# Patient Record
Sex: Female | Born: 1955 | Race: Black or African American | Hispanic: No | Marital: Single | Smoking: Never smoker
Health system: Southern US, Community
[De-identification: ages and names within clinical notes are randomized; demographics above are authoritative.]

## PROBLEM LIST (undated history)

## (undated) DIAGNOSIS — I499 Cardiac arrhythmia, unspecified: Secondary | ICD-10-CM

## (undated) DIAGNOSIS — J45909 Unspecified asthma, uncomplicated: Secondary | ICD-10-CM

## (undated) DIAGNOSIS — I509 Heart failure, unspecified: Secondary | ICD-10-CM

## (undated) DIAGNOSIS — F329 Major depressive disorder, single episode, unspecified: Secondary | ICD-10-CM

## (undated) DIAGNOSIS — M1990 Unspecified osteoarthritis, unspecified site: Secondary | ICD-10-CM

## (undated) DIAGNOSIS — J189 Pneumonia, unspecified organism: Secondary | ICD-10-CM

## (undated) DIAGNOSIS — R569 Unspecified convulsions: Secondary | ICD-10-CM

## (undated) DIAGNOSIS — Z9981 Dependence on supplemental oxygen: Secondary | ICD-10-CM

## (undated) DIAGNOSIS — F32A Depression, unspecified: Secondary | ICD-10-CM

## (undated) DIAGNOSIS — K219 Gastro-esophageal reflux disease without esophagitis: Secondary | ICD-10-CM

## (undated) DIAGNOSIS — R0600 Dyspnea, unspecified: Secondary | ICD-10-CM

## (undated) DIAGNOSIS — J449 Chronic obstructive pulmonary disease, unspecified: Secondary | ICD-10-CM

## (undated) HISTORY — PX: JOINT REPLACEMENT: SHX530

---

## 2001-01-04 ENCOUNTER — Emergency Department (HOSPITAL_COMMUNITY): Admission: EM | Admit: 2001-01-04 | Discharge: 2001-01-04 | Payer: Self-pay | Admitting: Internal Medicine

## 2001-02-04 ENCOUNTER — Emergency Department (HOSPITAL_COMMUNITY): Admission: EM | Admit: 2001-02-04 | Discharge: 2001-02-04 | Payer: Self-pay | Admitting: Emergency Medicine

## 2001-02-04 ENCOUNTER — Encounter: Payer: Self-pay | Admitting: Emergency Medicine

## 2001-04-22 DIAGNOSIS — R569 Unspecified convulsions: Secondary | ICD-10-CM

## 2001-04-22 HISTORY — DX: Unspecified convulsions: R56.9

## 2003-07-10 ENCOUNTER — Emergency Department (HOSPITAL_COMMUNITY): Admission: EM | Admit: 2003-07-10 | Discharge: 2003-07-11 | Payer: Self-pay | Admitting: Emergency Medicine

## 2003-07-27 ENCOUNTER — Inpatient Hospital Stay (HOSPITAL_COMMUNITY): Admission: EM | Admit: 2003-07-27 | Discharge: 2003-07-30 | Payer: Self-pay | Admitting: Emergency Medicine

## 2004-03-16 ENCOUNTER — Emergency Department (HOSPITAL_COMMUNITY): Admission: EM | Admit: 2004-03-16 | Discharge: 2004-03-16 | Payer: Self-pay | Admitting: Emergency Medicine

## 2004-05-19 ENCOUNTER — Emergency Department (HOSPITAL_COMMUNITY): Admission: EM | Admit: 2004-05-19 | Discharge: 2004-05-19 | Payer: Self-pay | Admitting: Emergency Medicine

## 2004-06-01 ENCOUNTER — Emergency Department (HOSPITAL_COMMUNITY): Admission: EM | Admit: 2004-06-01 | Discharge: 2004-06-01 | Payer: Self-pay | Admitting: *Deleted

## 2004-06-24 ENCOUNTER — Emergency Department (HOSPITAL_COMMUNITY): Admission: EM | Admit: 2004-06-24 | Discharge: 2004-06-24 | Payer: Self-pay | Admitting: Emergency Medicine

## 2004-07-12 ENCOUNTER — Emergency Department (HOSPITAL_COMMUNITY): Admission: EM | Admit: 2004-07-12 | Discharge: 2004-07-12 | Payer: Self-pay | Admitting: *Deleted

## 2004-07-13 ENCOUNTER — Emergency Department (HOSPITAL_COMMUNITY): Admission: EM | Admit: 2004-07-13 | Discharge: 2004-07-13 | Payer: Self-pay | Admitting: Emergency Medicine

## 2004-07-20 ENCOUNTER — Ambulatory Visit: Payer: Self-pay | Admitting: Pulmonary Disease

## 2004-07-24 ENCOUNTER — Ambulatory Visit: Payer: Self-pay | Admitting: Pulmonary Disease

## 2004-08-24 ENCOUNTER — Ambulatory Visit: Payer: Self-pay | Admitting: Pulmonary Disease

## 2004-09-14 ENCOUNTER — Ambulatory Visit: Payer: Self-pay | Admitting: Pulmonary Disease

## 2004-10-04 ENCOUNTER — Ambulatory Visit: Payer: Self-pay | Admitting: Pulmonary Disease

## 2004-10-27 ENCOUNTER — Emergency Department (HOSPITAL_COMMUNITY): Admission: EM | Admit: 2004-10-27 | Discharge: 2004-10-27 | Payer: Self-pay | Admitting: Emergency Medicine

## 2004-12-27 ENCOUNTER — Ambulatory Visit: Payer: Self-pay | Admitting: Critical Care Medicine

## 2005-01-05 ENCOUNTER — Inpatient Hospital Stay (HOSPITAL_COMMUNITY): Admission: EM | Admit: 2005-01-05 | Discharge: 2005-01-08 | Payer: Self-pay | Admitting: Emergency Medicine

## 2005-01-06 ENCOUNTER — Ambulatory Visit: Payer: Self-pay | Admitting: Pulmonary Disease

## 2005-01-22 ENCOUNTER — Ambulatory Visit: Payer: Self-pay | Admitting: Pulmonary Disease

## 2005-03-23 ENCOUNTER — Emergency Department (HOSPITAL_COMMUNITY): Admission: EM | Admit: 2005-03-23 | Discharge: 2005-03-24 | Payer: Self-pay | Admitting: Emergency Medicine

## 2005-03-27 ENCOUNTER — Ambulatory Visit: Payer: Self-pay | Admitting: Pulmonary Disease

## 2005-03-31 ENCOUNTER — Emergency Department (HOSPITAL_COMMUNITY): Admission: EM | Admit: 2005-03-31 | Discharge: 2005-04-01 | Payer: Self-pay | Admitting: Emergency Medicine

## 2005-05-14 ENCOUNTER — Ambulatory Visit: Payer: Self-pay | Admitting: Pulmonary Disease

## 2005-05-23 ENCOUNTER — Ambulatory Visit: Payer: Self-pay | Admitting: Pulmonary Disease

## 2005-06-15 ENCOUNTER — Emergency Department (HOSPITAL_COMMUNITY): Admission: EM | Admit: 2005-06-15 | Discharge: 2005-06-15 | Payer: Self-pay | Admitting: Emergency Medicine

## 2005-06-16 ENCOUNTER — Inpatient Hospital Stay (HOSPITAL_COMMUNITY): Admission: EM | Admit: 2005-06-16 | Discharge: 2005-06-22 | Payer: Self-pay | Admitting: Emergency Medicine

## 2005-06-16 ENCOUNTER — Ambulatory Visit: Payer: Self-pay | Admitting: Pulmonary Disease

## 2005-06-27 ENCOUNTER — Ambulatory Visit: Payer: Self-pay | Admitting: Internal Medicine

## 2005-07-15 ENCOUNTER — Ambulatory Visit: Payer: Self-pay | Admitting: Internal Medicine

## 2005-08-30 ENCOUNTER — Ambulatory Visit: Payer: Self-pay | Admitting: Pulmonary Disease

## 2005-09-11 ENCOUNTER — Emergency Department (HOSPITAL_COMMUNITY): Admission: EM | Admit: 2005-09-11 | Discharge: 2005-09-11 | Payer: Self-pay | Admitting: Emergency Medicine

## 2005-09-25 ENCOUNTER — Emergency Department (HOSPITAL_COMMUNITY): Admission: EM | Admit: 2005-09-25 | Discharge: 2005-09-25 | Payer: Self-pay | Admitting: Emergency Medicine

## 2005-10-10 ENCOUNTER — Ambulatory Visit: Payer: Self-pay | Admitting: Internal Medicine

## 2006-02-09 ENCOUNTER — Emergency Department (HOSPITAL_COMMUNITY): Admission: EM | Admit: 2006-02-09 | Discharge: 2006-02-10 | Payer: Self-pay | Admitting: Emergency Medicine

## 2008-02-02 ENCOUNTER — Emergency Department (HOSPITAL_BASED_OUTPATIENT_CLINIC_OR_DEPARTMENT_OTHER): Admission: EM | Admit: 2008-02-02 | Discharge: 2008-02-02 | Payer: Self-pay | Admitting: Emergency Medicine

## 2010-09-07 NOTE — Letter (Signed)
November 21, 2005     [name]  [address]  [place], [place] [address]   RE:  Sonia Drake, Sonia Drake  MRN:  [id number]  /  DOB:  [birth year]   Dear [name]:   My records indicate that you have failed to keep multiple appointments with  this office including on 11/20/2005.  We had reserved time to both see you  in the office and to perform lung function tests to sort out your  respiratory problems.   The error may be on our part, and if so, please forget this letter.   However, I need you to make another appointment to see me in the office  within the next 4 weeks with all of your medicines in your hands.  Bring in  all of your medicines in hand so I can review them with you and discuss your  treatment plan.  If you are unable to do this for any reason please contact  my office directly, otherwise at the end of 30 days I will be forced to  discharge you from my practice to release [name] from responsibility for your  care due a repeat pattern of making and missing appointments without  notifying us and concern that I have that you are not adhering to the  medical plan that we had outlined for you.   Sincerely,      Casimiro Needle B. Sherene Sires, MD, [facility]   MBW/MedQ  DD:  11/21/2005  DT:  11/21/2005  Job #:  [id number]

## 2010-09-07 NOTE — Letter (Signed)
January 09, 2006     Sonia Drake  [address]  [place], [place]  [address]   RE:  Sonia Drake, Sonia Drake  MRN:  [id number]  /  DOB:  1956-03-08   Dear [name]:   My records indicate that you failed again to keep the appointment that you  made to see me on January 08, 2006.  You had already received a letter  from me dated August 2 explaining how concerned I was about this issue and  therefore I have no choice but at this point to discharge you from the  practice and to release [name] from responsibility for your care.  We will be  available for you for the next 30 days for emergency purposes only.  I would  strongly recommend that you see one of the other asthma or allergy  specialists in this community, namely either Dr. Illene Bolus practice  (which is not part of the [facility]), Dr. Corine Shelter, or  Dr. Lenn Cal, who are all seeing asthma patients and would be happy to  help you.  However, I strongly encourage you to keep the appointments and to  take all of your medicines with you when you go to those appointments for  the doctor to review.  I am sorry we could not work closer together to solve  your problem.    Sincerely,      Casimiro Needle B. Sherene Sires, MD, [facility]   MBW/MedQ  DD:  01/09/2006  DT:  01/10/2006  Job #:  [id number]

## 2010-09-07 NOTE — Discharge Summary (Signed)
NAME:  Sonia Drake, Sonia Drake                          ACCOUNT NO.:  [id number]   MEDICAL RECORD NO.:  [id number]                   PATIENT TYPE:  INP   LOCATION:  0340                                 FACILITY:  [facility]   PHYSICIAN:  Hollice Espy, M.D.            DATE OF BIRTH:  28-May-1955   DATE OF ADMISSION:  07/27/2003  DATE OF DISCHARGE:  07/30/2003                                 DISCHARGE SUMMARY   CONSULTANTS ON THIS CASE:  Dr. Sung Amabile, pulmonary/critical care.   PRIMARY CARE PHYSICIAN:  Dr. Charlean Merl, [place], [place].   DISCHARGE DIAGNOSES:  1. Asthma exacerbation.  2. History of congestive heart failure which is stable during this     hospitalization.  3. Positive Trichomonas in her urine.  4. Steroid-induced hyperglycemia.   DISCHARGE MEDICATIONS:  1. Albuterol MDI 2 puffs q.i.d.  2. Advair 500/50, 2 puffs b.i.d.  3. Singulair 10 mg p.o. daily.  4. Advil p.r.n.  5. Allegra.  6. The patient is being advised to stop her Accolate.  7. She will also be on a prednisone taper 50 mg p.o. b.i.d. x 2 days, then     40 mg p.o. b.i.d. x 3 days, and 20 mg p.o. b.i.d. x 3 days, then 10 mg     p.o. daily x 3 days, then stop.   HISTORY OF PRESENT ILLNESS:  This is a 55 year old white female with history  of multiple asthma exacerbations, at times requiring intubation, who  presented on July 27, 2003, with increasing shortness of breath after  exposure to an oven cleaner with lots of dust.  She continues to have very  tight breathing despite taking double doses of her albuterol inhaler.  She  keeps track of her asthma symptoms and was noted to have a peak flow which  is usually around 350-400, is only 75.  She came into the emergency room.  There, she was found to have 02 saturations of 1% on 2 liters but a very  fatigued woman with a high respiratory rate.  Chest x-ray showed no acute  disease, but she was not moving much air.  She was started on IV steroids,  magnesium  sulfate, nebulizers, and O2 and started feeling a little bit  better.  BNP and chest x-ray as well as cardiac enzymes were all found to be  negative.  It was felt that this was not from congestive heart failure.  She  remained relatively stable with __________.  UA showed a small amount of  leukocyte esterase and positive for Trichomonas, although patient did state  that she was not sexually active.  The patient was started on __________ for  this.   HOSPITAL COURSE:  #1 - ASTHMA EXACERBATION:  Dr. Westley Hummer pulmonary consult  saw the patient and felt that her bronchospasm was improving.  He  recommended decreasing her steroids as well as frequency of bronchodilators  from q.2h. down to  q.6h.  He further recommended should be on Singular or  Accolate, not both, and recommended continuing prednisone taper as well as  Advair 500/50.  He said that he would be happy to follow up with the patient  as an outpatient and provided his office number.  The patient continues to  improve.  She was having some problems with audible wheezing and dyspnea on  exertion with removal of oxygen.  She was continued on steroids and by July 29, 2003, she was feeling much better with close to her baseline.  By July 30, 2003, she was able to do well with a 94% O2 saturation on room air when  ambulating and 99% when not.  She otherwise was doing well.  Plan for that  will be to continue her albuterol and Advair inhaler.  She will continue her  rapid prednisone taper, and she will follow up with her PCP as well as Dr.  Sung Amabile in the outpatient setting.  In regard to her Trichomonas, she was  just started on Flagyl of which during her hospital stay she received a  total of 3-1/2 doses.  The plan will be for her to continue to take 1 more  week's course.   #2 - CONGESTIVE HEART FAILURE:  This was a stable medical issue and required  no intervention.  The patient otherwise was felt to be medically stable for   discharge on July 30, 2003.  She will be discharged to home.  She will be  discharged on a cardiac rehab diet.  She will follow up with her PCP in 2-4  weeks as well as Dr. Sung Amabile in the next 2-3 weeks.  Numbers have been  provided for this patient.  Her activity will be no heavy exertional  activity until see by her doctors.                                               Hollice Espy, M.D.    SKK/MEDQ  D:  07/30/2003  T:  07/30/2003  Job:  [id number]   cc:   Oley Balm. Sung Amabile, M.D. [facility]

## 2010-09-07 NOTE — H&P (Signed)
Sonia Drake, Sonia Drake                ACCOUNT NO.:  [id number]   MEDICAL RECORD NO.:  [id number]          PATIENT TYPE:  INP   LOCATION:  1311                         FACILITY:  [facility]   PHYSICIAN:  Lonzo Cloud. Kriste Basque, M.D. [facility] OF BIRTH:  January 18, 1956   DATE OF ADMISSION:  06/16/2005  DATE OF DISCHARGE:                                HISTORY & PHYSICAL   HISTORY:  The patient is a 55 year old black female, patient of Dr. Sung Amabile,  with severe asthma who has been difficult to control. She states that she  had a GI virus about one week ago with nausea, vomiting and diarrhea. She  treated herself with fluids and over-the-counter medications and gradually  improved. She notes that right after that I caught a cold. She complained  of increasing congestion, cough with beige mucus and marked increase  wheezing and dyspnea. With these symptoms, she presented to the emergency  room on the 24th and improved after IV Solu-Medrol and several breathing  treatments. She was sent home with increased dose of prednisone but states  that she was worse last night and returned to the emergency room today for  admission. She notes possible low grade fever with some chills. Denies  sweats. She has had no hemoptysis. She denies chest pain, other than the  discomfort from her dyspnea. She has had some increased sinus symptoms  lately and some reflux symptoms as well.   PAST MEDICAL HISTORY:  1.  Asthma: She has a history of asthma with multiple triggers. She has been      intubated twice in the past. She is a nonsmoker.  2.  Vocal cord dysfunction.  3.  History of sinusitis with a CT scan September of 2006 showing acute and      chronic sinus disease with an air-fluid level on the left.  4.  Gastroesophageal reflux disease.  5.  History of allergies.   MEDICATIONS:  1.  Nebulizer treatments with DuoNeb q.i.d.  2.  Advair 500/50 1 puff b.i.d.  3.  Singulair 10 milligrams p.o. daily.  4.  Clarinex versus  Allegra 1 daily.  5.  Nasonex at bed time.  6.  She is not presently on a PPI.   ALLERGIES:  She states she is allergic to EGGS and SEAFOOD.   FAMILY HISTORY:  There is a strong family history of asthma. History is also  positive for diabetes and coronary artery disease.   SOCIAL HISTORY:  The patient does not smoke or drink. She has one daughter  with asthma and one son who is healthy. She is separated and formerly worked  as a substitute now on disability.   PHYSICAL EXAMINATION:  GENERAL: Physical exam revealed a 55 year old black  female in mild distress from dyspnea.  VITAL SIGNS: Blood pressure 130/72, pulse 110 and regular, respirations 24  per minute and shallow. O2 saturation 90% and improved on nasal O2.  Temperature 99 degrees.  HEENT: Reveals slight pharyngeal erythema but no exudate seen. There is  moderate nasal congestion and some discomfort.  NECK: No jugular venous distention, no carotid bruits, no  thyromegaly or  lymphadenopathy.  CHEST: Marked inspiratory and expiratory wheezes and rhonchi bilaterally. No  signs of consolidation.  CARDIAC: A regular rhythm, tachycardia, a grade 1/6 systolic ejection murmur  left sternal border without rubs or gallops heard.  ABDOMEN: Soft with minimal epigastric tenderness on palpation. No evidence  of organomegaly or masses. Bowel sounds intact.  EXTREMITIES: Showed no clubbing, cyanosis, or edema.  NEUROLOGICAL: Intact without focal abnormalities detected.   IMPRESSION:  Severe asthma with status asthmaticus, unresponsive to  outpatient management. We will admit her for inhaled and IV therapy. Please  see orders.      Lonzo Cloud. Kriste Basque, M.D. [facility]  Electronically Signed     SMN/MEDQ  D:  06/16/2005  T:  06/17/2005  Job:  [id number]   cc:   Oley Balm. Sung Amabile, M.D. [facility]  [address]  [place]  [place] [address]   Patient's chart

## 2010-09-07 NOTE — H&P (Signed)
NAME:  Sonia Drake, Sonia Drake                ACCOUNT NO.:  [id number]   MEDICAL RECORD NO.:  [id number]          PATIENT TYPE:  INP   LOCATION:  0103                         FACILITY:  [facility]   PHYSICIAN:  Leslye Peer, M.D.  DATE OF BIRTH:  04/20/1956   DATE OF ADMISSION:  01/05/2005  DATE OF DISCHARGE:                                HISTORY & PHYSICAL   CHIEF COMPLAINT:  Shortness of breath and wheezing.   HISTORY OF PRESENT ILLNESS:  Sonia Drake is a pleasant 55 year old woman  with a history of asthma, nasal allergies, and gastroesophageal reflux  disease who is followed at [facility] by Dr. Sung Amabile. She states that  she was well until approximately 3 days ago, when she developed upper  respiratory symptoms after she was caught out in the rain. These included  significant sore throat, post-nasal drip, body aches, and sweats. She did  not have any overt fevers. She has been having headache as well. She states  that she has been using her albuterol with increased frequency. Typically,  she uses it 2 to 3 times daily. For the last 2 to 3 days, she has been using  it 5 or 6 times daily. She has also needed to wake up at night to use her  bronchodilators. She presented to the emergency department at [facility] for evaluation today. Chest x-ray was performed, which  showed no evidence of infiltrate. She had diffuse wheezing and upper airway  noise on examination. She was treated with corticosteroids and  bronchodilators and her wheezing improved somewhat but her upper airway  noise persists. She is admitted now for exacerbation of her asthma.   PAST MEDICAL HISTORY:  1.  Asthma with endotracheal intubation x2.  2.  Gastroesophageal reflux disease.  3.  Nasal allergies.   ALLERGIES:  SEAFOOD, EGGS.   MEDICATIONS:  1.  Advair 500/50 1 inhalation b.i.d.  2.  Singulair 10 mg q.d.  3.  Clarinex 5 mg p.o. q.d.  4.  Albuterol meter dose inhaler 2 puffs q. 4 hours  p.r.n. for shortness of      breath.   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  She is a never smoker. She does not use alcohol or intravenous drugs.   FAMILY HISTORY:  Significant for many first degree family members with  asthma. Also significant for diabetes mellitus and coronary artery disease.   PHYSICAL EXAMINATION:  VITAL SIGNS:  Temperature 97.9, blood pressure  118/71, heart rate 82, respiratory rate 20. SPO2 98% on 2 liters per minute  by nasal cannula.  GENERAL:  This is a very pleasant woman who is comfortable lying in the  emergency department on 2 liters per minute by nasal cannula.  HEENT:  Oropharynx moist. She has some posterior pharyngeal erythema. There  is expiratory stridor on auscultation of the neck.  LUNGS:  Clear on inspiration. She has bilateral expiratory wheezes plus  possible referred upper airway noise.  HEART:  Regular with a normal S1 and S2. No murmur.  ABDOMEN:  Obese,  soft, non-tender with positive bowel sounds.  EXTREMITIES:  No clubbing, cyanosis, or edema.  NEUROLOGIC:  Alert and oriented times three. Has a non-focal examination.   LABORATORY DATA:  White blood cell count 6.2. Hematocrit 38. Platelets  271,000. Sodium 139, potassium 3.6, chloride 106, CO2 26, BUN 9, creatinine  0.8, glucose 110, calcium 9.6.   Chest x-ray in the emergency department showed normal heart shadow and clear  lungs with no infiltrates or effusions.   IMPRESSION:  1.  Asthma with an exacerbation secondary to a probable upper respiratory      infection.  2.  Superimposed vocal cord dysfunction.  3.  Allergic rhinitis.  4.  Gastroesophageal reflux disease.   PLAN:  Sonia Drake will be admitted to a regular medicine bed. We will  initiate intravenous steroids tonight and then convert her to prednisone in  the morning. Will continue scheduled albuterol nebulizer and therapy q. 4  hours. We will start her Advair at her normal dose  tomorrow. We will  continue her Claritin, Singulair as ordered and will consider adding a nasal  steroid if her post nasal drip persists. Will also start therapy for  gastroesophageal reflux disease with a proton pump inhibitor. Finally, we  will obtain a rapid strep test, given her significant pharyngitis and  posterior pharyngeal erythema.           ______________________________  Leslye Peer, M.D.     RSB/MEDQ  D:  01/05/2005  T:  01/05/2005  Job:  [id number]   cc:   Oley Balm. Sung Amabile, M.D. [facility]  [address]  [place]  [place] [address]

## 2010-09-07 NOTE — Discharge Summary (Signed)
Sonia Drake, Sonia Drake                ACCOUNT NO.:  [id number]   MEDICAL RECORD NO.:  [id number]          PATIENT TYPE:  INP   LOCATION:  1621                         FACILITY:  [facility]   PHYSICIAN:  Oley Balm. Sung Amabile, M.D. [facility] OF BIRTH:  June 18, 1955   DATE OF ADMISSION:  01/05/2005  DATE OF DISCHARGE:                                 DISCHARGE SUMMARY   DISCHARGE DIAGNOSES:  1.  Asthmatic exacerbation.  2.  Gastroesophageal reflux disease/vocal cord dysfunction.  3.  Acute-on-chronic sinusitis.   LABORATORY DATA:  Obtained on day of admission, January 05, 2005:  White  blood cells 6.2, hematocrit 38, platelets 271. Sodium 139, potassium 3.6,  chloride 106, CO2 26, BUN 9, creatinine 0.8, glucose 110. Chest x-ray  obtained on day of admission without infiltrate, effusions, or acute  disease. Rapid strep test negative.   DIAGNOSTICS:  CT of sinuses was obtained on January 07, 2005 demonstrating  both acute and chronic sinusitis.   BRIEF HISTORY:  Sonia Drake is a pleasant 55 year old woman with a history  of asthma, nasal allergies, and gastroesophageal reflux disease who is  followed by Dr. Sung Amabile in the outpatient setting. She said she was in her  usual state of health until approximately 3 days prior to admission at which  time she developed upper respiratory symptoms after being caught in the  rain. She includes significant sore throat, postnatal drip, body aches, and  sweats. She did not have any overt fever. She has been having headaches as  well. She states that she had been using her rescue albuterol with increased  frequency. She typically reports poor asthmatic control with up to two- to  three-times-daily requirements of rescue albuterol. For the days prior to  admission, she was using her rescue albuterol up to five to six times. She  was also waking up in the middle of the night needing her bronchodilators.  Of note, she typically reports nocturnal dyspnea during the  nighttime hours  which is certainly questionable for poorly controlled reflux disease. She  was admitted to the hospital under the attending physician of Dr. Delton Coombes.  Initial workup was evaluated.   HOSPITAL COURSE BY DISCHARGE DIAGNOSIS:  #1 - ASTHMATIC EXACERBATION. Ms.  Drake was admitted to the medical ward at [facility]. Diagnostic  tests including chest x-rays and rapid strep antigen were performed,  demonstrating no acute disease. She was treated initially with IV Solu-  Medrol and tapered down to p.o. prednisone. Additionally, she received  aggressive bronchodilators, supplemental oxygen, and step-up of her  gastroesophageal reflux disease management. She responded well to therapy.  She was weaned to oral prednisone on September 17, weaned off from oxygen by  September 18. Her peak flows continued to improve over the course of her  hospitalization, initially in the low 200s, up to mid 300s upon time of  discharge. She reports her personal best being around the 400s. Upon time of  discharge she has no wheezes upon auscultation and reports that she is  ambulating in the hall without dyspnea and is close to baseline.   #2 -  GASTROESOPHAGEAL REFLUX DISEASE, VOCAL CORD DYSFUNCTION. The patient  was initially on 40 mg of Protonix daily for maintenance of gastroesophageal  reflux disease. It should be noted that she does report baseline symptoms of  nocturnal dyspnea which may be questionable for still poorly controlled  reflux disease. She has currently been stepped up to 80 mg of Protonix for  the next 2 weeks, especially while she is on higher dose prednisone. This  will need to be reevaluated on an outpatient basis as to either change of  PPI regimen, or add Reglan for promotility agent. Again, appears well  controlled on this current regimen.   #3 - ACUTE-ON-CHRONIC SINUSITIS. Upon further evaluation during inpatient  stay, the patient does report significant postnasal  drip, her voice is  hoarse, and she still complained of occasional headache and sinus pressure.  Because of this, a CT of sinus was obtained on January 07, 2005 under the  direction of Dr. Danice Goltz. Findings were positive for chronic and  acute sinusitis. Based on these findings an aggressive nasal hygiene regimen  was ordered, as well as a 7-day course of aggressive antibiotic therapy.   DISCHARGE INSTRUCTIONS:  1.  Diet as tolerated.  2.  Activity as tolerated.  3.  Medications:      1.  Advair 500/50 mcg inhaled twice a day.      2.  Singulair 10 mg tablet daily.      3.  Protonix 40 mg (two tablets to total 80 mg) x2 weeks, then 40 mg          daily.      4.  Prednisone 50 mg x1 day, then 40 mg x3 days, then 20 mg x3 days,          then discontinue.      5.  Saline nasal spray two puffs each nostril four times a day, followed          by Nasonex two sprays each nostril in the morning daily. She was          instructed to continue the Nasonex daily and continue the saline          nasal spray regimen for 7 days.      6.  Levaquin 750 mg tablets one p.o. daily x7 days.      7.  Albuterol nebulizer/MDI as needed.   FOLLOW-UP:  Dr. Billy Fischer, January 22, 2005 at 9:45 a.m. No other  referrals were required.   PHYSICAL EXAMINATION UPON TIME OF DISCHARGE:  GENERAL:  Oxygen saturations  94-97% on room air. She is afebrile, her vital signs are stable. She does  complain of moderate PND. She has no adenopathy or JVD.  HEART TONES:  Regular and without murmur, rub, or gallop.  BREATH SOUNDS:  Clear upon auscultation.  ABDOMEN:  Soft, nontender.  EXTREMITIES:  Without edema. She does have 2+ pulses.   DISPOSITION:  The patient stable and ready for discharge. Follow-up has been  obtained. Discharge instructions were reviewed in detail by myself. The  patient is ready for discharge.      Anders Simmonds, N.P. [facility]    ______________________________  Oley Balm Sung Amabile, M.D.  [facility]    PB/MEDQ  D:  01/08/2005  T:  01/08/2005  Job:  [id number]

## 2010-09-07 NOTE — Discharge Summary (Signed)
NAME:  Sonia Drake, Sonia Drake                ACCOUNT NO.:  [id number]   MEDICAL RECORD NO.:  [id number]          PATIENT TYPE:  INP   LOCATION:  1311                         FACILITY:  [facility]   PHYSICIAN:  Casimiro Needle B. Sherene Sires, M.D. [facility] OF BIRTH:  [birth year]   DATE OF ADMISSION:  06/16/2005  DATE OF DISCHARGE:                                 DISCHARGE SUMMARY   DISCHARGE DIAGNOSES:  1.  Asthmatic bronchitis flare in the setting of poorly-controlled asthma.  2.  Acute sinusitis, most likely precipitating asthmatic bronchitis event.  3.  Gastroesophageal reflux disease and vocal cord dysfunction.   LABORATORY DATA:  June 21, 2005:  Occult blood, fecal, negative. June 16, 2005:  TSH 0.387. June 16, 2005:  Sodium 138, potassium 3.9,  chloride 106, CO2 25, glucose 175, BUN 5, creatinine 0.9. June 16, 2005:  White blood cells 7.4, hemoglobin 12.7, hematocrit 38, platelets 249.   RADIOLOGY:  June 17, 2005 - CT of sinus, impression:  Overall, no  significant improvement of acute-on-chronic sinus disease. June 16, 2005  - chest x-ray, impression:  No definite nodule. Small left pleural effusion.   BRIEF HISTORY:  This is a 55 year old African-American patient of Dr.  Sung Amabile with a history of severe asthma with difficulty controlling  symptoms. She states that she had recently had a GI virus about a week ago  prior to admission with nausea, vomiting, and diarrhea. She treated herself  with fluids and over-the-counter medications and gradually improved. Right  after this episode she noted that she caught a cold. She complained of  increasing cough, congestion, beige-colored mucus, and markedly-increased  wheezing. Additionally complained of severe nasal congestion and increased  nasal discharge as well as headache. She presented to the emergency room on  February 24 and initially improved after an IV Medrol and several breathing  treatments. She was sent home with increased dose of  prednisone; however,  the symptomatology got worse. She presented the following day on February 25  with possible low-grade fever and some chills. She had no hemoptysis, no  chest pain, and only complaint was dyspnea, sinus-like symptoms, as well as  some reflux symptoms as well.   PAST MEDICAL HISTORY:  1.  Asthma with multiple triggers. She has had two prior intubations in the      past. She is a nonsmoker.  2.  Vocal cord dysfunction.  3.  History of sinusitis.  4.  Gastroesophageal reflux disease.  5.  Several environmental allergies.   HOSPITAL COURSE BY DISCHARGE DIAGNOSIS:  #1 - ACUTE-ON-CHRONIC SINUSITIS.  Identified by clinical symptoms and verified by CT of sinuses obtained on  June 17, 2005. She was initiated on Avelox on June 16, 2005. This  was transitioned to Augmentin on June 18, 2005. She will be sent home to  complete 8 more days of antimicrobial therapy for acute sinusitis. This will  complete 14 days of therapy. Currently, her symptoms are markedly improved  and she has been afebrile.   #2 - ASTHMATIC BRONCHITIS FLARE IN SETTING OF DIFFICULT-TO-CONTROL ASTHMA.  Most likely exacerbated by acute-on-chronic sinusitis. She has  been treated  in the usual fashion with inhaled bronchodilators, IV systemic steroids, and  empiric antibiotics. There have been some changes in her bronchodilator  regimen by Dr. Sherene Sires. She will be changed from Advair and Singulair to Qvar,  Foradil, and Singulair for her maintenance regimen. She will continue to use  albuterol in the outpatient setting for rescue relief. Her symptomatology  has continued to improve. She has been weaned off supplemental oxygen. She  still has a diffuse expiratory wheeze upon physical exam at time of  discharge.   #3 - GASTROESOPHAGEAL REFLUX DISEASE. This has been difficult to control in  the outpatient setting. She will be sent home on b.i.d. Protonix, which she  already has several samples of at  home. When these samples run off she will  begin a Nexium prescription consisting of Nexium 40 mg daily. She states her  reflux symptoms are currently controlled.   #4 - COMPLICATED MEDICAL REGIMEN. Ms. Barbone has a very complicated  respiratory and medical regimen. There is a large discrepancy in her medical  records and the medication she states she takes at home. Given the number of  hospitalizations she has had over the last year, this presents again a high  risk for re-hospitalization. Therefore, she has been scheduled in the  outpatient setting with Tammy Parrett, [facility] nurse practitioner,  for extensive medication counseling and medication calendar. She was advised  that she should bring every one of her medications that she takes to this  follow-up appointment, at which time a medication list will be provided for  her.   DIET:  As tolerated.   ACTIVITY:  Increase as tolerated.   MEDICATIONS:  1.  Mucinex two tablets twice a day.  2.  Singulair 10 mg daily.  3.  Augmentin 875 mg one tablet twice a day for 8 more days.  4.  Prednisone six tablets x4 days, then four tablets x4 days, then two      tablets x4 days, then stop.  5.  Foradil 12 mcg capsule one inhaled twice a day.  6.  Nasonex two sprays via nostril twice a day.  7.  Saline nasal spray one spray each nostril four times a day.  8.  Qvar 80 mcg inhaled two puffs twice a day.  9.  Protonix one tablet twice a day until gone, then she will being Nexium      40 mg daily.  10. Albuterol MDI as needed for shortness of breath.   FOLLOW-UP:  Rubye Oaks, nurse practitioner at [facility], on June 26, 2005. Further follow-up with Dr. Sherene Sires on July 03, 2005.   PHYSICAL EXAMINATION UPON TIME OF DISCHARGE:  VITAL SIGNS:  Afebrile. Room  air saturations 94-96%. Heart rate 90s, blood pressure 109/67, respirations  16 to 24. EARS, NOSE, THROAT:  No JVD, no nasal discharge, mild upper airway wheeze.   PULMONARY:  Significant diffuse expiratory wheezes which worsen with any  sort of activity.  CARDIAC:  Regular rate and rhythm.  EXTREMITIES:  No edema, warm to palpation, 2+ pulses.  GENITOURINARY:  Voids spontaneously.  ABDOMEN:  Soft, nontender, positive bowel sounds. There was some question as  to whether or not she was having bloody stools. A fecal occult blood sample  was sent to the laboratory and was negative. She was counseled that should  these symptoms return she should mention them and call her primary care  physician, or at this time share them with Dr. Sherene Sires as  she does not have a  primary care physician. She will need routine health maintenance colonoscopy  in the outpatient setting in the near future.  NEUROLOGIC:  Grossly intact.   DISPOSITION:  The patient will be discharged to home on June 22, 2005, if  she tolerates decreased dose of steroids. She will then be sent home on a  steroid taper, 8 more days of antibiotics, aggressive nasal hygiene, and  very close follow-up in the outpatient setting.      Anders Simmonds, N.P. [facility]    ______________________________  Charlaine Dalton. Sherene Sires, M.D. [facility]    PB/MEDQ  D:  06/21/2005  T:  06/21/2005  Job:  [id number]   cc:   Rubye Oaks, NP [facility]  [address]  [place], [place] [address]

## 2010-09-07 NOTE — H&P (Signed)
NAME:  SHEREE, LALLA                          ACCOUNT NO.:  [id number]   MEDICAL RECORD NO.:  [id number]                   PATIENT TYPE:  INP   LOCATION:  0340                                 FACILITY:  [facility]   PHYSICIAN:  Melissa L. Ladona Ridgel, MD               DATE OF BIRTH:  Jan 14, 1956   DATE OF ADMISSION:  07/27/2003  DATE OF DISCHARGE:                                HISTORY & PHYSICAL   PRIMARY CARE PHYSICIAN:  Dr. Charlean Merl, [place], [place].   CHIEF COMPLAINT:  Shortness of breath.   The patient is a 55 year old white female who on Saturday was trying to  clean her home since her housekeeper was not available.  She was exposed to  oven cleaner and a lot of dust, and it is at that time that she developed  severe attacks of her asthma.  She became very short of breath and started  taking her nebulizers on a regular basis.  Over the course of the weekend  she continued to have very tight breathing and was actually taking double  doses of her Ventolin.  On Sunday she noticed that her ankles were swollen,  her eyes were swollen, she was weak, her shortness of breath was becoming  worse, as was her wheezing.  By Monday the symptoms worsened and the  frequency of the attacks became more protracted and increased in number, and  she just felt terribly devoid of strength.  Her peak flows at the time were  75.  Usually her peak flows are 350-400.  She denies, fever, chills, nausea  or vomiting to this, and she has no diarrhea.  So she came to the emergency  room when she just felt that this was out of control for her.  In the  emergency room she was treated with continuous nebulizers, magnesium  sulfate, with some resolution of her symptomatology.   REVIEW OF SYSTEMS:  As above.  All else are negative.   PAST MEDICAL HISTORY:  Significant for asthma with three intubations, last  being in 2000.  She does have a history of CHF although has never seen a  cardiologist.   PAST  SURGICAL HISTORY:  She has none.   SOCIAL HISTORY:  She does not smoke.  She does not drink.  She has one  daughter with asthma.  She has one son who is healthy, and she is separated.  She works as a Lawyer.   ALLERGIES:  No known drug allergies, although she cannot eat eggs, seafood,  or have the flu shot because of the egg content.   MEDICATIONS:  1. Singulair 10 mg daily.  2. Albuterol MDI and nebulizers.  3. Advair two puffs.  4. Accolate unknown dose.  5. Allegra.  6. Advil p.r.n.   PHYSICAL EXAMINATION:  VITAL SIGNS:  On admission, temperature was 97.7,  blood pressure of 106/49, pulse of 83, respiratory rate of  22, saturations  were 100% on 2 L.  GENERAL:  She is a very fatigued African-American female in mild distress  with regard to her speech secondary to dyspnea.  HEENT:  She is normocephalic, atraumatic.  Pupils equal, round, and reactive  to light, extraocular muscles are intact.  Mucous membranes are moist.  There is no posterior pharyngitis.  Her TMs are intact but dull with serous  bulla.  Her turbinates are not boggy.  NECK:  Supple.  There is no JVD, no lymph nodes, and no thyromegaly.  CHEST:  There are decreased breath sounds with positive end-expiratory  wheezing.  She is very tight and her chest is quiet.  CARDIOVASCULAR:  There is regular rate and rhythm with distant heart sounds.  She has positive S1, S2, and I cannot appreciate any murmurs, rubs, or  gallops.  ABDOMEN:  Soft, nontender, nondistended, with positive bowel sounds.  EXTREMITIES:  No edema, with 2+ pulses.  NEUROLOGIC:  She is awake, alert, oriented x3.  Cranial nerves II-XII are  intact.  Power is 5/5.   LABORATORY DATA:  White count of 7.4, hemoglobin of 13.3, hematocrit of  40.5, and platelets of 266.  Her BUN is 5, her creatinine is 0.7.  Chest x-  ray shows no acute disease.  Her EKG was normal sinus rhythm with no ST-T  wave changes.  LFTs are within normal limits.  The UA  shows small leukocyte  esterase with only 0-2 white blood cells, and she had positive Trichomonas.   ASSESSMENT AND PLAN:  1. This is a 55 year old African-American female with a history of asthma     that has been exacerbated x4-5 days with possible allergy-related     triggers.  Her peak flows at home had been 75.  She is now responding to     continuous nebulizers, magnesium sulfate, and IV steroids.  The plan is     to admit the patient to step-down with q.2h. nebulizers, continue her IV     steroids, her Allegra, order incentive spirometry, and  pulmonary consult     has been called.  The patient currently does not have a primary are     physician nor a pulmonologist that she is following with, and therefore     will assist with establishing care as well as assuring that she is     receiving maximum therapy here in the hospital.  2. Cardiovascular:  She has a history of congestive heart failure, and will     check a BNP and cardiac enzymes.  Will consider checking a 2 D echo     during hospitalization or defer to an outpatient follow-up.  3. Gastrointestinal:  Will start Protonix while on steroids.  4. Genitourinary:  The patient has positive Trichomonas in her urine.  This     has been discussed with the patient, who states that she has not been     sexually active x1 year.  We will ask the lab to recheck this value and     if she again is Trichomonas-positive, we will start her on metronidazole.  5. Endocrine:  There are no current issues, but we will follow her CBGs     while she is on steroids.  Melissa L. Ladona Ridgel, MD    MLT/MEDQ  D:  07/28/2003  T:  07/28/2003  Job:  [id number]   cc:   Oley Balm. Sung Amabile, M.D. [facility]

## 2014-05-25 ENCOUNTER — Other Ambulatory Visit: Payer: Self-pay | Admitting: Family Medicine

## 2014-05-26 ENCOUNTER — Other Ambulatory Visit: Payer: Self-pay | Admitting: Family Medicine

## 2014-06-28 ENCOUNTER — Emergency Department (HOSPITAL_COMMUNITY): Payer: Medicaid Other

## 2014-06-28 ENCOUNTER — Inpatient Hospital Stay (HOSPITAL_COMMUNITY)
Admission: EM | Admit: 2014-06-28 | Discharge: 2014-06-30 | DRG: 191 | Disposition: A | Discharge status: Home / Self Care | Payer: Medicaid Other | Attending: Internal Medicine | Admitting: Internal Medicine

## 2014-06-28 ENCOUNTER — Encounter (HOSPITAL_COMMUNITY): Payer: Self-pay | Admitting: Emergency Medicine

## 2014-06-28 DIAGNOSIS — Z8249 Family history of ischemic heart disease and other diseases of the circulatory system: Secondary | ICD-10-CM

## 2014-06-28 DIAGNOSIS — R9431 Abnormal electrocardiogram [ECG] [EKG]: Secondary | ICD-10-CM | POA: Diagnosis present

## 2014-06-28 DIAGNOSIS — Z825 Family history of asthma and other chronic lower respiratory diseases: Secondary | ICD-10-CM

## 2014-06-28 DIAGNOSIS — I4581 Long QT syndrome: Secondary | ICD-10-CM | POA: Diagnosis present

## 2014-06-28 DIAGNOSIS — J45901 Unspecified asthma with (acute) exacerbation: Secondary | ICD-10-CM | POA: Diagnosis present

## 2014-06-28 DIAGNOSIS — R000 Tachycardia, unspecified: Secondary | ICD-10-CM | POA: Diagnosis present

## 2014-06-28 DIAGNOSIS — Z79899 Other long term (current) drug therapy: Secondary | ICD-10-CM

## 2014-06-28 DIAGNOSIS — J441 Chronic obstructive pulmonary disease with (acute) exacerbation: Principal | ICD-10-CM

## 2014-06-28 DIAGNOSIS — Z7951 Long term (current) use of inhaled steroids: Secondary | ICD-10-CM

## 2014-06-28 HISTORY — DX: Chronic obstructive pulmonary disease, unspecified: J44.9

## 2014-06-28 LAB — BRAIN NATRIURETIC PEPTIDE: B Natriuretic Peptide: 28.7 pg/mL (ref 0.0–100.0)

## 2014-06-28 LAB — BASIC METABOLIC PANEL
Anion gap: 7 (ref 5–15)
BUN: 7 mg/dL (ref 6–23)
CO2: 30 mmol/L (ref 19–32)
Calcium: 10.1 mg/dL (ref 8.4–10.5)
Chloride: 103 mmol/L (ref 96–112)
Creatinine, Ser: 0.93 mg/dL (ref 0.50–1.10)
GFR calc Af Amer: 76 mL/min — ABNORMAL LOW (ref >90)
GFR calc non Af Amer: 65 mL/min — ABNORMAL LOW (ref >90)
Glucose, Bld: 95 mg/dL (ref 70–99)
Potassium: 4.1 mmol/L (ref 3.5–5.1)
Sodium: 140 mmol/L (ref 135–145)

## 2014-06-28 LAB — CBC
HCT: 42.8 % (ref 36.0–46.0)
Hemoglobin: 13.2 g/dL (ref 12.0–15.0)
MCH: 28.6 pg (ref 26.0–34.0)
MCHC: 30.8 g/dL (ref 30.0–36.0)
MCV: 92.6 fL (ref 78.0–100.0)
Platelets: 225 10*3/uL (ref 150–400)
RBC: 4.62 MIL/uL (ref 3.87–5.11)
RDW: 14.1 % (ref 11.5–15.5)
WBC: 8.1 10*3/uL (ref 4.0–10.5)

## 2014-06-28 LAB — I-STAT TROPONIN, ED: Troponin i, poc: 0 ng/mL (ref 0.00–0.08)

## 2014-06-28 MED ORDER — METHYLPREDNISOLONE SODIUM SUCC 125 MG IJ SOLR
125.0000 mg | Freq: Once | INTRAMUSCULAR | Status: DC
Start: 2014-06-28 — End: 2014-06-28

## 2014-06-28 MED ORDER — CEFTRIAXONE SODIUM 1 G IJ SOLR
1.0000 g | Freq: Once | INTRAMUSCULAR | Status: AC
Start: 2014-06-28 — End: 2014-06-29
  Administered 2014-06-28: 1 g via INTRAVENOUS
  Filled 2014-06-28: qty 10

## 2014-06-28 MED ORDER — DIPHENHYDRAMINE HCL 50 MG/ML IJ SOLN: 25.0000 mg | Freq: Once | INTRAMUSCULAR | Status: DC

## 2014-06-28 MED ORDER — ALBUTEROL SULFATE (2.5 MG/3ML) 0.083% IN NEBU
2.5000 mg | INHALATION_SOLUTION | Freq: Once | RESPIRATORY_TRACT | Status: AC
  Administered 2014-06-28: 2.5 mg via RESPIRATORY_TRACT
  Filled 2014-06-28: qty 3

## 2014-06-28 MED ORDER — ALBUTEROL (5 MG/ML) CONTINUOUS INHALATION SOLN
10.0000 mg/h | INHALATION_SOLUTION | Freq: Once | RESPIRATORY_TRACT | Status: AC
  Administered 2014-06-28: 10 mg/h via RESPIRATORY_TRACT
  Filled 2014-06-28: qty 20

## 2014-06-28 MED ORDER — METHYLPREDNISOLONE SODIUM SUCC 125 MG IJ SOLR
125.0000 mg | Freq: Once | INTRAMUSCULAR | Status: AC
  Administered 2014-06-28: 125 mg via INTRAVENOUS
  Filled 2014-06-28: qty 2

## 2014-06-28 MED ORDER — AZITHROMYCIN 250 MG PO TABS
500.0000 mg | ORAL_TABLET | Freq: Once | ORAL | Status: AC
  Administered 2014-06-28: 500 mg via ORAL
  Filled 2014-06-28: qty 2

## 2014-06-28 MED ORDER — DIPHENHYDRAMINE HCL 50 MG/ML IJ SOLN
INTRAMUSCULAR | Status: AC
  Administered 2014-06-28: 25 mg
  Filled 2014-06-28: qty 1

## 2014-06-28 NOTE — ED Notes (Addendum)
Pt presents with shortness of breath onset last night, audible wheezing, short sentences. Hx of COPD, pt ate meal last night that was prepared around lobster

## 2014-06-28 NOTE — ED Provider Notes (Signed)
CSN: [id number]     Arrival date & time 06/28/14  2042 History   First MD Initiated Contact with Patient 06/28/14 2044     Chief Complaint  Patient presents with  . Shortness of Breath   HPI Patient presents to emergency room with complaints of shortness of breath. The patient states her symptoms started last evening. She is not sure if it was related to possible shellfish exposure. She was out to dinner at a [employer] restaurant. One of the people that she was eating with had seafood. Patient states the meal she ate was cooked on the same hibachi table.  She started to feel short of breath last evening. She used her epinephrine pens. Symptoms persisted throughout the day. She's having difficulty speaking and breathing. She decided coming to the emergency room. She denies any trouble with chest pain or leg pain. No fevers or chills. No throat swelling. Past Medical History  Diagnosis Date  . COPD (chronic obstructive pulmonary disease)    History reviewed. No pertinent past surgical history. No family history on file. History  Substance Use Topics  . Smoking status: Not on file  . Smokeless tobacco: Not on file  . Alcohol Use: Not on file   OB History    No data available     Review of Systems  All other systems reviewed and are negative.     Allergies  Contrast media; Peanuts; Shellfish allergy; and Soap  Home Medications   Prior to Admission medications   Medication Sig Start Date End Date Taking? Authorizing Provider  albuterol (PROVENTIL HFA;VENTOLIN HFA) 108 (90 BASE) MCG/ACT inhaler Inhale into the lungs every 6 (six) hours as needed for wheezing or shortness of breath.   Yes Historical Provider, MD  albuterol (PROVENTIL) (2.5 MG/3ML) 0.083% nebulizer solution Take 2.5 mg by nebulization every 4 (four) hours as needed for wheezing or shortness of breath.   Yes Historical Provider, MD  AZELASTINE HCL NA Place 1 spray into the nose 2 (two) times daily.   Yes Historical Provider,  MD  budesonide-formoterol (SYMBICORT) 160-4.5 MCG/ACT inhaler Inhale 2 puffs into the lungs 2 (two) times daily.   Yes Historical Provider, MD  calcium-vitamin D (OSCAL WITH D) 500-200 MG-UNIT per tablet Take 1 tablet by mouth daily.   Yes Historical Provider, MD  cetirizine (ZYRTEC) 10 MG tablet Take 10 mg by mouth every evening.    Yes Historical Provider, MD  montelukast (SINGULAIR) 10 MG tablet Take 10 mg by mouth at bedtime.   Yes Historical Provider, MD  tiotropium (SPIRIVA) 18 MCG inhalation capsule Place 18 mcg into inhaler and inhale daily.   Yes Historical Provider, MD   BP 125/68 mmHg  Pulse 137  Temp(Src) 98.2 F (36.8 C) (Oral)  Resp 28  Ht 5\' 4"  (1.626 m)  Wt 187 lb (84.823 kg)  BMI 32.08 kg/m2  SpO2 94% Physical Exam  Constitutional: She appears well-developed and well-nourished.  HENT:  Head: Normocephalic and atraumatic.  Right Ear: External ear normal.  Left Ear: External ear normal.  Mouth/Throat: No trismus in the jaw. No uvula swelling. No oropharyngeal exudate.  Eyes: Conjunctivae are normal. Right eye exhibits no discharge. Left eye exhibits no discharge. No scleral icterus.  Neck: Neck supple. No tracheal deviation present.  Cardiovascular: Regular rhythm and intact distal pulses.  Tachycardia present.   Pulmonary/Chest: Accessory muscle usage present. No stridor. No respiratory distress. She has wheezes. She has no rales.  Difficulty speaking more than a few words  Abdominal:  Soft. Bowel sounds are normal. She exhibits no distension. There is no tenderness. There is no rebound and no guarding.  Musculoskeletal: She exhibits no edema or tenderness.  Neurological: She is alert. She has normal strength. No cranial nerve deficit (no facial droop, extraocular movements intact, no slurred speech) or sensory deficit. She exhibits normal muscle tone. She displays no seizure activity. Coordination normal.  Skin: Skin is warm and dry. No rash noted. Rash is not  urticarial. She is not diaphoretic.  Psychiatric: She has a normal mood and affect.  Nursing note and vitals reviewed.   ED Course  Procedures (including critical care time) Labs Review Labs Reviewed  BASIC METABOLIC PANEL - Abnormal; Notable for the following:    GFR calc non Af Amer 65 (*)    GFR calc Af Amer 76 (*)    All other components within normal limits  CBC  BRAIN NATRIURETIC PEPTIDE  INFLUENZA PANEL BY PCR (TYPE A & B, H1N1)  I-STAT TROPOININ, ED    Imaging Review Dg Chest Port 1 View  06/28/2014   CLINICAL DATA:  Cough, congestion, shortness of breath and chest pain for 2 days.  EXAM: PORTABLE CHEST - 1 VIEW  COMPARISON:  Frontal and lateral views earlier this day at 1930 hour at [facility].  FINDINGS: Mild elevation of left hemidiaphragm. The bibasilar densities on prior exam are best appreciated on the lateral view, not well seen currently. Minimal bibasilar atelectasis suspected. The heart size is normal. Pulmonary vasculature is normal. No pleural effusion or pneumothorax. No acute osseous abnormalities.  IMPRESSION: Bibasilar atelectasis. This was better appreciated on exam 2 hours prior on the lateral view.   Electronically Signed   By: Rubye Oaks M.D.   On: 06/28/2014 21:54     EKG Interpretation   Date/Time:  Tuesday June 28 2014 20:46:34 EST Ventricular Rate:  137 PR Interval:  91 QRS Duration: 98 QT Interval:  402 QTC Calculation: 607 R Axis:   85 Text Interpretation:  Sinus tachycardia , rate faster since last tracing  Consider right ventricular hypertrophy Repol abnrm suggests ischemia,  diffuse leads , new since last tracing Prolonged QT interval Confirmed by  Brittin Janik  MD-J, Clete Kuch ([id number]) on 06/28/2014 8:51:08 PM     Medications  albuterol (PROVENTIL) (2.5 MG/3ML) 0.083% nebulizer solution 2.5 mg (2.5 mg Nebulization Given 06/28/14 2051)  diphenhydrAMINE (BENADRYL) 50 MG/ML injection (25 mg  Given 06/28/14 2051)  methylPREDNISolone sodium  succinate (SOLU-MEDROL) 125 mg/2 mL injection 125 mg (125 mg Intravenous Given 06/28/14 2055)  albuterol (PROVENTIL,VENTOLIN) solution continuous neb (10 mg/hr Nebulization Given 06/28/14 2135)  azithromycin (ZITHROMAX) tablet 500 mg (500 mg Oral Given 06/28/14 2313)  cefTRIAXone (ROCEPHIN) 1 g in dextrose 5 % 50 mL IVPB (1 g Intravenous New Bag/Given 06/28/14 2314)    MDM   Final diagnoses:  Chronic obstructive pulmonary disease with acute exacerbation   Sx improved with treatment.  She is able to speak more easily however she is still wheezing significantly and is tachycardic.  It is possible her symptoms were triggered by an allergic reaction but this could be a copd exacerbation as well.  Will continue with breathing treatments and steroids.  Plan on admission for further treatment.    Linwood Dibbles, MD 06/29/14 0000

## 2014-06-29 ENCOUNTER — Encounter (HOSPITAL_COMMUNITY): Payer: Self-pay | Admitting: *Deleted

## 2014-06-29 DIAGNOSIS — J441 Chronic obstructive pulmonary disease with (acute) exacerbation: Secondary | ICD-10-CM | POA: Diagnosis present

## 2014-06-29 DIAGNOSIS — J45901 Unspecified asthma with (acute) exacerbation: Secondary | ICD-10-CM | POA: Diagnosis present

## 2014-06-29 DIAGNOSIS — R9431 Abnormal electrocardiogram [ECG] [EKG]: Secondary | ICD-10-CM | POA: Diagnosis present

## 2014-06-29 DIAGNOSIS — I4581 Long QT syndrome: Secondary | ICD-10-CM

## 2014-06-29 DIAGNOSIS — R000 Tachycardia, unspecified: Secondary | ICD-10-CM | POA: Diagnosis present

## 2014-06-29 DIAGNOSIS — Z79899 Other long term (current) drug therapy: Secondary | ICD-10-CM | POA: Diagnosis not present

## 2014-06-29 DIAGNOSIS — Z8249 Family history of ischemic heart disease and other diseases of the circulatory system: Secondary | ICD-10-CM | POA: Diagnosis not present

## 2014-06-29 DIAGNOSIS — Z7951 Long term (current) use of inhaled steroids: Secondary | ICD-10-CM | POA: Diagnosis not present

## 2014-06-29 DIAGNOSIS — Z825 Family history of asthma and other chronic lower respiratory diseases: Secondary | ICD-10-CM | POA: Diagnosis not present

## 2014-06-29 DIAGNOSIS — R0602 Shortness of breath: Secondary | ICD-10-CM | POA: Diagnosis present

## 2014-06-29 DIAGNOSIS — R0600 Dyspnea, unspecified: Secondary | ICD-10-CM

## 2014-06-29 LAB — CBC WITH DIFFERENTIAL/PLATELET
Basophils Absolute: 0 10*3/uL (ref 0.0–0.1)
Basophils Relative: 0 % (ref 0–1)
Eosinophils Absolute: 0 10*3/uL (ref 0.0–0.7)
Eosinophils Relative: 0 % (ref 0–5)
HCT: 38.1 % (ref 36.0–46.0)
Hemoglobin: 11.8 g/dL — ABNORMAL LOW (ref 12.0–15.0)
Lymphocytes Relative: 8 % — ABNORMAL LOW (ref 12–46)
Lymphs Abs: 0.5 10*3/uL — ABNORMAL LOW (ref 0.7–4.0)
MCH: 28.4 pg (ref 26.0–34.0)
MCHC: 31 g/dL (ref 30.0–36.0)
MCV: 91.6 fL (ref 78.0–100.0)
Monocytes Absolute: 0.1 10*3/uL (ref 0.1–1.0)
Monocytes Relative: 2 % — ABNORMAL LOW (ref 3–12)
Neutro Abs: 5.7 10*3/uL (ref 1.7–7.7)
Neutrophils Relative %: 90 % — ABNORMAL HIGH (ref 43–77)
Platelets: 225 10*3/uL (ref 150–400)
RBC: 4.16 MIL/uL (ref 3.87–5.11)
RDW: 14.4 % (ref 11.5–15.5)
WBC: 6.3 10*3/uL (ref 4.0–10.5)

## 2014-06-29 LAB — INFLUENZA PANEL BY PCR (TYPE A & B)
H1N1 flu by pcr: NOT DETECTED
Influenza A By PCR: NEGATIVE
Influenza B By PCR: NEGATIVE

## 2014-06-29 LAB — BASIC METABOLIC PANEL
Anion gap: 10 (ref 5–15)
BUN: 9 mg/dL (ref 6–23)
CO2: 25 mmol/L (ref 19–32)
Calcium: 9.4 mg/dL (ref 8.4–10.5)
Chloride: 100 mmol/L (ref 96–112)
Creatinine, Ser: 0.85 mg/dL (ref 0.50–1.10)
GFR calc Af Amer: 85 mL/min — ABNORMAL LOW (ref >90)
GFR calc non Af Amer: 73 mL/min — ABNORMAL LOW (ref >90)
Glucose, Bld: 218 mg/dL — ABNORMAL HIGH (ref 70–99)
Potassium: 3.8 mmol/L (ref 3.5–5.1)
Sodium: 135 mmol/L (ref 135–145)

## 2014-06-29 LAB — MAGNESIUM: Magnesium: 1.7 mg/dL (ref 1.5–2.5)

## 2014-06-29 LAB — TROPONIN I
Troponin I: <0.03 ng/mL (ref <0.031)
Troponin I: <0.03 ng/mL (ref <0.031)

## 2014-06-29 LAB — STREP PNEUMONIAE URINARY ANTIGEN: Strep Pneumo Urinary Antigen: NEGATIVE

## 2014-06-29 MED ORDER — ONDANSETRON HCL 4 MG/2ML IJ SOLN
4.0000 mg | Freq: Four times a day (QID) | INTRAMUSCULAR | Status: DC | PRN
  Administered 2014-06-29 (×3): 4 mg via INTRAVENOUS
  Filled 2014-06-29 (×3): qty 2

## 2014-06-29 MED ORDER — IPRATROPIUM-ALBUTEROL 0.5-2.5 (3) MG/3ML IN SOLN
3.0000 mL | RESPIRATORY_TRACT | Status: DC
  Administered 2014-06-29 (×4): 3 mL via RESPIRATORY_TRACT
  Filled 2014-06-29 (×4): qty 3

## 2014-06-29 MED ORDER — HEPARIN SODIUM (PORCINE) 5000 UNIT/ML IJ SOLN
5000.0000 [IU] | Freq: Three times a day (TID) | INTRAMUSCULAR | Status: DC
  Administered 2014-06-29 – 2014-06-30 (×3): 5000 [IU] via SUBCUTANEOUS
  Filled 2014-06-29 (×4): qty 1

## 2014-06-29 MED ORDER — CALCIUM CARBONATE-VITAMIN D 500-200 MG-UNIT PO TABS
1.0000 | ORAL_TABLET | Freq: Every day | ORAL | Status: DC
  Administered 2014-06-29 – 2014-06-30 (×2): 1 via ORAL
  Filled 2014-06-29 (×2): qty 1

## 2014-06-29 MED ORDER — LORATADINE 10 MG PO TABS
10.0000 mg | ORAL_TABLET | Freq: Every day | ORAL | Status: DC
  Administered 2014-06-29 – 2014-06-30 (×2): 10 mg via ORAL
  Filled 2014-06-29 (×2): qty 1

## 2014-06-29 MED ORDER — CETYLPYRIDINIUM CHLORIDE 0.05 % MT LIQD
7.0000 mL | Freq: Two times a day (BID) | OROMUCOSAL | Status: DC
  Administered 2014-06-29 – 2014-06-30 (×3): 7 mL via OROMUCOSAL

## 2014-06-29 MED ORDER — DM-GUAIFENESIN ER 30-600 MG PO TB12
1.0000 | ORAL_TABLET | Freq: Two times a day (BID) | ORAL | Status: DC
  Administered 2014-06-29 – 2014-06-30 (×4): 1 via ORAL
  Filled 2014-06-29 (×4): qty 1

## 2014-06-29 MED ORDER — IPRATROPIUM-ALBUTEROL 0.5-2.5 (3) MG/3ML IN SOLN
3.0000 mL | Freq: Four times a day (QID) | RESPIRATORY_TRACT | Status: DC
  Administered 2014-06-30 (×3): 3 mL via RESPIRATORY_TRACT
  Filled 2014-06-29 (×3): qty 3

## 2014-06-29 MED ORDER — ALBUTEROL SULFATE (2.5 MG/3ML) 0.083% IN NEBU: 2.5000 mg | INHALATION_SOLUTION | RESPIRATORY_TRACT | Status: DC | PRN

## 2014-06-29 MED ORDER — LEVOFLOXACIN 750 MG PO TABS
750.0000 mg | ORAL_TABLET | Freq: Every day | ORAL | Status: DC
  Administered 2014-06-29 (×2): 750 mg via ORAL
  Filled 2014-06-29 (×2): qty 1

## 2014-06-29 MED ORDER — METHYLPREDNISOLONE SODIUM SUCC 125 MG IJ SOLR
60.0000 mg | Freq: Three times a day (TID) | INTRAMUSCULAR | Status: DC
  Administered 2014-06-29 – 2014-06-30 (×5): 60 mg via INTRAVENOUS
  Filled 2014-06-29 (×5): qty 2

## 2014-06-29 MED ORDER — MAGNESIUM SULFATE 2 GM/50ML IV SOLN
2.0000 g | Freq: Once | INTRAVENOUS | Status: AC
  Administered 2014-06-29: 2 g via INTRAVENOUS
  Filled 2014-06-29: qty 50

## 2014-06-29 MED ORDER — AZELASTINE HCL 0.1 % NA SOLN
1.0000 | Freq: Two times a day (BID) | NASAL | Status: DC
  Administered 2014-06-29 – 2014-06-30 (×3): 1 via NASAL
  Filled 2014-06-29: qty 30

## 2014-06-29 MED ORDER — ALBUTEROL SULFATE (2.5 MG/3ML) 0.083% IN NEBU
2.5000 mg | INHALATION_SOLUTION | RESPIRATORY_TRACT | Status: DC | PRN
  Administered 2014-06-29 (×2): 2.5 mg via RESPIRATORY_TRACT
  Filled 2014-06-29 (×2): qty 3

## 2014-06-29 MED ORDER — MONTELUKAST SODIUM 10 MG PO TABS
10.0000 mg | ORAL_TABLET | Freq: Every day | ORAL | Status: DC
  Administered 2014-06-29 (×2): 10 mg via ORAL
  Filled 2014-06-29 (×2): qty 1

## 2014-06-29 NOTE — H&P (Addendum)
[facility] History and Physical  Sonia Drake ZOX:[id number] DOB: 1955/05/12 DOA: 06/28/2014  Referring physician: ED physician PCP: Verlon Au, MD  Specialists:   Chief Complaint: Productive cough and shortness of breath  HPI: Sonia Drake is a 59 y.o. female with past medical history of COPD, who presents with productive cough and shortness of breath.  Patient reports that she started having shortness of breath since yesterday. She is not sure if it was related to possible shellfish exposure. She was out to dinner at a [employer]. One of the people that she was eating with had seafood. Patient states the meal she ate was cooked on the same hibachi table. She started to feel short of breath last evening. She has a productive cough with greenish colored sputum production and mild chest discomfort. she has subjective fever and chills. She was evaluated in [facility] emergency room yesterday afternoon. She had negative chest x-ray there, and was discharged home without new treatment.   Patient denies runny nose or sore throat, abdominal pain, diarrhea, constipation, dysuria, urgency, frequency, hematuria, skin rashes, joint pain or leg swelling. No unilateral weakness, numbness or tingling sensations. No vision change or hearing loss.  In ED, patient was found to have negative chest x-ray for pneumonia. No leukocytosis. BNP 28.7. Troponin negative. Temperature 98.2. Tachycardia. EKG showed prolongation of QTc interval 607 with T-wave inversion in inferior lead III and aVF. Patient is admitted to inpatient for further evaluation treatment.  Review of Systems: As presented in the history of presenting illness, rest negative.  Where does patient live?  At home Can patient participate in ADLs? Yes  Allergy:  Allergies  Allergen Reactions  . Contrast Media [Iodinated Diagnostic Agents]     Cardiac arrest  . Peanuts [Peanut Oil] Shortness Of Breath and  Swelling  . Shellfish Allergy Shortness Of Breath and Swelling  . Soap     Laundry detergent-swelling/shortness of breath.    Past Medical History  Diagnosis Date  . COPD (chronic obstructive pulmonary disease)     History reviewed. No pertinent past surgical history.  Social History:  has no tobacco, alcohol, and drug history on file.  Family History:  Family History  Problem Relation Age of Onset  . Heart disease Mother   . Heart disease Father   . Diabetes Father   . Asthma Father   . Diabetes Brother   . Diabetes Sister   . Asthma Brother   . Asthma Sister   . Hypertension Sister      Prior to Admission medications   Medication Sig Start Date End Date Taking? Authorizing Provider  albuterol (PROVENTIL HFA;VENTOLIN HFA) 108 (90 BASE) MCG/ACT inhaler Inhale into the lungs every 6 (six) hours as needed for wheezing or shortness of breath.   Yes Historical Provider, MD  albuterol (PROVENTIL) (2.5 MG/3ML) 0.083% nebulizer solution Take 2.5 mg by nebulization every 4 (four) hours as needed for wheezing or shortness of breath.   Yes Historical Provider, MD  AZELASTINE HCL NA Place 1 spray into the nose 2 (two) times daily.   Yes Historical Provider, MD  budesonide-formoterol (SYMBICORT) 160-4.5 MCG/ACT inhaler Inhale 2 puffs into the lungs 2 (two) times daily.   Yes Historical Provider, MD  calcium-vitamin D (OSCAL WITH D) 500-200 MG-UNIT per tablet Take 1 tablet by mouth daily.   Yes Historical Provider, MD  cetirizine (ZYRTEC) 10 MG tablet Take 10 mg by mouth every evening.    Yes Historical Provider, MD  montelukast (SINGULAIR) 10 MG tablet Take 10 mg by mouth at bedtime.   Yes Historical Provider, MD  tiotropium (SPIRIVA) 18 MCG inhalation capsule Place 18 mcg into inhaler and inhale daily.   Yes Historical Provider, MD    Physical Exam: Filed Vitals:   06/28/14 2046 06/28/14 2135 06/29/14 0059 06/29/14 0100  BP: 125/68  123/58   Pulse: 137  114   Temp:   98.4 F (36.9  C)   TempSrc:   Oral   Resp: 28     Height:     (1.626 m)  Weight:    92.6 kg (204 lb 2.3 oz)  SpO2: 95% 94%  93%   General: Not in acute distress HEENT:       Eyes: PERRL, EOMI, no scleral icterus       ENT: No discharge from the ears and nose, no pharynx injection, no tonsillar enlargement.        Neck: No JVD, no bruit, no mass felt. Cardiac: S1/S2, RRR, No murmurs, No gallops or rubs Pulm: Diffuse wheezing bilaterally  Abd: Soft, nondistended, nontender, no rebound pain, no organomegaly, BS present Ext: No edema bilaterally. 2+DP/PT pulse bilaterally Musculoskeletal: No joint deformities, erythema, or stiffness, ROM full Skin: No rashes.  Neuro: Alert and oriented X3, cranial nerves II-XII grossly intact, muscle strength 5/5 in all extremeties, sensation to light touch intact.  Psych: Patient is not psychotic, no suicidal or hemocidal ideation.  Labs on Admission:  Basic Metabolic Panel:  Recent Labs Lab 06/28/14 2148  NA 140  K 4.1  CL 103  CO2 30  GLUCOSE 95  BUN 7  CREATININE 0.93  CALCIUM 10.1   Liver Function Tests: No results for input(s): AST, ALT, ALKPHOS, BILITOT, PROT, ALBUMIN in the last 168 hours. No results for input(s): LIPASE, AMYLASE in the last 168 hours. No results for input(s): AMMONIA in the last 168 hours. CBC:  Recent Labs Lab 06/28/14 2148  WBC 8.1  HGB 13.2  HCT 42.8  MCV 92.6  PLT 225   Cardiac Enzymes:  Recent Labs Lab 06/29/14 0210  TROPONINI <0.03    BNP (last 3 results)  Recent Labs  06/28/14 2149  BNP 28.7    ProBNP (last 3 results) No results for input(s): PROBNP in the last 8760 hours.  CBG: No results for input(s): GLUCAP in the last 168 hours.  Radiological Exams on Admission: Dg Chest Port 1 View  06/28/2014   CLINICAL DATA:  Cough, congestion, shortness of breath and chest pain for 2 days.  EXAM: PORTABLE CHEST - 1 VIEW  COMPARISON:  Frontal and lateral views earlier this day at 1930 hour at  [facility].  FINDINGS: Mild elevation of left hemidiaphragm. The bibasilar densities on prior exam are best appreciated on the lateral view, not well seen currently. Minimal bibasilar atelectasis suspected. The heart size is normal. Pulmonary vasculature is normal. No pleural effusion or pneumothorax. No acute osseous abnormalities.  IMPRESSION: Bibasilar atelectasis. This was better appreciated on exam 2 hours prior on the lateral view.   Electronically Signed   By: Rubye Oaks M.D.   On: 06/28/2014 21:54    EKG: Independently reviewed. prolongation of QTc interval 607 with T-wave inversion in inferior lead III and aVF, no old EKG to compared with  Assessment/Plan Principal Problem:   COPD exacerbation Active Problems:   Prolonged QT interval  COPD exacerbation: Patient's symptoms are most likely caused by COPD exacerbation given productive cough and shortness of breath, and diffused  wheezing. No infiltration on chest x-ray.  -will admit to tele bed -Nebulizers: scheduled Duoneb and prn albuterol -Solu-Medrol 60 mg IV q8h  -Oral Levaquin for 5 days.  -Mucinex for cough  -Urine legionella and S. pneumococcal antigen -Follow up blood culture x2, sputum culture, respiratory virus panel, Flu pcr -Continue home montelukast  QT prolongation: Unclear etiology. Patient also has T-wave inversion in inferior leads.  -Troponin 3 -Repeat EKG morning   DVT ppx: SQ Heparin    Code Status: Full code Family Communication: None at bed side.   Disposition Plan: Admit to inpatient   Date of Service 06/29/2014    Lorretta HarpIU, Verlene Glantz [facility] Pager [phone]  If 7PM-7AM, please contact night-coverage [email or web address] Password [email or web address] 06/29/2014, 4:38 AM

## 2014-06-29 NOTE — Progress Notes (Signed)
I have seen and assessed patient and agree with Dr Evorn GongNiu's assessment and plan. Will check a 2 d echo secondary to EKG changes. Repeat EKG in morning.

## 2014-06-29 NOTE — Progress Notes (Signed)
ANTIBIOTIC CONSULT NOTE - INITIAL  Pharmacy Consult for Antibiotic renal dose adjustment  Indication: COPD  Allergies  Allergen Reactions  . Contrast Media [Iodinated Diagnostic Agents]     Cardiac arrest  . Peanuts [Peanut Oil] Shortness Of Breath and Swelling  . Shellfish Allergy Shortness Of Breath and Swelling  . Soap     Laundry detergent-swelling/shortness of breath.    Patient Measurements: Height: 5\' 4"  (162.6 cm) Weight: 204 lb 2.3 oz (92.6 kg) IBW/kg (Calculated) : 54.7 Adjusted Body Weight:   Vital Signs: Temp: 98.4 F (36.9 C) (03/09 0500) Temp Source: Oral (03/09 0500) BP: 108/52 mmHg (03/09 0500) Pulse Rate: 99 (03/09 0500) Intake/Output from previous day: 03/08 0701 - 03/09 0700 In: -  Out: 550 [Urine:550] Intake/Output from this shift: Total I/O In: -  Out: 550 [Urine:550]  Labs:  Recent Labs  06/28/14 2148  WBC 8.1  HGB 13.2  PLT 225  CREATININE 0.93   Estimated Creatinine Clearance: 71 mL/min (by C-G formula based on Cr of 0.93). No results for input(s): VANCOTROUGH, VANCOPEAK, VANCORANDOM, GENTTROUGH, GENTPEAK, GENTRANDOM, TOBRATROUGH, TOBRAPEAK, TOBRARND, AMIKACINPEAK, AMIKACINTROU, AMIKACIN in the last 72 hours.   Microbiology: No results found for this or any previous visit (from the past 720 hour(s)).  Medical History: Past Medical History  Diagnosis Date  . COPD (chronic obstructive pulmonary disease)     Medications:  Anti-infectives    Start     Dose/Rate Route Frequency Ordered Stop   06/29/14 0115  levofloxacin (LEVAQUIN) tablet 750 mg     750 mg Oral Daily at bedtime 06/29/14 0055     06/28/14 2245  azithromycin (ZITHROMAX) tablet 500 mg     500 mg Oral  Once 06/28/14 2241 06/28/14 2313   06/28/14 2245  cefTRIAXone (ROCEPHIN) 1 g in dextrose 5 % 50 mL IVPB     1 g 100 mL/hr over 30 Minutes Intravenous  Once 06/28/14 2241 06/29/14 0003     Assessment: Patient with COPD exacerbation.  Patient's renal function is >50  mL/min.  Goal of Therapy:  Levofloxacin dosed based on patient weight and renal function   Plan:  Follow up culture results  Continue with levofloxacin 750mg  po q24hr  Darlina GuysGrimsley Jr, Mckenzye Cutright Crowford 06/29/2014,5:08 AM

## 2014-06-29 NOTE — Progress Notes (Signed)
  Echocardiogram 2D Echocardiogram has been performed.  Arvil ChacoFoster, Alivea Gladson 06/29/2014, 4:10 PM

## 2014-06-29 NOTE — Progress Notes (Signed)
CARE MANAGEMENT NOTE 06/29/2014  Patient:  Niel HummerSANDERS,Isidora J   Account Number:  [id number]  Date Initiated:  06/29/2014  Documentation initiated by:  Nimrod Wendt  Subjective/Objective Assessment:   copd exacerbation     Action/Plan:   home when stable   Anticipated DC Date:  07/02/2014   Anticipated DC Plan:  HOME/SELF CARE  In-house referral  NA      DC Planning Services  NA      [facility]   Choice offered to / List presented to:  NA   DME arranged  NA      DME agency  NA     HH arranged  NA      HH agency  NA   Status of service:  In process, will continue to follow Medicare Important Message given?   (If response is "NO", the following Medicare IM given date fields will be blank) Date Medicare IM given:   Medicare IM given by:   Date Additional Medicare IM given:   Additional Medicare IM given by:    Discharge Disposition:    Per UR Regulation:  Reviewed for med. necessity/level of care/duration of stay  If discussed at Long Length of Stay Meetings, dates discussed:    Comments:  June 29, 2014/Kierstyn Baranowski L. Earlene Plateravis, RN, BSN, CCM. Case Management [employer] [phone] No discharge needs present of time of review.

## 2014-06-30 DIAGNOSIS — J441 Chronic obstructive pulmonary disease with (acute) exacerbation: Secondary | ICD-10-CM | POA: Insufficient documentation

## 2014-06-30 LAB — BASIC METABOLIC PANEL
Anion gap: 7 (ref 5–15)
BUN: 10 mg/dL (ref 6–23)
CO2: 29 mmol/L (ref 19–32)
Calcium: 10.5 mg/dL (ref 8.4–10.5)
Chloride: 104 mmol/L (ref 96–112)
Creatinine, Ser: 0.74 mg/dL (ref 0.50–1.10)
GFR calc Af Amer: >90 mL/min (ref >90)
GFR calc non Af Amer: >90 mL/min (ref >90)
Glucose, Bld: 171 mg/dL — ABNORMAL HIGH (ref 70–99)
Potassium: 4.8 mmol/L (ref 3.5–5.1)
Sodium: 140 mmol/L (ref 135–145)

## 2014-06-30 LAB — CBC
HCT: 41 % (ref 36.0–46.0)
Hemoglobin: 13 g/dL (ref 12.0–15.0)
MCH: 28.9 pg (ref 26.0–34.0)
MCHC: 31.7 g/dL (ref 30.0–36.0)
MCV: 91.1 fL (ref 78.0–100.0)
Platelets: 240 10*3/uL (ref 150–400)
RBC: 4.5 MIL/uL (ref 3.87–5.11)
RDW: 14.7 % (ref 11.5–15.5)
WBC: 13.1 10*3/uL — ABNORMAL HIGH (ref 4.0–10.5)

## 2014-06-30 LAB — LEGIONELLA ANTIGEN, URINE

## 2014-06-30 LAB — HIV ANTIBODY (ROUTINE TESTING W REFLEX): HIV Screen 4th Generation wRfx: NONREACTIVE

## 2014-06-30 LAB — MAGNESIUM: Magnesium: 2.2 mg/dL (ref 1.5–2.5)

## 2014-06-30 MED ORDER — PREDNISONE 20 MG PO TABS: 20.0000 mg | ORAL_TABLET | Freq: Every day | ORAL | Status: DC

## 2014-06-30 MED ORDER — DM-GUAIFENESIN ER 30-600 MG PO TB12: 1.0000 | ORAL_TABLET | Freq: Two times a day (BID) | ORAL | Status: DC

## 2014-06-30 MED ORDER — LEVOFLOXACIN 500 MG PO TABS: 500.0000 mg | ORAL_TABLET | Freq: Every day | ORAL | Status: DC

## 2014-06-30 MED ORDER — IPRATROPIUM-ALBUTEROL 0.5-2.5 (3) MG/3ML IN SOLN: 3.0000 mL | Freq: Four times a day (QID) | RESPIRATORY_TRACT | Status: DC | PRN

## 2014-06-30 NOTE — Discharge Summary (Signed)
Physician Discharge Summary  [name] RUE:[id number] DOB: August 16, 1955 DOA: 06/28/2014  PCP: Verlon Au, MD  Admit date: 06/28/2014 Discharge date: 06/30/2014  Time spent: 65 minutes  Recommendations for Outpatient Follow-up:  1. Follow-up with Verlon Au, MD in 1 week. On follow-up patient COPD versus asthma will need to be reassessed at that time. Patient needs a basic metabolic profile done to follow-up on electrolytes and renal function.  Discharge Diagnoses:  Principal Problem:   COPD exacerbation Active Problems:   Prolonged QT interval   Chronic obstructive pulmonary disease with acute exacerbation   Discharge Condition: Stable and improved  Diet recommendation: Regular  Filed Weights   06/28/14 2045 06/29/14 0100  Weight: 84.823 kg (187 lb) 92.6 kg (204 lb 2.3 oz)    History of present illness:  Sonia Drake is a 59 y.o. female with past medical history of COPD, who presented with productive cough and shortness of breath.  Patient reported that she started having shortness of breath 1 day prior to admission. She was not sure if it was related to possible shellfish exposure. She was out to dinner at a [employer]. One of the people that she was eating with had seafood. Patient stated the meal she ate was cooked on the same hibachi table. She started to feel short of breath the evening prior to admission. She had a productive cough with greenish colored sputum production and mild chest discomfort. she had subjective fever and chills. She was evaluated in [facility] emergency room one day prior to admission. She had negative chest x-ray there, and was discharged home without new treatment. Patient denied runny nose or sore throat, abdominal pain, diarrhea, constipation, dysuria, urgency, frequency, hematuria, skin rashes, joint pain or leg swelling. No unilateral weakness, numbness or tingling sensations. No vision change or hearing  loss.  In ED, patient was found to have negative chest x-ray for pneumonia. No leukocytosis. BNP 28.7. Troponin negative. Temperature 98.2. Tachycardia. EKG showed prolongation of QTc interval 607 with T-wave inversion in inferior lead III and aVF. Patient was admitted to inpatient for further evaluation treatment.  Hospital Course:  #1 acute COPD exacerbation versus asthma exacerbation Patient was brought in with shortness of breath, wheezing, productive cough. Chest x-ray which was done on admission was negative for any acute infiltrate. Patient was admitted list on scheduled nebulizer treatments, IV steroids, IV Levaquin, Mucinex. Patient was pancultured with results pending. Flu PCR was also done which was negative. Patient improved clinically and be discharged home on a steroid taper, scheduled nebs, 4 days of oral antibiotics. Patient is to follow-up with PCP as outpatient.  #2 QTc prolongation/T-wave inversion on EKG Patient was noted to have QTc prolongation on admission as well as T-wave inversion on EKG. Cardiac enzymes were cycled which were negative 3. 2-D echo was obtained with the EF of 60-65% with no wall motion abnormalities. Patient remained chest pain-free throughout the hospitalization. Patient was discharged home in stable and improved condition and will follow-up with PCP as outpatient.  The rest of patient's chronic medical issues remained stable throughout the hospitalization and patient will be discharged in stable and improved condition.   Procedures:  2-D echo 06/29/2014  Chest x-ray 06/28/2014  Consultations:  None  Discharge Exam: Filed Vitals:   06/30/14 1318  BP: 120/50  Pulse: 101  Temp: 97.8 F (36.6 C)  Resp: 18    General: NAD Cardiovascular: RRR Respiratory: Minimal expiratory wheezing.  Discharge Instructions  Discharge Instructions    Diet general    Complete by:  As directed      Discharge instructions    Complete by:  As directed    Follow up with Verlon AuBoyd, Tammy Lamonica, MD in 1 week.     Increase activity slowly    Complete by:  As directed           Current Discharge Medication List    START taking these medications   Details  dextromethorphan-guaiFENesin (MUCINEX DM) 30-600 MG per 12 hr tablet Take 1 tablet by mouth 2 (two) times daily. Use for 4 days then as needed. Qty: 10 tablet, Refills: 0    ipratropium-albuterol (DUONEB) 0.5-2.5 (3) MG/3ML SOLN Take 3 mLs by nebulization every 6 (six) hours as needed. Use 3 times daily x 4 days then as needed. Qty: 360 mL, Refills: 0    levofloxacin (LEVAQUIN) 500 MG tablet Take 1 tablet (500 mg total) by mouth at bedtime. Take for 4 days. Qty: 4 tablet, Refills: 0    predniSONE (DELTASONE) 20 MG tablet Take 1-3 tablets (20-60 mg total) by mouth daily with breakfast. Take 3 tablets (60mg ) daily x 3 days, then 2 tablets (40mg ) daily x 3 days, then 1 tablet (20mg ) daily x 3 days then stop. Qty: 20 tablet, Refills: 0      CONTINUE these medications which have NOT CHANGED   Details  albuterol (PROVENTIL HFA;VENTOLIN HFA) 108 (90 BASE) MCG/ACT inhaler Inhale into the lungs every 6 (six) hours as needed for wheezing or shortness of breath.    albuterol (PROVENTIL) (2.5 MG/3ML) 0.083% nebulizer solution Take 2.5 mg by nebulization every 4 (four) hours as needed for wheezing or shortness of breath.    AZELASTINE HCL NA Place 1 spray into the nose 2 (two) times daily.    budesonide-formoterol (SYMBICORT) 160-4.5 MCG/ACT inhaler Inhale 2 puffs into the lungs 2 (two) times daily.    calcium-vitamin D (OSCAL WITH D) 500-200 MG-UNIT per tablet Take 1 tablet by mouth daily.    cetirizine (ZYRTEC) 10 MG tablet Take 10 mg by mouth every evening.     montelukast (SINGULAIR) 10 MG tablet Take 10 mg by mouth at bedtime.    tiotropium (SPIRIVA) 18 MCG inhalation capsule Place 18 mcg into inhaler and inhale daily.       Allergies  Allergen Reactions  . Contrast Media [Iodinated  Diagnostic Agents]     Cardiac arrest  . Peanuts [Peanut Oil] Shortness Of Breath and Swelling  . Shellfish Allergy Shortness Of Breath and Swelling  . Soap     Laundry detergent-swelling/shortness of breath.   Follow-up Information    Follow up with Verlon AuBoyd, Tammy Lamonica, MD. Schedule an appointment as soon as possible for a visit in 1 week.   Specialty:  Family Medicine   Contact information:   [address] [facility] [place] [id number] [phone]        The results of significant diagnostics from this hospitalization (including imaging, microbiology, ancillary and laboratory) are listed below for reference.    Significant Diagnostic Studies: Dg Chest Port 1 View  06/28/2014   CLINICAL DATA:  Cough, congestion, shortness of breath and chest pain for 2 days.  EXAM: PORTABLE CHEST - 1 VIEW  COMPARISON:  Frontal and lateral views earlier this day at 1930 hour [facility].  FINDINGS: Mild elevation of left hemidiaphragm. The bibasilar densities on prior exam are best appreciated on the lateral view, not well seen currently.  Minimal bibasilar atelectasis suspected. The heart size is normal. Pulmonary vasculature is normal. No pleural effusion or pneumothorax. No acute osseous abnormalities.  IMPRESSION: Bibasilar atelectasis. This was better appreciated on exam 2 hours prior on the lateral view.   Electronically Signed   By: Rubye Oaks M.D.   On: 06/28/2014 21:54    Microbiology: Recent Results (from the past 240 hour(s))  Culture, blood (routine x 2) Call MD if unable to obtain prior to antibiotics being given     Status: None (Preliminary result)   Collection Time: 06/29/14  2:10 AM  Result Value Ref Range Status   Specimen Description BLOOD LEFT ANTECUBITAL  Final   Special Requests BOTTLES DRAWN AEROBIC ONLY 6CC  Final   Culture   Final           BLOOD CULTURE RECEIVED NO GROWTH TO DATE CULTURE WILL BE HELD FOR 5 DAYS BEFORE ISSUING A  FINAL NEGATIVE REPORT Performed at [facility]    Report Status PENDING  Incomplete  Culture, blood (routine x 2) Call MD if unable to obtain prior to antibiotics being given     Status: None (Preliminary result)   Collection Time: 06/29/14  2:20 AM  Result Value Ref Range Status   Specimen Description BLOOD LEFT FOREARM  Final   Special Requests BOTTLES DRAWN AEROBIC ONLY 6CC  Final   Culture   Final           BLOOD CULTURE RECEIVED NO GROWTH TO DATE CULTURE WILL BE HELD FOR 5 DAYS BEFORE ISSUING A FINAL NEGATIVE REPORT Performed at [facility]    Report Status PENDING  Incomplete     Labs: Basic Metabolic Panel:  Recent Labs Lab 06/28/14 2148 06/29/14 0850 06/30/14 0445  NA 140 135 140  K 4.1 3.8 4.8  CL 103 100 104  CO2 GLUCOSE 95 218* 171*  BUN CREATININE 0.93 0.85 0.74  CALCIUM 10.1 9.4 10.5  MG  --  1.7 2.2   Liver Function Tests: No results for input(s): AST, ALT, ALKPHOS, BILITOT, PROT, ALBUMIN in the last 168 hours. No results for input(s): LIPASE, AMYLASE in the last 168 hours. No results for input(s): AMMONIA in the last 168 hours. CBC:  Recent Labs Lab 06/28/14 2148 06/29/14 0850 06/30/14 0445  WBC 8.1 6.3 13.1*  NEUTROABS  --  5.7  --   HGB 13.2 11.8* 13.0  HCT 42.8 38.1 41.0  MCV 92.6 91.6 91.1  PLT 225 225 240   Cardiac Enzymes:  Recent Labs Lab 06/29/14 0210 06/29/14 0705  TROPONINI <0.03 <0.03   BNP: BNP (last 3 results)  Recent Labs  06/28/14 2149  BNP 28.7    ProBNP (last 3 results) No results for input(s): PROBNP in the last 8760 hours.  CBG: No results for input(s): GLUCAP in the last 168 hours.     SignedRamiro Harvest MD [facility] 06/30/2014, 4:44 PM

## 2014-07-01 LAB — RESPIRATORY VIRUS PANEL
Adenovirus: NEGATIVE
Influenza A: POSITIVE — AB
Influenza B: POSITIVE — AB
Metapneumovirus: NEGATIVE
Parainfluenza 1: NEGATIVE
Parainfluenza 2: NEGATIVE
Parainfluenza 3: NEGATIVE
Respiratory Syncytial Virus A: NEGATIVE
Respiratory Syncytial Virus B: NEGATIVE
Rhinovirus: NEGATIVE

## 2014-07-02 ENCOUNTER — Inpatient Hospital Stay (HOSPITAL_COMMUNITY)
Admission: EM | Admit: 2014-07-02 | Discharge: 2014-07-07 | DRG: 190 | Disposition: A | Discharge status: Home Health | Payer: Medicaid Other | Attending: Internal Medicine | Admitting: Internal Medicine

## 2014-07-02 ENCOUNTER — Inpatient Hospital Stay (HOSPITAL_COMMUNITY): Payer: Medicaid Other

## 2014-07-02 ENCOUNTER — Emergency Department (HOSPITAL_COMMUNITY): Payer: Medicaid Other

## 2014-07-02 ENCOUNTER — Encounter (HOSPITAL_COMMUNITY): Payer: Self-pay | Admitting: Emergency Medicine

## 2014-07-02 DIAGNOSIS — Z79899 Other long term (current) drug therapy: Secondary | ICD-10-CM | POA: Diagnosis not present

## 2014-07-02 DIAGNOSIS — D72829 Elevated white blood cell count, unspecified: Secondary | ICD-10-CM | POA: Diagnosis not present

## 2014-07-02 DIAGNOSIS — E0781 Sick-euthyroid syndrome: Secondary | ICD-10-CM | POA: Diagnosis present

## 2014-07-02 DIAGNOSIS — Z825 Family history of asthma and other chronic lower respiratory diseases: Secondary | ICD-10-CM

## 2014-07-02 DIAGNOSIS — J45901 Unspecified asthma with (acute) exacerbation: Secondary | ICD-10-CM | POA: Diagnosis present

## 2014-07-02 DIAGNOSIS — Z8249 Family history of ischemic heart disease and other diseases of the circulatory system: Secondary | ICD-10-CM | POA: Diagnosis not present

## 2014-07-02 DIAGNOSIS — R0902 Hypoxemia: Secondary | ICD-10-CM | POA: Diagnosis present

## 2014-07-02 DIAGNOSIS — Z9109 Other allergy status, other than to drugs and biological substances: Secondary | ICD-10-CM

## 2014-07-02 DIAGNOSIS — J1000 Influenza due to other identified influenza virus with unspecified type of pneumonia: Secondary | ICD-10-CM | POA: Diagnosis present

## 2014-07-02 DIAGNOSIS — J1100 Influenza due to unidentified influenza virus with unspecified type of pneumonia: Secondary | ICD-10-CM | POA: Diagnosis present

## 2014-07-02 DIAGNOSIS — Z91041 Radiographic dye allergy status: Secondary | ICD-10-CM | POA: Diagnosis not present

## 2014-07-02 DIAGNOSIS — Z91010 Allergy to peanuts: Secondary | ICD-10-CM

## 2014-07-02 DIAGNOSIS — J440 Chronic obstructive pulmonary disease with acute lower respiratory infection: Secondary | ICD-10-CM | POA: Diagnosis present

## 2014-07-02 DIAGNOSIS — J45909 Unspecified asthma, uncomplicated: Secondary | ICD-10-CM | POA: Diagnosis present

## 2014-07-02 DIAGNOSIS — J441 Chronic obstructive pulmonary disease with (acute) exacerbation: Secondary | ICD-10-CM | POA: Diagnosis not present

## 2014-07-02 DIAGNOSIS — Z7952 Long term (current) use of systemic steroids: Secondary | ICD-10-CM | POA: Diagnosis not present

## 2014-07-02 DIAGNOSIS — R918 Other nonspecific abnormal finding of lung field: Secondary | ICD-10-CM | POA: Diagnosis present

## 2014-07-02 DIAGNOSIS — R739 Hyperglycemia, unspecified: Secondary | ICD-10-CM | POA: Diagnosis present

## 2014-07-02 DIAGNOSIS — Z833 Family history of diabetes mellitus: Secondary | ICD-10-CM

## 2014-07-02 DIAGNOSIS — Z91013 Allergy to seafood: Secondary | ICD-10-CM | POA: Diagnosis not present

## 2014-07-02 DIAGNOSIS — T380X5A Adverse effect of glucocorticoids and synthetic analogues, initial encounter: Secondary | ICD-10-CM | POA: Diagnosis not present

## 2014-07-02 DIAGNOSIS — R05 Cough: Secondary | ICD-10-CM | POA: Diagnosis present

## 2014-07-02 DIAGNOSIS — I5031 Acute diastolic (congestive) heart failure: Secondary | ICD-10-CM | POA: Diagnosis present

## 2014-07-02 HISTORY — DX: Unspecified asthma, uncomplicated: J45.909

## 2014-07-02 LAB — CBC
HCT: 44.6 % (ref 36.0–46.0)
HCT: 44.3 % (ref 36.0–46.0)
Hemoglobin: 13.6 g/dL (ref 12.0–15.0)
Hemoglobin: 13.6 g/dL (ref 12.0–15.0)
MCH: 28.6 pg (ref 26.0–34.0)
MCH: 28.8 pg (ref 26.0–34.0)
MCHC: 30.5 g/dL (ref 30.0–36.0)
MCHC: 30.7 g/dL (ref 30.0–36.0)
MCV: 93.7 fL (ref 78.0–100.0)
MCV: 93.9 fL (ref 78.0–100.0)
Platelets: 195 10*3/uL (ref 150–400)
Platelets: 248 10*3/uL (ref 150–400)
RBC: 4.76 MIL/uL (ref 3.87–5.11)
RBC: 4.72 MIL/uL (ref 3.87–5.11)
RDW: 14.9 % (ref 11.5–15.5)
RDW: 15.1 % (ref 11.5–15.5)
WBC: 10.3 10*3/uL (ref 4.0–10.5)
WBC: 8.8 10*3/uL (ref 4.0–10.5)

## 2014-07-02 LAB — BASIC METABOLIC PANEL
Anion gap: 10 (ref 5–15)
BUN: 11 mg/dL (ref 6–23)
CO2: 28 mmol/L (ref 19–32)
Calcium: 10 mg/dL (ref 8.4–10.5)
Chloride: 105 mmol/L (ref 96–112)
Creatinine, Ser: 0.78 mg/dL (ref 0.50–1.10)
GFR calc Af Amer: >90 mL/min (ref >90)
GFR calc non Af Amer: 89 mL/min — ABNORMAL LOW (ref >90)
Glucose, Bld: 199 mg/dL — ABNORMAL HIGH (ref 70–99)
Potassium: 5 mmol/L (ref 3.5–5.1)
Sodium: 143 mmol/L (ref 135–145)

## 2014-07-02 LAB — I-STAT TROPONIN, ED: Troponin i, poc: 0 ng/mL (ref 0.00–0.08)

## 2014-07-02 LAB — CREATININE, SERUM
Creatinine, Ser: 0.83 mg/dL (ref 0.50–1.10)
GFR calc Af Amer: 87 mL/min — ABNORMAL LOW (ref >90)
GFR calc non Af Amer: 75 mL/min — ABNORMAL LOW (ref >90)

## 2014-07-02 LAB — TSH: TSH: 0.282 u[IU]/mL — ABNORMAL LOW (ref 0.350–4.500)

## 2014-07-02 LAB — GLUCOSE, CAPILLARY
Glucose-Capillary: 196 mg/dL — ABNORMAL HIGH (ref 70–99)
Glucose-Capillary: 219 mg/dL — ABNORMAL HIGH (ref 70–99)

## 2014-07-02 LAB — BRAIN NATRIURETIC PEPTIDE: B Natriuretic Peptide: 34.3 pg/mL (ref 0.0–100.0)

## 2014-07-02 MED ORDER — INSULIN ASPART 100 UNIT/ML ~~LOC~~ SOLN
0.0000 [IU] | Freq: Three times a day (TID) | SUBCUTANEOUS | Status: DC
Start: 2014-07-02 — End: 2014-07-07
  Administered 2014-07-02: 2 [IU] via SUBCUTANEOUS
  Administered 2014-07-03: 3 [IU] via SUBCUTANEOUS
  Administered 2014-07-03 – 2014-07-04 (×2): 2 [IU] via SUBCUTANEOUS
  Administered 2014-07-04: 5 [IU] via SUBCUTANEOUS
  Administered 2014-07-04: 2 [IU] via SUBCUTANEOUS
  Administered 2014-07-05: 1 [IU] via SUBCUTANEOUS
  Administered 2014-07-06: 2 [IU] via SUBCUTANEOUS
  Administered 2014-07-06: 1 [IU] via SUBCUTANEOUS

## 2014-07-02 MED ORDER — MONTELUKAST SODIUM 10 MG PO TABS
10.0000 mg | ORAL_TABLET | Freq: Every day | ORAL | Status: DC
  Administered 2014-07-03 – 2014-07-06 (×4): 10 mg via ORAL
  Filled 2014-07-02 (×6): qty 1

## 2014-07-02 MED ORDER — SENNOSIDES-DOCUSATE SODIUM 8.6-50 MG PO TABS: 1.0000 | ORAL_TABLET | Freq: Every evening | ORAL | Status: DC | PRN

## 2014-07-02 MED ORDER — HYDROCODONE-ACETAMINOPHEN 5-325 MG PO TABS: 1.0000 | ORAL_TABLET | ORAL | Status: DC | PRN

## 2014-07-02 MED ORDER — INSULIN DETEMIR 100 UNIT/ML ~~LOC~~ SOLN
6.0000 [IU] | Freq: Every day | SUBCUTANEOUS | Status: DC
  Administered 2014-07-02 – 2014-07-06 (×5): 6 [IU] via SUBCUTANEOUS
  Filled 2014-07-02 (×5): qty 0.06

## 2014-07-02 MED ORDER — ALBUTEROL SULFATE (2.5 MG/3ML) 0.083% IN NEBU
5.0000 mg | INHALATION_SOLUTION | Freq: Once | RESPIRATORY_TRACT | Status: AC
  Administered 2014-07-02: 5 mg via RESPIRATORY_TRACT
  Filled 2014-07-02: qty 6

## 2014-07-02 MED ORDER — ACETAMINOPHEN 650 MG RE SUPP
650.0000 mg | Freq: Four times a day (QID) | RECTAL | Status: DC | PRN
Start: 2014-07-02 — End: 2014-07-07

## 2014-07-02 MED ORDER — VANCOMYCIN HCL IN DEXTROSE 1-5 GM/200ML-% IV SOLN
1000.0000 mg | Freq: Three times a day (TID) | INTRAVENOUS | Status: DC
  Administered 2014-07-02 – 2014-07-03 (×3): 1000 mg via INTRAVENOUS
  Filled 2014-07-02 (×4): qty 200

## 2014-07-02 MED ORDER — ONDANSETRON HCL 4 MG/2ML IJ SOLN: 4.0000 mg | Freq: Four times a day (QID) | INTRAMUSCULAR | Status: DC | PRN

## 2014-07-02 MED ORDER — DEXTROSE 5 % IV SOLN
1.0000 g | Freq: Three times a day (TID) | INTRAVENOUS | Status: DC
  Administered 2014-07-02 – 2014-07-03 (×3): 1 g via INTRAVENOUS
  Filled 2014-07-02 (×4): qty 1

## 2014-07-02 MED ORDER — MORPHINE SULFATE 2 MG/ML IJ SOLN: 1.0000 mg | INTRAMUSCULAR | Status: DC | PRN

## 2014-07-02 MED ORDER — ONDANSETRON HCL 4 MG PO TABS: 4.0000 mg | ORAL_TABLET | Freq: Four times a day (QID) | ORAL | Status: DC | PRN

## 2014-07-02 MED ORDER — PANTOPRAZOLE SODIUM 40 MG PO TBEC
40.0000 mg | DELAYED_RELEASE_TABLET | Freq: Every day | ORAL | Status: DC
  Administered 2014-07-02 – 2014-07-07 (×6): 40 mg via ORAL
  Filled 2014-07-02 (×6): qty 1

## 2014-07-02 MED ORDER — SODIUM CHLORIDE 0.9 % IV SOLN
INTRAVENOUS | Status: DC
  Administered 2014-07-03 – 2014-07-04 (×2): via INTRAVENOUS

## 2014-07-02 MED ORDER — IPRATROPIUM BROMIDE 0.02 % IN SOLN
0.5000 mg | Freq: Once | RESPIRATORY_TRACT | Status: AC
  Administered 2014-07-02: 0.5 mg via RESPIRATORY_TRACT
  Filled 2014-07-02: qty 2.5

## 2014-07-02 MED ORDER — ENOXAPARIN SODIUM 40 MG/0.4ML ~~LOC~~ SOLN
40.0000 mg | SUBCUTANEOUS | Status: DC
  Administered 2014-07-02 – 2014-07-06 (×5): 40 mg via SUBCUTANEOUS
  Filled 2014-07-02 (×6): qty 0.4

## 2014-07-02 MED ORDER — IPRATROPIUM-ALBUTEROL 0.5-2.5 (3) MG/3ML IN SOLN
3.0000 mL | Freq: Four times a day (QID) | RESPIRATORY_TRACT | Status: DC
  Administered 2014-07-02 – 2014-07-03 (×2): 3 mL via RESPIRATORY_TRACT
  Filled 2014-07-02 (×2): qty 3

## 2014-07-02 MED ORDER — MAGNESIUM SULFATE IN D5W 10-5 MG/ML-% IV SOLN
1.0000 g | Freq: Once | INTRAVENOUS | Status: AC
  Administered 2014-07-02: 1 g via INTRAVENOUS
  Filled 2014-07-02: qty 100

## 2014-07-02 MED ORDER — IPRATROPIUM-ALBUTEROL 0.5-2.5 (3) MG/3ML IN SOLN
3.0000 mL | Freq: Once | RESPIRATORY_TRACT | Status: AC
  Administered 2014-07-02: 3 mL via RESPIRATORY_TRACT
  Filled 2014-07-02: qty 3

## 2014-07-02 MED ORDER — CALCIUM CARBONATE-VITAMIN D 500-200 MG-UNIT PO TABS
1.0000 | ORAL_TABLET | Freq: Every day | ORAL | Status: DC
  Administered 2014-07-03 – 2014-07-07 (×5): 1 via ORAL
  Filled 2014-07-02 (×5): qty 1

## 2014-07-02 MED ORDER — ALUM & MAG HYDROXIDE-SIMETH 200-200-20 MG/5ML PO SUSP: 30.0000 mL | Freq: Four times a day (QID) | ORAL | Status: DC | PRN

## 2014-07-02 MED ORDER — TIOTROPIUM BROMIDE MONOHYDRATE 18 MCG IN CAPS: 18.0000 ug | ORAL_CAPSULE | Freq: Every day | RESPIRATORY_TRACT | Status: DC

## 2014-07-02 MED ORDER — ALBUTEROL (5 MG/ML) CONTINUOUS INHALATION SOLN
10.0000 mg/h | INHALATION_SOLUTION | Freq: Once | RESPIRATORY_TRACT | Status: AC
  Administered 2014-07-02: 10 mg/h via RESPIRATORY_TRACT

## 2014-07-02 MED ORDER — ALBUTEROL SULFATE (2.5 MG/3ML) 0.083% IN NEBU
2.5000 mg | INHALATION_SOLUTION | RESPIRATORY_TRACT | Status: DC | PRN
  Administered 2014-07-02: 2.5 mg via RESPIRATORY_TRACT
  Filled 2014-07-02: qty 3

## 2014-07-02 MED ORDER — ACETAMINOPHEN 325 MG PO TABS: 650.0000 mg | ORAL_TABLET | Freq: Four times a day (QID) | ORAL | Status: DC | PRN

## 2014-07-02 MED ORDER — ZOLPIDEM TARTRATE 5 MG PO TABS: 5.0000 mg | ORAL_TABLET | Freq: Every evening | ORAL | Status: DC | PRN

## 2014-07-02 MED ORDER — METHYLPREDNISOLONE SODIUM SUCC 125 MG IJ SOLR
125.0000 mg | Freq: Four times a day (QID) | INTRAMUSCULAR | Status: DC
  Administered 2014-07-02 – 2014-07-03 (×4): 125 mg via INTRAVENOUS
  Filled 2014-07-02 (×7): qty 2

## 2014-07-02 MED ORDER — FUROSEMIDE 10 MG/ML IJ SOLN
20.0000 mg | Freq: Once | INTRAMUSCULAR | Status: AC
  Administered 2014-07-02: 20 mg via INTRAVENOUS
  Filled 2014-07-02: qty 2

## 2014-07-02 MED ORDER — BUDESONIDE-FORMOTEROL FUMARATE 160-4.5 MCG/ACT IN AERO
2.0000 | INHALATION_SPRAY | Freq: Two times a day (BID) | RESPIRATORY_TRACT | Status: DC
  Administered 2014-07-02 – 2014-07-07 (×10): 2 via RESPIRATORY_TRACT
  Filled 2014-07-02: qty 6

## 2014-07-02 MED ORDER — ALBUTEROL (5 MG/ML) CONTINUOUS INHALATION SOLN: 10.0000 mg/h | INHALATION_SOLUTION | Freq: Once | RESPIRATORY_TRACT | Status: DC

## 2014-07-02 MED ORDER — METHYLPREDNISOLONE SODIUM SUCC 125 MG IJ SOLR
125.0000 mg | Freq: Once | INTRAMUSCULAR | Status: AC
  Administered 2014-07-02: 125 mg via INTRAVENOUS
  Filled 2014-07-02: qty 2

## 2014-07-02 MED ORDER — VITAMIN D3 25 MCG (1000 UNIT) PO TABS
1000.0000 [IU] | ORAL_TABLET | Freq: Every day | ORAL | Status: DC
  Administered 2014-07-03 – 2014-07-07 (×5): 1000 [IU] via ORAL
  Filled 2014-07-02 (×5): qty 1

## 2014-07-02 NOTE — ED Notes (Signed)
Pt c/o asthma exacerbation and SOB. Hx COPD. Recently hospitalized for asthma exacerbation. Says her house was "very hot" last night and thinks this triggered her asthma attack. Pt visibly SOB and dyspneic. Audibly wheezing. MD Gwendolyn GrantWalden at bedside. Breathing tx started immediately. Ambulatory with assistance. A&Ox4. Unable to speak full sentences without becoming increasingly SOB. Used Albuterol inhaler at 0600 and 0900. Took 60 mg Prednisone this morning.

## 2014-07-02 NOTE — Progress Notes (Signed)
ANTIBIOTIC CONSULT NOTE - INITIAL  Pharmacy Consult for Vancomycin/Cefepime Indication: pneumonia  Allergies  Allergen Reactions  . Contrast Media [Iodinated Diagnostic Agents]     Cardiac arrest  . Peanuts [Peanut Oil] Shortness Of Breath and Swelling  . Shellfish Allergy Shortness Of Breath and Swelling  . Soap     Laundry detergent-swelling/shortness of breath.    Patient Measurements: Height:  (162.6 cm) Weight: 206 lb 12.7 oz (93.8 kg) IBW/kg (Calculated) : 54.7 Adjusted Body Weight: n/a  Vital Signs: Temp: 98 F (36.7 C) (03/12 1625) Temp Source: Oral (03/12 1625) BP: 139/80 mmHg (03/12 1625) Pulse Rate: 107 (03/12 1625) Intake/Output from previous day:   Intake/Output from this shift:    Labs:  Recent Labs  06/30/14 0445 07/02/14 1212 07/02/14 1729  WBC 13.1* 10.3 8.8  HGB 13.0 13.6 13.6  PLT 240 195 248  CREATININE 0.74 0.78 0.83   Estimated Creatinine Clearance: 80 mL/min (by C-G formula based on Cr of 0.83). No results for input(s): VANCOTROUGH, VANCOPEAK, VANCORANDOM, GENTTROUGH, GENTPEAK, GENTRANDOM, TOBRATROUGH, TOBRAPEAK, TOBRARND, AMIKACINPEAK, AMIKACINTROU, AMIKACIN in the last 72 hours.   Microbiology: Recent Results (from the past 720 hour(s))  Respiratory virus panel     Status: Abnormal   Collection Time: 06/29/14  2:09 AM  Result Value Ref Range Status   Source - RVPAN NOSE  Corrected   Respiratory Syncytial Virus A Negative Negative Final   Respiratory Syncytial Virus B Negative Negative Final   Influenza A Positive (A) Negative Final    Comment: Positive for Influenza A 2009 H1N1   Influenza B Positive (A) Negative Final   Parainfluenza 1 Negative Negative Final   Parainfluenza 2 Negative Negative Final   Parainfluenza 3 Negative Negative Final   Metapneumovirus Negative Negative Final   Rhinovirus Negative Negative Final   Adenovirus Negative Negative Final    Comment: (NOTE) Performed At: [facility] [address] [place], [place] [id number] [name] MD WU:[id number]   Culture, blood (routine x 2) Call MD if unable to obtain prior to antibiotics being given     Status: None (Preliminary result)   Collection Time: 06/29/14  2:10 AM  Result Value Ref Range Status   Specimen Description BLOOD LEFT ANTECUBITAL  Final   Special Requests BOTTLES DRAWN AEROBIC ONLY 6CC  Final   Culture   Final           BLOOD CULTURE RECEIVED NO GROWTH TO DATE CULTURE WILL BE HELD FOR 5 DAYS BEFORE ISSUING A FINAL NEGATIVE REPORT Performed at [facility]    Report Status PENDING  Incomplete  Culture, blood (routine x 2) Call MD if unable to obtain prior to antibiotics being given     Status: None (Preliminary result)   Collection Time: 06/29/14  2:20 AM  Result Value Ref Range Status   Specimen Description BLOOD LEFT FOREARM  Final   Special Requests BOTTLES DRAWN AEROBIC ONLY 6CC  Final   Culture   Final           BLOOD CULTURE RECEIVED NO GROWTH TO DATE CULTURE WILL BE HELD FOR 5 DAYS BEFORE ISSUING A FINAL NEGATIVE REPORT Performed at [facility]    Report Status PENDING  Incomplete    Medical History: Past Medical History  Diagnosis Date  . COPD (chronic obstructive pulmonary disease)   . Asthma     Medications:  Scheduled:  . albuterol  10 mg/hr Nebulization Once  . budesonide-formoterol  2 puff Inhalation BID  . [  START ON 07/03/2014] calcium-vitamin D  1 tablet Oral Daily  . ceFEPime (MAXIPIME) IV  1 g Intravenous Q8H  . [START ON 07/03/2014] cholecalciferol  1,000 Units Oral Daily  . enoxaparin (LOVENOX) injection  40 mg Subcutaneous Q24H  . insulin aspart  0-9 Units Subcutaneous TID WC  . insulin detemir  6 Units Subcutaneous QHS  . ipratropium-albuterol  3 mL Nebulization Q6H  . methylPREDNISolone (SOLU-MEDROL) injection  125 mg Intravenous Q6H  . [START ON 07/03/2014] montelukast  10 mg Oral QHS  . pantoprazole  40 mg Oral Daily  . vancomycin  1,000 mg Intravenous Q8H    Infusions:  . sodium chloride     PRN: acetaminophen **OR** acetaminophen, albuterol, alum & mag hydroxide-simeth, HYDROcodone-acetaminophen, morphine injection, ondansetron **OR** ondansetron (ZOFRAN) IV, senna-docusate, zolpidem  Assessment 58yoF with COPD/BA, presenting with recurrent cough productive of brown colored sputum, wheezing and SOB suggesting recurrent AECOPD/asthma.  Treated and released 2 days ago for same; discharged on Levaquin.  Still audibly wheezing; CXR shows mild hyperexpansion w/o acute disease.    3/12 >> vancomycin >> 3/12 >> cefepime >>  Temp: Afebrile WBC: wnl; improved from last visit Renal: SCr 0.83; CrCl 80 CG SpO2: 2L [place]  3/12 sputum: IP 3/12 urine: needs collecting 3/12 legionella UAg:  (previous legionella/strep UAg negative from 3/9)   Goal of Therapy:   Vancomycin trough level 15-20 mcg/ml   Eradication of infection  Appropriate antibiotic dosing for indication and renal function  Plan:   Begin vancomycin 1g IV q8h  Begin cefepime 1g IV q8h  Would check vancomycin trough no later than before 6th (10am) dose on 3/14  Follow clinical course, culture results as available, renal function  Follow for de-escalation of antibiotics and LOT   Bernadene Personrew Grissel Tyrell, PharmD Pager: [phone] 07/02/2014, 8:58 PM

## 2014-07-02 NOTE — ED Notes (Signed)
Respiratory informed of pending continuous neb treatment.

## 2014-07-02 NOTE — ED Provider Notes (Signed)
CSN: [id number]     Arrival date & time 07/02/14  1148 History   First MD Initiated Contact with Patient 07/02/14 1155     Chief Complaint  Patient presents with  . Asthma  . Shortness of Breath     (Consider location/radiation/quality/duration/timing/severity/associated sxs/prior Treatment) Patient is a 59 y.o. female presenting with shortness of breath. The history is provided by the patient.  Shortness of Breath Severity:  Moderate Onset quality:  Gradual Timing:  Constant Progression:  Unchanged Chronicity:  Recurrent Context: weather changes   Relieved by:  Nothing Ineffective treatments:  Inhaler Associated symptoms: chest pain (with cough) and cough (with brown/yellow sputum)   Associated symptoms: no abdominal pain, no fever and no vomiting     Past Medical History  Diagnosis Date  . COPD (chronic obstructive pulmonary disease)   . Asthma    History reviewed. No pertinent past surgical history. Family History  Problem Relation Age of Onset  . Heart disease Mother   . Heart disease Father   . Diabetes Father   . Asthma Father   . Diabetes Brother   . Diabetes Sister   . Asthma Brother   . Asthma Sister   . Hypertension Sister    History  Substance Use Topics  . Smoking status: Never Smoker   . Smokeless tobacco: Not on file  . Alcohol Use: Not on file   OB History    No data available     Review of Systems  Constitutional: Negative for fever and chills.  Respiratory: Positive for cough (with brown/yellow sputum) and shortness of breath.   Cardiovascular: Positive for chest pain (with cough).  Gastrointestinal: Negative for vomiting and abdominal pain.  All other systems reviewed and are negative.     Allergies  Contrast media; Peanuts; Shellfish allergy; and Soap  Home Medications   Prior to Admission medications   Medication Sig Start Date End Date Taking? Authorizing Provider  albuterol (PROVENTIL HFA;VENTOLIN HFA) 108 (90 BASE) MCG/ACT  inhaler Inhale into the lungs every 6 (six) hours as needed for wheezing or shortness of breath.    Historical Provider, MD  albuterol (PROVENTIL) (2.5 MG/3ML) 0.083% nebulizer solution Take 2.5 mg by nebulization every 4 (four) hours as needed for wheezing or shortness of breath.    Historical Provider, MD  AZELASTINE HCL NA Place 1 spray into the nose 2 (two) times daily.    Historical Provider, MD  budesonide-formoterol (SYMBICORT) 160-4.5 MCG/ACT inhaler Inhale 2 puffs into the lungs 2 (two) times daily.    Historical Provider, MD  calcium-vitamin D (OSCAL WITH D) 500-200 MG-UNIT per tablet Take 1 tablet by mouth daily.    Historical Provider, MD  cetirizine (ZYRTEC) 10 MG tablet Take 10 mg by mouth every evening.     Historical Provider, MD  dextromethorphan-guaiFENesin (MUCINEX DM) 30-600 MG per 12 hr tablet Take 1 tablet by mouth 2 (two) times daily. Use for 4 days then as needed. 06/30/14   Rodolph Bong, MD  ipratropium-albuterol (DUONEB) 0.5-2.5 (3) MG/3ML SOLN Take 3 mLs by nebulization every 6 (six) hours as needed. Use 3 times daily x 4 days then as needed. 06/30/14   Rodolph Bong, MD  levofloxacin (LEVAQUIN) 500 MG tablet Take 1 tablet (500 mg total) by mouth at bedtime. Take for 4 days. 06/30/14   Rodolph Bong, MD  montelukast (SINGULAIR) 10 MG tablet Take 10 mg by mouth at bedtime.    Historical Provider, MD  predniSONE (DELTASONE) 20 MG tablet  Take 1-3 tablets (20-60 mg total) by mouth daily with breakfast. Take 3 tablets ( ) daily x 3 days, then 2 tablets ( ) daily x 3 days, then 1 tablet ( ) daily x 3 days then stop. 06/30/14   Rodolph Bong, MD  tiotropium (SPIRIVA) 18 MCG inhalation capsule Place 18 mcg into inhaler and inhale daily.    Historical Provider, MD   BP 143/72 mmHg  Pulse 122  Temp(Src) 98 F (36.7 C) (Oral)  Resp 17 Physical Exam  Constitutional: She is oriented to person, place, and time. She appears well-developed and well-nourished. No  distress.  HENT:  Head: Normocephalic and atraumatic.  Mouth/Throat: Oropharynx is clear and moist.  Eyes: EOM are normal. Pupils are equal, round, and reactive to light.  Neck: Normal range of motion. Neck supple.  Cardiovascular: Regular rhythm.  Tachycardia present.  Exam reveals no friction rub.   No murmur heard. Pulmonary/Chest: She is in respiratory distress. She has wheezes (diffusely). She has no rales.  Abdominal: Soft. She exhibits no distension. There is no tenderness. There is no rebound.  Musculoskeletal: Normal range of motion. She exhibits no edema.  Neurological: She is alert and oriented to person, place, and time.  Skin: She is not diaphoretic.  Nursing note and vitals reviewed.   ED Course  Procedures (including critical care time) Labs Review Labs Reviewed  CBC  BASIC METABOLIC PANEL  I-STAT TROPOININ, ED    Imaging Review Dg Chest 2 View (if Patient Has Fever And/or Copd)  07/02/2014   CLINICAL DATA:  Cough, congestion and shortness of breath. Intermittent wheeze for 2 weeks. History of COPD, asthma.  EXAM: CHEST  2 VIEW  COMPARISON:  06/28/2014 ; 08/30/2011  FINDINGS: Grossly unchanged cardiac silhouette and mediastinal contours. The lungs appear mildly hyperexpanded. No focal airspace opacities. No pleural effusion pneumothorax. No evidence of edema. No acute osseus abnormalities.  IMPRESSION: Mild lung hyperexpansion without acute cardiopulmonary disease.   Electronically Signed   By: Simonne Come M.D.   On: 07/02/2014 13:20   Ct Chest Wo Contrast  07/02/2014   CLINICAL DATA:  59 year old female with history of COPD with recurrent cough productive of brown colored sputum, wheezing and shortness of Breath.  EXAM: CT CHEST WITHOUT CONTRAST  TECHNIQUE: Multidetector CT imaging of the chest was performed following the standard protocol without IV contrast.  COMPARISON:  No priors.  FINDINGS: Mediastinum/Lymph Nodes: Heart size is normal. There is no significant  pericardial fluid, thickening or pericardial calcification. No pathologically enlarged mediastinal or hilar lymph nodes. Please note that accurate exclusion of hilar adenopathy is limited on noncontrast CT scans. Esophagus is unremarkable in appearance. No axillary lymphadenopathy.  Lungs/Pleura: Mild diffuse bronchial wall thickening. More severe focal bronchial wall thickening in the right lower lobe where there is some peribronchovascular ground-glass attenuation in the basal segments. No other consolidative airspace disease. Mild linear scarring in the inferior aspect of the left lower lobe. Two small 6 mm ground-glass attenuation nodules in the left lower lobe (image 29 of series 5), and in the periphery of the right lower lobe (image 36 of series 5). No other larger more suspicious appearing pulmonary nodules or masses are noted. No pleural effusions.  Upper Abdomen: Unremarkable.  Musculoskeletal/Soft Tissues: There are no aggressive appearing lytic or blastic lesions noted in the visualized portions of the skeleton.  IMPRESSION: 1. Mild diffuse bronchial wall thickening may be chronic, related to patient's underlying chronic bronchitis. However, there is a more severe focal bronchial wall thickening in  the right lower lobe where there is a small amount of peribronchovascular ground-glass attenuation, which could indicate developing bronchopneumonia. This is very mild. 2. Two small 6 mm ground-glass attenuation nodules in the left lower lobe and right lower lobe, as above. Initial follow-up by chest CT without contrast is recommended in 3 months to confirm persistence. This recommendation follows the consensus statement: Recommendations for the Management of Subsolid Pulmonary Nodules Detected at CT: A Statement from the Fleischner Society as published in Radiology 2013; 266:304-317.   Electronically Signed   By: Trudie Reedaniel  Entrikin M.D.   On: 07/02/2014 18:31     EKG Interpretation   Date/Time:  Saturday  July 02 2014 12:03:47 EST Ventricular Rate:  123 PR Interval:  110 QRS Duration: 100 QT Interval:  329 QTC Calculation: 471 R Axis:   84 Text Interpretation:  Sinus tachycardia Borderline right axis deviation  Repol abnrm suggests ischemia, inferior leads Baseline wander in lead(s)  III Similar to prior Confirmed by Gwendolyn GrantWALDEN  MD, Burlin Mcnair ([id number]) on 07/02/2014  12:34:16 PM      CRITICAL CARE Performed by: Dagmar HaitWALDEN, WILLIAM Marjie Chea   Total critical care time: 30 minutes  Critical care time was exclusive of separately billable procedures and treating other patients.  Critical care was necessary to treat or prevent imminent or life-threatening deterioration.  Critical care was time spent personally by me on the following activities: development of treatment plan with patient and/or surrogate as well as nursing, discussions with consultants, evaluation of patient's response to treatment, examination of patient, obtaining history from patient or surrogate, ordering and performing treatments and interventions, ordering and review of laboratory studies, ordering and review of radiographic studies, pulse oximetry and re-evaluation of patient's condition.   MDM   Final diagnoses:  COPD with acute exacerbation    67F here with SOB. Recently treated for asthma in the ED, was doing well when she left. Stated her house got hot and [facility] without any relief.  Afebrile, hypoxic at 87% on room air. Satting well on 2 L [place]. Diffuse wheezing in her lungs, will plan on IV steroids, continuous nebs and ipratropium. Associated cough with brown sputum, will get CXR. CXR negative.  After 1st neb, still having moderate wheezing. She states some mild improvement. Plan on magnesium, 2nd breathing treatment. Admitted.    Elwin MochaBlair Jamaris Theard, MD 07/03/14 [phone]

## 2014-07-02 NOTE — ED Notes (Signed)
Report given charge Rn 3W

## 2014-07-02 NOTE — H&P (Addendum)
Patient Demographics  Sonia Drake, is a 59 y.o. female  MRN: [id number]   DOB - 08/05/1955  Admit Date - 07/02/2014  Outpatient Primary MD for the patient is Verlon Au, MD   Assessment Sonia Drake is a pleasant 59 y.o. 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 She says she improved clinically prior to discharge but when she got home she believes that her house was too hot and this triggered another asthma attack. She was discharged on Levaquin/bronchodilators/systemic steroids and she states that she has been taking the discharge medications as prescribed. The concerning thing is that patient actually had fever during the first admission and there is a possibility that her infection is partially treated, therefore triggering another exacerbation. Her white count is normal and chest x-ray shows "Mild lung hyperexpansion without acute cardiopulmonary disease". She is still audibly wheezing. We'll therefore admit her for management including further workup with CT chest to assess lung parenchyma/sputum culture/UA/urine Legionella antigen. Will cover for possible HCAP with vancomycin/cefepime, and give bronchodilators/systemic steroids and oxygen supplementation as necessary. Patient is to follow with pulmonary for maintenance care upon discharge. Patient is hyperglycemic but she denies prior history of diabetes. The hyperglycemia may therefore be steroid-induced. Will check hemoglobin A1c and place on sliding scale insulin for now. Plan Asthma/COPD with acute exacerbation  Admit MedSurg  Septic workup  CT chest without contrast  Vancomycin/cefepime/Solu-Medrol/Duonebs/oxygen as needed Hyperglycemia  Hemoglobin A1c  SSI DVT/GI  Prophylaxis  Lovenox  PPI Chief Complaint Chief Complaint  Patient presents with  . Asthma  . Shortness of Breath     HPI Sonia Drake  is a 59 y.o. female  with COPD/BA since childhood, nonsmoker, who comes in with worsening shortness of breath and wheezing that started evening after discharge from the hospital. She says that she improved clinically in a house but as soon as she got to her house she experienced shortness of breath, cough productive of brownish colored sputum and wheezing. This did not improve with interventions at home, therefore coming to the ED. She blames her home environment for the recurrent exacerbations. She says she visited the emergency room more than 7 times in the past year. Her brother died at age 61 of bronchial asthma exacerbation. She denies any nausea, vomiting, diarrhea or dysuria.  Family Communication: Admission, patients condition and plan of care including tests being ordered have been discussed with the patient who indicates understanding and agree with the plan and Code Status.  Code Status   Full  Likely DC  Home  Condition GUARDED    Time spent in minutes : 45  Review of Systems   As in the HPI above,   Past Medical History  Diagnosis Date  . COPD (chronic obstructive pulmonary disease)   . Asthma       History reviewed. No pertinent past surgical history.  Social History History  Substance Use Topics  . Smoking status: Never Smoker   . Smokeless tobacco: Not on file  . Alcohol Use: Not on file    Family History Family History  Problem Relation Age of Onset  . Heart disease Mother   . Heart disease Father   . Diabetes Father   . Asthma Father   . Diabetes Brother   . Diabetes Sister   .  Asthma Brother   . Asthma Sister   . Hypertension Sister     Prior to Admission medications   Medication Sig Start Date End Date Taking? Authorizing Provider  albuterol (PROVENTIL HFA;VENTOLIN HFA) 108 (90 BASE) MCG/ACT inhaler  Inhale into the lungs every 6 (six) hours as needed for wheezing or shortness of breath.   Yes Historical Provider, MD  albuterol (PROVENTIL) (2.5 MG/3ML) 0.083% nebulizer solution Take 2.5 mg by nebulization every 4 (four) hours as needed for wheezing or shortness of breath.   Yes Historical Provider, MD  AZELASTINE HCL NA Place 1 spray into the nose 2 (two) times daily.   Yes Historical Provider, MD  budesonide-formoterol (SYMBICORT) 160-4.5 MCG/ACT inhaler Inhale 2 puffs into the lungs 2 (two) times daily.   Yes Historical Provider, MD  calcium-vitamin D (OSCAL WITH D) 500-200 MG-UNIT per tablet Take 1 tablet by mouth daily.   Yes Historical Provider, MD  cetirizine (ZYRTEC) 10 MG tablet Take 10 mg by mouth every evening.    Yes Historical Provider, MD  cholecalciferol (VITAMIN D) 1000 UNITS tablet Take 1,000 Units by mouth daily.   Yes Historical Provider, MD  ipratropium-albuterol (DUONEB) 0.5-2.5 (3) MG/3ML SOLN Take 3 mLs by nebulization every 6 (six) hours as needed. Use 3 times daily x 4 days then as needed. 06/30/14  Yes Rodolph Bong, MD  levofloxacin (LEVAQUIN) 500 MG tablet Take 1 tablet (500 mg total) by mouth at bedtime. Take for 4 days. 06/30/14  Yes Rodolph Bong, MD  montelukast (SINGULAIR) 10 MG tablet Take 10 mg by mouth at bedtime.   Yes Historical Provider, MD  pantoprazole (PROTONIX) 20 MG tablet Take 20 mg by mouth daily as needed for indigestion.   Yes Historical Provider, MD  predniSONE (DELTASONE) 20 MG tablet Take 1-3 tablets (20-60 mg total) by mouth daily with breakfast. Take 3 tablets ( ) daily x 3 days, then 2 tablets ( ) daily x 3 days, then 1 tablet ( ) daily x 3 days then stop. 06/30/14  Yes Rodolph Bong, MD  tiotropium (SPIRIVA) 18 MCG inhalation capsule Place 18 mcg into inhaler and inhale daily.   Yes Historical Provider, MD  dextromethorphan-guaiFENesin (MUCINEX DM) 30-600 MG per 12 hr tablet Take 1 tablet by mouth 2 (two) times daily. Use for 4  days then as needed. 06/30/14   Rodolph Bong, MD    Allergies  Allergen Reactions  . Contrast Media [Iodinated Diagnostic Agents]     Cardiac arrest  . Peanuts [Peanut Oil] Shortness Of Breath and Swelling  . Shellfish Allergy Shortness Of Breath and Swelling  . Soap     Laundry detergent-swelling/shortness of breath.    Physical Exam  Vitals  Blood pressure 129/59, pulse 113, temperature 98 F (36.7 C), temperature source Oral, resp. rate 21, SpO2 94 %.   1. General: lying in bed in NAD.  2. Normal affect and insight, Not Suicidal or Homicidal, Awake Alert, Oriented X 3.  3. No F.N deficits, ALL C.Nerves Intact, Strength 5/5 all 4 extremities, Sensation intact all 4 extremities, Plantars down going.  4. Ears and Eyes appear Normal, Conjunctivae clear, PERRLA. Moist Oral Mucosa.  5. Supple Neck, No JVD, No cervical lymphadenopathy appriciated, No Carotid Bruits.  6. Symmetrical Chest wall movement, Good air movement bilaterally, wheezing both lung fields. No rhonchi or rales.  7. RRR, No Gallops, Rubs or Murmurs, No Parasternal Heave.  8. Positive Bowel Sounds, Abdomen Soft, Non tender, No organomegaly appriciated,No rebound -guarding or rigidity.  9.  No Cyanosis, Normal Skin Turgor, No Skin Rash or Bruise.  10. Good muscle tone,  joints appear normal , no effusions, Normal ROM.  11. No Palpable Lymph Nodes in Neck or Axillae  Data Review  CBC  Recent Labs Lab 06/28/14 2148 06/29/14 0850 06/30/14 0445 07/02/14 1212  WBC 8.1 6.3 13.1* 10.3  HGB 13.2 11.8* 13.0 13.6  HCT 42.8 38.1 41.0 44.6  PLT 225 225 240 195  MCV 92.6 91.6 91.1 93.7  MCH 28.6 28.4 28.9 28.6  MCHC 30.8 31.0 31.7 30.5  RDW 14.1 14.4 14.7 14.9  LYMPHSABS  --  0.5*  --   --   MONOABS  --  0.1  --   --   EOSABS  --  0.0  --   --   BASOSABS  --  0.0  --   --     ------------------------------------------------------------------------------------------------------------------  Chemistries   Recent Labs Lab 06/28/14 2148 06/29/14 0850 06/30/14 0445 07/02/14 1212  NA 140 135 140 143  K 4.1 3.8 4.8 5.0  CL 103 100 104 105  CO2 30 25 29 28   GLUCOSE 95 218* 171* 199*  BUN 7 9 10 11   CREATININE 0.93 0.85 0.74 0.78  CALCIUM 10.1 9.4 10.5 10.0  MG  --  1.7 2.2  --    ------------------------------------------------------------------------------------------------------------------ estimated creatinine clearance is 82.5 mL/min (by C-G formula based on Cr of 0.78). ------------------------------------------------------------------------------------------------------------------ No results for input(s): TSH, T4TOTAL, T3FREE, THYROIDAB in the last 72 hours.  Invalid input(s): FREET3   Coagulation profile No results for input(s): INR, PROTIME in the last 168 hours. ------------------------------------------------------------------------------------------------------------------- No results for input(s): DDIMER in the last 72 hours. -------------------------------------------------------------------------------------------------------------------  Cardiac Enzymes  Recent Labs Lab 06/29/14 0210 06/29/14 0705  TROPONINI <0.03 <0.03   ------------------------------------------------------------------------------------------------------------------ Invalid input(s): POCBNP   ---------------------------------------------------------------------------------------------------------------  Urinalysis No results found for: COLORURINE, APPEARANCEUR, LABSPEC, PHURINE, GLUCOSEU, HGBUR, BILIRUBINUR, KETONESUR, PROTEINUR, UROBILINOGEN, NITRITE, LEUKOCYTESUR  ----------------------------------------------------------------------------------------------------------------  Imaging results:   Dg Chest 2 View (if Patient Has Fever And/or  Copd)  07/02/2014   CLINICAL DATA:  Cough, congestion and shortness of breath. Intermittent wheeze for 2 weeks. History of COPD, asthma.  EXAM: CHEST  2 VIEW  COMPARISON:  06/28/2014 ; 08/30/2011  FINDINGS: Grossly unchanged cardiac silhouette and mediastinal contours. The lungs appear mildly hyperexpanded. No focal airspace opacities. No pleural effusion pneumothorax. No evidence of edema. No acute osseus abnormalities.  IMPRESSION: Mild lung hyperexpansion without acute cardiopulmonary disease.   Electronically Signed   By: Simonne ComeJohn  Watts M.D.   On: 07/02/2014 13:20   Dg Chest Port 1 View  06/28/2014   CLINICAL DATA:  Cough, congestion, shortness of breath and chest pain for 2 days.  EXAM: PORTABLE CHEST - 1 VIEW  COMPARISON:  Frontal and lateral views earlier this day at 1930 hour at [facility].  FINDINGS: Mild elevation of left hemidiaphragm. The bibasilar densities on prior exam are best appreciated on the lateral view, not well seen currently. Minimal bibasilar atelectasis suspected. The heart size is normal. Pulmonary vasculature is normal. No pleural effusion or pneumothorax. No acute osseous abnormalities.  IMPRESSION: Bibasilar atelectasis. This was better appreciated on exam 2 hours prior on the lateral view.   Electronically Signed   By: Rubye OaksMelanie  Ehinger M.D.   On: 06/28/2014 21:54        Weslie Pretlow M.D on 07/02/2014 at 4:55 PM  Between 7am to 7pm - Pager - [phone]  After 7pm go to [email or web address] - password [email or web address]  And look for  the night coverage person covering me after hours  [facility] Office  [phone]

## 2014-07-03 DIAGNOSIS — J1100 Influenza due to unidentified influenza virus with unspecified type of pneumonia: Secondary | ICD-10-CM | POA: Diagnosis present

## 2014-07-03 DIAGNOSIS — R918 Other nonspecific abnormal finding of lung field: Secondary | ICD-10-CM | POA: Diagnosis present

## 2014-07-03 DIAGNOSIS — E0781 Sick-euthyroid syndrome: Secondary | ICD-10-CM | POA: Diagnosis present

## 2014-07-03 LAB — COMPREHENSIVE METABOLIC PANEL
ALT: 27 U/L (ref 0–35)
AST: 24 U/L (ref 0–37)
Albumin: 3.7 g/dL (ref 3.5–5.2)
Alkaline Phosphatase: 86 U/L (ref 39–117)
Anion gap: 8 (ref 5–15)
BUN: 10 mg/dL (ref 6–23)
CO2: 31 mmol/L (ref 19–32)
Calcium: 9.8 mg/dL (ref 8.4–10.5)
Chloride: 99 mmol/L (ref 96–112)
Creatinine, Ser: 0.77 mg/dL (ref 0.50–1.10)
GFR calc Af Amer: >90 mL/min (ref >90)
GFR calc non Af Amer: 89 mL/min — ABNORMAL LOW (ref >90)
Glucose, Bld: 262 mg/dL — ABNORMAL HIGH (ref 70–99)
Potassium: 4.1 mmol/L (ref 3.5–5.1)
Sodium: 138 mmol/L (ref 135–145)
Total Bilirubin: 0.7 mg/dL (ref 0.3–1.2)
Total Protein: 6.9 g/dL (ref 6.0–8.3)

## 2014-07-03 LAB — CBC WITH DIFFERENTIAL/PLATELET
Basophils Absolute: 0 10*3/uL (ref 0.0–0.1)
Basophils Relative: 0 % (ref 0–1)
Eosinophils Absolute: 0 10*3/uL (ref 0.0–0.7)
Eosinophils Relative: 0 % (ref 0–5)
HCT: 41.1 % (ref 36.0–46.0)
Hemoglobin: 12.6 g/dL (ref 12.0–15.0)
Lymphocytes Relative: 12 % (ref 12–46)
Lymphs Abs: 1 10*3/uL (ref 0.7–4.0)
MCH: 28.4 pg (ref 26.0–34.0)
MCHC: 30.7 g/dL (ref 30.0–36.0)
MCV: 92.8 fL (ref 78.0–100.0)
Monocytes Absolute: 0.3 10*3/uL (ref 0.1–1.0)
Monocytes Relative: 4 % (ref 3–12)
Neutro Abs: 7.1 10*3/uL (ref 1.7–7.7)
Neutrophils Relative %: 84 % — ABNORMAL HIGH (ref 43–77)
Platelets: 239 10*3/uL (ref 150–400)
RBC: 4.43 MIL/uL (ref 3.87–5.11)
RDW: 14.9 % (ref 11.5–15.5)
WBC: 8.4 10*3/uL (ref 4.0–10.5)

## 2014-07-03 LAB — URINALYSIS, ROUTINE W REFLEX MICROSCOPIC
Bilirubin Urine: NEGATIVE
Glucose, UA: NEGATIVE mg/dL
Hgb urine dipstick: NEGATIVE
Ketones, ur: NEGATIVE mg/dL
Leukocytes, UA: NEGATIVE
Nitrite: NEGATIVE
Protein, ur: NEGATIVE mg/dL
Specific Gravity, Urine: 1.012 (ref 1.005–1.030)
Urobilinogen, UA: 0.2 mg/dL (ref 0.0–1.0)
pH: 6.5 (ref 5.0–8.0)

## 2014-07-03 LAB — GLUCOSE, CAPILLARY
Glucose-Capillary: 190 mg/dL — ABNORMAL HIGH (ref 70–99)
Glucose-Capillary: 212 mg/dL — ABNORMAL HIGH (ref 70–99)
Glucose-Capillary: 117 mg/dL — ABNORMAL HIGH (ref 70–99)
Glucose-Capillary: 175 mg/dL — ABNORMAL HIGH (ref 70–99)

## 2014-07-03 LAB — PROTIME-INR
INR: 0.97 (ref 0.00–1.49)
Prothrombin Time: 13 seconds (ref 11.6–15.2)

## 2014-07-03 LAB — PHOSPHORUS: Phosphorus: 2.9 mg/dL (ref 2.3–4.6)

## 2014-07-03 LAB — MAGNESIUM: Magnesium: 2.2 mg/dL (ref 1.5–2.5)

## 2014-07-03 LAB — APTT: aPTT: 28 seconds (ref 24–37)

## 2014-07-03 MED ORDER — OSELTAMIVIR PHOSPHATE 75 MG PO CAPS
75.0000 mg | ORAL_CAPSULE | Freq: Two times a day (BID) | ORAL | Status: DC
  Administered 2014-07-03 – 2014-07-07 (×8): 75 mg via ORAL
  Filled 2014-07-03 (×9): qty 1

## 2014-07-03 MED ORDER — IPRATROPIUM-ALBUTEROL 0.5-2.5 (3) MG/3ML IN SOLN
3.0000 mL | RESPIRATORY_TRACT | Status: DC
  Administered 2014-07-03 – 2014-07-05 (×14): 3 mL via RESPIRATORY_TRACT
  Filled 2014-07-03 (×14): qty 3

## 2014-07-03 MED ORDER — METHYLPREDNISOLONE SODIUM SUCC 125 MG IJ SOLR
80.0000 mg | Freq: Three times a day (TID) | INTRAMUSCULAR | Status: DC
  Administered 2014-07-03 – 2014-07-04 (×2): 80 mg via INTRAVENOUS
  Filled 2014-07-03 (×5): qty 1.28

## 2014-07-03 NOTE — Progress Notes (Signed)
[facility] PROGRESS NOTE  Sonia Drake NWG:[id number] DOB: 1955-12-05 DOA: 07/02/2014 PCP: Verlon Au, MD  Assessment Sonia Drake is a pleasant 59 y.o. 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 In any case, patient has Influenza A/B based on studies done on 06/29/14, resulted on 07/01/14. Patient states that her grandson has been sick for the last 2 weeks with cold-like symptoms. Recurrent symptoms at admission this time around prompted CT chest which shows "1. Mild diffuse bronchial wall thickening may be chronic, related to patient's underlying chronic bronchitis. However, there is a more severe focal bronchial wall thickening in the right lower lobe where there is a small amount of peribronchovascular ground-glass attenuation, which could indicate developing bronchopneumonia. This is very mild. 2. Two small 6 mm ground-glass attenuation nodules in the left lower lobe and right lower lobe, as above. Initial follow-up by chest CT without contrast is recommended in 3 months to confirm persistence. This recommendation follows the consensus statement: Recommendations for the Management of Subsolid Pulmonary Nodules Detected at CT: A Statement from the Fleischner Society as published in Radiology 2013; 266:304-317". Patient therefore has influenza pneumonia. Will therefore discontinue Vancomycin/Cefepime and start Tamiflu. Will place patient on droplet precautions. I have encouraged her to inform her family so they can get evaluated. Will continue bronchodilators/systemic steroids and oxygen supplementation as necessary. Will start to taper steroids. Patient is to follow with pulmonary for maintenance care upon discharge, especially in view of pulmonary nodules.  Patient is hyperglycemic but she denies prior history of diabetes. The hyperglycemia may therefore be steroid-induced. Hemoglobin A1c is pending. She is on sliding scale insulin for now. Her TSH is also a low. Will check free T4/T3 and determine need for further thyroid workup depending on the results. Plan Asthma/COPD with acute exacerbation/Influenza with pneumonia/Multiple lung nodules on CT  Droplet precautions  Follow Septic workup  Discontinue Vancomycin/cefepime. Tamiflu to complete 5 days  Solu-Medrol(start taper)/Duonebs/oxygen as needed Hyperglycemia/low TSH  Hemoglobin A1c/free T4/T3  SSI DVT/GI Prophylaxis  Lovenox  PPI  Code Status: Full Family Communication: No family members at bedside. Disposition Plan: Eventually home   Consultants:  None  Procedures:  None  Antibiotics:  Vancomycin 07/02/2014  Cefepime 07/02/2014  HPI/Subjective: "I feel 100% better"  Objective: Filed Vitals:   07/03/14 1439  BP: 136/60  Pulse: 93  Temp: 97.7 F (36.5 C)  Resp: 18    Intake/Output Summary (Last 24 hours) at 07/03/14 1510 Last data filed at 07/03/14 1300  Gross per 24 hour  Intake 1658.33 ml  Output    200 ml  Net 1458.33 ml   Filed Weights   07/02/14 1625  Weight: 93.8 kg (206 lb 12.7 oz)    Exam:   General:  Comfortable at rest.  Cardiovascular: S1-S2 normal. No murmurs. Pulse regular.  Respiratory: Good air entry bilaterally. No rhonchi or rales but scant wheezing.  Abdomen: Soft and nontender. Normal bowel sounds. No organomegaly.  Musculoskeletal: No pedal edema   Neurological: Intact  Data Reviewed: Basic Metabolic Panel:  Recent Labs Lab 06/28/14 2148 06/29/14 0850 06/30/14 0445 07/02/14 1212 07/02/14 1729 07/03/14 0449  NA 140 135 140 143  --  138  K 4.1 3.8 4.8 5.0  --  4.1  CL 103 100 104 105  --  99  CO2  30 25 29 28   --  31  GLUCOSE 95 218* 171* 199*  --  262*  BUN 7 9 10 11   --  10  CREATININE 0.93 0.85 0.74  0.78 0.83 0.77  CALCIUM 10.1 9.4 10.5 10.0  --  9.8  MG  --  1.7 2.2  --   --  2.2  PHOS  --   --   --   --   --  2.9   Liver Function Tests:  Recent Labs Lab 07/03/14 0449  AST 24  ALT 27  ALKPHOS 86  BILITOT 0.7  PROT 6.9  ALBUMIN 3.7   No results for input(s): LIPASE, AMYLASE in the last 168 hours. No results for input(s): AMMONIA in the last 168 hours. CBC:  Recent Labs Lab 06/29/14 0850 06/30/14 0445 07/02/14 1212 07/02/14 1729 07/03/14 0449  WBC 6.3 13.1* 10.3 8.8 8.4  NEUTROABS 5.7  --   --   --  7.1  HGB 11.8* 13.0 13.6 13.6 12.6  HCT 38.1 41.0 44.6 44.3 41.1  MCV 91.6 91.1 93.7 93.9 92.8  PLT 225 240 195 248 239   Cardiac Enzymes:  Recent Labs Lab 06/29/14 0210 06/29/14 0705  TROPONINI <0.03 <0.03   BNP (last 3 results)  Recent Labs  06/28/14 2149 07/02/14 1729  BNP 28.7 34.3    ProBNP (last 3 results) No results for input(s): PROBNP in the last 8760 hours.  CBG:  Recent Labs Lab 07/02/14 1631 07/02/14 2149 07/03/14 0724 07/03/14 1141  GLUCAP 196* 219* 190* 212*    Recent Results (from the past 240 hour(s))  Respiratory virus panel     Status: Abnormal   Collection Time: 06/29/14  2:09 AM  Result Value Ref Range Status   Source - RVPAN NOSE  Corrected   Respiratory Syncytial Virus A Negative Negative Final   Respiratory Syncytial Virus B Negative Negative Final   Influenza A Positive (A) Negative Final    Comment: Positive for Influenza A 2009 H1N1   Influenza B Positive (A) Negative Final   Parainfluenza 1 Negative Negative Final   Parainfluenza 2 Negative Negative Final   Parainfluenza 3 Negative Negative Final   Metapneumovirus Negative Negative Final   Rhinovirus Negative Negative Final   Adenovirus Negative Negative Final    Comment: (NOTE) Performed At: [facility] [address] [place], [place] [id number] Mila HomerHancock William F MD WG:[id number]:[phone]   Culture, blood (routine x 2) Call MD if unable to obtain prior to  antibiotics being given     Status: None (Preliminary result)   Collection Time: 06/29/14  2:10 AM  Result Value Ref Range Status   Specimen Description BLOOD LEFT ANTECUBITAL  Final   Special Requests BOTTLES DRAWN AEROBIC ONLY 6CC  Final   Culture   Final           BLOOD CULTURE RECEIVED NO GROWTH TO DATE CULTURE WILL BE HELD FOR 5 DAYS BEFORE ISSUING A FINAL NEGATIVE REPORT Performed at [facility]    Report Status PENDING  Incomplete  Culture, blood (routine x 2) Call MD if unable to obtain prior to antibiotics being given     Status: None (Preliminary result)   Collection Time: 06/29/14  2:20 AM  Result Value Ref Range Status   Specimen Description BLOOD LEFT FOREARM  Final   Special Requests BOTTLES DRAWN AEROBIC ONLY 6CC  Final   Culture   Final           BLOOD CULTURE RECEIVED NO  GROWTH TO DATE CULTURE WILL BE HELD FOR 5 DAYS BEFORE ISSUING A FINAL NEGATIVE REPORT Performed at [facility]    Report Status PENDING  Incomplete     Studies: Dg Chest 2 View (if Patient Has Fever And/or Copd)  07/02/2014   CLINICAL DATA:  Cough, congestion and shortness of breath. Intermittent wheeze for 2 weeks. History of COPD, asthma.  EXAM: CHEST  2 VIEW  COMPARISON:  06/28/2014 ; 08/30/2011  FINDINGS: Grossly unchanged cardiac silhouette and mediastinal contours. The lungs appear mildly hyperexpanded. No focal airspace opacities. No pleural effusion pneumothorax. No evidence of edema. No acute osseus abnormalities.  IMPRESSION: Mild lung hyperexpansion without acute cardiopulmonary disease.   Electronically Signed   By: Simonne Come M.D.   On: 07/02/2014 13:20   Ct Chest Wo Contrast  07/02/2014   CLINICAL DATA:  59 year old female with history of COPD with recurrent cough productive of brown colored sputum, wheezing and shortness of Breath.  EXAM: CT CHEST WITHOUT CONTRAST  TECHNIQUE: Multidetector CT imaging of the chest was performed following the standard protocol without IV  contrast.  COMPARISON:  No priors.  FINDINGS: Mediastinum/Lymph Nodes: Heart size is normal. There is no significant pericardial fluid, thickening or pericardial calcification. No pathologically enlarged mediastinal or hilar lymph nodes. Please note that accurate exclusion of hilar adenopathy is limited on noncontrast CT scans. Esophagus is unremarkable in appearance. No axillary lymphadenopathy.  Lungs/Pleura: Mild diffuse bronchial wall thickening. More severe focal bronchial wall thickening in the right lower lobe where there is some peribronchovascular ground-glass attenuation in the basal segments. No other consolidative airspace disease. Mild linear scarring in the inferior aspect of the left lower lobe. Two small 6 mm ground-glass attenuation nodules in the left lower lobe (image 29 of series 5), and in the periphery of the right lower lobe (image 36 of series 5). No other larger more suspicious appearing pulmonary nodules or masses are noted. No pleural effusions.  Upper Abdomen: Unremarkable.  Musculoskeletal/Soft Tissues: There are no aggressive appearing lytic or blastic lesions noted in the visualized portions of the skeleton.  IMPRESSION: 1. Mild diffuse bronchial wall thickening may be chronic, related to patient's underlying chronic bronchitis. However, there is a more severe focal bronchial wall thickening in the right lower lobe where there is a small amount of peribronchovascular ground-glass attenuation, which could indicate developing bronchopneumonia. This is very mild. 2. Two small 6 mm ground-glass attenuation nodules in the left lower lobe and right lower lobe, as above. Initial follow-up by chest CT without contrast is recommended in 3 months to confirm persistence. This recommendation follows the consensus statement: Recommendations for the Management of Subsolid Pulmonary Nodules Detected at CT: A Statement from the Fleischner Society as published in Radiology 2013; 266:304-317.    Electronically Signed   By: Trudie Reed M.D.   On: 07/02/2014 18:31    Scheduled Meds: . budesonide-formoterol  2 puff Inhalation BID  . calcium-vitamin D  1 tablet Oral Daily  . ceFEPime (MAXIPIME) IV  1 g Intravenous Q8H  . cholecalciferol  1,000 Units Oral Daily  . enoxaparin (LOVENOX) injection  40 mg Subcutaneous Q24H  . insulin aspart  0-9 Units Subcutaneous TID WC  . insulin detemir  6 Units Subcutaneous QHS  . ipratropium-albuterol  3 mL Nebulization Q4H  . methylPREDNISolone (SOLU-MEDROL) injection  80 mg Intravenous 3 times per day  . montelukast  10 mg Oral QHS  . pantoprazole  40 mg Oral Daily  . vancomycin  1,000 mg  Intravenous Q8H   Continuous Infusions: . sodium chloride 50 mL/hr at 07/03/14 0650     Time spent: 15 minutes    Yashar Inclan  [facility] Pager [phone]. If 7PM-7AM, please contact night-coverage at [email or web address], password [facility] 07/03/2014, 3:10 PM  LOS: 1 day

## 2014-07-04 LAB — BASIC METABOLIC PANEL
Anion gap: 11 (ref 5–15)
BUN: 11 mg/dL (ref 6–23)
CO2: 31 mmol/L (ref 19–32)
Calcium: 10.5 mg/dL (ref 8.4–10.5)
Chloride: 100 mmol/L (ref 96–112)
Creatinine, Ser: 0.7 mg/dL (ref 0.50–1.10)
GFR calc Af Amer: >90 mL/min (ref >90)
GFR calc non Af Amer: >90 mL/min (ref >90)
Glucose, Bld: 192 mg/dL — ABNORMAL HIGH (ref 70–99)
Potassium: 4.3 mmol/L (ref 3.5–5.1)
Sodium: 142 mmol/L (ref 135–145)

## 2014-07-04 LAB — T4, FREE: Free T4: 1.02 ng/dL (ref 0.80–1.80)

## 2014-07-04 LAB — GLUCOSE, CAPILLARY
Glucose-Capillary: 156 mg/dL — ABNORMAL HIGH (ref 70–99)
Glucose-Capillary: 251 mg/dL — ABNORMAL HIGH (ref 70–99)
Glucose-Capillary: 173 mg/dL — ABNORMAL HIGH (ref 70–99)
Glucose-Capillary: 155 mg/dL — ABNORMAL HIGH (ref 70–99)

## 2014-07-04 LAB — CBC
HCT: 41.1 % (ref 36.0–46.0)
Hemoglobin: 12.8 g/dL (ref 12.0–15.0)
MCH: 28.9 pg (ref 26.0–34.0)
MCHC: 31.1 g/dL (ref 30.0–36.0)
MCV: 92.8 fL (ref 78.0–100.0)
Platelets: 226 10*3/uL (ref 150–400)
RBC: 4.43 MIL/uL (ref 3.87–5.11)
RDW: 14.8 % (ref 11.5–15.5)
WBC: 16 10*3/uL — ABNORMAL HIGH (ref 4.0–10.5)

## 2014-07-04 LAB — LEGIONELLA ANTIGEN, URINE

## 2014-07-04 LAB — HEMOGLOBIN A1C
Hgb A1c MFr Bld: 6.1 % — ABNORMAL HIGH (ref 4.8–5.6)
Mean Plasma Glucose: 128 mg/dL

## 2014-07-04 MED ORDER — AMOXICILLIN-POT CLAVULANATE 875-125 MG PO TABS
1.0000 | ORAL_TABLET | Freq: Two times a day (BID) | ORAL | Status: AC
  Administered 2014-07-04 – 2014-07-06 (×6): 1 via ORAL
  Filled 2014-07-04 (×6): qty 1

## 2014-07-04 MED ORDER — PREDNISONE 20 MG PO TABS
40.0000 mg | ORAL_TABLET | Freq: Every day | ORAL | Status: DC
  Administered 2014-07-05 – 2014-07-06 (×2): 40 mg via ORAL
  Filled 2014-07-04 (×3): qty 2

## 2014-07-04 NOTE — Care Management Note (Signed)
CARE MANAGEMENT NOTE 07/04/2014  Patient:  Niel HummerSANDERS,Awilda J   Account Number:  [id number]  Date Initiated:  07/04/2014  Documentation initiated by:  Sandford CrazeLEMENTS,Joleigh Mineau  Subjective/Objective Assessment:   59 yo admitted with ASTHMA and COPD     Action/Plan:   From home with children   Anticipated DC Date:  07/07/2014   Anticipated DC Plan:  HOME/SELF CARE      DC Planning Services  CM consult      Choice offered to / List presented to:             Status of service:  In process, will continue to follow Medicare Important Message given?   (If response is "NO", the following Medicare IM given date fields will be blank) Date Medicare IM given:   Medicare IM given by:   Date Additional Medicare IM given:   Additional Medicare IM given by:    Discharge Disposition:    Per UR Regulation:  Reviewed for med. necessity/level of care/duration of stay  If discussed at Long Length of Stay Meetings, dates discussed:    Comments:  07/04/14 Sandford Crazeora Aleece Loyd RN,BSN,NCM [phone] Chart reviewed and CM following for DC needs.

## 2014-07-04 NOTE — Progress Notes (Signed)
[facility] PROGRESS NOTE  Sonia Drake ZOX:[id number] DOB: August 07, 1955 DOA: 07/02/2014 PCP: Verlon Au, MD  Summary Sonia Drake is a pleasant 59 y.o. 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 In any case, patient has Influenza A/B based on studies done on 06/29/14, resulted on 07/01/14. Patient states that her grandson has been sick for the last 2 weeks with cold-like symptoms. Recurrent symptoms at admission this time around prompted CT chest which showed "1. Mild diffuse bronchial wall thickening may be chronic, related to patient's underlying chronic bronchitis. However, there is a more severe focal bronchial wall thickening in the right lower lobe where there is a small amount of peribronchovascular ground-glass attenuation, which could indicate developing bronchopneumonia. This is very mild. 2. Two small 6 mm ground-glass attenuation nodules in the left lower lobe and right lower lobe, as above. Initial follow-up by chest CT without contrast is recommended in 3 months to confirm persistence. This recommendation follows the consensus statement: Recommendations for the Management of Subsolid Pulmonary Nodules Detected at CT: A Statement from the Fleischner Society as published in Radiology 2013; 266:304-317". Patient therefore has influenza pneumonia/?superimposed bacterial pneumonia causing acute exacerbation of COPD. Will add Augmentin to Tamiflu in case a bacterial pneumonia as well. Her white count is 16,000 today but likely this is steroid-induced as she says she feels better. Will continue droplet precautions. I have encouraged her to inform her family so they can get evaluated. Will continue bronchodilators/systemic steroids(switch to prednisone) and  oxygen supplementation as necessary. Patient is to follow with pulmonary for maintenance care upon discharge, especially in view of pulmonary nodules. Patient is hyperglycemic but she denies prior history of diabetes. The hyperglycemia may therefore be steroid-induced. Hemoglobin A1c is pending. She is on sliding scale insulin for now. Her TSH is low but free T4 is normal therefore this is likely sick euthyroid syndrome. Patient will likely DC in the next 24-48 hours she continues to do well Plan Asthma/COPD with acute exacerbation/Influenza with pneumonia/Multiple lung nodules on CT  Droplet precautions  Add Augmentin  Tamiflu to complete 5 days  Prednisone/Duonebs/oxygen as needed Hyperglycemia/sick euthyroid syndrome  Follow Hemoglobin A1c  SSI DVT/GI Prophylaxis  Lovenox  PPI  Code Status: Full Family Communication: No family members at bedside. Disposition Plan: Eventually home   Consultants:  None  Procedures:  None  Antibiotics:  Vancomycin 07/02/2014>07/03/14  Cefepime 07/02/2014>07/03/14  Tamiflu 07/03/2014>  Augmentin 07/04/2014>  HPI/Subjective: Feels better.  Objective:  Objective: Filed Vitals:   07/04/14 0534  BP: 100/46  Pulse: 80  Temp: 97.6 F (36.4 C)  Resp: 20    Intake/Output Summary (Last 24 hours) at 07/04/14 1024 Last data filed at 07/04/14 0900  Gross per 24 hour  Intake 2249.16 ml  Output    700 ml  Net 1549.16 ml   Filed Weights   07/02/14 1625  Weight: 93.8 kg (206 lb 12.7 oz)    Exam:   General:  Comfortable at rest.  Cardiovascular: S1-S2 normal. No murmurs. Pulse regular.  Respiratory: Good air entry bilaterally. No rhonchi or rales.  Abdomen: Soft and nontender. Normal bowel sounds. No organomegaly.  Musculoskeletal: No pedal edema   Neurological: Intact  Data Reviewed: Basic Metabolic Panel:  Recent Labs Lab 06/29/14 0850 06/30/14 0445 07/02/14 1212 07/02/14 1729 07/03/14 0449 07/04/14  0415   NA 135 140 143  --  138 142  K 3.8 4.8 5.0  --  4.1 4.3  CL 100 104 105  --  99 100  CO2 25 29 28   --  31 31  GLUCOSE 218* 171* 199*  --  262* 192*  BUN 9 10 11   --  10 11  CREATININE 0.85 0.74 0.78 0.83 0.77 0.70  CALCIUM 9.4 10.5 10.0  --  9.8 10.5  MG 1.7 2.2  --   --  2.2  --   PHOS  --   --   --   --  2.9  --    Liver Function Tests:  Recent Labs Lab 07/03/14 0449  AST 24  ALT 27  ALKPHOS 86  BILITOT 0.7  PROT 6.9  ALBUMIN 3.7   No results for input(s): LIPASE, AMYLASE in the last 168 hours. No results for input(s): AMMONIA in the last 168 hours. CBC:  Recent Labs Lab 06/29/14 0850 06/30/14 0445 07/02/14 1212 07/02/14 1729 07/03/14 0449 07/04/14 0415  WBC 6.3 13.1* 10.3 8.8 8.4 16.0*  NEUTROABS 5.7  --   --   --  7.1  --   HGB 11.8* 13.0 13.6 13.6 12.6 12.8  HCT 38.1 41.0 44.6 44.3 41.1 41.1  MCV 91.6 91.1 93.7 93.9 92.8 92.8  PLT 225 240 195 248 239 226   Cardiac Enzymes:  Recent Labs Lab 06/29/14 0210 06/29/14 0705  TROPONINI <0.03 <0.03   BNP (last 3 results)  Recent Labs  06/28/14 2149 07/02/14 1729  BNP 28.7 34.3    ProBNP (last 3 results) No results for input(s): PROBNP in the last 8760 hours.  CBG:  Recent Labs Lab 07/03/14 0724 07/03/14 1141 07/03/14 1709 07/03/14 2135 07/04/14 0734  GLUCAP 190* 212* 117* 175* 156*    Recent Results (from the past 240 hour(s))  Respiratory virus panel     Status: Abnormal   Collection Time: 06/29/14  2:09 AM  Result Value Ref Range Status   Source - RVPAN NOSE  Corrected   Respiratory Syncytial Virus A Negative Negative Final   Respiratory Syncytial Virus B Negative Negative Final   Influenza A Positive (A) Negative Final    Comment: Positive for Influenza A 2009 H1N1   Influenza B Positive (A) Negative Final   Parainfluenza 1 Negative Negative Final   Parainfluenza 2 Negative Negative Final   Parainfluenza 3 Negative Negative Final   Metapneumovirus Negative Negative Final    Rhinovirus Negative Negative Final   Adenovirus Negative Negative Final    Comment: (NOTE) Performed At: [facility] [address] [place], [place] [id number] Mila HomerHancock William F MD IO:[phone]   Culture, blood (routine x 2) Call MD if unable to obtain prior to antibiotics being given     Status: None (Preliminary result)   Collection Time: 06/29/14  2:10 AM  Result Value Ref Range Status   Specimen Description BLOOD LEFT ANTECUBITAL  Final   Special Requests BOTTLES DRAWN AEROBIC ONLY 6CC  Final   Culture   Final           BLOOD CULTURE RECEIVED NO GROWTH TO DATE CULTURE WILL BE HELD FOR 5 DAYS BEFORE ISSUING A FINAL NEGATIVE REPORT [facility]formed at [facility]    Report Status PENDING  Incomplete  Culture, blood (routine x 2) Call MD if unable to obtain prior to antibiotics being given     Status: None (Preliminary result)   Collection Time: 06/29/14  2:20 AM  Result Value Ref Range Status   Specimen Description BLOOD LEFT FOREARM  Final   Special Requests BOTTLES DRAWN AEROBIC ONLY 6CC  Final   Culture   Final           BLOOD CULTURE RECEIVED NO GROWTH TO DATE CULTURE WILL BE HELD FOR 5 DAYS BEFORE ISSUING A FINAL NEGATIVE REPORT Performed at [facility]    Report Status PENDING  Incomplete     Studies: Dg Chest 2 View (if Patient Has Fever And/or Copd)  07/02/2014   CLINICAL DATA:  Cough, congestion and shortness of breath. Intermittent wheeze for 2 weeks. History of COPD, asthma.  EXAM: CHEST  2 VIEW  COMPARISON:  06/28/2014 ; 08/30/2011  FINDINGS: Grossly unchanged cardiac silhouette and mediastinal contours. The lungs appear mildly hyperexpanded. No focal airspace opacities. No pleural effusion pneumothorax. No evidence of edema. No acute osseus abnormalities.  IMPRESSION: Mild lung hyperexpansion without acute cardiopulmonary disease.   Electronically Signed   By: Simonne Come M.D.   On: 07/02/2014 13:20   Ct Chest Wo Contrast  07/02/2014   CLINICAL  DATA:  59 year old female with history of COPD with recurrent cough productive of brown colored sputum, wheezing and shortness of Breath.  EXAM: CT CHEST WITHOUT CONTRAST  TECHNIQUE: Multidetector CT imaging of the chest was performed following the standard protocol without IV contrast.  COMPARISON:  No priors.  FINDINGS: Mediastinum/Lymph Nodes: Heart size is normal. There is no significant pericardial fluid, thickening or pericardial calcification. No pathologically enlarged mediastinal or hilar lymph nodes. Please note that accurate exclusion of hilar adenopathy is limited on noncontrast CT scans. Esophagus is unremarkable in appearance. No axillary lymphadenopathy.  Lungs/Pleura: Mild diffuse bronchial wall thickening. More severe focal bronchial wall thickening in the right lower lobe where there is some peribronchovascular ground-glass attenuation in the basal segments. No other consolidative airspace disease. Mild linear scarring in the inferior aspect of the left lower lobe. Two small 6 mm ground-glass attenuation nodules in the left lower lobe (image 29 of series 5), and in the periphery of the right lower lobe (image 36 of series 5). No other larger more suspicious appearing pulmonary nodules or masses are noted. No pleural effusions.  Upper Abdomen: Unremarkable.  Musculoskeletal/Soft Tissues: There are no aggressive appearing lytic or blastic lesions noted in the visualized portions of the skeleton.  IMPRESSION: 1. Mild diffuse bronchial wall thickening may be chronic, related to patient's underlying chronic bronchitis. However, there is a more severe focal bronchial wall thickening in the right lower lobe where there is a small amount of peribronchovascular ground-glass attenuation, which could indicate developing bronchopneumonia. This is very mild. 2. Two small 6 mm ground-glass attenuation nodules in the left lower lobe and right lower lobe, as above. Initial follow-up by chest CT without contrast is  recommended in 3 months to confirm persistence. This recommendation follows the consensus statement: Recommendations for the Management of Subsolid Pulmonary Nodules Detected at CT: A Statement from the Fleischner Society as published in Radiology 2013; 266:304-317.   Electronically Signed   By: Trudie Reed M.D.   On: 07/02/2014 18:31    Scheduled Meds: . amoxicillin-clavulanate  1 tablet Oral Q12H  . budesonide-formoterol  2 puff Inhalation BID  . calcium-vitamin D  1 tablet Oral Daily  . cholecalciferol  1,000 Units Oral Daily  . enoxaparin (LOVENOX) injection  40 mg Subcutaneous Q24H  . insulin aspart  0-9 Units Subcutaneous TID WC  . insulin detemir  6 Units Subcutaneous QHS  .  ipratropium-albuterol  3 mL Nebulization Q4H  . montelukast  10 mg Oral QHS  . oseltamivir  75 mg Oral BID  . pantoprazole  40 mg Oral Daily  . predniSONE  40 mg Oral Q breakfast   Continuous Infusions:    Time spent: 15 minutes    Charda Janis  [facility] Pager [phone]. If 7PM-7AM, please contact night-coverage at [email or web address], password [facility] 07/04/2014, 10:24 AM  LOS: 2 days

## 2014-07-05 LAB — GLUCOSE, CAPILLARY
Glucose-Capillary: 106 mg/dL — ABNORMAL HIGH (ref 70–99)
Glucose-Capillary: 127 mg/dL — ABNORMAL HIGH (ref 70–99)
Glucose-Capillary: 221 mg/dL — ABNORMAL HIGH (ref 70–99)
Glucose-Capillary: 386 mg/dL — ABNORMAL HIGH (ref 70–99)

## 2014-07-05 LAB — CBC
HCT: 41.1 % (ref 36.0–46.0)
Hemoglobin: 12.3 g/dL (ref 12.0–15.0)
MCH: 27.9 pg (ref 26.0–34.0)
MCHC: 29.9 g/dL — ABNORMAL LOW (ref 30.0–36.0)
MCV: 93.2 fL (ref 78.0–100.0)
Platelets: 238 10*3/uL (ref 150–400)
RBC: 4.41 MIL/uL (ref 3.87–5.11)
RDW: 14.6 % (ref 11.5–15.5)
WBC: 16.8 10*3/uL — ABNORMAL HIGH (ref 4.0–10.5)

## 2014-07-05 LAB — CULTURE, BLOOD (ROUTINE X 2)
Culture: NO GROWTH
Culture: NO GROWTH

## 2014-07-05 LAB — BASIC METABOLIC PANEL
Anion gap: 5 (ref 5–15)
BUN: 14 mg/dL (ref 6–23)
CO2: 36 mmol/L — ABNORMAL HIGH (ref 19–32)
Calcium: 9.9 mg/dL (ref 8.4–10.5)
Chloride: 98 mmol/L (ref 96–112)
Creatinine, Ser: 0.74 mg/dL (ref 0.50–1.10)
GFR calc Af Amer: >90 mL/min (ref >90)
GFR calc non Af Amer: >90 mL/min (ref >90)
Glucose, Bld: 135 mg/dL — ABNORMAL HIGH (ref 70–99)
Potassium: 4.4 mmol/L (ref 3.5–5.1)
Sodium: 139 mmol/L (ref 135–145)

## 2014-07-05 LAB — T3, FREE: T3, Free: 1.6 pg/mL — ABNORMAL LOW (ref 2.0–4.4)

## 2014-07-05 LAB — HEMOGLOBIN A1C
Hgb A1c MFr Bld: 6.5 % — ABNORMAL HIGH (ref 4.8–5.6)
Mean Plasma Glucose: 140 mg/dL

## 2014-07-05 MED ORDER — IPRATROPIUM-ALBUTEROL 0.5-2.5 (3) MG/3ML IN SOLN
3.0000 mL | Freq: Four times a day (QID) | RESPIRATORY_TRACT | Status: DC
  Administered 2014-07-05 – 2014-07-06 (×3): 3 mL via RESPIRATORY_TRACT
  Filled 2014-07-05 (×3): qty 3

## 2014-07-05 MED ORDER — OSELTAMIVIR PHOSPHATE 75 MG PO CAPS: 75.0000 mg | ORAL_CAPSULE | Freq: Two times a day (BID) | ORAL | Status: DC

## 2014-07-05 NOTE — Discharge Summary (Signed)
Sonia Drake, is a 59 y.o. female  DOB [birth year]  MRN [id number].  Admission date:  07/02/2014  Admitting Physician  Conley Canal, MD  Discharge Date:  07/05/2014   Primary MD  Verlon Au, MD  Recommendations for primary care physician for things to follow:  Please follow lung nodules with CT chest in 6-9 months   Admission Diagnosis   COPD with acute exacerbation   Discharge Diagnosis  COPD with acute exacerbation   Active Problems:   Asthma   COPD with acute exacerbation   Hyperglycemia   Sick-euthyroid syndrome   Influenza with pneumonia   Multiple lung nodules on CT      Hospital Course  Sonia Drake is a very pleasant 59 y.o. 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 In any case, patient was found to have Influenza A/B based on studies done on 06/29/14, resulted on 07/01/14. Patient states that her grandson has been sick for the last 2 weeks with cold-like symptoms. Recurrent symptoms at admission this time around prompted CT chest which showed "1. Mild diffuse bronchial wall thickening may be chronic, related to patient's underlying chronic bronchitis. However, there is a more severe focal bronchial wall thickening in the right lower lobe where there is a small amount of peribronchovascular ground-glass attenuation, which could indicate developing bronchopneumonia. This is very mild. 2. Two small 6 mm ground-glass attenuation nodules in the left lower lobe and right lower lobe, as above. Initial follow-up by chest CT without contrast is recommended in 3 months to  confirm persistence. This recommendation follows the consensus statement: Recommendations for the Management of Subsolid Pulmonary Nodules Detected at CT: A Statement from the Fleischner Society as published in Radiology 2013; 266:304-317". Patient therefore has influenza pneumonia/?superimposed bacterial pneumonia causing acute exacerbation of COPD. She received Augmentin and Tamiflu inhouse and she will discharge on Tamiflu to complete the remaining dose of Levaquin and steroids in a.m. on 07/06/2014. Her white count is elevated at 16,000 but likely this is steroid-induced granulocytosis as she says she feels better. I have encouraged her to inform her family so they can get evaluated and possibly get secondary prophylaxis for influenza. Patient is to follow with pulmonary for maintenance care upon discharge, especially in view of pulmonary nodules. Patient is hyperglycemic but she denies prior history of diabetes, hemoglobin A1c 6.5 pointing towards steroid-induced hyperglycemia.  Her TSH is low but free T4 is normal therefore this is likely sick euthyroid syndrome. Patient will DC in am if her condition remains stable.  Discharge Condition Stable.  Consults obtained  None  Follow UP Pulmonary    Discharge Instructions  and  Discharge Medications  Discharge Instructions    Diet - low sodium heart healthy    Complete by:  As directed      Increase activity slowly  Complete by:  As directed             Medication List    TAKE these medications        albuterol 108 (90 BASE) MCG/ACT inhaler  Commonly known as:  PROVENTIL HFA;VENTOLIN HFA  Inhale into the lungs every 6 (six) hours as needed for wheezing or shortness of breath.     albuterol (2.5 MG/3ML) 0.083% nebulizer solution  Commonly known as:  PROVENTIL  Take 2.5 mg by nebulization every 4 (four) hours as needed for wheezing or shortness of breath.     AZELASTINE HCL NA  Place 1 spray into the nose 2 (two) times daily.      budesonide-formoterol 160-4.5 MCG/ACT inhaler  Commonly known as:  SYMBICORT  Inhale 2 puffs into the lungs 2 (two) times daily.     calcium-vitamin D 500-200 MG-UNIT per tablet  Commonly known as:  OSCAL WITH D  Take 1 tablet by mouth daily.     cetirizine 10 MG tablet  Commonly known as:  ZYRTEC  Take 10 mg by mouth every evening.     cholecalciferol 1000 UNITS tablet  Commonly known as:  VITAMIN D  Take 1,000 Units by mouth daily.     dextromethorphan-guaiFENesin 30-600 MG per 12 hr tablet  Commonly known as:  MUCINEX DM  Take 1 tablet by mouth 2 (two) times daily. Use for 4 days then as needed.     ipratropium-albuterol 0.5-2.5 (3) MG/3ML Soln  Commonly known as:  DUONEB  Take 3 mLs by nebulization every 6 (six) hours as needed. Use 3 times daily x 4 days then as needed.     levofloxacin 500 MG tablet  Commonly known as:  LEVAQUIN  Take 1 tablet (500 mg total) by mouth at bedtime. Take for 4 days.     montelukast 10 MG tablet  Commonly known as:  SINGULAIR  Take 10 mg by mouth at bedtime.     oseltamivir 75 MG capsule  Commonly known as:  TAMIFLU  Take 1 capsule (75 mg total) by mouth 2 (two) times daily.     pantoprazole 20 MG tablet  Commonly known as:  PROTONIX  Take 20 mg by mouth daily as needed for indigestion.     predniSONE 20 MG tablet  Commonly known as:  DELTASONE  Take 1-3 tablets (20-60 mg total) by mouth daily with breakfast. Take 3 tablets (60mg ) daily x 3 days, then 2 tablets (40mg ) daily x 3 days, then 1 tablet (20mg ) daily x 3 days then stop.     tiotropium 18 MCG inhalation capsule  Commonly known as:  SPIRIVA  Place 18 mcg into inhaler and inhale daily.        Diet and Activity recommendation: See Discharge Instructions above  Major procedures and Radiology Reports - PLEASE review detailed and final reports for all details, in brief -    Dg Chest 2 View (if Patient Has Fever And/or Copd)  07/02/2014   CLINICAL DATA:  Cough, congestion  and shortness of breath. Intermittent wheeze for 2 weeks. History of COPD, asthma.  EXAM: CHEST  2 VIEW  COMPARISON:  06/28/2014 ; 08/30/2011  FINDINGS: Grossly unchanged cardiac silhouette and mediastinal contours. The lungs appear mildly hyperexpanded. No focal airspace opacities. No pleural effusion pneumothorax. No evidence of edema. No acute osseus abnormalities.  IMPRESSION: Mild lung hyperexpansion without acute cardiopulmonary disease.   Electronically Signed   By: Simonne ComeJohn  Watts M.D.   On: 07/02/2014 13:20  Ct Chest Wo Contrast  07/02/2014   CLINICAL DATA:  59 year old female with history of COPD with recurrent cough productive of brown colored sputum, wheezing and shortness of Breath.  EXAM: CT CHEST WITHOUT CONTRAST  TECHNIQUE: Multidetector CT imaging of the chest was performed following the standard protocol without IV contrast.  COMPARISON:  No priors.  FINDINGS: Mediastinum/Lymph Nodes: Heart size is normal. There is no significant pericardial fluid, thickening or pericardial calcification. No pathologically enlarged mediastinal or hilar lymph nodes. Please note that accurate exclusion of hilar adenopathy is limited on noncontrast CT scans. Esophagus is unremarkable in appearance. No axillary lymphadenopathy.  Lungs/Pleura: Mild diffuse bronchial wall thickening. More severe focal bronchial wall thickening in the right lower lobe where there is some peribronchovascular ground-glass attenuation in the basal segments. No other consolidative airspace disease. Mild linear scarring in the inferior aspect of the left lower lobe. Two small 6 mm ground-glass attenuation nodules in the left lower lobe (image 29 of series 5), and in the periphery of the right lower lobe (image 36 of series 5). No other larger more suspicious appearing pulmonary nodules or masses are noted. No pleural effusions.  Upper Abdomen: Unremarkable.  Musculoskeletal/Soft Tissues: There are no aggressive appearing lytic or blastic  lesions noted in the visualized portions of the skeleton.  IMPRESSION: 1. Mild diffuse bronchial wall thickening may be chronic, related to patient's underlying chronic bronchitis. However, there is a more severe focal bronchial wall thickening in the right lower lobe where there is a small amount of peribronchovascular ground-glass attenuation, which could indicate developing bronchopneumonia. This is very mild. 2. Two small 6 mm ground-glass attenuation nodules in the left lower lobe and right lower lobe, as above. Initial follow-up by chest CT without contrast is recommended in 3 months to confirm persistence. This recommendation follows the consensus statement: Recommendations for the Management of Subsolid Pulmonary Nodules Detected at CT: A Statement from the Fleischner Society as published in Radiology 2013; 266:304-317.   Electronically Signed   By: Trudie Reed M.D.   On: 07/02/2014 18:31   Dg Chest Port 1 View  06/28/2014   CLINICAL DATA:  Cough, congestion, shortness of breath and chest pain for 2 days.  EXAM: PORTABLE CHEST - 1 VIEW  COMPARISON:  Frontal and lateral views earlier this day at 1930 hour at [facility].  FINDINGS: Mild elevation of left hemidiaphragm. The bibasilar densities on prior exam are best appreciated on the lateral view, not well seen currently. Minimal bibasilar atelectasis suspected. The heart size is normal. Pulmonary vasculature is normal. No pleural effusion or pneumothorax. No acute osseous abnormalities.  IMPRESSION: Bibasilar atelectasis. This was better appreciated on exam 2 hours prior on the lateral view.   Electronically Signed   By: Rubye Oaks M.D.   On: 06/28/2014 21:54    Micro Results   Recent Results (from the past 240 hour(s))  Respiratory virus panel     Status: Abnormal   Collection Time: 06/29/14  2:09 AM  Result Value Ref Range Status   Source - RVPAN NOSE  Corrected   Respiratory Syncytial Virus A Negative Negative Final    Respiratory Syncytial Virus B Negative Negative Final   Influenza A Positive (A) Negative Final    Comment: Positive for Influenza A 2009 H1N1   Influenza B Positive (A) Negative Final   Parainfluenza 1 Negative Negative Final   Parainfluenza 2 Negative Negative Final   Parainfluenza 3 Negative Negative Final   Metapneumovirus Negative Negative Final  Rhinovirus Negative Negative Final   Adenovirus Negative Negative Final    Comment: (NOTE) Performed At: [facility] [address] [place], [place] [id number] [name] MD WU:[id number]   Culture, blood (routine x 2) Call MD if unable to obtain prior to antibiotics being given     Status: None   Collection Time: 06/29/14  2:10 AM  Result Value Ref Range Status   Specimen Description BLOOD LEFT ANTECUBITAL  Final   Special Requests BOTTLES DRAWN AEROBIC ONLY 6CC  Final   Culture   Final    NO GROWTH 5 DAYS Performed at [facility]    Report Status 07/05/2014 FINAL  Final  Culture, blood (routine x 2) Call MD if unable to obtain prior to antibiotics being given     Status: None   Collection Time: 06/29/14  2:20 AM  Result Value Ref Range Status   Specimen Description BLOOD LEFT FOREARM  Final   Special Requests BOTTLES DRAWN AEROBIC ONLY 6CC  Final   Culture   Final    NO GROWTH 5 DAYS Performed at [facility]    Report Status 07/05/2014 FINAL  Final       Today   Subjective:   Sonia Drake today has no headache,no chest abdominal pain,no new weakness tingling or numbness, feels much better wants to go home today.   Objective:   Blood pressure 133/85, pulse 104, temperature 97.8 F (36.6 C), temperature source Oral, resp. rate 18, height  (1.626 m), weight 93.8 kg (206 lb 12.7 oz), SpO2 95 %.   Intake/Output Summary (Last 24 hours) at 07/05/14 2132 Last data filed at 07/05/14 2023  Gross per 24 hour  Intake    720 ml  Output   1650 ml  Net   -930 ml    Exam Awake Alert,  Oriented x 3, No new F.N deficits, Normal affect [place].AT,PERRAL Supple Neck,No JVD, No cervical lymphadenopathy appriciated.  Symmetrical Chest wall movement, Good air movement bilaterally, CTAB RRR,No Gallops,Rubs or new Murmurs, No Parasternal Heave +ve B.Sounds, Abd Soft, Non tender, No organomegaly appriciated, No rebound -guarding or rigidity. No Cyanosis, Clubbing or edema, No new Rash or bruise  Data Review   CBC w Diff: Lab Results  Component Value Date   WBC 16.8* 07/05/2014   HGB 12.3 07/05/2014   HCT 41.1 07/05/2014   PLT 238 07/05/2014   LYMPHOPCT 12 07/03/2014   MONOPCT 4 07/03/2014   EOSPCT 0 07/03/2014   BASOPCT 0 07/03/2014    CMP: Lab Results  Component Value Date   NA 139 07/05/2014   K 4.4 07/05/2014   CL 98 07/05/2014   CO2 36* 07/05/2014   BUN 14 07/05/2014   CREATININE 0.74 07/05/2014   PROT 6.9 07/03/2014   ALBUMIN 3.7 07/03/2014   BILITOT 0.7 07/03/2014   ALKPHOS 86 07/03/2014   AST 24 07/03/2014   ALT 27 07/03/2014  .   Total Time in preparing paper work, data evaluation and todays exam - 25 minutes  Sonia Drake M.D on 07/05/2014 at 9:32 PM  [facility] Office  [phone]

## 2014-07-05 NOTE — Plan of Care (Signed)
Problem: Phase I Progression Outcomes Goal: OOB as tolerated unless otherwise ordered Outcome: Progressing Patient ambulated in the hallway.     

## 2014-07-06 DIAGNOSIS — E0781 Sick-euthyroid syndrome: Secondary | ICD-10-CM

## 2014-07-06 LAB — GLUCOSE, CAPILLARY
Glucose-Capillary: 102 mg/dL — ABNORMAL HIGH (ref 70–99)
Glucose-Capillary: 129 mg/dL — ABNORMAL HIGH (ref 70–99)
Glucose-Capillary: 165 mg/dL — ABNORMAL HIGH (ref 70–99)
Glucose-Capillary: 292 mg/dL — ABNORMAL HIGH (ref 70–99)

## 2014-07-06 MED ORDER — IPRATROPIUM-ALBUTEROL 0.5-2.5 (3) MG/3ML IN SOLN
3.0000 mL | RESPIRATORY_TRACT | Status: DC
  Administered 2014-07-06 – 2014-07-07 (×7): 3 mL via RESPIRATORY_TRACT
  Filled 2014-07-06 (×6): qty 3

## 2014-07-06 MED ORDER — METHYLPREDNISOLONE SODIUM SUCC 125 MG IJ SOLR
60.0000 mg | Freq: Four times a day (QID) | INTRAMUSCULAR | Status: DC
  Administered 2014-07-06 – 2014-07-07 (×4): 60 mg via INTRAVENOUS
  Filled 2014-07-06 (×8): qty 0.96

## 2014-07-06 NOTE — Progress Notes (Signed)
PROGRESS NOTE  Sonia HummerMary J Drake XBJ:[id number] DOB: 08/29/1955 DOA: 07/02/2014 PCP: Verlon AuBoyd, Tammy Lamonica, MD  HPI: Sonia HaywardMarry Drake is a 59yo female with a history of COPD and asthma who presented to the ED on 3/12 with increased shortness of breath, wheezing, and a worsened productive cough with brown sputum due to an acute exacerbation of COPD/asthma. She was discharged from the hospital on 3/10 with similar symptoms and reported taking her Levaquin, bronchodilators, and systemic steroids as prescribed. Negative blood cultures, legionella. CXR showed mild lung hyperexpansion without acute cardiopulmonary disease. Denies history of smoking.  Subjective: Sonia Drake was supposed to be discharged today; however, wheezing and shortness of breath are still present. Patient experienced dyspnea at rest while talking and could maintain a 91% O2 sat at rest on room air. While walking to the nursing station with the patient, she had to stop multiple times to catch her breath and was satting between 91-92% on room air. She feels very weak. Daughter said that while she has DOE and dyspnea at rest at home, she has not returned to her baseline yet. Denies chest pain, palpitations, numbness, N/V, diarrhea, or constipation.  Assessment/Plan:  Acute COPD/Asthma exacerbation  - Increased wheezing and shortness of breath  - Increased Duoneb to Q4 hours instead of Q6 hours - Started Solumedrol 60mg  four times daily - Continue Symbicort & Singular dosing - Last dose of Augmentin tonight - O2 to keep sats between 88-92%  - Albuterol Q2 hours PRN for wheezing - Will reassess in the morning to see if improved after increased dosing of solumedrol and duoneb - PT eval for weakness   Influenza A & B  - Had one negative test on 3/8 and a + test on 3/9 for types A & B  - WBC 16.8 increased  - Continue planned 5 day course of tamiflu  Hyperglycemia  - CBG 102; A1C 6.5  - Most likely due to steroid use  - SSI  - Levemir  6 units QHS  Pulmonary nodules  - CT showed 2 6mm ground glass nodules in LLL and RLL - Repeat noncontrast CT in 3 mo  Acute Diastolic Heart Failure  - 2D ECHO on 3/9 showed 60-65% EF with grade 2 diastolic dysfunction    DVT Prophylaxis:  Lovenox 40mg   Code Status: Full  Family Communication: Discussed with patient and daughter Disposition Plan: Inpatient, likely home tomorrow with daughter and home health   Consultants:  None  Procedures:  None  Antibiotics:  Augmentin 3/14 - will stop today   Objective: Filed Vitals:   07/05/14 1933 07/05/14 2022 07/06/14 0231 07/06/14 0902  BP:  133/85    Pulse:  104    Temp:  97.8 F (36.6 C)    TempSrc:  Oral    Resp:  18    Height:      Weight:      SpO2: 99% 95% 94% 90%    Intake/Output Summary (Last 24 hours) at 07/06/14 1050 Last data filed at 07/06/14 0455  Gross per 24 hour  Intake    480 ml  Output   1900 ml  Net  -1420 ml   Filed Weights   07/02/14 1625  Weight: 93.8 kg (206 lb 12.7 oz)    Exam: General: Well developed, well nourished, appears stated age, appears to be in mild respiratory distress, daughter at bedside. Slightly tearful when talking about not being discharged today HEENT:  PER, Anicteic Sclera, MMM.  Neck: Supple, no JVD, no masses  Cardiovascular: RRR, S1 S2 auscultated, no rubs, murmurs or gallops.   Respiratory: Decreased breath sounds, long expiratory phase with diffuse wheezing Abdomen: Soft, nontender, nondistended, + bowel sounds  Extremities: warm dry without cyanosis clubbing or edema.  Neuro: AAOx3, cranial nerves grossly intact.  Skin: Without rashes exudates or nodules.   Psych: Normal affect and demeanor with intact judgement and insight.   Data Reviewed: Basic Metabolic Panel:  Recent Labs Lab 06/30/14 0445 07/02/14 1212 07/02/14 1729 07/03/14 0449 07/04/14 0415 07/05/14 0404  NA 140 143  --  138 142 139  K 4.8 5.0  --  4.1 4.3 4.4  CL 104 105  --  99 100 98    CO2 29 28  --  31 31 36*  GLUCOSE 171* 199*  --  262* 192* 135*  BUN 10 11  --  CREATININE 0.74 0.78 0.83 0.77 0.70 0.74  CALCIUM 10.5 10.0  --  9.8 10.5 9.9  MG 2.2  --   --  2.2  --   --   PHOS  --   --   --  2.9  --   --    Liver Function Tests:  Recent Labs Lab 07/03/14 0449  AST 24  ALT 27  ALKPHOS 86  BILITOT 0.7  PROT 6.9  ALBUMIN 3.7   CBC:  Recent Labs Lab 07/02/14 1212 07/02/14 1729 07/03/14 0449 07/04/14 0415 07/05/14 0404  WBC 10.3 8.8 8.4 16.0* 16.8*  NEUTROABS  --   --  7.1  --   --   HGB 13.6 13.6 12.6 12.8 12.3  HCT 44.6 44.3 41.1 41.1 41.1  MCV 93.7 93.9 92.8 92.8 93.2  PLT 195 248 239 226 238   BNP (last 3 results)  Recent Labs  06/28/14 2149 07/02/14 1729  BNP 28.7 34.3    CBG:  Recent Labs Lab 07/05/14 0756 07/05/14 1221 07/05/14 1724 07/05/14 2020 07/06/14 0742  GLUCAP 106* 127* 221* 386* 102*    Recent Results (from the past 240 hour(s))  Respiratory virus panel     Status: Abnormal   Collection Time: 06/29/14  2:09 AM  Result Value Ref Range Status   Source - RVPAN NOSE  Corrected   Respiratory Syncytial Virus A Negative Negative Final   Respiratory Syncytial Virus B Negative Negative Final   Influenza A Positive (A) Negative Final    Comment: Positive for Influenza A 2009 H1N1   Influenza B Positive (A) Negative Final   Parainfluenza 1 Negative Negative Final   Parainfluenza 2 Negative Negative Final   Parainfluenza 3 Negative Negative Final   Metapneumovirus Negative Negative Final   Rhinovirus Negative Negative Final   Adenovirus Negative Negative Final    Comment: (NOTE) Performed At: [facility] [address] [place], [place] [id number] [name] MD WU:[id number]   Culture, blood (routine x 2) Call MD if unable to obtain prior to antibiotics being given     Status: None   Collection Time: 06/29/14  2:10 AM  Result Value Ref Range Status   Specimen Description BLOOD LEFT  ANTECUBITAL  Final   Special Requests BOTTLES DRAWN AEROBIC ONLY 6CC  Final   Culture   Final    NO GROWTH 5 DAYS Performed at [facility]    Report Status 07/05/2014 FINAL  Final  Culture, blood (routine x 2) Call MD if unable to obtain prior to antibiotics being given     Status: None   Collection Time: 06/29/14  2:20 AM  Result Value Ref Range Status   Specimen Description BLOOD LEFT FOREARM  Final   Special Requests BOTTLES DRAWN AEROBIC ONLY 6CC  Final   Culture   Final    NO GROWTH 5 DAYS Performed at [facility]    Report Status 07/05/2014 FINAL  Final     Scheduled Meds: . amoxicillin-clavulanate  1 tablet Oral Q12H  . budesonide-formoterol  2 puff Inhalation BID  . calcium-vitamin D  1 tablet Oral Daily  . cholecalciferol  1,000 Units Oral Daily  . enoxaparin (LOVENOX) injection  40 mg Subcutaneous Q24H  . insulin aspart  0-9 Units Subcutaneous TID WC  . insulin detemir  6 Units Subcutaneous QHS  . ipratropium-albuterol  3 mL Nebulization Q4H  . methylPREDNISolone (SOLU-MEDROL) injection  60 mg Intravenous 4 times per day  . montelukast  10 mg Oral QHS  . oseltamivir  75 mg Oral BID  . pantoprazole  40 mg Oral Daily   Continuous Infusions:   Principal Problem:   COPD with acute exacerbation Active Problems:   Asthma   Hyperglycemia   Sick-euthyroid syndrome   Influenza with pneumonia   Multiple lung nodules on CT    Jeannetta Ellis, PA-S [facility] 07/06/2014, 10:50 AM   Addendum  I personally saw to evaluate a patient on 07/06/2014 and agree with the above findings. Patient's is a pleasant 59 year old female with a history of COPD, admitted to the medicine service on 07/02/2014 presenting with COPD exacerbation. During my evaluation today she had audible wheezes with prolonged expiratory phase on lung exam, appearing short of breath with talking. She was ambulated down the hallway and needed to stop multiple times to "catch my  breath." I am concerned about discharging her today and think we should keep her in the hospital to receive IV steroid therapy along with DuoNeb's. Will consult physical therapy, she would likely benefit from home PT as well. Reassess in a.m.

## 2014-07-07 LAB — GLUCOSE, CAPILLARY
Glucose-Capillary: 189 mg/dL — ABNORMAL HIGH (ref 70–99)
Glucose-Capillary: 210 mg/dL — ABNORMAL HIGH (ref 70–99)

## 2014-07-07 MED ORDER — IPRATROPIUM-ALBUTEROL 0.5-2.5 (3) MG/3ML IN SOLN: 3.0000 mL | RESPIRATORY_TRACT | Status: DC | PRN

## 2014-07-07 MED ORDER — OSELTAMIVIR PHOSPHATE 75 MG PO CAPS: 75.0000 mg | ORAL_CAPSULE | Freq: Two times a day (BID) | ORAL | Status: DC

## 2014-07-07 MED ORDER — PREDNISONE 20 MG PO TABS: 20.0000 mg | ORAL_TABLET | Freq: Every day | ORAL | Status: DC

## 2014-07-07 MED ORDER — OSELTAMIVIR PHOSPHATE 75 MG PO CAPS: 75.0000 mg | ORAL_CAPSULE | Freq: Two times a day (BID) | ORAL | Status: AC

## 2014-07-07 NOTE — Evaluation (Signed)
Physical Therapy One Time Evaluation Patient Details Name: Sonia Drake MRN: [id number] DOB: 1955-05-29 Today's Date: 07/07/2014   History of Present Illness  59 yo female with a history of COPD and asthma admitted on 3/12 for acute exacerbation of COPD/asthma  Clinical Impression  Patient evaluated by Physical Therapy with no further acute PT needs identified. All education has been completed and the patient has no further questions. Pt reports DOE is her baseline and states her daughter assists her with all activities.  Pt requires multiple rest breaks throughout tasks.  Pt very pleased with her ambulation distance and dyspnea today stating best it has been in a long time.  She is agreeable to HHPT and plans to d/c home today.  PT is signing off. Thank you for this referral.     Follow Up Recommendations Home health PT    Equipment Recommendations  None recommended by PT    Recommendations for Other Services       Precautions / Restrictions Precautions Precautions: Fall      Mobility  Bed Mobility               General bed mobility comments: pt sitting EOB on arrival  Transfers Overall transfer level: Needs assistance Equipment used: None Transfers: Sit to/from Stand Sit to Stand: Min guard            Ambulation/Gait Ambulation/Gait assistance: Min guard Ambulation Distance (Feet): 100 Feet Assistive device: 1 person hand held assist Gait Pattern/deviations: Step-through pattern Gait velocity: decr   General Gait Details: very slow pace, a few short standing rest breaks for breathing, 3/4 dyspnea however pt reports better today compared to yesterday, SpO2 92-95% room air during ambulation, HR up to 133  Stairs            Wheelchair Mobility    Modified Rankin (Stroke Patients Only)       Balance                                             Pertinent Vitals/Pain Pain Assessment: No/denies pain    Home Living  Family/patient expects to be discharged to:: Private residence Living Arrangements: Children Available Help at Discharge: Family         Home Layout: One level Home Equipment:  (rollator)      Prior Function Level of Independence: Needs assistance   Gait / Transfers Assistance Needed: short distance ambulation and many rest breaks due to DOE  ADL's / Homemaking Assistance Needed: daughter assists due to DOE        [employer]        Extremity/Trunk Assessment               Lower Extremity Assessment: Generalized weakness         Communication   Communication: No difficulties  Cognition Arousal/Alertness: Awake/alert Behavior During Therapy: WFL for tasks assessed/performed Overall Cognitive Status: Within Functional Limits for tasks assessed                      General Comments      Exercises        Assessment/Plan    PT Assessment All further PT needs can be met in the next venue of care  PT Diagnosis Difficulty walking   PT Problem List Cardiopulmonary status limiting activity;Decreased mobility;Decreased activity tolerance  PT  Treatment Interventions     PT Goals (Current goals can be found in the Care Plan section) Acute Rehab PT Goals PT Goal Formulation: All assessment and education complete, DC therapy    Frequency     Barriers to discharge        Co-evaluation               End of Session   Activity Tolerance: Other (comment) (limited by DOE) Patient left: in bed;with call bell/phone within reach           Time: 0902-0922 PT Time Calculation (min) (ACUTE ONLY): 20 min   Charges:   PT Evaluation $Initial PT Evaluation Tier I: 1 Procedure     PT G Codes:        Curtis Uriarte,KATHrine E 07/07/2014, 11:39 AM Carmelia Bake, PT, DPT 07/07/2014 Pager: [phone]

## 2014-07-07 NOTE — Care Management Note (Signed)
CARE MANAGEMENT NOTE 07/07/2014  Patient:  Sonia Drake,Sonia Drake   Account Number:  [id number]  Date Initiated:  07/04/2014  Documentation initiated by:  Sandford CrazeLEMENTS,Francesca Strome  Subjective/Objective Assessment:   59 yo admitted with ASTHMA and COPD     Action/Plan:   From home with children   Anticipated DC Date:  07/07/2014   Anticipated DC Plan:  HOME W HOME HEALTH SERVICES      DC Planning Services  CM consult      Choice offered to / List presented to:  C-1 Patient        HH arranged  HH[facility]1 RN      [facility]   Status of service:  In process, will continue to follow Medicare Important Message given?   (If response is "NO", the following Medicare IM given date fields will be blank) Date Medicare IM given:   Medicare IM given by:   Date Additional Medicare IM given:   Additional Medicare IM given by:    Discharge Disposition:    Per UR Regulation:  Reviewed for med. necessity/level of care/duration of stay  If discussed at Long Length of Stay Meetings, dates discussed:    Comments:  07/07/14 Sandford Crazeora Dorrene Bently RN,BSN,NCM [phone] Spoke with pt at bedside about DC plans.  [facility] and it is through her Medicaid. PT is recommending HHPT.  Because pt has medicaid she will only be able to receive a HHRN for safety eval. Pt offered choice and chose [facility].  [facility]  HH orders written by MD. No other DC needs noted.  07/04/14 Sandford Crazeora Namrata Dangler RN,BSN,NCM [phone] Chart reviewed and CM following for DC needs.

## 2014-07-07 NOTE — Discharge Instructions (Signed)

## 2014-07-27 ENCOUNTER — Telehealth: Payer: Self-pay | Admitting: Internal Medicine

## 2014-07-27 NOTE — Telephone Encounter (Signed)
Spoke with [employer] Drake's office (nurse Sonia Drake) - Sonia Reiningicole will speak with Sonia Drake and will cal Sonia Drake back with Sonia Drake's reponse.  Spoke with pt and patient is aware that we are working to get her set up with a physician that can offer long term f/u care.  Pt aware that we will call her back as soon as we hear from Dr Sharrell KuBoyd's office.

## 2014-07-27 NOTE — Telephone Encounter (Signed)
[facility] and you can let Virl Sonammy Boyd know I will make an exception and work her in at her request x one consultation type visit  but since she's been discharged once it is really better to ask [facility] to see if possibleand do longterm f/u

## 2014-07-27 NOTE — Telephone Encounter (Signed)
Pt calling again about appointment, please advise.Sonia GriffinsStanley A Drake

## 2014-07-27 NOTE — Telephone Encounter (Signed)
LMOM for Tammy Boyd's CMA, Joni Reiningicole  Number there is [phone]

## 2014-07-27 NOTE — Telephone Encounter (Signed)
Dr Sherene SiresWert, do you want want to see this pt again? She was d/c'ed in 2007 for not keeping her appts with you

## 2014-08-05 NOTE — Telephone Encounter (Signed)
Spoke with Sonia Drake at Dr. Sharrell KuBoyd's office, states that pt has been set up with another practice.  Nothing further needed.

## 2015-01-08 ENCOUNTER — Emergency Department (HOSPITAL_COMMUNITY)
Admission: EM | Admit: 2015-01-08 | Discharge: 2015-01-08 | Disposition: A | Discharge status: Home / Self Care | Payer: Medicaid Other | Attending: Emergency Medicine | Admitting: Emergency Medicine

## 2015-01-08 ENCOUNTER — Emergency Department (HOSPITAL_COMMUNITY): Payer: Medicaid Other

## 2015-01-08 ENCOUNTER — Encounter (HOSPITAL_COMMUNITY): Payer: Self-pay | Admitting: Emergency Medicine

## 2015-01-08 DIAGNOSIS — Z79899 Other long term (current) drug therapy: Secondary | ICD-10-CM | POA: Insufficient documentation

## 2015-01-08 DIAGNOSIS — Z7951 Long term (current) use of inhaled steroids: Secondary | ICD-10-CM | POA: Insufficient documentation

## 2015-01-08 DIAGNOSIS — J45909 Unspecified asthma, uncomplicated: Secondary | ICD-10-CM

## 2015-01-08 DIAGNOSIS — J441 Chronic obstructive pulmonary disease with (acute) exacerbation: Secondary | ICD-10-CM | POA: Diagnosis not present

## 2015-01-08 MED ORDER — PREDNISONE 20 MG PO TABS: 40.0000 mg | ORAL_TABLET | Freq: Every day | ORAL | Status: DC

## 2015-01-08 MED ORDER — PREDNISONE 20 MG PO TABS
40.0000 mg | ORAL_TABLET | Freq: Once | ORAL | Status: AC
  Administered 2015-01-08: 40 mg via ORAL
  Filled 2015-01-08: qty 2

## 2015-01-08 MED ORDER — ALBUTEROL SULFATE (2.5 MG/3ML) 0.083% IN NEBU
5.0000 mg | INHALATION_SOLUTION | Freq: Once | RESPIRATORY_TRACT | Status: AC
  Administered 2015-01-08: 5 mg via RESPIRATORY_TRACT
  Filled 2015-01-08 (×2): qty 6

## 2015-01-08 MED ORDER — IPRATROPIUM BROMIDE 0.02 % IN SOLN
0.5000 mg | Freq: Once | RESPIRATORY_TRACT | Status: AC
  Administered 2015-01-08: 0.5 mg via RESPIRATORY_TRACT
  Filled 2015-01-08: qty 2.5

## 2015-01-08 MED ORDER — ALBUTEROL SULFATE (2.5 MG/3ML) 0.083% IN NEBU
5.0000 mg | INHALATION_SOLUTION | Freq: Once | RESPIRATORY_TRACT | Status: AC
  Administered 2015-01-08: 5 mg via RESPIRATORY_TRACT
  Filled 2015-01-08: qty 6

## 2015-01-08 NOTE — ED Notes (Signed)
Pt from home c/o shortness of breath. Audible wheezing. She has used inhalers at home without relief.

## 2015-01-08 NOTE — Discharge Instructions (Signed)
Take prednisone as directed until gone. Refer to attached documents for more information. Return to the ED with worsening or concerning symptoms.  °

## 2015-01-08 NOTE — ED Provider Notes (Signed)
CSN: [id number]     Arrival date & time 01/08/15  0246 History   First MD Initiated Contact with Patient 01/08/15 0300     Chief Complaint  Patient presents with  . Asthma     (Consider location/radiation/quality/duration/timing/severity/associated sxs/prior Treatment) Patient is a 59 y.o. female presenting with wheezing. The history is provided by the patient. No language interpreter was used.  Wheezing Severity:  Severe Severity compared to prior episodes:  Similar Onset quality:  Sudden Duration:  2 hours Timing:  Constant Progression:  Worsening Chronicity:  Recurrent Context: not animal exposure, not emotional upset, not exercise, not exposure to allergen, not fumes, not pet dander, not pollens, not smoke exposure and not tartrazine   Relieved by:  Nothing Worsened by:  Nothing tried Ineffective treatments:  Nebulizer treatments and beta-agonist inhaler Associated symptoms: chest tightness   Associated symptoms: no chest pain, no cough, no ear pain, no fatigue, no foot swelling, no headaches, no orthopnea, no PND, no rhinorrhea, no shortness of breath and no stridor   Risk factors: not exposed to toxic fumes, no prior hospitalizations, no prior ICU admissions, no prior intubations, no smoke inhalation and no suspected foreign body     Past Medical History  Diagnosis Date  . COPD (chronic obstructive pulmonary disease)   . Asthma    History reviewed. No pertinent past surgical history. Family History  Problem Relation Age of Onset  . Heart disease Mother   . Heart disease Father   . Diabetes Father   . Asthma Father   . Diabetes Brother   . Diabetes Sister   . Asthma Brother   . Asthma Sister   . Hypertension Sister    Social History  Substance Use Topics  . Smoking status: Never Smoker   . Smokeless tobacco: None  . Alcohol Use: None   OB History    No data available     Review of Systems  Constitutional: Negative for fatigue.  HENT: Negative for ear  pain and rhinorrhea.   Respiratory: Positive for chest tightness and wheezing. Negative for cough, shortness of breath and stridor.   Cardiovascular: Negative for chest pain, orthopnea and PND.  Neurological: Negative for headaches.  All other systems reviewed and are negative.     Allergies  Contrast media; Peanuts; Shellfish allergy; Soap; and Eggs or egg-derived products  Home Medications   Prior to Admission medications   Medication Sig Start Date End Date Taking? Authorizing Provider  albuterol (PROVENTIL HFA;VENTOLIN HFA) 108 (90 BASE) MCG/ACT inhaler Inhale into the lungs every 6 (six) hours as needed for wheezing or shortness of breath.    Historical Provider, MD  AZELASTINE HCL NA Place 1 spray into the nose 2 (two) times daily.    Historical Provider, MD  budesonide-formoterol (SYMBICORT) 160-4.5 MCG/ACT inhaler Inhale 2 puffs into the lungs 2 (two) times daily.    Historical Provider, MD  calcium-vitamin D (OSCAL WITH D) 500-200 MG-UNIT per tablet Take 1 tablet by mouth daily.    Historical Provider, MD  cetirizine (ZYRTEC) 10 MG tablet Take 10 mg by mouth every evening.     Historical Provider, MD  cholecalciferol (VITAMIN D) 1000 UNITS tablet Take 1,000 Units by mouth daily.    Historical Provider, MD  dextromethorphan-guaiFENesin (MUCINEX DM) 30-600 MG per 12 hr tablet Take 1 tablet by mouth 2 (two) times daily. Use for 4 days then as needed. 06/30/14   Rodolph Bong, MD  ipratropium-albuterol (DUONEB) 0.5-2.5 (3) MG/3ML SOLN Take 3 mLs  by nebulization every 6 (six) hours as needed. Use 3 times daily x 4 days then as needed. 06/30/14   Rodolph Bong, MD  ipratropium-albuterol (DUONEB) 0.5-2.5 (3) MG/3ML SOLN Take 3 mLs by nebulization every 4 (four) hours as needed. 07/07/14   Jeralyn Bennett, MD  montelukast (SINGULAIR) 10 MG tablet Take 10 mg by mouth at bedtime.    Historical Provider, MD  pantoprazole (PROTONIX) 20 MG tablet Take 20 mg by mouth daily as needed for  indigestion.    Historical Provider, MD  predniSONE (DELTASONE) 20 MG tablet Take 1-3 tablets (20-60 mg total) by mouth daily with breakfast. Take 3 tablets ( ) daily x 3 days, then 2 tablets ( ) daily x 3 days, then 1 tablet ( ) daily x 3 days then stop. 07/07/14   Jeralyn Bennett, MD  tiotropium (SPIRIVA) 18 MCG inhalation capsule Place 18 mcg into inhaler and inhale daily.    Historical Provider, MD   BP 117/55 mmHg  Pulse 95  Resp 26  SpO2 94% Physical Exam  Constitutional: She is oriented to person, place, and time. She appears well-developed and well-nourished. No distress.  HENT:  Head: Normocephalic and atraumatic.  Eyes: Conjunctivae and EOM are normal.  Neck: Normal range of motion.  Cardiovascular: Normal rate and regular rhythm.  Exam reveals no gallop and no friction rub.   No murmur heard. Pulmonary/Chest: She has wheezes. She has no rales. She exhibits no tenderness.  Increased breathing effort. Expiratory wheezing in bilateral lungs.   Abdominal: Soft. She exhibits no distension. There is no tenderness. There is no rebound.  Musculoskeletal: Normal range of motion.  Neurological: She is alert and oriented to person, place, and time. Coordination normal.  Speech is goal-oriented. Moves limbs without ataxia.   Skin: Skin is warm and dry.  Psychiatric: She has a normal mood and affect. Her behavior is normal.  Nursing note and vitals reviewed.   ED Course  Procedures (including critical care time) Labs Review Labs Reviewed - No data to display  Imaging Review Dg Chest 2 View  01/08/2015   CLINICAL DATA:  Shortness of breath, wheezing. History of COPD and asthma.  EXAM: CHEST  2 VIEW  COMPARISON:  Chest radiograph October 08, 2014  FINDINGS: Cardiomediastinal silhouette is normal. Increased lung volumes. The lungs are clear without pleural effusions or focal consolidations. Trachea projects midline and there is no pneumothorax. Soft tissue planes and included  osseous structures are non-suspicious. Mild upper thoracic dextroscoliosis.  IMPRESSION: Hyperinflation without acute cardiopulmonary process.   Electronically Signed   By: Awilda Metro M.D.   On: 01/08/2015 04:14   I have personally reviewed and evaluated these images and lab results as part of my medical decision-making.   EKG Interpretation None      MDM   Final diagnoses:  Asthma, unspecified asthma severity, uncomplicated    3:56 AM Chest xray pending. Patient receiving albuterol nebulizer at this time. Vitals stable and patient afebrile.   Chest xray unremarkable for acute changes. Patient feeling better and will be discharged.    Emilia Beck, PA-C 01/09/15 1610  Geoffery Lyons, MD 01/10/15 2130

## 2015-02-13 ENCOUNTER — Other Ambulatory Visit: Payer: Self-pay | Admitting: Family Medicine

## 2015-02-13 DIAGNOSIS — Z1231 Encounter for screening mammogram for malignant neoplasm of breast: Secondary | ICD-10-CM

## 2015-02-15 ENCOUNTER — Other Ambulatory Visit: Payer: Self-pay

## 2015-02-15 DIAGNOSIS — Z1231 Encounter for screening mammogram for malignant neoplasm of breast: Secondary | ICD-10-CM

## 2015-03-01 ENCOUNTER — Ambulatory Visit: Payer: Medicaid Other

## 2015-04-13 ENCOUNTER — Encounter (HOSPITAL_COMMUNITY): Payer: Self-pay | Admitting: Emergency Medicine

## 2015-04-13 ENCOUNTER — Emergency Department (HOSPITAL_COMMUNITY): Payer: Medicaid Other

## 2015-04-13 ENCOUNTER — Emergency Department (HOSPITAL_COMMUNITY)
Admission: EM | Admit: 2015-04-13 | Discharge: 2015-04-14 | Disposition: A | Discharge status: Home / Self Care | Payer: Medicaid Other | Attending: Emergency Medicine | Admitting: Emergency Medicine

## 2015-04-13 DIAGNOSIS — Z79899 Other long term (current) drug therapy: Secondary | ICD-10-CM | POA: Insufficient documentation

## 2015-04-13 DIAGNOSIS — J449 Chronic obstructive pulmonary disease, unspecified: Secondary | ICD-10-CM | POA: Diagnosis not present

## 2015-04-13 DIAGNOSIS — R079 Chest pain, unspecified: Secondary | ICD-10-CM | POA: Diagnosis present

## 2015-04-13 DIAGNOSIS — R110 Nausea: Secondary | ICD-10-CM | POA: Insufficient documentation

## 2015-04-13 DIAGNOSIS — R61 Generalized hyperhidrosis: Secondary | ICD-10-CM | POA: Diagnosis not present

## 2015-04-13 DIAGNOSIS — R0789 Other chest pain: Secondary | ICD-10-CM | POA: Diagnosis not present

## 2015-04-13 LAB — CBC
HCT: 42 % (ref 36.0–46.0)
Hemoglobin: 13.2 g/dL (ref 12.0–15.0)
MCH: 29 pg (ref 26.0–34.0)
MCHC: 31.4 g/dL (ref 30.0–36.0)
MCV: 92.3 fL (ref 78.0–100.0)
Platelets: 248 10*3/uL (ref 150–400)
RBC: 4.55 MIL/uL (ref 3.87–5.11)
RDW: 14.5 % (ref 11.5–15.5)
WBC: 7 10*3/uL (ref 4.0–10.5)

## 2015-04-13 LAB — I-STAT TROPONIN, ED: Troponin i, poc: 0 ng/mL (ref 0.00–0.08)

## 2015-04-13 LAB — BASIC METABOLIC PANEL
Anion gap: 8 (ref 5–15)
BUN: 11 mg/dL (ref 6–20)
CO2: 27 mmol/L (ref 22–32)
Calcium: 10 mg/dL (ref 8.9–10.3)
Chloride: 108 mmol/L (ref 101–111)
Creatinine, Ser: 0.85 mg/dL (ref 0.44–1.00)
GFR calc Af Amer: >60 mL/min (ref >60)
GFR calc non Af Amer: >60 mL/min (ref >60)
Glucose, Bld: 95 mg/dL (ref 65–99)
Potassium: 4.2 mmol/L (ref 3.5–5.1)
Sodium: 143 mmol/L (ref 135–145)

## 2015-04-13 MED ORDER — IBUPROFEN 800 MG PO TABS
800.0000 mg | ORAL_TABLET | Freq: Once | ORAL | Status: AC
  Administered 2015-04-14: 800 mg via ORAL
  Filled 2015-04-13: qty 1

## 2015-04-13 MED ORDER — NAPROXEN 500 MG PO TABS: 500.0000 mg | ORAL_TABLET | Freq: Two times a day (BID) | ORAL | Status: DC

## 2015-04-13 MED ORDER — OXYCODONE-ACETAMINOPHEN 5-325 MG PO TABS
1.0000 | ORAL_TABLET | Freq: Once | ORAL | Status: AC
  Administered 2015-04-14: 1 via ORAL
  Filled 2015-04-13: qty 1

## 2015-04-13 MED ORDER — OXYCODONE-ACETAMINOPHEN 5-325 MG PO TABS: 1.0000 | ORAL_TABLET | ORAL | Status: DC | PRN

## 2015-04-13 NOTE — ED Provider Notes (Signed)
CSN: [id number]     Arrival date & time 04/13/15  2114 History  By signing my name below, I, Sonia Drake, attest that this documentation has been prepared under the direction and in the presence of [name], MD. Electronically Signed: Soijett Drake, ED Scribe. 04/13/2015. 11:53 PM.   Chief Complaint  Patient presents with  . Chest Pain      The history is provided by the patient. No language interpreter was used.    HPI Comments: Sonia Drake is a 59 y.o. female with a medical hx of COPD and asthma who presents to the Emergency Department complaining of 10/10, intermittent, left inframammary CP x 2 days ago worsening yesterday. She reports that the CP feels like a catch in her chest. She describes the CP as tightness and that breathing worsens her CP. She denies any activities that are out of the norm for her. She states that she is having associated symptoms of nausea and diaphoresis. She states that she has not tried any medications for the relief for her symptoms. She denies vomiting, cough, fever, and any other symptoms. Denies allergies to medications but the pt is allergic to contrast media.    PCP: Dr. Verlon Auammy Lamonica Boyd   Past Medical History  Diagnosis Date  . COPD (chronic obstructive pulmonary disease) (HCC)   . Asthma    History reviewed. No pertinent past surgical history. Family History  Problem Relation Age of Onset  . Heart disease Mother   . Heart disease Father   . Diabetes Father   . Asthma Father   . Diabetes Brother   . Diabetes Sister   . Asthma Brother   . Asthma Sister   . Hypertension Sister    Social History  Substance Use Topics  . Smoking status: Never Smoker   . Smokeless tobacco: None  . Alcohol Use: No   OB History    No data available     Review of Systems  Constitutional: Positive for diaphoresis. Negative for fever.  Respiratory: Negative for cough.   Cardiovascular: Positive for chest pain.  Gastrointestinal: Positive for  nausea. Negative for vomiting.  All other systems reviewed and are negative.     Allergies  Contrast media; Peanuts; Shellfish allergy; Soap; and Eggs or egg-derived products  Home Medications   Prior to Admission medications   Medication Sig Start Date End Date Taking? Authorizing Provider  albuterol (PROVENTIL HFA;VENTOLIN HFA) 108 (90 BASE) MCG/ACT inhaler Inhale into the lungs every 6 (six) hours as needed for wheezing or shortness of breath.   Yes Historical Provider, MD  AZELASTINE HCL NA Place 1 spray into the nose 2 (two) times daily.   Yes Historical Provider, MD  budesonide-formoterol (SYMBICORT) 160-4.5 MCG/ACT inhaler Inhale 2 puffs into the lungs 2 (two) times daily.   Yes Historical Provider, MD  calcium-vitamin D (OSCAL WITH D) 500-200 MG-UNIT per tablet Take 1 tablet by mouth daily.   Yes Historical Provider, MD  cetirizine (ZYRTEC) 10 MG tablet Take 10 mg by mouth every evening.    Yes Historical Provider, MD  cholecalciferol (VITAMIN D) 1000 UNITS tablet Take 1,000 Units by mouth daily.   Yes Historical Provider, MD  ipratropium-albuterol (DUONEB) 0.5-2.5 (3) MG/3ML SOLN Take 3 mLs by nebulization every 4 (four) hours as needed. 07/07/14  Yes Jeralyn BennettEzequiel Zamora, MD  montelukast (SINGULAIR) 10 MG tablet Take 10 mg by mouth at bedtime.   Yes Historical Provider, MD  pantoprazole (PROTONIX) 20 MG tablet Take 20 mg by  mouth daily as needed for indigestion.   Yes Historical Provider, MD  theophylline (THEODUR) 300 MG 12 hr tablet Take 300 mg by mouth 2 (two) times daily.   Yes Historical Provider, MD  tiotropium (SPIRIVA) 18 MCG inhalation capsule Place 18 mcg into inhaler and inhale daily.   Yes Historical Provider, MD  triamcinolone lotion (KENALOG) 0.1 % APPLY TOPICALLY ONCE DAILY AS NEEDED FOR DRY SKIN 02/27/15  Yes Historical Provider, MD  dextromethorphan-guaiFENesin (MUCINEX DM) 30-600 MG per 12 hr tablet Take 1 tablet by mouth 2 (two) times daily. Use for 4 days then as  needed. Patient not taking: Reported on 01/08/2015 06/30/14   Rodolph Bong, MD  ipratropium-albuterol (DUONEB) 0.5-2.5 (3) MG/3ML SOLN Take 3 mLs by nebulization every 6 (six) hours as needed. Use 3 times daily x 4 days then as needed. Patient not taking: Reported on 01/08/2015 06/30/14   Rodolph Bong, MD  predniSONE (DELTASONE) 20 MG tablet Take 2 tablets (40 mg total) by mouth daily. Take 40 mg by mouth daily for 3 days, then  by mouth daily for 3 days, then  daily for 3 days Patient not taking: Reported on 04/13/2015 01/08/15   Kaitlyn Szekalski, PA-C   BP 145/64 mmHg  Pulse 94  Temp(Src) 98.1 F (36.7 C) (Oral)  Resp 18  SpO2 98% Physical Exam  Constitutional: She is oriented to person, place, and time. She appears well-developed and well-nourished. No distress.  HENT:  Head: Normocephalic and atraumatic.  Eyes: EOM are normal. Pupils are equal, round, and reactive to light.  Neck: Normal range of motion. Neck supple. No JVD present.  Cardiovascular: Normal rate, regular rhythm and normal heart sounds.  Exam reveals no gallop and no friction rub.   No murmur heard. Pulmonary/Chest: Effort normal and breath sounds normal. She has no wheezes. She has no rales. She exhibits tenderness.  Tender over left anterior lower ribcage which reproduces her pain.   Abdominal: Soft. Bowel sounds are normal. She exhibits no distension and no mass. There is no tenderness.  Musculoskeletal: Normal range of motion. She exhibits no edema.  Lymphadenopathy:    She has no cervical adenopathy.  Neurological: She is alert and oriented to person, place, and time. No cranial nerve deficit. She exhibits normal muscle tone. Coordination normal.  Skin: Skin is warm and dry. No rash noted.  Psychiatric: She has a normal mood and affect. Her behavior is normal. Judgment and thought content normal.  Nursing note and vitals reviewed.   ED Course  Procedures (including critical care  time) DIAGNOSTIC STUDIES: Oxygen Saturation is 98% on RA, nl by my interpretation.    COORDINATION OF CARE: 11:50 PM Discussed treatment plan with pt at bedside which includes labs, CXR, EKG, ibuprofen in ED, naprosyn Rx, percocet Rx PRN, and f/u with PCP next week, and pt agreed to plan.    Labs Review Results for orders placed or performed during the hospital encounter of 04/13/15  Basic metabolic panel  Result Value Ref Range   Sodium 143 135 - 145 mmol/L   Potassium 4.2 3.5 - 5.1 mmol/L   Chloride 108 101 - 111 mmol/L   CO2 27 22 - 32 mmol/L   Glucose, Bld 95 65 - 99 mg/dL   BUN 11 6 - 20 mg/dL   Creatinine, Ser 1.19 0.44 - 1.00 mg/dL   Calcium 14.7 8.9 - 82.9 mg/dL   GFR calc non Af Amer >60 >60 mL/min   GFR calc Af Amer >60 >60  mL/min   Anion gap 8 5 - 15  CBC  Result Value Ref Range   WBC 7.0 4.0 - 10.5 K/uL   RBC 4.55 3.87 - 5.11 MIL/uL   Hemoglobin 13.2 12.0 - 15.0 g/dL   HCT 16.1 09.6 - 04.5 %   MCV 92.3 78.0 - 100.0 fL   MCH 29.0 26.0 - 34.0 pg   MCHC 31.4 30.0 - 36.0 g/dL   RDW 40.9 81.1 - 91.4 %   Platelets 248 150 - 400 K/uL  I-stat troponin, ED (not at [facility], [facility])  Result Value Ref Range   Troponin i, poc 0.00 0.00 - 0.08 ng/mL   Comment 3           Imaging Review Dg Chest 2 View  04/13/2015  CLINICAL DATA:  Left-sided chest pain EXAM: CHEST  2 VIEW COMPARISON:  02/02/2015 FINDINGS: Chronic hyperinflation. There is no edema, consolidation, effusion, or pneumothorax. Normal heart size and aortic contours. IMPRESSION: COPD without acute superimposed finding. Electronically Signed   By: Marnee Spring M.D.   On: 04/13/2015 21:48   I have personally reviewed and evaluated these images and lab results as part of my medical decision-making.   EKG Interpretation   Date/Time:  Thursday April 13 2015 21:20:26 EST Ventricular Rate:  96 PR Interval:  132 QRS Duration: 96 QT Interval:  320 QTC Calculation: 404 R Axis:   85 Text Interpretation:  Sinus  rhythm Ventricular premature complex Consider  right ventricular hypertrophy Nonspecific T abnormalities, inferior leads  since last tracing no significant change Confirmed by Effie Shy  MD, ELLIOTT  ([id number]) on 04/13/2015 9:54:03 PM      MDM   Final diagnoses:  Chest wall pain    Chest pain which is clearly musculoskeletal. Old records were reviewed,  And she has prior ED visits for COPD/asthma. She is discharged with prescriptions for naproxen and oxycodone-acetaminophen.  I personally performed the services described in this documentation, which was scribed in my presence. The recorded information has been reviewed and is accurate.      Dione Booze, MD 04/14/15 2258

## 2015-04-13 NOTE — Discharge Instructions (Signed)
Return if you start running a fever, having worsening difficulty breathing, or if pain is not being adequately controlled.  Chest Wall Pain Chest wall pain is pain in or around the bones and muscles of your chest. Sometimes, an injury causes this pain. Sometimes, the cause may not be known. This pain may take several weeks or longer to get better. HOME CARE INSTRUCTIONS  Pay attention to any changes in your symptoms. Take these actions to help with your pain:   Rest as told by your health care provider.   Avoid activities that cause pain. These include any activities that use your chest muscles or your abdominal and side muscles to lift heavy items.   If directed, apply ice to the painful area:  Put ice in a plastic bag.  Place a towel between your skin and the bag.  Leave the ice on for 20 minutes, 2-3 times per day.  Take over-the-counter and prescription medicines only as told by your health care provider.  Do not use tobacco products, including cigarettes, chewing tobacco, and e-cigarettes. If you need help quitting, ask your health care provider.  Keep all follow-up visits as told by your health care provider. This is important. SEEK MEDICAL CARE IF:  You have a fever.  Your chest pain becomes worse.  You have new symptoms. SEEK IMMEDIATE MEDICAL CARE IF:  You have nausea or vomiting.  You feel sweaty or light-headed.  You have a cough with phlegm (sputum) or you cough up blood.  You develop shortness of breath.   This information is not intended to replace advice given to you by your health care provider. Make sure you discuss any questions you have with your health care provider.   Document Released: 04/08/2005 Document Revised: 12/28/2014 Document Reviewed: 07/04/2014 Elsevier Interactive Patient Education 2016 Elsevier Inc.  Naproxen and naproxen sodium oral immediate-release tablets What is this medicine? NAPROXEN (na PROX en) is a non-steroidal  anti-inflammatory drug (NSAID). It is used to reduce swelling and to treat pain. This medicine may be used for dental pain, headache, or painful monthly periods. It is also used for painful joint and muscular problems such as arthritis, tendinitis, bursitis, and gout. This medicine may be used for other purposes; ask your health care provider or pharmacist if you have questions. What should I tell my health care provider before I take this medicine? They need to know if you have any of these conditions: -asthma -cigarette smoker -drink more than 3 alcohol containing drinks a day -heart disease or circulation problems such as heart failure or leg edema (fluid retention) -high blood pressure -kidney disease -liver disease -stomach bleeding or ulcers -an unusual or allergic reaction to naproxen, aspirin, other NSAIDs, other medicines, foods, dyes, or preservatives -pregnant or trying to get pregnant -breast-feeding How should I use this medicine? Take this medicine by mouth with a glass of water. Follow the directions on the prescription label. Take it with food if your stomach gets upset. Try to not lie down for at least 10 minutes after you take it. Take your medicine at regular intervals. Do not take your medicine more often than directed. Long-term, continuous use may increase the risk of heart attack or stroke. A special MedGuide will be given to you by the pharmacist with each prescription and refill. Be sure to read this information carefully each time. Talk to your pediatrician regarding the use of this medicine in children. Special care may be needed. Overdosage: If you think you have  taken too much of this medicine contact a poison control center or emergency room at once. NOTE: This medicine is only for you. Do not share this medicine with others. What if I miss a dose? If you miss a dose, take it as soon as you can. If it is almost time for your next dose, take only that dose. Do not  take double or extra doses. What may interact with this medicine? -alcohol -aspirin -cidofovir -diuretics -lithium -methotrexate -other drugs for inflammation like ketorolac or prednisone -pemetrexed -probenecid -warfarin This list may not describe all possible interactions. Give your health care provider a list of all the medicines, herbs, non-prescription drugs, or dietary supplements you use. Also tell them if you smoke, drink alcohol, or use illegal drugs. Some items may interact with your medicine. What should I watch for while using this medicine? Tell your doctor or health care professional if your pain does not get better. Talk to your doctor before taking another medicine for pain. Do not treat yourself. This medicine does not prevent heart attack or stroke. In fact, this medicine may increase the chance of a heart attack or stroke. The chance may increase with longer use of this medicine and in people who have heart disease. If you take aspirin to prevent heart attack or stroke, talk with your doctor or health care professional. Do not take other medicines that contain aspirin, ibuprofen, or naproxen with this medicine. Side effects such as stomach upset, nausea, or ulcers may be more likely to occur. Many medicines available without a prescription should not be taken with this medicine. This medicine can cause ulcers and bleeding in the stomach and intestines at any time during treatment. Do not smoke cigarettes or drink alcohol. These increase irritation to your stomach and can make it more susceptible to damage from this medicine. Ulcers and bleeding can happen without warning symptoms and can cause death. You may get drowsy or dizzy. Do not drive, use machinery, or do anything that needs mental alertness until you know how this medicine affects you. Do not stand or sit up quickly, especially if you are an older patient. This reduces the risk of dizzy or fainting spells. This medicine  can cause you to bleed more easily. Try to avoid damage to your teeth and gums when you brush or floss your teeth. What side effects may I notice from receiving this medicine? Side effects that you should report to your doctor or health care professional as soon as possible: -black or bloody stools, blood in the urine or vomit -blurred vision -chest pain -difficulty breathing or wheezing -nausea or vomiting -severe stomach pain -skin rash, skin redness, blistering or peeling skin, hives, or itching -slurred speech or weakness on one side of the body -swelling of eyelids, throat, lips -unexplained weight gain or swelling -unusually weak or tired -yellowing of eyes or skin Side effects that usually do not require medical attention (report to your doctor or health care professional if they continue or are bothersome): -constipation -headache -heartburn This list may not describe all possible side effects. Call your doctor for medical advice about side effects. You may report side effects to FDA at 1-800-FDA-1088. Where should I keep my medicine? Keep out of the reach of children. Store at room temperature between 15 and 30 degrees C (59 and 86 degrees F). Keep container tightly closed. Throw away any unused medicine after the expiration date. NOTE: This sheet is a summary. It may not cover all possible  information. If you have questions about this medicine, talk to your doctor, pharmacist, or health care provider.    2016, Elsevier/Gold Standard. (2009-04-10 20:10:16)  Acetaminophen; Oxycodone tablets What is this medicine? ACETAMINOPHEN; OXYCODONE (a set a MEE noe fen; ox i KOE done) is a pain reliever. It is used to treat moderate to severe pain. This medicine may be used for other purposes; ask your health care provider or pharmacist if you have questions. What should I tell my health care provider before I take this medicine? They need to know if you have any of these  conditions: -brain tumor -Crohn's disease, inflammatory bowel disease, or ulcerative colitis -drug abuse or addiction -head injury -heart or circulation problems -if you often drink alcohol -kidney disease or problems going to the bathroom -liver disease -lung disease, asthma, or breathing problems -an unusual or allergic reaction to acetaminophen, oxycodone, other opioid analgesics, other medicines, foods, dyes, or preservatives -pregnant or trying to get pregnant -breast-feeding How should I use this medicine? Take this medicine by mouth with a full glass of water. Follow the directions on the prescription label. You can take it with or without food. If it upsets your stomach, take it with food. Take your medicine at regular intervals. Do not take it more often than directed. Talk to your pediatrician regarding the use of this medicine in children. Special care may be needed. Patients over 924 years old may have a stronger reaction and need a smaller dose. Overdosage: If you think you have taken too much of this medicine contact a poison control center or emergency room at once. NOTE: This medicine is only for you. Do not share this medicine with others. What if I miss a dose? If you miss a dose, take it as soon as you can. If it is almost time for your next dose, take only that dose. Do not take double or extra doses. What may interact with this medicine? -alcohol -antihistamines -barbiturates like amobarbital, butalbital, butabarbital, methohexital, pentobarbital, phenobarbital, thiopental, and secobarbital -benztropine -drugs for bladder problems like solifenacin, trospium, oxybutynin, tolterodine, hyoscyamine, and methscopolamine -drugs for breathing problems like ipratropium and tiotropium -drugs for certain stomach or intestine problems like propantheline, homatropine methylbromide, glycopyrrolate, atropine, belladonna, and dicyclomine -general anesthetics like etomidate,  ketamine, nitrous oxide, propofol, desflurane, enflurane, halothane, isoflurane, and sevoflurane -medicines for depression, anxiety, or psychotic disturbances -medicines for sleep -muscle relaxants -naltrexone -narcotic medicines (opiates) for pain -phenothiazines like perphenazine, thioridazine, chlorpromazine, mesoridazine, fluphenazine, prochlorperazine, promazine, and trifluoperazine -scopolamine -tramadol -trihexyphenidyl This list may not describe all possible interactions. Give your health care provider a list of all the medicines, herbs, non-prescription drugs, or dietary supplements you use. Also tell them if you smoke, drink alcohol, or use illegal drugs. Some items may interact with your medicine. What should I watch for while using this medicine? Tell your doctor or health care professional if your pain does not go away, if it gets worse, or if you have new or a different type of pain. You may develop tolerance to the medicine. Tolerance means that you will need a higher dose of the medication for pain relief. Tolerance is normal and is expected if you take this medicine for a long time. Do not suddenly stop taking your medicine because you may develop a severe reaction. Your body becomes used to the medicine. This does NOT mean you are addicted. Addiction is a behavior related to getting and using a drug for a non-medical reason. If you have pain, you  have a medical reason to take pain medicine. Your doctor will tell you how much medicine to take. If your doctor wants you to stop the medicine, the dose will be slowly lowered over time to avoid any side effects. You may get drowsy or dizzy. Do not drive, use machinery, or do anything that needs mental alertness until you know how this medicine affects you. Do not stand or sit up quickly, especially if you are an older patient. This reduces the risk of dizzy or fainting spells. Alcohol may interfere with the effect of this medicine. Avoid  alcoholic drinks. There are different types of narcotic medicines (opiates) for pain. If you take more than one type at the same time, you may have more side effects. Give your health care provider a list of all medicines you use. Your doctor will tell you how much medicine to take. Do not take more medicine than directed. Call emergency for help if you have problems breathing. The medicine will cause constipation. Try to have a bowel movement at least every 2 to 3 days. If you do not have a bowel movement for 3 days, call your doctor or health care professional. Do not take Tylenol (acetaminophen) or medicines that have acetaminophen with this medicine. Too much acetaminophen can be very dangerous. Many nonprescription medicines contain acetaminophen. Always read the labels carefully to avoid taking more acetaminophen. What side effects may I notice from receiving this medicine? Side effects that you should report to your doctor or health care professional as soon as possible: -allergic reactions like skin rash, itching or hives, swelling of the face, lips, or tongue -breathing difficulties, wheezing -confusion -light headedness or fainting spells -severe stomach pain -unusually weak or tired -yellowing of the skin or the whites of the eyes Side effects that usually do not require medical attention (report to your doctor or health care professional if they continue or are bothersome): -dizziness -drowsiness -nausea -vomiting This list may not describe all possible side effects. Call your doctor for medical advice about side effects. You may report side effects to FDA at 1-800-FDA-1088. Where should I keep my medicine? Keep out of the reach of children. This medicine can be abused. Keep your medicine in a safe place to protect it from theft. Do not share this medicine with anyone. Selling or giving away this medicine is dangerous and against the law. This medicine may cause accidental overdose  and death if it taken by other adults, children, or pets. Mix any unused medicine with a substance like cat litter or coffee grounds. Then throw the medicine away in a sealed container like a sealed bag or a coffee can with a lid. Do not use the medicine after the expiration date. Store at room temperature between 20 and 25 degrees C (68 and 77 degrees F). NOTE: This sheet is a summary. It may not cover all possible information. If you have questions about this medicine, talk to your doctor, pharmacist, or health care provider.    2016, Elsevier/Gold Standard. (2014-03-09 15:18:46)

## 2015-04-13 NOTE — ED Notes (Signed)
Pt states she is having left chest pain that started yesterday   Pt states she felt like she had a catch in her chest and then felt a lot of pressure  Pt states the pain improved and then went away  Pt states it came back today  Pt states her body heated up really bad and then the pressure started back up  Pt states she felt very nervous and felt like she was having a hard time breathing  Nausea without vomiting

## 2015-04-22 ENCOUNTER — Emergency Department (HOSPITAL_COMMUNITY): Payer: Medicaid Other

## 2015-04-22 ENCOUNTER — Inpatient Hospital Stay (HOSPITAL_COMMUNITY)
Admission: EM | Admit: 2015-04-22 | Discharge: 2015-04-24 | DRG: 194 | Disposition: A | Discharge status: Home / Self Care | Payer: Medicaid Other | Attending: Internal Medicine | Admitting: Internal Medicine

## 2015-04-22 ENCOUNTER — Encounter (HOSPITAL_COMMUNITY): Payer: Self-pay | Admitting: Emergency Medicine

## 2015-04-22 DIAGNOSIS — Z791 Long term (current) use of non-steroidal anti-inflammatories (NSAID): Secondary | ICD-10-CM

## 2015-04-22 DIAGNOSIS — Z91013 Allergy to seafood: Secondary | ICD-10-CM | POA: Diagnosis not present

## 2015-04-22 DIAGNOSIS — J101 Influenza due to other identified influenza virus with other respiratory manifestations: Principal | ICD-10-CM | POA: Diagnosis present

## 2015-04-22 DIAGNOSIS — Z7952 Long term (current) use of systemic steroids: Secondary | ICD-10-CM

## 2015-04-22 DIAGNOSIS — E876 Hypokalemia: Secondary | ICD-10-CM | POA: Diagnosis present

## 2015-04-22 DIAGNOSIS — Z833 Family history of diabetes mellitus: Secondary | ICD-10-CM | POA: Diagnosis not present

## 2015-04-22 DIAGNOSIS — Z91010 Allergy to peanuts: Secondary | ICD-10-CM

## 2015-04-22 DIAGNOSIS — Z825 Family history of asthma and other chronic lower respiratory diseases: Secondary | ICD-10-CM | POA: Diagnosis not present

## 2015-04-22 DIAGNOSIS — Z8249 Family history of ischemic heart disease and other diseases of the circulatory system: Secondary | ICD-10-CM

## 2015-04-22 DIAGNOSIS — J45901 Unspecified asthma with (acute) exacerbation: Secondary | ICD-10-CM | POA: Diagnosis present

## 2015-04-22 DIAGNOSIS — R0600 Dyspnea, unspecified: Secondary | ICD-10-CM | POA: Diagnosis not present

## 2015-04-22 DIAGNOSIS — Z79899 Other long term (current) drug therapy: Secondary | ICD-10-CM

## 2015-04-22 DIAGNOSIS — R000 Tachycardia, unspecified: Secondary | ICD-10-CM | POA: Diagnosis present

## 2015-04-22 DIAGNOSIS — Z91012 Allergy to eggs: Secondary | ICD-10-CM | POA: Diagnosis not present

## 2015-04-22 DIAGNOSIS — J441 Chronic obstructive pulmonary disease with (acute) exacerbation: Secondary | ICD-10-CM | POA: Diagnosis present

## 2015-04-22 LAB — HEPATIC FUNCTION PANEL
ALT: 24 U/L (ref 14–54)
AST: 29 U/L (ref 15–41)
Albumin: 4.2 g/dL (ref 3.5–5.0)
Alkaline Phosphatase: 84 U/L (ref 38–126)
Bilirubin, Direct: <0.1 mg/dL — ABNORMAL LOW (ref 0.1–0.5)
Total Bilirubin: 0.4 mg/dL (ref 0.3–1.2)
Total Protein: 7.6 g/dL (ref 6.5–8.1)

## 2015-04-22 LAB — CBC
HCT: 42.1 % (ref 36.0–46.0)
HCT: 39.9 % (ref 36.0–46.0)
Hemoglobin: 13 g/dL (ref 12.0–15.0)
Hemoglobin: 12.9 g/dL (ref 12.0–15.0)
MCH: 28.1 pg (ref 26.0–34.0)
MCH: 29 pg (ref 26.0–34.0)
MCHC: 30.9 g/dL (ref 30.0–36.0)
MCHC: 32.3 g/dL (ref 30.0–36.0)
MCV: 90.9 fL (ref 78.0–100.0)
MCV: 89.7 fL (ref 78.0–100.0)
Platelets: 246 10*3/uL (ref 150–400)
Platelets: 230 10*3/uL (ref 150–400)
RBC: 4.63 MIL/uL (ref 3.87–5.11)
RBC: 4.45 MIL/uL (ref 3.87–5.11)
RDW: 14.9 % (ref 11.5–15.5)
RDW: 14.8 % (ref 11.5–15.5)
WBC: 6.3 10*3/uL (ref 4.0–10.5)
WBC: 6.8 10*3/uL (ref 4.0–10.5)

## 2015-04-22 LAB — INFLUENZA PANEL BY PCR (TYPE A & B)
H1N1 flu by pcr: NOT DETECTED
Influenza A By PCR: NEGATIVE
Influenza B By PCR: POSITIVE — AB

## 2015-04-22 LAB — BASIC METABOLIC PANEL
Anion gap: 9 (ref 5–15)
BUN: 8 mg/dL (ref 6–20)
CO2: 27 mmol/L (ref 22–32)
Calcium: 10.5 mg/dL — ABNORMAL HIGH (ref 8.9–10.3)
Chloride: 104 mmol/L (ref 101–111)
Creatinine, Ser: 0.79 mg/dL (ref 0.44–1.00)
GFR calc Af Amer: >60 mL/min (ref >60)
GFR calc non Af Amer: >60 mL/min (ref >60)
Glucose, Bld: 136 mg/dL — ABNORMAL HIGH (ref 65–99)
Potassium: 3.4 mmol/L — ABNORMAL LOW (ref 3.5–5.1)
Sodium: 140 mmol/L (ref 135–145)

## 2015-04-22 LAB — GLUCOSE, CAPILLARY
Glucose-Capillary: 341 mg/dL — ABNORMAL HIGH (ref 65–99)
Glucose-Capillary: 280 mg/dL — ABNORMAL HIGH (ref 65–99)

## 2015-04-22 LAB — CREATININE, SERUM
Creatinine, Ser: 0.78 mg/dL (ref 0.44–1.00)
GFR calc Af Amer: >60 mL/min (ref >60)
GFR calc non Af Amer: >60 mL/min (ref >60)

## 2015-04-22 MED ORDER — ACETAMINOPHEN 325 MG PO TABS: 650.0000 mg | ORAL_TABLET | Freq: Four times a day (QID) | ORAL | Status: DC | PRN

## 2015-04-22 MED ORDER — NAPROXEN 500 MG PO TABS
500.0000 mg | ORAL_TABLET | Freq: Two times a day (BID) | ORAL | Status: DC
  Administered 2015-04-22 – 2015-04-24 (×5): 500 mg via ORAL
  Filled 2015-04-22 (×5): qty 1

## 2015-04-22 MED ORDER — INSULIN ASPART 100 UNIT/ML ~~LOC~~ SOLN
0.0000 [IU] | Freq: Every day | SUBCUTANEOUS | Status: DC
  Administered 2015-04-22 – 2015-04-23 (×2): 3 [IU] via SUBCUTANEOUS

## 2015-04-22 MED ORDER — ENOXAPARIN SODIUM 40 MG/0.4ML ~~LOC~~ SOLN
40.0000 mg | SUBCUTANEOUS | Status: DC
  Administered 2015-04-22: 40 mg via SUBCUTANEOUS
  Filled 2015-04-22 (×3): qty 0.4

## 2015-04-22 MED ORDER — ONDANSETRON HCL 4 MG/2ML IJ SOLN
4.0000 mg | Freq: Once | INTRAMUSCULAR | Status: AC
  Administered 2015-04-22: 4 mg via INTRAVENOUS
  Filled 2015-04-22: qty 2

## 2015-04-22 MED ORDER — ALBUTEROL SULFATE (2.5 MG/3ML) 0.083% IN NEBU
5.0000 mg | INHALATION_SOLUTION | Freq: Once | RESPIRATORY_TRACT | Status: AC
  Administered 2015-04-22: 5 mg via RESPIRATORY_TRACT
  Filled 2015-04-22: qty 6

## 2015-04-22 MED ORDER — IPRATROPIUM BROMIDE 0.02 % IN SOLN: 0.5000 mg | Freq: Four times a day (QID) | RESPIRATORY_TRACT | Status: DC

## 2015-04-22 MED ORDER — ONDANSETRON HCL 4 MG/2ML IJ SOLN: 4.0000 mg | Freq: Four times a day (QID) | INTRAMUSCULAR | Status: DC | PRN

## 2015-04-22 MED ORDER — METHYLPREDNISOLONE SODIUM SUCC 125 MG IJ SOLR
60.0000 mg | Freq: Four times a day (QID) | INTRAMUSCULAR | Status: DC
  Administered 2015-04-22 – 2015-04-24 (×8): 60 mg via INTRAVENOUS
  Filled 2015-04-22 (×9): qty 0.96

## 2015-04-22 MED ORDER — LEVALBUTEROL HCL 0.63 MG/3ML IN NEBU: 0.6300 mg | INHALATION_SOLUTION | Freq: Four times a day (QID) | RESPIRATORY_TRACT | Status: DC | PRN

## 2015-04-22 MED ORDER — VITAMIN D3 25 MCG (1000 UNIT) PO TABS
1000.0000 [IU] | ORAL_TABLET | Freq: Every day | ORAL | Status: DC
  Administered 2015-04-22 – 2015-04-24 (×3): 1000 [IU] via ORAL
  Filled 2015-04-22 (×3): qty 1

## 2015-04-22 MED ORDER — MONTELUKAST SODIUM 10 MG PO TABS
10.0000 mg | ORAL_TABLET | Freq: Every day | ORAL | Status: DC
  Filled 2015-04-22 (×2): qty 1

## 2015-04-22 MED ORDER — ONDANSETRON HCL 4 MG PO TABS: 4.0000 mg | ORAL_TABLET | Freq: Four times a day (QID) | ORAL | Status: DC | PRN

## 2015-04-22 MED ORDER — ACETAMINOPHEN 650 MG RE SUPP: 650.0000 mg | Freq: Four times a day (QID) | RECTAL | Status: DC | PRN

## 2015-04-22 MED ORDER — LORATADINE 10 MG PO TABS
10.0000 mg | ORAL_TABLET | Freq: Every day | ORAL | Status: DC
  Administered 2015-04-22 – 2015-04-24 (×3): 10 mg via ORAL
  Filled 2015-04-22 (×3): qty 1

## 2015-04-22 MED ORDER — TRIAMCINOLONE ACETONIDE 0.1 % EX LOTN
TOPICAL_LOTION | Freq: Two times a day (BID) | CUTANEOUS | Status: DC
  Administered 2015-04-22 – 2015-04-24 (×4): via TOPICAL
  Filled 2015-04-22: qty 60

## 2015-04-22 MED ORDER — INSULIN ASPART 100 UNIT/ML ~~LOC~~ SOLN
0.0000 [IU] | Freq: Three times a day (TID) | SUBCUTANEOUS | Status: DC
  Administered 2015-04-22: 15 [IU] via SUBCUTANEOUS
  Administered 2015-04-23 (×3): 4 [IU] via SUBCUTANEOUS
  Administered 2015-04-24: 7 [IU] via SUBCUTANEOUS

## 2015-04-22 MED ORDER — AZITHROMYCIN 500 MG PO TABS
500.0000 mg | ORAL_TABLET | Freq: Every day | ORAL | Status: AC
  Administered 2015-04-22: 500 mg via ORAL
  Filled 2015-04-22: qty 1

## 2015-04-22 MED ORDER — POLYETHYLENE GLYCOL 3350 17 G PO PACK: 17.0000 g | PACK | Freq: Every day | ORAL | Status: DC | PRN

## 2015-04-22 MED ORDER — SODIUM CHLORIDE 0.9 % IJ SOLN: 3.0000 mL | INTRAMUSCULAR | Status: DC | PRN

## 2015-04-22 MED ORDER — OXYCODONE-ACETAMINOPHEN 5-325 MG PO TABS: 1.0000 | ORAL_TABLET | ORAL | Status: DC | PRN

## 2015-04-22 MED ORDER — LEVALBUTEROL HCL 0.63 MG/3ML IN NEBU
0.6300 mg | INHALATION_SOLUTION | Freq: Four times a day (QID) | RESPIRATORY_TRACT | Status: DC
  Administered 2015-04-22 – 2015-04-24 (×8): 0.63 mg via RESPIRATORY_TRACT
  Filled 2015-04-22 (×8): qty 3

## 2015-04-22 MED ORDER — SODIUM CHLORIDE 0.9 % IJ SOLN
3.0000 mL | Freq: Two times a day (BID) | INTRAMUSCULAR | Status: DC
  Administered 2015-04-22 – 2015-04-23 (×2): 3 mL via INTRAVENOUS

## 2015-04-22 MED ORDER — SODIUM CHLORIDE 0.9 % IJ SOLN
3.0000 mL | Freq: Two times a day (BID) | INTRAMUSCULAR | Status: DC
  Administered 2015-04-22 – 2015-04-24 (×5): 3 mL via INTRAVENOUS

## 2015-04-22 MED ORDER — POTASSIUM CHLORIDE CRYS ER 20 MEQ PO TBCR
40.0000 meq | EXTENDED_RELEASE_TABLET | Freq: Once | ORAL | Status: AC
  Administered 2015-04-22: 40 meq via ORAL
  Filled 2015-04-22: qty 2

## 2015-04-22 MED ORDER — AZITHROMYCIN 250 MG PO TABS
250.0000 mg | ORAL_TABLET | Freq: Every day | ORAL | Status: DC
  Administered 2015-04-23 – 2015-04-24 (×2): 250 mg via ORAL
  Filled 2015-04-22 (×2): qty 1

## 2015-04-22 MED ORDER — GUAIFENESIN ER 600 MG PO TB12
600.0000 mg | ORAL_TABLET | Freq: Two times a day (BID) | ORAL | Status: DC
  Administered 2015-04-22 – 2015-04-24 (×5): 600 mg via ORAL
  Filled 2015-04-22 (×5): qty 1

## 2015-04-22 MED ORDER — ALBUTEROL (5 MG/ML) CONTINUOUS INHALATION SOLN
10.0000 mg/h | INHALATION_SOLUTION | Freq: Once | RESPIRATORY_TRACT | Status: AC
  Administered 2015-04-22: 10 mg/h via RESPIRATORY_TRACT
  Filled 2015-04-22: qty 20

## 2015-04-22 MED ORDER — TIOTROPIUM BROMIDE MONOHYDRATE 18 MCG IN CAPS
18.0000 ug | ORAL_CAPSULE | Freq: Every day | RESPIRATORY_TRACT | Status: DC
  Administered 2015-04-22 – 2015-04-24 (×3): 18 ug via RESPIRATORY_TRACT
  Filled 2015-04-22: qty 5

## 2015-04-22 MED ORDER — PANTOPRAZOLE SODIUM 20 MG PO TBEC: 20.0000 mg | DELAYED_RELEASE_TABLET | Freq: Every day | ORAL | Status: DC | PRN

## 2015-04-22 MED ORDER — SODIUM CHLORIDE 0.9 % IV SOLN
250.0000 mL | INTRAVENOUS | Status: DC | PRN
Start: 2015-04-22 — End: 2015-04-24
  Administered 2015-04-22: 250 mL via INTRAVENOUS

## 2015-04-22 MED ORDER — AZELASTINE HCL 0.1 % NA SOLN
1.0000 | Freq: Two times a day (BID) | NASAL | Status: DC
  Administered 2015-04-22 – 2015-04-24 (×5): 1 via NASAL
  Filled 2015-04-22: qty 30

## 2015-04-22 MED ORDER — METHYLPREDNISOLONE SODIUM SUCC 125 MG IJ SOLR
125.0000 mg | Freq: Once | INTRAMUSCULAR | Status: AC
  Administered 2015-04-22: 125 mg via INTRAVENOUS
  Filled 2015-04-22: qty 2

## 2015-04-22 MED ORDER — BUDESONIDE-FORMOTEROL FUMARATE 160-4.5 MCG/ACT IN AERO
2.0000 | INHALATION_SPRAY | Freq: Two times a day (BID) | RESPIRATORY_TRACT | Status: DC
  Administered 2015-04-22 – 2015-04-24 (×4): 2 via RESPIRATORY_TRACT
  Filled 2015-04-22: qty 6

## 2015-04-22 MED ORDER — GUAIFENESIN-DM 100-10 MG/5ML PO SYRP: 5.0000 mL | ORAL_SOLUTION | ORAL | Status: DC | PRN

## 2015-04-22 MED ORDER — THEOPHYLLINE ER 300 MG PO TB12
300.0000 mg | ORAL_TABLET | Freq: Two times a day (BID) | ORAL | Status: DC
  Administered 2015-04-22 – 2015-04-24 (×5): 300 mg via ORAL
  Filled 2015-04-22 (×5): qty 1

## 2015-04-22 NOTE — Progress Notes (Signed)
RT provided pt with a sputum sample cup to use when she is able to provide a sample. Pt asked to call RN if she is able to provide a sample for testing. RN aware.

## 2015-04-22 NOTE — ED Notes (Signed)
Awake. Verbally responsive. A/O x4. Resp even and unlabored. Continues to have audible exp wheezing noted. ABC's intact. ST on monitor. IV saline lock patent and intact.

## 2015-04-22 NOTE — ED Notes (Signed)
Hospitalist at bedside 

## 2015-04-22 NOTE — ED Notes (Signed)
Awake. Verbally responsive. A/O x4. Resp even and unlabored. No audible adventitious breath sounds noted. ABC's intact. ST on monitor. 

## 2015-04-22 NOTE — ED Notes (Signed)
Awake. Verbally responsive. A/O x4. Resp even and unlabored. No audible adventitious breath sounds noted. ABC's intact. ST on monitor. IV saline lock patent and intact. Droplet Precautions in place. Pt reported that she wears O2 at home but wishes to not wear here unless she desat.

## 2015-04-22 NOTE — ED Notes (Signed)
Awake. Verbally responsive. A/O x4. Resp even and unlabored. Noted audible exp wheezing noted. ABC's intact. ST on monitor. Pt receiving long-acting neb tx.

## 2015-04-22 NOTE — ED Notes (Signed)
Pt complaint of asthma exacerbation onset Wednesday; unrelieved by home neb and inhaler.

## 2015-04-22 NOTE — H&P (Signed)
History and Physical:    Sonia Drake   RUE:[id number] DOB: 09-01-1955 DOA: 04/22/2015  Referring MD/provider: Dr. Linwood Dibbles PCP: Verlon Au, MD   Chief Complaint: Cough, dypnea  History of Present Illness:   Sonia Drake is an 59 y.o. female with a PMH of COPD managed with Symbicort, Spiriva, theophylline and bronchodilators as needed, recent evaluation on 04/13/15 in the ED for pleuritic chest pain, who presents with a three-day history of worsening dyspnea and wheezing unrelieved by her home nebulizer treatments and inhalers. Shortness of breath has been associated with cough and brown sputum. Felt feverish on 04/18/15 but not since. Reports a sick contact in that her grandson recently visited and he had cold symptoms. Patient has not had an influenza vaccine due to allergy to eggs. Activity makes her symptoms worse.  She is easily fatigued. Says she couldn't talk today, but currently feels better after having received treatment in the ED consisting of albuterol, Solu-Medrol, and oxygen.  ROS:   Review of Systems  Constitutional: Negative for fever, chills and malaise/fatigue.  HENT: Positive for congestion. Negative for ear pain and sore throat.   Eyes: Negative.   Respiratory: Positive for cough, sputum production, shortness of breath and wheezing.   Cardiovascular: Positive for chest pain and leg swelling. Negative for palpitations.       Pleuritic chest pain, hurts worse with deep breaths  Gastrointestinal: Negative for heartburn, nausea, vomiting, abdominal pain, diarrhea, constipation, blood in stool and melena.  Genitourinary: Negative for dysuria.  Musculoskeletal: Positive for joint pain.       Right knee and ankle  Skin: Positive for itching and rash.       Left arm, on triamcinolone   Neurological: Positive for headaches. Negative for dizziness and weakness.  Endo/Heme/Allergies: Bruises/bleeds easily.  Psychiatric/Behavioral: Negative.      Past  Medical History:   Past Medical History  Diagnosis Date  . COPD (chronic obstructive pulmonary disease) (HCC)   . Asthma     Past Surgical History:   History reviewed. No pertinent past surgical history.  Social History:   Social History   Social History  . Marital Status: Single    Spouse Name: N/A  . Number of Children: 2  . Years of Education: N/A   Occupational History  . Disabled.    Social History Main Topics  . Smoking status: Never Smoker   . Smokeless tobacco: Not on file  . Alcohol Use: No  . Drug Use: No  . Sexual Activity: Not on file   Other Topics Concern  . Not on file   Social History Narrative   Single.  Lives with daughter.  Independent of ADLs.    Family history:   Family History  Problem Relation Age of Onset  . Heart disease Mother   . Heart disease Father   . Diabetes Father   . Asthma Father   . Diabetes Brother   . Diabetes Sister   . Asthma Brother   . Asthma Sister   . Hypertension Sister     Allergies   Contrast media; Peanuts; Shellfish allergy; Soap; and Eggs or egg-derived products  Current Medications:   Prior to Admission medications   Medication Sig Start Date End Date Taking? Authorizing Provider  albuterol (PROVENTIL HFA;VENTOLIN HFA) 108 (90 BASE) MCG/ACT inhaler Inhale into the lungs every 6 (six) hours as needed for wheezing or shortness of breath.   Yes Historical Provider, MD  AZELASTINE HCL NA Place  1 spray into the nose 2 (two) times daily.   Yes Historical Provider, MD  budesonide-formoterol (SYMBICORT) 160-4.5 MCG/ACT inhaler Inhale 2 puffs into the lungs 2 (two) times daily.   Yes Historical Provider, MD  cetirizine (ZYRTEC) 10 MG tablet Take 10 mg by mouth daily as needed for allergies.    Yes Historical Provider, MD  cholecalciferol (VITAMIN D) 1000 UNITS tablet Take 1,000 Units by mouth daily.   Yes Historical Provider, MD  ipratropium-albuterol (DUONEB) 0.5-2.5 (3) MG/3ML SOLN Take 3 mLs by  nebulization every 4 (four) hours as needed. Patient taking differently: Take 3 mLs by nebulization every 4 (four) hours as needed (wheezing/shortness of breath).  07/07/14  Yes Jeralyn BennettEzequiel Zamora, MD  montelukast (SINGULAIR) 10 MG tablet Take 10 mg by mouth at bedtime.   Yes Historical Provider, MD  naproxen (NAPROSYN) 500 MG tablet Take 1 tablet (500 mg total) by mouth 2 (two) times daily. 04/13/15  Yes Dione Boozeavid Glick, MD  oxyCODONE-acetaminophen (PERCOCET) 5-325 MG tablet Take 1 tablet by mouth every 4 (four) hours as needed for moderate pain. 04/13/15  Yes Dione Boozeavid Glick, MD  pantoprazole (PROTONIX) 20 MG tablet Take 20 mg by mouth daily as needed for indigestion.   Yes Historical Provider, MD  theophylline (THEODUR) 300 MG 12 hr tablet Take 300 mg by mouth 2 (two) times daily.   Yes Historical Provider, MD  tiotropium (SPIRIVA) 18 MCG inhalation capsule Place 18 mcg into inhaler and inhale daily.   Yes Historical Provider, MD  triamcinolone lotion (KENALOG) 0.1 % APPLY TOPICALLY ONCE DAILY AS NEEDED FOR DRY SKIN 02/27/15  Yes Historical Provider, MD    Physical Exam:   Filed Vitals:   04/22/15 1200 04/22/15 1230 04/22/15 1300 04/22/15 1330  BP: 114/59 127/63 122/72 112/80  Pulse: 127 123 123 117  Temp:      TempSrc:      Resp: 24 20 19 23   SpO2: 93% 94% 94% 93%     Physical Exam: Blood pressure 112/80, pulse 117, temperature 98.4 F (36.9 C), temperature source Oral, resp. rate 23, SpO2 93 %. Gen: No acute distress. Head: Normocephalic, atraumatic. Eyes: PERRL, EOMI, sclerae nonicteric. Mouth: Oropharynx clear. Neck: Supple, no thyromegaly, no lymphadenopathy, no jugular venous distention. Chest: Lungs with both inspiratory and expiratory wheezes throughout. CV: Heart sounds are tachycardic, regular. Abdomen: Soft, nontender, nondistended with normal active bowel sounds. Extremities: Extremities are without edema. Skin: Warm and dry. Neuro: Alert and oriented times 3; cranial nerves II  through XII grossly intact. Psych: Mood and affect normal.   Data Review:    Labs: Basic Metabolic Panel:  Recent Labs Lab 04/22/15 0954  NA 140  K 3.4*  CL 104  CO2 27  GLUCOSE 136*  BUN 8  CREATININE 0.79  CALCIUM 10.5*   CBC:  Recent Labs Lab 04/22/15 0954  WBC 6.3  HGB 13.0  HCT 42.1  MCV 90.9  PLT 246    Radiographic Studies: Dg Chest 2 View  04/22/2015  CLINICAL DATA:  Shortness of breath for 4 days. EXAM: CHEST  2 VIEW COMPARISON:  April 13, 2015. FINDINGS: The heart size and mediastinal contours are within normal limits. Both lungs are clear. No pneumothorax or pleural effusion is noted. The visualized skeletal structures are unremarkable. IMPRESSION: No active cardiopulmonary disease. Electronically Signed   By: Lupita RaiderJames  Green Jr, M.D.   On: 04/22/2015 09:44   *I have personally reviewed the images above*  EKG: Independently reviewed. Sinus tachycardia at 113 bpm. No ischemic changes.  No significant change from prior.   Assessment/Plan:   Principal Problem:   Asthma exacerbation COPD Exacerbation - Reverse airflow limitation with inhaled short-acting bronchodilators and systemic glucocorticoids. - Provide inhaled anti-cholinergic with: Spiriva. - In moderate exacerbations of COPD requiring hospitalization, meta-analyses show improved clinical response with use of antibiotics. Long-course antibiotic therapy provides no additional benefit compared to short-course therapy and increases both expense and risk associated with antibiotic exposures, therefore we will treat with azithromycin for 5-7 days. - Add Tamiflu for any clinical suspicion of influenza. Influenza panel requested. - Provide supplemental oxygenation to maintain oxygen saturation 88-92%.  Active Problems:   Hypokalemia - Replete.   DVT prophylaxis - Lovenox ordered.  Code Status / Family Communication / Disposition Plan:   Code Status: Full.  Has POA [employer]) and a living will.     Family Communication: Sonia Drake (daughter) [phone] is emergency contact. Disposition Plan: Home when respiratory status stable.  Attestation regarding necessity of inpatient status:   The appropriate admission status for this patient is INPATIENT. Inpatient status is judged to be reasonable and necessary in order to provide the required intensity of service to ensure the patient's safety. The patient's presenting symptoms, physical exam findings, and initial radiographic and laboratory data in the context of their chronic comorbidities is felt to place them at high risk for further clinical deterioration. Furthermore, it is not anticipated that the patient will be medically stable for discharge from the hospital within 2 midnights of admission. The following factors support the admission status of inpatient.   -The patient's presenting symptoms include wheezing, increased WOB, dyspnea, cough. - The worrisome physical exam findings include Tachypnea and tachycardia, bronchospasm. - The initial radiographic and laboratory data are worrisome because of hypokalemia. - The chronic co-morbidities include asthma/COPD. - Patient requires inpatient status due to high intensity of service, high risk for further deterioration and high frequency of surveillance required. - I certify that at the point of admission it is my clinical judgment that the patient will require inpatient hospital care spanning beyond 2 midnights from the point of admission.   Time spent: 1 hour.  RAMA,CHRISTINA [facility] Pager [phone] Cell: [phone]   If 7PM-7AM, please contact night-coverage [email or web address] Password [email or web address] 04/22/2015, 1:52 PM

## 2015-04-22 NOTE — ED Provider Notes (Signed)
CSN: [id number]     Arrival date & time 04/22/15  4098 History   First MD Initiated Contact with Patient 04/22/15 0902     Chief Complaint  Patient presents with  . Asthma  . Shortness of Breath    Patient is a 59 y.o. female presenting with asthma and shortness of breath. The history is provided by the patient.  Asthma This is a recurrent problem. The current episode started more than 2 days ago. The problem occurs constantly. Progression since onset: it is actually getting better but not resolved. Associated symptoms include shortness of breath. Pertinent negatives include no chest pain.  Shortness of Breath Severity:  Moderate Onset quality:  Gradual Timing:  Constant Context: activity   Relieved by:  Inhaler Worsened by:  Activity Associated symptoms: cough, sputum production (brown) and wheezing   Associated symptoms: no chest pain  Fever: felt feverish on tuesday.   Pt's grandson visited recently.  He had a cold.  Sx started after that.  Pt did not get a flu shot.  She is allergic to eggs.  Past Medical History  Diagnosis Date  . COPD (chronic obstructive pulmonary disease) (HCC)   . Asthma    History reviewed. No pertinent past surgical history. Family History  Problem Relation Age of Onset  . Heart disease Mother   . Heart disease Father   . Diabetes Father   . Asthma Father   . Diabetes Brother   . Diabetes Sister   . Asthma Brother   . Asthma Sister   . Hypertension Sister    Social History  Substance Use Topics  . Smoking status: Never Smoker   . Smokeless tobacco: None  . Alcohol Use: No   OB History    No data available     Review of Systems  Constitutional: Fever: felt feverish on tuesday.  Respiratory: Positive for cough, sputum production (brown), shortness of breath and wheezing.   Cardiovascular: Negative for chest pain.  All other systems reviewed and are negative.     Allergies  Contrast media; Peanuts; Shellfish allergy; Soap; and Eggs  or egg-derived products  Home Medications   Prior to Admission medications   Medication Sig Start Date End Date Taking? Authorizing Provider  albuterol (PROVENTIL HFA;VENTOLIN HFA) 108 (90 BASE) MCG/ACT inhaler Inhale into the lungs every 6 (six) hours as needed for wheezing or shortness of breath.   Yes Historical Provider, MD  AZELASTINE HCL NA Place 1 spray into the nose 2 (two) times daily.   Yes Historical Provider, MD  budesonide-formoterol (SYMBICORT) 160-4.5 MCG/ACT inhaler Inhale 2 puffs into the lungs 2 (two) times daily.   Yes Historical Provider, MD  cetirizine (ZYRTEC) 10 MG tablet Take 10 mg by mouth daily as needed for allergies.    Yes Historical Provider, MD  cholecalciferol (VITAMIN D) 1000 UNITS tablet Take 1,000 Units by mouth daily.   Yes Historical Provider, MD  ipratropium-albuterol (DUONEB) 0.5-2.5 (3) MG/3ML SOLN Take 3 mLs by nebulization every 4 (four) hours as needed. Patient taking differently: Take 3 mLs by nebulization every 4 (four) hours as needed (wheezing/shortness of breath).  07/07/14  Yes Jeralyn Bennett, MD  montelukast (SINGULAIR) 10 MG tablet Take 10 mg by mouth at bedtime.   Yes Historical Provider, MD  naproxen (NAPROSYN) 500 MG tablet Take 1 tablet (500 mg total) by mouth 2 (two) times daily. 04/13/15  Yes Dione Booze, MD  oxyCODONE-acetaminophen (PERCOCET) 5-325 MG tablet Take 1 tablet by mouth every 4 (four) hours  as needed for moderate pain. 04/13/15  Yes Dione Boozeavid Glick, MD  pantoprazole (PROTONIX) 20 MG tablet Take 20 mg by mouth daily as needed for indigestion.   Yes Historical Provider, MD  theophylline (THEODUR) 300 MG 12 hr tablet Take 300 mg by mouth 2 (two) times daily.   Yes Historical Provider, MD  tiotropium (SPIRIVA) 18 MCG inhalation capsule Place 18 mcg into inhaler and inhale daily.   Yes Historical Provider, MD  triamcinolone lotion (KENALOG) 0.1 % APPLY TOPICALLY ONCE DAILY AS NEEDED FOR DRY SKIN 02/27/15  Yes Historical Provider, MD   BP  131/65 mmHg  Pulse 131  Temp(Src) 98.4 F (36.9 C) (Oral)  Resp 22  SpO2 95% Physical Exam  Constitutional: She appears well-developed and well-nourished. No distress.  HENT:  Head: Normocephalic and atraumatic.  Right Ear: External ear normal.  Left Ear: External ear normal.  Eyes: Conjunctivae are normal. Right eye exhibits no discharge. Left eye exhibits no discharge. No scleral icterus.  Neck: Neck supple. No tracheal deviation present.  Cardiovascular: Regular rhythm and intact distal pulses.  Tachycardia present.   Pulmonary/Chest: Accessory muscle usage present. No stridor. Tachypnea noted. No respiratory distress. She has wheezes. She has no rales.  Abdominal: Soft. Bowel sounds are normal. She exhibits no distension. There is no tenderness. There is no rebound and no guarding.  Musculoskeletal: She exhibits no edema or tenderness.  Neurological: She is alert. She has normal strength. No cranial nerve deficit (no facial droop, extraocular movements intact, no slurred speech) or sensory deficit. She exhibits normal muscle tone. She displays no seizure activity. Coordination normal.  Skin: Skin is warm and dry. No rash noted.  Psychiatric: She has a normal mood and affect.  Nursing note and vitals reviewed.   ED Course  Procedures (including critical care time) Labs Review Labs Reviewed  BASIC METABOLIC PANEL - Abnormal; Notable for the following:    Potassium 3.4 (*)    Glucose, Bld 136 (*)    Calcium 10.5 (*)    All other components within normal limits  CBC  INFLUENZA PANEL BY PCR (TYPE A & B, H1N1)    Imaging Review Dg Chest 2 View  04/22/2015  CLINICAL DATA:  Shortness of breath for 4 days. EXAM: CHEST  2 VIEW COMPARISON:  April 13, 2015. FINDINGS: The heart size and mediastinal contours are within normal limits. Both lungs are clear. No pneumothorax or pleural effusion is noted. The visualized skeletal structures are unremarkable. IMPRESSION: No active  cardiopulmonary disease. Electronically Signed   By: Lupita RaiderJames  Green Jr, M.D.   On: 04/22/2015 09:44   I have personally reviewed and evaluated these images and lab results as part of my medical decision-making.   EKG Interpretation   Date/Time:  Saturday April 22 2015 08:56:45 EST Ventricular Rate:  113 PR Interval:  121 QRS Duration: 81 QT Interval:  295 QTC Calculation: 404 R Axis:   84 Text Interpretation:  Sinus tachycardia Borderline right axis deviation  RSR' in V1 or V2, probably normal variant Borderline repolarization  abnormality No significant change since last tracing Confirmed by Diago Haik   MD-J, Alyssamae Klinck ([id number]) on 04/22/2015 9:03:04 AM      Medications  albuterol (PROVENTIL) (2.5 MG/3ML) 0.083% nebulizer solution 5 mg (5 mg Nebulization Given 04/22/15 0853)  albuterol (PROVENTIL,VENTOLIN) solution continuous neb (10 mg/hr Nebulization Given 04/22/15 0931)  methylPREDNISolone sodium succinate (SOLU-MEDROL) 125 mg/2 mL injection 125 mg (125 mg Intravenous Given 04/22/15 1010)  ondansetron (ZOFRAN) injection 4 mg (4 mg Intravenous  Given 04/22/15 1009)    MDM   Final diagnoses:  COPD exacerbation (HCC)    1158  Pt is feeling better but she continues to wheeze.  Heart rate is in the 130s and her breathing is labored.  With her persistent symptoms I will consult for admission.    Linwood Dibbles, MD 04/22/15 1200

## 2015-04-23 DIAGNOSIS — J101 Influenza due to other identified influenza virus with other respiratory manifestations: Principal | ICD-10-CM

## 2015-04-23 HISTORY — PX: KNEE ARTHROSCOPY: SHX127

## 2015-04-23 LAB — GLUCOSE, CAPILLARY
Glucose-Capillary: 181 mg/dL — ABNORMAL HIGH (ref 65–99)
Glucose-Capillary: 162 mg/dL — ABNORMAL HIGH (ref 65–99)
Glucose-Capillary: 158 mg/dL — ABNORMAL HIGH (ref 65–99)
Glucose-Capillary: 256 mg/dL — ABNORMAL HIGH (ref 65–99)

## 2015-04-23 MED ORDER — OSELTAMIVIR PHOSPHATE 75 MG PO CAPS
75.0000 mg | ORAL_CAPSULE | Freq: Two times a day (BID) | ORAL | Status: DC
  Administered 2015-04-23 – 2015-04-24 (×3): 75 mg via ORAL
  Filled 2015-04-23 (×3): qty 1

## 2015-04-23 NOTE — Progress Notes (Signed)
[facility] PROGRESS NOTE  DOMINIK LAURICELLA ZOX:[id number] DOB: 12/13/1955 DOA: 04/22/2015  PCP: Verlon Au, MD  Brief HPI: 60 year old female with a past medical history of COPD, presented with 3 day history of worsening dyspnea and wheezing. Hospitalized for further management of acute COPD exacerbation.  Past medical history:  Past Medical History  Diagnosis Date  . COPD (chronic obstructive pulmonary disease) (HCC)   . Asthma     Consultants: None  Procedures: None  Antibiotics: Azithromycin  Subjective: Patient feels slightly better this morning. Not wheezing as much. Denies any chest pain. No nausea, vomiting. Wants to ambulate.  Objective: Vital Signs  Filed Vitals:   04/22/15 1940 04/22/15 2038 04/23/15 0342 04/23/15 1427  BP:  135/73 150/68 111/59  Pulse:  104 104 104  Temp:  98.6 F (37 C) 97.7 F (36.5 C) 97.7 F (36.5 C)  TempSrc:  Oral Oral Oral  Resp:  20 18 24   Height:      Weight:      SpO2: 95% 96% 95% 96%    Intake/Output Summary (Last 24 hours) at 04/23/15 1429 Last data filed at 04/23/15 0700  Gross per 24 hour  Intake    870 ml  Output      0 ml  Net    870 ml   Filed Weights   04/22/15 1357  Weight: 94.031 kg (207 lb 4.8 oz)    General appearance: alert, cooperative, appears stated age and no distress Resp: Wheezing heard. Bilateral lung fields. No crackles. No rhonchi. Good air entry. Cardio: S1, S2 is tachycardic. Regular. No S3, S4. No rubs, murmurs, bruit. No pedal edema. GI: soft, non-tender; bowel sounds normal; no masses,  no organomegaly Extremities: extremities normal, atraumatic, no cyanosis or edema Neurologic: No focal deficits  Lab Results:  Basic Metabolic Panel:  Recent Labs Lab 04/22/15 0954 04/22/15 1430  NA 140  --   K 3.4*  --   CL 104  --   CO2 27  --   GLUCOSE 136*  --   BUN 8  --   CREATININE 0.79 0.78  CALCIUM 10.5*  --    Liver Function Tests:  Recent Labs Lab  04/22/15 1430  AST 29  ALT 24  ALKPHOS 84  BILITOT 0.4  PROT 7.6  ALBUMIN 4.2   CBC:  Recent Labs Lab 04/22/15 0954 04/22/15 1430  WBC 6.3 6.8  HGB 13.0 12.9  HCT 42.1 39.9  MCV 90.9 89.7  PLT 246 230   CBG:  Recent Labs Lab 04/22/15 1648 04/22/15 2131 04/23/15 0802 04/23/15 1212  GLUCAP 341* 280* 181* 162*    No results found for this or any previous visit (from the past 240 hour(s)).    Studies/Results: Dg Chest 2 View  04/22/2015  CLINICAL DATA:  Shortness of breath for 4 days. EXAM: CHEST  2 VIEW COMPARISON:  April 13, 2015. FINDINGS: The heart size and mediastinal contours are within normal limits. Both lungs are clear. No pneumothorax or pleural effusion is noted. The visualized skeletal structures are unremarkable. IMPRESSION: No active cardiopulmonary disease. Electronically Signed   By: Lupita Raider, M.D.   On: 04/22/2015 09:44    Medications:  Scheduled: . azelastine  1 spray Each Nare BID  . azithromycin  250 mg Oral Daily  . budesonide-formoterol  2 puff Inhalation BID  . cholecalciferol  1,000 Units Oral Daily  . enoxaparin (LOVENOX) injection  40 mg Subcutaneous Q24H  . guaiFENesin  600 mg Oral  BID  . insulin aspart  0-20 Units Subcutaneous TID WC  . insulin aspart  0-5 Units Subcutaneous QHS  . levalbuterol  0.63 mg Nebulization Q6H  . loratadine  10 mg Oral Daily  . methylPREDNISolone (SOLU-MEDROL) injection  60 mg Intravenous Q6H  . montelukast  10 mg Oral QHS  . naproxen  500 mg Oral BID  . oseltamivir  75 mg Oral BID  . sodium chloride  3 mL Intravenous Q12H  . sodium chloride  3 mL Intravenous Q12H  . theophylline  300 mg Oral BID  . tiotropium  18 mcg Inhalation Daily  . triamcinolone lotion   Topical BID   Continuous:  ZOX:WRUEAVPRN:sodium chloride, acetaminophen **OR** acetaminophen, guaiFENesin-dextromethorphan, levalbuterol, ondansetron **OR** ondansetron (ZOFRAN) IV, oxyCODONE-acetaminophen, pantoprazole, polyethylene glycol,  sodium chloride  Assessment/Plan:  Principal Problem:   Asthma exacerbation Active Problems:   Hypokalemia    Acute COPD exacerbation Likely triggered by influenza. Patient seems to be improving. Continue current management for now. This includes nebulizer treatments, steroids and antibiotics. Continue with inhaled steroids as well.  Influenza Initiate Tamiflu.  Hypokalemia This was repleted. Recheck labs tomorrow.  Sinus tachycardia Secondary to nebulizer treatments and COPD exacerbation. Continue to monitor.  DVT Prophylaxis: Lovenox    Code Status: Full code  Family Communication: Discussed with the patient  Disposition Plan: Mobilize as tolerated. Continue current management.    LOS: 1 day   [facility] Regional Medical CenterKRISHNAN,Charnita Trudel  [facility] Pager [phone] 04/23/2015, 2:29 PM  If 7PM-7AM, please contact night-coverage at [email or web address], password [email or web address]

## 2015-04-24 LAB — BASIC METABOLIC PANEL
Anion gap: 10 (ref 5–15)
BUN: 15 mg/dL (ref 6–20)
CO2: 27 mmol/L (ref 22–32)
Calcium: 10.3 mg/dL (ref 8.9–10.3)
Chloride: 102 mmol/L (ref 101–111)
Creatinine, Ser: 0.75 mg/dL (ref 0.44–1.00)
GFR calc Af Amer: >60 mL/min (ref >60)
GFR calc non Af Amer: >60 mL/min (ref >60)
Glucose, Bld: 188 mg/dL — ABNORMAL HIGH (ref 65–99)
Potassium: 4.8 mmol/L (ref 3.5–5.1)
Sodium: 139 mmol/L (ref 135–145)

## 2015-04-24 LAB — CBC
HCT: 39.9 % (ref 36.0–46.0)
Hemoglobin: 12.6 g/dL (ref 12.0–15.0)
MCH: 28.3 pg (ref 26.0–34.0)
MCHC: 31.6 g/dL (ref 30.0–36.0)
MCV: 89.5 fL (ref 78.0–100.0)
Platelets: 240 10*3/uL (ref 150–400)
RBC: 4.46 MIL/uL (ref 3.87–5.11)
RDW: 14.9 % (ref 11.5–15.5)
WBC: 14.4 10*3/uL — ABNORMAL HIGH (ref 4.0–10.5)

## 2015-04-24 LAB — GLUCOSE, CAPILLARY: Glucose-Capillary: 229 mg/dL — ABNORMAL HIGH (ref 65–99)

## 2015-04-24 MED ORDER — AZITHROMYCIN 250 MG PO TABS: 250.0000 mg | ORAL_TABLET | Freq: Every day | ORAL | Status: DC

## 2015-04-24 MED ORDER — PREDNISONE 20 MG PO TABS: ORAL_TABLET | ORAL | Status: DC

## 2015-04-24 MED ORDER — OSELTAMIVIR PHOSPHATE 75 MG PO CAPS: 75.0000 mg | ORAL_CAPSULE | Freq: Two times a day (BID) | ORAL | Status: DC

## 2015-04-24 NOTE — Discharge Summary (Signed)
[facility]  Physician Discharge Summary   Patient ID: Sonia HummerMary J Drake MRN: [id number] DOB/AGE: [birth year] 60 y.o.  Admit date: 04/22/2015 Discharge date: 04/24/2015  PCP: Verlon AuBoyd, Tammy Lamonica, MD  DISCHARGE DIAGNOSES:  Principal Problem:   Asthma exacerbation Active Problems:   Hypokalemia   Influenza B   RECOMMENDATIONS FOR OUTPATIENT FOLLOW UP: 1. Please check blood glucose level at follow-up  DISCHARGE CONDITION: fair  Diet recommendation: Low sodium  Filed Weights   04/22/15 1357  Weight: 94.031 kg (207 lb 4.8 oz)    INITIAL HISTORY: 60 year old female with a past medical history of COPD, presented with 3 day history of worsening dyspnea and wheezing. Hospitalized for further management of acute COPD exacerbation.  Consultations:  None  Procedures:  None  HOSPITAL COURSE:   Acute COPD exacerbation Likely triggered by influenza. Patient was started on nebulizer treatments and systemic steroids. She was continued on her home medications. She slowly started improving. She was also given antibiotics. Patient is back to baseline now. She has ambulated without any difficulties. She is okay for discharge home today.   Influenza Influenza status positive. She was started on Tamiflu. She is afebrile.  Hypokalemia This was repleted.   Sinus tachycardia Secondary to nebulizer treatments and COPD exacerbation. Improved this morning.  Overall, patient is better. Wants to go home. Okay for discharge.   PERTINENT LABS:  The results of significant diagnostics from this hospitalization (including imaging, microbiology, ancillary and laboratory) are listed below for reference.     Labs: Basic Metabolic Panel:  Recent Labs Lab 04/22/15 0954 04/22/15 1430 04/24/15 0447  NA 140  --  139  K 3.4*  --  4.8  CL 104  --  102  CO2 27  --  27  GLUCOSE 136*  --  188*  BUN 8  --  15  CREATININE 0.79 0.78 0.75  CALCIUM 10.5*  --  10.3   Liver Function  Tests:  Recent Labs Lab 04/22/15 1430  AST 29  ALT 24  ALKPHOS 84  BILITOT 0.4  PROT 7.6  ALBUMIN 4.2   CBC:  Recent Labs Lab 04/22/15 0954 04/22/15 1430 04/24/15 0447  WBC 6.3 6.8 14.4*  HGB 13.0 12.9 12.6  HCT 42.1 39.9 39.9  MCV 90.9 89.7 89.5  PLT 246 230 240     CBG:  Recent Labs Lab 04/23/15 0802 04/23/15 1212 04/23/15 1646 04/23/15 2206 04/24/15 0742  GLUCAP 181* 162* 158* 256* 229*     IMAGING STUDIES Dg Chest 2 View  04/22/2015  CLINICAL DATA:  Shortness of breath for 4 days. EXAM: CHEST  2 VIEW COMPARISON:  April 13, 2015. FINDINGS: The heart size and mediastinal contours are within normal limits. Both lungs are clear. No pneumothorax or pleural effusion is noted. The visualized skeletal structures are unremarkable. IMPRESSION: No active cardiopulmonary disease. Electronically Signed   By: Lupita RaiderJames  Green Jr, M.D.   On: 04/22/2015 09:44    DISCHARGE EXAMINATION: Filed Vitals:   04/23/15 2207 04/24/15 0118 04/24/15 0519 04/24/15 0730  BP: 136/66  108/56   Pulse: 103  87   Temp: 97.7 F (36.5 C)  97.4 F (36.3 C)   TempSrc: Oral  Oral   Resp: 26  24 22   Height:      Weight:      SpO2: 95% 98% 96% 96%   General appearance: alert, cooperative, appears stated age and no distress Resp: Improved air entry bilaterally. Only scattered wheezes were heard today. No crackles. No rhonchi. Cardio:  regular rate and rhythm, S1, S2 normal, no murmur, click, rub or gallop GI: soft, non-tender; bowel sounds normal; no masses,  no organomegaly Extremities: extremities normal, atraumatic, no cyanosis or edema  DISPOSITION: Home  Discharge Instructions    Call MD for:  difficulty breathing, headache or visual disturbances    Complete by:  As directed      Call MD for:  extreme fatigue    Complete by:  As directed      Call MD for:  persistant dizziness or light-headedness    Complete by:  As directed      Call MD for:  persistant nausea and vomiting     Complete by:  As directed      Call MD for:  severe uncontrolled pain    Complete by:  As directed      Call MD for:  temperature >100.4    Complete by:  As directed      Diet - low sodium heart healthy    Complete by:  As directed      Discharge instructions    Complete by:  As directed   Please see your PCP in 1 week. Please take medications as prescribed. Have your PCP check your blood sugar at follow up. Blood sugar will be high as you are taking the prednisone. It should gradually decrease to normal.  You were cared for by a hospitalist during your hospital stay. If you have any questions about your discharge medications or the care you received while you were in the hospital after you are discharged, you can call the unit and asked to speak with the hospitalist on call if the hospitalist that took care of you is not available. Once you are discharged, your primary care physician will handle any further medical issues. Please note that NO REFILLS for any discharge medications will be authorized once you are discharged, as it is imperative that you return to your primary care physician (or establish a relationship with a primary care physician if you do not have one) for your aftercare needs so that they can reassess your need for medications and monitor your lab values. If you do not have a primary care physician, you can call [phone] for a physician referral.     Increase activity slowly    Complete by:  As directed            ALLERGIES:  Allergies  Allergen Reactions  . Contrast Media [Iodinated Diagnostic Agents]     Cardiac arrest  . Peanuts [Peanut Oil] Shortness Of Breath and Swelling  . Shellfish Allergy Shortness Of Breath and Swelling  . Soap     Laundry detergent-swelling/shortness of breath.  . Eggs Or Egg-Derived Products Rash     Discharge Medication List as of 04/24/2015 10:30 AM    START taking these medications   Details  azithromycin (ZITHROMAX) 250 MG tablet  Take 1 tablet (250 mg total) by mouth daily. For 3 more days, Starting 04/24/2015, Until Discontinued, Print    oseltamivir (TAMIFLU) 75 MG capsule Take 1 capsule (75 mg total) by mouth 2 (two) times daily. For 4 more days, Starting 04/24/2015, Until Discontinued, Print    predniSONE (DELTASONE) 20 MG tablet Take 3 tablets once daily for 3 days, then take 2 tablets once daily for 3 days, then take 1 tablet once daily for 3 days, then STOP., Print      CONTINUE these medications which have NOT CHANGED   Details  albuterol (  PROVENTIL HFA;VENTOLIN HFA) 108 (90 BASE) MCG/ACT inhaler Inhale into the lungs every 6 (six) hours as needed for wheezing or shortness of breath., Until Discontinued, Historical Med    AZELASTINE HCL NA Place 1 spray into the nose 2 (two) times daily., Until Discontinued, Historical Med    budesonide-formoterol (SYMBICORT) 160-4.5 MCG/ACT inhaler Inhale 2 puffs into the lungs 2 (two) times daily., Until Discontinued, Historical Med    cetirizine (ZYRTEC) 10 MG tablet Take 10 mg by mouth daily as needed for allergies. , Until Discontinued, Historical Med    cholecalciferol (VITAMIN D) 1000 UNITS tablet Take 1,000 Units by mouth daily., Until Discontinued, Historical Med    ipratropium-albuterol (DUONEB) 0.5-2.5 (3) MG/3ML SOLN Take 3 mLs by nebulization every 4 (four) hours as needed., Starting 07/07/2014, Until Discontinued, Print    montelukast (SINGULAIR) 10 MG tablet Take 10 mg by mouth at bedtime., Until Discontinued, Historical Med    naproxen (NAPROSYN) 500 MG tablet Take 1 tablet (500 mg total) by mouth 2 (two) times daily., Starting 04/13/2015, Until Discontinued, Print    oxyCODONE-acetaminophen (PERCOCET) 5-325 MG tablet Take 1 tablet by mouth every 4 (four) hours as needed for moderate pain., Starting 04/13/2015, Until Discontinued, Print    pantoprazole (PROTONIX) 20 MG tablet Take 20 mg by mouth daily as needed for indigestion., Until Discontinued, Historical Med     theophylline (THEODUR) 300 MG 12 hr tablet Take 300 mg by mouth 2 (two) times daily., Until Discontinued, Historical Med    tiotropium (SPIRIVA) 18 MCG inhalation capsule Place 18 mcg into inhaler and inhale daily., Until Discontinued, Historical Med    triamcinolone lotion (KENALOG) 0.1 % APPLY TOPICALLY ONCE DAILY AS NEEDED FOR DRY SKIN, Historical Med       Follow-up Information    Follow up with Verlon Au, MD. Schedule an appointment as soon as possible for a visit in 1 week.   Specialty:  Family Medicine   Why:  post hospitalization follow up   Contact information:   [address] [facility] [place] [address] [phone]       TOTAL DISCHARGE TIME: 35 minutes  [facility]  [facility] Pager [phone]  04/24/2015, 1:54 PM

## 2015-04-24 NOTE — Progress Notes (Signed)
Completed D/C teaching with patient. Gave prescription. Answered questions. Pt will be D/C home with family.

## 2015-04-24 NOTE — Discharge Instructions (Signed)

## 2015-07-21 ENCOUNTER — Emergency Department (HOSPITAL_COMMUNITY): Payer: Medicaid Other

## 2015-07-21 ENCOUNTER — Encounter (HOSPITAL_COMMUNITY): Payer: Self-pay

## 2015-07-21 ENCOUNTER — Inpatient Hospital Stay (HOSPITAL_COMMUNITY)
Admission: EM | Admit: 2015-07-21 | Discharge: 2015-07-24 | DRG: 191 | Disposition: A | Discharge status: Home / Self Care | Payer: Medicaid Other | Attending: Internal Medicine | Admitting: Internal Medicine

## 2015-07-21 DIAGNOSIS — Z825 Family history of asthma and other chronic lower respiratory diseases: Secondary | ICD-10-CM

## 2015-07-21 DIAGNOSIS — Z7952 Long term (current) use of systemic steroids: Secondary | ICD-10-CM

## 2015-07-21 DIAGNOSIS — Z91041 Radiographic dye allergy status: Secondary | ICD-10-CM

## 2015-07-21 DIAGNOSIS — R0600 Dyspnea, unspecified: Secondary | ICD-10-CM | POA: Diagnosis present

## 2015-07-21 DIAGNOSIS — Z91012 Allergy to eggs: Secondary | ICD-10-CM

## 2015-07-21 DIAGNOSIS — J441 Chronic obstructive pulmonary disease with (acute) exacerbation: Principal | ICD-10-CM | POA: Diagnosis present

## 2015-07-21 DIAGNOSIS — Z9109 Other allergy status, other than to drugs and biological substances: Secondary | ICD-10-CM | POA: Diagnosis not present

## 2015-07-21 DIAGNOSIS — Z91013 Allergy to seafood: Secondary | ICD-10-CM

## 2015-07-21 DIAGNOSIS — Z79899 Other long term (current) drug therapy: Secondary | ICD-10-CM | POA: Diagnosis not present

## 2015-07-21 DIAGNOSIS — J45901 Unspecified asthma with (acute) exacerbation: Secondary | ICD-10-CM | POA: Diagnosis present

## 2015-07-21 DIAGNOSIS — Z833 Family history of diabetes mellitus: Secondary | ICD-10-CM

## 2015-07-21 DIAGNOSIS — E875 Hyperkalemia: Secondary | ICD-10-CM | POA: Diagnosis present

## 2015-07-21 DIAGNOSIS — Z8249 Family history of ischemic heart disease and other diseases of the circulatory system: Secondary | ICD-10-CM | POA: Diagnosis not present

## 2015-07-21 DIAGNOSIS — Z91010 Allergy to peanuts: Secondary | ICD-10-CM

## 2015-07-21 DIAGNOSIS — E876 Hypokalemia: Secondary | ICD-10-CM | POA: Diagnosis present

## 2015-07-21 DIAGNOSIS — J9601 Acute respiratory failure with hypoxia: Secondary | ICD-10-CM | POA: Diagnosis not present

## 2015-07-21 LAB — BASIC METABOLIC PANEL
Anion gap: 10 (ref 5–15)
BUN: 8 mg/dL (ref 6–20)
CO2: 27 mmol/L (ref 22–32)
Calcium: 10.3 mg/dL (ref 8.9–10.3)
Chloride: 104 mmol/L (ref 101–111)
Creatinine, Ser: 0.98 mg/dL (ref 0.44–1.00)
GFR calc Af Amer: >60 mL/min (ref >60)
GFR calc non Af Amer: >60 mL/min (ref >60)
Glucose, Bld: 174 mg/dL — ABNORMAL HIGH (ref 65–99)
Potassium: 3.4 mmol/L — ABNORMAL LOW (ref 3.5–5.1)
Sodium: 141 mmol/L (ref 135–145)

## 2015-07-21 LAB — CBC
HCT: 41.1 % (ref 36.0–46.0)
Hemoglobin: 13.2 g/dL (ref 12.0–15.0)
MCH: 28.3 pg (ref 26.0–34.0)
MCHC: 32.1 g/dL (ref 30.0–36.0)
MCV: 88 fL (ref 78.0–100.0)
Platelets: 264 10*3/uL (ref 150–400)
RBC: 4.67 MIL/uL (ref 3.87–5.11)
RDW: 15.1 % (ref 11.5–15.5)
WBC: 8.5 10*3/uL (ref 4.0–10.5)

## 2015-07-21 LAB — BLOOD GAS, VENOUS
Acid-Base Excess: 1.3 mmol/L (ref 0.0–2.0)
Bicarbonate: 26.4 mEq/L — ABNORMAL HIGH (ref 20.0–24.0)
Drawn by: 235321
FIO2: 0.21
O2 Saturation: 44.4 %
Patient temperature: 98.6
TCO2: 24.2 mmol/L (ref 0–100)
pCO2, Ven: 46.2 mmHg (ref 45.0–50.0)
pH, Ven: 7.376 — ABNORMAL HIGH (ref 7.250–7.300)

## 2015-07-21 MED ORDER — HYDROCODONE-ACETAMINOPHEN 5-325 MG PO TABS: 1.0000 | ORAL_TABLET | ORAL | Status: DC | PRN

## 2015-07-21 MED ORDER — LORATADINE 10 MG PO TABS
10.0000 mg | ORAL_TABLET | Freq: Every day | ORAL | Status: DC
  Administered 2015-07-22 – 2015-07-24 (×3): 10 mg via ORAL
  Filled 2015-07-21 (×4): qty 1

## 2015-07-21 MED ORDER — IPRATROPIUM BROMIDE 0.02 % IN SOLN: 1.0000 mg | Freq: Four times a day (QID) | RESPIRATORY_TRACT | Status: DC

## 2015-07-21 MED ORDER — IPRATROPIUM BROMIDE 0.02 % IN SOLN
1.0000 mg | Freq: Once | RESPIRATORY_TRACT | Status: AC
  Administered 2015-07-21: 1 mg via RESPIRATORY_TRACT

## 2015-07-21 MED ORDER — ACETAMINOPHEN 650 MG RE SUPP: 650.0000 mg | Freq: Four times a day (QID) | RECTAL | Status: DC | PRN

## 2015-07-21 MED ORDER — ALBUTEROL SULFATE (2.5 MG/3ML) 0.083% IN NEBU: 2.5000 mg | INHALATION_SOLUTION | RESPIRATORY_TRACT | Status: DC | PRN

## 2015-07-21 MED ORDER — ALBUTEROL SULFATE (2.5 MG/3ML) 0.083% IN NEBU
2.5000 mg | INHALATION_SOLUTION | Freq: Once | RESPIRATORY_TRACT | Status: DC
  Filled 2015-07-21: qty 3

## 2015-07-21 MED ORDER — ONDANSETRON HCL 4 MG PO TABS: 4.0000 mg | ORAL_TABLET | Freq: Four times a day (QID) | ORAL | Status: DC | PRN

## 2015-07-21 MED ORDER — LEVALBUTEROL HCL 0.63 MG/3ML IN NEBU: 0.6300 mg | INHALATION_SOLUTION | RESPIRATORY_TRACT | Status: DC | PRN

## 2015-07-21 MED ORDER — POTASSIUM CHLORIDE CRYS ER 20 MEQ PO TBCR
40.0000 meq | EXTENDED_RELEASE_TABLET | Freq: Once | ORAL | Status: AC
  Administered 2015-07-21: 40 meq via ORAL
  Filled 2015-07-21: qty 2

## 2015-07-21 MED ORDER — MONTELUKAST SODIUM 10 MG PO TABS
10.0000 mg | ORAL_TABLET | Freq: Every day | ORAL | Status: DC
  Administered 2015-07-21 – 2015-07-23 (×3): 10 mg via ORAL
  Filled 2015-07-21 (×3): qty 1

## 2015-07-21 MED ORDER — IPRATROPIUM-ALBUTEROL 0.5-2.5 (3) MG/3ML IN SOLN
3.0000 mL | RESPIRATORY_TRACT | Status: DC
  Filled 2015-07-21: qty 6

## 2015-07-21 MED ORDER — METHYLPREDNISOLONE SODIUM SUCC 125 MG IJ SOLR
60.0000 mg | Freq: Two times a day (BID) | INTRAMUSCULAR | Status: DC
  Administered 2015-07-22 – 2015-07-23 (×3): 60 mg via INTRAVENOUS
  Filled 2015-07-21 (×4): qty 2

## 2015-07-21 MED ORDER — ALBUTEROL SULFATE (2.5 MG/3ML) 0.083% IN NEBU
5.0000 mg | INHALATION_SOLUTION | Freq: Once | RESPIRATORY_TRACT | Status: AC
  Administered 2015-07-21: 5 mg via RESPIRATORY_TRACT
  Filled 2015-07-21: qty 6

## 2015-07-21 MED ORDER — IPRATROPIUM-ALBUTEROL 0.5-2.5 (3) MG/3ML IN SOLN
3.0000 mL | RESPIRATORY_TRACT | Status: DC
  Administered 2015-07-22 (×4): 3 mL via RESPIRATORY_TRACT
  Filled 2015-07-21 (×4): qty 3

## 2015-07-21 MED ORDER — ACETAMINOPHEN 325 MG PO TABS: 650.0000 mg | ORAL_TABLET | Freq: Four times a day (QID) | ORAL | Status: DC | PRN

## 2015-07-21 MED ORDER — IPRATROPIUM-ALBUTEROL 0.5-2.5 (3) MG/3ML IN SOLN
3.0000 mL | Freq: Once | RESPIRATORY_TRACT | Status: AC
  Administered 2015-07-21: 3 mL via RESPIRATORY_TRACT

## 2015-07-21 MED ORDER — SODIUM CHLORIDE 0.9 % IV SOLN
INTRAVENOUS | Status: DC
  Administered 2015-07-21: via INTRAVENOUS

## 2015-07-21 MED ORDER — ENOXAPARIN SODIUM 40 MG/0.4ML ~~LOC~~ SOLN: 40.0000 mg | Freq: Every day | SUBCUTANEOUS | Status: DC

## 2015-07-21 MED ORDER — IPRATROPIUM BROMIDE 0.02 % IN SOLN
1.5000 mg | Freq: Once | RESPIRATORY_TRACT | Status: DC
  Filled 2015-07-21: qty 7.5

## 2015-07-21 MED ORDER — DOXYCYCLINE HYCLATE 100 MG PO TABS
100.0000 mg | ORAL_TABLET | Freq: Two times a day (BID) | ORAL | Status: DC
  Administered 2015-07-21 – 2015-07-24 (×6): 100 mg via ORAL
  Filled 2015-07-21 (×6): qty 1

## 2015-07-21 MED ORDER — METHYLPREDNISOLONE SODIUM SUCC 125 MG IJ SOLR
125.0000 mg | Freq: Once | INTRAMUSCULAR | Status: AC
  Administered 2015-07-21: 125 mg via INTRAVENOUS
  Filled 2015-07-21: qty 2

## 2015-07-21 MED ORDER — ONDANSETRON HCL 4 MG/2ML IJ SOLN: 4.0000 mg | Freq: Four times a day (QID) | INTRAMUSCULAR | Status: DC | PRN

## 2015-07-21 MED ORDER — MAGNESIUM SULFATE 2 GM/50ML IV SOLN
2.0000 g | Freq: Once | INTRAVENOUS | Status: AC
  Administered 2015-07-21: 2 g via INTRAVENOUS
  Filled 2015-07-21: qty 50

## 2015-07-21 MED ORDER — GUAIFENESIN ER 600 MG PO TB12
600.0000 mg | ORAL_TABLET | Freq: Two times a day (BID) | ORAL | Status: DC
  Administered 2015-07-21 – 2015-07-24 (×6): 600 mg via ORAL
  Filled 2015-07-21 (×6): qty 1

## 2015-07-21 MED ORDER — MOMETASONE FURO-FORMOTEROL FUM 200-5 MCG/ACT IN AERO
2.0000 | INHALATION_SPRAY | Freq: Two times a day (BID) | RESPIRATORY_TRACT | Status: DC
  Administered 2015-07-22 – 2015-07-24 (×4): 2 via RESPIRATORY_TRACT
  Filled 2015-07-21: qty 8.8

## 2015-07-21 MED ORDER — PANTOPRAZOLE SODIUM 20 MG PO TBEC
20.0000 mg | DELAYED_RELEASE_TABLET | Freq: Every day | ORAL | Status: DC | PRN
  Filled 2015-07-21: qty 1

## 2015-07-21 MED ORDER — ALBUTEROL (5 MG/ML) CONTINUOUS INHALATION SOLN
10.0000 mg/h | INHALATION_SOLUTION | RESPIRATORY_TRACT | Status: AC
  Administered 2015-07-21: 10 mg/h via RESPIRATORY_TRACT
  Filled 2015-07-21: qty 20

## 2015-07-21 MED ORDER — SODIUM CHLORIDE 0.9% FLUSH
3.0000 mL | Freq: Two times a day (BID) | INTRAVENOUS | Status: DC
  Administered 2015-07-21 – 2015-07-24 (×6): 3 mL via INTRAVENOUS

## 2015-07-21 NOTE — ED Provider Notes (Signed)
CSN: [id number]     Arrival date & time 07/21/15  1952 History   First MD Initiated Contact with Patient 07/21/15 2015     Chief Complaint  Patient presents with  . Asthma     (Consider location/radiation/quality/duration/timing/severity/associated sxs/prior Treatment) Patient is a 60 y.o. female presenting with asthma. The history is provided by the patient.  Asthma This is a recurrent problem. The current episode started 12 to 24 hours ago. The problem occurs constantly. The problem has not changed since onset.Associated symptoms include shortness of breath. Pertinent negatives include no chest pain and no headaches. Nothing relieves the symptoms. She has tried nothing for the symptoms. The treatment provided no relief.   60 yo F With a chief complaint shortness of breath. This been going on for the past couple days. Started last night. Has a history of COPD has been trying her home breathing treatments without relief. Was seen yesterday in her doctor's office had a injection of both antibiotics and steroids. Patient is unsure which she got each. Had minimal relief and had worsening throughout the day today. Denies fevers or chills. Patient thinks that the seasonal changes are the issue.  Past Medical History  Diagnosis Date  . COPD (chronic obstructive pulmonary disease) (HCC)   . Asthma    History reviewed. No pertinent past surgical history. Family History  Problem Relation Age of Onset  . Heart disease Mother   . Heart disease Father   . Diabetes Father   . Asthma Father   . Diabetes Brother   . Diabetes Sister   . Asthma Brother   . Asthma Sister   . Hypertension Sister    Social History  Substance Use Topics  . Smoking status: Never Smoker   . Smokeless tobacco: None  . Alcohol Use: No   OB History    No data available     Review of Systems  Constitutional: Negative for fever and chills.  HENT: Negative for congestion and rhinorrhea.   Eyes: Negative for redness  and visual disturbance.  Respiratory: Positive for shortness of breath and wheezing.   Cardiovascular: Negative for chest pain and palpitations.  Gastrointestinal: Negative for nausea and vomiting.  Genitourinary: Negative for dysuria and urgency.  Musculoskeletal: Negative for myalgias and arthralgias.  Skin: Negative for pallor and wound.  Neurological: Negative for dizziness and headaches.      Allergies  Contrast media; Peanuts; Shellfish allergy; Soap; and Eggs or egg-derived products  Home Medications   Prior to Admission medications   Medication Sig Start Date End Date Taking? Authorizing Provider  albuterol (PROVENTIL HFA;VENTOLIN HFA) 108 (90 BASE) MCG/ACT inhaler Inhale into the lungs every 6 (six) hours as needed for wheezing or shortness of breath.   Yes Historical Provider, MD  AZELASTINE HCL NA Place 1 spray into the nose 2 (two) times daily.   Yes Historical Provider, MD  azithromycin (ZITHROMAX) 250 MG tablet Take 1 tablet (250 mg total) by mouth daily. For 3 more days 04/24/15  Yes Osvaldo Shipper, MD  budesonide-formoterol [facility]) 160-4.5 MCG/ACT inhaler Inhale 2 puffs into the lungs 2 (two) times daily.   Yes Historical Provider, MD  cetirizine (ZYRTEC) 10 MG tablet Take 10 mg by mouth daily as needed for allergies.    Yes Historical Provider, MD  cholecalciferol (VITAMIN D) 1000 UNITS tablet Take 1,000 Units by mouth daily.   Yes Historical Provider, MD  ipratropium-albuterol (DUONEB) 0.5-2.5 (3) MG/3ML SOLN Take 3 mLs by nebulization every 4 (four) hours as  needed. Patient taking differently: Take 3 mLs by nebulization every 4 (four) hours as needed (wheezing/shortness of breath).  07/07/14  Yes Jeralyn BennettEzequiel Zamora, MD  montelukast (SINGULAIR) 10 MG tablet Take 10 mg by mouth at bedtime.   Yes Historical Provider, MD  pantoprazole (PROTONIX) 20 MG tablet Take 20 mg by mouth daily as needed for indigestion.   Yes Historical Provider, MD  predniSONE (DELTASONE) 20 MG  tablet Take 3 tablets once daily for 3 days, then take 2 tablets once daily for 3 days, then take 1 tablet once daily for 3 days, then STOP. 04/24/15  Yes Osvaldo ShipperGokul Krishnan, MD  theophylline (THEODUR) 300 MG 12 hr tablet Take 300 mg by mouth 2 (two) times daily.   Yes Historical Provider, MD  tiotropium (SPIRIVA) 18 MCG inhalation capsule Place 18 mcg into inhaler and inhale daily.   Yes Historical Provider, MD  triamcinolone lotion (KENALOG) 0.1 % APPLY TOPICALLY ONCE DAILY AS NEEDED FOR DRY SKIN 02/27/15  Yes Historical Provider, MD  naproxen (NAPROSYN) 500 MG tablet Take 1 tablet (500 mg total) by mouth 2 (two) times daily. Patient not taking: Reported on 07/21/2015 04/13/15   Dione Boozeavid Glick, MD  oseltamivir (TAMIFLU) 75 MG capsule Take 1 capsule (75 mg total) by mouth 2 (two) times daily. For 4 more days Patient not taking: Reported on 07/21/2015 04/24/15   Osvaldo ShipperGokul Krishnan, MD  oxyCODONE-acetaminophen (PERCOCET) 5-325 MG tablet Take 1 tablet by mouth every 4 (four) hours as needed for moderate pain. Patient not taking: Reported on 07/21/2015 04/13/15   Dione Boozeavid Glick, MD   BP 113/65 mmHg  Pulse 120  Temp(Src) 98.4 F (36.9 C) (Oral)  Resp 22  Ht 5\' 3"  (1.6 m)  Wt 140 lb (63.504 kg)  BMI 24.81 kg/m2  SpO2 100% Physical Exam  Constitutional: She is oriented to person, place, and time. She appears well-developed and well-nourished. No distress.  HENT:  Head: Normocephalic and atraumatic.  Eyes: EOM are normal. Pupils are equal, round, and reactive to light.  Neck: Normal range of motion. Neck supple.  Cardiovascular: Normal rate and regular rhythm.  Exam reveals no gallop and no friction rub.   No murmur heard. Pulmonary/Chest: She is in respiratory distress. She has wheezes. She has no rales.  Diffuse wheezes, prolonged expiration diminished breath sounds  Abdominal: Soft. She exhibits no distension. There is no tenderness.  Musculoskeletal: She exhibits no edema or tenderness.  Neurological: She is  alert and oriented to person, place, and time.  Skin: Skin is warm and dry. She is not diaphoretic.  Psychiatric: She has a normal mood and affect. Her behavior is normal.  Nursing note and vitals reviewed.   ED Course  Procedures (including critical care time) Labs Review Labs Reviewed  BASIC METABOLIC PANEL - Abnormal; Notable for the following:    Potassium 3.4 (*)    Glucose, Bld 174 (*)    All other components within normal limits  BLOOD GAS, VENOUS - Abnormal; Notable for the following:    pH, Ven 7.376 (*)    Bicarbonate 26.4 (*)    All other components within normal limits  CBC    Imaging Review Dg Chest Port 1 View  07/21/2015  CLINICAL DATA:  Shortness of breath, productive cough, onset of symptoms last night, no relief with inhaler and breathing treatments, history asthma and COPD EXAM: PORTABLE CHEST 1 VIEW COMPARISON:  Portable exam 2029 hours compared to 04/22/2015 FINDINGS: Normal heart size, mediastinal contours, and pulmonary vascularity. Lungs hyperinflated but clear.  No pulmonary infiltrate, pleural effusion or pneumothorax. Bones unremarkable. IMPRESSION: Hyperinflation compatible with history of COPD/asthma. No acute infiltrate. Electronically Signed   By: Ulyses Southward M.D.   On: 07/21/2015 21:00   I have personally reviewed and evaluated these images and lab results as part of my medical decision-making.   EKG Interpretation   Date/Time:  Friday July 21 2015 20:54:22 EDT Ventricular Rate:  120 PR Interval:  138 QRS Duration: 92 QT Interval:  296 QTC Calculation: 418 R Axis:   81 Text Interpretation:  Sinus tachycardia Probable left atrial enlargement  Consider RVH w/ secondary repol abnormality Nonspecific T abnormalities,  lateral leads No significant change since last tracing Confirmed by Chasten Blaze  MD, Reuel Boom ([id number]) on 07/21/2015 9:04:48 PM      MDM   Final diagnoses:  Chronic obstructive pulmonary disease with acute exacerbation (HCC)    61 yo F  with a chief complaint shortness of breath. Patient in respiratory distress on arrival and tachypnic into the upper 20s. Has significant diminished breath sounds. Will give Solu-Medrol, magnesium, 3 DuoNeb back to back.  The patient continues to have significant work of breathing and wheezing. CXR negative for pna. Now is able to talk in complete sentences. Started on continuous nebulizer. Admit to stepdown.  CRITICAL CARE Performed by: Rae Roam   Total critical care time: 40 minutes  Critical care time was exclusive of separately billable procedures and treating other patients.  Critical care was necessary to treat or prevent imminent or life-threatening deterioration.  Critical care was time spent personally by me on the following activities: development of treatment plan with patient and/or surrogate as well as nursing, discussions with consultants, evaluation of patient's response to treatment, examination of patient, obtaining history from patient or surrogate, ordering and performing treatments and interventions, ordering and review of laboratory studies, ordering and review of radiographic studies, pulse oximetry and re-evaluation of patient's condition. The patients results and plan were reviewed and discussed.   Any x-rays performed were independently reviewed by myself.   Differential diagnosis were considered with the presenting HPI.  Medications  albuterol (PROVENTIL,VENTOLIN) solution continuous neb (0 mg/hr Nebulization Stopped 07/21/15 2258)  albuterol (PROVENTIL) (2.5 MG/3ML) 0.083% nebulizer solution 5 mg (5 mg Nebulization Given 07/21/15 2027)  magnesium sulfate IVPB 2 g 50 mL (0 g Intravenous Stopped 07/21/15 2106)  methylPREDNISolone sodium succinate (SOLU-MEDROL) 125 mg/2 mL injection 125 mg (125 mg Intravenous Given 07/21/15 2047)  ipratropium-albuterol (DUONEB) 0.5-2.5 (3) MG/3ML nebulizer solution 3 mL (3 mLs Nebulization Given 07/21/15 2037)  ipratropium  (ATROVENT) nebulizer solution 1 mg (1 mg Nebulization Given 07/21/15 2037)    Filed Vitals:   07/21/15 1958 07/21/15 2101  BP: 147/80 113/65  Pulse: 137 120  Temp: 98.4 F (36.9 C)   TempSrc: Oral   Resp: 28 22  Height:  (1.6 m)   Weight: 140 lb (63.504 kg)   SpO2: 99% 100%    Final diagnoses:  Chronic obstructive pulmonary disease with acute exacerbation (HCC)    Admission/ observation were discussed with the admitting physician, patient and/or family and they are comfortable with the plan.     Melene Plan, DO 07/21/15 2317

## 2015-07-21 NOTE — ED Notes (Signed)
Pt complains of being short of breath ans a cough that started last night, she states that she used her inhaler and did 6 breathing treatments with no relief

## 2015-07-21 NOTE — ED Notes (Signed)
Pt also has a productive cough

## 2015-07-21 NOTE — H&P (Signed)
PCP:  Verlon Au, MD  [facility]  Referring provider Adela Lank   Chief Complaint: Dyspnea and wheezing  HPI: Sonia Drake is a 60 y.o. female   has a past medical history of COPD (chronic obstructive pulmonary disease) (HCC) and Asthma.   Presented with 24 hours of increased work of breathing and cough coughing up thick green sputum she attempted to use her inhaler as well as 6 nebulizer treatments but without any relief cough is productive. She went to her primary care doctor yesterday and had a shot of antibiotics and steroids with only minimal relief with continued to do worse throughout the day. Not endorsing any chest pain no fever or chills  IN ER: Chest x-ray showing hyperinflation but no infiltrate, white blood cell count 8.5 hemoglobin 13.2 potassium 3.4  initially heart rate 137. Respirations up to 28 patient continued good oxygenation 99% on room air She was given albuterol nebulizer as well as continuous nebulizer and Atrovent as well as 2 doses of doing them magnesium sulfide 2 g and Solu-Medrol 125. States she has improved since arrival and now able to talk.  VBG 7.37/46  Regarding pertinent past history: Patient has significant history of COPD/asthma was up admitted in January for influenza B. She's been followed by pulmonology. Reports 2-3 hospitalizations a years had to be intubated in 2005  Hospitalist was called for admission for asthma/COPD exacerbation  Review of Systems:    Pertinent positives include: shortness of breath at rest, dyspnea on exertion, productive cough,  Constitutional:  No weight loss, night sweats, Fevers, chills, fatigue, weight loss  HEENT:  No headaches, Difficulty swallowing,Tooth/dental problems,Sore throat,  No sneezing, itching, ear ache, nasal congestion, post nasal drip,  Cardio-vascular:  No chest pain, Orthopnea, PND, anasarca, dizziness, palpitations.no Bilateral lower extremity swelling  GI:  No heartburn,  indigestion, abdominal pain, nausea, vomiting, diarrhea, change in bowel habits, loss of appetite, melena, blood in stool, hematemesis Resp:     No excess mucus, no  No non-productive cough, No coughing up of blood.No change in color of mucus.No wheezing. Skin:  no rash or lesions. No jaundice GU:  no dysuria, change in color of urine, no urgency or frequency. No straining to urinate.  No flank pain.  Musculoskeletal:  No joint pain or no joint swelling. No decreased range of motion. No back pain.  Psych:  No change in mood or affect. No depression or anxiety. No memory loss.  Neuro: no localizing neurological complaints, no tingling, no weakness, no double vision, no gait abnormality, no slurred speech, no confusion  Otherwise ROS are negative except for above, 10 systems were reviewed  Past Medical History: Past Medical History  Diagnosis Date  . COPD (chronic obstructive pulmonary disease) (HCC)   . Asthma    History reviewed. No pertinent past surgical history.   Medications: Prior to Admission medications   Medication Sig Start Date End Date Taking? Authorizing Provider  albuterol (PROVENTIL HFA;VENTOLIN HFA) 108 (90 BASE) MCG/ACT inhaler Inhale into the lungs every 6 (six) hours as needed for wheezing or shortness of breath.   Yes Historical Provider, MD  AZELASTINE HCL NA Place 1 spray into the nose 2 (two) times daily.   Yes Historical Provider, MD  azithromycin (ZITHROMAX) 250 MG tablet Take 1 tablet (250 mg total) by mouth daily. For 3 more days 04/24/15  Yes Osvaldo Shipper, MD  budesonide-formoterol [facility]) 160-4.5 MCG/ACT inhaler Inhale 2 puffs into the lungs 2 (two) times daily.   Yes  Historical Provider, MD  cetirizine (ZYRTEC) 10 MG tablet Take 10 mg by mouth daily as needed for allergies.    Yes Historical Provider, MD  cholecalciferol (VITAMIN D) 1000 UNITS tablet Take 1,000 Units by mouth daily.   Yes Historical Provider, MD  ipratropium-albuterol (DUONEB)  0.5-2.5 (3) MG/3ML SOLN Take 3 mLs by nebulization every 4 (four) hours as needed. Patient taking differently: Take 3 mLs by nebulization every 4 (four) hours as needed (wheezing/shortness of breath).  07/07/14  Yes Jeralyn BennettEzequiel Zamora, MD  montelukast (SINGULAIR) 10 MG tablet Take 10 mg by mouth at bedtime.   Yes Historical Provider, MD  pantoprazole (PROTONIX) 20 MG tablet Take 20 mg by mouth daily as needed for indigestion.   Yes Historical Provider, MD  predniSONE (DELTASONE) 20 MG tablet Take 3 tablets once daily for 3 days, then take 2 tablets once daily for 3 days, then take 1 tablet once daily for 3 days, then STOP. 04/24/15  Yes Osvaldo ShipperGokul Krishnan, MD  theophylline (THEODUR) 300 MG 12 hr tablet Take 300 mg by mouth 2 (two) times daily.   Yes Historical Provider, MD  tiotropium (SPIRIVA) 18 MCG inhalation capsule Place 18 mcg into inhaler and inhale daily.   Yes Historical Provider, MD  triamcinolone lotion (KENALOG) 0.1 % APPLY TOPICALLY ONCE DAILY AS NEEDED FOR DRY SKIN 02/27/15  Yes Historical Provider, MD  naproxen (NAPROSYN) 500 MG tablet Take 1 tablet (500 mg total) by mouth 2 (two) times daily. Patient not taking: Reported on 07/21/2015 04/13/15   Dione Boozeavid Glick, MD  oseltamivir (TAMIFLU) 75 MG capsule Take 1 capsule (75 mg total) by mouth 2 (two) times daily. For 4 more days Patient not taking: Reported on 07/21/2015 04/24/15   Osvaldo ShipperGokul Krishnan, MD  oxyCODONE-acetaminophen (PERCOCET) 5-325 MG tablet Take 1 tablet by mouth every 4 (four) hours as needed for moderate pain. Patient not taking: Reported on 07/21/2015 04/13/15   Dione Boozeavid Glick, MD    Allergies:   Allergies  Allergen Reactions  . Contrast Media [Iodinated Diagnostic Agents]     Cardiac arrest  . Peanuts [Peanut Oil] Shortness Of Breath and Swelling  . Shellfish Allergy Shortness Of Breath and Swelling  . Soap     Laundry detergent-swelling/shortness of breath.  . Eggs Or Egg-Derived Products Rash    Social History:  Ambulatory   independently   Lives at home  With family     reports that she has never smoked. She does not have any smokeless tobacco history on file. She reports that she does not drink alcohol or use illicit drugs.     Family History: family history includes Asthma in her brother, father, and sister; Diabetes in her brother, father, and sister; Heart disease in her father and mother; Hypertension in her sister.    Physical Exam: Patient Vitals for the past 24 hrs:  BP Temp Temp src Pulse Resp SpO2 Height Weight  07/21/15 2101 113/65 mmHg - - 120 22 100 % - -  07/21/15 1958 147/80 mmHg 98.4 F (36.9 C) Oral (!) 137 (!) 28 99 % 5\' 3"  (1.6 m) 63.504 kg (140 lb)    1. General:  Able to speak but in short phrases.  2. Psychological: Alert and   Oriented 3. Head/ENT:    Dry Mucous Membranes                          Head Non traumatic, neck supple  Normal  Dentition 4. SKIN:  decreased Skin turgor,  Skin clean Dry and intact no rash 5. Heart: Rapid Regular rate and rhythm no Murmur, Rub or gallop 6. Lungs: significant wheezes no crackles   7. Abdomen: Soft, non-tender, Non distended 8. Lower extremities: no clubbing, cyanosis, or edema 9. Neurologically Grossly intact, moving all 4 extremities equally 10. MSK: Normal range of motion  body mass index is 24.81 kg/(m^2).   Labs on Admission:   Results for orders placed or performed during the hospital encounter of 07/21/15 (from the past 24 hour(s))  Basic metabolic panel     Status: Abnormal   Collection Time: 07/21/15  8:23 PM  Result Value Ref Range   Sodium 141 135 - 145 mmol/L   Potassium 3.4 (L) 3.5 - 5.1 mmol/L   Chloride 104 101 - 111 mmol/L   CO2 27 22 - 32 mmol/L   Glucose, Bld 174 (H) 65 - 99 mg/dL   BUN 8 6 - 20 mg/dL   Creatinine, Ser 1.61 0.44 - 1.00 mg/dL   Calcium 09.6 8.9 - 04.5 mg/dL   GFR calc non Af Amer >60 >60 mL/min   GFR calc Af Amer >60 >60 mL/min   Anion gap 10 5 - 15  CBC     Status:  None   Collection Time: 07/21/15  8:23 PM  Result Value Ref Range   WBC 8.5 4.0 - 10.5 K/uL   RBC 4.67 3.87 - 5.11 MIL/uL   Hemoglobin 13.2 12.0 - 15.0 g/dL   HCT 40.9 81.1 - 91.4 %   MCV 88.0 78.0 - 100.0 fL   MCH 28.3 26.0 - 34.0 pg   MCHC 32.1 30.0 - 36.0 g/dL   RDW 78.2 95.6 - 21.3 %   Platelets 264 150 - 400 K/uL  Blood gas, venous     Status: Abnormal   Collection Time: 07/21/15 10:35 PM  Result Value Ref Range   FIO2 0.21    Delivery systems MEDICAL AIR CONTINUOUS NEBULIZER    pH, Ven 7.376 (H) 7.250 - 7.300   pCO2, Ven 46.2 45.0 - 50.0 mmHg   pO2, Ven BELOW REPORTABLERANGE 31.0 - 45.0 mmHg   Bicarbonate 26.4 (H) 20.0 - 24.0 mEq/L   TCO2 24.2 0 - 100 mmol/L   Acid-Base Excess 1.3 0.0 - 2.0 mmol/L   O2 Saturation 44.4 %   Patient temperature 98.6    Collection site VEIN    Drawn by [id number]    Sample type VEIN     UA Not obtained  Lab Results  Component Value Date   HGBA1C 6.5* 07/04/2014    Estimated Creatinine Clearance: 54.1 mL/min (by C-G formula based on Cr of 0.98).  BNP (last 3 results) No results for input(s): PROBNP in the last 8760 hours.  Other results:  I have pearsonaly reviewed this: ECG REPORT  Rate:120  Rhythm: Sinus tachycardia ST&T Change: None specific ST segment changes some T-wave inversions QTC 418  Filed Weights   07/21/15 1958  Weight: 63.504 kg (140 lb)     Cultures:    Component Value Date/Time   SDES URINE, RANDOM 07/03/2014 1457   SPECREQUEST NONE 07/03/2014 1457   CULT  06/29/2014 0220    NO GROWTH 5 DAYS Performed at [facility]    REPTSTATUS 07/04/2014 FINAL 07/03/2014 1457     Radiological Exams on Admission: Dg Chest Port 1 View  07/21/2015  CLINICAL DATA:  Shortness of breath, productive cough, onset of symptoms last night, no  relief with inhaler and breathing treatments, history asthma and COPD EXAM: PORTABLE CHEST 1 VIEW COMPARISON:  Portable exam 2029 hours compared to 04/22/2015 FINDINGS: Normal  heart size, mediastinal contours, and pulmonary vascularity. Lungs hyperinflated but clear. No pulmonary infiltrate, pleural effusion or pneumothorax. Bones unremarkable. IMPRESSION: Hyperinflation compatible with history of COPD/asthma. No acute infiltrate. Electronically Signed   By: Ulyses Southward M.D.   On: 07/21/2015 21:00    Chart has been reviewed  Family not at  Bedside    Assessment/Plan  60 year old female history of COPD and asthma presents with exacerbation and evidence of acute respiratory distress secondary to asthma admit to step down due to increased work of breathing  Present on Admission:  . Asthma exacerbation/ COPD with acute exacerbation (HCC) - - Will initiate Steroid taper, antibiotics doxycyclin, Xopenex PRN, scheduled Atrovent, Dulera and Mucinex. Titrate O2 to saturation >90%. Follow patients respiratory status. Discussed case with pulmonology will see patient in consult tomorrow and monitor through elink atnight   . Hypokalemia likely secondary to albuterol use will replace and monitor   tachycardia will rehydrate and switched to Xopenex   Prophylaxis:   Lovenox   CODE STATUS:  FULL CODE    as per patient    Disposition:    To home once workup is complete and patient is stable  Other plan as per orders.  I have spent a total of 64 min on this admission   extra time was spent to discuss case with PCCM  Katelin Kutsch 07/21/2015, 11:05 PM    [facility]  Pager [phone]   after 2 AM please page floor coverage PA If 7AM-7PM, please contact the day team taking care of the patient  [email or web address]  Password [email or web address]

## 2015-07-22 DIAGNOSIS — J441 Chronic obstructive pulmonary disease with (acute) exacerbation: Principal | ICD-10-CM

## 2015-07-22 DIAGNOSIS — J45901 Unspecified asthma with (acute) exacerbation: Secondary | ICD-10-CM

## 2015-07-22 DIAGNOSIS — J9601 Acute respiratory failure with hypoxia: Secondary | ICD-10-CM

## 2015-07-22 LAB — COMPREHENSIVE METABOLIC PANEL
ALT: 21 U/L (ref 14–54)
AST: 32 U/L (ref 15–41)
Albumin: 4 g/dL (ref 3.5–5.0)
Alkaline Phosphatase: 78 U/L (ref 38–126)
Anion gap: 13 (ref 5–15)
BUN: 7 mg/dL (ref 6–20)
CO2: 21 mmol/L — ABNORMAL LOW (ref 22–32)
Calcium: 9.8 mg/dL (ref 8.9–10.3)
Chloride: 105 mmol/L (ref 101–111)
Creatinine, Ser: 0.84 mg/dL (ref 0.44–1.00)
GFR calc Af Amer: >60 mL/min (ref >60)
GFR calc non Af Amer: >60 mL/min (ref >60)
Glucose, Bld: 253 mg/dL — ABNORMAL HIGH (ref 65–99)
Potassium: 3.5 mmol/L (ref 3.5–5.1)
Sodium: 139 mmol/L (ref 135–145)
Total Bilirubin: 0.2 mg/dL — ABNORMAL LOW (ref 0.3–1.2)
Total Protein: 7.4 g/dL (ref 6.5–8.1)

## 2015-07-22 LAB — MRSA PCR SCREENING: MRSA by PCR: NEGATIVE

## 2015-07-22 LAB — CBC
HCT: 39.2 % (ref 36.0–46.0)
Hemoglobin: 12.5 g/dL (ref 12.0–15.0)
MCH: 28.7 pg (ref 26.0–34.0)
MCHC: 31.9 g/dL (ref 30.0–36.0)
MCV: 89.9 fL (ref 78.0–100.0)
Platelets: 257 10*3/uL (ref 150–400)
RBC: 4.36 MIL/uL (ref 3.87–5.11)
RDW: 15.3 % (ref 11.5–15.5)
WBC: 7.4 10*3/uL (ref 4.0–10.5)

## 2015-07-22 LAB — PHOSPHORUS: Phosphorus: 2.1 mg/dL — ABNORMAL LOW (ref 2.5–4.6)

## 2015-07-22 LAB — MAGNESIUM: Magnesium: 2 mg/dL (ref 1.7–2.4)

## 2015-07-22 LAB — TSH: TSH: 0.634 u[IU]/mL (ref 0.350–4.500)

## 2015-07-22 MED ORDER — OXYCODONE-ACETAMINOPHEN 5-325 MG PO TABS: 1.0000 | ORAL_TABLET | ORAL | Status: DC | PRN

## 2015-07-22 MED ORDER — TIOTROPIUM BROMIDE MONOHYDRATE 18 MCG IN CAPS
18.0000 ug | ORAL_CAPSULE | Freq: Every day | RESPIRATORY_TRACT | Status: DC
  Administered 2015-07-22 – 2015-07-24 (×3): 18 ug via RESPIRATORY_TRACT
  Filled 2015-07-22: qty 5

## 2015-07-22 MED ORDER — IPRATROPIUM-ALBUTEROL 0.5-2.5 (3) MG/3ML IN SOLN: 3.0000 mL | Freq: Four times a day (QID) | RESPIRATORY_TRACT | Status: DC

## 2015-07-22 NOTE — Progress Notes (Signed)
[facility] PROGRESS NOTE    Progress Note   Sonia Drake ZOX:[id number] DOB: 1955-05-04 DOA: 2015/07/23 PCP: Verlon Au, MD   Brief Narrative:   Sonia Drake is an 60 y.o. female with past medical history of COPD/asthma presents with an acute exacerbation and respiratory distress.  Assessment/Plan:   COPD with acute exacerbation (HCC)/Asthma exacerbation: Saturations have improved she has remained greater than 90%, we'll try off oxygen. Continue IV steroids antibiotics and inhalers. I am unsure of COPD or asthma, she has had these episodes and she was a little girl and had never improved. She relates they get worse during spring. Transfer to regular floor.  Hypokalemia Replete K orally likely due to inhalers.    DVT Prophylaxis - Lovenox ordered.  Family Communication: none Disposition Plan: transfer to med-surg Code Status:     Code Status Orders        Start     Ordered   2015/07/23 2318  Full code   Continuous     07/23/15 2317    Code Status History    Date Active Date Inactive Code Status Order ID Comments User Context   04/22/2015  1:56 PM 04/24/2015  2:22 PM Full Code [id number]  Maryruth Bun Rama, MD Inpatient   07/02/2014  4:33 PM 07/07/2014  3:31 PM Full Code [id number]  Conley Canal, MD Inpatient   06/29/2014 12:55 AM 06/30/2014  8:46 PM Full Code [id number]  Lorretta Harp, MD Inpatient        IV Access:    Peripheral IV   Procedures and diagnostic studies:   Dg Chest Port 1 View  07-23-2015  CLINICAL DATA:  Shortness of breath, productive cough, onset of symptoms last night, no relief with inhaler and breathing treatments, history asthma and COPD EXAM: PORTABLE CHEST 1 VIEW COMPARISON:  Portable exam 2029 hours compared to 04/22/2015 FINDINGS: Normal heart size, mediastinal contours, and pulmonary vascularity. Lungs hyperinflated but clear. No pulmonary infiltrate, pleural effusion or pneumothorax. Bones unremarkable. IMPRESSION:  Hyperinflation compatible with history of COPD/asthma. No acute infiltrate. Electronically Signed   By: Ulyses Southward M.D.   On: Jul 23, 2015 21:00     Medical Consultants:    None.  Anti-Infectives:   Anti-infectives    Start     Dose/Rate Route Frequency Ordered Stop   07/23/2015 2330  doxycycline (VIBRA-TABS) tablet 100 mg     100 mg Oral Every 12 hours 07/23/15 2317        Subjective:    [name] she relates her breathing is much improved compared to yesterday. His today she can speak in full sentences as she was his such respiratory distress.  Objective:    Filed Vitals:   07/22/15 0400 07/22/15 0425 07/22/15 0500 07/22/15 0600  BP:  130/51    Pulse: 101 101 101 97  Temp:      TempSrc:      Resp: Height:      Weight:      SpO2: 100% 99% 100% 99%    Intake/Output Summary (Last 24 hours) at 07/22/15 0714 Last data filed at 07/22/15 0600  Gross per 24 hour  Intake 760.42 ml  Output      0 ml  Net 760.42 ml   Filed Weights   07/23/15 1958 2015/07/23 2320  Weight: 63.504 kg (140 lb) 96.8 kg (213 lb 6.5 oz)    Exam: Gen:  NAD Cardiovascular:  midly tachy Chest and lungs:   She has  good air movement was still wheezing bilaterally. Abdomen:  Abdomen soft, NT/ND, + BS Extremities:  No edema   Data Reviewed:    Labs: Basic Metabolic Panel:  Recent Labs Lab 07/21/15 2023 07/22/15 0325  NA 141 139  K 3.4* 3.5  CL 104 105  CO2 27 21*  GLUCOSE 174* 253*  BUN 8 7  CREATININE 0.98 0.84  CALCIUM 10.3 9.8  MG  --  2.0  PHOS  --  2.1*   GFR Estimated Creatinine Clearance: 77.9 mL/min (by C-G formula based on Cr of 0.84). Liver Function Tests:  Recent Labs Lab 07/22/15 0325  AST 32  ALT 21  ALKPHOS 78  BILITOT 0.2*  PROT 7.4  ALBUMIN 4.0   No results for input(s): LIPASE, AMYLASE in the last 168 hours. No results for input(s): AMMONIA in the last 168 hours. Coagulation profile No results for input(s): INR, PROTIME in the last  168 hours.  CBC:  Recent Labs Lab 07/21/15 2023 07/22/15 0325  WBC 8.5 7.4  HGB 13.2 12.5  HCT 41.1 39.2  MCV 88.0 89.9  PLT 264 257   Cardiac Enzymes: No results for input(s): CKTOTAL, CKMB, CKMBINDEX, TROPONINI in the last 168 hours. BNP (last 3 results) No results for input(s): PROBNP in the last 8760 hours. CBG: No results for input(s): GLUCAP in the last 168 hours. D-Dimer: No results for input(s): DDIMER in the last 72 hours. Hgb A1c: No results for input(s): HGBA1C in the last 72 hours. Lipid Profile: No results for input(s): CHOL, HDL, LDLCALC, TRIG, CHOLHDL, LDLDIRECT in the last 72 hours. Thyroid function studies:  Recent Labs  07/22/15 0325  TSH 0.634   Anemia work up: No results for input(s): VITAMINB12, FOLATE, FERRITIN, TIBC, IRON, RETICCTPCT in the last 72 hours. Sepsis Labs:  Recent Labs Lab 07/21/15 2023 07/22/15 0325  WBC 8.5 7.4   Microbiology Recent Results (from the past 240 hour(s))  MRSA PCR Screening     Status: None   Collection Time: 07/21/15 11:38 PM  Result Value Ref Range Status   MRSA by PCR NEGATIVE NEGATIVE Final    Comment:        The GeneXpert MRSA Assay (FDA approved for NASAL specimens only), is one component of a comprehensive MRSA colonization surveillance program. It is not intended to diagnose MRSA infection nor to guide or monitor treatment for MRSA infections.      Medications:   . doxycycline  100 mg Oral Q12H  . enoxaparin (LOVENOX) injection  40 mg Subcutaneous QHS  . guaiFENesin  600 mg Oral BID  . ipratropium-albuterol  3 mL Nebulization Q4H  . loratadine  10 mg Oral Daily  . methylPREDNISolone (SOLU-MEDROL) injection  60 mg Intravenous Q12H  . mometasone-formoterol  2 puff Inhalation BID  . montelukast  10 mg Oral QHS  . sodium chloride flush  3 mL Intravenous Q12H   Continuous Infusions: . sodium chloride 125 mL/hr at 07/21/15 2355    Time spent: 25 min   LOS: 1 day   [name],  Arrie Zuercher  [facility] Pager [phone]  *Please refer to [email or web address], password [email or web address] to get updated schedule on who will round on this patient, as hospitalists switch teams weekly. If 7PM-7AM, please contact night-coverage at [email or web address], password [email or web address] for any overnight needs.  07/22/2015, 7:14 AM

## 2015-07-22 NOTE — Progress Notes (Signed)
[facility]. Erick Blinksuchman, Mirenda Baltazar D, RN

## 2015-07-23 DIAGNOSIS — E876 Hypokalemia: Secondary | ICD-10-CM

## 2015-07-23 MED ORDER — PREDNISONE 20 MG PO TABS
60.0000 mg | ORAL_TABLET | Freq: Every day | ORAL | Status: DC
  Administered 2015-07-24: 60 mg via ORAL
  Filled 2015-07-23: qty 3

## 2015-07-23 MED ORDER — ALBUTEROL SULFATE (2.5 MG/3ML) 0.083% IN NEBU
2.5000 mg | INHALATION_SOLUTION | Freq: Four times a day (QID) | RESPIRATORY_TRACT | Status: DC
  Administered 2015-07-23 – 2015-07-24 (×4): 2.5 mg via RESPIRATORY_TRACT
  Filled 2015-07-23 (×5): qty 3

## 2015-07-23 NOTE — Progress Notes (Addendum)
[facility] PROGRESS NOTE    Progress Note   Sonia Drake ZOX:[id number] DOB: 1955-05-10 DOA: 07/31/15 PCP: Verlon Au, MD   Brief Narrative:   Sonia Drake is an 60 y.o. female with past medical history of COPD/asthma presents with an acute exacerbation and respiratory distress.  Assessment/Plan:   COPD with acute exacerbation (HCC)/Asthma exacerbation: Saturations has improved she has remained greater than 90%. Change steroids to orals. I am unsure of COPD or asthma, she has had these episodes and she was a little girl and had never improved. Will need PFTs as an outpatient.  Hypokalemia: Replete K orally likely due to inhalers.    DVT Prophylaxis - Lovenox ordered.  Family Communication: none Disposition Plan: home in am Code Status:     Code Status Orders        Start     Ordered   07/31/15 2318  Full code   Continuous     2015/07/31 2317    Code Status History    Date Active Date Inactive Code Status Order ID Comments User Context   04/22/2015  1:56 PM 04/24/2015  2:22 PM Full Code [id number]  Maryruth Bun Rama, MD Inpatient   07/02/2014  4:33 PM 07/07/2014  3:31 PM Full Code [id number]  Conley Canal, MD Inpatient   06/29/2014 12:55 AM 06/30/2014  8:46 PM Full Code [id number]  Lorretta Harp, MD Inpatient        IV Access:    Peripheral IV   Procedures and diagnostic studies:   Dg Chest Port 1 View  07/31/15  CLINICAL DATA:  Shortness of breath, productive cough, onset of symptoms last night, no relief with inhaler and breathing treatments, history asthma and COPD EXAM: PORTABLE CHEST 1 VIEW COMPARISON:  Portable exam 2029 hours compared to 04/22/2015 FINDINGS: Normal heart size, mediastinal contours, and pulmonary vascularity. Lungs hyperinflated but clear. No pulmonary infiltrate, pleural effusion or pneumothorax. Bones unremarkable. IMPRESSION: Hyperinflation compatible with history of COPD/asthma. No acute infiltrate. Electronically Signed    By: Ulyses Southward M.D.   On: 07/31/15 21:00     Medical Consultants:    None.  Anti-Infectives:   Anti-infectives    Start     Dose/Rate Route Frequency Ordered Stop   07-31-15 2330  doxycycline (VIBRA-TABS) tablet 100 mg     100 mg Oral Every 12 hours 07/31/2015 2317        Subjective:    Sonia Drake she relates her breathing is much improved compared to yesterday.  Objective:    Filed Vitals:   07/22/15 2006 07/22/15 2045 07/23/15 0552 07/23/15 0809  BP:  115/51 111/69   Pulse:  94 93 93  Temp:  97.5 F (36.4 C) 97.7 F (36.5 C)   TempSrc:  Oral Oral   Resp:  Height:      Weight:      SpO2: 95% 96% 97% 99%    Intake/Output Summary (Last 24 hours) at 07/23/15 1100 Last data filed at 07/22/15 1739  Gross per 24 hour  Intake    100 ml  Output      0 ml  Net    100 ml   Filed Weights   31-Jul-2015 1958 07/31/15 2320  Weight: 63.504 kg (140 lb) 96.8 kg (213 lb 6.5 oz)    Exam: Gen:  NAD Cardiovascular:  midly tachy Chest and lungs:   She has good air movement was still wheezing bilaterally. Abdomen:  Abdomen soft, NT/ND, + BS  Extremities:  No edema   Data Reviewed:    Labs: Basic Metabolic Panel:  Recent Labs Lab 07/21/15 2023 07/22/15 0325  NA 141 139  K 3.4* 3.5  CL 104 105  CO2 27 21*  GLUCOSE 174* 253*  BUN 8 7  CREATININE 0.98 0.84  CALCIUM 10.3 9.8  MG  --  2.0  PHOS  --  2.1*   GFR Estimated Creatinine Clearance: 77.9 mL/min (by C-G formula based on Cr of 0.84). Liver Function Tests:  Recent Labs Lab 07/22/15 0325  AST 32  ALT 21  ALKPHOS 78  BILITOT 0.2*  PROT 7.4  ALBUMIN 4.0   No results for input(s): LIPASE, AMYLASE in the last 168 hours. No results for input(s): AMMONIA in the last 168 hours. Coagulation profile No results for input(s): INR, PROTIME in the last 168 hours.  CBC:  Recent Labs Lab 07/21/15 2023 07/22/15 0325  WBC 8.5 7.4  HGB 13.2 12.5  HCT 41.1 39.2  MCV 88.0 89.9  PLT 264 257    Cardiac Enzymes: No results for input(s): CKTOTAL, CKMB, CKMBINDEX, TROPONINI in the last 168 hours. BNP (last 3 results) No results for input(s): PROBNP in the last 8760 hours. CBG: No results for input(s): GLUCAP in the last 168 hours. D-Dimer: No results for input(s): DDIMER in the last 72 hours. Hgb A1c: No results for input(s): HGBA1C in the last 72 hours. Lipid Profile: No results for input(s): CHOL, HDL, LDLCALC, TRIG, CHOLHDL, LDLDIRECT in the last 72 hours. Thyroid function studies:  Recent Labs  07/22/15 0325  TSH 0.634   Anemia work up: No results for input(s): VITAMINB12, FOLATE, FERRITIN, TIBC, IRON, RETICCTPCT in the last 72 hours. Sepsis Labs:  Recent Labs Lab 07/21/15 2023 07/22/15 0325  WBC 8.5 7.4   Microbiology Recent Results (from the past 240 hour(s))  MRSA PCR Screening     Status: None   Collection Time: 07/21/15 11:38 PM  Result Value Ref Range Status   MRSA by PCR NEGATIVE NEGATIVE Final    Comment:        The GeneXpert MRSA Assay (FDA approved for NASAL specimens only), is one component of a comprehensive MRSA colonization surveillance program. It is not intended to diagnose MRSA infection nor to guide or monitor treatment for MRSA infections.      Medications:   . albuterol  2.5 mg Nebulization QID  . doxycycline  100 mg Oral Q12H  . enoxaparin (LOVENOX) injection  40 mg Subcutaneous QHS  . guaiFENesin  600 mg Oral BID  . loratadine  10 mg Oral Daily  . methylPREDNISolone (SOLU-MEDROL) injection  60 mg Intravenous Q12H  . mometasone-formoterol  2 puff Inhalation BID  . montelukast  10 mg Oral QHS  . sodium chloride flush  3 mL Intravenous Q12H  . tiotropium  18 mcg Inhalation Daily   Continuous Infusions:    Time spent: 15 min   LOS: 2 days   Sonia Drake, Sonia Drake  [facility] Pager [phone]  *Please refer to [email or web address], password [email or web address] to get updated schedule on who will round on this patient, as hospitalists  switch teams weekly. If 7PM-7AM, please contact night-coverage at [email or web address], password [email or web address] for any overnight needs.  07/23/2015, 11:00 AM

## 2015-07-24 MED ORDER — PREDNISONE 10 MG PO TABS: ORAL_TABLET | ORAL | Status: DC

## 2015-07-24 MED ORDER — ALBUTEROL SULFATE (2.5 MG/3ML) 0.083% IN NEBU: 2.5000 mg | INHALATION_SOLUTION | Freq: Three times a day (TID) | RESPIRATORY_TRACT | Status: DC

## 2015-07-24 MED ORDER — DOXYCYCLINE HYCLATE 100 MG PO TABS: 100.0000 mg | ORAL_TABLET | Freq: Two times a day (BID) | ORAL | Status: DC

## 2015-07-24 NOTE — Progress Notes (Signed)
Patient discharged home, all discharge medications and instructions reviewed and questions answered.  Patient to be assisted to vehicle when ride arrives 

## 2015-07-24 NOTE — Discharge Summary (Signed)
Physician Discharge Summary  Sonia Drake:[id number] DOB: 06/28/1955 DOA: 07/21/2015  PCP: Verlon Au, MD  Admit date: 07/21/2015 Discharge date: 07/24/2015  Time spent:35 minutes  Recommendations for Outpatient Follow-up:  1. Follow-up care doctor in 2 weeks.   Discharge Diagnoses:  Active Problems:   COPD with acute exacerbation (HCC)   Asthma exacerbation   Hypokalemia   COPD exacerbation (HCC)   Discharge Condition: stable  Diet recommendation: regular  Filed Weights   07/21/15 1958 07/21/15 2320  Weight: 63.504 kg (140 lb) 96.8 kg (213 lb 6.5 oz)    History of present illness:  *History 60-year-old with past medical history of asthma that comes in for work of breathing and productive cough with greenish sputum last 24 hours prior to admission.  Hospital Course:  Acute asthma exacerbation/COPD exacerbation: She was admitted to regular floor start her on IV steroids and antibiotics after 2 days of IV empiric treatment respiration improve. She will go home on oral steroids and antibiotics. She'll follow up with his primary care doctor and she will need PFTs as an outpatient.  Hyperkalemia: Repleted orally due to inhalers resolved.   Procedures:  CXR  Consultations:  none  Discharge Exam: Filed Vitals:   07/23/15 2000 07/24/15 0426  BP: 130/70 126/57  Pulse: 109 87  Temp: 98.3 F (36.8 C) 97.7 F (36.5 C)  Resp: 16 16    General: A&O x3 Cardiovascular: RRR Respiratory: good air movement CTA B/L  Discharge Instructions   Discharge Instructions    Diet - low sodium heart healthy    Complete by:  As directed      Increase activity slowly    Complete by:  As directed           Current Discharge Medication List    START taking these medications   Details  doxycycline (VIBRA-TABS) 100 MG tablet Take 1 tablet (100 mg total) by mouth every 12 (twelve) hours. Qty: 10 tablet, Refills: 0    !! predniSONE (DELTASONE) 10 MG tablet  Takes 6 tablets for 1 days, then 5 tablets for 1 days, then 4 tablets for 1 days, then 3 tablets for 1 days, then 2 tabs for 1 days, then 1 tab for 1 days, and then stop. Qty: 21 tablet, Refills: 0     !! - Potential duplicate medications found. Please discuss with provider.    CONTINUE these medications which have NOT CHANGED   Details  albuterol (PROVENTIL HFA;VENTOLIN HFA) 108 (90 BASE) MCG/ACT inhaler Inhale into the lungs every 6 (six) hours as needed for wheezing or shortness of breath.    AZELASTINE HCL NA Place 1 spray into the nose 2 (two) times daily.    budesonide-formoterol (SYMBICORT) 160-4.5 MCG/ACT inhaler Inhale 2 puffs into the lungs 2 (two) times daily.    cetirizine (ZYRTEC) 10 MG tablet Take 10 mg by mouth daily as needed for allergies.     cholecalciferol (VITAMIN D) 1000 UNITS tablet Take 1,000 Units by mouth daily.    ipratropium-albuterol (DUONEB) 0.5-2.5 (3) MG/3ML SOLN Take 3 mLs by nebulization every 4 (four) hours as needed. Qty: 360 mL, Refills: 1    montelukast (SINGULAIR) 10 MG tablet Take 10 mg by mouth at bedtime.    pantoprazole (PROTONIX) 20 MG tablet Take 20 mg by mouth daily as needed for indigestion.    !! predniSONE (DELTASONE) 20 MG tablet Take 3 tablets once daily for 3 days, then take 2 tablets once daily for 3 days, then take  1 tablet once daily for 3 days, then STOP. Qty: 18 tablet, Refills: 0    theophylline (THEODUR) 300 MG 12 hr tablet Take 300 mg by mouth 2 (two) times daily.    tiotropium (SPIRIVA) 18 MCG inhalation capsule Place 18 mcg into inhaler and inhale daily.    naproxen (NAPROSYN) 500 MG tablet Take 1 tablet (500 mg total) by mouth 2 (two) times daily. Qty: 30 tablet, Refills: 0    oseltamivir (TAMIFLU) 75 MG capsule Take 1 capsule (75 mg total) by mouth 2 (two) times daily. For 4 more days Qty: 8 capsule, Refills: 0    oxyCODONE-acetaminophen (PERCOCET) 5-325 MG tablet Take 1 tablet by mouth every 4 (four) hours as needed  for moderate pain. Qty: 15 tablet, Refills: 0     !! - Potential duplicate medications found. Please discuss with provider.    STOP taking these medications     azithromycin (ZITHROMAX) 250 MG tablet        Allergies  Allergen Reactions  . Contrast Media [Iodinated Diagnostic Agents]     Cardiac arrest  . Peanuts [Peanut Oil] Shortness Of Breath and Swelling  . Shellfish Allergy Shortness Of Breath and Swelling  . Soap     Laundry detergent-swelling/shortness of breath.  . Eggs Or Egg-Derived Products Rash      The results of significant diagnostics from this hospitalization (including imaging, microbiology, ancillary and laboratory) are listed below for reference.    Significant Diagnostic Studies: Dg Chest Port 1 View  07/21/2015  CLINICAL DATA:  Shortness of breath, productive cough, onset of symptoms last night, no relief with inhaler and breathing treatments, history asthma and COPD EXAM: PORTABLE CHEST 1 VIEW COMPARISON:  Portable exam 2029 hours compared to 04/22/2015 FINDINGS: Normal heart size, mediastinal contours, and pulmonary vascularity. Lungs hyperinflated but clear. No pulmonary infiltrate, pleural effusion or pneumothorax. Bones unremarkable. IMPRESSION: Hyperinflation compatible with history of COPD/asthma. No acute infiltrate. Electronically Signed   By: Ulyses Southward M.D.   On: 07/21/2015 21:00    Microbiology: Recent Results (from the past 240 hour(s))  MRSA PCR Screening     Status: None   Collection Time: 07/21/15 11:38 PM  Result Value Ref Range Status   MRSA by PCR NEGATIVE NEGATIVE Final    Comment:        The GeneXpert MRSA Assay (FDA approved for NASAL specimens only), is one component of a comprehensive MRSA colonization surveillance program. It is not intended to diagnose MRSA infection nor to guide or monitor treatment for MRSA infections.      Labs: Basic Metabolic Panel:  Recent Labs Lab 07/21/15 2023 07/22/15 0325  NA 141 139   K 3.4* 3.5  CL 104 105  CO2 27 21*  GLUCOSE 174* 253*  BUN 8 7  CREATININE 0.98 0.84  CALCIUM 10.3 9.8  MG  --  2.0  PHOS  --  2.1*   Liver Function Tests:  Recent Labs Lab 07/22/15 0325  AST 32  ALT 21  ALKPHOS 78  BILITOT 0.2*  PROT 7.4  ALBUMIN 4.0   No results for input(s): LIPASE, AMYLASE in the last 168 hours. No results for input(s): AMMONIA in the last 168 hours. CBC:  Recent Labs Lab 07/21/15 2023 07/22/15 0325  WBC 8.5 7.4  HGB 13.2 12.5  HCT 41.1 39.2  MCV 88.0 89.9  PLT 264 257   Cardiac Enzymes: No results for input(s): CKTOTAL, CKMB, CKMBINDEX, TROPONINI in the last 168 hours. BNP: BNP (last 3 results)  No results for input(s): BNP in the last 8760 hours.  ProBNP (last 3 results) No results for input(s): PROBNP in the last 8760 hours.  CBG: No results for input(s): GLUCAP in the last 168 hours.   Signed:  [name], ABRAHAM MD.  [facility] 07/24/2015, 12:20 PM

## 2016-02-04 ENCOUNTER — Observation Stay (HOSPITAL_COMMUNITY)
Admission: EM | Admit: 2016-02-04 | Discharge: 2016-02-05 | Disposition: A | Discharge status: Home / Self Care | Payer: Medicaid Other | Attending: Nephrology | Admitting: Nephrology

## 2016-02-04 ENCOUNTER — Emergency Department (HOSPITAL_COMMUNITY): Payer: Medicaid Other

## 2016-02-04 ENCOUNTER — Encounter (HOSPITAL_COMMUNITY): Payer: Self-pay | Admitting: Nurse Practitioner

## 2016-02-04 DIAGNOSIS — R0603 Acute respiratory distress: Secondary | ICD-10-CM

## 2016-02-04 DIAGNOSIS — E876 Hypokalemia: Secondary | ICD-10-CM

## 2016-02-04 DIAGNOSIS — Z79899 Other long term (current) drug therapy: Secondary | ICD-10-CM | POA: Insufficient documentation

## 2016-02-04 DIAGNOSIS — J45901 Unspecified asthma with (acute) exacerbation: Secondary | ICD-10-CM | POA: Diagnosis not present

## 2016-02-04 DIAGNOSIS — R911 Solitary pulmonary nodule: Secondary | ICD-10-CM | POA: Insufficient documentation

## 2016-02-04 DIAGNOSIS — J449 Chronic obstructive pulmonary disease, unspecified: Secondary | ICD-10-CM | POA: Diagnosis present

## 2016-02-04 DIAGNOSIS — J441 Chronic obstructive pulmonary disease with (acute) exacerbation: Secondary | ICD-10-CM | POA: Diagnosis not present

## 2016-02-04 DIAGNOSIS — J45909 Unspecified asthma, uncomplicated: Secondary | ICD-10-CM | POA: Diagnosis present

## 2016-02-04 LAB — RESPIRATORY PANEL BY PCR

## 2016-02-04 LAB — CBC WITH DIFFERENTIAL/PLATELET
Basophils Absolute: 0 10*3/uL (ref 0.0–0.1)
Basophils Relative: 0 %
Eosinophils Absolute: 0.9 10*3/uL — ABNORMAL HIGH (ref 0.0–0.7)
Eosinophils Relative: 8 %
HCT: 37.7 % (ref 36.0–46.0)
Hemoglobin: 12.3 g/dL (ref 12.0–15.0)
Lymphocytes Relative: 40 %
Lymphs Abs: 4.4 10*3/uL — ABNORMAL HIGH (ref 0.7–4.0)
MCH: 28.9 pg (ref 26.0–34.0)
MCHC: 32.6 g/dL (ref 30.0–36.0)
MCV: 88.7 fL (ref 78.0–100.0)
Monocytes Absolute: 1.3 10*3/uL — ABNORMAL HIGH (ref 0.1–1.0)
Monocytes Relative: 12 %
Neutro Abs: 4.4 10*3/uL (ref 1.7–7.7)
Neutrophils Relative %: 40 %
Platelets: 261 10*3/uL (ref 150–400)
RBC: 4.25 MIL/uL (ref 3.87–5.11)
RDW: 14.2 % (ref 11.5–15.5)
WBC: 11 10*3/uL — ABNORMAL HIGH (ref 4.0–10.5)

## 2016-02-04 LAB — BASIC METABOLIC PANEL
Anion gap: 7 (ref 5–15)
BUN: 7 mg/dL (ref 6–20)
CO2: 31 mmol/L (ref 22–32)
Calcium: 10.3 mg/dL (ref 8.9–10.3)
Chloride: 101 mmol/L (ref 101–111)
Creatinine, Ser: 0.95 mg/dL (ref 0.44–1.00)
GFR calc Af Amer: >60 mL/min (ref >60)
GFR calc non Af Amer: >60 mL/min (ref >60)
Glucose, Bld: 132 mg/dL — ABNORMAL HIGH (ref 65–99)
Potassium: 3 mmol/L — ABNORMAL LOW (ref 3.5–5.1)
Sodium: 139 mmol/L (ref 135–145)

## 2016-02-04 MED ORDER — ALBUTEROL SULFATE (2.5 MG/3ML) 0.083% IN NEBU
5.0000 mg | INHALATION_SOLUTION | Freq: Once | RESPIRATORY_TRACT | Status: AC
  Administered 2016-02-04: 5 mg via RESPIRATORY_TRACT
  Filled 2016-02-04: qty 6

## 2016-02-04 MED ORDER — IPRATROPIUM BROMIDE 0.02 % IN SOLN
0.5000 mg | Freq: Once | RESPIRATORY_TRACT | Status: AC
  Administered 2016-02-04: 0.5 mg via RESPIRATORY_TRACT
  Filled 2016-02-04: qty 2.5

## 2016-02-04 MED ORDER — ALBUTEROL SULFATE (2.5 MG/3ML) 0.083% IN NEBU: 2.5000 mg | INHALATION_SOLUTION | RESPIRATORY_TRACT | Status: DC | PRN

## 2016-02-04 MED ORDER — MONTELUKAST SODIUM 10 MG PO TABS
10.0000 mg | ORAL_TABLET | Freq: Every day | ORAL | Status: DC
  Administered 2016-02-04: 10 mg via ORAL
  Filled 2016-02-04: qty 1

## 2016-02-04 MED ORDER — VITAMIN D3 25 MCG (1000 UNIT) PO TABS
1000.0000 [IU] | ORAL_TABLET | Freq: Every day | ORAL | Status: DC
  Administered 2016-02-04 – 2016-02-05 (×2): 1000 [IU] via ORAL
  Filled 2016-02-04 (×2): qty 1

## 2016-02-04 MED ORDER — DOXYCYCLINE HYCLATE 100 MG PO TABS
100.0000 mg | ORAL_TABLET | Freq: Two times a day (BID) | ORAL | Status: DC
  Administered 2016-02-04 – 2016-02-05 (×3): 100 mg via ORAL
  Filled 2016-02-04 (×3): qty 1

## 2016-02-04 MED ORDER — METHYLPREDNISOLONE SODIUM SUCC 125 MG IJ SOLR
80.0000 mg | Freq: Three times a day (TID) | INTRAMUSCULAR | Status: DC
  Administered 2016-02-04 – 2016-02-05 (×3): 80 mg via INTRAVENOUS
  Filled 2016-02-04 (×5): qty 2

## 2016-02-04 MED ORDER — ACETAMINOPHEN 650 MG RE SUPP: 650.0000 mg | Freq: Four times a day (QID) | RECTAL | Status: DC | PRN

## 2016-02-04 MED ORDER — PANTOPRAZOLE SODIUM 40 MG PO TBEC: 40.0000 mg | DELAYED_RELEASE_TABLET | Freq: Every day | ORAL | Status: DC | PRN

## 2016-02-04 MED ORDER — ALBUTEROL (5 MG/ML) CONTINUOUS INHALATION SOLN
10.0000 mg/h | INHALATION_SOLUTION | RESPIRATORY_TRACT | Status: AC
  Administered 2016-02-04: 10 mg/h via RESPIRATORY_TRACT
  Filled 2016-02-04: qty 20

## 2016-02-04 MED ORDER — MAGNESIUM SULFATE 2 GM/50ML IV SOLN
2.0000 g | Freq: Once | INTRAVENOUS | Status: AC
  Administered 2016-02-04: 2 g via INTRAVENOUS
  Filled 2016-02-04: qty 50

## 2016-02-04 MED ORDER — MOMETASONE FURO-FORMOTEROL FUM 200-5 MCG/ACT IN AERO
2.0000 | INHALATION_SPRAY | Freq: Two times a day (BID) | RESPIRATORY_TRACT | Status: DC
  Administered 2016-02-04 – 2016-02-05 (×3): 2 via RESPIRATORY_TRACT
  Filled 2016-02-04: qty 8.8

## 2016-02-04 MED ORDER — ALBUTEROL SULFATE (2.5 MG/3ML) 0.083% IN NEBU
2.5000 mg | INHALATION_SOLUTION | Freq: Four times a day (QID) | RESPIRATORY_TRACT | Status: DC
  Administered 2016-02-04 – 2016-02-05 (×5): 2.5 mg via RESPIRATORY_TRACT
  Filled 2016-02-04 (×5): qty 3

## 2016-02-04 MED ORDER — TIOTROPIUM BROMIDE MONOHYDRATE 18 MCG IN CAPS
18.0000 ug | ORAL_CAPSULE | Freq: Every day | RESPIRATORY_TRACT | Status: DC
  Administered 2016-02-04 – 2016-02-05 (×2): 18 ug via RESPIRATORY_TRACT
  Filled 2016-02-04: qty 5

## 2016-02-04 MED ORDER — IPRATROPIUM BROMIDE 0.02 % IN SOLN: 0.5000 mg | Freq: Four times a day (QID) | RESPIRATORY_TRACT | Status: DC

## 2016-02-04 MED ORDER — ACETAMINOPHEN 325 MG PO TABS: 650.0000 mg | ORAL_TABLET | Freq: Four times a day (QID) | ORAL | Status: DC | PRN

## 2016-02-04 MED ORDER — HEPARIN SODIUM (PORCINE) 5000 UNIT/ML IJ SOLN
5000.0000 [IU] | Freq: Three times a day (TID) | INTRAMUSCULAR | Status: DC
  Administered 2016-02-04: 5000 [IU] via SUBCUTANEOUS
  Filled 2016-02-04 (×4): qty 1

## 2016-02-04 MED ORDER — LORATADINE 10 MG PO TABS
10.0000 mg | ORAL_TABLET | Freq: Every day | ORAL | Status: DC
  Administered 2016-02-04 – 2016-02-05 (×2): 10 mg via ORAL
  Filled 2016-02-04 (×2): qty 1

## 2016-02-04 MED ORDER — THEOPHYLLINE ER 300 MG PO TB12
300.0000 mg | ORAL_TABLET | Freq: Two times a day (BID) | ORAL | Status: DC
Start: 2016-02-04 — End: 2016-02-04

## 2016-02-04 MED ORDER — METHYLPREDNISOLONE SODIUM SUCC 125 MG IJ SOLR
125.0000 mg | Freq: Once | INTRAMUSCULAR | Status: AC
  Administered 2016-02-04: 125 mg via INTRAVENOUS
  Filled 2016-02-04: qty 2

## 2016-02-04 NOTE — ED Notes (Signed)
No respiratory or acute distress noted wheezing noted call light in reach no reaction to medication noted resting in bed with eyes closed.

## 2016-02-04 NOTE — ED Notes (Signed)
No respiratory or acute distress noted alert and oriented x 3 audable wheezing noted no reaction to medication noted visitors at bedside call light in reach.

## 2016-02-04 NOTE — ED Triage Notes (Signed)
Pt states she is having severe difficulty breathing. Exhibiting mild work of breathing and orthopnea.States her rescue inhale has not helped.

## 2016-02-04 NOTE — ED Provider Notes (Signed)
[facility]-EMERGENCY DEPT Provider Note   CSN: [id number] Arrival date & time: 02/04/16  0020  By signing my name below, I, Nelwyn Salisbury, attest that this documentation has been prepared under the direction and in the presence of non-physician practitioner, Danelle Berry, PA-C. Electronically Signed: Nelwyn Salisbury, Scribe. 02/04/2016. 1:06 AM.  History   Chief Complaint Chief Complaint  Patient presents with  . Shortness of Breath    Asthma Excerberation   HPI  HPI Comments:  Sonia Drake is a 60 y.o. female with PMHx of COPD who presents to the Emergency Department complaining of sudden-onset constant Respiratory distress with wheeze for the past 5 hours about 5 hours ago. She has no improvement with 5 back-to-back nebulizer treatments and use of rescue inhaler. She has a worsening productive cough with yellow-green sputum, she has difficulty inability because of exertional dyspnea, she also has associated chest tightness.  Her shortness of breath is about that she can barely speak short sentences. She has a history of COPD and asthma and has had many admissions in the past for respiratory distress has been intubated in the past and has been on BiPAP.  She denies any sweats, chills, fever, nausea or vomiting however she has had some posttussive gagging without emesis. Patient denies URI symptoms but her grandson has been sick with cold-like symptoms.  No other acute or associated sx.  She denies chest pressure, LE edema, palpitations, near syncope.  Past Medical History:  Diagnosis Date  . Asthma   . COPD (chronic obstructive pulmonary disease) [facility])     Patient Active Problem List   Diagnosis Date Noted  . COPD exacerbation (HCC) 07/21/2015  . Influenza B 04/23/2015  . Asthma exacerbation 04/22/2015  . Hypokalemia 04/22/2015  . 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 Influenza with pneumonia 07/03/2014  . Multiple lung nodules on CT 07/03/2014  . Asthma 07/02/2014  . COPD with acute  exacerbation (HCC) 07/02/2014  . Hyperglycemia 07/02/2014  . Chronic obstructive pulmonary disease with acute exacerbation (HCC)   . Prolonged QT interval 06/29/2014    History reviewed. No pertinent surgical history.  OB History    No data available       Home Medications    Prior to Admission medications   Medication Sig Start Date End Date Taking? Authorizing Provider  albuterol (PROVENTIL HFA;VENTOLIN HFA) 108 (90 BASE) MCG/ACT inhaler Inhale into the lungs every 6 (six) hours as needed for wheezing or shortness of breath.   Yes Historical Provider, MD  AZELASTINE HCL NA Place 1 spray into the nose 2 (two) times daily.   Yes Historical Provider, MD  budesonide-formoterol (SYMBICORT) 160-4.5 MCG/ACT inhaler Inhale 2 puffs into the lungs 2 (two) times daily.   Yes Historical Provider, MD  cetirizine (ZYRTEC) 10 MG tablet Take 10 mg by mouth daily as needed for allergies.    Yes Historical Provider, MD  cholecalciferol (VITAMIN D) 1000 UNITS tablet Take 1,000 Units by mouth daily.   Yes Historical Provider, MD  ibuprofen (ADVIL,MOTRIN) 200 MG tablet Take 400 mg by mouth 2 (two) times daily as needed for headache.   Yes Historical Provider, MD  ipratropium-albuterol (DUONEB) 0.5-2.5 (3) MG/3ML SOLN Take 3 mLs by nebulization every 4 (four) hours as needed. Patient taking differently: Take 3 mLs by nebulization every 4 (four) hours as needed (wheezing/shortness of breath).  07/07/14  Yes Jeralyn Bennett, MD  montelukast (SINGULAIR) 10 MG tablet Take 10 mg by mouth at bedtime.   Yes Historical Provider, MD  pantoprazole (PROTONIX)  20 MG tablet Take 20 mg by mouth daily as needed for indigestion.   Yes Historical Provider, MD  theophylline (THEODUR) 300 MG 12 hr tablet Take 300 mg by mouth 2 (two) times daily.   Yes Historical Provider, MD  tiotropium (SPIRIVA) 18 MCG inhalation capsule Place 18 mcg into inhaler and inhale daily.   Yes Historical Provider, MD  triamcinolone lotion (KENALOG)  0.1 % Apply 1 application topically 2 (two) times daily. 08/29/15  Yes Historical Provider, MD  doxycycline (VIBRA-TABS) 100 MG tablet Take 1 tablet (100 mg total) by mouth every 12 (twelve) hours. Patient not taking: Reported on 02/04/2016 07/24/15   Marinda Elk, MD  naproxen (NAPROSYN) 500 MG tablet Take 1 tablet (500 mg total) by mouth 2 (two) times daily. Patient not taking: Reported on 07/21/2015 04/13/15   Dione Booze, MD  oseltamivir (TAMIFLU) 75 MG capsule Take 1 capsule (75 mg total) by mouth 2 (two) times daily. For 4 more days Patient not taking: Reported on 07/21/2015 04/24/15   Osvaldo Shipper, MD  oxyCODONE-acetaminophen (PERCOCET) 5-325 MG tablet Take 1 tablet by mouth every 4 (four) hours as needed for moderate pain. Patient not taking: Reported on 07/21/2015 04/13/15   Dione Booze, MD  predniSONE (DELTASONE) 10 MG tablet Takes 6 tablets for 1 days, then 5 tablets for 1 days, then 4 tablets for 1 days, then 3 tablets for 1 days, then 2 tabs for 1 days, then 1 tab for 1 days, and then stop. Patient not taking: Reported on 02/04/2016 07/24/15   Marinda Elk, MD  predniSONE (DELTASONE) 20 MG tablet Take 3 tablets once daily for 3 days, then take 2 tablets once daily for 3 days, then take 1 tablet once daily for 3 days, then STOP. 04/24/15   Osvaldo Shipper, MD    Family History Family History  Problem Relation Age of Onset  . Heart disease Mother   . Heart disease Father   . Diabetes Father   . Asthma Father   . Diabetes Brother   . Diabetes Sister   . Asthma Brother   . Asthma Sister   . Hypertension Sister     Social History Social History  Substance Use Topics  . Smoking status: Never Smoker  . Smokeless tobacco: Never Used  . Alcohol use No     Allergies   Contrast media [iodinated diagnostic agents]; Peanuts [peanut oil]; Shellfish allergy; Soap; and Eggs or egg-derived products   Review of Systems Review of Systems  All other systems reviewed and are  negative.    Physical Exam Updated Vital Signs BP 130/65 (BP Location: Left Arm)   Pulse 114   Temp 98 F (36.7 C)   Resp 22   Ht 5\' 3"  (1.6 m)   Wt 89.4 kg   SpO2 94%   BMI 34.90 kg/m   Physical Exam  Constitutional: She is oriented to person, place, and time. She appears well-developed and well-nourished. She appears distressed.  HENT:  Head: Normocephalic and atraumatic.  Right Ear: External ear normal.  Left Ear: External ear normal.  Nose: Nose normal.  Mildly dry oral mucosa  Eyes: Conjunctivae and EOM are normal. Pupils are equal, round, and reactive to light. Right eye exhibits no discharge. Left eye exhibits no discharge.  Neck: Normal range of motion. Neck supple. No JVD present.  Cardiovascular: Regular rhythm, normal heart sounds and intact distal pulses.  Tachycardia present.  Exam reveals no gallop and no friction rub.   No murmur  heard. Symmetrical radial pulses 2+, no lower extremity edema  Pulmonary/Chest: Accessory muscle usage present. No stridor. Tachypnea noted. She is in respiratory distress. She has decreased breath sounds in the right lower field and the left lower field. She has wheezes. She has rhonchi. She has no rales. She exhibits no tenderness.  Increased work of breathing with tachypnea. Scattered rhonchi, diminished breath sounds bilaterally the bases with diffuse prolonged expiratory wheeze in all lung fields. Patient able to speak one to 2 words at a time  Abdominal: Soft. Bowel sounds are normal. She exhibits no distension and no mass. There is no tenderness. There is no rebound and no guarding.  Musculoskeletal: Normal range of motion. She exhibits no deformity.  Neurological: She is alert and oriented to person, place, and time. She exhibits normal muscle tone. Coordination normal.  Skin: Skin is warm. Capillary refill takes less than 2 seconds. She is diaphoretic.  Psychiatric: She has a normal mood and affect. Her behavior is normal.  Judgment and thought content normal.  Nursing note and vitals reviewed.    ED Treatments / Results  DIAGNOSTIC STUDIES:  Oxygen Saturation is 93% on RA, low by my interpretation.    COORDINATION OF CARE:  1:12 AM Discussed treatment plan with pt at bedside which included breathing treatments and pt agreed to plan.  Labs (all labs ordered are listed, but only abnormal results are displayed) Labs Reviewed  CBC WITH DIFFERENTIAL/PLATELET - Abnormal; Notable for the following:       Result Value   WBC 11.0 (*)    Lymphs Abs 4.4 (*)    Monocytes Absolute 1.3 (*)    Eosinophils Absolute 0.9 (*)    All other components within normal limits  BASIC METABOLIC PANEL - Abnormal; Notable for the following:    Potassium 3.0 (*)    Glucose, Bld 132 (*)    All other components within normal limits  RESPIRATORY PANEL BY PCR    EKG  EKG Interpretation  Date/Time:  Sunday February 04 2016 00:33:40 EDT Ventricular Rate:  94 PR Interval:    QRS Duration: 98 QT Interval:  340 QTC Calculation: 426 R Axis:   72 Text Interpretation:  Sinus rhythm RSR' in V1 or V2, right VCD or RVH Nonspecific T abnormalities, inferior leads right sided strain - not new No significant change since last tracing Confirmed by Rhunette CroftNANAVATI, MD, Janey GentaANKIT [phone] on 02/04/2016 2:36:55 AM       Radiology Dg Chest 2 View  Result Date: 02/04/2016 CLINICAL DATA:  Shortness of breath for 1 day. EXAM: CHEST  2 VIEW COMPARISON:  07/21/2015 FINDINGS: Mild hyperinflation. Moderate central bronchial thickening, increased. Normal heart size and mediastinal contours. Mild left lung base atelectasis. No pleural fluid or pneumothorax. No pulmonary edema. IMPRESSION: Increased bronchial thickening, and may reflect acute on chronic bronchitis or asthma. Minimal left lung base atelectasis. Electronically Signed   By: Rubye OaksMelanie  Ehinger M.D.   On: 02/04/2016 02:27    Procedures Procedures (including critical care time)  Medications  Ordered in ED Medications  albuterol (PROVENTIL,VENTOLIN) solution continuous neb (0 mg/hr Nebulization Stopped 02/04/16 0548)  albuterol (PROVENTIL) (2.5 MG/3ML) 0.083% nebulizer solution 5 mg (5 mg Nebulization Given 02/04/16 0048)  methylPREDNISolone sodium succinate (SOLU-MEDROL) 125 mg/2 mL injection 125 mg (125 mg Intravenous Given 02/04/16 0129)  ipratropium (ATROVENT) nebulizer solution 0.5 mg (0.5 mg Nebulization Given 02/04/16 0122)  albuterol (PROVENTIL) (2.5 MG/3ML) 0.083% nebulizer solution 5 mg (5 mg Nebulization Given 02/04/16 0122)  albuterol (PROVENTIL) (2.5 MG/3ML)  0.083% nebulizer solution 5 mg (5 mg Nebulization Given 02/04/16 0254)  ipratropium (ATROVENT) nebulizer solution 0.5 mg (0.5 mg Nebulization Given 02/04/16 0254)  magnesium sulfate IVPB 2 g 50 mL (0 g Intravenous Stopped 02/04/16 0517)     Initial Impression / Assessment and Plan / ED Course  I have reviewed the triage vital signs and the nursing notes.  Pertinent labs & imaging results that were available during my care of the patient were reviewed by me and considered in my medical decision making (see chart for details).  Clinical Course  60 year old female presents with respiratory distress, history of COPD and asthma In the ER she was given Solu-Medrol, 3 duo nebs, 1 hour-long continuous nebulizer treatment.  Chest x-ray negative for pneumonia.  Patient continued to have audible wheeze and tachypnea despite interventions.  She has been admitted multiple times to stepdown unit for respiratory distress, previous admission with intubation.  Feel she needs admission for further treatment and monitoring.    Vitals:   02/04/16 0255 02/04/16 0351 02/04/16 0452 02/04/16 0546  BP:   133/63 130/65  Pulse:   101 114  Resp:   22 22  Temp:   98.4 F (36.9 C) 98 F (36.7 C)  TempSrc:   Oral   SpO2: 96% 96% 100% 94%  Weight:      Height:         Dr. Maryfrances Bunnell to admit for COPD exacerbation.  Final Clinical  Impressions(s) / ED Diagnoses   Final diagnoses:  COPD exacerbation (HCC)  Respiratory distress  Hypokalemia    New Prescriptions New Prescriptions   No medications on file  I personally performed the services described in this documentation, which was scribed in my presence. The recorded information has been reviewed and is accurate.       Danelle Berry, PA-C 02/04/16 4098    Derwood Kaplan, MD 02/04/16 1191

## 2016-02-04 NOTE — H&P (Signed)
TRH H&P   Patient Demographics:    Sonia Drake, is a 60 y.o. female  MRN: [id number]   DOB - 05-03-1955  Admit Date - 02/04/2016  Outpatient Primary MD for the patient is Verlon Au, MD  Referring MD/NP/PA: Steffanie Dunn  Outpatient Specialists: Pulm dr [place]ancy [place]  Patient coming from: Home  Chief Complaint  Patient presents with  . Shortness of Breath    Asthma Excerberation      HPI:    Sonia Drake  is a 60 y.o. female, With past medical history of asthma, (and per our record COPD which patient denies, reports she is seeing pulmonary mainly for asthma), following with Dr. Pearla Dubonnet pulmonary at [facility]. Patient presents with complaints of shortness of breath over last 3 days, initially exertional, but currently all times, as well cough with productive green sputum, as well reports significant wheezing where she had uses her inhaler 5 times yesterday , she see her pulmonary on Friday, hypothyroid activities as viral illness, she wasn't giving prednisone antibiotics , the symptoms has worsened since ,denies any fever or chills hemoptysis, chest pain, lower extremity edema, dysuria or polyuria. - Iin ED patient was with significant wheezing, received IV Solu-Medrol, magnesium sulfate and prolonged albuterol nebulizer treatment, his x-ray significant for bronchial thickening, and left lung base atelectasis.    Review of systems:    In addition to the HPI above,  No Fever-chills, No Headache, No changes with Vision or hearing, No problems swallowing food or Liquids, No Chest pain, Patient reports cough with productive green sputum, shortness of breath, No Abdominal pain, No Nausea or Vommitting, Bowel movements are regular, No Blood in stool or Urine, No dysuria, No new skin rashes or bruises, No new joints pains-aches,  No new weakness, tingling,  numbness in any extremity, No recent weight gain or loss, No polyuria, polydypsia or polyphagia, No significant Mental Stressors.  A full 10 point Review of Systems was done, except as stated above, all other Review of Systems were negative.   With Past History of the following :    Past Medical History:  Diagnosis Date  . Asthma   . COPD (chronic obstructive pulmonary disease) (HCC)       History reviewed. No pertinent surgical history.    Social History:     Social History  Substance Use Topics  . Smoking status: Never Smoker  . Smokeless tobacco: Never Used  . Alcohol use No     Lives - At home  Mobility - Independent     Family History :     Family History  Problem Relation Age of Onset  . Heart disease Mother   . Heart disease Father   . Diabetes Father   . Asthma Father   . Diabetes Brother   . Diabetes Sister   . Asthma Brother   . Asthma Sister   .  Hypertension Sister       Home Medications:   Prior to Admission medications   Medication Sig Start Date End Date Taking? Authorizing Provider  albuterol (PROVENTIL HFA;VENTOLIN HFA) 108 (90 BASE) MCG/ACT inhaler Inhale into the lungs every 6 (six) hours as needed for wheezing or shortness of breath.   Yes Historical Provider, MD  AZELASTINE HCL NA Place 1 spray into the nose 2 (two) times daily.   Yes Historical Provider, MD  budesonide-formoterol (SYMBICORT) 160-4.5 MCG/ACT inhaler Inhale 2 puffs into the lungs 2 (two) times daily.   Yes Historical Provider, MD  cetirizine (ZYRTEC) 10 MG tablet Take 10 mg by mouth daily as needed for allergies.    Yes Historical Provider, MD  cholecalciferol (VITAMIN D) 1000 UNITS tablet Take 1,000 Units by mouth daily.   Yes Historical Provider, MD  ibuprofen (ADVIL,MOTRIN) 200 MG tablet Take 400 mg by mouth 2 (two) times daily as needed for headache.   Yes Historical Provider, MD  ipratropium-albuterol (DUONEB) 0.5-2.5 (3) MG/3ML SOLN Take 3 mLs by nebulization  every 4 (four) hours as needed. Patient taking differently: Take 3 mLs by nebulization every 4 (four) hours as needed (wheezing/shortness of breath).  07/07/14  Yes Jeralyn Bennett, MD  montelukast (SINGULAIR) 10 MG tablet Take 10 mg by mouth at bedtime.   Yes Historical Provider, MD  pantoprazole (PROTONIX) 20 MG tablet Take 20 mg by mouth daily as needed for indigestion.   Yes Historical Provider, MD  theophylline (THEODUR) 300 MG 12 hr tablet Take 300 mg by mouth 2 (two) times daily.   Yes Historical Provider, MD  tiotropium (SPIRIVA) 18 MCG inhalation capsule Place 18 mcg into inhaler and inhale daily.   Yes Historical Provider, MD  triamcinolone lotion (KENALOG) 0.1 % Apply 1 application topically 2 (two) times daily. 08/29/15  Yes Historical Provider, MD  doxycycline (VIBRA-TABS) 100 MG tablet Take 1 tablet (100 mg total) by mouth every 12 (twelve) hours. Patient not taking: Reported on 02/04/2016 07/24/15   Marinda Elk, MD  naproxen (NAPROSYN) 500 MG tablet Take 1 tablet (500 mg total) by mouth 2 (two) times daily. Patient not taking: Reported on 07/21/2015 04/13/15   Dione Booze, MD  oseltamivir (TAMIFLU) 75 MG capsule Take 1 capsule (75 mg total) by mouth 2 (two) times daily. For 4 more days Patient not taking: Reported on 07/21/2015 04/24/15   Osvaldo Shipper, MD  oxyCODONE-acetaminophen (PERCOCET) 5-325 MG tablet Take 1 tablet by mouth every 4 (four) hours as needed for moderate pain. Patient not taking: Reported on 07/21/2015 04/13/15   Dione Booze, MD  predniSONE (DELTASONE) 10 MG tablet Takes 6 tablets for 1 days, then 5 tablets for 1 days, then 4 tablets for 1 days, then 3 tablets for 1 days, then 2 tabs for 1 days, then 1 tab for 1 days, and then stop. Patient not taking: Reported on 02/04/2016 07/24/15   Marinda Elk, MD  predniSONE (DELTASONE) 20 MG tablet Take 3 tablets once daily for 3 days, then take 2 tablets once daily for 3 days, then take 1 tablet once daily for 3 days,  then STOP. 04/24/15   Osvaldo Shipper, MD     Allergies:     Allergies  Allergen Reactions  . Contrast Media [Iodinated Diagnostic Agents]     Cardiac arrest  . Peanuts [Peanut Oil] Shortness Of Breath and Swelling  . Shellfish Allergy Shortness Of Breath and Swelling  . Soap     Laundry detergent-swelling/shortness of breath.  Marland Kitchen  Eggs Or Egg-Derived Products Rash     Physical Exam:   Vitals  Blood pressure 130/65, pulse 114, temperature 98 F (36.7 C), resp. rate 22, height 5\' 3"  (1.6 m), weight 89.4 kg (197 lb), SpO2 94 %.   1. General Well-developed female lying in bed in NAD,    2. Normal affect and insight, Not Suicidal or Homicidal, Awake Alert, Oriented X 3.  3. No F.N deficits, ALL C.Nerves Intact, Strength 5/5 all 4 extremities, Sensation intact all 4 extremities, Plantars down going.  4. Ears and Eyes appear Normal, Conjunctivae clear, PERRLA. Moist Oral Mucosa.  5. Supple Neck, No JVD, No cervical lymphadenopathy appriciated, No Carotid Bruits.  6. Symmetrical Chest wall movement, Good air movement bilaterally, scattered wheezing  7. RRR, No Gallops, Rubs or Murmurs, No Parasternal Heave.  8. Positive Bowel Sounds, Abdomen Soft, No tenderness, No organomegaly appriciated,No rebound -guarding or rigidity.  9.  No Cyanosis, Normal Skin Turgor, No Skin Rash or Bruise.  10. Good muscle tone,  joints appear normal , no effusions, Normal ROM.      Data Review:    CBC  Recent Labs Lab 02/04/16 0119  WBC 11.0*  HGB 12.3  HCT 37.7  PLT 261  MCV 88.7  MCH 28.9  MCHC 32.6  RDW 14.2  LYMPHSABS 4.4*  MONOABS 1.3*  EOSABS 0.9*  BASOSABS 0.0   ------------------------------------------------------------------------------------------------------------------  Chemistries   Recent Labs Lab 02/04/16 0119  NA 139  K 3.0*  CL 101  CO2 31  GLUCOSE 132*  BUN 7  CREATININE 0.95  CALCIUM 10.3    ------------------------------------------------------------------------------------------------------------------ estimated creatinine clearance is 65.1 mL/min (by C-G formula based on SCr of 0.95 mg/dL). ------------------------------------------------------------------------------------------------------------------ No results for input(s): TSH, T4TOTAL, T3FREE, THYROIDAB in the last 72 hours.  Invalid input(s): FREET3  Coagulation profile No results for input(s): INR, PROTIME in the last 168 hours. ------------------------------------------------------------------------------------------------------------------- No results for input(s): DDIMER in the last 72 hours. -------------------------------------------------------------------------------------------------------------------  Cardiac Enzymes No results for input(s): CKMB, TROPONINI, MYOGLOBIN in the last 168 hours.  Invalid input(s): CK ------------------------------------------------------------------------------------------------------------------    Component Value Date/Time   BNP 34.3 07/02/2014 1729     ---------------------------------------------------------------------------------------------------------------  Urinalysis    Component Value Date/Time   COLORURINE YELLOW 07/03/2014 1456   APPEARANCEUR CLEAR 07/03/2014 1456   LABSPEC 1.012 07/03/2014 1456   PHURINE 6.5 07/03/2014 1456   GLUCOSEU NEGATIVE 07/03/2014 1456   HGBUR NEGATIVE 07/03/2014 1456   BILIRUBINUR NEGATIVE 07/03/2014 1456   KETONESUR NEGATIVE 07/03/2014 1456   PROTEINUR NEGATIVE 07/03/2014 1456   UROBILINOGEN 0.2 07/03/2014 1456   NITRITE NEGATIVE 07/03/2014 1456   LEUKOCYTESUR NEGATIVE 07/03/2014 1456    ----------------------------------------------------------------------------------------------------------------   Imaging Results:    Dg Chest 2 View  Result Date: 02/04/2016 CLINICAL DATA:  Shortness of breath for 1 day. EXAM:  CHEST  2 VIEW COMPARISON:  07/21/2015 FINDINGS: Mild hyperinflation. Moderate central bronchial thickening, increased. Normal heart size and mediastinal contours. Mild left lung base atelectasis. No pleural fluid or pneumothorax. No pulmonary edema. IMPRESSION: Increased bronchial thickening, and may reflect acute on chronic bronchitis or asthma. Minimal left lung base atelectasis. Electronically Signed   By: Rubye Oaks M.D.   On: 02/04/2016 02:27    My personal review of EKG: Rhythm NSR, Rate  91 /min, QTc 426 , no Acute ST changes   Assessment & Plan:    Active Problems:   Chronic obstructive pulmonary disease with acute exacerbation (HCC)   Asthma   Asthma exacerbation   Lung nodule  Asthma exacerbation - Patient denies any history of COPD or emphysema in the past, following [facility] pulmonary [facility] who told her she does not have COPD, only asthma, denies any history of smoking, or secondhand smoking in the past, even though she has couple admissions stating COPD exacerbation, but it appears more likely acute asthma exacerbation - Patient will be admitted for IV steroids, DuoNeb every 6 hours, when necessary albuterol, will continue with Symbicort and Spiriva - Patient with productive cough with green sputum, and bronchial thickening on chest x-ray, this is most likely due to acute bronchitis, will start doxycycline - Continue with Singulair, she tells me she stopped taking theophylline anymore as she was instructed by her pulmonary physician  Lung nodule - Patient reports she has been followed for lung nodules, most recent CT done by her promary  pulmonologist for 4 month, and plan to repeat in 4 month, started to continue following with her pulmonary doctor physician regarding that.      DVT Prophylaxis Heparin - SCDs  AM Labs Ordered, also please review Full Orders  Family Communication: Admission, patients condition and plan of care including tests being  ordered have been discussed with the patient who indicate understanding and agree with the plan and Code Status.  Code Status Full  Likely DC to Home  Condition GUARDED    Consults called: None  Admission status: Inpatient  Time spent in minutes : 55 minutes   Pasqual Farias M.D on 02/04/2016 at 7:54 AM  Between 7am to 7pm - Pager - [phone]. After 7pm go to [email or web address] - password [email or web address]  [facility] - Office  [phone]

## 2016-02-04 NOTE — ED Notes (Signed)
Walked patient around ED patient had a O2 stat of 94% ans a heart rate of 95 to start. Once around the ED and her O2 stats were 91% and a heart rate of 125.

## 2016-02-04 NOTE — ED Notes (Signed)
No respiratory or acute distress noted resting in bed with eyes closed no reaction to medication noted call light in reach visitors at bedside auidable wheezing noted.

## 2016-02-05 DIAGNOSIS — E876 Hypokalemia: Secondary | ICD-10-CM

## 2016-02-05 DIAGNOSIS — J45901 Unspecified asthma with (acute) exacerbation: Secondary | ICD-10-CM | POA: Diagnosis not present

## 2016-02-05 DIAGNOSIS — R911 Solitary pulmonary nodule: Secondary | ICD-10-CM | POA: Diagnosis not present

## 2016-02-05 LAB — CBC
HCT: 37.8 % (ref 36.0–46.0)
Hemoglobin: 12 g/dL (ref 12.0–15.0)
MCH: 28.3 pg (ref 26.0–34.0)
MCHC: 31.7 g/dL (ref 30.0–36.0)
MCV: 89.2 fL (ref 78.0–100.0)
Platelets: 301 10*3/uL (ref 150–400)
RBC: 4.24 MIL/uL (ref 3.87–5.11)
RDW: 14.6 % (ref 11.5–15.5)
WBC: 21.7 10*3/uL — ABNORMAL HIGH (ref 4.0–10.5)

## 2016-02-05 LAB — BASIC METABOLIC PANEL
Anion gap: 9 (ref 5–15)
BUN: 10 mg/dL (ref 6–20)
CO2: 24 mmol/L (ref 22–32)
Calcium: 10.9 mg/dL — ABNORMAL HIGH (ref 8.9–10.3)
Chloride: 105 mmol/L (ref 101–111)
Creatinine, Ser: 0.74 mg/dL (ref 0.44–1.00)
GFR calc Af Amer: >60 mL/min (ref >60)
GFR calc non Af Amer: >60 mL/min (ref >60)
Glucose, Bld: 264 mg/dL — ABNORMAL HIGH (ref 65–99)
Potassium: 3.9 mmol/L (ref 3.5–5.1)
Sodium: 138 mmol/L (ref 135–145)

## 2016-02-05 MED ORDER — DOXYCYCLINE HYCLATE 100 MG PO TABS: 100.0000 mg | ORAL_TABLET | Freq: Two times a day (BID) | ORAL | 0 refills | Status: AC

## 2016-02-05 MED ORDER — PREDNISONE 20 MG PO TABS: 40.0000 mg | ORAL_TABLET | Freq: Every day | ORAL | 0 refills | Status: AC

## 2016-02-05 NOTE — Discharge Summary (Addendum)
Physician Discharge Summary  Sonia Drake VHQ:[id number] DOB: [birth year] DOA: 02/04/2016  PCP: Verlon Au, MD  Admit date: 02/04/2016 Discharge date: 02/05/2016  Admitted From: Home Disposition:  Home  Recommendations for Outpatient Follow-up:  1. Follow up with PCP in 1-2 weeks 2. Please obtain BMP/CBC in one week  Home Health: No Equipment/Devices: No  Discharge Condition: Stable CODE STATUS: Full code Diet recommendation:  Heart healthy  Brief/Interim Summary: 60 year old female with history of asthma who follows up with her pulmonologist Dr. Pearla Dubonnet pulmonary at [facility] presented with shortness of breath and cough productive of green phlegm associated with wheezing. Patient did not improve with inhalers. She was admitted for further evaluation. Chest x-ray showed no acute finding. Respiratory viral studies negative. She was not hypoxic. She was treated with IV Solu-Medrol, doxycycline and bronchodilators. Today patient clinically improved. She was able to walk in the hallway without shortness of breath or any difficulties. Oxygen saturation acceptable in room air. Patient reported clinically much improved. Denied fever, chills, headache, dizziness, nausea, vomiting, chest pain, abdominal pain. Patient reported that she follows up with her pulmonologist for right lung nodule. I advised her to continue to follow-up with her PCP and pulmonologist as an outpatient. Patient is discharged home with oral prednisone short course and oral doxycycline. The patient is medically stable on discharge. Leukocytosis today likely in the setting of steroid. She has no fever.  Hypokalemia improved on discharge. I advised her to check lab in a week with her PCP.  Discharge Diagnoses:  Active Problems:   Asthma exacerbation   Lung nodule    Discharge Instructions  Discharge Instructions    Call MD for:  difficulty breathing, headache or visual disturbances    Complete by:  As  directed    Call MD for:  hives    Complete by:  As directed    Call MD for:  persistant dizziness or light-headedness    Complete by:  As directed    Call MD for:  temperature >100.4    Complete by:  As directed    Diet - low sodium heart healthy    Complete by:  As directed    Increase activity slowly    Complete by:  As directed        Medication List    STOP taking these medications   naproxen 500 MG tablet Commonly known as:  NAPROSYN   oseltamivir 75 MG capsule Commonly known as:  TAMIFLU   oxyCODONE-acetaminophen 5-325 MG tablet Commonly known as:  PERCOCET   theophylline 300 MG 12 hr tablet Commonly known as:  THEODUR     TAKE these medications   albuterol 108 (90 Base) MCG/ACT inhaler Commonly known as:  PROVENTIL HFA;VENTOLIN HFA Inhale into the lungs every 6 (six) hours as needed for wheezing or shortness of breath.   AZELASTINE HCL NA Place 1 spray into the nose 2 (two) times daily.   budesonide-formoterol 160-4.5 MCG/ACT inhaler Commonly known as:  SYMBICORT Inhale 2 puffs into the lungs 2 (two) times daily.   cetirizine 10 MG tablet Commonly known as:  ZYRTEC Take 10 mg by mouth daily as needed for allergies.   cholecalciferol 1000 units tablet Commonly known as:  VITAMIN D Take 1,000 Units by mouth daily.   doxycycline 100 MG tablet Commonly known as:  VIBRA-TABS Take 1 tablet (100 mg total) by mouth every 12 (twelve) hours.   ibuprofen 200 MG tablet Commonly known as:  ADVIL,MOTRIN Take 400 mg by mouth  2 (two) times daily as needed for headache.   ipratropium-albuterol 0.5-2.5 (3) MG/3ML Soln Commonly known as:  DUONEB Take 3 mLs by nebulization every 4 (four) hours as needed. What changed:  reasons to take this   montelukast 10 MG tablet Commonly known as:  SINGULAIR Take 10 mg by mouth at bedtime.   pantoprazole 20 MG tablet Commonly known as:  PROTONIX Take 20 mg by mouth daily as needed for indigestion.   predniSONE 20 MG  tablet Commonly known as:  DELTASONE Take 2 tablets (40 mg total) by mouth daily with breakfast. What changed:  how much to take  how to take this  when to take this  additional instructions  Another medication with the same name was removed. Continue taking this medication, and follow the directions you see here.   tiotropium 18 MCG inhalation capsule Commonly known as:  SPIRIVA Place 18 mcg into inhaler and inhale daily.   triamcinolone lotion 0.1 % Commonly known as:  KENALOG Apply 1 application topically 2 (two) times daily.      Follow-up Information    Verlon AuBoyd, Tammy Lamonica, MD. Schedule an appointment as soon as possible for a visit in 1 week(s).   Specialty:  Family Medicine Contact information: [address] [facility] [place] [address] [phone]          Allergies  Allergen Reactions  . Contrast Media [Iodinated Diagnostic Agents]     Cardiac arrest  . Peanuts [Peanut Oil] Shortness Of Breath and Swelling  . Shellfish Allergy Shortness Of Breath and Swelling  . Soap     Laundry detergent-swelling/shortness of breath.  . Eggs Or Egg-Derived Products Rash    Consultations:None   Procedures/Studies: Dg Chest 2 View  Result Date: 02/04/2016 CLINICAL DATA:  Shortness of breath for 1 day. EXAM: CHEST  2 VIEW COMPARISON:  07/21/2015 FINDINGS: Mild hyperinflation. Moderate central bronchial thickening, increased. Normal heart size and mediastinal contours. Mild left lung base atelectasis. No pleural fluid or pneumothorax. No pulmonary edema. IMPRESSION: Increased bronchial thickening, and may reflect acute on chronic bronchitis or asthma. Minimal left lung base atelectasis. Electronically Signed   By: Rubye OaksMelanie  Ehinger M.D.   On: 02/04/2016 02:27       Subjective: Patient was seen and examined at bedside. Patient denied fever, chills, nausea, vomiting, shortness of breath or cough. She reported feeling much better today.  Denied chest pain. She reported me that she walked in the hallway without any difficulties or any assistance. She wants to go home today with outpatient follow-up.   Discharge Exam: Vitals:   02/05/16 0459 02/05/16 0917  BP: 120/61   Pulse: 79 89  Resp: 18 18  Temp: 98.4 F (36.9 C)    Vitals:   02/04/16 2046 02/04/16 2100 02/05/16 0459 02/05/16 0917  BP:  (!) 108/47 120/61   Pulse:  94 79 89  Resp:  16 18 18   Temp:  98.2 F (36.8 C) 98.4 F (36.9 C)   TempSrc:  Oral Oral   SpO2: 98% 94% 94% 97%  Weight:      Height:        General: Pt is alert, awake, not in acute distress Cardiovascular: RRR, S1/S2 +, no rubs, no gallops Respiratory: Mild bilateral expiratory wheeze, good air entry. No crackle. Abdominal: Soft, NT, ND, bowel sounds + Extremities: no edema, no cyanosis    The results of significant diagnostics from this hospitalization (including imaging, microbiology, ancillary and laboratory) are listed below for  reference.     Microbiology: Recent Results (from the past 240 hour(s))  Respiratory Panel by PCR     Status: None   Collection Time: 02/04/16 10:09 AM  Result Value Ref Range Status   Adenovirus NOT DETECTED NOT DETECTED Final   Coronavirus 229E NOT DETECTED NOT DETECTED Final   Coronavirus HKU1 NOT DETECTED NOT DETECTED Final   Coronavirus NL63 NOT DETECTED NOT DETECTED Final   Coronavirus OC43 NOT DETECTED NOT DETECTED Final   Metapneumovirus NOT DETECTED NOT DETECTED Final   Rhinovirus / Enterovirus NOT DETECTED NOT DETECTED Final   Influenza A NOT DETECTED NOT DETECTED Final   Influenza B NOT DETECTED NOT DETECTED Final   Parainfluenza Virus 1 NOT DETECTED NOT DETECTED Final   Parainfluenza Virus 2 NOT DETECTED NOT DETECTED Final   Parainfluenza Virus 3 NOT DETECTED NOT DETECTED Final   Parainfluenza Virus 4 NOT DETECTED NOT DETECTED Final   Respiratory Syncytial Virus NOT DETECTED NOT DETECTED Final   Bordetella pertussis NOT DETECTED NOT  DETECTED Final   Chlamydophila pneumoniae NOT DETECTED NOT DETECTED Final   Mycoplasma pneumoniae NOT DETECTED NOT DETECTED Final    Comment: Performed at [facility]     Labs: BNP (last 3 results) No results for input(s): BNP in the last 8760 hours. Basic Metabolic Panel:  Recent Labs Lab 02/04/16 0119 02/05/16 0843  NA 139 138  K 3.0* 3.9  CL 101 105  CO2 31 24  GLUCOSE 132* 264*  BUN 7 10  CREATININE 0.95 0.74  CALCIUM 10.3 10.9*   Liver Function Tests: No results for input(s): AST, ALT, ALKPHOS, BILITOT, PROT, ALBUMIN in the last 168 hours. No results for input(s): LIPASE, AMYLASE in the last 168 hours. No results for input(s): AMMONIA in the last 168 hours. CBC:  Recent Labs Lab 02/04/16 0119 02/05/16 0843  WBC 11.0* 21.7*  NEUTROABS 4.4  --   HGB 12.3 12.0  HCT 37.7 37.8  MCV 88.7 89.2  PLT 261 301   Cardiac Enzymes: No results for input(s): CKTOTAL, CKMB, CKMBINDEX, TROPONINI in the last 168 hours. BNP: Invalid input(s): POCBNP CBG: No results for input(s): GLUCAP in the last 168 hours. D-Dimer No results for input(s): DDIMER in the last 72 hours. Hgb A1c No results for input(s): HGBA1C in the last 72 hours. Lipid Profile No results for input(s): CHOL, HDL, LDLCALC, TRIG, CHOLHDL, LDLDIRECT in the last 72 hours. Thyroid function studies No results for input(s): TSH, T4TOTAL, T3FREE, THYROIDAB in the last 72 hours.  Invalid input(s): FREET3 Anemia work up No results for input(s): VITAMINB12, FOLATE, FERRITIN, TIBC, IRON, RETICCTPCT in the last 72 hours. Urinalysis    Component Value Date/Time   COLORURINE YELLOW 07/03/2014 1456   APPEARANCEUR CLEAR 07/03/2014 1456   LABSPEC 1.012 07/03/2014 1456   PHURINE 6.5 07/03/2014 1456   GLUCOSEU NEGATIVE 07/03/2014 1456   HGBUR NEGATIVE 07/03/2014 1456   BILIRUBINUR NEGATIVE 07/03/2014 1456   KETONESUR NEGATIVE 07/03/2014 1456   PROTEINUR NEGATIVE 07/03/2014 1456   UROBILINOGEN 0.2 07/03/2014  1456   NITRITE NEGATIVE 07/03/2014 1456   LEUKOCYTESUR NEGATIVE 07/03/2014 1456   Sepsis Labs Invalid input(s): PROCALCITONIN,  WBC,  LACTICIDVEN Microbiology Recent Results (from the past 240 hour(s))  Respiratory Panel by PCR     Status: None   Collection Time: 02/04/16 10:09 AM  Result Value Ref Range Status   Adenovirus NOT DETECTED NOT DETECTED Final   Coronavirus 229E NOT DETECTED NOT DETECTED Final   Coronavirus HKU1 NOT DETECTED NOT DETECTED  Final   Coronavirus NL63 NOT DETECTED NOT DETECTED Final   Coronavirus OC43 NOT DETECTED NOT DETECTED Final   Metapneumovirus NOT DETECTED NOT DETECTED Final   Rhinovirus / Enterovirus NOT DETECTED NOT DETECTED Final   Influenza A NOT DETECTED NOT DETECTED Final   Influenza B NOT DETECTED NOT DETECTED Final   Parainfluenza Virus 1 NOT DETECTED NOT DETECTED Final   Parainfluenza Virus 2 NOT DETECTED NOT DETECTED Final   Parainfluenza Virus 3 NOT DETECTED NOT DETECTED Final   Parainfluenza Virus 4 NOT DETECTED NOT DETECTED Final   Respiratory Syncytial Virus NOT DETECTED NOT DETECTED Final   Bordetella pertussis NOT DETECTED NOT DETECTED Final   Chlamydophila pneumoniae NOT DETECTED NOT DETECTED Final   Mycoplasma pneumoniae NOT DETECTED NOT DETECTED Final    Comment: Performed at [facility]     Time coordinating discharge: Over 30 minutes  SIGNED:   Maxie Barb, MD  [facility] 02/05/2016, 10:41 AM Pager   If 7PM-7AM, please contact night-coverage [email or web address] Password [email or web address]

## 2016-02-05 NOTE — Progress Notes (Signed)
Patient given discharge instructions and prescriptions. No questions or concerns at this time. Patient transported via wheelchair to the car. 

## 2016-10-19 IMAGING — CR DG CHEST 2V
2 series · 2 of 2 positions shown · non-contrast
Comparison: April 13, 2015.

CLINICAL DATA: Shortness of breath for 4 days.

EXAM:
CHEST  2 VIEW

[w chest pa]
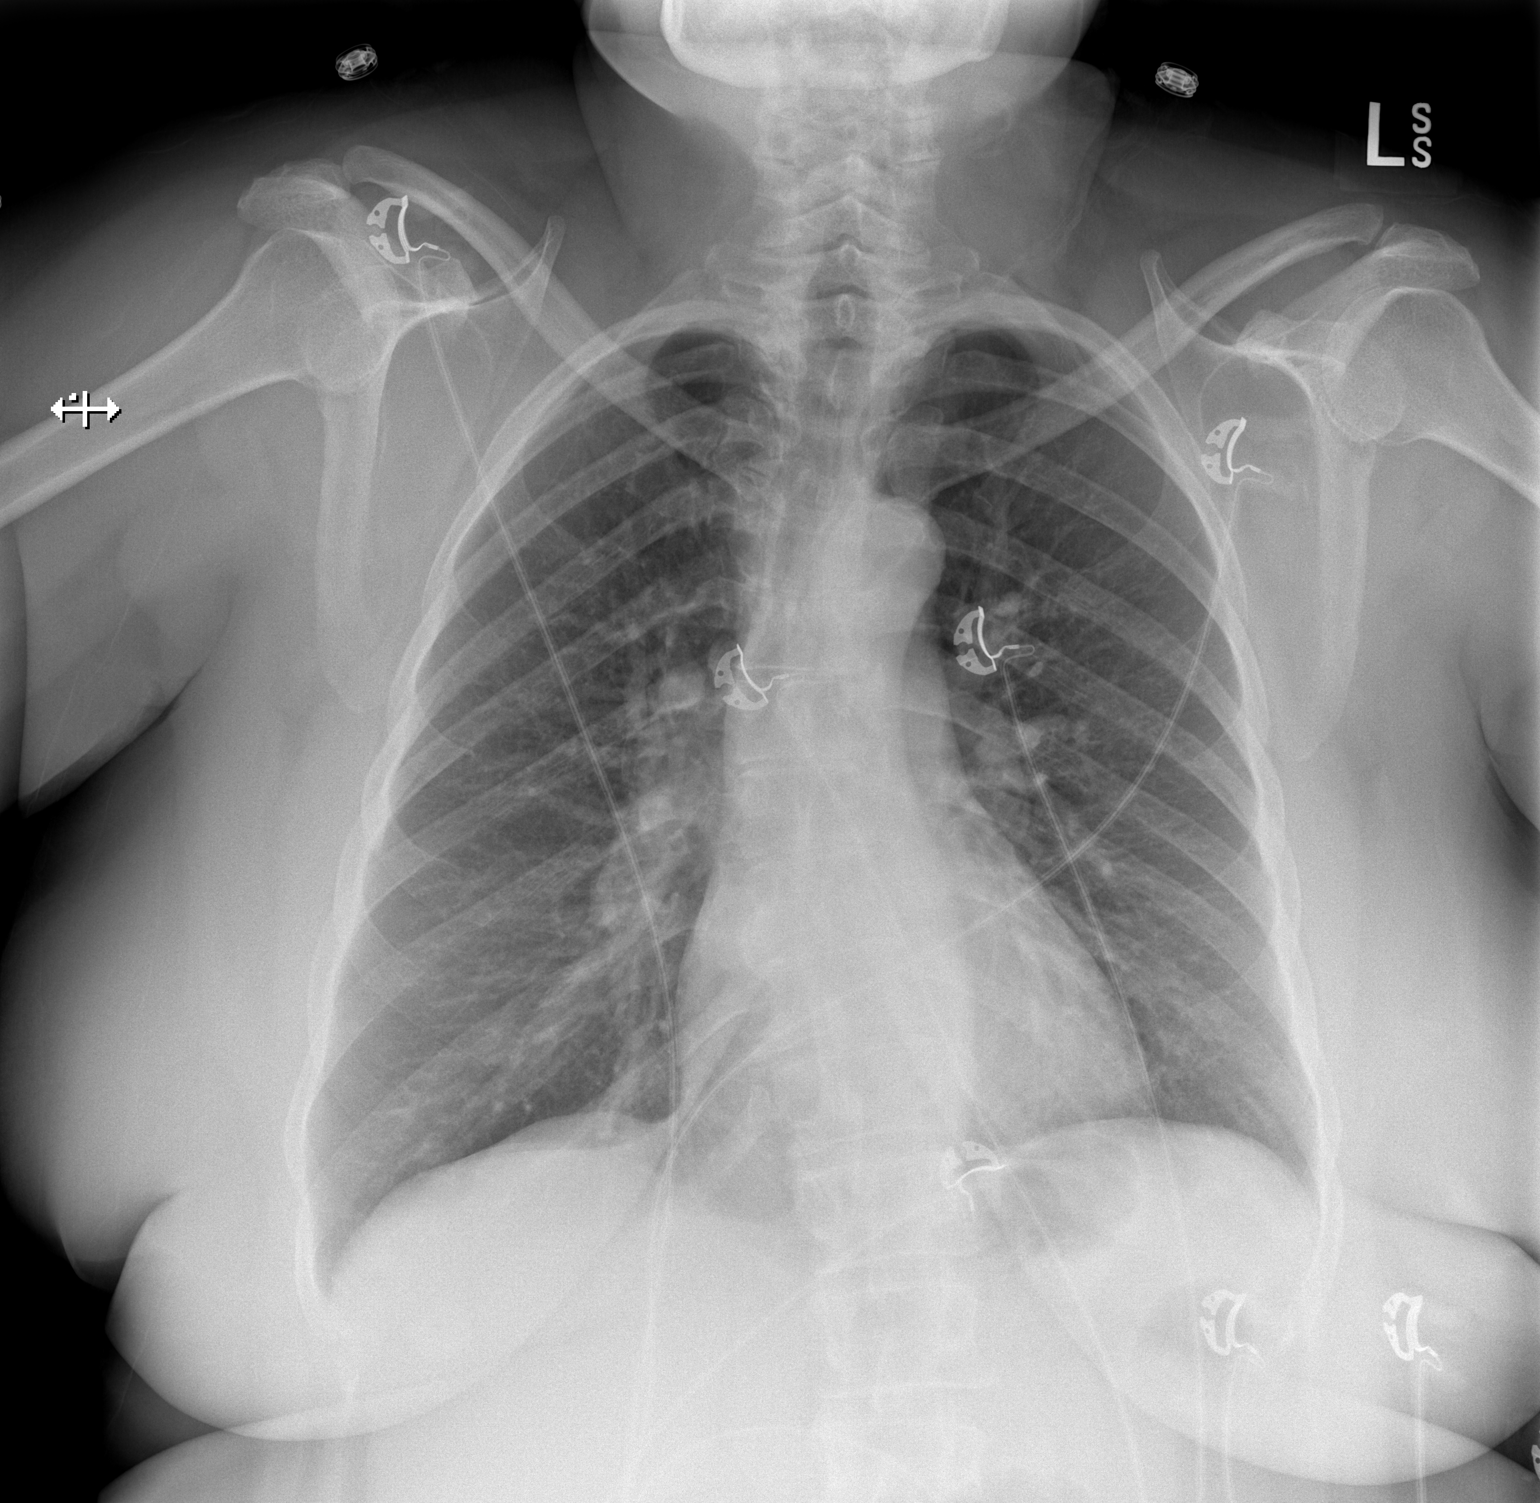

[w chest lat]
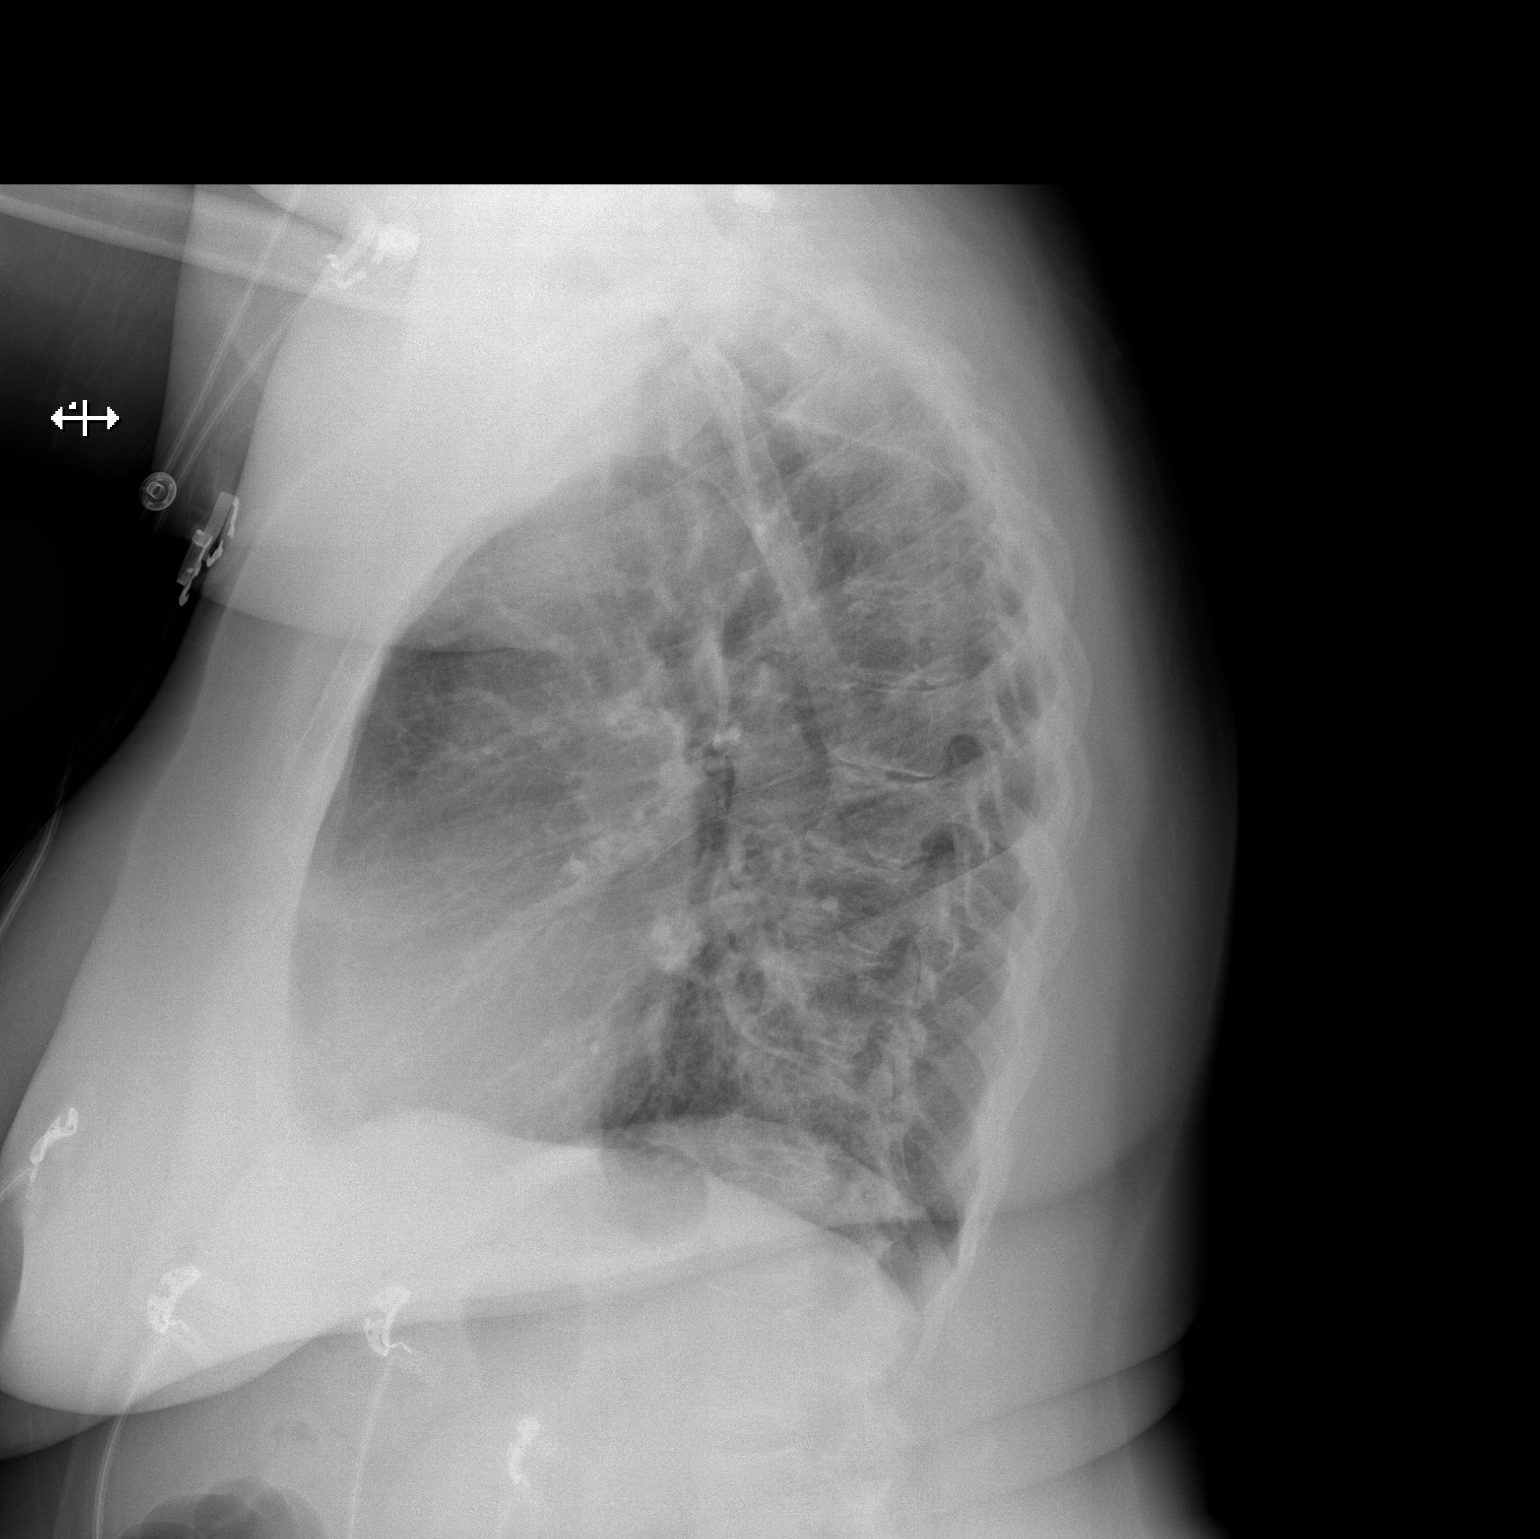

[2 of 2 positions shown; findings below may reference images not displayed]

FINDINGS: The heart size and mediastinal contours are within normal limits.
Both lungs are clear. No pneumothorax or pleural effusion is noted.
The visualized skeletal structures are unremarkable.
IMPRESSION: No active cardiopulmonary disease.

## 2017-02-16 ENCOUNTER — Inpatient Hospital Stay (HOSPITAL_COMMUNITY)
Admission: EM | Admit: 2017-02-16 | Discharge: 2017-02-18 | DRG: 203 | Disposition: A | Discharge status: Home / Self Care | Payer: Medicaid Other | Attending: Internal Medicine | Admitting: Internal Medicine

## 2017-02-16 ENCOUNTER — Emergency Department (HOSPITAL_COMMUNITY): Payer: Medicaid Other

## 2017-02-16 ENCOUNTER — Encounter (HOSPITAL_COMMUNITY): Payer: Self-pay | Admitting: *Deleted

## 2017-02-16 DIAGNOSIS — J4541 Moderate persistent asthma with (acute) exacerbation: Secondary | ICD-10-CM

## 2017-02-16 DIAGNOSIS — Z91010 Allergy to peanuts: Secondary | ICD-10-CM

## 2017-02-16 DIAGNOSIS — Z825 Family history of asthma and other chronic lower respiratory diseases: Secondary | ICD-10-CM

## 2017-02-16 DIAGNOSIS — Z23 Encounter for immunization: Secondary | ICD-10-CM

## 2017-02-16 DIAGNOSIS — Z7951 Long term (current) use of inhaled steroids: Secondary | ICD-10-CM

## 2017-02-16 DIAGNOSIS — Z6836 Body mass index (BMI) 36.0-36.9, adult: Secondary | ICD-10-CM

## 2017-02-16 DIAGNOSIS — J45901 Unspecified asthma with (acute) exacerbation: Secondary | ICD-10-CM | POA: Diagnosis present

## 2017-02-16 DIAGNOSIS — Z79899 Other long term (current) drug therapy: Secondary | ICD-10-CM

## 2017-02-16 DIAGNOSIS — Z9109 Other allergy status, other than to drugs and biological substances: Secondary | ICD-10-CM

## 2017-02-16 DIAGNOSIS — E669 Obesity, unspecified: Secondary | ICD-10-CM | POA: Diagnosis present

## 2017-02-16 DIAGNOSIS — J449 Chronic obstructive pulmonary disease, unspecified: Secondary | ICD-10-CM | POA: Diagnosis present

## 2017-02-16 DIAGNOSIS — Z91041 Radiographic dye allergy status: Secondary | ICD-10-CM

## 2017-02-16 DIAGNOSIS — Z91040 Latex allergy status: Secondary | ICD-10-CM

## 2017-02-16 DIAGNOSIS — Z91012 Allergy to eggs: Secondary | ICD-10-CM

## 2017-02-16 LAB — CBC
HCT: 40.1 % (ref 36.0–46.0)
Hemoglobin: 12.8 g/dL (ref 12.0–15.0)
MCH: 29.2 pg (ref 26.0–34.0)
MCHC: 31.9 g/dL (ref 30.0–36.0)
MCV: 91.3 fL (ref 78.0–100.0)
Platelets: 235 10*3/uL (ref 150–400)
RBC: 4.39 MIL/uL (ref 3.87–5.11)
RDW: 14.7 % (ref 11.5–15.5)
WBC: 9.4 10*3/uL (ref 4.0–10.5)

## 2017-02-16 LAB — CBC WITH DIFFERENTIAL/PLATELET
Basophils Absolute: 0 10*3/uL (ref 0.0–0.1)
Basophils Relative: 1 %
Eosinophils Absolute: 0.5 10*3/uL (ref 0.0–0.7)
Eosinophils Relative: 7 %
HCT: 41 % (ref 36.0–46.0)
Hemoglobin: 13.3 g/dL (ref 12.0–15.0)
Lymphocytes Relative: 43 %
Lymphs Abs: 2.9 10*3/uL (ref 0.7–4.0)
MCH: 29.6 pg (ref 26.0–34.0)
MCHC: 32.4 g/dL (ref 30.0–36.0)
MCV: 91.3 fL (ref 78.0–100.0)
Monocytes Absolute: 0.4 10*3/uL (ref 0.1–1.0)
Monocytes Relative: 6 %
Neutro Abs: 2.9 10*3/uL (ref 1.7–7.7)
Neutrophils Relative %: 43 %
Platelets: 236 10*3/uL (ref 150–400)
RBC: 4.49 MIL/uL (ref 3.87–5.11)
RDW: 14.7 % (ref 11.5–15.5)
WBC: 6.6 10*3/uL (ref 4.0–10.5)

## 2017-02-16 LAB — COMPREHENSIVE METABOLIC PANEL
ALT: 21 U/L (ref 14–54)
AST: 22 U/L (ref 15–41)
Albumin: 3.9 g/dL (ref 3.5–5.0)
Alkaline Phosphatase: 83 U/L (ref 38–126)
Anion gap: 10 (ref 5–15)
BUN: 5 mg/dL — ABNORMAL LOW (ref 6–20)
CO2: 29 mmol/L (ref 22–32)
Calcium: 9.9 mg/dL (ref 8.9–10.3)
Chloride: 101 mmol/L (ref 101–111)
Creatinine, Ser: 0.69 mg/dL (ref 0.44–1.00)
GFR calc Af Amer: >60 mL/min (ref >60)
GFR calc non Af Amer: >60 mL/min (ref >60)
Glucose, Bld: 105 mg/dL — ABNORMAL HIGH (ref 65–99)
Potassium: 3.6 mmol/L (ref 3.5–5.1)
Sodium: 140 mmol/L (ref 135–145)
Total Bilirubin: 0.7 mg/dL (ref 0.3–1.2)
Total Protein: 7.7 g/dL (ref 6.5–8.1)

## 2017-02-16 LAB — CREATININE, SERUM
Creatinine, Ser: 0.7 mg/dL (ref 0.44–1.00)
GFR calc Af Amer: >60 mL/min (ref >60)
GFR calc non Af Amer: >60 mL/min (ref >60)

## 2017-02-16 MED ORDER — ONDANSETRON HCL 4 MG/2ML IJ SOLN: 4.0000 mg | Freq: Four times a day (QID) | INTRAMUSCULAR | Status: DC | PRN

## 2017-02-16 MED ORDER — ONDANSETRON HCL 4 MG PO TABS: 4.0000 mg | ORAL_TABLET | Freq: Four times a day (QID) | ORAL | Status: DC | PRN

## 2017-02-16 MED ORDER — ORAL CARE MOUTH RINSE
15.0000 mL | Freq: Two times a day (BID) | OROMUCOSAL | Status: DC
  Administered 2017-02-17 – 2017-02-18 (×2): 15 mL via OROMUCOSAL

## 2017-02-16 MED ORDER — ACETAMINOPHEN 650 MG RE SUPP: 650.0000 mg | Freq: Four times a day (QID) | RECTAL | Status: DC | PRN

## 2017-02-16 MED ORDER — ALBUTEROL SULFATE (2.5 MG/3ML) 0.083% IN NEBU
5.0000 mg | INHALATION_SOLUTION | Freq: Once | RESPIRATORY_TRACT | Status: DC
  Administered 2017-02-16: 5 mg via RESPIRATORY_TRACT
  Filled 2017-02-16: qty 6

## 2017-02-16 MED ORDER — IPRATROPIUM-ALBUTEROL 0.5-2.5 (3) MG/3ML IN SOLN
3.0000 mL | Freq: Once | RESPIRATORY_TRACT | Status: AC
  Administered 2017-02-16: 3 mL via RESPIRATORY_TRACT
  Filled 2017-02-16: qty 3

## 2017-02-16 MED ORDER — PANTOPRAZOLE SODIUM 20 MG PO TBEC
20.0000 mg | DELAYED_RELEASE_TABLET | Freq: Every day | ORAL | Status: DC | PRN
  Filled 2017-02-16: qty 1

## 2017-02-16 MED ORDER — ACETAMINOPHEN 325 MG PO TABS: 650.0000 mg | ORAL_TABLET | Freq: Four times a day (QID) | ORAL | Status: DC | PRN

## 2017-02-16 MED ORDER — METHYLPREDNISOLONE SODIUM SUCC 125 MG IJ SOLR
125.0000 mg | Freq: Once | INTRAMUSCULAR | Status: AC
  Administered 2017-02-16: 125 mg via INTRAVENOUS
  Filled 2017-02-16: qty 2

## 2017-02-16 MED ORDER — LORATADINE 10 MG PO TABS
10.0000 mg | ORAL_TABLET | Freq: Every day | ORAL | Status: DC
  Administered 2017-02-16 – 2017-02-18 (×3): 10 mg via ORAL
  Filled 2017-02-16 (×3): qty 1

## 2017-02-16 MED ORDER — SODIUM CHLORIDE 0.9 % IV SOLN
INTRAVENOUS | Status: DC
  Administered 2017-02-16: 21:00:00 via INTRAVENOUS

## 2017-02-16 MED ORDER — MONTELUKAST SODIUM 10 MG PO TABS
10.0000 mg | ORAL_TABLET | Freq: Every day | ORAL | Status: DC
  Administered 2017-02-16 – 2017-02-17 (×2): 10 mg via ORAL
  Filled 2017-02-16 (×2): qty 1

## 2017-02-16 MED ORDER — ALBUTEROL (5 MG/ML) CONTINUOUS INHALATION SOLN
10.0000 mg/h | INHALATION_SOLUTION | Freq: Once | RESPIRATORY_TRACT | Status: AC
  Administered 2017-02-16: 10 mg/h via RESPIRATORY_TRACT
  Filled 2017-02-16: qty 20

## 2017-02-16 MED ORDER — MAGNESIUM SULFATE 2 GM/50ML IV SOLN
2.0000 g | Freq: Once | INTRAVENOUS | Status: AC
  Administered 2017-02-16: 2 g via INTRAVENOUS
  Filled 2017-02-16: qty 50

## 2017-02-16 MED ORDER — ENOXAPARIN SODIUM 40 MG/0.4ML ~~LOC~~ SOLN
40.0000 mg | SUBCUTANEOUS | Status: DC
  Filled 2017-02-16 (×2): qty 0.4

## 2017-02-16 MED ORDER — METHYLPREDNISOLONE SODIUM SUCC 125 MG IJ SOLR
60.0000 mg | Freq: Four times a day (QID) | INTRAMUSCULAR | Status: DC
  Administered 2017-02-16 – 2017-02-18 (×6): 60 mg via INTRAVENOUS
  Filled 2017-02-16 (×6): qty 2

## 2017-02-16 MED ORDER — ALBUTEROL SULFATE (2.5 MG/3ML) 0.083% IN NEBU
2.5000 mg | INHALATION_SOLUTION | RESPIRATORY_TRACT | Status: DC | PRN
  Administered 2017-02-17 – 2017-02-18 (×2): 2.5 mg via RESPIRATORY_TRACT
  Filled 2017-02-16 (×2): qty 3

## 2017-02-16 MED ORDER — PNEUMOCOCCAL VAC POLYVALENT 25 MCG/0.5ML IJ INJ
0.5000 mL | INJECTION | INTRAMUSCULAR | Status: AC
  Administered 2017-02-17: 0.5 mL via INTRAMUSCULAR
  Filled 2017-02-16: qty 0.5

## 2017-02-16 MED ORDER — IPRATROPIUM-ALBUTEROL 0.5-2.5 (3) MG/3ML IN SOLN
3.0000 mL | Freq: Four times a day (QID) | RESPIRATORY_TRACT | Status: DC
  Administered 2017-02-16: 3 mL via RESPIRATORY_TRACT
  Filled 2017-02-16: qty 3

## 2017-02-16 MED ORDER — IPRATROPIUM-ALBUTEROL 0.5-2.5 (3) MG/3ML IN SOLN
3.0000 mL | Freq: Four times a day (QID) | RESPIRATORY_TRACT | Status: DC
  Administered 2017-02-17 – 2017-02-18 (×6): 3 mL via RESPIRATORY_TRACT
  Filled 2017-02-16 (×6): qty 3

## 2017-02-16 NOTE — ED Notes (Signed)
Notified RT, RT in route to pt room.

## 2017-02-16 NOTE — ED Notes (Signed)
ED TO INPATIENT HANDOFF REPORT  Name/Age/Gender Sonia Drake 61 y.o. female  Code Status    Code Status Orders        Start     Ordered   02/16/17 1415  Full code  Continuous     02/16/17 1415    Code Status History    Date Active Date Inactive Code Status Order ID Comments User Context   02/04/2016  7:52 AM 02/05/2016  2:13 PM Full Code [id number]  Elgergawy, Dawood S, MD ED   07/21/2015 11:17 PM 07/24/2015  4:36 PM Full Code [id number]  Doutova, Anastassia, MD ED   04/22/2015  1:56 PM 04/24/2015  2:22 PM Full Code [id number]  Rama, Christina P, MD Inpatient   07/02/2014  4:33 PM 07/07/2014  3:31 PM Full Code [id number]  Ranga, Simbiso, MD Inpatient   06/29/2014 12:55 AM 06/30/2014  8:46 PM Full Code [id number]  Niu, Xilin, MD Inpatient      Home/SNF/Other Home  Chief Complaint cough  Level of Care/Admitting Diagnosis ED Disposition    ED Disposition Condition Comment   Admit  Hospital Area: [facility] [[id number]]  Level of Care: Telemetry [5]  Admit to tele based on following criteria: Other see comments  Comments: severe asthma  Diagnosis: Asthma exacerbation [697512]  Admitting Physician: HOBBS, PHILLIP M [[id number]]  Attending Physician: HOBBS, PHILLIP M [[id number]]  PT Class (Do Not Modify): Observation [104]  PT Acc Code (Do Not Modify): Observation [10022]       Medical History Past Medical History:  Diagnosis Date  . Asthma   . COPD (chronic obstructive pulmonary disease) (HCC)     Allergies Allergies  Allergen Reactions  . Contrast Media [Iodinated Diagnostic Agents]     Cardiac arrest  . Peanuts [Peanut Oil] Shortness Of Breath and Swelling  . Shellfish Allergy Shortness Of Breath and Swelling  . Latex Hives  . Soap     Laundry detergent-swelling/shortness of breath.  . Eggs Or Egg-Derived Products Rash    IV Location/Drains/Wounds Patient Lines/Drains/Airways Status   Active Line/Drains/Airways    Name:   Placement date:   Placement  time:   Site:   Days:   Peripheral IV 02/16/17 Right Antecubital  02/16/17    1040    Antecubital    less than 1          Labs/Imaging Results for orders placed or performed during the hospital encounter of 02/16/17 (from the past 48 hour(s))  Comprehensive metabolic panel     Status: Abnormal   Collection Time: 02/16/17 10:45 AM  Result Value Ref Range   Sodium 140 135 - 145 mmol/L   Potassium 3.6 3.5 - 5.1 mmol/L   Chloride 101 101 - 111 mmol/L   CO2 29 22 - 32 mmol/L   Glucose, Bld 105 (H) 65 - 99 mg/dL   BUN 5 (L) 6 - 20 mg/dL   Creatinine, Ser 0.69 0.44 - 1.00 mg/dL   Calcium 9.9 8.9 - 10.3 mg/dL   Total Protein 7.7 6.5 - 8.1 g/dL   Albumin 3.9 3.5 - 5.0 g/dL   AST 22 15 - 41 U/L   ALT 21 14 - 54 U/L   Alkaline Phosphatase 83 38 - 126 U/L   Total Bilirubin 0.7 0.3 - 1.2 mg/dL   GFR calc non Af Amer >60 >60 mL/min   GFR calc Af Amer >60 >60 mL/min    Comment: (NOTE) The eGFR has been calculated using the CKD EPI equation. This calculation   has not been validated in all clinical situations. eGFR's persistently <60 mL/min signify possible Chronic Kidney Disease.    Anion gap 10 5 - 15  CBC WITH DIFFERENTIAL     Status: None   Collection Time: 02/16/17 10:45 AM  Result Value Ref Range   WBC 6.6 4.0 - 10.5 K/uL   RBC 4.49 3.87 - 5.11 MIL/uL   Hemoglobin 13.3 12.0 - 15.0 g/dL   HCT 41.0 36.0 - 46.0 %   MCV 91.3 78.0 - 100.0 fL   MCH 29.6 26.0 - 34.0 pg   MCHC 32.4 30.0 - 36.0 g/dL   RDW 14.7 11.5 - 15.5 %   Platelets 236 150 - 400 K/uL   Neutrophils Relative % 43 %   Neutro Abs 2.9 1.7 - 7.7 K/uL   Lymphocytes Relative 43 %   Lymphs Abs 2.9 0.7 - 4.0 K/uL   Monocytes Relative 6 %   Monocytes Absolute 0.4 0.1 - 1.0 K/uL   Eosinophils Relative 7 %   Eosinophils Absolute 0.5 0.0 - 0.7 K/uL   Basophils Relative 1 %   Basophils Absolute 0.0 0.0 - 0.1 K/uL   Dg Chest 2 View  Result Date: 02/16/2017 CLINICAL DATA:  Patient c/o asthma and wheezing since last night.  Reports tired inhaler and neb treatment without any relief. EXAM: CHEST  2 VIEW COMPARISON:  Chest x-ray dated 02/04/2016. FINDINGS: Heart size and mediastinal contours are within normal limits. Lungs are clear. No pleural effusion or pneumothorax seen. No acute or suspicious osseous finding. Mild degenerative change again noted within the scoliotic and slightly kyphotic thoracic spine. IMPRESSION: No active cardiopulmonary disease. No evidence of pneumonia or pulmonary edema. Electronically Signed   By: Franki Cabot M.D.   On: 02/16/2017 11:15    Pending Labs Unresulted Labs    Start     Ordered   02/23/17 0500  Creatinine, serum  (enoxaparin (LOVENOX)    CrCl >/= 30 ml/min)  Weekly,   R    Comments:  while on enoxaparin therapy    02/16/17 1415   02/17/17 1505  Basic metabolic panel  Tomorrow morning,   R     02/16/17 1415   02/17/17 0500  CBC  Tomorrow morning,   R     02/16/17 1415   02/16/17 1414  HIV antibody (Routine Testing)  Once,   R     02/16/17 1415   02/16/17 1414  CBC  (enoxaparin (LOVENOX)    CrCl >/= 30 ml/min)  Once,   R    Comments:  Baseline for enoxaparin therapy IF NOT ALREADY DRAWN.  Notify MD if PLT < 100 K.    02/16/17 1415   02/16/17 1414  Creatinine, serum  (enoxaparin (LOVENOX)    CrCl >/= 30 ml/min)  Once,   R    Comments:  Baseline for enoxaparin therapy IF NOT ALREADY DRAWN.    02/16/17 1415      Vitals/Pain Today's Vitals   02/16/17 1400 02/16/17 1430 02/16/17 1500 02/16/17 1530  BP: (!) 162/75 (!) 121/49 132/66 (!) 148/85  Pulse: (!) 106 (!) 106 (!) 105 (!) 104  Resp: (!) 21 (!) _0 Temp:      TempSrc:      SpO2: 96% 96% 96% 94%  Weight:      Height:        Isolation Precautions No active isolations  Medications Medications  magnesium sulfate IVPB 2 g 50 mL (2 g Intravenous New Bag/Given 02/16/17 1523)  methylPREDNISolone sodium succinate (SOLU-MEDROL) 125 mg/2 mL injection 60 mg (not administered)  ipratropium-albuterol (DUONEB)  0.5-2.5 (3) MG/3ML nebulizer solution 3 mL (not administered)  pantoprazole (PROTONIX) EC tablet 20 mg (not administered)  loratadine (CLARITIN) tablet 10 mg (not administered)  montelukast (SINGULAIR) tablet 10 mg (not administered)  enoxaparin (LOVENOX) injection 40 mg (not administered)  0.9 %  sodium chloride infusion (not administered)  acetaminophen (TYLENOL) tablet 650 mg (not administered)    Or  acetaminophen (TYLENOL) suppository 650 mg (not administered)  ondansetron (ZOFRAN) tablet 4 mg (not administered)    Or  ondansetron (ZOFRAN) injection 4 mg (not administered)  albuterol (PROVENTIL,VENTOLIN) solution continuous neb (10 mg/hr Nebulization Given 02/16/17 1026)  methylPREDNISolone sodium succinate (SOLU-MEDROL) 125 mg/2 mL injection 125 mg (125 mg Intravenous Given 02/16/17 1053)  ipratropium-albuterol (DUONEB) 0.5-2.5 (3) MG/3ML nebulizer solution 3 mL (3 mLs Nebulization Given 02/16/17 1313)    Mobility walks  

## 2017-02-16 NOTE — ED Notes (Signed)
Patient transported to X-ray 

## 2017-02-16 NOTE — ED Triage Notes (Signed)
Patient c/o asthma and wheezing since last night. Reports tired inhaler and neb treatment without any relief.

## 2017-02-16 NOTE — H&P (Signed)
[facility] History and Physical  Sonia HummerMary J Drake OZH:[id number] DOB: 1956-01-29 DOA: 02/16/2017  Referring physician:  PCP: Patient, No Pcp Per   Chief Complaint:   HPI: Sonia HummerMary J Drake is a 61 y.o. female with past medical history of severe asthma exacerbations.  Patient states she is not sure what triggered this episode.  Normally uses her inhaler or nebulizer does well.  Multiple treatments with no relief.  Presents to the emergency room for evaluation.  ED course: Given steroids multiple treatments.  Patient made some modest improvement.  Still very dyspneic.  Hospitalist consulted for admission.   Review of Systems:  As per HPI otherwise 10 point review of systems negative.    Past Medical History:  Diagnosis Date  . Asthma   . COPD (chronic obstructive pulmonary disease) (HCC)    No past surgical history on file. Social History:  reports that she has never smoked. She has never used smokeless tobacco. She reports that she does not drink alcohol or use drugs.  Allergies  Allergen Reactions  . Contrast Media [Iodinated Diagnostic Agents]     Cardiac arrest  . Peanuts [Peanut Oil] Shortness Of Breath and Swelling  . Shellfish Allergy Shortness Of Breath and Swelling  . Latex Hives  . Soap     Laundry detergent-swelling/shortness of breath.  . Eggs Or Egg-Derived Products Rash    Family History  Problem Relation Age of Onset  . Heart disease Mother   . Heart disease Father   . Diabetes Father   . Asthma Father   . Diabetes Brother   . Diabetes Sister   . Asthma Brother   . Asthma Sister   . Hypertension Sister      Prior to Admission medications   Medication Sig Start Date End Date Taking? Authorizing Provider  albuterol (PROVENTIL HFA;VENTOLIN HFA) 108 (90 BASE) MCG/ACT inhaler Inhale into the lungs every 6 (six) hours as needed for wheezing or shortness of breath.   Yes [provider]  budesonide-formoterol (SYMBICORT) 160-4.5 MCG/ACT inhaler  Inhale 2 puffs into the lungs 2 (two) times daily.   Yes [provider]  calcium carbonate (OSCAL) 1500 (600 Ca) MG TABS tablet Take 600 mg of elemental calcium by mouth daily.   Yes [provider]  cetirizine (ZYRTEC) 10 MG tablet Take 10 mg by mouth daily as needed for allergies.    Yes [provider]  cholecalciferol (VITAMIN D) 1000 UNITS tablet Take 1,000 Units by mouth daily.   Yes [provider]  ipratropium-albuterol (DUONEB) 0.5-2.5 (3) MG/3ML SOLN Take 3 mLs by nebulization every 4 (four) hours as needed. Patient taking differently: Take 3 mLs by nebulization every 4 (four) hours as needed (wheezing/shortness of breath).  07/07/14  Yes Jeralyn BennettZamora, Ezequiel, MD  montelukast (SINGULAIR) 10 MG tablet Take 10 mg by mouth at bedtime.   Yes [provider]  Omega-3 Fatty Acids (FISH OIL) 1000 MG CAPS Take 1,000 mg by mouth daily.   Yes [provider]  pantoprazole (PROTONIX) 20 MG tablet Take 20 mg by mouth daily as needed for indigestion.   Yes [provider]  tiotropium (SPIRIVA) 18 MCG inhalation capsule Place 18 mcg into inhaler and inhale daily.   Yes [provider]  triamcinolone lotion (KENALOG) 0.1 % Apply 1 application topically 2 (two) times daily. 08/29/15  Yes [provider]   Physical Exam: Vitals:   02/16/17 1300 02/16/17 1314 02/16/17 1330 02/16/17 1400  BP: (!) 142/95  (!) 154/139 Marland Kitchen(!)  162/75  Pulse: (!) 107  (!) 109 (!) 106  Resp: 15  18 (!) 21  Temp:      TempSrc:      SpO2: 99% 100% 97% 96%  Weight:      Height:        Wt Readings from Last 3 Encounters:  02/16/17 89.4 kg (197 lb)  02/04/16 89.4 kg (197 lb)  07/21/15 96.8 kg (213 lb 6.5 oz)    General:  Appears calm and comfortable; a&Ox3 Eyes:  PERRL, EOMI, normal lids, iris ENT:  grossly normal hearing, lips & tongue Neck:  no LAD, masses or thyromegaly Cardiovascular:  RRR, no m/r/g. No LE edema.  Respiratory:  Diffuse  wheeeze. Inc WOB. Abdomen:  soft, ntnd Skin:  no rash or induration seen on limited exam Musculoskeletal:  grossly normal tone BUE/BLE Psychiatric:  grossly normal mood and affect, speech fluent and appropriate Neurologic:  CN 2-12 grossly intact, moves all extremities in coordinated fashion.          Labs on Admission:  Basic Metabolic Panel:  Recent Labs Lab 02/16/17 1045  NA 140  K 3.6  CL 101  CO2 29  GLUCOSE 105*  BUN 5*  CREATININE 0.69  CALCIUM 9.9   Liver Function Tests:  Recent Labs Lab 02/16/17 1045  AST 22  ALT 21  ALKPHOS 83  BILITOT 0.7  PROT 7.7  ALBUMIN 3.9   No results for input(s): LIPASE, AMYLASE in the last 168 hours. No results for input(s): AMMONIA in the last 168 hours. CBC:  Recent Labs Lab 02/16/17 1045  WBC 6.6  NEUTROABS 2.9  HGB 13.3  HCT 41.0  MCV 91.3  PLT 236   Cardiac Enzymes: No results for input(s): CKTOTAL, CKMB, CKMBINDEX, TROPONINI in the last 168 hours.  BNP (last 3 results) No results for input(s): BNP in the last 8760 hours.  ProBNP (last 3 results) No results for input(s): PROBNP in the last 8760 hours.   Serum creatinine: 0.69 mg/dL 16/10/96 0454 Estimated creatinine clearance: 76.4 mL/min  CBG: No results for input(s): GLUCAP in the last 168 hours.  Radiological Exams on Admission: Dg Chest 2 View  Result Date: 02/16/2017 CLINICAL DATA:  Patient c/o asthma and wheezing since last night. Reports tired inhaler and neb treatment without any relief. EXAM: CHEST  2 VIEW COMPARISON:  Chest x-ray dated 02/04/2016. FINDINGS: Heart size and mediastinal contours are within normal limits. Lungs are clear. No pleural effusion or pneumothorax seen. No acute or suspicious osseous finding. Mild degenerative change again noted within the scoliotic and slightly kyphotic thoracic spine. IMPRESSION: No active cardiopulmonary disease. No evidence of pneumonia or pulmonary edema. Electronically Signed   By: Bary Richard M.D.    On: 02/16/2017 11:15    EKG: pending  Assessment/Plan Active Problems:   Asthma exacerbation  Asthma Scheduled DuoNeb's When necessary albuterol Oxygen therapy Continuous pulse oximetry IV steroids  Code Status: FC, if intubated patient was use of neuromuscular blockers only DVT Prophylaxis: lovenox Family Communication: none at bedside Disposition Plan: Pending Improvement  Status: obs tele  Haydee Salter, MD Family Medicine [facility] [email or web address] Password [email or web address]

## 2017-02-16 NOTE — ED Provider Notes (Signed)
[facility]-EMERGENCY DEPT Provider Note   CSN: [id number] Arrival date & time: 02/16/17  0946     History   Chief Complaint Chief Complaint  Patient presents with  . Asthma    HPI Sonia Drake is a 61 y.o. female.  Pt presents to the ED today with sob.  She has a hx of asthma and COPD.  She has used her home nebs without relief of sx.  Pt denies any f/c.        Past Medical History:  Diagnosis Date  . Asthma   . COPD (chronic obstructive pulmonary disease) [facility])     Patient Active Problem List   Diagnosis Date Noted  . Lung nodule 02/04/2016  . COPD exacerbation (HCC) 07/21/2015  . Influenza B 04/23/2015  . Asthma exacerbation 04/22/2015  . Hypokalemia 04/22/2015  . 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 Influenza with pneumonia 07/03/2014  . Multiple lung nodules on CT 07/03/2014  . Asthma 07/02/2014  . Hyperglycemia 07/02/2014  . Chronic obstructive pulmonary disease with acute exacerbation (HCC)   . Prolonged QT interval 06/29/2014    No past surgical history on file.  OB History    No data available       Home Medications    Prior to Admission medications   Medication Sig Start Date End Date Taking? Authorizing Provider  albuterol (PROVENTIL HFA;VENTOLIN HFA) 108 (90 BASE) MCG/ACT inhaler Inhale into the lungs every 6 (six) hours as needed for wheezing or shortness of breath.   Yes [provider]  budesonide-formoterol (SYMBICORT) 160-4.5 MCG/ACT inhaler Inhale 2 puffs into the lungs 2 (two) times daily.   Yes [provider]  calcium carbonate (OSCAL) 1500 (600 Ca) MG TABS tablet Take 600 mg of elemental calcium by mouth daily.   Yes [provider]  cetirizine (ZYRTEC) 10 MG tablet Take 10 mg by mouth daily as needed for allergies.    Yes [provider]  cholecalciferol (VITAMIN D) 1000 UNITS tablet Take 1,000 Units by mouth daily.   Yes [provider]  ipratropium-albuterol  (DUONEB) 0.5-2.5 (3) MG/3ML SOLN Take 3 mLs by nebulization every 4 (four) hours as needed. Patient taking differently: Take 3 mLs by nebulization every 4 (four) hours as needed (wheezing/shortness of breath).  07/07/14  Yes Jeralyn Bennett, MD  montelukast (SINGULAIR) 10 MG tablet Take 10 mg by mouth at bedtime.   Yes [provider]  Omega-3 Fatty Acids (FISH OIL) 1000 MG CAPS Take 1,000 mg by mouth daily.   Yes [provider]  pantoprazole (PROTONIX) 20 MG tablet Take 20 mg by mouth daily as needed for indigestion.   Yes [provider]  tiotropium (SPIRIVA) 18 MCG inhalation capsule Place 18 mcg into inhaler and inhale daily.   Yes [provider]  triamcinolone lotion (KENALOG) 0.1 % Apply 1 application topically 2 (two) times daily. 08/29/15  Yes [provider]    Family History Family History  Problem Relation Age of Onset  . Heart disease Mother   . Heart disease Father   . Diabetes Father   . Asthma Father   . Diabetes Brother   . Diabetes Sister   . Asthma Brother   . Asthma Sister   . Hypertension Sister     Social History Social History  Substance Use Topics  . Smoking status: Never Smoker  . Smokeless tobacco: Never Used  . Alcohol use No     Allergies   Contrast media [iodinated diagnostic agents];  Peanuts [peanut oil]; Shellfish allergy; Latex; Soap; and Eggs or egg-derived products   Review of Systems Review of Systems  Respiratory: Positive for cough, shortness of breath and wheezing.   All other systems reviewed and are negative.    Physical Exam Updated Vital Signs BP (!) 162/75   Pulse (!) 106   Temp 98.3 F (36.8 C) (Oral)   Resp (!) 21   Ht 5\' 3"  (1.6 m)   Wt 89.4 kg (197 lb)   SpO2 96%   BMI 34.90 kg/m   Physical Exam  Constitutional: She is oriented to person, place, and time. She appears well-developed and well-nourished. She appears distressed.  HENT:  Head: Normocephalic and atraumatic.    Right Ear: External ear normal.  Left Ear: External ear normal.  Nose: Nose normal.  Mouth/Throat: Oropharynx is clear and moist.  Eyes: Pupils are equal, round, and reactive to light. Conjunctivae and EOM are normal.  Neck: Normal range of motion. Neck supple.  Cardiovascular: Normal rate, regular rhythm, normal heart sounds and intact distal pulses.   Pulmonary/Chest: She is in respiratory distress. She has wheezes.  Abdominal: Soft. Bowel sounds are normal.  Musculoskeletal: Normal range of motion.  Neurological: She is alert and oriented to person, place, and time.  Skin: Skin is warm and dry. Capillary refill takes less than 2 seconds.  Psychiatric: She has a normal mood and affect. Her behavior is normal. Judgment and thought content normal.  Nursing note and vitals reviewed.    ED Treatments / Results  Labs (all labs ordered are listed, but only abnormal results are displayed) Labs Reviewed  COMPREHENSIVE METABOLIC PANEL - Abnormal; Notable for the following:       Result Value   Glucose, Bld 105 (*)    BUN 5 (*)    All other components within normal limits  CBC WITH DIFFERENTIAL/PLATELET  HIV ANTIBODY (ROUTINE TESTING)  CBC  CREATININE, SERUM    EKG  EKG Interpretation None       Radiology Dg Chest 2 View  Result Date: 02/16/2017 CLINICAL DATA:  Patient c/o asthma and wheezing since last night. Reports tired inhaler and neb treatment without any relief. EXAM: CHEST  2 VIEW COMPARISON:  Chest x-ray dated 02/04/2016. FINDINGS: Heart size and mediastinal contours are within normal limits. Lungs are clear. No pleural effusion or pneumothorax seen. No acute or suspicious osseous finding. Mild degenerative change again noted within the scoliotic and slightly kyphotic thoracic spine. IMPRESSION: No active cardiopulmonary disease. No evidence of pneumonia or pulmonary edema. Electronically Signed   By: Bary Richard M.D.   On: 02/16/2017 11:15    Procedures Procedures  (including critical care time)  Medications Ordered in ED Medications  magnesium sulfate IVPB 2 g 50 mL (not administered)  methylPREDNISolone sodium succinate (SOLU-MEDROL) 125 mg/2 mL injection 60 mg (not administered)  ipratropium-albuterol (DUONEB) 0.5-2.5 (3) MG/3ML nebulizer solution 3 mL (not administered)  pantoprazole (PROTONIX) EC tablet 20 mg (not administered)  loratadine (CLARITIN) tablet 10 mg (not administered)  montelukast (SINGULAIR) tablet 10 mg (not administered)  enoxaparin (LOVENOX) injection 40 mg (not administered)  0.9 %  sodium chloride infusion (not administered)  acetaminophen (TYLENOL) tablet 650 mg (not administered)    Or  acetaminophen (TYLENOL) suppository 650 mg (not administered)  ondansetron (ZOFRAN) tablet 4 mg (not administered)    Or  ondansetron (ZOFRAN) injection 4 mg (not administered)  albuterol (PROVENTIL,VENTOLIN) solution continuous neb (10 mg/hr Nebulization Given 02/16/17 1026)  methylPREDNISolone sodium succinate (SOLU-MEDROL) 125 mg/2  mL injection 125 mg (125 mg Intravenous Given 02/16/17 1053)  ipratropium-albuterol (DUONEB) 0.5-2.5 (3) MG/3ML nebulizer solution 3 mL (3 mLs Nebulization Given 02/16/17 1313)     Initial Impression / Assessment and Plan / ED Course  I have reviewed the triage vital signs and the nursing notes.  Pertinent labs & imaging results that were available during my care of the patient were reviewed by me and considered in my medical decision making (see chart for details).    Pt's sx have improved and she feels like she can breathe a little easier; however, pt is still very sob and continues to have audible wheezes.  Pt d/w Dr. Melynda RippleHobbs who will admit.  Final Clinical Impressions(s) / ED Diagnoses   Final diagnoses:  Moderate persistent asthma with exacerbation    New Prescriptions New Prescriptions   No medications on file     Jacalyn LefevreHaviland, Ellieana Dolecki, MD 02/16/17 1424

## 2017-02-16 NOTE — ED Notes (Signed)
Contacted RT via phone.

## 2017-02-17 DIAGNOSIS — Z91041 Radiographic dye allergy status: Secondary | ICD-10-CM | POA: Diagnosis not present

## 2017-02-17 DIAGNOSIS — Z79899 Other long term (current) drug therapy: Secondary | ICD-10-CM | POA: Diagnosis not present

## 2017-02-17 DIAGNOSIS — Z7951 Long term (current) use of inhaled steroids: Secondary | ICD-10-CM | POA: Diagnosis not present

## 2017-02-17 DIAGNOSIS — Z91040 Latex allergy status: Secondary | ICD-10-CM | POA: Diagnosis not present

## 2017-02-17 DIAGNOSIS — Z9109 Other allergy status, other than to drugs and biological substances: Secondary | ICD-10-CM | POA: Diagnosis not present

## 2017-02-17 DIAGNOSIS — Z825 Family history of asthma and other chronic lower respiratory diseases: Secondary | ICD-10-CM | POA: Diagnosis not present

## 2017-02-17 DIAGNOSIS — Z6836 Body mass index (BMI) 36.0-36.9, adult: Secondary | ICD-10-CM | POA: Diagnosis not present

## 2017-02-17 DIAGNOSIS — E669 Obesity, unspecified: Secondary | ICD-10-CM | POA: Diagnosis present

## 2017-02-17 DIAGNOSIS — J4541 Moderate persistent asthma with (acute) exacerbation: Secondary | ICD-10-CM | POA: Diagnosis present

## 2017-02-17 DIAGNOSIS — Z91012 Allergy to eggs: Secondary | ICD-10-CM | POA: Diagnosis not present

## 2017-02-17 DIAGNOSIS — Z23 Encounter for immunization: Secondary | ICD-10-CM | POA: Diagnosis not present

## 2017-02-17 DIAGNOSIS — Z91010 Allergy to peanuts: Secondary | ICD-10-CM | POA: Diagnosis not present

## 2017-02-17 DIAGNOSIS — R0602 Shortness of breath: Secondary | ICD-10-CM | POA: Diagnosis present

## 2017-02-17 DIAGNOSIS — J449 Chronic obstructive pulmonary disease, unspecified: Secondary | ICD-10-CM | POA: Diagnosis present

## 2017-02-17 LAB — CBC
HCT: 39.6 % (ref 36.0–46.0)
Hemoglobin: 12.5 g/dL (ref 12.0–15.0)
MCH: 28.8 pg (ref 26.0–34.0)
MCHC: 31.6 g/dL (ref 30.0–36.0)
MCV: 91.2 fL (ref 78.0–100.0)
Platelets: 245 10*3/uL (ref 150–400)
RBC: 4.34 MIL/uL (ref 3.87–5.11)
RDW: 14.8 % (ref 11.5–15.5)
WBC: 8.5 10*3/uL (ref 4.0–10.5)

## 2017-02-17 LAB — BASIC METABOLIC PANEL
Anion gap: 10 (ref 5–15)
BUN: 12 mg/dL (ref 6–20)
CO2: 25 mmol/L (ref 22–32)
Calcium: 10.3 mg/dL (ref 8.9–10.3)
Chloride: 105 mmol/L (ref 101–111)
Creatinine, Ser: 0.63 mg/dL (ref 0.44–1.00)
GFR calc Af Amer: >60 mL/min (ref >60)
GFR calc non Af Amer: >60 mL/min (ref >60)
Glucose, Bld: 181 mg/dL — ABNORMAL HIGH (ref 65–99)
Potassium: 4.1 mmol/L (ref 3.5–5.1)
Sodium: 140 mmol/L (ref 135–145)

## 2017-02-17 LAB — HIV ANTIBODY (ROUTINE TESTING W REFLEX): HIV Screen 4th Generation wRfx: NONREACTIVE

## 2017-02-17 MED ORDER — DOXYCYCLINE HYCLATE 100 MG PO TABS
100.0000 mg | ORAL_TABLET | Freq: Two times a day (BID) | ORAL | Status: DC
  Administered 2017-02-17 – 2017-02-18 (×2): 100 mg via ORAL
  Filled 2017-02-17 (×2): qty 1

## 2017-02-17 NOTE — Progress Notes (Addendum)
Patient ID: Sonia Drake, female   DOB: [birth year], 61 y.o.   MRN: [id number]    PROGRESS NOTE  Sonia Drake  NFA:[id number] DOB: 1955/08/09 DOA: 02/16/2017  PCP: Patient, No Pcp Per   Brief Narrative:  Pt is 61 yo female with known hx of asthma, presented with main concern of several days duration or progressively worsening dyspnea, productive cough of green sputum, wheezing.   Assessment & Plan: Active Problems:   Asthma exacerbation - pt reports feeling better but still with course breath sounds and wheezing - continue current regimen with solumedrol - add empiric ABX as pt with productive cough - allow BD's scheduled and as needed  - ambulate as pt able to tolerate     Obesity  - Body mass index is 36.75 kg/m.   DVT prophylaxis: Lovenox SQ Code Status: Full  Family Communication: Patient at bedside  Disposition Plan: to be determined   Consultants:   None   Procedures:   None  Antimicrobials:   Doxycycline 10/28 -->  Subjective: Pt reports feeling better.   Objective: Vitals:   02/17/17 0513 02/17/17 0843 02/17/17 1152 02/17/17 1254  BP: 134/63   (!) 141/56  Pulse: 99   96  Resp: 18     Temp: 97.6 F (36.4 C)     TempSrc: Oral     SpO2: 97% 96% 93% 99%  Weight: 94.1 kg (207 lb 7.3 oz)     Height:        Intake/Output Summary (Last 24 hours) at 02/17/17 1441 Last data filed at 02/16/17 2200  Gross per 24 hour  Intake              240 ml  Output                0 ml  Net              240 ml   Filed Weights   02/16/17 0957 02/16/17 1640 02/17/17 0513  Weight: 89.4 kg (197 lb) 92.9 kg (204 lb 12.9 oz) 94.1 kg (207 lb 7.3 oz)    Examination:  General exam: Appears calm and comfortable  Respiratory system: Course breath sounds bilaterally with exp wheezing  Cardiovascular system: S1 & S2 heard, RRR. No JVD, murmurs, rubs, gallops or clicks. No pedal edema. Gastrointestinal system: Abdomen is nondistended, soft and nontender. No organomegaly or  masses felt. Normal bowel sounds heard. Central nervous system: Alert and oriented. No focal neurological deficits.   Data Reviewed: I have personally reviewed following labs and imaging studies  CBC:  Recent Labs Lab 02/16/17 1045 02/16/17 1539 02/17/17 0433  WBC 6.6 9.4 8.5  NEUTROABS 2.9  --   --   HGB 13.3 12.8 12.5  HCT 41.0 40.1 39.6  MCV 91.3 91.3 91.2  PLT 236 235 245   Basic Metabolic Panel:  Recent Labs Lab 02/16/17 1045 02/16/17 1539 02/17/17 0433  NA 140  --  140  K 3.6  --  4.1  CL 101  --  105  CO2 29  --  25  GLUCOSE 105*  --  181*  BUN 5*  --  12  CREATININE 0.69 0.70 0.63  CALCIUM 9.9  --  10.3   Liver Function Tests:  Recent Labs Lab 02/16/17 1045  AST 22  ALT 21  ALKPHOS 83  BILITOT 0.7  PROT 7.7  ALBUMIN 3.9   Urine analysis:    Component Value Date/Time   COLORURINE YELLOW 07/03/2014 1456  APPEARANCEUR CLEAR 07/03/2014 1456   LABSPEC 1.012 07/03/2014 1456   PHURINE 6.5 07/03/2014 1456   GLUCOSEU NEGATIVE 07/03/2014 1456   HGBUR NEGATIVE 07/03/2014 1456   BILIRUBINUR NEGATIVE 07/03/2014 1456   KETONESUR NEGATIVE 07/03/2014 1456   PROTEINUR NEGATIVE 07/03/2014 1456   UROBILINOGEN 0.2 07/03/2014 1456   NITRITE NEGATIVE 07/03/2014 1456   LEUKOCYTESUR NEGATIVE 07/03/2014 1456    Radiology Studies: Dg Chest 2 View  Result Date: 02/16/2017 CLINICAL DATA:  Patient c/o asthma and wheezing since last night. Reports tired inhaler and neb treatment without any relief. EXAM: CHEST  2 VIEW COMPARISON:  Chest x-ray dated 02/04/2016. FINDINGS: Heart size and mediastinal contours are within normal limits. Lungs are clear. No pleural effusion or pneumothorax seen. No acute or suspicious osseous finding. Mild degenerative change again noted within the scoliotic and slightly kyphotic thoracic spine. IMPRESSION: No active cardiopulmonary disease. No evidence of pneumonia or pulmonary edema. Electronically Signed   By: Bary RichardStan  Maynard M.D.   On:  02/16/2017 11:15    Scheduled Meds: . doxycycline  100 mg Oral Q12H  . enoxaparin (LOVENOX) injection  40 mg Subcutaneous Q24H  . ipratropium-albuterol  3 mL Nebulization QID  . loratadine  10 mg Oral Daily  . mouth rinse  15 mL Mouth Rinse BID  . methylPREDNISolone sodium succinate  60 mg Intravenous Q6H  . montelukast  10 mg Oral QHS   Continuous Infusions:   LOS: 0 days   Time spent: 25 minutes   Sonia PrestoIskra Magick-Shayanne Gomm, MD [facility] Pager [phone]  If 7PM-7AM, please contact night-coverage [email or web address] Password [email or web address] 02/17/2017, 2:41 PM

## 2017-02-17 NOTE — Discharge Instructions (Signed)

## 2017-02-18 LAB — BASIC METABOLIC PANEL
Anion gap: 11 (ref 5–15)
BUN: 14 mg/dL (ref 6–20)
CO2: 26 mmol/L (ref 22–32)
Calcium: 10.9 mg/dL — ABNORMAL HIGH (ref 8.9–10.3)
Chloride: 102 mmol/L (ref 101–111)
Creatinine, Ser: 0.68 mg/dL (ref 0.44–1.00)
GFR calc Af Amer: >60 mL/min (ref >60)
GFR calc non Af Amer: >60 mL/min (ref >60)
Glucose, Bld: 174 mg/dL — ABNORMAL HIGH (ref 65–99)
Potassium: 4.5 mmol/L (ref 3.5–5.1)
Sodium: 139 mmol/L (ref 135–145)

## 2017-02-18 LAB — CBC
HCT: 41.1 % (ref 36.0–46.0)
Hemoglobin: 13.1 g/dL (ref 12.0–15.0)
MCH: 29.2 pg (ref 26.0–34.0)
MCHC: 31.9 g/dL (ref 30.0–36.0)
MCV: 91.7 fL (ref 78.0–100.0)
Platelets: 254 10*3/uL (ref 150–400)
RBC: 4.48 MIL/uL (ref 3.87–5.11)
RDW: 15.1 % (ref 11.5–15.5)
WBC: 20.1 10*3/uL — ABNORMAL HIGH (ref 4.0–10.5)

## 2017-02-18 MED ORDER — IPRATROPIUM-ALBUTEROL 0.5-2.5 (3) MG/3ML IN SOLN: 3.0000 mL | RESPIRATORY_TRACT | 3 refills | Status: DC | PRN

## 2017-02-18 MED ORDER — DOXYCYCLINE HYCLATE 100 MG PO TABS: 100.0000 mg | ORAL_TABLET | Freq: Two times a day (BID) | ORAL | 0 refills | Status: DC

## 2017-02-18 MED ORDER — ALBUTEROL SULFATE HFA 108 (90 BASE) MCG/ACT IN AERS: 1.0000 | INHALATION_SPRAY | Freq: Four times a day (QID) | RESPIRATORY_TRACT | 3 refills | Status: DC | PRN

## 2017-02-18 MED ORDER — PREDNISONE 10 MG PO TABS: ORAL_TABLET | ORAL | 0 refills | Status: DC

## 2017-02-18 MED ORDER — PREDNISONE 50 MG PO TABS
50.0000 mg | ORAL_TABLET | Freq: Every day | ORAL | Status: DC
  Administered 2017-02-18: 50 mg via ORAL
  Filled 2017-02-18: qty 1

## 2017-02-18 NOTE — Discharge Summary (Signed)
Physician Discharge Summary  Niel HummerMary J Bellamy ZOX:[id number] DOB: May 05, 1955 DOA: 02/16/2017  PCP: Sonia Drake, No Pcp Per  Admit date: 02/16/2017 Discharge date: 02/18/2017  Recommendations for Outpatient Follow-up:  1. Pt will need to follow up with PCP in 2-3 weeks post discharge 2. Please obtain BMP to evaluate electrolytes and kidney function 3. Please also check CBC to evaluate Hg and Hct levels 4. Prednsione, 50 mg a day on 10/31, taper down by 10 mg a day until completed   Discharge Diagnoses:  Active Problems:   Asthma exacerbation    Discharge Condition: Stable  Diet recommendation: Heart healthy diet discussed in details   History of present illness:  Pt is 61 yo female with known hx of asthma, presented with main concern of several days duration or progressively worsening dyspnea, productive cough of green sputum, wheezing.   Hospital Course:   Assessment & Plan: Active Problems:   Asthma exacerbation - Continue prednisone taper as noted above - Continue bronchodilator treatment as prescribed - Continue doxycyline as prescribed     Obesity  - Body mass index is 36.75 kg/m. - Counseled on diet and nutrition    DVT prophylaxis: Lovenox subQ Code Status: Full  Family Communication: No family at the bedside    Consultants:   None   Procedures:   None  Antimicrobials:   Doxycycline 10/28 -->   Procedures/Studies: Dg Chest 2 View  Result Date: 02/16/2017 CLINICAL DATA:  Sonia Drake c/o asthma and wheezing since last night. Reports tired inhaler and neb treatment without any relief. EXAM: CHEST  2 VIEW COMPARISON:  Chest x-ray dated 02/04/2016. FINDINGS: Heart size and mediastinal contours are within normal limits. Lungs are clear. No pleural effusion or pneumothorax seen. No acute or suspicious osseous finding. Mild degenerative change again noted within the scoliotic and slightly kyphotic thoracic spine. IMPRESSION: No active cardiopulmonary  disease. No evidence of pneumonia or pulmonary edema. Electronically Signed   By: Bary RichardStan  Maynard M.D.   On: 02/16/2017 11:15    Discharge Exam: Vitals:   02/18/17 [id number] 02/18/17 0700  BP: (!) 119/52   Pulse: 90   Resp: 18   Temp: 98.6 F (37 C)   SpO2: 94% 95%   Vitals:   02/17/17 2114 02/18/17 0515 02/18/17 0632 02/18/17 0700  BP: 117/62  (!) 119/52   Pulse: 88  90   Resp: 17  18   Temp: 98.5 F (36.9 C)  98.6 F (37 C)   TempSrc: Oral  Oral   SpO2: 96% 94% 94% 95%  Weight:      Height:        General: Pt is alert, follows commands appropriately, not in acute distress Cardiovascular: Regular rate and rhythm, S1/S2 +, no murmurs, no rubs, no gallops Respiratory: Clear to auscultation bilaterally, no wheezing, no crackles, no rhonchi Abdominal: Soft, non tender, non distended, bowel sounds +, no guarding Extremities: no edema, no cyanosis, pulses palpable bilaterally DP and PT Neuro: Grossly nonfocal  Discharge Instructions  Discharge Instructions    Diet - low sodium heart healthy    Complete by:  As directed    Increase activity slowly    Complete by:  As directed      Allergies as of 02/18/2017      Reactions   Contrast Media [iodinated Diagnostic Agents]    Cardiac arrest   Peanuts [peanut Oil] Shortness Of Breath, Swelling   Shellfish Allergy Shortness Of Breath, Swelling   Latex Hives   Soap  Laundry detergent-swelling/shortness of breath.   Eggs Or Egg-derived Products Rash      Medication List    TAKE these medications   albuterol 108 (90 Base) MCG/ACT inhaler Commonly known as:  PROVENTIL HFA;VENTOLIN HFA Inhale 1-2 puffs into the lungs every 6 (six) hours as needed for wheezing or shortness of breath. What changed:  how much to take   budesonide-formoterol 160-4.5 MCG/ACT inhaler Commonly known as:  SYMBICORT Inhale 2 puffs into the lungs 2 (two) times daily.   calcium carbonate 1500 (600 Ca) MG Tabs tablet Commonly known as:  OSCAL Take  600 mg of elemental calcium by mouth daily.   cetirizine 10 MG tablet Commonly known as:  ZYRTEC Take 10 mg by mouth daily as needed for allergies.   cholecalciferol 1000 units tablet Commonly known as:  VITAMIN D Take 1,000 Units by mouth daily.   doxycycline 100 MG tablet Commonly known as:  VIBRA-TABS Take 1 tablet (100 mg total) by mouth every 12 (twelve) hours.   Fish Oil 1000 MG Caps Take 1,000 mg by mouth daily.   ipratropium-albuterol 0.5-2.5 (3) MG/3ML Soln Commonly known as:  DUONEB Take 3 mLs by nebulization every 4 (four) hours as needed. What changed:  reasons to take this   montelukast 10 MG tablet Commonly known as:  SINGULAIR Take 10 mg by mouth at bedtime.   pantoprazole 20 MG tablet Commonly known as:  PROTONIX Take 20 mg by mouth daily as needed for indigestion.   predniSONE 10 MG tablet Commonly known as:  DELTASONE Take prednisone 50 mg on 02/19/2017, taper down by 10 mg daily until completed.   tiotropium 18 MCG inhalation capsule Commonly known as:  SPIRIVA Place 18 mcg into inhaler and inhale daily.   triamcinolone lotion 0.1 % Commonly known as:  KENALOG Apply 1 application topically 2 (two) times daily.       Follow-up Information    Dorothea Ogle, MD Follow up.   Specialty:  Internal Medicine Why:  call my cell phone [phone] with questions  Contact information: [address] [place] [address] [phone]        Dorothea Ogle, MD .   Specialty:  Internal Medicine Contact information: [address] [place] [address] [phone]            The results of significant diagnostics from this hospitalization (including imaging, microbiology, ancillary and laboratory) are listed below for reference.     Microbiology: No results found for this or any previous visit (from the past 240 hour(s)).   Labs: Basic Metabolic Panel:  Recent Labs Lab 02/16/17 1045 02/16/17 1539  02/17/17 0433 02/18/17 0434  NA 140  --  140 139  K 3.6  --  4.1 4.5  CL 101  --  105 102  CO2 29  --  25 26  GLUCOSE 105*  --  181* 174*  BUN 5*  --  12 14  CREATININE 0.69 0.70 0.63 0.68  CALCIUM 9.9  --  10.3 10.9*   Liver Function Tests:  Recent Labs Lab 02/16/17 1045  AST 22  ALT 21  ALKPHOS 83  BILITOT 0.7  PROT 7.7  ALBUMIN 3.9   No results for input(s): LIPASE, AMYLASE in the last 168 hours. No results for input(s): AMMONIA in the last 168 hours. CBC:  Recent Labs Lab 02/16/17 1045 02/16/17 1539 02/17/17 0433 02/18/17 0434  WBC 6.6 9.4 8.5 20.1*  NEUTROABS 2.9  --   --   --  HGB 13.3 12.8 12.5 13.1  HCT 41.0 40.1 39.6 41.1  MCV 91.3 91.3 91.2 91.7  PLT 236 235 245 254   Cardiac Enzymes: No results for input(s): CKTOTAL, CKMB, CKMBINDEX, TROPONINI in the last 168 hours. BNP: BNP (last 3 results) No results for input(s): BNP in the last 8760 hours.  ProBNP (last 3 results) No results for input(s): PROBNP in the last 8760 hours.  CBG: No results for input(s): GLUCAP in the last 168 hours.   SIGNED: Time coordinating discharge: Over 30 minutes  Debbora Presto, MD  [facility] 02/18/2017, 9:08 AM Pager [phone]  If 7PM-7AM, please contact night-coverage [email or web address] Password [email or web address]

## 2017-03-07 ENCOUNTER — Other Ambulatory Visit: Payer: Self-pay

## 2017-03-07 ENCOUNTER — Emergency Department (HOSPITAL_COMMUNITY): Payer: Medicaid Other

## 2017-03-07 ENCOUNTER — Emergency Department (HOSPITAL_COMMUNITY)
Admission: EM | Admit: 2017-03-07 | Discharge: 2017-03-07 | Disposition: A | Discharge status: Home / Self Care | Payer: Medicaid Other | Attending: Emergency Medicine | Admitting: Emergency Medicine

## 2017-03-07 ENCOUNTER — Encounter (HOSPITAL_COMMUNITY): Payer: Self-pay | Admitting: Nurse Practitioner

## 2017-03-07 DIAGNOSIS — Z91040 Latex allergy status: Secondary | ICD-10-CM | POA: Diagnosis not present

## 2017-03-07 DIAGNOSIS — Z91010 Allergy to peanuts: Secondary | ICD-10-CM | POA: Diagnosis not present

## 2017-03-07 DIAGNOSIS — S61211A Laceration without foreign body of left index finger without damage to nail, initial encounter: Secondary | ICD-10-CM | POA: Diagnosis not present

## 2017-03-07 DIAGNOSIS — Y9389 Activity, other specified: Secondary | ICD-10-CM | POA: Diagnosis not present

## 2017-03-07 DIAGNOSIS — Y998 Other external cause status: Secondary | ICD-10-CM | POA: Insufficient documentation

## 2017-03-07 DIAGNOSIS — J449 Chronic obstructive pulmonary disease, unspecified: Secondary | ICD-10-CM | POA: Diagnosis not present

## 2017-03-07 DIAGNOSIS — Z79899 Other long term (current) drug therapy: Secondary | ICD-10-CM | POA: Diagnosis not present

## 2017-03-07 DIAGNOSIS — S6992XA Unspecified injury of left wrist, hand and finger(s), initial encounter: Secondary | ICD-10-CM | POA: Diagnosis present

## 2017-03-07 DIAGNOSIS — J45909 Unspecified asthma, uncomplicated: Secondary | ICD-10-CM | POA: Diagnosis not present

## 2017-03-07 DIAGNOSIS — Y929 Unspecified place or not applicable: Secondary | ICD-10-CM | POA: Diagnosis not present

## 2017-03-07 DIAGNOSIS — W260XXA Contact with knife, initial encounter: Secondary | ICD-10-CM | POA: Diagnosis not present

## 2017-03-07 MED ORDER — LIDOCAINE HCL (PF) 1 % IJ SOLN
5.0000 mL | Freq: Once | INTRAMUSCULAR | Status: AC
  Administered 2017-03-07: 5 mL
  Filled 2017-03-07: qty 5

## 2017-03-07 NOTE — ED Notes (Signed)
PT DISCHARGED. INSTRUCTIONS GIVEN. AAOX4. PT IN NO APPARENT DISTRESS WITH MILD PAIN. THE OPPORTUNITY TO ASK QUESTIONS WAS PROVIDED. 

## 2017-03-07 NOTE — Discharge Instructions (Signed)
Follow up with your doctor for suture removal. Return here as needed.

## 2017-03-07 NOTE — ED Provider Notes (Signed)
[facility]-EMERGENCY DEPT Provider Note   CSN: [id number] Arrival date & time: 03/07/17  1918     History   Chief Complaint Chief Complaint  Patient presents with  . Extremity Laceration    Right Index finger    HPI Sonia Drake is a 61 y.o. female who presents to the ED with a laceration to the left index finger. Patient reports she was holding butter in her left hand and trying to cut it with her right and the knife slipped and cut her finger. Patient states it felt like it went to the bone. Patient controlling bleeding with pressure. Patient is up to date on tetanus. HPI  Past Medical History:  Diagnosis Date  . Asthma   . COPD (chronic obstructive pulmonary disease) [facility](HCC)     Patient Active Problem List   Diagnosis Date Noted  . Lung nodule 02/04/2016  . COPD exacerbation (HCC) 07/21/2015  . Influenza B 04/23/2015  . Asthma exacerbation 04/22/2015  . Hypokalemia 04/22/2015  . 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 Influenza with pneumonia 07/03/2014  . Multiple lung nodules on CT 07/03/2014  . Asthma 07/02/2014  . Hyperglycemia 07/02/2014  . Chronic obstructive pulmonary disease with acute exacerbation (HCC)   . Prolonged QT interval 06/29/2014    History reviewed. No pertinent surgical history.  OB History    No data available       Home Medications    Prior to Admission medications   Medication Sig Start Date End Date Taking? Authorizing Provider  albuterol (PROVENTIL HFA;VENTOLIN HFA) 108 (90 Base) MCG/ACT inhaler Inhale 1-2 puffs into the lungs every 6 (six) hours as needed for wheezing or shortness of breath. 02/18/17   Dorothea OgleMyers, Iskra M, MD  budesonide-formoterol [facility](SYMBICORT) 160-4.5 MCG/ACT inhaler Inhale 2 puffs into the lungs 2 (two) times daily.    [provider]  calcium carbonate (OSCAL) 1500 (600 Ca) MG TABS tablet Take 600 mg of elemental calcium by mouth daily.    [provider]  cetirizine (ZYRTEC) 10  MG tablet Take 10 mg by mouth daily as needed for allergies.     [provider]  cholecalciferol (VITAMIN D) 1000 UNITS tablet Take 1,000 Units by mouth daily.    [provider]  doxycycline (VIBRA-TABS) 100 MG tablet Take 1 tablet (100 mg total) by mouth every 12 (twelve) hours. 02/18/17   Dorothea OgleMyers, Iskra M, MD  ipratropium-albuterol (DUONEB) 0.5-2.5 (3) MG/3ML SOLN Take 3 mLs by nebulization every 4 (four) hours as needed. 02/18/17   Dorothea OgleMyers, Iskra M, MD  montelukast (SINGULAIR) 10 MG tablet Take 10 mg by mouth at bedtime.    [provider]  Omega-3 Fatty Acids (FISH OIL) 1000 MG CAPS Take 1,000 mg by mouth daily.    [provider]  pantoprazole (PROTONIX) 20 MG tablet Take 20 mg by mouth daily as needed for indigestion.    [provider]  predniSONE (DELTASONE) 10 MG tablet Take prednisone 50 mg on 02/19/2017, taper down by 10 mg daily until completed. 02/18/17   Dorothea OgleMyers, Iskra M, MD  tiotropium (SPIRIVA) 18 MCG inhalation capsule Place 18 mcg into inhaler and inhale daily.    [provider]  triamcinolone lotion (KENALOG) 0.1 % Apply 1 application topically 2 (two) times daily. 08/29/15   [provider]    Family History Family History  Problem Relation Age of Onset  . Heart disease Mother   . Heart disease Father   . Diabetes Father   . Asthma  Father   . Diabetes Brother   . Diabetes Sister   . Asthma Brother   . Asthma Sister   . Hypertension Sister     Social History Social History   Tobacco Use  . Smoking status: Never Smoker  . Smokeless tobacco: Never Used  Substance Use Topics  . Alcohol use: No  . Drug use: No     Allergies   Contrast media [iodinated diagnostic agents]; Peanuts [peanut oil]; Shellfish allergy; Latex; Soap; and Eggs or egg-derived products   Review of Systems Review of Systems  Musculoskeletal: Positive for arthralgias.  Skin: Positive for wound.  All other systems reviewed and are  negative.    Physical Exam Updated Vital Signs BP 120/66 (BP Location: Right Arm)   Pulse 88   Temp 97.7 F (36.5 C) (Oral)   Resp 18   SpO2 98%   Physical Exam  Constitutional: She appears well-developed and well-nourished. No distress.  HENT:  Head: Normocephalic and atraumatic.  Eyes: EOM are normal.  Neck: Neck supple.  Cardiovascular: Normal rate.  Pulmonary/Chest: Effort normal.  Abdominal: Soft. There is no tenderness.  Musculoskeletal: Normal range of motion.       Left hand: She exhibits tenderness and laceration. She exhibits normal range of motion. Normal sensation noted. Normal strength noted. She exhibits no thumb/finger opposition.       Hands: Neurological: She is alert.  Skin: Skin is warm and dry.  Psychiatric: She has a normal mood and affect. Her behavior is normal.  Nursing note and vitals reviewed.    ED Treatments / Results  Labs (all labs ordered are listed, but only abnormal results are displayed) Labs Reviewed - No data to display Radiology Dg Finger Index Left  Result Date: 03/07/2017 CLINICAL DATA:  Laceration EXAM: LEFT INDEX FINGER 2+V COMPARISON:  None. FINDINGS: There is no evidence of fracture or dislocation. There is no evidence of arthropathy or other focal bone abnormality. Soft tissues are unremarkable. IMPRESSION: Negative. Electronically Signed   By: Jasmine PangKim  Fujinaga M.D.   On: 03/07/2017 22:20    Procedures .Marland Kitchen.Laceration Repair Date/Time: 03/07/2017 9:57 PM Performed by: Janne NapoleonNeese, Hope M, NP Authorized by: Janne NapoleonNeese, Hope M, NP   Consent:    Consent obtained:  Verbal   Consent given by:  Patient   Risks discussed:  Infection, pain and poor cosmetic result   Alternatives discussed:  No treatment Anesthesia (see MAR for exact dosages):    Anesthesia method:  Local infiltration   Local anesthetic:  Lidocaine 1% w/o epi Laceration details:    Location:  Finger   Finger location:  L index finger   Length (cm):  1.2 Repair type:     Repair type:  Simple Pre-procedure details:    Preparation:  Patient was prepped and draped in usual sterile fashion and imaging obtained to evaluate for foreign bodies Exploration:    Hemostasis achieved with:  Direct pressure   Wound exploration: wound explored through full range of motion and entire depth of wound probed and visualized     Contaminated: no   Treatment:    Area cleansed with:  Saline   Irrigation solution:  Sterile saline   Irrigation method:  Syringe Skin repair:    Repair method:  Sutures   Suture size:  4-0   Suture material:  Prolene   Number of sutures:  3 Approximation:    Approximation:  Close Post-procedure details:    Dressing:  Antibiotic ointment and sterile dressing   (including critical  care time)  Medications Ordered in ED Medications  lidocaine (PF) (XYLOCAINE) 1 % injection 5 mL (not administered)     Initial Impression / Assessment and Plan / ED Course  I have reviewed the triage vital signs and the nursing notes. 61 y.o. female with laceration to the left index finger stable for d/c without fracture or dislocation noted on x-ray and no focal neuro deficits. Patient to f/u in 7-10 days for suture removal or sooner for any problems.   Final Clinical Impressions(s) / ED Diagnoses   Final diagnoses:  Laceration of left index finger without foreign body without damage to nail, initial encounter    ED Discharge Orders    None       Kerrie [place], [place] 03/07/17 2235    Arby Barrette, MD 03/09/17 [phone]

## 2017-03-07 NOTE — ED Triage Notes (Signed)
Pt presents with left index finger lac that she reports sustaining from a butter knife. Last tetanus unknown.

## 2017-03-31 ENCOUNTER — Encounter (HOSPITAL_COMMUNITY): Payer: Self-pay

## 2017-03-31 ENCOUNTER — Other Ambulatory Visit (HOSPITAL_COMMUNITY): Payer: Self-pay | Admitting: *Deleted

## 2017-03-31 ENCOUNTER — Other Ambulatory Visit: Payer: Self-pay

## 2017-03-31 ENCOUNTER — Encounter (HOSPITAL_COMMUNITY)
Admission: RE | Admit: 2017-03-31 | Discharge: 2017-03-31 | Disposition: A | Discharge status: Home / Self Care | Payer: Medicaid Other | Source: Ambulatory Visit | Attending: Orthopedic Surgery | Admitting: Orthopedic Surgery

## 2017-03-31 DIAGNOSIS — Z01818 Encounter for other preprocedural examination: Secondary | ICD-10-CM | POA: Diagnosis not present

## 2017-03-31 DIAGNOSIS — Z01810 Encounter for preprocedural cardiovascular examination: Secondary | ICD-10-CM | POA: Insufficient documentation

## 2017-03-31 DIAGNOSIS — Z01812 Encounter for preprocedural laboratory examination: Secondary | ICD-10-CM | POA: Diagnosis present

## 2017-03-31 HISTORY — DX: Dyspnea, unspecified: R06.00

## 2017-03-31 HISTORY — DX: Heart failure, unspecified: I50.9

## 2017-03-31 HISTORY — DX: Gastro-esophageal reflux disease without esophagitis: K21.9

## 2017-03-31 HISTORY — DX: Unspecified convulsions: R56.9

## 2017-03-31 HISTORY — DX: Cardiac arrhythmia, unspecified: I49.9

## 2017-03-31 HISTORY — DX: Unspecified osteoarthritis, unspecified site: M19.90

## 2017-03-31 LAB — BASIC METABOLIC PANEL
Anion gap: 6 (ref 5–15)
BUN: <5 mg/dL — ABNORMAL LOW (ref 6–20)
CO2: 31 mmol/L (ref 22–32)
Calcium: 9.4 mg/dL (ref 8.9–10.3)
Chloride: 105 mmol/L (ref 101–111)
Creatinine, Ser: 0.86 mg/dL (ref 0.44–1.00)
GFR calc Af Amer: >60 mL/min (ref >60)
GFR calc non Af Amer: >60 mL/min (ref >60)
Glucose, Bld: 81 mg/dL (ref 65–99)
Potassium: 3.3 mmol/L — ABNORMAL LOW (ref 3.5–5.1)
Sodium: 142 mmol/L (ref 135–145)

## 2017-03-31 LAB — CBC WITH DIFFERENTIAL/PLATELET
Basophils Absolute: 0 10*3/uL (ref 0.0–0.1)
Basophils Relative: 1 %
Eosinophils Absolute: 0.4 10*3/uL (ref 0.0–0.7)
Eosinophils Relative: 7 %
HCT: 39.1 % (ref 36.0–46.0)
Hemoglobin: 12 g/dL (ref 12.0–15.0)
Lymphocytes Relative: 39 %
Lymphs Abs: 2.1 10*3/uL (ref 0.7–4.0)
MCH: 28.8 pg (ref 26.0–34.0)
MCHC: 30.7 g/dL (ref 30.0–36.0)
MCV: 93.8 fL (ref 78.0–100.0)
Monocytes Absolute: 0.4 10*3/uL (ref 0.1–1.0)
Monocytes Relative: 7 %
Neutro Abs: 2.5 10*3/uL (ref 1.7–7.7)
Neutrophils Relative %: 46 %
Platelets: 239 10*3/uL (ref 150–400)
RBC: 4.17 MIL/uL (ref 3.87–5.11)
RDW: 15.3 % (ref 11.5–15.5)
WBC: 5.4 10*3/uL (ref 4.0–10.5)

## 2017-03-31 LAB — URINALYSIS, ROUTINE W REFLEX MICROSCOPIC
Bilirubin Urine: NEGATIVE
Glucose, UA: NEGATIVE mg/dL
Hgb urine dipstick: NEGATIVE
Ketones, ur: NEGATIVE mg/dL
Leukocytes, UA: NEGATIVE
Nitrite: NEGATIVE
Protein, ur: NEGATIVE mg/dL
Specific Gravity, Urine: 1.016 (ref 1.005–1.030)
pH: 7 (ref 5.0–8.0)

## 2017-03-31 LAB — SURGICAL PCR SCREEN
MRSA, PCR: NEGATIVE
Staphylococcus aureus: NEGATIVE

## 2017-03-31 LAB — APTT: aPTT: 30 seconds (ref 24–36)

## 2017-03-31 LAB — PROTIME-INR
INR: 0.96
Prothrombin Time: 12.7 seconds (ref 11.4–15.2)

## 2017-03-31 NOTE — Progress Notes (Signed)
Message left on Raylene MiyamotoKathy Bloom Voicemail that we need clarification on consent order

## 2017-03-31 NOTE — Progress Notes (Signed)
OV, EKG, Stress test requested from Dr.Wayne Beauford. . in [place].

## 2017-03-31 NOTE — Pre-Procedure Instructions (Signed)
Niel HummerMary J Fojtik  03/31/2017      [facility] [place] [address] [place] [address] Phone: [phone] Fax: [phone]    Your procedure is scheduled on 04-07-2017  Monday   [facility] Admitting at 9:45 A.M.   Call this number if you have problems the morning of surgery:  [phone]   Remember:  Do not eat food or drink liquids after midnight.   Take these medicines the morning of surgery with A SIP OF WATER albuterol inhaler if needed,Symbicort inhaler,Pantoprazole(Protonix),Spiriva inhalation capsule,   STOP ASPIRIN,ANTIINFLAMATORIES (IBUPROFEN,ALEVE,MOTRIN,ADVIL,GOODY'S POWDERS),HERBAL SUPPLEMENTS,FISH OIL,AND VITAMINS 5-7 DAYS PRIOR TO SURGERY   Do not wear jewelry, make-up or nail polish.  Do not wear lotions, powders, or perfumes, or deodorant.  Do not shave 48 hours prior to surgery.  Men may shave face and neck.  Do not bring valuables to the hospital.  [facility] is not responsible for any belongings or valuables.  Contacts, dentures or bridgework may not be worn into surgery.  Leave your suitcase in the car.  After surgery it may be brought to your room.  For patients admitted to the hospital, discharge time will be determined by your treatment team.  Patients discharged the day of surgery will not be allowed to drive home.    Special Instructions: [facility] - Preparing for Surgery  Before surgery, you can play an important role.  Because skin is not sterile, your skin needs to be as free of germs as possible.  You can reduce the number of germs on you skin by washing with CHG (chlorahexidine gluconate) soap before surgery.  CHG is an antiseptic cleaner which kills germs and bonds with the skin to continue killing germs even after washing.  Please DO NOT use if you have an allergy to CHG or antibacterial soaps.  If your skin becomes reddened/irritated stop using the  CHG and inform your nurse when you arrive at Short Stay.  Do not shave (including legs and underarms) for at least 48 hours prior to the first CHG shower.  You may shave your face.  Please follow these instructions carefully:   1.  Shower with CHG Soap the night before surgery and the   morning of Surgery.  2.  If you choose to wash your hair, wash your hair first as usual with your normal shampoo.  3.  After you shampoo, rinse your hair and body thoroughly to remove the  Shampoo.  4.  Use CHG as you would any other liquid soap.  You can apply chg directly  to the skin and wash gently with scrungie or a clean washcloth.  5.  Apply the CHG Soap to your body ONLY FROM THE NECK DOWN.   Do not use on open wounds or open sores.  Avoid contact with your eyes,  ears, mouth and genitals (private parts).  Wash genitals (private parts) with your normal soap.  6.  Wash thoroughly, paying special attention to the area where your surgery will be performed.  7.  Thoroughly rinse your body with warm water from the neck down.  8.  DO NOT shower/wash with your normal soap after using and rinsing o  the CHG Soap.  9.  Pat yourself dry with a clean towel.            10.  Wear clean pajamas.  11.  Place clean sheets on your bed the night of your first shower and do not sleep with pets.  Day of Surgery  Do not apply any lotions/deodorants the morning of surgery.  Please wear clean clothes to the hospital/surgery center.   Please read over the following fact sheets that you were given. MRSA Information and Surgical Site Infection Prevention,incentive spirometry

## 2017-04-01 ENCOUNTER — Encounter (HOSPITAL_COMMUNITY): Payer: Self-pay

## 2017-04-01 NOTE — Progress Notes (Signed)
Anesthesia Chart Review: Patient is a 61 year old female scheduled for left THA 04/07/17 by Dr. Gean BirchwoodFrank Rowan. Case is posted for spinal anesthesia.  History includes never smoker, COPD, severe persistent asthma (frequent admissions), CHF (treated for fluid overload in the setting of asthma exacerbation ~ '98), dysrhythmia (occasional skipped beats), dyspnea, GERD, arthritis, seizure '03 (of unknown etiology). BMI not documented, but was 38 by 10/2016 pulmonary records.  - Admission 02/03/17-02/05/17 for asthma exacerbation. She was treated with IV Solu-Medrol, doxycycline and bronchodilators. - Admission 02/16/17-02/18/17 for asthma exacerbation. She was treated with prednisone taper, bronchodilators, and doxycycline.  PCP is Dr. Virl Sonammy Boyd with [facility] (Care Everywhere). Last visit 02/24/17 for hospital follow-up and preoperative evaluation. She wrote, "Preop examination  Goal is to optimize chronic medical problems prior to surgery. Towards that end I recommended the patient see her pulmonologist preoperative which she already plans to do."  Pulmonologist is Dr. Bethanie DickerWayne Beauford with [facility]. Patient thought she may have had a stress test there, but medical records indicate only an echo from [facility] from 09/2014 (see below). Dr. Eulis FosterBeauford felt she was "medium" risk from a pulmonary standpoint. His 10/21/16 note indicates that she has a stable 5 mm RLL lung nodules (stable for 2 years). She did not have 11/2016 PFTs. 09/2015 spirometry showed moderate-to-severe obstructive defect.   Meds include albuterol HFA, albuterol nebulizer, Symbicort, Salonpas, Singulair, Protonix, Spiriva.  BP 128/69   Pulse 91   Temp 36.7 C (Oral)   Resp 18   SpO2 98%   EKG 03/31/17: NSR, RSR prime or QR pattern in V1 suggest right ventricular conduction delay, T wave abnormality, consider inferior ischemia. Interpreting cardiologist felt there was no change since last  tracing 02/04/16.  Echo 10/10/14 (Care Everywhere; done during hospitalization for COPD exacerbation at [facility] ): Summary Mild concentric left ventricular hypertrophy Normal left ventricular systolic function with no appreciable segmental Abnormality. Estimated EF 55%. Trace tricuspid regurgitation There is a trivial pericardial effusion. (Comparison echo from 06/29/14 showed EF 60-65%, grade 2 diastolic dysfunction.)  CXR 03/31/17: IMPRESSION: Normal chest radiograph.  Spirometry 09/27/15 [facility]): Findings: Spirometry shows an FEV1 that is 56% of predicted. The ratio of FEV1 over forced vital capacity was 49% of predicted. Lung volumes are normal. Diffusion capacity is borderline but corrected with alveolar ventilation. Flow volume loop shows evidence of obstruction. Impression: Moderate to severe obstructive defect is noted.   Preoperative labs noted. K 3.3, glucose 81, Cr 0.86. CBC, PT/PTT, UA WNL.  I called and spoke with patient. She denied any recurrent asthma exacerbations since her 01/2017 hospitalization. She is compliant with her pulmonary regimen. She uses her albuterol nebulizer regularly, 3-4 times daily. She has multiple asthma triggers. She is able to lie flat, but needs hip support. She denied ever seeing a cardiologist. Reports during asthma exacerbation hospitalization around [place] in [place] that she was also being treated as if she had CHF because of fluid "backing up". She denied any recent edema. No chest pain. She does report a long history of occasional skipped beats since the '80's. Reportedly had a negative Holter monitor back then. She can notice them intermittently for days at a time, and if several can have some associated light headedness but denied syncope. Her activity is limited, particularly within the past six months due to her hip. She used to be able to walk about 10-15 minutes before hip/thigh pain would start, now pain is keeping her up  at  night. She would prefer spinal anesthesia. She is not on any blood thinners, denied previous back surgery, and no known significant valvular disease.   Above reviewed with anesthesiologist Dr. Daylene PoseyF. Hatchett. Patient's EKG appears stable for at least over two years. Echo in 2016 showed normal LVEF. She reports "skipped beats" have been present since the 80's. No recent edema. Denied chest pain. Severe persistent asthma places her at increased risk, but currently no exacerbation, and she is compliant with her medication regimen. Discussed that she should be at her baseline for surgery. If no acute changes then it is anticipated that she can proceed as planned.  Velna Ochsllison Franchesca Veneziano, PA-C [facility]/Anesthesiology Phone [phone] 04/01/2017 5:38 PM

## 2017-04-02 DIAGNOSIS — M1612 Unilateral primary osteoarthritis, left hip: Secondary | ICD-10-CM | POA: Diagnosis present

## 2017-04-02 NOTE — H&P (Signed)
TOTAL HIP ADMISSION H&P  Patient is admitted for left total hip arthroplasty.  Subjective:  Chief Complaint: left hip pain  HPI: Sonia Drake, 61 y.o. female, has a history of pain and functional disability in the left hip(s) due to arthritis and patient has failed non-surgical conservative treatments for greater than 12 weeks to include NSAID's and/or analgesics, use of assistive devices, weight reduction as appropriate and activity modification.  Onset of symptoms was gradual starting 2 years ago with gradually worsening course since that time.The patient noted no past surgery on the left hip(s).  Patient currently rates pain in the left hip at 10 out of 10 with activity. Patient has night pain, worsening of pain with activity and weight bearing, trendelenberg gait, pain that interfers with activities of daily living and pain with passive range of motion. Patient has evidence of subchondral cysts and joint space narrowing by imaging studies. This condition presents safety issues increasing the risk of falls.   There is no current active infection.  Patient Active Problem List   Diagnosis Date Noted  . Lung nodule 02/04/2016  . COPD exacerbation (HCC) 07/21/2015  . Influenza B 04/23/2015  . Asthma exacerbation 04/22/2015  . Hypokalemia 04/22/2015  . 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 Influenza with pneumonia 07/03/2014  . Multiple lung nodules on CT 07/03/2014  . Asthma 07/02/2014  . Hyperglycemia 07/02/2014  . Chronic obstructive pulmonary disease with acute exacerbation (HCC)   . Prolonged QT interval 06/29/2014   Past Medical History:  Diagnosis Date  . Arthritis   . Asthma   . CHF (congestive heart failure) (HCC)    reported treatment for fluid overload in the setting of asthma exacerbation ~ 1998  . COPD (chronic obstructive pulmonary disease) (HCC)   . Dyspnea   . Dysrhythmia    occasional skipped beats  . GERD (gastroesophageal reflux disease)   . Seizures (HCC) 2003    unknown cause    Past Surgical History:  Procedure Laterality Date  . arthroscopic knee surgery Right     No current facility-administered medications for this encounter.    Current Outpatient Medications  Medication Sig Dispense Refill Last Dose  . albuterol (PROVENTIL HFA;VENTOLIN HFA) 108 (90 Base) MCG/ACT inhaler Inhale 1-2 puffs into the lungs every 6 (six) hours as needed for wheezing or shortness of breath. (Patient taking differently: Inhale 2 puffs into the lungs every 6 (six) hours as needed for wheezing or shortness of breath. ) 1 Inhaler 3   . albuterol (PROVENTIL) (2.5 MG/3ML) 0.083% nebulizer solution Take 3 mLs by nebulization every 4 (four) hours as needed for wheezing or shortness of breath.      . budesonide-formoterol (SYMBICORT) 160-4.5 MCG/ACT inhaler Inhale 2 puffs into the lungs 2 (two) times daily.   02/16/2017 at Unknown time  . CALCIUM PO Take 1 tablet by mouth daily.     . Cholecalciferol (VITAMIN D3 PO) Take 1 tablet by mouth daily.     Marland Kitchen. ibuprofen (ADVIL,MOTRIN) 800 MG tablet Take 800 mg by mouth at bedtime.     . Liniments (SALONPAS PAIN RELIEF PATCH EX) Place 1 patch onto the skin daily as needed (applied to knee for pain.).     Marland Kitchen. montelukast (SINGULAIR) 10 MG tablet Take 10 mg by mouth at bedtime.   02/16/2017 at Unknown time  . pantoprazole (PROTONIX) 20 MG tablet Take 20 mg by mouth daily as needed for indigestion.   02/15/2017 at Unknown time  . tiotropium (SPIRIVA) 18 MCG inhalation capsule  Place 18 mcg into inhaler and inhale daily.    02/16/2017 at Unknown time  . triamcinolone lotion (KENALOG) 0.1 % Apply 1 application topically 2 (two) times daily as needed (for rash).    02/15/2017 at Unknown time  . doxycycline (VIBRA-TABS) 100 MG tablet Take 1 tablet (100 mg total) by mouth every 12 (twelve) hours. (Patient not taking: Reported on 03/27/2017) 8 tablet 0 Not Taking at Unknown time  . ipratropium-albuterol (DUONEB) 0.5-2.5 (3) MG/3ML SOLN Take 3 mLs by  nebulization every 4 (four) hours as needed. (Patient not taking: Reported on 03/27/2017) 360 mL 3 Not Taking at Unknown time  . predniSONE (DELTASONE) 10 MG tablet Take prednisone 50 mg on 02/19/2017, taper down by 10 mg daily until completed. (Patient not taking: Reported on 03/27/2017) 15 tablet 0 Not Taking at Unknown time   Allergies  Allergen Reactions  . Contrast Media [Iodinated Diagnostic Agents] Other (See Comments)    Cardiac arrest  . Peanuts [Peanut Oil] Shortness Of Breath and Swelling  . Shellfish Allergy Shortness Of Breath and Swelling  . Latex Hives  . Soap Other (See Comments)    Laundry detergent-swelling/shortness of breath.  . Eggs Or Egg-Derived Products Rash    Social History   Tobacco Use  . Smoking status: Never Smoker  . Smokeless tobacco: Never Used  Substance Use Topics  . Alcohol use: No    Family History  Problem Relation Age of Onset  . Heart disease Mother   . Heart disease Father   . Diabetes Father   . Asthma Father   . Diabetes Brother   . Diabetes Sister   . Asthma Brother   . Asthma Sister   . Hypertension Sister      Review of Systems  Constitutional: Positive for malaise/fatigue.  HENT: Positive for sinus pain.   Eyes: Negative.   Respiratory: Positive for shortness of breath.   Cardiovascular:       Irregular heart beat  Genitourinary: Negative.   Musculoskeletal: Positive for joint pain.  Skin: Positive for rash.  Neurological: Positive for focal weakness.  Endo/Heme/Allergies: Bruises/bleeds easily.  Psychiatric/Behavioral: Positive for depression.    Objective:  Physical Exam  Constitutional: She is oriented to person, place, and time. She appears well-developed and well-nourished.  HENT:  Head: Normocephalic and atraumatic.  Eyes: Pupils are equal, round, and reactive to light.  Neck: Normal range of motion. Neck supple.  Cardiovascular: Intact distal pulses.  Respiratory: Effort normal.  Musculoskeletal:  Left  hip is irritable to internal rotation and external rotation.  She walks with a left-sided limp.  Foot tap is negative.  Adequate quadriceps and hamstring power abductors and abductors are also intact.    Neurological: She is alert and oriented to person, place, and time.  Skin: Skin is warm and dry.  Psychiatric: She has a normal mood and affect. Her behavior is normal. Judgment and thought content normal.    Vital signs in last 24 hours:    Labs:   Estimated body mass index is 36.67 kg/m as calculated from the following:   Height as of 03/07/17: 5\' 3"  (1.6 m).   Weight as of 03/07/17: 93.9 kg (207 lb).   Imaging Review Plain x-rays are again reviewed showing moderate arthritis.  The MRI scan is reviewed showing advanced arthritis now with flattening of the femoral head.  There are also subchondral cysts in the acetabulum and marrow edema in the acetabulum.  Assessment/Plan:  End stage arthritis, left hip(s)  The  patient history, physical examination, clinical judgement of the provider and imaging studies are consistent with end stage degenerative joint disease of the left hip(s) and total hip arthroplasty is deemed medically necessary. The treatment options including medical management, injection therapy, arthroscopy and arthroplasty were discussed at length. The risks and benefits of total hip arthroplasty were presented and reviewed. The risks due to aseptic loosening, infection, stiffness, dislocation/subluxation,  thromboembolic complications and other imponderables were discussed.  The patient acknowledged the explanation, agreed to proceed with the plan and consent was signed. Patient is being admitted for inpatient treatment for surgery, pain control, PT, OT, prophylactic antibiotics, VTE prophylaxis, progressive ambulation and ADL's and discharge planning.The patient is planning to be discharged home with home health services.

## 2017-04-04 MED ORDER — CEFAZOLIN SODIUM-DEXTROSE 2-4 GM/100ML-% IV SOLN
2.0000 g | INTRAVENOUS | Status: AC
  Administered 2017-04-07 (×2): 2 g via INTRAVENOUS
  Filled 2017-04-04: qty 100

## 2017-04-04 MED ORDER — TRANEXAMIC ACID 1000 MG/10ML IV SOLN
2000.0000 mg | INTRAVENOUS | Status: AC
  Administered 2017-04-07: 2000 mg via TOPICAL
  Filled 2017-04-04: qty 20

## 2017-04-04 MED ORDER — TRANEXAMIC ACID 1000 MG/10ML IV SOLN
1000.0000 mg | INTRAVENOUS | Status: AC
  Administered 2017-04-07: 1000 mg via INTRAVENOUS
  Filled 2017-04-04: qty 1100

## 2017-04-04 MED ORDER — LACTATED RINGERS IV SOLN
INTRAVENOUS | Status: DC
  Administered 2017-04-07 (×2): via INTRAVENOUS

## 2017-04-04 MED ORDER — BUPIVACAINE LIPOSOME 1.3 % IJ SUSP
20.0000 mL | INTRAMUSCULAR | Status: AC
  Administered 2017-04-07: 20 mL
  Filled 2017-04-04 (×2): qty 20

## 2017-04-07 ENCOUNTER — Inpatient Hospital Stay (HOSPITAL_COMMUNITY)
Admission: RE | Admit: 2017-04-07 | Discharge: 2017-04-09 | DRG: 470 | Disposition: A | Discharge status: Home Health | Payer: Medicaid Other | Source: Ambulatory Visit | Attending: Orthopedic Surgery | Admitting: Orthopedic Surgery

## 2017-04-07 ENCOUNTER — Encounter (HOSPITAL_COMMUNITY)
Admission: RE | Disposition: A | Discharge status: Home Health | Payer: Self-pay | Source: Ambulatory Visit | Attending: Orthopedic Surgery

## 2017-04-07 ENCOUNTER — Other Ambulatory Visit: Payer: Self-pay

## 2017-04-07 ENCOUNTER — Inpatient Hospital Stay (HOSPITAL_COMMUNITY): Payer: Medicaid Other | Admitting: Certified Registered"

## 2017-04-07 ENCOUNTER — Inpatient Hospital Stay (HOSPITAL_COMMUNITY): Payer: Medicaid Other

## 2017-04-07 ENCOUNTER — Inpatient Hospital Stay (HOSPITAL_COMMUNITY): Payer: Medicaid Other | Admitting: Vascular Surgery

## 2017-04-07 ENCOUNTER — Encounter (HOSPITAL_COMMUNITY): Payer: Self-pay | Admitting: Certified Registered"

## 2017-04-07 DIAGNOSIS — Z6836 Body mass index (BMI) 36.0-36.9, adult: Secondary | ICD-10-CM

## 2017-04-07 DIAGNOSIS — J449 Chronic obstructive pulmonary disease, unspecified: Secondary | ICD-10-CM | POA: Diagnosis not present

## 2017-04-07 DIAGNOSIS — Z79899 Other long term (current) drug therapy: Secondary | ICD-10-CM

## 2017-04-07 DIAGNOSIS — Z825 Family history of asthma and other chronic lower respiratory diseases: Secondary | ICD-10-CM | POA: Diagnosis not present

## 2017-04-07 DIAGNOSIS — M1612 Unilateral primary osteoarthritis, left hip: Principal | ICD-10-CM | POA: Diagnosis present

## 2017-04-07 DIAGNOSIS — I509 Heart failure, unspecified: Secondary | ICD-10-CM | POA: Diagnosis present

## 2017-04-07 DIAGNOSIS — Z91041 Radiographic dye allergy status: Secondary | ICD-10-CM

## 2017-04-07 DIAGNOSIS — K219 Gastro-esophageal reflux disease without esophagitis: Secondary | ICD-10-CM | POA: Diagnosis not present

## 2017-04-07 DIAGNOSIS — Z91018 Allergy to other foods: Secondary | ICD-10-CM | POA: Diagnosis not present

## 2017-04-07 DIAGNOSIS — Z91040 Latex allergy status: Secondary | ICD-10-CM

## 2017-04-07 DIAGNOSIS — D62 Acute posthemorrhagic anemia: Secondary | ICD-10-CM | POA: Diagnosis not present

## 2017-04-07 DIAGNOSIS — Z7951 Long term (current) use of inhaled steroids: Secondary | ICD-10-CM

## 2017-04-07 DIAGNOSIS — M25552 Pain in left hip: Secondary | ICD-10-CM | POA: Diagnosis present

## 2017-04-07 DIAGNOSIS — E669 Obesity, unspecified: Secondary | ICD-10-CM | POA: Diagnosis present

## 2017-04-07 DIAGNOSIS — Z419 Encounter for procedure for purposes other than remedying health state, unspecified: Secondary | ICD-10-CM

## 2017-04-07 DIAGNOSIS — F329 Major depressive disorder, single episode, unspecified: Secondary | ICD-10-CM | POA: Diagnosis not present

## 2017-04-07 DIAGNOSIS — M1990 Unspecified osteoarthritis, unspecified site: Secondary | ICD-10-CM | POA: Diagnosis present

## 2017-04-07 DIAGNOSIS — Z91013 Allergy to seafood: Secondary | ICD-10-CM | POA: Diagnosis not present

## 2017-04-07 HISTORY — DX: Dependence on supplemental oxygen: Z99.81

## 2017-04-07 HISTORY — PX: TOTAL HIP ARTHROPLASTY: SHX124

## 2017-04-07 HISTORY — DX: Depression, unspecified: F32.A

## 2017-04-07 HISTORY — DX: Major depressive disorder, single episode, unspecified: F32.9

## 2017-04-07 HISTORY — DX: Pneumonia, unspecified organism: J18.9

## 2017-04-07 LAB — TYPE AND SCREEN
ABO/RH(D): O POS
Antibody Screen: NEGATIVE

## 2017-04-07 LAB — ABO/RH: ABO/RH(D): O POS

## 2017-04-07 SURGERY — ARTHROPLASTY, HIP, TOTAL, ANTERIOR APPROACH
Anesthesia: Spinal | Site: Hip | Laterality: Left

## 2017-04-07 MED ORDER — ONDANSETRON HCL 4 MG/2ML IJ SOLN
INTRAMUSCULAR | Status: DC | PRN
  Administered 2017-04-07: 4 mg via INTRAVENOUS

## 2017-04-07 MED ORDER — MOMETASONE FURO-FORMOTEROL FUM 200-5 MCG/ACT IN AERO
2.0000 | INHALATION_SPRAY | Freq: Two times a day (BID) | RESPIRATORY_TRACT | Status: DC
  Administered 2017-04-07 – 2017-04-09 (×4): 2 via RESPIRATORY_TRACT
  Filled 2017-04-07: qty 8.8

## 2017-04-07 MED ORDER — TIOTROPIUM BROMIDE MONOHYDRATE 18 MCG IN CAPS
18.0000 ug | ORAL_CAPSULE | Freq: Every day | RESPIRATORY_TRACT | Status: DC
  Administered 2017-04-08 – 2017-04-09 (×2): 18 ug via RESPIRATORY_TRACT
  Filled 2017-04-07: qty 5

## 2017-04-07 MED ORDER — PANTOPRAZOLE SODIUM 20 MG PO TBEC
20.0000 mg | DELAYED_RELEASE_TABLET | Freq: Every day | ORAL | Status: DC | PRN
  Administered 2017-04-08: 20 mg via ORAL
  Filled 2017-04-07: qty 1

## 2017-04-07 MED ORDER — METHOCARBAMOL 1000 MG/10ML IJ SOLN
500.0000 mg | Freq: Four times a day (QID) | INTRAMUSCULAR | Status: DC | PRN
  Filled 2017-04-07: qty 5

## 2017-04-07 MED ORDER — PROPOFOL 500 MG/50ML IV EMUL
INTRAVENOUS | Status: DC | PRN
  Administered 2017-04-07: 50 ug/kg/min via INTRAVENOUS

## 2017-04-07 MED ORDER — PHENYLEPHRINE HCL 10 MG/ML IJ SOLN
INTRAVENOUS | Status: DC | PRN
  Administered 2017-04-07: 50 ug/min via INTRAVENOUS

## 2017-04-07 MED ORDER — KCL IN DEXTROSE-NACL 20-5-0.45 MEQ/L-%-% IV SOLN
INTRAVENOUS | Status: DC
  Administered 2017-04-07 – 2017-04-09 (×3): via INTRAVENOUS
  Filled 2017-04-07 (×4): qty 1000

## 2017-04-07 MED ORDER — BUPIVACAINE-EPINEPHRINE 0.25% -1:200000 IJ SOLN
INTRAMUSCULAR | Status: DC | PRN
  Administered 2017-04-07: 50 mL

## 2017-04-07 MED ORDER — LIDOCAINE HCL (CARDIAC) 20 MG/ML IV SOLN
INTRAVENOUS | Status: DC | PRN
  Administered 2017-04-07: 20 mg via INTRAVENOUS

## 2017-04-07 MED ORDER — MIDAZOLAM HCL 2 MG/2ML IJ SOLN
INTRAMUSCULAR | Status: AC
  Filled 2017-04-07: qty 2

## 2017-04-07 MED ORDER — GABAPENTIN 300 MG PO CAPS
300.0000 mg | ORAL_CAPSULE | Freq: Three times a day (TID) | ORAL | Status: DC
  Administered 2017-04-07 – 2017-04-09 (×7): 300 mg via ORAL
  Filled 2017-04-07 (×7): qty 1

## 2017-04-07 MED ORDER — ALBUTEROL SULFATE HFA 108 (90 BASE) MCG/ACT IN AERS: 2.0000 | INHALATION_SPRAY | Freq: Four times a day (QID) | RESPIRATORY_TRACT | Status: DC | PRN

## 2017-04-07 MED ORDER — FLEET ENEMA 7-19 GM/118ML RE ENEM: 1.0000 | ENEMA | Freq: Once | RECTAL | Status: DC | PRN

## 2017-04-07 MED ORDER — MENTHOL 3 MG MT LOZG: 1.0000 | LOZENGE | OROMUCOSAL | Status: DC | PRN

## 2017-04-07 MED ORDER — DIPHENHYDRAMINE HCL 12.5 MG/5ML PO ELIX: 12.5000 mg | ORAL_SOLUTION | ORAL | Status: DC | PRN

## 2017-04-07 MED ORDER — ALUMINUM HYDROXIDE GEL 320 MG/5ML PO SUSP
15.0000 mL | ORAL | Status: DC | PRN
  Filled 2017-04-07: qty 30

## 2017-04-07 MED ORDER — PHENYLEPHRINE HCL 10 MG/ML IJ SOLN
INTRAMUSCULAR | Status: DC | PRN
  Administered 2017-04-07 (×3): 40 ug via INTRAVENOUS

## 2017-04-07 MED ORDER — MONTELUKAST SODIUM 10 MG PO TABS
10.0000 mg | ORAL_TABLET | Freq: Every day | ORAL | Status: DC
  Administered 2017-04-07 – 2017-04-08 (×2): 10 mg via ORAL
  Filled 2017-04-07 (×2): qty 1

## 2017-04-07 MED ORDER — CHLORHEXIDINE GLUCONATE 4 % EX LIQD: 60.0000 mL | Freq: Once | CUTANEOUS | Status: DC

## 2017-04-07 MED ORDER — PHENOL 1.4 % MT LIQD: 1.0000 | OROMUCOSAL | Status: DC | PRN

## 2017-04-07 MED ORDER — FENTANYL CITRATE (PF) 100 MCG/2ML IJ SOLN
INTRAMUSCULAR | Status: DC | PRN
  Administered 2017-04-07 (×2): 50 ug via INTRAVENOUS

## 2017-04-07 MED ORDER — METOCLOPRAMIDE HCL 5 MG/ML IJ SOLN: 5.0000 mg | Freq: Three times a day (TID) | INTRAMUSCULAR | Status: DC | PRN

## 2017-04-07 MED ORDER — ALBUTEROL SULFATE (2.5 MG/3ML) 0.083% IN NEBU
INHALATION_SOLUTION | RESPIRATORY_TRACT | Status: AC
  Filled 2017-04-07: qty 3

## 2017-04-07 MED ORDER — BISACODYL 5 MG PO TBEC
5.0000 mg | DELAYED_RELEASE_TABLET | Freq: Every day | ORAL | Status: DC | PRN
  Administered 2017-04-09: 5 mg via ORAL
  Filled 2017-04-07: qty 1

## 2017-04-07 MED ORDER — ONDANSETRON HCL 4 MG PO TABS: 4.0000 mg | ORAL_TABLET | Freq: Four times a day (QID) | ORAL | Status: DC | PRN

## 2017-04-07 MED ORDER — DEXAMETHASONE SODIUM PHOSPHATE 10 MG/ML IJ SOLN
10.0000 mg | Freq: Once | INTRAMUSCULAR | Status: AC
  Administered 2017-04-08: 10 mg via INTRAVENOUS
  Filled 2017-04-07: qty 1

## 2017-04-07 MED ORDER — BUPIVACAINE IN DEXTROSE 0.75-8.25 % IT SOLN
INTRATHECAL | Status: DC | PRN
  Administered 2017-04-07: 1.6 mL via INTRATHECAL

## 2017-04-07 MED ORDER — METHOCARBAMOL 500 MG PO TABS
ORAL_TABLET | ORAL | Status: AC
  Filled 2017-04-07: qty 1

## 2017-04-07 MED ORDER — METHOCARBAMOL 500 MG PO TABS
500.0000 mg | ORAL_TABLET | Freq: Four times a day (QID) | ORAL | Status: DC | PRN
  Administered 2017-04-07: 500 mg via ORAL

## 2017-04-07 MED ORDER — BUPIVACAINE-EPINEPHRINE 0.25% -1:200000 IJ SOLN
INTRAMUSCULAR | Status: AC
  Filled 2017-04-07: qty 1

## 2017-04-07 MED ORDER — OXYCODONE HCL 5 MG PO TABS: 10.0000 mg | ORAL_TABLET | ORAL | Status: DC | PRN

## 2017-04-07 MED ORDER — POLYETHYLENE GLYCOL 3350 17 G PO PACK
17.0000 g | PACK | Freq: Every day | ORAL | Status: DC | PRN
  Administered 2017-04-08 – 2017-04-09 (×2): 17 g via ORAL
  Filled 2017-04-07 (×2): qty 1

## 2017-04-07 MED ORDER — ALBUTEROL SULFATE (2.5 MG/3ML) 0.083% IN NEBU
3.0000 mL | INHALATION_SOLUTION | RESPIRATORY_TRACT | Status: DC | PRN
  Administered 2017-04-08 (×3): 3 mL via RESPIRATORY_TRACT
  Filled 2017-04-07 (×5): qty 3

## 2017-04-07 MED ORDER — ASPIRIN EC 325 MG PO TBEC
325.0000 mg | DELAYED_RELEASE_TABLET | Freq: Every day | ORAL | Status: DC
  Administered 2017-04-08 – 2017-04-09 (×2): 325 mg via ORAL
  Filled 2017-04-07 (×2): qty 1

## 2017-04-07 MED ORDER — OXYCODONE HCL 5 MG PO TABS
ORAL_TABLET | ORAL | Status: AC
  Filled 2017-04-07: qty 1

## 2017-04-07 MED ORDER — METOCLOPRAMIDE HCL 5 MG PO TABS: 5.0000 mg | ORAL_TABLET | Freq: Three times a day (TID) | ORAL | Status: DC | PRN

## 2017-04-07 MED ORDER — 0.9 % SODIUM CHLORIDE (POUR BTL) OPTIME
TOPICAL | Status: DC | PRN
  Administered 2017-04-07: 1000 mL

## 2017-04-07 MED ORDER — OXYCODONE HCL 5 MG PO TABS
5.0000 mg | ORAL_TABLET | ORAL | Status: DC | PRN
  Administered 2017-04-07 – 2017-04-08 (×2): 5 mg via ORAL
  Filled 2017-04-07: qty 1

## 2017-04-07 MED ORDER — ASPIRIN EC 325 MG PO TBEC: 325.0000 mg | DELAYED_RELEASE_TABLET | Freq: Two times a day (BID) | ORAL | 0 refills | Status: DC

## 2017-04-07 MED ORDER — METHOCARBAMOL 500 MG PO TABS
500.0000 mg | ORAL_TABLET | Freq: Two times a day (BID) | ORAL | 0 refills | Status: DC
Start: 2017-04-07 — End: 2017-09-20

## 2017-04-07 MED ORDER — MIDAZOLAM HCL 5 MG/5ML IJ SOLN
INTRAMUSCULAR | Status: DC | PRN
  Administered 2017-04-07: 2 mg via INTRAVENOUS

## 2017-04-07 MED ORDER — DOCUSATE SODIUM 100 MG PO CAPS
100.0000 mg | ORAL_CAPSULE | Freq: Two times a day (BID) | ORAL | Status: DC
  Administered 2017-04-07 – 2017-04-09 (×3): 100 mg via ORAL
  Filled 2017-04-07 (×3): qty 1

## 2017-04-07 MED ORDER — ONDANSETRON HCL 4 MG/2ML IJ SOLN
4.0000 mg | Freq: Four times a day (QID) | INTRAMUSCULAR | Status: DC | PRN
  Administered 2017-04-07: 4 mg via INTRAVENOUS
  Filled 2017-04-07: qty 2

## 2017-04-07 MED ORDER — CELECOXIB 200 MG PO CAPS
200.0000 mg | ORAL_CAPSULE | Freq: Two times a day (BID) | ORAL | Status: DC
  Administered 2017-04-07 – 2017-04-09 (×5): 200 mg via ORAL
  Filled 2017-04-07 (×5): qty 1

## 2017-04-07 MED ORDER — ACETAMINOPHEN 325 MG PO TABS: 650.0000 mg | ORAL_TABLET | ORAL | Status: DC | PRN

## 2017-04-07 MED ORDER — FENTANYL CITRATE (PF) 250 MCG/5ML IJ SOLN
INTRAMUSCULAR | Status: AC
  Filled 2017-04-07: qty 5

## 2017-04-07 MED ORDER — SODIUM CHLORIDE 0.9 % IV SOLN
1000.0000 mg | Freq: Once | INTRAVENOUS | Status: AC
  Administered 2017-04-07: 1000 mg via INTRAVENOUS
  Filled 2017-04-07: qty 10

## 2017-04-07 MED ORDER — OXYCODONE-ACETAMINOPHEN 5-325 MG PO TABS: 1.0000 | ORAL_TABLET | ORAL | 0 refills | Status: DC | PRN

## 2017-04-07 MED ORDER — ACETAMINOPHEN 650 MG RE SUPP: 650.0000 mg | RECTAL | Status: DC | PRN

## 2017-04-07 MED ORDER — HYDROMORPHONE HCL 1 MG/ML IJ SOLN
0.5000 mg | INTRAMUSCULAR | Status: DC | PRN
Start: 2017-04-07 — End: 2017-04-09
  Administered 2017-04-07: 0.5 mg via INTRAVENOUS
  Filled 2017-04-07: qty 1

## 2017-04-07 SURGICAL SUPPLY — 45 items
BAG DECANTER FOR FLEXI CONT (MISCELLANEOUS) ×3 IMPLANT
BLADE SAW SGTL 18X1.27X75 (BLADE) ×2 IMPLANT
BLADE SAW SGTL 18X1.27X75MM (BLADE) ×1
BLADE SURG 10 STRL SS (BLADE) ×2 IMPLANT
CAPT HIP TOTAL 2 ×2 IMPLANT
COVER PERINEAL POST (MISCELLANEOUS) ×3 IMPLANT
COVER SURGICAL LIGHT HANDLE (MISCELLANEOUS) ×3 IMPLANT
DRAPE C-ARM 42X72 X-RAY (DRAPES) ×3 IMPLANT
DRAPE STERI IOBAN 125X83 (DRAPES) ×3 IMPLANT
DRAPE U-SHAPE 47X51 STRL (DRAPES) ×6 IMPLANT
DRSG AQUACEL AG ADV 3.5X10 (GAUZE/BANDAGES/DRESSINGS) ×3 IMPLANT
DURAPREP 26ML APPLICATOR (WOUND CARE) ×3 IMPLANT
ELECT BLADE 4.0 EZ CLEAN MEGAD (MISCELLANEOUS) ×3
ELECT REM PT RETURN 9FT ADLT (ELECTROSURGICAL) ×3
ELECTRODE BLDE 4.0 EZ CLN MEGD (MISCELLANEOUS) ×1 IMPLANT
ELECTRODE REM PT RTRN 9FT ADLT (ELECTROSURGICAL) ×1 IMPLANT
FACESHIELD WRAPAROUND (MASK) ×6 IMPLANT
FACESHIELD WRAPAROUND OR TEAM (MASK) ×2 IMPLANT
GLOVE BIO SURGEON STRL SZ7.5 (GLOVE) ×3 IMPLANT
GLOVE BIO SURGEON STRL SZ8.5 (GLOVE) ×3 IMPLANT
GLOVE BIOGEL PI IND STRL 8 (GLOVE) ×1 IMPLANT
GLOVE BIOGEL PI IND STRL 9 (GLOVE) ×1 IMPLANT
GLOVE BIOGEL PI INDICATOR 8 (GLOVE) ×2
GLOVE BIOGEL PI INDICATOR 9 (GLOVE) ×2
GOWN STRL REUS W/ TWL LRG LVL3 (GOWN DISPOSABLE) ×1 IMPLANT
GOWN STRL REUS W/ TWL XL LVL3 (GOWN DISPOSABLE) ×2 IMPLANT
GOWN STRL REUS W/TWL LRG LVL3 (GOWN DISPOSABLE) ×3
GOWN STRL REUS W/TWL XL LVL3 (GOWN DISPOSABLE) ×6
KIT BASIN OR (CUSTOM PROCEDURE TRAY) ×3 IMPLANT
KIT ROOM TURNOVER OR (KITS) ×3 IMPLANT
MANIFOLD NEPTUNE II (INSTRUMENTS) ×3 IMPLANT
NEEDLE HYPO 22GX1.5 SAFETY (NEEDLE) ×6 IMPLANT
NS IRRIG 1000ML POUR BTL (IV SOLUTION) ×3 IMPLANT
PACK TOTAL JOINT (CUSTOM PROCEDURE TRAY) ×3 IMPLANT
PAD ARMBOARD 7.5X6 YLW CONV (MISCELLANEOUS) ×6 IMPLANT
SUT VIC AB 1 CTX 36 (SUTURE) ×6
SUT VIC AB 1 CTX36XBRD ANBCTR (SUTURE) ×1 IMPLANT
SUT VIC AB 2-0 CT1 27 (SUTURE) ×3
SUT VIC AB 2-0 CT1 TAPERPNT 27 (SUTURE) ×1 IMPLANT
SUT VIC AB 3-0 CT1 27 (SUTURE) ×3
SUT VIC AB 3-0 CT1 TAPERPNT 27 (SUTURE) ×1 IMPLANT
SYR CONTROL 10ML LL (SYRINGE) ×6 IMPLANT
TOWEL OR 17X24 6PK STRL BLUE (TOWEL DISPOSABLE) ×3 IMPLANT
TOWEL OR 17X26 10 PK STRL BLUE (TOWEL DISPOSABLE) ×3 IMPLANT
TRAY CATH 16FR W/PLASTIC CATH (SET/KITS/TRAYS/PACK) IMPLANT

## 2017-04-07 NOTE — Anesthesia Procedure Notes (Signed)
Spinal  Patient location during procedure: OR Start time: 04/07/2017 11:05 AM End time: 04/07/2017 11:15 AM Staffing Anesthesiologist: Sharee HolsterMassagee, Robynne Roat, MD Performed: anesthesiologist  Preanesthetic Checklist Completed: patient identified, site marked, surgical consent, pre-op evaluation, timeout performed, IV checked, risks and benefits discussed and monitors and equipment checked Spinal Block Patient position: sitting Prep: ChloraPrep and site prepped and draped Patient monitoring: continuous pulse ox, blood pressure and heart rate Approach: right paramedian Location: L3-4 Injection technique: single-shot Needle Needle type: Quincke  Needle gauge: 25 G Needle length: 9 cm Needle insertion depth: 7 cm Assessment Sensory level: T8 Additional Notes Challenging midline approach , went paramedian for access

## 2017-04-07 NOTE — Anesthesia Postprocedure Evaluation (Signed)
Anesthesia Post Note  Patient: Niel HummerMary J Renne  Procedure(s) Performed: LEFT TOTAL HIP ARTHROPLASTY ANTERIOR APPROACH (Left Hip)     Patient location during evaluation: PACU Anesthesia Type: Spinal Level of consciousness: oriented and awake and alert Pain management: pain level controlled Vital Signs Assessment: post-procedure vital signs reviewed and stable Respiratory status: spontaneous breathing, respiratory function stable and patient connected to nasal cannula oxygen Cardiovascular status: blood pressure returned to baseline and stable Postop Assessment: no headache, no backache and no apparent nausea or vomiting Anesthetic complications: no    Last Vitals:  Vitals:   04/07/17 1415 04/07/17 1441  BP:  128/61  Pulse: 70 72  Resp: (!) 22 20  Temp: (!) 36.4 C 36.8 C  SpO2: 97% 98%    Last Pain:  Vitals:   04/07/17 1441  TempSrc: Oral  PainSc:                  Amil Moseman,JAMES TERRILL

## 2017-04-07 NOTE — Anesthesia Preprocedure Evaluation (Addendum)
Anesthesia Evaluation  Patient identified by MRN, date of birth, ID band Patient awake    Reviewed: Allergy & Precautions, NPO status , Patient's Chart, lab work & pertinent test results  Airway Mallampati: II  TM Distance: >3 FB Neck ROM: Full    Dental  (+) Missing, Partial Lower, Partial Upper   Pulmonary shortness of breath, asthma , COPD,    breath sounds clear to auscultation       Cardiovascular +CHF  + dysrhythmias  Rhythm:Regular Rate:Normal     Neuro/Psych    GI/Hepatic GERD  ,  Endo/Other    Renal/GU      Musculoskeletal  (+) Arthritis ,   Abdominal (+) + obese,   Peds  Hematology   Anesthesia Other Findings   Reproductive/Obstetrics                            Anesthesia Physical Anesthesia Plan  ASA: III  Anesthesia Plan: Spinal   Post-op Pain Management:    Induction: Intravenous  PONV Risk Score and Plan: Treatment may vary due to age or medical condition  Airway Management Planned: Simple Face Mask  Additional Equipment:   Intra-op Plan:   Post-operative Plan:   Informed Consent: I have reviewed the patients History and Physical, chart, labs and discussed the procedure including the risks, benefits and alternatives for the proposed anesthesia with the patient or authorized representative who has indicated his/her understanding and acceptance.     Plan Discussed with:   Anesthesia Plan Comments:        Anesthesia Quick Evaluation

## 2017-04-07 NOTE — Op Note (Signed)
OPERATIVE REPORT    DATE OF PROCEDURE:  04/07/2017       PREOPERATIVE DIAGNOSIS:  LEFT HIP DEGENERATIVE JOINT DISEASE                                                          POSTOPERATIVE DIAGNOSIS:  LEFT HIP DEGENERATIVE JOINT DISEASE                                                           PROCEDURE: Anterior L total hip arthroplasty using a 48 mm DePuy Pinnacle  Cup, Peabody Energypex Hole Eliminator, 0-degree polyethylene liner, a +1.5 32 mm ceramic head, a 2 Hi Depuy Triloc stem   SURGEON: Nestor LewandowskyFrank J Alexiah Koroma    ASSISTANT:   Tomi LikensEric K. [employer] PA-C  (present throughout entire procedure and necessary for timely completion of the procedure)   ANESTHESIA: Spinal BLOOD LOSS: 350cc FLUID REPLACEMENT: 1500 crystalloid Antibiotic: 2gm ancef Tranexamic Acid: 1gm IV, 2gm Topical COMPLICATIONS: none    INDICATIONS FOR PROCEDURE: A [age] y.o. year-old With  LEFT HIP DEGENERATIVE JOINT DISEASE   for 4 years, x-rays show bone-on-bone arthritic changes, and osteophytes. Despite conservative measures with observation, anti-inflammatory medicine, narcotics, use of a cane, has severe unremitting pain and can ambulate only a few blocks before resting. Patient desires elective L total hip arthroplasty to decrease pain and increase function. The risks, benefits, and alternatives were discussed at length including but not limited to the risks of infection, bleeding, nerve injury, stiffness, blood clots, the need for revision surgery, cardiopulmonary complications, among others, and they were willing to proceed. Questions answered     PROCEDURE IN DETAIL: The patient was identified by armband,  received preoperative IV [facility], taken to the operating room , appropriate anesthetic monitors  were attached and  anesthesia was induced with the patienton the gurney. The HANA boots were applied to the feet and he was then transferred to the HANA table with a peroneal post and  support underneath the non-operative le, which was locked in 5 lb traction. Theoperative lower extremity was then prepped and draped in the usual sterile fashion from just above the iliac crest to the knee. And a timeout procedure was performed. We then made a 16 cm incision along the interval at the leading edge of the tensor fascia lata of starting at 2 cm lateral to and 2 cm distal to the ASIS. Small bleeders in the skin and subcutaneous tissue identified and cauterized we dissected down to the fascia and made an incision in the fascia allowing us to elevate the fascia of the tensor muscle and exploited the interval between the rectus and the tensor fascia lata. A Hohmann retractor was then placed along the superior neck of the femur and a Cobra retractor along the inferior neck of the femur we teed the capsule starting out at the superior anterior aspect of the acetabulum going distally and made the T along the neck both leaflets of the T were tagged with #2 Ethibond suture. Cobra retractors were then placed along the inferior and superior neck allowing us  to perform a standard neck cut and removed the femoral head with a power corkscrew. We then placed a right angle Hohmann retractor along the anterior aspect of the acetabulum a spiked Cobra in the cotyloid notch and posteriorly a Muelller retractor. We then sequentially reamed up to a 47 mm basket reamer obtaining good coverage in all quadrants, verified by C-arm imaging. Under C-arm control with and hammered into place a 48 mm Pinnacle cup in 45 of abduction and 15 of anteversion. The cup seated nicely and required no supplemental screws. We then placed a central hole Eliminator and a 0 polyethylene liner. The foot was then externally rotated to 110, the HANA elevator was placed around the flare of the greater trochanter and the limb was extended and abducted delivering the proximal femur up into the wound. A medium Hohmann retractor was placed over the  greater trochanter and a Mueller retractor along the posterior femoral neck completing the exposure. We then performed releases superiorly and and inferiorly of the capsule going back to the pirformis fossa superiorly and to the lesser trochanter inferiorly. We then entered the proximal femur with the box cutting offset chisel followed by, a canal sounder, the chili pepper and broaching up to a 2 broach. This seated nicely and we reamed the calcar. A trial reduction was performed with a 1.5 mm 48 mm head.The limb lengths were excellent the hip was stable in 90 of external rotation. At this point the trial components removed and we hammered into place a # 2 Hi  Offset Tri-Lock stem with Gryption coating. A + 1.5 32 mm ceramic ball was then hammered into place the hip was reduced and final C-arm images obtained. The wound was thoroughly irrigated with normal saline solution. We repaired the ant capsule and the tensor fascia lot a with running 0 vicryl suture. the subcutaneous tissue was closed with 2-0 and 3-0 Vicryl suture followed by an Aquacil dressing. At this point the patient was awaken and transferred to hospital gurney without difficulty. The subcutaneous tissue with 0 and 2-0 undyed Vicryl suture and the skin with running  3-0 vicryl subcuticular suture. Aquacil dressing was applied. The patient was then unclamped, rolled supine, awaken extubated and taken to recovery room without difficulty in stable condition.   Nestor LewandowskyFrank J Donnesha Karg 04/07/2017, 12:23 PM

## 2017-04-07 NOTE — Transfer of Care (Signed)
Immediate Anesthesia Transfer of Care Note  Patient: Niel HummerMary J Bruemmer  Procedure(s) Performed: LEFT TOTAL HIP ARTHROPLASTY ANTERIOR APPROACH (Left Hip)  Patient Location: PACU  Anesthesia Type:Spinal  Level of Consciousness: awake, alert  and oriented  Airway & Oxygen Therapy: Patient Spontanous Breathing and Patient connected to face mask oxygen  Post-op Assessment: Report given to RN and Post -op Vital signs reviewed and stable  Post vital signs: Reviewed and stable  Last Vitals:  Vitals:   04/07/17 1030 04/07/17 1043  BP:  139/63  Pulse:  87  Resp:  20  Temp: 36.8 C   SpO2:  98%    Last Pain:  Vitals:   04/07/17 1034  PainSc: 10-Worst pain ever         Complications: No apparent anesthesia complications

## 2017-04-07 NOTE — Progress Notes (Signed)
Attempt to call report - will give bedside report

## 2017-04-07 NOTE — Discharge Instructions (Signed)

## 2017-04-07 NOTE — Progress Notes (Signed)
PT Cancellation Note  Patient Details Name: Sonia Drake MRN: [id number] DOB: 30-Nov-1955   Cancelled Treatment:    Reason Eval/Treat Not Completed: Medical issues which prohibited therapy Pt currently getting breathing treatment after getting light headed following mobility. Will check back as schedule allows.   Gladys DammeBrittany [name], PT, DPT  Acute Rehabilitation Services  Pager: [phone]  Lehman PromBrittany S Kasaundra Fahrney 04/07/2017, 4:59 PM

## 2017-04-07 NOTE — Interval H&P Note (Signed)
History and Physical Interval Note:  04/07/2017 10:46 AM  Sonia Drake  has presented today for surgery, with the diagnosis of LEFT HIP DEGENERATIVE JOINT DISEASE  The various methods of treatment have been discussed with the patient and family. After consideration of risks, benefits and other options for treatment, the patient has consented to  Procedure(s): LEFT TOTAL HIP ARTHROPLASTY ANTERIOR APPROACH (Left) as a surgical intervention .  The patient's history has been reviewed, patient examined, no change in status, stable for surgery.  I have reviewed the patient's chart and labs.  Questions were answered to the patient's satisfaction.     Nestor LewandowskyFrank J Kester Stimpson

## 2017-04-08 ENCOUNTER — Encounter (HOSPITAL_COMMUNITY): Payer: Self-pay | Admitting: Orthopedic Surgery

## 2017-04-08 LAB — CBC
HCT: 33.8 % — ABNORMAL LOW (ref 36.0–46.0)
Hemoglobin: 10.5 g/dL — ABNORMAL LOW (ref 12.0–15.0)
MCH: 28.8 pg (ref 26.0–34.0)
MCHC: 31.1 g/dL (ref 30.0–36.0)
MCV: 92.9 fL (ref 78.0–100.0)
Platelets: 191 10*3/uL (ref 150–400)
RBC: 3.64 MIL/uL — ABNORMAL LOW (ref 3.87–5.11)
RDW: 14.8 % (ref 11.5–15.5)
WBC: 8.4 10*3/uL (ref 4.0–10.5)

## 2017-04-08 LAB — BASIC METABOLIC PANEL
Anion gap: 6 (ref 5–15)
BUN: <5 mg/dL — ABNORMAL LOW (ref 6–20)
CO2: 32 mmol/L (ref 22–32)
Calcium: 9 mg/dL (ref 8.9–10.3)
Chloride: 102 mmol/L (ref 101–111)
Creatinine, Ser: 0.72 mg/dL (ref 0.44–1.00)
GFR calc Af Amer: >60 mL/min (ref >60)
GFR calc non Af Amer: >60 mL/min (ref >60)
Glucose, Bld: 120 mg/dL — ABNORMAL HIGH (ref 65–99)
Potassium: 3.3 mmol/L — ABNORMAL LOW (ref 3.5–5.1)
Sodium: 140 mmol/L (ref 135–145)

## 2017-04-08 NOTE — Evaluation (Signed)
Physical Therapy Evaluation Patient Details Name: Sonia Drake MRN: [id number] DOB: 01/25/1956 Today's Date: 04/08/2017   History of Present IllnesNiel Drake  61 y.o. female, has a history of pain and functional disability in the left hip(s) due to arthritis s/p L THA 04/07/17. PMH includes COPD, CHF, and seizures  Clinical Impression  Pt is s/p THA resulting in the deficits listed below (see PT Problem List). Pt is currently min A-min guard for transfers and min guard for ambulation of 175 ft with RW.  Pt will benefit from skilled PT to increase their independence and safety with mobility to allow discharge to the venue listed below.      Follow Up Recommendations DC plan and follow up therapy as arranged by surgeon;Home health PT;Supervision/Assistance - 24 hour    Equipment Recommendations  Other (comment)(pt requesting Rollator and Lift chair)    Recommendations for Other Services       Precautions / Restrictions Precautions Precautions: Fall Restrictions Weight Bearing Restrictions: Yes LLE Weight Bearing: Weight bearing as tolerated      Mobility  Bed Mobility               General bed mobility comments: OOB in chair on entry  Transfers Overall transfer level: Needs assistance Equipment used: Rolling walker (2 wheeled) Transfers: Sit to/from Stand Sit to Stand: Min assist;Min guard         General transfer comment: minA for power up from recliner, [facility]   Ambulation/Gait Ambulation/Gait assistance: Min guard Ambulation Distance (Feet): 175 Feet Assistive device: Rolling walker (2 wheeled) Gait Pattern/deviations: Step-through pattern;Decreased stance time - left;Decreased step length - right;Shuffle;Antalgic;Trunk flexed Gait velocity: slowed Gait velocity interpretation: Below normal speed for age/gender General Gait Details: min guard for safety, vc for sequencing, proximity with RW, upright posture, increased L knee flexion, pt  required multiple standing restbreaks due to SoB      Balance Overall balance assessment: Needs assistance Sitting-balance support: No upper extremity supported;Feet supported Sitting balance-[name]: Good     Standing balance support: During functional activity;No upper extremity supported Standing balance-[name]: Good Standing balance comment: able to perform pericare without assist                             Pertinent Vitals/Pain Pain Assessment: 0-10 Pain Score: 3  Pain Location: L hip surgical site Pain Descriptors / Indicators: Aching;Burning;Constant;Operative site guarding Pain Intervention(s): Limited activity within patient's tolerance;Monitored during session;Repositioned    Home Living Family/patient expects to be discharged to:: Private residence Living Arrangements: Children   Type of Home: House Home Access: Stairs to enter   Secretary/administratorntrance Stairs-Number of Steps: 1 Home Layout: One level Home Equipment: Environmental consultantWalker - 2 wheels;Bedside commode;Tub bench      Prior Function Level of Independence: Needs assistance   Gait / Transfers Assistance Needed: ambulates with RW household distances   ADL's / Homemaking Assistance Needed: has CNA 7 days a week to help with ADLs, iADLs           Extremity/Trunk Assessment   Upper Extremity Assessment Upper Extremity Assessment: Overall WFL for tasks assessed    Lower Extremity Assessment Lower Extremity Assessment: LLE deficits/detail LLE Deficits / Details: L hip ROM limited, knee and ankle WFL LLE: Unable to fully assess due to pain       Communication   Communication: No difficulties  Cognition Arousal/Alertness: Awake/alert Behavior During Therapy: WFL for tasks assessed/performed  Overall Cognitive Status: No family/caregiver present to determine baseline cognitive functioning                                        General Comments General comments (skin integrity,  edema, etc.): Pt with c/o of SoB with ambulation SaO2 on RA >91% throughout ambulation , HR max 108 bpm    Exercises Total Joint Exercises Long Arc Quad: AROM;10 reps;Left;Seated   Assessment/Plan    PT Assessment Patient needs continued PT services  PT Problem List Decreased strength;Decreased range of motion;Decreased activity tolerance;Decreased balance;Decreased mobility;Decreased knowledge of use of DME;Decreased safety awareness;Pain       PT Treatment Interventions DME instruction;Gait training;Functional mobility training;Therapeutic activities;Therapeutic exercise;Balance training;Patient/family education    PT Goals (Current goals can be found in the Care Plan section)  Acute Rehab PT Goals Patient Stated Goal: go home PT Goal Formulation: With patient Time For Goal Achievement: 04/22/17 Potential to Achieve Goals: Good    Frequency 7X/week    AM-PAC PT "6 Clicks" Daily Activity  Outcome Measure Difficulty turning over in bed (including adjusting bedclothes, sheets and blankets)?: A Lot Difficulty moving from lying on back to sitting on the side of the bed? : Unable Difficulty sitting down on and standing up from a chair with arms (e.g., wheelchair, bedside commode, etc,.)?: Unable Help needed moving to and from a bed to chair (including a wheelchair)?: A Little Help needed walking in hospital room?: A Little Help needed climbing 3-5 steps with a railing? : A Lot 6 Click Score: 12    End of Session Equipment Utilized During Treatment: Gait belt Activity Tolerance: Patient tolerated treatment well Patient left: in chair;with call bell/phone within reach Nurse Communication: Mobility status PT Visit Diagnosis: Unsteadiness on feet (R26.81);Other abnormalities of gait and mobility (R26.89);Muscle weakness (generalized) (M62.81);Difficulty in walking, not elsewhere classified (R26.2);Pain Pain - Right/Left: Left Pain - part of body: Hip    Time: [id number] PT Time  Calculation (min) (ACUTE ONLY): 44 min   Charges:   PT Evaluation $PT Eval Moderate Complexity: 1 Mod PT Treatments $Gait Training: 8-22 mins $Therapeutic Activity: 8-22 mins   PT G Codes:        Sonia Drake. Beverely RisenVan Drake PT, DPT Acute Rehabilitation  [phone] Pager [phone]    Sonia Drake 04/08/2017, 1:18 PM

## 2017-04-08 NOTE — Progress Notes (Signed)
PATIENT ID: Niel HummerMary J Depaolis  MRN: [id number]  DOB/AGE:  63-29-1957 / 63 y.o.  1 Day Post-Op Procedure(s) (LRB): LEFT TOTAL HIP ARTHROPLASTY ANTERIOR APPROACH (Left)    PROGRESS NOTE Subjective: Patient is alert, oriented, x1 Nausea, no Vomiting, yes passing gas. Taking PO well. Denies SOB, Chest or Calf Pain. Using Incentive Spirometer, PAS in place. Ambulate walked in room without difficulty slept well last night, Patient reports pain as 5/10 .    Objective: Vital signs in last 24 hours: Vitals:   04/07/17 1441 04/07/17 2028 04/07/17 2312 04/08/17 0225  BP: 128/61  (!) 101/54 (!) 110/56  Pulse: 72  83 80  Resp: 20  19 18   Temp: 98.2 F (36.8 C)  98.9 F (37.2 C) 98 F (36.7 C)  TempSrc: Oral  Oral   SpO2: 98% 97% 96% 97%  Weight: 205 lb (93 kg)     Height: 5\' 3"  (1.6 m)         Intake/Output from previous day: I/O last 3 completed shifts: In: 2034 [P.O.:684; I.V.:1350] Out: 700 [Urine:350; Blood:350]   Intake/Output this shift: Total I/O In: 600 [P.O.:600] Out: 450 [Urine:450]   LABORATORY DATA: Recent Labs    04/08/17 0454  WBC 8.4  HGB 10.5*  HCT 33.8*  PLT 191  NA 140  K 3.3*  CL 102  CO2 32  BUN <5*  CREATININE 0.72  GLUCOSE 120*  CALCIUM 9.0    Examination: Neurologically intact ABD soft Neurovascular intact Sensation intact distally Intact pulses distally Dorsiflexion/Plantar flexion intact Incision: dressing C/D/I No cellulitis present Compartment soft}  Assessment:   1 Day Post-Op Procedure(s) (LRB): LEFT TOTAL HIP ARTHROPLASTY ANTERIOR APPROACH (Left) ADDITIONAL DIAGNOSIS: Expected Acute Blood Loss Anemia, obesity  Plan: PT/OT WBAT, AROM and PROM  DVT Prophylaxis:  SCDx72hrs, ASA 325 mg BID x 2 weeks DISCHARGE PLAN: Home, today if patient passes all PT goals DISCHARGE NEEDS: HHPT, Walker and 3-in-1 comode seat     Nestor LewandowskyFrank J Lenette Rau 04/08/2017, 6:23 AM

## 2017-04-08 NOTE — Progress Notes (Signed)
Physical Therapy Treatment Patient Details Name: Sonia HummerMary J Drake MRN: [id number] DOB: 1956/02/20 Today's Date: 04/08/2017    History of Present Illness 61 y.o. female, has a history of pain and functional disability in the left hip(s) due to arthritis s/p L THA 04/07/17. PMH includes COPD, CHF, and seizures    PT Comments    Pt is making progress towards her goals, however is limited in her mobility by L hip pain and SoB with activity. Pt is currently min guard for transfers and min guard for ambulation of 550 feet with RW. Pt able to perform standing hip exercises provided in surgical hip exercises handout. D/c plan remains appropriate for pt. PT will continue to follow acutely.    Follow Up Recommendations  DC plan and follow up therapy as arranged by surgeon;Home health PT;Supervision/Assistance - 24 hour     Equipment Recommendations  Other (comment)(pt requesting Rollator and Lift chair)       Precautions / Restrictions Precautions Precautions: Fall Restrictions Weight Bearing Restrictions: Yes LLE Weight Bearing: Weight bearing as tolerated    Mobility  Bed Mobility               General bed mobility comments: OOB in chair on entry  Transfers Overall transfer level: Needs assistance Equipment used: Rolling walker (2 wheeled) Transfers: Sit to/from Stand Sit to Stand: Min guard         General transfer comment: min guard for power up from recliner  Ambulation/Gait Ambulation/Gait assistance: Min guard Ambulation Distance (Feet): 550 Feet Assistive device: Rolling walker (2 wheeled) Gait Pattern/deviations: Step-through pattern;Decreased stance time - left;Decreased step length - right;Shuffle;Antalgic;Trunk flexed Gait velocity: slowed Gait velocity interpretation: Below normal speed for age/gender General Gait Details: min guard for safety, vc for proximity with RW, and upright posture. Pt required multiple standing restbreaks due to SoB        Balance Overall balance assessment: Needs assistance Sitting-balance support: No upper extremity supported;Feet supported Sitting balance-[name]: Good     Standing balance support: During functional activity;No upper extremity supported Standing balance-[name]: Good Standing balance comment: able to perform pericare without assist                            Cognition Arousal/Alertness: Awake/alert Behavior During Therapy: WFL for tasks assessed/performed Overall Cognitive Status: No family/caregiver present to determine baseline cognitive functioning                                        Exercises Total Joint Exercises Hip ABduction/ADduction: AROM;Left;Standing Long Arc Quad: AROM;10 reps;Left;Seated Knee Flexion: AROM;10 reps;Left;Standing Marching in Standing: AROM;Both;10 reps;Standing Standing Hip Extension: AROM;10 reps;Left    General Comments General comments (skin integrity, edema, etc.): Pt continues to have c/o SoB, SaO2 on RA maintained above 91% O2 throughout session      Pertinent Vitals/Pain Pain Assessment: 0-10 Pain Score: 3  Pain Location: L hip surgical site Pain Descriptors / Indicators: Aching;Burning;Constant;Operative site guarding Pain Intervention(s): Limited activity within patient's tolerance;Monitored during session           PT Goals (current goals can now be found in the care plan section) Acute Rehab PT Goals Patient Stated Goal: go home PT Goal Formulation: With patient Time For Goal Achievement: 04/22/17 Potential to Achieve Goals: Good    Frequency    7X/week  PT Plan          AM-PAC PT "6 Clicks" Daily Activity  Outcome Measure  Difficulty turning over in bed (including adjusting bedclothes, sheets and blankets)?: A Lot Difficulty moving from lying on back to sitting on the side of the bed? : Unable Difficulty sitting down on and standing up from a chair with arms (e.g.,  wheelchair, bedside commode, etc,.)?: Unable Help needed moving to and from a bed to chair (including a wheelchair)?: A Little Help needed walking in hospital room?: A Little Help needed climbing 3-5 steps with a railing? : A Lot 6 Click Score: 12    End of Session Equipment Utilized During Treatment: Gait belt Activity Tolerance: Patient tolerated treatment well Patient left: in chair;with call bell/phone within reach Nurse Communication: Mobility status PT Visit Diagnosis: Unsteadiness on feet (R26.81);Other abnormalities of gait and mobility (R26.89);Muscle weakness (generalized) (M62.81);Difficulty in walking, not elsewhere classified (R26.2);Pain Pain - Right/Left: Left Pain - part of body: Hip     Time: [id number] PT Time Calculation (min) (ACUTE ONLY): 27 min  Charges:  $Gait Training: 8-22 mins $Therapeutic Activity: 8-22 mins                    G Codes:       Garima Chronis B. Beverely RisenVan Fleet PT, DPT Acute Rehabilitation  [phone] Pager [phone]     [name] 04/08/2017, 5:38 PM

## 2017-04-08 NOTE — Care Management Note (Addendum)
Case Management Note  Patient Details  Name: Niel HummerMary J Virden MRN: [id number] Date of Birth: 16-Dec-1955  Subjective/Objective:                    Action/Plan: Renee with Dr Turner Danielsowan does not want patient to have rollator ( safety issues) and she should not need lift chair for the surgery he performed. Patient and bedside nurse aware.  Discussed discharge planning with patient at bedside. Patient already has 3 in 1 and rolling walker at home.   Patient requesting Rollator walker with seat and motorized recliner lift chair.  Explained NCM will ask MD for orders. Patient will need to take order for motorized reclincer lift chair to a medical supply store. Patient in agreement and voices understanding.  Spoke with Renee CM at Dr Wadie Lessenowan's office . She will ask Dr Turner Danielsowan to sign DME orders. Expected Discharge Date:                  Expected Discharge Plan:  Home w Home Health Services  In-House Referral:     Discharge planning Services  CM Consult  Post Acute Care Choice:  Durable Medical Equipment, Home Health Choice offered to:  Patient  DME Arranged:  Walker rolling with seat DME Agency:  [facility]  HH Arranged:  PT [facility]  Status of Service:  In process, will continue to follow  If discussed at Long Length of Stay Meetings, dates discussed:    Additional Comments:  Kingsley PlanWile, Ashiah Karpowicz Marie, RN 04/08/2017, 12:28 PM

## 2017-04-09 LAB — CBC
HCT: 34.1 % — ABNORMAL LOW (ref 36.0–46.0)
Hemoglobin: 10.5 g/dL — ABNORMAL LOW (ref 12.0–15.0)
MCH: 28.8 pg (ref 26.0–34.0)
MCHC: 30.8 g/dL (ref 30.0–36.0)
MCV: 93.7 fL (ref 78.0–100.0)
Platelets: 200 10*3/uL (ref 150–400)
RBC: 3.64 MIL/uL — ABNORMAL LOW (ref 3.87–5.11)
RDW: 14.8 % (ref 11.5–15.5)
WBC: 11.7 10*3/uL — ABNORMAL HIGH (ref 4.0–10.5)

## 2017-04-09 NOTE — Progress Notes (Signed)
Physical Therapy Treatment Patient Details Name: Niel HummerMary J Speltz MRN: [id number] DOB: [birth year] Today's Date: 04/09/2017    History of Present Illness 61 y.o. female, has a history of pain and functional disability in the left hip(s) due to arthritis s/p L THA 04/07/17. PMH includes COPD, CHF, and seizures    PT Comments    Pt is showing good progress towards goals. She ambulated in hall 700 ft with supervision for safety. Pt required several rest breaks during ambulation secondary to SOB. Reviewed seated HEP and hip precautions. Pt able to recall 2/3 anterior hip precautions. Plan to negotiate 1 step this PM to prepare for safe d/c home.    Follow Up Recommendations  DC plan and follow up therapy as arranged by surgeon;Home health PT;Supervision/Assistance - 24 hour     Equipment Recommendations  Other (comment)(pt requesting Rollator and Lift chair)    Recommendations for Other Services       Precautions / Restrictions Precautions Precautions: Fall Restrictions Weight Bearing Restrictions: Yes LLE Weight Bearing: Weight bearing as tolerated    Mobility  Bed Mobility Overal bed mobility: Modified Independent             General bed mobility comments: No assist needed. Increased time and effort.  Transfers Overall transfer level: Needs assistance Equipment used: Rolling walker (2 wheeled) Transfers: Sit to/from Stand Sit to Stand: Min assist         General transfer comment: min A to rise up from bed and cueing for hand placement.  Ambulation/Gait Ambulation/Gait assistance: Supervision Ambulation Distance (Feet): 700 Feet Assistive device: Rolling walker (2 wheeled) Gait Pattern/deviations: Step-through pattern;Decreased stance time - left;Decreased step length - right;Antalgic Gait velocity: slowed   General Gait Details: Pt with steady gait and good technique with RW. Mildly antalgic gait secondary to post op pain. Pt requiring multiple standing rest  breaks secondary to SOB.    Stairs            Wheelchair Mobility    Modified Rankin (Stroke Patients Only)       Balance Overall balance assessment: Needs assistance Sitting-balance support: No upper extremity supported;Feet supported Sitting balance-[name]: Good     Standing balance support: During functional activity;No upper extremity supported Standing balance-[name]: Good Standing balance comment: able to perform pericare without assist                            Cognition Arousal/Alertness: Awake/alert Behavior During Therapy: WFL for tasks assessed/performed Overall Cognitive Status: No family/caregiver present to determine baseline cognitive functioning                                        Exercises Total Joint Exercises Long Arc Quad: AROM;Left;15 reps;Seated    General Comments        Pertinent Vitals/Pain Pain Assessment: 0-10 Pain Score: 2  Pain Location: L hip surgical site Pain Descriptors / Indicators: Aching;Burning;Constant;Operative site guarding Pain Intervention(s): Monitored during session;Repositioned    Home Living                      Prior Function            PT Goals (current goals can now be found in the care plan section) Acute Rehab PT Goals Patient Stated Goal: go home PT Goal Formulation: With patient Time For  Goal Achievement: 04/22/17 Potential to Achieve Goals: Good Progress towards PT goals: Progressing toward goals    Frequency    7X/week      PT Plan Current plan remains appropriate    Co-evaluation              AM-PAC PT "6 Clicks" Daily Activity  Outcome Measure  Difficulty turning over in bed (including adjusting bedclothes, sheets and blankets)?: None Difficulty moving from lying on back to sitting on the side of the bed? : None Difficulty sitting down on and standing up from a chair with arms (e.g., wheelchair, bedside commode, etc,.)?:  Unable Help needed moving to and from a bed to chair (including a wheelchair)?: A Little Help needed walking in hospital room?: A Little Help needed climbing 3-5 steps with a railing? : A Lot 6 Click Score: 17    End of Session Equipment Utilized During Treatment: Gait belt Activity Tolerance: Patient tolerated treatment well Patient left: in chair;with call bell/phone within reach Nurse Communication: Mobility status PT Visit Diagnosis: Unsteadiness on feet (R26.81);Other abnormalities of gait and mobility (R26.89);Muscle weakness (generalized) (M62.81);Difficulty in walking, not elsewhere classified (R26.2);Pain Pain - Right/Left: Left Pain - part of body: Hip     Time: [id number] PT Time Calculation (min) (ACUTE ONLY): 29 min  Charges:  $Gait Training: 23-37 mins                    G Codes:       Kallie LocksHannah Marybella Ethier, [place] Pager [phone] Acute Rehab   Sheral ApleyHannah E Vian Fluegel 04/09/2017, 10:20 AM

## 2017-04-09 NOTE — Progress Notes (Signed)
Physical Therapy Treatment Patient Details Name: Sonia HummerMary J Drake MRN: [id number] DOB: 09-Mar-1956 Today's Date: 04/09/2017    History of Present Illness 61 y.o. female, has a history of pain and functional disability in the left hip(s) due to arthritis s/p L THA 04/07/17. PMH includes COPD, CHF, and seizures    PT Comments    This afternoon's session focused on stair training. Pt able to negotiate 1 platform step with min guard for safety. Pt very anxious about returning home and standing from chair with out arm rests. Had pt practice sit<>stand 6x with cueing for technique. Pt able to progress from min A to min guard for transfer with hands in lap. Pt provided with written instructions for sit>stand and stair negotiation. Pt would benefit from continued skilled PT to return to PLOF and increase functional independence. Expected to d/c with HHPT to follow up.   Follow Up Recommendations  DC plan and follow up therapy as arranged by surgeon;Home health PT;Supervision/Assistance - 24 hour     Equipment Recommendations  Other (comment)(pt requesting Rollator and Lift chair)    Recommendations for Other Services       Precautions / Restrictions Precautions Precautions: Fall Restrictions Weight Bearing Restrictions: Yes LLE Weight Bearing: Weight bearing as tolerated    Mobility  Bed Mobility Overal bed mobility: Modified Independent             General bed mobility comments: in chair on arrival  Transfers Overall transfer level: Needs assistance Equipment used: Rolling walker (2 wheeled) Transfers: Sit to/from Stand(x6) Sit to Stand: Min assist;Min guard         General transfer comment: Pt anxious about standing w/o arm rests. Had pt practice sit<>stand with cueing for technqiue. Pt able to progress from Min A to min guard to stand w/o use of arm rests.  Ambulation/Gait Ambulation/Gait assistance: Supervision Ambulation Distance (Feet): 550 Feet Assistive device:  Rolling walker (2 wheeled) Gait Pattern/deviations: Step-through pattern;Decreased stance time - left;Decreased step length - right;Antalgic Gait velocity: slowed   General Gait Details: Pt with steady gait and good technique with RW. Mildly antalgic gait secondary to post op pain. Pt requiring multiple standing rest breaks secondary to SOB.    Stairs Stairs: Yes   Stair Management: No rails;Step to pattern;Forwards;Backwards;With walker Number of Stairs: 2 General stair comments: pt with 1 platform step to enter home. Reviewed negotiating step forwards or backwards with RW. Gave pt written instructions for "up with the strong, down with the weak"  Wheelchair Mobility    Modified Rankin (Stroke Patients Only)       Balance Overall balance assessment: Needs assistance Sitting-balance support: No upper extremity supported;Feet supported Sitting balance-[name]: Good     Standing balance support: During functional activity;No upper extremity supported Standing balance-[name]: Good                              Cognition Arousal/Alertness: Awake/alert Behavior During Therapy: WFL for tasks assessed/performed Overall Cognitive Status: No family/caregiver present to determine baseline cognitive functioning                                        Exercises      General Comments        Pertinent Vitals/Pain Pain Assessment: Faces Faces Pain Scale: Hurts a little bit Pain Location: L hip surgical  site Pain Descriptors / Indicators: Aching;Burning;Constant;Operative site guarding Pain Intervention(s): Monitored during session    Home Living                      Prior Function            PT Goals (current goals can now be found in the care plan section) Acute Rehab PT Goals Patient Stated Goal: go home PT Goal Formulation: With patient Time For Goal Achievement: 04/22/17 Potential to Achieve Goals: Good     Frequency    7X/week      PT Plan Current plan remains appropriate    Co-evaluation              AM-PAC PT "6 Clicks" Daily Activity  Outcome Measure  Difficulty turning over in bed (including adjusting bedclothes, sheets and blankets)?: None Difficulty moving from lying on back to sitting on the side of the bed? : None Difficulty sitting down on and standing up from a chair with arms (e.g., wheelchair, bedside commode, etc,.)?: Unable Help needed moving to and from a bed to chair (including a wheelchair)?: A Little Help needed walking in hospital room?: A Little Help needed climbing 3-5 steps with a railing? : A Little 6 Click Score: 18    End of Session Equipment Utilized During Treatment: Gait belt Activity Tolerance: Patient tolerated treatment well Patient left: in chair;with call bell/phone within reach Nurse Communication: Mobility status PT Visit Diagnosis: Unsteadiness on feet (R26.81);Other abnormalities of gait and mobility (R26.89);Muscle weakness (generalized) (M62.81);Difficulty in walking, not elsewhere classified (R26.2);Pain Pain - Right/Left: Left Pain - part of body: Hip     Time: [id number] PT Time Calculation (min) (ACUTE ONLY): 27 min  Charges:  $Gait Training: 23-37 mins                    G Codes:       Kallie LocksHannah Savilla Drake, [place] Pager [phone] Acute Rehab    Sonia ApleyHannah E Honesty Drake 04/09/2017, 3:49 PM

## 2017-04-09 NOTE — Progress Notes (Signed)
PATIENT ID: Sonia HummerMary J Drake  MRN: [id number]  DOB/AGE:  09/23/1955 / 61 y.o.  2 Days Post-Op Procedure(s) (LRB): LEFT TOTAL HIP ARTHROPLASTY ANTERIOR APPROACH (Left)    PROGRESS NOTE Subjective: Patient is alert, oriented, no Nausea, no Vomiting, yes passing gas, . Taking PO well. Denies SOB, Chest or Calf Pain. Using Incentive Spirometer, PAS in place. Ambulate WBAT with pt walking 600 ft with therapy  Patient reports pain as  mild .    Objective: Vital signs in last 24 hours: Vitals:   04/08/17 2025 04/08/17 2056 04/09/17 0625 04/09/17 0843  BP:  (!) 115/51 (!) 108/56   Pulse:  87 84 86  Resp:  17 18 18   Temp:  98.3 F (36.8 C) 98.3 F (36.8 C)   TempSrc:  Oral    SpO2: 94% 92% 93% 93%  Weight:      Height:          Intake/Output from previous day: I/O last 3 completed shifts: In: 3800 [P.O.:1550; I.V.:2250] Out: 1550 [Urine:1550]   Intake/Output this shift: Total I/O In: -  Out: 600 [Urine:600]   LABORATORY DATA: Recent Labs    04/08/17 0454 04/09/17 0410  WBC 8.4 11.7*  HGB 10.5* 10.5*  HCT 33.8* 34.1*  PLT 191 200  NA 140  --   K 3.3*  --   CL 102  --   CO2 32  --   BUN <5*  --   CREATININE 0.72  --   GLUCOSE 120*  --   CALCIUM 9.0  --     Examination: Neurologically intact Neurovascular intact Sensation intact distally Intact pulses distally Dorsiflexion/Plantar flexion intact Incision: dressing C/D/I and no drainage No cellulitis present Compartment soft} XR AP&Lat of hip shows well placed\fixed THA  Assessment:   2 Days Post-Op Procedure(s) (LRB): LEFT TOTAL HIP ARTHROPLASTY ANTERIOR APPROACH (Left) ADDITIONAL DIAGNOSIS:  Expected Acute Blood Loss Anemia, Obesity  Plan: PT/OT WBAT, THA  DVT Prophylaxis: SCDx72 hrs, ASA 325 mg BID x 2 weeks  DISCHARGE PLAN: Home  DISCHARGE NEEDS: HHPT, Walker and 3-in-1 comode seat

## 2017-04-09 NOTE — Progress Notes (Signed)
Reviewed discharge instructions with the patient, and her daughter. Pt asked appropriate questions, and were answered by the RN with Pt's satisfaction.  Pt was given discharge instructions and prescriptions. Pt left via w/c with family off to front of hospital.

## 2017-04-09 NOTE — Discharge Summary (Signed)
Patient ID: Sonia Drake MRN: [id number] DOB/AGE: 11-08-1955 61 y.o.  Admit date: 04/07/2017 Discharge date: 04/09/2017  Admission Diagnoses:  Principal Problem:   Osteoarthritis of left hip Active Problems:   Primary osteoarthritis of left hip   Discharge Diagnoses:  Same  Past Medical History:  Diagnosis Date  . Arthritis    "right knee; left hip" (04/07/2017)  . Asthma   . CHF (congestive heart failure) (HCC)    reported treatment for fluid overload in the setting of asthma exacerbation ~ 1998  . COPD (chronic obstructive pulmonary disease) (HCC)   . 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 Dyspnea   . Dysrhythmia    occasional skipped beats  . GERD (gastroesophageal reflux disease)   . On home oxygen therapy    "I sleep with 2L" (04/07/2017)  . Pneumonia    "several times" (04/07/2017)  . Seizures (HCC) 2003 X 1   unknown cause    Surgeries: Procedure(s): LEFT TOTAL HIP ARTHROPLASTY ANTERIOR APPROACH on 04/07/2017   Consultants:   Discharged Condition: Improved  Hospital Course: Sonia HummerMary J Drake is an 61 y.o. female who was admitted 04/07/2017 for operative treatment ofOsteoarthritis of left hip. Patient has severe unremitting pain that affects sleep, daily activities, and work/hobbies. After pre-op clearance the patient was taken to the operating room on 04/07/2017 and underwent  Procedure(s): LEFT TOTAL HIP ARTHROPLASTY ANTERIOR APPROACH.    Patient was given perioperative antibiotics:  Anti-infectives (From admission, onward)   Start     Dose/Rate Route Frequency Ordered Stop   04/07/17 1000  ceFAZolin (ANCEF) IVPB 2g/100 mL premix     2 g 200 mL/hr over 30 Minutes Intravenous To ShortStay Surgical 04/04/17 1757 04/07/17 1115       Patient was given sequential compression devices, early ambulation, and chemoprophylaxis to prevent DVT.  Patient benefited maximally from hospital stay and there were no complications.    Recent vital  signs:  Patient Vitals for the past 24 hrs:  BP Temp Temp src Pulse Resp SpO2  04/09/17 0843 - - - 86 18 93 %  04/09/17 0625 (!) 108/56 98.3 F (36.8 C) - 84 18 93 %  04/08/17 2056 (!) 115/51 98.3 F (36.8 C) Oral 87 17 92 %  04/08/17 2025 - - - - - 94 %     Recent laboratory studies:  Recent Labs    04/08/17 0454 04/09/17 0410  WBC 8.4 11.7*  HGB 10.5* 10.5*  HCT 33.8* 34.1*  PLT 191 200  NA 140  --   K 3.3*  --   CL 102  --   CO2 32  --   BUN <5*  --   CREATININE 0.72  --   GLUCOSE 120*  --   CALCIUM 9.0  --      Discharge Medications:   Allergies as of 04/09/2017      Reactions   Contrast Media [iodinated Diagnostic Agents] Anaphylaxis, Other (See Comments)   ** CARDIAC ARREST **   Peanuts [peanut Oil] Shortness Of Breath, Swelling   Shellfish Allergy Shortness Of Breath, Swelling   Soap Shortness Of Breath, Swelling   LAUNDRY DETERGENT   Latex Hives   Eggs Or Egg-derived Products Rash      Medication List    STOP taking these medications   doxycycline 100 MG tablet Commonly known as:  VIBRA-TABS   ibuprofen 800 MG tablet Commonly known as:  ADVIL,MOTRIN   ipratropium-albuterol 0.5-2.5 (3) MG/3ML Soln Commonly known as:  DUONEB   predniSONE 10 MG tablet Commonly known as:  DELTASONE     TAKE these medications   albuterol (2.5 MG/3ML) 0.083% nebulizer solution Commonly known as:  PROVENTIL Take 3 mLs by nebulization every 4 (four) hours as needed for wheezing or shortness of breath. What changed:  Another medication with the same name was changed. Make sure you understand how and when to take each.   albuterol 108 (90 Base) MCG/ACT inhaler Commonly known as:  PROVENTIL HFA;VENTOLIN HFA Inhale 1-2 puffs into the lungs every 6 (six) hours as needed for wheezing or shortness of breath. What changed:  how much to take   aspirin EC 325 MG tablet Take 1 tablet (325 mg total) by mouth 2 (two) times daily.   budesonide-formoterol 160-4.5 MCG/ACT  inhaler Commonly known as:  SYMBICORT Inhale 2 puffs into the lungs 2 (two) times daily.   CALCIUM PO Take 1 tablet by mouth daily.   methocarbamol 500 MG tablet Commonly known as:  ROBAXIN Take 1 tablet (500 mg total) by mouth 2 (two) times daily with a meal.   montelukast 10 MG tablet Commonly known as:  SINGULAIR Take 10 mg by mouth at bedtime.   oxyCODONE-acetaminophen 5-325 MG tablet Commonly known as:  ROXICET Take 1 tablet by mouth every 4 (four) hours as needed.   pantoprazole 20 MG tablet Commonly known as:  PROTONIX Take 20 mg by mouth daily as needed for indigestion.   SALONPAS PAIN RELIEF PATCH EX Place 1 patch onto the skin daily as needed (applied to knee for pain.).   tiotropium 18 MCG inhalation capsule Commonly known as:  SPIRIVA Place 18 mcg into inhaler and inhale daily.   triamcinolone lotion 0.1 % Commonly known as:  KENALOG Apply 1 application topically 2 (two) times daily as needed (for rash).   VITAMIN D3 PO Take 1 tablet by mouth daily.            Durable Medical Equipment  (From admission, onward)        Start     Ordered   04/07/17 1431  DME Walker rolling  Once    Question:  Patient needs a walker to treat with the following condition  Answer:  Status post total hip replacement, left   04/07/17 1430   04/07/17 1431  DME 3 n 1  Once     04/07/17 1430   04/07/17 1431  DME Bedside commode  Once    Question:  Patient needs a bedside commode to treat with the following condition  Answer:  Status post total hip replacement, left   04/07/17 1430      Diagnostic Studies: Dg Chest 2 View  Result Date: 03/31/2017 CLINICAL DATA:  Preoperative radiograph for of the total hip arthroplasty chest radiograph 02/16/2017 EXAM: CHEST  2 VIEW COMPARISON:  None. FINDINGS: The heart size and mediastinal contours are within normal limits. Both lungs are clear. The visualized skeletal structures are unremarkable. IMPRESSION: Normal chest radiograph.  Electronically Signed   By: Deatra Robinson M.D.   On: 03/31/2017 15:27   Dg C-arm 1-60 Min  Result Date: 04/07/2017 CLINICAL DATA:  Intraoperative imaging for left hip replacement. EXAM: OPERATIVE LEFT HIP (WITH PELVIS IF PERFORMED) 1 VIEWS TECHNIQUE: Fluoroscopic spot image(s) were submitted for interpretation post-operatively. COMPARISON:  None. FINDINGS: Single fluoroscopic intraoperative image demonstrates a left total hip arthroplasty is in place. The device is located. No fracture. IMPRESSION: Intraoperative imaging for left hip replacement.  No acute finding. Electronically Signed  By: Drusilla Kannerhomas  Dalessio M.D.   On: 04/07/2017 13:01   Dg Hip Operative Unilat W Or W/o Pelvis Left  Result Date: 04/07/2017 CLINICAL DATA:  Intraoperative imaging for left hip replacement. EXAM: OPERATIVE LEFT HIP (WITH PELVIS IF PERFORMED) 1 VIEWS TECHNIQUE: Fluoroscopic spot image(s) were submitted for interpretation post-operatively. COMPARISON:  None. FINDINGS: Single fluoroscopic intraoperative image demonstrates a left total hip arthroplasty is in place. The device is located. No fracture. IMPRESSION: Intraoperative imaging for left hip replacement.  No acute finding. Electronically Signed   By: Drusilla Kannerhomas  Dalessio M.D.   On: 04/07/2017 13:01    Disposition: 01-Home or Self Care  Discharge Instructions    Call MD / Call 911   Complete by:  As directed    If you experience chest pain or shortness of breath, CALL 911 and be transported to the hospital emergency room.  If you develope a fever above 101 F, pus (white drainage) or increased drainage or redness at the wound, or calf pain, call your surgeon's office.   Constipation Prevention   Complete by:  As directed    Drink plenty of fluids.  Prune juice may be helpful.  You may use a stool softener, such as Colace (over the counter) 100 mg twice a day.  Use MiraLax (over the counter) for constipation as needed.   Diet - low sodium heart healthy   Complete by:   As directed    Driving restrictions   Complete by:  As directed    No driving for 2 weeks   Follow the hip precautions as taught in Physical Therapy   Complete by:  As directed    Increase activity slowly as tolerated   Complete by:  As directed    Patient may shower   Complete by:  As directed    You may shower without a dressing once there is no drainage.  Do not wash over the wound.  If drainage remains, cover wound with plastic wrap and then shower.      Follow-up Information    Gean Birchwoodowan, Frank, MD Follow up in 2 week(s).   Specialty:  Orthopedic Surgery Contact information: [address] [place] [address] [phone]            Signed: Dannielle Burnric Mazin Emma 04/09/2017, 2:33 PM

## 2017-09-19 ENCOUNTER — Encounter (HOSPITAL_COMMUNITY): Payer: Self-pay

## 2017-09-19 ENCOUNTER — Emergency Department (HOSPITAL_COMMUNITY): Payer: Medicaid Other

## 2017-09-19 ENCOUNTER — Observation Stay (HOSPITAL_COMMUNITY)
Admission: EM | Admit: 2017-09-19 | Discharge: 2017-09-20 | Disposition: A | Discharge status: Home / Self Care | Payer: Medicaid Other | Attending: Internal Medicine | Admitting: Internal Medicine

## 2017-09-19 ENCOUNTER — Other Ambulatory Visit: Payer: Self-pay

## 2017-09-19 DIAGNOSIS — R000 Tachycardia, unspecified: Secondary | ICD-10-CM | POA: Diagnosis not present

## 2017-09-19 DIAGNOSIS — J441 Chronic obstructive pulmonary disease with (acute) exacerbation: Secondary | ICD-10-CM | POA: Diagnosis not present

## 2017-09-19 DIAGNOSIS — J4541 Moderate persistent asthma with (acute) exacerbation: Secondary | ICD-10-CM | POA: Diagnosis not present

## 2017-09-19 DIAGNOSIS — J45901 Unspecified asthma with (acute) exacerbation: Secondary | ICD-10-CM | POA: Diagnosis present

## 2017-09-19 DIAGNOSIS — Z7951 Long term (current) use of inhaled steroids: Secondary | ICD-10-CM | POA: Diagnosis not present

## 2017-09-19 DIAGNOSIS — R05 Cough: Secondary | ICD-10-CM | POA: Diagnosis present

## 2017-09-19 DIAGNOSIS — J9611 Chronic respiratory failure with hypoxia: Secondary | ICD-10-CM | POA: Diagnosis not present

## 2017-09-19 DIAGNOSIS — J449 Chronic obstructive pulmonary disease, unspecified: Secondary | ICD-10-CM | POA: Insufficient documentation

## 2017-09-19 DIAGNOSIS — I5032 Chronic diastolic (congestive) heart failure: Secondary | ICD-10-CM | POA: Diagnosis not present

## 2017-09-19 DIAGNOSIS — Z9981 Dependence on supplemental oxygen: Secondary | ICD-10-CM | POA: Insufficient documentation

## 2017-09-19 DIAGNOSIS — Z79899 Other long term (current) drug therapy: Secondary | ICD-10-CM | POA: Insufficient documentation

## 2017-09-19 DIAGNOSIS — Z96642 Presence of left artificial hip joint: Secondary | ICD-10-CM | POA: Diagnosis not present

## 2017-09-19 DIAGNOSIS — Z7982 Long term (current) use of aspirin: Secondary | ICD-10-CM | POA: Insufficient documentation

## 2017-09-19 DIAGNOSIS — K219 Gastro-esophageal reflux disease without esophagitis: Secondary | ICD-10-CM | POA: Insufficient documentation

## 2017-09-19 LAB — CBC WITH DIFFERENTIAL/PLATELET
Basophils Absolute: 0 10*3/uL (ref 0.0–0.1)
Basophils Relative: 1 %
Eosinophils Absolute: 0.1 10*3/uL (ref 0.0–0.7)
Eosinophils Relative: 1 %
HCT: 44 % (ref 36.0–46.0)
Hemoglobin: 13.6 g/dL (ref 12.0–15.0)
Lymphocytes Relative: 38 %
Lymphs Abs: 1.6 10*3/uL (ref 0.7–4.0)
MCH: 27.9 pg (ref 26.0–34.0)
MCHC: 30.9 g/dL (ref 30.0–36.0)
MCV: 90.3 fL (ref 78.0–100.0)
Monocytes Absolute: 0.3 10*3/uL (ref 0.1–1.0)
Monocytes Relative: 8 %
Neutro Abs: 2.3 10*3/uL (ref 1.7–7.7)
Neutrophils Relative %: 52 %
Platelets: 210 10*3/uL (ref 150–400)
RBC: 4.87 MIL/uL (ref 3.87–5.11)
RDW: 15.7 % — ABNORMAL HIGH (ref 11.5–15.5)
WBC: 4.3 10*3/uL (ref 4.0–10.5)

## 2017-09-19 LAB — BASIC METABOLIC PANEL
Anion gap: 7 (ref 5–15)
BUN: 9 mg/dL (ref 6–20)
CO2: 29 mmol/L (ref 22–32)
Calcium: 10.2 mg/dL (ref 8.9–10.3)
Chloride: 101 mmol/L (ref 101–111)
Creatinine, Ser: 0.75 mg/dL (ref 0.44–1.00)
GFR calc Af Amer: >60 mL/min (ref >60)
GFR calc non Af Amer: >60 mL/min (ref >60)
Glucose, Bld: 161 mg/dL — ABNORMAL HIGH (ref 65–99)
Potassium: 3.7 mmol/L (ref 3.5–5.1)
Sodium: 137 mmol/L (ref 135–145)

## 2017-09-19 MED ORDER — ALBUTEROL SULFATE (2.5 MG/3ML) 0.083% IN NEBU
5.0000 mg | INHALATION_SOLUTION | Freq: Once | RESPIRATORY_TRACT | Status: AC
  Administered 2017-09-19: 5 mg via RESPIRATORY_TRACT
  Filled 2017-09-19: qty 6

## 2017-09-19 MED ORDER — GUAIFENESIN ER 600 MG PO TB12
1200.0000 mg | ORAL_TABLET | Freq: Two times a day (BID) | ORAL | Status: DC
  Administered 2017-09-19: 1200 mg via ORAL
  Filled 2017-09-19: qty 2

## 2017-09-19 MED ORDER — IPRATROPIUM-ALBUTEROL 0.5-2.5 (3) MG/3ML IN SOLN
3.0000 mL | Freq: Once | RESPIRATORY_TRACT | Status: AC
  Administered 2017-09-19: 3 mL via RESPIRATORY_TRACT
  Filled 2017-09-19: qty 3

## 2017-09-19 MED ORDER — MAGNESIUM SULFATE 2 GM/50ML IV SOLN
2.0000 g | Freq: Once | INTRAVENOUS | Status: AC
  Administered 2017-09-19: 2 g via INTRAVENOUS
  Filled 2017-09-19: qty 50

## 2017-09-19 MED ORDER — ALBUTEROL (5 MG/ML) CONTINUOUS INHALATION SOLN
10.0000 mg/h | INHALATION_SOLUTION | RESPIRATORY_TRACT | Status: DC
  Administered 2017-09-19: 10 mg/h via RESPIRATORY_TRACT
  Filled 2017-09-19: qty 20

## 2017-09-19 MED ORDER — ENOXAPARIN SODIUM 40 MG/0.4ML ~~LOC~~ SOLN: 40.0000 mg | SUBCUTANEOUS | Status: DC

## 2017-09-19 MED ORDER — ENOXAPARIN SODIUM 40 MG/0.4ML ~~LOC~~ SOLN
40.0000 mg | SUBCUTANEOUS | Status: DC
  Filled 2017-09-19: qty 0.4

## 2017-09-19 MED ORDER — ALBUTEROL SULFATE (2.5 MG/3ML) 0.083% IN NEBU: 2.5000 mg | INHALATION_SOLUTION | RESPIRATORY_TRACT | Status: DC

## 2017-09-19 MED ORDER — METHYLPREDNISOLONE SODIUM SUCC 125 MG IJ SOLR
60.0000 mg | Freq: Two times a day (BID) | INTRAMUSCULAR | Status: DC
  Administered 2017-09-19 – 2017-09-20 (×2): 60 mg via INTRAVENOUS
  Filled 2017-09-19 (×2): qty 2

## 2017-09-19 MED ORDER — DOXYCYCLINE HYCLATE 100 MG PO TABS
100.0000 mg | ORAL_TABLET | Freq: Two times a day (BID) | ORAL | Status: DC
  Administered 2017-09-19 – 2017-09-20 (×2): 100 mg via ORAL
  Filled 2017-09-19 (×2): qty 1

## 2017-09-19 MED ORDER — ALBUTEROL SULFATE (2.5 MG/3ML) 0.083% IN NEBU: 2.5000 mg | INHALATION_SOLUTION | RESPIRATORY_TRACT | Status: DC | PRN

## 2017-09-19 MED ORDER — IPRATROPIUM BROMIDE 0.02 % IN SOLN
1.0000 mg | Freq: Once | RESPIRATORY_TRACT | Status: AC
  Administered 2017-09-19: 1 mg via RESPIRATORY_TRACT
  Filled 2017-09-19: qty 5

## 2017-09-19 MED ORDER — ACETAMINOPHEN 650 MG RE SUPP: 650.0000 mg | Freq: Four times a day (QID) | RECTAL | Status: DC | PRN

## 2017-09-19 MED ORDER — MONTELUKAST SODIUM 10 MG PO TABS
10.0000 mg | ORAL_TABLET | Freq: Every day | ORAL | Status: DC
  Administered 2017-09-20: 10 mg via ORAL
  Filled 2017-09-19: qty 1

## 2017-09-19 MED ORDER — VITAMIN D3 25 MCG (1000 UNIT) PO TABS
1000.0000 [IU] | ORAL_TABLET | Freq: Every day | ORAL | Status: DC
  Administered 2017-09-19 – 2017-09-20 (×2): 1000 [IU] via ORAL
  Filled 2017-09-19 (×2): qty 1

## 2017-09-19 MED ORDER — IPRATROPIUM-ALBUTEROL 0.5-2.5 (3) MG/3ML IN SOLN
3.0000 mL | RESPIRATORY_TRACT | Status: DC
  Administered 2017-09-19 – 2017-09-20 (×5): 3 mL via RESPIRATORY_TRACT
  Filled 2017-09-19 (×5): qty 3

## 2017-09-19 MED ORDER — ALBUTEROL SULFATE (2.5 MG/3ML) 0.083% IN NEBU: 5.0000 mg | INHALATION_SOLUTION | Freq: Once | RESPIRATORY_TRACT | Status: DC

## 2017-09-19 MED ORDER — ACETAMINOPHEN 325 MG PO TABS: 650.0000 mg | ORAL_TABLET | Freq: Four times a day (QID) | ORAL | Status: DC | PRN

## 2017-09-19 MED ORDER — MAGNESIUM SULFATE 50 % IJ SOLN
2.0000 g | Freq: Once | INTRAMUSCULAR | Status: DC
  Filled 2017-09-19: qty 4

## 2017-09-19 MED ORDER — PREDNISONE 20 MG PO TABS
60.0000 mg | ORAL_TABLET | Freq: Once | ORAL | Status: AC
  Administered 2017-09-19: 60 mg via ORAL
  Filled 2017-09-19: qty 3

## 2017-09-19 NOTE — ED Triage Notes (Signed)
Patient c/o asthma/expiratory wheezing and a productive cough x 3 days. Patient has used her Albuterol neb treatments at home with no relief.

## 2017-09-19 NOTE — ED Notes (Signed)
Gave Pt Malaw[place]urkey sandwich, apple sauce, and apple juice.

## 2017-09-19 NOTE — ED Notes (Signed)
Attempted to call report with no answer

## 2017-09-19 NOTE — H&P (Signed)
History and Physical    Sonia Drake MWU:[id number] DOB: April 08, 1956 DOA: 09/19/2017  PCP: Verlon Au, MD Patient coming from: Home  Chief Complaint: Dyspnea  HPI: Sonia Drake is a 62 y.o. female with medical history significant of seizures, depression, CHF, COPD, asthma and allergies. Patient reports dyspnea starting two days ago. She symptoms started on her way back from taking her grand kids to [place]. She had dizziness and dyspnea that did not respond to albuterol HFA. She reports getting into the shower to see if the steam would help, but it did not. At that point, she had an episode of urinary incontinence. She was able to get a nebulizer treatment that improved her symptoms. Yesterday, she was going to go to the hospital at that time, but her PCP recommended outpatient follow-up today. Today, however, she was unable to get to her PCPs office secondary to worsening dyspnea. She reports another episode of urinary incontinence. She used her nebulizer 6-7 times in the last two days.  ED Course: Vitals: Afebrile, mild tachycardia after albuterol, normal respirations, normotensive, on nasal canula. Labs: glucose of 161 Imaging: Chest x-ray significant for no active disease Medications/Course: Albuterol x2 and continuous albuterol, mag, prednisone  Review of Systems: Review of Systems  Constitutional: Negative for chills and fever.  Respiratory: Positive for cough, sputum production, shortness of breath and wheezing. Negative for hemoptysis.   Gastrointestinal: Negative for nausea and vomiting.  Neurological: Negative for sensory change, focal weakness and weakness.    Past Medical History:  Diagnosis Date  . Arthritis    "right knee; left hip" (04/07/2017)  . Asthma   . CHF (congestive heart failure) (HCC)    reported treatment for fluid overload in the setting of asthma exacerbation ~ 1998  . COPD (chronic obstructive pulmonary disease) (HCC)   . 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 Dyspnea   . Dysrhythmia    occasional skipped beats  . GERD (gastroesophageal reflux disease)   . On home oxygen therapy    "I sleep with 2L" (04/07/2017)  . Pneumonia    "several times" (04/07/2017)  . Seizures (HCC) 2003 X 1   unknown cause    Past Surgical History:  Procedure Laterality Date  . JOINT REPLACEMENT    . KNEE ARTHROSCOPY Right 2017  . 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 She has never used smokeless tobacco. She reports that she does not drink alcohol or use drugs.  Allergies  Allergen Reactions  . Contrast Media [Iodinated Diagnostic Agents] Anaphylaxis and Other (See Comments)    ** CARDIAC ARREST **  . Peanuts [Peanut Oil] Shortness Of Breath and Swelling  . Shellfish Allergy Shortness Of Breath and Swelling  . Soap Shortness Of Breath and Swelling    LAUNDRY DETERGENT  . Latex Hives  . Eggs Or Egg-Derived Products Rash    Family History  Problem Relation Age of Onset  . Heart disease Mother   . Heart disease Father   . Diabetes Father   . Asthma Father   . Diabetes Brother   . Diabetes Sister   . Asthma Brother   . Asthma Sister   . Hypertension Sister    Prior to Admission medications   Medication Sig Start Date  End Date Taking? Authorizing Provider  albuterol (PROVENTIL HFA;VENTOLIN HFA) 108 (90 Base) MCG/ACT inhaler Inhale 1-2 puffs into the lungs every 6 (six) hours as needed for wheezing or shortness of breath. Patient taking differently: Inhale 2 puffs into the lungs every 6 (six) hours as needed for wheezing or shortness of breath.  02/18/17  Yes Dorothea Ogle, MD  albuterol (PROVENTIL) (2.5 MG/3ML) 0.083% nebulizer solution Take 3 mLs by nebulization every 4 (four)  hours as needed for wheezing or shortness of breath.  07/17/16  Yes [provider]  aspirin EC 325 MG tablet Take 1 tablet (325 mg total) by mouth 2 (two) times daily. 04/07/17  Yes Dannielle Burn K, PA-C  budesonide-formoterol [facility]) 160-4.5 MCG/ACT inhaler Inhale 2 puffs into the lungs 2 (two) times daily.   Yes [provider]  CALCIUM PO Take 1 tablet by mouth daily.   Yes [provider]  Cholecalciferol (VITAMIN D3 PO) Take 1 tablet by mouth daily.   Yes [provider]  ibuprofen (ADVIL,MOTRIN) 800 MG tablet Take 800 mg by mouth every 8 (eight) hours as needed for fever, headache, mild pain or moderate pain.  12/09/16  Yes [provider]  montelukast (SINGULAIR) 10 MG tablet Take 10 mg by mouth daily.    Yes [provider]  pantoprazole (PROTONIX) 20 MG tablet Take 20 mg by mouth daily as needed for indigestion.   Yes [provider]  tiotropium (SPIRIVA) 18 MCG inhalation capsule Place 18 mcg into inhaler and inhale daily.    Yes [provider]  triamcinolone lotion (KENALOG) 0.1 % Apply 1 application topically 2 (two) times daily as needed (for rash).  08/29/15  Yes [provider]  methocarbamol (ROBAXIN) 500 MG tablet Take 1 tablet (500 mg total) by mouth 2 (two) times daily with a meal. Patient not taking: Reported on 09/19/2017 04/07/17   Allena Katz, PA-C  oxyCODONE-acetaminophen (ROXICET) 5-325 MG tablet Take 1 tablet by mouth every 4 (four) hours as needed. Patient not taking: Reported on 09/19/2017 04/07/17   Allena Katz, PA-C    Physical Exam: Vitals:   09/19/17 1136 09/19/17 1204 09/19/17 1327 09/19/17 1508  BP:  (!) 126/54 133/77 (!) 152/129  Pulse:  99 (!) 109 (!) 109  Resp:  (!) 33 (!) 25 17  Temp:  97.7 F (36.5 C)    TempSrc:  Oral    SpO2: 98% 100% 94% 93%  Weight:      Height:         Constitutional: NAD, calm, comfortable Eyes: PERRL, lids and conjunctivae  normal ENMT: Mucous membranes are moist. Posterior pharynx clear of any exudate or lesions.  Neck: normal, supple, no masses, no thyromegaly Respiratory: Audible wheezing without a stethoscope, bilateral wheezing on auscultation with decreased air movement and prolonged expiratory phase. Able to mostly complete her sentences without taking breaths. No accessory muscle usage. Cardiovascular: Regular rate and rhythm, no murmurs / rubs / gallops. No extremity edema. 2+ pedal pulses. No carotid bruits.  Abdomen: no tenderness, no masses palpated. No hepatosplenomegaly. Bowel sounds positive.  Musculoskeletal: no clubbing / cyanosis. No joint deformity upper and lower extremities. Good ROM, no contractures. Normal muscle tone.  Skin: no rashes, lesions, ulcers. No induration. Bilateral lower extremities are cool to touch Neurologic: CN 2-12 grossly intact. Sensation intact, DTR normal. Strength 5/5 in all 4.  Psychiatric: Normal judgment and insight. Alert and oriented x 3. Normal mood.    Labs on Admission: I have  personally reviewed following labs and imaging studies  CBC: Recent Labs  Lab 09/19/17 1131  WBC 4.3  NEUTROABS 2.3  HGB 13.6  HCT 44.0  MCV 90.3  PLT 210   Basic Metabolic Panel: Recent Labs  Lab 09/19/17 1131  NA 137  K 3.7  CL 101  CO2 29  GLUCOSE 161*  BUN 9  CREATININE 0.75  CALCIUM 10.2   GFR: Estimated Creatinine Clearance: 77.7 mL/min (by C-G formula based on SCr of 0.75 mg/dL).  Urine analysis:    Component Value Date/Time   COLORURINE YELLOW 03/31/2017 1500   APPEARANCEUR CLEAR 03/31/2017 1500   LABSPEC 1.016 03/31/2017 1500   PHURINE 7.0 03/31/2017 1500   GLUCOSEU NEGATIVE 03/31/2017 1500   HGBUR NEGATIVE 03/31/2017 1500   BILIRUBINUR NEGATIVE 03/31/2017 1500   KETONESUR NEGATIVE 03/31/2017 1500   PROTEINUR NEGATIVE 03/31/2017 1500   UROBILINOGEN 0.2 07/03/2014 1456   NITRITE NEGATIVE 03/31/2017 1500   LEUKOCYTESUR NEGATIVE 03/31/2017 1500     Radiological Exams on Admission: Dg Chest 2 View  Result Date: 09/19/2017 CLINICAL DATA:  Cough and short of breath 3 days EXAM: CHEST - 2 VIEW COMPARISON:  03/31/2017 FINDINGS: The heart size and mediastinal contours are within normal limits. Both lungs are clear. The visualized skeletal structures are unremarkable. IMPRESSION: No active cardiopulmonary disease. Electronically Signed   By: Marlan Palau M.D.   On: 09/19/2017 11:12    EKG: Independently reviewed. Sinus tachycardia  Assessment/Plan Active Problems:   Asthma exacerbation   Asthma vs CODP exacerbation Unknown trigger. Possibly pollen related vs household cleaning agent, however, less likely. Improved with ED treatment but not at baseline. -Duoneb q4 hours and albuterol q2 hours prn -Solumedrol 60 mg BID -PT eval -Doxycycline  Allergies -Continue Singulair  Chronic respiratory failure with hypoxia  Cool extremities Patient with a family history of peripheral artery disease. Patient has leg cramping intermittently, but history does not sound consistent with claudication (present at rest, does not worsen with ambulation). Pulses 2+. -outpatient follow-up  Chronic diastolic heart failure Last Transthoracic Echocardiogram from 2016 significant for an EF of 60-65% with grade 2 diastolic dysfunction. euvolemic.   DVT prophylaxis: Lovenox Code Status: Full code Family Communication: None at bedside Disposition Plan: Discharge home tomorrow if symptoms improved Consults called: None Admission status: Observation, medical floor   Jacquelin Hawking, MD [facility] Pager [phone]  If 7PM-7AM, please contact night-coverage [email or web address] Password [facility]  09/19/2017, 3:57 PM

## 2017-09-19 NOTE — ED Provider Notes (Signed)
Medical screening examination/treatment/procedure(s) were conducted as a shared visit with non-physician practitioner(s) and myself.  I personally evaluated the patient during the encounter.  Patient here with cough and asthma worse in the last few days similar to previous episodes where she needed to be hospitalized.   On examination she has auditory wheezing but on auscultation she has almost no air movement when taking deep breaths.  She is slightly tachycardic but this is also after at least 15 mg of albuterol 1 of ipratropium and steroids.   She is currently getting magnesium infusion.  Chest x-ray is clear.   Her EKG shows tachycardia but no abnormalities otherwise. Suspect patient likely need to be hospitalized however she was to try one more albuterol prior to that.  Her oxygen saturation is 92% she is unable to speak in full sentences have very low confidence that she will be discharged.  EKG Interpretation  Date/Time:  Friday Sep 19 2017 11:10:07 EDT Ventricular Rate:  100 PR Interval:    QRS Duration: 85 QT Interval:  310 QTC Calculation: 400 R Axis:   72 Text Interpretation:  Sinus tachycardia RSR' in V1 or V2, probably normal variant Borderline repolarization abnormality similar to december 2018 Confirmed by Marily Memos [id number]) on 09/19/2017 1:34:08 PM     Michaelann Gunnoe, Barbara Cower, MD 09/19/17 [phone]

## 2017-09-19 NOTE — ED Notes (Signed)
Patient transported to X-ray 

## 2017-09-19 NOTE — ED Provider Notes (Addendum)
[facility]-EMERGENCY DEPT Provider Note   CSN: [id number] Arrival date & time: 09/19/17  4098     History   Chief Complaint Chief Complaint  Patient presents with  . Cough  . Asthma    HPI Sonia Drake is a 62 y.o. female with history of asthma, CHF, pneumonia who presents with a 3-day history of cough and shortness of breath.  Her cough is productive with green/brownish sputum. She has had associated wheezing.  She reports she was awake all night and used for nebulizer treatments without much relief.  She reports her symptoms feel like her asthma exacerbation.  Patient sleeps with 2 L of oxygen at home.  She denies any fevers, chest pain, abdominal pain, nausea, vomiting, urinary symptoms.  HPI  Past Medical History:  Diagnosis Date  . Arthritis    "right knee; left hip" (04/07/2017)  . Asthma   . CHF (congestive heart failure) (HCC)    reported treatment for fluid overload in the setting of asthma exacerbation ~ 1998  . COPD (chronic obstructive pulmonary disease) (HCC)   . 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 Dyspnea   . Dysrhythmia    occasional skipped beats  . GERD (gastroesophageal reflux disease)   . On home oxygen therapy    "I sleep with 2L" (04/07/2017)  . Pneumonia    "several times" (04/07/2017)  . Seizures (HCC) 2003 X 1   unknown cause    Patient Active Problem List   Diagnosis Date Noted  . Primary osteoarthritis of left hip 04/07/2017  . Osteoarthritis of left hip 04/02/2017  . Lung nodule 02/04/2016  . COPD exacerbation (HCC) 07/21/2015  . Influenza B 04/23/2015  . Asthma exacerbation 04/22/2015  . Hypokalemia 04/22/2015  . 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 Influenza with pneumonia 07/03/2014  . Multiple lung nodules on CT 07/03/2014  . Asthma 07/02/2014  . Hyperglycemia 07/02/2014  . Chronic obstructive pulmonary disease with acute exacerbation (HCC)   . Prolonged QT interval 06/29/2014     Past Surgical History:  Procedure Laterality Date  . JOINT REPLACEMENT    . KNEE ARTHROSCOPY Right 2017  . 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 TOTAL HIP ARTHROPLASTY Left 04/07/2017   Procedure: LEFT TOTAL HIP ARTHROPLASTY ANTERIOR APPROACH;  Surgeon: Gean Birchwood, MD;  Location: [facility] OR;  Service: Orthopedics;  Laterality: Left;     OB History   None      Home Medications    Prior to Admission medications   Medication Sig Start Date End Date Taking? Authorizing Provider  albuterol (PROVENTIL HFA;VENTOLIN HFA) 108 (90 Base) MCG/ACT inhaler Inhale 1-2 puffs into the lungs every 6 (six) hours as needed for wheezing or shortness of breath. Patient taking differently: Inhale 2 puffs into the lungs every 6 (six) hours as needed for wheezing or shortness of breath.  02/18/17  Yes Dorothea Ogle, MD  albuterol (PROVENTIL) (2.5 MG/3ML) 0.083% nebulizer solution Take 3 mLs by nebulization every 4 (four) hours as needed for wheezing or shortness of breath.  07/17/16  Yes [provider]  aspirin EC 325 MG tablet Take 1 tablet (325 mg total) by mouth 2 (two) times daily. 04/07/17  Yes Dannielle Burn K, PA-C  budesonide-formoterol [facility]) 160-4.5 MCG/ACT inhaler Inhale 2 puffs into the lungs 2 (two) times daily.   Yes [provider]  CALCIUM PO Take 1 tablet by mouth daily.   Yes [provider]  Cholecalciferol (VITAMIN D3 PO) Take 1  tablet by mouth daily.   Yes [provider]  ibuprofen (ADVIL,MOTRIN) 800 MG tablet Take 800 mg by mouth every 8 (eight) hours as needed for fever, headache, mild pain or moderate pain.  12/09/16  Yes [provider]  montelukast (SINGULAIR) 10 MG tablet Take 10 mg by mouth daily.    Yes [provider]  pantoprazole (PROTONIX) 20 MG tablet Take 20 mg by mouth daily as needed for indigestion.   Yes [provider]  tiotropium (SPIRIVA) 18 MCG inhalation capsule Place 18 mcg into inhaler and  inhale daily.    Yes [provider]  triamcinolone lotion (KENALOG) 0.1 % Apply 1 application topically 2 (two) times daily as needed (for rash).  08/29/15  Yes [provider]  methocarbamol (ROBAXIN) 500 MG tablet Take 1 tablet (500 mg total) by mouth 2 (two) times daily with a meal. Patient not taking: Reported on 09/19/2017 04/07/17   Allena Katz, PA-C  oxyCODONE-acetaminophen (ROXICET) 5-325 MG tablet Take 1 tablet by mouth every 4 (four) hours as needed. Patient not taking: Reported on 09/19/2017 04/07/17   Allena Katz, PA-C    Family History Family History  Problem Relation Age of Onset  . Heart disease Mother   . Heart disease Father   . Diabetes Father   . Asthma Father   . Diabetes Brother   . Diabetes Sister   . Asthma Brother   . Asthma Sister   . Hypertension Sister     Social History Social History   Tobacco Use  . Smoking status: Never Smoker  . Smokeless tobacco: Never Used  Substance Use Topics  . Alcohol use: No  . Drug use: No     Allergies   Contrast media [iodinated diagnostic agents]; Peanuts [peanut oil]; Shellfish allergy; Soap; Latex; and Eggs or egg-derived products   Review of Systems Review of Systems  Constitutional: Negative for chills and fever.  HENT: Negative for facial swelling and sore throat.   Respiratory: Positive for cough, chest tightness, shortness of breath and wheezing.   Cardiovascular: Negative for chest pain.  Gastrointestinal: Negative for abdominal pain, nausea and vomiting.  Genitourinary: Negative for dysuria.  Musculoskeletal: Negative for back pain.  Skin: Negative for rash and wound.  Neurological: Negative for headaches.  Psychiatric/Behavioral: The patient is not nervous/anxious.      Physical Exam Updated Vital Signs BP (!) 152/129   Pulse (!) 109   Temp 97.7 F (36.5 C) (Oral)   Resp 17   Ht  (1.6 m)   Wt 92.5 kg (204 lb)   SpO2 93%   BMI 36.14 kg/m   Physical Exam   Constitutional: She appears well-developed and well-nourished. No distress.  HENT:  Head: Normocephalic and atraumatic.  Mouth/Throat: Oropharynx is clear and moist. No oropharyngeal exudate.  Eyes: Pupils are equal, round, and reactive to light. Conjunctivae are normal. Right eye exhibits no discharge. Left eye exhibits no discharge. No scleral icterus.  Neck: Normal range of motion. Neck supple. No thyromegaly present.  Cardiovascular: Normal rate, regular rhythm, normal heart sounds and intact distal pulses. Exam reveals no gallop and no friction rub.  No murmur heard. Pulmonary/Chest: Effort normal. No stridor. No respiratory distress. She has wheezes (bilateral expiratory throughout). She has no rales.  Abdominal: Soft. Bowel sounds are normal. She exhibits no distension. There is no tenderness. There is no rebound and no guarding.  Musculoskeletal: She exhibits no edema.  Lymphadenopathy:    She has no cervical  adenopathy.  Neurological: She is alert. Coordination normal.  Skin: Skin is warm and dry. No rash noted. She is not diaphoretic. No pallor.  Psychiatric: She has a normal mood and affect.  Nursing note and vitals reviewed.    ED Treatments / Results  Labs (all labs ordered are listed, but only abnormal results are displayed) Labs Reviewed  BASIC METABOLIC PANEL - Abnormal; Notable for the following components:      Result Value   Glucose, Bld 161 (*)    All other components within normal limits  CBC WITH DIFFERENTIAL/PLATELET - Abnormal; Notable for the following components:   RDW 15.7 (*)    All other components within normal limits    EKG EKG Interpretation  Date/Time:  Friday Sep 19 2017 11:10:07 EDT Ventricular Rate:  100 PR Interval:    QRS Duration: 85 QT Interval:  310 QTC Calculation: 400 R Axis:   72 Text Interpretation:  Sinus tachycardia RSR' in V1 or V2, probably normal variant Borderline repolarization abnormality similar to december 2018  Confirmed by Marily Memos [id number]) on 09/19/2017 1:34:08 PM   Radiology Dg Chest 2 View  Result Date: 09/19/2017 CLINICAL DATA:  Cough and short of breath 3 days EXAM: CHEST - 2 VIEW COMPARISON:  03/31/2017 FINDINGS: The heart size and mediastinal contours are within normal limits. Both lungs are clear. The visualized skeletal structures are unremarkable. IMPRESSION: No active cardiopulmonary disease. Electronically Signed   By: Marlan Palau M.D.   On: 09/19/2017 11:12    Procedures Procedures (including critical care time)  CRITICAL CARE Performed by: Emi Holes   Total critical care time: 35 minutes  Critical care time was exclusive of separately billable procedures and treating other patients.  Critical care was necessary to treat or prevent imminent or life-threatening deterioration.  Critical care was time spent personally by me on the following activities: development of treatment plan with patient and/or surrogate as well as nursing, discussions with consultants, evaluation of patient's response to treatment, examination of patient, obtaining history from patient or surrogate, ordering and performing treatments and interventions, ordering and review of laboratory studies, ordering and review of radiographic studies, pulse oximetry and re-evaluation of patient's condition.   Medications Ordered in ED Medications  albuterol (PROVENTIL,VENTOLIN) solution continuous neb (0 mg/hr Nebulization Stopped 09/19/17 1335)  albuterol (PROVENTIL) (2.5 MG/3ML) 0.083% nebulizer solution 5 mg (5 mg Nebulization Given 09/19/17 1014)  predniSONE (DELTASONE) tablet 60 mg (60 mg Oral Given 09/19/17 1103)  ipratropium (ATROVENT) nebulizer solution 1 mg (1 mg Nebulization Given 09/19/17 1136)  magnesium sulfate IVPB 2 g 50 mL (0 g Intravenous Stopped 09/19/17 1442)  ipratropium-albuterol (DUONEB) 0.5-2.5 (3) MG/3ML nebulizer solution 3 mL (3 mLs Nebulization Given 09/19/17 1423)     Initial  Impression / Assessment and Plan / ED Course  I have reviewed the triage vital signs and the nursing notes.  Pertinent labs & imaging results that were available during my care of the patient were reviewed by me and considered in my medical decision making (see chart for details).  Clinical Course as of Sep 20 1543  Fri Sep 19, 2017  1326 On reevaluation after continuous albuterol with Atrovent, patient is subjectively feeling better, however patient still has expiratory wheezing bilaterally which is heard without auscultation.  Will proceed with magnesium.   [AL]    Clinical Course User Index [AL] Emi Holes, PA-C    Patient presenting with asthma exacerbation not improving with several nebulizer treatments ( including continuous with  Atrovent), magnesium, and prednisone in the ED.  CBC, BMP, chest x-ray unremarkable.  Patient oxygen saturations around 92% on room air.  Placed on nasal cannula.  Patient very dyspneic with very little exertion.  I spoke with Dr. Caleb Popp with [facility] to admit the patient for further evaluation.  Patient also evaluated Dr. Clayborne Dana who guided the patient's management and agrees with plan.  Final Clinical Impressions(s) / ED Diagnoses   Final diagnoses:  Moderate persistent asthma with exacerbation    ED Discharge Orders    None       Verdis Prime 09/19/17 1546    Mesner, Barbara Cower, MD 09/19/17 [address], PA-C 10/06/17 1718    Mesner, Barbara Cower, MD 10/09/17 1029

## 2017-09-19 NOTE — ED Notes (Signed)
Respiratory at bedside.

## 2017-09-20 DIAGNOSIS — J45901 Unspecified asthma with (acute) exacerbation: Secondary | ICD-10-CM

## 2017-09-20 DIAGNOSIS — I5032 Chronic diastolic (congestive) heart failure: Secondary | ICD-10-CM | POA: Diagnosis not present

## 2017-09-20 DIAGNOSIS — J4541 Moderate persistent asthma with (acute) exacerbation: Secondary | ICD-10-CM | POA: Diagnosis not present

## 2017-09-20 DIAGNOSIS — J441 Chronic obstructive pulmonary disease with (acute) exacerbation: Secondary | ICD-10-CM | POA: Diagnosis not present

## 2017-09-20 LAB — BASIC METABOLIC PANEL
Anion gap: 8 (ref 5–15)
BUN: 9 mg/dL (ref 6–20)
CO2: 26 mmol/L (ref 22–32)
Calcium: 10.6 mg/dL — ABNORMAL HIGH (ref 8.9–10.3)
Chloride: 102 mmol/L (ref 101–111)
Creatinine, Ser: 0.53 mg/dL (ref 0.44–1.00)
GFR calc Af Amer: >60 mL/min (ref >60)
GFR calc non Af Amer: >60 mL/min (ref >60)
Glucose, Bld: 189 mg/dL — ABNORMAL HIGH (ref 65–99)
Potassium: 4.2 mmol/L (ref 3.5–5.1)
Sodium: 136 mmol/L (ref 135–145)

## 2017-09-20 LAB — CBC
HCT: 41.1 % (ref 36.0–46.0)
Hemoglobin: 12.7 g/dL (ref 12.0–15.0)
MCH: 27.4 pg (ref 26.0–34.0)
MCHC: 30.9 g/dL (ref 30.0–36.0)
MCV: 88.6 fL (ref 78.0–100.0)
Platelets: 212 10*3/uL (ref 150–400)
RBC: 4.64 MIL/uL (ref 3.87–5.11)
RDW: 15.6 % — ABNORMAL HIGH (ref 11.5–15.5)
WBC: 3.5 10*3/uL — ABNORMAL LOW (ref 4.0–10.5)

## 2017-09-20 LAB — PROCALCITONIN: Procalcitonin: <0.1 ng/mL

## 2017-09-20 MED ORDER — PREDNISONE 20 MG PO TABS: 40.0000 mg | ORAL_TABLET | Freq: Every day | ORAL | 0 refills | Status: DC

## 2017-09-20 MED ORDER — PREDNISONE 20 MG PO TABS
40.0000 mg | ORAL_TABLET | Freq: Every day | ORAL | Status: DC
  Administered 2017-09-20: 40 mg via ORAL
  Filled 2017-09-20: qty 2

## 2017-09-20 MED ORDER — DOXYCYCLINE HYCLATE 100 MG PO TABS: 100.0000 mg | ORAL_TABLET | Freq: Two times a day (BID) | ORAL | 0 refills | Status: AC

## 2017-09-20 NOTE — Progress Notes (Signed)
Patient discharged to home, all discharge medications and instructions reviewed and questions answered.  Patient to be assisted to vehicle by wheelchair.  

## 2017-09-20 NOTE — Discharge Summary (Signed)
Discharge Summary  S[place] Drake YQM:[id number] DOB: 1956/02/17  PCP: Verlon Au, MD  Admit date: 09/19/2017 Discharge date: 09/20/2017  Time spent: 25 minutes  Recommendations for Outpatient Follow-up:  1. Follow-up with PCP 2. Take your medications as prescribed   Discharge Diagnoses:  Active Hospital Problems   Diagnosis Date Noted  . Chronic diastolic CHF (congestive heart failure) (HCC) 09/19/2017  . Asthma exacerbation 04/22/2015  . Chronic obstructive pulmonary disease with acute exacerbation [facility])     Resolved Hospital Problems  No resolved problems to display.    Discharge Condition: Stable  Diet recommendation: Resume previous diet  Vitals:   09/20/17 1237 09/20/17 1311  BP:  (!) 119/51  Pulse:  94  Resp:    Temp:  97.8 F (36.6 C)  SpO2: 91% 92%    History of present illness:  Sonia Drake is a 62 y.o. female with medical history significant of seizures, depression, CHF, COPD, asthma and allergies. Patient reports dyspnea starting two days ago. She symptoms started on her way back from taking her grand kids [place]. She had dizziness and dyspnea that did not respond to albuterol HFA. She reports getting into the shower to see if the steam would help, but it did not. At that point, she had an episode of urinary incontinence. She was able to get a nebulizer treatment that improved her symptoms. Yesterday, she was going to go to the hospital at that time, but her PCP recommended outpatient follow-up today. Today, however, she was unable to get to her PCPs office secondary to worsening dyspnea. She reports another episode of urinary incontinence. She used her nebulizer 6-7 times in the last two days.  ED Course: Vitals: Afebrile, mild tachycardia after albuterol, normal respirations, normotensive, on nasal canula. Labs: glucose of 161 Imaging: Chest x-ray significant for no active disease Medications/Course: Albuterol x2 and continuous albuterol,  mag, prednisone  09/20/2017: Patient seen and examined at bedside.  She denies dyspnea, chest pain, palpitations.  On the day of discharge patient was hemodynamically stable.  She will need to follow-up with her PCP post hospitalization.  Hospital Course:  Active Problems:   Chronic obstructive pulmonary disease with acute exacerbation (HCC)   Asthma exacerbation   Chronic diastolic CHF (congestive heart failure) (HCC)  Acute Asthmatic flare, resolved Unknown trigger. Possibly pollen related vs household cleaning agent, however, less likely. Improved with ED treatment. Back to her baseline Continue nebs treatment at home Doxyxycline x5 days Prednisone taper x5 days Follow-up with PCP  Allergies -Continue Singulair  Chronic respiratory failure with hypoxia On 2 L of oxygen by nasal cannula continuously at home  Chronic diastolic heart failure Last Transthoracic Echocardiogram from 2016 significant for an EF of 60-65% with grade 2 diastolic dysfunction. Euvolemic. No acute issues.  Procedures:  None  Consultations:  None  Discharge Exam: BP (!) 119/51 (BP Location: Left Arm)   Pulse 94   Temp 97.8 F (36.6 C) (Oral)   Resp 18   Ht 5\' 3"  (1.6 m)   Wt 92.5 kg (204 lb)   SpO2 92%   BMI 36.14 kg/m  . General: 62 y.o. year-old female well developed well nourished in no acute distress.  Alert and oriented x3. . Cardiovascular: Regular rate and rhythm with no rubs or gallops.  No thyromegaly or JVD noted.   Marland Kitchen Respiratory: Clear to auscultation with no wheezes or rales. Good inspiratory effort. . Abdomen: Soft nontender nondistended with normal bowel sounds x4 quadrants. . Musculoskeletal: No  lower extremity edema. 2/4 pulses in all 4 extremities. . Skin: No ulcerative lesions noted or rashes, . Psychiatry: Mood is appropriate for condition and setting.  Discharge Instructions You were cared for by a hospitalist during your hospital stay. If you have any questions  about your discharge medications or the care you received while you were in the hospital after you are discharged, you can call the unit and asked to speak with the hospitalist on call if the hospitalist that took care of you is not available. Once you are discharged, your primary care physician will handle any further medical issues. Please note that NO REFILLS for any discharge medications will be authorized once you are discharged, as it is imperative that you return to your primary care physician (or establish a relationship with a primary care physician if you do not have one) for your aftercare needs so that they can reassess your need for medications and monitor your lab values.   Allergies as of 09/20/2017      Reactions   Contrast Media [iodinated Diagnostic Agents] Anaphylaxis, Other (See Comments)   ** CARDIAC ARREST **   Peanuts [peanut Oil] Shortness Of Breath, Swelling   Shellfish Allergy Shortness Of Breath, Swelling   Soap Shortness Of Breath, Swelling   LAUNDRY DETERGENT   Latex Hives   Eggs Or Egg-derived Products Rash      Medication List    STOP taking these medications   ibuprofen 800 MG tablet Commonly known as:  ADVIL,MOTRIN   methocarbamol 500 MG tablet Commonly known as:  ROBAXIN   oxyCODONE-acetaminophen 5-325 MG tablet Commonly known as:  ROXICET     TAKE these medications   albuterol 108 (90 Base) MCG/ACT inhaler Commonly known as:  PROVENTIL HFA;VENTOLIN HFA Inhale 1-2 puffs into the lungs every 6 (six) hours as needed for wheezing or shortness of breath. What changed:    how much to take  Another medication with the same name was removed. Continue taking this medication, and follow the directions you see here.   budesonide-formoterol 160-4.5 MCG/ACT inhaler Commonly known as:  SYMBICORT Inhale 2 puffs into the lungs 2 (two) times daily.   CALCIUM PO Take 1 tablet by mouth daily.   doxycycline 100 MG tablet Commonly known as:  VIBRA-TABS Take  1 tablet (100 mg total) by mouth every 12 (twelve) hours for 5 days.   montelukast 10 MG tablet Commonly known as:  SINGULAIR Take 10 mg by mouth daily.   pantoprazole 20 MG tablet Commonly known as:  PROTONIX Take 20 mg by mouth daily as needed for indigestion.   predniSONE 20 MG tablet Commonly known as:  DELTASONE Take 2 tablets (40 mg total) by mouth daily with breakfast. 09/20/17: Take 40 mg x 1 day 09/21/17 Take 30 mg x 1 day 09/22/17 Take 20 mg x 1 day 09/23/17 Take 10 mg x 1 day Start taking on:  09/21/2017   tiotropium 18 MCG inhalation capsule Commonly known as:  SPIRIVA Place 18 mcg into inhaler and inhale daily.   triamcinolone lotion 0.1 % Commonly known as:  KENALOG Apply 1 application topically 2 (two) times daily as needed (for rash).   VITAMIN D3 PO Take 1 tablet by mouth daily.      Allergies  Allergen Reactions  . Contrast Media [Iodinated Diagnostic Agents] Anaphylaxis and Other (See Comments)    ** CARDIAC ARREST **  . Peanuts [Peanut Oil] Shortness Of Breath and Swelling  . Shellfish Allergy Shortness Of Breath and  Swelling  . Soap Shortness Of Breath and Swelling    LAUNDRY DETERGENT  . Latex Hives  . Eggs Or Egg-Derived Products Rash   Follow-up Information    Verlon Au, MD. Call in 1 day(s).   Specialty:  Family Medicine Why:  please call for appointment. Contact information: [address] [place] [address] [phone]            The results of significant diagnostics from this hospitalization (including imaging, microbiology, ancillary and laboratory) are listed below for reference.    Significant Diagnostic Studies: Dg Chest 2 View  Result Date: 09/19/2017 CLINICAL DATA:  Cough and short of breath 3 days EXAM: CHEST - 2 VIEW COMPARISON:  03/31/2017 FINDINGS: The heart size and mediastinal contours are within normal limits. Both lungs are clear. The visualized skeletal structures are unremarkable.  IMPRESSION: No active cardiopulmonary disease. Electronically Signed   By: Marlan Palau M.D.   On: 09/19/2017 11:12    Microbiology: No results found for this or any previous visit (from the past 240 hour(s)).   Labs: Basic Metabolic Panel: Recent Labs  Lab 09/19/17 1131 09/20/17 0421  NA 137 136  K 3.7 4.2  CL 101 102  CO2 29 26  GLUCOSE 161* 189*  BUN 9 9  CREATININE 0.75 0.53  CALCIUM 10.2 10.6*   Liver Function Tests: No results for input(s): AST, ALT, ALKPHOS, BILITOT, PROT, ALBUMIN in the last 168 hours. No results for input(s): LIPASE, AMYLASE in the last 168 hours. No results for input(s): AMMONIA in the last 168 hours. CBC: Recent Labs  Lab 09/19/17 1131 09/20/17 0421  WBC 4.3 3.5*  NEUTROABS 2.3  --   HGB 13.6 12.7  HCT 44.0 41.1  MCV 90.3 88.6  PLT 210 212   Cardiac Enzymes: No results for input(s): CKTOTAL, CKMB, CKMBINDEX, TROPONINI in the last 168 hours. BNP: BNP (last 3 results) No results for input(s): BNP in the last 8760 hours.  ProBNP (last 3 results) No results for input(s): PROBNP in the last 8760 hours.  CBG: No results for input(s): GLUCAP in the last 168 hours.     Signed:  Darlin Drop, MD [facility] 09/20/2017, 1:39 PM

## 2017-09-20 NOTE — Discharge Instructions (Signed)
Asthma Attack Prevention, Adult Although you may not be able to control the fact that you have asthma, you can take actions to prevent episodes of asthma (asthma attacks). These actions include:  Creating a written plan for managing and treating your asthma attacks (asthma action plan).  Monitoring your asthma.  Avoiding things that can irritate your airways or make your asthma symptoms worse (asthma triggers).  Taking your medicines as directed.  Acting quickly if you have signs or symptoms of an asthma attack.  What are some ways to prevent an asthma attack? Create a plan Work with your health care provider to create an asthma action plan. This plan should include:  A list of your asthma triggers and how to avoid them.  A list of symptoms that you experience during an asthma attack.  Information about when to take medicine and how much medicine to take.  Information to help you understand your peak flow measurements.  Contact information for your health care providers.  Daily actions that you can take to control asthma.  Monitor your asthma  To monitor your asthma:  Use your peak flow meter every morning and every evening for 2-3 weeks. Record the results in a journal. A drop in your peak flow numbers on one or more days may mean that you are starting to have an asthma attack, even if you are not having symptoms.  When you have asthma symptoms, write them down in a journal.  Avoid asthma triggers  Work with your health care provider to find out what your asthma triggers are. This can be done by:  Being tested for allergies.  Keeping a journal that notes when asthma attacks occur and what may have contributed to them.  Asking your health care provider whether other medical conditions make your asthma worse.  Common asthma triggers include:  Dust.  Smoke. This includes campfire smoke and secondhand smoke from tobacco products.  Pet dander.  Trees, grasses or  pollens.  Very cold, dry, or humid air.  Mold.  Foods that contain high amounts of sulfites.  Strong smells.  Engine exhaust and air pollution.  Aerosol sprays and fumes from household cleaners.  Household pests and their droppings, including dust mites and cockroaches.  Certain medicines, including NSAIDs.  Once you have determined your asthma triggers, take steps to avoid them. Depending on your triggers, you may be able to reduce the chance of an asthma attack by:  Keeping your home clean. Have someone dust and vacuum your home for you 1 or 2 times a week. If possible, have them use a high-efficiency particulate arrestance (HEPA) vacuum.  Washing your sheets weekly in hot water.  Using allergy-proof mattress covers and casings on your bed.  Keeping pets out of your home.  Taking care of mold and water problems in your home.  Avoiding areas where people smoke.  Avoiding using strong perfumes or odor sprays.  Avoid spending a lot of time outdoors when pollen counts are high and on very windy days.  Talking with your health care provider before stopping or starting any new medicines.  Medicines Take over-the-counter and prescription medicines only as told by your health care provider. Many asthma attacks can be prevented by carefully following your medicine schedule. Taking your medicines correctly is especially important when you cannot avoid certain asthma triggers. Even if you are doing well, do not stop taking your medicine and do not take less medicine. Act quickly If an asthma attack happens, acting quickly   can decrease how severe it is and how long it lasts. Take these actions:  Pay attention to your symptoms. If you are coughing, wheezing, or having difficulty breathing, do not wait to see if your symptoms go away on their own. Follow your asthma action plan.  If you have followed your asthma action plan and your symptoms are not improving, call your health care  provider or seek immediate medical care at the nearest hospital.  It is important to write down how often you need to use your fast-acting rescue inhaler. You can track how often you use an inhaler in your journal. If you are using your rescue inhaler more often, it may mean that your asthma is not under control. Adjusting your asthma treatment plan may help you to prevent future asthma attacks and help you to gain better control of your condition. How can I prevent an asthma attack when I exercise?  Exercise is a common asthma trigger. To prevent asthma attacks during exercise:  Follow advice from your health care provider about whether you should use your fast-acting inhaler before exercising. Many people with asthma experience exercise-induced bronchoconstriction (EIB). This condition often worsens during vigorous exercise in cold, humid, or dry environments. Usually, people with EIB can stay very active by using a fast-acting inhaler before exercising.  Avoid exercising outdoors in very cold or humid weather.  Avoid exercising outdoors when pollen counts are high.  Warm up and cool down when exercising.  Stop exercising right away if asthma symptoms start.  Consider taking part in exercises that are less likely to cause asthma symptoms such as:  Indoor swimming.  Biking.  Walking.  Hiking.  Playing football.  This information is not intended to replace advice given to you by your health care provider. Make sure you discuss any questions you have with your health care provider. Document Released: 03/27/2009 Document Revised: 12/08/2015 Document Reviewed: 09/23/2015 Elsevier Interactive Patient Education  2018 [employer]vier Inc.  

## 2017-10-14 ENCOUNTER — Emergency Department (HOSPITAL_COMMUNITY): Payer: Medicaid Other

## 2017-10-14 ENCOUNTER — Emergency Department (HOSPITAL_COMMUNITY)
Admission: EM | Admit: 2017-10-14 | Discharge: 2017-10-15 | Disposition: A | Discharge status: Home / Self Care | Payer: Medicaid Other | Attending: Emergency Medicine | Admitting: Emergency Medicine

## 2017-10-14 ENCOUNTER — Encounter (HOSPITAL_COMMUNITY): Payer: Self-pay | Admitting: Emergency Medicine

## 2017-10-14 DIAGNOSIS — Z91010 Allergy to peanuts: Secondary | ICD-10-CM | POA: Diagnosis not present

## 2017-10-14 DIAGNOSIS — Z96642 Presence of left artificial hip joint: Secondary | ICD-10-CM | POA: Diagnosis not present

## 2017-10-14 DIAGNOSIS — I5032 Chronic diastolic (congestive) heart failure: Secondary | ICD-10-CM | POA: Insufficient documentation

## 2017-10-14 DIAGNOSIS — Z79899 Other long term (current) drug therapy: Secondary | ICD-10-CM | POA: Diagnosis not present

## 2017-10-14 DIAGNOSIS — R0602 Shortness of breath: Secondary | ICD-10-CM | POA: Diagnosis present

## 2017-10-14 DIAGNOSIS — Z91040 Latex allergy status: Secondary | ICD-10-CM | POA: Diagnosis not present

## 2017-10-14 DIAGNOSIS — J441 Chronic obstructive pulmonary disease with (acute) exacerbation: Secondary | ICD-10-CM | POA: Insufficient documentation

## 2017-10-14 LAB — BASIC METABOLIC PANEL
Anion gap: 8 (ref 5–15)
BUN: 8 mg/dL (ref 8–23)
CO2: 29 mmol/L (ref 22–32)
Calcium: 9.8 mg/dL (ref 8.9–10.3)
Chloride: 106 mmol/L (ref 98–111)
Creatinine, Ser: 0.67 mg/dL (ref 0.44–1.00)
GFR calc Af Amer: >60 mL/min (ref >60)
GFR calc non Af Amer: >60 mL/min (ref >60)
Glucose, Bld: 90 mg/dL (ref 70–99)
Potassium: 4.3 mmol/L (ref 3.5–5.1)
Sodium: 143 mmol/L (ref 135–145)

## 2017-10-14 LAB — CBC
HCT: 41 % (ref 36.0–46.0)
Hemoglobin: 12.7 g/dL (ref 12.0–15.0)
MCH: 28.6 pg (ref 26.0–34.0)
MCHC: 31 g/dL (ref 30.0–36.0)
MCV: 92.3 fL (ref 78.0–100.0)
Platelets: 259 10*3/uL (ref 150–400)
RBC: 4.44 MIL/uL (ref 3.87–5.11)
RDW: 15.5 % (ref 11.5–15.5)
WBC: 7.5 10*3/uL (ref 4.0–10.5)

## 2017-10-14 LAB — BRAIN NATRIURETIC PEPTIDE: B Natriuretic Peptide: 27.5 pg/mL (ref 0.0–100.0)

## 2017-10-14 MED ORDER — IPRATROPIUM BROMIDE 0.02 % IN SOLN
RESPIRATORY_TRACT | Status: AC
  Administered 2017-10-14: 0.5 mg via RESPIRATORY_TRACT
  Filled 2017-10-14: qty 2.5

## 2017-10-14 MED ORDER — MAGNESIUM SULFATE 50 % IJ SOLN: 1.0000 g | Freq: Once | INTRAMUSCULAR | Status: DC

## 2017-10-14 MED ORDER — METHYLPREDNISOLONE SODIUM SUCC 125 MG IJ SOLR
125.0000 mg | Freq: Once | INTRAMUSCULAR | Status: AC
  Administered 2017-10-14: 125 mg via INTRAVENOUS
  Filled 2017-10-14: qty 2

## 2017-10-14 MED ORDER — ALBUTEROL (5 MG/ML) CONTINUOUS INHALATION SOLN
10.0000 mg/h | INHALATION_SOLUTION | RESPIRATORY_TRACT | Status: DC
  Administered 2017-10-14: 10 mg/h via RESPIRATORY_TRACT

## 2017-10-14 MED ORDER — ALBUTEROL (5 MG/ML) CONTINUOUS INHALATION SOLN
INHALATION_SOLUTION | RESPIRATORY_TRACT | Status: AC
Start: 2017-10-14 — End: 2017-10-14
  Administered 2017-10-14: 10 mg/h via RESPIRATORY_TRACT
  Filled 2017-10-14: qty 20

## 2017-10-14 MED ORDER — MAGNESIUM SULFATE IN D5W 1-5 GM/100ML-% IV SOLN
1.0000 g | Freq: Once | INTRAVENOUS | Status: AC
  Administered 2017-10-14: 1 g via INTRAVENOUS
  Filled 2017-10-14: qty 100

## 2017-10-14 MED ORDER — IPRATROPIUM BROMIDE 0.02 % IN SOLN
0.5000 mg | Freq: Once | RESPIRATORY_TRACT | Status: AC
  Administered 2017-10-14: 0.5 mg via RESPIRATORY_TRACT

## 2017-10-14 NOTE — ED Provider Notes (Signed)
[facility]-EMERGENCY DEPT Provider Note   CSN: [id number] Arrival date & time: 10/14/17  2134     History   Chief Complaint Chief Complaint  Patient presents with  . Respiratory Distress    HPI Niel HummerMary J Catarino is a 62 y.o. female.  HPI  Ms. [name] is a 62yo female with a history of asthma, diastolic CHF (60-65%), seizures who presents to the Emergency Department for wheezing and SOB. Level 5 Caveat applies given patient's respiratory distress and unable to speak.   Past Medical History:  Diagnosis Date  . Arthritis    "right knee; left hip" (04/07/2017)  . Asthma   . CHF (congestive heart failure) (HCC)    reported treatment for fluid overload in the setting of asthma exacerbation ~ 1998  . COPD (chronic obstructive pulmonary disease) (HCC)   . 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 Dyspnea   . Dysrhythmia    occasional skipped beats  . GERD (gastroesophageal reflux disease)   . On home oxygen therapy    "I sleep with 2L" (04/07/2017)  . Pneumonia    "several times" (04/07/2017)  . Seizures (HCC) 2003 X 1   unknown cause    Patient Active Problem List   Diagnosis Date Noted  . Chronic diastolic CHF (congestive heart failure) (HCC) 09/19/2017  . Primary osteoarthritis of left hip 04/07/2017  . Osteoarthritis of left hip 04/02/2017  . Lung nodule 02/04/2016  . COPD exacerbation (HCC) 07/21/2015  . Influenza B 04/23/2015  . Asthma exacerbation 04/22/2015  . Hypokalemia 04/22/2015  . 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 Influenza with pneumonia 07/03/2014  . Multiple lung nodules on CT 07/03/2014  . Asthma 07/02/2014  . Hyperglycemia 07/02/2014  . Chronic obstructive pulmonary disease with acute exacerbation (HCC)   . Prolonged QT interval 06/29/2014    Past Surgical History:  Procedure Laterality Date  . JOINT REPLACEMENT    . KNEE ARTHROSCOPY Right 2017  . 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 TOTAL HIP  ARTHROPLASTY Left 04/07/2017   Procedure: LEFT TOTAL HIP ARTHROPLASTY ANTERIOR APPROACH;  Surgeon: Gean Birchwoodowan, Frank, MD;  Location: [facility] OR;  Service: Orthopedics;  Laterality: Left;     OB History   None      Home Medications    Prior to Admission medications   Medication Sig Start Date End Date Taking? Authorizing Provider  albuterol (PROVENTIL HFA;VENTOLIN HFA) 108 (90 Base) MCG/ACT inhaler Inhale 1-2 puffs into the lungs every 6 (six) hours as needed for wheezing or shortness of breath. Patient taking differently: Inhale 2 puffs into the lungs every 6 (six) hours as needed for wheezing or shortness of breath.  02/18/17   Dorothea OgleMyers, Iskra M, MD  budesonide-formoterol [facility](SYMBICORT) 160-4.5 MCG/ACT inhaler Inhale 2 puffs into the lungs 2 (two) times daily.    [provider]  CALCIUM PO Take 1 tablet by mouth daily.    [provider]  Cholecalciferol (VITAMIN D3 PO) Take 1 tablet by mouth daily.    [provider]  montelukast (SINGULAIR) 10 MG tablet Take 10 mg by mouth daily.     [provider]  pantoprazole (PROTONIX) 20 MG tablet Take 20 mg by mouth daily as needed for indigestion.    [provider]  predniSONE (DELTASONE) 20 MG tablet Take 2 tablets (40 mg total) by mouth daily with breakfast. 09/20/17: Take 40 mg x 1 day 09/21/17 Take 30 mg x 1 day 09/22/17 Take 20 mg x 1 day 09/23/17  Take 10 mg x 1 day 09/21/17   Darlin Drop, DO  tiotropium (SPIRIVA) 18 MCG inhalation capsule Place 18 mcg into inhaler and inhale daily.     [provider]  triamcinolone lotion (KENALOG) 0.1 % Apply 1 application topically 2 (two) times daily as needed (for rash).  08/29/15   [provider]    Family History Family History  Problem Relation Age of Onset  . Heart disease Mother   . Heart disease Father   . Diabetes Father   . Asthma Father   . Diabetes Brother   . Diabetes Sister   . Asthma Brother   . Asthma Sister   . Hypertension Sister       Social History Social History   Tobacco Use  . Smoking status: Never Smoker  . Smokeless tobacco: Never Used  Substance Use Topics  . Alcohol use: No  . Drug use: No     Allergies   Contrast media [iodinated diagnostic agents]; Peanuts [peanut oil]; Shellfish allergy; Soap; Latex; and Eggs or egg-derived products   Review of Systems Review of Systems  Unable to perform ROS: Severe respiratory distress     Physical Exam Updated Vital Signs BP (!) 141/93 (BP Location: Right Arm)   Pulse 92   Temp 97.9 F (36.6 C) (Oral)   Resp (!) 23   SpO2 95%   Physical Exam  Constitutional: She appears well-developed and well-nourished.  Respiratory distress with audible wheezing. Unable to speak in full sentences. Appears anxious.   HENT:  Head: Normocephalic and atraumatic.  Mouth/Throat: Oropharynx is clear and moist.  Eyes: Right eye exhibits no discharge. Left eye exhibits no discharge.  Cardiovascular: Intact distal pulses.  Tachycardic, regular rhythm.   Pulmonary/Chest:  Shallow breathing. Good air movement. Inspiratory and expiratory wheezing in bilateral upper and lower lung fields. No stridor or crackles.   Abdominal: Soft. Bowel sounds are normal. There is no tenderness.  Musculoskeletal:  No leg swelling or calf tenderness.   Neurological: She is alert. Coordination normal.  Skin: Skin is warm and dry. Capillary refill takes less than 2 seconds.  Psychiatric: She has a normal mood and affect. Her behavior is normal.  Nursing note and vitals reviewed.    ED Treatments / Results  Labs (all labs ordered are listed, but only abnormal results are displayed) Labs Reviewed  CBC  BASIC METABOLIC PANEL  BRAIN NATRIURETIC PEPTIDE    EKG EKG Interpretation  Date/Time:  Tuesday October 14 2017 21:51:27 EDT Ventricular Rate:  89 PR Interval:    QRS Duration: 89 QT Interval:  349 QTC Calculation: 425 R Axis:   70 Text Interpretation:  Normal sinus rhythm RSR'  in V1 or V2, probably normal variant Borderline repolarization abnormality no significant change since May 2019 Confirmed by Pricilla Loveless [phone]) on 10/14/2017 10:16:05 PM   Radiology Dg Chest Portable 1 View  Result Date: 10/14/2017 CLINICAL DATA:  Patient presents with wheezing and cough. Asthma attack. EXAM: PORTABLE CHEST 1 VIEW COMPARISON:  09/19/2017 FINDINGS: The heart size and mediastinal contours are within normal limits. Both lungs are clear. The visualized skeletal structures are unremarkable. IMPRESSION: No active disease. Electronically Signed   By: Tollie Eth M.D.   On: 10/14/2017 22:11    Procedures Procedures (including critical care time)  CRITICAL CARE Performed by: Kellie Shropshire   Total critical care time: 35 minutes  Critical care time was exclusive of separately billable procedures and treating other patients.  Critical care was  necessary to treat or prevent imminent or life-threatening deterioration.  Critical care was time spent personally by me on the following activities: development of treatment plan with patient and/or surrogate as well as nursing, discussions with consultants, evaluation of patient's response to treatment, examination of patient, obtaining history from patient or surrogate, ordering and performing treatments and interventions, ordering and review of laboratory studies, ordering and review of radiographic studies, pulse oximetry and re-evaluation of patient's condition.   Medications Ordered in ED Medications  albuterol (PROVENTIL,VENTOLIN) solution continuous neb (0 mg/hr Nebulization Stopped 10/14/17 2258)  ipratropium (ATROVENT) nebulizer solution 0.5 mg (0.5 mg Nebulization Given 10/14/17 2146)  methylPREDNISolone sodium succinate (SOLU-MEDROL) 125 mg/2 mL injection 125 mg (125 mg Intravenous Given 10/14/17 2150)  magnesium sulfate IVPB 1 g 100 mL (1 g Intravenous New Bag/Given 10/14/17 2205)     Initial Impression / Assessment and  Plan / ED Course  I have reviewed the triage vital signs and the nursing notes.  Pertinent labs & imaging results that were available during my care of the patient were reviewed by me and considered in my medical decision making (see chart for details).     Patient with a history of asthma presents with acute asthma exacerbation.  On arrival she is wheezing and unable to speak in full sentences given respiratory distress.  No leg swelling or signs of fluid overload.  Patient started on continuous albuterol nebulizer, IV Solu-Medrol and IV magnesium.  Portable chest x-ray without acute abnormality, no pneumonia.  CBC, BMP and BNP within normal limits.  On recheck, patient reports improvement after 1 hour of continuous albuterol nebulizer.  Repeat lung exam with bilateral upper and lower expiratory wheezing, much improved from previous. Plan to observe patient in the emergency department and recheck. If she continues to improve and feels comfortable going home, I think this is appropriate.  If not, she may require admission.  This was a shared visit with Dr. Criss Alvine who also saw the patient and agrees with this plan.  Signout given to PA [employer] at shift change for disposition.  Final Clinical Impressions(s) / ED Diagnoses   Final diagnoses:  None    ED Discharge Orders    None       Lawrence Marseilles 10/14/17 2341    Pricilla Loveless, MD 10/16/17 [phone]

## 2017-10-14 NOTE — ED Notes (Signed)
Continuous neb started.  

## 2017-10-14 NOTE — ED Notes (Signed)
PA at bedside.

## 2017-10-14 NOTE — ED Triage Notes (Addendum)
Patient presents with wheezing and coughing. HX intubation with asthma attacks. Respiratory called to bedside.

## 2017-10-14 NOTE — ED Notes (Signed)
Respiratory at bedside.

## 2017-10-15 MED ORDER — AZITHROMYCIN 250 MG PO TABS: 250.0000 mg | ORAL_TABLET | Freq: Every day | ORAL | 0 refills | Status: DC

## 2017-10-15 MED ORDER — PREDNISONE 20 MG PO TABS: 40.0000 mg | ORAL_TABLET | Freq: Every day | ORAL | 0 refills | Status: DC

## 2017-10-15 NOTE — ED Provider Notes (Signed)
Patient signed out to me at shift change.  Patient initially seen for respiratory distress.  She had been outside while her neighbor was cooking with a smoker and began to have a COPD exacerbation.  She was unable to speak on arrival in the emergency department today.  She was given several nebulizer treatments, magnesium, Solu-Medrol, and a continuous albuterol treatment.  She is now able to speak, but does still have some increased work of breathing.  She is still very wheezy through all lung fields.  She states that she does feel significantly better, but may not be well enough to be discharged.  Will ambulate patient with her regular home O2 and see how she does.  Patient ambulates maintaining 95% pulse oxygenation on 2 L, which she wears 24/7.    She states that she felt okay while walking, and would rather go home than be admitted.  Will discharge with prednisone and Z-Pak.  Return precautions given.   Sonia HorsemanBrowning, Sonia [place], PA-C 10/15/17 0123    Pricilla LovelessGoldston, Scott, MD 10/16/17 [phone]

## 2017-10-15 NOTE — ED Notes (Signed)
Pt ambulated from the room to the end of the hall and back to the room 95% with 2L  Of oxygen

## 2017-10-15 NOTE — Discharge Instructions (Addendum)
Please return to the emergency department if your symptoms worsen.  Please take medications as directed.

## 2017-11-02 ENCOUNTER — Emergency Department (HOSPITAL_COMMUNITY): Payer: Medicaid Other

## 2017-11-02 ENCOUNTER — Other Ambulatory Visit: Payer: Self-pay

## 2017-11-02 ENCOUNTER — Inpatient Hospital Stay (HOSPITAL_COMMUNITY)
Admission: EM | Admit: 2017-11-02 | Discharge: 2017-11-04 | DRG: 190 | Disposition: A | Discharge status: Home / Self Care | Payer: Medicaid Other | Attending: Family Medicine | Admitting: Family Medicine

## 2017-11-02 ENCOUNTER — Encounter (HOSPITAL_COMMUNITY): Payer: Self-pay | Admitting: Emergency Medicine

## 2017-11-02 DIAGNOSIS — R000 Tachycardia, unspecified: Secondary | ICD-10-CM | POA: Diagnosis present

## 2017-11-02 DIAGNOSIS — F329 Major depressive disorder, single episode, unspecified: Secondary | ICD-10-CM | POA: Diagnosis present

## 2017-11-02 DIAGNOSIS — J4551 Severe persistent asthma with (acute) exacerbation: Secondary | ICD-10-CM | POA: Diagnosis not present

## 2017-11-02 DIAGNOSIS — J45901 Unspecified asthma with (acute) exacerbation: Secondary | ICD-10-CM | POA: Diagnosis present

## 2017-11-02 DIAGNOSIS — J441 Chronic obstructive pulmonary disease with (acute) exacerbation: Secondary | ICD-10-CM | POA: Diagnosis present

## 2017-11-02 DIAGNOSIS — G40909 Epilepsy, unspecified, not intractable, without status epilepticus: Secondary | ICD-10-CM | POA: Diagnosis present

## 2017-11-02 DIAGNOSIS — Z91040 Latex allergy status: Secondary | ICD-10-CM

## 2017-11-02 DIAGNOSIS — Z91013 Allergy to seafood: Secondary | ICD-10-CM | POA: Diagnosis not present

## 2017-11-02 DIAGNOSIS — Z7951 Long term (current) use of inhaled steroids: Secondary | ICD-10-CM | POA: Diagnosis not present

## 2017-11-02 DIAGNOSIS — J9621 Acute and chronic respiratory failure with hypoxia: Secondary | ICD-10-CM | POA: Diagnosis present

## 2017-11-02 DIAGNOSIS — Z91010 Allergy to peanuts: Secondary | ICD-10-CM | POA: Diagnosis not present

## 2017-11-02 DIAGNOSIS — Z96642 Presence of left artificial hip joint: Secondary | ICD-10-CM | POA: Diagnosis present

## 2017-11-02 DIAGNOSIS — Z91012 Allergy to eggs: Secondary | ICD-10-CM | POA: Diagnosis not present

## 2017-11-02 DIAGNOSIS — R0603 Acute respiratory distress: Secondary | ICD-10-CM

## 2017-11-02 DIAGNOSIS — J9601 Acute respiratory failure with hypoxia: Secondary | ICD-10-CM | POA: Diagnosis not present

## 2017-11-02 DIAGNOSIS — Z91041 Radiographic dye allergy status: Secondary | ICD-10-CM | POA: Diagnosis not present

## 2017-11-02 DIAGNOSIS — J9600 Acute respiratory failure, unspecified whether with hypoxia or hypercapnia: Secondary | ICD-10-CM

## 2017-11-02 DIAGNOSIS — Z9981 Dependence on supplemental oxygen: Secondary | ICD-10-CM | POA: Diagnosis not present

## 2017-11-02 DIAGNOSIS — Z79899 Other long term (current) drug therapy: Secondary | ICD-10-CM | POA: Diagnosis not present

## 2017-11-02 DIAGNOSIS — Z825 Family history of asthma and other chronic lower respiratory diseases: Secondary | ICD-10-CM | POA: Diagnosis not present

## 2017-11-02 DIAGNOSIS — K219 Gastro-esophageal reflux disease without esophagitis: Secondary | ICD-10-CM | POA: Diagnosis present

## 2017-11-02 DIAGNOSIS — R402413 Glasgow coma scale score 13-15, at hospital admission: Secondary | ICD-10-CM | POA: Diagnosis present

## 2017-11-02 DIAGNOSIS — Z833 Family history of diabetes mellitus: Secondary | ICD-10-CM | POA: Diagnosis not present

## 2017-11-02 DIAGNOSIS — Z8701 Personal history of pneumonia (recurrent): Secondary | ICD-10-CM

## 2017-11-02 DIAGNOSIS — I5032 Chronic diastolic (congestive) heart failure: Secondary | ICD-10-CM | POA: Diagnosis present

## 2017-11-02 DIAGNOSIS — Z8249 Family history of ischemic heart disease and other diseases of the circulatory system: Secondary | ICD-10-CM

## 2017-11-02 DIAGNOSIS — J9622 Acute and chronic respiratory failure with hypercapnia: Secondary | ICD-10-CM | POA: Diagnosis present

## 2017-11-02 DIAGNOSIS — Z91048 Other nonmedicinal substance allergy status: Secondary | ICD-10-CM | POA: Diagnosis not present

## 2017-11-02 LAB — I-STAT TROPONIN, ED: Troponin i, poc: 0 ng/mL (ref 0.00–0.08)

## 2017-11-02 LAB — CBC
HCT: 42.1 % (ref 36.0–46.0)
Hemoglobin: 12.7 g/dL (ref 12.0–15.0)
MCH: 28.2 pg (ref 26.0–34.0)
MCHC: 30.2 g/dL (ref 30.0–36.0)
MCV: 93.6 fL (ref 78.0–100.0)
Platelets: 284 10*3/uL (ref 150–400)
RBC: 4.5 MIL/uL (ref 3.87–5.11)
RDW: 15.6 % — ABNORMAL HIGH (ref 11.5–15.5)
WBC: 9.7 10*3/uL (ref 4.0–10.5)

## 2017-11-02 LAB — BASIC METABOLIC PANEL
Anion gap: 9 (ref 5–15)
BUN: 9 mg/dL (ref 8–23)
CO2: 30 mmol/L (ref 22–32)
Calcium: 10.1 mg/dL (ref 8.9–10.3)
Chloride: 105 mmol/L (ref 98–111)
Creatinine, Ser: 0.73 mg/dL (ref 0.44–1.00)
GFR calc Af Amer: >60 mL/min (ref >60)
GFR calc non Af Amer: >60 mL/min (ref >60)
Glucose, Bld: 99 mg/dL (ref 70–99)
Potassium: 4.3 mmol/L (ref 3.5–5.1)
Sodium: 144 mmol/L (ref 135–145)

## 2017-11-02 MED ORDER — MAGNESIUM SULFATE 2 GM/50ML IV SOLN
2.0000 g | Freq: Once | INTRAVENOUS | Status: AC
  Administered 2017-11-02: 2 g via INTRAVENOUS
  Filled 2017-11-02: qty 50

## 2017-11-02 MED ORDER — METHYLPREDNISOLONE SODIUM SUCC 125 MG IJ SOLR
125.0000 mg | Freq: Once | INTRAMUSCULAR | Status: AC
  Administered 2017-11-02: 125 mg via INTRAVENOUS
  Filled 2017-11-02: qty 2

## 2017-11-02 MED ORDER — KETOROLAC TROMETHAMINE 30 MG/ML IJ SOLN
15.0000 mg | Freq: Once | INTRAMUSCULAR | Status: AC
  Administered 2017-11-02: 15 mg via INTRAVENOUS
  Filled 2017-11-02: qty 1

## 2017-11-02 MED ORDER — ALBUTEROL SULFATE (2.5 MG/3ML) 0.083% IN NEBU
5.0000 mg | INHALATION_SOLUTION | Freq: Once | RESPIRATORY_TRACT | Status: AC
  Administered 2017-11-02: 5 mg via RESPIRATORY_TRACT
  Filled 2017-11-02: qty 6

## 2017-11-02 MED ORDER — LORAZEPAM 2 MG/ML IJ SOLN
0.5000 mg | Freq: Once | INTRAMUSCULAR | Status: AC
  Administered 2017-11-02: 0.5 mg via INTRAVENOUS
  Filled 2017-11-02: qty 1

## 2017-11-02 NOTE — ED Provider Notes (Signed)
[facility]-EMERGENCY DEPT Provider Note   CSN: [id number] Arrival date & time: 11/02/17  2140     History   Chief Complaint Chief Complaint  Patient presents with  . Shortness of Breath    HPI Sonia Drake is a 62 y.o. female.  HPI Patient presents in respiratory distress. Patient can barely phonate, notes that she is very uncomfortable, having difficulty breathing, has tightness throughout her chest. It is unclear when this began, but it seems as though nothing is making it better. Medially after the initial evaluation the patient received nonrebreather mask for supplemental oxygen given her increased work of breathing, respiratory distress. Level 5 caveat secondary to acuity of condition.  Per report the patient has history of asthma exacerbations requiring intubation in the past.  Past Medical History:  Diagnosis Date  . Arthritis    "right knee; left hip" (04/07/2017)  . Asthma   . CHF (congestive heart failure) (HCC)    reported treatment for fluid overload in the setting of asthma exacerbation ~ 1998  . COPD (chronic obstructive pulmonary disease) (HCC)   . 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 Dyspnea   . Dysrhythmia    occasional skipped beats  . GERD (gastroesophageal reflux disease)   . On home oxygen therapy    "I sleep with 2L" (04/07/2017)  . Pneumonia    "several times" (04/07/2017)  . Seizures (HCC) 2003 X 1   unknown cause    Patient Active Problem List   Diagnosis Date Noted  . Chronic diastolic CHF (congestive heart failure) (HCC) 09/19/2017  . Primary osteoarthritis of left hip 04/07/2017  . Osteoarthritis of left hip 04/02/2017  . Lung nodule 02/04/2016  . COPD exacerbation (HCC) 07/21/2015  . Influenza B 04/23/2015  . Asthma exacerbation 04/22/2015  . Hypokalemia 04/22/2015  . 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 Influenza with pneumonia 07/03/2014  . Multiple lung nodules on CT 07/03/2014  .  Asthma 07/02/2014  . Hyperglycemia 07/02/2014  . Chronic obstructive pulmonary disease with acute exacerbation (HCC)   . Prolonged QT interval 06/29/2014    Past Surgical History:  Procedure Laterality Date  . JOINT REPLACEMENT    . KNEE ARTHROSCOPY Right 2017  . 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 TOTAL HIP ARTHROPLASTY Left 04/07/2017   Procedure: LEFT TOTAL HIP ARTHROPLASTY ANTERIOR APPROACH;  Surgeon: Gean Birchwood, MD;  Location: [facility] OR;  Service: Orthopedics;  Laterality: Left;     OB History   None      Home Medications    Prior to Admission medications   Medication Sig Start Date End Date Taking? Authorizing Provider  albuterol (PROVENTIL HFA;VENTOLIN HFA) 108 (90 Base) MCG/ACT inhaler Inhale 1-2 puffs into the lungs every 6 (six) hours as needed for wheezing or shortness of breath. Patient taking differently: Inhale 2 puffs into the lungs every 6 (six) hours as needed for wheezing or shortness of breath.  02/18/17   Dorothea Ogle, MD  albuterol (PROVENTIL) (2.5 MG/3ML) 0.083% nebulizer solution Take 2.5 mg by nebulization every 6 (six) hours as needed for wheezing or shortness of breath.    [provider]  azithromycin (ZITHROMAX) 250 MG tablet Take 1 tablet (250 mg total) by mouth daily. Take first 2 tablets together, then 1 every day until finished. 10/15/17   Roxy Horseman, PA-C  budesonide-formoterol (SYMBICORT) 160-4.5 MCG/ACT inhaler Inhale 2 puffs into the lungs 2 (two) times daily.    [provider]  CALCIUM PO Take  1 tablet by mouth daily.    [provider]  Cholecalciferol (VITAMIN D3 PO) Take 1 tablet by mouth daily.    [provider]  montelukast (SINGULAIR) 10 MG tablet Take 10 mg by mouth daily.     [provider]  pantoprazole (PROTONIX) 20 MG tablet Take 20 mg by mouth daily as needed for indigestion.    [provider]  predniSONE (DELTASONE) 20 MG tablet Take 2 tablets (40 mg total) by mouth  daily. 10/15/17   Roxy HorsemanBrowning, Katori Wirsing, PA-C  tiotropium (SPIRIVA) 18 MCG inhalation capsule Place 18 mcg into inhaler and inhale daily.     [provider]  triamcinolone lotion (KENALOG) 0.1 % Apply 1 application topically 2 (two) times daily as needed (for rash).  08/29/15   [provider]    Family History Family History  Problem Relation Age of Onset  . Heart disease Mother   . Heart disease Father   . Diabetes Father   . Asthma Father   . Diabetes Brother   . Diabetes Sister   . Asthma Brother   . Asthma Sister   . Hypertension Sister     Social History Social History   Tobacco Use  . Smoking status: Never Smoker  . Smokeless tobacco: Never Used  Substance Use Topics  . Alcohol use: No  . Drug use: No     Allergies   Contrast media [iodinated diagnostic agents]; Peanuts [peanut oil]; Shellfish allergy; Soap; Latex; and Eggs or egg-derived products   Review of Systems Review of Systems  Unable to perform ROS: Acuity of condition     Physical Exam Updated Vital Signs BP (!) 90/42   Pulse 88   Resp (!) 21   SpO2 99%   Physical Exam  Constitutional: She is oriented to person, place, and time. She appears well-developed and well-nourished. She appears ill. She appears distressed.  Ill-appearing elderly female awake and alert, but with difficulty speaking secondary to dyspnea  HENT:  Head: Normocephalic and atraumatic.  Eyes: Conjunctivae and EOM are normal.  Cardiovascular: Regular rhythm. Tachycardia present.  Pulmonary/Chest: Accessory muscle usage present. No stridor. Tachypnea noted. She is in respiratory distress. She has decreased breath sounds. She has wheezes.  Abdominal: She exhibits no distension.  Musculoskeletal: She exhibits no edema.  Neurological: She is alert and oriented to person, place, and time. No cranial nerve deficit.  Skin: Skin is warm and dry.  Psychiatric: She has a normal mood and affect.  Nursing note and vitals  reviewed.    ED Treatments / Results  Labs (all labs ordered are listed, but only abnormal results are displayed) Labs Reviewed  CBC - Abnormal; Notable for the following components:      Result Value   RDW 15.6 (*)    All other components within normal limits  BASIC METABOLIC PANEL  BRAIN NATRIURETIC PEPTIDE  I-STAT TROPONIN, ED    EKG EKG Interpretation  Date/Time:  Sunday November 02 2017 21:53:54 EDT Ventricular Rate:  99 PR Interval:    QRS Duration: 81 QT Interval:  319 QTC Calculation: 410 R Axis:   77 Text Interpretation:  Sinus rhythm RSR' in V1 or V2, probably normal variant Borderline repolarization abnormality No significant change since last tracing Abnormal ekg Confirmed by Gerhard MunchLockwood, Kortni Hasten [phone]([id number]) on 11/02/2017 10:32:28 PM   Radiology Dg Chest 2 View  Result Date: 11/02/2017 CLINICAL DATA:  Sob, Hx of asthma, Nurse stated pt unstable to bring to xray. pcxr done EXAM: CHEST -  2 VIEW COMPARISON:  10/14/2017 FINDINGS: Patient rotated minimally right. Midline trachea. Normal heart size and mediastinal contours. No pleural effusion or pneumothorax. Clear lungs. IMPRESSION: No acute cardiopulmonary disease. Electronically Signed   By: Jeronimo Greaves M.D.   On: 11/02/2017 22:33    Procedures Procedures (including critical care time)  Medications Ordered in ED Medications  albuterol (PROVENTIL) (2.5 MG/3ML) 0.083% nebulizer solution 5 mg (5 mg Nebulization Given 11/02/17 2235)  methylPREDNISolone sodium succinate (SOLU-MEDROL) 125 mg/2 mL injection 125 mg (125 mg Intravenous Given 11/02/17 2158)  ketorolac (TORADOL) 30 MG/ML injection 15 mg (15 mg Intravenous Given 11/02/17 2223)  LORazepam (ATIVAN) injection 0.5 mg (0.5 mg Intravenous Given 11/02/17 2223)  magnesium sulfate IVPB 2 g 50 mL (0 g Intravenous Stopped 11/02/17 2238)     Initial Impression / Assessment and Plan / ED Course  I have reviewed the triage vital signs and the nursing notes.  Pertinent labs &  imaging results that were available during my care of the patient were reviewed by me and considered in my medical decision making (see chart for details). Medially after initial evaluation, initiation of nonrebreather mask, patient had continuous monitoring, and received albuterol, steroids, BiPAP was then initiated.  Chart review notable for evaluation 3 weeks ago for similar circumstances.   Update: Patient substantially better, though she requests something to calm her nerves, and was provided Ativan. Lung sounds more clear, though still with abnormality right greater than left. Patient is tolerating BiPAP well.  11:19 PM Patient in no distress, sleepy, but awakens easily, tolerating BiPAP well, with normalized vital signs, resolution of tachycardia. Patient's evaluation generally reassuring as far, normal troponin, nonischemic EKG, no leukocytosis, and x-ray consistent with pneumonia. Will require admission given her presentation respiratory distress, concern for acute exacerbation of asthma.  Final Clinical Impressions(s) / ED Diagnoses  Respiratory distress  CRITICAL CARE Performed by: Gerhard Munch Total critical care time: 35 minutes Critical care time was exclusive of separately billable procedures and treating other patients. Critical care was necessary to treat or prevent imminent or life-threatening deterioration. Critical care was time spent personally by me on the following activities: development of treatment plan with patient and/or surrogate as well as nursing, discussions with consultants, evaluation of patient's response to treatment, examination of patient, obtaining history from patient or surrogate, ordering and performing treatments and interventions, ordering and review of laboratory studies, ordering and review of radiographic studies, pulse oximetry and re-evaluation of patient's condition.    Gerhard Munch, MD 11/02/17 [phone]

## 2017-11-02 NOTE — ED Triage Notes (Signed)
Patient presents POV with asthma exacerbation. Patient is wheezing in all fields audibly. EDP at bedside.

## 2017-11-02 NOTE — ED Notes (Signed)
Patient presents from triage in acute respiratory distress/diffuse exp audible wheezing/tripoding/unable to speak

## 2017-11-03 ENCOUNTER — Encounter (HOSPITAL_COMMUNITY): Payer: Self-pay | Admitting: Internal Medicine

## 2017-11-03 DIAGNOSIS — J4551 Severe persistent asthma with (acute) exacerbation: Secondary | ICD-10-CM

## 2017-11-03 DIAGNOSIS — J9621 Acute and chronic respiratory failure with hypoxia: Secondary | ICD-10-CM | POA: Diagnosis present

## 2017-11-03 DIAGNOSIS — J9601 Acute respiratory failure with hypoxia: Secondary | ICD-10-CM

## 2017-11-03 DIAGNOSIS — R0603 Acute respiratory distress: Secondary | ICD-10-CM

## 2017-11-03 DIAGNOSIS — J9600 Acute respiratory failure, unspecified whether with hypoxia or hypercapnia: Secondary | ICD-10-CM

## 2017-11-03 LAB — PROCALCITONIN: Procalcitonin: <0.1 ng/mL

## 2017-11-03 LAB — GLUCOSE, CAPILLARY
Glucose-Capillary: 115 mg/dL — ABNORMAL HIGH (ref 70–99)
Glucose-Capillary: 140 mg/dL — ABNORMAL HIGH (ref 70–99)
Glucose-Capillary: 177 mg/dL — ABNORMAL HIGH (ref 70–99)

## 2017-11-03 LAB — BLOOD GAS, ARTERIAL
Acid-Base Excess: 2 mmol/L (ref 0.0–2.0)
Acid-Base Excess: 2.1 mmol/L — ABNORMAL HIGH (ref 0.0–2.0)
Bicarbonate: 28.6 mmol/L — ABNORMAL HIGH (ref 20.0–28.0)
Bicarbonate: 30.2 mmol/L — ABNORMAL HIGH (ref 20.0–28.0)
Delivery systems: POSITIVE
Delivery systems: POSITIVE
Drawn by: 51425
Drawn by: 51425
Expiratory PAP: 6
Expiratory PAP: 6
FIO2: 30
FIO2: 30
Inspiratory PAP: 12
Inspiratory PAP: 12
O2 Saturation: 95.1 %
O2 Saturation: 97.7 %
Patient temperature: 98.6
Patient temperature: 98.6
RATE: 6 resp/min
RATE: 8 resp/min
pCO2 arterial: 55.6 mmHg — ABNORMAL HIGH (ref 32.0–48.0)
pCO2 arterial: 65.4 mmHg (ref 32.0–48.0)
pH, Arterial: 7.287 — ABNORMAL LOW (ref 7.350–7.450)
pH, Arterial: 7.331 — ABNORMAL LOW (ref 7.350–7.450)
pO2, Arterial: 108 mmHg (ref 83.0–108.0)
pO2, Arterial: 86.6 mmHg (ref 83.0–108.0)

## 2017-11-03 LAB — BRAIN NATRIURETIC PEPTIDE: B Natriuretic Peptide: 24.6 pg/mL (ref 0.0–100.0)

## 2017-11-03 LAB — CREATININE, SERUM
Creatinine, Ser: 0.73 mg/dL (ref 0.44–1.00)
GFR calc Af Amer: >60 mL/min (ref >60)
GFR calc non Af Amer: >60 mL/min (ref >60)

## 2017-11-03 LAB — CBC
HCT: 41.8 % (ref 36.0–46.0)
HCT: 41.2 % (ref 36.0–46.0)
Hemoglobin: 12.7 g/dL (ref 12.0–15.0)
Hemoglobin: 12.3 g/dL (ref 12.0–15.0)
MCH: 28.5 pg (ref 26.0–34.0)
MCH: 27.9 pg (ref 26.0–34.0)
MCHC: 30.4 g/dL (ref 30.0–36.0)
MCHC: 29.9 g/dL — ABNORMAL LOW (ref 30.0–36.0)
MCV: 93.7 fL (ref 78.0–100.0)
MCV: 93.4 fL (ref 78.0–100.0)
Platelets: 263 10*3/uL (ref 150–400)
Platelets: 264 10*3/uL (ref 150–400)
RBC: 4.46 MIL/uL (ref 3.87–5.11)
RBC: 4.41 MIL/uL (ref 3.87–5.11)
RDW: 15.6 % — ABNORMAL HIGH (ref 11.5–15.5)
RDW: 15.4 % (ref 11.5–15.5)
WBC: 9.2 10*3/uL (ref 4.0–10.5)
WBC: 7.7 10*3/uL (ref 4.0–10.5)

## 2017-11-03 LAB — CBG MONITORING, ED: Glucose-Capillary: 144 mg/dL — ABNORMAL HIGH (ref 70–99)

## 2017-11-03 LAB — BASIC METABOLIC PANEL
Anion gap: 8 (ref 5–15)
BUN: 8 mg/dL (ref 8–23)
CO2: 30 mmol/L (ref 22–32)
Calcium: 10 mg/dL (ref 8.9–10.3)
Chloride: 109 mmol/L (ref 98–111)
Creatinine, Ser: 0.59 mg/dL (ref 0.44–1.00)
GFR calc Af Amer: >60 mL/min (ref >60)
GFR calc non Af Amer: >60 mL/min (ref >60)
Glucose, Bld: 180 mg/dL — ABNORMAL HIGH (ref 70–99)
Potassium: 4.9 mmol/L (ref 3.5–5.1)
Sodium: 147 mmol/L — ABNORMAL HIGH (ref 135–145)

## 2017-11-03 LAB — HEPATIC FUNCTION PANEL
ALT: 17 U/L (ref 0–44)
AST: 22 U/L (ref 15–41)
Albumin: 3.6 g/dL (ref 3.5–5.0)
Alkaline Phosphatase: 83 U/L (ref 38–126)
Bilirubin, Direct: 0.1 mg/dL (ref 0.0–0.2)
Indirect Bilirubin: 0.3 mg/dL (ref 0.3–0.9)
Total Bilirubin: 0.4 mg/dL (ref 0.3–1.2)
Total Protein: 7.3 g/dL (ref 6.5–8.1)

## 2017-11-03 LAB — TROPONIN I
Troponin I: <0.03 ng/mL (ref <0.03)
Troponin I: <0.03 ng/mL (ref <0.03)

## 2017-11-03 LAB — MAGNESIUM: Magnesium: 2.2 mg/dL (ref 1.7–2.4)

## 2017-11-03 MED ORDER — ALBUTEROL SULFATE (2.5 MG/3ML) 0.083% IN NEBU
2.5000 mg | INHALATION_SOLUTION | RESPIRATORY_TRACT | Status: DC | PRN
  Administered 2017-11-03: 2.5 mg via RESPIRATORY_TRACT
  Filled 2017-11-03: qty 3

## 2017-11-03 MED ORDER — IPRATROPIUM-ALBUTEROL 0.5-2.5 (3) MG/3ML IN SOLN
3.0000 mL | RESPIRATORY_TRACT | Status: DC
  Administered 2017-11-03 – 2017-11-04 (×7): 3 mL via RESPIRATORY_TRACT
  Filled 2017-11-03 (×9): qty 3

## 2017-11-03 MED ORDER — HYDROXYZINE HCL 25 MG PO TABS: 25.0000 mg | ORAL_TABLET | Freq: Four times a day (QID) | ORAL | Status: DC | PRN

## 2017-11-03 MED ORDER — MONTELUKAST SODIUM 10 MG PO TABS
10.0000 mg | ORAL_TABLET | Freq: Every day | ORAL | Status: DC
  Administered 2017-11-03: 10 mg via ORAL
  Filled 2017-11-03: qty 1

## 2017-11-03 MED ORDER — METHYLPREDNISOLONE SODIUM SUCC 125 MG IJ SOLR
60.0000 mg | Freq: Four times a day (QID) | INTRAMUSCULAR | Status: DC
  Administered 2017-11-03 – 2017-11-04 (×4): 60 mg via INTRAVENOUS
  Filled 2017-11-03 (×4): qty 2

## 2017-11-03 MED ORDER — ALBUTEROL SULFATE (2.5 MG/3ML) 0.083% IN NEBU: 2.5000 mg | INHALATION_SOLUTION | RESPIRATORY_TRACT | Status: DC

## 2017-11-03 MED ORDER — ENOXAPARIN SODIUM 40 MG/0.4ML ~~LOC~~ SOLN
40.0000 mg | SUBCUTANEOUS | Status: DC
  Filled 2017-11-03: qty 0.4

## 2017-11-03 MED ORDER — ARFORMOTEROL TARTRATE 15 MCG/2ML IN NEBU
15.0000 ug | INHALATION_SOLUTION | Freq: Two times a day (BID) | RESPIRATORY_TRACT | Status: DC
  Administered 2017-11-03 – 2017-11-04 (×2): 15 ug via RESPIRATORY_TRACT
  Filled 2017-11-03 (×2): qty 2

## 2017-11-03 MED ORDER — BUDESONIDE 0.25 MG/2ML IN SUSP: 0.2500 mg | Freq: Two times a day (BID) | RESPIRATORY_TRACT | Status: DC

## 2017-11-03 MED ORDER — ACETAMINOPHEN 325 MG PO TABS: 650.0000 mg | ORAL_TABLET | Freq: Four times a day (QID) | ORAL | Status: DC | PRN

## 2017-11-03 MED ORDER — METHYLPREDNISOLONE SODIUM SUCC 40 MG IJ SOLR
40.0000 mg | Freq: Two times a day (BID) | INTRAMUSCULAR | Status: DC
  Administered 2017-11-03: 40 mg via INTRAVENOUS
  Filled 2017-11-03: qty 1

## 2017-11-03 MED ORDER — LEVOFLOXACIN IN D5W 750 MG/150ML IV SOLN
750.0000 mg | Freq: Every day | INTRAVENOUS | Status: DC
  Administered 2017-11-03 – 2017-11-04 (×2): 750 mg via INTRAVENOUS
  Filled 2017-11-03 (×2): qty 150

## 2017-11-03 MED ORDER — ACETAMINOPHEN 650 MG RE SUPP: 650.0000 mg | Freq: Four times a day (QID) | RECTAL | Status: DC | PRN

## 2017-11-03 MED ORDER — ONDANSETRON HCL 4 MG PO TABS
4.0000 mg | ORAL_TABLET | Freq: Four times a day (QID) | ORAL | Status: DC | PRN
  Filled 2017-11-03: qty 1

## 2017-11-03 MED ORDER — BUDESONIDE 0.5 MG/2ML IN SUSP
0.5000 mg | Freq: Two times a day (BID) | RESPIRATORY_TRACT | Status: DC
  Administered 2017-11-03 – 2017-11-04 (×3): 0.5 mg via RESPIRATORY_TRACT
  Filled 2017-11-03 (×3): qty 2

## 2017-11-03 MED ORDER — IPRATROPIUM BROMIDE 0.02 % IN SOLN: 0.5000 mg | RESPIRATORY_TRACT | Status: DC

## 2017-11-03 MED ORDER — ONDANSETRON HCL 4 MG/2ML IJ SOLN: 4.0000 mg | Freq: Four times a day (QID) | INTRAMUSCULAR | Status: DC | PRN

## 2017-11-03 MED ORDER — ALBUTEROL (5 MG/ML) CONTINUOUS INHALATION SOLN
10.0000 mg/h | INHALATION_SOLUTION | RESPIRATORY_TRACT | Status: DC
  Administered 2017-11-03: 10 mg/h via RESPIRATORY_TRACT
  Filled 2017-11-03: qty 20

## 2017-11-03 NOTE — Progress Notes (Signed)
RT called to PT room via RN to remove PT from bipap due to doing well. Second call from RN to PT room due to difficulty breathing. RT entered PT room - PT off BiPAP and taking neb treatment. MD at bedside and stated to leave PT off BiPAP because Sp02 is maintaining. Post neb treatment - RT placed PT on 2 lpm [place] (PT 02 dependant per PT statement). BiPAP order has been changed from continuous to PRN (equipment remains at bedside).

## 2017-11-03 NOTE — Progress Notes (Signed)
Pharmacy Antibiotic Note  Niel HummerMary J Knapik is a 62 y.o. female admitted on 11/02/2017 with pneumonia.  Pharmacy has been consulted for levaquin dosing.  Plan: Levaquin 750 mg IV q24h F/u scr/cultures     No data recorded.  Recent Labs  Lab 11/02/17 2154  WBC 9.7  CREATININE 0.73    CrCl cannot be calculated (Unknown ideal weight.).    Allergies  Allergen Reactions  . Contrast Media [Iodinated Diagnostic Agents] Anaphylaxis and Other (See Comments)    ** CARDIAC ARREST **  . Peanuts [Peanut Oil] Shortness Of Breath and Swelling  . Shellfish Allergy Shortness Of Breath and Swelling  . Soap Shortness Of Breath and Swelling    LAUNDRY DETERGENT  . Latex Hives  . Eggs Or Egg-Derived Products Rash    Antimicrobials this admission: 7/15 levaquin >>    >>   Dose adjustments this admission:   Microbiology results:  BCx:   UCx:    Sputum:    MRSA PCR:   Thank you for allowing pharmacy to be a part of this patient's care.  Lorenza EvangelistGreen, Dania Marsan R 11/03/2017 2:49 AM

## 2017-11-03 NOTE — Consult Note (Signed)
PULMONARY / CRITICAL CARE MEDICINE   Name: Sonia Drake MRN: [id number] DOB: 04-Mar-1956    ADMISSION DATE:  11/02/2017 CONSULTATION DATE:  11/03/2017  REFERRING MD:  Dr. Jeraldine Loots   CHIEF COMPLAINT:  Dyspnea  HISTORY OF PRESENT ILLNESS:   62 year old female with PMH of Asthma, COPD, GERD, on 2L [place] at baseline, Depression, Diastolic HF (EF 55-60), Seizures  Presented to ED on 7/14 with dyspnea, wheezing, and tripoding. Given 125 mg Solu-Medrol and placed on BiPAP with continuous albuterol. Continues on NIPPV at 30% FiO2 saturating 97% in no acute distress and states that she feels better than when she first presented. She complains of anterior chest pain which is reproducible, + cough + wheezing and denies nausea, vomiting,abdominal pain, diarrhea or constipation. Denies flu like illness in the days prior and denies fevers and chills.     PAST MEDICAL HISTORY :  She  has a past medical history of Arthritis, Asthma, CHF (congestive heart failure) (HCC), COPD (chronic obstructive pulmonary disease) (HCC), Depression, Dyspnea, Dysrhythmia, GERD (gastroesophageal reflux disease), On home oxygen therapy, Pneumonia, and Seizures (HCC) (2003 X 1).  PAST SURGICAL HISTORY: She  has a past surgical history that includes Knee arthroscopy (Right, 2017); Joint replacement; Total hip arthroplasty (Left, 04/07/2017); and Total hip arthroplasty (Left, 04/07/2017).  Allergies  Allergen Reactions  . Contrast Media [Iodinated Diagnostic Agents] Anaphylaxis and Other (See Comments)    ** CARDIAC ARREST **  . Peanuts [Peanut Oil] Shortness Of Breath and Swelling  . Shellfish Allergy Shortness Of Breath and Swelling  . Soap Shortness Of Breath and Swelling    LAUNDRY DETERGENT  . Latex Hives  . Eggs Or Egg-Derived Products Rash    No current facility-administered medications on file prior to encounter.    Current Outpatient Medications on File Prior to Encounter  Medication Sig  . albuterol  (PROVENTIL HFA;VENTOLIN HFA) 108 (90 Base) MCG/ACT inhaler Inhale 1-2 puffs into the lungs every 6 (six) hours as needed for wheezing or shortness of breath. (Patient taking differently: Inhale 2 puffs into the lungs every 6 (six) hours as needed for wheezing or shortness of breath. )  . albuterol (PROVENTIL) (2.5 MG/3ML) 0.083% nebulizer solution Take 2.5 mg by nebulization every 6 (six) hours as needed for wheezing or shortness of breath.  Marland Kitchen azithromycin (ZITHROMAX) 250 MG tablet Take 1 tablet (250 mg total) by mouth daily. Take first 2 tablets together, then 1 every day until finished.  . budesonide-formoterol (SYMBICORT) 160-4.5 MCG/ACT inhaler Inhale 2 puffs into the lungs 2 (two) times daily.  Marland Kitchen CALCIUM PO Take 1 tablet by mouth daily.  . Cholecalciferol (VITAMIN D3 PO) Take 1 tablet by mouth daily.  . montelukast (SINGULAIR) 10 MG tablet Take 10 mg by mouth daily.   . pantoprazole (PROTONIX) 20 MG tablet Take 20 mg by mouth daily as needed for indigestion.  . predniSONE (DELTASONE) 20 MG tablet Take 2 tablets (40 mg total) by mouth daily.  Marland Kitchen tiotropium (SPIRIVA) 18 MCG inhalation capsule Place 18 mcg into inhaler and inhale daily.   Marland Kitchen triamcinolone lotion (KENALOG) 0.1 % Apply 1 application topically 2 (two) times daily as needed (for rash).     FAMILY HISTORY:  Her family history includes Asthma in her brother, father, and sister; Diabetes in her brother, father, and sister; Heart disease in her father and mother; Hypertension in her sister.  SOCIAL HISTORY: She  reports that she has never smoked. She has never used smokeless tobacco. She reports that she  does not drink alcohol or use drugs.  REVIEW OF SYSTEMS:   Marland Kitchen.Marland Kitchen.Review of Systems  Constitutional: Negative.   HENT: Negative for congestion, ear pain, nosebleeds and sore throat.   Respiratory: Positive for cough, sputum production and wheezing.   Cardiovascular: Positive for chest pain.  Gastrointestinal: Negative for abdominal pain,  constipation, diarrhea, nausea and vomiting.  Skin: Negative.   Neurological: Negative for dizziness, loss of consciousness and weakness.  Psychiatric/Behavioral: Positive for depression.    VITAL SIGNS: BP 138/82   Pulse 85   Resp (!) 21   SpO2 100%   HEMODYNAMICS:    VENTILATOR SETTINGS: FiO2 (%):  [30 %-40 %] 30 %  INTAKE / OUTPUT: No intake/output data recorded.  PHYSICAL EXAMINATION: General:  In no acute distress continues on NIPPV Neuro:  No focal deficits GCS 15 HEENT:  Full face NIPPV mask with minimal leak, no evidence of trauma Cardiovascular:  S1 and S2 appreciated RRR. Tenderness on palpation of sternum  Lungs:  Bilateral exp wheezing throughout lung fields + cough Abdomen:  Soft non tender + BS  Musculoskeletal:  No lower ext edema Skin:  Grossly intact  LABS:  BMET Recent Labs  Lab 11/02/17 2154  NA 144  K 4.3  CL 105  CO2 30  BUN 9  CREATININE 0.73  GLUCOSE 99    Electrolytes Recent Labs  Lab 11/02/17 2154  CALCIUM 10.1    CBC Recent Labs  Lab 11/02/17 2154  WBC 9.7  HGB 12.7  HCT 42.1  PLT 284    Coag's No results for input(s): APTT, INR in the last 168 hours.  Sepsis Markers No results for input(s): LATICACIDVEN, PROCALCITON, O2SATVEN in the last 168 hours.  ABG Recent Labs  Lab 11/03/17 0050  PHART 7.287*  PCO2ART 65.4*  PO2ART 86.6    Liver Enzymes No results for input(s): AST, ALT, ALKPHOS, BILITOT, ALBUMIN in the last 168 hours.  Cardiac Enzymes No results for input(s): TROPONINI, PROBNP in the last 168 hours.  Glucose No results for input(s): GLUCAP in the last 168 hours.  Imaging Dg Chest 2 View  Result Date: 11/02/2017 CLINICAL DATA:  Sob, Hx of asthma, Nurse stated pt unstable to bring to xray. pcxr done EXAM: CHEST - 2 VIEW COMPARISON:  10/14/2017 FINDINGS: Patient rotated minimally right. Midline trachea. Normal heart size and mediastinal contours. No pleural effusion or pneumothorax. Clear lungs.  IMPRESSION: No acute cardiopulmonary disease. Electronically Signed   By: Jeronimo GreavesKyle  Talbot M.D.   On: 11/02/2017 22:33     STUDIES:  CXR 7/14 > No acute  CULTURES: pending  ANTIBIOTICS: .Marland Kitchen. Anti-infectives (From admission, onward)   Start     Dose/Rate Route Frequency Ordered Stop   11/03/17 0300  levofloxacin (LEVAQUIN) IVPB 750 mg     750 mg 100 mL/hr over 90 Minutes Intravenous Daily 11/03/17 0245         SIGNIFICANT EVENTS: 7/14 > Presents to ED   LINES/TUBES: ETT 7/15 >>   DISCUSSION: 62 yr old female with h/o obstructive pulmonary disease ( Asthma and COPD) presents with wheezing, SOB, cough and pleuritic chest pain. Post steroids and bronchodilators with initiation of NIPPV pt has markedly improved. PCCM was consulted by hospitalist  Pt does not require endotracheal intubation and does not require critical care management at this time.   ASSESSMENT / PLAN:  Acute on Chronic Hypoxic/Hypercarbic Respiratory Failure in setting of Asthma vs COPD Excerebration  -Home 2L [place]  P:   NIPPV at 30% FiO2 Not in  any distress, bilateral wheezing Solumedrol IV started  Continue bronchodilators  On Levaquin- check PCT and f/u cx deescalate when possible Pulmonary Hygiene  Trend ABG  CHF (G2DD EF 60-65)  P:  Cardiac Monitoring  Hemodynamically stable   SUP P:   NPO PPI   VTE P:  Trend CBC Heparin SQ SCDs   H/O Seizures, Depression  P:   AAOx 3 in NAD  FAMILY  None at bedside at time of evaluation  Newell Coral MD Pulmonary and Critical Care Medicine 11/03/2017, 2:00 AM

## 2017-11-03 NOTE — Clinical Social Work Note (Signed)
Clinical Social Work Assessment  Patient Details  Name: Sonia Drake MRN: [id number] Date of Birth: 1956-01-05  Date of referral:  11/03/17               Reason for consult:  Financial Concerns(Patient reporting concerns with Social Services due to increase in rent through [employer].)                Permission sought to share information with:    Permission granted to share information::     Name::        Agency::     Relationship::     Contact Information:     Housing/Transportation Living arrangements for the past 2 months:  Apartment(Patient lives with daughter and two grandchildren in apartment through [employer]) Source of Information:  Patient Patient Interpreter Needed:  None Criminal Activity/Legal Involvement Pertinent to Current Situation/Hospitalization:  No - Comment as needed Significant Relationships:  Adult Children, Other Family Members Lives with:  Adult Children, Minor Children(Patient lives with adult daughter and 2 grandchildren ages 93 and 70) Do you feel safe going back to the place where you live?    Need for family participation in patient care:     Care giving concerns:  CSW consulted to see patient who reports frustrations with Social Services- [employer] due to an increase in rent and not feeling heard.   Social Worker assessment / plan:  CSW spoke with patient at bedside who reported to CSW that she is feeling overwhelmed due to financial stressors. Patient reports that she receives an SSI check for a total of $849 monthly and Food Stamps check of $474. Patient is currently living with her adult daughter and two grandchildren in an apartment through [employer]. Patient reports that she was paying $306 but that her rent was recently increased to $449. Patient has requested to have a hearing with [employer] concerning the increase in rent but has yet to hear a response. Patient reports that she has already had to make sacrifices such as, canceling the families  Internet and only using certain at-home treatments when necessary and states that she doesn't know what else she can do. Patient states that her daughter was also receiving SSI but that since starting employment, her check has been reduced. Patient states that her daughter is employed but only works 0-15 hours a week and that it is not consistent. Patient states that her current food stamps amount is not enough for 4 people and that she doesn't want her grandchildren to go to bed hungry. Patient states that she has tried food pantries in the past but that it does not work with her current diet restrictions and that the food she can get is limited. CSW will provide resources for financial assistance.  Employment status:  Disabled (Comment on whether or not currently receiving Disability)(Patient currently receives SSI check of $849) Insurance information:  Medicaid In [place] PT Recommendations:    Information / Referral to community resources:     Patient/Family's Response to care:  Patient was appreciative of CSW and thanked CSW for listening to her. Patient states that she feels like a huge weight has been lifted off of her shoulders.  Patient/Family's Understanding of and Emotional Response to Diagnosis, Current Treatment, and Prognosis:  Patient was moved from ED to inpatient for her respiratory distress.  Emotional Assessment Appearance:  Appears stated age Attitude/Demeanor/Rapport:    Affect (typically observed):  Overwhelmed, Tearful/Crying, Frustrated Orientation:  Oriented to Self,  Oriented to Situation, Oriented to Place, Oriented to  Time Alcohol / Substance use:    Psych involvement (Current and /or in the community):  No (Comment)  Discharge Needs  Concerns to be addressed:  Financial / Insurance Concerns Readmission within the last 30 days:  No Current discharge risk:  None Barriers to Discharge:  No Barriers Identified   Archie BalboaMackenzie  Irwin, LCSWA 11/03/2017, 10:54 AM

## 2017-11-03 NOTE — Progress Notes (Signed)
[facility]   Patient admitted after midnight by Dr. Toniann FailKakrakandy H&P for further details.  Patient seen and chart reviewed.  Patient is a 62 year old female with medical history of asthma who presented to the emergency department complaining of wheezing productive cough for 1 day prior to admission.  Upon ED evaluation ABG was done which showed pH of 7.28 and PCO2 of 65 patient was placed on BiPAP, given IV Solu-Medrol and nebulizer treatments.  Patient was admitted with working diagnosis of acute respiratory failure with hypoxia.  Upon my evaluation patient is off BiPAP.  Saturation above 91%.  On exam diffuse expiratory wheezing.  Will increase Solu-Medrol dose, add Brovana to Pulmicort and continue duo nebs.  Negative will discontinue antibiotics.  Will continue to monitor closely.  Latrelle DodrillEdwin Silva, MD

## 2017-11-03 NOTE — ED Notes (Signed)
ED TO INPATIENT HANDOFF REPORT  Name/Age/Gender Sonia Drake 62 y.o. female  Code Status    Code Status Orders  (From admission, onward)        Start     Ordered   11/03/17 0217  Full code  Continuous     11/03/17 0218    Code Status History    Date Active Date Inactive Code Status Order ID Comments User Context   09/19/2017 1808 09/20/2017 1657 Full Code [id number]  Mariel Aloe, MD Inpatient   04/07/2017 1430 04/09/2017 2229 Full Code [id number]  Neldon Newport Inpatient   02/16/2017 1415 02/18/2017 1739 Full Code [id number]  Elwin Mocha, MD ED   02/04/2016 0752 02/05/2016 1413 Full Code [id number]  Elgergawy, Silver Huguenin, MD ED   07/21/2015 2317 07/24/2015 1636 Full Code [id number]  Toy Baker, MD ED   04/22/2015 1356 04/24/2015 1422 Full Code [id number]  Rama, Venetia Maxon, MD Inpatient   07/02/2014 1633 07/07/2014 1531 Full Code [id number]  Nat Math, MD Inpatient   06/29/2014 0055 06/30/2014 2046 Full Code [id number]  Ivor Costa, MD Inpatient    Advance Directive Documentation     Most Recent Value  Type of Advance Directive  Healthcare Power of Attorney, Living will  Pre-existing out of facility DNR order (yellow form or pink MOST form)  -  "MOST" Form in Place?  -      Home/SNF/Other Home  Chief Complaint [phone]  Level of Care/Admitting Diagnosis ED Disposition    ED Disposition Condition [place]: [facility] [[id number]]  Level of Care: Telemetry [5]  Admit to tele based on following criteria: Complex arrhythmia (Bradycardia/Tachycardia)  Diagnosis: Acute respiratory failure ([place]) [518.81.ICD-9-CM]  Admitting Physician: Patrecia Pour, EDWIN [[id number]]  Attending Physician: Patrecia Pour, EDWIN [phone]  Estimated length of stay: past midnight tomorrow  Certification:: I certify this patient will need inpatient services for at least 2 midnights  PT Class (Do Not Modify): Inpatient [101]  PT Acc Code (Do  Not Modify): Private [1]       Medical History Past Medical History:  Diagnosis Date  . Arthritis    "right knee; left hip" (04/07/2017)  . Asthma   . CHF (congestive heart failure) ([place])    reported treatment for fluid overload in the setting of asthma exacerbation ~ 1998  . COPD (chronic obstructive pulmonary disease) ([place])   . 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 Dyspnea   . Dysrhythmia    occasional skipped beats  . GERD (gastroesophageal reflux disease)   . On home oxygen therapy    "I sleep with 2L" (04/07/2017)  . Pneumonia    "several times" (04/07/2017)  . Seizures ([place]) 2003 X 1   unknown cause    Allergies Allergies  Allergen Reactions  . Contrast Media [Iodinated Diagnostic Agents] Anaphylaxis and Other (See Comments)    ** CARDIAC ARREST **  . Peanuts [Peanut Oil] Shortness Of Breath and Swelling  . Shellfish Allergy Shortness Of Breath and Swelling  . Soap Shortness Of Breath and Swelling    LAUNDRY DETERGENT  . Latex Hives  . Eggs Or Egg-Derived Products Rash    IV Location/Drains/Wounds Patient Lines/Drains/Airways Status   Active Line/Drains/Airways    None          Labs/Imaging Results for orders placed or performed during the hospital encounter of 11/02/17 (from the past 48 hour(s))  Basic metabolic panel     Status:  None   Collection Time: 11/02/17  9:54 PM  Result Value Ref Range   Sodium 144 135 - 145 mmol/L   Potassium 4.3 3.5 - 5.1 mmol/L   Chloride 105 98 - 111 mmol/L    Comment: Please note change in reference range.   CO2 30 22 - 32 mmol/L   Glucose, Bld 99 70 - 99 mg/dL    Comment: Please note change in reference range.   BUN 9 8 - 23 mg/dL    Comment: Please note change in reference range.   Creatinine, Ser 0.73 0.44 - 1.00 mg/dL   Calcium 10.1 8.9 - 10.3 mg/dL   GFR calc non Af Amer >60 >60 mL/min   GFR calc Af Amer >60 >60 mL/min    Comment: (NOTE) The eGFR has been calculated using the CKD EPI  equation. This calculation has not been validated in all clinical situations. eGFR's persistently <60 mL/min signify possible Chronic Kidney Disease.    Anion gap 9 5 - 15    Comment: Performed at [facility], [place] [address], [place], [place] [address]  CBC     Status: Abnormal   Collection Time: 11/02/17  9:54 PM  Result Value Ref Range   WBC 9.7 4.0 - 10.5 K/uL   RBC 4.50 3.87 - 5.11 MIL/uL   Hemoglobin 12.7 12.0 - 15.0 g/dL   HCT 42.1 36.0 - 46.0 %   MCV 93.6 78.0 - 100.0 fL   MCH 28.2 26.0 - 34.0 pg   MCHC 30.2 30.0 - 36.0 g/dL   RDW 15.6 (H) 11.5 - 15.5 %   Platelets 284 150 - 400 K/uL    Comment: Performed at [facility], [place] [address] [place], [place] [address]  Brain natriuretic peptide     Status: None   Collection Time: 11/02/17  9:54 PM  Result Value Ref Range   B Natriuretic Peptide 24.6 0.0 - 100.0 pg/mL    Comment: Performed at [facility], [place] [address], [place], [place] [address]  I-stat troponin, ED     Status: None   Collection Time: 11/02/17 10:06 PM  Result Value Ref Range   Troponin i, poc 0.00 0.00 - 0.08 ng/mL   Comment 3            Comment: Due to the release kinetics of cTnI, a negative result within the first hours of the onset of symptoms does not rule out myocardial infarction with certainty. If myocardial infarction is still suspected, repeat the test at appropriate intervals.   Blood gas, arterial     Status: Abnormal (Preliminary result)   Collection Time: 11/03/17 12:50 AM  Result Value Ref Range   FIO2 30.00    O2 Content PENDING L/min   Delivery systems BILEVEL POSITIVE AIRWAY PRESSURE    LHR 8 resp/min   Inspiratory PAP 12    Expiratory PAP 6    pH, Arterial 7.287 (L) 7.350 - 7.450   pCO2 arterial 65.4 (HH) 32.0 - 48.0 mmHg    Comment: CRITICAL RESULT CALLED TO, READ BACK BY AND VERIFIED WITH: RBV SARA, RN AT 0108 ON 11/03/2017 BY Zoya Sprecher JARRELL, RRT    pO2, Arterial 86.6  83.0 - 108.0 mmHg   Bicarbonate 30.2 (H) 20.0 - 28.0 mmol/L   Acid-Base Excess 2.0 0.0 - 2.0 mmol/L   O2 Saturation 95.1 %   Patient temperature 98.6    Collection site RIGHT RADIAL    Drawn by [phone]  Sample type ARTERIAL DRAW    Allens test (pass/fail) PASS PASS    Comment: Performed at [facility], [place] [address], [place], [place] [address]  CBC     Status: Abnormal   Collection Time: 11/03/17  2:33 AM  Result Value Ref Range   WBC 9.2 4.0 - 10.5 K/uL   RBC 4.46 3.87 - 5.11 MIL/uL   Hemoglobin 12.7 12.0 - 15.0 g/dL   HCT 41.8 36.0 - 46.0 %   MCV 93.7 78.0 - 100.0 fL   MCH 28.5 26.0 - 34.0 pg   MCHC 30.4 30.0 - 36.0 g/dL   RDW 15.6 (H) 11.5 - 15.5 %   Platelets 263 150 - 400 K/uL    Comment: Performed at [facility], [place] [address] [place], [place] [address]  Creatinine, serum     Status: None   Collection Time: 11/03/17  2:33 AM  Result Value Ref Range   Creatinine, Ser 0.73 0.44 - 1.00 mg/dL   GFR calc non Af Amer >60 >60 mL/min   GFR calc Af Amer >60 >60 mL/min    Comment: (NOTE) The eGFR has been calculated using the CKD EPI equation. This calculation has not been validated in all clinical situations. eGFR's persistently <60 mL/min signify possible Chronic Kidney Disease. Performed at [facility], [place] [address], [place], [place] [address]   Blood gas, arterial     Status: Abnormal (Preliminary result)   Collection Time: 11/03/17  3:55 AM  Result Value Ref Range   FIO2 30.00    O2 Content PENDING L/min   Delivery systems BILEVEL POSITIVE AIRWAY PRESSURE    LHR 6 resp/min   Inspiratory PAP 12    Expiratory PAP 6    pH, Arterial 7.331 (L) 7.350 - 7.450   pCO2 arterial 55.6 (H) 32.0 - 48.0 mmHg   pO2, Arterial 108 83.0 - 108.0 mmHg   Bicarbonate 28.6 (H) 20.0 - 28.0 mmol/L   Acid-Base Excess 2.1 (H) 0.0 - 2.0 mmol/L   O2 Saturation 97.7 %   Patient temperature 98.6    Collection site RIGHT RADIAL     Drawn by [phone]    Sample type ARTERIAL DRAW    Allens test (pass/fail) PASS PASS    Comment: Performed at [facility], [place] [address] [place], [place] [address]  Basic metabolic panel     Status: Abnormal   Collection Time: 11/03/17  5:20 AM  Result Value Ref Range   Sodium 147 (H) 135 - 145 mmol/L   Potassium 4.9 3.5 - 5.1 mmol/L   Chloride 109 98 - 111 mmol/L    Comment: Please note change in reference range.   CO2 30 22 - 32 mmol/L   Glucose, Bld 180 (H) 70 - 99 mg/dL    Comment: Please note change in reference range.   BUN 8 8 - 23 mg/dL    Comment: Please note change in reference range.   Creatinine, Ser 0.59 0.44 - 1.00 mg/dL   Calcium 10.0 8.9 - 10.3 mg/dL   GFR calc non Af Amer >60 >60 mL/min   GFR calc Af Amer >60 >60 mL/min    Comment: (NOTE) The eGFR has been calculated using the CKD EPI equation. This calculation has not been validated in all clinical situations. eGFR's persistently <60 mL/min signify possible Chronic Kidney Disease.    Anion gap 8 5 - 15    Comment: Performed at [facility], [place]  [place] [place] [employer], [place] [address]  CBC     Status: Abnormal   Collection Time: 11/03/17  5:20 AM  Result Value Ref Range   WBC 7.7 4.0 - 10.5 K/uL   RBC 4.41 3.87 - 5.11 MIL/uL   Hemoglobin 12.3 12.0 - 15.0 g/dL   HCT 41.2 36.0 - 46.0 %   MCV 93.4 78.0 - 100.0 fL   MCH 27.9 26.0 - 34.0 pg   MCHC 29.9 (L) 30.0 - 36.0 g/dL   RDW 15.4 11.5 - 15.5 %   Platelets 264 150 - 400 K/uL    Comment: Performed at [facility], [place] [address], [place], [place] [address]  Magnesium     Status: None   Collection Time: 11/03/17  5:20 AM  Result Value Ref Range   Magnesium 2.2 1.7 - 2.4 mg/dL    Comment: Performed at [facility], [place] [address] [place], [place] [address]  Hepatic function panel     Status: None   Collection Time: 11/03/17  5:20 AM  Result Value Ref Range   Total Protein 7.3 6.5 -  8.1 g/dL   Albumin 3.6 3.5 - 5.0 g/dL   AST 22 15 - 41 U/L   ALT 17 0 - 44 U/L    Comment: Please note change in reference range.   Alkaline Phosphatase 83 38 - 126 U/L   Total Bilirubin 0.4 0.3 - 1.2 mg/dL   Bilirubin, Direct 0.1 0.0 - 0.2 mg/dL    Comment: Please note change in reference range.   Indirect Bilirubin 0.3 0.3 - 0.9 mg/dL    Comment: Performed at [facility], [place] [address] [place], [place] [address]  Troponin I (q 6hr x 3)     Status: None   Collection Time: 11/03/17  5:20 AM  Result Value Ref Range   Troponin I <0.03 <0.03 ng/mL    Comment: Performed at [facility], [place] [address], [place], [place] [address]  Procalcitonin - Baseline     Status: None   Collection Time: 11/03/17  5:20 AM  Result Value Ref Range   Procalcitonin <0.10 ng/mL    Comment:        Interpretation: PCT (Procalcitonin) <= 0.5 ng/mL: Systemic infection (sepsis) is not likely. Local bacterial infection is possible. (NOTE)       Sepsis PCT Algorithm           Lower Respiratory Tract                                      Infection PCT Algorithm    ----------------------------     ----------------------------         PCT < 0.25 ng/mL                PCT < 0.10 ng/mL         Strongly encourage             Strongly discourage   discontinuation of antibiotics    initiation of antibiotics    ----------------------------     -----------------------------       PCT 0.25 - 0.50 ng/mL            PCT 0.10 - 0.25 ng/mL               OR       >80% decrease in PCT  Discourage initiation of                                            antibiotics      Encourage discontinuation           of antibiotics    ----------------------------     -----------------------------         PCT >= 0.50 ng/mL              PCT 0.26 - 0.50 ng/mL               AND        <80% decrease in PCT             Encourage initiation of                                              antibiotics       Encourage continuation           of antibiotics    ----------------------------     -----------------------------        PCT >= 0.50 ng/mL                  PCT > 0.50 ng/mL               AND         increase in PCT                  Strongly encourage                                      initiation of antibiotics    Strongly encourage escalation           of antibiotics                                     -----------------------------                                           PCT <= 0.25 ng/mL                                                 OR                                        > 80% decrease in PCT                                     Discontinue / Do not initiate  antibiotics Performed at [facility], [place] [address], [place], [place] [address]   CBG monitoring, ED     Status: Abnormal   Collection Time: 11/03/17  6:03 AM  Result Value Ref Range   Glucose-Capillary 144 (H) 70 - 99 mg/dL   Dg Chest 2 View  Result Date: 11/02/2017 CLINICAL DATA:  Sob, Hx of asthma, Nurse stated pt unstable to bring to xray. pcxr done EXAM: CHEST - 2 VIEW COMPARISON:  10/14/2017 FINDINGS: Patient rotated minimally right. Midline trachea. Normal heart size and mediastinal contours. No pleural effusion or pneumothorax. Clear lungs. IMPRESSION: No acute cardiopulmonary disease. Electronically Signed   By: Abigail Miyamoto M.D.   On: 11/02/2017 22:33    Pending Labs Unresulted Labs (From admission, onward)   Start     Ordered   11/10/17 0500  Creatinine, serum  (enoxaparin (LOVENOX)    CrCl >/= 30 ml/min)  Weekly,   R    Comments:  while on enoxaparin therapy    11/03/17 0218   11/03/17 0629  Troponin I (q 6hr x 3)  Now then every 6 hours,   R     11/03/17 0628      Vitals/Pain Today's Vitals   11/03/17 0707 11/03/17 0827 11/03/17 0845 11/03/17 0857  BP: 106/67  109/75   Pulse: 81  87   Resp: 20  20   SpO2: 100%  100% 94% 96%    Isolation Precautions No active isolations  Medications Medications  acetaminophen (TYLENOL) tablet 650 mg (has no administration in time range)    Or  acetaminophen (TYLENOL) suppository 650 mg (has no administration in time range)  ondansetron (ZOFRAN) tablet 4 mg (has no administration in time range)    Or  ondansetron (ZOFRAN) injection 4 mg (has no administration in time range)  enoxaparin (LOVENOX) injection 40 mg (has no administration in time range)  albuterol (PROVENTIL) (2.5 MG/3ML) 0.083% nebulizer solution 2.5 mg (has no administration in time range)  ipratropium-albuterol (DUONEB) 0.5-2.5 (3) MG/3ML nebulizer solution 3 mL (3 mLs Nebulization Given 11/03/17 0816)  levofloxacin (LEVAQUIN) IVPB 750 mg (0 mg Intravenous Stopped 11/03/17 0434)  budesonide (PULMICORT) nebulizer solution 0.5 mg (0.5 mg Nebulization Given 11/03/17 0823)  montelukast (SINGULAIR) tablet 10 mg (has no administration in time range)  arformoterol (BROVANA) nebulizer solution 15 mcg (15 mcg Nebulization Not Given 11/03/17 0824)  methylPREDNISolone sodium succinate (SOLU-MEDROL) 125 mg/2 mL injection 60 mg (has no administration in time range)  albuterol (PROVENTIL) (2.5 MG/3ML) 0.083% nebulizer solution 5 mg (5 mg Nebulization Given 11/02/17 2235)  methylPREDNISolone sodium succinate (SOLU-MEDROL) 125 mg/2 mL injection 125 mg (125 mg Intravenous Given 11/02/17 2158)  ketorolac (TORADOL) 30 MG/ML injection 15 mg (15 mg Intravenous Given 11/02/17 2223)  LORazepam (ATIVAN) injection 0.5 mg (0.5 mg Intravenous Given 11/02/17 2223)  magnesium sulfate IVPB 2 g 50 mL (0 g Intravenous Stopped 11/02/17 2238)    Mobility Non ambulatory, usually walks

## 2017-11-03 NOTE — ED Notes (Signed)
Pt c/o difficulty breathing- neb tx started

## 2017-11-03 NOTE — H&P (Signed)
History and Physical    Sonia NOREN Drake:[id number] DOB: 05-06-1955 DOA: 11/02/2017  PCP: Verlon Au, MD  Patient coming from: Home  Chief Complaint: Shortness of breath.  HPI: Sonia Drake is a 62 y.o. female with history of asthma/COPD, diastolic dysfunction, seizures per the chart presents to the ER with complaints of worsening shortness of breath over the last 24 hours.  Patient has been having wheezing productive cough discolored sputum and some chest pressure on coughing over the last 24 hours.  Denies any nausea vomiting abdominal pain or diarrhea.  ED Course: The ER patient has been acutely short of breath and was placed on BiPAP.  Was given IV Solu-Medrol nebulizer treatment.  Chest x-ray does not show anything acute.  ABG shows pH of 7.28 with PCO2 of 65.  On exam patient has mild difficulty in completing sentences.  And is mostly communicating by writing on a book.  Patient is being admitted for acute respiratory failure likely from\extubation.  Review of Systems: As per HPI, rest all negative.   Past Medical History:  Diagnosis Date  . Arthritis    "right knee; left hip" (04/07/2017)  . Asthma   . CHF (congestive heart failure) (HCC)    reported treatment for fluid overload in the setting of asthma exacerbation ~ 1998  . COPD (chronic obstructive pulmonary disease) (HCC)   . 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 Dyspnea   . Dysrhythmia    occasional skipped beats  . GERD (gastroesophageal reflux disease)   . On home oxygen therapy    "I sleep with 2L" (04/07/2017)  . Pneumonia    "several times" (04/07/2017)  . Seizures (HCC) 2003 X 1   unknown cause    Past Surgical History:  Procedure Laterality Date  . JOINT REPLACEMENT    . KNEE ARTHROSCOPY Right 2017  . 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 TOTAL HIP ARTHROPLASTY Left 04/07/2017   Procedure: LEFT TOTAL HIP ARTHROPLASTY ANTERIOR APPROACH;  Surgeon: Gean Birchwood, MD;   Location: [facility] OR;  Service: Orthopedics;  Laterality: Left;     reports that she has never smoked. She has never used smokeless tobacco. She reports that she does not drink alcohol or use drugs.  Allergies  Allergen Reactions  . Contrast Media [Iodinated Diagnostic Agents] Anaphylaxis and Other (See Comments)    ** CARDIAC ARREST **  . Peanuts [Peanut Oil] Shortness Of Breath and Swelling  . Shellfish Allergy Shortness Of Breath and Swelling  . Soap Shortness Of Breath and Swelling    LAUNDRY DETERGENT  . Latex Hives  . Eggs Or Egg-Derived Products Rash    Family History  Problem Relation Age of Onset  . Heart disease Mother   . Heart disease Father   . Diabetes Father   . Asthma Father   . Diabetes Brother   . Diabetes Sister   . Asthma Brother   . Asthma Sister   . Hypertension Sister     Prior to Admission medications   Medication Sig Start Date End Date Taking? Authorizing Provider  albuterol (PROVENTIL HFA;VENTOLIN HFA) 108 (90 Base) MCG/ACT inhaler Inhale 1-2 puffs into the lungs every 6 (six) hours as needed for wheezing or shortness of breath. Patient taking differently: Inhale 2 puffs into the lungs every 6 (six) hours as needed for wheezing or shortness of breath.  02/18/17   Dorothea Ogle, MD  albuterol (PROVENTIL) (2.5 MG/3ML) 0.083% nebulizer solution Take 2.5 mg by  nebulization every 6 (six) hours as needed for wheezing or shortness of breath.    [provider]  azithromycin (ZITHROMAX) 250 MG tablet Take 1 tablet (250 mg total) by mouth daily. Take first 2 tablets together, then 1 every day until finished. 10/15/17   Roxy HorsemanBrowning, Robert, PA-C  budesonide-formoterol (SYMBICORT) 160-4.5 MCG/ACT inhaler Inhale 2 puffs into the lungs 2 (two) times daily.    [provider]  CALCIUM PO Take 1 tablet by mouth daily.    [provider]  Cholecalciferol (VITAMIN D3 PO) Take 1 tablet by mouth daily.    [provider]  montelukast  (SINGULAIR) 10 MG tablet Take 10 mg by mouth daily.     [provider]  pantoprazole (PROTONIX) 20 MG tablet Take 20 mg by mouth daily as needed for indigestion.    [provider]  predniSONE (DELTASONE) 20 MG tablet Take 2 tablets (40 mg total) by mouth daily. 10/15/17   Roxy HorsemanBrowning, Robert, PA-C  tiotropium (SPIRIVA) 18 MCG inhalation capsule Place 18 mcg into inhaler and inhale daily.     [provider]  triamcinolone lotion (KENALOG) 0.1 % Apply 1 application topically 2 (two) times daily as needed (for rash).  08/29/15   [provider]    Physical Exam: Vitals:   11/03/17 0115 11/03/17 0134 11/03/17 0145 11/03/17 0200  BP: 138/82  128/83 123/78  Pulse: 85  88 81  Resp: (!) 21  (!) 24 18  SpO2: 98% 100% 100% 100%      Constitutional: Moderately built and nourished. Vitals:   11/03/17 0115 11/03/17 0134 11/03/17 0145 11/03/17 0200  BP: 138/82  128/83 123/78  Pulse: 85  88 81  Resp: (!) 21  (!) 24 18  SpO2: 98% 100% 100% 100%   Eyes: Anicteric no pallor. ENMT: No discharge from the ears eyes nose or mouth. Neck: No JVD appreciated no mass felt. Respiratory: Bilateral expiratory wheeze decreased air exchange no crepitations. Cardiovascular: S1-S2 heard no murmurs appreciated. Abdomen: Soft nontender bowel sounds present. Musculoskeletal: No edema. Skin: No rash. Neurologic: Alert awake oriented to time place and person.  Moves all extremity's. Psychiatric: Appears normal per normal affect.   Labs on Admission: I have personally reviewed following labs and imaging studies  CBC: Recent Labs  Lab 11/02/17 2154  WBC 9.7  HGB 12.7  HCT 42.1  MCV 93.6  PLT 284   Basic Metabolic Panel: Recent Labs  Lab 11/02/17 2154  NA 144  K 4.3  CL 105  CO2 30  GLUCOSE 99  BUN 9  CREATININE 0.73  CALCIUM 10.1   GFR: CrCl cannot be calculated (Unknown ideal weight.). Liver Function Tests: No results for input(s): AST, ALT, ALKPHOS,  BILITOT, PROT, ALBUMIN in the last 168 hours. No results for input(s): LIPASE, AMYLASE in the last 168 hours. No results for input(s): AMMONIA in the last 168 hours. Coagulation Profile: No results for input(s): INR, PROTIME in the last 168 hours. Cardiac Enzymes: No results for input(s): CKTOTAL, CKMB, CKMBINDEX, TROPONINI in the last 168 hours. BNP (last 3 results) No results for input(s): PROBNP in the last 8760 hours. HbA1C: No results for input(s): HGBA1C in the last 72 hours. CBG: No results for input(s): GLUCAP in the last 168 hours. Lipid Profile: No results for input(s): CHOL, HDL, LDLCALC, TRIG, CHOLHDL, LDLDIRECT in the last 72 hours. Thyroid Function Tests: No results for input(s): TSH, T4TOTAL, FREET4, T3FREE, THYROIDAB in the last 72 hours. Anemia Panel: No results for input(s): VITAMINB12,  FOLATE, FERRITIN, TIBC, IRON, RETICCTPCT in the last 72 hours. Urine analysis:    Component Value Date/Time   COLORURINE YELLOW 03/31/2017 1500   APPEARANCEUR CLEAR 03/31/2017 1500   LABSPEC 1.016 03/31/2017 1500   PHURINE 7.0 03/31/2017 1500   GLUCOSEU NEGATIVE 03/31/2017 1500   HGBUR NEGATIVE 03/31/2017 1500   BILIRUBINUR NEGATIVE 03/31/2017 1500   KETONESUR NEGATIVE 03/31/2017 1500   PROTEINUR NEGATIVE 03/31/2017 1500   UROBILINOGEN 0.2 07/03/2014 1456   NITRITE NEGATIVE 03/31/2017 1500   LEUKOCYTESUR NEGATIVE 03/31/2017 1500   Sepsis Labs: @LABRCNTIP (procalcitonin:4,lacticidven:4) )No results found for this or any previous visit (from the past 240 hour(s)).   Radiological Exams on Admission: Dg Chest 2 View  Result Date: 11/02/2017 CLINICAL DATA:  Sob, Hx of asthma, Nurse stated pt unstable to bring to xray. pcxr done EXAM: CHEST - 2 VIEW COMPARISON:  10/14/2017 FINDINGS: Patient rotated minimally right. Midline trachea. Normal heart size and mediastinal contours. No pleural effusion or pneumothorax. Clear lungs. IMPRESSION: No acute cardiopulmonary disease.  Electronically Signed   By: Jeronimo Greaves M.D.   On: 11/02/2017 22:33    EKG: Independently reviewed.  Normal sinus rhythm.  Assessment/Plan Principal Problem:   Acute respiratory failure with hypoxia (HCC) Active Problems:   Chronic obstructive pulmonary disease with acute exacerbation (HCC)   Asthma exacerbation   Chronic diastolic CHF (congestive heart failure) (HCC)    1. Acute respiratory failure with hypoxia and hypercarbia likely from asthma exacerbation.  Patient is placed on BiPAP for now.  I have discussed with pulmonary critical care.  We will continue with IV Solu-Medrol nebulizer treatment Pulmicort and will keep patient on Levaquin since patient has productive sputum. 2. History of diastolic dysfunction last EF measured in March 2016 was 60 to 65% with grade 2 diastolic dysfunction.  Appears compensated.   3. Chest pain appears to be reproducible.  Will check cardiac markers.   DVT prophylaxis: Lovenox. Code Status: Full code. Family Communication: Discussed with patient. Disposition Plan: Home. Consults called: PCCM. Admission status: Inpatient.   Eduard Clos MD [facility] Pager [phone].  If 7PM-7AM, please contact night-coverage [email or web address] Password [email or web address]  11/03/2017, 2:19 AM

## 2017-11-04 DIAGNOSIS — I5032 Chronic diastolic (congestive) heart failure: Secondary | ICD-10-CM

## 2017-11-04 DIAGNOSIS — J9621 Acute and chronic respiratory failure with hypoxia: Secondary | ICD-10-CM

## 2017-11-04 DIAGNOSIS — J9622 Acute and chronic respiratory failure with hypercapnia: Secondary | ICD-10-CM

## 2017-11-04 LAB — BASIC METABOLIC PANEL
Anion gap: 8 (ref 5–15)
BUN: 13 mg/dL (ref 8–23)
CO2: 29 mmol/L (ref 22–32)
Calcium: 10.9 mg/dL — ABNORMAL HIGH (ref 8.9–10.3)
Chloride: 104 mmol/L (ref 98–111)
Creatinine, Ser: 0.6 mg/dL (ref 0.44–1.00)
GFR calc Af Amer: >60 mL/min (ref >60)
GFR calc non Af Amer: >60 mL/min (ref >60)
Glucose, Bld: 170 mg/dL — ABNORMAL HIGH (ref 70–99)
Potassium: 4.7 mmol/L (ref 3.5–5.1)
Sodium: 141 mmol/L (ref 135–145)

## 2017-11-04 LAB — CBC WITH DIFFERENTIAL/PLATELET
Basophils Absolute: 0 10*3/uL (ref 0.0–0.1)
Basophils Relative: 0 %
Eosinophils Absolute: 0 10*3/uL (ref 0.0–0.7)
Eosinophils Relative: 0 %
HCT: 42 % (ref 36.0–46.0)
Hemoglobin: 12.7 g/dL (ref 12.0–15.0)
Lymphocytes Relative: 11 %
Lymphs Abs: 1.5 10*3/uL (ref 0.7–4.0)
MCH: 28.1 pg (ref 26.0–34.0)
MCHC: 30.2 g/dL (ref 30.0–36.0)
MCV: 92.9 fL (ref 78.0–100.0)
Monocytes Absolute: 0.5 10*3/uL (ref 0.1–1.0)
Monocytes Relative: 3 %
Neutro Abs: 12.6 10*3/uL — ABNORMAL HIGH (ref 1.7–7.7)
Neutrophils Relative %: 86 %
Platelets: 287 10*3/uL (ref 150–400)
RBC: 4.52 MIL/uL (ref 3.87–5.11)
RDW: 15.5 % (ref 11.5–15.5)
WBC: 14.7 10*3/uL — ABNORMAL HIGH (ref 4.0–10.5)

## 2017-11-04 LAB — GLUCOSE, CAPILLARY
Glucose-Capillary: 175 mg/dL — ABNORMAL HIGH (ref 70–99)
Glucose-Capillary: 146 mg/dL — ABNORMAL HIGH (ref 70–99)

## 2017-11-04 MED ORDER — PREDNISONE 20 MG PO TABS: 40.0000 mg | ORAL_TABLET | Freq: Every day | ORAL | Status: DC

## 2017-11-04 MED ORDER — PREDNISONE 10 MG PO TABS: ORAL_TABLET | ORAL | 0 refills | Status: DC

## 2017-11-04 MED ORDER — PANTOPRAZOLE SODIUM 40 MG PO TBEC: 40.0000 mg | DELAYED_RELEASE_TABLET | Freq: Every day | ORAL | Status: DC

## 2017-11-04 MED ORDER — IPRATROPIUM-ALBUTEROL 0.5-2.5 (3) MG/3ML IN SOLN
3.0000 mL | Freq: Four times a day (QID) | RESPIRATORY_TRACT | Status: DC
  Administered 2017-11-04: 3 mL via RESPIRATORY_TRACT
  Filled 2017-11-04: qty 3

## 2017-11-04 NOTE — Progress Notes (Addendum)
PULMONARY / CRITICAL CARE MEDICINE   Name: Sonia Drake MRN: [id number] DOB: 11-10-1955    ADMISSION DATE:  11/02/2017 CONSULTATION DATE: 7/15  REFERRING MD:  Leavy Cella   CHIEF COMPLAINT:  Wheezing and dyspnea  HISTORY OF PRESENT ILLNESS:   62 year old female patient with a history of COPD, asthma, GERD, chronic respiratory failure on 2 L of oxygen at baseline.  Also diastolic heart failure with preserved ejection fraction.  Presented to the emergency room on 7/14 with chief complaint of worsening shortness of breath and resting dyspnea.  She was in tripod position with accessory muscle use on presentation complaining of anterior chest pain, cough, wheezing.  Placed on noninvasive positive pressure ventilation, administered bronchodilators, systemic steroids, and empiric antibiotic . -Remotely followed by pulmonary  -Pulmonary asked to evaluate and assist with therapy  SUBJECTIVE/events overnight:  -Feeling a little better.  -still w/ cough   VITAL SIGNS: Blood Pressure 108/63   Pulse 87   Temperature 97.9 F (36.6 C) (Oral)   Respiration 18   Weight 209 lb 14.1 oz (95.2 kg)   Oxygen Saturation 96%   Body Mass Index 37.18 kg/m   HEMODYNAMICS:    VENTILATOR SETTINGS:    INTAKE / OUTPUT: I/O last 3 completed shifts: In: 973.3 [P.O.:720; IV Piggyback:253.3] Out: -   PHYSICAL EXAMINATION: General:  Awake, alert no distress  Neuro:  Awake, no focal def.  HEENT:  NCAT. No JVD MMM. + UAW wheezing Cardiovascular: RRR Lungs:  Diffuse wheeze  Abdomen:  Soft not tender  Musculoskeletal:  Equal st and bulk Skin:  Warm and dry   LABS:  BMET Recent Labs  Lab 11/02/17 2154 11/03/17 0233 11/03/17 0520 11/04/17 0537  NA 144  --  147* 141  K 4.3  --  4.9 4.7  CL 105  --  109 104  CO2 30  --  30 29  BUN 9  --  8 13  CREATININE 0.73 0.73 0.59 0.60  GLUCOSE 99  --  180* 170*    Electrolytes Recent Labs  Lab 11/02/17 2154 11/03/17 0520 11/04/17 0537  CALCIUM 10.1  10.0 10.9*  MG  --  2.2  --     CBC Recent Labs  Lab 11/03/17 0233 11/03/17 0520 11/04/17 0537  WBC 9.2 7.7 14.7*  HGB 12.7 12.3 12.7  HCT 41.8 41.2 42.0  PLT 263 264 287    Coag's No results for input(s): APTT, INR in the last 168 hours.  Sepsis Markers Recent Labs  Lab 11/03/17 0520  PROCALCITON <0.10    ABG Recent Labs  Lab 11/03/17 0050 11/03/17 0355  PHART 7.287* 7.331*  PCO2ART 65.4* 55.6*  PO2ART 86.6 108    Liver Enzymes Recent Labs  Lab 11/03/17 0520  AST 22  ALT 17  ALKPHOS 83  BILITOT 0.4  ALBUMIN 3.6    Cardiac Enzymes Recent Labs  Lab 11/03/17 0520 11/03/17 1335  TROPONINI <0.03 <0.03    Glucose Recent Labs  Lab 11/03/17 0603 11/03/17 1157 11/03/17 1723 11/03/17 2316 11/04/17 0632  GLUCAP 144* 115* 140* 177* 175*    Imaging No results found.   ASSESSMENT / PLAN: Acute on chronic hypoxic and hypercarbic respiratory failure in the setting of asthmatic exacerbation versus acute exacerbation chronic strict pulmonary disease -feels better -there is clearly an upper airway component  Plan 5d abx for possible bronchitis Taper steroids Cont BDs Home on singulair, spiriva and symbicort Cont PPI  We will see her in office (will place appointment in computer)-->she wants  to re-establish w/ our practice)  Heart failure with preserved ejection fraction Plan Cont home meds  History of seizures and depression Plan Continue home medications 11/04/2017, 9:50 AM  Simonne MartinetPeter E Ramie Erman ACNP-BC [facility] Pager # [phone] OR # [phone] if no answer

## 2017-11-04 NOTE — Plan of Care (Signed)
Problem: Education: Goal: Knowledge of General Education information will improve 11/04/2017 1937 by Cordie Grice, RN Outcome: Adequate for Discharge 11/04/2017 1429 by Cordie Grice, RN Outcome: Adequate for Discharge   Problem: Health Behavior/Discharge Planning: Goal: Ability to manage health-related needs will improve 11/04/2017 1937 by Cordie Grice, RN Outcome: Adequate for Discharge 11/04/2017 1429 by Cordie Grice, RN Outcome: Adequate for Discharge   Problem: Clinical Measurements: Goal: Ability to maintain clinical measurements within normal limits will improve 11/04/2017 1937 by Cordie Grice, RN Outcome: Adequate for Discharge 11/04/2017 1429 by Cordie Grice, RN Outcome: Adequate for Discharge Goal: Will remain free from infection 11/04/2017 1937 by Cordie Grice, RN Outcome: Adequate for Discharge 11/04/2017 1429 by Cordie Grice, RN Outcome: Adequate for Discharge Goal: Diagnostic test results will improve 11/04/2017 1937 by Cordie Grice, RN Outcome: Adequate for Discharge 11/04/2017 1429 by Cordie Grice, RN Outcome: Adequate for Discharge Goal: Respiratory complications will improve 11/04/2017 1937 by Cordie Grice, RN Outcome: Adequate for Discharge 11/04/2017 1429 by Cordie Grice, RN Outcome: Adequate for Discharge Goal: Cardiovascular complication will be avoided 11/04/2017 1937 by Cordie Grice, RN Outcome: Adequate for Discharge 11/04/2017 1429 by Cordie Grice, RN Outcome: Adequate for Discharge   Problem: Activity: Goal: Risk for activity intolerance will decrease 11/04/2017 1937 by Cordie Grice, RN Outcome: Adequate for Discharge 11/04/2017 1429 by Cordie Grice, RN Outcome: Adequate for Discharge   Problem: Nutrition: Goal: Adequate nutrition will be maintained 11/04/2017 1937 by Cordie Grice, RN Outcome: Adequate for Discharge 11/04/2017 1429 by Cordie Grice,  RN Outcome: Adequate for Discharge   Problem: Coping: Goal: Level of anxiety will decrease 11/04/2017 1937 by Cordie Grice, RN Outcome: Adequate for Discharge 11/04/2017 1429 by Cordie Grice, RN Outcome: Adequate for Discharge   Problem: Elimination: Goal: Will not experience complications related to bowel motility 11/04/2017 1937 by Cordie Grice, RN Outcome: Adequate for Discharge 11/04/2017 1429 by Cordie Grice, RN Outcome: Adequate for Discharge Goal: Will not experience complications related to urinary retention 11/04/2017 1937 by Cordie Grice, RN Outcome: Adequate for Discharge 11/04/2017 1429 by Cordie Grice, RN Outcome: Adequate for Discharge   Problem: Pain Managment: Goal: General experience of comfort will improve 11/04/2017 1937 by Cordie Grice, RN Outcome: Adequate for Discharge 11/04/2017 1429 by Cordie Grice, RN Outcome: Adequate for Discharge   Problem: Safety: Goal: Ability to remain free from injury will improve 11/04/2017 1937 by Cordie Grice, RN Outcome: Adequate for Discharge 11/04/2017 1429 by Cordie Grice, RN Outcome: Adequate for Discharge   Problem: Skin Integrity: Goal: Risk for impaired skin integrity will decrease 11/04/2017 1937 by Cordie Grice, RN Outcome: Adequate for Discharge 11/04/2017 1429 by Cordie Grice, RN Outcome: Adequate for Discharge   Problem: Activity: Goal: Ability to perform activities at highest level will improve 11/04/2017 1937 by Cordie Grice, RN Outcome: Adequate for Discharge 11/04/2017 1429 by Cordie Grice, RN Outcome: Adequate for Discharge   Problem: Education: Goal: Verbalization of understanding the information provided will improve 11/04/2017 1937 by Cordie Grice, RN Outcome: Adequate for Discharge 11/04/2017 1429 by Cordie Grice, RN Outcome: Adequate for Discharge Goal: Identification of ways to alter the environment to  positively affect health status will improve 11/04/2017 1937 by Cordie Grice, RN Outcome: Adequate for Discharge 11/04/2017 1429 by Cordie Grice, RN Outcome: Adequate for Discharge   Problem: Coping: Goal: Level  of anxiety will decrease 11/04/2017 1937 by Cordie Griceodriguez, Fleeta Kunde A, RN Outcome: Adequate for Discharge 11/04/2017 1429 by Cordie Griceodriguez, Jheri Mitter A, RN Outcome: Adequate for Discharge   Problem: Respiratory: Goal: Respiratory status will improve 11/04/2017 1937 by Cordie Griceodriguez, Cortasia Screws A, RN Outcome: Adequate for Discharge 11/04/2017 1429 by Cordie Griceodriguez, Shirlene Andaya A, RN Outcome: Adequate for Discharge Goal: Ability to maintain adequate ventilation will improve 11/04/2017 1937 by Cordie Griceodriguez, Bradshaw Minihan A, RN Outcome: Adequate for Discharge 11/04/2017 1429 by Cordie Griceodriguez, Betzaira Mentel A, RN Outcome: Adequate for Discharge Goal: Diagnostic test results will improve 11/04/2017 1937 by Cordie Griceodriguez, Ciaran Begay A, RN Outcome: Adequate for Discharge 11/04/2017 1429 by Cordie Griceodriguez, Terius Jacuinde A, RN Outcome: Adequate for Discharge Goal: Identification of resources available to assist in meeting health care needs will improve 11/04/2017 1937 by Cordie Griceodriguez, Chesley Valls A, RN Outcome: Adequate for Discharge 11/04/2017 1429 by Cordie Griceodriguez, Kreed Kauffman A, RN Outcome: Adequate for Discharge

## 2017-11-04 NOTE — Discharge Summary (Signed)
Physician Discharge Summary  Sonia Drake  ZOX:[id number]  DOB: January 21, 1956  DOA: 11/02/2017 PCP: Verlon Au, MD  Admit date: 11/02/2017 Discharge date: 11/04/2017  Admitted From: Home  Disposition: Home   Recommendations for Outpatient Follow-up:  1. Follow up with PCP in 1-2 weeks 2. Please obtain BMP/CBC in one week to monitor WBC and renal function  3. Follow up with pulmonology in 1-2 weeks   Discharge Condition: Stable  CODE STATUS: Full Code  Diet recommendation: Heart Healthy   Brief/Interim Summary: For full details see H&P/Progress note, but in brief, Sonia Drake is a 62 year old female with medical history of COPD, asthma, GERD and chronic respiratory failure on 2 L of oxygen at baseline who presented to the emergency department on 7/14 with chief complaint of worsening shortness of breath and resting dyspnea.  She was in tripod position with accessory muscle use on presentation and complaining of anterior chest pain, cough and wheezing.  Patient was placed on BiPAP, administer bronchodilator, systemic steroids and empiric antibiotics.  Patient was admitted with working diagnosis of acute respiratory failure with hypoxia due to severe asthma exacerbation.  Subjective: Patient seen and examined, feeling much better.  Still have some cough. No acute events overnight.   Discharge Diagnoses/Hospital Course:  Acute on chronic respiratory failure with hypoxia secondary to asthma exacerbation/COPD Patient was treated initially with BiPAP, IV Solu-Medrol and nebulizer treatment.  Patient was placed on empiric antibiotic as patient had productive sputum.  Procalcitonin was checked and was negative, Solu-Medrol was transitioned to prednisone, Levaquin was discontinued.  Patient clinically improved and was deemed stable for discharge and follow-up as an outpatient.  Asthma/COPD exacerbation See above, will discharge home home inhalers and prednisone taper.  Procalcitonin was  negative so no antibiotics indicated at this time.  Follow-up with PCP.  Patient might benefit from Earlie Server will defer to pulmonology to change as an outpatient.  Chronic diastolic dysfunction Patient euvolemic during hospital stay.  Continue to monitor.  All other chronic medical condition were stable during the hospitalization.  On the day of the discharge the patient's vitals were stable, and no other acute medical condition were reported by patient. the patient was felt safe to be discharge to home.   Discharge Instructions  You were cared for by a hospitalist during your hospital stay. If you have any questions about your discharge medications or the care you received while you were in the hospital after you are discharged, you can call the unit and asked to speak with the hospitalist on call if the hospitalist that took care of you is not available. Once you are discharged, your primary care physician will handle any further medical issues. Please note that NO REFILLS for any discharge medications will be authorized once you are discharged, as it is imperative that you return to your primary care physician (or establish a relationship with a primary care physician if you do not have one) for your aftercare needs so that they can reassess your need for medications and monitor your lab values.  Discharge Instructions    Call MD for:  difficulty breathing, headache or visual disturbances   Complete by:  As directed    Call MD for:  extreme fatigue   Complete by:  As directed    Call MD for:  hives   Complete by:  As directed    Call MD for:  persistant dizziness or light-headedness   Complete by:  As directed    Call  MD for:  persistant nausea and vomiting   Complete by:  As directed    Call MD for:  redness, tenderness, or signs of infection (pain, swelling, redness, odor or green/yellow discharge around incision site)   Complete by:  As directed    Call MD for:  severe uncontrolled  pain   Complete by:  As directed    Call MD for:  temperature >100.4   Complete by:  As directed    Diet - low sodium heart healthy   Complete by:  As directed    Increase activity slowly   Complete by:  As directed      Allergies as of 11/04/2017      Reactions   Contrast Media [iodinated Diagnostic Agents] Anaphylaxis, Other (See Comments)   ** CARDIAC ARREST **   Peanuts [peanut Oil] Shortness Of Breath, Swelling   Shellfish Allergy Shortness Of Breath, Swelling   Soap Shortness Of Breath, Swelling   LAUNDRY DETERGENT   Latex Hives   Eggs Or Egg-derived Products Rash      Medication List    STOP taking these medications   azithromycin 250 MG tablet Commonly known as:  ZITHROMAX     TAKE these medications   albuterol (2.5 MG/3ML) 0.083% nebulizer solution Commonly known as:  PROVENTIL Take 2.5 mg by nebulization every 6 (six) hours as needed for wheezing or shortness of breath.   albuterol 108 (90 Base) MCG/ACT inhaler Commonly known as:  PROVENTIL HFA;VENTOLIN HFA Inhale 1-2 puffs into the lungs every 6 (six) hours as needed for wheezing or shortness of breath.   budesonide-formoterol 160-4.5 MCG/ACT inhaler Commonly known as:  SYMBICORT Inhale 2 puffs into the lungs 2 (two) times daily.   CALCIUM PO Take 1 tablet by mouth daily.   montelukast 10 MG tablet Commonly known as:  SINGULAIR Take 10 mg by mouth daily.   pantoprazole 20 MG tablet Commonly known as:  PROTONIX Take 20 mg by mouth daily as needed for indigestion.   predniSONE 10 MG tablet Commonly known as:  DELTASONE Take 4 tablets for 3 days; Take 3 tablets for 4 days; Take 2 tablets for 3 days; Take 1 tablet for 4 days What changed:    medication strength  how much to take  how to take this  when to take this  additional instructions   tiotropium 18 MCG inhalation capsule Commonly known as:  SPIRIVA Place 18 mcg into inhaler and inhale daily.   triamcinolone lotion 0.1 % Commonly  known as:  KENALOG Apply 1 application topically 2 (two) times daily as needed (for rash).   VITAMIN D3 PO Take 1 tablet by mouth daily.      Follow-up Information    Verlon Au, MD. Schedule an appointment as soon as possible for a visit in 1 week(s).   Specialty:  Family Medicine Why:  Hospital follow-up Contact information: [address] [place] [address] [phone]          Allergies  Allergen Reactions  . Contrast Media [Iodinated Diagnostic Agents] Anaphylaxis and Other (See Comments)    ** CARDIAC ARREST **  . Peanuts [Peanut Oil] Shortness Of Breath and Swelling  . Shellfish Allergy Shortness Of Breath and Swelling  . Soap Shortness Of Breath and Swelling    LAUNDRY DETERGENT  . Latex Hives  . Eggs Or Egg-Derived Products Rash    Consultations:  Pulmonology    Procedures/Studies: Dg Chest 2 View  Result Date: 11/02/2017  CLINICAL DATA:  Sob, Hx of asthma, Nurse stated pt unstable to bring to xray. pcxr done EXAM: CHEST - 2 VIEW COMPARISON:  10/14/2017 FINDINGS: Patient rotated minimally right. Midline trachea. Normal heart size and mediastinal contours. No pleural effusion or pneumothorax. Clear lungs. IMPRESSION: No acute cardiopulmonary disease. Electronically Signed   By: Jeronimo GreavesKyle  Talbot M.D.   On: 11/02/2017 22:33   Dg Chest Portable 1 View  Result Date: 10/14/2017 CLINICAL DATA:  Patient presents with wheezing and cough. Asthma attack. EXAM: PORTABLE CHEST 1 VIEW COMPARISON:  09/19/2017 FINDINGS: The heart size and mediastinal contours are within normal limits. Both lungs are clear. The visualized skeletal structures are unremarkable. IMPRESSION: No active disease. Electronically Signed   By: Tollie Ethavid  Kwon M.D.   On: 10/14/2017 22:11    Discharge Exam: Vitals:   11/04/17 0825 11/04/17 1326  BP:  123/63  Pulse: 87 82  Resp: 18 20  Temp:  98.2 F (36.8 C)  SpO2: 96% 98%   Vitals:   11/04/17 0014 11/04/17 0523 11/04/17  0825 11/04/17 1326  BP:  108/63  123/63  Pulse:  87 87 82  Resp:  15 18 20   Temp:  97.9 F (36.6 C)  98.2 F (36.8 C)  TempSrc:  Oral  Oral  SpO2: 97% 96% 96% 98%  Weight:  95.2 kg (209 lb 14.1 oz)      General: Pt is alert, awake, not in acute distress Cardiovascular: RRR, S1/S2 +, no rubs, no gallops Respiratory: Good air entry, bilateral late expiratory wheezing, no rales  Abdominal: Soft, NT, ND, bowel sounds + Extremities: no edema  The results of significant diagnostics from this hospitalization (including imaging, microbiology, ancillary and laboratory) are listed below for reference.     Microbiology: No results found for this or any previous visit (from the past 240 hour(s)).   Labs: BNP (last 3 results) Recent Labs    10/14/17 2150 11/02/17 2154  BNP 27.5 24.6   Basic Metabolic Panel: Recent Labs  Lab 11/02/17 2154 11/03/17 0233 11/03/17 0520 11/04/17 0537  NA 144  --  147* 141  K 4.3  --  4.9 4.7  CL 105  --  109 104  CO2 30  --  30 29  GLUCOSE 99  --  180* 170*  BUN 9  --  8 13  CREATININE 0.73 0.73 0.59 0.60  CALCIUM 10.1  --  10.0 10.9*  MG  --   --  2.2  --    Liver Function Tests: Recent Labs  Lab 11/03/17 0520  AST 22  ALT 17  ALKPHOS 83  BILITOT 0.4  PROT 7.3  ALBUMIN 3.6   No results for input(s): LIPASE, AMYLASE in the last 168 hours. No results for input(s): AMMONIA in the last 168 hours. CBC: Recent Labs  Lab 11/02/17 2154 11/03/17 0233 11/03/17 0520 11/04/17 0537  WBC 9.7 9.2 7.7 14.7*  NEUTROABS  --   --   --  12.6*  HGB 12.7 12.7 12.3 12.7  HCT 42.1 41.8 41.2 42.0  MCV 93.6 93.7 93.4 92.9  PLT 284 263 264 287   Cardiac Enzymes: Recent Labs  Lab 11/03/17 0520 11/03/17 1335  TROPONINI <0.03 <0.03   BNP: Invalid input(s): POCBNP CBG: Recent Labs  Lab 11/03/17 1157 11/03/17 1723 11/03/17 2316 11/04/17 0632 11/04/17 1137  GLUCAP 115* 140* 177* 175* 146*   D-Dimer No results for input(s): DDIMER in the  last 72 hours. Hgb A1c No results for input(s): HGBA1C in the  last 72 hours. Lipid Profile No results for input(s): CHOL, HDL, LDLCALC, TRIG, CHOLHDL, LDLDIRECT in the last 72 hours. Thyroid function studies No results for input(s): TSH, T4TOTAL, T3FREE, THYROIDAB in the last 72 hours.  Invalid input(s): FREET3 Anemia work up No results for input(s): VITAMINB12, FOLATE, FERRITIN, TIBC, IRON, RETICCTPCT in the last 72 hours. Urinalysis    Component Value Date/Time   COLORURINE YELLOW 03/31/2017 1500   APPEARANCEUR CLEAR 03/31/2017 1500   LABSPEC 1.016 03/31/2017 1500   PHURINE 7.0 03/31/2017 1500   GLUCOSEU NEGATIVE 03/31/2017 1500   HGBUR NEGATIVE 03/31/2017 1500   BILIRUBINUR NEGATIVE 03/31/2017 1500   KETONESUR NEGATIVE 03/31/2017 1500   PROTEINUR NEGATIVE 03/31/2017 1500   UROBILINOGEN 0.2 07/03/2014 1456   NITRITE NEGATIVE 03/31/2017 1500   LEUKOCYTESUR NEGATIVE 03/31/2017 1500   Sepsis Labs Invalid input(s): PROCALCITONIN,  WBC,  LACTICIDVEN Microbiology No results found for this or any previous visit (from the past 240 hour(s)).   Time coordinating discharge: 35 minutes  SIGNED:  Latrelle Dodrill, MD  [facility] 11/04/2017, 2:00 PM  Pager please text page via  [email or web address]  Note - This record has been created using [employer]. Chart creation errors have been sought, but may not always have been located. Such creation errors do not reflect on the standard of medical care.

## 2017-11-04 NOTE — Care Management Note (Signed)
Case Management Note  Patient Details  Name: Sonia Drake MRN: [id number] Date of Birth: 1956-01-02  Subjective/Objective: Received call from nursing for home 02 travel tank. Patient active w/[facility] rep Clydie BraunKaren aware to deliver travel tank to rm prior d/c. No further CM needs.                   Action/Plan:d/c home w/travel 02 tank   Expected Discharge Date:  11/04/17               Expected Discharge Plan:  Home/Self Care  In-House Referral:     Discharge planning Services  CM Consult  Post Acute Care Choice:  Durable Medical Equipment(Active w/[facility] home 02) Choice offered to:     DME Arranged:    DME Agency:     HH Arranged:    HH Agency:     Status of Service:  Completed, signed off  If discussed at [employer] Meetings, dates discussed:    Additional Comments:  Lanier ClamMahabir, Kanika Bungert, RN 11/04/2017, 4:44 PM

## 2017-11-11 ENCOUNTER — Ambulatory Visit (INDEPENDENT_AMBULATORY_CARE_PROVIDER_SITE_OTHER): Payer: Medicaid Other | Admitting: Primary Care

## 2017-11-11 ENCOUNTER — Encounter: Payer: Self-pay | Admitting: Primary Care

## 2017-11-11 ENCOUNTER — Other Ambulatory Visit (INDEPENDENT_AMBULATORY_CARE_PROVIDER_SITE_OTHER): Payer: Medicaid Other

## 2017-11-11 VITALS — BP 126/70 | HR 92 | Ht 63.0 in | Wt 207.4 lb

## 2017-11-11 DIAGNOSIS — J449 Chronic obstructive pulmonary disease, unspecified: Secondary | ICD-10-CM | POA: Diagnosis not present

## 2017-11-11 DIAGNOSIS — J9601 Acute respiratory failure with hypoxia: Secondary | ICD-10-CM

## 2017-11-11 DIAGNOSIS — J45909 Unspecified asthma, uncomplicated: Secondary | ICD-10-CM | POA: Diagnosis not present

## 2017-11-11 DIAGNOSIS — J40 Bronchitis, not specified as acute or chronic: Secondary | ICD-10-CM | POA: Insufficient documentation

## 2017-11-11 DIAGNOSIS — J441 Chronic obstructive pulmonary disease with (acute) exacerbation: Secondary | ICD-10-CM | POA: Diagnosis not present

## 2017-11-11 LAB — BASIC METABOLIC PANEL
BUN: 8 mg/dL (ref 6–23)
CO2: 33 mEq/L — ABNORMAL HIGH (ref 19–32)
Calcium: 9.4 mg/dL (ref 8.4–10.5)
Chloride: 103 mEq/L (ref 96–112)
Creatinine, Ser: 0.68 mg/dL (ref 0.40–1.20)
GFR: 112.09 mL/min (ref >60.00)
Glucose, Bld: 200 mg/dL — ABNORMAL HIGH (ref 70–99)
Potassium: 3.3 mEq/L — ABNORMAL LOW (ref 3.5–5.1)
Sodium: 142 mEq/L (ref 135–145)

## 2017-11-11 LAB — CBC
HCT: 38.6 % (ref 36.0–46.0)
Hemoglobin: 12.5 g/dL (ref 12.0–15.0)
MCHC: 32.4 g/dL (ref 30.0–36.0)
MCV: 88.9 fl (ref 78.0–100.0)
Platelets: 237 10*3/uL (ref 150.0–400.0)
RBC: 4.34 Mil/uL (ref 3.87–5.11)
RDW: 15.8 % — ABNORMAL HIGH (ref 11.5–15.5)
WBC: 6.9 10*3/uL (ref 4.0–10.5)

## 2017-11-11 NOTE — Assessment & Plan Note (Signed)
-   Recent hosp stay 7/14 for COPD exacerbation, ?bronchitis. Initially treated with bipap, empiric abx, solu-medrol and nebs - Doing well today, breathing has improved. VSS, 02 sat 93% RA

## 2017-11-11 NOTE — Assessment & Plan Note (Signed)
-   Recent hosp stay in July for copd exac and ?bronchitis  - Afebrile, VSS. Continues to have congested cough with thick mucus - Procalcitonin negative, Levaquin discontinued.  - Add mucinex, flonase and zyrtec

## 2017-11-11 NOTE — Progress Notes (Signed)
@Patient  ID: Sonia Drake, female    DOB: April 07, 1956, 62 y.o.   MRN: [id number]  Chief Complaint  Patient presents with  . Hospitalization Follow-up    d/c 11/04/17- breathing has improved since discomfort. reports of occ wheezing.     Referring provider: Verlon Au, MD  HPI: 62 year old female, never smoked. Hx COPD, asthma, GERD, chronic resp failure on 2L home oxygen, diastolic HF. Patient had asthma and a lot of allergies as a child. Patient was not previously followed by [facility], per phone note patient was discharge from previous practice in 2007(? reason). Primary care is Virl Son, MD with [facility].   Recent [place] admission from 7/14-7/16 for COPD exacerbation, ?bronchitis. Patient complained of wheezing and dyspnea. Initially treated with bipap, empiric abx, solu-medrol and nebs. Transitioned to oral prednisone. Procalcitonin negative, Levaquin discontinued. Discharged home with Symbicort 160, Spiriva and Singulair. Plan fu with PCP in 1-2 weeks and pulmonary. Needs to recheck BMP/CBC to follow WBC and renal function.    11/11/2017 Presents today for hospital follow-up. Patient is feeling well. Complains of thick mucus, dif expectorating. She has a flutter valve at home that she hasn't used recently.Taking Symbicort BID, Spiriva qd and Singulair. Requires rescue inhaler 3-4 times a week (typically if she is walking long distance or exercising). Wears home oxygen at night, 2L. Has seasonal allergies, winter is the best time of the year for her breathing. She is overweight. Stays active, likes aerobics and swimming.   Spirometry today- FVC 1.22 (50%), FEV1 0.73 (38%), Ratio 60 (76%)   Allergies  Allergen Reactions  . Contrast Media [Iodinated Diagnostic Agents] Anaphylaxis and Other (See Comments)    ** CARDIAC ARREST **  . Peanuts [Peanut Oil] Shortness Of Breath and Swelling  . Shellfish Allergy Shortness Of Breath and Swelling  .  Soap Shortness Of Breath and Swelling    LAUNDRY DETERGENT  . Latex Hives  . Eggs Or Egg-Derived Products Rash    Immunization History  Administered Date(s) Administered  . Pneumococcal Polysaccharide-23 02/17/2017    Past Medical History:  Diagnosis Date  . Arthritis    "right knee; left hip" (04/07/2017)  . Asthma   . CHF (congestive heart failure) (HCC)    reported treatment for fluid overload in the setting of asthma exacerbation ~ 1998  . COPD (chronic obstructive pulmonary disease) (HCC)   . 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 Dyspnea   . Dysrhythmia    occasional skipped beats  . GERD (gastroesophageal reflux disease)   . On home oxygen therapy    "I sleep with 2L" (04/07/2017)  . Pneumonia    "several times" (04/07/2017)  . Seizures (HCC) 2003 X 1   unknown cause    Tobacco History: Social History   Tobacco Use  Smoking Status Never Smoker  Smokeless Tobacco Never Used   Counseling given: Not Answered   Outpatient Medications Prior to Visit  Medication Sig Dispense Refill  . albuterol (PROVENTIL HFA;VENTOLIN HFA) 108 (90 Base) MCG/ACT inhaler Inhale 1-2 puffs into the lungs every 6 (six) hours as needed for wheezing or shortness of breath. 1 Inhaler 3  . albuterol (PROVENTIL) (2.5 MG/3ML) 0.083% nebulizer solution Take 2.5 mg by nebulization every 6 (six) hours as needed for wheezing or shortness of breath.    . budesonide-formoterol (SYMBICORT) 160-4.5 MCG/ACT inhaler Inhale 2 puffs into the lungs 2 (two) times daily.    Marland Kitchen CALCIUM PO Take 1  tablet by mouth daily.    . Cholecalciferol (VITAMIN D3 PO) Take 1 tablet by mouth daily.    . montelukast (SINGULAIR) 10 MG tablet Take 10 mg by mouth daily.     . pantoprazole (PROTONIX) 20 MG tablet Take 20 mg by mouth daily as needed for indigestion.    Marland Kitchen. tiotropium (SPIRIVA) 18 MCG inhalation capsule Place 18 mcg into inhaler and inhale daily.     Marland Kitchen. triamcinolone lotion (KENALOG) 0.1 % Apply 1  application topically 2 (two) times daily as needed (for rash).     . predniSONE (DELTASONE) 10 MG tablet Take 4 tablets for 3 days; Take 3 tablets for 4 days; Take 2 tablets for 3 days; Take 1 tablet for 4 days 34 tablet 0   No facility-administered medications prior to visit.       Review of Systems  Review of Systems  Constitutional: Negative.   HENT: Positive for congestion.   Respiratory: Positive for cough and wheezing. Negative for apnea, choking, chest tightness, shortness of breath and stridor.        NP cough, thick mucus dif to get up  Cardiovascular: Negative.   Gastrointestinal: Negative.   Neurological: Negative.   Psychiatric/Behavioral: Negative.      Physical Exam  BP 126/70 (BP Location: Left Arm, Cuff Size: Normal)   Pulse 92   Ht 5\' 3"  (1.6 m)   Wt 207 lb 6.4 oz (94.1 kg)   SpO2 93%   BMI 36.74 kg/m  Physical Exam  Constitutional: She is oriented to person, place, and time. She appears well-developed and well-nourished.  Overweight   HENT:  Head: Normocephalic and atraumatic.  Eyes: Pupils are equal, round, and reactive to light. EOM are normal.  Neck: Normal range of motion. Neck supple.  Cardiovascular: Normal rate and regular rhythm.  No murmur heard. Pulmonary/Chest: Effort normal. No respiratory distress. She has wheezes.  LS with diffuse exp wheeze, rhochi clears with cough   Abdominal: Soft. Bowel sounds are normal. There is no tenderness.  Neurological: She is alert and oriented to person, place, and time.  Skin: Skin is warm and dry. No rash noted. No erythema.  Psychiatric: She has a normal mood and affect. Her behavior is normal. Judgment normal.     Lab Results:  CBC    Component Value Date/Time   WBC 14.7 (H) 11/04/2017 0537   RBC 4.52 11/04/2017 0537   HGB 12.7 11/04/2017 0537   HCT 42.0 11/04/2017 0537   PLT 287 11/04/2017 0537   MCV 92.9 11/04/2017 0537   MCH 28.1 11/04/2017 0537   MCHC 30.2 11/04/2017 0537   RDW 15.5  11/04/2017 0537   LYMPHSABS 1.5 11/04/2017 0537   MONOABS 0.5 11/04/2017 0537   EOSABS 0.0 11/04/2017 0537   BASOSABS 0.0 11/04/2017 0537    BMET    Component Value Date/Time   NA 141 11/04/2017 0537   K 4.7 11/04/2017 0537   CL 104 11/04/2017 0537   CO2 29 11/04/2017 0537   GLUCOSE 170 (H) 11/04/2017 0537   BUN 13 11/04/2017 0537   CREATININE 0.60 11/04/2017 0537   CALCIUM 10.9 (H) 11/04/2017 0537   GFRNONAA >60 11/04/2017 0537   GFRAA >60 11/04/2017 0537    BNP    Component Value Date/Time   BNP 24.6 11/02/2017 2154    ProBNP No results found for: PROBNP  Imaging: Dg Chest 2 View  Result Date: 11/02/2017 CLINICAL DATA:  Sob, Hx of asthma, Nurse stated pt unstable to bring  to xray. pcxr done EXAM: CHEST - 2 VIEW COMPARISON:  10/14/2017 FINDINGS: Patient rotated minimally right. Midline trachea. Normal heart size and mediastinal contours. No pleural effusion or pneumothorax. Clear lungs. IMPRESSION: No acute cardiopulmonary disease. Electronically Signed   By: Jeronimo Greaves M.D.   On: 11/02/2017 22:33   Dg Chest Portable 1 View  Result Date: 10/14/2017 CLINICAL DATA:  Patient presents with wheezing and cough. Asthma attack. EXAM: PORTABLE CHEST 1 VIEW COMPARISON:  09/19/2017 FINDINGS: The heart size and mediastinal contours are within normal limits. Both lungs are clear. The visualized skeletal structures are unremarkable. IMPRESSION: No active disease. Electronically Signed   By: Tollie Eth M.D.   On: 10/14/2017 22:11     Assessment & Plan:   Chronic obstructive pulmonary disease with acute exacerbation (HCC) - New patient to [facility] - Hx COPD and asthma, on home O2 at night - Started on Spiriva, Symbicort 160 and Singulair  - Spirometry today showing moderate-severe restriction, no obstruction.  - FU with Dr. Wynona Neat in 3-4 months    Acute respiratory failure with hypoxia (HCC) - Recent hosp stay 7/14 for COPD exacerbation, ?bronchitis. Initially  treated with bipap, empiric abx, solu-medrol and nebs - Doing well today, breathing has improved. VSS, 02 sat 93% RA  Asthma - Hx asthma and allergies as a child. Never smoked.  - Continues Spiriva, symbicort 160 and Singulair  - Check IgE, has had allergy testing in the past  Bronchitis - Recent hosp stay in July for copd exac and ?bronchitis  - Afebrile, VSS. Continues to have congested cough with thick mucus - Procalcitonin negative, Levaquin discontinued.  - Add mucinex, flonase and zyrtec      Glenford Bayley, NP 11/11/2017

## 2017-11-11 NOTE — Patient Instructions (Addendum)
Follow up in 3-4 months with Dr. Tonye Royaltylaere (new patient) Labs today FU with PCP next Monday  Contiune: Symbicort twice daily Spiriva once daily As needed recuse inhale Singulair  Flutter valve -use three times a day   New: Mucinex - take twice a day with full glass of water  Flonase nasal spray  Add zyrtec daily  (all of these are over the counter)

## 2017-11-11 NOTE — Assessment & Plan Note (Signed)
-   Hx asthma and allergies as a child. Never smoked.  - Continues Spiriva, symbicort 160 and Singulair  - Check IgE, has had allergy testing in the past

## 2017-11-11 NOTE — Assessment & Plan Note (Addendum)
-   New patient to [facility] - Hx COPD and asthma, on home O2 at night - Started on Spiriva, Symbicort 160 and Singulair  - Spirometry today showing moderate-severe restriction, no obstruction.  - FU with Dr. Wynona Neatlalere in 3-4 months

## 2017-11-12 LAB — IGE: IgE (Immunoglobulin E), Serum: 66 kU/L (ref <=114)

## 2018-02-16 ENCOUNTER — Observation Stay (HOSPITAL_COMMUNITY)
Admission: EM | Admit: 2018-02-16 | Discharge: 2018-02-18 | Disposition: A | Discharge status: Home / Self Care | Payer: Medicaid Other | Attending: Internal Medicine | Admitting: Internal Medicine

## 2018-02-16 ENCOUNTER — Other Ambulatory Visit: Payer: Self-pay

## 2018-02-16 ENCOUNTER — Emergency Department (HOSPITAL_COMMUNITY): Payer: Medicaid Other

## 2018-02-16 ENCOUNTER — Encounter (HOSPITAL_COMMUNITY): Payer: Self-pay

## 2018-02-16 DIAGNOSIS — Z96642 Presence of left artificial hip joint: Secondary | ICD-10-CM | POA: Diagnosis not present

## 2018-02-16 DIAGNOSIS — I5032 Chronic diastolic (congestive) heart failure: Secondary | ICD-10-CM | POA: Insufficient documentation

## 2018-02-16 DIAGNOSIS — K219 Gastro-esophageal reflux disease without esophagitis: Secondary | ICD-10-CM | POA: Diagnosis not present

## 2018-02-16 DIAGNOSIS — J4551 Severe persistent asthma with (acute) exacerbation: Principal | ICD-10-CM | POA: Insufficient documentation

## 2018-02-16 DIAGNOSIS — R739 Hyperglycemia, unspecified: Secondary | ICD-10-CM | POA: Insufficient documentation

## 2018-02-16 DIAGNOSIS — Z79899 Other long term (current) drug therapy: Secondary | ICD-10-CM | POA: Insufficient documentation

## 2018-02-16 DIAGNOSIS — R0602 Shortness of breath: Secondary | ICD-10-CM | POA: Diagnosis present

## 2018-02-16 DIAGNOSIS — Z9981 Dependence on supplemental oxygen: Secondary | ICD-10-CM | POA: Insufficient documentation

## 2018-02-16 DIAGNOSIS — R569 Unspecified convulsions: Secondary | ICD-10-CM

## 2018-02-16 DIAGNOSIS — J45901 Unspecified asthma with (acute) exacerbation: Secondary | ICD-10-CM | POA: Diagnosis present

## 2018-02-16 DIAGNOSIS — Z7951 Long term (current) use of inhaled steroids: Secondary | ICD-10-CM | POA: Diagnosis not present

## 2018-02-16 DIAGNOSIS — J449 Chronic obstructive pulmonary disease, unspecified: Secondary | ICD-10-CM | POA: Insufficient documentation

## 2018-02-16 DIAGNOSIS — Z87898 Personal history of other specified conditions: Secondary | ICD-10-CM

## 2018-02-16 LAB — CBC WITH DIFFERENTIAL/PLATELET
Abs Immature Granulocytes: 0.02 10*3/uL (ref 0.00–0.07)
Basophils Absolute: 0.1 10*3/uL (ref 0.0–0.1)
Basophils Relative: 1 %
Eosinophils Absolute: 0.6 10*3/uL — ABNORMAL HIGH (ref 0.0–0.5)
Eosinophils Relative: 8 %
HCT: 39.6 % (ref 36.0–46.0)
Hemoglobin: 11.7 g/dL — ABNORMAL LOW (ref 12.0–15.0)
Immature Granulocytes: 0 %
Lymphocytes Relative: 28 %
Lymphs Abs: 2 10*3/uL (ref 0.7–4.0)
MCH: 28 pg (ref 26.0–34.0)
MCHC: 29.5 g/dL — ABNORMAL LOW (ref 30.0–36.0)
MCV: 94.7 fL (ref 80.0–100.0)
Monocytes Absolute: 0.5 10*3/uL (ref 0.1–1.0)
Monocytes Relative: 7 %
Neutro Abs: 3.9 10*3/uL (ref 1.7–7.7)
Neutrophils Relative %: 56 %
Platelets: 229 10*3/uL (ref 150–400)
RBC: 4.18 MIL/uL (ref 3.87–5.11)
RDW: 14.8 % (ref 11.5–15.5)
WBC: 7.1 10*3/uL (ref 4.0–10.5)
nRBC: 0 % (ref 0.0–0.2)

## 2018-02-16 LAB — BASIC METABOLIC PANEL
Anion gap: 6 (ref 5–15)
BUN: 10 mg/dL (ref 8–23)
CO2: 31 mmol/L (ref 22–32)
Calcium: 9.3 mg/dL (ref 8.9–10.3)
Chloride: 107 mmol/L (ref 98–111)
Creatinine, Ser: 0.62 mg/dL (ref 0.44–1.00)
GFR calc Af Amer: >60 mL/min (ref >60)
GFR calc non Af Amer: >60 mL/min (ref >60)
Glucose, Bld: 103 mg/dL — ABNORMAL HIGH (ref 70–99)
Potassium: 4 mmol/L (ref 3.5–5.1)
Sodium: 144 mmol/L (ref 135–145)

## 2018-02-16 LAB — GLUCOSE, CAPILLARY
Glucose-Capillary: 97 mg/dL (ref 70–99)
Glucose-Capillary: 183 mg/dL — ABNORMAL HIGH (ref 70–99)
Glucose-Capillary: 228 mg/dL — ABNORMAL HIGH (ref 70–99)

## 2018-02-16 LAB — BRAIN NATRIURETIC PEPTIDE: B Natriuretic Peptide: 20 pg/mL (ref 0.0–100.0)

## 2018-02-16 LAB — PHOSPHORUS: Phosphorus: 3.1 mg/dL (ref 2.5–4.6)

## 2018-02-16 LAB — MAGNESIUM: Magnesium: 1.7 mg/dL (ref 1.7–2.4)

## 2018-02-16 MED ORDER — IPRATROPIUM-ALBUTEROL 0.5-2.5 (3) MG/3ML IN SOLN
3.0000 mL | Freq: Four times a day (QID) | RESPIRATORY_TRACT | Status: DC
  Administered 2018-02-16 – 2018-02-18 (×8): 3 mL via RESPIRATORY_TRACT
  Filled 2018-02-16 (×8): qty 3

## 2018-02-16 MED ORDER — PANTOPRAZOLE SODIUM 20 MG PO TBEC
20.0000 mg | DELAYED_RELEASE_TABLET | Freq: Every day | ORAL | Status: DC | PRN
  Filled 2018-02-16: qty 1

## 2018-02-16 MED ORDER — ALBUTEROL (5 MG/ML) CONTINUOUS INHALATION SOLN
10.0000 mg/h | INHALATION_SOLUTION | Freq: Once | RESPIRATORY_TRACT | Status: AC
  Administered 2018-02-16: 10 mg/h via RESPIRATORY_TRACT
  Filled 2018-02-16: qty 20

## 2018-02-16 MED ORDER — METHYLPREDNISOLONE SODIUM SUCC 125 MG IJ SOLR
125.0000 mg | Freq: Once | INTRAMUSCULAR | Status: AC
  Administered 2018-02-16: 125 mg via INTRAVENOUS
  Filled 2018-02-16: qty 2

## 2018-02-16 MED ORDER — MONTELUKAST SODIUM 10 MG PO TABS: 10.0000 mg | ORAL_TABLET | Freq: Every day | ORAL | Status: DC

## 2018-02-16 MED ORDER — METHYLPREDNISOLONE SODIUM SUCC 40 MG IJ SOLR
40.0000 mg | Freq: Four times a day (QID) | INTRAMUSCULAR | Status: AC
  Administered 2018-02-16 – 2018-02-17 (×3): 40 mg via INTRAVENOUS
  Filled 2018-02-16 (×3): qty 1

## 2018-02-16 MED ORDER — PREDNISONE 20 MG PO TABS
40.0000 mg | ORAL_TABLET | Freq: Every day | ORAL | Status: DC
  Administered 2018-02-18: 40 mg via ORAL
  Filled 2018-02-16: qty 2

## 2018-02-16 MED ORDER — ALBUTEROL SULFATE (2.5 MG/3ML) 0.083% IN NEBU
5.0000 mg | INHALATION_SOLUTION | Freq: Once | RESPIRATORY_TRACT | Status: DC
  Filled 2018-02-16: qty 6

## 2018-02-16 MED ORDER — HEPARIN SODIUM (PORCINE) 5000 UNIT/ML IJ SOLN: 5000.0000 [IU] | Freq: Three times a day (TID) | INTRAMUSCULAR | Status: DC

## 2018-02-16 MED ORDER — HEPARIN SODIUM (PORCINE) 5000 UNIT/ML IJ SOLN
5000.0000 [IU] | Freq: Three times a day (TID) | INTRAMUSCULAR | Status: DC
  Administered 2018-02-16: 5000 [IU] via SUBCUTANEOUS
  Filled 2018-02-16 (×2): qty 1

## 2018-02-16 MED ORDER — ACETAMINOPHEN 650 MG RE SUPP: 650.0000 mg | Freq: Four times a day (QID) | RECTAL | Status: DC | PRN

## 2018-02-16 MED ORDER — MONTELUKAST SODIUM 10 MG PO TABS
10.0000 mg | ORAL_TABLET | Freq: Every day | ORAL | Status: DC
  Administered 2018-02-16 – 2018-02-17 (×2): 10 mg via ORAL
  Filled 2018-02-16 (×2): qty 1

## 2018-02-16 MED ORDER — IPRATROPIUM BROMIDE 0.02 % IN SOLN
0.5000 mg | Freq: Once | RESPIRATORY_TRACT | Status: AC
  Administered 2018-02-16: 0.5 mg via RESPIRATORY_TRACT
  Filled 2018-02-16: qty 2.5

## 2018-02-16 MED ORDER — ACETAMINOPHEN 325 MG PO TABS: 650.0000 mg | ORAL_TABLET | Freq: Four times a day (QID) | ORAL | Status: DC | PRN

## 2018-02-16 MED ORDER — CALCIUM CARBONATE-VITAMIN D 500-200 MG-UNIT PO TABS
1.0000 | ORAL_TABLET | Freq: Every day | ORAL | Status: DC
  Administered 2018-02-17 – 2018-02-18 (×2): 1 via ORAL
  Filled 2018-02-16 (×2): qty 1

## 2018-02-16 NOTE — ED Provider Notes (Signed)
[facility]-EMERGENCY DEPT Provider Note   CSN: [id number] Arrival date & time: 02/16/18  0859     History   Chief Complaint Chief Complaint  Patient presents with  . Shortness of Breath    HPI Sonia Drake is a 62 y.o. female.  HPI Patient presents with shortness of breath.  Started yesterday after being exposed to her children raking leaves.  States that she has had 6 breathing treatments since yesterday afternoon without any relief.  Has had a cough with some black sputum production.  Some chest tightness.  No fevers or chills.  Has a history of asthma and states this feels the same.  No abdominal pain.  No swelling in her legs. Past Medical History:  Diagnosis Date  . Arthritis    "right knee; left hip" (04/07/2017)  . Asthma   . CHF (congestive heart failure) (HCC)    reported treatment for fluid overload in the setting of asthma exacerbation ~ 1998  . COPD (chronic obstructive pulmonary disease) (HCC)    patient states she does not have COPD but was treated for it.   . 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 Dyspnea   . Dysrhythmia    occasional skipped beats  . GERD (gastroesophageal reflux disease)   . On home oxygen therapy    "I sleep with 2L" (04/07/2017)  . Pneumonia    "several times" (04/07/2017)  . Seizures (HCC) 2003 X 1   unknown cause    Patient Active Problem List   Diagnosis Date Noted  . Bronchitis 11/11/2017  . Acute respiratory failure (HCC) 11/03/2017  . Respiratory distress   . Acute respiratory failure with hypoxia (HCC) 11/02/2017  . Chronic diastolic CHF (congestive heart failure) (HCC) 09/19/2017  . Primary osteoarthritis of left hip 04/07/2017  . Osteoarthritis of left hip 04/02/2017  . Lung nodule 02/04/2016  . COPD exacerbation (HCC) 07/21/2015  . Influenza B 04/23/2015  . Asthma exacerbation 04/22/2015  . Hypokalemia 04/22/2015  . 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 Influenza with  pneumonia 07/03/2014  . Multiple lung nodules on CT 07/03/2014  . Asthma 07/02/2014  . Hyperglycemia 07/02/2014  . Chronic obstructive pulmonary disease with acute exacerbation (HCC)   . Prolonged QT interval 06/29/2014    Past Surgical History:  Procedure Laterality Date  . JOINT REPLACEMENT    . KNEE ARTHROSCOPY Right 2017  . 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 TOTAL HIP ARTHROPLASTY Left 04/07/2017   Procedure: LEFT TOTAL HIP ARTHROPLASTY ANTERIOR APPROACH;  Surgeon: Gean Birchwood, MD;  Location: [facility] OR;  Service: Orthopedics;  Laterality: Left;     OB History   None      Home Medications    Prior to Admission medications   Medication Sig Start Date End Date Taking? Authorizing Provider  albuterol (PROVENTIL HFA;VENTOLIN HFA) 108 (90 Base) MCG/ACT inhaler Inhale 1-2 puffs into the lungs every 6 (six) hours as needed for wheezing or shortness of breath. 02/18/17  Yes Dorothea Ogle, MD  albuterol (PROVENTIL) (2.5 MG/3ML) 0.083% nebulizer solution Take 2.5 mg by nebulization every 6 (six) hours as needed for wheezing or shortness of breath.   Yes [provider]  budesonide-formoterol (SYMBICORT) 160-4.5 MCG/ACT inhaler Inhale 2 puffs into the lungs 2 (two) times daily.   Yes [provider]  calcium-vitamin D (OSCAL WITH D) 500-200 MG-UNIT tablet Take 1 tablet by mouth daily with breakfast.   Yes [provider]  montelukast (SINGULAIR) 10 MG tablet  Take 10 mg by mouth daily.    Yes [provider]  pantoprazole (PROTONIX) 20 MG tablet Take 20 mg by mouth daily as needed for indigestion.   Yes [provider]  tiotropium (SPIRIVA) 18 MCG inhalation capsule Place 18 mcg into inhaler and inhale daily.    Yes [provider]  triamcinolone lotion (KENALOG) 0.1 % Apply 1 application topically 2 (two) times daily as needed (for rash).  08/29/15  Yes [provider]    Family History Family History  Problem Relation  Age of Onset  . Heart disease Mother   . Heart disease Father   . Diabetes Father   . Asthma Father   . Diabetes Brother   . Diabetes Sister   . Asthma Brother   . Asthma Sister   . Hypertension Sister     Social History Social History   Tobacco Use  . Smoking status: Never Smoker  . Smokeless tobacco: Never Used  Substance Use Topics  . Alcohol use: No  . Drug use: No     Allergies   Contrast media [iodinated diagnostic agents]; Peanuts [peanut oil]; Shellfish allergy; Soap; Latex; and Eggs or egg-derived products   Review of Systems Review of Systems  Constitutional: Negative for appetite change.  HENT: Positive for congestion.   Respiratory: Positive for cough, shortness of breath and wheezing.   Cardiovascular: Positive for chest pain. Negative for leg swelling.  Gastrointestinal: Negative for abdominal pain.  Genitourinary: Negative for flank pain.  Musculoskeletal: Negative for back pain.  Skin: Negative for rash.  Neurological: Negative for weakness.  Hematological: Negative for adenopathy.  Psychiatric/Behavioral: Negative for confusion.     Physical Exam Updated Vital Signs BP 134/77 (BP Location: Left Arm)   Pulse 80   Temp 98.5 F (36.9 C) (Oral)   Resp (!) 21   SpO2 100%   Physical Exam  Constitutional: She appears well-developed.  HENT:  Head: Normocephalic.  Eyes: Pupils are equal, round, and reactive to light.  Neck: Neck supple.  Cardiovascular: Regular rhythm.  Pulmonary/Chest:  Tachypnea.  Audible wheezes and harsh breath sounds.  Abdominal: There is no tenderness.  Musculoskeletal:       Right lower leg: She exhibits no tenderness.       Left lower leg: She exhibits no tenderness.  Neurological: She is alert.  Skin: Skin is warm. Capillary refill takes less than 2 seconds.     ED Treatments / Results  Labs (all labs ordered are listed, but only abnormal results are displayed) Labs Reviewed  BASIC METABOLIC PANEL - Abnormal;  Notable for the following components:      Result Value   Glucose, Bld 103 (*)    All other components within normal limits  CBC WITH DIFFERENTIAL/PLATELET - Abnormal; Notable for the following components:   Hemoglobin 11.7 (*)    MCHC 29.5 (*)    Eosinophils Absolute 0.6 (*)    All other components within normal limits    EKG None  Radiology Dg Chest Portable 1 View  Result Date: 02/16/2018 CLINICAL DATA:  Shortness of breath EXAM: PORTABLE CHEST 1 VIEW COMPARISON:  11/02/2017 FINDINGS: Cardiac shadows within normal limits. The lungs are well aerated bilaterally. No focal infiltrate or sizable effusion is seen. No acute bony abnormality is noted. IMPRESSION: No active disease. Electronically Signed   By: Alcide Clever M.D.   On: 02/16/2018 10:09    Procedures Procedures (including critical care time)  Medications Ordered in ED Medications  albuterol (PROVENTIL,VENTOLIN)  solution continuous neb (10 mg/hr Nebulization Given 02/16/18 0944)  ipratropium (ATROVENT) nebulizer solution 0.5 mg (0.5 mg Nebulization Given 02/16/18 0944)  methylPREDNISolone sodium succinate (SOLU-MEDROL) 125 mg/2 mL injection 125 mg (125 mg Intravenous Given 02/16/18 0935)     Initial Impression / Assessment and Plan / ED Course  I have reviewed the triage vital signs and the nursing notes.  Pertinent labs & imaging results that were available during my care of the patient were reviewed by me and considered in my medical decision making (see chart for details).     Patient with asthma.  History of same.  Worsening shortness of breath.  Has had 6 breathing treatments at home and hour-long nebulizer here.  Continued wheezing and dyspnea.  Will require admission to hospital.  CRITICAL CARE Performed by: Benjiman Core Total critical care time: 30 minutes Critical care time was exclusive of separately billable procedures and treating other patients. Critical care was necessary to treat or prevent  imminent or life-threatening deterioration. Critical care was time spent personally by me on the following activities: development of treatment plan with patient and/or surrogate as well as nursing, discussions with consultants, evaluation of patient's response to treatment, examination of patient, obtaining history from patient or surrogate, ordering and performing treatments and interventions, ordering and review of laboratory studies, ordering and review of radiographic studies, pulse oximetry and re-evaluation of patient's condition.   Final Clinical Impressions(s) / ED Diagnoses   Final diagnoses:  Severe persistent asthma with exacerbation    ED Discharge Orders    None       Benjiman Core, MD 02/16/18 1020

## 2018-02-16 NOTE — H&P (Signed)
History and Physical    Sonia Drake:[id number] DOB: Apr 06, 1956 DOA: 02/16/2018  PCP: Verlon Au, MD   Patient coming from: Home.  I have personally briefly reviewed patient's old medical records in [facility]  Chief Complaint: SOB.  HPI: Sonia Drake is a 62 y.o. female with medical history significant of osteoarthritis of the right knee, left hip, grade 2 diastolic dysfunction, depression, history of unspecified arrhythmia, history of prolonged QT, GERD, history of remote seizures not on medication, history of multiple episodes of pneumonia, asthma who is coming to the emergency department with complaints of progressively worse shortness of breath since yesterday.  However, she states that she started coughing on Saturday evening.  Her cough has been mostly dry, but occasionally she has greenish looking sputum.  She has mild rhinorrhea as well.  She denies fever, sore throat, headache or hemoptysis.  No chest pain, palpitations, dizziness, diaphoresis, PND, orthopnea or pitting edema of the lower extremities.  Denies abdominal pain, but feels a little nauseous, no emesis, no diarrhea or constipation, no melena or hematochezia.  She denies dysuria, frequency or gross hematuria.  No heat or cold intolerance.  No polyuria, polydipsia, polyphagia or blurred vision.  Denies skin pruritus.  ED Course: Initial vital signs temperature 98.5 F, pulse 89, respirations 30, blood pressure 135/100 mmHg and O2 sat 92% on room air.  The patient was given supplemental oxygen, 225 mg of Solu-Medrol and an albuterol 10+ ipratropium 1 mg continuous neb treatment.  The patient states she feels better after treatment.  White count 7.1 with 56% neutrophils, 28% lymphocytes and 7% monocytes.  Hemoglobin 11.7 g/dL and platelets 811.  BMP shows a mildly elevated glucose at 103 mg/dL, but is otherwise within normal limits.  Her chest radiograph does not show any active cardiopulmonary disease.  BNP  was 20.0 pg/mL.  EKG is pending.  Magnesium was 1.7 phosphorus 3.1 mg/dL.  Review of Systems: As per HPI otherwise 10 point review of systems negative.   Past Medical History:  Diagnosis Date  . Arthritis    "right knee; left hip" (04/07/2017)  . Asthma   . CHF (congestive heart failure) (HCC)    reported treatment for fluid overload in the setting of asthma exacerbation ~ 1998  . COPD (chronic obstructive pulmonary disease) (HCC)    patient states she does not have COPD but was treated for it.   . 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 Dyspnea   . Dysrhythmia    occasional skipped beats  . GERD (gastroesophageal reflux disease)   . On home oxygen therapy    "I sleep with 2L" (04/07/2017)  . Pneumonia    "several times" (04/07/2017)  . Seizures (HCC) 2003 X 1   unknown cause    Past Surgical History:  Procedure Laterality Date  . JOINT REPLACEMENT    . KNEE ARTHROSCOPY Right 2017  . 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 She has never used smokeless tobacco. She reports that she does not drink alcohol or use drugs.  Allergies  Allergen Reactions  . Contrast Media [Iodinated Diagnostic Agents] Anaphylaxis and Other (See Comments)    ** CARDIAC ARREST **  . Peanuts [Peanut Oil] Shortness Of Breath and Swelling  .  Shellfish Allergy Shortness Of Breath and Swelling  . Soap Shortness Of Breath and Swelling    LAUNDRY DETERGENT  . Latex Hives  . Eggs Or Egg-Derived Products Rash    Family History  Problem Relation Age of Onset  . Heart disease Mother   . Heart disease Father   . Diabetes Father   . Asthma Father   . Diabetes Brother   . Diabetes Sister   . Asthma Brother   . Asthma Sister   . Hypertension Sister     Prior to Admission medications   Medication Sig Start Date End Date Taking? Authorizing Provider  albuterol (PROVENTIL HFA;VENTOLIN HFA) 108 (90 Base) MCG/ACT inhaler Inhale 1-2 puffs into the lungs every 6 (six) hours as needed for wheezing or shortness of breath. 02/18/17  Yes Dorothea Ogle, MD  albuterol (PROVENTIL) (2.5 MG/3ML) 0.083% nebulizer solution Take 2.5 mg by nebulization every 6 (six) hours as needed for wheezing or shortness of breath.   Yes [provider]  budesonide-formoterol (SYMBICORT) 160-4.5 MCG/ACT inhaler Inhale 2 puffs into the lungs 2 (two) times daily.   Yes [provider]  calcium-vitamin D (OSCAL WITH D) 500-200 MG-UNIT tablet Take 1 tablet by mouth daily with breakfast.   Yes [provider]  montelukast (SINGULAIR) 10 MG tablet Take 10 mg by mouth daily.    Yes [provider]  pantoprazole (PROTONIX) 20 MG tablet Take 20 mg by mouth daily as needed for indigestion.   Yes [provider]  tiotropium (SPIRIVA) 18 MCG inhalation capsule Place 18 mcg into inhaler and inhale daily.    Yes [provider]  triamcinolone lotion (KENALOG) 0.1 % Apply 1 application topically 2 (two) times daily as needed (for rash).  08/29/15  Yes [provider]    Physical Exam: Vitals:   02/16/18 0952 02/16/18 1004 02/16/18 1011 02/16/18 1030  BP:   134/77 (!) 151/83  Pulse: 83 79 80 85  Resp: 18 19 (!) 21 (!) 25  Temp:      TempSrc:      SpO2: 100% 100% 100% 100%    Constitutional: NAD, calm, comfortable Eyes: PERRL, lids and conjunctivae normal ENMT: Mucous membranes are mildly dry. Posterior pharynx clear of any exudate or lesions. Neck: normal, supple, no masses, no thyromegaly Respiratory: Decreased breath sounds with bilateral wheezing.  Mild rhonchi, no crackles.  No accessory muscle use.  Cardiovascular: Regular rate and rhythm, single extrasystole, no murmurs / rubs / gallops. No extremity edema. 2+  pedal pulses. No carotid bruits.  Abdomen: Soft, no tenderness, no masses palpated. No hepatosplenomegaly. Bowel sounds positive.  Musculoskeletal: no clubbing / cyanosis. No joint deformity upper and lower extremities. Good ROM, no contractures. Normal muscle tone.  Skin: no rashes, lesions, ulcers. No induration on limited dermatological examination. Neurologic: CN 2-12 grossly intact. Sensation intact, DTR normal. Strength 5/5 in all 4.  Psychiatric: Normal judgment and insight. Alert and oriented x 3. Normal mood.   Labs on Admission: I have personally reviewed following labs and imaging studies  CBC: Recent Labs  Lab 02/16/18 0938  WBC 7.1  NEUTROABS 3.9  HGB 11.7*  HCT 39.6  MCV 94.7  PLT 229   Basic Metabolic Panel: Recent Labs  Lab 02/16/18 0938  NA 144  K 4.0  CL 107  CO2 31  GLUCOSE 103*  BUN 10  CREATININE 0.62  CALCIUM 9.3  MG 1.7  PHOS 3.1   GFR: CrCl cannot be calculated (Unknown ideal weight.). Liver  Function Tests: No results for input(s): AST, ALT, ALKPHOS, BILITOT, PROT, ALBUMIN in the last 168 hours. No results for input(s): LIPASE, AMYLASE in the last 168 hours. No results for input(s): AMMONIA in the last 168 hours. Coagulation Profile: No results for input(s): INR, PROTIME in the last 168 hours. Cardiac Enzymes: No results for input(s): CKTOTAL, CKMB, CKMBINDEX, TROPONINI in the last 168 hours. BNP (last 3 results) No results for input(s): PROBNP in the last 8760 hours. HbA1C: No results for input(s): HGBA1C in the last 72 hours. CBG: No results for input(s): GLUCAP in the last 168 hours. Lipid Profile: No results for input(s): CHOL, HDL, LDLCALC, TRIG, CHOLHDL, LDLDIRECT in the last 72 hours. Thyroid Function Tests: No results for input(s): TSH, T4TOTAL, FREET4, T3FREE, THYROIDAB in the last 72 hours. Anemia Panel: No results for input(s): VITAMINB12, FOLATE, FERRITIN, TIBC, IRON, RETICCTPCT in the last 72 hours. Urine analysis:     Component Value Date/Time   COLORURINE YELLOW 03/31/2017 1500   APPEARANCEUR CLEAR 03/31/2017 1500   LABSPEC 1.016 03/31/2017 1500   PHURINE 7.0 03/31/2017 1500   GLUCOSEU NEGATIVE 03/31/2017 1500   HGBUR NEGATIVE 03/31/2017 1500   BILIRUBINUR NEGATIVE 03/31/2017 1500   KETONESUR NEGATIVE 03/31/2017 1500   PROTEINUR NEGATIVE 03/31/2017 1500   UROBILINOGEN 0.2 07/03/2014 1456   NITRITE NEGATIVE 03/31/2017 1500   LEUKOCYTESUR NEGATIVE 03/31/2017 1500    Radiological Exams on Admission: Dg Chest Portable 1 View  Result Date: 02/16/2018 CLINICAL DATA:  Shortness of breath EXAM: PORTABLE CHEST 1 VIEW COMPARISON:  11/02/2017 FINDINGS: Cardiac shadows within normal limits. The lungs are well aerated bilaterally. No focal infiltrate or sizable effusion is seen. No acute bony abnormality is noted. IMPRESSION: No active disease. Electronically Signed   By: Alcide Clever M.D.   On: 02/16/2018 10:09   06/29/2014 echo ------------------------------------------------------------------- LV EF: 60% -  65%  ------------------------------------------------------------------- Indications:   Dyspnea 786.09.  ------------------------------------------------------------------- History:  PMH: COPD exacerbation. Prolonged QT interval.  ------------------------------------------------------------------- Study Conclusions  - Left ventricle: The cavity size was normal. Systolic function was normal. The estimated ejection fraction was in the range of 60% to 65%. Wall motion was normal; there were no regional wall motion abnormalities. Features are consistent with a pseudonormal left ventricular filling pattern, with concomitant abnormal relaxation and increased filling pressure (grade 2 diastolic dysfunction).  EKG: Independently reviewed.    Assessment/Plan Principal Problem:   Asthma exacerbation Observation/telemetry. Continue supplemental oxygen. Continue scheduled and as  needed bronchodilators. Solu-Medrol 40 mg IVP every 6 hours x3 doses. Start prednisone 40 mg p.o. daily in the morning.  Active Problems:   Hyperglycemia Carbohydrate modified diet. CBG monitoring while on glucocorticoids. May need regular insulin coverage.    Chronic diastolic CHF (congestive heart failure) (HCC) No signs of decompensation.    GERD (gastroesophageal reflux disease) Protonix 40 mg p.o. daily.    Seizures (HCC) Not on medications. Has not had seizures in quite a while.    History of prolonged Q-T interval on ECG History of tachyarrhythmia. Check EKG.   Magnesium supplementation. Keep on telemetry for 12 to 24 hours.    DVT prophylaxis: Heparin SQ. Code Status: Full code. Family Communication: Disposition Plan: Observation for asthma exacerbation. Consults called: Admission status: Observation/telemetry.  Bobette Mo MD [facility] Pager [phone]  If 7PM-7AM, please contact night-coverage [email or web address] Password [email or web address]  02/16/2018, 11:11 AM

## 2018-02-16 NOTE — ED Notes (Signed)
Respiratory called

## 2018-02-16 NOTE — ED Notes (Signed)
Report given to [place], [place] [phone].

## 2018-02-16 NOTE — ED Triage Notes (Addendum)
Patient c/o Sonia Drake that started yesterday. Hx. Of asthma. Patient took a breathing treatment at 3 pm, 5pm, 10pm. Patient took another breathing treatment at 3am, 5am, 8am (albuterol). Patient has audible wheezes. Patient took Spiriva and Singulair yesterday.

## 2018-02-16 NOTE — Progress Notes (Signed)
Received patient from ED, VS obtained, telemetry monitor applied, oriented to unit, call light placed in reach  

## 2018-02-17 DIAGNOSIS — R569 Unspecified convulsions: Secondary | ICD-10-CM | POA: Diagnosis not present

## 2018-02-17 DIAGNOSIS — K219 Gastro-esophageal reflux disease without esophagitis: Secondary | ICD-10-CM | POA: Diagnosis not present

## 2018-02-17 DIAGNOSIS — R739 Hyperglycemia, unspecified: Secondary | ICD-10-CM | POA: Diagnosis not present

## 2018-02-17 DIAGNOSIS — J4551 Severe persistent asthma with (acute) exacerbation: Secondary | ICD-10-CM | POA: Diagnosis not present

## 2018-02-17 LAB — COMPREHENSIVE METABOLIC PANEL
ALT: 17 U/L (ref 0–44)
AST: 17 U/L (ref 15–41)
Albumin: 3.4 g/dL — ABNORMAL LOW (ref 3.5–5.0)
Alkaline Phosphatase: 78 U/L (ref 38–126)
Anion gap: 7 (ref 5–15)
BUN: 11 mg/dL (ref 8–23)
CO2: 26 mmol/L (ref 22–32)
Calcium: 9.7 mg/dL (ref 8.9–10.3)
Chloride: 106 mmol/L (ref 98–111)
Creatinine, Ser: 0.6 mg/dL (ref 0.44–1.00)
GFR calc Af Amer: >60 mL/min (ref >60)
GFR calc non Af Amer: >60 mL/min (ref >60)
Glucose, Bld: 156 mg/dL — ABNORMAL HIGH (ref 70–99)
Potassium: 4 mmol/L (ref 3.5–5.1)
Sodium: 139 mmol/L (ref 135–145)
Total Bilirubin: 0.4 mg/dL (ref 0.3–1.2)
Total Protein: 7.1 g/dL (ref 6.5–8.1)

## 2018-02-17 LAB — CBC
HCT: 40.3 % (ref 36.0–46.0)
Hemoglobin: 12.2 g/dL (ref 12.0–15.0)
MCH: 27.9 pg (ref 26.0–34.0)
MCHC: 30.3 g/dL (ref 30.0–36.0)
MCV: 92 fL (ref 80.0–100.0)
Platelets: 263 10*3/uL (ref 150–400)
RBC: 4.38 MIL/uL (ref 3.87–5.11)
RDW: 14.6 % (ref 11.5–15.5)
WBC: 8.2 10*3/uL (ref 4.0–10.5)
nRBC: 0 % (ref 0.0–0.2)

## 2018-02-17 LAB — GLUCOSE, CAPILLARY
Glucose-Capillary: 141 mg/dL — ABNORMAL HIGH (ref 70–99)
Glucose-Capillary: 105 mg/dL — ABNORMAL HIGH (ref 70–99)
Glucose-Capillary: 177 mg/dL — ABNORMAL HIGH (ref 70–99)

## 2018-02-17 LAB — HIV ANTIBODY (ROUTINE TESTING W REFLEX): HIV Screen 4th Generation wRfx: NONREACTIVE

## 2018-02-17 MED ORDER — IPRATROPIUM-ALBUTEROL 0.5-2.5 (3) MG/3ML IN SOLN: 3.0000 mL | RESPIRATORY_TRACT | Status: DC | PRN

## 2018-02-17 MED ORDER — IPRATROPIUM-ALBUTEROL 0.5-2.5 (3) MG/3ML IN SOLN
3.0000 mL | Freq: Four times a day (QID) | RESPIRATORY_TRACT | Status: DC | PRN
  Administered 2018-02-17: 3 mL via RESPIRATORY_TRACT
  Filled 2018-02-17: qty 3

## 2018-02-17 NOTE — Progress Notes (Signed)
PROGRESS NOTE    Sonia Drake  ZOX:[id number] DOB: [birth year] DOA: 02/16/2018 PCP: Verlon Au, MD    Brief Narrative:  62 year old female who presented with dyspnea.  She does have significant past medical history for osteoarthritis of the right knee, left hip, diastolic heart failure, depression, asthma, GERD, seizures and prolonged QT.  Patient reported worsening dyspnea for last 24 hours, associated with cough, and wheezing.  On the initial physical examination temperature 98.5, heart rate 89, respiratory rate 30, blood pressure 135/100, oxygen saturation 92% on room air.  Dry mucous membranes, lungs with decreased breath sounds bilaterally with diffuse bilateral wheezing, heart S1-S2 present, rhythmic, abdomen soft nontender, no lower extremity edema  Patient was admitted to the hospital working diagnosis of acute asthma exacerbation.  Assessment & Plan:   Principal Problem:   Asthma exacerbation Active Problems:   Hyperglycemia   Chronic diastolic CHF (congestive heart failure) (HCC)   GERD (gastroesophageal reflux disease)   Seizures (HCC)   History of prolonged Q-T interval on ECG   1. Acute asthma exacerbation. Will continue supplemental 02 per [place], continue oxymetry monitoring, systemic steroids and aggressive bronchodilator therapy. Out of bed as tolerated.   2. Chronic diastolic heart failure. No signs of acute exacerbation, will continue blood pressure monitoring.  3. Seizures. Continue neuro checks per unit protocol, not on antiepileptics.  DVT prophylaxis: heparin sq  Code Status: full Family Communication: no family at the bedside  Disposition Plan/ discharge barriers: pending clinical improvement   Body mass index is 34.66 kg/m. Malnutrition Type:      Malnutrition Characteristics:      Nutrition Interventions:     RN Pressure Injury Documentation:     Consultants:     Procedures:     Antimicrobials:        Subjective: Patient has improvement in her dyspnea but not back to baseline, continue wheezing and cough, no nausea or vomiting.   Objective: Vitals:   02/17/18 0515 02/17/18 0759 02/17/18 1352 02/17/18 1545  BP: (!) 110/51  (!) 144/90   Pulse: 77  84   Resp: 18  18   Temp: 97.8 F (36.6 C)  97.6 F (36.4 C)   TempSrc: Oral  Oral   SpO2: 92% 92% 92% 94%  Weight:      Height:        Intake/Output Summary (Last 24 hours) at 02/17/2018 1630 Last data filed at 02/17/2018 0523 Gross per 24 hour  Intake 600 ml  Output 800 ml  Net -200 ml   Filed Weights   02/16/18 1152  Weight: 91.6 kg    Examination:   General: deconditioned  Neurology: Awake and alert, non focal  E ENT: mild pallor, no icterus, oral mucosa moist Cardiovascular: No JVD. S1-S2 present, rhythmic, no gallops, rubs, or murmurs. No lower extremity edema. Pulmonary: positive breath sounds bilaterally, decreased air movement, no positive expiratory wheezing, with scattered rhonchi and rales. Gastrointestinal. Abdomen with no organomegaly, non tender, no rebound or guarding Skin. No rashes Musculoskeletal: no joint deformities     Data Reviewed: I have personally reviewed following labs and imaging studies  CBC: Recent Labs  Lab 02/16/18 0938 02/17/18 0440  WBC 7.1 8.2  NEUTROABS 3.9  --   HGB 11.7* 12.2  HCT 39.6 40.3  MCV 94.7 92.0  PLT 229 263   Basic Metabolic Panel: Recent Labs  Lab 02/16/18 0938 02/17/18 0440  NA 144 139  K 4.0 4.0  CL 107 106  CO2 31 26  GLUCOSE 103* 156*  BUN 10 11  CREATININE 0.62 0.60  CALCIUM 9.3 9.7  MG 1.7  --   PHOS 3.1  --    GFR: Estimated Creatinine Clearance: 77.9 mL/min (by C-G formula based on SCr of 0.6 mg/dL). Liver Function Tests: Recent Labs  Lab 02/17/18 0440  AST 17  ALT 17  ALKPHOS 78  BILITOT 0.4  PROT 7.1  ALBUMIN 3.4*   No results for input(s): LIPASE, AMYLASE in the last 168 hours. No results for input(s): AMMONIA in the  last 168 hours. Coagulation Profile: No results for input(s): INR, PROTIME in the last 168 hours. Cardiac Enzymes: No results for input(s): CKTOTAL, CKMB, CKMBINDEX, TROPONINI in the last 168 hours. BNP (last 3 results) No results for input(s): PROBNP in the last 8760 hours. HbA1C: No results for input(s): HGBA1C in the last 72 hours. CBG: Recent Labs  Lab 02/16/18 1212 02/16/18 1647 02/16/18 2051 02/17/18 0735 02/17/18 1200  GLUCAP 97 183* 228* 141* 105*   Lipid Profile: No results for input(s): CHOL, HDL, LDLCALC, TRIG, CHOLHDL, LDLDIRECT in the last 72 hours. Thyroid Function Tests: No results for input(s): TSH, T4TOTAL, FREET4, T3FREE, THYROIDAB in the last 72 hours. Anemia Panel: No results for input(s): VITAMINB12, FOLATE, FERRITIN, TIBC, IRON, RETICCTPCT in the last 72 hours.    Radiology Studies: I have reviewed all of the imaging during this hospital visit personally     Scheduled Meds: . calcium-vitamin D  1 tablet Oral Q breakfast  . heparin  5,000 Units Subcutaneous Q8H  . ipratropium-albuterol  3 mL Nebulization Q6H  . montelukast  10 mg Oral QHS  . [START ON 02/18/2018] predniSONE  40 mg Oral Q breakfast   Continuous Infusions:   LOS: 0 days        Hawken Bielby Annett Gula, MD [facility] Pager [phone]

## 2018-02-17 NOTE — Progress Notes (Signed)
PROGRESS NOTE    Sonia Drake  ZOX:[id number] DOB: 12-May-1955 DOA: 02/16/2018 PCP: Verlon Au, MD  [facility]  Brief Narrative:  Sonia Drake is 62 y.o patient was admitted for asthma exacerbation. Has a history of significant osteoarthritis of the right knee, left hip, grade 2 diastolic dysfunction with EF of 60-65%, depression, arrhythmia, history of prolonged QT, GERD, seizures, pneumonia. Patient presented to the ED yesterday for progressively worse shortness of breath and dry cough. Chest x-ray done in the ED came out normal. Has been given with breathing treatment and oxygen supplement.   Assessment & Plan:   Principal Problem:   Asthma exacerbation Active Problems:   Hyperglycemia   Chronic diastolic CHF (congestive heart failure) (HCC)   GERD (gastroesophageal reflux disease)   Seizures (HCC)   History of prolonged Q-T interval on ECG  1. Asthma exacerbation: Secondary to chronic condition and environmental allergy reaction. Patient still has some difficulty breathing and still wheezing. However, breathing and talking ability is getting back to normal. Continue on prednisone 10 mg daily for 4 days and Douneb treatment every 6 hrs PRN.   2. Hyperglycemia: Controlled. On no medication. Monitor blood sugar closely as patient is on steroids.   3. Chronic diastolic CHF: Patient do not have ascites of the abdomen or leg edema. Continue observing.   4. GERD: No current symptoms. Continue on Protonix 20 mg PO daily.   5. Seizures: Mentioned on chart. No seizure episodes recently. But, on no medication.   6. History of prolonged QT interval on ECG: History of tachyarrhythmia. EKG is currently normal.    DVT prophylaxis: Lovenox   Code Status:  Full Code  Family Communication:  No family was present at bedside.   Disposition Plan: Patient will be discharged as her breathing came back to baseline.    Consultants:  None  Procedures:   None  Antimicrobials:  None  Subjective: Patient indicated that her breathing is much better than yesterday, but not at baseline. Also, she can talk better than yesterday. Had exacerbation episode early this morning. However, it has resolved after breathing treatment. Denied chest pain.    Objective: Vitals:   02/16/18 2017 02/16/18 2054 02/17/18 0515 02/17/18 0759  BP:  130/67 (!) 110/51   Pulse:  82 77   Resp:  20 18   Temp:  98.1 F (36.7 C) 97.8 F (36.6 C)   TempSrc:  Oral Oral   SpO2: 95% 95% 92% 92%  Weight:      Height:        Intake/Output Summary (Last 24 hours) at 02/17/2018 1308 Last data filed at 02/17/2018 0523 Gross per 24 hour  Intake 600 ml  Output 800 ml  Net -200 ml   Filed Weights   02/16/18 1152  Weight: 91.6 kg    Examination:  General exam: Appears acuetly ill Respiratory system: Has wheezing bilaterally. Some respiratory effort.  Cardiovascular system: S1 & S2 heard, RRR. No JVD, murmurs, rubs, gallops or clicks. No pedal edema. Gastrointestinal system: Abdomen is nondistended, soft and nontender. No organomegaly or masses felt. Normal bowel sounds heard. Central nervous system: Alert and oriented. No focal neurological deficits. Extremities: Symmetric 5 x 5 power. Skin: No rashes, lesions or ulcers Psychiatry: Judgement and insight appear normal. Mood & affect appropriate.     Data Reviewed: I have personally reviewed following labs and imaging studies  CBC: Recent Labs  Lab 02/16/18 0938 02/17/18 0440  WBC 7.1 8.2  NEUTROABS 3.9  --  HGB 11.7* 12.2  HCT 39.6 40.3  MCV 94.7 92.0  PLT 229 263   Basic Metabolic Panel: Recent Labs  Lab 02/16/18 0938 02/17/18 0440  NA 144 139  K 4.0 4.0  CL 107 106  CO2 31 26  GLUCOSE 103* 156*  BUN 10 11  CREATININE 0.62 0.60  CALCIUM 9.3 9.7  MG 1.7  --   PHOS 3.1  --    GFR: Estimated Creatinine Clearance: 77.9 mL/min (by C-G formula based on SCr of 0.6 mg/dL). Liver Function  Tests: Recent Labs  Lab 02/17/18 0440  AST 17  ALT 17  ALKPHOS 78  BILITOT 0.4  PROT 7.1  ALBUMIN 3.4*   No results for input(s): LIPASE, AMYLASE in the last 168 hours. No results for input(s): AMMONIA in the last 168 hours. Coagulation Profile: No results for input(s): INR, PROTIME in the last 168 hours. Cardiac Enzymes: No results for input(s): CKTOTAL, CKMB, CKMBINDEX, TROPONINI in the last 168 hours. BNP (last 3 results) No results for input(s): PROBNP in the last 8760 hours. HbA1C: No results for input(s): HGBA1C in the last 72 hours. CBG: Recent Labs  Lab 02/16/18 1212 02/16/18 1647 02/16/18 2051 02/17/18 0735 02/17/18 1200  GLUCAP 97 183* 228* 141* 105*   Lipid Profile: No results for input(s): CHOL, HDL, LDLCALC, TRIG, CHOLHDL, LDLDIRECT in the last 72 hours. Thyroid Function Tests: No results for input(s): TSH, T4TOTAL, FREET4, T3FREE, THYROIDAB in the last 72 hours. Anemia Panel: No results for input(s): VITAMINB12, FOLATE, FERRITIN, TIBC, IRON, RETICCTPCT in the last 72 hours. Urine analysis:    Component Value Date/Time   COLORURINE YELLOW 03/31/2017 1500   APPEARANCEUR CLEAR 03/31/2017 1500   LABSPEC 1.016 03/31/2017 1500   PHURINE 7.0 03/31/2017 1500   GLUCOSEU NEGATIVE 03/31/2017 1500   HGBUR NEGATIVE 03/31/2017 1500   BILIRUBINUR NEGATIVE 03/31/2017 1500   KETONESUR NEGATIVE 03/31/2017 1500   PROTEINUR NEGATIVE 03/31/2017 1500   UROBILINOGEN 0.2 07/03/2014 1456   NITRITE NEGATIVE 03/31/2017 1500   LEUKOCYTESUR NEGATIVE 03/31/2017 1500   Sepsis Labs: @LABRCNTIP (procalcitonin:4,lacticidven:4)  )No results found for this or any previous visit (from the past 240 hour(s)).       Radiology Studies: Dg Chest Portable 1 View  Result Date: 02/16/2018 CLINICAL DATA:  Shortness of breath EXAM: PORTABLE CHEST 1 VIEW COMPARISON:  11/02/2017 FINDINGS: Cardiac shadows within normal limits. The lungs are well aerated bilaterally. No focal infiltrate  or sizable effusion is seen. No acute bony abnormality is noted. IMPRESSION: No active disease. Electronically Signed   By: Alcide Clever M.D.   On: 02/16/2018 10:09        Scheduled Meds: . calcium-vitamin D  1 tablet Oral Q breakfast  . heparin  5,000 Units Subcutaneous Q8H  . ipratropium-albuterol  3 mL Nebulization Q6H  . montelukast  10 mg Oral QHS  . [START ON 02/18/2018] predniSONE  40 mg Oral Q breakfast   Continuous Infusions:   LOS: 0 days    Time spent:    Layne Benton, MD [facility] Pager [phone]  If 7PM-7AM, please contact night-coverage [email or web address] Password [email or web address] 02/17/2018, 1:08 PM

## 2018-02-17 NOTE — Plan of Care (Signed)
  Problem: Clinical Measurements: Goal: Ability to maintain clinical measurements within normal limits will improve Outcome: Progressing   Problem: Activity: Goal: Risk for activity intolerance will decrease Outcome: Progressing   

## 2018-02-17 NOTE — Progress Notes (Signed)
Patient ambulated 360 ft. Activity tolerated well. Oxygen Saturation sustained assessed 94%. Will continue to monitor

## 2018-02-17 NOTE — Progress Notes (Signed)
Nutrition Brief Note  Patient identified on the Malnutrition Screening Tool (MST) Report  Wt Readings from Last 15 Encounters:  02/16/18 91.6 kg  11/11/17 94.1 kg  11/04/17 95.2 kg  09/19/17 92.5 kg  04/07/17 93 kg  03/07/17 93.9 kg  02/17/17 94.1 kg  02/04/16 89.4 kg  07/21/15 96.8 kg  04/22/15 94 kg  07/02/14 93.8 kg  06/29/14 92.6 kg   Patient with PMH significant for osteoarthritis of the right knee, left hip, CHF, depression, GERD, remote seizures, multiple episodes of pneumonia, and asthma Presents this admission with complaints of progressively worse shortness of breath. Admitted for asthma exacerbation. Pt denies having a loss in appetite PTA. 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 She endorses eating a more balanced diet and this has resulted in expected weight loss. She expressed grievances regarding hospital kitchen services. RD listened and answered all questions.     Body mass index is 34.66 kg/m. Patient meets criteria for obese based on current BMI.   Current diet order is heart healthy/carb modified, patient is consuming approximately 100% of meals at this time. Labs and medications reviewed.   No nutrition interventions warranted at this time. If nutrition issues arise, please consult RD.   Vanessa Kick RD, LDN Clinical Nutrition Pager # [phone]

## 2018-02-18 DIAGNOSIS — I5032 Chronic diastolic (congestive) heart failure: Secondary | ICD-10-CM | POA: Diagnosis not present

## 2018-02-18 DIAGNOSIS — J4551 Severe persistent asthma with (acute) exacerbation: Secondary | ICD-10-CM | POA: Diagnosis not present

## 2018-02-18 DIAGNOSIS — K219 Gastro-esophageal reflux disease without esophagitis: Secondary | ICD-10-CM | POA: Diagnosis not present

## 2018-02-18 DIAGNOSIS — Z87898 Personal history of other specified conditions: Secondary | ICD-10-CM

## 2018-02-18 LAB — CBC WITH DIFFERENTIAL/PLATELET
Abs Immature Granulocytes: 0.09 10*3/uL — ABNORMAL HIGH (ref 0.00–0.07)
Basophils Absolute: 0 10*3/uL (ref 0.0–0.1)
Basophils Relative: 0 %
Eosinophils Absolute: 0.1 10*3/uL (ref 0.0–0.5)
Eosinophils Relative: 1 %
HCT: 38 % (ref 36.0–46.0)
Hemoglobin: 11.4 g/dL — ABNORMAL LOW (ref 12.0–15.0)
Immature Granulocytes: 1 %
Lymphocytes Relative: 25 %
Lymphs Abs: 3.3 10*3/uL (ref 0.7–4.0)
MCH: 27.7 pg (ref 26.0–34.0)
MCHC: 30 g/dL (ref 30.0–36.0)
MCV: 92.5 fL (ref 80.0–100.0)
Monocytes Absolute: 0.9 10*3/uL (ref 0.1–1.0)
Monocytes Relative: 7 %
Neutro Abs: 8.7 10*3/uL — ABNORMAL HIGH (ref 1.7–7.7)
Neutrophils Relative %: 66 %
Platelets: 237 10*3/uL (ref 150–400)
RBC: 4.11 MIL/uL (ref 3.87–5.11)
RDW: 15 % (ref 11.5–15.5)
WBC: 13.1 10*3/uL — ABNORMAL HIGH (ref 4.0–10.5)
nRBC: 0 % (ref 0.0–0.2)

## 2018-02-18 LAB — BASIC METABOLIC PANEL
Anion gap: 3 — ABNORMAL LOW (ref 5–15)
BUN: 11 mg/dL (ref 8–23)
CO2: 31 mmol/L (ref 22–32)
Calcium: 9.5 mg/dL (ref 8.9–10.3)
Chloride: 104 mmol/L (ref 98–111)
Creatinine, Ser: 0.65 mg/dL (ref 0.44–1.00)
GFR calc Af Amer: >60 mL/min (ref >60)
GFR calc non Af Amer: >60 mL/min (ref >60)
Glucose, Bld: 112 mg/dL — ABNORMAL HIGH (ref 70–99)
Potassium: 3.4 mmol/L — ABNORMAL LOW (ref 3.5–5.1)
Sodium: 138 mmol/L (ref 135–145)

## 2018-02-18 MED ORDER — PREDNISONE 20 MG PO TABS: 40.0000 mg | ORAL_TABLET | Freq: Every day | ORAL | 0 refills | Status: AC

## 2018-02-18 NOTE — Discharge Summary (Addendum)
Physician Discharge Summary  Sonia Drake HQI:[id number] DOB: 07-28-1955 DOA: 02/16/2018  PCP: Verlon Au, MD  Admit date: 02/16/2018 Discharge date: 02/18/2018  Admitted From: Home  Disposition:  Home   Recommendations for Outpatient Follow-up and new medication changes:  1. Follow up with Dr. Leavy Cella in 7 days.  2. Continue prednisone 40 mg for 3 more days 3. Continue bronchodilator therapy at home.   Home Health: no   Equipment/Devices: no    Discharge Condition: stable  CODE STATUS: full  Diet recommendation: Regular.   Brief/Interim Summary: 62 year old female who presented with dyspnea.  She does have the significant past medical history for osteoarthritis of the right knee and left hip, diastolic heart failure, depression, asthma, GERD, seizures and prolonged QT.  Patient reported worsening dyspnea for last 24 hours, associated with cough, and wheezing.  On the initial physical examination temperature 98.5, heart rate 89, respiratory rate 30, blood pressure 135/100, oxygen saturation 92% on room air.  Dry mucous membranes, lungs with decreased breath sounds bilaterally with diffuse bilateral wheezing, heart S1-S2 present, rhythmic, abdomen soft nontender, no lower extremity edema.  Sodium 144, potassium 4.0, chloride 107, bicarb 31, glucose 103, BUN 10, creatinine 0.62, white count 7.1, hemoglobin 11.7, hematocrit 39.6, platelets 229.  Chest x-ray with mild hyperinflation, no infiltrates. EKG sinus with normal axis, inferior t wave inversions, corrected QTC 422.   Patient was admitted to the hospital with the working diagnosis of acute asthma exacerbation.  1.  Acute asthma exacerbation.  Patient was admitted to the medical ward, she received aggressive bronchodilator therapy along with systemic steroids, oximetry monitoring and supplemental oxygen per nasal cannula.  He responded well to medical therapy, at discharge her oxygen saturation 93% on room air, no wheezing on  pulmonary auscultation.  2.  Chronic diastolic heart failure.  No signs of acute exacerbation, continue blood pressure monitoring as an outpatient.  3.  History of seizures.  Patient currently not on antiepileptics, follow-up as an outpatient.  4. GERD. Continue pantoprazole.   Discharge Diagnoses:  Principal Problem:   Asthma exacerbation Active Problems:   Hyperglycemia   Chronic diastolic CHF (congestive heart failure) (HCC)   GERD (gastroesophageal reflux disease)   Seizures (HCC)   History of prolonged Q-T interval on ECG    Discharge Instructions  Discharge Instructions    Diet - low sodium heart healthy   Complete by:  As directed    Discharge instructions   Complete by:  As directed    Please follow up with primary care in 7 days.   Increase activity slowly   Complete by:  As directed      Allergies as of 02/18/2018      Reactions   Contrast Media [iodinated Diagnostic Agents] Anaphylaxis, Other (See Comments)   ** CARDIAC ARREST **   Peanuts [peanut Oil] Shortness Of Breath, Swelling   Shellfish Allergy Shortness Of Breath, Swelling   Soap Shortness Of Breath, Swelling   LAUNDRY DETERGENT   Latex Hives   Eggs Or Egg-derived Products Rash      Medication List    TAKE these medications   albuterol (2.5 MG/3ML) 0.083% nebulizer solution Commonly known as:  PROVENTIL Take 2.5 mg by nebulization every 6 (six) hours as needed for wheezing or shortness of breath.   albuterol 108 (90 Base) MCG/ACT inhaler Commonly known as:  PROVENTIL HFA;VENTOLIN HFA Inhale 1-2 puffs into the lungs every 6 (six) hours as needed for wheezing or shortness of breath.   budesonide-formoterol  160-4.5 MCG/ACT inhaler Commonly known as:  SYMBICORT Inhale 2 puffs into the lungs 2 (two) times daily.   calcium-vitamin D 500-200 MG-UNIT tablet Commonly known as:  OSCAL WITH D Take 1 tablet by mouth daily with breakfast.   montelukast 10 MG tablet Commonly known as:   SINGULAIR Take 10 mg by mouth daily.   pantoprazole 20 MG tablet Commonly known as:  PROTONIX Take 20 mg by mouth daily as needed for indigestion.   predniSONE 20 MG tablet Commonly known as:  DELTASONE Take 2 tablets (40 mg total) by mouth daily with breakfast for 3 days. Start taking on:  02/19/2018   tiotropium 18 MCG inhalation capsule Commonly known as:  SPIRIVA Place 18 mcg into inhaler and inhale daily.   triamcinolone lotion 0.1 % Commonly known as:  KENALOG Apply 1 application topically 2 (two) times daily as needed (for rash).       Allergies  Allergen Reactions  . Contrast Media [Iodinated Diagnostic Agents] Anaphylaxis and Other (See Comments)    ** CARDIAC ARREST **  . Peanuts [Peanut Oil] Shortness Of Breath and Swelling  . Shellfish Allergy Shortness Of Breath and Swelling  . Soap Shortness Of Breath and Swelling    LAUNDRY DETERGENT  . Latex Hives  . Eggs Or Egg-Derived Products Rash    Consultations:     Procedures/Studies: Dg Chest Portable 1 View  Result Date: 02/16/2018 CLINICAL DATA:  Shortness of breath EXAM: PORTABLE CHEST 1 VIEW COMPARISON:  11/02/2017 FINDINGS: Cardiac shadows within normal limits. The lungs are well aerated bilaterally. No focal infiltrate or sizable effusion is seen. No acute bony abnormality is noted. IMPRESSION: No active disease. Electronically Signed   By: Alcide Clever M.D.   On: 02/16/2018 10:09       Subjective: Patient is feeling better, dyspnea has improved, no coughing.  Discharge Exam: Vitals:   02/18/18 0444 02/18/18 0825  BP: (!) 132/54   Pulse: 81   Resp: 20   Temp: 98.3 F (36.8 C)   SpO2: 96% 93%   Vitals:   02/17/18 2028 02/17/18 2217 02/18/18 0444 02/18/18 0825  BP:  138/67 (!) 132/54   Pulse:  85 81   Resp:  18 20   Temp:  98.3 F (36.8 C) 98.3 F (36.8 C)   TempSrc:  Oral Oral   SpO2: 98% 96% 96% 93%  Weight:      Height:        General: Not in pain or dyspnea Neurology: Awake  and alert, non focal  E ENT: no pallor, no icterus, oral mucosa moist Cardiovascular: No JVD. S1-S2 present, rhythmic, no gallops, rubs, or murmurs. No lower extremity edema. Pulmonary: vesicular breath sounds bilaterally, adequate air movement, no wheezing, rhonchi or rales. Gastrointestinal. Abdomen with no organomegaly, non tender, no rebound or guarding Skin. No rashes Musculoskeletal: no joint deformities   The results of significant diagnostics from this hospitalization (including imaging, microbiology, ancillary and laboratory) are listed below for reference.     Microbiology: No results found for this or any previous visit (from the past 240 hour(s)).   Labs: BNP (last 3 results) Recent Labs    10/14/17 2150 11/02/17 2154 02/16/18 0938  BNP 27.5 24.6 20.0   Basic Metabolic Panel: Recent Labs  Lab 02/16/18 0938 02/17/18 0440 02/18/18 0442  NA 144 139 138  K 4.0 4.0 3.4*  CL 107 106 104  CO2 31 26 31   GLUCOSE 103* 156* 112*  BUN 10 11 11   CREATININE  0.62 0.60 0.65  CALCIUM 9.3 9.7 9.5  MG 1.7  --   --   PHOS 3.1  --   --    Liver Function Tests: Recent Labs  Lab 02/17/18 0440  AST 17  ALT 17  ALKPHOS 78  BILITOT 0.4  PROT 7.1  ALBUMIN 3.4*   No results for input(s): LIPASE, AMYLASE in the last 168 hours. No results for input(s): AMMONIA in the last 168 hours. CBC: Recent Labs  Lab 02/16/18 0938 02/17/18 0440 02/18/18 0442  WBC 7.1 8.2 13.1*  NEUTROABS 3.9  --  8.7*  HGB 11.7* 12.2 11.4*  HCT 39.6 40.3 38.0  MCV 94.7 92.0 92.5  PLT 229 263 237   Cardiac Enzymes: No results for input(s): CKTOTAL, CKMB, CKMBINDEX, TROPONINI in the last 168 hours. BNP: Invalid input(s): POCBNP CBG: Recent Labs  Lab 02/16/18 1647 02/16/18 2051 02/17/18 0735 02/17/18 1200 02/17/18 1706  GLUCAP 183* 228* 141* 105* 177*   D-Dimer No results for input(s): DDIMER in the last 72 hours. Hgb A1c No results for input(s): HGBA1C in the last 72 hours. Lipid  Profile No results for input(s): CHOL, HDL, LDLCALC, TRIG, CHOLHDL, LDLDIRECT in the last 72 hours. Thyroid function studies No results for input(s): TSH, T4TOTAL, T3FREE, THYROIDAB in the last 72 hours.  Invalid input(s): FREET3 Anemia work up No results for input(s): VITAMINB12, FOLATE, FERRITIN, TIBC, IRON, RETICCTPCT in the last 72 hours. Urinalysis    Component Value Date/Time   COLORURINE YELLOW 03/31/2017 1500   APPEARANCEUR CLEAR 03/31/2017 1500   LABSPEC 1.016 03/31/2017 1500   PHURINE 7.0 03/31/2017 1500   GLUCOSEU NEGATIVE 03/31/2017 1500   HGBUR NEGATIVE 03/31/2017 1500   BILIRUBINUR NEGATIVE 03/31/2017 1500   KETONESUR NEGATIVE 03/31/2017 1500   PROTEINUR NEGATIVE 03/31/2017 1500   UROBILINOGEN 0.2 07/03/2014 1456   NITRITE NEGATIVE 03/31/2017 1500   LEUKOCYTESUR NEGATIVE 03/31/2017 1500   Sepsis Labs Invalid input(s): PROCALCITONIN,  WBC,  LACTICIDVEN Microbiology No results found for this or any previous visit (from the past 240 hour(s)).   Time coordinating discharge: 45 minutes  SIGNED:   Coralie Keens, MD  [facility] 02/18/2018, 11:51 AM Pager [phone]  If 7PM-7AM, please contact night-coverage [email or web address] Password [email or web address]

## 2018-02-20 ENCOUNTER — Telehealth: Payer: Self-pay | Admitting: Obstetrics and Gynecology

## 2018-02-20 ENCOUNTER — Other Ambulatory Visit: Payer: Self-pay | Admitting: Obstetrics and Gynecology

## 2018-02-20 MED ORDER — BUDESONIDE-FORMOTEROL FUMARATE 160-4.5 MCG/ACT IN AERO: 2.0000 | INHALATION_SPRAY | Freq: Two times a day (BID) | RESPIRATORY_TRACT | 1 refills | Status: DC

## 2018-02-20 MED ORDER — ALBUTEROL SULFATE (2.5 MG/3ML) 0.083% IN NEBU: 2.5000 mg | INHALATION_SOLUTION | Freq: Four times a day (QID) | RESPIRATORY_TRACT | 1 refills | Status: DC | PRN

## 2018-02-20 MED ORDER — ALBUTEROL SULFATE HFA 108 (90 BASE) MCG/ACT IN AERS: 1.0000 | INHALATION_SPRAY | Freq: Four times a day (QID) | RESPIRATORY_TRACT | 3 refills | Status: DC | PRN

## 2018-02-20 MED ORDER — TIOTROPIUM BROMIDE MONOHYDRATE 18 MCG IN CAPS: 18.0000 ug | ORAL_CAPSULE | Freq: Every day | RESPIRATORY_TRACT | 1 refills | Status: DC

## 2018-02-20 NOTE — Telephone Encounter (Signed)
Call from discharge nurse, pt out of inhalers, no pcp to refill. Will rx inhalers listed to continue at pt's discharge.

## 2018-04-10 ENCOUNTER — Inpatient Hospital Stay (HOSPITAL_COMMUNITY)
Admission: EM | Admit: 2018-04-10 | Discharge: 2018-04-14 | DRG: 202 | Disposition: A | Discharge status: Home / Self Care | Payer: Medicaid Other | Attending: Family Medicine | Admitting: Family Medicine

## 2018-04-10 ENCOUNTER — Other Ambulatory Visit: Payer: Self-pay

## 2018-04-10 ENCOUNTER — Emergency Department (HOSPITAL_COMMUNITY): Payer: Medicaid Other

## 2018-04-10 ENCOUNTER — Encounter (HOSPITAL_COMMUNITY): Payer: Self-pay | Admitting: *Deleted

## 2018-04-10 DIAGNOSIS — Z91040 Latex allergy status: Secondary | ICD-10-CM

## 2018-04-10 DIAGNOSIS — Z9981 Dependence on supplemental oxygen: Secondary | ICD-10-CM

## 2018-04-10 DIAGNOSIS — E876 Hypokalemia: Secondary | ICD-10-CM | POA: Diagnosis present

## 2018-04-10 DIAGNOSIS — J45901 Unspecified asthma with (acute) exacerbation: Secondary | ICD-10-CM | POA: Diagnosis not present

## 2018-04-10 DIAGNOSIS — Z7951 Long term (current) use of inhaled steroids: Secondary | ICD-10-CM

## 2018-04-10 DIAGNOSIS — I5032 Chronic diastolic (congestive) heart failure: Secondary | ICD-10-CM | POA: Diagnosis present

## 2018-04-10 DIAGNOSIS — Z91041 Radiographic dye allergy status: Secondary | ICD-10-CM

## 2018-04-10 DIAGNOSIS — Z96642 Presence of left artificial hip joint: Secondary | ICD-10-CM | POA: Diagnosis present

## 2018-04-10 DIAGNOSIS — Z8249 Family history of ischemic heart disease and other diseases of the circulatory system: Secondary | ICD-10-CM

## 2018-04-10 DIAGNOSIS — Z825 Family history of asthma and other chronic lower respiratory diseases: Secondary | ICD-10-CM

## 2018-04-10 DIAGNOSIS — Z833 Family history of diabetes mellitus: Secondary | ICD-10-CM

## 2018-04-10 DIAGNOSIS — J4551 Severe persistent asthma with (acute) exacerbation: Principal | ICD-10-CM | POA: Diagnosis present

## 2018-04-10 DIAGNOSIS — Z8701 Personal history of pneumonia (recurrent): Secondary | ICD-10-CM

## 2018-04-10 DIAGNOSIS — K219 Gastro-esophageal reflux disease without esophagitis: Secondary | ICD-10-CM | POA: Diagnosis present

## 2018-04-10 DIAGNOSIS — J441 Chronic obstructive pulmonary disease with (acute) exacerbation: Secondary | ICD-10-CM | POA: Diagnosis present

## 2018-04-10 DIAGNOSIS — J988 Other specified respiratory disorders: Secondary | ICD-10-CM | POA: Diagnosis present

## 2018-04-10 DIAGNOSIS — Z79899 Other long term (current) drug therapy: Secondary | ICD-10-CM

## 2018-04-10 LAB — BASIC METABOLIC PANEL
Anion gap: 8 (ref 5–15)
BUN: 8 mg/dL (ref 8–23)
CO2: 30 mmol/L (ref 22–32)
Calcium: 9.4 mg/dL (ref 8.9–10.3)
Chloride: 103 mmol/L (ref 98–111)
Creatinine, Ser: 0.61 mg/dL (ref 0.44–1.00)
GFR calc Af Amer: >60 mL/min (ref >60)
GFR calc non Af Amer: >60 mL/min (ref >60)
Glucose, Bld: 111 mg/dL — ABNORMAL HIGH (ref 70–99)
Potassium: 3.4 mmol/L — ABNORMAL LOW (ref 3.5–5.1)
Sodium: 141 mmol/L (ref 135–145)

## 2018-04-10 LAB — CBC WITH DIFFERENTIAL/PLATELET
Abs Immature Granulocytes: 0.01 10*3/uL (ref 0.00–0.07)
Basophils Absolute: 0 10*3/uL (ref 0.0–0.1)
Basophils Relative: 1 %
Eosinophils Absolute: 0.6 10*3/uL — ABNORMAL HIGH (ref 0.0–0.5)
Eosinophils Relative: 11 %
HCT: 41.4 % (ref 36.0–46.0)
Hemoglobin: 12.3 g/dL (ref 12.0–15.0)
Immature Granulocytes: 0 %
Lymphocytes Relative: 36 %
Lymphs Abs: 2.1 10*3/uL (ref 0.7–4.0)
MCH: 28.5 pg (ref 26.0–34.0)
MCHC: 29.7 g/dL — ABNORMAL LOW (ref 30.0–36.0)
MCV: 96.1 fL (ref 80.0–100.0)
Monocytes Absolute: 0.6 10*3/uL (ref 0.1–1.0)
Monocytes Relative: 9 %
Neutro Abs: 2.6 10*3/uL (ref 1.7–7.7)
Neutrophils Relative %: 43 %
Platelets: 222 10*3/uL (ref 150–400)
RBC: 4.31 MIL/uL (ref 3.87–5.11)
RDW: 14.8 % (ref 11.5–15.5)
WBC: 5.9 10*3/uL (ref 4.0–10.5)
nRBC: 0 % (ref 0.0–0.2)

## 2018-04-10 LAB — RESPIRATORY PANEL BY PCR
Adenovirus: NOT DETECTED
Bordetella pertussis: NOT DETECTED
Chlamydophila pneumoniae: NOT DETECTED
Coronavirus 229E: NOT DETECTED
Coronavirus HKU1: DETECTED — AB
Coronavirus NL63: NOT DETECTED
Coronavirus OC43: NOT DETECTED
Influenza A: NOT DETECTED
Influenza B: NOT DETECTED
Metapneumovirus: NOT DETECTED
Mycoplasma pneumoniae: NOT DETECTED
Parainfluenza Virus 1: NOT DETECTED
Parainfluenza Virus 2: NOT DETECTED
Parainfluenza Virus 3: NOT DETECTED
Parainfluenza Virus 4: NOT DETECTED
Respiratory Syncytial Virus: NOT DETECTED
Rhinovirus / Enterovirus: NOT DETECTED

## 2018-04-10 MED ORDER — METHYLPREDNISOLONE SODIUM SUCC 125 MG IJ SOLR
60.0000 mg | Freq: Three times a day (TID) | INTRAMUSCULAR | Status: AC
  Administered 2018-04-10 (×2): 60 mg via INTRAVENOUS
  Filled 2018-04-10 (×2): qty 2

## 2018-04-10 MED ORDER — ALBUTEROL (5 MG/ML) CONTINUOUS INHALATION SOLN
10.0000 mg/h | INHALATION_SOLUTION | Freq: Once | RESPIRATORY_TRACT | Status: AC
  Administered 2018-04-10: 10 mg/h via RESPIRATORY_TRACT
  Filled 2018-04-10: qty 20

## 2018-04-10 MED ORDER — SODIUM CHLORIDE 0.9 % IV SOLN
500.0000 mg | Freq: Every day | INTRAVENOUS | Status: DC
  Administered 2018-04-10 – 2018-04-12 (×3): 500 mg via INTRAVENOUS
  Filled 2018-04-10 (×3): qty 500

## 2018-04-10 MED ORDER — IPRATROPIUM-ALBUTEROL 0.5-2.5 (3) MG/3ML IN SOLN
3.0000 mL | Freq: Four times a day (QID) | RESPIRATORY_TRACT | Status: DC
  Administered 2018-04-10 – 2018-04-13 (×13): 3 mL via RESPIRATORY_TRACT
  Filled 2018-04-10 (×13): qty 3

## 2018-04-10 MED ORDER — ARFORMOTEROL TARTRATE 15 MCG/2ML IN NEBU
15.0000 ug | INHALATION_SOLUTION | Freq: Two times a day (BID) | RESPIRATORY_TRACT | Status: DC
  Administered 2018-04-10 – 2018-04-14 (×9): 15 ug via RESPIRATORY_TRACT
  Filled 2018-04-10 (×13): qty 2

## 2018-04-10 MED ORDER — ALBUTEROL SULFATE (2.5 MG/3ML) 0.083% IN NEBU
5.0000 mg | INHALATION_SOLUTION | Freq: Once | RESPIRATORY_TRACT | Status: AC
  Administered 2018-04-10: 5 mg via RESPIRATORY_TRACT
  Filled 2018-04-10: qty 6

## 2018-04-10 MED ORDER — MONTELUKAST SODIUM 10 MG PO TABS
10.0000 mg | ORAL_TABLET | Freq: Every day | ORAL | Status: DC
  Administered 2018-04-10 – 2018-04-14 (×5): 10 mg via ORAL
  Filled 2018-04-10 (×5): qty 1

## 2018-04-10 MED ORDER — MAGNESIUM SULFATE 50 % IJ SOLN: 2.0000 g | Freq: Once | INTRAMUSCULAR | Status: DC

## 2018-04-10 MED ORDER — IPRATROPIUM-ALBUTEROL 0.5-2.5 (3) MG/3ML IN SOLN
3.0000 mL | RESPIRATORY_TRACT | Status: AC
  Administered 2018-04-10: 3 mL via RESPIRATORY_TRACT
  Filled 2018-04-10: qty 3

## 2018-04-10 MED ORDER — METHYLPREDNISOLONE SODIUM SUCC 125 MG IJ SOLR
125.0000 mg | Freq: Once | INTRAMUSCULAR | Status: AC
  Administered 2018-04-10: 125 mg via INTRAVENOUS
  Filled 2018-04-10: qty 2

## 2018-04-10 MED ORDER — BUDESONIDE 0.5 MG/2ML IN SUSP
0.5000 mg | Freq: Two times a day (BID) | RESPIRATORY_TRACT | Status: DC
  Administered 2018-04-10 – 2018-04-14 (×9): 0.5 mg via RESPIRATORY_TRACT
  Filled 2018-04-10 (×10): qty 2

## 2018-04-10 MED ORDER — IPRATROPIUM-ALBUTEROL 0.5-2.5 (3) MG/3ML IN SOLN
3.0000 mL | RESPIRATORY_TRACT | Status: DC | PRN
  Administered 2018-04-11 – 2018-04-12 (×2): 3 mL via RESPIRATORY_TRACT
  Filled 2018-04-10 (×2): qty 3

## 2018-04-10 MED ORDER — METHYLPREDNISOLONE SODIUM SUCC 125 MG IJ SOLR: 60.0000 mg | Freq: Three times a day (TID) | INTRAMUSCULAR | Status: DC

## 2018-04-10 MED ORDER — ACETAMINOPHEN 325 MG PO TABS: 650.0000 mg | ORAL_TABLET | Freq: Four times a day (QID) | ORAL | Status: DC | PRN

## 2018-04-10 MED ORDER — GUAIFENESIN ER 600 MG PO TB12
600.0000 mg | ORAL_TABLET | Freq: Two times a day (BID) | ORAL | Status: DC
  Administered 2018-04-10 – 2018-04-14 (×9): 600 mg via ORAL
  Filled 2018-04-10 (×10): qty 1

## 2018-04-10 MED ORDER — POTASSIUM CHLORIDE CRYS ER 20 MEQ PO TBCR
40.0000 meq | EXTENDED_RELEASE_TABLET | ORAL | Status: AC
  Administered 2018-04-10: 40 meq via ORAL
  Filled 2018-04-10: qty 2

## 2018-04-10 MED ORDER — ACETAMINOPHEN 650 MG RE SUPP: 650.0000 mg | Freq: Four times a day (QID) | RECTAL | Status: DC | PRN

## 2018-04-10 MED ORDER — MAGNESIUM SULFATE 2 GM/50ML IV SOLN
2.0000 g | Freq: Once | INTRAVENOUS | Status: AC
  Administered 2018-04-10: 2 g via INTRAVENOUS
  Filled 2018-04-10: qty 50

## 2018-04-10 MED ORDER — ENOXAPARIN SODIUM 40 MG/0.4ML ~~LOC~~ SOLN
40.0000 mg | SUBCUTANEOUS | Status: DC
  Administered 2018-04-10 – 2018-04-12 (×2): 40 mg via SUBCUTANEOUS
  Filled 2018-04-10 (×4): qty 0.4

## 2018-04-10 NOTE — ED Notes (Signed)
ADMITTING Provider at bedside. 

## 2018-04-10 NOTE — ED Notes (Signed)
Report given to Ashley, RN. Care transferred at this time.

## 2018-04-10 NOTE — ED Provider Notes (Signed)
[facility]-EMERGENCY DEPT Provider Note   CSN: [id number] Arrival date & time: 04/10/18  0120     History   Chief Complaint Chief Complaint  Patient presents with  . Asthma    HPI Sonia Drake is a 62 y.o. female presenting for evaluation of shortness of breath and wheezing.  Patient states yesterday, she started to have increased shortness of breath.  Symptoms worsened tonight.  She has been using her albuterol inhaler without improvement of symptoms.  She denies associated fevers, chills, sore throat, chest pain, nausea, vomiting, abdominal pain.  She does have a nonproductive cough.  Patient reports a history of asthma, denies a history of COPD.  Patient states she has been admitted multiple times for her asthma, had to be intubated in 2003 for asthma.  She states other than asthma, she has no other medical problems.  Takes no other medications daily.  Additional history obtained from chart review.  Patient with a documented history of CHF and COPD.  Patient states she wears 2 L O2 via nasal cannula at night.  HPI  Past Medical History:  Diagnosis Date  . Arthritis    "right knee; left hip" (04/07/2017)  . Asthma   . CHF (congestive heart failure) (HCC)    reported treatment for fluid overload in the setting of asthma exacerbation ~ 1998  . COPD (chronic obstructive pulmonary disease) (HCC)    patient states she does not have COPD but was treated for it.   . 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 Dyspnea   . Dysrhythmia    occasional skipped beats  . GERD (gastroesophageal reflux disease)   . On home oxygen therapy    "I sleep with 2L" (04/07/2017)  . Pneumonia    "several times" (04/07/2017)  . Seizures (HCC) 2003 X 1   unknown cause    Patient Active Problem List   Diagnosis Date Noted  . GERD (gastroesophageal reflux disease) 02/16/2018  . Seizures (HCC) 02/16/2018  . History of prolonged Q-T interval on ECG 02/16/2018   . Bronchitis 11/11/2017  . Acute respiratory failure (HCC) 11/03/2017  . Respiratory distress   . Acute respiratory failure with hypoxia (HCC) 11/02/2017  . Chronic diastolic CHF (congestive heart failure) (HCC) 09/19/2017  . Primary osteoarthritis of left hip 04/07/2017  . Osteoarthritis of left hip 04/02/2017  . Lung nodule 02/04/2016  . Asthma exacerbation with COPD (chronic obstructive pulmonary disease) (HCC) 07/21/2015  . Influenza B 04/23/2015  . Asthma exacerbation 04/22/2015  . Hypokalemia 04/22/2015  . 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 Influenza with pneumonia 07/03/2014  . Multiple lung nodules on CT 07/03/2014  . Asthma 07/02/2014  . Hyperglycemia 07/02/2014  . Chronic obstructive pulmonary disease with acute exacerbation (HCC)   . Prolonged QT interval 06/29/2014    Past Surgical History:  Procedure Laterality Date  . JOINT REPLACEMENT    . KNEE ARTHROSCOPY Right 2017  . 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      Home Medications    Prior to Admission medications   Medication Sig Start Date End Date Taking? Authorizing Provider  albuterol (PROVENTIL HFA;VENTOLIN HFA) 108 (90 Base) MCG/ACT inhaler Inhale 1-2 puffs into the lungs every 6 (six) hours as  needed for wheezing or shortness of breath. 02/20/18  Yes Wouk, Wilfred Curtis, MD  albuterol (PROVENTIL) (2.5 MG/3ML) 0.083% nebulizer solution Take 3 mLs (2.5 mg total) by nebulization every 6 (six) hours as needed for wheezing or shortness of breath. 02/20/18  Yes Wouk, Wilfred Curtis, MD  budesonide-formoterol [facility]) 160-4.5 MCG/ACT inhaler Inhale 2 puffs into the lungs 2 (two) times daily. 02/20/18  Yes Wouk, Wilfred Curtis, MD  calcium-vitamin D (OSCAL WITH D) 500-200  MG-UNIT tablet Take 1 tablet by mouth daily with breakfast.   Yes [provider]  montelukast (SINGULAIR) 10 MG tablet Take 10 mg by mouth daily.    Yes [provider]  pantoprazole (PROTONIX) 20 MG tablet Take 20 mg by mouth daily as needed for indigestion.   Yes [provider]  tiotropium (SPIRIVA) 18 MCG inhalation capsule Place 1 capsule (18 mcg total) into inhaler and inhale daily. 02/20/18  Yes Wouk, Wilfred Curtis, MD  triamcinolone lotion (KENALOG) 0.1 % Apply 1 application topically 2 (two) times daily as needed (for rash).  08/29/15  Yes [provider]    Family History Family History  Problem Relation Age of Onset  . Heart disease Mother   . Heart disease Father   . Diabetes Father   . Asthma Father   . Diabetes Brother   . Diabetes Sister   . Asthma Brother   . Asthma Sister   . Hypertension Sister     Social History Social History   Tobacco Use  . Smoking status: Never Smoker  . Smokeless tobacco: Never Used  Substance Use Topics  . Alcohol use: No  . Drug use: No     Allergies   Contrast media [iodinated diagnostic agents]; Peanuts [peanut oil]; Shellfish allergy; Soap; Latex; and Eggs or egg-derived products   Review of Systems Review of Systems  Respiratory: Positive for cough, chest tightness, shortness of breath and wheezing.   All other systems reviewed and are negative.    Physical Exam Updated Vital Signs BP 123/79   Pulse 83   Temp 98.2 F (36.8 C) (Oral)   Resp (!) 26   SpO2 95%   Physical Exam Vitals signs and nursing note reviewed.  Constitutional:      Appearance: She is well-developed.     Comments: Pt appears older than stated age.   HENT:     Head: Normocephalic and atraumatic.     Comments: MM dry Eyes:     Conjunctiva/sclera: Conjunctivae normal.     Pupils: Pupils are equal, round, and reactive to light.  Neck:     Musculoskeletal: Normal range of motion and neck supple.    Cardiovascular:     Rate and Rhythm: Normal rate and regular rhythm.  Pulmonary:     Effort: Accessory muscle usage present. No respiratory distress.     Breath sounds: Wheezing present.     Comments: Patient with increased work of breathing.  Speaking in short, 2 word sentences.  Audible wheezing, inspiratory and expiratory, upon entering the room.  Abdominal:     General: Bowel sounds are normal. There is no distension.     Palpations: Abdomen is soft.     Tenderness: There is no abdominal tenderness.  Musculoskeletal: Normal range of motion.  Skin:    General: Skin is warm and dry.     Capillary Refill: Capillary refill takes less than 2 seconds.  Neurological:     Mental Status: She is alert and oriented to person, place, and time.  ED Treatments / Results  Labs (all labs ordered are listed, but only abnormal results are displayed) Labs Reviewed  BASIC METABOLIC PANEL - Abnormal; Notable for the following components:      Result Value   Potassium 3.4 (*)    Glucose, Bld 111 (*)    All other components within normal limits  CBC WITH DIFFERENTIAL/PLATELET - Abnormal; Notable for the following components:   MCHC 29.7 (*)    Eosinophils Absolute 0.6 (*)    All other components within normal limits    EKG EKG Interpretation  Date/Time:  Friday April 10 2018 01:59:36 EST Ventricular Rate:  85 PR Interval:    QRS Duration: 90 QT Interval:  367 QTC Calculation: 437 R Axis:   78 Text Interpretation:  Sinus rhythm Borderline repolarization abnormality Confirmed by Geoffery LyonseLo, Douglas ([id number]) on 04/10/2018 2:08:54 AM   Radiology Dg Chest 2 View  Result Date: 04/10/2018 CLINICAL DATA:  Asthma, COPD, wheezing, shortness of breath. EXAM: CHEST - 2 VIEW COMPARISON:  02/16/2018 FINDINGS: The heart size and mediastinal contours are within normal limits. Both lungs are clear. The visualized skeletal structures are unremarkable. IMPRESSION: No active cardiopulmonary disease.  Electronically Signed   By: Burman NievesWilliam  Stevens M.D.   On: 04/10/2018 02:33    Procedures .Critical Care Performed by: Alveria Apleyaccavale, Anoushka Divito, PA-C Authorized by: Alveria Apleyaccavale, Redmond Whittley, PA-C   Critical care provider statement:    Critical care time (minutes):  40   Critical care time was exclusive of:  Separately billable procedures and treating other patients and teaching time   Critical care was necessary to treat or prevent imminent or life-threatening deterioration of the following conditions:  Respiratory failure   Critical care was time spent personally by me on the following activities:  Blood draw for specimens, development of treatment plan with patient or surrogate, discussions with consultants, examination of patient, evaluation of patient's response to treatment, obtaining history from patient or surrogate, ordering and performing treatments and interventions, ordering and review of laboratory studies, ordering and review of radiographic studies, pulse oximetry, re-evaluation of patient's condition and review of old charts   I assumed direction of critical care for this patient from another provider in my specialty: no   Comments:     Pt presenting with significant wheezing and asthma exacerbation. Needed continuous neb, and pt with continued significant wheezing. Needs admission   (including critical care time)  Medications Ordered in ED Medications  ipratropium-albuterol (DUONEB) 0.5-2.5 (3) MG/3ML nebulizer solution 3 mL (has no administration in time range)  montelukast (SINGULAIR) tablet 10 mg (has no administration in time range)  enoxaparin (LOVENOX) injection 40 mg (has no administration in time range)  acetaminophen (TYLENOL) tablet 650 mg (has no administration in time range)    Or  acetaminophen (TYLENOL) suppository 650 mg (has no administration in time range)  guaiFENesin (MUCINEX) 12 hr tablet 600 mg (has no administration in time range)  ipratropium-albuterol (DUONEB)  0.5-2.5 (3) MG/3ML nebulizer solution 3 mL (has no administration in time range)  arformoterol (BROVANA) nebulizer solution 15 mcg (has no administration in time range)  budesonide (PULMICORT) nebulizer solution 0.5 mg (has no administration in time range)  azithromycin (ZITHROMAX) 500 mg in sodium chloride 0.9 % 250 mL IVPB (500 mg Intravenous New Bag/Given 04/10/18 0612)  methylPREDNISolone sodium succinate (SOLU-MEDROL) 125 mg/2 mL injection 60 mg (has no administration in time range)  albuterol (PROVENTIL) (2.5 MG/3ML) 0.083% nebulizer solution 5 mg (5 mg Nebulization Given 04/10/18 0138)  methylPREDNISolone sodium succinate (SOLU-MEDROL) 125  mg/2 mL injection 125 mg (125 mg Intravenous Given 04/10/18 0242)  albuterol (PROVENTIL,VENTOLIN) solution continuous neb (10 mg/hr Nebulization Given 04/10/18 0250)  magnesium sulfate IVPB 2 g 50 mL (2 g Intravenous New Bag/Given 04/10/18 0241)  potassium chloride SA (K-DUR,KLOR-CON) CR tablet 40 mEq (40 mEq Oral Given 04/10/18 0611)  ipratropium-albuterol (DUONEB) 0.5-2.5 (3) MG/3ML nebulizer solution 3 mL (3 mLs Nebulization Given 04/10/18 0521)     Initial Impression / Assessment and Plan / ED Course  I have reviewed the triage vital signs and the nursing notes.  Pertinent labs & imaging results that were available during my care of the patient were reviewed by me and considered in my medical decision making (see chart for details).     Pt presenting for evaluation of asthma exacerbation.  Physical exam concerning, patient with increased work of breathing, speaking in short sentences, and with audible wheezing, of this after a breathing treatment.  Will start continuous neb, mag, Solu-Medrol.  Chest x-ray and labs ordered for further evaluation.  Chest x-ray viewed interpreted by me, no pneumonia, pneumothorax, effusion..  Inflation of lungs consistent with COPD.  Labs reassuring, no leukocytosis.  On reassessment, patient with continued  inspiratory and expiratory wheezing and increased work of breathing.  As such, will call for admission.  Discussed with Dr. Katrinka BlazingSmith from [facility] hospitalist service, patient to be admitted.  Final Clinical Impressions(s) / ED Diagnoses   Final diagnoses:  Severe persistent asthma with exacerbation    ED Discharge Orders    None       Alveria ApleyCaccavale, Tyniesha Howald, PA-C 04/10/18 [id number]    Geoffery Lyonselo, Douglas, MD 04/10/18 [phone]

## 2018-04-10 NOTE — ED Notes (Signed)
Bed: [id number] Expected date:  Expected time:  Means of arrival:  Comments: HOLD # 6

## 2018-04-10 NOTE — ED Triage Notes (Signed)
Pt reports she is having an asthma attack. Has been having wheezing since yesterday, last used nebulizer around midnight. Hx asthma/copd. Pt does wear home oxygen at night while she sleeps.

## 2018-04-10 NOTE — ED Notes (Signed)
RRT Provider at bedside. 

## 2018-04-10 NOTE — ED Notes (Signed)
ED TO INPATIENT HANDOFF REPORT  Name/Age/Gender Sonia HummerMary J Drake 62 y.o. female  Code Status    Code Status Orders  (From admission, onward)         Start     Ordered   04/10/18 0438  Full code  Continuous     04/10/18 0442        Code Status History    Date Active Date Inactive Code Status Order ID Comments User Context   02/16/2018 1149 02/18/2018 1616 Full Code [id number]  Bobette Mortiz, David Manuel, MD Inpatient   02/16/2018 1041 02/16/2018 1149 Full Code [id number]  Bobette Mortiz, David Manuel, MD ED   11/03/2017 0218 11/04/2017 2037 Full Code [id number]  Eduard ClosKakrakandy, Arshad N, MD ED   09/19/2017 1808 09/20/2017 1657 Full Code [id number]  Narda BondsNettey, Ralph A, MD Inpatient   04/07/2017 1430 04/09/2017 2229 Full Code [id number]  Allena KatzPhillips, Eric K, PA-C Inpatient   02/16/2017 1415 02/18/2017 1739 Full Code [id number]  Haydee SalterHobbs, Phillip M, MD ED   02/04/2016 0752 02/05/2016 1413 Full Code [id number]  Elgergawy, Leana Roeawood S, MD ED   07/21/2015 2317 07/24/2015 1636 Full Code [id number]  Therisa Doyneoutova, Anastassia, MD ED   04/22/2015 1356 04/24/2015 1422 Full Code [id number]  Rama, Maryruth Bunhristina P, MD Inpatient   07/02/2014 1633 07/07/2014 1531 Full Code [id number]  Conley Canalanga, Simbiso, MD Inpatient   06/29/2014 0055 06/30/2014 2046 Full Code [id number]  Lorretta HarpNiu, Xilin, MD Inpatient      Home/SNF/Other Home  Chief Complaint shortness of breath  Level of Care/Admitting Diagnosis ED Disposition    ED Disposition Condition Comment   Admit  [facility][id number]  Level of Care: Med-Surg [16]  Diagnosis: Asthma exacerbation [235573][[id number]]  Admitting Physician: Clydie BraunSMITH, RONDELL A [[id number]]  Attending Physician: Clydie BraunSMITH, RONDELL A [[id number]]  PT Class (Do Not Modify): Observation [104]  PT Acc Code (Do Not Modify): Observation [10022]       Medical History Past Medical History:  Diagnosis Date  . Arthritis    "right knee; left hip" (04/07/2017)  . Asthma   . CHF (congestive heart failure) (HCC)    reported  treatment for fluid overload in the setting of asthma exacerbation ~ 1998  . COPD (chronic obstructive pulmonary disease) (HCC)    patient states she does not have COPD but was treated for it.   . 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 Dyspnea   . Dysrhythmia    occasional skipped beats  . GERD (gastroesophageal reflux disease)   . On home oxygen therapy    "I sleep with 2L" (04/07/2017)  . Pneumonia    "several times" (04/07/2017)  . Seizures (HCC) 2003 X 1   unknown cause    Allergies Allergies  Allergen Reactions  . Contrast Media [Iodinated Diagnostic Agents] Anaphylaxis and Other (See Comments)    ** CARDIAC ARREST **  . Peanuts [Peanut Oil] Shortness Of Breath and Swelling  . Shellfish Allergy Shortness Of Breath and Swelling  . Soap Shortness Of Breath and Swelling    LAUNDRY DETERGENT  . Latex Hives  . Eggs Or Egg-Derived Products Rash    IV Location/Drains/Wounds Patient Lines/Drains/Airways Status   Active Line/Drains/Airways    Name:   Placement date:   Placement time:   Site:   Days:   Peripheral IV 04/10/18 Right Antecubital   04/10/18    0238    Antecubital   less than 1          Labs/Imaging Results for orders placed or  performed during the hospital encounter of 04/10/18 (from the past 48 hour(s))  Basic metabolic panel     Status: Abnormal   Collection Time: 04/10/18  2:38 AM  Result Value Ref Range   Sodium 141 135 - 145 mmol/L   Potassium 3.4 (L) 3.5 - 5.1 mmol/L   Chloride 103 98 - 111 mmol/L   CO2 30 22 - 32 mmol/L   Glucose, Bld 111 (H) 70 - 99 mg/dL   BUN 8 8 - 23 mg/dL   Creatinine, Ser 1.47 0.44 - 1.00 mg/dL   Calcium 9.4 8.9 - 82.9 mg/dL   GFR calc non Af Amer >60 >60 mL/min   GFR calc Af Amer >60 >60 mL/min   Anion gap 8 5 - 15    Comment: Performed at [facility], [address], [place], [place] [address]  CBC with Differential/Platelet     Status: Abnormal   Collection Time: 04/10/18  2:38 AM   Result Value Ref Range   WBC 5.9 4.0 - 10.5 K/uL   RBC 4.31 3.87 - 5.11 MIL/uL   Hemoglobin 12.3 12.0 - 15.0 g/dL   HCT 08.6 57.8 - 46.9 %   MCV 96.1 80.0 - 100.0 fL   MCH 28.5 26.0 - 34.0 pg   MCHC 29.7 (L) 30.0 - 36.0 g/dL   RDW 62.9 52.8 - 41.3 %   Platelets 222 150 - 400 K/uL   nRBC 0.0 0.0 - 0.2 %   Neutrophils Relative % 43 %   Neutro Abs 2.6 1.7 - 7.7 K/uL   Lymphocytes Relative 36 %   Lymphs Abs 2.1 0.7 - 4.0 K/uL   Monocytes Relative 9 %   Monocytes Absolute 0.6 0.1 - 1.0 K/uL   Eosinophils Relative 11 %   Eosinophils Absolute 0.6 (H) 0.0 - 0.5 K/uL   Basophils Relative 1 %   Basophils Absolute 0.0 0.0 - 0.1 K/uL   Immature Granulocytes 0 %   Abs Immature Granulocytes 0.01 0.00 - 0.07 K/uL    Comment: Performed at [facility], [address] [place], [place] [address]   Dg Chest 2 View  Result Date: 04/10/2018 CLINICAL DATA:  Asthma, COPD, wheezing, shortness of breath. EXAM: CHEST - 2 VIEW COMPARISON:  02/16/2018 FINDINGS: The heart size and mediastinal contours are within normal limits. Both lungs are clear. The visualized skeletal structures are unremarkable. IMPRESSION: No active cardiopulmonary disease. Electronically Signed   By: Burman Nieves M.D.   On: 04/10/2018 02:33   EKG Interpretation  Date/Time:  Friday April 10 2018 01:59:36 EST Ventricular Rate:  85 PR Interval:    QRS Duration: 90 QT Interval:  367 QTC Calculation: 437 R Axis:   78 Text Interpretation:  Sinus rhythm Borderline repolarization abnormality Confirmed by Geoffery Lyons ([id number]) on 04/10/2018 2:08:54 AM   Pending Labs Unresulted Labs (From admission, onward)    Start     Ordered   04/11/18 0500  CBC  Tomorrow morning,   R     04/10/18 0442   04/11/18 0500  Basic metabolic panel  Tomorrow morning,   R     04/10/18 0442   04/10/18 0931  Respiratory Panel by PCR  (Respiratory virus panel with precautions)  Once,   R     04/10/18 0930           Vitals/Pain Today's Vitals   04/10/18 1140 04/10/18 1200 04/10/18 1315 04/10/18 1316  BP:   (!) 116/55   Pulse:   71  Resp:   (!) 21   Temp:   97.6 F (36.4 C)   TempSrc:   Oral   SpO2:  95% 94%   Weight:      Height:      PainSc: 0-No pain   0-No pain    Isolation Precautions Droplet precaution  Medications Medications  ipratropium-albuterol (DUONEB) 0.5-2.5 (3) MG/3ML nebulizer solution 3 mL (has no administration in time range)  montelukast (SINGULAIR) tablet 10 mg (10 mg Oral Given 04/10/18 1048)  enoxaparin (LOVENOX) injection 40 mg (40 mg Subcutaneous Given 04/10/18 1046)  acetaminophen (TYLENOL) tablet 650 mg (has no administration in time range)    Or  acetaminophen (TYLENOL) suppository 650 mg (has no administration in time range)  guaiFENesin (MUCINEX) 12 hr tablet 600 mg (600 mg Oral Given 04/10/18 1046)  ipratropium-albuterol (DUONEB) 0.5-2.5 (3) MG/3ML nebulizer solution 3 mL (3 mLs Nebulization Given 04/10/18 1200)  arformoterol (BROVANA) nebulizer solution 15 mcg (15 mcg Nebulization Given 04/10/18 1047)  budesonide (PULMICORT) nebulizer solution 0.5 mg (0.5 mg Nebulization Given 04/10/18 0800)  azithromycin (ZITHROMAX) 500 mg in sodium chloride 0.9 % 250 mL IVPB (0 mg Intravenous Stopped 04/10/18 0729)  methylPREDNISolone sodium succinate (SOLU-MEDROL) 125 mg/2 mL injection 60 mg (60 mg Intravenous Given 04/10/18 0845)  albuterol (PROVENTIL) (2.5 MG/3ML) 0.083% nebulizer solution 5 mg (5 mg Nebulization Given 04/10/18 0138)  methylPREDNISolone sodium succinate (SOLU-MEDROL) 125 mg/2 mL injection 125 mg (125 mg Intravenous Given 04/10/18 0242)  albuterol (PROVENTIL,VENTOLIN) solution continuous neb (10 mg/hr Nebulization Given 04/10/18 0250)  magnesium sulfate IVPB 2 g 50 mL (0 g Intravenous Stopped 04/10/18 0729)  potassium chloride SA (K-DUR,KLOR-CON) CR tablet 40 mEq (40 mEq Oral Given 04/10/18 0611)  ipratropium-albuterol (DUONEB) 0.5-2.5 (3) MG/3ML  nebulizer solution 3 mL (3 mLs Nebulization Given 04/10/18 0521)    Mobility walks

## 2018-04-10 NOTE — Progress Notes (Signed)
PROGRESS NOTE    Sonia Drake  GNF:[id number] DOB: 08/12/1955 DOA: 04/10/2018 PCP: Verlon AuBoyd, Tammy Lamonica, MD    Brief Narrative:  62 y.o. 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  She reports having a productive cough with thick greenish sputum production and wheezing.  She tried utilizing her inhalers and nebulizer machine without relief of symptoms.  Feels symptoms were brought on with the recent changes in the weather.  Denies having any fever, nausea, vomiting, diarrhea, headache, seizure activity, or myalgias.  Associated symptoms include centralized chest tightness and discomfort with coughing.  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  Review of records show spirometry readings from 10/2017 with pulmonology show a mixed picture of COPD and asthma.  She has had 3 prior admissions into the hospital for COPD over this year.  Patient also notes previous history of requiring intubation due to symptoms back in 2003.   ED Course: Upon admission patient was noted to be afebrile with vital signs relatively within normal limits with patient currently on 2 L of nasal cannula oxygen.  Chest x-ray showed no acute abnormalities.  Patient was given 125 mg of Solu-Medrol IV, 2 g of magnesium sulfate IV, 1 albuterol treatment and 1 continuous hour-long nebulizer treatment without relief of symptoms.  Assessment & Plan:   Principal Problem:   Asthma exacerbation with COPD (chronic obstructive pulmonary disease) (HCC) Active Problems:   Asthma exacerbation   Hypokalemia  Mixed asthma/COPD exacerbation: Acute.   -Patient presents with productive cough, shortness of breath, and wheezing.   -Chest x-ray showing no acute abnormality.  Patient  with recent spirometry in 10/2017 showing severe airway obstruction with low vital capacity. Patient normally on 2 L of nasal cannula oxygen only at night. - Admit to a Med-surg bed - Solumedrol 60 mg IV 8hr - Duonebs 4 times daily - Brovana and budesonide nebs to replace Symbicort inhaler - Azithromycin IV - Mucinex - Continue Singulair - Patient with positive sick contact, no flu vaccine this year. Will check viral resp panel  Hypokalemia: Acute. Initial potassium noted with 3.4 on admission. - Given 40 mEq of potassium chloride x1 dose now - Continue to monitor and replace as needed  Diastolic CHF: Patient appears to be euvolemic at this time.  Last echocardiogram from 06/2014 showed EF of 60 to 65% with grade 2 diastolic dysfunction. -Currently stable  History of seizures: Patient does not report any recent seizures and is not on any antiepileptic medications. -remains seizure free  DVT prophylaxis: Lovenox subQ Code Status: Full Family Communication: Pt in room, family not at bedside Disposition Plan: Uncertain at this time  Consultants:     Procedures:     Antimicrobials: Anti-infectives (From admission, onward)   Start     Dose/Rate Route Frequency Ordered Stop   04/10/18 0515  azithromycin (ZITHROMAX) 500 mg in sodium chloride 0.9 % 250 mL IVPB     500 mg 250 mL/hr over 60 Minutes Intravenous Daily 04/10/18 0511         Subjective: Still wheezing this AM  Objective: Vitals:   04/10/18 0854 04/10/18 1200 04/10/18 1315 04/10/18 1442  BP: 112/61  (!) 116/55 (!) 125/59  Pulse: 81  71 81  Resp: (!) 21  (!) 21 16  Temp: 97.6 F (36.4 C)  97.6 F (36.4 C) 97.7 F (36.5 C)  TempSrc: Oral  Oral   SpO2: 96% 95% 94% 97%  Weight:      Height:        Intake/Output Summary (Last 24 hours) at 04/10/2018 1612 Last data filed at 04/10/2018 1354 Gross per 24 hour  Intake 1540 ml  Output -  Net 1540 ml   Filed Weights   04/10/18 0836  Weight: 89.8 kg     Examination: General exam: Awake, laying in bed, in nad Respiratory system: Normal respiratory effort, wheezing in all lung fields Cardiovascular system: regular rate, s1, s2 Gastrointestinal system: Soft, nondistended, positive BS Central nervous system: CN2-12 grossly intact, strength intact Extremities: Perfused, no clubbing Skin: Normal skin turgor, no notable skin lesions seen Psychiatry: Mood normal // no visual hallucinations   Data Reviewed: I have personally reviewed following labs and imaging studies  CBC: Recent Labs  Lab 04/10/18 0238  WBC 5.9  NEUTROABS 2.6  HGB 12.3  HCT 41.4  MCV 96.1  PLT 222   Basic Metabolic Panel: Recent Labs  Lab 04/10/18 0238  NA 141  K 3.4*  CL 103  CO2 30  GLUCOSE 111*  BUN 8  CREATININE 0.61  CALCIUM 9.4   GFR: Estimated Creatinine Clearance: 76.4 mL/min (by C-G formula based on SCr of 0.61 mg/dL). Liver Function Tests: No results for input(s): AST, ALT, ALKPHOS, BILITOT, PROT, ALBUMIN in the last 168 hours. No results for input(s): LIPASE, AMYLASE in the last 168 hours. No results for input(s): AMMONIA in the last 168 hours. Coagulation Profile: No results for input(s): INR, PROTIME in the last 168 hours. Cardiac Enzymes: No results for input(s): CKTOTAL, CKMB, CKMBINDEX, TROPONINI in the last 168 hours. BNP (last 3 results) No results for input(s): PROBNP in the last 8760 hours. HbA1C: No results for input(s): HGBA1C in the last 72 hours. CBG: No results for input(s): GLUCAP in the last 168 hours. Lipid Profile: No results for input(s): CHOL, HDL, LDLCALC, TRIG, CHOLHDL, LDLDIRECT in the last 72 hours. Thyroid Function Tests: No results for input(s): TSH, T4TOTAL, FREET4, T3FREE, THYROIDAB in the last 72 hours. Anemia Panel: No results for input(s): VITAMINB12, FOLATE, FERRITIN, TIBC, IRON, RETICCTPCT in the last 72 hours. Sepsis Labs: No results for input(s): PROCALCITON, LATICACIDVEN in the last 168  hours.  No results found for this or any previous visit (from the past 240 hour(s)).   Radiology Studies: Dg Chest 2 View  Result Date: 04/10/2018 CLINICAL DATA:  Asthma, COPD, wheezing, shortness of breath. EXAM: CHEST - 2 VIEW COMPARISON:  02/16/2018 FINDINGS: The heart size and mediastinal contours are within normal limits. Both lungs are clear. The visualized skeletal structures are unremarkable. IMPRESSION: No active cardiopulmonary disease. Electronically Signed   By: Burman NievesWilliam  Stevens M.D.   On: 04/10/2018 02:33    Scheduled Meds: . arformoterol  15 mcg Nebulization BID  . budesonide (PULMICORT) nebulizer solution  0.5 mg Nebulization BID  . enoxaparin (LOVENOX) injection  40 mg Subcutaneous Q24H  . guaiFENesin  600 mg Oral BID  . ipratropium-albuterol  3 mL Nebulization QID  . methylPREDNISolone (SOLU-MEDROL) injection  60 mg Intravenous Q8H  . montelukast  10 mg Oral Daily   Continuous Infusions: . azithromycin Stopped (04/10/18 0729)     LOS: 0 days   Rickey BarbaraStephen Geno Sydnor, MD [facility] Pager On Amion  If 7PM-7AM, please contact night-coverage 04/10/2018, 4:12 PM

## 2018-04-10 NOTE — Progress Notes (Signed)
Attempted to call ED to get report but unsuccessful.

## 2018-04-10 NOTE — H&P (Signed)
History and Physical    Sonia Drake:[id number] DOB: [birth year] DOA: 04/10/2018  Referring MD/NP/PA: Alveria Apley, PA-C PCP: Verlon Au, MD  Patient coming from: Home  Chief Complaint: Shortness of breath  I have personally briefly reviewed patient's old medical records in [facility]   HPI: Sonia Drake is a 62 y.o. 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  She reports having a productive cough with thick greenish sputum production and wheezing.  She tried utilizing her inhalers and nebulizer machine without relief of symptoms.  Feels symptoms were brought on with the recent changes in the weather.  Denies having any fever, nausea, vomiting, diarrhea, headache, seizure activity, or myalgias.  Associated symptoms include centralized chest tightness and discomfort with coughing.  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  Review of records show spirometry readings from 10/2017 with pulmonology show a mixed picture of COPD and asthma.  She has had 3 prior admissions into the hospital for COPD over this year.  Patient also notes previous history of requiring intubation due to symptoms back in 2003.   ED Course: Upon admission patient was noted to be afebrile with vital signs relatively within normal limits with patient currently on 2 L of nasal cannula oxygen.  Chest x-ray showed no acute abnormalities.  Patient was given 125 mg of Solu-Medrol IV, 2 g of magnesium sulfate IV, 1 albuterol treatment and 1 continuous hour-long nebulizer treatment without relief of symptoms.  Review of Systems  Constitutional: Positive for malaise/fatigue. Negative for chills and fever.  HENT: Positive for congestion.   Eyes:  Negative for photophobia and pain.  Respiratory: Positive for cough, sputum production, shortness of breath and wheezing.   Cardiovascular: Positive for PND. Negative for chest pain, orthopnea and leg swelling.  Gastrointestinal: Negative for abdominal pain, nausea and vomiting.  Genitourinary: Negative for dysuria and hematuria.  Musculoskeletal: Negative for back pain and joint pain.  Neurological: Negative for seizures and weakness.  Psychiatric/Behavioral: Negative for substance abuse. The patient has insomnia. The patient is not nervous/anxious.     Past Medical History:  Diagnosis Date  . Arthritis    "right knee; left hip" (04/07/2017)  . Asthma   . CHF (congestive heart failure) (HCC)    reported treatment for fluid overload in the setting of asthma exacerbation ~ 1998  . COPD (chronic obstructive pulmonary disease) (HCC)    patient states she does not have COPD but was treated for it.   . 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 Dyspnea   . Dysrhythmia    occasional skipped beats  . GERD (gastroesophageal reflux disease)   . On home oxygen therapy    "I sleep with 2L" (04/07/2017)  . Pneumonia    "several times" (04/07/2017)  . Seizures (HCC) 2003 X 1   unknown cause    Past Surgical History:  Procedure Laterality Date  . JOINT REPLACEMENT    . KNEE ARTHROSCOPY Right 2017  . 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 She  has never used smokeless tobacco. She reports that she does not drink alcohol or use drugs.  Allergies  Allergen Reactions  . Contrast Media [Iodinated Diagnostic Agents] Anaphylaxis and Other (See Comments)    ** CARDIAC ARREST **  . Peanuts [Peanut Oil] Shortness Of Breath and Swelling  . Shellfish  Allergy Shortness Of Breath and Swelling  . Soap Shortness Of Breath and Swelling    LAUNDRY DETERGENT  . Latex Hives  . Eggs Or Egg-Derived Products Rash    Family History  Problem Relation Age of Onset  . Heart disease Mother   . Heart disease Father   . Diabetes Father   . Asthma Father   . Diabetes Brother   . Diabetes Sister   . Asthma Brother   . Asthma Sister   . Hypertension Sister     Prior to Admission medications   Medication Sig Start Date End Date Taking? Authorizing Provider  albuterol (PROVENTIL HFA;VENTOLIN HFA) 108 (90 Base) MCG/ACT inhaler Inhale 1-2 puffs into the lungs every 6 (six) hours as needed for wheezing or shortness of breath. 02/20/18  Yes Wouk, Wilfred CurtisNoah Bedford, MD  albuterol (PROVENTIL) (2.5 MG/3ML) 0.083% nebulizer solution Take 3 mLs (2.5 mg total) by nebulization every 6 (six) hours as needed for wheezing or shortness of breath. 02/20/18  Yes Wouk, Wilfred CurtisNoah Bedford, MD  budesonide-formoterol [facility](SYMBICORT) 160-4.5 MCG/ACT inhaler Inhale 2 puffs into the lungs 2 (two) times daily. 02/20/18  Yes Wouk, Wilfred CurtisNoah Bedford, MD  calcium-vitamin D (OSCAL WITH D) 500-200 MG-UNIT tablet Take 1 tablet by mouth daily with breakfast.   Yes [provider]  montelukast (SINGULAIR) 10 MG tablet Take 10 mg by mouth daily.    Yes [provider]  pantoprazole (PROTONIX) 20 MG tablet Take 20 mg by mouth daily as needed for indigestion.   Yes [provider]  tiotropium (SPIRIVA) 18 MCG inhalation capsule Place 1 capsule (18 mcg total) into inhaler and inhale daily. 02/20/18  Yes Wouk, Wilfred CurtisNoah Bedford, MD  triamcinolone lotion (KENALOG) 0.1 % Apply 1 application topically 2 (two) times daily as needed (for rash).  08/29/15  Yes [provider]    Physical Exam:  Constitutional: Elderly female who appears to be in some moderate respiratory distress Vitals:   04/10/18 0125 04/10/18 0202 04/10/18 0230 04/10/18 0250  BP: 138/72 112/76 (!) 120/57   Pulse: 94  88 80   Resp: 20 19 (!) 22   Temp: 98.2 F (36.8 C)     TempSrc: Oral     SpO2: 92% 97% 98% 100%   Eyes: PERRL, lids and conjunctivae normal ENMT: Mucous membranes are moist. Posterior pharynx clear of any exudate or lesions. Normal dentition.  Neck: normal, supple, no masses, no thyromegaly Respiratory: Decreased overall aeration with diffuse expiratory wheezes bilaterally.  Currently talking in 2-3 word sentences on 2 L nasal cannula oxygen. Cardiovascular: Regular rate and rhythm, no murmurs / rubs / gallops. No extremity edema. 2+ pedal pulses. No carotid bruits.  Abdomen: no tenderness, no masses palpated. No hepatosplenomegaly. Bowel sounds positive.  Musculoskeletal: no clubbing / cyanosis. No joint deformity upper and lower extremities. Good ROM, no contractures. Normal muscle tone.  Skin: no rashes, lesions, ulcers. No induration Neurologic: CN 2-12 grossly intact. Sensation intact, DTR normal. Strength 5/5 in all 4.  Psychiatric: Normal judgment and insight. Alert and oriented x 3. Normal mood.     Labs on Admission: I have personally reviewed following labs and imaging studies  CBC: Recent Labs  Lab  04/10/18 0238  WBC 5.9  NEUTROABS 2.6  HGB 12.3  HCT 41.4  MCV 96.1  PLT 222   Basic Metabolic Panel: Recent Labs  Lab 04/10/18 0238  NA 141  K 3.4*  CL 103  CO2 30  GLUCOSE 111*  BUN 8  CREATININE 0.61  CALCIUM 9.4   GFR: CrCl cannot be calculated (Unknown ideal weight.). Liver Function Tests: No results for input(s): AST, ALT, ALKPHOS, BILITOT, PROT, ALBUMIN in the last 168 hours. No results for input(s): LIPASE, AMYLASE in the last 168 hours. No results for input(s): AMMONIA in the last 168 hours. Coagulation Profile: No results for input(s): INR, PROTIME in the last 168 hours. Cardiac Enzymes: No results for input(s): CKTOTAL, CKMB, CKMBINDEX, TROPONINI in the last 168 hours. BNP (last 3 results) No results for input(s): PROBNP in the last 8760  hours. HbA1C: No results for input(s): HGBA1C in the last 72 hours. CBG: No results for input(s): GLUCAP in the last 168 hours. Lipid Profile: No results for input(s): CHOL, HDL, LDLCALC, TRIG, CHOLHDL, LDLDIRECT in the last 72 hours. Thyroid Function Tests: No results for input(s): TSH, T4TOTAL, FREET4, T3FREE, THYROIDAB in the last 72 hours. Anemia Panel: No results for input(s): VITAMINB12, FOLATE, FERRITIN, TIBC, IRON, RETICCTPCT in the last 72 hours. Urine analysis:    Component Value Date/Time   COLORURINE YELLOW 03/31/2017 1500   APPEARANCEUR CLEAR 03/31/2017 1500   LABSPEC 1.016 03/31/2017 1500   PHURINE 7.0 03/31/2017 1500   GLUCOSEU NEGATIVE 03/31/2017 1500   HGBUR NEGATIVE 03/31/2017 1500   BILIRUBINUR NEGATIVE 03/31/2017 1500   KETONESUR NEGATIVE 03/31/2017 1500   PROTEINUR NEGATIVE 03/31/2017 1500   UROBILINOGEN 0.2 07/03/2014 1456   NITRITE NEGATIVE 03/31/2017 1500   LEUKOCYTESUR NEGATIVE 03/31/2017 1500   Sepsis Labs: No results found for this or any previous visit (from the past 240 hour(s)).   Radiological Exams on Admission: Dg Chest 2 View  Result Date: 04/10/2018 CLINICAL DATA:  Asthma, COPD, wheezing, shortness of breath. EXAM: CHEST - 2 VIEW COMPARISON:  02/16/2018 FINDINGS: The heart size and mediastinal contours are within normal limits. Both lungs are clear. The visualized skeletal structures are unremarkable. IMPRESSION: No active cardiopulmonary disease. Electronically Signed   By: Burman NievesWilliam  Stevens M.D.   On: 04/10/2018 02:33    EKG: Independently reviewed.  Sinus rhythm with prolonged PR interval at 85 bpm.  QTc within normal limits  Assessment/Plan Mixed asthma/COPD exacerbation: Acute.  Patient presents with productive cough, shortness of breath, and wheezing.  Chest x-ray showing no acute abnormality.  Patient with recent spirometry in 10/2017 showing severe airway obstruction with low vital capacity. Patient normally on 2 L of nasal cannula  oxygen only at night. - Admit to a Med-surg bed - Solumedrol 60 mg IV 8hr - Duonebs 4 times daily - Brovana and budesonide nebs to replace Symbicort inhaler - Azithromycin IV - Mucinex - Continue Singulair  Hypokalemia: Acute. Initial potassium noted with 3.4 on admission. - Given 40 mEq of potassium chloride x1 dose now - Continue to monitor and replace as needed  Diastolic CHF: Patient appears to be euvolemic at this time.  Last echocardiogram from 06/2014 showed EF of 60 to 65% with grade 2 diastolic dysfunction.  History of seizures: Patient does not report any recent seizures and is not on any antiepileptic medications.  DVT prophylaxis: Lovenox Code Status: Full Family Communication: No family present at bedside Disposition Plan: Likely discharge home in 1 to 2 days Consults called: None Admission status: Observation  Clydie Braun MD [facility] Pager [phone]   If 7PM-7AM, please contact night-coverage [email or web address] Password [email or web address]  04/10/2018, 4:12 AM

## 2018-04-10 NOTE — ED Notes (Signed)
Bed: WA06 Expected date:  Expected time:  Means of arrival:  Comments: 

## 2018-04-11 DIAGNOSIS — K219 Gastro-esophageal reflux disease without esophagitis: Secondary | ICD-10-CM | POA: Diagnosis present

## 2018-04-11 DIAGNOSIS — J441 Chronic obstructive pulmonary disease with (acute) exacerbation: Secondary | ICD-10-CM | POA: Diagnosis present

## 2018-04-11 DIAGNOSIS — Z91040 Latex allergy status: Secondary | ICD-10-CM | POA: Diagnosis not present

## 2018-04-11 DIAGNOSIS — Z7951 Long term (current) use of inhaled steroids: Secondary | ICD-10-CM | POA: Diagnosis not present

## 2018-04-11 DIAGNOSIS — Z825 Family history of asthma and other chronic lower respiratory diseases: Secondary | ICD-10-CM | POA: Diagnosis not present

## 2018-04-11 DIAGNOSIS — Z8701 Personal history of pneumonia (recurrent): Secondary | ICD-10-CM | POA: Diagnosis not present

## 2018-04-11 DIAGNOSIS — Z8249 Family history of ischemic heart disease and other diseases of the circulatory system: Secondary | ICD-10-CM | POA: Diagnosis not present

## 2018-04-11 DIAGNOSIS — J4551 Severe persistent asthma with (acute) exacerbation: Secondary | ICD-10-CM | POA: Diagnosis present

## 2018-04-11 DIAGNOSIS — J45901 Unspecified asthma with (acute) exacerbation: Secondary | ICD-10-CM | POA: Diagnosis not present

## 2018-04-11 DIAGNOSIS — Z9981 Dependence on supplemental oxygen: Secondary | ICD-10-CM | POA: Diagnosis not present

## 2018-04-11 DIAGNOSIS — Z91041 Radiographic dye allergy status: Secondary | ICD-10-CM | POA: Diagnosis not present

## 2018-04-11 DIAGNOSIS — Z833 Family history of diabetes mellitus: Secondary | ICD-10-CM | POA: Diagnosis not present

## 2018-04-11 DIAGNOSIS — Z96642 Presence of left artificial hip joint: Secondary | ICD-10-CM | POA: Diagnosis present

## 2018-04-11 DIAGNOSIS — I5032 Chronic diastolic (congestive) heart failure: Secondary | ICD-10-CM | POA: Diagnosis present

## 2018-04-11 DIAGNOSIS — Z79899 Other long term (current) drug therapy: Secondary | ICD-10-CM | POA: Diagnosis not present

## 2018-04-11 DIAGNOSIS — E876 Hypokalemia: Secondary | ICD-10-CM | POA: Diagnosis present

## 2018-04-11 DIAGNOSIS — J988 Other specified respiratory disorders: Secondary | ICD-10-CM | POA: Diagnosis present

## 2018-04-11 LAB — CBC
HCT: 44.2 % (ref 36.0–46.0)
Hemoglobin: 13 g/dL (ref 12.0–15.0)
MCH: 27.7 pg (ref 26.0–34.0)
MCHC: 29.4 g/dL — ABNORMAL LOW (ref 30.0–36.0)
MCV: 94 fL (ref 80.0–100.0)
Platelets: 263 10*3/uL (ref 150–400)
RBC: 4.7 MIL/uL (ref 3.87–5.11)
RDW: 14.7 % (ref 11.5–15.5)
WBC: 13.2 10*3/uL — ABNORMAL HIGH (ref 4.0–10.5)
nRBC: 0 % (ref 0.0–0.2)

## 2018-04-11 LAB — BASIC METABOLIC PANEL
Anion gap: 11 (ref 5–15)
BUN: 9 mg/dL (ref 8–23)
CO2: 26 mmol/L (ref 22–32)
Calcium: 10.4 mg/dL — ABNORMAL HIGH (ref 8.9–10.3)
Chloride: 106 mmol/L (ref 98–111)
Creatinine, Ser: 0.64 mg/dL (ref 0.44–1.00)
GFR calc Af Amer: >60 mL/min (ref >60)
GFR calc non Af Amer: >60 mL/min (ref >60)
Glucose, Bld: 146 mg/dL — ABNORMAL HIGH (ref 70–99)
Potassium: 4.8 mmol/L (ref 3.5–5.1)
Sodium: 143 mmol/L (ref 135–145)

## 2018-04-11 NOTE — Progress Notes (Signed)
PROGRESS NOTE    Sonia Drake  WGN:[id number] DOB: 09-25-1955 DOA: 04/10/2018 PCP: Verlon Au, MD    Brief Narrative:  62 y.o. 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  She reports having a productive cough with thick greenish sputum production and wheezing.  She tried utilizing her inhalers and nebulizer machine without relief of symptoms.  Feels symptoms were brought on with the recent changes in the weather.  Denies having any fever, nausea, vomiting, diarrhea, headache, seizure activity, or myalgias.  Associated symptoms include centralized chest tightness and discomfort with coughing.  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  Review of records show spirometry readings from 10/2017 with pulmonology show a mixed picture of COPD and asthma.  She has had 3 prior admissions into the hospital for COPD over this year.  Patient also notes previous history of requiring intubation due to symptoms back in 2003.   ED Course: Upon admission patient was noted to be afebrile with vital signs relatively within normal limits with patient currently on 2 L of nasal cannula oxygen.  Chest x-ray showed no acute abnormalities.  Patient was given 125 mg of Solu-Medrol IV, 2 g of magnesium sulfate IV, 1 albuterol treatment and 1 continuous hour-long nebulizer treatment without relief of symptoms.  Assessment & Plan:   Principal Problem:   Asthma exacerbation with COPD (chronic obstructive pulmonary disease) (HCC) Active Problems:   Asthma exacerbation   Hypokalemia  Mixed asthma/COPD exacerbation: Acute.   -Patient presents with productive cough, shortness of breath, and wheezing.   -Chest x-ray showing no acute abnormality.  Patient  with recent spirometry in 10/2017 showing severe airway obstruction with low vital capacity. Patient normally on 2 L of nasal cannula oxygen only at night. - Still with marked wheezing this AM and increased O2 dependency over baseline - Solumedrol 60 mg IV 8hr - Duonebs 4 times daily with q2hrs PRN - Brovana and budesonide nebs to replace Symbicort inhaler - Continued on empiric Azithromycin IV - Resp viral panel pos for Coronavirus  Hypokalemia: Acute. Initial potassium noted with 3.4 on admission. - Replaced  Diastolic CHF: Patient appears to be euvolemic at this time.  Last echocardiogram from 06/2014 showed EF of 60 to 65% with grade 2 diastolic dysfunction. -remains euvolemic  History of seizures: Patient does not report any recent seizures and is not on any antiepileptic medications. -remains seizure freethus far  DVT prophylaxis: Lovenox subQ Code Status: Full Family Communication: Pt in room, family not at bedside Disposition Plan: Uncertain at this time  Consultants:     Procedures:     Antimicrobials: Anti-infectives (From admission, onward)   Start     Dose/Rate Route Frequency Ordered Stop   04/10/18 0515  azithromycin (ZITHROMAX) 500 mg in sodium chloride 0.9 % 250 mL IVPB     500 mg 250 mL/hr over 60 Minutes Intravenous Daily 04/10/18 0511        Subjective: Still wheezing this AM  Objective: Vitals:   04/11/18 0536 04/11/18 0911 04/11/18 1201 04/11/18 1419  BP:    (!) 124/56  Pulse:    84  Resp:    20  Temp:    97.9 F (36.6 C)  TempSrc:    Oral  SpO2: 97% 98% 98% 95%  Weight:      Height:        Intake/Output Summary (Last 24 hours) at 04/11/2018 1720 Last data filed at 04/11/2018 1422 Gross per 24 hour  Intake 480 ml  Output 6 ml  Net 474 ml   Filed Weights   04/10/18 0836  Weight: 89.8 kg    Examination: General exam: Conversant, in no acute distress Respiratory system: normal chest rise, clear, marked wheezing heard across  room Cardiovascular system: regular rhythm, s1-s2 Gastrointestinal system: Nondistended, nontender, pos BS Central nervous system: No seizures, no tremors Extremities: No cyanosis, no joint deformities Skin: No rashes, no pallor Psychiatry: Affect normal // no auditory hallucinations   Data Reviewed: I have personally reviewed following labs and imaging studies  CBC: Recent Labs  Lab 04/10/18 0238 04/11/18 0621  WBC 5.9 13.2*  NEUTROABS 2.6  --   HGB 12.3 13.0  HCT 41.4 44.2  MCV 96.1 94.0  PLT 222 263   Basic Metabolic Panel: Recent Labs  Lab 04/10/18 0238 04/11/18 0621  NA 141 143  K 3.4* 4.8  CL 103 106  CO2 30 26  GLUCOSE 111* 146*  BUN 8 9  CREATININE 0.61 0.64  CALCIUM 9.4 10.4*   GFR: Estimated Creatinine Clearance: 76.4 mL/min (by C-G formula based on SCr of 0.64 mg/dL). Liver Function Tests: No results for input(s): AST, ALT, ALKPHOS, BILITOT, PROT, ALBUMIN in the last 168 hours. No results for input(s): LIPASE, AMYLASE in the last 168 hours. No results for input(s): AMMONIA in the last 168 hours. Coagulation Profile: No results for input(s): INR, PROTIME in the last 168 hours. Cardiac Enzymes: No results for input(s): CKTOTAL, CKMB, CKMBINDEX, TROPONINI in the last 168 hours. BNP (last 3 results) No results for input(s): PROBNP in the last 8760 hours. HbA1C: No results for input(s): HGBA1C in the last 72 hours. CBG: No results for input(s): GLUCAP in the last 168 hours. Lipid Profile: No results for input(s): CHOL, HDL, LDLCALC, TRIG, CHOLHDL, LDLDIRECT in the last 72 hours. Thyroid Function Tests: No results for input(s): TSH, T4TOTAL, FREET4, T3FREE, THYROIDAB in the last 72 hours. Anemia Panel: No results for input(s): VITAMINB12, FOLATE, FERRITIN, TIBC, IRON, RETICCTPCT in the last 72 hours. Sepsis Labs: No results for input(s): PROCALCITON, LATICACIDVEN in the last 168 hours.  Recent Results (from the past 240 hour(s))  Respiratory Panel by  PCR     Status: Abnormal   Collection Time: 04/10/18  9:31 AM  Result Value Ref Range Status   Adenovirus NOT DETECTED NOT DETECTED Final   Coronavirus 229E NOT DETECTED NOT DETECTED Final   Coronavirus HKU1 DETECTED (A) NOT DETECTED Final   Coronavirus NL63 NOT DETECTED NOT DETECTED Final   Coronavirus OC43 NOT DETECTED NOT DETECTED Final   Metapneumovirus NOT DETECTED NOT DETECTED Final   Rhinovirus / Enterovirus NOT DETECTED NOT DETECTED Final   Influenza A NOT DETECTED NOT DETECTED Final   Influenza B NOT DETECTED NOT DETECTED Final   Parainfluenza Virus 1 NOT DETECTED NOT DETECTED Final   Parainfluenza Virus 2 NOT DETECTED NOT DETECTED Final   Parainfluenza Virus 3 NOT DETECTED NOT DETECTED Final   Parainfluenza Virus 4 NOT DETECTED NOT DETECTED Final   Respiratory Syncytial Virus NOT DETECTED NOT DETECTED Final   Bordetella pertussis NOT DETECTED NOT DETECTED Final   Chlamydophila pneumoniae NOT DETECTED NOT DETECTED Final   Mycoplasma pneumoniae NOT DETECTED NOT DETECTED Final     Radiology Studies: Dg Chest 2 View  Result Date: 04/10/2018 CLINICAL DATA:  Asthma, COPD, wheezing, shortness of breath. EXAM: CHEST - 2 VIEW COMPARISON:  02/16/2018 FINDINGS: The heart size and mediastinal contours are within normal limits. Both lungs are clear. The visualized skeletal structures are unremarkable. IMPRESSION: No active cardiopulmonary disease. Electronically Signed   By: Burman NievesWilliam  Stevens M.D.   On: 04/10/2018 02:33    Scheduled Meds: . arformoterol  15 mcg Nebulization BID  . budesonide (PULMICORT) nebulizer solution  0.5 mg Nebulization BID  . enoxaparin (LOVENOX) injection  40 mg Subcutaneous Q24H  . guaiFENesin  600 mg Oral BID  . ipratropium-albuterol  3 mL Nebulization QID  . montelukast  10 mg Oral Daily   Continuous Infusions: . azithromycin 500 mg (04/11/18 0605)     LOS: 0 days   Rickey BarbaraStephen Argenis Kumari, MD [facility] Pager On Amion  If 7PM-7AM, please contact  night-coverage 04/11/2018, 5:20 PM

## 2018-04-12 MED ORDER — METHYLPREDNISOLONE SODIUM SUCC 125 MG IJ SOLR: 60.0000 mg | Freq: Two times a day (BID) | INTRAMUSCULAR | Status: DC

## 2018-04-12 MED ORDER — METHYLPREDNISOLONE SODIUM SUCC 40 MG IJ SOLR
40.0000 mg | Freq: Four times a day (QID) | INTRAMUSCULAR | Status: DC
  Administered 2018-04-12 – 2018-04-14 (×9): 40 mg via INTRAVENOUS
  Filled 2018-04-12 (×9): qty 1

## 2018-04-12 MED ORDER — AZITHROMYCIN 250 MG PO TABS
500.0000 mg | ORAL_TABLET | Freq: Every day | ORAL | Status: DC
  Administered 2018-04-13 – 2018-04-14 (×2): 500 mg via ORAL
  Filled 2018-04-12 (×2): qty 2

## 2018-04-12 NOTE — Progress Notes (Signed)
PROGRESS NOTE    Florina OuMary J XXXSanders  MWN:[id number] DOB: 1955-12-30 DOA: 04/10/2018 PCP: Verlon AuBoyd, Tammy Lamonica, MD    Brief Narrative:  62 y.o. 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  She reports having a productive cough with thick greenish sputum production and wheezing.  She tried utilizing her inhalers and nebulizer machine without relief of symptoms.  Feels symptoms were brought on with the recent changes in the weather.  Denies having any fever, nausea, vomiting, diarrhea, headache, seizure activity, or myalgias.  Associated symptoms include centralized chest tightness and discomfort with coughing.  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  Review of records show spirometry readings from 10/2017 with pulmonology show a mixed picture of COPD and asthma.  She has had 3 prior admissions into the hospital for COPD over this year.  Patient also notes previous history of requiring intubation due to symptoms back in 2003.   ED Course: Upon admission patient was noted to be afebrile with vital signs relatively within normal limits with patient currently on 2 L of nasal cannula oxygen.  Chest x-ray showed no acute abnormalities.  Patient was given 125 mg of Solu-Medrol IV, 2 g of magnesium sulfate IV, 1 albuterol treatment and 1 continuous hour-long nebulizer treatment without relief of symptoms.  Assessment & Plan:   Principal Problem:   Asthma exacerbation with COPD (chronic obstructive pulmonary disease) (HCC) Active Problems:   Asthma exacerbation   Hypokalemia  Mixed asthma/COPD exacerbation: Acute.   -Patient presents with productive cough, shortness of breath, and wheezing.   -Chest x-ray showing no acute abnormality.  Patient  with recent spirometry in 10/2017 showing severe airway obstruction with low vital capacity. Patient normally on 2 L of nasal cannula oxygen only at night. - Still with marked wheezing this AM and increased O2 dependency over baseline - continue on solumedrol 40mg  q6hrs - Duonebs 4 times daily with q2hrs PRN - Brovana and budesonide nebs to replace Symbicort inhaler - On empiric azithromycin given acuity of illness - Resp viral panel pos for Coronavirus  Hypokalemia: Acute. Initial potassium noted with 3.4 on admission. - Replaced  Diastolic CHF: Patient appears to be euvolemic at this time.  Last echocardiogram from 06/2014 showed EF of 60 to 65% with grade 2 diastolic dysfunction. -remains euvolemic  History of seizures: Patient does not report any recent seizures and is not on any antiepileptic medications. -remains seizure freethus far  DVT prophylaxis: Lovenox subQ Code Status: Full Family Communication: Pt in room, family not at bedside Disposition Plan: Uncertain at this time  Consultants:     Procedures:     Antimicrobials: Anti-infectives (From admission, onward)   Start     Dose/Rate Route Frequency Ordered Stop   04/13/18 1000  azithromycin (ZITHROMAX) tablet 500 mg     500 mg Oral Daily 04/12/18 1256     04/10/18 0515  azithromycin (ZITHROMAX) 500 mg in sodium chloride 0.9 % 250 mL IVPB  Status:  Discontinued     500 mg 250 mL/hr over 60 Minutes Intravenous Daily 04/10/18 0511 04/12/18 1256      Subjective: Complaining of wheezing, sob when off o2  Objective: Vitals:   04/12/18 0605 04/12/18 0829 04/12/18 0833 04/12/18 1345  BP:    123/64  Pulse:  91  Resp:    16  Temp:    98.4 F (36.9 C)  TempSrc:    Oral  SpO2: 98% 97% 97% 96%  Weight:      Height:        Intake/Output Summary (Last 24 hours) at 04/12/2018 1703 Last data filed at 04/12/2018 1000 Gross per 24 hour  Intake 740 ml  Output 6 ml  Net 734 ml   Filed Weights   04/10/18 0836   Weight: 89.8 kg    Examination: General exam: Awake, laying in bed, in nad Respiratory system: increased resp effort, audible wheezing from across the room still Cardiovascular system: regular rate, s1, s2  Data Reviewed: I have personally reviewed following labs and imaging studies  CBC: Recent Labs  Lab 04/10/18 0238 04/11/18 0621  WBC 5.9 13.2*  NEUTROABS 2.6  --   HGB 12.3 13.0  HCT 41.4 44.2  MCV 96.1 94.0  PLT 222 263   Basic Metabolic Panel: Recent Labs  Lab 04/10/18 0238 04/11/18 0621  NA 141 143  K 3.4* 4.8  CL 103 106  CO2 30 26  GLUCOSE 111* 146*  BUN 8 9  CREATININE 0.61 0.64  CALCIUM 9.4 10.4*   GFR: Estimated Creatinine Clearance: 76.4 mL/min (by C-G formula based on SCr of 0.64 mg/dL). Liver Function Tests: No results for input(s): AST, ALT, ALKPHOS, BILITOT, PROT, ALBUMIN in the last 168 hours. No results for input(s): LIPASE, AMYLASE in the last 168 hours. No results for input(s): AMMONIA in the last 168 hours. Coagulation Profile: No results for input(s): INR, PROTIME in the last 168 hours. Cardiac Enzymes: No results for input(s): CKTOTAL, CKMB, CKMBINDEX, TROPONINI in the last 168 hours. BNP (last 3 results) No results for input(s): PROBNP in the last 8760 hours. HbA1C: No results for input(s): HGBA1C in the last 72 hours. CBG: No results for input(s): GLUCAP in the last 168 hours. Lipid Profile: No results for input(s): CHOL, HDL, LDLCALC, TRIG, CHOLHDL, LDLDIRECT in the last 72 hours. Thyroid Function Tests: No results for input(s): TSH, T4TOTAL, FREET4, T3FREE, THYROIDAB in the last 72 hours. Anemia Panel: No results for input(s): VITAMINB12, FOLATE, FERRITIN, TIBC, IRON, RETICCTPCT in the last 72 hours. Sepsis Labs: No results for input(s): PROCALCITON, LATICACIDVEN in the last 168 hours.  Recent Results (from the past 240 hour(s))  Respiratory Panel by PCR     Status: Abnormal   Collection Time: 04/10/18  9:31 AM  Result Value  Ref Range Status   Adenovirus NOT DETECTED NOT DETECTED Final   Coronavirus 229E NOT DETECTED NOT DETECTED Final   Coronavirus HKU1 DETECTED (A) NOT DETECTED Final   Coronavirus NL63 NOT DETECTED NOT DETECTED Final   Coronavirus OC43 NOT DETECTED NOT DETECTED Final   Metapneumovirus NOT DETECTED NOT DETECTED Final   Rhinovirus / Enterovirus NOT DETECTED NOT DETECTED Final   Influenza A NOT DETECTED NOT DETECTED Final   Influenza B NOT DETECTED NOT DETECTED Final   Parainfluenza Virus 1 NOT DETECTED NOT DETECTED Final   Parainfluenza Virus 2 NOT DETECTED NOT DETECTED Final   Parainfluenza Virus 3 NOT DETECTED NOT DETECTED Final   Parainfluenza Virus 4 NOT DETECTED NOT DETECTED Final   Respiratory Syncytial Virus NOT DETECTED NOT DETECTED Final   Bordetella pertussis NOT DETECTED NOT DETECTED Final   Chlamydophila pneumoniae NOT DETECTED NOT DETECTED Final   Mycoplasma pneumoniae NOT DETECTED NOT DETECTED Final     Radiology Studies: No results found.  Scheduled Meds: . arformoterol  15  mcg Nebulization BID  . [START ON 04/13/2018] azithromycin  500 mg Oral Daily  . budesonide (PULMICORT) nebulizer solution  0.5 mg Nebulization BID  . enoxaparin (LOVENOX) injection  40 mg Subcutaneous Q24H  . guaiFENesin  600 mg Oral BID  . ipratropium-albuterol  3 mL Nebulization QID  . methylPREDNISolone (SOLU-MEDROL) injection  40 mg Intravenous Q6H  . montelukast  10 mg Oral Daily   Continuous Infusions:    LOS: 1 day   Rickey BarbaraStephen Chiu, MD [facility] Pager On Amion  If 7PM-7AM, please contact night-coverage 04/12/2018, 5:03 PM

## 2018-04-12 NOTE — Progress Notes (Signed)
PHARMACIST - PHYSICIAN COMMUNICATION  CONCERNING: Antibiotic IV to Oral Route Change Policy  RECOMMENDATION: This patient is receiving azithromycin by the intravenous route.  Based on criteria approved by the Pharmacy and Therapeutics Committee, the antibiotic(s) is/are being converted to the equivalent oral dose form(s).   DESCRIPTION: These criteria include:  Patient being treated for a respiratory tract infection, urinary tract infection, cellulitis or clostridium difficile associated diarrhea if on metronidazole  The patient is not neutropenic and does not exhibit a GI malabsorption state  The patient is eating (either orally or via tube) and/or has been taking other orally administered medications for a least 24 hours  The patient is improving clinically and has a Tmax < 100.5  If you have questions about this conversion, please contact the Pharmacy Department  []   [phone]( [phone] )  [facility] []   [phone]( [phone] )  [facility] []   [phone]( [phone] )  [facility] []   [phone]  [facility] [x]   [phone]( [phone] )  [facility]  Herby AbrahamMichelle T. Tita Terhaar, [place]D 04/12/2018 12:56 PM

## 2018-04-13 MED ORDER — IPRATROPIUM-ALBUTEROL 0.5-2.5 (3) MG/3ML IN SOLN
3.0000 mL | RESPIRATORY_TRACT | Status: DC
  Administered 2018-04-13 – 2018-04-14 (×7): 3 mL via RESPIRATORY_TRACT
  Filled 2018-04-13 (×7): qty 3

## 2018-04-13 NOTE — Progress Notes (Signed)
PROGRESS NOTE    Sonia Drake  Drake DOB: 03/27/1956 DOA: 04/10/2018 PCP: Verlon AuBoyd, Tammy Lamonica, MD    Brief Narrative:  62 y.o. 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  She reports having a productive cough with thick greenish sputum production and wheezing.  She tried utilizing her inhalers and nebulizer machine without relief of symptoms.  Feels symptoms were brought on with the recent changes in the weather.  Denies having any fever, nausea, vomiting, diarrhea, headache, seizure activity, or myalgias.  Associated symptoms include centralized chest tightness and discomfort with coughing.  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  Review of records show spirometry readings from 10/2017 with pulmonology show a mixed picture of COPD and asthma.  She has had 3 prior admissions into the hospital for COPD over this year.  Patient also notes previous history of requiring intubation due to symptoms back in 2003.   ED Course: Upon admission patient was noted to be afebrile with vital signs relatively within normal limits with patient currently on 2 L of nasal cannula oxygen.  Chest x-ray showed no acute abnormalities.  Patient was given 125 mg of Solu-Medrol IV, 2 g of magnesium sulfate IV, 1 albuterol treatment and 1 continuous hour-long nebulizer treatment without relief of symptoms.  Assessment & Plan:   Principal Problem:   Asthma exacerbation with COPD (chronic obstructive pulmonary disease) (HCC) Active Problems:   Asthma exacerbation   Hypokalemia  Mixed asthma/COPD exacerbation: Acute.   -Patient presents with productive cough, shortness of breath, and wheezing.   -Chest x-ray showing no acute abnormality.  Patient  with recent spirometry in 10/2017 showing severe airway obstruction with low vital capacity. Patient normally on 2 L of nasal cannula oxygen only at night. - Brovana and budesonide nebs to replace Symbicort inhaler - On empiric azithromycin given acuity of illness - Resp viral panel pos for Coronavirus -Still with wheezing and increased O2 requirements, albeit improved from yesterday. Increase neb tx to q4hrs with q2hr PRN  Hypokalemia: Acute. Initial potassium noted with 3.4 on admission. - Replaced  Diastolic CHF: Patient appears to be euvolemic at this time.  Last echocardiogram from 06/2014 showed EF of 60 to 65% with grade 2 diastolic dysfunction. -remains euvolemic  History of seizures: Patient does not report any recent seizures and is not on any antiepileptic medications. -remains seizure freethus far  DVT prophylaxis: Lovenox subQ Code Status: Full Family Communication: Pt in room, family not at bedside Disposition Plan: Uncertain at this time  Consultants:     Procedures:     Antimicrobials: Anti-infectives (From admission, onward)   Start     Dose/Rate Route Frequency Ordered Stop   04/13/18 1000  azithromycin (ZITHROMAX) tablet 500 mg     500 mg Oral Daily 04/12/18 1256     04/10/18 0515  azithromycin (ZITHROMAX) 500 mg in sodium chloride 0.9 % 250 mL IVPB  Status:  Discontinued     500 mg 250 mL/hr over 60 Minutes Intravenous Daily 04/10/18 0511 04/12/18 1256      Subjective: Still wheezing but eager to go home  Objective: Vitals:   04/13/18 0537 04/13/18 0815 04/13/18 0819 04/13/18 1353  BP: 135/82   (!) 141/79  Pulse: 85   100  Resp: 18  18  Temp: 98.7 F (37.1 C)     TempSrc: Oral     SpO2: 97% 96% 96% 94%  Weight:      Height:        Intake/Output Summary (Last 24 hours) at 04/13/2018 1720 Last data filed at 04/13/2018 0943 Gross per 24 hour  Intake 480 ml  Output -  Net 480 ml   Filed Weights   04/10/18 0836  Weight: 89.8 kg     Examination: General exam: Conversant, in no acute distress Respiratory system: normal chest rise, clear, wheezing in all lung fields  Data Reviewed: I have personally reviewed following labs and imaging studies  CBC: Recent Labs  Lab 04/10/18 0238 04/11/18 0621  WBC 5.9 13.2*  NEUTROABS 2.6  --   HGB 12.3 13.0  HCT 41.4 44.2  MCV 96.1 94.0  PLT 222 263   Basic Metabolic Panel: Recent Labs  Lab 04/10/18 0238 04/11/18 0621  NA 141 143  K 3.4* 4.8  CL 103 106  CO2 30 26  GLUCOSE 111* 146*  BUN 8 9  CREATININE 0.61 0.64  CALCIUM 9.4 10.4*   GFR: Estimated Creatinine Clearance: 76.4 mL/min (by C-G formula based on SCr of 0.64 mg/dL). Liver Function Tests: No results for input(s): AST, ALT, ALKPHOS, BILITOT, PROT, ALBUMIN in the last 168 hours. No results for input(s): LIPASE, AMYLASE in the last 168 hours. No results for input(s): AMMONIA in the last 168 hours. Coagulation Profile: No results for input(s): INR, PROTIME in the last 168 hours. Cardiac Enzymes: No results for input(s): CKTOTAL, CKMB, CKMBINDEX, TROPONINI in the last 168 hours. BNP (last 3 results) No results for input(s): PROBNP in the last 8760 hours. HbA1C: No results for input(s): HGBA1C in the last 72 hours. CBG: No results for input(s): GLUCAP in the last 168 hours. Lipid Profile: No results for input(s): CHOL, HDL, LDLCALC, TRIG, CHOLHDL, LDLDIRECT in the last 72 hours. Thyroid Function Tests: No results for input(s): TSH, T4TOTAL, FREET4, T3FREE, THYROIDAB in the last 72 hours. Anemia Panel: No results for input(s): VITAMINB12, FOLATE, FERRITIN, TIBC, IRON, RETICCTPCT in the last 72 hours. Sepsis Labs: No results for input(s): PROCALCITON, LATICACIDVEN in the last 168 hours.  Recent Results (from the past 240 hour(s))  Respiratory Panel by PCR     Status: Abnormal   Collection Time: 04/10/18  9:31 AM  Result Value Ref Range Status   Adenovirus NOT DETECTED NOT DETECTED Final    Coronavirus 229E NOT DETECTED NOT DETECTED Final   Coronavirus HKU1 DETECTED (A) NOT DETECTED Final   Coronavirus NL63 NOT DETECTED NOT DETECTED Final   Coronavirus OC43 NOT DETECTED NOT DETECTED Final   Metapneumovirus NOT DETECTED NOT DETECTED Final   Rhinovirus / Enterovirus NOT DETECTED NOT DETECTED Final   Influenza A NOT DETECTED NOT DETECTED Final   Influenza B NOT DETECTED NOT DETECTED Final   Parainfluenza Virus 1 NOT DETECTED NOT DETECTED Final   Parainfluenza Virus 2 NOT DETECTED NOT DETECTED Final   Parainfluenza Virus 3 NOT DETECTED NOT DETECTED Final   Parainfluenza Virus 4 NOT DETECTED NOT DETECTED Final   Respiratory Syncytial Virus NOT DETECTED NOT DETECTED Final   Bordetella pertussis NOT DETECTED NOT DETECTED Final   Chlamydophila pneumoniae NOT DETECTED NOT DETECTED Final   Mycoplasma pneumoniae NOT DETECTED NOT DETECTED Final     Radiology Studies: No results found.  Scheduled Meds: . arformoterol  15 mcg Nebulization BID  . azithromycin  500 mg Oral Daily  . budesonide (PULMICORT)  nebulizer solution  0.5 mg Nebulization BID  . enoxaparin (LOVENOX) injection  40 mg Subcutaneous Q24H  . guaiFENesin  600 mg Oral BID  . ipratropium-albuterol  3 mL Nebulization Q4H  . methylPREDNISolone (SOLU-MEDROL) injection  40 mg Intravenous Q6H  . montelukast  10 mg Oral Daily   Continuous Infusions:    LOS: 2 days   Rickey Barbara, MD [facility] Pager On Amion  If 7PM-7AM, please contact night-coverage 04/13/2018, 5:20 PM

## 2018-04-13 NOTE — Progress Notes (Signed)
Pt became very agitated this evening when RN brought night time meds. Pt stated that she does not need to be taking mucinex because it "personally does not help" her. She said she will take the mucinex because the MD prescribed it but it is not helping her "in any way at all". Pt also stated in front of RN and NT that she is getting frustrated because she is not doing better like she thinks she should be.

## 2018-04-14 DIAGNOSIS — J441 Chronic obstructive pulmonary disease with (acute) exacerbation: Secondary | ICD-10-CM

## 2018-04-14 DIAGNOSIS — J45901 Unspecified asthma with (acute) exacerbation: Secondary | ICD-10-CM

## 2018-04-14 MED ORDER — MONTELUKAST SODIUM 10 MG PO TABS: 10.0000 mg | ORAL_TABLET | Freq: Every day | ORAL | 2 refills | Status: AC

## 2018-04-14 MED ORDER — ALBUTEROL SULFATE (2.5 MG/3ML) 0.083% IN NEBU: 2.5000 mg | INHALATION_SOLUTION | Freq: Four times a day (QID) | RESPIRATORY_TRACT | 1 refills | Status: DC | PRN

## 2018-04-14 MED ORDER — ALBUTEROL SULFATE HFA 108 (90 BASE) MCG/ACT IN AERS: 2.0000 | INHALATION_SPRAY | Freq: Four times a day (QID) | RESPIRATORY_TRACT | 3 refills | Status: AC | PRN

## 2018-04-14 MED ORDER — PREDNISONE 20 MG PO TABS: 40.0000 mg | ORAL_TABLET | Freq: Every day | ORAL | 0 refills | Status: DC

## 2018-04-14 MED ORDER — GUAIFENESIN ER 600 MG PO TB12: 600.0000 mg | ORAL_TABLET | Freq: Two times a day (BID) | ORAL | 2 refills | Status: DC

## 2018-04-14 MED ORDER — BUDESONIDE-FORMOTEROL FUMARATE 160-4.5 MCG/ACT IN AERO: 2.0000 | INHALATION_SPRAY | Freq: Two times a day (BID) | RESPIRATORY_TRACT | 2 refills | Status: AC

## 2018-04-14 MED ORDER — TRIAMCINOLONE ACETONIDE 0.1 % EX LOTN: 1.0000 "application " | TOPICAL_LOTION | Freq: Two times a day (BID) | CUTANEOUS | 2 refills | Status: DC | PRN

## 2018-04-14 MED ORDER — CALCIUM CARBONATE-VITAMIN D 500-200 MG-UNIT PO TABS: 1.0000 | ORAL_TABLET | Freq: Every day | ORAL | 3 refills | Status: AC

## 2018-04-14 MED ORDER — TIOTROPIUM BROMIDE MONOHYDRATE 18 MCG IN CAPS: 18.0000 ug | ORAL_CAPSULE | Freq: Every day | RESPIRATORY_TRACT | 3 refills | Status: DC

## 2018-04-14 NOTE — Care Management Note (Signed)
Case Management Note  Patient Details  Name: Florina OuMary J XXXSanders MRN: [id number] Date of Birth: [birth year]  Subjective/Objective:                  Called pcp listed patient is no longer a patient due to no shows, instructions to call the [facility] on Thursday [id number] for an hospital follow up appointment Action/Plan: discharged to home  Expected Discharge Date:  04/14/18               Expected Discharge Plan:  Home/Self Care  In-House Referral:     Discharge planning Services  CM Consult, Follow-up appt scheduled  Post Acute Care Choice:    Choice offered to:  Patient  DME Arranged:    DME Agency:     HH Arranged:    HH Agency:     Status of Service:  Completed, signed off  If discussed at [employer] Length of Stay Meetings, dates discussed:    Additional Comments:  Golda AcreDavis, Rhonda Lynn, RN 04/14/2018, 10:44 AM

## 2018-04-14 NOTE — Progress Notes (Signed)
Sonia Drake to be D/C'd Home per MD order.  Discussed prescriptions and follow up appointments with the patient. Prescriptions given to patient, medication list explained in detail. Pt verbalized understanding.  Allergies as of 04/14/2018      Reactions   Contrast Media [iodinated Diagnostic Agents] Anaphylaxis, Other (See Comments)   ** CARDIAC ARREST **   Peanuts [peanut Oil] Shortness Of Breath, Swelling   Shellfish Allergy Shortness Of Breath, Swelling   Soap Shortness Of Breath, Swelling   LAUNDRY DETERGENT   Latex Hives   Eggs Or Egg-derived Products Rash      Medication List    TAKE these medications   albuterol 108 (90 Base) MCG/ACT inhaler Commonly known as:  PROVENTIL HFA;VENTOLIN HFA Inhale 2 puffs into the lungs every 6 (six) hours as needed for wheezing or shortness of breath. What changed:  how much to take   albuterol (2.5 MG/3ML) 0.083% nebulizer solution Commonly known as:  PROVENTIL Take 3 mLs (2.5 mg total) by nebulization every 6 (six) hours as needed for wheezing or shortness of breath. What changed:  Another medication with the same name was changed. Make sure you understand how and when to take each.   budesonide-formoterol 160-4.5 MCG/ACT inhaler Commonly known as:  SYMBICORT Inhale 2 puffs into the lungs 2 (two) times daily.   calcium-vitamin D 500-200 MG-UNIT tablet Commonly known as:  OSCAL WITH D Take 1 tablet by mouth daily with breakfast.   guaiFENesin 600 MG 12 hr tablet Commonly known as:  MUCINEX Take 1 tablet (600 mg total) by mouth 2 (two) times daily.   montelukast 10 MG tablet Commonly known as:  SINGULAIR Take 1 tablet (10 mg total) by mouth daily.   pantoprazole 20 MG tablet Commonly known as:  PROTONIX Take 20 mg by mouth daily as needed for indigestion.   predniSONE 20 MG tablet Commonly known as:  DELTASONE Take 2 tablets (40 mg total) by mouth daily with breakfast.   tiotropium 18 MCG inhalation capsule Commonly  known as:  SPIRIVA Place 1 capsule (18 mcg total) into inhaler and inhale daily.   triamcinolone lotion 0.1 % Commonly known as:  KENALOG Apply 1 application topically 2 (two) times daily as needed (for rash).       Vitals:   04/14/18 0814 04/14/18 0830  BP:    Pulse:    Resp:    Temp:    SpO2: (!) 89% 90%    Skin clean, dry and intact without evidence of skin break down, no evidence of skin tears noted. IV catheter discontinued intact. Site without signs and symptoms of complications. Dressing and pressure applied. Pt denies pain at this time. No complaints noted.  An After Visit Summary was printed and given to the patient. Patient escorted via WC, and D/C home via private auto.  Viviana SimplerDumas, Broox Lonigro S 04/14/2018 11:07 AM

## 2018-04-14 NOTE — Discharge Instructions (Signed)
1)You have a  viral respiratory infection with a flareup of your asthma-----take medications including prednisone as prescribed 2) continue to use oxygen and breathing treatments as advised 3) avoid dehydration, drink enough fluids to maintain adequate hydration 4) follow-up with your primary care physician in 5 to 7 days for recheck and reevaluation and call or return sooner if your symptoms worsen

## 2018-04-14 NOTE — Discharge Summary (Signed)
Sonia Drake, is a 62 y.o. female  DOB 1956-03-27  MRN [id number].  Admission date:  04/10/2018  Admitting Physician  Clydie Braunondell A Smith, MD  Discharge Date:  04/14/2018   Primary MD  Verlon AuBoyd, Tammy Lamonica, MD  Recommendations for primary care physician for things to follow:   1)You have a  viral respiratory infection with a flareup of your asthma-----take medications including prednisone as prescribed 2) continue to use oxygen and breathing treatments as advised 3) avoid dehydration, drink enough fluids to maintain adequate hydration 4) follow-up with your primary care physician in 5 to 7 days for recheck and reevaluation and call or return sooner if your symptoms worsen   Admission Diagnosis  Severe persistent asthma with exacerbation [J45.51]   Discharge Diagnosis  Severe persistent asthma with exacerbation [J45.51]    Principal Problem:   Asthma exacerbation with COPD (chronic obstructive pulmonary disease) (HCC) Active Problems:   Asthma exacerbation   Hypokalemia      Past Medical History:  Diagnosis Date  . Arthritis    "right knee; left hip" (04/07/2017)  . Asthma   . CHF (congestive heart failure) (HCC)    reported treatment for fluid overload in the setting of asthma exacerbation ~ 1998  . COPD (chronic obstructive pulmonary disease) (HCC)    patient states she does not have COPD but was treated for it.   . 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 Dyspnea   . Dysrhythmia    occasional skipped beats  . GERD (gastroesophageal reflux disease)   . On home oxygen therapy    "I sleep with 2L" (04/07/2017)  . Pneumonia    "several times" (04/07/2017)  . Seizures (HCC) 2003 X 1   unknown cause    Past Surgical History:  Procedure Laterality Date  . JOINT REPLACEMENT    . KNEE ARTHROSCOPY Right 2017  . 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 TOTAL HIP  ARTHROPLASTY Left 04/07/2017   Procedure: LEFT TOTAL HIP ARTHROPLASTY ANTERIOR APPROACH;  Surgeon: Gean Birchwoodowan, Frank, MD;  Location: [facility] OR;  Service: Orthopedics;  Laterality: Left;       HPI  from the history and physical done on the day of admission:    HPI: Sonia Drake is a 62 y.o. 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  She reports having a productive cough with thick greenish sputum production and wheezing.  She tried utilizing her inhalers and nebulizer machine without relief of symptoms.  Feels symptoms were brought on with the recent changes in the weather.  Denies having any fever, nausea, vomiting, diarrhea, headache, seizure activity, or myalgias.  Associated symptoms include centralized chest tightness and discomfort with coughing.  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  Review of records show spirometry readings from  10/2017 with pulmonology show a mixed picture of COPD and asthma.  She has had 3 prior admissions into the hospital for COPD over this year.  Patient also notes previous history of requiring intubation due to symptoms back in 2003.   ED Course: Upon admission patient was noted to be afebrile with vital signs relatively within normal limits with patient currently on 2 L of nasal cannula oxygen.  Chest x-ray showed no acute abnormalities.  Patient was given 125 mg of Solu-Medrol IV, 2 g of magnesium sulfate IV, 1 albuterol treatment and 1 continuous hour-long nebulizer treatment without relief of symptoms.     Hospital Course:       Asthma exacerbation with COPD (chronic obstructive pulmonary disease) (HCC) Active Problems:   Asthma exacerbation   Hypokalemia  1)Mixed asthma/COPD exacerbation: Acute.   -Patient presented with productive cough, shortness of breath, and wheezing.  -Chest x-ray showing no acute abnormality. Patient with recent spirometry in 10/2017 showing severe airway obstruction with low vital capacity. Patient normally on 2 L of nasal cannula oxygen only at night. -Resp viral panel pos for Coronavirus, improved significantly with steroids and bronchodilators, discharged on same... Patient respiratory status is much back to baseline, she has home oxygen at home, oxygen requirement and respiratory status as per patient is no longer different from her baseline,  2)Diastolic CHF: Patient appears to be euvolemic at this time. Last echocardiogram from 06/2014 showed EF of 60 to 65% with grade 2 diastolic dysfunction. -remains euvolemic  3)History of seizures: Patient does not report any recent seizures and is not on any antiepileptic medications. -remains seizure freethus far  Discharge Condition: Stable  Follow UP-PCP   Diet and Activity recommendation:  As advised  Discharge Instructions    Discharge Instructions    Call MD for:  difficulty breathing, headache or visual disturbances   Complete by:  As directed    Call MD for:  persistant dizziness or light-headedness   Complete by:  As directed    Call MD for:  persistant nausea and vomiting   Complete by:  As directed    Call MD for:  severe uncontrolled pain   Complete by:  As directed    Call MD for:  temperature >100.4   Complete by:  As directed    Diet - low sodium heart healthy   Complete by:  As directed    Discharge instructions   Complete by:  As directed    1)You have a  viral respiratory infection with a flareup of your asthma-----take medications including prednisone as prescribed 2) continue to use oxygen and breathing treatments as advised 3) avoid dehydration, drink enough fluids to maintain adequate hydration 4) follow-up with your primary care physician in 5 to 7 days for recheck and  reevaluation and call or return sooner if your symptoms worsen   Increase activity slowly   Complete by:  As directed         Discharge Medications     Allergies as of 04/14/2018      Reactions   Contrast Media [iodinated Diagnostic Agents] Anaphylaxis, Other (See Comments)   ** CARDIAC ARREST **   Peanuts [peanut Oil] Shortness Of Breath, Swelling   Shellfish Allergy Shortness Of Breath, Swelling   Soap Shortness Of Breath, Swelling   LAUNDRY DETERGENT   Latex Hives   Eggs Or Egg-derived Products Rash      Medication List    TAKE these medications   albuterol 108 (90 Base) MCG/ACT inhaler  Commonly known as:  PROVENTIL HFA;VENTOLIN HFA Inhale 2 puffs into the lungs every 6 (six) hours as needed for wheezing or shortness of breath. What changed:  how much to take   albuterol (2.5 MG/3ML) 0.083% nebulizer solution Commonly known as:  PROVENTIL Take 3 mLs (2.5 mg total) by nebulization every 6 (six) hours as needed for wheezing or shortness of breath. What changed:  Another medication with the same name was changed. Make sure you understand how and when to take each.   budesonide-formoterol 160-4.5 MCG/ACT inhaler Commonly known as:  SYMBICORT Inhale 2 puffs into the lungs 2 (two) times daily.   calcium-vitamin D 500-200 MG-UNIT tablet Commonly known as:  OSCAL WITH D Take 1 tablet by mouth daily with breakfast.   guaiFENesin 600 MG 12 hr tablet Commonly known as:  MUCINEX Take 1 tablet (600 mg total) by mouth 2 (two) times daily.   montelukast 10 MG tablet Commonly known as:  SINGULAIR Take 1 tablet (10 mg total) by mouth daily.   pantoprazole 20 MG tablet Commonly known as:  PROTONIX Take 20 mg by mouth daily as needed for indigestion.   predniSONE 20 MG tablet Commonly known as:  DELTASONE Take 2 tablets (40 mg total) by mouth daily with breakfast.   tiotropium 18 MCG inhalation capsule Commonly known as:  SPIRIVA Place 1 capsule (18 mcg total) into  inhaler and inhale daily.   triamcinolone lotion 0.1 % Commonly known as:  KENALOG Apply 1 application topically 2 (two) times daily as needed (for rash).       Major procedures and Radiology Reports - PLEASE review detailed and final reports for all details, in brief -    Dg Chest 2 View  Result Date: 04/10/2018 CLINICAL DATA:  Asthma, COPD, wheezing, shortness of breath. EXAM: CHEST - 2 VIEW COMPARISON:  02/16/2018 FINDINGS: The heart size and mediastinal contours are within normal limits. Both lungs are clear. The visualized skeletal structures are unremarkable. IMPRESSION: No active cardiopulmonary disease. Electronically Signed   By: Burman NievesWilliam  Stevens M.D.   On: 04/10/2018 02:33    Micro Results    Recent Results (from the past 240 hour(s))  Respiratory Panel by PCR     Status: Abnormal   Collection Time: 04/10/18  9:31 AM  Result Value Ref Range Status   Adenovirus NOT DETECTED NOT DETECTED Final   Coronavirus 229E NOT DETECTED NOT DETECTED Final   Coronavirus HKU1 DETECTED (A) NOT DETECTED Final   Coronavirus NL63 NOT DETECTED NOT DETECTED Final   Coronavirus OC43 NOT DETECTED NOT DETECTED Final   Metapneumovirus NOT DETECTED NOT DETECTED Final   Rhinovirus / Enterovirus NOT DETECTED NOT DETECTED Final   Influenza A NOT DETECTED NOT DETECTED Final   Influenza B NOT DETECTED NOT DETECTED Final   Parainfluenza Virus 1 NOT DETECTED NOT DETECTED Final   Parainfluenza Virus 2 NOT DETECTED NOT DETECTED Final   Parainfluenza Virus 3 NOT DETECTED NOT DETECTED Final   Parainfluenza Virus 4 NOT DETECTED NOT DETECTED Final   Respiratory Syncytial Virus NOT DETECTED NOT DETECTED Final   Bordetella pertussis NOT DETECTED NOT DETECTED Final   Chlamydophila pneumoniae NOT DETECTED NOT DETECTED Final   Mycoplasma pneumoniae NOT DETECTED NOT DETECTED Final       Today   Subjective    Sonia Drake today has no complaints, Patient states that she feels like she is back to  baseline, she still has some cough and occasional wheezing but oxygen requirement is back to baseline overall  she does not feel any more short of breath than usual at this time          Patient has been seen and examined prior to discharge   Objective   Blood pressure 120/64, pulse 90, temperature 97.9 F (36.6 C), temperature source Oral, resp. rate 17, height 5' 3.5" (1.613 m), weight 89.8 kg, SpO2 90 %.   Intake/Output Summary (Last 24 hours) at 04/14/2018 1005 Last data filed at 04/13/2018 1817 Gross per 24 hour  Intake 480 ml  Output -  Net 480 ml    Exam Gen:- Awake Alert, no acute distress , able to speak in complete sentences HEENT:- [place].AT, No sclera icterus Nose- [place] 2 L/min Neck-Supple Neck,No JVD,.  Lungs-fair symmetrical air movement, no wheezing at this time  CV- S1, S2 normal, regular Abd-  +ve B.Sounds, Abd Soft, No tenderness,    Extremity/Skin:- No  edema,   good pulses Psych-affect is appropriate, oriented x3 Neuro-no new focal deficits, no tremors    Data Review   CBC w Diff:  Lab Results  Component Value Date   WBC 13.2 (H) 04/11/2018   HGB 13.0 04/11/2018   HCT 44.2 04/11/2018   PLT 263 04/11/2018   LYMPHOPCT 36 04/10/2018   MONOPCT 9 04/10/2018   EOSPCT 11 04/10/2018   BASOPCT 1 04/10/2018    CMP:  Lab Results  Component Value Date   NA 143 04/11/2018   K 4.8 04/11/2018   CL 106 04/11/2018   CO2 26 04/11/2018   BUN 9 04/11/2018   CREATININE 0.64 04/11/2018   PROT 7.1 02/17/2018   ALBUMIN 3.4 (L) 02/17/2018   BILITOT 0.4 02/17/2018   ALKPHOS 78 02/17/2018   AST 17 02/17/2018   ALT 17 02/17/2018  .   Total Discharge time is about 33 minutes  Shon Hale M.D on 04/14/2018 at 10:05 AM  Pager---[phone]  Go to [email or web address] - password [email or web address] for contact info  [facility] - Office  [phone]

## 2018-09-18 ENCOUNTER — Telehealth: Payer: Self-pay

## 2018-09-18 NOTE — Telephone Encounter (Signed)
Late entry for 28MAY2020   [facility] Clinical Trial  Today, 28MAY2020, A call was placed to Mrs. Luanna Cole. Allyne Gee, the purpose of this call was to inform them of the [employer] clinical trial and let them know that they were potentially eligible for participation. I explained what the patient could expect should they choose to participate in the trial. I informed them of their potential responsibilities for the trial.    The patient expressed interest in study participation, and was agreeable to having the informed consent document sent to them for their review. The patient was instructed to review that document and should they have any questions to not hesitate to contact me back. If they agree after review of the document then an appointment would be made for them to come to the clinic for study procedures.  Email was sent out to this patient at the email provided.   Tobi Bastos BS, CCRC, CPhT [place], [place] [phone]

## 2019-01-25 ENCOUNTER — Other Ambulatory Visit: Payer: Self-pay

## 2019-01-25 ENCOUNTER — Encounter (HOSPITAL_COMMUNITY): Payer: Self-pay

## 2019-01-25 ENCOUNTER — Emergency Department (HOSPITAL_COMMUNITY): Payer: Medicare Other

## 2019-01-25 ENCOUNTER — Emergency Department (HOSPITAL_COMMUNITY)
Admission: EM | Admit: 2019-01-25 | Discharge: 2019-01-25 | Disposition: A | Discharge status: Home / Self Care | Payer: Medicare Other | Source: Home / Self Care | Attending: Emergency Medicine | Admitting: Emergency Medicine

## 2019-01-25 DIAGNOSIS — I5032 Chronic diastolic (congestive) heart failure: Secondary | ICD-10-CM | POA: Insufficient documentation

## 2019-01-25 DIAGNOSIS — Z91012 Allergy to eggs: Secondary | ICD-10-CM | POA: Insufficient documentation

## 2019-01-25 DIAGNOSIS — J441 Chronic obstructive pulmonary disease with (acute) exacerbation: Secondary | ICD-10-CM

## 2019-01-25 DIAGNOSIS — Z20828 Contact with and (suspected) exposure to other viral communicable diseases: Secondary | ICD-10-CM | POA: Insufficient documentation

## 2019-01-25 DIAGNOSIS — Z79899 Other long term (current) drug therapy: Secondary | ICD-10-CM | POA: Insufficient documentation

## 2019-01-25 DIAGNOSIS — R0602 Shortness of breath: Secondary | ICD-10-CM | POA: Diagnosis not present

## 2019-01-25 DIAGNOSIS — Z91010 Allergy to peanuts: Secondary | ICD-10-CM | POA: Insufficient documentation

## 2019-01-25 DIAGNOSIS — Z91040 Latex allergy status: Secondary | ICD-10-CM | POA: Insufficient documentation

## 2019-01-25 DIAGNOSIS — Z91041 Radiographic dye allergy status: Secondary | ICD-10-CM | POA: Insufficient documentation

## 2019-01-25 DIAGNOSIS — Z91013 Allergy to seafood: Secondary | ICD-10-CM | POA: Insufficient documentation

## 2019-01-25 LAB — CBC WITH DIFFERENTIAL/PLATELET
Abs Immature Granulocytes: 0.03 10*3/uL (ref 0.00–0.07)
Basophils Absolute: 0.1 10*3/uL (ref 0.0–0.1)
Basophils Relative: 1 %
Eosinophils Absolute: 0.4 10*3/uL (ref 0.0–0.5)
Eosinophils Relative: 5 %
HCT: 47.2 % — ABNORMAL HIGH (ref 36.0–46.0)
Hemoglobin: 14.2 g/dL (ref 12.0–15.0)
Immature Granulocytes: 0 %
Lymphocytes Relative: 18 %
Lymphs Abs: 1.3 10*3/uL (ref 0.7–4.0)
MCH: 28.6 pg (ref 26.0–34.0)
MCHC: 30.1 g/dL (ref 30.0–36.0)
MCV: 95 fL (ref 80.0–100.0)
Monocytes Absolute: 0.5 10*3/uL (ref 0.1–1.0)
Monocytes Relative: 6 %
Neutro Abs: 5.3 10*3/uL (ref 1.7–7.7)
Neutrophils Relative %: 70 %
Platelets: 232 10*3/uL (ref 150–400)
RBC: 4.97 MIL/uL (ref 3.87–5.11)
RDW: 14.9 % (ref 11.5–15.5)
WBC: 7.6 10*3/uL (ref 4.0–10.5)
nRBC: 0 % (ref 0.0–0.2)

## 2019-01-25 LAB — TROPONIN I (HIGH SENSITIVITY)
Troponin I (High Sensitivity): 4 ng/L (ref <18)
Troponin I (High Sensitivity): 4 ng/L (ref <18)

## 2019-01-25 LAB — COMPREHENSIVE METABOLIC PANEL
ALT: 22 U/L (ref 0–44)
AST: 29 U/L (ref 15–41)
Albumin: 4.2 g/dL (ref 3.5–5.0)
Alkaline Phosphatase: 93 U/L (ref 38–126)
Anion gap: 7 (ref 5–15)
BUN: 8 mg/dL (ref 8–23)
CO2: 29 mmol/L (ref 22–32)
Calcium: 9.7 mg/dL (ref 8.9–10.3)
Chloride: 104 mmol/L (ref 98–111)
Creatinine, Ser: 0.83 mg/dL (ref 0.44–1.00)
GFR calc Af Amer: >60 mL/min (ref >60)
GFR calc non Af Amer: >60 mL/min (ref >60)
Glucose, Bld: 122 mg/dL — ABNORMAL HIGH (ref 70–99)
Potassium: 3.6 mmol/L (ref 3.5–5.1)
Sodium: 140 mmol/L (ref 135–145)
Total Bilirubin: 0.6 mg/dL (ref 0.3–1.2)
Total Protein: 8.1 g/dL (ref 6.5–8.1)

## 2019-01-25 LAB — LACTIC ACID, PLASMA
Lactic Acid, Venous: 1.1 mmol/L (ref 0.5–1.9)
Lactic Acid, Venous: 1.3 mmol/L (ref 0.5–1.9)

## 2019-01-25 LAB — URINALYSIS, ROUTINE W REFLEX MICROSCOPIC
Bacteria, UA: NONE SEEN
Bilirubin Urine: NEGATIVE
Glucose, UA: NEGATIVE mg/dL
Hgb urine dipstick: NEGATIVE
Ketones, ur: NEGATIVE mg/dL
Nitrite: NEGATIVE
Protein, ur: NEGATIVE mg/dL
Specific Gravity, Urine: 1.026 (ref 1.005–1.030)
pH: 5 (ref 5.0–8.0)

## 2019-01-25 LAB — SARS CORONAVIRUS 2 BY RT PCR (HOSPITAL ORDER, PERFORMED IN ~~LOC~~ HOSPITAL LAB): SARS Coronavirus 2: NEGATIVE

## 2019-01-25 LAB — BRAIN NATRIURETIC PEPTIDE: B Natriuretic Peptide: 28.2 pg/mL (ref 0.0–100.0)

## 2019-01-25 MED ORDER — IPRATROPIUM BROMIDE HFA 17 MCG/ACT IN AERS: 2.0000 | INHALATION_SPRAY | RESPIRATORY_TRACT | Status: DC | PRN

## 2019-01-25 MED ORDER — IPRATROPIUM-ALBUTEROL 0.5-2.5 (3) MG/3ML IN SOLN
3.0000 mL | Freq: Once | RESPIRATORY_TRACT | Status: AC
  Administered 2019-01-25: 3 mL via RESPIRATORY_TRACT
  Filled 2019-01-25: qty 3

## 2019-01-25 MED ORDER — ALBUTEROL SULFATE HFA 108 (90 BASE) MCG/ACT IN AERS: 4.0000 | INHALATION_SPRAY | RESPIRATORY_TRACT | Status: DC | PRN

## 2019-01-25 MED ORDER — IPRATROPIUM BROMIDE HFA 17 MCG/ACT IN AERS
2.0000 | INHALATION_SPRAY | RESPIRATORY_TRACT | Status: DC
  Administered 2019-01-25: 2 via RESPIRATORY_TRACT
  Filled 2019-01-25: qty 12.9

## 2019-01-25 MED ORDER — ALBUTEROL SULFATE HFA 108 (90 BASE) MCG/ACT IN AERS
4.0000 | INHALATION_SPRAY | RESPIRATORY_TRACT | Status: DC
  Administered 2019-01-25: 4 via RESPIRATORY_TRACT
  Filled 2019-01-25: qty 6.7

## 2019-01-25 MED ORDER — METHYLPREDNISOLONE SODIUM SUCC 125 MG IJ SOLR
125.0000 mg | Freq: Once | INTRAMUSCULAR | Status: AC
  Administered 2019-01-25: 125 mg via INTRAVENOUS
  Filled 2019-01-25: qty 2

## 2019-01-25 MED ORDER — PREDNISONE 10 MG (21) PO TBPK: ORAL_TABLET | ORAL | 0 refills | Status: DC

## 2019-01-25 MED ORDER — DOXYCYCLINE HYCLATE 100 MG PO TABS: 100.0000 mg | ORAL_TABLET | Freq: Two times a day (BID) | ORAL | 0 refills | Status: DC

## 2019-01-25 NOTE — Discharge Instructions (Signed)
Take your medications as prescribed, follow-up with your doctor to make sure your symptoms are improving, return as needed for worsening symptoms.

## 2019-01-25 NOTE — ED Notes (Signed)
RT called for patient breathing treatment

## 2019-01-25 NOTE — ED Provider Notes (Addendum)
5:53 PM Patient initially seen by Dr. Vallery Ridge.  Please see her note.  Patient has improved with breathing treatments.  On repeat exam she still has some wheezing after 2 treatments.  Plan on the third treatment and we will decide whether or not she feels well enough to go home versus requiring admission.   Dorie Rank, MD 01/25/19 1753  Pt improved after last treatment.   Not requiring supplemental oxygen while eating although she does have that available at home.  DC home with doxycycline and pred taper   Dorie Rank, MD 01/25/19 1939

## 2019-01-25 NOTE — ED Notes (Addendum)
Pt ambulated to/from restroom with no complications. Pt back in bed and given 2 sandwiches and ginger ale at this time

## 2019-01-25 NOTE — ED Notes (Signed)
Pt placed on 2L [place]

## 2019-01-25 NOTE — ED Provider Notes (Addendum)
[facility]-EMERGENCY DEPT Provider Note   CSN: [id number] Arrival date & time: 01/25/19  1213     History   Chief Complaint Chief Complaint  Patient presents with  . Shortness of Breath  . Asthma    HPI Sonia Drake is a 63 y.o. female.     HPI Patient with known history of asthma.  She reports at baseline she is on 2 L of nasal cannula oxygen at home.  Patient reports for a couple of days now she has had increasing difficulty breathing and coughing.  No fever.  She reports her chest feels generally tight.  She has tried to maximize her use of her nebulizer at home.  She is also used her Spiriva.  She is not getting any improvement.  She reports she tried it just prior to coming to the emergency department and still felt like she could not catch her breath.  Patient denies she had any sick contacts.  She denies body aches, nausea or vomiting.  She reports she feels like this is her asthma.  No swelling of her legs or pain in her calves. Past Medical History:  Diagnosis Date  . Arthritis    "right knee; left hip" (04/07/2017)  . Asthma   . CHF (congestive heart failure) (HCC)    reported treatment for fluid overload in the setting of asthma exacerbation ~ 1998  . COPD (chronic obstructive pulmonary disease) (HCC)    patient states she does not have COPD but was treated for it.   . 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 Dyspnea   . Dysrhythmia    occasional skipped beats  . GERD (gastroesophageal reflux disease)   . On home oxygen therapy    "I sleep with 2L" (04/07/2017)  . Pneumonia    "several times" (04/07/2017)  . Seizures (HCC) 2003 X 1   unknown cause    Patient Active Problem List   Diagnosis Date Noted  . GERD (gastroesophageal reflux disease) 02/16/2018  . Seizures (HCC) 02/16/2018  . History of prolonged Q-T interval on ECG 02/16/2018  . Bronchitis 11/11/2017  . Acute respiratory failure (HCC) 11/03/2017  .  Respiratory distress   . Acute respiratory failure with hypoxia (HCC) 11/02/2017  . Chronic diastolic CHF (congestive heart failure) (HCC) 09/19/2017  . Primary osteoarthritis of left hip 04/07/2017  . Osteoarthritis of left hip 04/02/2017  . Lung nodule 02/04/2016  . Asthma exacerbation with COPD (chronic obstructive pulmonary disease) (HCC) 07/21/2015  . Influenza B 04/23/2015  . Asthma exacerbation 04/22/2015  . Hypokalemia 04/22/2015  . 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 Influenza with pneumonia 07/03/2014  . Multiple lung nodules on CT 07/03/2014  . Asthma 07/02/2014  . Hyperglycemia 07/02/2014  . Chronic obstructive pulmonary disease with acute exacerbation (HCC)   . Prolonged QT interval 06/29/2014    Past Surgical History:  Procedure Laterality Date  . JOINT REPLACEMENT    . KNEE ARTHROSCOPY Right 2017  . 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      Home Medications    Prior to Admission medications   Medication Sig Start Date End Date Taking? Authorizing Provider  albuterol (PROVENTIL HFA;VENTOLIN HFA) 108 (90 Base) MCG/ACT inhaler Inhale 2 puffs into  the lungs every 6 (six) hours as needed for wheezing or shortness of breath. 04/14/18   Roxan Hockey, MD  albuterol (PROVENTIL) (2.5 MG/3ML) 0.083% nebulizer solution Take 3 mLs (2.5 mg total) by nebulization every 6 (six) hours as needed for wheezing or shortness of breath. 04/14/18   Roxan Hockey, MD  budesonide-formoterol (SYMBICORT) 160-4.5 MCG/ACT inhaler Inhale 2 puffs into the lungs 2 (two) times daily. 04/14/18   Roxan Hockey, MD  calcium-vitamin D (OSCAL WITH D) 500-200 MG-UNIT tablet Take 1 tablet by mouth daily with breakfast. 04/14/18   Roxan Hockey, MD   guaiFENesin (MUCINEX) 600 MG 12 hr tablet Take 1 tablet (600 mg total) by mouth 2 (two) times daily. 04/14/18   Roxan Hockey, MD  montelukast (SINGULAIR) 10 MG tablet Take 1 tablet (10 mg total) by mouth daily. 04/14/18   Roxan Hockey, MD  pantoprazole (PROTONIX) 20 MG tablet Take 20 mg by mouth daily as needed for indigestion.    [provider]  predniSONE (DELTASONE) 20 MG tablet Take 2 tablets (40 mg total) by mouth daily with breakfast. 04/14/18   Roxan Hockey, MD  tiotropium (SPIRIVA) 18 MCG inhalation capsule Place 1 capsule (18 mcg total) into inhaler and inhale daily. 04/14/18   Roxan Hockey, MD  triamcinolone lotion (KENALOG) 0.1 % Apply 1 application topically 2 (two) times daily as needed (for rash). 04/14/18   Roxan Hockey, MD    Family History Family History  Problem Relation Age of Onset  . Heart disease Mother   . Heart disease Father   . Diabetes Father   . Asthma Father   . Diabetes Brother   . Diabetes Sister   . Asthma Brother   . Asthma Sister   . Hypertension Sister     Social History Social History   Tobacco Use  . Smoking status: Never Smoker  . Smokeless tobacco: Never Used  Substance Use Topics  . Alcohol use: No  . Drug use: No     Allergies   Contrast media [iodinated diagnostic agents], Peanuts [peanut oil], Shellfish allergy, Soap, Latex, and Eggs or egg-derived products   Review of Systems Review of Systems 10 Systems reviewed and are negative for acute change except as noted in the HPI.   Physical Exam Updated Vital Signs BP 123/67   Pulse 93   Temp 98 F (36.7 C) (Oral)   Resp (!) 23   Wt 90 kg   SpO2 98%   BMI 34.60 kg/m   Physical Exam Constitutional:      Comments: Patient is alert.  Moderate increased work of breathing at rest.  Speaking in short full sentences.  HENT:     Head: Normocephalic and atraumatic.  Eyes:     Extraocular Movements: Extraocular movements intact.  Cardiovascular:      Comments: Borderline tachycardia.  No gross rub murmur gallop. Pulmonary:     Comments: Moderate increased work of breathing.  Speaking in short full sentences.  Coarse wheeze throughout the upper and midlung fields.  Decreased breath sounds to the bases.  Symmetric airflow. Abdominal:     General: There is no distension.     Palpations: Abdomen is soft.     Tenderness: There is no abdominal tenderness. There is no guarding.  Musculoskeletal: Normal range of motion.        General: No swelling or tenderness.     Right lower leg: No edema.     Left lower leg: No edema.     Comments: Calf soft  and nontender.  No peripheral edema.  Skin condition very good.  Neurological:     General: No focal deficit present.     Mental Status: She is oriented to person, place, and time.     Coordination: Coordination normal.  Psychiatric:        Mood and Affect: Mood normal.      ED Treatments / Results  Labs (all labs ordered are listed, but only abnormal results are displayed) Labs Reviewed  COMPREHENSIVE METABOLIC PANEL - Abnormal; Notable for the following components:      Result Value   Glucose, Bld 122 (*)    All other components within normal limits  CBC WITH DIFFERENTIAL/PLATELET - Abnormal; Notable for the following components:   HCT 47.2 (*)    All other components within normal limits  SARS CORONAVIRUS 2 (HOSPITAL ORDER, PERFORMED IN [facility])  CULTURE, BLOOD (ROUTINE X 2)  CULTURE, BLOOD (ROUTINE X 2)  BRAIN NATRIURETIC PEPTIDE  LACTIC ACID, PLASMA  LACTIC ACID, PLASMA  URINALYSIS, ROUTINE W REFLEX MICROSCOPIC  TROPONIN I (HIGH SENSITIVITY)  TROPONIN I (HIGH SENSITIVITY)    EKG EKG Interpretation  Date/Time:  Monday January 25 2019 12:38:32 EDT Ventricular Rate:  118 PR Interval:    QRS Duration: 90 QT Interval:  291 QTC Calculation: 408 R Axis:   86 Text Interpretation:  Sinus tachycardia Probable left atrial enlargement Consider right ventricular  hypertrophy Nonspecific T abnormalities, diffuse leads Since last tracing rate faster Confirmed by Linwood Dibbles [phone]) on 01/25/2019 3:29:17 PM   Radiology Dg Chest Port 1 View  Result Date: 01/25/2019 CLINICAL DATA:  Shortness of breath.  Productive cough. EXAM: PORTABLE CHEST 1 VIEW COMPARISON:  02/08/2018 FINDINGS: The heart size and mediastinal contours are within normal limits. Both lungs are clear. The visualized skeletal structures are unremarkable. IMPRESSION: Normal exam. Electronically Signed   By: Francene Boyers M.D.   On: 01/25/2019 13:26    Procedures Procedures (including critical care time) CRITICAL CARE Performed by: Arby Barrette   Total critical care time: 30 minutes  Critical care time was exclusive of separately billable procedures and treating other patients.  Critical care was necessary to treat or prevent imminent or life-threatening deterioration.  Critical care was time spent personally by me on the following activities: development of treatment plan with patient and/or surrogate as well as nursing, discussions with consultants, evaluation of patient's response to treatment, examination of patient, obtaining history from patient or surrogate, ordering and performing treatments and interventions, ordering and review of laboratory studies, ordering and review of radiographic studies, pulse oximetry and re-evaluation of patient's condition. Medications Ordered in ED Medications  albuterol (VENTOLIN HFA) 108 (90 Base) MCG/ACT inhaler 4 puff (4 puffs Inhalation Given 01/25/19 1317)  ipratropium (ATROVENT HFA) inhaler 2 puff (2 puffs Inhalation Not Given 01/25/19 1511)  ipratropium-albuterol (DUONEB) 0.5-2.5 (3) MG/3ML nebulizer solution 3 mL (has no administration in time range)  methylPREDNISolone sodium succinate (SOLU-MEDROL) 125 mg/2 mL injection 125 mg (125 mg Intravenous Given 01/25/19 1318)  ipratropium-albuterol (DUONEB) 0.5-2.5 (3) MG/3ML nebulizer solution 3 mL  (3 mLs Nebulization Given 01/25/19 1504)     Initial Impression / Assessment and Plan / ED Course  I have reviewed the triage vital signs and the nursing notes.  Pertinent labs & imaging results that were available during my care of the patient were reviewed by me and considered in my medical decision making (see chart for details).       Recheck: Patient has had her  first aerosolized nebulizer treatment.  Patient reports she is doing much better.  She continues to feel diffuse tightness but can feel a lot better airflow now.  She continues to have coarse wheeze throughout the lung fields with some improvement in aeration to the bases.  Clinically she appears improved.  Patient is on her baseline 2 L oxygen at 95%.  Patient presents with symptoms consistent with asthma exacerbation.  She is nontoxic.  Patient has been afebrile.  She has had significant improvement with treatment.  At this point she will need repeat nebulizer therapies to see if she has significant and sufficient improvement for possible discharge.  If patient continues to have shortness of breath, significant wheezing or hypoxia on exertion, may require admission.  Final Clinical Impressions(s) / ED Diagnoses   Final diagnoses:  None    ED Discharge Orders    None       Arby BarrettePfeiffer, Ivery Michalski, MD 01/25/19 [id number]    Arby BarrettePfeiffer, Harnoor Reta, MD 01/25/19 1534

## 2019-01-26 ENCOUNTER — Encounter (HOSPITAL_COMMUNITY): Payer: Self-pay

## 2019-01-26 ENCOUNTER — Inpatient Hospital Stay (HOSPITAL_COMMUNITY)
Admission: EM | Admit: 2019-01-26 | Discharge: 2019-01-30 | DRG: 190 | Disposition: A | Discharge status: Home / Self Care | Payer: Medicare Other | Attending: Family Medicine | Admitting: Family Medicine

## 2019-01-26 ENCOUNTER — Other Ambulatory Visit: Payer: Self-pay

## 2019-01-26 ENCOUNTER — Emergency Department (HOSPITAL_COMMUNITY): Payer: Medicare Other

## 2019-01-26 DIAGNOSIS — R911 Solitary pulmonary nodule: Secondary | ICD-10-CM | POA: Diagnosis present

## 2019-01-26 DIAGNOSIS — J9621 Acute and chronic respiratory failure with hypoxia: Secondary | ICD-10-CM

## 2019-01-26 DIAGNOSIS — Z96642 Presence of left artificial hip joint: Secondary | ICD-10-CM | POA: Diagnosis present

## 2019-01-26 DIAGNOSIS — Z9981 Dependence on supplemental oxygen: Secondary | ICD-10-CM

## 2019-01-26 DIAGNOSIS — Z91040 Latex allergy status: Secondary | ICD-10-CM

## 2019-01-26 DIAGNOSIS — J189 Pneumonia, unspecified organism: Secondary | ICD-10-CM

## 2019-01-26 DIAGNOSIS — I5032 Chronic diastolic (congestive) heart failure: Secondary | ICD-10-CM | POA: Diagnosis present

## 2019-01-26 DIAGNOSIS — J45901 Unspecified asthma with (acute) exacerbation: Secondary | ICD-10-CM | POA: Diagnosis not present

## 2019-01-26 DIAGNOSIS — Z91010 Allergy to peanuts: Secondary | ICD-10-CM

## 2019-01-26 DIAGNOSIS — Z91048 Other nonmedicinal substance allergy status: Secondary | ICD-10-CM

## 2019-01-26 DIAGNOSIS — G40909 Epilepsy, unspecified, not intractable, without status epilepticus: Secondary | ICD-10-CM | POA: Diagnosis present

## 2019-01-26 DIAGNOSIS — J441 Chronic obstructive pulmonary disease with (acute) exacerbation: Secondary | ICD-10-CM | POA: Diagnosis not present

## 2019-01-26 DIAGNOSIS — Z91013 Allergy to seafood: Secondary | ICD-10-CM

## 2019-01-26 DIAGNOSIS — T380X5A Adverse effect of glucocorticoids and synthetic analogues, initial encounter: Secondary | ICD-10-CM | POA: Diagnosis present

## 2019-01-26 DIAGNOSIS — Z91041 Radiographic dye allergy status: Secondary | ICD-10-CM

## 2019-01-26 DIAGNOSIS — E872 Acidosis: Secondary | ICD-10-CM | POA: Diagnosis present

## 2019-01-26 DIAGNOSIS — Z20828 Contact with and (suspected) exposure to other viral communicable diseases: Secondary | ICD-10-CM | POA: Diagnosis present

## 2019-01-26 DIAGNOSIS — Z7952 Long term (current) use of systemic steroids: Secondary | ICD-10-CM

## 2019-01-26 DIAGNOSIS — Z7951 Long term (current) use of inhaled steroids: Secondary | ICD-10-CM

## 2019-01-26 DIAGNOSIS — K219 Gastro-esophageal reflux disease without esophagitis: Secondary | ICD-10-CM | POA: Diagnosis present

## 2019-01-26 DIAGNOSIS — D72829 Elevated white blood cell count, unspecified: Secondary | ICD-10-CM | POA: Diagnosis present

## 2019-01-26 DIAGNOSIS — Z825 Family history of asthma and other chronic lower respiratory diseases: Secondary | ICD-10-CM

## 2019-01-26 DIAGNOSIS — Z833 Family history of diabetes mellitus: Secondary | ICD-10-CM

## 2019-01-26 DIAGNOSIS — Z6834 Body mass index (BMI) 34.0-34.9, adult: Secondary | ICD-10-CM

## 2019-01-26 DIAGNOSIS — J9622 Acute and chronic respiratory failure with hypercapnia: Secondary | ICD-10-CM | POA: Diagnosis present

## 2019-01-26 DIAGNOSIS — Z91012 Allergy to eggs: Secondary | ICD-10-CM

## 2019-01-26 LAB — CBC WITH DIFFERENTIAL/PLATELET
Abs Immature Granulocytes: 0.15 10*3/uL — ABNORMAL HIGH (ref 0.00–0.07)
Basophils Absolute: 0 10*3/uL (ref 0.0–0.1)
Basophils Relative: 0 %
Eosinophils Absolute: 0 10*3/uL (ref 0.0–0.5)
Eosinophils Relative: 0 %
HCT: 47.2 % — ABNORMAL HIGH (ref 36.0–46.0)
Hemoglobin: 13.8 g/dL (ref 12.0–15.0)
Immature Granulocytes: 1 %
Lymphocytes Relative: 8 %
Lymphs Abs: 1 10*3/uL (ref 0.7–4.0)
MCH: 28.6 pg (ref 26.0–34.0)
MCHC: 29.2 g/dL — ABNORMAL LOW (ref 30.0–36.0)
MCV: 97.7 fL (ref 80.0–100.0)
Monocytes Absolute: 0.4 10*3/uL (ref 0.1–1.0)
Monocytes Relative: 3 %
Neutro Abs: 10.8 10*3/uL — ABNORMAL HIGH (ref 1.7–7.7)
Neutrophils Relative %: 88 %
Platelets: 230 10*3/uL (ref 150–400)
RBC: 4.83 MIL/uL (ref 3.87–5.11)
RDW: 15.1 % (ref 11.5–15.5)
WBC: 12.3 10*3/uL — ABNORMAL HIGH (ref 4.0–10.5)
nRBC: 0 % (ref 0.0–0.2)

## 2019-01-26 LAB — BASIC METABOLIC PANEL
Anion gap: 13 (ref 5–15)
BUN: 12 mg/dL (ref 8–23)
CO2: 24 mmol/L (ref 22–32)
Calcium: 10.2 mg/dL (ref 8.9–10.3)
Chloride: 103 mmol/L (ref 98–111)
Creatinine, Ser: 0.7 mg/dL (ref 0.44–1.00)
GFR calc Af Amer: >60 mL/min (ref >60)
GFR calc non Af Amer: >60 mL/min (ref >60)
Glucose, Bld: 175 mg/dL — ABNORMAL HIGH (ref 70–99)
Potassium: 4.7 mmol/L (ref 3.5–5.1)
Sodium: 140 mmol/L (ref 135–145)

## 2019-01-26 LAB — PROCALCITONIN: Procalcitonin: <0.1 ng/mL

## 2019-01-26 MED ORDER — PANTOPRAZOLE SODIUM 20 MG PO TBEC
20.0000 mg | DELAYED_RELEASE_TABLET | Freq: Every day | ORAL | Status: DC | PRN
  Filled 2019-01-26: qty 1

## 2019-01-26 MED ORDER — MONTELUKAST SODIUM 10 MG PO TABS
10.0000 mg | ORAL_TABLET | Freq: Every day | ORAL | Status: DC
  Administered 2019-01-28 – 2019-01-30 (×3): 10 mg via ORAL
  Filled 2019-01-26 (×3): qty 1

## 2019-01-26 MED ORDER — ENOXAPARIN SODIUM 40 MG/0.4ML ~~LOC~~ SOLN: 40.0000 mg | SUBCUTANEOUS | Status: DC

## 2019-01-26 MED ORDER — METHYLPREDNISOLONE SODIUM SUCC 40 MG IJ SOLR
40.0000 mg | Freq: Two times a day (BID) | INTRAMUSCULAR | Status: DC
  Administered 2019-01-27: 40 mg via INTRAVENOUS
  Filled 2019-01-26: qty 1

## 2019-01-26 MED ORDER — IPRATROPIUM BROMIDE 0.02 % IN SOLN
1.0000 mg | Freq: Once | RESPIRATORY_TRACT | Status: AC
  Administered 2019-01-26: 1 mg via RESPIRATORY_TRACT
  Filled 2019-01-26: qty 5

## 2019-01-26 MED ORDER — TIOTROPIUM BROMIDE MONOHYDRATE 18 MCG IN CAPS: 18.0000 ug | ORAL_CAPSULE | Freq: Every day | RESPIRATORY_TRACT | Status: DC

## 2019-01-26 MED ORDER — ALBUTEROL SULFATE (2.5 MG/3ML) 0.083% IN NEBU
2.5000 mg | INHALATION_SOLUTION | RESPIRATORY_TRACT | Status: DC | PRN
  Administered 2019-01-27: 2.5 mg via RESPIRATORY_TRACT
  Filled 2019-01-26: qty 3

## 2019-01-26 MED ORDER — ALBUTEROL SULFATE (2.5 MG/3ML) 0.083% IN NEBU
5.0000 mg | INHALATION_SOLUTION | Freq: Once | RESPIRATORY_TRACT | Status: AC
  Administered 2019-01-26: 5 mg via RESPIRATORY_TRACT
  Filled 2019-01-26: qty 6

## 2019-01-26 MED ORDER — IPRATROPIUM-ALBUTEROL 0.5-2.5 (3) MG/3ML IN SOLN
3.0000 mL | Freq: Four times a day (QID) | RESPIRATORY_TRACT | Status: DC
  Administered 2019-01-27: 3 mL via RESPIRATORY_TRACT
  Filled 2019-01-26: qty 3

## 2019-01-26 MED ORDER — METHYLPREDNISOLONE SODIUM SUCC 125 MG IJ SOLR
125.0000 mg | Freq: Once | INTRAMUSCULAR | Status: AC
  Administered 2019-01-26: 125 mg via INTRAVENOUS
  Filled 2019-01-26: qty 2

## 2019-01-26 MED ORDER — ACETAMINOPHEN 650 MG RE SUPP: 650.0000 mg | Freq: Four times a day (QID) | RECTAL | Status: DC | PRN

## 2019-01-26 MED ORDER — MAGNESIUM SULFATE 2 GM/50ML IV SOLN
2.0000 g | Freq: Once | INTRAVENOUS | Status: AC
  Administered 2019-01-26: 2 g via INTRAVENOUS
  Filled 2019-01-26: qty 50

## 2019-01-26 MED ORDER — FLUTICASONE PROPIONATE 50 MCG/ACT NA SUSP
1.0000 | Freq: Every day | NASAL | Status: DC
  Administered 2019-01-28 – 2019-01-30 (×3): 1 via NASAL
  Filled 2019-01-26 (×2): qty 16

## 2019-01-26 MED ORDER — GUAIFENESIN ER 600 MG PO TB12
600.0000 mg | ORAL_TABLET | Freq: Two times a day (BID) | ORAL | Status: DC
  Administered 2019-01-26 – 2019-01-30 (×5): 600 mg via ORAL
  Filled 2019-01-26 (×7): qty 1

## 2019-01-26 MED ORDER — ENOXAPARIN SODIUM 40 MG/0.4ML ~~LOC~~ SOLN
40.0000 mg | Freq: Every day | SUBCUTANEOUS | Status: DC
  Administered 2019-01-27 – 2019-01-29 (×3): 40 mg via SUBCUTANEOUS
  Filled 2019-01-26 (×3): qty 0.4

## 2019-01-26 MED ORDER — ACETAMINOPHEN 325 MG PO TABS: 650.0000 mg | ORAL_TABLET | Freq: Four times a day (QID) | ORAL | Status: DC | PRN

## 2019-01-26 NOTE — H&P (Signed)
History and Physical    Sonia Drake EVO:[id number] DOB: Oct 15, 1955 DOA: 01/26/2019  PCP: Verlon Au, MD  Patient coming from: Home  I have personally briefly reviewed patient's old medical records in [facility]  Chief Complaint: Shortness of breath  HPI: Sonia Drake is a 64 y.o. female with medical history significant for asthma with COPD and chronic respiratory failure on 2 L supplemental O2 via [place] at home, chronic diastolic CHF, and GERD who presents to the ED for evaluation of shortness of breath.  Patient initially presented to the ED yesterday (01/25/2019) for shortness of breath and wheezing.  She was diagnosed with asthma exacerbation and treated with several bronchodilator treatments and IV Solu-Medrol.  She improved and was discharged to home with doxycycline and a prednisone taper.  She had a negative SARS-CoV-2 test on 01/25/2019.  Patient states she did feel improved and near her usual baseline after returning home yesterday however today her symptoms returned and progressively worsened.  She has been having significant shortness of breath, wheezing, and fatigue.  She reports a chronic cough productive of clear sputum which is unchanged.  She has been having only transient relief with her home nebulizer treatments and has had to use them with increasing frequency.  She also reports seasonal allergies which she feels are contributing to exacerbation of her asthma/COPD.  She denies any subjective fevers, chills, diaphoresis, chest pain, dysuria, or peripheral edema.  She states that she has been self quarantining since February of this year with only occasional contact with her daughter and grandchildren.  ED Course:  Initial vitals showed BP 153/80, pulse 120, RR 24, temp 98.3 Fahrenheit, SPO2 100% on 5 L supplemental O2 via [place].  Per EDP O2 saturation was down to 65% in the triage area.  Labs notable for WBC 12.3, hemoglobin 13.8, platelets 230,000, sodium 140,  potassium 4.7, bicarb 24, BUN 12, creatinine 0.7, serum glucose 175.  Portable chest x-ray was negative for focal consolidation, effusion, or edema.  Patient was given IV Solu-Medrol 125 mg, IV magnesium, albuterol and Atrovent nebulizer treatments and the hospitalist service was consulted to admit for further evaluation and management.  Review of Systems: All systems reviewed and are negative except as documented in history of present illness above.   Past Medical History:  Diagnosis Date  . Arthritis    "right knee; left hip" (04/07/2017)  . Asthma   . CHF (congestive heart failure) (HCC)    reported treatment for fluid overload in the setting of asthma exacerbation ~ 1998  . COPD (chronic obstructive pulmonary disease) (HCC)    patient states she does not have COPD but was treated for it.   . 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 Dyspnea   . Dysrhythmia    occasional skipped beats  . GERD (gastroesophageal reflux disease)   . On home oxygen therapy    "I sleep with 2L" (04/07/2017)  . Pneumonia    "several times" (04/07/2017)  . Seizures (HCC) 2003 X 1   unknown cause    Past Surgical History:  Procedure Laterality Date  . JOINT REPLACEMENT    . KNEE ARTHROSCOPY Right 2017  . 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 TOTAL HIP ARTHROPLASTY Left 04/07/2017   Procedure: LEFT TOTAL HIP ARTHROPLASTY ANTERIOR APPROACH;  Surgeon: Gean Birchwood, MD;  Location: [facility] OR;  Service: Orthopedics;  Laterality: Left;    Social History:  reports that she has never smoked. She has never  used smokeless tobacco. She reports that she does not drink alcohol or use drugs.  Allergies  Allergen Reactions  . Contrast Media [Iodinated Diagnostic Agents] Anaphylaxis and Other (See Comments)    ** CARDIAC ARREST **  . Peanuts [Peanut Oil] Shortness Of Breath and Swelling  . Shellfish Allergy Shortness Of Breath and Swelling  . Soap Shortness Of Breath and Swelling    LAUNDRY  DETERGENT  . Latex Hives  . Eggs Or Egg-Derived Products Rash    Family History  Problem Relation Age of Onset  . Heart disease Mother   . Heart disease Father   . Diabetes Father   . Asthma Father   . Diabetes Brother   . Diabetes Sister   . Asthma Brother   . Asthma Sister   . Hypertension Sister      Prior to Admission medications   Medication Sig Start Date End Date Taking? Authorizing Provider  albuterol (PROVENTIL HFA;VENTOLIN HFA) 108 (90 Base) MCG/ACT inhaler Inhale 2 puffs into the lungs every 6 (six) hours as needed for wheezing or shortness of breath. 04/14/18   Roxan Hockey, MD  albuterol (PROVENTIL) (2.5 MG/3ML) 0.083% nebulizer solution Take 3 mLs (2.5 mg total) by nebulization every 6 (six) hours as needed for wheezing or shortness of breath. 04/14/18   Roxan Hockey, MD  budesonide-formoterol (SYMBICORT) 160-4.5 MCG/ACT inhaler Inhale 2 puffs into the lungs 2 (two) times daily. 04/14/18   Roxan Hockey, MD  calcium-vitamin D (OSCAL WITH D) 500-200 MG-UNIT tablet Take 1 tablet by mouth daily with breakfast. 04/14/18   Roxan Hockey, MD  doxycycline (VIBRA-TABS) 100 MG tablet Take 1 tablet (100 mg total) by mouth 2 (two) times daily. 01/25/19   Dorie Rank, MD  fluticasone (FLONASE) 50 MCG/ACT nasal spray Place 1 spray into both nostrils daily. 01/01/19   [provider]  guaiFENesin (MUCINEX) 600 MG 12 hr tablet Take 1 tablet (600 mg total) by mouth 2 (two) times daily. Patient not taking: Reported on 01/25/2019 04/14/18   Roxan Hockey, MD  montelukast (SINGULAIR) 10 MG tablet Take 1 tablet (10 mg total) by mouth daily. 04/14/18   Roxan Hockey, MD  pantoprazole (PROTONIX) 20 MG tablet Take 20 mg by mouth daily as needed for indigestion.    [provider]  predniSONE (STERAPRED UNI-PAK 21 TAB) 10 MG (21) TBPK tablet Take 6 tablets (60 mg total) by mouth daily for 2 days, THEN 5 tablets (50 mg total) daily for 2 days, THEN 4 tablets (40  mg total) daily for 2 days, THEN 3 tablets (30 mg total) daily for 2 days, THEN 2 tablets (20 mg total) daily for 2 days, THEN 1 tablet (10 mg total) daily for 2 days. 01/25/19 02/06/19  Dorie Rank, MD  tiotropium (SPIRIVA) 18 MCG inhalation capsule Place 1 capsule (18 mcg total) into inhaler and inhale daily. 04/14/18   Roxan Hockey, MD  triamcinolone lotion (KENALOG) 0.1 % Apply 1 application topically 2 (two) times daily as needed (for rash). 04/14/18   Roxan Hockey, MD    Physical Exam: Vitals:   01/26/19 2010 01/26/19 2100 01/26/19 2130  BP: (!) 153/80 135/74 (!) 98/55  Pulse: (!) 130 (!) 113 (!) 108  Resp: (!) 24 (!) 36 (!) 28  Temp: 98.3 F (36.8 C)    TempSrc: Oral    SpO2: 100% 97% 96%    Constitutional: Sitting up in bed receiving albuterol nebulizer treatment, NAD, calm, comfortable Eyes: PERRL, lids and conjunctivae normal ENMT: Mucous membranes are moist.  Posterior pharynx clear of any exudate or lesions.Normal dentition.  Neck: normal, supple, no masses. Respiratory: Diffuse coarse expiratory wheezing.  Normal respiratory effort. No accessory muscle use.  Cardiovascular: Regular rate and rhythm, no murmurs / rubs / gallops. No extremity edema. 2+ pedal pulses. Abdomen: no tenderness, no masses palpated. No hepatosplenomegaly. Bowel sounds positive.  Musculoskeletal: no clubbing / cyanosis. No joint deformity upper and lower extremities. Good ROM, no contractures. Normal muscle tone.  Skin: no rashes, lesions, ulcers. No induration Neurologic: CN 2-12 grossly intact. Sensation intact,Strength 5/5 in all 4.  Psychiatric: Normal judgment and insight. Alert and oriented x 3. Normal mood.     Labs on Admission: I have personally reviewed following labs and imaging studies  CBC: Recent Labs  Lab 01/25/19 1246 01/26/19 2024  WBC 7.6 12.3*  NEUTROABS 5.3 10.8*  HGB 14.2 13.8  HCT 47.2* 47.2*  MCV 95.0 97.7  PLT 232 230   Basic Metabolic Panel: Recent Labs   Lab 01/25/19 1246 01/26/19 2024  NA 140 140  K 3.6 4.7  CL 104 103  CO2 29 24  GLUCOSE 122* 175*  BUN 8 12  CREATININE 0.83 0.70  CALCIUM 9.7 10.2   GFR: CrCl cannot be calculated (Unknown ideal weight.). Liver Function Tests: Recent Labs  Lab 01/25/19 1246  AST 29  ALT 22  ALKPHOS 93  BILITOT 0.6  PROT 8.1  ALBUMIN 4.2   No results for input(s): LIPASE, AMYLASE in the last 168 hours. No results for input(s): AMMONIA in the last 168 hours. Coagulation Profile: No results for input(s): INR, PROTIME in the last 168 hours. Cardiac Enzymes: No results for input(s): CKTOTAL, CKMB, CKMBINDEX, TROPONINI in the last 168 hours. BNP (last 3 results) No results for input(s): PROBNP in the last 8760 hours. HbA1C: No results for input(s): HGBA1C in the last 72 hours. CBG: No results for input(s): GLUCAP in the last 168 hours. Lipid Profile: No results for input(s): CHOL, HDL, LDLCALC, TRIG, CHOLHDL, LDLDIRECT in the last 72 hours. Thyroid Function Tests: No results for input(s): TSH, T4TOTAL, FREET4, T3FREE, THYROIDAB in the last 72 hours. Anemia Panel: No results for input(s): VITAMINB12, FOLATE, FERRITIN, TIBC, IRON, RETICCTPCT in the last 72 hours. Urine analysis:    Component Value Date/Time   COLORURINE YELLOW 01/25/2019 1858   APPEARANCEUR HAZY (A) 01/25/2019 1858   LABSPEC 1.026 01/25/2019 1858   PHURINE 5.0 01/25/2019 1858   GLUCOSEU NEGATIVE 01/25/2019 1858   HGBUR NEGATIVE 01/25/2019 1858   BILIRUBINUR NEGATIVE 01/25/2019 1858   KETONESUR NEGATIVE 01/25/2019 1858   PROTEINUR NEGATIVE 01/25/2019 1858   UROBILINOGEN 0.2 07/03/2014 1456   NITRITE NEGATIVE 01/25/2019 1858   LEUKOCYTESUR TRACE (A) 01/25/2019 1858    Radiological Exams on Admission: Dg Chest Port 1 View  Result Date: 01/26/2019 CLINICAL DATA:  Shortness of breath EXAM: PORTABLE CHEST 1 VIEW COMPARISON:  01/25/2019, 04/10/2018 FINDINGS: The patient's chin obscures the lung apices. No  consolidation or effusion. Normal cardiomediastinal silhouette. No pneumothorax. IMPRESSION: No active disease. Electronically Signed   By: Jasmine Pang M.D.   On: 01/26/2019 20:41   Dg Chest Port 1 View  Result Date: 01/25/2019 CLINICAL DATA:  Shortness of breath.  Productive cough. EXAM: PORTABLE CHEST 1 VIEW COMPARISON:  02/08/2018 FINDINGS: The heart size and mediastinal contours are within normal limits. Both lungs are clear. The visualized skeletal structures are unremarkable. IMPRESSION: Normal exam. Electronically Signed   By: Francene Boyers M.D.   On: 01/25/2019 13:26    EKG: Independently  reviewed. Sinus tachycardia, RSR pattern in V1 and V2, QTC 394, not significantly changed from prior.  Assessment/Plan Principal Problem:   Asthma with COPD with exacerbation (HCC) Active Problems:   Chronic diastolic CHF (congestive heart failure) (HCC)   Acute on chronic respiratory failure with hypoxia (HCC)   GERD (gastroesophageal reflux disease)  Sonia Drake is a 63 y.o. female with medical history significant for asthma with COPD and chronic respiratory failure on 2 L supplemental O2 via [place] at home, chronic diastolic CHF, and GERD who is admitted with acute on chronic mixed asthma/COPD exacerbation.   Acute on chronic mixed asthma/COPD exacerbation with hypoxic respiratory failure: Uses 2 L of O2 via [place] at home at all times with reported desaturation to 65% while in the ED triage area.  She has continued dyspnea with diffuse coarse expiratory wheezing on admission.  SARS-CoV-2 test was negative 01/25/2019.  Chest x-ray without evidence of infiltrate.  Leukocytosis noted and likely due to steroid use. -Continue scheduled duonebs with as needed albuterol nebulizers -Continue IV Solu-Medrol 40 mg twice daily -Continue Singulair -Supplemental oxygen to maintain O2 sats 90-92%  Chronic diastolic CHF: EF 29-52%60-65% with grade 2 diastolic dysfunction by last echocardiogram 06/2014.  Appears  euvolemic on admission.  Is not requiring diuretic therapy on an outpatient basis. -Continue to monitor volume status  GERD: Continue PPI.  DVT prophylaxis: Lovenox Code Status: Full code, confirmed with patient Family Communication: Patient has discussed with family Disposition Plan: Pending clinical progress Consults called: None Admission status: Observation   Darreld McleanVishal Eyoel Throgmorton MD [facility]  If 7PM-7AM, please contact night-coverage [email or web address]  01/26/2019, 9:59 PM

## 2019-01-26 NOTE — ED Notes (Signed)
Date and time results received: 01/26/19 10:19 PM  Test: Blood Gas CO2 Critical Value: 82.3  Name of Provider Notified: Posey Pronto MD  Orders Received? Or Actions Taken?:

## 2019-01-26 NOTE — ED Notes (Signed)
X-ray at bedside

## 2019-01-26 NOTE — ED Notes (Signed)
ED TO INPATIENT HANDOFF REPORT  Name/Age/Gender Sonia Drake 63 y.o. female  Code Status    Code Status Orders  (From admission, onward)         Start     Ordered   01/26/19 2146  Full code  Continuous     01/26/19 2146        Code Status History    Date Active Date Inactive Code Status Order ID Comments User Context   04/10/2018 0442 04/14/2018 1538 Full Code [id number]  Clydie BraunSmith, Rondell A, MD ED   02/16/2018 1149 02/18/2018 1616 Full Code [id number]  Bobette Mortiz, David Manuel, MD Inpatient   02/16/2018 1041 02/16/2018 1149 Full Code [id number]  Bobette Mortiz, David Manuel, MD ED   11/03/2017 0218 11/04/2017 2037 Full Code [id number]  Eduard ClosKakrakandy, Arshad N, MD ED   09/19/2017 1808 09/20/2017 1657 Full Code [id number]  Narda BondsNettey, Ralph A, MD Inpatient   04/07/2017 1430 04/09/2017 2229 Full Code [id number]  Allena KatzPhillips, Eric K, PA-C Inpatient   02/16/2017 1415 02/18/2017 1739 Full Code [id number]  Haydee SalterHobbs, Phillip M, MD ED   02/04/2016 0752 02/05/2016 1413 Full Code [id number]  Elgergawy, Leana Roeawood S, MD ED   07/21/2015 2317 07/24/2015 1636 Full Code [id number]  Therisa Doyneoutova, Anastassia, MD ED   04/22/2015 1356 04/24/2015 1422 Full Code [id number]  Rama, Maryruth Bunhristina P, MD Inpatient   07/02/2014 1633 07/07/2014 1531 Full Code [id number]  Conley Canalanga, Simbiso, MD Inpatient   06/29/2014 0055 06/30/2014 2046 Full Code [id number]  Lorretta HarpNiu, Xilin, MD Inpatient   Advance Care Planning Activity      Home/SNF/Other Home  Chief Complaint asthma attack  Level of Care/Admitting Diagnosis ED Disposition    ED Disposition Condition Comment   Admit  [facility][id number]  Level of Care: Med-Surg [16]  Covid Evaluation: Confirmed COVID Negative  Diagnosis: Asthma with COPD with exacerbation [facility](HCC) [[id number]]  Admitting Physician: Charlsie QuestPATEL, VISHAL R [[id number]]  Attending Physician: Charlsie QuestPATEL, VISHAL R [[id number]]  PT Class (Do Not Modify): Observation [104]  PT Acc Code (Do Not Modify): Observation [10022]       Medical  History Past Medical History:  Diagnosis Date  . Arthritis    "right knee; left hip" (04/07/2017)  . Asthma   . CHF (congestive heart failure) (HCC)    reported treatment for fluid overload in the setting of asthma exacerbation ~ 1998  . COPD (chronic obstructive pulmonary disease) (HCC)    patient states she does not have COPD but was treated for it.   . 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 Dyspnea   . Dysrhythmia    occasional skipped beats  . GERD (gastroesophageal reflux disease)   . On home oxygen therapy    "I sleep with 2L" (04/07/2017)  . Pneumonia    "several times" (04/07/2017)  . Seizures (HCC) 2003 X 1   unknown cause    Allergies Allergies  Allergen Reactions  . Contrast Media [Iodinated Diagnostic Agents] Anaphylaxis and Other (See Comments)    ** CARDIAC ARREST **  . Peanuts [Peanut Oil] Shortness Of Breath and Swelling  . Shellfish Allergy Shortness Of Breath and Swelling  . Soap Shortness Of Breath and Swelling    LAUNDRY DETERGENT  . Latex Hives  . Eggs Or Egg-Derived Products Rash    IV Location/Drains/Wounds Patient Lines/Drains/Airways Status   Active Line/Drains/Airways    Name:   Placement date:   Placement time:   Site:   Days:   Peripheral IV 01/25/19 Left Antecubital  01/25/19    1251    Antecubital   1   Peripheral IV 01/25/19 Right Antecubital   01/25/19    1251    Antecubital   1   Peripheral IV 01/26/19 Right Antecubital   01/26/19    2024    Antecubital   less than 1          Labs/Imaging Results for orders placed or performed during the hospital encounter of 01/26/19 (from the past 48 hour(s))  CBC with Differential/Platelet     Status: Abnormal   Collection Time: 01/26/19  8:24 PM  Result Value Ref Range   WBC 12.3 (H) 4.0 - 10.5 K/uL   RBC 4.83 3.87 - 5.11 MIL/uL   Hemoglobin 13.8 12.0 - 15.0 g/dL   HCT 48.1 (H) 85.6 - 31.4 %   MCV 97.7 80.0 - 100.0 fL   MCH 28.6 26.0 - 34.0 pg   MCHC 29.2 (L) 30.0 -  36.0 g/dL   RDW 97.0 26.3 - 78.5 %   Platelets 230 150 - 400 K/uL   nRBC 0.0 0.0 - 0.2 %   Neutrophils Relative % 88 %   Neutro Abs 10.8 (H) 1.7 - 7.7 K/uL   Lymphocytes Relative 8 %   Lymphs Abs 1.0 0.7 - 4.0 K/uL   Monocytes Relative 3 %   Monocytes Absolute 0.4 0.1 - 1.0 K/uL   Eosinophils Relative 0 %   Eosinophils Absolute 0.0 0.0 - 0.5 K/uL   Basophils Relative 0 %   Basophils Absolute 0.0 0.0 - 0.1 K/uL   Immature Granulocytes 1 %   Abs Immature Granulocytes 0.15 (H) 0.00 - 0.07 K/uL    Comment: Performed at [facility], [address], [place], [place] [address]  Basic metabolic panel     Status: Abnormal   Collection Time: 01/26/19  8:24 PM  Result Value Ref Range   Sodium 140 135 - 145 mmol/L   Potassium 4.7 3.5 - 5.1 mmol/L    Comment: REPEATED TO VERIFY DELTA CHECK NOTED NO VISIBLE HEMOLYSIS    Chloride 103 98 - 111 mmol/L   CO2 24 22 - 32 mmol/L   Glucose, Bld 175 (H) 70 - 99 mg/dL   BUN 12 8 - 23 mg/dL   Creatinine, Ser 7.74 0.44 - 1.00 mg/dL   Calcium 12.8 8.9 - 78.6 mg/dL   GFR calc non Af Amer >60 >60 mL/min   GFR calc Af Amer >60 >60 mL/min   Anion gap 13 5 - 15    Comment: Performed at [facility], [address] [place], [place] [address]   Dg Chest Port 1 View  Result Date: 01/26/2019 CLINICAL DATA:  Shortness of breath EXAM: PORTABLE CHEST 1 VIEW COMPARISON:  01/25/2019, 04/10/2018 FINDINGS: The patient's chin obscures the lung apices. No consolidation or effusion. Normal cardiomediastinal silhouette. No pneumothorax. IMPRESSION: No active disease. Electronically Signed   By: Jasmine Pang M.D.   On: 01/26/2019 20:41   Dg Chest Port 1 View  Result Date: 01/25/2019 CLINICAL DATA:  Shortness of breath.  Productive cough. EXAM: PORTABLE CHEST 1 VIEW COMPARISON:  02/08/2018 FINDINGS: The heart size and mediastinal contours are within normal limits. Both lungs are clear. The visualized skeletal structures are  unremarkable. IMPRESSION: Normal exam. Electronically Signed   By: Francene Boyers M.D.   On: 01/25/2019 13:26    Pending Labs Unresulted Labs (From admission, onward)    Start     Ordered   01/27/19 0500  Basic metabolic panel  Tomorrow morning,   R     01/26/19 2146   01/27/19 0500  CBC  Tomorrow morning,   R     01/26/19 2146   01/26/19 2158  Procalcitonin - Baseline  Add-on,   AD     01/26/19 2158   01/26/19 2114  Blood gas, venous  Once,   STAT     01/26/19 2114          Vitals/Pain Today's Vitals   01/26/19 2012 01/26/19 2100 01/26/19 2130 01/26/19 2200  BP:  135/74 (!) 98/55 (!) 138/103  Pulse:  (!) 113 (!) 108 (!) 110  Resp:  (!) 36 (!) 28 (!) 21  Temp:      TempSrc:      SpO2:  97% 96% 95%  PainSc: 7        Isolation Precautions No active isolations  Medications Medications  enoxaparin (LOVENOX) injection 40 mg (has no administration in time range)  acetaminophen (TYLENOL) tablet 650 mg (has no administration in time range)    Or  acetaminophen (TYLENOL) suppository 650 mg (has no administration in time range)  guaiFENesin (MUCINEX) 12 hr tablet 600 mg (has no administration in time range)  ipratropium-albuterol (DUONEB) 0.5-2.5 (3) MG/3ML nebulizer solution 3 mL (3 mLs Nebulization Not Given 01/26/19 2158)  albuterol (PROVENTIL) (2.5 MG/3ML) 0.083% nebulizer solution 2.5 mg (has no administration in time range)  methylPREDNISolone sodium succinate (SOLU-MEDROL) 40 mg/mL injection 40 mg (has no administration in time range)  montelukast (SINGULAIR) tablet 10 mg (has no administration in time range)  fluticasone (FLONASE) 50 MCG/ACT nasal spray 1 spray (has no administration in time range)  pantoprazole (PROTONIX) EC tablet 20 mg (has no administration in time range)  methylPREDNISolone sodium succinate (SOLU-MEDROL) 125 mg/2 mL injection 125 mg (125 mg Intravenous Given 01/26/19 2020)  albuterol (PROVENTIL) (2.5 MG/3ML) 0.083% nebulizer solution 5 mg (5 mg  Nebulization Given 01/26/19 2020)  magnesium sulfate IVPB 2 g 50 mL (0 g Intravenous Stopped 01/26/19 2124)  albuterol (PROVENTIL) (2.5 MG/3ML) 0.083% nebulizer solution 5 mg (5 mg Nebulization Given 01/26/19 2129)  ipratropium (ATROVENT) nebulizer solution 1 mg (1 mg Nebulization Given 01/26/19 2129)    Mobility manual wheelchair

## 2019-01-26 NOTE — ED Provider Notes (Signed)
[facility]-EMERGENCY DEPT Provider Note   CSN: [id number] Arrival date & time: 01/26/19  1959     History   Chief Complaint Chief Complaint  Patient presents with  . Shortness of Breath    HPI Sonia Drake is a 63 y.o. female hx of COPD, CHF, on 2 L [place] at baseline, who presented with shortness of breath.  Patient has been having shortness of breath since yesterday.  She came to the ED yesterday and was given IV steroids and nebulizer treatment her COVID was negative.  She states that she felt better while in the ER and she had less wheezing on discharge. however today she started wheezing again and has severe shortness of breath.  She was noted to be hypoxic 65% in triage and was brought into the room immediately.      The history is provided by the patient.    Past Medical History:  Diagnosis Date  . Arthritis    "right knee; left hip" (04/07/2017)  . Asthma   . CHF (congestive heart failure) (HCC)    reported treatment for fluid overload in the setting of asthma exacerbation ~ 1998  . COPD (chronic obstructive pulmonary disease) (HCC)    patient states she does not have COPD but was treated for it.   . 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 Dyspnea   . Dysrhythmia    occasional skipped beats  . GERD (gastroesophageal reflux disease)   . On home oxygen therapy    "I sleep with 2L" (04/07/2017)  . Pneumonia    "several times" (04/07/2017)  . Seizures (HCC) 2003 X 1   unknown cause    Patient Active Problem List   Diagnosis Date Noted  . GERD (gastroesophageal reflux disease) 02/16/2018  . Seizures (HCC) 02/16/2018  . History of prolonged Q-T interval on ECG 02/16/2018  . Bronchitis 11/11/2017  . Acute respiratory failure (HCC) 11/03/2017  . Respiratory distress   . Acute respiratory failure with hypoxia (HCC) 11/02/2017  . Chronic diastolic CHF (congestive heart failure) (HCC) 09/19/2017  . Primary osteoarthritis of left  hip 04/07/2017  . Osteoarthritis of left hip 04/02/2017  . Lung nodule 02/04/2016  . Asthma exacerbation with COPD (chronic obstructive pulmonary disease) (HCC) 07/21/2015  . Influenza B 04/23/2015  . Asthma exacerbation 04/22/2015  . Hypokalemia 04/22/2015  . 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 Influenza with pneumonia 07/03/2014  . Multiple lung nodules on CT 07/03/2014  . Asthma 07/02/2014  . Hyperglycemia 07/02/2014  . Chronic obstructive pulmonary disease with acute exacerbation (HCC)   . Prolonged QT interval 06/29/2014    Past Surgical History:  Procedure Laterality Date  . JOINT REPLACEMENT    . KNEE ARTHROSCOPY Right 2017  . 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      Home Medications    Prior to Admission medications   Medication Sig Start Date End Date Taking? Authorizing Provider  albuterol (PROVENTIL HFA;VENTOLIN HFA) 108 (90 Base) MCG/ACT inhaler Inhale 2 puffs into the lungs every 6 (six) hours as needed for wheezing or shortness of breath. 04/14/18   Shon Hale, MD  albuterol (PROVENTIL) (2.5 MG/3ML) 0.083% nebulizer solution Take 3 mLs (2.5 mg total) by nebulization every 6 (six)  hours as needed for wheezing or shortness of breath. 04/14/18   Shon Hale, MD  budesonide-formoterol (SYMBICORT) 160-4.5 MCG/ACT inhaler Inhale 2 puffs into the lungs 2 (two) times daily. 04/14/18   Shon Hale, MD  calcium-vitamin D (OSCAL WITH D) 500-200 MG-UNIT tablet Take 1 tablet by mouth daily with breakfast. 04/14/18   Shon Hale, MD  doxycycline (VIBRA-TABS) 100 MG tablet Take 1 tablet (100 mg total) by mouth 2 (two) times daily. 01/25/19   Linwood Dibbles, MD  fluticasone (FLONASE) 50 MCG/ACT nasal spray Place 1 spray into  both nostrils daily. 01/01/19   [provider]  guaiFENesin (MUCINEX) 600 MG 12 hr tablet Take 1 tablet (600 mg total) by mouth 2 (two) times daily. Patient not taking: Reported on 01/25/2019 04/14/18   Shon Hale, MD  montelukast (SINGULAIR) 10 MG tablet Take 1 tablet (10 mg total) by mouth daily. 04/14/18   Shon Hale, MD  pantoprazole (PROTONIX) 20 MG tablet Take 20 mg by mouth daily as needed for indigestion.    [provider]  predniSONE (STERAPRED UNI-PAK 21 TAB) 10 MG (21) TBPK tablet Take 6 tablets (60 mg total) by mouth daily for 2 days, THEN 5 tablets (50 mg total) daily for 2 days, THEN 4 tablets (40 mg total) daily for 2 days, THEN 3 tablets (30 mg total) daily for 2 days, THEN 2 tablets (20 mg total) daily for 2 days, THEN 1 tablet (10 mg total) daily for 2 days. 01/25/19 02/06/19  Linwood Dibbles, MD  tiotropium (SPIRIVA) 18 MCG inhalation capsule Place 1 capsule (18 mcg total) into inhaler and inhale daily. 04/14/18   Shon Hale, MD  triamcinolone lotion (KENALOG) 0.1 % Apply 1 application topically 2 (two) times daily as needed (for rash). 04/14/18   Shon Hale, MD    Family History Family History  Problem Relation Age of Onset  . Heart disease Mother   . Heart disease Father   . Diabetes Father   . Asthma Father   . Diabetes Brother   . Diabetes Sister   . Asthma Brother   . Asthma Sister   . Hypertension Sister     Social History Social History   Tobacco Use  . Smoking status: Never Smoker  . Smokeless tobacco: Never Used  Substance Use Topics  . Alcohol use: No  . Drug use: No     Allergies   Contrast media [iodinated diagnostic agents], Peanuts [peanut oil], Shellfish allergy, Soap, Latex, and Eggs or egg-derived products   Review of Systems Review of Systems  Respiratory: Positive for shortness of breath.   All other systems reviewed and are negative.    Physical Exam Updated Vital Signs BP (!) 153/80 (BP  Location: Left Arm)   Pulse (!) 130   Temp 98.3 F (36.8 C) (Oral)   Resp (!) 24   SpO2 100%   Physical Exam Vitals signs and nursing note reviewed.  Constitutional:      Comments: Tachypneic, short of breath   HENT:     Head: Normocephalic.  Eyes:     Extraocular Movements: Extraocular movements intact.     Pupils: Pupils are equal, round, and reactive to light.  Neck:     Musculoskeletal: Normal range of motion and neck supple.  Cardiovascular:     Rate and Rhythm: Regular rhythm. Tachycardia present.  Pulmonary:     Comments: Tachypneic, sitting up, talking in 3-4 word sentences. Moderate distress. Mild retractions. + diffuse expiratory wheezing  Abdominal:  General: Bowel sounds are normal.     Palpations: Abdomen is soft.  Musculoskeletal: Normal range of motion.  Skin:    General: Skin is warm.     Capillary Refill: Capillary refill takes less than 2 seconds.  Neurological:     General: No focal deficit present.     Mental Status: She is oriented to person, place, and time.  Psychiatric:        Mood and Affect: Mood normal.        Behavior: Behavior normal.      ED Treatments / Results  Labs (all labs ordered are listed, but only abnormal results are displayed) Labs Reviewed  CBC WITH DIFFERENTIAL/PLATELET - Abnormal; Notable for the following components:      Result Value   WBC 12.3 (*)    HCT 47.2 (*)    MCHC 29.2 (*)    Neutro Abs 10.8 (*)    Abs Immature Granulocytes 0.15 (*)    All other components within normal limits  BASIC METABOLIC PANEL - Abnormal; Notable for the following components:   Glucose, Bld 175 (*)    All other components within normal limits  BLOOD GAS, VENOUS    EKG EKG Interpretation  Date/Time:  Tuesday January 26 2019 20:08:59 EDT Ventricular Rate:  117 PR Interval:    QRS Duration: 78 QT Interval:  282 QTC Calculation: 394 R Axis:   80 Text Interpretation:  Sinus tachycardia Probable left atrial enlargement RSR' in  V1 or V2, right VCD or RVH Nonspecific T abnormalities, inferior leads No significant change since last tracing Confirmed by Richardean CanalYao, David H ([id number]) on 01/26/2019 8:48:44 PM   Radiology Dg Chest Port 1 View  Result Date: 01/26/2019 CLINICAL DATA:  Shortness of breath EXAM: PORTABLE CHEST 1 VIEW COMPARISON:  01/25/2019, 04/10/2018 FINDINGS: The patient's chin obscures the lung apices. No consolidation or effusion. Normal cardiomediastinal silhouette. No pneumothorax. IMPRESSION: No active disease. Electronically Signed   By: Jasmine PangKim  Fujinaga M.D.   On: 01/26/2019 20:41   Dg Chest Port 1 View  Result Date: 01/25/2019 CLINICAL DATA:  Shortness of breath.  Productive cough. EXAM: PORTABLE CHEST 1 VIEW COMPARISON:  02/08/2018 FINDINGS: The heart size and mediastinal contours are within normal limits. Both lungs are clear. The visualized skeletal structures are unremarkable. IMPRESSION: Normal exam. Electronically Signed   By: Francene BoyersJames  Maxwell M.D.   On: 01/25/2019 13:26    Procedures Procedures (including critical care time)  CRITICAL CARE Performed by: Richardean Canalavid H Yao   Total critical care time:30 minutes  Critical care time was exclusive of separately billable procedures and treating other patients.  Critical care was necessary to treat or prevent imminent or life-threatening deterioration.  Critical care was time spent personally by me on the following activities: development of treatment plan with patient and/or surrogate as well as nursing, discussions with consultants, evaluation of patient's response to treatment, examination of patient, obtaining history from patient or surrogate, ordering and performing treatments and interventions, ordering and review of laboratory studies, ordering and review of radiographic studies, pulse oximetry and re-evaluation of patient's condition.   Medications Ordered in ED Medications  magnesium sulfate IVPB 2 g 50 mL (2 g Intravenous New Bag/Given 01/26/19 2027)   albuterol (PROVENTIL) (2.5 MG/3ML) 0.083% nebulizer solution 5 mg (has no administration in time range)  ipratropium (ATROVENT) nebulizer solution 1 mg (has no administration in time range)  methylPREDNISolone sodium succinate (SOLU-MEDROL) 125 mg/2 mL injection 125 mg (125 mg Intravenous Given 01/26/19 2020)  albuterol (  PROVENTIL) (2.5 MG/3ML) 0.083% nebulizer solution 5 mg (5 mg Nebulization Given 01/26/19 2020)     Initial Impression / Assessment and Plan / ED Course  I have reviewed the triage vital signs and the nursing notes.  Pertinent labs & imaging results that were available during my care of the patient were reviewed by me and considered in my medical decision making (see chart for details).       TANE BIEGLER is a 63 y.o. female here with SOB. Was seen yesterday for COPD exacerbation and felt better with IV steroids. Symptoms improved in the ED and worsened after discharge. Patient is tachypneic, tachycardic, hypoxic. Will get labs, CXR. Will need admission for COPD exacerbation. Will give nebs, steroids, magnesium.   9:33 PM Still tachypneic and wheezing. Increased O2 to 5L [place] and O2 is now 100%. Labs and CXR unremarkable. Will admit for COPD exacerbation   Final Clinical Impressions(s) / ED Diagnoses   Final diagnoses:  None    ED Discharge Orders    None       Drenda Freeze, MD 01/26/19 2133

## 2019-01-26 NOTE — ED Triage Notes (Addendum)
Pt coming from home c/o shortness of breath that has gotten progressively worse over the last few hours. Used inhaler without relief. Hx of COPD and asthma. Initially 65% on room air. Usually on 2L at baseline

## 2019-01-26 NOTE — ED Notes (Signed)
Resp contacted for VBG in lab

## 2019-01-27 ENCOUNTER — Other Ambulatory Visit: Payer: Self-pay

## 2019-01-27 DIAGNOSIS — K219 Gastro-esophageal reflux disease without esophagitis: Secondary | ICD-10-CM | POA: Diagnosis present

## 2019-01-27 DIAGNOSIS — J45901 Unspecified asthma with (acute) exacerbation: Secondary | ICD-10-CM | POA: Diagnosis not present

## 2019-01-27 DIAGNOSIS — D72829 Elevated white blood cell count, unspecified: Secondary | ICD-10-CM | POA: Diagnosis present

## 2019-01-27 DIAGNOSIS — J9621 Acute and chronic respiratory failure with hypoxia: Secondary | ICD-10-CM | POA: Diagnosis present

## 2019-01-27 DIAGNOSIS — I5032 Chronic diastolic (congestive) heart failure: Secondary | ICD-10-CM | POA: Diagnosis present

## 2019-01-27 DIAGNOSIS — J9622 Acute and chronic respiratory failure with hypercapnia: Secondary | ICD-10-CM | POA: Diagnosis present

## 2019-01-27 DIAGNOSIS — Z825 Family history of asthma and other chronic lower respiratory diseases: Secondary | ICD-10-CM | POA: Diagnosis not present

## 2019-01-27 DIAGNOSIS — R911 Solitary pulmonary nodule: Secondary | ICD-10-CM | POA: Diagnosis present

## 2019-01-27 DIAGNOSIS — Z91010 Allergy to peanuts: Secondary | ICD-10-CM | POA: Diagnosis not present

## 2019-01-27 DIAGNOSIS — Z7951 Long term (current) use of inhaled steroids: Secondary | ICD-10-CM | POA: Diagnosis not present

## 2019-01-27 DIAGNOSIS — Z91013 Allergy to seafood: Secondary | ICD-10-CM | POA: Diagnosis not present

## 2019-01-27 DIAGNOSIS — Z7952 Long term (current) use of systemic steroids: Secondary | ICD-10-CM | POA: Diagnosis not present

## 2019-01-27 DIAGNOSIS — E872 Acidosis: Secondary | ICD-10-CM | POA: Diagnosis present

## 2019-01-27 DIAGNOSIS — Z96642 Presence of left artificial hip joint: Secondary | ICD-10-CM | POA: Diagnosis present

## 2019-01-27 DIAGNOSIS — Z91012 Allergy to eggs: Secondary | ICD-10-CM | POA: Diagnosis not present

## 2019-01-27 DIAGNOSIS — Z91048 Other nonmedicinal substance allergy status: Secondary | ICD-10-CM | POA: Diagnosis not present

## 2019-01-27 DIAGNOSIS — Z91040 Latex allergy status: Secondary | ICD-10-CM | POA: Diagnosis not present

## 2019-01-27 DIAGNOSIS — Z91041 Radiographic dye allergy status: Secondary | ICD-10-CM | POA: Diagnosis not present

## 2019-01-27 DIAGNOSIS — J441 Chronic obstructive pulmonary disease with (acute) exacerbation: Secondary | ICD-10-CM | POA: Diagnosis present

## 2019-01-27 DIAGNOSIS — T380X5A Adverse effect of glucocorticoids and synthetic analogues, initial encounter: Secondary | ICD-10-CM | POA: Diagnosis present

## 2019-01-27 DIAGNOSIS — Z6834 Body mass index (BMI) 34.0-34.9, adult: Secondary | ICD-10-CM | POA: Diagnosis not present

## 2019-01-27 DIAGNOSIS — R0602 Shortness of breath: Secondary | ICD-10-CM | POA: Diagnosis present

## 2019-01-27 DIAGNOSIS — Z833 Family history of diabetes mellitus: Secondary | ICD-10-CM | POA: Diagnosis not present

## 2019-01-27 DIAGNOSIS — Z9981 Dependence on supplemental oxygen: Secondary | ICD-10-CM | POA: Diagnosis not present

## 2019-01-27 DIAGNOSIS — Z20828 Contact with and (suspected) exposure to other viral communicable diseases: Secondary | ICD-10-CM | POA: Diagnosis present

## 2019-01-27 LAB — BASIC METABOLIC PANEL
Anion gap: 10 (ref 5–15)
BUN: 11 mg/dL (ref 8–23)
CO2: 30 mmol/L (ref 22–32)
Calcium: 10.4 mg/dL — ABNORMAL HIGH (ref 8.9–10.3)
Chloride: 100 mmol/L (ref 98–111)
Creatinine, Ser: 0.59 mg/dL (ref 0.44–1.00)
GFR calc Af Amer: >60 mL/min (ref >60)
GFR calc non Af Amer: >60 mL/min (ref >60)
Glucose, Bld: 144 mg/dL — ABNORMAL HIGH (ref 70–99)
Potassium: 4.9 mmol/L (ref 3.5–5.1)
Sodium: 140 mmol/L (ref 135–145)

## 2019-01-27 LAB — BLOOD GAS, ARTERIAL
Acid-Base Excess: 6.9 mmol/L — ABNORMAL HIGH (ref 0.0–2.0)
Bicarbonate: 36.4 mmol/L — ABNORMAL HIGH (ref 20.0–28.0)
Drawn by: 441261
O2 Content: 2 L/min
O2 Saturation: 96.5 %
Patient temperature: 98.6
pCO2 arterial: 80 mmHg (ref 32.0–48.0)
pH, Arterial: 7.28 — ABNORMAL LOW (ref 7.350–7.450)
pO2, Arterial: 83.2 mmHg (ref 83.0–108.0)

## 2019-01-27 LAB — CBC
HCT: 49.8 % — ABNORMAL HIGH (ref 36.0–46.0)
Hemoglobin: 14.2 g/dL (ref 12.0–15.0)
MCH: 28.6 pg (ref 26.0–34.0)
MCHC: 28.5 g/dL — ABNORMAL LOW (ref 30.0–36.0)
MCV: 100.2 fL — ABNORMAL HIGH (ref 80.0–100.0)
Platelets: 214 10*3/uL (ref 150–400)
RBC: 4.97 MIL/uL (ref 3.87–5.11)
RDW: 15.4 % (ref 11.5–15.5)
WBC: 12.3 10*3/uL — ABNORMAL HIGH (ref 4.0–10.5)
nRBC: 0 % (ref 0.0–0.2)

## 2019-01-27 LAB — MRSA PCR SCREENING: MRSA by PCR: NEGATIVE

## 2019-01-27 MED ORDER — IPRATROPIUM-ALBUTEROL 0.5-2.5 (3) MG/3ML IN SOLN
3.0000 mL | RESPIRATORY_TRACT | Status: DC
  Administered 2019-01-27 – 2019-01-29 (×15): 3 mL via RESPIRATORY_TRACT
  Filled 2019-01-27 (×16): qty 3

## 2019-01-27 MED ORDER — SODIUM CHLORIDE 0.9% FLUSH
10.0000 mL | Freq: Two times a day (BID) | INTRAVENOUS | Status: DC
  Administered 2019-01-27 (×2): 10 mL
  Administered 2019-01-28: 20 mL
  Administered 2019-01-28 – 2019-01-30 (×4): 10 mL

## 2019-01-27 MED ORDER — BUDESONIDE 0.25 MG/2ML IN SUSP
0.2500 mg | Freq: Two times a day (BID) | RESPIRATORY_TRACT | Status: DC
  Administered 2019-01-27 – 2019-01-30 (×7): 0.25 mg via RESPIRATORY_TRACT
  Filled 2019-01-27 (×7): qty 2

## 2019-01-27 MED ORDER — METHYLPREDNISOLONE SODIUM SUCC 40 MG IJ SOLR
40.0000 mg | Freq: Four times a day (QID) | INTRAMUSCULAR | Status: DC
  Administered 2019-01-27 – 2019-01-28 (×4): 40 mg via INTRAVENOUS
  Filled 2019-01-27 (×4): qty 1

## 2019-01-27 MED ORDER — CHLORHEXIDINE GLUCONATE 0.12 % MT SOLN
15.0000 mL | Freq: Two times a day (BID) | OROMUCOSAL | Status: DC
  Administered 2019-01-27 – 2019-01-28 (×3): 15 mL via OROMUCOSAL
  Filled 2019-01-27: qty 15

## 2019-01-27 MED ORDER — ONDANSETRON HCL 4 MG/2ML IJ SOLN
4.0000 mg | Freq: Four times a day (QID) | INTRAMUSCULAR | Status: DC | PRN
  Administered 2019-01-27 – 2019-01-28 (×5): 4 mg via INTRAVENOUS
  Filled 2019-01-27 (×5): qty 2

## 2019-01-27 MED ORDER — SODIUM CHLORIDE 0.9 % IV SOLN
500.0000 mg | INTRAVENOUS | Status: DC
  Administered 2019-01-27 – 2019-01-29 (×3): 500 mg via INTRAVENOUS
  Filled 2019-01-27 (×3): qty 500

## 2019-01-27 MED ORDER — SODIUM CHLORIDE 0.9% FLUSH: 10.0000 mL | INTRAVENOUS | Status: DC | PRN

## 2019-01-27 MED ORDER — METHYLPREDNISOLONE SODIUM SUCC 125 MG IJ SOLR
125.0000 mg | Freq: Once | INTRAMUSCULAR | Status: AC
  Administered 2019-01-27: 125 mg via INTRAVENOUS
  Filled 2019-01-27: qty 2

## 2019-01-27 MED ORDER — PROCHLORPERAZINE EDISYLATE 10 MG/2ML IJ SOLN
5.0000 mg | Freq: Once | INTRAMUSCULAR | Status: AC
  Administered 2019-01-27: 5 mg via INTRAVENOUS
  Filled 2019-01-27: qty 2

## 2019-01-27 MED ORDER — ORAL CARE MOUTH RINSE
15.0000 mL | Freq: Two times a day (BID) | OROMUCOSAL | Status: DC
  Administered 2019-01-27 (×2): 15 mL via OROMUCOSAL

## 2019-01-27 MED ORDER — CHLORHEXIDINE GLUCONATE CLOTH 2 % EX PADS
6.0000 | MEDICATED_PAD | Freq: Every day | CUTANEOUS | Status: DC
  Administered 2019-01-27 – 2019-01-30 (×4): 6 via TOPICAL

## 2019-01-27 NOTE — Progress Notes (Addendum)
Patient gets nauseous from solumedrol. Day RN gave zofran 4mg  IV 15-20 minutes prior to giving solu-medrol. Too early to give zofran but solu-medrol is scheduled. Paged Baltazar Najjar. New order for one time dose 5mg  IV compazine ordered and given. Will continue to monitor.

## 2019-01-27 NOTE — Progress Notes (Signed)
Patient transported to ICU without complication on BiPAP.

## 2019-01-27 NOTE — Progress Notes (Addendum)
Pt experiencing SOB with apparent dyspnea. MD and RT paged. MD, RT and Rapid Response RN assessed pt and agree pt requiring transfer to higher level of care. Pt placed on BIPAP by RT and repeat ABGs drawn. Rapid Response RN to stay with pt until transfer.

## 2019-01-27 NOTE — Progress Notes (Signed)
Sonia Drake verbally gave me permission to call her niece, Sonia Drake to give an update and let her know we were moving her to a room in the ICU/SD. Sonia Drake continues to state she does not want any visitors, just to give updates on the phone. Sonia Drake reported her sister, Sonia Drake is the HPOA.

## 2019-01-27 NOTE — Progress Notes (Signed)
PROGRESS NOTE  Sonia Drake  DOB: Nov 26, 1955  PCP: Bartholome Bill, MD WUJ:[id number]  DOA: 01/26/2019  LOS: 0 days   Brief narrative: Patient is a 63 y.o. female with medical history significant for asthma with COPD and chronic respiratory failure on 2 L supplemental O2 via [place] at home, chronic diastolic CHF, and GERD. She presented to the ED on 10/6 with complaint of shortness of breath and wheezing not responding to bronchodilators, doxycycline and steroids started 1 day prior to presentation. In the ED, patient was tachypneic to 24, tachycardic to 120, afebrile.  O2 sat was 65% on 2 L and improved to more than 95% on higher flow oxygen by nasal cannula.  WBC count 12.3, potassium 4.7, renal function normal. Blood gas showed an acidotic pH of 7.23, PCO2 elevated to 82, PO2 62. Portable chest x-ray was negative for focal consolidation, effusion, or edema. COVID-19 test negative.   Patient was given IV Solu-Medrol 125 mg, IV magnesium, albuterol and Atrovent nebulizer treatments. Admitted under hospitalist service.   Subjective: Rapid response was called this morning.  Patient was more dyspneic.  She had diffuse expiratory wheezing and diminished breath sounds. I attended the patient immediately.  Alert, awake, oriented x3, able to speak in short sentences, tachypneic, able to maintain oxygen saturation more than 90% on 2 L. Has diffuse expiratory wheezing. Repeat blood gas this morning showed pH 7.28, PCO2 80. We will start the patient on BiPAP and move her to stepdown.  Assessment/Plan: Acute on chronic hypoxic hypercapnic respiratory failure  Acute COPD exacerbation -Presented with shortness of breath wheezing. -O2 sat maintained on oxygen supplementation but blood gas shows significant hypercapnia to 82 with respiratory acidosis.  Her serum bicarb traditionally runs less than 30.  I would resume her baseline CO2 level runs close to 60.   -Start on BiPAP.  I will keep her  on BiPAP overnight with breaks for food.   -On admission, patient was started on IV Solu-Medrol 40 mg twice daily, I will give her a dose of Solu-Medrol 125 mg once and increase frequency to 40 mg every 6 hours.   -No infiltrate on chest x-ray.  May have atypical bronchitis.  Will start on azithromycin 5 mg daily for 3 days.   -Continue Singulair  Chronic diastolic CHF: -Echo from 9562 with EF 60 to 65% and grade 2 diastolic dysfunction. -Currently not in decompensated heart failure. -Continue to monitor volume status  GERD: Continue PPI.  Mobility: Encourage ambulation DVT prophylaxis:  Lovenox Code Status:   Code Status: Full Code  Family Communication:  Patient has self discussed with family Expected Discharge:  Continue inpatient care Admission status: Patient has been admitted under observation status.  Because of worsening hypercapnia and the need of BiPAP and frequent monitoring, patient needs to be switched to inpatient status.  Consultants:  None  Procedures:  None  Antimicrobials: Anti-infectives (From admission, onward)   None      Diet Order            Diet Heart Room service appropriate? Yes; Fluid consistency: Thin  Diet effective now              Infusions:    Scheduled Meds: . budesonide (PULMICORT) nebulizer solution  0.25 mg Nebulization BID  . chlorhexidine  15 mL Mouth Rinse BID  . Chlorhexidine Gluconate Cloth  6 each Topical Daily  . enoxaparin (LOVENOX) injection  40 mg Subcutaneous QHS  . fluticasone  1 spray Each Nare Daily  .  guaiFENesin  600 mg Oral BID  . ipratropium-albuterol  3 mL Nebulization Q4H  . mouth rinse  15 mL Mouth Rinse q12n4p  . methylPREDNISolone (SOLU-MEDROL) injection  40 mg Intravenous Q6H  . montelukast  10 mg Oral Daily  . sodium chloride flush  10-40 mL Intracatheter Q12H    PRN meds: acetaminophen **OR** acetaminophen, albuterol, ondansetron (ZOFRAN) IV, pantoprazole, sodium chloride flush   Objective:  Vitals:   01/27/19 0915 01/27/19 0944  BP:    Pulse: 98 99  Resp: (!) 29 (!) 24  Temp:    SpO2: 96% 97%    Intake/Output Summary (Last 24 hours) at 01/27/2019 1035 Last data filed at 01/27/2019 1029 Gross per 24 hour  Intake 60 ml  Output -  Net 60 ml   Filed Weights   01/26/19 2230 01/27/19 0600  Weight: 90 kg 90 kg   Weight change:  Body mass index is 34.06 kg/m.   Physical Exam: General exam: More respiratory distress.  Able to answer in short sentences Skin: No rashes, lesions or ulcers. HEENT: Atraumatic, normocephalic, supple neck, no obvious bleeding Lungs: Diffuse bilateral wheezing, diminished air entry in both bases CVS: Regular rate and rhythm, no murmur GI/Abd soft, nontender nondistended, bowel sound present CNS: Alert, awake and oriented x3 Psychiatry: Mood appropriate, judgment and insight clear Extremities: No pedal edema, no calf tenderness  Data Review: I have personally reviewed the laboratory data and studies available.  Recent Labs  Lab 01/25/19 1246 01/26/19 2024 01/27/19 0507  WBC 7.6 12.3* 12.3*  NEUTROABS 5.3 10.8*  --   HGB 14.2 13.8 14.2  HCT 47.2* 47.2* 49.8*  MCV 95.0 97.7 100.2*  PLT 232 230 214   Recent Labs  Lab 01/25/19 1246 01/26/19 2024 01/27/19 0507  NA 140 140 140  K 3.6 4.7 4.9  CL 104 103 100  CO2 29 24 30   GLUCOSE 122* 175* 144*  BUN 8 12 11   CREATININE 0.83 0.70 0.59  CALCIUM 9.7 10.2 10.4*    , MD  [facility] 01/27/2019

## 2019-01-27 NOTE — Progress Notes (Signed)
Called to room for SOB. When arrived to room, patient on 2L [place] (pt stated she wears 2L [place] at home). Patient able to speak in short sentences, with dyspnea, accessory muscle use. RT at bedside.  Patient A&Ox4 HR NSR/ST, generalized edema Lung sounds course, expiratory wheezes, diminished in bases, patient reports feeling "tight" in her lungs, RR 20-30s.   MD arrived at bedside, new orders received including  order to transfer to SDU and bipap. RT placed patient on bipap 30% FIO2. RRT will remain with patient until SDU bed available.

## 2019-01-28 MED ORDER — CHLORHEXIDINE GLUCONATE 0.12 % MT SOLN
15.0000 mL | Freq: Two times a day (BID) | OROMUCOSAL | Status: DC
  Administered 2019-01-28 – 2019-01-30 (×4): 15 mL via OROMUCOSAL
  Filled 2019-01-28: qty 15

## 2019-01-28 MED ORDER — FLEET ENEMA 7-19 GM/118ML RE ENEM
1.0000 | ENEMA | Freq: Once | RECTAL | Status: AC
  Administered 2019-01-28: 1 via RECTAL
  Filled 2019-01-28: qty 1

## 2019-01-28 MED ORDER — POLYETHYLENE GLYCOL 3350 17 G PO PACK
17.0000 g | PACK | Freq: Every day | ORAL | Status: DC
  Administered 2019-01-28: 17 g via ORAL
  Filled 2019-01-28 (×3): qty 1

## 2019-01-28 MED ORDER — METHYLPREDNISOLONE SODIUM SUCC 125 MG IJ SOLR
INTRAMUSCULAR | Status: AC
  Filled 2019-01-28: qty 2

## 2019-01-28 MED ORDER — METHYLPREDNISOLONE SODIUM SUCC 125 MG IJ SOLR
80.0000 mg | Freq: Three times a day (TID) | INTRAMUSCULAR | Status: DC
  Administered 2019-01-28 – 2019-01-29 (×3): 80 mg via INTRAVENOUS
  Filled 2019-01-28 (×3): qty 2

## 2019-01-28 MED ORDER — ORAL CARE MOUTH RINSE
15.0000 mL | Freq: Two times a day (BID) | OROMUCOSAL | Status: DC
  Administered 2019-01-29: 15 mL via OROMUCOSAL

## 2019-01-28 NOTE — Progress Notes (Signed)
Hospitalist progress note  Sonia Drake  XLK:[id number] DOB: Sep 13, 1955 DOA: 01/26/2019 PCP: Bartholome Bill, MDNarrative:  34 BF asthma/COPD on home oxygen-previously followed [place] Dr. Agustin Cree lung nodule seizure disorder prolonged QT prior seizure HFpEF reflux L THR 2018 Initial presentation ED 01/25/2019 S OB + wheeze Rx asthma DC home + doxycycline/prednisone-symptoms worsen return to ED 10/6 severely hypoxic O2 sat 65% needed 5 L tachycardic 120-Rx Solu-Medrol magnesium in ED-decompensated with Co2 on ABg 80's on telemetry floor transfer to stepdown on BiPAP Assessment & Plan:   Acute hypoxic respiratory failure 2/2 asthma/COPD much improved has been off BiPAP since 0900 hrs. this morning-currently on 3 L able to verbalize without issue-continue Solu-Medrol changed to 80 q. 8 given persisting wheeze azithromycin for COPD to continue Proventil neb every 4 Flonase Singulair guaifenesin and duo nebs in addition to Pulmicort-keep on stepdown today (intubated 2003 for asthma) given recent decompensation likely transfer to floor a.m. if all stable-get cxr 2 vw am-VO to nursing for diet  Morbid obesity-likely restrictive disease in addition-monitor trends and low threshold to use BiPAP if needed  Prior seizure disorder not currently on any seizure medications monitor trends  Prolonged QT some sinus tach keep on monitor overnight  HFpEF not on any diuretics at present time no acute decompensation-monitor trends monitor weight daily  Reflux-protonix   L THR 2018-h/o of  Lung nodule-needs CT of chest as OP  DVT prophylaxis: lovenox  Code Status:   full   Family Communication:   None present at this time she has updated them herself she states disposition Plan: inpatient  Consultants:   none  Procedures:   n  Antimicrobials:   azithro  Subjective: Coherent alert has been off Bipap early today and seems to be tol fair on 3 liters  Verbalizes well no acc ,muslce usage  - cp -  f -cough- +wheeze sitting up in chair  Objective: Vitals:   01/28/19 0430 01/28/19 0500 01/28/19 0600 01/28/19 0641  BP:  (!) 124/50 (!) 125/58   Pulse:  89 88   Resp:  17 18   Temp: (!) 97.5 F (36.4 C)   97.7 F (36.5 C)  TempSrc:      SpO2:  91% 91%   Weight:      Height:        Intake/Output Summary (Last 24 hours) at 01/28/2019 0729 Last data filed at 01/28/2019 0332 Gross per 24 hour  Intake 20.4 ml  Output -  Net 20.4 ml   Filed Weights   01/26/19 2230 01/27/19 0600 01/28/19 0353  Weight: 90 kg 90 kg 88.9 kg    Examination: Awake coherent no distress work of breathing minimal on oxygen via nasal cannula Wheezy throughout bilaterally and bilateral posteriorly no rales no rhonchi Abdomen soft nontender nondistended no rebound Neurologically intact no focal deficit Moves all 4 limbs equally without deficit Psych euthymic congruent No lower extremity edema   Data Reviewed: I have personally reviewed following labs and imaging studies  CBC: Recent Labs  Lab 01/25/19 1246 01/26/19 2024 01/27/19 0507  WBC 7.6 12.3* 12.3*  NEUTROABS 5.3 10.8*  --   HGB 14.2 13.8 14.2  HCT 47.2* 47.2* 49.8*  MCV 95.0 97.7 100.2*  PLT 232 230 366   Basic Metabolic Panel: Recent Labs  Lab 01/25/19 1246 01/26/19 2024 01/27/19 0507  NA 140 140 140  K 3.6 4.7 4.9  CL 104 103 100  CO2 29 24 30   GLUCOSE 122* 175* 144*  BUN  8 12 11   CREATININE 0.83 0.70 0.59  CALCIUM 9.7 10.2 10.4*   GFR: Estimated Creatinine Clearance: 75.7 mL/min (by C-G formula based on SCr of 0.59 mg/dL). Liver Function Tests: Recent Labs  Lab 01/25/19 1246  AST 29  ALT 22  ALKPHOS 93  BILITOT 0.6  PROT 8.1  ALBUMIN 4.2   No results for input(s): LIPASE, AMYLASE in the last 168 hours. No results for input(s): AMMONIA in the last 168 hours. Coagulation Profile: No results for input(s): INR, PROTIME in the last 168 hours. Cardiac Enzymes:  Radiology Studies: Reviewed images personally in  health database   Scheduled Meds: . budesonide (PULMICORT) nebulizer solution  0.25 mg Nebulization BID  . chlorhexidine  15 mL Mouth Rinse BID  . Chlorhexidine Gluconate Cloth  6 each Topical Daily  . enoxaparin (LOVENOX) injection  40 mg Subcutaneous QHS  . fluticasone  1 spray Each Nare Daily  . guaiFENesin  600 mg Oral BID  . ipratropium-albuterol  3 mL Nebulization Q4H  . mouth rinse  15 mL Mouth Rinse q12n4p  . methylPREDNISolone (SOLU-MEDROL) injection  40 mg Intravenous Q6H  . montelukast  10 mg Oral Daily  . sodium chloride flush  10-40 mL Intracatheter Q12H   Continuous Infusions: . azithromycin Stopped (01/27/19 1210)     LOS: 1 day    Time spent: 60  31, MD [facility]  If 7PM-7AM, please contact night-coverage-look on AMION to find my number otherwise-prefer pages-not epic chat,please 01/28/2019, 7:29 AM

## 2019-01-29 ENCOUNTER — Inpatient Hospital Stay (HOSPITAL_COMMUNITY): Payer: Medicare Other

## 2019-01-29 ENCOUNTER — Encounter (HOSPITAL_COMMUNITY): Payer: Self-pay

## 2019-01-29 LAB — CBC WITH DIFFERENTIAL/PLATELET
Abs Immature Granulocytes: 0.09 10*3/uL — ABNORMAL HIGH (ref 0.00–0.07)
Basophils Absolute: 0 10*3/uL (ref 0.0–0.1)
Basophils Relative: 0 %
Eosinophils Absolute: 0 10*3/uL (ref 0.0–0.5)
Eosinophils Relative: 0 %
HCT: 45.8 % (ref 36.0–46.0)
Hemoglobin: 13.2 g/dL (ref 12.0–15.0)
Immature Granulocytes: 1 %
Lymphocytes Relative: 11 %
Lymphs Abs: 1.4 10*3/uL (ref 0.7–4.0)
MCH: 28.5 pg (ref 26.0–34.0)
MCHC: 28.8 g/dL — ABNORMAL LOW (ref 30.0–36.0)
MCV: 98.9 fL (ref 80.0–100.0)
Monocytes Absolute: 0.5 10*3/uL (ref 0.1–1.0)
Monocytes Relative: 4 %
Neutro Abs: 10.8 10*3/uL — ABNORMAL HIGH (ref 1.7–7.7)
Neutrophils Relative %: 84 %
Platelets: 222 10*3/uL (ref 150–400)
RBC: 4.63 MIL/uL (ref 3.87–5.11)
RDW: 14.5 % (ref 11.5–15.5)
WBC: 12.8 10*3/uL — ABNORMAL HIGH (ref 4.0–10.5)
nRBC: 0 % (ref 0.0–0.2)

## 2019-01-29 LAB — BASIC METABOLIC PANEL
Anion gap: 8 (ref 5–15)
BUN: 15 mg/dL (ref 8–23)
CO2: 40 mmol/L — ABNORMAL HIGH (ref 22–32)
Calcium: 10.4 mg/dL — ABNORMAL HIGH (ref 8.9–10.3)
Chloride: 91 mmol/L — ABNORMAL LOW (ref 98–111)
Creatinine, Ser: 0.68 mg/dL (ref 0.44–1.00)
GFR calc Af Amer: >60 mL/min (ref >60)
GFR calc non Af Amer: >60 mL/min (ref >60)
Glucose, Bld: 167 mg/dL — ABNORMAL HIGH (ref 70–99)
Potassium: 5 mmol/L (ref 3.5–5.1)
Sodium: 139 mmol/L (ref 135–145)

## 2019-01-29 MED ORDER — PREDNISONE 50 MG PO TABS
60.0000 mg | ORAL_TABLET | Freq: Every day | ORAL | Status: DC
  Administered 2019-01-30: 60 mg via ORAL
  Filled 2019-01-29: qty 1

## 2019-01-29 MED ORDER — AZITHROMYCIN 250 MG PO TABS
500.0000 mg | ORAL_TABLET | Freq: Every day | ORAL | Status: DC
  Administered 2019-01-30: 500 mg via ORAL
  Filled 2019-01-29: qty 2

## 2019-01-29 MED ORDER — IPRATROPIUM-ALBUTEROL 0.5-2.5 (3) MG/3ML IN SOLN
3.0000 mL | Freq: Four times a day (QID) | RESPIRATORY_TRACT | Status: DC
  Administered 2019-01-29 – 2019-01-30 (×3): 3 mL via RESPIRATORY_TRACT
  Filled 2019-01-29 (×3): qty 3

## 2019-01-29 NOTE — Progress Notes (Signed)
Received patient from [place] at 40

## 2019-01-29 NOTE — Progress Notes (Signed)
Hospitalist progress note  Sonia Drake  MGQ:[id number] DOB: 01/15/1956 DOA: 01/26/2019 PCP: Bartholome Bill, MD  Narrative:  21 BF asthma/COPD on home oxygen-previously followed [place] Dr. Agustin Cree lung nodule seizure disorder prolonged QT prior seizure HFpEF reflux L THR 2018 Initial presentation ED 01/25/2019 SOB+wheeze Rx asthma DC home + doxycycline/prednisone--->symptoms worsen return to ED 10/6 severely hypoxic O2 sat 65% needed 5 L tachycardic 120-Rx Solu-Medrol magnesium in ED-decompensated with Co2 on ABg 80's on telemetry floor transfer to stepdown on BiPAP Assessment & Plan:   Acute hypoxic respiratory failure 2/2 asthma/COPD-using BiPAP hs, qd on 3 L -Solu-Medrol changed to 80 q. 8-> po prednisone daily- azithromycin changed ot PO for COPD-cont Proventil neb- every 4 Flonase Singulair guaifenesin and duo nebs in addition to Pulmicort-keep on stepdown - (intubated 2003 for asthma) given recent decompensation 2 vw CXR was neg today without any finding.  Morbid obesity-mild restrictive disease in addition-monitor trends and low threshold to use BiPAP if needed  Prior seizure disorder-was on this for febrile seizure like episodes in the past  Prolonged QT some sinus tach keep on monitor overnight  HFpEF not on any diuretics currently, monitor trends   Reflux-protonix   L THR 2018-h/o of  Lung nodule-needs CT of chest as OP  DVT prophylaxis: lovenox  Code Status:   full   Family Communication:   None present at this time she has updated them herself she states disposition Plan: inpatient  Consultants:   none  Procedures:   n  Antimicrobials:   azithro  Subjective:  Awake coherent pleasant is in nad-not using Bipap and has been stable over past day or so--feeling in good spirits  Objective: Vitals:   01/29/19 1000 01/29/19 1100 01/29/19 1152 01/29/19 1200  BP: (!) 141/78 (!) 164/73  (!) 185/68  Pulse: 95 99  88  Resp: 18 (!) 22  (!) 26  Temp:       TempSrc:      SpO2: 95% 96% 94% 96%  Weight:      Height:        Intake/Output Summary (Last 24 hours) at 01/29/2019 1304 Last data filed at 01/28/2019 2107 Gross per 24 hour  Intake 259.46 ml  Output 425 ml  Net -165.54 ml   Filed Weights   01/27/19 0600 01/28/19 0353 01/29/19 0500  Weight: 90 kg 88.9 kg 88.4 kg    Examination: On 3 liters Less wheeze  Clear bilateral posteriorly no rales no rhonchi Abdomen soft nontender nondistended no rebound Neurologically intact no focal deficit Moves all 4 limbs equally without deficit Psych euthymic congruent No lower extremity edema   Data Reviewed: I have personally reviewed following labs and imaging studies  CBC: Recent Labs  Lab 01/25/19 1246 01/26/19 2024 01/27/19 0507 01/29/19 0401  WBC 7.6 12.3* 12.3* 12.8*  NEUTROABS 5.3 10.8*  --  10.8*  HGB 14.2 13.8 14.2 13.2  HCT 47.2* 47.2* 49.8* 45.8  MCV 95.0 97.7 100.2* 98.9  PLT 232 230 214 267   Basic Metabolic Panel: Recent Labs  Lab 01/25/19 1246 01/26/19 2024 01/27/19 0507 01/29/19 0401  NA 140 140 140 139  K 3.6 4.7 4.9 5.0  CL 104 103 100 91*  CO2 29 24 30  40*  GLUCOSE 122* 175* 144* 167*  BUN 8 12 11 15   CREATININE 0.83 0.70 0.59 0.68  CALCIUM 9.7 10.2 10.4* 10.4*   GFR: Estimated Creatinine Clearance: 75.5 mL/min (by C-G formula based on SCr of 0.68 mg/dL). Liver Function Tests:  Recent Labs  Lab 01/25/19 1246  AST 29  ALT 22  ALKPHOS 93  BILITOT 0.6  PROT 8.1  ALBUMIN 4.2   No results for input(s): LIPASE, AMYLASE in the last 168 hours. No results for input(s): AMMONIA in the last 168 hours. Coagulation Profile: No results for input(s): INR, PROTIME in the last 168 hours. Cardiac Enzymes:  Radiology Studies: Reviewed images personally in health database   Scheduled Meds: . budesonide (PULMICORT) nebulizer solution  0.25 mg Nebulization BID  . chlorhexidine  15 mL Mouth Rinse BID  . Chlorhexidine Gluconate Cloth  6 each Topical Daily   . enoxaparin (LOVENOX) injection  40 mg Subcutaneous QHS  . fluticasone  1 spray Each Nare Daily  . guaiFENesin  600 mg Oral BID  . ipratropium-albuterol  3 mL Nebulization Q4H  . mouth rinse  15 mL Mouth Rinse q12n4p  . methylPREDNISolone (SOLU-MEDROL) injection  80 mg Intravenous Q8H  . montelukast  10 mg Oral Daily  . polyethylene glycol  17 g Oral Daily  . sodium chloride flush  10-40 mL Intracatheter Q12H   Continuous Infusions: . azithromycin 500 mg (01/29/19 1245)     LOS: 2 days    Time spent: 8  Pleas Koch, MD [facility]  If 7PM-7AM, please contact night-coverage-look on AMION to find my number otherwise-prefer pages-not epic chat,please 01/29/2019, 1:04 PM

## 2019-01-30 ENCOUNTER — Encounter (HOSPITAL_COMMUNITY): Payer: Self-pay

## 2019-01-30 LAB — BASIC METABOLIC PANEL
Anion gap: 7 (ref 5–15)
BUN: 20 mg/dL (ref 8–23)
CO2: 40 mmol/L — ABNORMAL HIGH (ref 22–32)
Calcium: 10.2 mg/dL (ref 8.9–10.3)
Chloride: 90 mmol/L — ABNORMAL LOW (ref 98–111)
Creatinine, Ser: 0.66 mg/dL (ref 0.44–1.00)
GFR calc Af Amer: >60 mL/min (ref >60)
GFR calc non Af Amer: >60 mL/min (ref >60)
Glucose, Bld: 139 mg/dL — ABNORMAL HIGH (ref 70–99)
Potassium: 4.4 mmol/L (ref 3.5–5.1)
Sodium: 137 mmol/L (ref 135–145)

## 2019-01-30 LAB — CULTURE, BLOOD (ROUTINE X 2)
Culture: NO GROWTH
Culture: NO GROWTH
Special Requests: ADEQUATE
Special Requests: ADEQUATE

## 2019-01-30 MED ORDER — ALBUTEROL SULFATE (2.5 MG/3ML) 0.083% IN NEBU: 2.5000 mg | INHALATION_SOLUTION | Freq: Four times a day (QID) | RESPIRATORY_TRACT | 1 refills | Status: AC | PRN

## 2019-01-30 MED ORDER — AZITHROMYCIN 250 MG PO TABS: 250.0000 mg | ORAL_TABLET | Freq: Every day | ORAL | 0 refills | Status: DC

## 2019-01-30 MED ORDER — PREDNISONE 20 MG PO TABS: 60.0000 mg | ORAL_TABLET | Freq: Every day | ORAL | 0 refills | Status: DC

## 2019-01-30 NOTE — Discharge Summary (Signed)
Physician Discharge Summary  Niel HummerMary J Digman GNF:[id number] DOB: [birth year] DOA: 01/26/2019  PCP: Verlon AuBoyd, Tammy Lamonica, MD  Admit date: 01/26/2019 Discharge date: 01/30/2019  Time spent: 35 minutes  Recommendations for Outpatient Follow-up:  1. Complete burst of steroids in addition to oral azithromycin 2. Needs outpatient follow-up for lung nodule with CT chest [facility] 3. Recommend labs 1 week at outpatient physician office 4. Refilled albuterol at patient request  Discharge Diagnoses:  Principal Problem:   Asthma with COPD with exacerbation (HCC) Active Problems:   Chronic diastolic CHF (congestive heart failure) (HCC)   Acute on chronic respiratory failure with hypoxia (HCC)   GERD (gastroesophageal reflux disease)   Acute and chronic respiratory failure with hypercapnia [facility](HCC)   Discharge Condition: Improved  Diet recommendation: Heart healthy  Filed Weights   01/28/19 0353 01/29/19 0500 01/30/19 0500  Weight: 88.9 kg 88.4 kg 90.8 kg    History of present illness:  63 BF asthma/COPD on home oxygen-previously followed [place] Dr. Liz MaladyBeuaford lung nodule seizure disorder prolonged QT prior seizure HFpEF reflux L THR 2018 Initial presentation ED 01/25/2019 SOB+wheeze Rx asthma DC home + doxycycline/prednisone--->symptoms worsen return to ED 10/6 severely hypoxic O2 sat 65% needed 5 L tachycardic 120-Rx Solu-Medrol magnesium in ED-decompensated with Co2 on ABg 80's on telemetry floor transfer to stepdown on BiPAP  Hospital Course:  Acute hypoxic respiratory failure 2/2 asthma/COPD-was placed during hospital stay on stepdown because of need for BiPAP but transitioned over 24 hours to usual 3 L -Solu-Medrol changed to 80 q. 8-> po prednisone daily with a prescription given on discharge for management- azithromycin changed ot PO to complete at home  Refilled albuterol nebs on discharge Outpatient does need x-ray in about 1 month versus CT scan as  below  Morbid obesity-mild restrictive disease in addition-outpatient management  Prior seizure disorder-was on this for febrile seizure like episodes in the past still is not on any meds  Prolonged QT some sinus tach keep on monitor overnight  HFpEF not on any diuretics currently, monitor trends   Reflux-protonix   L THR 2018-h/o of  Lung nodule-needs CT of chest as OP  Discharge Exam: Vitals:   01/30/19 0944 01/30/19 0945  BP:    Pulse:    Resp:    Temp:    SpO2: 98% 98%    General: Awake coherent much better overall feeling good ambulatory uses oxygen at home and is having her son bring these over Cardiovascular: S1-S2 no murmur rub or gallop  Respiratory: No rales no rhonchi no fremitus no resonance although does have some wheeze bilaterally posterolaterally which is not significantly worse than her prior  Discharge Instructions   Discharge Instructions    Diet - low sodium heart healthy   Complete by: As directed    Discharge instructions   Complete by: As directed    Take ur meds as indicated finish all steroids and anitbiotic Get labs at regular dr office in 1 week consoider cxr 1 week or so   Increase activity slowly   Complete by: As directed      Allergies as of 01/30/2019      Reactions   Contrast Media [iodinated Diagnostic Agents] Anaphylaxis, Other (See Comments)   ** CARDIAC ARREST **   Peanuts [peanut Oil] Shortness Of Breath, Swelling   Shellfish Allergy Shortness Of Breath, Swelling   Soap Shortness Of Breath, Swelling   LAUNDRY DETERGENT   Latex Hives   Eggs Or Egg-derived Products Rash  Medication List    STOP taking these medications   doxycycline 100 MG tablet Commonly known as: VIBRA-TABS   predniSONE 10 MG (21) Tbpk tablet Commonly known as: STERAPRED UNI-PAK 21 TAB Replaced by: predniSONE 20 MG tablet     TAKE these medications   albuterol 108 (90 Base) MCG/ACT inhaler Commonly known as: VENTOLIN HFA Inhale 2  puffs into the lungs every 6 (six) hours as needed for wheezing or shortness of breath.   albuterol (2.5 MG/3ML) 0.083% nebulizer solution Commonly known as: PROVENTIL Take 3 mLs (2.5 mg total) by nebulization every 6 (six) hours as needed for wheezing or shortness of breath.   azithromycin 250 MG tablet Commonly known as: ZITHROMAX Take 1 tablet (250 mg total) by mouth daily. Finish all these tabs Notes to patient: 01/31/2019   budesonide-formoterol 160-4.5 MCG/ACT inhaler Commonly known as: SYMBICORT Inhale 2 puffs into the lungs 2 (two) times daily. Notes to patient: 01/30/2019   calcium-vitamin D 500-200 MG-UNIT tablet Commonly known as: OSCAL WITH D Take 1 tablet by mouth daily with breakfast. Notes to patient: 01/31/2019   fluticasone 50 MCG/ACT nasal spray Commonly known as: FLONASE Place 1 spray into both nostrils daily. Notes to patient: 01/31/2019   guaiFENesin 600 MG 12 hr tablet Commonly known as: MUCINEX Take 1 tablet (600 mg total) by mouth 2 (two) times daily. Notes to patient: 01/30/2019   montelukast 10 MG tablet Commonly known as: SINGULAIR Take 1 tablet (10 mg total) by mouth daily. Notes to patient: 01/31/2019   pantoprazole 20 MG tablet Commonly known as: PROTONIX Take 20 mg by mouth daily as needed for indigestion.   predniSONE 20 MG tablet Commonly known as: DELTASONE Take 3 tablets (60 mg total) by mouth daily before breakfast. Start taking on: January 31, 2019 Replaces: predniSONE 10 MG (21) Tbpk tablet Notes to patient: 01/31/2019   tiotropium 18 MCG inhalation capsule Commonly known as: SPIRIVA Place 1 capsule (18 mcg total) into inhaler and inhale daily. Notes to patient: 01/30/2019   triamcinolone lotion 0.1 % Commonly known as: KENALOG Apply 1 application topically 2 (two) times daily as needed (for rash).      Allergies  Allergen Reactions  . Contrast Media [Iodinated Diagnostic Agents] Anaphylaxis and Other (See Comments)     ** CARDIAC ARREST **  . Peanuts [Peanut Oil] Shortness Of Breath and Swelling  . Shellfish Allergy Shortness Of Breath and Swelling  . Soap Shortness Of Breath and Swelling    LAUNDRY DETERGENT  . Latex Hives  . Eggs Or Egg-Derived Products Rash      The results of significant diagnostics from this hospitalization (including imaging, microbiology, ancillary and laboratory) are listed below for reference.    Significant Diagnostic Studies: Dg Chest 2 View  Result Date: 01/29/2019 CLINICAL DATA:  Increasing shortness of breath and wheezing over the past 3-4 days. EXAM: CHEST - 2 VIEW COMPARISON:  Single-view of the chest 01/26/2019 and 02/16/2018. FINDINGS: The lungs are clear. Heart size is normal. No pneumothorax or pleural effusion. No acute or focal bony abnormality. IMPRESSION: No acute disease. Electronically Signed   By: Drusilla Kanner M.D.   On: 01/29/2019 16:05   Dg Chest Port 1 View  Result Date: 01/26/2019 CLINICAL DATA:  Shortness of breath EXAM: PORTABLE CHEST 1 VIEW COMPARISON:  01/25/2019, 04/10/2018 FINDINGS: The patient's chin obscures the lung apices. No consolidation or effusion. Normal cardiomediastinal silhouette. No pneumothorax. IMPRESSION: No active disease. Electronically Signed   By: Adrian Prows.D.  On: 01/26/2019 20:41   Dg Chest Port 1 View  Result Date: 01/25/2019 CLINICAL DATA:  Shortness of breath.  Productive cough. EXAM: PORTABLE CHEST 1 VIEW COMPARISON:  02/08/2018 FINDINGS: The heart size and mediastinal contours are within normal limits. Both lungs are clear. The visualized skeletal structures are unremarkable. IMPRESSION: Normal exam. Electronically Signed   By: Francene Boyers M.D.   On: 01/25/2019 13:26    Microbiology: Recent Results (from the past 240 hour(s))  Culture, blood (routine x 2)     Status: None (Preliminary result)   Collection Time: 01/25/19  1:07 PM   Specimen: BLOOD  Result Value Ref Range Status   Specimen Description    Final    BLOOD LEFT ANTECUBITAL Performed at [facility], [address] [place], [place] [address]    Special Requests   Final    BOTTLES DRAWN AEROBIC AND ANAEROBIC Blood Culture adequate volume Performed at [facility], [address], [place], [place] [address]    Culture   Final    NO GROWTH 4 DAYS Performed at [facility], [address], [place], [place] [address]    Report Status PENDING  Incomplete  Culture, blood (routine x 2)     Status: None (Preliminary result)   Collection Time: 01/25/19  1:07 PM   Specimen: BLOOD  Result Value Ref Range Status   Specimen Description   Final    BLOOD RIGHT ANTECUBITAL Performed at [facility], [address] [place], [place] [address]    Special Requests   Final    BOTTLES DRAWN AEROBIC AND ANAEROBIC Blood Culture adequate volume Performed at [facility], [address], [place], [place] [address]    Culture   Final    NO GROWTH 4 DAYS Performed at [facility], [address], [place], [place] [address]    Report Status PENDING  Incomplete  SARS Coronavirus 2 [facility] order, Performed in [facility] hospital lab) Nasopharyngeal Nasopharyngeal Swab     Status: None   Collection Time: 01/25/19  1:07 PM   Specimen: Nasopharyngeal Swab  Result Value Ref Range Status   SARS Coronavirus 2 NEGATIVE NEGATIVE Final    Comment: (NOTE) If result is NEGATIVE SARS-CoV-2 target nucleic acids are NOT DETECTED. The SARS-CoV-2 RNA is generally detectable in upper and lower  respiratory specimens during the acute phase of infection. The lowest  concentration of SARS-CoV-2 viral copies this assay can detect is 250  copies / mL. A negative result does not preclude SARS-CoV-2 infection  and should not be used as the sole basis for treatment or other  patient management decisions.  A negative result may occur with  improper specimen collection /  handling, submission of specimen other  than nasopharyngeal swab, presence of viral mutation(s) within the  areas targeted by this assay, and inadequate number of viral copies  (<250 copies / mL). A negative result must be combined with clinical  observations, patient history, and epidemiological information. If result is POSITIVE SARS-CoV-2 target nucleic acids are DETECTED. The SARS-CoV-2 RNA is generally detectable in upper and lower  respiratory specimens dur ing the acute phase of infection.  Positive  results are indicative of active infection with SARS-CoV-2.  Clinical  correlation with patient history and other diagnostic information is  necessary to determine patient infection status.  Positive results do  not rule out bacterial infection or co-infection with other viruses. If result is PRESUMPTIVE POSTIVE  SARS-CoV-2 nucleic acids MAY BE PRESENT.   A presumptive positive result was obtained on the submitted specimen  and confirmed on repeat testing.  While 2019 novel coronavirus  (SARS-CoV-2) nucleic acids may be present in the submitted sample  additional confirmatory testing may be necessary for epidemiological  and / or clinical management purposes  to differentiate between  SARS-CoV-2 and other Sarbecovirus currently known to infect humans.  If clinically indicated additional testing with an alternate test  methodology [phone]) is advised. The SARS-CoV-2 RNA is generally  detectable in upper and lower respiratory sp ecimens during the acute  phase of infection. The expected result is Negative. Fact Sheet for Patients:  StrictlyIdeas.no Fact Sheet for Healthcare Providers: BankingDealers.co.za This test is not yet approved or cleared by the [place] FDA and has been authorized for detection and/or diagnosis of SARS-CoV-2 by FDA under an Emergency Use Authorization (EUA).  This EUA will remain in effect (meaning this  test can be used) for the duration of the COVID-19 declaration under Section 564(b)(1) of the Act, 21 U.S.C. section 360bbb-3(b)(1), unless the authorization is terminated or revoked sooner. Performed at [facility], [place] [address] [place], [place] [address]   MRSA PCR Screening     Status: None   Collection Time: 01/27/19 10:24 AM   Specimen: Nasopharyngeal  Result Value Ref Range Status   MRSA by PCR NEGATIVE NEGATIVE Final    Comment:        The GeneXpert MRSA Assay (FDA approved for NASAL specimens only), is one component of a comprehensive MRSA colonization surveillance program. It is not intended to diagnose MRSA infection nor to guide or monitor treatment for MRSA infections. Performed at [facility], [place] [address], [place], [place] [address]      Labs: Basic Metabolic Panel: Recent Labs  Lab 01/25/19 1246 01/26/19 2024 01/27/19 0507 01/29/19 0401 01/30/19 0350  NA 140 140 140 139 137  K 3.6 4.7 4.9 5.0 4.4  CL 104 103 100 91* 90*  CO2 29 24 30  40* 40*  GLUCOSE 122* 175* 144* 167* 139*  BUN 8 12 11 15 20   CREATININE 0.83 0.70 0.59 0.68 0.66  CALCIUM 9.7 10.2 10.4* 10.4* 10.2   Liver Function Tests: Recent Labs  Lab 01/25/19 1246  AST 29  ALT 22  ALKPHOS 93  BILITOT 0.6  PROT 8.1  ALBUMIN 4.2   No results for input(s): LIPASE, AMYLASE in the last 168 hours. No results for input(s): AMMONIA in the last 168 hours. CBC: Recent Labs  Lab 01/25/19 1246 01/26/19 2024 01/27/19 0507 01/29/19 0401  WBC 7.6 12.3* 12.3* 12.8*  NEUTROABS 5.3 10.8*  --  10.8*  HGB 14.2 13.8 14.2 13.2  HCT 47.2* 47.2* 49.8* 45.8  MCV 95.0 97.7 100.2* 98.9  PLT 232 230 214 222   Cardiac Enzymes: No results for input(s): CKTOTAL, CKMB, CKMBINDEX, TROPONINI in the last 168 hours. BNP: BNP (last 3 results) Recent Labs    02/16/18 0938 01/25/19 1246  BNP 20.0 28.2    ProBNP (last 3 results) No results for input(s): PROBNP  in the last 8760 hours.  CBG: No results for input(s): GLUCAP in the last 168 hours.     Signed:  Nita Sells MD   [facility] 01/30/2019, 10:48 AM

## 2019-01-30 NOTE — Progress Notes (Signed)
Patient has discharged to home on 01/30/2019. Discharge instruction including medication and appointment was given to patient. Patient has no question at this time.

## 2019-06-09 LAB — BLOOD GAS, VENOUS
Acid-Base Excess: 3.3 mmol/L — ABNORMAL HIGH (ref 0.0–2.0)
Bicarbonate: 33.4 mmol/L — ABNORMAL HIGH (ref 20.0–28.0)
O2 Saturation: 87.3 %
Patient temperature: 98.6
pCO2, Ven: 82.3 mmHg (ref 44.0–60.0)
pH, Ven: 7.233 — ABNORMAL LOW (ref 7.250–7.430)
pO2, Ven: 62 mmHg — ABNORMAL HIGH (ref 32.0–45.0)

## 2019-08-27 ENCOUNTER — Encounter (HOSPITAL_COMMUNITY): Payer: Self-pay | Admitting: Emergency Medicine

## 2019-08-27 ENCOUNTER — Inpatient Hospital Stay (HOSPITAL_COMMUNITY)
Admission: EM | Admit: 2019-08-27 | Discharge: 2019-08-30 | DRG: 190 | Disposition: A | Discharge status: Home / Self Care | Payer: Medicare Other | Attending: Internal Medicine | Admitting: Internal Medicine

## 2019-08-27 ENCOUNTER — Emergency Department (HOSPITAL_COMMUNITY): Payer: Medicare Other

## 2019-08-27 DIAGNOSIS — Z9981 Dependence on supplemental oxygen: Secondary | ICD-10-CM

## 2019-08-27 DIAGNOSIS — R0602 Shortness of breath: Secondary | ICD-10-CM

## 2019-08-27 DIAGNOSIS — Z7951 Long term (current) use of inhaled steroids: Secondary | ICD-10-CM

## 2019-08-27 DIAGNOSIS — Z91048 Other nonmedicinal substance allergy status: Secondary | ICD-10-CM

## 2019-08-27 DIAGNOSIS — Z8701 Personal history of pneumonia (recurrent): Secondary | ICD-10-CM

## 2019-08-27 DIAGNOSIS — J45909 Unspecified asthma, uncomplicated: Secondary | ICD-10-CM | POA: Diagnosis present

## 2019-08-27 DIAGNOSIS — M1711 Unilateral primary osteoarthritis, right knee: Secondary | ICD-10-CM | POA: Diagnosis present

## 2019-08-27 DIAGNOSIS — Z91010 Allergy to peanuts: Secondary | ICD-10-CM

## 2019-08-27 DIAGNOSIS — E1165 Type 2 diabetes mellitus with hyperglycemia: Secondary | ICD-10-CM | POA: Diagnosis present

## 2019-08-27 DIAGNOSIS — Z833 Family history of diabetes mellitus: Secondary | ICD-10-CM

## 2019-08-27 DIAGNOSIS — J9621 Acute and chronic respiratory failure with hypoxia: Secondary | ICD-10-CM | POA: Diagnosis present

## 2019-08-27 DIAGNOSIS — Z79899 Other long term (current) drug therapy: Secondary | ICD-10-CM

## 2019-08-27 DIAGNOSIS — Z96642 Presence of left artificial hip joint: Secondary | ICD-10-CM | POA: Diagnosis present

## 2019-08-27 DIAGNOSIS — I5033 Acute on chronic diastolic (congestive) heart failure: Secondary | ICD-10-CM | POA: Diagnosis present

## 2019-08-27 DIAGNOSIS — Z20822 Contact with and (suspected) exposure to covid-19: Secondary | ICD-10-CM | POA: Diagnosis present

## 2019-08-27 DIAGNOSIS — K5900 Constipation, unspecified: Secondary | ICD-10-CM | POA: Diagnosis present

## 2019-08-27 DIAGNOSIS — J441 Chronic obstructive pulmonary disease with (acute) exacerbation: Principal | ICD-10-CM | POA: Diagnosis present

## 2019-08-27 DIAGNOSIS — T380X5A Adverse effect of glucocorticoids and synthetic analogues, initial encounter: Secondary | ICD-10-CM | POA: Diagnosis not present

## 2019-08-27 DIAGNOSIS — D72829 Elevated white blood cell count, unspecified: Secondary | ICD-10-CM | POA: Diagnosis not present

## 2019-08-27 DIAGNOSIS — I5032 Chronic diastolic (congestive) heart failure: Secondary | ICD-10-CM | POA: Diagnosis present

## 2019-08-27 DIAGNOSIS — Z87892 Personal history of anaphylaxis: Secondary | ICD-10-CM

## 2019-08-27 DIAGNOSIS — Z7952 Long term (current) use of systemic steroids: Secondary | ICD-10-CM

## 2019-08-27 DIAGNOSIS — R000 Tachycardia, unspecified: Secondary | ICD-10-CM | POA: Diagnosis present

## 2019-08-27 DIAGNOSIS — Z91041 Radiographic dye allergy status: Secondary | ICD-10-CM

## 2019-08-27 DIAGNOSIS — D649 Anemia, unspecified: Secondary | ICD-10-CM | POA: Diagnosis present

## 2019-08-27 DIAGNOSIS — Z8249 Family history of ischemic heart disease and other diseases of the circulatory system: Secondary | ICD-10-CM

## 2019-08-27 DIAGNOSIS — R079 Chest pain, unspecified: Secondary | ICD-10-CM

## 2019-08-27 DIAGNOSIS — Z6841 Body Mass Index (BMI) 40.0 and over, adult: Secondary | ICD-10-CM

## 2019-08-27 DIAGNOSIS — Z825 Family history of asthma and other chronic lower respiratory diseases: Secondary | ICD-10-CM

## 2019-08-27 DIAGNOSIS — E8809 Other disorders of plasma-protein metabolism, not elsewhere classified: Secondary | ICD-10-CM | POA: Diagnosis present

## 2019-08-27 DIAGNOSIS — Z91013 Allergy to seafood: Secondary | ICD-10-CM

## 2019-08-27 DIAGNOSIS — Z91012 Allergy to eggs: Secondary | ICD-10-CM

## 2019-08-27 DIAGNOSIS — E785 Hyperlipidemia, unspecified: Secondary | ICD-10-CM | POA: Diagnosis present

## 2019-08-27 DIAGNOSIS — Z8674 Personal history of sudden cardiac arrest: Secondary | ICD-10-CM

## 2019-08-27 DIAGNOSIS — K219 Gastro-esophageal reflux disease without esophagitis: Secondary | ICD-10-CM | POA: Diagnosis present

## 2019-08-27 DIAGNOSIS — Z91040 Latex allergy status: Secondary | ICD-10-CM

## 2019-08-27 DIAGNOSIS — Y92239 Unspecified place in hospital as the place of occurrence of the external cause: Secondary | ICD-10-CM | POA: Diagnosis not present

## 2019-08-27 LAB — CBC WITH DIFFERENTIAL/PLATELET
Abs Immature Granulocytes: 0.05 10*3/uL (ref 0.00–0.07)
Basophils Absolute: 0.1 10*3/uL (ref 0.0–0.1)
Basophils Relative: 1 %
Eosinophils Absolute: 0.4 10*3/uL (ref 0.0–0.5)
Eosinophils Relative: 4 %
HCT: 42.6 % (ref 36.0–46.0)
Hemoglobin: 12.6 g/dL (ref 12.0–15.0)
Immature Granulocytes: 1 %
Lymphocytes Relative: 24 %
Lymphs Abs: 2.3 10*3/uL (ref 0.7–4.0)
MCH: 28.8 pg (ref 26.0–34.0)
MCHC: 29.6 g/dL — ABNORMAL LOW (ref 30.0–36.0)
MCV: 97.3 fL (ref 80.0–100.0)
Monocytes Absolute: 0.7 10*3/uL (ref 0.1–1.0)
Monocytes Relative: 7 %
Neutro Abs: 6.2 10*3/uL (ref 1.7–7.7)
Neutrophils Relative %: 63 %
Platelets: 250 10*3/uL (ref 150–400)
RBC: 4.38 MIL/uL (ref 3.87–5.11)
RDW: 15 % (ref 11.5–15.5)
WBC: 9.7 10*3/uL (ref 4.0–10.5)
nRBC: 0 % (ref 0.0–0.2)

## 2019-08-27 LAB — RESPIRATORY PANEL BY RT PCR (FLU A&B, COVID)
Influenza A by PCR: NEGATIVE
Influenza B by PCR: NEGATIVE
SARS Coronavirus 2 by RT PCR: NEGATIVE

## 2019-08-27 LAB — BASIC METABOLIC PANEL
Anion gap: 9 (ref 5–15)
BUN: 6 mg/dL — ABNORMAL LOW (ref 8–23)
CO2: 32 mmol/L (ref 22–32)
Calcium: 9.4 mg/dL (ref 8.9–10.3)
Chloride: 103 mmol/L (ref 98–111)
Creatinine, Ser: 0.71 mg/dL (ref 0.44–1.00)
GFR calc Af Amer: >60 mL/min (ref >60)
GFR calc non Af Amer: >60 mL/min (ref >60)
Glucose, Bld: 100 mg/dL — ABNORMAL HIGH (ref 70–99)
Potassium: 4.1 mmol/L (ref 3.5–5.1)
Sodium: 144 mmol/L (ref 135–145)

## 2019-08-27 LAB — BRAIN NATRIURETIC PEPTIDE: B Natriuretic Peptide: 47.8 pg/mL (ref 0.0–100.0)

## 2019-08-27 LAB — TROPONIN I (HIGH SENSITIVITY)
Troponin I (High Sensitivity): 3 ng/L (ref <18)
Troponin I (High Sensitivity): 4 ng/L (ref <18)

## 2019-08-27 MED ORDER — IPRATROPIUM-ALBUTEROL 0.5-2.5 (3) MG/3ML IN SOLN
3.0000 mL | Freq: Once | RESPIRATORY_TRACT | Status: AC
  Administered 2019-08-27: 3 mL via RESPIRATORY_TRACT
  Filled 2019-08-27: qty 3

## 2019-08-27 MED ORDER — ALBUTEROL SULFATE HFA 108 (90 BASE) MCG/ACT IN AERS
4.0000 | INHALATION_SPRAY | Freq: Once | RESPIRATORY_TRACT | Status: AC
  Administered 2019-08-27: 4 via RESPIRATORY_TRACT
  Filled 2019-08-27: qty 6.7

## 2019-08-27 MED ORDER — METHYLPREDNISOLONE SODIUM SUCC 125 MG IJ SOLR
80.0000 mg | Freq: Once | INTRAMUSCULAR | Status: AC
  Administered 2019-08-27: 80 mg via INTRAVENOUS
  Filled 2019-08-27: qty 2

## 2019-08-27 MED ORDER — SODIUM CHLORIDE 0.9 % IV SOLN
1.0000 g | Freq: Once | INTRAVENOUS | Status: AC
  Administered 2019-08-27: 1 g via INTRAVENOUS
  Filled 2019-08-27: qty 10

## 2019-08-27 MED ORDER — IPRATROPIUM-ALBUTEROL 0.5-2.5 (3) MG/3ML IN SOLN
3.0000 mL | Freq: Once | RESPIRATORY_TRACT | Status: AC
  Administered 2019-08-28: 3 mL via RESPIRATORY_TRACT
  Filled 2019-08-27: qty 3

## 2019-08-27 MED ORDER — SODIUM CHLORIDE 0.9 % IV SOLN
500.0000 mg | Freq: Once | INTRAVENOUS | Status: AC
  Administered 2019-08-27: 500 mg via INTRAVENOUS
  Filled 2019-08-27: qty 500

## 2019-08-27 NOTE — ED Provider Notes (Signed)
[facility]-EMERGENCY DEPT Provider Note   CSN: [id number] Arrival date & time: 08/27/19  1918     History Chief Complaint  Patient presents with  . Shortness of Breath    Sonia Drake is a 64 y.o. female.  HPI   64 year old acute on chronic.  History of underlying COPD.  Chronically on 2 L of oxygen via nasal cannula at baseline.  Last night her breathing began getting worse.  More short of breath.  Occasional coughing.  Sometimes some pain in her right lower chest when she coughs.  No fevers.  No unusual swelling.  She reports compliance with her medications.  No sick contacts that she is aware of.  Past Medical History:  Diagnosis Date  . Arthritis    "right knee; left hip" (04/07/2017)  . Asthma   . CHF (congestive heart failure) (HCC)    reported treatment for fluid overload in the setting of asthma exacerbation ~ 1998  . COPD (chronic obstructive pulmonary disease) (HCC)    patient states she does not have COPD but was treated for it.   . 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 Dyspnea   . Dysrhythmia    occasional skipped beats  . GERD (gastroesophageal reflux disease)   . On home oxygen therapy    "I sleep with 2L" (04/07/2017)  . Pneumonia    "several times" (04/07/2017)  . Seizures (HCC) 2003 X 1   unknown cause    Patient Active Problem List   Diagnosis Date Noted  . Acute and chronic respiratory failure with hypercapnia (HCC) 01/27/2019  . Asthma with COPD with exacerbation (HCC) 01/26/2019  . GERD (gastroesophageal reflux disease) 02/16/2018  . Seizures (HCC) 02/16/2018  . Bronchitis 11/11/2017  . Acute on chronic respiratory failure with hypoxia (HCC) 11/03/2017  . Respiratory distress   . Acute respiratory failure with hypoxia (HCC) 11/02/2017  . Chronic diastolic CHF (congestive heart failure) (HCC) 09/19/2017  . Primary osteoarthritis of left hip 04/07/2017  . Osteoarthritis of left hip 04/02/2017  . Lung  nodule 02/04/2016  . Asthma exacerbation with COPD (chronic obstructive pulmonary disease) (HCC) 07/21/2015  . Influenza B 04/23/2015  . Asthma exacerbation 04/22/2015  . Hypokalemia 04/22/2015  . 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 Influenza with pneumonia 07/03/2014  . Multiple lung nodules on CT 07/03/2014  . Asthma 07/02/2014  . Hyperglycemia 07/02/2014  . Chronic obstructive pulmonary disease with acute exacerbation [facility])     Past Surgical History:  Procedure Laterality Date  . JOINT REPLACEMENT    . KNEE ARTHROSCOPY Right 2017  . 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     Family History  Problem Relation Age of Onset  . Heart disease Mother   . Heart disease Father   . Diabetes Father   . Asthma Father   . Diabetes Brother   . Diabetes Sister   . Asthma Brother   . Asthma Sister   . Hypertension Sister     Social History   Tobacco Use  . Smoking status: Never Smoker  . Smokeless tobacco: Never Used  Substance Use Topics  . Alcohol use: No  . Drug use: No    Home Medications Prior to Admission medications  Medication Sig Start Date End Date Taking? Authorizing Provider  albuterol (PROVENTIL HFA;VENTOLIN HFA) 108 (90 Base) MCG/ACT inhaler Inhale 2 puffs into the lungs every 6 (six) hours as needed for wheezing or shortness of breath. 04/14/18   Shon Hale, MD  albuterol (PROVENTIL) (2.5 MG/3ML) 0.083% nebulizer solution Take 3 mLs (2.5 mg total) by nebulization every 6 (six) hours as needed for wheezing or shortness of breath. 01/30/19   Rhetta Mura, MD  azithromycin (ZITHROMAX) 250 MG tablet Take 1 tablet (250 mg total) by mouth daily. Finish all these tabs 01/30/19   Rhetta Mura, MD   budesonide-formoterol [facility]) 160-4.5 MCG/ACT inhaler Inhale 2 puffs into the lungs 2 (two) times daily. 04/14/18   Shon Hale, MD  calcium-vitamin D (OSCAL WITH D) 500-200 MG-UNIT tablet Take 1 tablet by mouth daily with breakfast. 04/14/18   Emokpae, Courage, MD  fluticasone (FLONASE) 50 MCG/ACT nasal spray Place 1 spray into both nostrils daily. 01/01/19   [provider]  guaiFENesin (MUCINEX) 600 MG 12 hr tablet Take 1 tablet (600 mg total) by mouth 2 (two) times daily. Patient not taking: Reported on 01/25/2019 04/14/18   Shon Hale, MD  montelukast (SINGULAIR) 10 MG tablet Take 1 tablet (10 mg total) by mouth daily. 04/14/18   Shon Hale, MD  pantoprazole (PROTONIX) 20 MG tablet Take 20 mg by mouth daily as needed for indigestion.    [provider]  predniSONE (DELTASONE) 20 MG tablet Take 3 tablets (60 mg total) by mouth daily before breakfast. 01/31/19   Rhetta Mura, MD  tiotropium (SPIRIVA) 18 MCG inhalation capsule Place 1 capsule (18 mcg total) into inhaler and inhale daily. 04/14/18   Shon Hale, MD  triamcinolone lotion (KENALOG) 0.1 % Apply 1 application topically 2 (two) times daily as needed (for rash). 04/14/18   Shon Hale, MD    Allergies    Contrast media [iodinated diagnostic agents], Peanuts [peanut oil], Shellfish allergy, Soap, Latex, and Eggs or egg-derived products  Review of Systems   Review of Systems All systems reviewed and negative, other than as noted in HPI.  Physical Exam Updated Vital Signs BP 136/74   Pulse (!) 106   Temp 99.3 F (37.4 C) (Oral)   Resp (!) 27   SpO2 95%   Physical Exam Vitals and nursing note reviewed.  Constitutional:      General: She is not in acute distress.    Appearance: She is well-developed.  HENT:     Head: Normocephalic and atraumatic.  Eyes:     General:        Right eye: No discharge.        Left eye: No discharge.     Conjunctiva/sclera: Conjunctivae  normal.  Cardiovascular:     Rate and Rhythm: Normal rate and regular rhythm.     Heart sounds: Normal heart sounds. No murmur. No friction rub. No gallop.   Pulmonary:     Effort: Pulmonary effort is normal. No respiratory distress.     Breath sounds: Wheezing present.  Abdominal:     General: There is no distension.     Palpations: Abdomen is soft.     Tenderness: There is no abdominal tenderness.  Musculoskeletal:        General: No tenderness.     Cervical back: Neck supple.  Skin:    General: Skin is warm and dry.  Neurological:     Mental Status: She is alert.  Psychiatric:        Behavior: Behavior  normal.        Thought Content: Thought content normal.     ED Results / Procedures / Treatments   Labs (all labs ordered are listed, but only abnormal results are displayed) Labs Reviewed  CBC WITH DIFFERENTIAL/PLATELET - Abnormal; Notable for the following components:      Result Value   MCHC 29.6 (*)    All other components within normal limits  BASIC METABOLIC PANEL - Abnormal; Notable for the following components:   Glucose, Bld 100 (*)    BUN 6 (*)    All other components within normal limits  D-DIMER, QUANTITATIVE (NOT AT [facility]) - Abnormal; Notable for the following components:   D-Dimer, Quant 2.52 (*)    All other components within normal limits  CBC WITH DIFFERENTIAL/PLATELET - Abnormal; Notable for the following components:   WBC 15.8 (*)    MCHC 29.4 (*)    Neutro Abs 14.2 (*)    Abs Immature Granulocytes 0.10 (*)    All other components within normal limits  COMPREHENSIVE METABOLIC PANEL - Abnormal; Notable for the following components:   Glucose, Bld 173 (*)    All other components within normal limits  MAGNESIUM - Abnormal; Notable for the following components:   Magnesium 2.6 (*)    All other components within normal limits  CBC WITH DIFFERENTIAL/PLATELET - Abnormal; Notable for the following components:   WBC 17.1 (*)    Hemoglobin 11.7 (*)     MCHC 29.3 (*)    Neutro Abs 15.1 (*)    Abs Immature Granulocytes 0.14 (*)    All other components within normal limits  COMPREHENSIVE METABOLIC PANEL - Abnormal; Notable for the following components:   Glucose, Bld 149 (*)    Albumin 3.4 (*)    All other components within normal limits  HEMOGLOBIN A1C - Abnormal; Notable for the following components:   Hgb A1c MFr Bld 6.4 (*)    All other components within normal limits  CBC WITH DIFFERENTIAL/PLATELET - Abnormal; Notable for the following components:   WBC 18.1 (*)    Neutro Abs 16.1 (*)    Abs Immature Granulocytes 0.17 (*)    All other components within normal limits  COMPREHENSIVE METABOLIC PANEL - Abnormal; Notable for the following components:   Glucose, Bld 178 (*)    Albumin 3.3 (*)    All other components within normal limits  RETICULOCYTES - Abnormal; Notable for the following components:   Immature Retic Fract 26.1 (*)    All other components within normal limits  HEMOGLOBIN A1C - Abnormal; Notable for the following components:   Hgb A1c MFr Bld 6.5 (*)    All other components within normal limits  GLUCOSE, CAPILLARY - Abnormal; Notable for the following components:   Glucose-Capillary 211 (*)    All other components within normal limits  GLUCOSE, CAPILLARY - Abnormal; Notable for the following components:   Glucose-Capillary 149 (*)    All other components within normal limits  RESPIRATORY PANEL BY RT PCR (FLU A&B, COVID)  BRAIN NATRIURETIC PEPTIDE  HIV ANTIBODY (ROUTINE TESTING W REFLEX)  PROCALCITONIN  PHOSPHORUS  PHOSPHORUS  MAGNESIUM  MAGNESIUM  PHOSPHORUS  VITAMIN B12  FOLATE  IRON AND TIBC  FERRITIN  TROPONIN I (HIGH SENSITIVITY)  TROPONIN I (HIGH SENSITIVITY)    EKG EKG Interpretation  Date/Time:  Friday Aug 27 2019 20:12:56 EDT Ventricular Rate:  100 PR Interval:    QRS Duration: 94 QT Interval:  304 QTC Calculation: 392 R Axis:  75 Text Interpretation: Sinus tachycardia Atrial premature  complex RSR' in V1 or V2, right VCD or RVH Borderline T abnormalities, diffuse leads Confirmed by Virgel Manifold [phone]) on 08/27/2019 8:20:06 PM   Radiology DG Chest Portable 1 View  Result Date: 08/27/2019 CLINICAL DATA:  Dyspnea. Shortness of breath. Productive cough. EXAM: PORTABLE CHEST 1 VIEW COMPARISON:  06/03/2019 FINDINGS: There is increased attenuation at the lung bases favored to represent breast tissue attenuation artifact. There is a airspace opacity at the left costophrenic angle which appears to be new since the prior study. There is no pneumothorax. No large pleural effusion. There is no acute osseous abnormality. IMPRESSION: New airspace opacity at the left costophrenic angle suspicious for pneumonia or atelectasis. Electronically Signed   By: Constance Holster M.D.   On: 08/27/2019 20:25    Procedures Procedures (including critical care time)  Medications Ordered in ED Medications  ipratropium-albuterol (DUONEB) 0.5-2.5 (3) MG/3ML nebulizer solution 3 mL (has no administration in time range)  methylPREDNISolone sodium succinate (SOLU-MEDROL) 125 mg/2 mL injection 80 mg (80 mg Intravenous Given 08/27/19 2014)  albuterol (VENTOLIN HFA) 108 (90 Base) MCG/ACT inhaler 4 puff (4 puffs Inhalation Given 08/27/19 2013)  cefTRIAXone (ROCEPHIN) 1 g in sodium chloride 0.9 % 100 mL IVPB (0 g Intravenous Stopped 08/27/19 2154)  azithromycin (ZITHROMAX) 500 mg in sodium chloride 0.9 % 250 mL IVPB (500 mg Intravenous New Bag/Given 08/27/19 2154)  ipratropium-albuterol (DUONEB) 0.5-2.5 (3) MG/3ML nebulizer solution 3 mL (3 mLs Nebulization Given 08/27/19 2227)    ED Course  I have reviewed the triage vital signs and the nursing notes.  Pertinent labs & imaging results that were available during my care of the patient were reviewed by me and considered in my medical decision making (see chart for details).    MDM Rules/Calculators/A&P                      64 year old female with dyspnea.  Suspect  COPD exacerbation.  Wheezing on exam.  Does not appear to be overtly volume overloaded.  Increasing work of breathing.  Will need for ongoing treatment. Final Clinical Impression(s) / ED Diagnoses Final diagnoses:  SOB (shortness of breath)    Rx / DC Orders ED Discharge Orders    None       Virgel Manifold, MD 08/30/19 1145

## 2019-08-27 NOTE — ED Triage Notes (Signed)
Patient BIB daughter, reports worsening SOB with productive cough since last night. Hx CHF, asthma.

## 2019-08-28 ENCOUNTER — Inpatient Hospital Stay (HOSPITAL_COMMUNITY): Payer: Medicare Other

## 2019-08-28 ENCOUNTER — Other Ambulatory Visit: Payer: Self-pay

## 2019-08-28 ENCOUNTER — Encounter (HOSPITAL_COMMUNITY): Payer: Self-pay | Admitting: Internal Medicine

## 2019-08-28 DIAGNOSIS — Z91048 Other nonmedicinal substance allergy status: Secondary | ICD-10-CM | POA: Diagnosis not present

## 2019-08-28 DIAGNOSIS — I5032 Chronic diastolic (congestive) heart failure: Secondary | ICD-10-CM

## 2019-08-28 DIAGNOSIS — R0602 Shortness of breath: Secondary | ICD-10-CM | POA: Diagnosis not present

## 2019-08-28 DIAGNOSIS — K219 Gastro-esophageal reflux disease without esophagitis: Secondary | ICD-10-CM | POA: Diagnosis present

## 2019-08-28 DIAGNOSIS — K5900 Constipation, unspecified: Secondary | ICD-10-CM | POA: Diagnosis present

## 2019-08-28 DIAGNOSIS — M1711 Unilateral primary osteoarthritis, right knee: Secondary | ICD-10-CM | POA: Diagnosis present

## 2019-08-28 DIAGNOSIS — Z8249 Family history of ischemic heart disease and other diseases of the circulatory system: Secondary | ICD-10-CM | POA: Diagnosis not present

## 2019-08-28 DIAGNOSIS — R079 Chest pain, unspecified: Secondary | ICD-10-CM

## 2019-08-28 DIAGNOSIS — Z91012 Allergy to eggs: Secondary | ICD-10-CM | POA: Diagnosis not present

## 2019-08-28 DIAGNOSIS — Z91041 Radiographic dye allergy status: Secondary | ICD-10-CM | POA: Diagnosis not present

## 2019-08-28 DIAGNOSIS — D649 Anemia, unspecified: Secondary | ICD-10-CM | POA: Diagnosis present

## 2019-08-28 DIAGNOSIS — Z96642 Presence of left artificial hip joint: Secondary | ICD-10-CM | POA: Diagnosis present

## 2019-08-28 DIAGNOSIS — Y92239 Unspecified place in hospital as the place of occurrence of the external cause: Secondary | ICD-10-CM | POA: Diagnosis not present

## 2019-08-28 DIAGNOSIS — Z91013 Allergy to seafood: Secondary | ICD-10-CM | POA: Diagnosis not present

## 2019-08-28 DIAGNOSIS — J9621 Acute and chronic respiratory failure with hypoxia: Secondary | ICD-10-CM | POA: Diagnosis present

## 2019-08-28 DIAGNOSIS — J45909 Unspecified asthma, uncomplicated: Secondary | ICD-10-CM | POA: Diagnosis not present

## 2019-08-28 DIAGNOSIS — Z9981 Dependence on supplemental oxygen: Secondary | ICD-10-CM | POA: Diagnosis not present

## 2019-08-28 DIAGNOSIS — J441 Chronic obstructive pulmonary disease with (acute) exacerbation: Secondary | ICD-10-CM | POA: Diagnosis present

## 2019-08-28 DIAGNOSIS — Z6841 Body Mass Index (BMI) 40.0 and over, adult: Secondary | ICD-10-CM | POA: Diagnosis not present

## 2019-08-28 DIAGNOSIS — E785 Hyperlipidemia, unspecified: Secondary | ICD-10-CM | POA: Diagnosis present

## 2019-08-28 DIAGNOSIS — Z8701 Personal history of pneumonia (recurrent): Secondary | ICD-10-CM | POA: Diagnosis not present

## 2019-08-28 DIAGNOSIS — Z91010 Allergy to peanuts: Secondary | ICD-10-CM | POA: Diagnosis not present

## 2019-08-28 DIAGNOSIS — Z20822 Contact with and (suspected) exposure to covid-19: Secondary | ICD-10-CM | POA: Diagnosis present

## 2019-08-28 DIAGNOSIS — Z8674 Personal history of sudden cardiac arrest: Secondary | ICD-10-CM | POA: Diagnosis not present

## 2019-08-28 DIAGNOSIS — Z91040 Latex allergy status: Secondary | ICD-10-CM | POA: Diagnosis not present

## 2019-08-28 DIAGNOSIS — E1165 Type 2 diabetes mellitus with hyperglycemia: Secondary | ICD-10-CM | POA: Diagnosis present

## 2019-08-28 DIAGNOSIS — Z87892 Personal history of anaphylaxis: Secondary | ICD-10-CM | POA: Diagnosis not present

## 2019-08-28 DIAGNOSIS — I5033 Acute on chronic diastolic (congestive) heart failure: Secondary | ICD-10-CM | POA: Diagnosis present

## 2019-08-28 LAB — COMPREHENSIVE METABOLIC PANEL
ALT: 23 U/L (ref 0–44)
AST: 20 U/L (ref 15–41)
Albumin: 4 g/dL (ref 3.5–5.0)
Alkaline Phosphatase: 104 U/L (ref 38–126)
Anion gap: 9 (ref 5–15)
BUN: 8 mg/dL (ref 8–23)
CO2: 31 mmol/L (ref 22–32)
Calcium: 10 mg/dL (ref 8.9–10.3)
Chloride: 103 mmol/L (ref 98–111)
Creatinine, Ser: 0.6 mg/dL (ref 0.44–1.00)
GFR calc Af Amer: >60 mL/min (ref >60)
GFR calc non Af Amer: >60 mL/min (ref >60)
Glucose, Bld: 173 mg/dL — ABNORMAL HIGH (ref 70–99)
Potassium: 5 mmol/L (ref 3.5–5.1)
Sodium: 143 mmol/L (ref 135–145)
Total Bilirubin: 0.3 mg/dL (ref 0.3–1.2)
Total Protein: 7.8 g/dL (ref 6.5–8.1)

## 2019-08-28 LAB — CBC WITH DIFFERENTIAL/PLATELET
Abs Immature Granulocytes: 0.1 10*3/uL — ABNORMAL HIGH (ref 0.00–0.07)
Basophils Absolute: 0 10*3/uL (ref 0.0–0.1)
Basophils Relative: 0 %
Eosinophils Absolute: 0 10*3/uL (ref 0.0–0.5)
Eosinophils Relative: 0 %
HCT: 41.8 % (ref 36.0–46.0)
Hemoglobin: 12.3 g/dL (ref 12.0–15.0)
Immature Granulocytes: 1 %
Lymphocytes Relative: 7 %
Lymphs Abs: 1.2 10*3/uL (ref 0.7–4.0)
MCH: 28.9 pg (ref 26.0–34.0)
MCHC: 29.4 g/dL — ABNORMAL LOW (ref 30.0–36.0)
MCV: 98.1 fL (ref 80.0–100.0)
Monocytes Absolute: 0.3 10*3/uL (ref 0.1–1.0)
Monocytes Relative: 2 %
Neutro Abs: 14.2 10*3/uL — ABNORMAL HIGH (ref 1.7–7.7)
Neutrophils Relative %: 90 %
Platelets: 227 10*3/uL (ref 150–400)
RBC: 4.26 MIL/uL (ref 3.87–5.11)
RDW: 14.9 % (ref 11.5–15.5)
WBC: 15.8 10*3/uL — ABNORMAL HIGH (ref 4.0–10.5)
nRBC: 0 % (ref 0.0–0.2)

## 2019-08-28 LAB — D-DIMER, QUANTITATIVE: D-Dimer, Quant: 2.52 ug/mL-FEU — ABNORMAL HIGH (ref 0.00–0.50)

## 2019-08-28 LAB — PHOSPHORUS: Phosphorus: 3.5 mg/dL (ref 2.5–4.6)

## 2019-08-28 LAB — MAGNESIUM: Magnesium: 2.6 mg/dL — ABNORMAL HIGH (ref 1.7–2.4)

## 2019-08-28 LAB — PROCALCITONIN: Procalcitonin: <0.1 ng/mL

## 2019-08-28 LAB — HIV ANTIBODY (ROUTINE TESTING W REFLEX): HIV Screen 4th Generation wRfx: NONREACTIVE

## 2019-08-28 MED ORDER — ALBUTEROL SULFATE (2.5 MG/3ML) 0.083% IN NEBU
2.5000 mg | INHALATION_SOLUTION | RESPIRATORY_TRACT | Status: DC | PRN
  Administered 2019-08-29: 2.5 mg via RESPIRATORY_TRACT
  Filled 2019-08-28: qty 3

## 2019-08-28 MED ORDER — METHYLPREDNISOLONE SODIUM SUCC 125 MG IJ SOLR
60.0000 mg | Freq: Four times a day (QID) | INTRAMUSCULAR | Status: DC
  Administered 2019-08-28 – 2019-08-29 (×6): 60 mg via INTRAVENOUS
  Filled 2019-08-28 (×6): qty 2

## 2019-08-28 MED ORDER — CALCIUM CARBONATE-VITAMIN D 500-200 MG-UNIT PO TABS
1.0000 | ORAL_TABLET | Freq: Every day | ORAL | Status: DC
  Administered 2019-08-28 – 2019-08-30 (×3): 1 via ORAL
  Filled 2019-08-28 (×3): qty 1

## 2019-08-28 MED ORDER — MONTELUKAST SODIUM 10 MG PO TABS
10.0000 mg | ORAL_TABLET | Freq: Every day | ORAL | Status: DC
  Administered 2019-08-28 – 2019-08-30 (×3): 10 mg via ORAL
  Filled 2019-08-28 (×3): qty 1

## 2019-08-28 MED ORDER — MAGNESIUM SULFATE 2 GM/50ML IV SOLN
2.0000 g | Freq: Once | INTRAVENOUS | Status: AC
  Administered 2019-08-28: 2 g via INTRAVENOUS
  Filled 2019-08-28: qty 50

## 2019-08-28 MED ORDER — ATORVASTATIN CALCIUM 10 MG PO TABS
10.0000 mg | ORAL_TABLET | Freq: Every day | ORAL | Status: DC
  Administered 2019-08-28 – 2019-08-30 (×3): 10 mg via ORAL
  Filled 2019-08-28 (×3): qty 1

## 2019-08-28 MED ORDER — PANTOPRAZOLE SODIUM 20 MG PO TBEC
20.0000 mg | DELAYED_RELEASE_TABLET | Freq: Every day | ORAL | Status: DC | PRN
  Filled 2019-08-28: qty 1

## 2019-08-28 MED ORDER — ACETAMINOPHEN 650 MG RE SUPP: 650.0000 mg | Freq: Four times a day (QID) | RECTAL | Status: DC | PRN

## 2019-08-28 MED ORDER — IPRATROPIUM-ALBUTEROL 0.5-2.5 (3) MG/3ML IN SOLN
3.0000 mL | Freq: Four times a day (QID) | RESPIRATORY_TRACT | Status: DC
  Administered 2019-08-28 – 2019-08-30 (×11): 3 mL via RESPIRATORY_TRACT
  Filled 2019-08-28 (×11): qty 3

## 2019-08-28 MED ORDER — ENOXAPARIN SODIUM 40 MG/0.4ML ~~LOC~~ SOLN
40.0000 mg | SUBCUTANEOUS | Status: DC
  Administered 2019-08-28: 40 mg via SUBCUTANEOUS
  Filled 2019-08-28 (×3): qty 0.4

## 2019-08-28 MED ORDER — TECHNETIUM TO 99M ALBUMIN AGGREGATED
1.5000 | Freq: Once | INTRAVENOUS | Status: AC
  Administered 2019-08-28: 1.5 via INTRAVENOUS

## 2019-08-28 MED ORDER — DM-GUAIFENESIN ER 30-600 MG PO TB12
1.0000 | ORAL_TABLET | Freq: Two times a day (BID) | ORAL | Status: DC
  Administered 2019-08-28 – 2019-08-30 (×5): 1 via ORAL
  Filled 2019-08-28 (×5): qty 1

## 2019-08-28 MED ORDER — SODIUM CHLORIDE 0.9 % IV SOLN
100.0000 mg | Freq: Two times a day (BID) | INTRAVENOUS | Status: DC
  Administered 2019-08-28 – 2019-08-29 (×3): 100 mg via INTRAVENOUS
  Filled 2019-08-28 (×3): qty 100

## 2019-08-28 MED ORDER — ACETAMINOPHEN 325 MG PO TABS: 650.0000 mg | ORAL_TABLET | Freq: Four times a day (QID) | ORAL | Status: DC | PRN

## 2019-08-28 NOTE — Plan of Care (Signed)

## 2019-08-28 NOTE — Progress Notes (Signed)
Bilateral lower extremity venous duplex complete.  Please see CV proc tab for preliminary results. Levin Bacon- RDMS, RVT 3:13 PM  08/28/2019

## 2019-08-28 NOTE — H&P (Signed)
History and Physical    Sonia Drake Sonia Drake DOB: 01-25-1956 DOA: 08/27/2019  PCP: System, Pcp Not In Patient coming from: Home  Chief Complaint: Shortness of breath  HPI: Sonia Drake is a 64 y.o. female with medical history significant of asthma with COPD, chronic respiratory failure on 2 L home oxygen, chronic diastolic CHF, GERD, prior seizure disorder not on antiepileptics presenting with complaints of shortness of breath.  Patient reports 1 week history of shortness of breath, wheezing, and productive cough.  Denies fevers.  Reports sharp right lower chest pain for the past few days whenever she takes deep breaths.  Reports compliance with her home inhalers including albuterol, Symbicort, Spiriva, and Flonase.  States she had an outpatient Covid test done a few days ago which was negative.  ED Course: Afebrile.  Slightly tachycardic and tachypneic.  Satting well on 2 to 3 L supplemental oxygen.  Labs showing no leukocytosis.  BNP normal.  High-sensitivity troponin negative.  EKG without acute ischemic changes.  SARS-CoV-2 PCR test negative. Chest x-ray showing a new airspace opacity at the left costophrenic angle suspicious for pneumonia or atelectasis.  Patient was given albuterol inhaler, DuoNeb treatments, Solu-Medrol 80 mg, azithromycin, and ceftriaxone.  Review of Systems:  All systems reviewed and apart from history of presenting illness, are negative.  Past Medical History:  Diagnosis Date  . Arthritis    "right knee; left hip" (04/07/2017)  . Asthma   . CHF (congestive heart failure) (HCC)    reported treatment for fluid overload in the setting of asthma exacerbation ~ 1998  . COPD (chronic obstructive pulmonary disease) (HCC)    patient states she does not have COPD but was treated for it.   . 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 Dyspnea   . Dysrhythmia    occasional skipped beats  . GERD (gastroesophageal reflux disease)   . On home oxygen  therapy    "I sleep with 2L" (04/07/2017)  . Pneumonia    "several times" (04/07/2017)  . Seizures (HCC) 2003 X 1   unknown cause    Past Surgical History:  Procedure Laterality Date  . JOINT REPLACEMENT    . KNEE ARTHROSCOPY Right 2017  . 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 She has never used smokeless tobacco. She reports that she does not drink alcohol or use drugs.  Allergies  Allergen Reactions  . Contrast Media [Iodinated Diagnostic Agents] Anaphylaxis and Other (See Comments)    ** CARDIAC ARREST **  . Peanuts [Peanut Oil] Shortness Of Breath and Swelling  . Shellfish Allergy Shortness Of Breath and Swelling  . Soap Shortness Of Breath and Swelling    LAUNDRY DETERGENT  . Latex Hives  . Eggs Or Egg-Derived Products Rash    Family History  Problem Relation Age of Onset  . Heart disease Mother   . Heart disease Father   . Diabetes Father   . Asthma Father   . Diabetes Brother   . Diabetes Sister   . Asthma Brother   . Asthma Sister   . Hypertension Sister     Prior to Admission medications   Medication Sig Start Date End Date Taking? Authorizing Provider  albuterol (PROVENTIL HFA;VENTOLIN HFA) 108 (90 Base) MCG/ACT inhaler  Inhale 2 puffs into the lungs every 6 (six) hours as needed for wheezing or shortness of breath. 04/14/18   Shon Hale, MD  albuterol (PROVENTIL) (2.5 MG/3ML) 0.083% nebulizer solution Take 3 mLs (2.5 mg total) by nebulization every 6 (six) hours as needed for wheezing or shortness of breath. 01/30/19   Rhetta Mura, MD  azithromycin (ZITHROMAX) 250 MG tablet Take 1 tablet (250 mg total) by mouth daily. Finish all these tabs 01/30/19   Rhetta Mura, MD  budesonide-formoterol [facility]) 160-4.5  MCG/ACT inhaler Inhale 2 puffs into the lungs 2 (two) times daily. 04/14/18   Shon Hale, MD  calcium-vitamin D (OSCAL WITH D) 500-200 MG-UNIT tablet Take 1 tablet by mouth daily with breakfast. 04/14/18   Emokpae, Courage, MD  fluticasone (FLONASE) 50 MCG/ACT nasal spray Place 1 spray into both nostrils daily. 01/01/19   [provider]  guaiFENesin (MUCINEX) 600 MG 12 hr tablet Take 1 tablet (600 mg total) by mouth 2 (two) times daily. Patient not taking: Reported on 01/25/2019 04/14/18   Shon Hale, MD  montelukast (SINGULAIR) 10 MG tablet Take 1 tablet (10 mg total) by mouth daily. 04/14/18   Shon Hale, MD  pantoprazole (PROTONIX) 20 MG tablet Take 20 mg by mouth daily as needed for indigestion.    [provider]  predniSONE (DELTASONE) 20 MG tablet Take 3 tablets (60 mg total) by mouth daily before breakfast. 01/31/19   Rhetta Mura, MD  tiotropium (SPIRIVA) 18 MCG inhalation capsule Place 1 capsule (18 mcg total) into inhaler and inhale daily. 04/14/18   Shon Hale, MD  triamcinolone lotion (KENALOG) 0.1 % Apply 1 application topically 2 (two) times daily as needed (for rash). 04/14/18   Shon Hale, MD    Physical Exam: Vitals:   08/27/19 2245 08/28/19 0015 08/28/19 0045 08/28/19 0100  BP: 136/74 123/73 118/63 (!) 112/59  Pulse: (!) 106 95 91 88  Resp: (!) 27 (!) 23 19 20   Temp:      TempSrc:      SpO2: 95% 98% (!) 85% (!) 87%    Physical Exam  Constitutional: She is oriented to person, place, and time. She appears well-developed and well-nourished. No distress.  HENT:  Head: Normocephalic.  Eyes: Right eye exhibits no discharge. Left eye exhibits no discharge.  Cardiovascular: Normal rate, regular rhythm and intact distal pulses.  Pulmonary/Chest: Effort normal. No respiratory distress. She has wheezes. She has no rales.  Slightly tachypneic with respiratory rate in the low 20s On 2 L supplemental oxygen Diffuse end  expiratory wheezing  Abdominal: Soft. Bowel sounds are normal. She exhibits no distension. There is no abdominal tenderness. There is no guarding.  Musculoskeletal:        General: No edema.     Cervical back: Neck supple.  Neurological: She is alert and oriented to person, place, and time.  Skin: Skin is warm and dry. She is not diaphoretic.    Labs on Admission: I have personally reviewed following labs and imaging studies  CBC: Recent Labs  Lab 08/27/19 1933  WBC 9.7  NEUTROABS 6.2  HGB 12.6  HCT 42.6  MCV 97.3  PLT 250   Basic Metabolic Panel: Recent Labs  Lab 08/27/19 1933  NA 144  K 4.1  CL 103  CO2 32  GLUCOSE 100*  BUN 6*  CREATININE 0.71  CALCIUM 9.4   GFR: CrCl cannot be calculated (Unknown ideal weight.). Liver Function Tests: No results for input(s): AST, ALT, ALKPHOS, BILITOT, PROT, ALBUMIN in the  last 168 hours. No results for input(s): LIPASE, AMYLASE in the last 168 hours. No results for input(s): AMMONIA in the last 168 hours. Coagulation Profile: No results for input(s): INR, PROTIME in the last 168 hours. Cardiac Enzymes: No results for input(s): CKTOTAL, CKMB, CKMBINDEX, TROPONINI in the last 168 hours. BNP (last 3 results) No results for input(s): PROBNP in the last 8760 hours. HbA1C: No results for input(s): HGBA1C in the last 72 hours. CBG: No results for input(s): GLUCAP in the last 168 hours. Lipid Profile: No results for input(s): CHOL, HDL, LDLCALC, TRIG, CHOLHDL, LDLDIRECT in the last 72 hours. Thyroid Function Tests: No results for input(s): TSH, T4TOTAL, FREET4, T3FREE, THYROIDAB in the last 72 hours. Anemia Panel: No results for input(s): VITAMINB12, FOLATE, FERRITIN, TIBC, IRON, RETICCTPCT in the last 72 hours. Urine analysis:    Component Value Date/Time   COLORURINE YELLOW 01/25/2019 1858   APPEARANCEUR HAZY (A) 01/25/2019 1858   LABSPEC 1.026 01/25/2019 1858   PHURINE 5.0 01/25/2019 1858   GLUCOSEU NEGATIVE 01/25/2019  1858   HGBUR NEGATIVE 01/25/2019 1858   BILIRUBINUR NEGATIVE 01/25/2019 [place] 01/25/2019 1858   PROTEINUR NEGATIVE 01/25/2019 1858   UROBILINOGEN 0.2 07/03/2014 1456   NITRITE NEGATIVE 01/25/2019 1858   LEUKOCYTESUR TRACE (A) 01/25/2019 1858    Radiological Exams on Admission: DG Chest Portable 1 View  Result Date: 08/27/2019 CLINICAL DATA:  Dyspnea. Shortness of breath. Productive cough. EXAM: PORTABLE CHEST 1 VIEW COMPARISON:  06/03/2019 FINDINGS: There is increased attenuation at the lung bases favored to represent breast tissue attenuation artifact. There is a airspace opacity at the left costophrenic angle which appears to be new since the prior study. There is no pneumothorax. No large pleural effusion. There is no acute osseous abnormality. IMPRESSION: New airspace opacity at the left costophrenic angle suspicious for pneumonia or atelectasis. Electronically Signed   By: Constance Holster M.D.   On: 08/27/2019 20:25    EKG: Independently reviewed.  Sinus tachycardia.  RSR prime in V1 and V2.  Nonspecific T wave abnormalities.  No significant change since prior tracing.  Assessment/Plan Principal Problem:   COPD with acute exacerbation (HCC) Active Problems:   Asthma   Chronic diastolic CHF (congestive heart failure) (HCC)   Acute on chronic respiratory failure with hypoxia (HCC)   Chest pain   Acute on chronic hypoxic respiratory failure secondary to asthma/COPD exacerbation, ?pneumonia: Continues to have wheezing but satting well on 2 L home oxygen at present. Chest x-ray showing a new airspace opacity at the left costophrenic angle suspicious for pneumonia or atelectasis.  Pneumonia felt to be less likely given no fever or leukocytosis.  SARS-CoV-2 PCR test negative. -Continue DuoNebs every 6 hours, albuterol inhaler as needed, Solu-Medrol 60 mg every 6 hours, and give IV magnesium 2 g.  Mucinex DM for cough.  Continue home Singulair. -Patient has received  ceftriaxone and azithromycin.  Check procalcitonin level. -Incentive spirometry, flutter valve -Continuous pulse ox, supplemental oxygen  Chest pain: High-sensitivity troponin negative and EKG without acute ischemic changes.  Chest pain appears to be pleuritic and right-sided.  Could possibly be musculoskeletal.  Tachycardia has now resolved after bronchodilator treatments. -PE felt to be less likely, check D-dimer level.  If elevated, will need VQ scan in a.m.  Unable to order CT angiogram as she has a high risk contrast media allergy with history of anaphylaxis/cardiac arrest.  Chronic diastolic CHF: BNP normal and no signs of volume overload at this time.  Hyperlipidemia: Continue  Lipitor  DVT prophylaxis: Lovenox Code Status: Full code Family Communication: No family available at this time. Disposition Plan: Status is: Inpatient  Remains inpatient appropriate because:Inpatient level of care appropriate due to severity of illness   Dispo: The patient is from: Home              Anticipated d/c is to: Home              Anticipated d/c date is: 2 days              Patient currently is not medically stable to d/c.  The medical decision making on this patient was of high complexity and the patient is at high risk for clinical deterioration, therefore this is a level 3 visit.  John Giovanni MD [facility]  If 7PM-7AM, please contact night-coverage [email or web address]  08/28/2019, 1:59 AM

## 2019-08-28 NOTE — Progress Notes (Signed)
Care started in the emergency room prior to midnight and patient was admitted early this morning after midnight by my partner and colleague Dr. Shela Leff I am in current agreement with her assessment and plan. Additional changes to the plan of care been made accordingly. Patient is a 64 year old female with a past medical history significant for but not limited to asthma with COPD, chronic respiratory failure on 2 L supplemental home oxygen, chronic diastolic CHF, GERD, prior seizure disorder not on any epileptics, history of anaphylaxis and cardiac arrest in the setting of contrast as well as other comorbidities presents with a 1 week history of shortness of breath, wheezing and a productive cough. She also reported sharp right lower chest pain for 3 days when she takes a deep breath in. She has been using her inhalers including albuterol, Symbicort, Spiriva as well as Flonase. She recently had an outpatient Covid test which was done and negative. On presentation she was afebrile but she was tachycardic and tachypneic and saturating well on 2 to 3 L. Initial labs showed no leukocytosis but repeat showed that she did have a leukocytosis with a white blood cell count of 15.8. Chest x-ray was done and showed a new airspace opacity at the left costophrenic angle suspicious for pneumonia or atelectasis. In the ED she was given albuterol inhaler, DuoNeb treatments, Solu-Medrol 80 mg as well as azithromycin and ceftriaxone. Currently she is being admitted for and treated for the following but not limited to:  Acute on chronic hypoxic respiratory failure secondary to asthma/COPD exacerbation, ?pneumonia -Continues to have wheezing but satting well on 2 L home oxygen at present.  -Chest x-ray showing a new airspace opacity at the left costophrenic angle suspicious for pneumonia or atelectasis.   -Pneumonia felt to be less likely given no fever or leukocytosis.   -SARS-CoV-2 PCR test negative. -Continue  DuoNebs every 6 hours, albuterol inhaler as needed, Solu-Medrol 60 mg every 6 hours, and give IV magnesium 2 g.   -Mucinex DM for cough.  Continue home Singulair. -Patient has received ceftriaxone and azithromycin.  Check procalcitonin level before continuing to treat with antibiotics -Incentive spirometry, flutter valve -SpO2: 100 % O2 Flow Rate (L/min): 2 L/min -Continuous pulse ox, supplemental oxygen -We will follow up on her VQ scan as below  Chest pain -High-sensitivity troponin negative and EKG without acute ischemic changes.  Chest pain appears to be pleuritic and right-sided.   -Could possibly be musculoskeletal.  Tachycardia has now resolved after bronchodilator treatments. -PE felt to be less likely, check D-dimer level it was elevated to 2.52.   -Since this is elevated elevated, will need VQ scan in a.m. VQ scan showed no perfusion defect to suggest pulmonary embolus  -Unable to order CT angiogram as she has a high risk contrast media allergy with history of anaphylaxis/cardiac arrest. -We will also check a lower extremity duplex given her elevated D-dimer  Chronic diastolic CHF -BNP normal and no signs of volume overload at this time. -Strict I's and O's and daily weights -Continue to monitor for signs and symptoms of volume overload  Hyperlipidemia -Continue Lipitor  Leukocytosis -Likely in the setting of steroid demargination as she received IV Solu-Medrol 80 mg -Continue to monitor and trend and repeat CBC in a.m.  Hyperglycemia -Likely to worsen in the setting of steroid demargination -Check hemoglobin A1c in the a.m. -Will continue to monitor blood sugars carefully and if necessary will place on sensitive NovoLog sliding scale insulin AC  We will continue  to monitor patient's clinical response to intervention and repeat lab blood work and imaging in the a.m.

## 2019-08-29 ENCOUNTER — Inpatient Hospital Stay (HOSPITAL_COMMUNITY): Payer: Medicare Other

## 2019-08-29 DIAGNOSIS — R079 Chest pain, unspecified: Secondary | ICD-10-CM

## 2019-08-29 LAB — COMPREHENSIVE METABOLIC PANEL
ALT: 20 U/L (ref 0–44)
AST: 21 U/L (ref 15–41)
Albumin: 3.4 g/dL — ABNORMAL LOW (ref 3.5–5.0)
Alkaline Phosphatase: 94 U/L (ref 38–126)
Anion gap: 10 (ref 5–15)
BUN: 10 mg/dL (ref 8–23)
CO2: 28 mmol/L (ref 22–32)
Calcium: 10.2 mg/dL (ref 8.9–10.3)
Chloride: 102 mmol/L (ref 98–111)
Creatinine, Ser: 0.49 mg/dL (ref 0.44–1.00)
GFR calc Af Amer: >60 mL/min (ref >60)
GFR calc non Af Amer: >60 mL/min (ref >60)
Glucose, Bld: 149 mg/dL — ABNORMAL HIGH (ref 70–99)
Potassium: 4.8 mmol/L (ref 3.5–5.1)
Sodium: 140 mmol/L (ref 135–145)
Total Bilirubin: 0.4 mg/dL (ref 0.3–1.2)
Total Protein: 6.7 g/dL (ref 6.5–8.1)

## 2019-08-29 LAB — MAGNESIUM: Magnesium: 2.1 mg/dL (ref 1.7–2.4)

## 2019-08-29 LAB — HEMOGLOBIN A1C
Hgb A1c MFr Bld: 6.4 % — ABNORMAL HIGH (ref 4.8–5.6)
Mean Plasma Glucose: 136.98 mg/dL

## 2019-08-29 LAB — CBC WITH DIFFERENTIAL/PLATELET
Abs Immature Granulocytes: 0.14 10*3/uL — ABNORMAL HIGH (ref 0.00–0.07)
Basophils Absolute: 0 10*3/uL (ref 0.0–0.1)
Basophils Relative: 0 %
Eosinophils Absolute: 0 10*3/uL (ref 0.0–0.5)
Eosinophils Relative: 0 %
HCT: 39.9 % (ref 36.0–46.0)
Hemoglobin: 11.7 g/dL — ABNORMAL LOW (ref 12.0–15.0)
Immature Granulocytes: 1 %
Lymphocytes Relative: 7 %
Lymphs Abs: 1.3 10*3/uL (ref 0.7–4.0)
MCH: 28.4 pg (ref 26.0–34.0)
MCHC: 29.3 g/dL — ABNORMAL LOW (ref 30.0–36.0)
MCV: 96.8 fL (ref 80.0–100.0)
Monocytes Absolute: 0.6 10*3/uL (ref 0.1–1.0)
Monocytes Relative: 4 %
Neutro Abs: 15.1 10*3/uL — ABNORMAL HIGH (ref 1.7–7.7)
Neutrophils Relative %: 88 %
Platelets: 238 10*3/uL (ref 150–400)
RBC: 4.12 MIL/uL (ref 3.87–5.11)
RDW: 14.8 % (ref 11.5–15.5)
WBC: 17.1 10*3/uL — ABNORMAL HIGH (ref 4.0–10.5)
nRBC: 0 % (ref 0.0–0.2)

## 2019-08-29 LAB — PHOSPHORUS: Phosphorus: 2.7 mg/dL (ref 2.5–4.6)

## 2019-08-29 LAB — GLUCOSE, CAPILLARY: Glucose-Capillary: 211 mg/dL — ABNORMAL HIGH (ref 70–99)

## 2019-08-29 MED ORDER — INSULIN ASPART 100 UNIT/ML ~~LOC~~ SOLN
0.0000 [IU] | Freq: Three times a day (TID) | SUBCUTANEOUS | Status: DC
  Administered 2019-08-30: 1 [IU] via SUBCUTANEOUS
  Administered 2019-08-30: 2 [IU] via SUBCUTANEOUS

## 2019-08-29 MED ORDER — PANTOPRAZOLE SODIUM 40 MG PO TBEC
40.0000 mg | DELAYED_RELEASE_TABLET | Freq: Every day | ORAL | Status: DC
  Administered 2019-08-29 – 2019-08-30 (×2): 40 mg via ORAL
  Filled 2019-08-29: qty 1

## 2019-08-29 MED ORDER — METHYLPREDNISOLONE SODIUM SUCC 125 MG IJ SOLR
60.0000 mg | Freq: Two times a day (BID) | INTRAMUSCULAR | Status: DC
  Administered 2019-08-29 – 2019-08-30 (×2): 60 mg via INTRAVENOUS
  Filled 2019-08-29 (×2): qty 2

## 2019-08-29 MED ORDER — POLYETHYLENE GLYCOL 3350 17 G PO PACK
17.0000 g | PACK | Freq: Every day | ORAL | Status: DC
  Filled 2019-08-29: qty 1

## 2019-08-29 MED ORDER — FUROSEMIDE 10 MG/ML IJ SOLN
40.0000 mg | Freq: Once | INTRAMUSCULAR | Status: AC
  Administered 2019-08-29: 40 mg via INTRAVENOUS
  Filled 2019-08-29: qty 4

## 2019-08-29 MED ORDER — SENNOSIDES-DOCUSATE SODIUM 8.6-50 MG PO TABS
1.0000 | ORAL_TABLET | Freq: Two times a day (BID) | ORAL | Status: DC
  Administered 2019-08-30: 1 via ORAL
  Filled 2019-08-29: qty 1

## 2019-08-29 MED ORDER — DOXYCYCLINE HYCLATE 100 MG PO TABS
100.0000 mg | ORAL_TABLET | Freq: Two times a day (BID) | ORAL | Status: DC
  Administered 2019-08-29 – 2019-08-30 (×2): 100 mg via ORAL
  Filled 2019-08-29 (×2): qty 1

## 2019-08-29 MED ORDER — INSULIN ASPART 100 UNIT/ML ~~LOC~~ SOLN
0.0000 [IU] | Freq: Every day | SUBCUTANEOUS | Status: DC
  Administered 2019-08-29: 2 [IU] via SUBCUTANEOUS

## 2019-08-29 NOTE — Progress Notes (Addendum)
Patient Saturations on Room Air at Rest   94%  Patient Saturations on [employer] while Ambulating   86%  Patient Saturations on 2 Liters of oxygen while Ambulating 95%  Pt dyspneic while ambulating on O2 @ 2 liters, returned to room and chair....recovering, panting O2 @ 2 liters 94%. Pt settled down, O2 on 2 liters 98%.  Pt currently on home O2 @ 2 liters.  MD made aware.      SRP, RN

## 2019-08-29 NOTE — Progress Notes (Signed)
PHARMACIST - PHYSICIAN COMMUNICATION  CONCERNING: Antibiotic IV to Oral Route Change Policy  RECOMMENDATION: This patient is receiving doxycycline by the intravenous route.  Based on criteria approved by the Pharmacy and Therapeutics Committee, the antibiotic(s) is/are being converted to the equivalent oral dose form(s).   DESCRIPTION: These criteria include:  Patient being treated for a respiratory tract infection, urinary tract infection, cellulitis or clostridium difficile associated diarrhea if on metronidazole  The patient is not neutropenic and does not exhibit a GI malabsorption state  The patient is eating (either orally or via tube) and/or has been taking other orally administered medications for a least 24 hours  The patient is improving clinically and has a Tmax < 100.5  If you have questions about this conversion, please contact the Pharmacy Department  []  ( [phone] )  [place] []  ( [phone] )   [facility] []  ( [phone]  [place] []  ( [phone] )  [facility] [x]  ( [phone] )  [facility]  

## 2019-08-29 NOTE — Progress Notes (Signed)
PROGRESS NOTE    Sonia Drake  WUJ:[id number] DOB: [birth year] DOA: 08/27/2019 PCP: System, Pcp Not In   Brief Narrative:  Patient is a 64 year old female with a past medical history significant for but not limited to asthma with COPD, chronic respiratory failure on 2 L supplemental home oxygen, chronic diastolic CHF, GERD, prior seizure disorder not on any epileptics, history of anaphylaxis and cardiac arrest in the setting of contrast as well as other comorbidities presents with a 1 week history of shortness of breath, wheezing and a productive cough. She also reported sharp right lower chest pain for 3 days when she takes a deep breath in. She has been using her inhalers including albuterol, Symbicort, Spiriva as well as Flonase. She recently had an outpatient Covid test which was done and negative. On presentation she was afebrile but she was tachycardic and tachypneic and saturating well on 2 to 3 L. Initial labs showed no leukocytosis but repeat showed that she did have a leukocytosis with a white blood cell count of 15.8. Chest x-ray was done and showed a new airspace opacity at the left costophrenic angle suspicious for pneumonia or atelectasis. In the ED she was given albuterol inhaler, DuoNeb treatments, Solu-Medrol 80 mg as well as azithromycin and ceftriaxone.  Current antibiotics were changed to doxycycline and she is feeling little bit better today but still remains hypoxic on room air.  VQ scan done and showed "No perfusion defect is identified to suggest acute pulmonary embolus. Normal perfusion lung scan."  No evidence of any DVT bilaterally.  She continues to improve a little bit but continues to have some leg swelling so we will give her a dose of Lasix.  WBC has risen in the setting of steroid demargination.  She is improving slowly and will need an ambulatory home O2 screen and PT OT evaluation prior to safe discharge disposition.  Assessment & Plan:   Principal Problem:   COPD with  acute exacerbation (HCC) Active Problems:   Asthma   Chronic diastolic CHF (congestive heart failure) (HCC)   Acute on chronic respiratory failure with hypoxia (HCC)   Chest pain  Acute on chronic hypoxic respiratory failure secondary to asthma/COPD exacerbation, ?pneumonia -Continues to have wheezing but satting well on 2 L home oxygen at present.  -Chest x-ray showing a new airspace opacity at the left costophrenic angle suspicious for pneumonia or atelectasis.  -Pneumonia felt to be less likely given no fever or leukocytosis. -Repeat CXR showed "Normal mediastinum and cardiac silhouette. Normal pulmonary vasculature. No evidence of effusion, infiltrate, or pneumothorax. No acute bony abnormality."  -SARS-CoV-2 PCR test negative. -Continue DuoNebs every 6 hours, albuterol inhaler as needed, Solu-Medrol 60 mg every 6 hours weaned to every 12 hours, and give IV magnesium 2 g.  -Mucinex DM for cough. Continue home Singulair. -Patient has received ceftriaxone and azithromycin. Check procalcitonin level before continuing to treat with antibiotics as since this is being treated as a COPD exacerbation we will put on doxycycline -Incentive spirometry, flutter valve -SpO2: 96 % O2 Flow Rate (L/min): 2 L/min(uses 2 lpm at home) -Continuous pulse ox, supplemental oxygen -VQ scan showed no perfusion defect to indicate a pulmonary embolus -Continue to monitor respiratory status carefully and will need ambulatory home O2 screen prior to discharge -Patient thinks she is doing better and will continue current treatment and assess her in the a.m. -We will give her a dose of IV Lasix to see if this will also help her respiratory status despite  that her chest x-ray being negative and showing no vascular congestion.  Chest Pain -High-sensitivity troponin negative and EKG without acute ischemic changes. Chest pain appears to be pleuritic and right-sided.  -Couldpossibly be musculoskeletal.  Tachycardia has now resolved after bronchodilator treatments. -PE felt to be less likely, check D-dimer level it was elevated to 2.52.  -Since this is elevated elevated, will need VQ scan in a.m. VQ scan showed no perfusion defect to suggest pulmonary embolus -Unable to order CT angiogram as she has a high risk contrast media allergy with history of anaphylaxis/cardiac arrest. -We will also check a lower extremity duplex given her elevated D-dimer and this was negative for any acute VTE -The chest pain is resolved and will need to come to monitor carefully  Chronic Diastolic CHF -BNP normal and no signs of volume overload at this time but she does have some LE edema -Will give a dose of IV Lasix 40 mg x1. -Strict I's and O's and daily weights; She is +1,090 mL since admission -Weight on admission was 220 pounds but was not repeated today -Continue to monitor for signs and symptoms of volume overload -Chest X-Ray showed "Normal mediastinum and cardiac silhouette. Normal pulmonary vasculature. No evidence of effusion, infiltrate, or pneumothorax. No acute bony abnormality." -Lower extremity swelling less likely from heart failure but likely in the setting of her poor nutritional status and low albumin  Hyperlipidemia -Continue Atorvastatin 10 mg po Daily   Leukocytosis, worsening  -Likely in the setting of steroid demargination as she received IV Solu-Medrol 80 mg -Now she is on scheduled Solu-Medrol and she was on 60 mg every 6 but we have decreased this down to every 12h -Continue to monitor and trend and repeat CBC in a.m.  Hyperglycemia -Likely to worsen in the setting of steroid demargination -Check hemoglobin A1c in the a.m. -Will continue to monitor blood sugars carefully  -Blood sugars on the daily CMP ranging from 149-173 -We will place on sensitive NovoLog sign scale insulin given that she is on high-dose steroids  Normocytic Anemia -Patient's hemoglobin/hematocrit  went from 12.3/41.8 is now 11.7/39.9 -Check anemia panel in the a.m. next-continue to monitor for signs and symptoms of bleeding; currently no overt bleeding noted -Repeat CBC in the a.m.  Morbid Obesity -Estimated body mass index is 42.97 kg/m as calculated from the following:   Height as of this encounter: 5' (1.524 m).   Weight as of this encounter: 99.8 kg. -Weight Loss and Dietary Counseling given   Constipation/probable history of GI bleeding  -Patient refused her Lovenox today as the nurse had indicated that she states that it "makes my rectal bleed] by bowel movement" -We will initiate stool softeners with standing docusate and MiraLAX 17 g p.o. daily -Continue to monitor for signs and symptoms of bleeding -We will start the patient on Protonix empirically given that she is on high-dose steroids and do not want have her get a gastric ulcer  DVT prophylaxis: Enoxaparin 40 mg sq q24h Code Status: FULL CODE  Family Communication: No family present at bedside  Disposition Plan: Patient is from home and presented with acute shortness of breath and she does have chronic respiratory failure with COPD and asthma.  She likely has another COPD exacerbation and she will need improvement in her respiratory status prior to safe discharge disposition and she will need PT and OT to further evaluate and treat.  Status is: Inpatient  Remains inpatient appropriate because:IV treatments appropriate due to intensity of illness or  inability to take PO   Dispo: The patient is from: Home              Anticipated d/c is to: Home              Anticipated d/c date is: 1 day              Patient currently is not medically stable to d/c.  Consultants:   None   Procedures:  V/Q Scan  LE Venous Duplex   Antimicrobials:  Anti-infectives (From admission, onward)   Start     Dose/Rate Route Frequency Ordered Stop   08/29/19 2200  doxycycline (VIBRA-TABS) tablet 100 mg     100 mg Oral Every 12  hours 08/29/19 1325     08/28/19 1200  doxycycline (VIBRAMYCIN) 100 mg in sodium chloride 0.9 % 250 mL IVPB  Status:  Discontinued     100 mg 125 mL/hr over 120 Minutes Intravenous Every 12 hours 08/28/19 1142 08/29/19 1325   08/27/19 2130  cefTRIAXone (ROCEPHIN) 1 g in sodium chloride 0.9 % 100 mL IVPB     1 g 200 mL/hr over 30 Minutes Intravenous  Once 08/27/19 2102 08/27/19 2154   08/27/19 2130  azithromycin (ZITHROMAX) 500 mg in sodium chloride 0.9 % 250 mL IVPB     500 mg 250 mL/hr over 60 Minutes Intravenous  Once 08/27/19 2102 08/28/19 0011     Subjective: Seen and examined at bedside and think she is doing better.  She was seated in the chair and had removed her oxygen.  While has oxygen was removed she had desaturated and was placed back on oxygen.  No nausea or vomiting.  Denies any lightheadedness or dizziness.  Think she is doing better.  No other concerns at this time.  Objective: Vitals:   08/29/19 0239 08/29/19 0538 08/29/19 0753 08/29/19 1253  BP:  (!) 146/72  137/69  Pulse:  66  86  Resp:  17  20  Temp:  98 F (36.7 C)  98.2 F (36.8 C)  TempSrc:  Oral  Oral  SpO2: 98% 99% 98% 96%  Weight:      Height:        Intake/Output Summary (Last 24 hours) at 08/29/2019 1635 Last data filed at 08/29/2019 1500 Gross per 24 hour  Intake 1090.01 ml  Output --  Net 1090.01 ml   Filed Weights   08/28/19 1551  Weight: 99.8 kg   Examination: Physical Exam:  Constitutional: WN/WD obese African-American female currently in NAD and appears calm and comfortable Eyes: Lids and conjunctivae normal, sclerae anicteric  ENMT: External Ears, Nose appear normal. Grossly normal hearing.   Neck: Appears normal, supple, no cervical masses, normal ROM, no appreciable thyromegaly; no JVD Respiratory: Diminished to auscultation bilaterally with coarse breath sounds and some mild wheezing.  No appreciable rales or rhonchi. Normal respiratory effort and patient is not tachypenic. No  accessory muscle use.  Unlabored breathing but she is wearing 2 L supplemental oxygen via nasal cannula now Cardiovascular: RRR, no murmurs / rubs / gallops. S1 and S2 auscultated.  1+ extremity edema.  Abdomen: Soft, non-tender, distended secondary to body habitus. Bowel sounds positive x4.  GU: Deferred. Musculoskeletal: No clubbing / cyanosis of digits/nails. Normal strength and muscle tone.  Skin: No rashes, lesions, ulcers on a limited skin evaluation. No induration; Warm and dry.  Neurologic: CN 2-12 grossly intact with no focal deficits. Romberg sign and cerebellar reflexes not assessed.  Psychiatric: Normal judgment and  insight. Alert and oriented x 3. Normal mood and appropriate affect.   Data Reviewed: I have personally reviewed following labs and imaging studies  CBC: Recent Labs  Lab 08/27/19 1933 08/28/19 1112 08/29/19 0510  WBC 9.7 15.8* 17.1*  NEUTROABS 6.2 14.2* 15.1*  HGB 12.6 12.3 11.7*  HCT 42.6 41.8 39.9  MCV 97.3 98.1 96.8  PLT 250 227 238   Basic Metabolic Panel: Recent Labs  Lab 08/27/19 1933 08/28/19 1112 08/29/19 0510  NA 144 143 140  K 4.1 5.0 4.8  CL 103 103 102  CO2 32 31 28  GLUCOSE 100* 173* 149*  BUN 6* 8 10  CREATININE 0.71 0.60 0.49  CALCIUM 9.4 10.0 10.2  MG  --  2.6* 2.1  PHOS  --  3.5 2.7   GFR: Estimated Creatinine Clearance: 74.4 mL/min (by C-G formula based on SCr of 0.49 mg/dL). Liver Function Tests: Recent Labs  Lab 08/28/19 1112 08/29/19 0510  AST 20 21  ALT 23 20  ALKPHOS 104 94  BILITOT 0.3 0.4  PROT 7.8 6.7  ALBUMIN 4.0 3.4*   No results for input(s): LIPASE, AMYLASE in the last 168 hours. No results for input(s): AMMONIA in the last 168 hours. Coagulation Profile: No results for input(s): INR, PROTIME in the last 168 hours. Cardiac Enzymes: No results for input(s): CKTOTAL, CKMB, CKMBINDEX, TROPONINI in the last 168 hours. BNP (last 3 results) No results for input(s): PROBNP in the last 8760 hours. HbA1C: No  results for input(s): HGBA1C in the last 72 hours. CBG: No results for input(s): GLUCAP in the last 168 hours. Lipid Profile: No results for input(s): CHOL, HDL, LDLCALC, TRIG, CHOLHDL, LDLDIRECT in the last 72 hours. Thyroid Function Tests: No results for input(s): TSH, T4TOTAL, FREET4, T3FREE, THYROIDAB in the last 72 hours. Anemia Panel: No results for input(s): VITAMINB12, FOLATE, FERRITIN, TIBC, IRON, RETICCTPCT in the last 72 hours. Sepsis Labs: Recent Labs  Lab 08/28/19 0154  PROCALCITON <0.10    Recent Results (from the past 240 hour(s))  Respiratory Panel by RT PCR (Flu A&B, Covid) - Nasopharyngeal Swab     Status: None   Collection Time: 08/27/19  8:16 PM   Specimen: Nasopharyngeal Swab  Result Value Ref Range Status   SARS Coronavirus 2 by RT PCR NEGATIVE NEGATIVE Final    Comment: (NOTE) SARS-CoV-2 target nucleic acids are NOT DETECTED. The SARS-CoV-2 RNA is generally detectable in upper respiratoy specimens during the acute phase of infection. The lowest concentration of SARS-CoV-2 viral copies this assay can detect is 131 copies/mL. A negative result does not preclude SARS-Cov-2 infection and should not be used as the sole basis for treatment or other patient management decisions. A negative result may occur with  improper specimen collection/handling, submission of specimen other than nasopharyngeal swab, presence of viral mutation(s) within the areas targeted by this assay, and inadequate number of viral copies (<131 copies/mL). A negative result must be combined with clinical observations, patient history, and epidemiological information. The expected result is Negative. Fact Sheet for Patients:  https://www.moore.com/https://www.fda.gov/media/142436/download Fact Sheet for Healthcare Providers:  https://www.young.biz/https://www.fda.gov/media/142435/download This test is not yet ap proved or cleared by the [place] FDA and  has been authorized for detection and/or diagnosis of SARS-CoV-2 by FDA  under an Emergency Use Authorization (EUA). This EUA will remain  in effect (meaning this test can be used) for the duration of the COVID-19 declaration under Section 564(b)(1) of the Act, 21 U.S.C. section 360bbb-3(b)(1), unless the authorization is terminated or revoked  sooner.    Influenza A by PCR NEGATIVE NEGATIVE Final   Influenza B by PCR NEGATIVE NEGATIVE Final    Comment: (NOTE) The Xpert Xpress SARS-CoV-2/FLU/RSV assay is intended as an aid in  the diagnosis of influenza from Nasopharyngeal swab specimens and  should not be used as a sole basis for treatment. Nasal washings and  aspirates are unacceptable for Xpert Xpress SARS-CoV-2/FLU/RSV  testing. Fact Sheet for Patients: https://www.moore.com/ Fact Sheet for Healthcare Providers: https://www.young.biz/ This test is not yet approved or cleared by the [place] FDA and  has been authorized for detection and/or diagnosis of SARS-CoV-2 by  FDA under an Emergency Use Authorization (EUA). This EUA will remain  in effect (meaning this test can be used) for the duration of the  Covid-19 declaration under Section 564(b)(1) of the Act, 21  U.S.C. section 360bbb-3(b)(1), unless the authorization is  terminated or revoked. Performed at [facility], [address], [place], [place] [address]     RN Pressure Injury Documentation:     Estimated body mass index is 42.97 kg/m as calculated from the following:   Height as of this encounter: 5' (1.524 m).   Weight as of this encounter: 99.8 kg.  Malnutrition Type:      Malnutrition Characteristics:      Nutrition Interventions:      Radiology Studies: NM Pulmonary Perfusion  Result Date: 08/28/2019 CLINICAL DATA:  Shortness of breath EXAM: NUCLEAR MEDICINE PERFUSION LUNG SCAN TECHNIQUE: Perfusion images were obtained in multiple projections after intravenous injection of radiopharmaceutical. Ventilation  scans intentionally deferred if perfusion scan and chest x-ray adequate for interpretation during COVID 19 epidemic. RADIOPHARMACEUTICALS:  1.5 mCi Tc-27m MAA IV COMPARISON:  Chest radiograph from 08/27/2019 FINDINGS: No perfusion defect is identified within either lung to suggest acute pulmonary embolus. Accordingly, ventilation was not performed. IMPRESSION: 1. No perfusion defect is identified to suggest acute pulmonary embolus. Normal perfusion lung scan. Electronically Signed   By: Gaylyn Rong M.D.   On: 08/28/2019 13:04   DG CHEST PORT 1 VIEW  Result Date: 08/29/2019 CLINICAL DATA:  Short of breath EXAM: PORTABLE CHEST 1 VIEW COMPARISON:  None. FINDINGS: Normal mediastinum and cardiac silhouette. Normal pulmonary vasculature. No evidence of effusion, infiltrate, or pneumothorax. No acute bony abnormality. IMPRESSION: No acute cardiopulmonary process. Electronically Signed   By: Genevive Bi M.D.   On: 08/29/2019 06:47   DG Chest Portable 1 View  Result Date: 08/27/2019 CLINICAL DATA:  Dyspnea. Shortness of breath. Productive cough. EXAM: PORTABLE CHEST 1 VIEW COMPARISON:  06/03/2019 FINDINGS: There is increased attenuation at the lung bases favored to represent breast tissue attenuation artifact. There is a airspace opacity at the left costophrenic angle which appears to be new since the prior study. There is no pneumothorax. No large pleural effusion. There is no acute osseous abnormality. IMPRESSION: New airspace opacity at the left costophrenic angle suspicious for pneumonia or atelectasis. Electronically Signed   By: Katherine Mantle M.D.   On: 08/27/2019 20:25   VAS [name] LOWER EXTREMITY VENOUS (DVT)  Result Date: 08/29/2019  Lower Venous DVTStudy Indications: Elevated D-dimer and worsening SOB, and SOB.  Performing Technologist: Levada Schilling RDMS, RVT  Examination Guidelines: A complete evaluation includes B-mode imaging, spectral Doppler, color Doppler, and power Doppler as needed  of all accessible portions of each vessel. Bilateral testing is considered an integral part of a complete examination. Limited examinations for reoccurring indications may be performed as noted. The reflux portion of the exam is  performed with the patient in reverse Trendelenburg.  +---------+---------------+---------+-----------+----------+--------------+ RIGHT    CompressibilityPhasicitySpontaneityPropertiesThrombus Aging +---------+---------------+---------+-----------+----------+--------------+ CFV      Full           Yes      Yes                                 +---------+---------------+---------+-----------+----------+--------------+ SFJ      Full                                                        +---------+---------------+---------+-----------+----------+--------------+ FV Prox  Full                                                        +---------+---------------+---------+-----------+----------+--------------+ FV Mid   Full                                                        +---------+---------------+---------+-----------+----------+--------------+ FV DistalFull                                                        +---------+---------------+---------+-----------+----------+--------------+ PFV      Full                                                        +---------+---------------+---------+-----------+----------+--------------+ POP      Full           Yes      Yes                                 +---------+---------------+---------+-----------+----------+--------------+ PTV      Full                                                        +---------+---------------+---------+-----------+----------+--------------+ PERO     Full                                                        +---------+---------------+---------+-----------+----------+--------------+ GSV      Full                                                         +---------+---------------+---------+-----------+----------+--------------+   +---------+---------------+---------+-----------+----------+--------------+  LEFT     CompressibilityPhasicitySpontaneityPropertiesThrombus Aging +---------+---------------+---------+-----------+----------+--------------+ CFV      Full           Yes      Yes                                 +---------+---------------+---------+-----------+----------+--------------+ SFJ      Full                                                        +---------+---------------+---------+-----------+----------+--------------+ FV Prox  Full                                                        +---------+---------------+---------+-----------+----------+--------------+ FV Mid   Full                                                        +---------+---------------+---------+-----------+----------+--------------+ FV DistalFull                                                        +---------+---------------+---------+-----------+----------+--------------+ PFV      Full                                                        +---------+---------------+---------+-----------+----------+--------------+ POP      Full           Yes      Yes                                 +---------+---------------+---------+-----------+----------+--------------+ PTV      Full                                                        +---------+---------------+---------+-----------+----------+--------------+ PERO     Full                                                        +---------+---------------+---------+-----------+----------+--------------+ GSV      Full                                                        +---------+---------------+---------+-----------+----------+--------------+  Summary: RIGHT: - There is no evidence of deep vein thrombosis in the lower extremity.  - No cystic structure  found in the popliteal fossa.  LEFT: - There is no evidence of deep vein thrombosis in the lower extremity.  - No cystic structure found in the popliteal fossa.  *See table(s) above for measurements and observations. Electronically signed by Waverly Ferrari MD on 08/29/2019 at 7:21:38 AM.    Final    Scheduled Meds: . atorvastatin  10 mg Oral Daily  . calcium-vitamin D  1 tablet Oral Q breakfast  . dextromethorphan-guaiFENesin  1 tablet Oral BID  . doxycycline  100 mg Oral Q12H  . enoxaparin (LOVENOX) injection  40 mg Subcutaneous Q24H  . ipratropium-albuterol  3 mL Nebulization Q6H  . methylPREDNISolone (SOLU-MEDROL) injection  60 mg Intravenous Q12H  . montelukast  10 mg Oral Daily  . senna-docusate  1 tablet Oral BID   Continuous Infusions:   LOS: 1 day    Merlene Laughter, DO [facility] PAGER is on AMION  If 7PM-7AM, please contact night-coverage [email or web address]

## 2019-08-29 NOTE — Progress Notes (Signed)
Refused Lovenox this am. States, "it causes my rectum to bleed when she have BM" Will update MD, may need stool softner. SRP,RN

## 2019-08-29 NOTE — Plan of Care (Signed)
  Problem: Education: Goal: Knowledge of General Education information will improve Description Including pain rating scale, medication(s)/side effects and non-pharmacologic comfort measures Outcome: Progressing   

## 2019-08-30 ENCOUNTER — Inpatient Hospital Stay (HOSPITAL_COMMUNITY): Payer: Medicare Other

## 2019-08-30 DIAGNOSIS — I5033 Acute on chronic diastolic (congestive) heart failure: Secondary | ICD-10-CM

## 2019-08-30 LAB — COMPREHENSIVE METABOLIC PANEL
ALT: 23 U/L (ref 0–44)
AST: 24 U/L (ref 15–41)
Albumin: 3.3 g/dL — ABNORMAL LOW (ref 3.5–5.0)
Alkaline Phosphatase: 85 U/L (ref 38–126)
Anion gap: 11 (ref 5–15)
BUN: 15 mg/dL (ref 8–23)
CO2: 32 mmol/L (ref 22–32)
Calcium: 9.9 mg/dL (ref 8.9–10.3)
Chloride: 100 mmol/L (ref 98–111)
Creatinine, Ser: 0.75 mg/dL (ref 0.44–1.00)
GFR calc Af Amer: >60 mL/min (ref >60)
GFR calc non Af Amer: >60 mL/min (ref >60)
Glucose, Bld: 178 mg/dL — ABNORMAL HIGH (ref 70–99)
Potassium: 3.8 mmol/L (ref 3.5–5.1)
Sodium: 143 mmol/L (ref 135–145)
Total Bilirubin: 0.6 mg/dL (ref 0.3–1.2)
Total Protein: 6.5 g/dL (ref 6.5–8.1)

## 2019-08-30 LAB — CBC WITH DIFFERENTIAL/PLATELET
Abs Immature Granulocytes: 0.17 10*3/uL — ABNORMAL HIGH (ref 0.00–0.07)
Basophils Absolute: 0 10*3/uL (ref 0.0–0.1)
Basophils Relative: 0 %
Eosinophils Absolute: 0 10*3/uL (ref 0.0–0.5)
Eosinophils Relative: 0 %
HCT: 40 % (ref 36.0–46.0)
Hemoglobin: 12 g/dL (ref 12.0–15.0)
Immature Granulocytes: 1 %
Lymphocytes Relative: 7 %
Lymphs Abs: 1.2 10*3/uL (ref 0.7–4.0)
MCH: 28.8 pg (ref 26.0–34.0)
MCHC: 30 g/dL (ref 30.0–36.0)
MCV: 95.9 fL (ref 80.0–100.0)
Monocytes Absolute: 0.6 10*3/uL (ref 0.1–1.0)
Monocytes Relative: 4 %
Neutro Abs: 16.1 10*3/uL — ABNORMAL HIGH (ref 1.7–7.7)
Neutrophils Relative %: 88 %
Platelets: 270 10*3/uL (ref 150–400)
RBC: 4.17 MIL/uL (ref 3.87–5.11)
RDW: 14.9 % (ref 11.5–15.5)
WBC: 18.1 10*3/uL — ABNORMAL HIGH (ref 4.0–10.5)
nRBC: 0 % (ref 0.0–0.2)

## 2019-08-30 LAB — IRON AND TIBC
Iron: 54 ug/dL (ref 28–170)
Saturation Ratios: 15 % (ref 10.4–31.8)
TIBC: 349 ug/dL (ref 250–450)
UIBC: 295 ug/dL

## 2019-08-30 LAB — MAGNESIUM: Magnesium: 1.8 mg/dL (ref 1.7–2.4)

## 2019-08-30 LAB — GLUCOSE, CAPILLARY
Glucose-Capillary: 149 mg/dL — ABNORMAL HIGH (ref 70–99)
Glucose-Capillary: 196 mg/dL — ABNORMAL HIGH (ref 70–99)

## 2019-08-30 LAB — RETICULOCYTES
Immature Retic Fract: 26.1 % — ABNORMAL HIGH (ref 2.3–15.9)
RBC.: 4.19 MIL/uL (ref 3.87–5.11)
Retic Count, Absolute: 59.5 10*3/uL (ref 19.0–186.0)
Retic Ct Pct: 1.4 % (ref 0.4–3.1)

## 2019-08-30 LAB — FERRITIN: Ferritin: 41 ng/mL (ref 11–307)

## 2019-08-30 LAB — VITAMIN B12: Vitamin B-12: 210 pg/mL (ref 180–914)

## 2019-08-30 LAB — HEMOGLOBIN A1C
Hgb A1c MFr Bld: 6.5 % — ABNORMAL HIGH (ref 4.8–5.6)
Mean Plasma Glucose: 139.85 mg/dL

## 2019-08-30 LAB — FOLATE: Folate: 6.6 ng/mL (ref >5.9)

## 2019-08-30 LAB — PHOSPHORUS: Phosphorus: 3.3 mg/dL (ref 2.5–4.6)

## 2019-08-30 MED ORDER — SENNOSIDES-DOCUSATE SODIUM 8.6-50 MG PO TABS: 1.0000 | ORAL_TABLET | Freq: Every day | ORAL | 0 refills | Status: AC

## 2019-08-30 MED ORDER — DOXYCYCLINE HYCLATE 100 MG PO TABS: 100.0000 mg | ORAL_TABLET | Freq: Two times a day (BID) | ORAL | 0 refills | Status: AC

## 2019-08-30 MED ORDER — PREDNISONE 10 MG (21) PO TBPK: ORAL_TABLET | ORAL | 0 refills | Status: DC

## 2019-08-30 MED ORDER — FUROSEMIDE 10 MG/ML IJ SOLN
40.0000 mg | Freq: Once | INTRAMUSCULAR | Status: AC
  Administered 2019-08-30: 40 mg via INTRAVENOUS
  Filled 2019-08-30: qty 4

## 2019-08-30 MED ORDER — POLYETHYLENE GLYCOL 3350 17 G PO PACK: 17.0000 g | PACK | Freq: Every day | ORAL | 0 refills | Status: DC

## 2019-08-30 MED ORDER — FUROSEMIDE 20 MG PO TABS
20.0000 mg | ORAL_TABLET | Freq: Every day | ORAL | 0 refills | Status: AC
Start: 2019-08-30 — End: 2019-09-29

## 2019-08-30 MED ORDER — GUAIFENESIN ER 600 MG PO TB12: 600.0000 mg | ORAL_TABLET | Freq: Two times a day (BID) | ORAL | 0 refills | Status: AC

## 2019-08-30 NOTE — Progress Notes (Signed)
Discharge/HF  Instructions reviewed. Patient denied questions and or concerns a this time.  No change from am assessment. Pt encouraged to wear oxygen at 2 liters continuously and to follow up with providers post discharge.

## 2019-08-30 NOTE — Evaluation (Signed)
Occupational Therapy Evaluation and Discharge Patient Details Name: Sonia Drake MRN: [id number] DOB: [birth year] Today's Date: 08/30/2019    History of Present Illness 64 yo female admitted with COPD exac. Hx of COPD-wears O2 @ night, CHF, Sz, OA, L THA 2018   Clinical Impression   This 64 yo female admitted with above presents to acute OT with all education completed and AE issued. Pt to D/C home today, we will sign off.    Follow Up Recommendations  No OT follow up;Supervision/Assistance - 24 hour    Equipment Recommendations  None recommended by OT       Precautions / Restrictions Precautions Precautions: Fall Precaution Comments: monitor O2 Restrictions Weight Bearing Restrictions: No      Mobility Bed Mobility               General bed mobility comments: oob in recliner  Transfers Overall transfer level: Needs assistance Equipment used: None Transfers: Sit to/from Stand Sit to Stand: Supervision                  ADL either performed or assessed with clinical judgement   ADL Overall ADL's : Needs assistance/impaired Eating/Feeding: Independent;Sitting   Grooming: Set up;Sitting   Upper Body Bathing: Set up;Sitting   Lower Body Bathing: Set up;Supervison/ safety;With adaptive equipment;Sit to/from stand Lower Body Bathing Details (indicate cue type and reason): Long handled sponge Upper Body Dressing : Set up;Sitting   Lower Body Dressing: Set up;Supervision/safety;With adaptive equipment;Sit to/from stand Lower Body Dressing Details (indicate cue type and reason): sock aid, reacher Toilet Transfer: Supervision/safety;Stand-pivot   Toileting- Clothing Manipulation and Hygiene: Supervision/safety;Sit to/from stand         General ADL Comments: Issued pt a reacher, sock aid, and long handled sponge. Issued pt energy conservation handout.     Vision Patient Visual Report: No change from baseline              Pertinent Vitals/Pain  Pain Assessment: No/denies pain     Hand Dominance  right   Extremity/Trunk Assessment Upper Extremity Assessment Upper Extremity Assessment: Overall WFL for tasks assessed           Communication  no issues   Cognition Arousal/Alertness: Awake/alert Behavior During Therapy: WFL for tasks assessed/performed Overall Cognitive Status: Within Functional Limits for tasks assessed                                                Home Living Family/patient expects to be discharged to:: Private residence Living Arrangements: Children Available Help at Discharge: Personal care attendant;Available PRN/intermittently Type of Home: House             Bathroom Shower/Tub: Tub/shower unit;Curtain   Bathroom Toilet: Standard Bathroom Accessibility: Yes How Accessible: Accessible via walker Home Equipment: Walker - 2 wheels;Bedside commode;Tub bench;Cane - single point;Wheelchair - manual          Prior Functioning/Environment Level of Independence: Needs assistance  Gait / Transfers Assistance Needed: Uses SPC for most ambulation ADL's / Homemaking Assistance Needed: Has a CNA 7 days a week/24 hours a day that A with whatever the patient needs            OT Problem List: Impaired balance (sitting and/or standing);Decreased activity tolerance         OT Goals(Current goals can be found in the care plan  section) Acute Rehab OT Goals Patient Stated Goal: to go home  today  OT Frequency:                AM-PAC OT "6 Clicks" Daily Activity     Outcome Measure Help from another person eating meals?: None Help from another person taking care of personal grooming?: A Little Help from another person toileting, which includes using toliet, bedpan, or urinal?: A Little Help from another person bathing (including washing, rinsing, drying)?: A Little Help from another person to put on and taking off regular upper body clothing?: A Little Help from another  person to put on and taking off regular lower body clothing?: A Little 6 Click Score: 19   End of Session Equipment Utilized During Treatment: Gait belt;Oxygen(2 liters) Nurse Communication: (pt will need to stay on O2 until she gets in car and gets ready to ride away (pick up person does not have O2 with her --but pt has at home here in [place]))  Activity Tolerance: Patient tolerated treatment well(had to take to rests to do purse lipped breathing--she intiated on her own) Patient left: in chair;with call bell/phone within reach  OT Visit Diagnosis: Unsteadiness on feet (R26.81);Muscle weakness (generalized) (M62.81)                Time: 5621-3086 OT Time Calculation (min): 41 min Charges:  OT General Charges $OT Visit: 1 Visit OT Evaluation $OT Eval Moderate Complexity: 1 Mod OT Treatments $Self Care/Home Management : 23-37 mins  Golden Circle, OTR/L Acute [employer] Pager [phone] Office [phone]     Almon Register 08/30/2019, 3:10 PM

## 2019-08-30 NOTE — Evaluation (Signed)
Physical Therapy Evaluation-1x Patient Details Name: Sonia Drake MRN: [id number] DOB: [birth year] Today's Date: 08/30/2019   History of Present Illness  64 yo female admitted with COPD exac. Hx of COPD-wears O2 @ night, CHF, Sz, OA, L THA 2018  Clinical Impression  On eval, pt was Mod Ind with mobility. She walked ~300 feet around the unit. O2 88% on RA during ambulation. Dyspnea 2/4. No PT needs. 1x eval. Will sign off.     Follow Up Recommendations No PT follow up    Equipment Recommendations  None recommended by PT    Recommendations for Other Services       Precautions / Restrictions Precautions Precautions: Fall Precaution Comments: monitor O2 Restrictions Weight Bearing Restrictions: No      Mobility  Bed Mobility               General bed mobility comments: oob in recliner  Transfers Overall transfer level: Modified independent Equipment used: None Transfers: Sit to/from Stand Sit to Stand: Modified independent (Device/Increase time)            Ambulation/Gait Ambulation/Gait assistance: Modified independent (Device/Increase time) Gait Distance (Feet): 300 Feet Assistive device: Rolling walker (2 wheeled) Gait Pattern/deviations: Step-through pattern     General Gait Details: O2 88% on RA during walk. Dyspnea 2/4. 3 brief standing rest breaks taken. Cues for pursed lip/deep breathing.  Stairs            Wheelchair Mobility    Modified Rankin (Stroke Patients Only)       Balance Overall balance assessment: Mild deficits observed, not formally tested                                           Pertinent Vitals/Pain Pain Assessment: No/denies pain    Home Living Family/patient expects to be discharged to:: (P) Private residence Living Arrangements: (P) Children   Type of Home: (P) House Home Access: (P) Stairs to enter Entrance Stairs-Rails: (P) Right Entrance Stairs-Number of Steps: (P) 3 Home Layout: (P)  One level Home Equipment: (P) Walker - 2 wheels;Bedside commode;Cane - single point;Wheelchair - manual      Prior Function                 Hand Dominance        Extremity/Trunk Assessment   Upper Extremity Assessment Upper Extremity Assessment: Defer to OT evaluation    Lower Extremity Assessment Lower Extremity Assessment: Generalized weakness    Cervical / Trunk Assessment Cervical / Trunk Assessment: Normal  Communication      Cognition Arousal/Alertness: Awake/alert Behavior During Therapy: WFL for tasks assessed/performed Overall Cognitive Status: Within Functional Limits for tasks assessed                                        General Comments      Exercises     Assessment/Plan    PT Assessment Patent does not need any further PT services  PT Problem List         PT Treatment Interventions      PT Goals (Current goals can be found in the Care Plan section)  Acute Rehab PT Goals Patient Stated Goal: none stated PT Goal Formulation: All assessment and education complete, DC therapy    Frequency  Barriers to discharge        Co-evaluation               AM-PAC PT "6 Clicks" Mobility  Outcome Measure Help needed turning from your back to your side while in a flat bed without using bedrails?: None Help needed moving from lying on your back to sitting on the side of a flat bed without using bedrails?: None Help needed moving to and from a bed to a chair (including a wheelchair)?: None Help needed standing up from a chair using your arms (e.g., wheelchair or bedside chair)?: None Help needed to walk in hospital room?: None Help needed climbing 3-5 steps with a railing? : None 6 Click Score: 24    End of Session Equipment Utilized During Treatment: Gait belt Activity Tolerance: Patient tolerated treatment well Patient left: in chair;with call bell/phone within reach        Time: 1031-1042 PT Time Calculation  (min) (ACUTE ONLY): 11 min   Charges:   PT Evaluation $PT Eval Low Complexity: 1 Low            Christoffer Currier P, PT Acute Rehabilitation

## 2019-08-30 NOTE — Discharge Summary (Signed)
Physician Discharge Summary  Sonia Drake YPP:[id number] DOB: 1955/10/11 DOA: 08/27/2019  PCP: System, Pcp Not In  Admit date: 08/27/2019 Discharge date: 08/30/2019  Admitted From: Home Disposition: Home  Recommendations for Outpatient Follow-up:  1. Follow up with PCP in 1-2 weeks 2. Follow up with Dr. Einar Gip within 1-2 weeks and have outpatient ECHO 3. Follow up with Pulmonary within 1-2 weeks  4. Please obtain CMP/CBC, Mag, Phos in one week 5. Please follow up on the following pending results:  Home Health: No  Equipment/Devices: None   Discharge Condition: Stable CODE STATUS: FULL CODE Diet recommendation: Heart Healthy Diet with 1500 mL Fluid Restriction  Brief/Interim Summary: Patient is a 64 year old female with a past medical history significant for but not limited to asthma with COPD, chronic respiratory failure on 2 L supplemental home oxygen, chronic diastolic CHF, GERD, prior seizure disorder not on any epileptics, history of anaphylaxis and cardiac arrest in the setting of contrast as well as other comorbidities presents with a 1 week history of shortness of breath,wheezing and a productive cough. She also reported sharp right lower chest pain for 3 days when she takes a deep breath in. She has been using her inhalers including albuterol, Symbicort, Spiriva as well as Flonase. She recently had an outpatient Covid test which was done and negative. On presentation she was afebrile but she was tachycardic and tachypneic and saturating well on 2 to 3 L. Initial labs showed no leukocytosis but repeat showed that she did have a leukocytosis with a white blood cell count of 15.8. Chest x-ray was done and showed a new airspace opacity at the left costophrenic angle suspicious for pneumonia or atelectasis. In the ED she was given albuterol inhaler, DuoNeb treatments, Solu-Medrol 80 mg as well as azithromycin and ceftriaxone.  Current antibiotics were changed to doxycycline and she is feeling  little bit better today but still remains hypoxic on room air.  VQ scan done and showed "No perfusion defect is identified to suggest acute pulmonary embolus. Normal perfusion lung scan."  No evidence of any DVT bilaterally.  She continues to improve a little bit but continues to have some leg swelling so we will give her a dose of Lasix.  WBC has risen in the setting of steroid demargination.  She is improving slowly and will need an ambulatory home O2 screen and PT OT evaluation prior to safe discharge disposition.   Discharge Diagnoses:  Principal Problem:   COPD with acute exacerbation ([place]) Active Problems:   Asthma   Chronic diastolic CHF (congestive heart failure) (HCC)   Acute on chronic respiratory failure with hypoxia (HCC)   Chest pain  Acute on chronic hypoxic respiratory failure secondary to asthma/COPD exacerbation, ?pneumonia and likely concomitant Acute on Chronic Diatstolic CH Exacerbation -Continues to have wheezing but satting well on 2 L home oxygen at present. -Admission Chest x-ray showing a new airspace opacity at the left costophrenic angle suspicious for pneumonia or atelectasis. -Pneumonia felt to be less likely given no fever or leukocytosis. -Repeat CXR this AM showed "Mild pulmonary vascular congestion without frank edema. 2. Minimal bibasilar atelectasis." -Continue DuoNebs every 6 hours, albuterol inhaler as needed, Solu-Medrol 60 mg every 6 hours weaned to every 12 hours and changed to po Stera Pred, and give IV magnesium 2 g. -Mucinex DM for cough. Continue home Singulair. -Patient has received ceftriaxone and azithromycin. Check procalcitonin levelbefore continuing to treat with antibiotics as since this is being treated as a COPD exacerbation we will  put on doxycycline and continue for 5 Days  -Incentive spirometry, flutter valve -SpO2: 96 % O2 Flow Rate (L/min): 2 L/min(uses 2 lpm at home) -Continuous pulse ox, supplemental oxygen -VQ scan showed  no perfusion defect to indicate a pulmonary embolus -Continue to monitor respiratory status carefully and will need ambulatory home O2 screen prior to discharge and she dose desaturate without O2 -Patient thinks she is doing better and will continue current treatment and assess her in the a.m. -We will give her a dose of IV Lasix to see if this will also help her respiratory status despite that her chest x-ray being negative and showing no vascular congestion yesterday but did show this AM so will give another dose of Lasix. -Respiratory Status is stable and improved and she is stable to D/C Home  Acute on Chronic Diastolic CHF -BNP normal but she does have a large body habitus and it is unreliable. She does have some LE edema -CXR as above and did have some LE Swelling -Given IV Lasix 40 mg  this AM as well as yesterday with improvement -Strict I's and O's and Daily Weights -She is +850 mL since Admission -Weight is 219 and relatively stable -Continue to monitor for signs and symptoms of volume overload -Chest X-Ray As above -Lower extremity swelling could be from heart failure but also likely in the setting of her poor nutritional status and low albumin -Discussed with Cardiology Dr. Jacinto HalimGanji and she will have a follow up appointment within 1 week and have outpatient ECHO -Will D/C on po Lasix 20 mg Daily   Chest Pain -High-sensitivity troponin negative and EKG without acute ischemic changes. Chest pain appears to be pleuritic and right-sided and improved. -Couldpossibly be musculoskeletal. Tachycardia has now resolved after bronchodilator treatments. -PE felt to be less likely, check D-dimer levelit was elevated to 2.52. -Since this is elevatedelevated, will need VQ scan in a.m.VQ scan showed no perfusion defect to suggest pulmonary embolus -Unable to order CT angiogram as she has a high risk contrast media allergy with history of anaphylaxis/cardiac arrest. -We will also check a  lower extremity duplex given her elevated D-dimer and this was negative for any acute VTE -The chest pain is resolved and will need to come to monitor carefully -Follow up with Cardiology in the outpatient setting   Hyperlipidemia -Continue Atorvastatin 10 mg po Daily   Leukocytosis, worsening  -Likely in the setting of steroid demargination as she received IV Solu-Medrol 80 mg -Now she is on scheduled Solu-Medrol and she was on 60 mg every 6 but we have decreased this down to every 12h and have changed this to p.o. prednisone taper -WBC has worsened and gone to 18,100 -Continue to monitor and trend and repeat CBC within 1 week as the leukocytosis is likely secondary to steroid demargination  Hyperglycemia in setting of diabetes mellitus type 2 -Likely to worsen in the setting of steroid demargination -Check hemoglobin A1c in the a.m and she is is with a hemoglobin 6.5; had a previous history of diabetes with a hemoglobin A1c of 6.5 -Will continue to monitor blood sugars carefully  -Blood sugars on the daily CMP ranging from 149-211 -We will place on sensitive NovoLog sign scale insulin given that she is on high-dose steroids while hospitalized will need to follow-up with PCP for further evaluation recommendations  Normocytic Anemia -Patient's hemoglobin/hematocrit went from 12.3/41.8 to 11.7/39.9 and is now 12.0/40.0 -Check anemia panel in the a.m. and showed an iron level of 54, U  IBC 295, TIBC 349, saturation ratios of 15%, ferritin level 41, folate was 6.6, vitamin B-12 of 210 -continue to monitor for signs and symptoms of bleeding; currently no overt bleeding noted -Repeat CBC with me in 1 week  Morbid Obesity -Estimated body mass index is 42.97 kg/m as calculated from the following:   Height as of this encounter: 5' (1.524 m).   Weight as of this encounter: 99.8 kg. -Weight Loss and Dietary Counseling given   Constipation/probable history of GI bleeding  -Patient  refused her Lovenox today as the nurse had indicated that she states that it "makes my rectal bleed] by bowel movement" -We will initiate stool softeners with standing docusate and MiraLAX 17 g p.o. daily -Continue to monitor for signs and symptoms of bleeding -We will start the patient on Protonix empirically given that she is on high-dose steroids and do not want have her get a gastric ulcer -follow-up in outpatient setting with PCP  Discharge Instructions  Discharge Instructions    (HEART FAILURE PATIENTS) Call MD:  Anytime you have any of the following symptoms: 1) 3 pound weight gain in 24 hours or 5 pounds in 1 week 2) shortness of breath, with or without a dry hacking cough 3) swelling in the hands, feet or stomach 4) if you have to sleep on extra pillows at night in order to breathe.   Complete by: As directed    Call MD for:  difficulty breathing, headache or visual disturbances   Complete by: As directed    Call MD for:  extreme fatigue   Complete by: As directed    Call MD for:  hives   Complete by: As directed    Call MD for:  persistant dizziness or light-headedness   Complete by: As directed    Call MD for:  persistant nausea and vomiting   Complete by: As directed    Call MD for:  redness, tenderness, or signs of infection (pain, swelling, redness, odor or green/yellow discharge around incision site)   Complete by: As directed    Call MD for:  severe uncontrolled pain   Complete by: As directed    Call MD for:  temperature >100.4   Complete by: As directed    Diet - low sodium heart healthy   Complete by: As directed    Discharge instructions   Complete by: As directed    You were cared for by a hospitalist during your hospital stay. If you have any questions about your discharge medications or the care you received while you were in the hospital after you are discharged, you can call the unit and ask to speak with the hospitalist on call if the hospitalist that took  care of you is not available. Once you are discharged, your primary care physician will handle any further medical issues. Please note that NO REFILLS for any discharge medications will be authorized once you are discharged, as it is imperative that you return to your primary care physician (or establish a relationship with a primary care physician if you do not have one) for your aftercare needs so that they can reassess your need for medications and monitor your lab values.  Follow up with PCP, Pulmonary, and Cardiology in the outpatient setting. Repeat ECHO as an outpatient. Take all medications as prescribed. If symptoms change or worsen please return to the ED for evaluation   Increase activity slowly   Complete by: As directed      Allergies as  of 08/30/2019      Reactions   Contrast Media [iodinated Diagnostic Agents] Anaphylaxis, Other (See Comments)   ** CARDIAC ARREST **   Peanuts [peanut Oil] Shortness Of Breath, Swelling   Shellfish Allergy Shortness Of Breath, Swelling   Soap Shortness Of Breath, Swelling   LAUNDRY DETERGENT   Latex Hives   Eggs Or Egg-derived Products Rash      Medication List    TAKE these medications   albuterol 108 (90 Base) MCG/ACT inhaler Commonly known as: VENTOLIN HFA Inhale 2 puffs into the lungs every 6 (six) hours as needed for wheezing or shortness of breath.   albuterol (2.5 MG/3ML) 0.083% nebulizer solution Commonly known as: PROVENTIL Take 3 mLs (2.5 mg total) by nebulization every 6 (six) hours as needed for wheezing or shortness of breath.   atorvastatin 10 MG tablet Commonly known as: LIPITOR Take 10 mg by mouth daily.   budesonide-formoterol 160-4.5 MCG/ACT inhaler Commonly known as: SYMBICORT Inhale 2 puffs into the lungs 2 (two) times daily.   calcium-vitamin D 500-200 MG-UNIT tablet Commonly known as: OSCAL WITH D Take 1 tablet by mouth daily with breakfast.   doxycycline 100 MG tablet Commonly known as: VIBRA-TABS Take 1  tablet (100 mg total) by mouth every 12 (twelve) hours for 5 days.   Flovent Diskus 250 MCG/BLIST Aepb Generic drug: Fluticasone Propionate (Inhal) Inhale 1 puff into the lungs in the morning and at bedtime.   fluticasone 50 MCG/ACT nasal spray Commonly known as: FLONASE Place 1 spray into both nostrils daily.   furosemide 20 MG tablet Commonly known as: Lasix Take 1 tablet (20 mg total) by mouth daily.   guaiFENesin 600 MG 12 hr tablet Commonly known as: MUCINEX Take 1 tablet (600 mg total) by mouth 2 (two) times daily for 5 days.   ipratropium-albuterol 0.5-2.5 (3) MG/3ML Soln Commonly known as: DUONEB Inhale 3 mLs into the lungs every 6 (six) hours as needed (SOB, wheezing).   MAGNESIUM PO Take 1 tablet by mouth daily.   montelukast 10 MG tablet Commonly known as: SINGULAIR Take 1 tablet (10 mg total) by mouth daily.   pantoprazole 20 MG tablet Commonly known as: PROTONIX Take 20 mg by mouth daily as needed for indigestion.   polyethylene glycol 17 g packet Commonly known as: MIRALAX / GLYCOLAX Take 17 g by mouth daily. Start taking on: Aug 31, 2019   POTASSIMIN PO Take 1 tablet by mouth daily.   predniSONE 10 MG (21) Tbpk tablet Commonly known as: STERAPRED UNI-PAK 21 TAB Take 6 tablets on day 1, 5 tablets on day 2, 4 tablets on day 3, 3 tablets on day 4, 2 tablets on day 5, 1 tablet on day 6, and stop on day 7   senna-docusate 8.6-50 MG tablet Commonly known as: Senokot-S Take 1 tablet by mouth at bedtime.   tiotropium 18 MCG inhalation capsule Commonly known as: SPIRIVA Place 1 capsule (18 mcg total) into inhaler and inhale daily.   triamcinolone lotion 0.1 % Commonly known as: KENALOG Apply 1 application topically 2 (two) times daily as needed (for rash).      Follow-up Information    Yates Decamp, MD. Call.   Specialty: Cardiology Why: To be seen in a week Contact information: [address] [place] [address] [phone]           Allergies  Allergen Reactions  . Contrast Media [Iodinated Diagnostic Agents] Anaphylaxis and Other (See Comments)    ** CARDIAC  ARREST **  . Peanuts [Peanut Oil] Shortness Of Breath and Swelling  . Shellfish Allergy Shortness Of Breath and Swelling  . Soap Shortness Of Breath and Swelling    LAUNDRY DETERGENT  . Latex Hives  . Eggs Or Egg-Derived Products Rash    Consultations:  None  Procedures/Studies: NM Pulmonary Perfusion  Result Date: 08/28/2019 CLINICAL DATA:  Shortness of breath EXAM: NUCLEAR MEDICINE PERFUSION LUNG SCAN TECHNIQUE: Perfusion images were obtained in multiple projections after intravenous injection of radiopharmaceutical. Ventilation scans intentionally deferred if perfusion scan and chest x-ray adequate for interpretation during COVID 19 epidemic. RADIOPHARMACEUTICALS:  1.5 mCi Tc-57m MAA IV COMPARISON:  Chest radiograph from 08/27/2019 FINDINGS: No perfusion defect is identified within either lung to suggest acute pulmonary embolus. Accordingly, ventilation was not performed. IMPRESSION: 1. No perfusion defect is identified to suggest acute pulmonary embolus. Normal perfusion lung scan. Electronically Signed   By: Gaylyn Rong M.D.   On: 08/28/2019 13:04   DG CHEST PORT 1 VIEW  Result Date: 08/30/2019 CLINICAL DATA:  Shortness of breath. EXAM: PORTABLE CHEST 1 VIEW COMPARISON:  One-view chest x-ray 08/29/2019 FINDINGS: Heart size is normal. Mild pulmonary vascular congestion is present without frank edema. Minimal atelectasis is present at both lung bases. IMPRESSION: 1. Mild pulmonary vascular congestion without frank edema. 2. Minimal bibasilar atelectasis. Electronically Signed   By: Marin Roberts M.D.   On: 08/30/2019 07:55   DG CHEST PORT 1 VIEW  Result Date: 08/29/2019 CLINICAL DATA:  Short of breath EXAM: PORTABLE CHEST 1 VIEW COMPARISON:  None. FINDINGS: Normal mediastinum and cardiac silhouette. Normal pulmonary vasculature. No evidence  of effusion, infiltrate, or pneumothorax. No acute bony abnormality. IMPRESSION: No acute cardiopulmonary process. Electronically Signed   By: Genevive Bi M.D.   On: 08/29/2019 06:47   DG Chest Portable 1 View  Result Date: 08/27/2019 CLINICAL DATA:  Dyspnea. Shortness of breath. Productive cough. EXAM: PORTABLE CHEST 1 VIEW COMPARISON:  06/03/2019 FINDINGS: There is increased attenuation at the lung bases favored to represent breast tissue attenuation artifact. There is a airspace opacity at the left costophrenic angle which appears to be new since the prior study. There is no pneumothorax. No large pleural effusion. There is no acute osseous abnormality. IMPRESSION: New airspace opacity at the left costophrenic angle suspicious for pneumonia or atelectasis. Electronically Signed   By: Katherine Mantle M.D.   On: 08/27/2019 20:25   VAS [name] LOWER EXTREMITY VENOUS (DVT)  Result Date: 08/29/2019  Lower Venous DVTStudy Indications: Elevated D-dimer and worsening SOB, and SOB.  Performing Technologist: Levada Schilling RDMS, RVT  Examination Guidelines: A complete evaluation includes B-mode imaging, spectral Doppler, color Doppler, and power Doppler as needed of all accessible portions of each vessel. Bilateral testing is considered an integral part of a complete examination. Limited examinations for reoccurring indications may be performed as noted. The reflux portion of the exam is performed with the patient in reverse Trendelenburg.  +---------+---------------+---------+-----------+----------+--------------+ RIGHT    CompressibilityPhasicitySpontaneityPropertiesThrombus Aging +---------+---------------+---------+-----------+----------+--------------+ CFV      Full           Yes      Yes                                 +---------+---------------+---------+-----------+----------+--------------+ SFJ      Full                                                         +---------+---------------+---------+-----------+----------+--------------+  FV Prox  Full                                                        +---------+---------------+---------+-----------+----------+--------------+ FV Mid   Full                                                        +---------+---------------+---------+-----------+----------+--------------+ FV DistalFull                                                        +---------+---------------+---------+-----------+----------+--------------+ PFV      Full                                                        +---------+---------------+---------+-----------+----------+--------------+ POP      Full           Yes      Yes                                 +---------+---------------+---------+-----------+----------+--------------+ PTV      Full                                                        +---------+---------------+---------+-----------+----------+--------------+ PERO     Full                                                        +---------+---------------+---------+-----------+----------+--------------+ GSV      Full                                                        +---------+---------------+---------+-----------+----------+--------------+   +---------+---------------+---------+-----------+----------+--------------+ LEFT     CompressibilityPhasicitySpontaneityPropertiesThrombus Aging +---------+---------------+---------+-----------+----------+--------------+ CFV      Full           Yes      Yes                                 +---------+---------------+---------+-----------+----------+--------------+ SFJ      Full                                                        +---------+---------------+---------+-----------+----------+--------------+  FV Prox  Full                                                         +---------+---------------+---------+-----------+----------+--------------+ FV Mid   Full                                                        +---------+---------------+---------+-----------+----------+--------------+ FV DistalFull                                                        +---------+---------------+---------+-----------+----------+--------------+ PFV      Full                                                        +---------+---------------+---------+-----------+----------+--------------+ POP      Full           Yes      Yes                                 +---------+---------------+---------+-----------+----------+--------------+ PTV      Full                                                        +---------+---------------+---------+-----------+----------+--------------+ PERO     Full                                                        +---------+---------------+---------+-----------+----------+--------------+ GSV      Full                                                        +---------+---------------+---------+-----------+----------+--------------+     Summary: RIGHT: - There is no evidence of deep vein thrombosis in the lower extremity.  - No cystic structure found in the popliteal fossa.  LEFT: - There is no evidence of deep vein thrombosis in the lower extremity.  - No cystic structure found in the popliteal fossa.  *See table(s) above for measurements and observations. Electronically signed by Waverly Ferrari MD on 08/29/2019 at 7:21:38 AM.    Final      Subjective: Seen and examined at bedside and she is doing fairly well.  Her breathing status was stable.  She was still wheezing somewhat but she does have COPD.  She felt almost 80 to 90% better.  She denies any lightheadedness or dizziness.  No chest pain today.  Had a little bit of leg swelling and will give another dose of IV Lasix.  She will be discharged home and will need to  follow-up with PCP in she will follow-up with cardiology Dr. Jacinto Halim within 1 week.  She understands agrees with plan of care.   Discharge Exam: Vitals:   08/30/19 0839 08/30/19 1342  BP: (!) 143/74 134/73  Pulse: 76 85  Resp: 18 18  Temp: 98.5 F (36.9 C) 98.1 F (36.7 C)  SpO2: 97% 99%   Vitals:   08/30/19 0500 08/30/19 0736 08/30/19 0839 08/30/19 1342  BP:   (!) 143/74 134/73  Pulse:   76 85  Resp:   18 18  Temp:   98.5 F (36.9 C) 98.1 F (36.7 C)  TempSrc:   Oral Oral  SpO2:  96% 97% 99%  Weight: 99.7 kg     Height:       General: Pt is alert, awake, not in acute distress Cardiovascular: RRR, S1/S2 +, no rubs, no gallops Respiratory: Diminished bilaterally with some wheezing and some slight crackles but she is wearing 2 L supplemental oxygen via nasal cannula and this is stable from her home baseline  Abdominal: Soft, NT, distended secondary body habitus, bowel sounds + Extremities: 1+ LE edema, no cyanosis  The results of significant diagnostics from this hospitalization (including imaging, microbiology, ancillary and laboratory) are listed below for reference.    Microbiology: Recent Results (from the past 240 hour(s))  Respiratory Panel by RT PCR (Flu A&B, Covid) - Nasopharyngeal Swab     Status: None   Collection Time: 08/27/19  8:16 PM   Specimen: Nasopharyngeal Swab  Result Value Ref Range Status   SARS Coronavirus 2 by RT PCR NEGATIVE NEGATIVE Final    Comment: (NOTE) SARS-CoV-2 target nucleic acids are NOT DETECTED. The SARS-CoV-2 RNA is generally detectable in upper respiratoy specimens during the acute phase of infection. The lowest concentration of SARS-CoV-2 viral copies this assay can detect is 131 copies/mL. A negative result does not preclude SARS-Cov-2 infection and should not be used as the sole basis for treatment or other patient management decisions. A negative result may occur with  improper specimen collection/handling, submission of specimen  other than nasopharyngeal swab, presence of viral mutation(s) within the areas targeted by this assay, and inadequate number of viral copies (<131 copies/mL). A negative result must be combined with clinical observations, patient history, and epidemiological information. The expected result is Negative. Fact Sheet for Patients:  https://www.moore.com/ Fact Sheet for Healthcare Providers:  https://www.young.biz/ This test is not yet ap proved or cleared by the [place] FDA and  has been authorized for detection and/or diagnosis of SARS-CoV-2 by FDA under an Emergency Use Authorization (EUA). This EUA will remain  in effect (meaning this test can be used) for the duration of the COVID-19 declaration under Section 564(b)(1) of the Act, 21 U.S.C. section 360bbb-3(b)(1), unless the authorization is terminated or revoked sooner.    Influenza A by PCR NEGATIVE NEGATIVE Final   Influenza B by PCR NEGATIVE NEGATIVE Final    Comment: (NOTE) The Xpert Xpress SARS-CoV-2/FLU/RSV assay is intended as an aid in  the diagnosis of influenza from Nasopharyngeal swab specimens and  should not be used as a sole basis for treatment. Nasal washings and  aspirates are unacceptable for Xpert Xpress SARS-CoV-2/FLU/RSV  testing. Fact Sheet for Patients: https://www.moore.com/ Fact  Sheet for Healthcare Providers: https://www.young.biz/ This test is not yet approved or cleared by the [place] and  has been authorized for detection and/or diagnosis of SARS-CoV-2 by  FDA under an Emergency Use Authorization (EUA). This EUA will remain  in effect (meaning this test can be used) for the duration of the  Covid-19 declaration under Section 564(b)(1) of the Act, 21  U.S.C. section 360bbb-3(b)(1), unless the authorization is  terminated or revoked. Performed at [facility], [address] [place], [place] [address]     Labs: BNP (last 3 results) Recent Labs    01/25/19 1246 08/27/19 1933  BNP 28.2 47.8   Basic Metabolic Panel: Recent Labs  Lab 08/27/19 1933 08/28/19 1112 08/29/19 0510 08/30/19 0453  NA 144 143 140 143  K 4.1 5.0 4.8 3.8  CL 103 103 102 100  CO2 32 31 28 32  GLUCOSE 100* 173* 149* 178*  BUN 6* 8 10 15   CREATININE 0.71 0.60 0.49 0.75  CALCIUM 9.4 10.0 10.2 9.9  MG  --  2.6* 2.1 1.8  PHOS  --  3.5 2.7 3.3   Liver Function Tests: Recent Labs  Lab 08/28/19 1112 08/29/19 0510 08/30/19 0453  AST 20 21 24   ALT 23 20 23   ALKPHOS 104 94 85  BILITOT 0.3 0.4 0.6  PROT 7.8 6.7 6.5  ALBUMIN 4.0 3.4* 3.3*   No results for input(s): LIPASE, AMYLASE in the last 168 hours. No results for input(s): AMMONIA in the last 168 hours. CBC: Recent Labs  Lab 08/27/19 1933 08/28/19 1112 08/29/19 0510 08/30/19 0453  WBC 9.7 15.8* 17.1* 18.1*  NEUTROABS 6.2 14.2* 15.1* 16.1*  HGB 12.6 12.3 11.7* 12.0  HCT 42.6 41.8 39.9 40.0  MCV 97.3 98.1 96.8 95.9  PLT 250 227 238 270   Cardiac Enzymes: No results for input(s): CKTOTAL, CKMB, CKMBINDEX, TROPONINI in the last 168 hours. BNP: Invalid input(s): POCBNP CBG: Recent Labs  Lab 08/29/19 2003 08/30/19 0723 08/30/19 1212  GLUCAP 211* 149* 196*   D-Dimer Recent Labs    08/28/19 0154  DDIMER 2.52*   Hgb A1c Recent Labs    08/29/19 0510 08/30/19 0453  HGBA1C 6.4* 6.5*   Lipid Profile No results for input(s): CHOL, HDL, LDLCALC, TRIG, CHOLHDL, LDLDIRECT in the last 72 hours. Thyroid function studies No results for input(s): TSH, T4TOTAL, T3FREE, THYROIDAB in the last 72 hours.  Invalid input(s): FREET3 Anemia work up Recent Labs    08/30/19 0453  VITAMINB12 210  FOLATE 6.6  FERRITIN 41  TIBC 349  IRON 54  RETICCTPCT 1.4   Urinalysis    Component Value Date/Time   COLORURINE YELLOW 01/25/2019 1858   APPEARANCEUR HAZY (A) 01/25/2019 1858   LABSPEC 1.026 01/25/2019 1858    PHURINE 5.0 01/25/2019 1858   GLUCOSEU NEGATIVE 01/25/2019 1858   HGBUR NEGATIVE 01/25/2019 1858   BILIRUBINUR NEGATIVE 01/25/2019 1858   KETONESUR NEGATIVE 01/25/2019 1858   PROTEINUR NEGATIVE 01/25/2019 1858   UROBILINOGEN 0.2 07/03/2014 1456   NITRITE NEGATIVE 01/25/2019 1858   LEUKOCYTESUR TRACE (A) 01/25/2019 1858   Sepsis Labs Invalid input(s): PROCALCITONIN,  WBC,  LACTICIDVEN Microbiology Recent Results (from the past 240 hour(s))  Respiratory Panel by RT PCR (Flu A&B, Covid) - Nasopharyngeal Swab     Status: None   Collection Time: 08/27/19  8:16 PM   Specimen: Nasopharyngeal Swab  Result Value Ref Range Status   SARS Coronavirus 2 by RT PCR NEGATIVE NEGATIVE Final  Comment: (NOTE) SARS-CoV-2 target nucleic acids are NOT DETECTED. The SARS-CoV-2 RNA is generally detectable in upper respiratoy specimens during the acute phase of infection. The lowest concentration of SARS-CoV-2 viral copies this assay can detect is 131 copies/mL. A negative result does not preclude SARS-Cov-2 infection and should not be used as the sole basis for treatment or other patient management decisions. A negative result may occur with  improper specimen collection/handling, submission of specimen other than nasopharyngeal swab, presence of viral mutation(s) within the areas targeted by this assay, and inadequate number of viral copies (<131 copies/mL). A negative result must be combined with clinical observations, patient history, and epidemiological information. The expected result is Negative. Fact Sheet for Patients:  https://www.moore.com/ Fact Sheet for Healthcare Providers:  https://www.young.biz/ This test is not yet ap proved or cleared by the [place] FDA and  has been authorized for detection and/or diagnosis of SARS-CoV-2 by FDA under an Emergency Use Authorization (EUA). This EUA will remain  in effect (meaning this test can be used) for  the duration of the COVID-19 declaration under Section 564(b)(1) of the Act, 21 U.S.C. section 360bbb-3(b)(1), unless the authorization is terminated or revoked sooner.    Influenza A by PCR NEGATIVE NEGATIVE Final   Influenza B by PCR NEGATIVE NEGATIVE Final    Comment: (NOTE) The Xpert Xpress SARS-CoV-2/FLU/RSV assay is intended as an aid in  the diagnosis of influenza from Nasopharyngeal swab specimens and  should not be used as a sole basis for treatment. Nasal washings and  aspirates are unacceptable for Xpert Xpress SARS-CoV-2/FLU/RSV  testing. Fact Sheet for Patients: https://www.moore.com/ Fact Sheet for Healthcare Providers: https://www.young.biz/ This test is not yet approved or cleared by the [place] FDA and  has been authorized for detection and/or diagnosis of SARS-CoV-2 by  FDA under an Emergency Use Authorization (EUA). This EUA will remain  in effect (meaning this test can be used) for the duration of the  Covid-19 declaration under Section 564(b)(1) of the Act, 21  U.S.C. section 360bbb-3(b)(1), unless the authorization is  terminated or revoked. Performed at [facility], [address], [place], [place] [address]    Time coordinating discharge: 35 minutes  SIGNED:  Merlene Laughter, DO [facility] 08/30/2019, 7:27 PM Pager is on AMION  If 7PM-7AM, please contact night-coverage [email or web address]

## 2019-08-31 ENCOUNTER — Telehealth: Payer: Self-pay

## 2019-08-31 NOTE — Telephone Encounter (Signed)
Location of hospitalization: [place] Reason for hospitalization: sob/CHF Date of discharge: 08/30/19 Date of first communication with patient: today Person contacting patient: Me Current symptoms: NA Do you understand why you were in the Hospital: Yes Questions regarding discharge instructions: None Where were you discharged to: Home Medications reviewed: Yes Allergies reviewed: Yes Dietary changes reviewed: Yes. Discussed low fat and low salt diet.  Referals reviewed: NA Activities of Daily Living: Able to with mild limitations Any transportation issues/concerns: None Any patient concerns: None Confirmed importance & date/time of Follow up appt: Yes Confirmed with patient if condition begins to worsen call. Pt was given the office number and encouraged to call back with questions or concerns: Yes

## 2019-09-07 NOTE — Progress Notes (Signed)
Patient referred by [facility] for suspected heart failure   Subjective:   Sonia Drake, female    DOB: [birth year], 64 y.o.   MRN: [id number]   Chief Complaint  Patient presents with  . Shortness of Breath    Hospital follow up  . New Patient (Initial Visit)     HPI  64 year old African-American female with morbid obesity, chronic hypoxic respiratory failure ( on oxygen at night), asthma. COPD, prior seizure disorder not on antiepileptics, history of contrast dye allergy leading to anaphylaxis and cardiac arrest, referred for evaluation of suspected heart failure.  Patient was hospitalized in early May with acute on chronic hypoxic respiratory failure.  Work-up with VQ scan and DVT was negative.  Chest x-ray showed new opacity which was thought less likely to be pneumonia in absence of other supporting evidence.  She was treated with bronchodilators, empirically treated with antibiotics and steroids with improvement in her symptoms.  Patient has had improvement in her shortness of breath and leg swelling since hospital discharge. Patient has had asthma since childhood. She is a lifetime nonsmoker. She has strong reaction to certain smells and food. At the time of her hospitalization, she had severe eye itching and irritation along with discharge, cough, and runny nose. She reports leg swelling, only when she eats salty food.  All symptoms are now resolved.   At baseline, she exercises for 30 min with a the help of a CNA (?home health), including walking. She denies chest pain, shortness of breath with this level of activity.  I independently reviewed data from hospitalization  Past Medical History:  Diagnosis Date  . Arthritis    "right knee; left hip" (04/07/2017)  . Asthma   . CHF (congestive heart failure) ([place])    reported treatment for fluid overload in the setting of asthma exacerbation ~ 1998  . COPD (chronic obstructive pulmonary disease) ([place])    patient  states she does not have COPD but was treated for it.   . 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 Dyspnea   . Dysrhythmia    occasional skipped beats  . GERD (gastroesophageal reflux disease)   . On home oxygen therapy    "I sleep with 2L" (04/07/2017)  . Pneumonia    "several times" (04/07/2017)  . Seizures ([place]) 2003 X 1   unknown cause     Past Surgical History:  Procedure Laterality Date  . JOINT REPLACEMENT    . KNEE ARTHROSCOPY Right 2017  . 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 Heart disease Mother   . Heart disease Father   . Diabetes Father   . Asthma Father   . Diabetes Brother   . Diabetes Sister   . Asthma Brother   . Asthma Sister   . Hypertension Sister      Current Outpatient Medications on File Prior to Visit  Medication Sig Dispense Refill  . albuterol (PROVENTIL HFA;VENTOLIN HFA) 108 (90 Base) MCG/ACT inhaler Inhale 2 puffs into the lungs every 6 (six)  hours as needed for wheezing or shortness of breath. 1 Inhaler 3  . albuterol (PROVENTIL) (2.5 MG/3ML) 0.083% nebulizer solution Take 3 mLs (2.5 mg total) by nebulization every 6 (six) hours as needed for wheezing or shortness of breath. 75 mL 1  . atorvastatin (LIPITOR) 10 MG tablet Take 10 mg by mouth daily.    . budesonide-formoterol (SYMBICORT) 160-4.5 MCG/ACT inhaler Inhale 2 puffs into the lungs 2 (two) times daily. 1 Inhaler 2  . calcium-vitamin D (OSCAL WITH D) 500-200 MG-UNIT tablet Take 1 tablet by mouth daily with breakfast. 30 tablet 3  . FLOVENT DISKUS 250 MCG/BLIST  AEPB Inhale 1 puff into the lungs in the morning and at bedtime.    . fluticasone (FLONASE) 50 MCG/ACT nasal spray Place 1 spray into both nostrils daily.    . furosemide (LASIX) 20 MG tablet Take 1 tablet (20 mg total) by mouth daily. 30 tablet 0  . ipratropium-albuterol (DUONEB) 0.5-2.5 (3) MG/3ML SOLN Inhale 3 mLs into the lungs every 6 (six) hours as needed (SOB, wheezing).     Marland Kitchen MAGNESIUM PO Take 1 tablet by mouth daily.    . montelukast (SINGULAIR) 10 MG tablet Take 1 tablet (10 mg total) by mouth daily. 30 tablet 2  . pantoprazole (PROTONIX) 20 MG tablet Take 20 mg by mouth daily as needed for indigestion.    . Potassium (POTASSIMIN PO) Take 1 tablet by mouth daily.    Marland Kitchen senna-docusate (SENOKOT-S) 8.6-50 MG tablet Take 1 tablet by mouth at bedtime. 30 tablet 0  . tiotropium (SPIRIVA) 18 MCG inhalation capsule Place 1 capsule (18 mcg total) into inhaler and inhale daily. 30 capsule 3  . triamcinolone lotion (KENALOG) 0.1 % Apply 1 application topically 2 (two) times daily as needed (for rash). 60 mL 2   No current facility-administered medications on file prior to visit.    Cardiovascular and other pertinent studies:  VQ scan 08/28/2019:  No perfusion defect is identified to suggest acute pulmonary embolus.  Normal perfusion lung scan.  EKG 08/27/2019: Sinus Tachycardia rate >99, Atrial premature complex  Vascular US DVT 08/28/2019:  No DVT  Echocardiogram 2016: EF 60-65%. Grade 2 DD   EKG 09/08/2019: Sinus rhythm 95 bpm. Incomplete RBBB. Nonspecific ST-T changes. Compared to previous EKG On 08/30/2019, heart rate slightly slower.   EKG 08/30/2019: Sinus tachycardia 100 bpm. Incomplete RBBB   Recent labs: 08/30/2019: Glucose 178, BUN/Cr 15/0.75. EGFR >60. Na/K 143/3.8. Rest of the CMP normal H/H 12/40. MCV 95.9. Platelets 270 HbA1C 6.5% BNP 47 normal   Review of Systems  Cardiovascular: Negative for chest pain, dyspnea on exertion, leg swelling, palpitations and  syncope.  Respiratory: Positive for shortness of breath (Now resolved).          Vitals:   09/08/19 0953  BP: 138/76  Pulse: 94  SpO2: 96%     Body mass index is 41.6 kg/m. Filed Weights   09/08/19 0953  Weight: 213 lb (96.6 kg)     Objective:   Physical Exam  Constitutional: No distress.  Neck: No JVD present.  Cardiovascular: Normal rate, regular rhythm and intact distal pulses.  Murmur heard.  Early systolic murmur is present with a grade of 2/6 at the upper right sternal border. Pulmonary/Chest: Effort normal and breath sounds normal. She has no wheezes. She has no rales.  Musculoskeletal:        General: No edema.  Nursing note and vitals reviewed.       Assessment & Recommendations:  64 year old African-American female with morbid obesity, chronic hypoxic respiratory failure, oxygen dependent, asthma, COPD, prior seizure disorder not on antiepileptics, history of contrast dye allergy leading to anaphylaxis and cardiac arrest, referred for evaluation of suspected heart failure.  Physical exam and recent blood work (BNP) not consistent with heart failure. Most of her symptoms seem to be related to asthma and pulmonary issues, possibly allergy induced asthma. I will obtain echocardiogram for baseline screening. Recommend continued follow up with PCP. She would like to see me for follow up in 6 months.   Thank you for referring the patient to [name]. Please feel free to contact with any questions.  Nigel Mormon, MD [facility] Pager: [phone] Office: [phone]

## 2019-09-08 ENCOUNTER — Ambulatory Visit: Payer: Medicare Other | Admitting: Cardiology

## 2019-09-08 ENCOUNTER — Telehealth: Payer: Self-pay

## 2019-09-08 ENCOUNTER — Encounter: Payer: Self-pay | Admitting: Cardiology

## 2019-09-08 ENCOUNTER — Other Ambulatory Visit: Payer: Self-pay

## 2019-09-08 VITALS — BP 138/76 | HR 94 | Ht 60.0 in | Wt 213.0 lb

## 2019-09-08 DIAGNOSIS — R0602 Shortness of breath: Secondary | ICD-10-CM

## 2019-09-08 NOTE — Telephone Encounter (Signed)
That's fine

## 2019-09-22 ENCOUNTER — Ambulatory Visit: Payer: Self-pay | Admitting: Cardiology

## 2019-09-23 ENCOUNTER — Other Ambulatory Visit: Payer: Self-pay | Admitting: Cardiology

## 2019-09-23 DIAGNOSIS — R0602 Shortness of breath: Secondary | ICD-10-CM

## 2019-09-24 ENCOUNTER — Ambulatory Visit: Payer: Medicare Other

## 2019-09-24 ENCOUNTER — Other Ambulatory Visit: Payer: Self-pay

## 2019-09-24 DIAGNOSIS — R0602 Shortness of breath: Secondary | ICD-10-CM

## 2019-09-28 ENCOUNTER — Telehealth: Payer: Self-pay

## 2019-09-28 NOTE — Telephone Encounter (Signed)
-----   Message from [facility], MD sent at 09/27/2019  9:18 AM EDT ----- Normal pumping function of the heart. No severe heart valve abnormalities noted. \  Thanks MJP

## 2019-09-28 NOTE — Telephone Encounter (Signed)
Unable to leave vm to cb.

## 2019-09-28 NOTE — Telephone Encounter (Signed)
Relayed information to patient. Patient voiced understanding.  

## 2019-12-25 DIAGNOSIS — G253 Myoclonus: Secondary | ICD-10-CM | POA: Diagnosis present

## 2020-01-21 DIAGNOSIS — G931 Anoxic brain damage, not elsewhere classified: Secondary | ICD-10-CM | POA: Diagnosis not present

## 2020-01-21 DIAGNOSIS — J9621 Acute and chronic respiratory failure with hypoxia: Secondary | ICD-10-CM

## 2020-01-21 DIAGNOSIS — I469 Cardiac arrest, cause unspecified: Secondary | ICD-10-CM

## 2020-01-21 DIAGNOSIS — J449 Chronic obstructive pulmonary disease, unspecified: Secondary | ICD-10-CM | POA: Diagnosis not present

## 2020-01-21 DIAGNOSIS — I5022 Chronic systolic (congestive) heart failure: Secondary | ICD-10-CM

## 2020-01-22 DIAGNOSIS — J9621 Acute and chronic respiratory failure with hypoxia: Secondary | ICD-10-CM | POA: Diagnosis not present

## 2020-01-22 DIAGNOSIS — J449 Chronic obstructive pulmonary disease, unspecified: Secondary | ICD-10-CM | POA: Diagnosis not present

## 2020-01-22 DIAGNOSIS — G931 Anoxic brain damage, not elsewhere classified: Secondary | ICD-10-CM

## 2020-01-22 DIAGNOSIS — I469 Cardiac arrest, cause unspecified: Secondary | ICD-10-CM

## 2020-01-22 DIAGNOSIS — I5022 Chronic systolic (congestive) heart failure: Secondary | ICD-10-CM

## 2020-01-31 DIAGNOSIS — I5020 Unspecified systolic (congestive) heart failure: Secondary | ICD-10-CM

## 2020-01-31 DIAGNOSIS — G8250 Quadriplegia, unspecified: Secondary | ICD-10-CM

## 2020-01-31 DIAGNOSIS — G894 Chronic pain syndrome: Secondary | ICD-10-CM | POA: Diagnosis not present

## 2020-01-31 DIAGNOSIS — J9621 Acute and chronic respiratory failure with hypoxia: Secondary | ICD-10-CM

## 2020-02-01 DIAGNOSIS — J9621 Acute and chronic respiratory failure with hypoxia: Secondary | ICD-10-CM | POA: Diagnosis not present

## 2020-02-01 DIAGNOSIS — G894 Chronic pain syndrome: Secondary | ICD-10-CM

## 2020-02-01 DIAGNOSIS — I5020 Unspecified systolic (congestive) heart failure: Secondary | ICD-10-CM

## 2020-02-01 DIAGNOSIS — G8250 Quadriplegia, unspecified: Secondary | ICD-10-CM

## 2020-02-02 DIAGNOSIS — G8250 Quadriplegia, unspecified: Secondary | ICD-10-CM | POA: Diagnosis not present

## 2020-02-02 DIAGNOSIS — G894 Chronic pain syndrome: Secondary | ICD-10-CM

## 2020-02-02 DIAGNOSIS — J9621 Acute and chronic respiratory failure with hypoxia: Secondary | ICD-10-CM

## 2020-02-02 DIAGNOSIS — I5020 Unspecified systolic (congestive) heart failure: Secondary | ICD-10-CM | POA: Diagnosis not present

## 2020-02-03 DIAGNOSIS — G8250 Quadriplegia, unspecified: Secondary | ICD-10-CM | POA: Diagnosis not present

## 2020-02-03 DIAGNOSIS — I5020 Unspecified systolic (congestive) heart failure: Secondary | ICD-10-CM | POA: Diagnosis not present

## 2020-02-03 DIAGNOSIS — J9621 Acute and chronic respiratory failure with hypoxia: Secondary | ICD-10-CM

## 2020-02-03 DIAGNOSIS — G894 Chronic pain syndrome: Secondary | ICD-10-CM | POA: Diagnosis not present

## 2020-02-04 DIAGNOSIS — J9621 Acute and chronic respiratory failure with hypoxia: Secondary | ICD-10-CM | POA: Diagnosis not present

## 2020-02-04 DIAGNOSIS — G894 Chronic pain syndrome: Secondary | ICD-10-CM

## 2020-02-04 DIAGNOSIS — I5020 Unspecified systolic (congestive) heart failure: Secondary | ICD-10-CM

## 2020-02-04 DIAGNOSIS — G8250 Quadriplegia, unspecified: Secondary | ICD-10-CM

## 2020-02-05 DIAGNOSIS — G894 Chronic pain syndrome: Secondary | ICD-10-CM

## 2020-02-05 DIAGNOSIS — I5020 Unspecified systolic (congestive) heart failure: Secondary | ICD-10-CM

## 2020-02-05 DIAGNOSIS — G8250 Quadriplegia, unspecified: Secondary | ICD-10-CM

## 2020-02-05 DIAGNOSIS — J9621 Acute and chronic respiratory failure with hypoxia: Secondary | ICD-10-CM | POA: Diagnosis not present

## 2020-02-06 DIAGNOSIS — G894 Chronic pain syndrome: Secondary | ICD-10-CM

## 2020-02-06 DIAGNOSIS — G8250 Quadriplegia, unspecified: Secondary | ICD-10-CM | POA: Diagnosis not present

## 2020-02-06 DIAGNOSIS — J9621 Acute and chronic respiratory failure with hypoxia: Secondary | ICD-10-CM

## 2020-02-06 DIAGNOSIS — I5020 Unspecified systolic (congestive) heart failure: Secondary | ICD-10-CM

## 2020-02-12 DIAGNOSIS — G894 Chronic pain syndrome: Secondary | ICD-10-CM | POA: Diagnosis not present

## 2020-02-12 DIAGNOSIS — J9621 Acute and chronic respiratory failure with hypoxia: Secondary | ICD-10-CM | POA: Diagnosis not present

## 2020-02-12 DIAGNOSIS — I5020 Unspecified systolic (congestive) heart failure: Secondary | ICD-10-CM | POA: Diagnosis not present

## 2020-02-12 DIAGNOSIS — G8250 Quadriplegia, unspecified: Secondary | ICD-10-CM

## 2020-02-13 DIAGNOSIS — G894 Chronic pain syndrome: Secondary | ICD-10-CM | POA: Diagnosis not present

## 2020-02-13 DIAGNOSIS — J9621 Acute and chronic respiratory failure with hypoxia: Secondary | ICD-10-CM | POA: Diagnosis not present

## 2020-02-13 DIAGNOSIS — G8250 Quadriplegia, unspecified: Secondary | ICD-10-CM | POA: Diagnosis not present

## 2020-02-13 DIAGNOSIS — I5020 Unspecified systolic (congestive) heart failure: Secondary | ICD-10-CM | POA: Diagnosis not present

## 2020-02-14 DIAGNOSIS — I5020 Unspecified systolic (congestive) heart failure: Secondary | ICD-10-CM

## 2020-02-14 DIAGNOSIS — G8250 Quadriplegia, unspecified: Secondary | ICD-10-CM | POA: Diagnosis not present

## 2020-02-14 DIAGNOSIS — G894 Chronic pain syndrome: Secondary | ICD-10-CM | POA: Diagnosis not present

## 2020-02-14 DIAGNOSIS — J9621 Acute and chronic respiratory failure with hypoxia: Secondary | ICD-10-CM | POA: Diagnosis not present

## 2020-02-15 DIAGNOSIS — J9621 Acute and chronic respiratory failure with hypoxia: Secondary | ICD-10-CM

## 2020-02-15 DIAGNOSIS — I5020 Unspecified systolic (congestive) heart failure: Secondary | ICD-10-CM

## 2020-02-15 DIAGNOSIS — G8250 Quadriplegia, unspecified: Secondary | ICD-10-CM

## 2020-02-15 DIAGNOSIS — G894 Chronic pain syndrome: Secondary | ICD-10-CM

## 2020-02-16 DIAGNOSIS — J9621 Acute and chronic respiratory failure with hypoxia: Secondary | ICD-10-CM

## 2020-02-16 DIAGNOSIS — I5020 Unspecified systolic (congestive) heart failure: Secondary | ICD-10-CM

## 2020-02-16 DIAGNOSIS — G894 Chronic pain syndrome: Secondary | ICD-10-CM

## 2020-02-16 DIAGNOSIS — G8250 Quadriplegia, unspecified: Secondary | ICD-10-CM

## 2020-02-17 DIAGNOSIS — G894 Chronic pain syndrome: Secondary | ICD-10-CM

## 2020-02-17 DIAGNOSIS — J9621 Acute and chronic respiratory failure with hypoxia: Secondary | ICD-10-CM

## 2020-02-17 DIAGNOSIS — I5020 Unspecified systolic (congestive) heart failure: Secondary | ICD-10-CM

## 2020-02-17 DIAGNOSIS — G8250 Quadriplegia, unspecified: Secondary | ICD-10-CM

## 2020-02-18 DIAGNOSIS — G894 Chronic pain syndrome: Secondary | ICD-10-CM

## 2020-02-18 DIAGNOSIS — I5020 Unspecified systolic (congestive) heart failure: Secondary | ICD-10-CM

## 2020-02-18 DIAGNOSIS — J9621 Acute and chronic respiratory failure with hypoxia: Secondary | ICD-10-CM

## 2020-02-18 DIAGNOSIS — G8250 Quadriplegia, unspecified: Secondary | ICD-10-CM

## 2020-02-21 ENCOUNTER — Other Ambulatory Visit
Admission: RE | Admit: 2020-02-21 | Discharge: 2020-02-21 | Disposition: A | Discharge status: Home / Self Care | Payer: Medicare Other | Source: Ambulatory Visit

## 2020-02-21 DIAGNOSIS — Z029 Encounter for administrative examinations, unspecified: Secondary | ICD-10-CM | POA: Diagnosis present

## 2020-02-21 LAB — CBC WITH DIFFERENTIAL/PLATELET
Abs Immature Granulocytes: 0.11 10*3/uL — ABNORMAL HIGH (ref 0.00–0.07)
Basophils Absolute: 0.1 10*3/uL (ref 0.0–0.1)
Basophils Relative: 0 %
Eosinophils Absolute: 0.2 10*3/uL (ref 0.0–0.5)
Eosinophils Relative: 1 %
HCT: 34.7 % — ABNORMAL LOW (ref 36.0–46.0)
Hemoglobin: 10.7 g/dL — ABNORMAL LOW (ref 12.0–15.0)
Immature Granulocytes: 1 %
Lymphocytes Relative: 11 %
Lymphs Abs: 2 10*3/uL (ref 0.7–4.0)
MCH: 29.2 pg (ref 26.0–34.0)
MCHC: 30.8 g/dL (ref 30.0–36.0)
MCV: 94.6 fL (ref 80.0–100.0)
Monocytes Absolute: 1.1 10*3/uL — ABNORMAL HIGH (ref 0.1–1.0)
Monocytes Relative: 6 %
Neutro Abs: 14.6 10*3/uL — ABNORMAL HIGH (ref 1.7–7.7)
Neutrophils Relative %: 81 %
Platelets: 359 10*3/uL (ref 150–400)
RBC: 3.67 MIL/uL — ABNORMAL LOW (ref 3.87–5.11)
RDW: 15 % (ref 11.5–15.5)
WBC: 18 10*3/uL — ABNORMAL HIGH (ref 4.0–10.5)
nRBC: 0 % (ref 0.0–0.2)

## 2020-02-21 LAB — COMPREHENSIVE METABOLIC PANEL
ALT: 146 U/L — ABNORMAL HIGH (ref 0–44)
AST: 50 U/L — ABNORMAL HIGH (ref 15–41)
Albumin: 3.2 g/dL — ABNORMAL LOW (ref 3.5–5.0)
Alkaline Phosphatase: 272 U/L — ABNORMAL HIGH (ref 38–126)
Anion gap: 18 — ABNORMAL HIGH (ref 5–15)
BUN: 39 mg/dL — ABNORMAL HIGH (ref 8–23)
CO2: 32 mmol/L (ref 22–32)
Calcium: 11.1 mg/dL — ABNORMAL HIGH (ref 8.9–10.3)
Chloride: 90 mmol/L — ABNORMAL LOW (ref 98–111)
Creatinine, Ser: 0.53 mg/dL (ref 0.44–1.00)
GFR, Estimated: >60 mL/min (ref >60)
Glucose, Bld: 415 mg/dL — ABNORMAL HIGH (ref 70–99)
Potassium: 5.2 mmol/L — ABNORMAL HIGH (ref 3.5–5.1)
Sodium: 140 mmol/L (ref 135–145)
Total Bilirubin: 0.6 mg/dL (ref 0.3–1.2)
Total Protein: 7.7 g/dL (ref 6.5–8.1)

## 2020-02-21 LAB — PROTIME-INR
INR: 1 (ref 0.8–1.2)
Prothrombin Time: 12.8 seconds (ref 11.4–15.2)

## 2020-02-21 LAB — APTT: aPTT: 29 seconds (ref 24–36)

## 2020-02-25 ENCOUNTER — Ambulatory Visit: Payer: Medicare Other | Admitting: Cardiology

## 2020-02-25 NOTE — Progress Notes (Deleted)
Patient referred by [facility] for suspected heart failure   Subjective:   Sonia Drake, female    DOB: [birth year], 64 y.o.   MRN: [id number]   No chief complaint on file.    HPI  64 year old African-American female with morbid obesity, chronic hypoxic respiratory failure ( on oxygen at night), asthma. COPD, prior seizure disorder not on antiepileptics, history of contrast dye allergy leading to anaphylaxis and cardiac arrest, referred for evaluation of suspected heart failure.  Patient was hospitalized in early May with acute on chronic hypoxic respiratory failure.  Work-up with VQ scan and DVT was negative.  Chest x-ray showed new opacity which was thought less likely to be pneumonia in absence of other supporting evidence.  She was treated with bronchodilators, empirically treated with antibiotics and steroids with improvement in her symptoms.  Patient has had improvement in her shortness of breath and leg swelling since hospital discharge. Patient has had asthma since childhood. She is a lifetime nonsmoker. She has strong reaction to certain smells and food. At the time of her hospitalization, she had severe eye itching and irritation along with discharge, cough, and runny nose. She reports leg swelling, only when she eats salty food.  All symptoms are now resolved.   At baseline, she exercises for 30 min with a the help of a CNA (?home health), including walking. She denies chest pain, shortness of breath with this level of activity.  I independently reviewed data from hospitalization  Past Medical History:  Diagnosis Date  . Arthritis    "right knee; left hip" (04/07/2017)  . Asthma   . CHF (congestive heart failure) ([place])    reported treatment for fluid overload in the setting of asthma exacerbation ~ 1998  . COPD (chronic obstructive pulmonary disease) ([place])    patient states she does not have COPD but was treated for it.   . Depression    "from chemical  imbalance" (04/07/2017)  . Dyspnea   . Dysrhythmia    occasional skipped beats  . GERD (gastroesophageal reflux disease)   . On home oxygen therapy    "I sleep with 2L" (04/07/2017)  . Pneumonia    "several times" (04/07/2017)  . Seizures ([place]) 2003 X 1   unknown cause     Past Surgical History:  Procedure Laterality Date  . JOINT REPLACEMENT    . KNEE ARTHROSCOPY Right 2017  . 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 Heart disease Mother   . Heart disease Father   . Diabetes Father   . Asthma Father   . Diabetes Brother   . Diabetes Sister   . Asthma Brother   . Asthma Sister   . Hypertension Sister   . Heart failure Brother   . Hypertension Brother   . Diabetes Brother   . Heart failure Brother   . Hypertension Brother   . Diabetes Brother   . Hypertension Brother   . Hyperlipidemia Brother      Current Outpatient Medications on File Prior to Visit  Medication  Sig Dispense Refill  . albuterol (PROVENTIL HFA;VENTOLIN HFA) 108 (90 Base) MCG/ACT inhaler Inhale 2 puffs into the lungs every 6 (six) hours as needed for wheezing or shortness of breath. 1 Inhaler 3  . albuterol (PROVENTIL) (2.5 MG/3ML) 0.083% nebulizer solution Take 3 mLs (2.5 mg total) by nebulization every 6 (six) hours as needed for wheezing or shortness of breath. 75 mL 1  . atorvastatin (LIPITOR) 10 MG tablet Take 10 mg by mouth daily.    . budesonide-formoterol (SYMBICORT) 160-4.5 MCG/ACT inhaler Inhale 2 puffs into the lungs 2 (two) times daily. 1 Inhaler 2  . calcium-vitamin D (OSCAL WITH D)  500-200 MG-UNIT tablet Take 1 tablet by mouth daily with breakfast. 30 tablet 3  . FLOVENT DISKUS 250 MCG/BLIST AEPB Inhale 1 puff into the lungs in the morning and at bedtime.    . fluticasone (FLONASE) 50 MCG/ACT nasal spray Place 1 spray into both nostrils daily.    . furosemide (LASIX) 20 MG tablet Take 1 tablet (20 mg total) by mouth daily. 30 tablet 0  . ipratropium-albuterol (DUONEB) 0.5-2.5 (3) MG/3ML SOLN Inhale 3 mLs into the lungs every 6 (six) hours as needed (SOB, wheezing).     Marland Kitchen MAGNESIUM PO Take 1 tablet by mouth daily.    . montelukast (SINGULAIR) 10 MG tablet Take 1 tablet (10 mg total) by mouth daily. 30 tablet 2  . pantoprazole (PROTONIX) 20 MG tablet Take 20 mg by mouth daily as needed for indigestion.    . Potassium (POTASSIMIN PO) Take 1 tablet by mouth daily.    Marland Kitchen senna-docusate (SENOKOT-S) 8.6-50 MG tablet Take 1 tablet by mouth at bedtime. 30 tablet 0  . tiotropium (SPIRIVA) 18 MCG inhalation capsule Place 1 capsule (18 mcg total) into inhaler and inhale daily. 30 capsule 3  . triamcinolone lotion (KENALOG) 0.1 % Apply 1 application topically 2 (two) times daily as needed (for rash). 60 mL 2   No current facility-administered medications on file prior to visit.    Cardiovascular and other pertinent studies:  Echocardiogram 09/24/2019:  Left ventricle cavity is normal in size. Moderate concentric hypertrophy  of the left ventricle. Normal global wall motion. Normal LV systolic  function with EF 60%. Normal diastolic filling pattern.  Trileaflet aortic valve with no regurgitation. Increased LVOT and  transvalvular velocity due to moderate LVH and hyperdynamic LV. No  valvular stenosis noted.  Normal right atrial pressure.   VQ scan 08/28/2019:  No perfusion defect is identified to suggest acute pulmonary embolus.  Normal perfusion lung scan.  EKG 08/27/2019: Sinus Tachycardia rate >99, Atrial premature complex  Vascular US DVT 08/28/2019:  No  DVT  Echocardiogram 2016: EF 60-65%. Grade 2 DD   EKG 09/08/2019: Sinus rhythm 95 bpm. Incomplete RBBB. Nonspecific ST-T changes. Compared to previous EKG On 08/30/2019, heart rate slightly slower.   EKG 08/30/2019: Sinus tachycardia 100 bpm. Incomplete RBBB   Recent labs: 08/30/2019: Glucose 178, BUN/Cr 15/0.75. EGFR >60. Na/K 143/3.8. Rest of the CMP normal H/H 12/40. MCV 95.9. Platelets 270 HbA1C 6.5% BNP 47 normal   Review of Systems  Cardiovascular: Negative for chest pain, dyspnea on exertion, leg swelling, palpitations and syncope.  Respiratory: Positive for shortness of breath (Now resolved).         *** There were no vitals filed for this visit.  *** There is no height or weight on file to calculate BMI. There were no vitals filed for this visit.   Objective:   Physical Exam Vitals and nursing  note reviewed.  Constitutional:      General: She is not in acute distress. Neck:     Vascular: No JVD.  Cardiovascular:     Rate and Rhythm: Normal rate and regular rhythm.     Pulses: Intact distal pulses.     Heart sounds: Murmur heard.  Early systolic murmur is present with a grade of 2/6 at the upper right sternal border.   Pulmonary:     Effort: Pulmonary effort is normal.     Breath sounds: Normal breath sounds. No wheezing or rales.         Assessment & Recommendations:   64 year old African-American female with morbid obesity, chronic hypoxic respiratory failure, oxygen dependent, asthma, COPD, prior seizure disorder not on antiepileptics, history of contrast dye allergy leading to anaphylaxis and cardiac arrest, referred for evaluation of suspected heart failure.  Physical exam and recent blood work (BNP) not consistent with heart failure. Most of her symptoms seem to be related to asthma and pulmonary issues, possibly allergy induced asthma. I will obtain echocardiogram for baseline screening. Recommend continued follow up with PCP. She would like  to see me for follow up in 6 months.   Thank you for referring the patient to [name]. Please feel free to contact with any questions.  Nigel Mormon, MD [facility] Pager: [phone] Office: [phone]

## 2020-02-28 DIAGNOSIS — J9621 Acute and chronic respiratory failure with hypoxia: Secondary | ICD-10-CM

## 2020-02-28 DIAGNOSIS — G894 Chronic pain syndrome: Secondary | ICD-10-CM

## 2020-02-28 DIAGNOSIS — G8250 Quadriplegia, unspecified: Secondary | ICD-10-CM

## 2020-02-28 DIAGNOSIS — I5020 Unspecified systolic (congestive) heart failure: Secondary | ICD-10-CM

## 2020-02-29 DIAGNOSIS — I5020 Unspecified systolic (congestive) heart failure: Secondary | ICD-10-CM

## 2020-02-29 DIAGNOSIS — J9621 Acute and chronic respiratory failure with hypoxia: Secondary | ICD-10-CM

## 2020-02-29 DIAGNOSIS — G8250 Quadriplegia, unspecified: Secondary | ICD-10-CM

## 2020-02-29 DIAGNOSIS — G894 Chronic pain syndrome: Secondary | ICD-10-CM

## 2020-03-01 DIAGNOSIS — I5020 Unspecified systolic (congestive) heart failure: Secondary | ICD-10-CM

## 2020-03-01 DIAGNOSIS — G8250 Quadriplegia, unspecified: Secondary | ICD-10-CM

## 2020-03-01 DIAGNOSIS — J9621 Acute and chronic respiratory failure with hypoxia: Secondary | ICD-10-CM

## 2020-03-01 DIAGNOSIS — G894 Chronic pain syndrome: Secondary | ICD-10-CM

## 2020-03-02 DIAGNOSIS — G8250 Quadriplegia, unspecified: Secondary | ICD-10-CM

## 2020-03-02 DIAGNOSIS — G894 Chronic pain syndrome: Secondary | ICD-10-CM

## 2020-03-02 DIAGNOSIS — I5020 Unspecified systolic (congestive) heart failure: Secondary | ICD-10-CM

## 2020-03-02 DIAGNOSIS — J9621 Acute and chronic respiratory failure with hypoxia: Secondary | ICD-10-CM

## 2020-03-03 DIAGNOSIS — G894 Chronic pain syndrome: Secondary | ICD-10-CM

## 2020-03-03 DIAGNOSIS — G8250 Quadriplegia, unspecified: Secondary | ICD-10-CM

## 2020-03-03 DIAGNOSIS — J9621 Acute and chronic respiratory failure with hypoxia: Secondary | ICD-10-CM

## 2020-03-03 DIAGNOSIS — I5020 Unspecified systolic (congestive) heart failure: Secondary | ICD-10-CM

## 2020-03-04 DIAGNOSIS — J9621 Acute and chronic respiratory failure with hypoxia: Secondary | ICD-10-CM

## 2020-03-04 DIAGNOSIS — G8250 Quadriplegia, unspecified: Secondary | ICD-10-CM

## 2020-03-04 DIAGNOSIS — G894 Chronic pain syndrome: Secondary | ICD-10-CM

## 2020-03-04 DIAGNOSIS — I5020 Unspecified systolic (congestive) heart failure: Secondary | ICD-10-CM

## 2020-03-05 DIAGNOSIS — G894 Chronic pain syndrome: Secondary | ICD-10-CM

## 2020-03-05 DIAGNOSIS — I5020 Unspecified systolic (congestive) heart failure: Secondary | ICD-10-CM

## 2020-03-05 DIAGNOSIS — J9621 Acute and chronic respiratory failure with hypoxia: Secondary | ICD-10-CM

## 2020-03-05 DIAGNOSIS — G8250 Quadriplegia, unspecified: Secondary | ICD-10-CM

## 2020-03-13 DIAGNOSIS — G894 Chronic pain syndrome: Secondary | ICD-10-CM | POA: Diagnosis not present

## 2020-03-13 DIAGNOSIS — J9621 Acute and chronic respiratory failure with hypoxia: Secondary | ICD-10-CM | POA: Diagnosis not present

## 2020-03-13 DIAGNOSIS — I5020 Unspecified systolic (congestive) heart failure: Secondary | ICD-10-CM | POA: Diagnosis not present

## 2020-03-13 DIAGNOSIS — G8250 Quadriplegia, unspecified: Secondary | ICD-10-CM | POA: Diagnosis not present

## 2020-03-14 DIAGNOSIS — J9621 Acute and chronic respiratory failure with hypoxia: Secondary | ICD-10-CM | POA: Diagnosis not present

## 2020-03-14 DIAGNOSIS — I5020 Unspecified systolic (congestive) heart failure: Secondary | ICD-10-CM | POA: Diagnosis not present

## 2020-03-14 DIAGNOSIS — G8250 Quadriplegia, unspecified: Secondary | ICD-10-CM | POA: Diagnosis not present

## 2020-03-14 DIAGNOSIS — G894 Chronic pain syndrome: Secondary | ICD-10-CM | POA: Diagnosis not present

## 2020-03-15 DIAGNOSIS — G894 Chronic pain syndrome: Secondary | ICD-10-CM | POA: Diagnosis not present

## 2020-03-15 DIAGNOSIS — G8250 Quadriplegia, unspecified: Secondary | ICD-10-CM | POA: Diagnosis not present

## 2020-03-15 DIAGNOSIS — J9621 Acute and chronic respiratory failure with hypoxia: Secondary | ICD-10-CM | POA: Diagnosis not present

## 2020-03-15 DIAGNOSIS — I5020 Unspecified systolic (congestive) heart failure: Secondary | ICD-10-CM | POA: Diagnosis not present

## 2020-03-16 DIAGNOSIS — J9621 Acute and chronic respiratory failure with hypoxia: Secondary | ICD-10-CM | POA: Diagnosis not present

## 2020-03-16 DIAGNOSIS — I5020 Unspecified systolic (congestive) heart failure: Secondary | ICD-10-CM | POA: Diagnosis not present

## 2020-03-16 DIAGNOSIS — G8250 Quadriplegia, unspecified: Secondary | ICD-10-CM | POA: Diagnosis not present

## 2020-03-16 DIAGNOSIS — G894 Chronic pain syndrome: Secondary | ICD-10-CM | POA: Diagnosis not present

## 2020-03-17 DIAGNOSIS — I5020 Unspecified systolic (congestive) heart failure: Secondary | ICD-10-CM | POA: Diagnosis not present

## 2020-03-17 DIAGNOSIS — G894 Chronic pain syndrome: Secondary | ICD-10-CM | POA: Diagnosis not present

## 2020-03-17 DIAGNOSIS — G8250 Quadriplegia, unspecified: Secondary | ICD-10-CM | POA: Diagnosis not present

## 2020-03-17 DIAGNOSIS — J9621 Acute and chronic respiratory failure with hypoxia: Secondary | ICD-10-CM | POA: Diagnosis not present

## 2020-03-18 DIAGNOSIS — I5020 Unspecified systolic (congestive) heart failure: Secondary | ICD-10-CM | POA: Diagnosis not present

## 2020-03-18 DIAGNOSIS — G894 Chronic pain syndrome: Secondary | ICD-10-CM | POA: Diagnosis not present

## 2020-03-18 DIAGNOSIS — J9621 Acute and chronic respiratory failure with hypoxia: Secondary | ICD-10-CM | POA: Diagnosis not present

## 2020-03-18 DIAGNOSIS — G8250 Quadriplegia, unspecified: Secondary | ICD-10-CM | POA: Diagnosis not present

## 2020-03-19 DIAGNOSIS — G8250 Quadriplegia, unspecified: Secondary | ICD-10-CM | POA: Diagnosis not present

## 2020-03-19 DIAGNOSIS — J9621 Acute and chronic respiratory failure with hypoxia: Secondary | ICD-10-CM | POA: Diagnosis not present

## 2020-03-19 DIAGNOSIS — I5020 Unspecified systolic (congestive) heart failure: Secondary | ICD-10-CM | POA: Diagnosis not present

## 2020-03-19 DIAGNOSIS — G894 Chronic pain syndrome: Secondary | ICD-10-CM | POA: Diagnosis not present

## 2020-03-27 DIAGNOSIS — G8250 Quadriplegia, unspecified: Secondary | ICD-10-CM | POA: Diagnosis not present

## 2020-03-27 DIAGNOSIS — I5020 Unspecified systolic (congestive) heart failure: Secondary | ICD-10-CM | POA: Diagnosis not present

## 2020-03-27 DIAGNOSIS — J9621 Acute and chronic respiratory failure with hypoxia: Secondary | ICD-10-CM

## 2020-03-27 DIAGNOSIS — G894 Chronic pain syndrome: Secondary | ICD-10-CM

## 2020-03-28 ENCOUNTER — Other Ambulatory Visit (HOSPITAL_COMMUNITY): Payer: Self-pay

## 2020-03-28 DIAGNOSIS — G8250 Quadriplegia, unspecified: Secondary | ICD-10-CM

## 2020-03-28 DIAGNOSIS — G894 Chronic pain syndrome: Secondary | ICD-10-CM

## 2020-03-28 DIAGNOSIS — I5020 Unspecified systolic (congestive) heart failure: Secondary | ICD-10-CM | POA: Diagnosis not present

## 2020-03-28 DIAGNOSIS — J9621 Acute and chronic respiratory failure with hypoxia: Secondary | ICD-10-CM | POA: Diagnosis not present

## 2020-03-28 DIAGNOSIS — I38 Endocarditis, valve unspecified: Secondary | ICD-10-CM

## 2020-03-29 DIAGNOSIS — J9621 Acute and chronic respiratory failure with hypoxia: Secondary | ICD-10-CM

## 2020-03-29 DIAGNOSIS — G8250 Quadriplegia, unspecified: Secondary | ICD-10-CM

## 2020-03-29 DIAGNOSIS — I5020 Unspecified systolic (congestive) heart failure: Secondary | ICD-10-CM | POA: Diagnosis not present

## 2020-03-29 DIAGNOSIS — G894 Chronic pain syndrome: Secondary | ICD-10-CM

## 2020-03-30 DIAGNOSIS — I5020 Unspecified systolic (congestive) heart failure: Secondary | ICD-10-CM | POA: Diagnosis not present

## 2020-03-30 DIAGNOSIS — G894 Chronic pain syndrome: Secondary | ICD-10-CM | POA: Diagnosis not present

## 2020-03-30 DIAGNOSIS — J9621 Acute and chronic respiratory failure with hypoxia: Secondary | ICD-10-CM | POA: Diagnosis not present

## 2020-03-30 DIAGNOSIS — G8250 Quadriplegia, unspecified: Secondary | ICD-10-CM

## 2020-03-31 DIAGNOSIS — I5020 Unspecified systolic (congestive) heart failure: Secondary | ICD-10-CM

## 2020-03-31 DIAGNOSIS — G8250 Quadriplegia, unspecified: Secondary | ICD-10-CM | POA: Diagnosis not present

## 2020-03-31 DIAGNOSIS — J9621 Acute and chronic respiratory failure with hypoxia: Secondary | ICD-10-CM | POA: Diagnosis not present

## 2020-03-31 DIAGNOSIS — G894 Chronic pain syndrome: Secondary | ICD-10-CM

## 2020-04-01 DIAGNOSIS — I5020 Unspecified systolic (congestive) heart failure: Secondary | ICD-10-CM | POA: Diagnosis not present

## 2020-04-01 DIAGNOSIS — G8250 Quadriplegia, unspecified: Secondary | ICD-10-CM

## 2020-04-01 DIAGNOSIS — J9621 Acute and chronic respiratory failure with hypoxia: Secondary | ICD-10-CM

## 2020-04-01 DIAGNOSIS — G894 Chronic pain syndrome: Secondary | ICD-10-CM

## 2020-04-02 DIAGNOSIS — J9621 Acute and chronic respiratory failure with hypoxia: Secondary | ICD-10-CM

## 2020-04-02 DIAGNOSIS — G894 Chronic pain syndrome: Secondary | ICD-10-CM | POA: Diagnosis not present

## 2020-04-02 DIAGNOSIS — G8250 Quadriplegia, unspecified: Secondary | ICD-10-CM

## 2020-04-02 DIAGNOSIS — I5020 Unspecified systolic (congestive) heart failure: Secondary | ICD-10-CM

## 2020-04-04 ENCOUNTER — Ambulatory Visit (HOSPITAL_COMMUNITY)
Admission: RE | Admit: 2020-04-04 | Discharge: 2020-04-04 | Disposition: A | Discharge status: Home / Self Care | Payer: Medicare Other | Source: Ambulatory Visit | Attending: Internal Medicine | Admitting: Internal Medicine

## 2020-04-04 ENCOUNTER — Other Ambulatory Visit: Payer: Self-pay

## 2020-04-04 DIAGNOSIS — J9690 Respiratory failure, unspecified, unspecified whether with hypoxia or hypercapnia: Secondary | ICD-10-CM | POA: Insufficient documentation

## 2020-04-04 DIAGNOSIS — R7881 Bacteremia: Secondary | ICD-10-CM | POA: Insufficient documentation

## 2020-04-04 DIAGNOSIS — Z930 Tracheostomy status: Secondary | ICD-10-CM | POA: Insufficient documentation

## 2020-04-04 DIAGNOSIS — R4182 Altered mental status, unspecified: Secondary | ICD-10-CM | POA: Insufficient documentation

## 2020-04-04 DIAGNOSIS — I38 Endocarditis, valve unspecified: Secondary | ICD-10-CM | POA: Diagnosis present

## 2020-04-04 DIAGNOSIS — I313 Pericardial effusion (noninflammatory): Secondary | ICD-10-CM | POA: Insufficient documentation

## 2020-04-04 DIAGNOSIS — J449 Chronic obstructive pulmonary disease, unspecified: Secondary | ICD-10-CM | POA: Diagnosis not present

## 2020-04-04 DIAGNOSIS — J189 Pneumonia, unspecified organism: Secondary | ICD-10-CM | POA: Diagnosis not present

## 2020-04-04 DIAGNOSIS — I509 Heart failure, unspecified: Secondary | ICD-10-CM | POA: Diagnosis not present

## 2020-04-04 LAB — ECHOCARDIOGRAM COMPLETE
AR max vel: 1.68 cm2
AV Peak grad: 14.1 mmHg
Ao pk vel: 1.88 m/s
Area-P 1/2: 6.17 cm2
S' Lateral: 1.9 cm

## 2020-04-04 NOTE — Progress Notes (Signed)
  Echocardiogram 2D Echocardiogram has been performed.  Sonia Drake 04/04/2020, 10:21 AM

## 2020-04-10 DIAGNOSIS — I5020 Unspecified systolic (congestive) heart failure: Secondary | ICD-10-CM | POA: Diagnosis not present

## 2020-04-10 DIAGNOSIS — G8250 Quadriplegia, unspecified: Secondary | ICD-10-CM | POA: Diagnosis not present

## 2020-04-10 DIAGNOSIS — G894 Chronic pain syndrome: Secondary | ICD-10-CM | POA: Diagnosis not present

## 2020-04-10 DIAGNOSIS — J9621 Acute and chronic respiratory failure with hypoxia: Secondary | ICD-10-CM | POA: Diagnosis not present

## 2020-04-11 DIAGNOSIS — J9621 Acute and chronic respiratory failure with hypoxia: Secondary | ICD-10-CM | POA: Diagnosis not present

## 2020-04-11 DIAGNOSIS — I5020 Unspecified systolic (congestive) heart failure: Secondary | ICD-10-CM | POA: Diagnosis not present

## 2020-04-11 DIAGNOSIS — G8250 Quadriplegia, unspecified: Secondary | ICD-10-CM | POA: Diagnosis not present

## 2020-04-11 DIAGNOSIS — G894 Chronic pain syndrome: Secondary | ICD-10-CM | POA: Diagnosis not present

## 2020-04-12 DIAGNOSIS — G8250 Quadriplegia, unspecified: Secondary | ICD-10-CM | POA: Diagnosis not present

## 2020-04-12 DIAGNOSIS — I5020 Unspecified systolic (congestive) heart failure: Secondary | ICD-10-CM | POA: Diagnosis not present

## 2020-04-12 DIAGNOSIS — G894 Chronic pain syndrome: Secondary | ICD-10-CM | POA: Diagnosis not present

## 2020-04-12 DIAGNOSIS — J9621 Acute and chronic respiratory failure with hypoxia: Secondary | ICD-10-CM | POA: Diagnosis not present

## 2020-04-13 DIAGNOSIS — G894 Chronic pain syndrome: Secondary | ICD-10-CM | POA: Diagnosis not present

## 2020-04-13 DIAGNOSIS — G8250 Quadriplegia, unspecified: Secondary | ICD-10-CM | POA: Diagnosis not present

## 2020-04-13 DIAGNOSIS — J9621 Acute and chronic respiratory failure with hypoxia: Secondary | ICD-10-CM | POA: Diagnosis not present

## 2020-04-13 DIAGNOSIS — I5020 Unspecified systolic (congestive) heart failure: Secondary | ICD-10-CM | POA: Diagnosis not present

## 2020-04-14 DIAGNOSIS — G8250 Quadriplegia, unspecified: Secondary | ICD-10-CM | POA: Diagnosis not present

## 2020-04-14 DIAGNOSIS — J9621 Acute and chronic respiratory failure with hypoxia: Secondary | ICD-10-CM | POA: Diagnosis not present

## 2020-04-14 DIAGNOSIS — G894 Chronic pain syndrome: Secondary | ICD-10-CM | POA: Diagnosis not present

## 2020-04-14 DIAGNOSIS — I5020 Unspecified systolic (congestive) heart failure: Secondary | ICD-10-CM | POA: Diagnosis not present

## 2020-04-15 DIAGNOSIS — I5020 Unspecified systolic (congestive) heart failure: Secondary | ICD-10-CM | POA: Diagnosis not present

## 2020-04-15 DIAGNOSIS — G8250 Quadriplegia, unspecified: Secondary | ICD-10-CM | POA: Diagnosis not present

## 2020-04-15 DIAGNOSIS — J9621 Acute and chronic respiratory failure with hypoxia: Secondary | ICD-10-CM | POA: Diagnosis not present

## 2020-04-15 DIAGNOSIS — G894 Chronic pain syndrome: Secondary | ICD-10-CM | POA: Diagnosis not present

## 2020-04-16 DIAGNOSIS — G8250 Quadriplegia, unspecified: Secondary | ICD-10-CM | POA: Diagnosis not present

## 2020-04-16 DIAGNOSIS — I5020 Unspecified systolic (congestive) heart failure: Secondary | ICD-10-CM | POA: Diagnosis not present

## 2020-04-16 DIAGNOSIS — G894 Chronic pain syndrome: Secondary | ICD-10-CM | POA: Diagnosis not present

## 2020-04-16 DIAGNOSIS — J9621 Acute and chronic respiratory failure with hypoxia: Secondary | ICD-10-CM | POA: Diagnosis not present

## 2020-04-24 DIAGNOSIS — I5020 Unspecified systolic (congestive) heart failure: Secondary | ICD-10-CM | POA: Diagnosis not present

## 2020-04-24 DIAGNOSIS — G894 Chronic pain syndrome: Secondary | ICD-10-CM | POA: Diagnosis not present

## 2020-04-24 DIAGNOSIS — J9621 Acute and chronic respiratory failure with hypoxia: Secondary | ICD-10-CM | POA: Diagnosis not present

## 2020-04-24 DIAGNOSIS — G8250 Quadriplegia, unspecified: Secondary | ICD-10-CM | POA: Diagnosis not present

## 2020-04-25 DIAGNOSIS — G894 Chronic pain syndrome: Secondary | ICD-10-CM | POA: Diagnosis not present

## 2020-04-25 DIAGNOSIS — G8250 Quadriplegia, unspecified: Secondary | ICD-10-CM | POA: Diagnosis not present

## 2020-04-25 DIAGNOSIS — I5020 Unspecified systolic (congestive) heart failure: Secondary | ICD-10-CM | POA: Diagnosis not present

## 2020-04-25 DIAGNOSIS — J9621 Acute and chronic respiratory failure with hypoxia: Secondary | ICD-10-CM | POA: Diagnosis not present

## 2020-04-26 DIAGNOSIS — G894 Chronic pain syndrome: Secondary | ICD-10-CM | POA: Diagnosis not present

## 2020-04-26 DIAGNOSIS — G8250 Quadriplegia, unspecified: Secondary | ICD-10-CM | POA: Diagnosis not present

## 2020-04-26 DIAGNOSIS — J9621 Acute and chronic respiratory failure with hypoxia: Secondary | ICD-10-CM | POA: Diagnosis not present

## 2020-04-26 DIAGNOSIS — I5020 Unspecified systolic (congestive) heart failure: Secondary | ICD-10-CM | POA: Diagnosis not present

## 2020-04-27 DIAGNOSIS — I5020 Unspecified systolic (congestive) heart failure: Secondary | ICD-10-CM | POA: Diagnosis not present

## 2020-04-27 DIAGNOSIS — G894 Chronic pain syndrome: Secondary | ICD-10-CM

## 2020-04-27 DIAGNOSIS — G8250 Quadriplegia, unspecified: Secondary | ICD-10-CM | POA: Diagnosis not present

## 2020-04-27 DIAGNOSIS — J9621 Acute and chronic respiratory failure with hypoxia: Secondary | ICD-10-CM | POA: Diagnosis not present

## 2020-04-28 DIAGNOSIS — G894 Chronic pain syndrome: Secondary | ICD-10-CM

## 2020-04-28 DIAGNOSIS — G8250 Quadriplegia, unspecified: Secondary | ICD-10-CM | POA: Diagnosis not present

## 2020-04-28 DIAGNOSIS — I5020 Unspecified systolic (congestive) heart failure: Secondary | ICD-10-CM

## 2020-04-28 DIAGNOSIS — J9621 Acute and chronic respiratory failure with hypoxia: Secondary | ICD-10-CM | POA: Diagnosis not present

## 2020-04-29 DIAGNOSIS — I5020 Unspecified systolic (congestive) heart failure: Secondary | ICD-10-CM | POA: Diagnosis not present

## 2020-04-29 DIAGNOSIS — G8250 Quadriplegia, unspecified: Secondary | ICD-10-CM | POA: Diagnosis not present

## 2020-04-29 DIAGNOSIS — G894 Chronic pain syndrome: Secondary | ICD-10-CM | POA: Diagnosis not present

## 2020-04-29 DIAGNOSIS — J9621 Acute and chronic respiratory failure with hypoxia: Secondary | ICD-10-CM | POA: Diagnosis not present

## 2020-04-30 DIAGNOSIS — I5020 Unspecified systolic (congestive) heart failure: Secondary | ICD-10-CM

## 2020-04-30 DIAGNOSIS — G8250 Quadriplegia, unspecified: Secondary | ICD-10-CM | POA: Diagnosis not present

## 2020-04-30 DIAGNOSIS — G894 Chronic pain syndrome: Secondary | ICD-10-CM | POA: Diagnosis not present

## 2020-04-30 DIAGNOSIS — J9621 Acute and chronic respiratory failure with hypoxia: Secondary | ICD-10-CM | POA: Diagnosis not present

## 2020-05-08 DIAGNOSIS — G8250 Quadriplegia, unspecified: Secondary | ICD-10-CM | POA: Diagnosis not present

## 2020-05-08 DIAGNOSIS — J9621 Acute and chronic respiratory failure with hypoxia: Secondary | ICD-10-CM | POA: Diagnosis not present

## 2020-05-08 DIAGNOSIS — G894 Chronic pain syndrome: Secondary | ICD-10-CM | POA: Diagnosis not present

## 2020-05-08 DIAGNOSIS — I5020 Unspecified systolic (congestive) heart failure: Secondary | ICD-10-CM | POA: Diagnosis not present

## 2020-05-09 DIAGNOSIS — J9621 Acute and chronic respiratory failure with hypoxia: Secondary | ICD-10-CM

## 2020-05-09 DIAGNOSIS — G894 Chronic pain syndrome: Secondary | ICD-10-CM

## 2020-05-09 DIAGNOSIS — I5020 Unspecified systolic (congestive) heart failure: Secondary | ICD-10-CM

## 2020-05-09 DIAGNOSIS — G8250 Quadriplegia, unspecified: Secondary | ICD-10-CM | POA: Diagnosis not present

## 2020-05-10 DIAGNOSIS — I5020 Unspecified systolic (congestive) heart failure: Secondary | ICD-10-CM

## 2020-05-10 DIAGNOSIS — J9621 Acute and chronic respiratory failure with hypoxia: Secondary | ICD-10-CM | POA: Diagnosis not present

## 2020-05-10 DIAGNOSIS — G8250 Quadriplegia, unspecified: Secondary | ICD-10-CM

## 2020-05-10 DIAGNOSIS — G894 Chronic pain syndrome: Secondary | ICD-10-CM

## 2020-05-11 DIAGNOSIS — G8250 Quadriplegia, unspecified: Secondary | ICD-10-CM | POA: Diagnosis not present

## 2020-05-11 DIAGNOSIS — G894 Chronic pain syndrome: Secondary | ICD-10-CM

## 2020-05-11 DIAGNOSIS — I5020 Unspecified systolic (congestive) heart failure: Secondary | ICD-10-CM

## 2020-05-11 DIAGNOSIS — J9621 Acute and chronic respiratory failure with hypoxia: Secondary | ICD-10-CM

## 2020-05-12 DIAGNOSIS — I5020 Unspecified systolic (congestive) heart failure: Secondary | ICD-10-CM | POA: Diagnosis not present

## 2020-05-12 DIAGNOSIS — G8250 Quadriplegia, unspecified: Secondary | ICD-10-CM

## 2020-05-12 DIAGNOSIS — G894 Chronic pain syndrome: Secondary | ICD-10-CM

## 2020-05-12 DIAGNOSIS — J9621 Acute and chronic respiratory failure with hypoxia: Secondary | ICD-10-CM | POA: Diagnosis not present

## 2020-05-13 DIAGNOSIS — I5020 Unspecified systolic (congestive) heart failure: Secondary | ICD-10-CM | POA: Diagnosis not present

## 2020-05-13 DIAGNOSIS — G8250 Quadriplegia, unspecified: Secondary | ICD-10-CM

## 2020-05-13 DIAGNOSIS — J9621 Acute and chronic respiratory failure with hypoxia: Secondary | ICD-10-CM

## 2020-05-13 DIAGNOSIS — G894 Chronic pain syndrome: Secondary | ICD-10-CM | POA: Diagnosis not present

## 2020-05-14 DIAGNOSIS — G894 Chronic pain syndrome: Secondary | ICD-10-CM

## 2020-05-14 DIAGNOSIS — G8250 Quadriplegia, unspecified: Secondary | ICD-10-CM

## 2020-05-14 DIAGNOSIS — J9621 Acute and chronic respiratory failure with hypoxia: Secondary | ICD-10-CM | POA: Diagnosis not present

## 2020-05-14 DIAGNOSIS — I5020 Unspecified systolic (congestive) heart failure: Secondary | ICD-10-CM

## 2020-05-22 DIAGNOSIS — G894 Chronic pain syndrome: Secondary | ICD-10-CM

## 2020-05-22 DIAGNOSIS — G8250 Quadriplegia, unspecified: Secondary | ICD-10-CM

## 2020-05-22 DIAGNOSIS — J9621 Acute and chronic respiratory failure with hypoxia: Secondary | ICD-10-CM

## 2020-05-22 DIAGNOSIS — I5020 Unspecified systolic (congestive) heart failure: Secondary | ICD-10-CM

## 2020-05-23 DIAGNOSIS — I5020 Unspecified systolic (congestive) heart failure: Secondary | ICD-10-CM

## 2020-05-23 DIAGNOSIS — J9621 Acute and chronic respiratory failure with hypoxia: Secondary | ICD-10-CM

## 2020-05-23 DIAGNOSIS — G894 Chronic pain syndrome: Secondary | ICD-10-CM

## 2020-05-23 DIAGNOSIS — G8250 Quadriplegia, unspecified: Secondary | ICD-10-CM | POA: Diagnosis not present

## 2020-05-24 DIAGNOSIS — J9621 Acute and chronic respiratory failure with hypoxia: Secondary | ICD-10-CM | POA: Diagnosis not present

## 2020-05-24 DIAGNOSIS — I5020 Unspecified systolic (congestive) heart failure: Secondary | ICD-10-CM

## 2020-05-24 DIAGNOSIS — G894 Chronic pain syndrome: Secondary | ICD-10-CM | POA: Diagnosis not present

## 2020-05-24 DIAGNOSIS — G8250 Quadriplegia, unspecified: Secondary | ICD-10-CM | POA: Diagnosis not present

## 2020-05-25 DIAGNOSIS — G894 Chronic pain syndrome: Secondary | ICD-10-CM | POA: Diagnosis not present

## 2020-05-25 DIAGNOSIS — J9621 Acute and chronic respiratory failure with hypoxia: Secondary | ICD-10-CM

## 2020-05-25 DIAGNOSIS — G8250 Quadriplegia, unspecified: Secondary | ICD-10-CM

## 2020-05-25 DIAGNOSIS — I5020 Unspecified systolic (congestive) heart failure: Secondary | ICD-10-CM | POA: Diagnosis not present

## 2020-05-26 DIAGNOSIS — G8250 Quadriplegia, unspecified: Secondary | ICD-10-CM

## 2020-05-26 DIAGNOSIS — J9621 Acute and chronic respiratory failure with hypoxia: Secondary | ICD-10-CM

## 2020-05-26 DIAGNOSIS — I5020 Unspecified systolic (congestive) heart failure: Secondary | ICD-10-CM | POA: Diagnosis not present

## 2020-05-26 DIAGNOSIS — G894 Chronic pain syndrome: Secondary | ICD-10-CM | POA: Diagnosis not present

## 2020-05-27 DIAGNOSIS — G894 Chronic pain syndrome: Secondary | ICD-10-CM

## 2020-05-27 DIAGNOSIS — J9621 Acute and chronic respiratory failure with hypoxia: Secondary | ICD-10-CM

## 2020-05-27 DIAGNOSIS — G8250 Quadriplegia, unspecified: Secondary | ICD-10-CM

## 2020-05-27 DIAGNOSIS — I5020 Unspecified systolic (congestive) heart failure: Secondary | ICD-10-CM | POA: Diagnosis not present

## 2020-05-28 DIAGNOSIS — I5020 Unspecified systolic (congestive) heart failure: Secondary | ICD-10-CM

## 2020-05-28 DIAGNOSIS — G8250 Quadriplegia, unspecified: Secondary | ICD-10-CM | POA: Diagnosis not present

## 2020-05-28 DIAGNOSIS — G894 Chronic pain syndrome: Secondary | ICD-10-CM | POA: Diagnosis not present

## 2020-05-28 DIAGNOSIS — J9621 Acute and chronic respiratory failure with hypoxia: Secondary | ICD-10-CM | POA: Diagnosis not present

## 2020-06-05 DIAGNOSIS — G8250 Quadriplegia, unspecified: Secondary | ICD-10-CM | POA: Diagnosis not present

## 2020-06-05 DIAGNOSIS — I5020 Unspecified systolic (congestive) heart failure: Secondary | ICD-10-CM

## 2020-06-05 DIAGNOSIS — G894 Chronic pain syndrome: Secondary | ICD-10-CM

## 2020-06-05 DIAGNOSIS — J9621 Acute and chronic respiratory failure with hypoxia: Secondary | ICD-10-CM

## 2020-06-06 DIAGNOSIS — G8250 Quadriplegia, unspecified: Secondary | ICD-10-CM | POA: Diagnosis not present

## 2020-06-06 DIAGNOSIS — G894 Chronic pain syndrome: Secondary | ICD-10-CM

## 2020-06-06 DIAGNOSIS — J9621 Acute and chronic respiratory failure with hypoxia: Secondary | ICD-10-CM | POA: Diagnosis not present

## 2020-06-06 DIAGNOSIS — I5020 Unspecified systolic (congestive) heart failure: Secondary | ICD-10-CM

## 2020-06-07 DIAGNOSIS — I5020 Unspecified systolic (congestive) heart failure: Secondary | ICD-10-CM

## 2020-06-07 DIAGNOSIS — G8250 Quadriplegia, unspecified: Secondary | ICD-10-CM

## 2020-06-07 DIAGNOSIS — G894 Chronic pain syndrome: Secondary | ICD-10-CM

## 2020-06-07 DIAGNOSIS — J9621 Acute and chronic respiratory failure with hypoxia: Secondary | ICD-10-CM | POA: Diagnosis not present

## 2020-06-08 DIAGNOSIS — I5020 Unspecified systolic (congestive) heart failure: Secondary | ICD-10-CM

## 2020-06-08 DIAGNOSIS — J9621 Acute and chronic respiratory failure with hypoxia: Secondary | ICD-10-CM

## 2020-06-08 DIAGNOSIS — G894 Chronic pain syndrome: Secondary | ICD-10-CM

## 2020-06-08 DIAGNOSIS — G8250 Quadriplegia, unspecified: Secondary | ICD-10-CM | POA: Diagnosis not present

## 2020-06-09 DIAGNOSIS — J9621 Acute and chronic respiratory failure with hypoxia: Secondary | ICD-10-CM

## 2020-06-09 DIAGNOSIS — G8250 Quadriplegia, unspecified: Secondary | ICD-10-CM

## 2020-06-09 DIAGNOSIS — G894 Chronic pain syndrome: Secondary | ICD-10-CM | POA: Diagnosis not present

## 2020-06-09 DIAGNOSIS — I5020 Unspecified systolic (congestive) heart failure: Secondary | ICD-10-CM

## 2020-06-10 DIAGNOSIS — I5020 Unspecified systolic (congestive) heart failure: Secondary | ICD-10-CM

## 2020-06-10 DIAGNOSIS — G894 Chronic pain syndrome: Secondary | ICD-10-CM | POA: Diagnosis not present

## 2020-06-10 DIAGNOSIS — J9621 Acute and chronic respiratory failure with hypoxia: Secondary | ICD-10-CM | POA: Diagnosis not present

## 2020-06-10 DIAGNOSIS — G8250 Quadriplegia, unspecified: Secondary | ICD-10-CM

## 2020-06-11 DIAGNOSIS — G8250 Quadriplegia, unspecified: Secondary | ICD-10-CM | POA: Diagnosis not present

## 2020-06-11 DIAGNOSIS — J9621 Acute and chronic respiratory failure with hypoxia: Secondary | ICD-10-CM

## 2020-06-11 DIAGNOSIS — I5020 Unspecified systolic (congestive) heart failure: Secondary | ICD-10-CM

## 2020-06-11 DIAGNOSIS — G894 Chronic pain syndrome: Secondary | ICD-10-CM

## 2020-06-17 DIAGNOSIS — I5020 Unspecified systolic (congestive) heart failure: Secondary | ICD-10-CM | POA: Diagnosis not present

## 2020-06-17 DIAGNOSIS — J9621 Acute and chronic respiratory failure with hypoxia: Secondary | ICD-10-CM | POA: Diagnosis not present

## 2020-06-17 DIAGNOSIS — G894 Chronic pain syndrome: Secondary | ICD-10-CM | POA: Diagnosis not present

## 2020-06-17 DIAGNOSIS — G8250 Quadriplegia, unspecified: Secondary | ICD-10-CM | POA: Diagnosis not present

## 2020-06-18 DIAGNOSIS — J9621 Acute and chronic respiratory failure with hypoxia: Secondary | ICD-10-CM | POA: Diagnosis not present

## 2020-06-18 DIAGNOSIS — G8250 Quadriplegia, unspecified: Secondary | ICD-10-CM | POA: Diagnosis not present

## 2020-06-18 DIAGNOSIS — G894 Chronic pain syndrome: Secondary | ICD-10-CM | POA: Diagnosis not present

## 2020-06-18 DIAGNOSIS — I5020 Unspecified systolic (congestive) heart failure: Secondary | ICD-10-CM | POA: Diagnosis not present

## 2020-06-19 DIAGNOSIS — I5020 Unspecified systolic (congestive) heart failure: Secondary | ICD-10-CM | POA: Diagnosis not present

## 2020-06-19 DIAGNOSIS — J9621 Acute and chronic respiratory failure with hypoxia: Secondary | ICD-10-CM | POA: Diagnosis not present

## 2020-06-19 DIAGNOSIS — G8250 Quadriplegia, unspecified: Secondary | ICD-10-CM | POA: Diagnosis not present

## 2020-06-19 DIAGNOSIS — G894 Chronic pain syndrome: Secondary | ICD-10-CM | POA: Diagnosis not present

## 2020-06-20 DIAGNOSIS — G8250 Quadriplegia, unspecified: Secondary | ICD-10-CM

## 2020-06-20 DIAGNOSIS — I5020 Unspecified systolic (congestive) heart failure: Secondary | ICD-10-CM | POA: Diagnosis not present

## 2020-06-20 DIAGNOSIS — J9621 Acute and chronic respiratory failure with hypoxia: Secondary | ICD-10-CM

## 2020-06-20 DIAGNOSIS — G894 Chronic pain syndrome: Secondary | ICD-10-CM

## 2020-06-21 DIAGNOSIS — J9621 Acute and chronic respiratory failure with hypoxia: Secondary | ICD-10-CM | POA: Diagnosis not present

## 2020-06-21 DIAGNOSIS — G894 Chronic pain syndrome: Secondary | ICD-10-CM

## 2020-06-21 DIAGNOSIS — G8250 Quadriplegia, unspecified: Secondary | ICD-10-CM | POA: Diagnosis not present

## 2020-06-21 DIAGNOSIS — I5020 Unspecified systolic (congestive) heart failure: Secondary | ICD-10-CM | POA: Diagnosis not present

## 2020-06-22 DIAGNOSIS — I5020 Unspecified systolic (congestive) heart failure: Secondary | ICD-10-CM

## 2020-06-22 DIAGNOSIS — G8250 Quadriplegia, unspecified: Secondary | ICD-10-CM | POA: Diagnosis not present

## 2020-06-22 DIAGNOSIS — J9621 Acute and chronic respiratory failure with hypoxia: Secondary | ICD-10-CM | POA: Diagnosis not present

## 2020-06-22 DIAGNOSIS — G894 Chronic pain syndrome: Secondary | ICD-10-CM | POA: Diagnosis not present

## 2020-06-23 DIAGNOSIS — I5020 Unspecified systolic (congestive) heart failure: Secondary | ICD-10-CM

## 2020-06-23 DIAGNOSIS — J9621 Acute and chronic respiratory failure with hypoxia: Secondary | ICD-10-CM

## 2020-06-23 DIAGNOSIS — G894 Chronic pain syndrome: Secondary | ICD-10-CM | POA: Diagnosis not present

## 2020-06-23 DIAGNOSIS — G8250 Quadriplegia, unspecified: Secondary | ICD-10-CM

## 2020-06-24 DIAGNOSIS — J9621 Acute and chronic respiratory failure with hypoxia: Secondary | ICD-10-CM

## 2020-06-24 DIAGNOSIS — G894 Chronic pain syndrome: Secondary | ICD-10-CM

## 2020-06-24 DIAGNOSIS — G8250 Quadriplegia, unspecified: Secondary | ICD-10-CM

## 2020-06-24 DIAGNOSIS — I5020 Unspecified systolic (congestive) heart failure: Secondary | ICD-10-CM

## 2020-06-25 DIAGNOSIS — J9621 Acute and chronic respiratory failure with hypoxia: Secondary | ICD-10-CM

## 2020-06-25 DIAGNOSIS — G8250 Quadriplegia, unspecified: Secondary | ICD-10-CM

## 2020-06-25 DIAGNOSIS — I5020 Unspecified systolic (congestive) heart failure: Secondary | ICD-10-CM

## 2020-06-25 DIAGNOSIS — G894 Chronic pain syndrome: Secondary | ICD-10-CM

## 2020-07-03 DIAGNOSIS — I5020 Unspecified systolic (congestive) heart failure: Secondary | ICD-10-CM

## 2020-07-03 DIAGNOSIS — J9621 Acute and chronic respiratory failure with hypoxia: Secondary | ICD-10-CM

## 2020-07-03 DIAGNOSIS — G8250 Quadriplegia, unspecified: Secondary | ICD-10-CM

## 2020-07-03 DIAGNOSIS — G894 Chronic pain syndrome: Secondary | ICD-10-CM

## 2020-07-04 DIAGNOSIS — J9621 Acute and chronic respiratory failure with hypoxia: Secondary | ICD-10-CM

## 2020-07-04 DIAGNOSIS — G8250 Quadriplegia, unspecified: Secondary | ICD-10-CM | POA: Diagnosis not present

## 2020-07-04 DIAGNOSIS — I5020 Unspecified systolic (congestive) heart failure: Secondary | ICD-10-CM | POA: Diagnosis not present

## 2020-07-04 DIAGNOSIS — G894 Chronic pain syndrome: Secondary | ICD-10-CM

## 2020-07-05 DIAGNOSIS — I5020 Unspecified systolic (congestive) heart failure: Secondary | ICD-10-CM

## 2020-07-05 DIAGNOSIS — G8250 Quadriplegia, unspecified: Secondary | ICD-10-CM

## 2020-07-05 DIAGNOSIS — J9621 Acute and chronic respiratory failure with hypoxia: Secondary | ICD-10-CM

## 2020-07-05 DIAGNOSIS — G894 Chronic pain syndrome: Secondary | ICD-10-CM

## 2020-07-06 DIAGNOSIS — J9621 Acute and chronic respiratory failure with hypoxia: Secondary | ICD-10-CM

## 2020-07-06 DIAGNOSIS — I5020 Unspecified systolic (congestive) heart failure: Secondary | ICD-10-CM

## 2020-07-06 DIAGNOSIS — G8250 Quadriplegia, unspecified: Secondary | ICD-10-CM

## 2020-07-06 DIAGNOSIS — G894 Chronic pain syndrome: Secondary | ICD-10-CM

## 2020-07-07 DIAGNOSIS — J9621 Acute and chronic respiratory failure with hypoxia: Secondary | ICD-10-CM | POA: Diagnosis not present

## 2020-07-07 DIAGNOSIS — G894 Chronic pain syndrome: Secondary | ICD-10-CM | POA: Diagnosis not present

## 2020-07-07 DIAGNOSIS — G8250 Quadriplegia, unspecified: Secondary | ICD-10-CM | POA: Diagnosis not present

## 2020-07-07 DIAGNOSIS — I5020 Unspecified systolic (congestive) heart failure: Secondary | ICD-10-CM | POA: Diagnosis not present

## 2020-07-08 DIAGNOSIS — I5020 Unspecified systolic (congestive) heart failure: Secondary | ICD-10-CM | POA: Diagnosis not present

## 2020-07-08 DIAGNOSIS — J9621 Acute and chronic respiratory failure with hypoxia: Secondary | ICD-10-CM

## 2020-07-08 DIAGNOSIS — G894 Chronic pain syndrome: Secondary | ICD-10-CM | POA: Diagnosis not present

## 2020-07-08 DIAGNOSIS — G8250 Quadriplegia, unspecified: Secondary | ICD-10-CM

## 2020-07-09 DIAGNOSIS — G894 Chronic pain syndrome: Secondary | ICD-10-CM | POA: Diagnosis not present

## 2020-07-09 DIAGNOSIS — J9621 Acute and chronic respiratory failure with hypoxia: Secondary | ICD-10-CM | POA: Diagnosis not present

## 2020-07-09 DIAGNOSIS — I5020 Unspecified systolic (congestive) heart failure: Secondary | ICD-10-CM

## 2020-07-09 DIAGNOSIS — G8250 Quadriplegia, unspecified: Secondary | ICD-10-CM | POA: Diagnosis not present

## 2020-07-17 DIAGNOSIS — J9621 Acute and chronic respiratory failure with hypoxia: Secondary | ICD-10-CM

## 2020-07-17 DIAGNOSIS — G8250 Quadriplegia, unspecified: Secondary | ICD-10-CM

## 2020-07-17 DIAGNOSIS — I5020 Unspecified systolic (congestive) heart failure: Secondary | ICD-10-CM

## 2020-07-17 DIAGNOSIS — G894 Chronic pain syndrome: Secondary | ICD-10-CM

## 2020-07-18 DIAGNOSIS — J9621 Acute and chronic respiratory failure with hypoxia: Secondary | ICD-10-CM

## 2020-07-18 DIAGNOSIS — G894 Chronic pain syndrome: Secondary | ICD-10-CM

## 2020-07-18 DIAGNOSIS — G8250 Quadriplegia, unspecified: Secondary | ICD-10-CM

## 2020-07-18 DIAGNOSIS — I5020 Unspecified systolic (congestive) heart failure: Secondary | ICD-10-CM

## 2020-07-19 DIAGNOSIS — G8250 Quadriplegia, unspecified: Secondary | ICD-10-CM

## 2020-07-19 DIAGNOSIS — I5020 Unspecified systolic (congestive) heart failure: Secondary | ICD-10-CM

## 2020-07-19 DIAGNOSIS — G894 Chronic pain syndrome: Secondary | ICD-10-CM

## 2020-07-19 DIAGNOSIS — J9621 Acute and chronic respiratory failure with hypoxia: Secondary | ICD-10-CM

## 2020-07-20 DIAGNOSIS — I5020 Unspecified systolic (congestive) heart failure: Secondary | ICD-10-CM

## 2020-07-20 DIAGNOSIS — G894 Chronic pain syndrome: Secondary | ICD-10-CM

## 2020-07-20 DIAGNOSIS — J9621 Acute and chronic respiratory failure with hypoxia: Secondary | ICD-10-CM

## 2020-07-20 DIAGNOSIS — G8250 Quadriplegia, unspecified: Secondary | ICD-10-CM

## 2020-07-28 IMAGING — CR DG CHEST 2V
2 series · 2 of 2 positions shown · non-contrast
Comparison: Single-view of the chest 01/26/2019 and 02/16/2018.

CLINICAL DATA: Increasing shortness of breath and wheezing over the
past 3-4 days.

EXAM:
CHEST - 2 VIEW

[w chest pa]
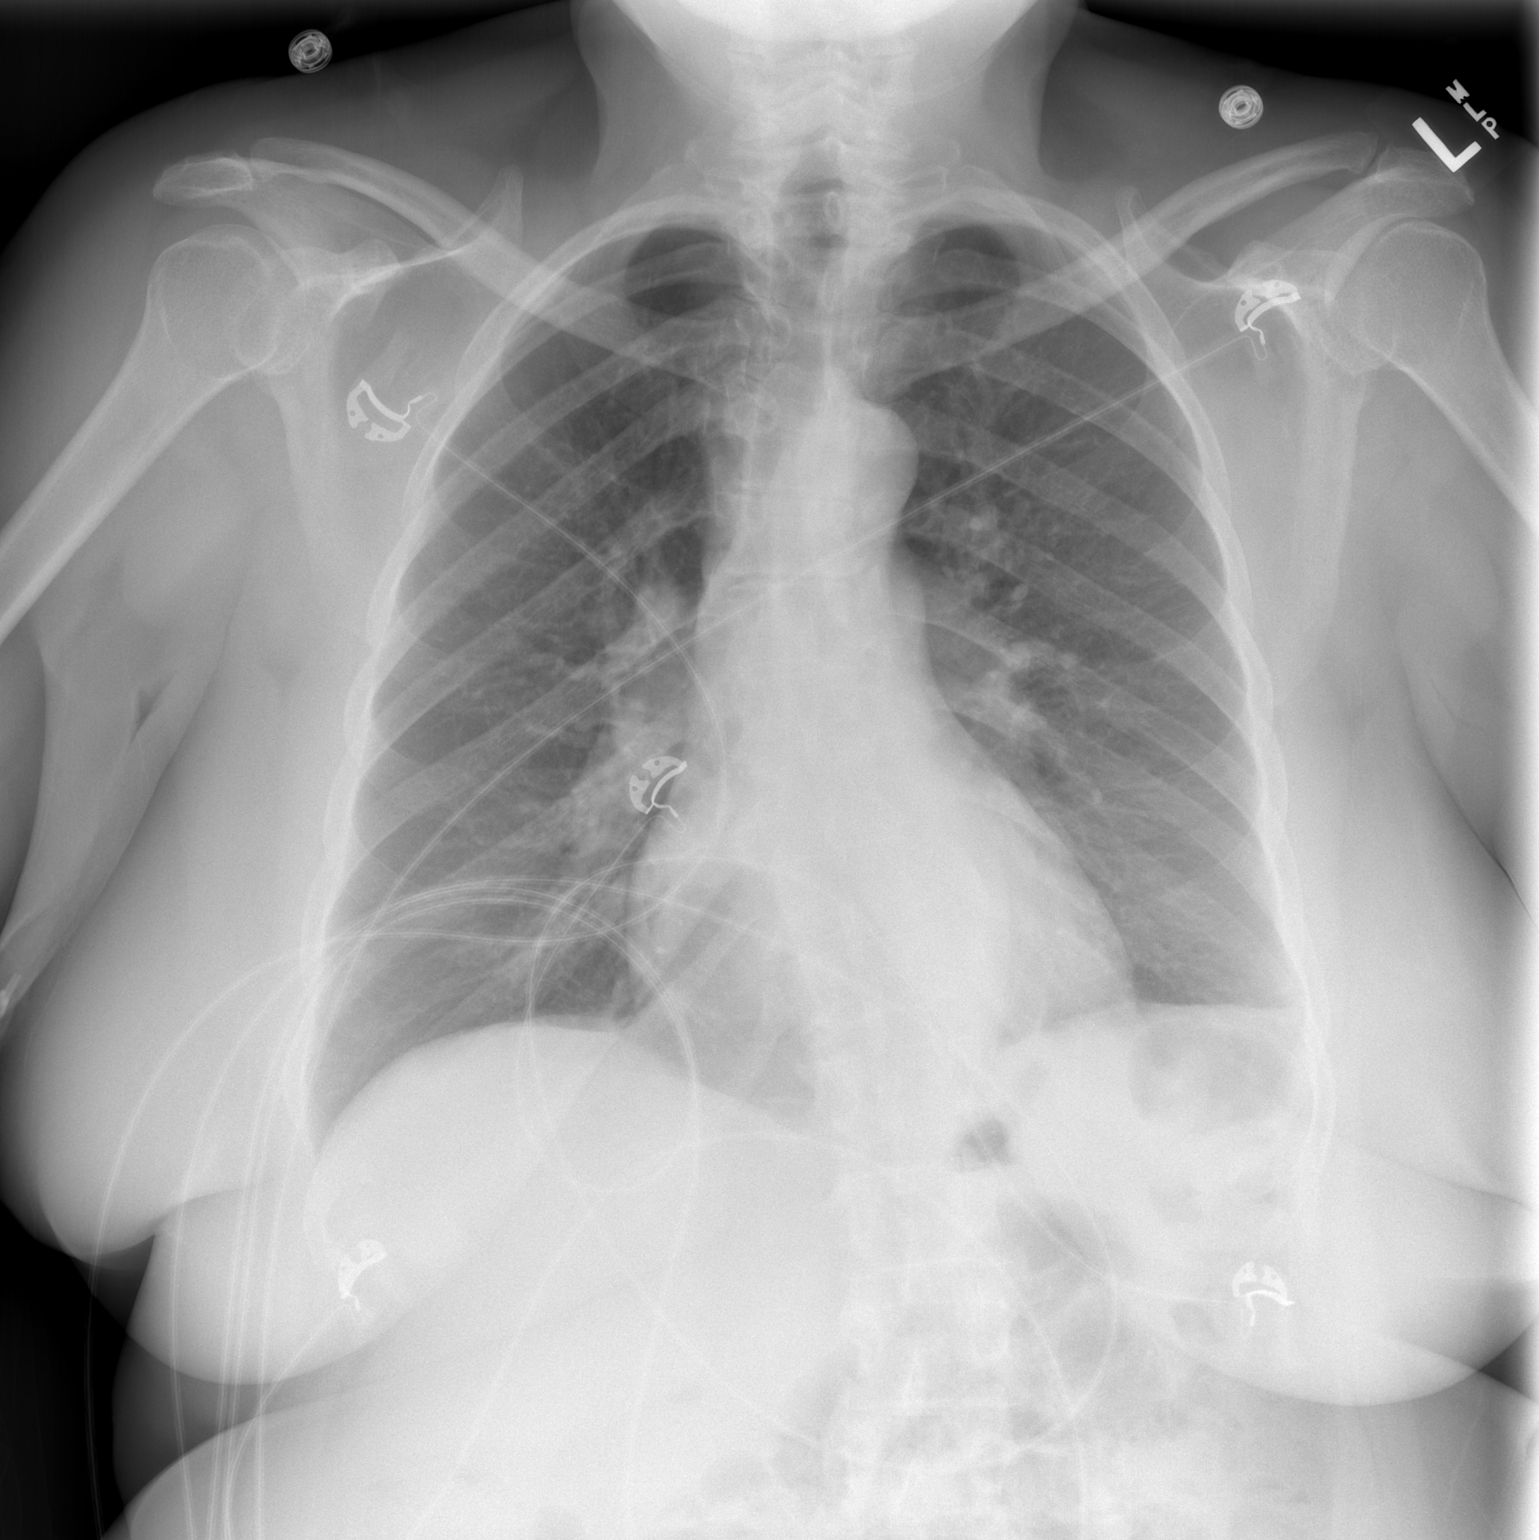

[w chest lat]
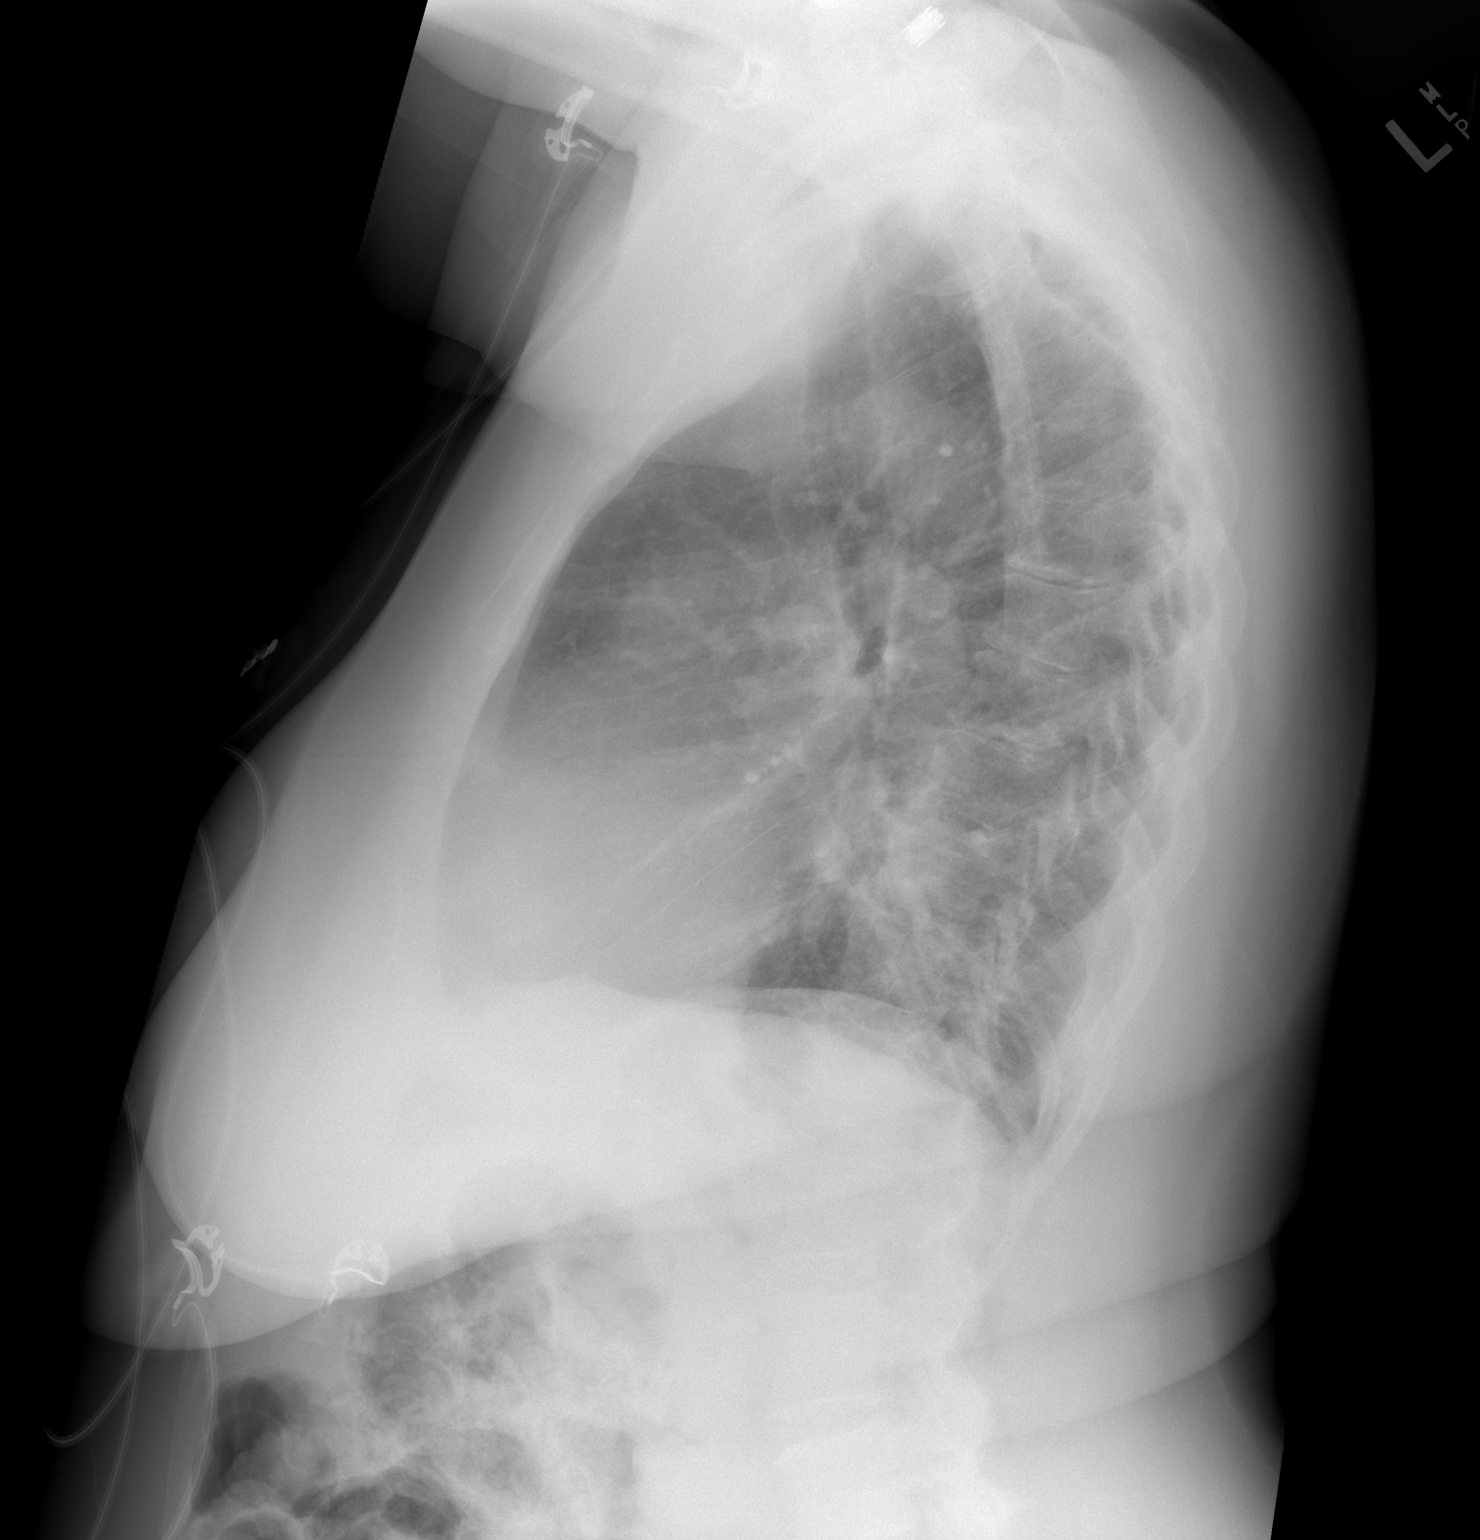

[2 of 2 positions shown; findings below may reference images not displayed]

FINDINGS: The lungs are clear. Heart size is normal. No pneumothorax or
pleural effusion. No acute or focal bony abnormality.
IMPRESSION: No acute disease.

## 2020-10-01 ENCOUNTER — Emergency Department (HOSPITAL_COMMUNITY): Payer: Medicare Other

## 2020-10-01 ENCOUNTER — Inpatient Hospital Stay (HOSPITAL_COMMUNITY)
Admission: EM | Admit: 2020-10-01 | Discharge: 2021-02-20 | DRG: 207 | Disposition: E | Payer: Medicare Other | Attending: Pulmonary Disease | Admitting: Pulmonary Disease

## 2020-10-01 ENCOUNTER — Other Ambulatory Visit: Payer: Self-pay

## 2020-10-01 DIAGNOSIS — N3090 Cystitis, unspecified without hematuria: Secondary | ICD-10-CM | POA: Diagnosis not present

## 2020-10-01 DIAGNOSIS — B961 Klebsiella pneumoniae [K. pneumoniae] as the cause of diseases classified elsewhere: Secondary | ICD-10-CM | POA: Diagnosis not present

## 2020-10-01 DIAGNOSIS — Z9911 Dependence on respirator [ventilator] status: Secondary | ICD-10-CM | POA: Diagnosis not present

## 2020-10-01 DIAGNOSIS — R252 Cramp and spasm: Secondary | ICD-10-CM | POA: Diagnosis present

## 2020-10-01 DIAGNOSIS — J9621 Acute and chronic respiratory failure with hypoxia: Secondary | ICD-10-CM

## 2020-10-01 DIAGNOSIS — J45909 Unspecified asthma, uncomplicated: Secondary | ICD-10-CM | POA: Diagnosis not present

## 2020-10-01 DIAGNOSIS — X58XXXA Exposure to other specified factors, initial encounter: Secondary | ICD-10-CM | POA: Diagnosis not present

## 2020-10-01 DIAGNOSIS — R7989 Other specified abnormal findings of blood chemistry: Secondary | ICD-10-CM | POA: Diagnosis present

## 2020-10-01 DIAGNOSIS — Z930 Tracheostomy status: Secondary | ICD-10-CM

## 2020-10-01 DIAGNOSIS — Z7951 Long term (current) use of inhaled steroids: Secondary | ICD-10-CM

## 2020-10-01 DIAGNOSIS — I959 Hypotension, unspecified: Secondary | ICD-10-CM | POA: Diagnosis not present

## 2020-10-01 DIAGNOSIS — R569 Unspecified convulsions: Secondary | ICD-10-CM | POA: Diagnosis not present

## 2020-10-01 DIAGNOSIS — B9562 Methicillin resistant Staphylococcus aureus infection as the cause of diseases classified elsewhere: Secondary | ICD-10-CM | POA: Diagnosis not present

## 2020-10-01 DIAGNOSIS — J4551 Severe persistent asthma with (acute) exacerbation: Secondary | ICD-10-CM | POA: Diagnosis present

## 2020-10-01 DIAGNOSIS — Z7902 Long term (current) use of antithrombotics/antiplatelets: Secondary | ICD-10-CM

## 2020-10-01 DIAGNOSIS — Z8249 Family history of ischemic heart disease and other diseases of the circulatory system: Secondary | ICD-10-CM

## 2020-10-01 DIAGNOSIS — J449 Chronic obstructive pulmonary disease, unspecified: Secondary | ICD-10-CM | POA: Diagnosis present

## 2020-10-01 DIAGNOSIS — L89300 Pressure ulcer of unspecified buttock, unstageable: Secondary | ICD-10-CM | POA: Diagnosis not present

## 2020-10-01 DIAGNOSIS — Y92239 Unspecified place in hospital as the place of occurrence of the external cause: Secondary | ICD-10-CM | POA: Diagnosis not present

## 2020-10-01 DIAGNOSIS — L8990 Pressure ulcer of unspecified site, unspecified stage: Secondary | ICD-10-CM | POA: Diagnosis present

## 2020-10-01 DIAGNOSIS — J398 Other specified diseases of upper respiratory tract: Secondary | ICD-10-CM | POA: Diagnosis present

## 2020-10-01 DIAGNOSIS — I10 Essential (primary) hypertension: Secondary | ICD-10-CM | POA: Diagnosis present

## 2020-10-01 DIAGNOSIS — R042 Hemoptysis: Secondary | ICD-10-CM

## 2020-10-01 DIAGNOSIS — I110 Hypertensive heart disease with heart failure: Secondary | ICD-10-CM | POA: Diagnosis present

## 2020-10-01 DIAGNOSIS — E871 Hypo-osmolality and hyponatremia: Secondary | ICD-10-CM | POA: Diagnosis not present

## 2020-10-01 DIAGNOSIS — Z833 Family history of diabetes mellitus: Secondary | ICD-10-CM

## 2020-10-01 DIAGNOSIS — J690 Pneumonitis due to inhalation of food and vomit: Secondary | ICD-10-CM | POA: Diagnosis present

## 2020-10-01 DIAGNOSIS — R0602 Shortness of breath: Secondary | ICD-10-CM

## 2020-10-01 DIAGNOSIS — G931 Anoxic brain damage, not elsewhere classified: Secondary | ICD-10-CM | POA: Diagnosis present

## 2020-10-01 DIAGNOSIS — Z931 Gastrostomy status: Secondary | ICD-10-CM | POA: Diagnosis not present

## 2020-10-01 DIAGNOSIS — J9811 Atelectasis: Secondary | ICD-10-CM | POA: Diagnosis present

## 2020-10-01 DIAGNOSIS — Z91013 Allergy to seafood: Secondary | ICD-10-CM

## 2020-10-01 DIAGNOSIS — Z7401 Bed confinement status: Secondary | ICD-10-CM

## 2020-10-01 DIAGNOSIS — A491 Streptococcal infection, unspecified site: Secondary | ICD-10-CM | POA: Diagnosis not present

## 2020-10-01 DIAGNOSIS — J9611 Chronic respiratory failure with hypoxia: Secondary | ICD-10-CM

## 2020-10-01 DIAGNOSIS — I2510 Atherosclerotic heart disease of native coronary artery without angina pectoris: Secondary | ICD-10-CM | POA: Diagnosis present

## 2020-10-01 DIAGNOSIS — G40409 Other generalized epilepsy and epileptic syndromes, not intractable, without status epilepticus: Secondary | ICD-10-CM | POA: Diagnosis not present

## 2020-10-01 DIAGNOSIS — I5032 Chronic diastolic (congestive) heart failure: Secondary | ICD-10-CM | POA: Diagnosis present

## 2020-10-01 DIAGNOSIS — Z794 Long term (current) use of insulin: Secondary | ICD-10-CM | POA: Diagnosis not present

## 2020-10-01 DIAGNOSIS — E875 Hyperkalemia: Secondary | ICD-10-CM | POA: Diagnosis present

## 2020-10-01 DIAGNOSIS — J9809 Other diseases of bronchus, not elsewhere classified: Secondary | ICD-10-CM | POA: Diagnosis present

## 2020-10-01 DIAGNOSIS — E669 Obesity, unspecified: Secondary | ICD-10-CM | POA: Diagnosis present

## 2020-10-01 DIAGNOSIS — Z515 Encounter for palliative care: Secondary | ICD-10-CM | POA: Diagnosis not present

## 2020-10-01 DIAGNOSIS — Z1624 Resistance to multiple antibiotics: Secondary | ICD-10-CM | POA: Diagnosis not present

## 2020-10-01 DIAGNOSIS — Z1621 Resistance to vancomycin: Secondary | ICD-10-CM | POA: Diagnosis not present

## 2020-10-01 DIAGNOSIS — B957 Other staphylococcus as the cause of diseases classified elsewhere: Secondary | ICD-10-CM | POA: Diagnosis not present

## 2020-10-01 DIAGNOSIS — J9622 Acute and chronic respiratory failure with hypercapnia: Secondary | ICD-10-CM | POA: Diagnosis present

## 2020-10-01 DIAGNOSIS — E1165 Type 2 diabetes mellitus with hyperglycemia: Secondary | ICD-10-CM | POA: Diagnosis not present

## 2020-10-01 DIAGNOSIS — Z789 Other specified health status: Secondary | ICD-10-CM | POA: Diagnosis not present

## 2020-10-01 DIAGNOSIS — K219 Gastro-esophageal reflux disease without esophagitis: Secondary | ICD-10-CM | POA: Diagnosis present

## 2020-10-01 DIAGNOSIS — J438 Other emphysema: Secondary | ICD-10-CM | POA: Diagnosis not present

## 2020-10-01 DIAGNOSIS — Z91040 Latex allergy status: Secondary | ICD-10-CM

## 2020-10-01 DIAGNOSIS — A419 Sepsis, unspecified organism: Secondary | ICD-10-CM | POA: Diagnosis not present

## 2020-10-01 DIAGNOSIS — Z79899 Other long term (current) drug therapy: Secondary | ICD-10-CM

## 2020-10-01 DIAGNOSIS — R21 Rash and other nonspecific skin eruption: Secondary | ICD-10-CM | POA: Diagnosis not present

## 2020-10-01 DIAGNOSIS — Z6841 Body Mass Index (BMI) 40.0 and over, adult: Secondary | ICD-10-CM

## 2020-10-01 DIAGNOSIS — Z825 Family history of asthma and other chronic lower respiratory diseases: Secondary | ICD-10-CM

## 2020-10-01 DIAGNOSIS — J0130 Acute sphenoidal sinusitis, unspecified: Secondary | ICD-10-CM | POA: Diagnosis present

## 2020-10-01 DIAGNOSIS — E119 Type 2 diabetes mellitus without complications: Secondary | ICD-10-CM | POA: Diagnosis not present

## 2020-10-01 DIAGNOSIS — R627 Adult failure to thrive: Secondary | ICD-10-CM | POA: Diagnosis present

## 2020-10-01 DIAGNOSIS — R000 Tachycardia, unspecified: Secondary | ICD-10-CM | POA: Diagnosis present

## 2020-10-01 DIAGNOSIS — R5381 Other malaise: Secondary | ICD-10-CM | POA: Diagnosis present

## 2020-10-01 DIAGNOSIS — Z66 Do not resuscitate: Secondary | ICD-10-CM | POA: Diagnosis not present

## 2020-10-01 DIAGNOSIS — E870 Hyperosmolality and hypernatremia: Secondary | ICD-10-CM | POA: Diagnosis not present

## 2020-10-01 DIAGNOSIS — R578 Other shock: Secondary | ICD-10-CM | POA: Diagnosis not present

## 2020-10-01 DIAGNOSIS — Z96642 Presence of left artificial hip joint: Secondary | ICD-10-CM | POA: Diagnosis present

## 2020-10-01 DIAGNOSIS — R0603 Acute respiratory distress: Secondary | ICD-10-CM | POA: Diagnosis not present

## 2020-10-01 DIAGNOSIS — Z91010 Allergy to peanuts: Secondary | ICD-10-CM

## 2020-10-01 DIAGNOSIS — E876 Hypokalemia: Secondary | ICD-10-CM | POA: Diagnosis not present

## 2020-10-01 DIAGNOSIS — Z20822 Contact with and (suspected) exposure to covid-19: Secondary | ICD-10-CM | POA: Diagnosis present

## 2020-10-01 DIAGNOSIS — J9601 Acute respiratory failure with hypoxia: Secondary | ICD-10-CM | POA: Diagnosis not present

## 2020-10-01 DIAGNOSIS — T884XXA Failed or difficult intubation, initial encounter: Secondary | ICD-10-CM

## 2020-10-01 DIAGNOSIS — R0902 Hypoxemia: Secondary | ICD-10-CM

## 2020-10-01 DIAGNOSIS — R509 Fever, unspecified: Secondary | ICD-10-CM

## 2020-10-01 DIAGNOSIS — R403 Persistent vegetative state: Secondary | ICD-10-CM | POA: Diagnosis present

## 2020-10-01 DIAGNOSIS — J441 Chronic obstructive pulmonary disease with (acute) exacerbation: Secondary | ICD-10-CM

## 2020-10-01 DIAGNOSIS — T884XXS Failed or difficult intubation, sequela: Secondary | ICD-10-CM | POA: Diagnosis not present

## 2020-10-01 DIAGNOSIS — D638 Anemia in other chronic diseases classified elsewhere: Secondary | ICD-10-CM | POA: Diagnosis present

## 2020-10-01 DIAGNOSIS — J9690 Respiratory failure, unspecified, unspecified whether with hypoxia or hypercapnia: Secondary | ICD-10-CM

## 2020-10-01 DIAGNOSIS — Z8674 Personal history of sudden cardiac arrest: Secondary | ICD-10-CM

## 2020-10-01 DIAGNOSIS — B952 Enterococcus as the cause of diseases classified elsewhere: Secondary | ICD-10-CM | POA: Diagnosis not present

## 2020-10-01 DIAGNOSIS — R7881 Bacteremia: Secondary | ICD-10-CM | POA: Diagnosis not present

## 2020-10-01 DIAGNOSIS — Z91041 Radiographic dye allergy status: Secondary | ICD-10-CM

## 2020-10-01 DIAGNOSIS — G253 Myoclonus: Secondary | ICD-10-CM | POA: Diagnosis present

## 2020-10-01 DIAGNOSIS — Z7189 Other specified counseling: Secondary | ICD-10-CM | POA: Diagnosis not present

## 2020-10-01 DIAGNOSIS — J310 Chronic rhinitis: Secondary | ICD-10-CM | POA: Diagnosis present

## 2020-10-01 DIAGNOSIS — R110 Nausea: Secondary | ICD-10-CM

## 2020-10-01 DIAGNOSIS — T17990A Other foreign object in respiratory tract, part unspecified in causing asphyxiation, initial encounter: Secondary | ICD-10-CM | POA: Diagnosis not present

## 2020-10-01 DIAGNOSIS — Z91012 Allergy to eggs: Secondary | ICD-10-CM

## 2020-10-01 DIAGNOSIS — J9600 Acute respiratory failure, unspecified whether with hypoxia or hypercapnia: Secondary | ICD-10-CM

## 2020-10-01 DIAGNOSIS — Z9981 Dependence on supplemental oxygen: Secondary | ICD-10-CM

## 2020-10-01 DIAGNOSIS — G40901 Epilepsy, unspecified, not intractable, with status epilepticus: Secondary | ICD-10-CM | POA: Diagnosis present

## 2020-10-01 DIAGNOSIS — Z8673 Personal history of transient ischemic attack (TIA), and cerebral infarction without residual deficits: Secondary | ICD-10-CM

## 2020-10-01 DIAGNOSIS — Z430 Encounter for attention to tracheostomy: Secondary | ICD-10-CM | POA: Diagnosis not present

## 2020-10-01 DIAGNOSIS — T41295A Adverse effect of other general anesthetics, initial encounter: Secondary | ICD-10-CM | POA: Diagnosis not present

## 2020-10-01 DIAGNOSIS — D72829 Elevated white blood cell count, unspecified: Secondary | ICD-10-CM | POA: Diagnosis not present

## 2020-10-01 DIAGNOSIS — R29810 Facial weakness: Secondary | ICD-10-CM | POA: Diagnosis present

## 2020-10-01 DIAGNOSIS — Z83438 Family history of other disorder of lipoprotein metabolism and other lipidemia: Secondary | ICD-10-CM

## 2020-10-01 LAB — URINALYSIS, ROUTINE W REFLEX MICROSCOPIC
Bilirubin Urine: NEGATIVE
Glucose, UA: NEGATIVE mg/dL
Ketones, ur: NEGATIVE mg/dL
Nitrite: NEGATIVE
Protein, ur: 30 mg/dL — AB
Specific Gravity, Urine: 1.016 (ref 1.005–1.030)
pH: 7 (ref 5.0–8.0)

## 2020-10-01 LAB — CBC WITH DIFFERENTIAL/PLATELET
Abs Immature Granulocytes: 0.05 10*3/uL (ref 0.00–0.07)
Basophils Absolute: 0.1 10*3/uL (ref 0.0–0.1)
Basophils Relative: 1 %
Eosinophils Absolute: 0.2 10*3/uL (ref 0.0–0.5)
Eosinophils Relative: 2 %
HCT: 38.5 % (ref 36.0–46.0)
Hemoglobin: 11.7 g/dL — ABNORMAL LOW (ref 12.0–15.0)
Immature Granulocytes: 1 %
Lymphocytes Relative: 15 %
Lymphs Abs: 1.7 10*3/uL (ref 0.7–4.0)
MCH: 26.5 pg (ref 26.0–34.0)
MCHC: 30.4 g/dL (ref 30.0–36.0)
MCV: 87.1 fL (ref 80.0–100.0)
Monocytes Absolute: 1 10*3/uL (ref 0.1–1.0)
Monocytes Relative: 9 %
Neutro Abs: 8 10*3/uL — ABNORMAL HIGH (ref 1.7–7.7)
Neutrophils Relative %: 72 %
Platelets: 328 10*3/uL (ref 150–400)
RBC: 4.42 MIL/uL (ref 3.87–5.11)
RDW: 15.1 % (ref 11.5–15.5)
WBC: 11 10*3/uL — ABNORMAL HIGH (ref 4.0–10.5)
nRBC: 0 % (ref 0.0–0.2)

## 2020-10-01 LAB — CBC
HCT: 39.6 % (ref 36.0–46.0)
Hemoglobin: 11.9 g/dL — ABNORMAL LOW (ref 12.0–15.0)
MCH: 26.3 pg (ref 26.0–34.0)
MCHC: 30.1 g/dL (ref 30.0–36.0)
MCV: 87.4 fL (ref 80.0–100.0)
Platelets: 302 10*3/uL (ref 150–400)
RBC: 4.53 MIL/uL (ref 3.87–5.11)
RDW: 15 % (ref 11.5–15.5)
WBC: 16.1 10*3/uL — ABNORMAL HIGH (ref 4.0–10.5)
nRBC: 0 % (ref 0.0–0.2)

## 2020-10-01 LAB — COMPREHENSIVE METABOLIC PANEL
ALT: 28 U/L (ref 0–44)
AST: 50 U/L — ABNORMAL HIGH (ref 15–41)
Albumin: 3 g/dL — ABNORMAL LOW (ref 3.5–5.0)
Alkaline Phosphatase: 112 U/L (ref 38–126)
Anion gap: 10 (ref 5–15)
BUN: 16 mg/dL (ref 8–23)
CO2: 27 mmol/L (ref 22–32)
Calcium: 10 mg/dL (ref 8.9–10.3)
Chloride: 100 mmol/L (ref 98–111)
Creatinine, Ser: 0.57 mg/dL (ref 0.44–1.00)
GFR, Estimated: >60 mL/min (ref >60)
Glucose, Bld: 202 mg/dL — ABNORMAL HIGH (ref 70–99)
Potassium: 5.2 mmol/L — ABNORMAL HIGH (ref 3.5–5.1)
Sodium: 137 mmol/L (ref 135–145)
Total Bilirubin: 0.8 mg/dL (ref 0.3–1.2)
Total Protein: 6.7 g/dL (ref 6.5–8.1)

## 2020-10-01 LAB — APTT: aPTT: 28 seconds (ref 24–36)

## 2020-10-01 LAB — HIV ANTIBODY (ROUTINE TESTING W REFLEX): HIV Screen 4th Generation wRfx: NONREACTIVE

## 2020-10-01 LAB — CREATININE, SERUM
Creatinine, Ser: 0.43 mg/dL — ABNORMAL LOW (ref 0.44–1.00)
GFR, Estimated: >60 mL/min (ref >60)

## 2020-10-01 LAB — PROTIME-INR
INR: 1 (ref 0.8–1.2)
Prothrombin Time: 13.1 seconds (ref 11.4–15.2)

## 2020-10-01 LAB — D-DIMER, QUANTITATIVE: D-Dimer, Quant: 1.47 ug/mL-FEU — ABNORMAL HIGH (ref 0.00–0.50)

## 2020-10-01 LAB — CBG MONITORING, ED: Glucose-Capillary: 105 mg/dL — ABNORMAL HIGH (ref 70–99)

## 2020-10-01 LAB — BRAIN NATRIURETIC PEPTIDE: B Natriuretic Peptide: 21.5 pg/mL (ref 0.0–100.0)

## 2020-10-01 LAB — RESP PANEL BY RT-PCR (FLU A&B, COVID) ARPGX2
Influenza A by PCR: NEGATIVE
Influenza B by PCR: NEGATIVE
SARS Coronavirus 2 by RT PCR: NEGATIVE

## 2020-10-01 LAB — GLUCOSE, CAPILLARY: Glucose-Capillary: 169 mg/dL — ABNORMAL HIGH (ref 70–99)

## 2020-10-01 LAB — LACTIC ACID, PLASMA
Lactic Acid, Venous: 3.6 mmol/L (ref 0.5–1.9)
Lactic Acid, Venous: 2.1 mmol/L (ref 0.5–1.9)

## 2020-10-01 MED ORDER — POTASSIUM BICARB-CITRIC ACID 20 MEQ PO TBEF: 1.0000 | EFFERVESCENT_TABLET | Freq: Every day | ORAL | Status: DC

## 2020-10-01 MED ORDER — IPRATROPIUM-ALBUTEROL 0.5-2.5 (3) MG/3ML IN SOLN
3.0000 mL | Freq: Once | RESPIRATORY_TRACT | Status: AC
  Administered 2020-10-01: 3 mL via RESPIRATORY_TRACT
  Filled 2020-10-01: qty 3

## 2020-10-01 MED ORDER — VANCOMYCIN HCL 1250 MG/250ML IV SOLN
1250.0000 mg | INTRAVENOUS | Status: DC
  Administered 2020-10-02 – 2020-10-03 (×2): 1250 mg via INTRAVENOUS
  Filled 2020-10-01 (×3): qty 250

## 2020-10-01 MED ORDER — METOPROLOL TARTRATE 25 MG PO TABS
25.0000 mg | ORAL_TABLET | Freq: Two times a day (BID) | ORAL | Status: DC
  Administered 2020-10-01 – 2020-10-02 (×2): 25 mg
  Filled 2020-10-01 (×2): qty 1

## 2020-10-01 MED ORDER — METRONIDAZOLE 500 MG/100ML IV SOLN
500.0000 mg | Freq: Three times a day (TID) | INTRAVENOUS | Status: DC
  Administered 2020-10-01 – 2020-10-04 (×8): 500 mg via INTRAVENOUS
  Filled 2020-10-01 (×8): qty 100

## 2020-10-01 MED ORDER — IPRATROPIUM-ALBUTEROL 0.5-2.5 (3) MG/3ML IN SOLN
3.0000 mL | Freq: Four times a day (QID) | RESPIRATORY_TRACT | Status: DC
  Administered 2020-10-01: 3 mL via RESPIRATORY_TRACT
  Filled 2020-10-01: qty 3

## 2020-10-01 MED ORDER — MONTELUKAST SODIUM 10 MG PO TABS
10.0000 mg | ORAL_TABLET | Freq: Every day | ORAL | Status: DC
  Administered 2020-10-01 – 2021-01-27 (×119): 10 mg
  Filled 2020-10-01 (×118): qty 1

## 2020-10-01 MED ORDER — INSULIN ASPART 100 UNIT/ML IJ SOLN
0.0000 [IU] | Freq: Three times a day (TID) | INTRAMUSCULAR | Status: DC
  Administered 2020-10-02: 2 [IU] via SUBCUTANEOUS
  Administered 2020-10-03: 3 [IU] via SUBCUTANEOUS
  Administered 2020-10-03: 5 [IU] via SUBCUTANEOUS
  Administered 2020-10-03: 3 [IU] via SUBCUTANEOUS
  Administered 2020-10-04: 8 [IU] via SUBCUTANEOUS
  Administered 2020-10-04: 5 [IU] via SUBCUTANEOUS
  Administered 2020-10-04: 11 [IU] via SUBCUTANEOUS
  Administered 2020-10-05: 8 [IU] via SUBCUTANEOUS

## 2020-10-01 MED ORDER — CLONAZEPAM 0.5 MG PO TABS
0.5000 mg | ORAL_TABLET | Freq: Two times a day (BID) | ORAL | Status: DC
  Administered 2020-10-01 – 2020-10-16 (×30): 0.5 mg
  Filled 2020-10-01 (×30): qty 1

## 2020-10-01 MED ORDER — IPRATROPIUM-ALBUTEROL 0.5-2.5 (3) MG/3ML IN SOLN
3.0000 mL | Freq: Four times a day (QID) | RESPIRATORY_TRACT | Status: DC | PRN
  Filled 2020-10-01: qty 3

## 2020-10-01 MED ORDER — SODIUM CHLORIDE 0.9 % IV SOLN
2.0000 g | Freq: Three times a day (TID) | INTRAVENOUS | Status: DC
  Administered 2020-10-01 – 2020-10-04 (×8): 2 g via INTRAVENOUS
  Filled 2020-10-01 (×8): qty 2

## 2020-10-01 MED ORDER — IPRATROPIUM-ALBUTEROL 0.5-2.5 (3) MG/3ML IN SOLN
3.0000 mL | RESPIRATORY_TRACT | Status: DC | PRN
  Administered 2020-10-01 (×3): 3 mL via RESPIRATORY_TRACT
  Filled 2020-10-01 (×3): qty 3

## 2020-10-01 MED ORDER — SODIUM CHLORIDE 0.9 % IV BOLUS (SEPSIS)
1000.0000 mL | Freq: Once | INTRAVENOUS | Status: AC
  Administered 2020-10-01: 1000 mL via INTRAVENOUS

## 2020-10-01 MED ORDER — CLOPIDOGREL BISULFATE 75 MG PO TABS
75.0000 mg | ORAL_TABLET | Freq: Every day | ORAL | Status: DC
  Administered 2020-10-01 – 2021-01-13 (×105): 75 mg
  Filled 2020-10-01 (×106): qty 1

## 2020-10-01 MED ORDER — INSULIN GLARGINE 100 UNIT/ML ~~LOC~~ SOLN
40.0000 [IU] | Freq: Every day | SUBCUTANEOUS | Status: DC
  Administered 2020-10-01 – 2020-11-15 (×46): 40 [IU] via SUBCUTANEOUS
  Filled 2020-10-01 (×49): qty 0.4

## 2020-10-01 MED ORDER — FAMOTIDINE 20 MG PO TABS
20.0000 mg | ORAL_TABLET | Freq: Two times a day (BID) | ORAL | Status: DC
  Administered 2020-10-01 – 2021-01-27 (×236): 20 mg
  Filled 2020-10-01 (×236): qty 1

## 2020-10-01 MED ORDER — BUDESONIDE 0.5 MG/2ML IN SUSP
0.5000 mg | Freq: Two times a day (BID) | RESPIRATORY_TRACT | Status: DC
  Administered 2020-10-01 – 2021-01-27 (×236): 0.5 mg via RESPIRATORY_TRACT
  Filled 2020-10-01 (×237): qty 2

## 2020-10-01 MED ORDER — METHYLPREDNISOLONE SODIUM SUCC 40 MG IJ SOLR
40.0000 mg | Freq: Every day | INTRAMUSCULAR | Status: DC
  Administered 2020-10-01 – 2020-10-02 (×2): 40 mg via INTRAVENOUS
  Filled 2020-10-01 (×3): qty 1

## 2020-10-01 MED ORDER — SODIUM CHLORIDE 0.9 % IV SOLN
2.0000 g | Freq: Once | INTRAVENOUS | Status: AC
  Administered 2020-10-01: 2 g via INTRAVENOUS
  Filled 2020-10-01: qty 2

## 2020-10-01 MED ORDER — VANCOMYCIN HCL 2000 MG/400ML IV SOLN
2000.0000 mg | Freq: Once | INTRAVENOUS | Status: AC
  Administered 2020-10-01: 2000 mg via INTRAVENOUS
  Filled 2020-10-01: qty 400

## 2020-10-01 MED ORDER — IPRATROPIUM-ALBUTEROL 0.5-2.5 (3) MG/3ML IN SOLN
3.0000 mL | RESPIRATORY_TRACT | Status: DC
  Administered 2020-10-01 – 2020-10-06 (×27): 3 mL via RESPIRATORY_TRACT
  Filled 2020-10-01 (×27): qty 3

## 2020-10-01 MED ORDER — SENNOSIDES-DOCUSATE SODIUM 8.6-50 MG PO TABS
1.0000 | ORAL_TABLET | Freq: Every morning | ORAL | Status: DC
  Administered 2020-10-02 – 2020-10-17 (×12): 1
  Filled 2020-10-01 (×10): qty 1

## 2020-10-01 MED ORDER — LEVETIRACETAM 100 MG/ML PO SOLN
2500.0000 mg | Freq: Two times a day (BID) | ORAL | Status: DC
  Administered 2020-10-01 – 2021-01-27 (×236): 2500 mg
  Filled 2020-10-01 (×5): qty 25
  Filled 2020-10-01: qty 30
  Filled 2020-10-01 (×32): qty 25
  Filled 2020-10-01: qty 30
  Filled 2020-10-01 (×3): qty 25
  Filled 2020-10-01 (×2): qty 30
  Filled 2020-10-01 (×61): qty 25
  Filled 2020-10-01: qty 30
  Filled 2020-10-01 (×28): qty 25
  Filled 2020-10-01 (×2): qty 30
  Filled 2020-10-01 (×26): qty 25
  Filled 2020-10-01: qty 30
  Filled 2020-10-01 (×6): qty 25
  Filled 2020-10-01: qty 30
  Filled 2020-10-01 (×53): qty 25
  Filled 2020-10-01: qty 30
  Filled 2020-10-01 (×43): qty 25

## 2020-10-01 MED ORDER — FUROSEMIDE 40 MG PO TABS
40.0000 mg | ORAL_TABLET | Freq: Every day | ORAL | Status: DC
  Administered 2020-10-01 – 2020-10-02 (×2): 40 mg
  Filled 2020-10-01 (×2): qty 1

## 2020-10-01 MED ORDER — METRONIDAZOLE 500 MG/100ML IV SOLN
500.0000 mg | Freq: Once | INTRAVENOUS | Status: AC
  Administered 2020-10-01: 500 mg via INTRAVENOUS
  Filled 2020-10-01: qty 100

## 2020-10-01 MED ORDER — POTASSIUM CHLORIDE 20 MEQ PO PACK
20.0000 meq | PACK | Freq: Every day | ORAL | Status: DC
  Administered 2020-10-02 – 2020-10-17 (×16): 20 meq
  Filled 2020-10-01 (×18): qty 1

## 2020-10-01 MED ORDER — LACTATED RINGERS IV SOLN: INTRAVENOUS | Status: AC

## 2020-10-01 MED ORDER — CHLORHEXIDINE GLUCONATE 0.12 % MT SOLN
15.0000 mL | Freq: Two times a day (BID) | OROMUCOSAL | Status: DC
  Administered 2020-10-01 – 2020-10-07 (×12): 15 mL via OROMUCOSAL
  Filled 2020-10-01 (×11): qty 15

## 2020-10-01 MED ORDER — SODIUM CHLORIDE 0.9 % IV BOLUS (SEPSIS)
1000.0000 mL | Freq: Once | INTRAVENOUS | Status: AC
  Administered 2020-10-01 (×2): 1000 mL via INTRAVENOUS

## 2020-10-01 MED ORDER — ENOXAPARIN SODIUM 40 MG/0.4ML IJ SOSY
40.0000 mg | PREFILLED_SYRINGE | INTRAMUSCULAR | Status: DC
  Administered 2020-10-01: 40 mg via SUBCUTANEOUS
  Filled 2020-10-01: qty 0.4

## 2020-10-01 MED ORDER — SODIUM CHLORIDE 0.9 % IV BOLUS (SEPSIS)
1000.0000 mL | Freq: Once | INTRAVENOUS | Status: AC
Start: 2020-10-01 — End: 2020-10-01
  Administered 2020-10-01: 1000 mL via INTRAVENOUS

## 2020-10-01 NOTE — ED Notes (Signed)
RT at bedside for neb tx and suction. Pt maintains O2 at 98-99% on current settings.

## 2020-10-01 NOTE — ED Notes (Signed)
Per Dr.Horton, she wants to wait for lab results before giving pt more fluids. Orders on hold right now until lab results.

## 2020-10-01 NOTE — Progress Notes (Signed)
Pharmacy Antibiotic Note  Sonia Drake is a 65 y.o. female admitted on 24-Oct-2020 with  infection of unknown source .  Pharmacy has been consulted for Cefepime and vancomycin dosing.  WBC 11, LA 3.6, SCr wnl   Plan: -Cefepime 2 gm IV Q 8 hours -Vancomycin 2 gm IV load followed by Vancomycin 1250 mg IV Q 24 hrs. Goal AUC 400-550. Expected AUC: 505 SCr used: 0.7 -Monitor CBC, renal fx, cultures and clinical progress -Vanc levels as indicated       Temp (24hrs), Avg:98.8 F (37.1 C), Min:98.8 F (37.1 C), Max:98.8 F (37.1 C)  Recent Labs  Lab 10/24/2020 1126 24-Oct-2020 1127  CREATININE  --  0.57  LATICACIDVEN 3.6*  --     CrCl cannot be calculated (Unknown ideal weight.).    Allergies  Allergen Reactions   Contrast Media [Iodinated Diagnostic Agents] Anaphylaxis and Other (See Comments)    ** CARDIAC ARREST **   Peanuts [Peanut Oil] Shortness Of Breath and Swelling   Shellfish Allergy Shortness Of Breath and Swelling   Soap Shortness Of Breath and Swelling    LAUNDRY DETERGENT   Latex Hives   Adenosine    Eggs Or Egg-Derived Products Rash    Antimicrobials this admission: Cefepime 6/12 >>  Vancomycin 6/12 >>  Metronidazole 6/12 >>   Dose adjustments this admission:   Microbiology results: 6/12 BCx:  6/12 UCx:     Thank you for allowing pharmacy to be a part of this patient's care.  Vinnie Level, PharmD., BCPS, BCCCP Clinical Pharmacist Please refer to [facility] for unit-specific pharmacist

## 2020-10-01 NOTE — ED Triage Notes (Signed)
From [facility]. Trach pt and at baseline mental status. Vomiting through trach with same color as feeding tube contents. EMS reports also some mucus. O2 low 80s at facility. Facility gave 5mg  Albuterol, 0.5mg  Atrovent. EMS gave additional 2.5 mg Albuterol. O2 now at 98% on 8LPM. Baseline O2 at 4LPM in facility.

## 2020-10-01 NOTE — Progress Notes (Signed)
Elink monitoring for sepsis protocol 

## 2020-10-01 NOTE — Progress Notes (Signed)
   10/09/2020 1815  Vitals  Temp 99.7 F (37.6 C)  Temp Source Axillary  BP (!) 105/49  MAP (mmHg) (!) 64  BP Location Right Arm  BP Method Automatic  Patient Position (if appropriate) Lying  Pulse Rate (!) 107  Pulse Rate Source Monitor  ECG Heart Rate (!) 107  Resp (!) 22  MEWS COLOR  MEWS Score Color Red  Oxygen Therapy  SpO2 96 %  O2 Device Tracheostomy Collar  O2 Flow Rate (L/min) 8 L/min  FiO2 (%) 35 %  Patient Activity (if Appropriate) In bed  Pulse Oximetry Type Continuous  Pain Assessment  Pain Scale FLACC  Pain Assessment/FLACC  Pain Rating: FLACC  - Face 0  Pain Rating: FLACC - Legs 0  Pain Rating: FLACC - Activity 0  Pain Rating: FLACC - Cry 0  Pain Rating: FLACC - Consolability 0  Score: FLACC  0   Admitted from the ED. RR 30's. RT administered meds. Unable to perform skin assessment d/t respiratory status at this time. Emergency respiratory supplies placed at the bedside. Family updated on the plan of care.

## 2020-10-01 NOTE — Progress Notes (Signed)
   2020/10/24 1815  Assess: MEWS Score  Temp 99.7 F (37.6 C)  BP (!) 105/49  Pulse Rate (!) 107  ECG Heart Rate (!) 107  Resp (!) 22  SpO2 96 %  O2 Device Tracheostomy Collar  Patient Activity (if Appropriate) In bed  O2 Flow Rate (L/min) 8 L/min  FiO2 (%) 35 %  Assess: MEWS Score  MEWS Temp 0  MEWS Systolic 0  MEWS Pulse 1  MEWS RR 1  MEWS LOC 2  MEWS Score 4  MEWS Score Color Red  Assess: if the MEWS score is Yellow or Red  Were vital signs taken at a resting state? Yes  Focused Assessment No change from prior assessment  Early Detection of Sepsis Score *See Row Information* High  MEWS guidelines implemented *See Row Information* Yes  Treat  Pain Scale FLACC  Take Vital Signs  Increase Vital Sign Frequency  Red: Q 1hr X 4 then Q 4hr X 4, if remains red, continue Q 4hrs  Escalate  MEWS: Escalate Red: discuss with charge nurse/RN and provider, consider discussing with RRT  Notify: Charge Nurse/RN  Name of Charge Nurse/RN Notified Erick Alley  Date Charge Nurse/RN Notified 24-Oct-2020  Time Charge Nurse/RN Notified 1815  Notify: Provider  Provider Name/Title Orland Mustard  Date Provider Notified Oct 24, 2020  Time Provider Notified 1858  Notification Type Page  Notification Reason Other (Comment) (red mews on admit)  Provider response Other (Comment) (Critical care notified to keep an eye out)  Date of Provider Response 2020/10/24  Time of Provider Response 1859  Notify: Rapid Response  Name of Rapid Response RN Notified Mindy RN  Date Rapid Response Notified 2020/10/24  Time Rapid Response Notified 1900  Document  Patient Outcome Stabilized after interventions  Progress note created (see row info) Yes   Please see admission note as well

## 2020-10-01 NOTE — ED Notes (Signed)
Lab notified at this time regarding CBC pending results. Per lab, it was not in process as the sample was not enough. Lab did not call RN regarding the problem. Phlebotomist is coming to redraw CBC.

## 2020-10-01 NOTE — ED Notes (Signed)
Brought in by EMS from [facility] facility for respiratory distress. Pt has a trach in place. O2 in the low 80s per facility. EMS also reports feeding tube color contents on trach with some mucus. Facility gave 5mg  Albuterol, 0.5mg  Atrovent and Budesonide. EMS gave an additional 2.5mg  Albuterol en route to [facility]. On home O2 at 4LPM via trach. Currently at 8LPM with 96-98% on the monitor. Pt only responds to pain and withdraws to pain. Daughter states this is her baseline mental status. Does not talk. Cardiac monitoring in place.

## 2020-10-01 NOTE — ED Notes (Signed)
MD wants to hold off on 3rd liter until 2nd liter is finished.

## 2020-10-01 NOTE — ED Provider Notes (Signed)
[facility] EMERGENCY DEPARTMENT Provider Note   CSN: [id number] Arrival date & time: 09/24/2020  0930     History Chief Complaint  Patient presents with   Respiratory Distress    Sonia Drake is a 65 y.o. female.  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  Reports that there was copious secretions coming out of the trach at the facility.  1 member had concern for possible vomitus coming out of the trach.  Patient was suctioned but found to be hypoxic and required increased amount of oxygen so EMS was called.  Daughter is currently at bedside and is only aware of the low oxygen levels.  No staff members at bedside.  Patient is nonverbal at baseline and noncontributory towards history.  Past Medical History:  Diagnosis Date   Arthritis    "right knee; left hip" (04/07/2017)   Asthma    CHF (congestive heart failure) (HCC)    reported treatment for fluid overload in the setting of asthma exacerbation ~ 1998   COPD (chronic obstructive pulmonary disease) (HCC)    patient states she does not have COPD but was treated for it.    Depression    "from chemical imbalance" (04/07/2017)   Dyspnea    Dysrhythmia    occasional skipped beats   GERD (gastroesophageal reflux disease)    On home oxygen therapy    "I sleep with 2L" (04/07/2017)   Pneumonia    "several times" (04/07/2017)   Seizures (HCC) 2003 X 1   unknown cause    Patient Active Problem List   Diagnosis Date Noted   COPD with acute exacerbation (HCC) 08/28/2019   Chest pain 08/28/2019   Acute and chronic respiratory failure with hypercapnia (HCC) 01/27/2019   Asthma with COPD with exacerbation (HCC) 01/26/2019   GERD (gastroesophageal reflux disease) 02/16/2018   Seizures (HCC) 02/16/2018   Bronchitis 11/11/2017   Acute on chronic  respiratory failure with hypoxia (HCC) 11/03/2017   Respiratory distress    Acute respiratory failure with hypoxia (HCC) 11/02/2017   Chronic diastolic CHF (congestive heart failure) (HCC) 09/19/2017   Primary osteoarthritis of left hip 04/07/2017   Osteoarthritis of left hip 04/02/2017   Lung nodule 02/04/2016   Asthma exacerbation with COPD (chronic obstructive pulmonary disease) (HCC) 07/21/2015   Influenza B 04/23/2015   Asthma exacerbation 04/22/2015   Hypokalemia 04/22/2015   Sick-euthyroid syndrome 07/03/2014   Influenza with pneumonia 07/03/2014   Multiple lung nodules on CT 07/03/2014   Asthma 07/02/2014   Hyperglycemia 07/02/2014   Chronic obstructive pulmonary disease with acute exacerbation [facility])     Past Surgical History:  Procedure Laterality Date   JOINT REPLACEMENT     KNEE ARTHROSCOPY Right 2017   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     Family History  Problem Relation Age of Onset   Heart disease Mother    Heart disease Father    Diabetes Father    Asthma Father    Diabetes Brother    Diabetes Sister    Asthma Brother    Asthma Sister    Hypertension Sister  Heart failure Brother    Hypertension Brother    Diabetes Brother    Heart failure Brother    Hypertension Brother    Diabetes Brother    Hypertension Brother    Hyperlipidemia Brother     Social History   Tobacco Use   Smoking status: Never   Smokeless tobacco: Never  Vaping Use   Vaping Use: Never used  Substance Use Topics   Alcohol use: No   Drug use: No    Home Medications Prior to Admission medications   Medication Sig Start Date End Date Taking? Authorizing Provider  budesonide (PULMICORT) 0.5 MG/2ML nebulizer solution Take 0.5 mg by  nebulization 2 (two) times daily.   Yes [provider]  chlorhexidine (PERIDEX) 0.12 % solution Use as directed 15 mLs in the mouth or throat 2 (two) times daily.   Yes [provider]  clonazePAM (KLONOPIN) 0.5 MG tablet Place 0.5 mg into feeding tube 2 (two) times daily.   Yes [provider]  clopidogrel (PLAVIX) 75 MG tablet Place 75 mg into feeding tube daily.   Yes [provider]  famotidine (PEPCID) 20 MG tablet Take 20 mg by mouth 2 (two) times daily.   Yes [provider]  furosemide (LASIX) 40 MG tablet Place 40 mg into feeding tube daily.   Yes [provider]  insulin glargine (LANTUS) 100 UNIT/ML injection Inject 40 Units into the skin daily.   Yes [provider]  insulin lispro (HUMALOG) 100 UNIT/ML injection Inject 2-12 Units into the skin See admin instructions. Inject as per sliding scale: 0-150 = 0 units, 151-200 = 2 units, 201-250 = 4 units, 251- 300 = 6 units, 301- 350 = 8 units, 351- 400 = 10 units, 401+ = 12 units.   Yes [provider]  ipratropium-albuterol (DUONEB) 0.5-2.5 (3) MG/3ML SOLN Inhale 3 mLs into the lungs every 6 (six) hours as needed (SOB, wheezing).  08/21/19  Yes [provider]  levETIRAcetam (KEPPRA) 100 MG/ML solution Take 2,500 mg by mouth 2 (two) times daily.   Yes [provider]  metoprolol tartrate (LOPRESSOR) 25 MG tablet Take 25 mg by mouth 2 (two) times daily.   Yes [provider]  montelukast (SINGULAIR) 10 MG tablet Take 1 tablet (10 mg total) by mouth daily. Patient taking differently: Place 10 mg into feeding tube daily. 04/14/18  Yes Emokpae, Courage, MD  Potassium Bicarb-Citric Acid 20 MEQ TBEF Place 1 tablet into feeding tube daily.   Yes [provider]  senna-docusate (SENOKOT-S) 8.6-50 MG tablet Take 1 tablet by mouth at bedtime. Patient taking differently: Place 1 tablet into feeding tube in the morning. 08/30/19  Yes Sheikh, Omair  Latif, DO  albuterol (PROVENTIL HFA;VENTOLIN HFA) 108 (90 Base) MCG/ACT inhaler Inhale 2 puffs into the lungs every 6 (six) hours as needed for wheezing or shortness of breath. Patient not taking: Reported on 10-20-20 04/14/18   Shon Hale, MD  albuterol (PROVENTIL) (2.5 MG/3ML) 0.083% nebulizer solution Take 3 mLs (2.5 mg total) by nebulization every 6 (six) hours as needed for wheezing or shortness of breath. Patient not taking: Reported on October 20, 2020 01/30/19   Rhetta Mura, MD  budesonide-formoterol [facility]) 160-4.5 MCG/ACT inhaler Inhale 2 puffs into the lungs 2 (two) times daily. Patient not taking: No sig reported 04/14/18   Shon Hale, MD  calcium-vitamin D (OSCAL WITH D) 500-200 MG-UNIT tablet Take 1 tablet by mouth daily with breakfast. Patient not taking: No sig reported 04/14/18   Emokpae,  Courage, MD  furosemide (LASIX) 20 MG tablet Take 1 tablet (20 mg total) by mouth daily. 08/30/19 09/29/19  Marguerita MerlesSheikh, Omair Latif, DO    Allergies    Contrast media [iodinated diagnostic agents], Peanuts [peanut oil], Shellfish allergy, Soap, Latex, Adenosine, and Eggs or egg-derived products  Review of Systems   Review of Systems  Unable to perform ROS: Patient nonverbal   Physical Exam Updated Vital Signs BP 125/68   Pulse (!) 120   Temp 98.8 F (37.1 C) (Axillary)   Resp (!) 26   SpO2 100%   Physical Exam Vitals and nursing note reviewed.  Constitutional:      Appearance: She is obese.     Comments: Nonverbal  HENT:     Head: Normocephalic.     Mouth/Throat:     Mouth: Mucous membranes are moist.  Cardiovascular:     Rate and Rhythm: Tachycardia present.  Pulmonary:     Breath sounds: Rales present.     Comments: Tachypneic, trach in place, secretions heard Abdominal:     Palpations: Abdomen is soft.     Tenderness: There is no abdominal tenderness.  Skin:    General: Skin is warm.  Neurological:     Mental Status: Mental status is at baseline.     ED Results / Procedures / Treatments   Labs (all labs ordered are listed, but only abnormal results are displayed) Labs Reviewed  LACTIC ACID, PLASMA - Abnormal; Notable for the following components:      Result Value   Lactic Acid, Venous 3.6 (*)    All other components within normal limits  COMPREHENSIVE METABOLIC PANEL - Abnormal; Notable for the following components:   Potassium 5.2 (*)    Glucose, Bld 202 (*)    Albumin 3.0 (*)    AST 50 (*)    All other components within normal limits  URINALYSIS, ROUTINE W REFLEX MICROSCOPIC - Abnormal; Notable for the following components:   APPearance HAZY (*)    Hgb urine dipstick SMALL (*)    Protein, ur 30 (*)    Leukocytes,Ua MODERATE (*)    Bacteria, UA RARE (*)    All other components within normal limits  D-DIMER, QUANTITATIVE (NOT AT [facility]) - Abnormal; Notable for the following components:   D-Dimer, Quant 1.47 (*)    All other components within normal limits  RESP PANEL BY RT-PCR (FLU A&B, COVID) ARPGX2  CULTURE, BLOOD (ROUTINE X 2)  CULTURE, BLOOD (ROUTINE X 2)  URINE CULTURE  PROTIME-INR  APTT  LACTIC ACID, PLASMA  CBC WITH DIFFERENTIAL/PLATELET  CBC WITH DIFFERENTIAL/PLATELET    EKG EKG Interpretation  Date/Time:  Sunday October 01 2020 10:13:11 EDT Ventricular Rate:  128 PR Interval:  133 QRS Duration: 88 QT Interval:  304 QTC Calculation: 444 R Axis:   78 Text Interpretation: Sinus tachycardia Probable left atrial enlargement RSR' in V1 or V2, right VCD or RVH Nonspecific T abnormalities, inferior leads Sinus tachycardia Confirmed by Coralee PesaHorton, Alie Hardgrove [phone] on 09/20/2020 10:56:34 AM  Radiology DG Chest Port 1 View  Result Date: 10/09/2020 CLINICAL DATA:  Possible sepsis.  Vomiting.  Hypoxia. EXAM: PORTABLE CHEST 1 VIEW COMPARISON:  01/17/2020 FINDINGS: Tracheostomy tube in adequate position. Lungs are adequately inflated without focal airspace consolidation or effusion. Cardiomediastinal silhouette and  remainder of the exam is unchanged. IMPRESSION: No acute cardiopulmonary disease. Electronically Signed   By: Elberta Fortisaniel  Boyle M.D.   On: 10/19/2020 10:45    Procedures .Critical Care  Date/Time: 09/26/2020 2:16 PM Performed  by: Rozelle Logan, DO Authorized by: Rozelle Logan, DO   Critical care provider statement:    Critical care time (minutes):  45   Critical care was necessary to treat or prevent imminent or life-threatening deterioration of the following conditions:  Sepsis   Critical care was time spent personally by me on the following activities:  Discussions with consultants, evaluation of patient's response to treatment, examination of patient, ordering and performing treatments and interventions, ordering and review of laboratory studies, ordering and review of radiographic studies, pulse oximetry, re-evaluation of patient's condition, obtaining history from patient or surrogate and review of old charts   Medications Ordered in ED Medications  lactated ringers infusion ( Intravenous New Bag/Given 10/11/2020 1014)  ceFEPIme (MAXIPIME) 2 g in sodium chloride 0.9 % 100 mL IVPB (2 g Intravenous New Bag/Given 09/21/2020 1301)  metroNIDAZOLE (FLAGYL) IVPB 500 mg (has no administration in time range)  vancomycin (VANCOREADY) IVPB 2000 mg/400 mL (has no administration in time range)  sodium chloride 0.9 % bolus 1,000 mL (0 mLs Intravenous Stopped 09/29/2020 1118)    And  sodium chloride 0.9 % bolus 1,000 mL (1,000 mLs Intravenous New Bag/Given 10/03/2020 1230)    And  sodium chloride 0.9 % bolus 1,000 mL (1,000 mLs Intravenous Bolus 10/05/2020 1306)  ipratropium-albuterol (DUONEB) 0.5-2.5 (3) MG/3ML nebulizer solution 3 mL (3 mLs Nebulization Given 10/12/2020 1132)    ED Course  I have reviewed the triage vital signs and the nursing notes.  Pertinent labs & imaging results that were available during my care of the patient were reviewed by me and considered in my medical decision making (see chart  for details).    MDM Rules/Calculators/A&P                          65 year old nonverbal chronic trach patient presents emergency department with concern for trach secretions and hypoxia.  Also possible concern for vomiting.  She is tachycardic, tachypneic and requiring increased supplemental oxygen on arrival, concern for SIRS criteria.  Septic work-up started.  Blood work shows mild leukocytosis, elevated lactic over 3, chest x-ray at this time looks clear, urinalysis does not look infected, abdomen has been soft, no further emesis.  She continues to have a lot of trach secretions that require suctioning, after breathing treatments her respirations are improved.  Have ordered broad spectrum antibiotics to cover for possible aspiration pneumonia.  Flu and COVID is negative.  D-dimer was elevated, however with the increased trach secretions and signs of infection/sepsis I feel that this is the cause of her hypoxia and abnormal vital signs versus a PE.  However she is also high risk for PE.  She has a severe contrast allergy so a CT PE study is not ideal at this point, may consider a VQ scan.  We will consult with the admitting team.  She will require admission regardless.  Patients evaluation and results requires admission for further treatment and care. Patient agrees with admission plan, offers no new complaints and is stable/unchanged at time of admit.   Final Clinical Impression(s) / ED Diagnoses Final diagnoses:  None    Rx / DC Orders ED Discharge Orders     None        Rozelle Logan, DO 10/05/2020 1416

## 2020-10-01 NOTE — H&P (Addendum)
History and Physical    Sonia Drake XFG:[id number] DOB: August 13, 1955 DOA: 09/22/2020  PCP: Eloisa Northern, MD   Consultants:  daughter unsure specialists. Does see cardiology Patient coming from: SNF, [facility]  Chief Complaint: respiratory distress   HPI: Sonia Drake is a 65 y.o. female with medical history significant of asthma/COPD, CHF, cardiac arrest with PEA/CPR with chronic respiratory failure on permanent trach with supplemental oxygen at 6L,  type 2 DM, GERD, seizures and severe hypoxic-ischemic encephalopathy who presented to ER via EMS for respiratory distress from SNF.   History obtained from ER physician and daughter as patient does not speak/respond. Also called SNF to verify home oxygen at 6L. Daughter tells me that the SNF states that it appeared that she may have thrown up as they suctioned her out and it looked like vomitus and she had copious secretions. Concerns for increased shortness of breath and hypoxia. After suctioned out found to be hypoxic and required increased amount of oxygen. EMS was called. She is at her baseline mental status. Oxygen was low 80s per the facility, after increasing to 8L she was at 98%. She is at her baseline mental status today. ROS can not be completed. They deny any recent fever or out of the usual behavior.    ED Course: blood pressure: 125/68, HR: 120, afebrile, rr: 26 and oxygen 100% on 8L collar (baseline 6L)  3L IV bolus, cefepime, flagyl and vanc given in ER. Duoneb. CXR with no acute findings and urine not impressive. WBC to 11.0.,  d-dimer of 1.47 and initial lactic acid of 3.6. called to admit for sepsis.   Review of Systems: can not obtain.   Ambulatory Status: bed bound     COVID Vaccine Status:  not vaccinated.   Past Medical History:  Diagnosis Date   Arthritis    "right knee; left hip" (04/07/2017)   Asthma    CHF (congestive heart failure) (HCC)    reported treatment for fluid overload in the setting of asthma  exacerbation ~ 1998   COPD (chronic obstructive pulmonary disease) (HCC)    patient states she does not have COPD but was treated for it.    Depression    "from chemical imbalance" (04/07/2017)   Dyspnea    Dysrhythmia    occasional skipped beats   GERD (gastroesophageal reflux disease)    On home oxygen therapy    "I sleep with 2L" (04/07/2017)   Pneumonia    "several times" (04/07/2017)   Seizures (HCC) 2003 X 1   unknown cause    Past Surgical History:  Procedure Laterality Date   JOINT REPLACEMENT     KNEE ARTHROSCOPY Right 2017   TOTAL HIP ARTHROPLASTY Left 04/07/2017   TOTAL HIP ARTHROPLASTY Left 04/07/2017   Procedure: LEFT TOTAL HIP ARTHROPLASTY ANTERIOR APPROACH;  Surgeon: Gean Birchwood, MD;  Location: [facility] OR;  Service: Orthopedics;  Laterality: Left;    Social History   Socioeconomic History   Marital status: Single    Spouse name: Not on file   Number of children: 2   Years of education: Not on file   Highest education level: Not on file  Occupational History   Occupation: Disabled.  Tobacco Use   Smoking status: Never   Smokeless tobacco: Never  Vaping Use   Vaping Use: Never used  Substance and Sexual Activity   Alcohol use: No   Drug use: No   Sexual activity: Not on file  Other Topics Concern   Not  on file  Social History Narrative   Single.  Lives with daughter.  Independent of ADLs.   Social Determinants of Health   Financial Resource Strain: Not on file  Food Insecurity: Not on file  Transportation Needs: Not on file  Physical Activity: Not on file  Stress: Not on file  Social Connections: Not on file  Intimate Partner Violence: Not on file    Allergies  Allergen Reactions   Contrast Media [Iodinated Diagnostic Agents] Anaphylaxis and Other (See Comments)    ** CARDIAC ARREST **   Peanuts [Peanut Oil] Shortness Of Breath and Swelling   Shellfish Allergy Shortness Of Breath and Swelling   Soap Shortness Of Breath and Swelling     LAUNDRY DETERGENT   Latex Hives   Adenosine    Eggs Or Egg-Derived Products Rash    Family History  Problem Relation Age of Onset   Heart disease Mother    Heart disease Father    Diabetes Father    Asthma Father    Diabetes Brother    Diabetes Sister    Asthma Brother    Asthma Sister    Hypertension Sister    Heart failure Brother    Hypertension Brother    Diabetes Brother    Heart failure Brother    Hypertension Brother    Diabetes Brother    Hypertension Brother    Hyperlipidemia Brother     Prior to Admission medications   Medication Sig Start Date End Date Taking? Authorizing Provider  budesonide (PULMICORT) 0.5 MG/2ML nebulizer solution Take 0.5 mg by nebulization 2 (two) times daily.   Yes [provider]  chlorhexidine (PERIDEX) 0.12 % solution Use as directed 15 mLs in the mouth or throat 2 (two) times daily.   Yes [provider]  clonazePAM (KLONOPIN) 0.5 MG tablet Place 0.5 mg into feeding tube 2 (two) times daily.   Yes [provider]  clopidogrel (PLAVIX) 75 MG tablet Place 75 mg into feeding tube daily.   Yes [provider]  famotidine (PEPCID) 20 MG tablet Take 20 mg by mouth 2 (two) times daily.   Yes [provider]  furosemide (LASIX) 40 MG tablet Place 40 mg into feeding tube daily.   Yes [provider]  insulin glargine (LANTUS) 100 UNIT/ML injection Inject 40 Units into the skin daily.   Yes [provider]  insulin lispro (HUMALOG) 100 UNIT/ML injection Inject 2-12 Units into the skin See admin instructions. Inject as per sliding scale: 0-150 = 0 units, 151-200 = 2 units, 201-250 = 4 units, 251- 300 = 6 units, 301- 350 = 8 units, 351- 400 = 10 units, 401+ = 12 units.   Yes [provider]  ipratropium-albuterol (DUONEB) 0.5-2.5 (3) MG/3ML SOLN Inhale 3 mLs into the lungs every 6 (six) hours as needed (SOB, wheezing).  08/21/19  Yes [provider]  levETIRAcetam (KEPPRA)  100 MG/ML solution Take 2,500 mg by mouth 2 (two) times daily.   Yes [provider]  metoprolol tartrate (LOPRESSOR) 25 MG tablet Take 25 mg by mouth 2 (two) times daily.   Yes [provider]  montelukast (SINGULAIR) 10 MG tablet Take 1 tablet (10 mg total) by mouth daily. Patient taking differently: Place 10 mg into feeding tube daily. 04/14/18  Yes Emokpae, Courage, MD  Potassium Bicarb-Citric Acid 20 MEQ TBEF Place 1 tablet into feeding tube daily.   Yes [provider]  senna-docusate (SENOKOT-S) 8.6-50 MG tablet Take 1 tablet by mouth  at bedtime. Patient taking differently: Place 1 tablet into feeding tube in the morning. 08/30/19  Yes Sheikh, Omair Latif, DO  albuterol (PROVENTIL HFA;VENTOLIN HFA) 108 (90 Base) MCG/ACT inhaler Inhale 2 puffs into the lungs every 6 (six) hours as needed for wheezing or shortness of breath. Patient not taking: Reported on 14-Oct-2020 04/14/18   Shon Hale, MD  albuterol (PROVENTIL) (2.5 MG/3ML) 0.083% nebulizer solution Take 3 mLs (2.5 mg total) by nebulization every 6 (six) hours as needed for wheezing or shortness of breath. Patient not taking: Reported on 10/14/20 01/30/19   Rhetta Mura, MD  budesonide-formoterol [facility]) 160-4.5 MCG/ACT inhaler Inhale 2 puffs into the lungs 2 (two) times daily. Patient not taking: No sig reported 04/14/18   Shon Hale, MD  calcium-vitamin D (OSCAL WITH D) 500-200 MG-UNIT tablet Take 1 tablet by mouth daily with breakfast. Patient not taking: No sig reported 04/14/18   Shon Hale, MD  furosemide (LASIX) 20 MG tablet Take 1 tablet (20 mg total) by mouth daily. 08/30/19 09/29/19  Merlene Laughter, DO    Physical Exam: Vitals:   10/14/2020 1600 October 14, 2020 1614 10/14/2020 1620 10/14/20 1640  BP: (!) 144/95  (!) 132/115 133/87  Pulse: (!) 119 (!) 117 (!) 116 (!) 114  Resp: (!) 30 (!) 25 (!) 27 (!) 28  Temp:      TempSrc:      SpO2: 100% 100% 100% 99%     General:   Appears calm and comfortable and is in NAD Eyes:  limited exam.  ENT:   lips & tongue, mmm; missing teeth  Neck:  no LAD, masses or thyromegaly; no carotid bruits Cardiovascular:  sinus tachycardia, no m/r/g. No LE edema.  Respiratory:   secretions heard, tachypneic, increased work of breathing Abdomen:  soft, NT, ND, NABS Back:   normal alignment, limited exam  Skin:  no rash or induration seen on limited exam Musculoskeletal:  hands contracted. Myclonus.  Lower extremity:  No LE edema.  Limited foot exam with no ulcerations.  2+ distal pulses. Psychiatric:  at baseline.  Neurologic: at baseline.     Radiological Exams on Admission: Independently reviewed - see discussion in A/P where applicable  DG Chest Port 1 View  Result Date: 10-14-2020 CLINICAL DATA:  Possible sepsis.  Vomiting.  Hypoxia. EXAM: PORTABLE CHEST 1 VIEW COMPARISON:  01/17/2020 FINDINGS: Tracheostomy tube in adequate position. Lungs are adequately inflated without focal airspace consolidation or effusion. Cardiomediastinal silhouette and remainder of the exam is unchanged. IMPRESSION: No acute cardiopulmonary disease. Electronically Signed   By: Elberta Fortis M.D.   On: October 14, 2020 10:45    EKG: Independently reviewed.  Sinus tachycardia with rate 128; nonspecific ST changes with no evidence of acute ischemia   Labs on Admission: I have personally reviewed the available labs and imaging studies at the time of the admission.  Pertinent labs:  WBC: 11.0 D-dimer: 1.47 Potassium: 5.2 Glucose: 202 Ast: 50 Lactic acid: 3.6 Covid and flu negative  Assessment/Plan Principal Problem:   Sepsis (HCC) -code sepsis activated in ER -bolused with IVF and continue at 150cc/hour -broad spectrum antibiotics initiated, suspect aspiration pneumonia with history. Urine does not look infected.  -2V CXR in the AM -cultures pending  -repeat lactic acid 2.1  Active Problems:   Acute on chronic respiratory failure with hypoxia  (HCC) -hypoxic to mid 80s at SNF with increased need for oxygen to 8L -maintaining saturations, but has been working hard breathing with rates 28-30 and tachycardia so consulted pulmonology.  -VQ  scan ordered for elevated d-dimer, tachycardia and acute hypoxia. Sepsis still likely reason for elevated d-dimer and change in respiratory status.      COPD (chronic obstructive pulmonary disease) (HCC) -continue home nebs and prn  -per pulm scheduled duoNeb for next 24 hours to q 6 hours -solu medtrol 40mg  x 5 days    Chronic diastolic CHF (congestive heart failure) (HCC) - received aggressive IVF hydration here -last echo 03/2020 with EF of 60-65%. Normal left diastolic parameters.  -bnp: 21.5 -strict I/O -weights -watch fluid balance in light of aggressive hydration -continue low dose beta blocker. Pressures have been normal in ER, if any hypotension can hold.  -continue daily lasix.     Type 2 diabetes mellitus without complication, with long-term current use of insulin (HCC) -a1c pending -continue home lantus and SSI -accuchecks and hypoglycemic protocol -diet consult for tube feeds    GERD (gastroesophageal reflux disease) Continue pepcid BID    Seizures (HCC) Continue keppra BID Keppra level in AM -seizure precautions  Severe hypoxic-ischemic encephalopathy -per family/SNF at baseline  Tracheostomy -RT consulted   Hyperkalemia Held potassium today. Ordered to start tomorrow.  -repeat BMP in am     There is no height or weight on file to calculate BMI.  Note: This patient has been tested and is negative for the novel coronavirus COVID-19.   Level of care: Progressive DVT prophylaxis:  Lovenox  Code Status:  Full - confirmed with family Family Communication: None present; I spoke with the patient's daughter by telephone at the time of admission. Thom Chimesshanti Nordin:: [phone] Disposition Plan:  The patient is from: SNF   Patient is currently: acutely  ill Consults called: pulmonology discussed with Dr. Tonia BroomsIcard.  Admission status:  inpatient    Orland MustardAllison Shaunice Levitan MD [facility]   How to contact the [facility] Attending or Consulting provider 7A - 7P or covering provider during after hours 7P -7A, for this patient?  Check the care team in [facility] and look for a) attending/consulting [facility] provider listed and b) the [facility] team listed Log into [email or web address] and use [facility] universal password to access. If you do not have the password, please contact the hospital operator. Locate the [facility] provider you are looking for under [facility] and page to a number that you can be directly reached. If you still have difficulty reaching the provider, please page the [facility] (Director on Call) for the Hospitalists listed on amion for assistance.   10/10/2020, 4:48 PM

## 2020-10-01 NOTE — Consult Note (Addendum)
NAME:  Sonia HummerMary J Drake, MRN:  [id number], DOB:  23-Oct-1955, LOS: 0 ADMISSION DATE:  09-14-2020, CONSULTATION DATE:  09-14-2020 REFERRING MD:  Orland MustardWolfe, Allison, MD, CHIEF COMPLAINT:  respiratory failure, vent management   History of Present Illness:  Sonia Drake is a 65 year old woman with history of cardiac arrest with anoxic brain injury status post tracheostomy in August 2021 who [facility].  She is on trach collar 6 L with humidification daily.  She was discharged to [facility] initially and then transferred to skilled nursing facility after ventilator liberation.  At baseline she has bedbound, nonverbal.  [facility] and possible [facility].  There were copious secretions suctioned from her tracheostomy.  [facility].  Here she is found to be tachycardic, tachypneic, and having worsening hypoxemia.  Chest x-ray is obtained and shows no acute cardiopulmonary process.  She has been evaluated for sepsis and being ruled out for infection.  Pulmonary's been consulted to assist with respiratory failure and trach management in the setting of sepsis.  Pertinent  Medical History  COPD Seizure disorder Type 2 DM  Significant Drake Events: Including procedures, antibiotic start and stop dates in addition to other pertinent events   6/12 admitted from skilled nursing facility after aspiration event and copious secretions per tracheostomy  Interim History / Subjective:    Objective   Blood pressure 133/87, pulse (!) 114, temperature 98.6 F (37 C), temperature source Axillary, resp. rate (!) 28, SpO2 99 %.    FiO2 (%):  [35 %-40 %] 35 %   Intake/Output Summary (Last 24 hours) at 09-14-2020 1712 Last data filed at 09-14-2020 1550 Gross per 24 hour  Intake 3600 ml  Output --  Net 3600 ml   There were no vitals  filed for this visit.  Examination: General: Chronically ill-appearing, tachypneic HENT: 6 oh cuffless Shiley trach with trach collar at 8 L Lungs: Tachypneic, mild end expiratory wheezing, no rhonchi, minimal secretions Cardiovascular: Tachycardic, regular, no murmurs rubs gallops Abdomen: Soft, nontender Extremities: No edema Neuro: Nonverbal, does not respond to verbal or tactile stimuli, she does not withdraw to pain MSK: No rashes  Labs/imaging that I havepersonally reviewed  (right click and "Reselect all SmartList Selections" daily)  Chest xray 6/12 clear, no acute process. Trach in place WBC elevatd at 11 Hgb 11.7 UA without evidence of infection K 5.2 Cr 0.57 Influenza A and COVID-negative  Resolved Drake Problem list     Assessment & Plan:   Sonia HummerMary J Gootee is a 65 y.o. woman with history of chronic respiratory failure s/p tracheostomy after PEA arrest. She is here from SNF for worsening shortness of breath and possible aspiration event. PCCM consulted for respiratory failure/vent management.  Acute on chronic hypoxemic respiratory failure S/p Tracheostomy Asthma COPD overlap syndrome with exacerbation Aspiration Pneumonitis Possible sepsis Anoxic encephalopathy s/p cardiac arrest  Agree with empiric antibiotics for the time being.  Her chest x-ray is not consistent with a focal lobar pneumonia however can follow-up on the cultures drawn from blood.  Does not believe she is a UTI.  She is wheezing on exam and I think bronchodilators would be useful for her.  I do not think she needs to be put on the vent at this time I think is appropriate for PCU admission.  It sounds like she had an aspiration event which is causing  exacerbation of her COPD.  We will do Solu-Medrol 40 mg for 5 days and scheduled DuoNeb for the next 24 hours to every 6 hours.  PCCM will continue to follow.  Please contact Sonia Drake for questions.  [name], MD Pulmonary and Critical Care  Medicine [facility]    Labs   CBC: Recent Labs  Lab 10/12/2020 1248  WBC 11.0*  NEUTROABS 8.0*  HGB 11.7*  HCT 38.5  MCV 87.1  PLT 328    Basic Metabolic Panel: Recent Labs  Lab 10/17/2020 1127  NA 137  K 5.2*  CL 100  CO2 27  GLUCOSE 202*  BUN 16  CREATININE 0.57  CALCIUM 10.0   GFR: CrCl cannot be calculated (Unknown ideal weight.). Recent Labs  Lab 09/29/2020 1126 10/18/2020 1248 10/17/2020 1532  WBC  --  11.0*  --   LATICACIDVEN 3.6*  --  2.1*    Liver Function Tests: Recent Labs  Lab 10/09/2020 1127  AST 50*  ALT 28  ALKPHOS 112  BILITOT 0.8  PROT 6.7  ALBUMIN 3.0*   No results for input(s): LIPASE, AMYLASE in the last 168 hours. No results for input(s): AMMONIA in the last 168 hours.  ABG    Component Value Date/Time   PHART 7.280 (L) 01/27/2019 0815   PCO2ART 80.0 (HH) 01/27/2019 0815   PO2ART 83.2 01/27/2019 0815   HCO3 36.4 (H) 01/27/2019 0815   TCO2 24.2 07/21/2015 2235   O2SAT 96.5 01/27/2019 0815     Coagulation Profile: Recent Labs  Lab 09/24/2020 1127  INR 1.0    Cardiac Enzymes: No results for input(s): CKTOTAL, CKMB, CKMBINDEX, TROPONINI in the last 168 hours.  HbA1C: Hgb A1c MFr Bld  Date/Time Value Ref Range Status  08/30/2019 04:53 AM 6.5 (H) 4.8 - 5.6 % Final    Comment:    (NOTE) Pre diabetes:          5.7%-6.4% Diabetes:              >6.4% Glycemic control for   <7.0% adults with diabetes   08/29/2019 05:10 AM 6.4 (H) 4.8 - 5.6 % Final    Comment:    (NOTE) Pre diabetes:          5.7%-6.4% Diabetes:              >6.4% Glycemic control for   <7.0% adults with diabetes     CBG: Recent Labs  Lab 09/29/2020 1640  GLUCAP 105*    Review of Systems:   Unable to obtain due to patient condition  Past Medical History:  She,  has a past medical history of Arthritis, Asthma, CHF (congestive heart failure) (HCC), COPD (chronic obstructive pulmonary disease) (HCC), Depression, Dyspnea, Dysrhythmia, GERD  (gastroesophageal reflux disease), On home oxygen therapy, Pneumonia, and Seizures (HCC) (2003 X 1).   Surgical History:   Past Surgical History:  Procedure Laterality Date   JOINT REPLACEMENT     KNEE ARTHROSCOPY Right 2017   TOTAL HIP ARTHROPLASTY Left 04/07/2017   TOTAL HIP ARTHROPLASTY Left 04/07/2017   Procedure: LEFT TOTAL HIP ARTHROPLASTY ANTERIOR APPROACH;  Surgeon: Gean Birchwood, MD;  Location: [facility] OR;  Service: Orthopedics;  Laterality: Left;     Social History:   reports that she has never smoked. She has never used smokeless tobacco. She reports that she does not drink alcohol and does not use drugs.   Family History:  Her family history includes Asthma in her brother, father, and sister; Diabetes in her brother, brother,  brother, father, and sister; Heart disease in her father and mother; Heart failure in her brother and brother; Hyperlipidemia in her brother; Hypertension in her brother, brother, brother, and sister.   Allergies Allergies  Allergen Reactions   Contrast Media [Iodinated Diagnostic Agents] Anaphylaxis and Other (See Comments)    ** CARDIAC ARREST **   Peanuts [Peanut Oil] Shortness Of Breath and Swelling   Shellfish Allergy Shortness Of Breath and Swelling   Soap Shortness Of Breath and Swelling    LAUNDRY DETERGENT   Latex Hives   Adenosine    Eggs Or Egg-Derived Products Rash     Home Medications  Prior to Admission medications   Medication Sig Start Date End Date Taking? Authorizing Provider  budesonide (PULMICORT) 0.5 MG/2ML nebulizer solution Take 0.5 mg by nebulization 2 (two) times daily.   Yes [provider]  chlorhexidine (PERIDEX) 0.12 % solution Use as directed 15 mLs in the mouth or throat 2 (two) times daily.   Yes [provider]  clonazePAM (KLONOPIN) 0.5 MG tablet Place 0.5 mg into feeding tube 2 (two) times daily.   Yes [provider]  clopidogrel (PLAVIX) 75 MG tablet Place 75 mg into feeding tube daily.    Yes [provider]  famotidine (PEPCID) 20 MG tablet Take 20 mg by mouth 2 (two) times daily.   Yes [provider]  furosemide (LASIX) 40 MG tablet Place 40 mg into feeding tube daily.   Yes [provider]  insulin glargine (LANTUS) 100 UNIT/ML injection Inject 40 Units into the skin daily.   Yes [provider]  insulin lispro (HUMALOG) 100 UNIT/ML injection Inject 2-12 Units into the skin See admin instructions. Inject as per sliding scale: 0-150 = 0 units, 151-200 = 2 units, 201-250 = 4 units, 251- 300 = 6 units, 301- 350 = 8 units, 351- 400 = 10 units, 401+ = 12 units.   Yes [provider]  ipratropium-albuterol (DUONEB) 0.5-2.5 (3) MG/3ML SOLN Inhale 3 mLs into the lungs every 6 (six) hours as needed (SOB, wheezing).  08/21/19  Yes [provider]  levETIRAcetam (KEPPRA) 100 MG/ML solution Take 2,500 mg by mouth 2 (two) times daily.   Yes [provider]  metoprolol tartrate (LOPRESSOR) 25 MG tablet Take 25 mg by mouth 2 (two) times daily.   Yes [provider]  montelukast (SINGULAIR) 10 MG tablet Take 1 tablet (10 mg total) by mouth daily. Patient taking differently: Place 10 mg into feeding tube daily. 04/14/18  Yes Emokpae, Courage, MD  Potassium Bicarb-Citric Acid 20 MEQ TBEF Place 1 tablet into feeding tube daily.   Yes [provider]  senna-docusate (SENOKOT-S) 8.6-50 MG tablet Take 1 tablet by mouth at bedtime. Patient taking differently: Place 1 tablet into feeding tube in the morning. 08/30/19  Yes Sheikh, Omair Latif, DO  albuterol (PROVENTIL HFA;VENTOLIN HFA) 108 (90 Base) MCG/ACT inhaler Inhale 2 puffs into the lungs every 6 (six) hours as needed for wheezing or shortness of breath. Patient not taking: Reported on 10/25/20 04/14/18   Shon Hale, MD  albuterol (PROVENTIL) (2.5 MG/3ML) 0.083% nebulizer solution Take 3 mLs (2.5 mg total) by nebulization every 6 (six) hours as needed for wheezing  or shortness of breath. Patient not taking: Reported on 2020/10/25 01/30/19   Rhetta Mura, MD  budesonide-formoterol [facility]) 160-4.5 MCG/ACT inhaler Inhale 2 puffs into the lungs 2 (two) times daily. Patient not taking: No sig reported 04/14/18   Shon Hale, MD  calcium-vitamin D [place]  WITH D) 500-200 MG-UNIT tablet Take 1 tablet by mouth daily with breakfast. Patient not taking: No sig reported 04/14/18   Shon Hale, MD  furosemide (LASIX) 20 MG tablet Take 1 tablet (20 mg total) by mouth daily. 08/30/19 09/29/19  Merlene Laughter, DO

## 2020-10-01 NOTE — ED Notes (Signed)
RT notified of MD order for neb treatments and suction. RT suctioned pt 10-56mins ago with mild amount of secretions noted. Family is requesting suction again. RT notified. O2 at 98-100% on the monitor.

## 2020-10-02 ENCOUNTER — Inpatient Hospital Stay (HOSPITAL_COMMUNITY): Payer: Medicare Other

## 2020-10-02 DIAGNOSIS — J449 Chronic obstructive pulmonary disease, unspecified: Secondary | ICD-10-CM | POA: Diagnosis not present

## 2020-10-02 DIAGNOSIS — Z7189 Other specified counseling: Secondary | ICD-10-CM | POA: Diagnosis not present

## 2020-10-02 DIAGNOSIS — J9621 Acute and chronic respiratory failure with hypoxia: Secondary | ICD-10-CM | POA: Diagnosis not present

## 2020-10-02 DIAGNOSIS — J4551 Severe persistent asthma with (acute) exacerbation: Secondary | ICD-10-CM

## 2020-10-02 DIAGNOSIS — Z515 Encounter for palliative care: Secondary | ICD-10-CM | POA: Diagnosis not present

## 2020-10-02 DIAGNOSIS — A419 Sepsis, unspecified organism: Secondary | ICD-10-CM

## 2020-10-02 DIAGNOSIS — Z930 Tracheostomy status: Secondary | ICD-10-CM

## 2020-10-02 DIAGNOSIS — Z789 Other specified health status: Secondary | ICD-10-CM | POA: Diagnosis not present

## 2020-10-02 DIAGNOSIS — G931 Anoxic brain damage, not elsewhere classified: Secondary | ICD-10-CM | POA: Diagnosis not present

## 2020-10-02 DIAGNOSIS — L8990 Pressure ulcer of unspecified site, unspecified stage: Secondary | ICD-10-CM | POA: Diagnosis present

## 2020-10-02 DIAGNOSIS — R569 Unspecified convulsions: Secondary | ICD-10-CM | POA: Diagnosis not present

## 2020-10-02 LAB — COMPREHENSIVE METABOLIC PANEL
ALT: 21 U/L (ref 0–44)
AST: 18 U/L (ref 15–41)
Albumin: 2.6 g/dL — ABNORMAL LOW (ref 3.5–5.0)
Alkaline Phosphatase: 94 U/L (ref 38–126)
Anion gap: 7 (ref 5–15)
BUN: 9 mg/dL (ref 8–23)
CO2: 28 mmol/L (ref 22–32)
Calcium: 10.1 mg/dL (ref 8.9–10.3)
Chloride: 104 mmol/L (ref 98–111)
Creatinine, Ser: 0.45 mg/dL (ref 0.44–1.00)
GFR, Estimated: >60 mL/min (ref >60)
Glucose, Bld: 91 mg/dL (ref 70–99)
Potassium: 3.5 mmol/L (ref 3.5–5.1)
Sodium: 139 mmol/L (ref 135–145)
Total Bilirubin: 0.3 mg/dL (ref 0.3–1.2)
Total Protein: 6.1 g/dL — ABNORMAL LOW (ref 6.5–8.1)

## 2020-10-02 LAB — CBC
HCT: 32.8 % — ABNORMAL LOW (ref 36.0–46.0)
Hemoglobin: 10.4 g/dL — ABNORMAL LOW (ref 12.0–15.0)
MCH: 26.9 pg (ref 26.0–34.0)
MCHC: 31.7 g/dL (ref 30.0–36.0)
MCV: 85 fL (ref 80.0–100.0)
Platelets: 281 10*3/uL (ref 150–400)
RBC: 3.86 MIL/uL — ABNORMAL LOW (ref 3.87–5.11)
RDW: 15 % (ref 11.5–15.5)
WBC: 10.7 10*3/uL — ABNORMAL HIGH (ref 4.0–10.5)
nRBC: 0 % (ref 0.0–0.2)

## 2020-10-02 LAB — BLOOD CULTURE ID PANEL (REFLEXED) - BCID2

## 2020-10-02 LAB — C-REACTIVE PROTEIN: CRP: 6.9 mg/dL — ABNORMAL HIGH (ref <1.0)

## 2020-10-02 LAB — URINE CULTURE

## 2020-10-02 LAB — GLUCOSE, CAPILLARY
Glucose-Capillary: 91 mg/dL (ref 70–99)
Glucose-Capillary: 125 mg/dL — ABNORMAL HIGH (ref 70–99)
Glucose-Capillary: 149 mg/dL — ABNORMAL HIGH (ref 70–99)
Glucose-Capillary: 179 mg/dL — ABNORMAL HIGH (ref 70–99)

## 2020-10-02 LAB — HEMOGLOBIN A1C
Hgb A1c MFr Bld: 7 % — ABNORMAL HIGH (ref 4.8–5.6)
Mean Plasma Glucose: 154 mg/dL

## 2020-10-02 LAB — BRAIN NATRIURETIC PEPTIDE: B Natriuretic Peptide: 63.9 pg/mL (ref 0.0–100.0)

## 2020-10-02 LAB — MRSA NEXT GEN BY PCR, NASAL: MRSA by PCR Next Gen: DETECTED — AB

## 2020-10-02 LAB — MAGNESIUM: Magnesium: 1.7 mg/dL (ref 1.7–2.4)

## 2020-10-02 LAB — PROCALCITONIN: Procalcitonin: <0.1 ng/mL

## 2020-10-02 MED ORDER — METHYLPREDNISOLONE SODIUM SUCC 40 MG IJ SOLR: 40.0000 mg | Freq: Two times a day (BID) | INTRAMUSCULAR | Status: DC

## 2020-10-02 MED ORDER — POTASSIUM CHLORIDE 20 MEQ PO PACK
40.0000 meq | PACK | Freq: Once | ORAL | Status: AC
  Administered 2020-10-02: 40 meq
  Filled 2020-10-02: qty 2

## 2020-10-02 MED ORDER — LORATADINE 10 MG PO TABS
10.0000 mg | ORAL_TABLET | Freq: Every day | ORAL | Status: DC
  Administered 2020-10-02 – 2021-01-27 (×118): 10 mg
  Filled 2020-10-02 (×118): qty 1

## 2020-10-02 MED ORDER — LACTATED RINGERS IV SOLN: INTRAVENOUS | Status: AC

## 2020-10-02 MED ORDER — PANTOPRAZOLE SODIUM 40 MG IV SOLR
40.0000 mg | INTRAVENOUS | Status: DC
  Administered 2020-10-02 – 2020-10-04 (×3): 40 mg via INTRAVENOUS
  Filled 2020-10-02 (×3): qty 40

## 2020-10-02 MED ORDER — ENOXAPARIN SODIUM 60 MG/0.6ML IJ SOSY
0.5000 mg/kg | PREFILLED_SYRINGE | INTRAMUSCULAR | Status: DC
  Administered 2020-10-02 – 2021-01-26 (×117): 45 mg via SUBCUTANEOUS
  Filled 2020-10-02 (×5): qty 0.6
  Filled 2020-10-02: qty 0.45
  Filled 2020-10-02 (×15): qty 0.6
  Filled 2020-10-02: qty 0.45
  Filled 2020-10-02 (×3): qty 0.6
  Filled 2020-10-02: qty 0.45
  Filled 2020-10-02 (×9): qty 0.6
  Filled 2020-10-02: qty 0.45
  Filled 2020-10-02 (×6): qty 0.6
  Filled 2020-10-02: qty 0.45
  Filled 2020-10-02 (×2): qty 0.6
  Filled 2020-10-02: qty 0.45
  Filled 2020-10-02: qty 0.6
  Filled 2020-10-02: qty 0.45
  Filled 2020-10-02 (×4): qty 0.6
  Filled 2020-10-02: qty 0.45
  Filled 2020-10-02 (×3): qty 0.6
  Filled 2020-10-02: qty 0.45
  Filled 2020-10-02 (×4): qty 0.6
  Filled 2020-10-02: qty 0.45
  Filled 2020-10-02 (×6): qty 0.6
  Filled 2020-10-02: qty 0.45
  Filled 2020-10-02: qty 0.6
  Filled 2020-10-02 (×2): qty 0.45
  Filled 2020-10-02 (×5): qty 0.6
  Filled 2020-10-02: qty 0.45
  Filled 2020-10-02 (×3): qty 0.6
  Filled 2020-10-02: qty 0.45
  Filled 2020-10-02 (×6): qty 0.6
  Filled 2020-10-02 (×2): qty 0.45
  Filled 2020-10-02 (×4): qty 0.6
  Filled 2020-10-02: qty 0.45
  Filled 2020-10-02 (×3): qty 0.6
  Filled 2020-10-02 (×2): qty 0.45
  Filled 2020-10-02: qty 0.6
  Filled 2020-10-02 (×2): qty 0.45
  Filled 2020-10-02: qty 0.6
  Filled 2020-10-02 (×2): qty 0.45
  Filled 2020-10-02: qty 0.6
  Filled 2020-10-02 (×2): qty 0.45
  Filled 2020-10-02 (×2): qty 0.6
  Filled 2020-10-02: qty 0.45
  Filled 2020-10-02: qty 0.6
  Filled 2020-10-02 (×2): qty 0.45
  Filled 2020-10-02 (×3): qty 0.6

## 2020-10-02 MED ORDER — OSMOLITE 1.2 CAL PO LIQD: 1000.0000 mL | ORAL | Status: DC

## 2020-10-02 MED ORDER — ALBUTEROL SULFATE (2.5 MG/3ML) 0.083% IN NEBU
2.5000 mg | INHALATION_SOLUTION | RESPIRATORY_TRACT | Status: DC | PRN
  Administered 2020-10-02 – 2021-01-26 (×35): 2.5 mg via RESPIRATORY_TRACT
  Filled 2020-10-02 (×37): qty 3

## 2020-10-02 MED ORDER — MAGNESIUM SULFATE IN D5W 1-5 GM/100ML-% IV SOLN
1.0000 g | Freq: Once | INTRAVENOUS | Status: AC
  Administered 2020-10-02: 1 g via INTRAVENOUS
  Filled 2020-10-02: qty 100

## 2020-10-02 MED ORDER — LACTATED RINGERS IV SOLN: INTRAVENOUS | Status: DC

## 2020-10-02 MED ORDER — METHYLPREDNISOLONE SODIUM SUCC 125 MG IJ SOLR
60.0000 mg | Freq: Four times a day (QID) | INTRAMUSCULAR | Status: DC
  Administered 2020-10-02 – 2020-10-06 (×15): 60 mg via INTRAVENOUS
  Filled 2020-10-02 (×15): qty 2

## 2020-10-02 MED ORDER — PROSOURCE TF PO LIQD
45.0000 mL | Freq: Two times a day (BID) | ORAL | Status: DC
  Administered 2020-10-02 – 2021-01-27 (×235): 45 mL
  Filled 2020-10-02 (×234): qty 45

## 2020-10-02 MED ORDER — JEVITY 1.5 CAL/FIBER PO LIQD
1000.0000 mL | ORAL | Status: DC
  Administered 2020-10-02 – 2021-01-26 (×79): 1000 mL
  Filled 2020-10-02 (×142): qty 1000

## 2020-10-02 NOTE — Plan of Care (Signed)
  Problem: Clinical Measurements: Goal: Respiratory complications will improve Outcome: Progressing   Problem: Clinical Measurements: Goal: Cardiovascular complication will be avoided Outcome: Progressing   Problem: Pain Managment: Goal: General experience of comfort will improve Outcome: Progressing   

## 2020-10-02 NOTE — Progress Notes (Signed)
Initial Nutrition Assessment  DOCUMENTATION CODES:   Not applicable  INTERVENTION:   Initiate Jevity 1.5 @ 25 ml/hr via PEG and increase by 10 ml every 4 hours to goal rate of 45 ml/hr.   45 ml Prosource TF BID.    Tube feeding regimen provides 1700 kcal (100% of needs), 91 grams of protein, and 821 ml of H2O.    NUTRITION DIAGNOSIS:   Increased nutrient needs related to wound healing as evidenced by estimated needs.  GOAL:   Patient will meet greater than or equal to 90% of their needs  MONITOR:   Labs, Weight trends, TF tolerance, Skin, I & O's  REASON FOR ASSESSMENT:   Consult Enteral/tube feeding initiation and management  ASSESSMENT:   Sonia Drake is a 65 y.o. female with medical history significant of asthma/COPD, CHF, cardiac arrest with PEA/CPR with chronic respiratory failure on permanent trach with supplemental oxygen at 6L,  type 2 DM, GERD, seizures and severe hypoxic-ischemic encephalopathy who presented to ER via EMS for respiratory distress from SNF.  Pt admitted with sepsis and acute on chronic respiratory failure with hypoxia.   Reviewed I/O's: +4.6 L x 24 hours  UOP: 950 ml x 24 hours   Pt minimally interactive with this RD. Per chart review, pt is non-verbal at baseline.   Pt on trach collar and dependent on PEG for nutrition. Unsure of PTA TF regimen.   No current wt available to assess. Noted pt with edema, which may mask true weight loss as well as fat and muscle depletions.   Medications reviewed and include lasix, keppra, solu-medrol, senna, and lactated ringers infusion @ 75 ml/hr.   Lab Results  Component Value Date   HGBA1C 6.5 (H) 08/30/2019   PTA DM medications are 2-12 units insuln lispro TID and 40 units insulin glargine daily.   Labs reviewed: CBGS: 91 (inpatient orders for glycemic control are 0-15 units insulin aspart TID with emals and 40 units insulin glargine daily).    NUTRITION - FOCUSED PHYSICAL EXAM:  Flowsheet Row  Most Recent Value  Orbital Region No depletion  Upper Arm Region No depletion  Thoracic and Lumbar Region No depletion  Buccal Region No depletion  Temple Region No depletion  Clavicle Bone Region No depletion  Clavicle and Acromion Bone Region No depletion  Scapular Bone Region No depletion  Dorsal Hand No depletion  Patellar Region No depletion  Anterior Thigh Region No depletion  Posterior Calf Region Mild depletion  Edema (RD Assessment) Mild  Hair Reviewed  Eyes Reviewed  Mouth Reviewed  Skin Reviewed  Nails Reviewed       Diet Order:   Diet Order             Diet NPO time specified  Diet effective now                   EDUCATION NEEDS:   No education needs have been identified at this time  Skin:  Skin Assessment: Skin Integrity Issues: Skin Integrity Issues:: Stage II Stage II: buttocks  Last BM:  Unknown  Height:   Ht Readings from Last 1 Encounters:  09/08/19 5' (1.524 m)    Weight:   Wt Readings from Last 1 Encounters:  09/08/19 96.6 kg    Ideal Body Weight:  45.5 kg  BMI:  There is no height or weight on file to calculate BMI.  Estimated Nutritional Needs:   Kcal:  1600-1800  Protein:  90-105 grams  Fluid:  >  1.6 L    Loistine Chance, RD, LDN, [place] Registered Dietitian II Certified Diabetes Care and Education Specialist Please refer to [facility] for RD and/or RD on-call/weekend/after hours pager

## 2020-10-02 NOTE — Progress Notes (Signed)
PROGRESS NOTE                                                                                                                                                                                                             Patient Demographics:    Sonia Drake, is a 65 y.o. female, DOB - March 29, 1956, RUE:[id number]  Outpatient Primary MD for the patient is Sonia Northern, MD    LOS - 1  Admit date - 24-Oct-2020    Chief Complaint  Patient presents with   Respiratory Distress       Brief Narrative (HPI from H&P)  Sonia Drake is a 65 y.o. female with medical history significant of asthma/COPD, CHF, cardiac arrest with PEA/CPR with chronic respiratory failure on permanent trach with supplemental oxygen at 6L,  type 2 DM, GERD, seizures and severe hypoxic-ischemic encephalopathy who presented to ER via EMS for respiratory distress from SNF.   Subjective:    Sonia Drake today main symptom bed trached and pegged, baseline anoxic brain injury and unresponsive, unable to answer questions or follow commands.   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  Now suspicious for sepsis due to aspiration pneumonia causing gram-positive bacteremia 2 out of 2 with staph aureus, currently on broad-spectrum antibiotics and sepsis pathophysiology has resolved, PCCM is following.  Long-term prognosis is extremely poor due to underlying anoxic brain injury and poor functional status, will continue supportive care and monitor.  Family wants aggressive measures for now.  Note at baseline patient wears a trach collar with 6 L per minute. VQ scan ordered upon admission - pending.  SpO2: 97 % O2 Flow Rate (L/min): 8 L/min FiO2 (%): 35 %  Recent Labs  Lab 10-24-20 1004 Oct 24, 2020 1126 10-24-2020 1127 2020-10-24 1248 2020/10/24 1532 10-24-2020 1618 2020/10/24 1831 10/02/20 0758  WBC  --    --   --  11.0*  --   --  16.1* 10.7*  HGB  --   --   --  11.7*  --   --  11.9* 10.4*  HCT  --   --   --  38.5  --   --  39.6 32.8*  PLT  --   --   --  328  --   --  302 281  CRP  --   --   --   --   --   --   --  6.9*  BNP  --   --   --   --   --  21.5  --  63.9  DDIMER  --   --  1.47*  --   --   --   --   --   PROCALCITON  --   --   --   --   --   --   --  <0.10  AST  --   --  50*  --   --   --   --  18  ALT  --   --  28  --   --   --   --  21  ALKPHOS  --   --  112  --   --   --   --  94  BILITOT  --   --  0.8  --   --   --   --  0.3  ALBUMIN  --   --  3.0*  --   --   --   --  2.6*  INR  --   --  1.0  --   --   --   --   --   LATICACIDVEN  --  3.6*  --   --  2.1*  --   --   --   SARSCOV2NAA NEGATIVE  --   --   --   --   --   --   --      2.  Anoxic brain injury due to PEA arrest in the past (August 2021 - asthma attack induced) .  Trached and pegged at baseline.  Minimal to no response to verbal stimuli only responds to painful stimuli.  Poor functional status with history of seizures.  Continue supportive care with Keppra.  Poor prognosis.  Had a detailed discussion with patient's daughter she is willing to listen to what neurology has to say about her long-term outlook.  Will obtain neurology input although I have told her that long-term prognosis not look good and chances of meaningful recovery are minimal to none.  3.  Chronic diastolic CHF EF 60% on echocardiogram in December 2021.  Currently dehydrated.  Hydrate and monitor.  4.  IV PPI.  5.  History of seizures with anoxic brain injury.  On Keppra.  6.  COPD/ Asthma - stable, supportive Rx.  7. DM type II.  On sliding scale and Lantus.  Feeding via PEG tube  Lab Results  Component Value Date   HGBA1C 7.0 (H) 10/05/2020   CBG (last 3)  Recent Labs    10/10/2020 1947 10/02/20 0805 10/02/20 1251  GLUCAP 169* 91 125*     Nutrition Problem: Nutrition Problem: Increased nutrient needs Etiology: wound  healing Signs/Symptoms: estimated needs Interventions: Tube feeding  Obesity: Estimated body mass index is 41.6 kg/m as calculated from the following:   Height as of 09/08/19: 5' (1.524 m).   Weight as of 09/08/19: 96.6 kg.          Condition - Extremely Guarded  Family Communication  : Daughter [name] [phone] on 10/02/20  Code Status :  Full  Consults  :  PCCM, Neuro  PUD Prophylaxis : PPI   Procedures  :      VQ -       Disposition Plan  :    Status  is: Inpatient  Remains inpatient appropriate because:IV treatments appropriate due to intensity of illness or inability to take PO  Dispo: The patient is from: SNF              Anticipated d/c is to: SNF              Patient currently is not medically stable to d/c.   Difficult to place patient No  DVT Prophylaxis  :    enoxaparin (LOVENOX) injection 40 mg Start: 2020-10-11 2200     Lab Results  Component Value Date   PLT 281 10/02/2020    Diet :  Diet Order             Diet NPO time specified  Diet effective now                    Inpatient Medications  Scheduled Meds:  budesonide  0.5 mg Nebulization BID   chlorhexidine  15 mL Mouth/Throat BID   clonazePAM  0.5 mg Per Tube BID   clopidogrel  75 mg Per Tube Daily   enoxaparin (LOVENOX) injection  40 mg Subcutaneous Q24H   famotidine  20 mg Per Tube BID   feeding supplement (PROSource TF)  45 mL Per Tube BID   insulin aspart  0-15 Units Subcutaneous TID WC   insulin glargine  40 Units Subcutaneous QHS   ipratropium-albuterol  3 mL Nebulization Q4H   levETIRAcetam  2,500 mg Per Tube BID   methylPREDNISolone (SOLU-MEDROL) injection  40 mg Intravenous Daily   montelukast  10 mg Per Tube Daily   pantoprazole (PROTONIX) IV  40 mg Intravenous Q24H   potassium chloride  20 mEq Per Tube Daily   potassium chloride  40 mEq Per Tube Once   senna-docusate  1 tablet Per Tube q AM   Continuous Infusions:  ceFEPime (MAXIPIME) IV 2 g (10/02/20 0511)    feeding supplement (JEVITY 1.5 CAL/FIBER)     lactated ringers     magnesium sulfate bolus IVPB     metronidazole 500 mg (10/02/20 4098)   vancomycin     PRN Meds:.ipratropium-albuterol  Antibiotics  :    Anti-infectives (From admission, onward)    Start     Dose/Rate Route Frequency Ordered Stop   10/02/20 1400  vancomycin (VANCOREADY) IVPB 1250 mg/250 mL        1,250 mg 166.7 mL/hr over 90 Minutes Intravenous Every 24 hours Oct 11, 2020 1313     10/11/20 2200  ceFEPIme (MAXIPIME) 2 g in sodium chloride 0.9 % 100 mL IVPB        2 g 200 mL/hr over 30 Minutes Intravenous Every 8 hours 2020/10/11 1313     10/11/20 2200  metroNIDAZOLE (FLAGYL) IVPB 500 mg        500 mg 100 mL/hr over 60 Minutes Intravenous Every 8 hours 10/11/20 1616     Oct 11, 2020 1330  vancomycin (VANCOREADY) IVPB 2000 mg/400 mL        2,000 mg 200 mL/hr over 120 Minutes Intravenous  Once Oct 11, 2020 1257 10/11/20 1550   10/11/20 1300  ceFEPIme (MAXIPIME) 2 g in sodium chloride 0.9 % 100 mL IVPB        2 g 200 mL/hr over 30 Minutes Intravenous  Once Oct 11, 2020 1257 10-11-2020 1330   October 11, 2020 1300  metroNIDAZOLE (FLAGYL) IVPB 500 mg        500 mg 100 mL/hr over 60 Minutes Intravenous  Once 10-11-2020 1257 October 11, 2020 1443  Time Spent in minutes  30   Susa Raring M.D on 10/02/2020 at 2:39 PM  To page go to [email or web address]   [facility] -  Office  [phone]     See all Orders from today for further details    Objective:   Vitals:   10/02/20 0816 10/02/20 1001 10/02/20 1255 10/02/20 1402  BP:  120/85 131/70 (!) 105/47  Pulse: 83 88 90 99  Resp: 20 19  (!) 29  Temp:  98 F (36.7 C) 98.3 F (36.8 C) 98.2 F (36.8 C)  TempSrc:  Axillary Axillary Axillary  SpO2: 98% 98% 95% 97%    Wt Readings from Last 3 Encounters:  09/08/19 96.6 kg  08/30/19 99.7 kg  01/30/19 90.8 kg     Intake/Output Summary (Last 24 hours) at 10/02/2020 1439 Last data filed at 10/02/2020 1403 Gross per 24 hour  Intake  3467.82 ml  Output 1650 ml  Net 1817.82 ml     Physical Exam  Somnolent , responsive to painful stimuli only, Trach and PEG in place Edie.AT,PERRAL Supple Neck,No JVD, No cervical lymphadenopathy appriciated.  Symmetrical Chest wall movement, Good air movement bilaterally, CTAB RRR,No Gallops,Rubs or new Murmurs, No Parasternal Heave +ve B.Sounds, Abd Soft, No tenderness, No organomegaly appriciated, No rebound - guarding or rigidity. No Cyanosis, Clubbing or edema, No new Rash or bruise        RN pressure injury documentation:  Pressure Injury 10/02/20 Buttocks Bilateral Stage 2 -  Partial thickness loss of dermis presenting as a shallow open injury with a red, pink wound bed without slough. (Active)  10/02/20 0300  Location: Buttocks  Location Orientation: Bilateral  Staging: Stage 2 -  Partial thickness loss of dermis presenting as a shallow open injury with a red, pink wound bed without slough.  Wound Description (Comments):   Present on Admission: Yes     Data Review:    CBC Recent Labs  Lab 10-20-2020 1248 10/20/2020 1831 10/02/20 0758  WBC 11.0* 16.1* 10.7*  HGB 11.7* 11.9* 10.4*  HCT 38.5 39.6 32.8*  PLT 328 302 281  MCV 87.1 87.4 85.0  MCH 26.5 26.3 26.9  MCHC 30.4 30.1 31.7  RDW 15.1 15.0 15.0  LYMPHSABS 1.7  --   --   MONOABS 1.0  --   --   EOSABS 0.2  --   --   BASOSABS 0.1  --   --     Recent Labs  Lab 10/20/2020 1126 10/20/2020 1127 2020-10-20 1532 October 20, 2020 1618 Oct 20, 2020 1831 10/02/20 0758  NA  --  137  --   --   --  139  K  --  5.2*  --   --   --  3.5  CL  --  100  --   --   --  104  CO2  --  27  --   --   --  28  GLUCOSE  --  202*  --   --   --  91  BUN  --  16  --   --   --  9  CREATININE  --  0.57  --   --  0.43* 0.45  CALCIUM  --  10.0  --   --   --  10.1  AST  --  50*  --   --   --  18  ALT  --  28  --   --   --  21  ALKPHOS  --  112  --   --   --  94  BILITOT  --  0.8  --   --   --  0.3  ALBUMIN  --  3.0*  --   --   --  2.6*  MG  --    --   --   --   --  1.7  CRP  --   --   --   --   --  6.9*  DDIMER  --  1.47*  --   --   --   --   PROCALCITON  --   --   --   --   --  <0.10  LATICACIDVEN 3.6*  --  2.1*  --   --   --   INR  --  1.0  --   --   --   --   HGBA1C  --   --   --   --  7.0*  --   BNP  --   --   --  21.5  --  63.9    ------------------------------------------------------------------------------------------------------------------ No results for input(s): CHOL, HDL, LDLCALC, TRIG, CHOLHDL, LDLDIRECT in the last 72 hours.  Lab Results  Component Value Date   HGBA1C 7.0 (H) May 26, 2020   ------------------------------------------------------------------------------------------------------------------ No results for input(s): TSH, T4TOTAL, T3FREE, THYROIDAB in the last 72 hours.  Invalid input(s): FREET3  Cardiac Enzymes No results for input(s): CKMB, TROPONINI, MYOGLOBIN in the last 168 hours.  Invalid input(s): CK ------------------------------------------------------------------------------------------------------------------    Component Value Date/Time   BNP 63.9 10/02/2020 0758    Micro Results Recent Results (from the past 240 hour(s))  Resp Panel by RT-PCR (Flu A&B, Covid) Nasopharyngeal Swab     Status: None   Collection Time: 07/27/20 10:04 AM   Specimen: Nasopharyngeal Swab; Nasopharyngeal(NP) swabs in vial transport medium  Result Value Ref Range Status   SARS Coronavirus 2 by RT PCR NEGATIVE NEGATIVE Final    Comment: (NOTE) SARS-CoV-2 target nucleic acids are NOT DETECTED.  The SARS-CoV-2 RNA is generally detectable in upper respiratory specimens during the acute phase of infection. The lowest concentration of SARS-CoV-2 viral copies this assay can detect is 138 copies/mL. A negative result does not preclude SARS-Cov-2 infection and should not be used as the sole basis for treatment or other patient management decisions. A negative result may occur with  improper specimen  collection/handling, submission of specimen other than nasopharyngeal swab, presence of viral mutation(s) within the areas targeted by this assay, and inadequate number of viral copies(<138 copies/mL). A negative result must be combined with clinical observations, patient history, and epidemiological information. The expected result is Negative.  Fact Sheet for Patients:  BloggerCourse.comhttps://www.fda.gov/media/152166/download  Fact Sheet for Healthcare Providers:  SeriousBroker.ithttps://www.fda.gov/media/152162/download  This test is no t yet approved or cleared by the [place] FDA and  has been authorized for detection and/or diagnosis of SARS-CoV-2 by FDA under an Emergency Use Authorization (EUA). This EUA will remain  in effect (meaning this test can be used) for the duration of the COVID-19 declaration under Section 564(b)(1) of the Act, 21 U.S.C.section 360bbb-3(b)(1), unless the authorization is terminated  or revoked sooner.       Influenza A by PCR NEGATIVE NEGATIVE Final   Influenza B by PCR NEGATIVE NEGATIVE Final    Comment: (NOTE) The Xpert Xpress SARS-CoV-2/FLU/RSV plus assay is intended as an aid in the diagnosis of influenza from Nasopharyngeal swab specimens and should not be used as a sole basis for treatment. Nasal washings and aspirates are unacceptable for Xpert Xpress SARS-CoV-2/FLU/RSV testing.  Fact Sheet for Patients: BloggerCourse.com  Fact Sheet for Healthcare Providers: SeriousBroker.it  This test is not yet approved or cleared by the [place] FDA and has been authorized for detection and/or diagnosis of SARS-CoV-2 by FDA under an Emergency Use Authorization (EUA). This EUA will remain in effect (meaning this test can be used) for the duration of the COVID-19 declaration under Section 564(b)(1) of the Act, 21 U.S.C. section 360bbb-3(b)(1), unless the authorization is terminated or revoked.  Performed at [facility], [address] [place], [place] [address]   Blood Culture (routine x 2)     Status: None (Preliminary result)   Collection Time: 10/14/2020 10:04 AM   Specimen: BLOOD  Result Value Ref Range Status   Specimen Description BLOOD RIGHT ANTECUBITAL  Final   Special Requests   Final    BOTTLES DRAWN AEROBIC AND ANAEROBIC Blood Culture results may not be optimal due to an inadequate volume of blood received in culture bottles   Culture  Setup Time   Final    GRAM POSITIVE COCCI IN CLUSTERS IN BOTH AEROBIC AND ANAEROBIC BOTTLES CRITICAL RESULT CALLED TO, READ BACK BY AND VERIFIED WITH: Loura Back J.FRIENS ON [id number] AT 0815 BY E.PARRISH Performed at [facility], [address] [place], [place] [address]    Culture GRAM POSITIVE COCCI  Final   Report Status PENDING  Incomplete  Urine culture     Status: Abnormal   Collection Time: 10/02/2020 10:04 AM   Specimen: In/Out Cath Urine  Result Value Ref Range Status   Specimen Description IN/OUT CATH URINE  Final   Special Requests   Final    NONE Performed at [facility], [address], [place], [place] [address]    Culture MULTIPLE SPECIES PRESENT, SUGGEST RECOLLECTION (A)  Final   Report Status 10/02/2020 FINAL  Final  Blood Culture ID Panel (Reflexed)     Status: Abnormal   Collection Time: 10/18/2020 10:04 AM  Result Value Ref Range Status   Enterococcus faecalis NOT DETECTED NOT DETECTED Final   Enterococcus Faecium NOT DETECTED NOT DETECTED Final   Listeria monocytogenes NOT DETECTED NOT DETECTED Final   Staphylococcus species DETECTED (A) NOT DETECTED Final    Comment: CRITICAL RESULT CALLED TO, READ BACK BY AND VERIFIED WITH: PHARM D J.FRIENS ON [id number] AT 0815 BY E.PARRISH    Staphylococcus aureus (BCID) NOT DETECTED NOT DETECTED Final   Staphylococcus epidermidis NOT DETECTED NOT DETECTED Final   Staphylococcus lugdunensis NOT DETECTED NOT DETECTED Final   Streptococcus species NOT DETECTED NOT DETECTED  Final   Streptococcus agalactiae NOT DETECTED NOT DETECTED Final   Streptococcus pneumoniae NOT DETECTED NOT DETECTED Final   Streptococcus pyogenes NOT DETECTED NOT DETECTED Final   A.calcoaceticus-baumannii NOT DETECTED NOT DETECTED Final   Bacteroides fragilis NOT DETECTED NOT DETECTED Final   Enterobacterales NOT DETECTED NOT DETECTED Final   Enterobacter cloacae complex NOT DETECTED NOT DETECTED Final   Escherichia coli NOT DETECTED NOT DETECTED Final   Klebsiella aerogenes NOT DETECTED NOT DETECTED Final   Klebsiella oxytoca NOT DETECTED NOT DETECTED Final   Klebsiella pneumoniae NOT DETECTED NOT DETECTED Final   Proteus species NOT DETECTED NOT DETECTED Final   Salmonella species NOT DETECTED NOT DETECTED Final   Serratia marcescens NOT DETECTED NOT DETECTED Final   Haemophilus influenzae NOT DETECTED NOT DETECTED Final   Neisseria meningitidis NOT DETECTED NOT DETECTED Final   Pseudomonas aeruginosa NOT DETECTED NOT DETECTED Final   Stenotrophomonas maltophilia  NOT DETECTED NOT DETECTED Final   Candida albicans NOT DETECTED NOT DETECTED Final   Candida auris NOT DETECTED NOT DETECTED Final   Candida glabrata NOT DETECTED NOT DETECTED Final   Candida krusei NOT DETECTED NOT DETECTED Final   Candida parapsilosis NOT DETECTED NOT DETECTED Final   Candida tropicalis NOT DETECTED NOT DETECTED Final   Cryptococcus neoformans/gattii NOT DETECTED NOT DETECTED Final    Comment: Performed at [facility], [address] [place], [place] [address]  Blood Culture (routine x 2)     Status: None (Preliminary result)   Collection Time: 10/03/20 10:09 AM   Specimen: BLOOD  Result Value Ref Range Status   Specimen Description BLOOD LEFT ANTECUBITAL  Final   Special Requests   Final    BOTTLES DRAWN AEROBIC AND ANAEROBIC Blood Culture adequate volume   Culture  Setup Time   Final    GRAM POSITIVE COCCI IN CLUSTERS IN BOTH AEROBIC AND ANAEROBIC BOTTLES CRITICAL VALUE NOTED.  VALUE IS  CONSISTENT WITH PREVIOUSLY REPORTED AND CALLED VALUE. Performed at [facility], [address] [place], [place] [address]    Culture GRAM POSITIVE COCCI  Final   Report Status PENDING  Incomplete  MRSA Next Gen by PCR, Nasal     Status: Abnormal   Collection Time: 10/02/20  7:31 AM  Result Value Ref Range Status   MRSA by PCR Next Gen DETECTED (A) NOT DETECTED Final    Comment: RESULT CALLED TO, READ BACK BY AND VERIFIED WITH: RN A.PURNAGE ON [id number] AT 1113 BY E.PARRISH (NOTE) The GeneXpert MRSA Assay (FDA approved for NASAL specimens only), is one component of a comprehensive MRSA colonization surveillance program. It is not intended to diagnose MRSA infection nor to guide or monitor treatment for MRSA infections. Test performance is not FDA approved in patients less than 15 years old. Performed at [facility], [address] [place], [place] [address]     Radiology Reports DG Chest [facility] 1 View  Result Date: 10/02/2020 CLINICAL DATA:  Shortness of breath EXAM: PORTABLE CHEST 1 VIEW COMPARISON:  2020/10/03 FINDINGS: Evaluation is limited secondary to patient rotation. The cardiomediastinal silhouette is unchanged in contour.Tracheostomy. No pleural effusion. No pneumothorax. LEFT retrocardiac opacity, likely atelectasis. Visualized abdomen is unremarkable. Multilevel degenerative changes of the thoracic spine. IMPRESSION: No acute cardiopulmonary abnormality. Electronically Signed   By: Meda Klinefelter MD   On: 10/02/2020 07:50   DG Chest Port 1 View  Result Date: Oct 03, 2020 CLINICAL DATA:  Possible sepsis.  Vomiting.  Hypoxia. EXAM: PORTABLE CHEST 1 VIEW COMPARISON:  01/17/2020 FINDINGS: Tracheostomy tube in adequate position. Lungs are adequately inflated without focal airspace consolidation or effusion. Cardiomediastinal silhouette and remainder of the exam is unchanged. IMPRESSION: No acute cardiopulmonary disease. Electronically Signed   By: Elberta Fortis  M.D.   On: 10-03-20 10:45   DG Abd Portable 1V  Result Date: 10/02/2020 CLINICAL DATA:  Nausea. EXAM: PORTABLE ABDOMEN - 1 VIEW COMPARISON:  12/25/2017 FINDINGS: Normal bowel gas pattern. No abnormal stool retention. Percutaneous gastrostomy tube in place. No concerning mass effect or calcification. Left hip arthroplasty. Lumbar spine degeneration and scoliosis IMPRESSION: Normal bowel gas pattern. Electronically Signed   By: Marnee Spring M.D.   On: 10/02/2020 09:16

## 2020-10-02 NOTE — Progress Notes (Signed)
   10/02/20 1402  Assess: MEWS Score  Temp 98.2 F (36.8 C)  BP (!) 105/47  Pulse Rate 99  ECG Heart Rate 99  Resp (!) 29  SpO2 97 %  Assess: MEWS Score  MEWS Temp 0  MEWS Systolic 0  MEWS Pulse 0  MEWS RR 2  MEWS LOC 2  MEWS Score 4  MEWS Score Color Red  Assess: if the MEWS score is Yellow or Red  Were vital signs taken at a resting state? Yes  Focused Assessment No change from prior assessment  Early Detection of Sepsis Score *See Row Information* High  MEWS guidelines implemented *See Row Information* No, previously red, continue vital signs every 4 hours

## 2020-10-02 NOTE — Progress Notes (Addendum)
ANTICOAGULATION CONSULT NOTE - Initial Consult  Pharmacy Consult for lovenox Indication: VTE prophylaxis  Allergies  Allergen Reactions   Contrast Media [Iodinated Diagnostic Agents] Anaphylaxis and Other (See Comments)    ** CARDIAC ARREST **   Peanuts [Peanut Oil] Shortness Of Breath and Swelling   Shellfish Allergy Shortness Of Breath and Swelling   Soap Shortness Of Breath and Swelling    LAUNDRY DETERGENT   Latex Hives   Adenosine    Eggs Or Egg-Derived Products Rash    Patient Measurements:   Heparin Dosing Weight: TBD  Vital Signs: Temp: 98.2 F (36.8 C) (06/13 1402) Temp Source: Axillary (06/13 1402) BP: 105/47 (06/13 1402) Pulse Rate: 99 (06/13 1402)  Labs: Recent Labs    10/18/2020 1127 10/12/2020 1248 10/16/2020 1248 10/05/2020 1831 10/02/20 0758  HGB  --  11.7*   < > 11.9* 10.4*  HCT  --  38.5  --  39.6 32.8*  PLT  --  328  --  302 281  APTT 28  --   --   --   --   LABPROT 13.1  --   --   --   --   INR 1.0  --   --   --   --   CREATININE 0.57  --   --  0.43* 0.45   < > = values in this interval not displayed.    CrCl cannot be calculated (Unknown ideal weight.).   Medical History: Past Medical History:  Diagnosis Date   Arthritis    "right knee; left hip" (04/07/2017)   Asthma    CHF (congestive heart failure) (HCC)    reported treatment for fluid overload in the setting of asthma exacerbation ~ 1998   COPD (chronic obstructive pulmonary disease) (HCC)    patient states she does not have COPD but was treated for it.    Depression    "from chemical imbalance" (04/07/2017)   Dyspnea    Dysrhythmia    occasional skipped beats   GERD (gastroesophageal reflux disease)    On home oxygen therapy    "I sleep with 2L" (04/07/2017)   Pneumonia    "several times" (04/07/2017)   Seizures (HCC) 2003 X 1   unknown cause    Medications:  Scheduled:   budesonide  0.5 mg Nebulization BID   chlorhexidine  15 mL Mouth/Throat BID   clonazePAM  0.5 mg Per  Tube BID   clopidogrel  75 mg Per Tube Daily   enoxaparin (LOVENOX) injection  40 mg Subcutaneous Q24H   famotidine  20 mg Per Tube BID   feeding supplement (PROSource TF)  45 mL Per Tube BID   insulin aspart  0-15 Units Subcutaneous TID WC   insulin glargine  40 Units Subcutaneous QHS   ipratropium-albuterol  3 mL Nebulization Q4H   levETIRAcetam  2,500 mg Per Tube BID   methylPREDNISolone (SOLU-MEDROL) injection  40 mg Intravenous Daily   montelukast  10 mg Per Tube Daily   pantoprazole (PROTONIX) IV  40 mg Intravenous Q24H   potassium chloride  20 mEq Per Tube Daily   potassium chloride  40 mEq Per Tube Once   senna-docusate  1 tablet Per Tube q AM    Assessment: 65 yo female with anoxic brain injury. Pharmacy consulted for enoxaparin for VTE prophylaxis. Patient already on enoxaparin 40mg  SQ q24h, however weight this admission is 88kg (BMI 37). Previous weight on 5/19 was 96 kg for a BMI of 41.   Goal of  Therapy:  Prevention of VTE Monitor platelets by anticoagulation protocol: Yes   Plan:  Continue enoxaparin 0.5mg /kg SQ q24h Monitor platelet count and signs of bleeding.   Rudie Sermons A. Jeanella Craze, PharmD, BCPS, FNKF Clinical Pharmacist [place] Please utilize Amion for appropriate phone number to reach the unit pharmacist [facility])  10/02/2020,2:59 PM

## 2020-10-02 NOTE — Consult Note (Signed)
Palliative Medicine Inpatient Consult Note  Reason for consult:  Goals of Care "anoxic brain injury, Trach - PEG - GOC"  HPI:  Per intake H&P --> Sonia Drake is a 65 y.o. female with medical history significant of asthma/COPD, CHF, cardiac arrest with PEA/CPR with chronic respiratory failure on permanent trach with supplemental oxygen at 6L,  type 2 DM, GERD, seizures and severe hypoxic-ischemic encephalopathy who presented to ER via EMS for respiratory distress from SNF.  Palliative care has been asked to discuss goals of care in the setting of multiple chronic debilities.   Clinical Assessment/Goals of Care:  *Please note that this is a verbal dictation therefore any spelling or grammatical errors are due to the "[place]" system interpretation.  I have reviewed medical records including EPIC notes, labs and imaging, received report from bedside RN, assessed the patient who is on tracheostomy collar, coughing incrementally.    I met with Sonia Drake to further discuss diagnosis prognosis, GOC, EOL wishes, disposition and options.   I introduced Palliative Medicine as specialized medical care for people living with serious illness. It focuses on providing relief from the symptoms and stress of a serious illness. The goal is to improve quality of life for both the patient and the family.  Sonia Drake is from [place] originally though she has also lived in [place]. She is married though separated from her husband. She has a son and two daughters one of whom is adopted. She is a former Education officer, museum turned Writer. She is a woman of faith and practices within Christianity. She is identified as a fun loving woman with notable independence prior to her even in August 2021.  Per [name] her mother endured an asthma attack in August 2021 which led to a cardiac arrest. She does not know how long her resuscitation efforts were though she is aware that her mother has significant deficits  secondary to this. Sonia Drake was discharged from [facility] to [facility] where she remained from September 2021 to April 2022. She then transferred to [facility] in April 2022 where she has remained since.   Prior to August 2021 Sonia Drake was fully independent of all bADL's and iADL's.   We reviewed Sonia Drake's past medical history inclusive of dCHF, COPD, GERD, and Type II DM. Discussed the reasons why she was at high risk for poor outcomes after enduring cardiopulmonary arrest. Reviewed that it is likely she is now as functional as she will be for the duration on her life leaving her dependent on others for all life activities.   Sonia Drake expresses knowledge of this. I asked her what would be considered an acceptable quality of life for her mother and inquired as to what their previous conversations had been. Sonia Drake is very clear that she and her mother did have these conversations and her mother had been veru clear that she would want all measures pursued to sustain life. I verified with Sonia Drake would wish for all measures and to be FULL CODE/ FULL SCOPE of care.   I asked where the limits would be with her care? Sonia Drake shares that if Sonia Drake is experiencing a steady decline and have other organs fail, "like her kidneys or liver" then she would amenable to more robust conversations regarding care de-escalation. Right now though she wants to respect her mothers wishes to continue living at all costs/  I asked Sonia Drake if an OP Palliative provider could follow along with her to provide ongoing support as  we encroach on the future which she was open towards.  Sonia Drake requests a medical update from Dr. Candiss Norse and also that neurology weight in on her anoxic brain injury. I shared that I will pass this along.   Discussed the importance of continued conversation with family and their  medical providers regarding overall plan of care and treatment options, ensuring decisions are within the context of the  patients values and GOCs.  Decision Maker: Sonia Drake (daughter) [phone]  SUMMARY OF RECOMMENDATIONS   FULL CODE/FULL SCOPE   Patients daughter was very clear that care would not be de-escalated unless other organ systems (kidneys/liver) started shutting down as per her mothers wishes  Ongoing incremental support given that goals are clear  Code Status/Advance Care Planning: FULL CODE  Palliative Prophylaxis:  Oral Care, Turn Q2H  Additional Recommendations (Limitations, Scope, Preferences): Continue full scope of care   Psycho-social/Spiritual:  Desire for further Chaplaincy support: No Additional Recommendations: Education on anoxic brain injury   Prognosis: Patient suffered an arrest in August 2021 since has been very severely frail she has an extremely high 12 month mortality risk  Discharge Planning: Discharge back to [place]:   10/02/20 1255 10/02/20 1402  BP: 131/70 (!) 105/47  Pulse: 90 99  Resp:  (!) 29  Temp: 98.3 F (36.8 C) 98.2 F (36.8 C)  SpO2: 95% 97%    Intake/Output Summary (Last 24 hours) at 10/02/2020 1424 Last data filed at 10/02/2020 1403 Gross per 24 hour  Intake 3467.82 ml  Output 1650 ml  Net 1817.82 ml   Gen:  Very ill appearing AA F  HEENT: Dry mucous membranes CV: Regular rate and rhythm  PULM: On tracheostomy collar ABD: G tube in place EXT: Gen edema Neuro: Opens eyes doesn't follow commands  PPS: 10%   This conversation/these recommendations were discussed with patient primary care team, Dr. Candiss Norse  Time In: 1335 Time Out: 1445 Total Time: 70 Greater than 50%  of this time was spent counseling and coordinating care related to the above assessment and plan.  Sonia Drake Team Team Cell Phone: [phone] Please utilize secure chat with additional questions, if there is no response within 30 minutes please call the above phone number  Palliative Medicine Team  providers are available by phone from 7am to 7pm daily and can be reached through the team cell phone.  Should this patient require assistance outside of these hours, please call the patient's attending physician.

## 2020-10-02 NOTE — Consult Note (Signed)
Neurology Consultation  Reason for Consult: Family requesting long-term prognosis from neurology Referring Physician: Dr. Thedore Mins  CC: Family requesting prognostication by neurology team  History is obtained from: Chart review, primary physician, family at bedside  HPI: Sonia Drake is a 65 y.o. female with a medical history significant for PEA arrest s/p ROSC and anoxic brain injury in August 2021, chronic respiratory failure with permanent tracheostomy in place, type 2 diabetes mellitus, congestive heart failure, GERD, seizures, and severe hypoxic-ischemic encephalopathy who presented to the ED on 6/12 from her skilled nursing facility for evaluation of respiratory distress and was found to be septic from possible aspiration pneumonia. Family at bedside requesting to speak with neurology for discussion regarding patient's long-term prognostication and function; neurology consulted for patient evaluation and family discussion.   Modified Rankin Scale: 5-Severe disability-bedridden, incontinent, needs constant attention  ROS: Unable to obtain due to altered mental status.   Past Medical History:  Diagnosis Date   Arthritis    "right knee; left hip" (04/07/2017)   Asthma    CHF (congestive heart failure) (HCC)    reported treatment for fluid overload in the setting of asthma exacerbation ~ 1998   COPD (chronic obstructive pulmonary disease) (HCC)    patient states she does not have COPD but was treated for it.    Depression    "from chemical imbalance" (04/07/2017)   Dyspnea    Dysrhythmia    occasional skipped beats   GERD (gastroesophageal reflux disease)    On home oxygen therapy    "I sleep with 2L" (04/07/2017)   Pneumonia    "several times" (04/07/2017)   Seizures (HCC) 2003 X 1   unknown cause   Past Surgical History:  Procedure Laterality Date   JOINT REPLACEMENT     KNEE ARTHROSCOPY Right 2017   TOTAL HIP ARTHROPLASTY Left 04/07/2017   TOTAL HIP ARTHROPLASTY Left  04/07/2017   Procedure: LEFT TOTAL HIP ARTHROPLASTY ANTERIOR APPROACH;  Surgeon: Gean Birchwood, MD;  Location: [facility] OR;  Service: Orthopedics;  Laterality: Left;   Family History  Problem Relation Age of Onset   Heart disease Mother    Heart disease Father    Diabetes Father    Asthma Father    Diabetes Brother    Diabetes Sister    Asthma Brother    Asthma Sister    Hypertension Sister    Heart failure Brother    Hypertension Brother    Diabetes Brother    Heart failure Brother    Hypertension Brother    Diabetes Brother    Hypertension Brother    Hyperlipidemia Brother    Social History:   reports that she has never smoked. She has never used smokeless tobacco. She reports that she does not drink alcohol and does not use drugs.  Medications  Current Facility-Administered Medications:    budesonide (PULMICORT) nebulizer solution 0.5 mg, 0.5 mg, Nebulization, BID, Orland Mustard, MD, 0.5 mg at 10/02/20 0814   ceFEPIme (MAXIPIME) 2 g in sodium chloride 0.9 % 100 mL IVPB, 2 g, Intravenous, Q8H, Mancheril, Candis Schatz, RPH, Last Rate: 200 mL/hr at 10/02/20 1446, 2 g at 10/02/20 1446   chlorhexidine (PERIDEX) 0.12 % solution 15 mL, 15 mL, Mouth/Throat, BID, Orland Mustard, MD, 15 mL at 10/02/20 0844   clonazePAM (KLONOPIN) tablet 0.5 mg, 0.5 mg, Per Tube, BID, Orland Mustard, MD, 0.5 mg at 10/02/20 0844   clopidogrel (PLAVIX) tablet 75 mg, 75 mg, Per Tube, Daily, Orland Mustard, MD, 75 mg at  10/02/20 0844   enoxaparin (LOVENOX) injection 40 mg, 40 mg, Subcutaneous, Q24H, Orland Mustard, MD, 40 mg at 10/13/2020 2200   famotidine (PEPCID) tablet 20 mg, 20 mg, Per Tube, BID, Orland Mustard, MD, 20 mg at 10/02/20 0844   feeding supplement (JEVITY 1.5 CAL/FIBER) liquid 1,000 mL, 1,000 mL, Per Tube, Continuous, Leroy Sea, MD   feeding supplement (PROSource TF) liquid 45 mL, 45 mL, Per Tube, BID, Thedore Mins, Stanford Scotland, MD   insulin aspart (novoLOG) injection 0-15 Units, 0-15 Units,  Subcutaneous, TID WC, Orland Mustard, MD   insulin glargine (LANTUS) injection 40 Units, 40 Units, Subcutaneous, QHS, Orland Mustard, MD, 40 Units at 10/03/2020 2259   ipratropium-albuterol (DUONEB) 0.5-2.5 (3) MG/3ML nebulizer solution 3 mL, 3 mL, Inhalation, Q6H PRN, Orland Mustard, MD   ipratropium-albuterol (DUONEB) 0.5-2.5 (3) MG/3ML nebulizer solution 3 mL, 3 mL, Nebulization, Q4H, Orland Mustard, MD, 3 mL at 10/02/20 1231   lactated ringers infusion, , Intravenous, Continuous, Leroy Sea, MD, Last Rate: 125 mL/hr at 10/02/20 1440, New Bag at 10/02/20 1440   levETIRAcetam (KEPPRA) 100 MG/ML solution 2,500 mg, 2,500 mg, Per Tube, BID, Orland Mustard, MD, 2,500 mg at 10/02/20 0844   magnesium sulfate IVPB 1 g 100 mL, 1 g, Intravenous, Once, Leroy Sea, MD   methylPREDNISolone sodium succinate (SOLU-MEDROL) 40 mg/mL injection 40 mg, 40 mg, Intravenous, Daily, Charlott Holler, MD, 40 mg at 10/02/20 0844   metroNIDAZOLE (FLAGYL) IVPB 500 mg, 500 mg, Intravenous, Q8H, Orland Mustard, MD, Last Rate: 100 mL/hr at 10/02/20 1438, 500 mg at 10/02/20 1438   montelukast (SINGULAIR) tablet 10 mg, 10 mg, Per Tube, Daily, Orland Mustard, MD, 10 mg at 10/02/20 0844   pantoprazole (PROTONIX) injection 40 mg, 40 mg, Intravenous, Q24H, Singh, Stanford Scotland, MD   potassium chloride (KLOR-CON) packet 20 mEq, 20 mEq, Per Tube, Daily, Orland Mustard, MD, 20 mEq at 10/02/20 0844   potassium chloride (KLOR-CON) packet 40 mEq, 40 mEq, Per Tube, Once, Leroy Sea, MD   senna-docusate (Senokot-S) tablet 1 tablet, 1 tablet, Per Tube, q AM, Orland Mustard, MD, 1 tablet at 10/02/20 0848   vancomycin (VANCOREADY) IVPB 1250 mg/250 mL, 1,250 mg, Intravenous, Q24H, Mancheril, Candis Schatz, RPH  Exam: Current vital signs: BP (!) 105/47 (BP Location: Right Arm)   Pulse 99   Temp 98.2 F (36.8 C) (Axillary)   Resp (!) 29   SpO2 97%  Vital signs in last 24 hours: Temp:  [97.6 F (36.4 C)-100.1 F (37.8 C)]  98.2 F (36.8 C) (06/13 1402) Pulse Rate:  [83-125] 99 (06/13 1402) Resp:  [12-33] 29 (06/13 1402) BP: (93-147)/(39-115) 105/47 (06/13 1402) SpO2:  [93 %-100 %] 97 % (06/13 1402) FiO2 (%):  [35 %] 35 % (06/13 1245)  HEENT: Tracheostomy in place Lungs: Erratic respirations. Mucus/secretions from trach Ext: Warm and well perfused  NEURO:  Mental Status: In an awake, vegetative state with eyes open, but no volitional/purposeful responses to any stimuli including voice, loud clap, visual threat and pinching of extremities. Nonverbal. No attempts to communicate. Erratic blinking and occasional facial spasms.  CN: PERRL 3 mm >> 2 mm. Doll's eye reflex present. Eyes conjugate. Spontaneous roving EOM. No corneal reflexes. Face is symmetric with occasional spasms that are non-rhythmic. Breathing pattern is ataxic.  Motor/Sensory: Increased extensor tone to BUE on passive movement at elbows. BUE are otherwise flaccid with no movement to noxious.   BLE are flaccid with no movement to noxious  Reflexes: Low amplitude brisk reflexes present  in upper and lower extremities Coordination/Gait: Unable to assess  Labs I have reviewed labs in epic and the results pertinent to this consultation are: CBC    Component Value Date/Time   WBC 10.7 (H) 10/02/2020 0758   RBC 3.86 (L) 10/02/2020 0758   HGB 10.4 (L) 10/02/2020 0758   HCT 32.8 (L) 10/02/2020 0758   PLT 281 10/02/2020 0758   MCV 85.0 10/02/2020 0758   MCH 26.9 10/02/2020 0758   MCHC 31.7 10/02/2020 0758   RDW 15.0 10/02/2020 0758   LYMPHSABS 1.7 Mar 02, 2021 1248   MONOABS 1.0 Mar 02, 2021 1248   EOSABS 0.2 Mar 02, 2021 1248   BASOSABS 0.1 Mar 02, 2021 1248   CMP     Component Value Date/Time   NA 139 10/02/2020 0758   K 3.5 10/02/2020 0758   CL 104 10/02/2020 0758   CO2 28 10/02/2020 0758   GLUCOSE 91 10/02/2020 0758   BUN 9 10/02/2020 0758   CREATININE 0.45 10/02/2020 0758   CALCIUM 10.1 10/02/2020 0758   PROT 6.1 (L) 10/02/2020 0758    ALBUMIN 2.6 (L) 10/02/2020 0758   AST 18 10/02/2020 0758   ALT 21 10/02/2020 0758   ALKPHOS 94 10/02/2020 0758   BILITOT 0.3 10/02/2020 0758   GFRNONAA >60 10/02/2020 0758   GFRAA >60 08/30/2019 0453   Lipid Panel  No results found for: CHOL, TRIG, HDL, CHOLHDL, VLDL, LDLCALC, LDLDIRECT Lactic Acid, Venous    Component Value Date/Time   LATICACIDVEN 2.1 (HH) Mar 02, 2021 1532   Results for orders placed or performed during the hospital encounter of 06-12-2020  Resp Panel by RT-PCR (Flu A&B, Covid) Nasopharyngeal Swab     Status: None   Collection Time: 06-12-2020 10:04 AM   Specimen: Nasopharyngeal Swab; Nasopharyngeal(NP) swabs in vial transport medium  Result Value Ref Range Status   SARS Coronavirus 2 by RT PCR NEGATIVE NEGATIVE Final    Comment: (NOTE) SARS-CoV-2 target nucleic acids are NOT DETECTED.  The SARS-CoV-2 RNA is generally detectable in upper respiratory specimens during the acute phase of infection. The lowest concentration of SARS-CoV-2 viral copies this assay can detect is 138 copies/mL. A negative result does not preclude SARS-Cov-2 infection and should not be used as the sole basis for treatment or other patient management decisions. A negative result may occur with  improper specimen collection/handling, submission of specimen other than nasopharyngeal swab, presence of viral mutation(s) within the areas targeted by this assay, and inadequate number of viral copies(<138 copies/mL). A negative result must be combined with clinical observations, patient history, and epidemiological information. The expected result is Negative.  Fact Sheet for Patients:  BloggerCourse.comhttps://www.fda.gov/media/152166/download  Fact Sheet for Healthcare Providers:  SeriousBroker.ithttps://www.fda.gov/media/152162/download  This test is no t yet approved or cleared by the [place] FDA and  has been authorized for detection and/or diagnosis of SARS-CoV-2 by FDA under an Emergency Use Authorization (EUA).  This EUA will remain  in effect (meaning this test can be used) for the duration of the COVID-19 declaration under Section 564(b)(1) of the Act, 21 U.S.C.section 360bbb-3(b)(1), unless the authorization is terminated  or revoked sooner.       Influenza A by PCR NEGATIVE NEGATIVE Final   Influenza B by PCR NEGATIVE NEGATIVE Final    Comment: (NOTE) The Xpert Xpress SARS-CoV-2/FLU/RSV plus assay is intended as an aid in the diagnosis of influenza from Nasopharyngeal swab specimens and should not be used as a sole basis for treatment. Nasal washings and aspirates are unacceptable for Xpert Xpress SARS-CoV-2/FLU/RSV testing.  Fact  Sheet for Patients: BloggerCourse.com  Fact Sheet for Healthcare Providers: SeriousBroker.it  This test is not yet approved or cleared by the [place] FDA and has been authorized for detection and/or diagnosis of SARS-CoV-2 by FDA under an Emergency Use Authorization (EUA). This EUA will remain in effect (meaning this test can be used) for the duration of the COVID-19 declaration under Section 564(b)(1) of the Act, 21 U.S.C. section 360bbb-3(b)(1), unless the authorization is terminated or revoked.  Performed at [facility], [address] [place], [place] [address]   Blood Culture (routine x 2)     Status: None (Preliminary result)   Collection Time: 2020-10-04 10:04 AM   Specimen: BLOOD  Result Value Ref Range Status   Specimen Description BLOOD RIGHT ANTECUBITAL  Final   Special Requests   Final    BOTTLES DRAWN AEROBIC AND ANAEROBIC Blood Culture results may not be optimal due to an inadequate volume of blood received in culture bottles   Culture  Setup Time   Final    GRAM POSITIVE COCCI IN CLUSTERS IN BOTH AEROBIC AND ANAEROBIC BOTTLES CRITICAL RESULT CALLED TO, READ BACK BY AND VERIFIED WITH: Loura Back J.FRIENS ON [id number] AT 0815 BY E.PARRISH Performed at [facility], [address], [place], [place] [address]    Culture GRAM POSITIVE COCCI  Final   Report Status PENDING  Incomplete  Urine culture     Status: Abnormal   Collection Time: Oct 04, 2020 10:04 AM   Specimen: In/Out Cath Urine  Result Value Ref Range Status   Specimen Description IN/OUT CATH URINE  Final   Special Requests   Final    NONE Performed at [facility], [address], [place], [place] [address]    Culture MULTIPLE SPECIES PRESENT, SUGGEST RECOLLECTION (A)  Final   Report Status 10/02/2020 FINAL  Final  Blood Culture ID Panel (Reflexed)     Status: Abnormal   Collection Time: 10/04/2020 10:04 AM  Result Value Ref Range Status   Enterococcus faecalis NOT DETECTED NOT DETECTED Final   Enterococcus Faecium NOT DETECTED NOT DETECTED Final   Listeria monocytogenes NOT DETECTED NOT DETECTED Final   Staphylococcus species DETECTED (A) NOT DETECTED Final    Comment: CRITICAL RESULT CALLED TO, READ BACK BY AND VERIFIED WITH: PHARM D J.FRIENS ON [id number] AT 0815 BY E.PARRISH    Staphylococcus aureus (BCID) NOT DETECTED NOT DETECTED Final   Staphylococcus epidermidis NOT DETECTED NOT DETECTED Final   Staphylococcus lugdunensis NOT DETECTED NOT DETECTED Final   Streptococcus species NOT DETECTED NOT DETECTED Final   Streptococcus agalactiae NOT DETECTED NOT DETECTED Final   Streptococcus pneumoniae NOT DETECTED NOT DETECTED Final   Streptococcus pyogenes NOT DETECTED NOT DETECTED Final   A.calcoaceticus-baumannii NOT DETECTED NOT DETECTED Final   Bacteroides fragilis NOT DETECTED NOT DETECTED Final   Enterobacterales NOT DETECTED NOT DETECTED Final   Enterobacter cloacae complex NOT DETECTED NOT DETECTED Final   Escherichia coli NOT DETECTED NOT DETECTED Final   Klebsiella aerogenes NOT DETECTED NOT DETECTED Final   Klebsiella oxytoca NOT DETECTED NOT DETECTED Final   Klebsiella pneumoniae NOT DETECTED NOT DETECTED Final   Proteus species NOT DETECTED NOT DETECTED Final   Salmonella  species NOT DETECTED NOT DETECTED Final   Serratia marcescens NOT DETECTED NOT DETECTED Final   Haemophilus influenzae NOT DETECTED NOT DETECTED Final   Neisseria meningitidis NOT DETECTED NOT DETECTED Final   Pseudomonas aeruginosa NOT DETECTED NOT DETECTED Final   Stenotrophomonas maltophilia NOT  DETECTED NOT DETECTED Final   Candida albicans NOT DETECTED NOT DETECTED Final   Candida auris NOT DETECTED NOT DETECTED Final   Candida glabrata NOT DETECTED NOT DETECTED Final   Candida krusei NOT DETECTED NOT DETECTED Final   Candida parapsilosis NOT DETECTED NOT DETECTED Final   Candida tropicalis NOT DETECTED NOT DETECTED Final   Cryptococcus neoformans/gattii NOT DETECTED NOT DETECTED Final    Comment: Performed at [facility], [address] [place], [place] [address]  Blood Culture (routine x 2)     Status: None (Preliminary result)   Collection Time: 30-Oct-2020 10:09 AM   Specimen: BLOOD  Result Value Ref Range Status   Specimen Description BLOOD LEFT ANTECUBITAL  Final   Special Requests   Final    BOTTLES DRAWN AEROBIC AND ANAEROBIC Blood Culture adequate volume   Culture  Setup Time   Final    GRAM POSITIVE COCCI IN CLUSTERS IN BOTH AEROBIC AND ANAEROBIC BOTTLES CRITICAL VALUE NOTED.  VALUE IS CONSISTENT WITH PREVIOUSLY REPORTED AND CALLED VALUE. Performed at [facility], [address], [place], [place] [address]    Culture GRAM POSITIVE COCCI  Final   Report Status PENDING  Incomplete  MRSA Next Gen by PCR, Nasal     Status: Abnormal   Collection Time: 10/02/20  7:31 AM  Result Value Ref Range Status   MRSA by PCR Next Gen DETECTED (A) NOT DETECTED Final    Comment: RESULT CALLED TO, READ BACK BY AND VERIFIED WITH: RN A.PURNAGE ON [id number] AT 1113 BY E.PARRISH (NOTE) The GeneXpert MRSA Assay (FDA approved for NASAL specimens only), is one component of a comprehensive MRSA colonization surveillance program. It is not intended to diagnose MRSA infection nor to  guide or monitor treatment for MRSA infections. Test performance is not FDA approved in patients less than 7 years old. Performed at [facility], [address], [place], [place] [address]    Urinalysis    Component Value Date/Time   COLORURINE YELLOW 2020-10-30 1128   APPEARANCEUR HAZY (A) 30-Oct-2020 1128   LABSPEC 1.016 Oct 30, 2020 1128   PHURINE 7.0 10/30/2020 1128   GLUCOSEU NEGATIVE Oct 30, 2020 1128   HGBUR SMALL (A) 10/30/2020 1128   BILIRUBINUR NEGATIVE 10-30-20 1128   KETONESUR NEGATIVE October 30, 2020 1128   PROTEINUR 30 (A) 10-30-2020 1128   UROBILINOGEN 0.2 07/03/2014 1456   NITRITE NEGATIVE 30-Oct-2020 1128   LEUKOCYTESUR MODERATE (A) 10/30/20 1128   Lab Results  Component Value Date   HGBA1C 7.0 (H) 10-30-20   Assessment: 65 y.o. female with PMHx of PEA arrest 2/2 asthma attack, anoxic brain injury 2/2 cardiac arrest in August 2021, chronic hypoxic respiratory failure s/p permanent tracheostomy and PEG, HFpEF, history of seizures 2/2 anoxic brain injury, and COPD/asthma who was admitted for respiratory distress with suspected sepsis 2/2 aspiration pneumonia. Patient's family at bedside has requested to speak with neurology for questions regarding patient's long-term prognostication and function. - Examination reveals findings most consistent with a vegetative state with preservation of all tested brainstem reflexes except for corneal reflexes. Exam findings best localize as diffuse cerebral hypofunction.  - Etiology based on history is diffuse anoxic brain injury and the exam is consistent with this as well.  - Prognosis for a meaningful long term neurological recovery is poor.   Impression: Anoxic brain injury 2/2 PEA arrest in August 2021 Chronic hypoxic respiratory failure on permanent tracheostomy  Seizure disorder 2/2 anoxic brain injury Sepsis due to aspiration pneumonia   Recommendations: -  Continue management of sepsis / respiratory failure as you are   - Prognosis for a meaningful long term neurological recovery is poor.   I have seen and examined the patient. I have formulated the assessment and recommendations. HPI scribed by Lanae Boast, NP. Electronically signed: Dr. Caryl Pina

## 2020-10-02 NOTE — Progress Notes (Signed)
PHARMACY - PHYSICIAN COMMUNICATION CRITICAL VALUE ALERT - BLOOD CULTURE IDENTIFICATION (BCID)  [name] is an 65 y.o. female who presented to [facility] on October 07, 2020 with a chief complaint of respiratory distress  Assessment: two sets of blood cultures growing GPC in clusters with BCID identified as staph species  Name of physician (or Provider) Contacted: Dr. Thedore Mins  Current antibiotics: Vancomycin, cefpeime, metronidazole  Changes to prescribed antibiotics recommended: None - await further information to determine if blood cultures are true or contamination Patient is on recommended antibiotics - No changes needed  Results for orders placed or performed during the hospital encounter of 2020/10/07  Blood Culture ID Panel (Reflexed) (Collected: Oct 07, 2020 10:04 AM)  Result Value Ref Range   Enterococcus faecalis NOT DETECTED NOT DETECTED   Enterococcus Faecium NOT DETECTED NOT DETECTED   Listeria monocytogenes NOT DETECTED NOT DETECTED   Staphylococcus species DETECTED (A) NOT DETECTED   Staphylococcus aureus (BCID) NOT DETECTED NOT DETECTED   Staphylococcus epidermidis NOT DETECTED NOT DETECTED   Staphylococcus lugdunensis NOT DETECTED NOT DETECTED   Streptococcus species NOT DETECTED NOT DETECTED   Streptococcus agalactiae NOT DETECTED NOT DETECTED   Streptococcus pneumoniae NOT DETECTED NOT DETECTED   Streptococcus pyogenes NOT DETECTED NOT DETECTED   A.calcoaceticus-baumannii NOT DETECTED NOT DETECTED   Bacteroides fragilis NOT DETECTED NOT DETECTED   Enterobacterales NOT DETECTED NOT DETECTED   Enterobacter cloacae complex NOT DETECTED NOT DETECTED   Escherichia coli NOT DETECTED NOT DETECTED   Klebsiella aerogenes NOT DETECTED NOT DETECTED   Klebsiella oxytoca NOT DETECTED NOT DETECTED   Klebsiella pneumoniae NOT DETECTED NOT DETECTED   Proteus species NOT DETECTED NOT DETECTED   Salmonella species NOT DETECTED NOT DETECTED   Serratia marcescens NOT DETECTED NOT  DETECTED   Haemophilus influenzae NOT DETECTED NOT DETECTED   Neisseria meningitidis NOT DETECTED NOT DETECTED   Pseudomonas aeruginosa NOT DETECTED NOT DETECTED   Stenotrophomonas maltophilia NOT DETECTED NOT DETECTED   Candida albicans NOT DETECTED NOT DETECTED   Candida auris NOT DETECTED NOT DETECTED   Candida glabrata NOT DETECTED NOT DETECTED   Candida krusei NOT DETECTED NOT DETECTED   Candida parapsilosis NOT DETECTED NOT DETECTED   Candida tropicalis NOT DETECTED NOT DETECTED   Cryptococcus neoformans/gattii NOT DETECTED NOT DETECTED    [name] 10/02/2020  8:31 AM

## 2020-10-02 NOTE — Progress Notes (Signed)
I followed up with Ms. Sonia Drake while receiving her neb. She has less tachypnea and accessory muscle use. WOB now controlled. Trach aspirate collected by respiratory. CXR reviewed> no acute findings. Ok to stay on the floor now with escalated regimen.  Steffanie Dunn, DO 10/02/20 3:48 PM [facility]

## 2020-10-02 NOTE — Evaluation (Signed)
Physical Therapy Evaluation Patient Details Name: JIZEL CHEEKS MRN: [id number] DOB: 08-03-1955 Today's Date: 10/02/2020   History of Present Illness  65yo female admitted 09/20/2020 with respiratory distress. Found to be in sepsis with acute on chronic respiratory failure with hypoxia. PMH CHF, COPD, dysrhythmia, seizures, TKA, THA, MI with PEA s/p CPR, severe ischemic-hypoxic encephalopathy  Clinical Impression   Patient received in bed, lethargic and non-verbal. Did not make any attempt to communicate with or respond to therapist stimulations/cues. Definite need for totalAx2 for all aspects of mobility. Noted to have bilateral mild planterflexion contractures (R>L with slight ankle inversion contracture noted), skin does appear intact. Mild limitation in B knee ROM. Suspect that she may be at her baseline based on information from chart and hx of severe hypoxic encephalopathy- family not present to confirm cognition and care levels. Left in bed with all needs met positioned to comfort. Currently recommend return to SNF at a custodial care level. Signing off, thank you for the opportunity to participate in her care!      Follow Up Recommendations SNF;Supervision/Assistance - 24 hour    Equipment Recommendations  Hospital bed;Wheelchair (measurements PT);Wheelchair cushion (measurements PT);Other (comment) (hoyer lift and pads)    Recommendations for Other Services       Precautions / Restrictions Precautions Precautions: Fall;Other (comment) Precaution Comments: trach Restrictions Weight Bearing Restrictions: No      Mobility  Bed Mobility Overal bed mobility: Needs Assistance Bed Mobility: Rolling Rolling: Total assist;+2 for physical assistance         General bed mobility comments: possibly at her baseline    Transfers                 General transfer comment: unable- will require hoyer  Ambulation/Gait             General Gait Details: unable  Stairs             Wheelchair Mobility    Modified Rankin (Stroke Patients Only)       Balance                                             Pertinent Vitals/Pain Pain Assessment: Faces Faces Pain Scale: Hurts a little bit Pain Location: general discomfort Pain Descriptors / Indicators: Discomfort Pain Intervention(s): Limited activity within patient's tolerance;Monitored during session    Home Living Family/patient expects to be discharged to:: Skilled nursing facility                 Additional Comments: from [facility] per chart    Prior Function           Comments: unable to provide any history and family not present- but per chart bedbound     Hand Dominance        Extremity/Trunk Assessment   Upper Extremity Assessment Upper Extremity Assessment: Generalized weakness    Lower Extremity Assessment Lower Extremity Assessment: Generalized weakness    Cervical / Trunk Assessment Cervical / Trunk Assessment: Kyphotic  Communication   Communication: No difficulties  Cognition Arousal/Alertness: Lethargic Behavior During Therapy: Flat affect Overall Cognitive Status: No family/caregiver present to determine baseline cognitive functioning                                 General Comments: lethargic and  made no attempts at communicating with or acknowledging therapist. Family not present to confirm/clarify baseline cognition but per chart seems to potentially be at baseline. No cue following.      General Comments General comments (skin integrity, edema, etc.): did not get to EOB today- suspect poor to zero sitting balance. Likely hoyer lift transfers at baseline.    Exercises     Assessment/Plan    PT Assessment All further PT needs can be met in the next venue of care  PT Problem List Decreased strength;Decreased cognition;Decreased mobility       PT Treatment Interventions Therapeutic activities    PT Goals  (Current goals can be found in the Care Plan section)  Acute Rehab PT Goals PT Goal Formulation: Patient unable to participate in goal setting Potential to Achieve Goals: Fair    Frequency Other (Comment) (Eval only)   Barriers to discharge        Co-evaluation               AM-PAC PT "6 Clicks" Mobility  Outcome Measure Help needed turning from your back to your side while in a flat bed without using bedrails?: Total Help needed moving from lying on your back to sitting on the side of a flat bed without using bedrails?: Total Help needed moving to and from a bed to a chair (including a wheelchair)?: Total Help needed standing up from a chair using your arms (e.g., wheelchair or bedside chair)?: Total Help needed to walk in hospital room?: Total Help needed climbing 3-5 steps with a railing? : Total 6 Click Score: 6    End of Session   Activity Tolerance: Patient tolerated treatment well Patient left: in bed;with call bell/phone within reach;with bed alarm set Nurse Communication: Mobility status;Other (comment) (need for deep suctioning) PT Visit Diagnosis: Other abnormalities of gait and mobility (R26.89)    Time: 1000-1019 PT Time Calculation (min) (ACUTE ONLY): 19 min   Charges:   PT Evaluation $PT Eval Moderate Complexity: 1 Mod         Windell Norfolk, DPT, PN1   Supplemental Physical Therapist [place]    Pager [phone] Acute Rehab Office [phone]

## 2020-10-02 NOTE — Progress Notes (Deleted)
PT Cancellation Note  Patient Details Name: Sonia Drake MRN: [id number] DOB: Jan 04, 1956   Cancelled Treatment:    Reason Eval/Treat Not Completed: Medical issues which prohibited therapy Still awaiting results of VQ scan to r/o PE. Will continue to follow.    Madelaine Etienne, DPT, PN1   Supplemental Physical Therapist [facility]    Pager [phone] Acute Rehab Office [phone]

## 2020-10-02 NOTE — Consult Note (Signed)
WOC Nurse Consult Note: Patient receiving care in [facility] [phone] Reason for Consult: sacral wound Wound type: MASD/IAD Pressure Injury POA: NA Measurement: Deferred Wound bed: Pink Drainage (amount, consistency, odor) scant amount of bloody drainage Periwound: intact Dressing procedure/placement/frequency: Clean the sacrum with soap and water, rinse, pat dry. Place a piece of Xeroform gauze on the open wounds and secure with foam dressing. Change daily.   ICD-10 CM Codes for Irritant Dermatitis L24A0 - Due to friction or contact with body fluids; unspecified  Monitor the wound area(s) for worsening of condition such as: Signs/symptoms of infection, increase in size, development of or worsening of odor, development of pain, or increased pain at the affected locations.   Notify the medical team if any of these develop.  Thank you for the consult. WOC nurse will not follow at this time.   Please re-consult the WOC team if needed.  Renaldo Reel Katrinka Blazing, MSN, RN, CMSRN, Angus Seller, [facility] Wound Treatment Associate Pager [phone]

## 2020-10-02 NOTE — Consult Note (Signed)
NAME:  Sonia Drake, MRN:  [id number], DOB:  [birth year], LOS: 1 ADMISSION DATE:  05-Oct-2020, CONSULTATION DATE:  10/02/2020 REFERRING MD:  Leroy Sea, MD, CHIEF COMPLAINT:  respiratory failure, vent management   History of Present Illness:  Sonia Drake is a 65 year old woman with history of cardiac arrest with anoxic brain injury status post tracheostomy in August 2021 who presents for new patient evaluation for [facility] skilled nursing facility for worsening respiratory failure.  She is on trach collar 6 L with humidification daily.  She was discharged to [facility] initially and then transferred to skilled nursing facility after ventilator liberation.  At baseline she has bedbound, nonverbal.  She had a coughing spell earlier today at [facility] and possible aspiration.  There were copious secretions suctioned from her tracheostomy.  She was transferred to [facility], ED for further evaluation.  Here she is found to be tachycardic, tachypneic, and having worsening hypoxemia.  Chest x-ray is obtained and shows no acute cardiopulmonary process.  She has been evaluated for sepsis and being ruled out for infection.  Pulmonary's been consulted to assist with respiratory failure and trach management in the setting of sepsis.  Pertinent  Medical History  COPD Seizure disorder Type 2 DM  Significant Hospital Events: Including procedures, antibiotic start and stop dates in addition to other pertinent events   6/12 admitted from skilled nursing facility after aspiration event and copious secretions per tracheostomy 6/13 appears comfortable currently on 35% ATC. No distress. WBC ct trending down. No events overnight growing GPC clusters in two of two cultures  Interim History / Subjective:   No events overnight  Objective   Blood pressure 120/85, pulse 88, temperature 98 F (36.7 C), temperature source Axillary, resp. rate 19, SpO2 98 %.    FiO2 (%):  [35 %] 35 %   Intake/Output Summary (Last  24 hours) at 10/02/2020 1135 Last data filed at 10/02/2020 0330 Gross per 24 hour  Intake 4567.82 ml  Output 950 ml  Net 3617.82 ml   There were no vitals filed for this visit.  Examination: General: This is a chronically ill-appearing 65 year old female she is lying in bed, she appears in no acute distress HEENT normocephalic does have facial droop, chronic tracheostomy in place cuffless deflated she has purulent appearing tracheal secretions Pulmonary scattered rhonchi but no accessory use currently 35% aerosol trach collar  Card RRR  Abd soft not tender  Ext warm and dry  Neuro lethargic unresponsive   Labs/imaging that I havepersonally reviewed  (right click and "Reselect all SmartList Selections" daily)  See below  Resolved Hospital Problem list     Assessment & Plan:   Sonia Drake is a 65 y.o. woman with history of chronic respiratory failure s/p tracheostomy after PEA arrest. She is here from SNF for worsening shortness of breath and possible aspiration event. PCCM consulted for respiratory failure/vent management.  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 Growing GPC from two different BC sites. Respiratory status not labored. Does have thick tracheal secretions.. Film rotated. Vol lose left base.  tracheobronchitis vs asp PNA  Plan Cont routine trach care Consult trach team Needs to be changed to cuffless trach prior to dc Sputum culture  Cont broad spec abx  We will s/o   Theron Arista  Duncan Dull ACNP-BC [facility] Pulmonary/Critical Care Pager # [phone] OR # [phone] if no answer

## 2020-10-03 ENCOUNTER — Inpatient Hospital Stay (HOSPITAL_COMMUNITY): Payer: Medicare Other

## 2020-10-03 DIAGNOSIS — R569 Unspecified convulsions: Secondary | ICD-10-CM | POA: Diagnosis not present

## 2020-10-03 DIAGNOSIS — R7881 Bacteremia: Secondary | ICD-10-CM

## 2020-10-03 DIAGNOSIS — J9621 Acute and chronic respiratory failure with hypoxia: Secondary | ICD-10-CM | POA: Diagnosis not present

## 2020-10-03 LAB — CBC WITH DIFFERENTIAL/PLATELET
Abs Immature Granulocytes: 0.07 10*3/uL (ref 0.00–0.07)
Basophils Absolute: 0 10*3/uL (ref 0.0–0.1)
Basophils Relative: 0 %
Eosinophils Absolute: 0 10*3/uL (ref 0.0–0.5)
Eosinophils Relative: 0 %
HCT: 30.9 % — ABNORMAL LOW (ref 36.0–46.0)
Hemoglobin: 9.2 g/dL — ABNORMAL LOW (ref 12.0–15.0)
Immature Granulocytes: 1 %
Lymphocytes Relative: 9 %
Lymphs Abs: 0.9 10*3/uL (ref 0.7–4.0)
MCH: 26.2 pg (ref 26.0–34.0)
MCHC: 29.8 g/dL — ABNORMAL LOW (ref 30.0–36.0)
MCV: 88 fL (ref 80.0–100.0)
Monocytes Absolute: 0.4 10*3/uL (ref 0.1–1.0)
Monocytes Relative: 5 %
Neutro Abs: 8.1 10*3/uL — ABNORMAL HIGH (ref 1.7–7.7)
Neutrophils Relative %: 85 %
Platelets: 279 10*3/uL (ref 150–400)
RBC: 3.51 MIL/uL — ABNORMAL LOW (ref 3.87–5.11)
RDW: 15.2 % (ref 11.5–15.5)
WBC: 9.5 10*3/uL (ref 4.0–10.5)
nRBC: 0 % (ref 0.0–0.2)

## 2020-10-03 LAB — COMPREHENSIVE METABOLIC PANEL
ALT: 25 U/L (ref 0–44)
AST: 20 U/L (ref 15–41)
Albumin: 2.6 g/dL — ABNORMAL LOW (ref 3.5–5.0)
Alkaline Phosphatase: 88 U/L (ref 38–126)
Anion gap: 7 (ref 5–15)
BUN: 14 mg/dL (ref 8–23)
CO2: 23 mmol/L (ref 22–32)
Calcium: 10.1 mg/dL (ref 8.9–10.3)
Chloride: 108 mmol/L (ref 98–111)
Creatinine, Ser: 0.43 mg/dL — ABNORMAL LOW (ref 0.44–1.00)
GFR, Estimated: >60 mL/min (ref >60)
Glucose, Bld: 241 mg/dL — ABNORMAL HIGH (ref 70–99)
Potassium: 4.2 mmol/L (ref 3.5–5.1)
Sodium: 138 mmol/L (ref 135–145)
Total Bilirubin: 0.4 mg/dL (ref 0.3–1.2)
Total Protein: 6 g/dL — ABNORMAL LOW (ref 6.5–8.1)

## 2020-10-03 LAB — ECHOCARDIOGRAM COMPLETE
AR max vel: 1.42 cm2
AV Area VTI: 1.64 cm2
AV Area mean vel: 1.3 cm2
AV Mean grad: 9 mmHg
AV Peak grad: 15.5 mmHg
Ao pk vel: 1.97 m/s
Area-P 1/2: 4.49 cm2
Height: 62 in
S' Lateral: 2.4 cm
Weight: 3234.59 oz

## 2020-10-03 LAB — C-REACTIVE PROTEIN: CRP: 2.8 mg/dL — ABNORMAL HIGH (ref <1.0)

## 2020-10-03 LAB — GLUCOSE, CAPILLARY
Glucose-Capillary: 187 mg/dL — ABNORMAL HIGH (ref 70–99)
Glucose-Capillary: 189 mg/dL — ABNORMAL HIGH (ref 70–99)
Glucose-Capillary: 189 mg/dL — ABNORMAL HIGH (ref 70–99)
Glucose-Capillary: 189 mg/dL — ABNORMAL HIGH (ref 70–99)
Glucose-Capillary: 215 mg/dL — ABNORMAL HIGH (ref 70–99)
Glucose-Capillary: 219 mg/dL — ABNORMAL HIGH (ref 70–99)

## 2020-10-03 LAB — MAGNESIUM: Magnesium: 2 mg/dL (ref 1.7–2.4)

## 2020-10-03 LAB — PROCALCITONIN: Procalcitonin: <0.1 ng/mL

## 2020-10-03 LAB — BRAIN NATRIURETIC PEPTIDE: B Natriuretic Peptide: 104.1 pg/mL — ABNORMAL HIGH (ref 0.0–100.0)

## 2020-10-03 NOTE — Evaluation (Signed)
Occupational Therapy Evaluation Patient Details Name: Sonia Drake MRN: [id number] DOB: 1955/06/01 Today's Date: 10/03/2020    History of Present Illness 64yo female admitted Oct 05, 2020 with respiratory distress. Found to be in sepsis with acute on chronic respiratory failure with hypoxia. PMH CHF, COPD, dysrhythmia, seizures, TKA, THA, MI with PEA s/p CPR, severe ischemic-hypoxic encephalopathy   Clinical Impression   Pt in bed upon arrival, no verbal. Pt did not respond to verbal, physical cues. Pt requires total A for all selfcare and mobility. Pt is a LTC resident of a SNF and most likely a baseline requiring dependent care. Uncertain if pt has orthotic devices for UEs at this time (pt may benefit from resting hand splints) Family not present to confirm cognition and care levels. Recommend pt return to SNF for total care, no further acute OT services are indicated at this time, OT will sign off   Follow Up Recommendations  SNF;Supervision/Assistance - 24 hour    Equipment Recommendations  None recommended by OT    Recommendations for Other Services       Precautions / Restrictions Precautions Precautions: Fall;Other (comment) Precaution Comments: trach Restrictions Weight Bearing Restrictions: No      Mobility Bed Mobility Overal bed mobility: Needs Assistance Bed Mobility: Rolling Rolling: Total assist         General bed mobility comments: most likely baseline    Transfers Overall transfer level: Independent               General transfer comment: unable- will require mechanical lift    Balance                                           ADL either performed or assessed with clinical judgement   ADL                                         General ADL Comments: total A, baseline     Vision Baseline Vision/History:  (unable to properly assess at this time, no tracking observed)       Perception     Praxis       Pertinent Vitals/Pain Pain Assessment: Faces Faces Pain Scale: Hurts a little bit Pain Location: general discomfort Pain Descriptors / Indicators: Discomfort Pain Intervention(s): Monitored during session;Repositioned     Hand Dominance     Extremity/Trunk Assessment Upper Extremity Assessment Upper Extremity Assessment: Generalized weakness;LUE deficits/detail;RUE deficits/detail RUE Deficits / Details: increased tone, able to extend joints with PROM LUE Deficits / Details: increased tone, able to extend joints with PROM           Communication Communication Communication: No difficulties   Cognition Arousal/Alertness: Lethargic Behavior During Therapy: Flat affect Overall Cognitive Status: No family/caregiver present to determine baseline cognitive functioning                                 General Comments: lethargic and made no attempts at communicating with or acknowledging therapist. Family not present to confirm/clarify baseline cognition but per chart seems to potentially be at baseline. No cue following.   General Comments       Exercises Other Exercises Other Exercises: PROM to B UE in multiple planes Other Exercises:  B UEs positioned with pilows and digits extended   Shoulder Instructions      Home Living Family/patient expects to be discharged to:: Skilled nursing facility                                 Additional Comments: from [facility] per chart      Prior Functioning/Environment          Comments: unable to provide any history and family not present- but per chart bedbound        OT Problem List: Decreased strength;Decreased cognition;Decreased coordination;Impaired UE functional use      OT Treatment/Interventions:      OT Goals(Current goals can be found in the care plan section)    OT Frequency:     Barriers to D/C:            Co-evaluation              AM-PAC OT "6 Clicks" Daily  Activity     Outcome Measure Help from another person eating meals?: Total Help from another person taking care of personal grooming?: Total Help from another person toileting, which includes using toliet, bedpan, or urinal?: Total Help from another person bathing (including washing, rinsing, drying)?: Total   Help from another person to put on and taking off regular lower body clothing?: Total 6 Click Score: 5   End of Session    Activity Tolerance: Patient tolerated treatment well Patient left: in bed;with call bell/phone within reach  OT Visit Diagnosis: Cognitive communication deficit (R41.841);Other symptoms and signs involving the nervous system (R29.898);Muscle weakness (generalized) (M62.81)                Time: 0737-1062 OT Time Calculation (min): 15 min Charges:  OT General Charges $OT Visit: 1 Visit OT Evaluation $OT Eval Moderate Complexity: 1 Mod    Galen Manila 10/03/2020, 12:29 PM

## 2020-10-03 NOTE — Progress Notes (Signed)
Called to patients room by RN.  Patient has expiratory audible wheezing and WOB has increased since last trach check and neb.  Gave patient Albuterol PRN neb and followed with suctioning her trach.  Patient has recently been cleaned and moved around and any activity causes this type of reaction with patient requiring PRN neb. RT will continue to monitor and follow up with patient for her 0400 trach check and neb treatment.

## 2020-10-03 NOTE — Progress Notes (Signed)
  Echocardiogram 2D Echocardiogram has been performed.  Roosvelt Maser F 10/03/2020, 5:05 PM

## 2020-10-03 NOTE — Progress Notes (Signed)
PROGRESS NOTE                                                                                                                                                                                                             Patient Demographics:    Sonia Drake, is a 65 y.o. female, DOB - 1956/04/13, WUJ:[id number]  Outpatient Primary MD for the patient is Eloisa Northern, MD    LOS - 2  Admit date - 31-Oct-2020    Chief Complaint  Patient presents with   Respiratory Distress       Brief Narrative (HPI from H&P)  Sonia Drake is a 65 y.o. female with medical history significant of asthma/COPD, CHF, cardiac arrest with PEA/CPR with chronic respiratory failure on permanent trach with supplemental oxygen at 6L,  type 2 DM, GERD, seizures and severe hypoxic-ischemic encephalopathy who presented to ER via EMS for respiratory distress from SNF.   Subjective:    Sonia Drake today main symptom bed trached and pegged, baseline anoxic brain injury and unresponsive, unable to answer questions or follow commands.   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  Now suspicious for sepsis due to aspiration pneumonia causing gram-positive bacteremia 2 out of 2 with staph aureus, currently on broad-spectrum antibiotics and sepsis pathophysiology has resolved, PCCM is following.  Long-term prognosis is extremely poor due to underlying anoxic brain injury and poor functional status, will continue supportive care and monitor.  Family wants aggressive measures for now.  Note at baseline patient wears a trach collar with 6 L per minute. VQ scan ordered upon admission - pending.  Clinical suspicion of PE is very low.  SpO2: 99 % O2 Flow Rate (L/min): 8 L/min FiO2 (%): 35 %  Recent Labs  Lab 10-31-20 1004 10/31/20 1126 2020-10-31 1127 Oct 31, 2020 1248 2020-10-31 1532 10-31-20 1618  10-31-20 1831 10/02/20 0758 10/03/20 0654  WBC  --   --   --  11.0*  --   --  16.1* 10.7* 9.5  HGB  --   --   --  11.7*  --   --  11.9* 10.4* 9.2*  HCT  --   --   --  38.5  --   --  39.6 32.8* 30.9*  PLT  --   --   --  328  --   --  302 281 279  CRP  --   --   --   --   --   --   --  6.9* 2.8*  BNP  --   --   --   --   --  21.5  --  63.9 104.1*  DDIMER  --   --  1.47*  --   --   --   --   --   --   PROCALCITON  --   --   --   --   --   --   --  <0.10  --   AST  --   --  50*  --   --   --   --  18 20  ALT  --   --  28  --   --   --   --  21 25  ALKPHOS  --   --  112  --   --   --   --  94 88  BILITOT  --   --  0.8  --   --   --   --  0.3 0.4  ALBUMIN  --   --  3.0*  --   --   --   --  2.6* 2.6*  INR  --   --  1.0  --   --   --   --   --   --   LATICACIDVEN  --  3.6*  --   --  2.1*  --   --   --   --   SARSCOV2NAA NEGATIVE  --   --   --   --   --   --   --   --      2.  Anoxic brain injury due to PEA arrest in the past (August 2021 - asthma attack induced) .  Trached and pegged at baseline.  Minimal to no response to verbal stimuli only responds to painful stimuli.  Poor functional status with history of seizures.  Continue supportive care with Keppra.  Poor prognosis.  Had a detailed discussion with patient's daughter she is willing to listen to what neurology has to say about her long-term outlook.  Will obtain neurology input although I have told her that long-term prognosis not look good and chances of meaningful recovery are minimal to none.  Case discussed with neurology 10/03/2020 we will see the patient shortly and placed a note in the chart.  3.  Chronic diastolic CHF EF 60% on echocardiogram in December 2021.  Currently dehydrated. Has been adequately hydrated with IV fluids continue to monitor.  4.  IV PPI.  5.  History of seizures with anoxic brain injury.  On Keppra.  6.  COPD/ Asthma - stable, supportive Rx.  7. DM type II.  On sliding scale and Lantus.  Feeding via PEG  tube  Lab Results  Component Value Date   HGBA1C 7.0 (H) 10/13/2020   CBG (last 3)  Recent Labs    10/02/20 2043 10/03/20 0438 10/03/20 0807  GLUCAP 179* 215* 219*     Nutrition Problem: Nutrition Problem: Increased nutrient needs Etiology: wound healing Signs/Symptoms: estimated needs Interventions: Tube feeding  Obesity: Estimated body mass index is 36.98 kg/m as calculated from the following:   Height as of this encounter: 5\' 2"  (1.575 m).   Weight as of this encounter: 91.7 kg.  Condition - Extremely Guarded  Family Communication  : Daughter [name] [phone] on 10/02/20  Code Status :  Full  Consults  :  PCCM, Neuro  PUD Prophylaxis : PPI   Procedures  :      VQ -       Disposition Plan  :    Status is: Inpatient  Remains inpatient appropriate because:IV treatments appropriate due to intensity of illness or inability to take PO  Dispo: The patient is from: SNF              Anticipated d/c is to: SNF              Patient currently is not medically stable to d/c.   Difficult to place patient No  DVT Prophylaxis  :         Lab Results  Component Value Date   PLT 279 10/03/2020    Diet :  Diet Order             Diet NPO time specified  Diet effective now                    Inpatient Medications  Scheduled Meds:  budesonide  0.5 mg Nebulization BID   chlorhexidine  15 mL Mouth/Throat BID   clonazePAM  0.5 mg Per Tube BID   clopidogrel  75 mg Per Tube Daily   enoxaparin (LOVENOX) injection  0.5 mg/kg Subcutaneous Q24H   famotidine  20 mg Per Tube BID   feeding supplement (PROSource TF)  45 mL Per Tube BID   insulin aspart  0-15 Units Subcutaneous TID WC   insulin glargine  40 Units Subcutaneous QHS   ipratropium-albuterol  3 mL Nebulization Q4H   levETIRAcetam  2,500 mg Per Tube BID   loratadine  10 mg Per Tube Daily   methylPREDNISolone (SOLU-MEDROL) injection  60 mg Intravenous Q6H   montelukast  10 mg Per  Tube Daily   pantoprazole (PROTONIX) IV  40 mg Intravenous Q24H   potassium chloride  20 mEq Per Tube Daily   senna-docusate  1 tablet Per Tube q AM   Continuous Infusions:  ceFEPime (MAXIPIME) IV 2 g (10/03/20 0531)   feeding supplement (JEVITY 1.5 CAL/FIBER) 1,000 mL (10/02/20 1604)   metronidazole 500 mg (10/03/20 0532)   vancomycin 1,250 mg (10/02/20 1606)   PRN Meds:.albuterol  Antibiotics  :    Anti-infectives (From admission, onward)    Start     Dose/Rate Route Frequency Ordered Stop   10/02/20 1400  vancomycin (VANCOREADY) IVPB 1250 mg/250 mL        1,250 mg 166.7 mL/hr over 90 Minutes Intravenous Every 24 hours 10/08/2020 1313     09/30/2020 2200  ceFEPIme (MAXIPIME) 2 g in sodium chloride 0.9 % 100 mL IVPB        2 g 200 mL/hr over 30 Minutes Intravenous Every 8 hours 10/18/2020 1313     10/16/2020 2200  metroNIDAZOLE (FLAGYL) IVPB 500 mg        500 mg 100 mL/hr over 60 Minutes Intravenous Every 8 hours 10/13/2020 1616     10/19/2020 1330  vancomycin (VANCOREADY) IVPB 2000 mg/400 mL        2,000 mg 200 mL/hr over 120 Minutes Intravenous  Once 09/20/2020 1257 09/25/2020 1550   10/05/2020 1300  ceFEPIme (MAXIPIME) 2 g in sodium chloride 0.9 % 100 mL IVPB        2 g 200 mL/hr over 30 Minutes Intravenous  Once  10-10-20 1257 10/10/2020 1330   10-10-2020 1300  metroNIDAZOLE (FLAGYL) IVPB 500 mg        500 mg 100 mL/hr over 60 Minutes Intravenous  Once 10-10-20 1257 10/10/2020 1443        Time Spent in minutes  30   Susa Raring M.D on 10/03/2020 at 11:00 AM  To page go to [email or web address]   [facility] -  Office  [phone]     See all Orders from today for further details    Objective:   Vitals:   10/03/20 0738 10/03/20 0808 10/03/20 0810 10/03/20 0906  BP: (!) 132/59   140/67  Pulse: 61 89 95 98  Resp: 18 (!) 22 (!) 29 20  Temp: 98 F (36.7 C)   98 F (36.7 C)  TempSrc: Axillary   Axillary  SpO2: 100% 99%  99%  Weight:      Height:        Wt Readings from  Last 3 Encounters:  10/03/20 91.7 kg  09/08/19 96.6 kg  08/30/19 99.7 kg     Intake/Output Summary (Last 24 hours) at 10/03/2020 1100 Last data filed at 10/03/2020 0400 Gross per 24 hour  Intake 3299.73 ml  Output 702 ml  Net 2597.73 ml     Physical Exam  Somnolent , responsive to painful stimuli only, Trach and PEG in place, some infrequent myoclonic jerks [place].AT,PERRAL Supple Neck,No JVD, No cervical lymphadenopathy appriciated.  Symmetrical Chest wall movement, Good air movement bilaterally, CTAB RRR,No Gallops, Rubs or new Murmurs, No Parasternal Heave +ve B.Sounds, Abd Soft, No tenderness, No organomegaly appriciated, No rebound - guarding or rigidity. No Cyanosis, Clubbing or edema, No new Rash or bruise   RN pressure injury documentation:  Pressure Injury 10/02/20 Buttocks Bilateral Stage 2 -  Partial thickness loss of dermis presenting as a shallow open injury with a red, pink wound bed without slough. (Active)  10/02/20 0300  Location: Buttocks  Location Orientation: Bilateral  Staging: Stage 2 -  Partial thickness loss of dermis presenting as a shallow open injury with a red, pink wound bed without slough.  Wound Description (Comments):   Present on Admission: Yes     Data Review:    CBC Recent Labs  Lab 2020-10-10 1248 2020-10-10 1831 10/02/20 0758 10/03/20 0654  WBC 11.0* 16.1* 10.7* 9.5  HGB 11.7* 11.9* 10.4* 9.2*  HCT 38.5 39.6 32.8* 30.9*  PLT 328 302 281 279  MCV 87.1 87.4 85.0 88.0  MCH 26.5 26.3 26.9 26.2  MCHC 30.4 30.1 31.7 29.8*  RDW 15.1 15.0 15.0 15.2  LYMPHSABS 1.7  --   --  0.9  MONOABS 1.0  --   --  0.4  EOSABS 0.2  --   --  0.0  BASOSABS 0.1  --   --  0.0    Recent Labs  Lab 10-10-20 1126 Oct 10, 2020 1127 10-10-20 1532 October 10, 2020 1618 10-Oct-2020 1831 10/02/20 0758 10/03/20 0654  NA  --  137  --   --   --  139 138  K  --  5.2*  --   --   --  3.5 4.2  CL  --  100  --   --   --  104 108  CO2  --  27  --   --   --  28 23  GLUCOSE  --   202*  --   --   --  91 241*  BUN  --  16  --   --   --  9 14  CREATININE  --  0.57  --   --  0.43* 0.45 0.43*  CALCIUM  --  10.0  --   --   --  10.1 10.1  AST  --  50*  --   --   --  18 20  ALT  --  28  --   --   --  21 25  ALKPHOS  --  112  --   --   --  94 88  BILITOT  --  0.8  --   --   --  0.3 0.4  ALBUMIN  --  3.0*  --   --   --  2.6* 2.6*  MG  --   --   --   --   --  1.7 2.0  CRP  --   --   --   --   --  6.9* 2.8*  DDIMER  --  1.47*  --   --   --   --   --   PROCALCITON  --   --   --   --   --  <0.10  --   LATICACIDVEN 3.6*  --  2.1*  --   --   --   --   INR  --  1.0  --   --   --   --   --   HGBA1C  --   --   --   --  7.0*  --   --   BNP  --   --   --  21.5  --  63.9 104.1*    ------------------------------------------------------------------------------------------------------------------ No results for input(s): CHOL, HDL, LDLCALC, TRIG, CHOLHDL, LDLDIRECT in the last 72 hours.  Lab Results  Component Value Date   HGBA1C 7.0 (H) 10/17/2020   ------------------------------------------------------------------------------------------------------------------ No results for input(s): TSH, T4TOTAL, T3FREE, THYROIDAB in the last 72 hours.  Invalid input(s): FREET3  Cardiac Enzymes No results for input(s): CKMB, TROPONINI, MYOGLOBIN in the last 168 hours.  Invalid input(s): CK ------------------------------------------------------------------------------------------------------------------    Component Value Date/Time   BNP 104.1 (H) 10/03/2020 0654    Micro Results Recent Results (from the past 240 hour(s))  Resp Panel by RT-PCR (Flu A&B, Covid) Nasopharyngeal Swab     Status: None   Collection Time: 10/19/2020 10:04 AM   Specimen: Nasopharyngeal Swab; Nasopharyngeal(NP) swabs in vial transport medium  Result Value Ref Range Status   SARS Coronavirus 2 by RT PCR NEGATIVE NEGATIVE Final    Comment: (NOTE) SARS-CoV-2 target nucleic acids are NOT DETECTED.  The  SARS-CoV-2 RNA is generally detectable in upper respiratory specimens during the acute phase of infection. The lowest concentration of SARS-CoV-2 viral copies this assay can detect is 138 copies/mL. A negative result does not preclude SARS-Cov-2 infection and should not be used as the sole basis for treatment or other patient management decisions. A negative result may occur with  improper specimen collection/handling, submission of specimen other than nasopharyngeal swab, presence of viral mutation(s) within the areas targeted by this assay, and inadequate number of viral copies(<138 copies/mL). A negative result must be combined with clinical observations, patient history, and epidemiological information. The expected result is Negative.  Fact Sheet for Patients:  BloggerCourse.com  Fact Sheet for Healthcare Providers:  SeriousBroker.it  This test is no t yet approved or cleared by the [place] FDA and  has been authorized for detection and/or diagnosis of SARS-CoV-2 by FDA under an Emergency Use Authorization (EUA). This EUA will remain  in effect (meaning this test can be used) for the duration of the COVID-19 declaration under Section 564(b)(1) of the Act, 21 U.S.C.section 360bbb-3(b)(1), unless the authorization is terminated  or revoked sooner.       Influenza A by PCR NEGATIVE NEGATIVE Final   Influenza B by PCR NEGATIVE NEGATIVE Final    Comment: (NOTE) The Xpert Xpress SARS-CoV-2/FLU/RSV plus assay is intended as an aid in the diagnosis of influenza from Nasopharyngeal swab specimens and should not be used as a sole basis for treatment. Nasal washings and aspirates are unacceptable for Xpert Xpress SARS-CoV-2/FLU/RSV testing.  Fact Sheet for Patients: BloggerCourse.comhttps://www.fda.gov/media/152166/download  Fact Sheet for Healthcare Providers: SeriousBroker.ithttps://www.fda.gov/media/152162/download  This test is not yet approved or  cleared by the [place] FDA and has been authorized for detection and/or diagnosis of SARS-CoV-2 by FDA under an Emergency Use Authorization (EUA). This EUA will remain in effect (meaning this test can be used) for the duration of the COVID-19 declaration under Section 564(b)(1) of the Act, 21 U.S.C. section 360bbb-3(b)(1), unless the authorization is terminated or revoked.  Performed at [facility], [address] [place], [place] [address]   Blood Culture (routine x 2)     Status: None (Preliminary result)   Collection Time: 10/07/2020 10:04 AM   Specimen: BLOOD  Result Value Ref Range Status   Specimen Description BLOOD RIGHT ANTECUBITAL  Final   Special Requests   Final    BOTTLES DRAWN AEROBIC AND ANAEROBIC Blood Culture results may not be optimal due to an inadequate volume of blood received in culture bottles   Culture  Setup Time   Final    GRAM POSITIVE COCCI IN CLUSTERS IN BOTH AEROBIC AND ANAEROBIC BOTTLES CRITICAL RESULT CALLED TO, READ BACK BY AND VERIFIED WITH: Loura BackHARM D J.FRIENS ON [id number] AT 0815 BY E.PARRISH Performed at [facility], [address] [place], [place] [address]    Culture GRAM POSITIVE COCCI  Final   Report Status PENDING  Incomplete  Urine culture     Status: Abnormal   Collection Time: 10/13/2020 10:04 AM   Specimen: In/Out Cath Urine  Result Value Ref Range Status   Specimen Description IN/OUT CATH URINE  Final   Special Requests   Final    NONE Performed at [facility], [address] [place], [place] [address]    Culture MULTIPLE SPECIES PRESENT, SUGGEST RECOLLECTION (A)  Final   Report Status 10/02/2020 FINAL  Final  Blood Culture ID Panel (Reflexed)     Status: Abnormal   Collection Time: 10/14/2020 10:04 AM  Result Value Ref Range Status   Enterococcus faecalis NOT DETECTED NOT DETECTED Final   Enterococcus Faecium NOT DETECTED NOT DETECTED Final   Listeria monocytogenes NOT DETECTED NOT DETECTED Final    Staphylococcus species DETECTED (A) NOT DETECTED Final    Comment: CRITICAL RESULT CALLED TO, READ BACK BY AND VERIFIED WITH: PHARM D J.FRIENS ON [id number] AT 0815 BY E.PARRISH    Staphylococcus aureus (BCID) NOT DETECTED NOT DETECTED Final   Staphylococcus epidermidis NOT DETECTED NOT DETECTED Final   Staphylococcus lugdunensis NOT DETECTED NOT DETECTED Final   Streptococcus species NOT DETECTED NOT DETECTED Final   Streptococcus agalactiae NOT DETECTED NOT DETECTED Final   Streptococcus pneumoniae NOT DETECTED NOT DETECTED Final   Streptococcus pyogenes NOT DETECTED NOT DETECTED Final   A.calcoaceticus-baumannii NOT DETECTED NOT DETECTED Final   Bacteroides fragilis NOT DETECTED NOT DETECTED Final   Enterobacterales NOT DETECTED NOT DETECTED Final  Enterobacter cloacae complex NOT DETECTED NOT DETECTED Final   Escherichia coli NOT DETECTED NOT DETECTED Final   Klebsiella aerogenes NOT DETECTED NOT DETECTED Final   Klebsiella oxytoca NOT DETECTED NOT DETECTED Final   Klebsiella pneumoniae NOT DETECTED NOT DETECTED Final   Proteus species NOT DETECTED NOT DETECTED Final   Salmonella species NOT DETECTED NOT DETECTED Final   Serratia marcescens NOT DETECTED NOT DETECTED Final   Haemophilus influenzae NOT DETECTED NOT DETECTED Final   Neisseria meningitidis NOT DETECTED NOT DETECTED Final   Pseudomonas aeruginosa NOT DETECTED NOT DETECTED Final   Stenotrophomonas maltophilia NOT DETECTED NOT DETECTED Final   Candida albicans NOT DETECTED NOT DETECTED Final   Candida auris NOT DETECTED NOT DETECTED Final   Candida glabrata NOT DETECTED NOT DETECTED Final   Candida krusei NOT DETECTED NOT DETECTED Final   Candida parapsilosis NOT DETECTED NOT DETECTED Final   Candida tropicalis NOT DETECTED NOT DETECTED Final   Cryptococcus neoformans/gattii NOT DETECTED NOT DETECTED Final    Comment: Performed at [facility], [address] [place], [place] [address]  Blood Culture (routine x  2)     Status: None (Preliminary result)   Collection Time: 10/11/2020 10:09 AM   Specimen: BLOOD  Result Value Ref Range Status   Specimen Description BLOOD LEFT ANTECUBITAL  Final   Special Requests   Final    BOTTLES DRAWN AEROBIC AND ANAEROBIC Blood Culture adequate volume   Culture  Setup Time   Final    GRAM POSITIVE COCCI IN CLUSTERS IN BOTH AEROBIC AND ANAEROBIC BOTTLES CRITICAL VALUE NOTED.  VALUE IS CONSISTENT WITH PREVIOUSLY REPORTED AND CALLED VALUE. Performed at [facility], [address] [place], [place] [address]    Culture GRAM POSITIVE COCCI  Final   Report Status PENDING  Incomplete  MRSA Next Gen by PCR, Nasal     Status: Abnormal   Collection Time: 10/02/20  7:31 AM  Result Value Ref Range Status   MRSA by PCR Next Gen DETECTED (A) NOT DETECTED Final    Comment: RESULT CALLED TO, READ BACK BY AND VERIFIED WITH: RN A.PURNAGE ON [id number] AT 1113 BY E.PARRISH (NOTE) The GeneXpert MRSA Assay (FDA approved for NASAL specimens only), is one component of a comprehensive MRSA colonization surveillance program. It is not intended to diagnose MRSA infection nor to guide or monitor treatment for MRSA infections. Test performance is not FDA approved in patients less than 62 years old. Performed at [facility], [address] [place], [place] [address]     Radiology Reports DG CHEST PORT 1 VIEW  Result Date: 10/02/2020 CLINICAL DATA:  Respiratory distress EXAM: PORTABLE CHEST 1 VIEW COMPARISON:  Film from earlier in the same day FINDINGS: Tracheostomy tube is noted in satisfactory position. Cardiac shadow is unremarkable. The lungs are clear bilaterally. No bony abnormality is seen. IMPRESSION: Acute abnormality noted. Electronically Signed   By: Alcide Clever M.D.   On: 10/02/2020 16:05   DG Chest Port 1 View  Result Date: 10/02/2020 CLINICAL DATA:  Shortness of breath EXAM: PORTABLE CHEST 1 VIEW COMPARISON:  October 01, 2020 FINDINGS: Evaluation is limited  secondary to patient rotation. The cardiomediastinal silhouette is unchanged in contour.Tracheostomy. No pleural effusion. No pneumothorax. LEFT retrocardiac opacity, likely atelectasis. Visualized abdomen is unremarkable. Multilevel degenerative changes of the thoracic spine. IMPRESSION: No acute cardiopulmonary abnormality. Electronically Signed   By: Meda Klinefelter MD   On: 10/02/2020 07:50   DG Chest Port 1 [address]  Result Date: 09/20/2020 CLINICAL DATA:  Possible sepsis.  Vomiting.  Hypoxia. EXAM: PORTABLE CHEST 1 VIEW COMPARISON:  01/17/2020 FINDINGS: Tracheostomy tube in adequate position. Lungs are adequately inflated without focal airspace consolidation or effusion. Cardiomediastinal silhouette and remainder of the exam is unchanged. IMPRESSION: No acute cardiopulmonary disease. Electronically Signed   By: Elberta Fortis M.D.   On: 09/30/2020 10:45   DG Abd Portable 1V  Result Date: 10/02/2020 CLINICAL DATA:  Nausea. EXAM: PORTABLE ABDOMEN - 1 VIEW COMPARISON:  12/25/2017 FINDINGS: Normal bowel gas pattern. No abnormal stool retention. Percutaneous gastrostomy tube in place. No concerning mass effect or calcification. Left hip arthroplasty. Lumbar spine degeneration and scoliosis IMPRESSION: Normal bowel gas pattern. Electronically Signed   By: Marnee Spring M.D.   On: 10/02/2020 09:16

## 2020-10-03 NOTE — Progress Notes (Addendum)
NAME:  Sonia Drake, MRN:  [id number], DOB:  [birth year], LOS: 2 ADMISSION DATE:  2020-10-21, CONSULTATION DATE:  10/03/2020 REFERRING MD:  Sonia Sea, MD, CHIEF COMPLAINT:  respiratory failure, vent management   History of Present Illness:  Sonia Drake is a 65 year old woman with history of cardiac arrest with anoxic brain injury status post tracheostomy in August 2021 who presents for new patient evaluation for [facility] skilled nursing facility for worsening respiratory failure.  She is on trach collar 6 L with humidification daily.  She was discharged to [facility] initially and then transferred to skilled nursing facility after ventilator liberation.  At baseline she has bedbound, nonverbal.  She had a coughing spell earlier today at [facility] and possible aspiration.  There were copious secretions suctioned from her tracheostomy.  She was transferred to [facility], ED for further evaluation.  Here she is found to be tachycardic, tachypneic, and having worsening hypoxemia.  Chest x-ray is obtained and shows no acute cardiopulmonary process.  She has been evaluated for sepsis and being ruled out for infection.  Pulmonary's been consulted to assist with respiratory failure and trach management in the setting of sepsis.  Pertinent  Medical History  COPD Seizure disorder Type 2 DM  Significant Hospital Events: Including procedures, antibiotic start and stop dates in addition to other pertinent events   6/12 admitted from skilled nursing facility after aspiration event and copious secretions per tracheostomy 6/13 appears comfortable currently on 35% ATC. No distress. WBC ct trending down. No events overnight growing GPC clusters in two of two cultures  Interim History / Subjective:   Requires frequent suctioning. Objective   Blood pressure 140/67, pulse 98, temperature 98 F (36.7 C), temperature source Axillary, resp. rate 20, height 5\' 2"  (1.575 m), weight 91.7 kg, SpO2 99 %.    FiO2  (%):  [35 %] 35 %   Intake/Output Summary (Last 24 hours) at 10/03/2020 0914 Last data filed at 10/03/2020 0400 Gross per 24 hour  Intake 3299.73 ml  Output 702 ml  Net 2597.73 ml   Filed Weights   10/02/20 1500 10/03/20 0444  Weight: 88.4 kg 91.7 kg    Examination: General: Poorly responsive female setting of long-term HEENT: Tracheostomy cuffed in place.  Copious white secretions Neuro: Extremely anoxic injury CV: Heart sounds are regular PULM:   GI: soft, bsx4 active   Extremities: warm/dry,  edema  Skin: no rashes or lesions   Labs/imaging that I havepersonally reviewed  (right click and "Reselect all SmartList Selections" daily)  See below  Resolved Hospital Problem list     Assessment & Plan:   Sonia Drake is a 65 y.o. woman with history of chronic respiratory failure s/p tracheostomy after PEA arrest. She is here from SNF for worsening shortness of breath and possible aspiration event. PCCM consulted for respiratory failure/vent management.  Trach dependence w/ Acute on chronic hypoxemic respiratory failure 2/2 probable aspiration event vs transient mucous plugging  Asthma COPD overlap syndrome with exacerbation/  Aspiration pneumonitis  Sepsis w/ GPC bacteremia Anoxic encephalopathy s/p cardiac arrest  Discussion Currently on antimicrobial therapy Copious thin white secretions Requires frequent frequent suctioning  Plan Suction as needed  Questionable scopolamine patch If secretions continued he may need transfer to intensive care unit Monitor culture data Continue current antimicrobial therapy  71 Minor ACNP Acute Care Nurse Practitioner Sonia Drake Pulmonary/Critical Care Please consult Amion 10/03/2020, 9:21 AM     Attending attestation:  Ms. Sonia Drake is a 65 y/o woman  with a history of asthma/ COPD overlap, cardiac arrest with anoxic brain injury, chronic respiratory failure requiring tracheostomy who presented with worse SOB, tracheal  secretions. Yesterday was requiring nebulizers about every 3 hours. She is unable to give additional history due to chronic encephalopathy.  Per RN cannot go get VQ scan because she desaturates when supine.  BP (!) 143/82 (BP Location: Right Arm)   Pulse 93   Temp 98.5 F (36.9 C) (Axillary)   Resp (!) 21   Ht 5\' 2"  (1.575 m)   Wt 91.7 kg   SpO2 100%   BMI 36.98 kg/m   Chronically ill appearing woman sitting up in bed [place]/AT, eyes anicteric.  Trach with frothy white secretions from trach Tachypnea, no accessory muscle use.  S1S2, RRR Abd soft, NT Mild edema, no cyanosis Nonresponsive to verbal stimulation, strong cough reflex with tracheal suctioning  BNP 104  Assessment & plan:  Acute on chronic respiratory failure with hypoxia, concern with frothy secretions from trach that this is pulm edema in addition to acute asthma exacerbation. Elevated BNP. -O2 via trach collar -con't high dose steroids, frequent BD -recommend diuresis and stopping maintenance fluids -con't routine trach care -ok to delay VQ scan; could use empiric AC in the interim. She has a lower D-dimer level compared to a month ago, although has significant risk with chronic immobility  We will continue to follow intermittently.  , DO 10/03/20 12:43 PM [facility]

## 2020-10-04 ENCOUNTER — Inpatient Hospital Stay (HOSPITAL_COMMUNITY): Payer: Medicare Other

## 2020-10-04 DIAGNOSIS — J449 Chronic obstructive pulmonary disease, unspecified: Secondary | ICD-10-CM

## 2020-10-04 DIAGNOSIS — R569 Unspecified convulsions: Secondary | ICD-10-CM | POA: Diagnosis not present

## 2020-10-04 LAB — CBC WITH DIFFERENTIAL/PLATELET
Abs Immature Granulocytes: 0.2 10*3/uL — ABNORMAL HIGH (ref 0.00–0.07)
Basophils Absolute: 0 10*3/uL (ref 0.0–0.1)
Basophils Relative: 0 %
Eosinophils Absolute: 0 10*3/uL (ref 0.0–0.5)
Eosinophils Relative: 0 %
HCT: 30.3 % — ABNORMAL LOW (ref 36.0–46.0)
Hemoglobin: 9.4 g/dL — ABNORMAL LOW (ref 12.0–15.0)
Immature Granulocytes: 2 %
Lymphocytes Relative: 8 %
Lymphs Abs: 0.9 10*3/uL (ref 0.7–4.0)
MCH: 27 pg (ref 26.0–34.0)
MCHC: 31 g/dL (ref 30.0–36.0)
MCV: 87.1 fL (ref 80.0–100.0)
Monocytes Absolute: 0.4 10*3/uL (ref 0.1–1.0)
Monocytes Relative: 4 %
Neutro Abs: 9.3 10*3/uL — ABNORMAL HIGH (ref 1.7–7.7)
Neutrophils Relative %: 86 %
Platelets: 262 10*3/uL (ref 150–400)
RBC: 3.48 MIL/uL — ABNORMAL LOW (ref 3.87–5.11)
RDW: 15.3 % (ref 11.5–15.5)
WBC: 10.9 10*3/uL — ABNORMAL HIGH (ref 4.0–10.5)
nRBC: 0 % (ref 0.0–0.2)

## 2020-10-04 LAB — COMPREHENSIVE METABOLIC PANEL
ALT: 26 U/L (ref 0–44)
AST: 17 U/L (ref 15–41)
Albumin: 2.5 g/dL — ABNORMAL LOW (ref 3.5–5.0)
Alkaline Phosphatase: 79 U/L (ref 38–126)
Anion gap: 6 (ref 5–15)
BUN: 14 mg/dL (ref 8–23)
CO2: 25 mmol/L (ref 22–32)
Calcium: 10.2 mg/dL (ref 8.9–10.3)
Chloride: 110 mmol/L (ref 98–111)
Creatinine, Ser: 0.49 mg/dL (ref 0.44–1.00)
GFR, Estimated: >60 mL/min (ref >60)
Glucose, Bld: 235 mg/dL — ABNORMAL HIGH (ref 70–99)
Potassium: 4.3 mmol/L (ref 3.5–5.1)
Sodium: 141 mmol/L (ref 135–145)
Total Bilirubin: 0.2 mg/dL — ABNORMAL LOW (ref 0.3–1.2)
Total Protein: 5.9 g/dL — ABNORMAL LOW (ref 6.5–8.1)

## 2020-10-04 LAB — GLUCOSE, CAPILLARY
Glucose-Capillary: 275 mg/dL — ABNORMAL HIGH (ref 70–99)
Glucose-Capillary: 268 mg/dL — ABNORMAL HIGH (ref 70–99)
Glucose-Capillary: 312 mg/dL — ABNORMAL HIGH (ref 70–99)
Glucose-Capillary: 220 mg/dL — ABNORMAL HIGH (ref 70–99)
Glucose-Capillary: 213 mg/dL — ABNORMAL HIGH (ref 70–99)

## 2020-10-04 LAB — MAGNESIUM: Magnesium: 2 mg/dL (ref 1.7–2.4)

## 2020-10-04 LAB — PROCALCITONIN: Procalcitonin: <0.1 ng/mL

## 2020-10-04 LAB — C-REACTIVE PROTEIN: CRP: 0.8 mg/dL (ref <1.0)

## 2020-10-04 LAB — BRAIN NATRIURETIC PEPTIDE: B Natriuretic Peptide: 145.8 pg/mL — ABNORMAL HIGH (ref 0.0–100.0)

## 2020-10-04 LAB — D-DIMER, QUANTITATIVE: D-Dimer, Quant: 0.88 ug/mL-FEU — ABNORMAL HIGH (ref 0.00–0.50)

## 2020-10-04 MED ORDER — FUROSEMIDE 10 MG/ML IJ SOLN
40.0000 mg | Freq: Once | INTRAMUSCULAR | Status: AC
  Administered 2020-10-04: 40 mg via INTRAVENOUS
  Filled 2020-10-04: qty 4

## 2020-10-04 MED ORDER — FREE WATER
150.0000 mL | Status: DC
  Administered 2020-10-04 – 2020-10-09 (×29): 150 mL

## 2020-10-04 MED ORDER — TECHNETIUM TO 99M ALBUMIN AGGREGATED
4.3000 | Freq: Once | INTRAVENOUS | Status: AC | PRN
  Administered 2020-10-04: 4.3 via INTRAVENOUS

## 2020-10-04 NOTE — Progress Notes (Signed)
NAME:  PARISSA CHIAO, MRN:  [id number], DOB:  08-03-1955, LOS: 3 ADMISSION DATE:  09/20/2020, CONSULTATION DATE:  10/04/2020 REFERRING MD:  Leroy Sea, MD, CHIEF COMPLAINT:  respiratory failure, vent management   History of Present Illness:  Aleigh Grunden is a 65 year old woman with history of cardiac arrest with anoxic brain injury status post tracheostomy in August 2021 who presents for new patient evaluation for [facility] skilled nursing facility for worsening respiratory failure.  She is on trach collar 6 L with humidification daily.  She was discharged to [facility] initially and then transferred to skilled nursing facility after ventilator liberation.  At baseline she has bedbound, nonverbal.  She had a coughing spell earlier today at [facility] and possible aspiration.  There were copious secretions suctioned from her tracheostomy.  She was transferred to [facility], ED for further evaluation.  Here she is found to be tachycardic, tachypneic, and having worsening hypoxemia.  Chest x-ray is obtained and shows no acute cardiopulmonary process.  She has been evaluated for sepsis and being ruled out for infection.  Pulmonary's been consulted to assist with respiratory failure and trach management in the setting of sepsis.  Pertinent  Medical History  COPD Seizure disorder Type 2 DM  Significant Hospital Events: Including procedures, antibiotic start and stop dates in addition to other pertinent events   6/12 admitted from skilled nursing facility after aspiration event and copious secretions per tracheostomy 6/13 appears comfortable currently on 35% ATC. No distress. WBC ct trending down. No events overnight growing GPC clusters in two of two cultures  6/15 getting VQ scan. Reduced WOB. Interim History / Subjective:  Breathing more controlled today per RN. Not in respiratory distress before each neb today. Still having some secretions from trach.   Objective   Blood pressure (!) 161/95,  pulse (!) 117, temperature 99.2 F (37.3 C), temperature source Oral, resp. rate (!) 30, height 5\' 2"  (1.575 m), weight 92.7 kg, SpO2 99 %.    FiO2 (%):  [35 %-80 %] 60 %   Intake/Output Summary (Last 24 hours) at 10/04/2020 1300 Last data filed at 10/03/2020 1826 Gross per 24 hour  Intake --  Output 400 ml  Net -400 ml    Filed Weights   10/02/20 1500 10/03/20 0444 10/04/20 0400  Weight: 88.4 kg 91.7 kg 92.7 kg    Examination: General: chronically ill appearing woman lying in bed in NAD HEENT: [place]/AT, eyes anicteric Neck: [place] XLT cuffed with deflated balloon in place.  Resp: rhonchi bilaterally. Mild thin frothy secretions from trach, improved since yesterday. No accessory muscle use, no wheezing, tachypnea more controlled compared to 2 previous assessments this week. Neuro: Eyes closed, not arousing to stimulation. Similar to previous exams. CV: S1S2, RRR GI: PEG, soft, NT Extremities: no significant pretibial edema, mild hand edema. No cyanosis Skin: no wounds or rashes   Labs/imaging that I havepersonally reviewed  (right click and "Reselect all SmartList Selections" daily)  D-dimer 0.88 VQ scan pending CXR> hyperinflated, left hemidiaphragm silhouetted-- possibly pneumonia vs positioning.  Trach aspirate> pseudomonas, Klebsiella, rare WBCs Blood cultures 6/12> GPC, CONS 4/4 Blood cultures 6/13> NGTD Resolved Hospital Problem list     Assessment & Plan:   TAMICKA SHIMON is a 65 y.o. woman with history of chronic respiratory failure s/p tracheostomy following PEA arrest. She is here from SNF for worsening shortness of breath and possible aspiration event. Her presentation has been most consistent with an acute asthma exacerbation.  Trach dependence-- trach on  12/24/19 at [facility] Acute on chronic respiratory failure with hypoxia Asthma COPD overlap syndrome with exacerbation Aspiration pneumonitis; agree that sputum culture likely demonstrates colonization with few WBCs.   Sepsis w/ GPC bacteremia vs contaminant Anoxic encephalopathy s/p cardiac arrest-- chronic baseline  -agree with diuresis -VQ scan today; RN will ensure she can safely lay flat prior to transport to radiology -O2 via trach collar; wean as able -Con't high dose steroids, frequent BD-- hopefully can start to deescalate steroids tomorrow if she continues to improve -Ok to monitor off antibiotics and continue to follow cultures.  -Con't routine trach care; would like to downsize to cuffless trach before discharge -overall improving, hopefully back to SNF before next week  We will continue to follow intermittently. Please call with questions in the interim.  Steffanie Dunn, DO 10/04/20 1:00 PM [facility]

## 2020-10-04 NOTE — Progress Notes (Signed)
Inpatient Diabetes Program Recommendations  AACE/ADA: New Consensus Statement on Inpatient Glycemic Control (2015)  Target Ranges:  Prepandial:   less than 140 mg/dL      Peak postprandial:   less than 180 mg/dL (1-2 hours)      Critically ill patients:  140 - 180 mg/dL   Lab Results  Component Value Date   GLUCAP 268 (H) 10/04/2020   HGBA1C 7.0 (H) Oct 04, 2020    Review of Glycemic Control  Diabetes history: DM2 Outpatient Diabetes medications: Lantus 40 QD, Humalog 2-12 units TID Current orders for Inpatient glycemic control: Lantus 40 units QHS, Novolog 0-15 units TID On Solumedrol 60 Q6H On TFs @ 45/H  Inpatient Diabetes Program Recommendations:    Change Novolog to 0-15 units Q4H Add Novolog 2 units Q4H for TF coverage. Do not give if TF HELD for any reason  Continue to follow.  Thank you. Ailene Ards, RD, LDN, CDE Inpatient Diabetes Coordinator [phone]

## 2020-10-04 NOTE — Progress Notes (Signed)
Patient is now currently Yellow MEWS from previous Red MEWS status. Md aware and notified. Orders to page RT for increase in O2 and PRN neb. RT [place] notified. Will continue to monitor.

## 2020-10-04 NOTE — Progress Notes (Signed)
Nutrition Follow-up  DOCUMENTATION CODES:   Not applicable  INTERVENTION:   -Continue Jevity 1.5 @ 45 ml/hr via PEG  45 ml Prosource TF BID.    150 ml free water flush every 4 hours   Tube feeding regimen provides 1700 kcal (100% of needs), 91 grams of protein, and 821 ml of H2O.  Total free water: 1721 ml daily  NUTRITION DIAGNOSIS:   Increased nutrient needs related to wound healing as evidenced by estimated needs.  Ongoing  GOAL:   Patient will meet greater than or equal to 90% of their needs  Met with TF  MONITOR:   Labs, Weight trends, TF tolerance, Skin, I & O's  REASON FOR ASSESSMENT:   Consult Enteral/tube feeding initiation and management  ASSESSMENT:   Sonia Drake is a 65 y.o. female with medical history significant of asthma/COPD, CHF, cardiac arrest with PEA/CPR with chronic respiratory failure on permanent trach with supplemental oxygen at 6L,  type 2 DM, GERD, seizures and severe hypoxic-ischemic encephalopathy who presented to ER via EMS for respiratory distress from SNF.  Reviewed I/O's: -400 ml x 24 hours and +6.8 L since admission  UOP: 400 ml x 24 hours  Per PCCM notes, plan for VQ scan today. Also, possible trach downsize prior to discharge.   Pt remains on TF and tolerating well.   Noted DM coordinator recommendations for hyperglycemia.   Medications reviewed and include keppra, senna, and solumedrol.   Labs reviewed: CBGS: [id number] (inpatient orders for glycemic control are 0-15 units insulin aspart TID with meals and 40 units insulin glargine daily at bedtime).    Diet Order:   Diet Order             Diet NPO time specified  Diet effective now                   EDUCATION NEEDS:   No education needs have been identified at this time  Skin:  Skin Assessment: Skin Integrity Issues: Skin Integrity Issues:: Stage II Stage II: buttocks  Last BM:  10/04/20  Height:   Ht Readings from Last 1 Encounters:  10/03/20 5' 2"   (1.575 m)    Weight:   Wt Readings from Last 1 Encounters:  10/04/20 92.7 kg    Ideal Body Weight:  45.5 kg  BMI:  Body mass index is 37.38 kg/m.  Estimated Nutritional Needs:   Kcal:  1600-1800  Protein:  90-105 grams  Fluid:  > 1.6 L    Loistine Chance, RD, LDN, [place] Registered Dietitian II Certified Diabetes Care and Education Specialist Please refer to [facility] for RD and/or RD on-call/weekend/after hours pager

## 2020-10-04 NOTE — Progress Notes (Signed)
Patient transferred to Nuclear Medicine for procedure at this time. Swat nurse traveled with patient, handoff completed.

## 2020-10-04 NOTE — Consult Note (Signed)
[facility]       Reason for Consult: aspiration pneumonia    Referring Physician: Dr. Thedore Mins  Principal Problem:   Sepsis [facility]) Active Problems:   COPD (chronic obstructive pulmonary disease) (HCC)   Chronic diastolic CHF (congestive heart failure) (HCC)   Acute on chronic respiratory failure with hypoxia (HCC)   GERD (gastroesophageal reflux disease)   Seizures (HCC)   Type 2 diabetes mellitus without complication, with long-term current use of insulin (HCC)   Severe hypoxic-ischemic encephalopathy   Tracheostomy dependence (HCC)   Pressure injury of skin    budesonide  0.5 mg Nebulization BID   chlorhexidine  15 mL Mouth/Throat BID   clonazePAM  0.5 mg Per Tube BID   clopidogrel  75 mg Per Tube Daily   enoxaparin (LOVENOX) injection  0.5 mg/kg Subcutaneous Q24H   famotidine  20 mg Per Tube BID   feeding supplement (PROSource TF)  45 mL Per Tube BID   insulin aspart  0-15 Units Subcutaneous TID WC   insulin glargine  40 Units Subcutaneous QHS   ipratropium-albuterol  3 mL Nebulization Q4H   levETIRAcetam  2,500 mg Per Tube BID   loratadine  10 mg Per Tube Daily   methylPREDNISolone (SOLU-MEDROL) injection  60 mg Intravenous Q6H   montelukast  10 mg Per Tube Daily   pantoprazole (PROTONIX) IV  40 mg Intravenous Q24H   potassium chloride  20 mEq Per Tube Daily   senna-docusate  1 tablet Per Tube q AM    Recommendations:  I will stop antibiotics  Assessment: She has an anoxic brain injury requiring chronic supportive devices including trach and PEG and chronically bedbound.  No meaningful recovery expected.  She came in with concern for worsening hypoxia and reportedly had vomited prior to admission and ended up with increased oxygen needs and desaturation to the 80s.  I agree, most likely with some pneumonitis, possible aspiration pneumonia, though with a negative procaclitonin, I doubt infectious.  Regardless she has been treated with broad  antibiotics now day 4 and WBC declined from 16.1 to 10.9 and her secretions have decreased.  Blood cultures with multiple organisms and this is most c/w contamination due to her bedbound, NH status and regardless, has been on antibiotics and treated.  Additionally her trach aspirate with two different organisms of unclear significance.  With a chronic trach, she is likely colonized with bacteria.  At this time, no significant concerns with infection so will stop antibiotics.    Antibiotics: Vancomycin, cefepime, metronidazole.    HPI: Sonia Drake is a 65 y.o. female with a history of cardiac arrest s/p anoxic brain injury with a chronic trach on 6 L oxygen at baseline and a PEG tube placement.  She has been at [facility] and recently transferred to [facility] in April of this year.  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  History from the chart.  No family at bedside. Nursing reports copious secretions yesterday but much improved today.  Blood cultures have grown multiple coagulase negative organisms including Staph epidermidis, Staph capitis and Staph hominis.  Respiratory culture with rare Pseudomonas and few Klebsiella.  Procaclitonin wnl.     Review of Systems:  Unable to be assessed due to mental status   Past Medical History:  Diagnosis Date   Arthritis    "right knee; left hip" (04/07/2017)   Asthma    CHF (congestive heart failure) (HCC)    reported treatment for fluid  overload in the setting of asthma exacerbation ~ 1998   COPD (chronic obstructive pulmonary disease) (HCC)    patient states she does not have COPD but was treated for it.    Depression    "from chemical imbalance" (04/07/2017)   Dyspnea    Dysrhythmia    occasional skipped beats   GERD (gastroesophageal reflux disease)    On home oxygen therapy    "I sleep with 2L" (04/07/2017)   Pneumonia    "several times" (04/07/2017)   Seizures (HCC) 2003 X 1   unknown cause    Social History   Tobacco Use    Smoking status: Never   Smokeless tobacco: Never  Vaping Use   Vaping Use: Never used  Substance Use Topics   Alcohol use: No   Drug use: No    Family History  Problem Relation Age of Onset   Heart disease Mother    Heart disease Father    Diabetes Father    Asthma Father    Diabetes Brother    Diabetes Sister    Asthma Brother    Asthma Sister    Hypertension Sister    Heart failure Brother    Hypertension Brother    Diabetes Brother    Heart failure Brother    Hypertension Brother    Diabetes Brother    Hypertension Brother    Hyperlipidemia Brother     Allergies  Allergen Reactions   Contrast Media [Iodinated Diagnostic Agents] Anaphylaxis and Other (See Comments)    ** CARDIAC ARREST **   Peanuts [Peanut Oil] Shortness Of Breath and Swelling   Shellfish Allergy Shortness Of Breath and Swelling   Soap Shortness Of Breath and Swelling    LAUNDRY DETERGENT   Latex Hives   Adenosine    Eggs Or Egg-Derived Products Rash    Physical Exam: Constitutional: in no apparent distress  Vitals:   10/04/20 1101 10/04/20 1127  BP:  (!) 161/95  Pulse: (!) 115 (!) 117  Resp: (!) 29 (!) 30  Temp:  99.2 F (37.3 C)  SpO2: 100% 99%   EYES: anicteric ENMT:+ trach Cardiovascular: Cor RRR Respiratory: some rhonchi; no respiratory distress  GI: soft Skin: negatives: no rash Neuro: non-verbal, no response  Lab Results  Component Value Date   WBC 10.9 (H) 10/04/2020   HGB 9.4 (L) 10/04/2020   HCT 30.3 (L) 10/04/2020   MCV 87.1 10/04/2020   PLT 262 10/04/2020    Lab Results  Component Value Date   CREATININE 0.49 10/04/2020   BUN 14 10/04/2020   NA 141 10/04/2020   K 4.3 10/04/2020   CL 110 10/04/2020   CO2 25 10/04/2020    Lab Results  Component Value Date   ALT 26 10/04/2020   AST 17 10/04/2020   ALKPHOS 79 10/04/2020     Microbiology: Recent Results (from the past 240 hour(s))  Resp Panel by RT-PCR (Flu A&B, Covid) Nasopharyngeal Swab     Status:  None   Collection Time: 10/26/20 10:04 AM   Specimen: Nasopharyngeal Swab; Nasopharyngeal(NP) swabs in vial transport medium  Result Value Ref Range Status   SARS Coronavirus 2 by RT PCR NEGATIVE NEGATIVE Final    Comment: (NOTE) SARS-CoV-2 target nucleic acids are NOT DETECTED.  The SARS-CoV-2 RNA is generally detectable in upper respiratory specimens during the acute phase of infection. The lowest concentration of SARS-CoV-2 viral copies this assay can detect is 138 copies/mL. A negative result does not preclude SARS-Cov-2 infection and should  not be used as the sole basis for treatment or other patient management decisions. A negative result may occur with  improper specimen collection/handling, submission of specimen other than nasopharyngeal swab, presence of viral mutation(s) within the areas targeted by this assay, and inadequate number of viral copies(<138 copies/mL). A negative result must be combined with clinical observations, patient history, and epidemiological information. The expected result is Negative.  Fact Sheet for Patients:  BloggerCourse.comhttps://www.fda.gov/media/152166/download  Fact Sheet for Healthcare Providers:  SeriousBroker.ithttps://www.fda.gov/media/152162/download  This test is no t yet approved or cleared by the [place] FDA and  has been authorized for detection and/or diagnosis of SARS-CoV-2 by FDA under an Emergency Use Authorization (EUA). This EUA will remain  in effect (meaning this test can be used) for the duration of the COVID-19 declaration under Section 564(b)(1) of the Act, 21 U.S.C.section 360bbb-3(b)(1), unless the authorization is terminated  or revoked sooner.       Influenza A by PCR NEGATIVE NEGATIVE Final   Influenza B by PCR NEGATIVE NEGATIVE Final    Comment: (NOTE) The Xpert Xpress SARS-CoV-2/FLU/RSV plus assay is intended as an aid in the diagnosis of influenza from Nasopharyngeal swab specimens and should not be used as a sole basis for  treatment. Nasal washings and aspirates are unacceptable for Xpert Xpress SARS-CoV-2/FLU/RSV testing.  Fact Sheet for Patients: BloggerCourse.comhttps://www.fda.gov/media/152166/download  Fact Sheet for Healthcare Providers: SeriousBroker.ithttps://www.fda.gov/media/152162/download  This test is not yet approved or cleared by the [place] FDA and has been authorized for detection and/or diagnosis of SARS-CoV-2 by FDA under an Emergency Use Authorization (EUA). This EUA will remain in effect (meaning this test can be used) for the duration of the COVID-19 declaration under Section 564(b)(1) of the Act, 21 U.S.C. section 360bbb-3(b)(1), unless the authorization is terminated or revoked.  Performed at [facility], [address] [place], [place] [address]   Blood Culture (routine x 2)     Status: Abnormal (Preliminary result)   Collection Time: Nov 23, 2020 10:04 AM   Specimen: BLOOD  Result Value Ref Range Status   Specimen Description BLOOD RIGHT ANTECUBITAL  Final   Special Requests   Final    BOTTLES DRAWN AEROBIC AND ANAEROBIC Blood Culture results may not be optimal due to an inadequate volume of blood received in culture bottles   Culture  Setup Time   Final    GRAM POSITIVE COCCI IN CLUSTERS IN BOTH AEROBIC AND ANAEROBIC BOTTLES CRITICAL RESULT CALLED TO, READ BACK BY AND VERIFIED WITH: PHARM D J.FRIENS ON [id number] AT 0815 BY E.PARRISH    Culture (A)  Final    STAPHYLOCOCCUS CAPITIS STAPHYLOCOCCUS HOMINIS THE SIGNIFICANCE OF ISOLATING THIS ORGANISM FROM A SINGLE SET OF BLOOD CULTURES WHEN MULTIPLE SETS ARE DRAWN IS UNCERTAIN. PLEASE NOTIFY THE MICROBIOLOGY DEPARTMENT WITHIN ONE WEEK IF SPECIATION AND SENSITIVITIES ARE REQUIRED. SUSCEPTIBILITI TO FOLLOW FOR STAPH CAPITIS Performed at [facility], [address] [place], [place] [address]    Report Status PENDING  Incomplete  Urine culture     Status: Abnormal   Collection Time: Nov 23, 2020 10:04 AM   Specimen: In/Out Cath Urine  Result  Value Ref Range Status   Specimen Description IN/OUT CATH URINE  Final   Special Requests   Final    NONE Performed at [facility], [address] [place], [place] [address]    Culture MULTIPLE SPECIES PRESENT, SUGGEST RECOLLECTION (A)  Final   Report Status 10/02/2020 FINAL  Final  Blood Culture ID Panel (Reflexed)  Status: Abnormal   Collection Time: 09/30/2020 10:04 AM  Result Value Ref Range Status   Enterococcus faecalis NOT DETECTED NOT DETECTED Final   Enterococcus Faecium NOT DETECTED NOT DETECTED Final   Listeria monocytogenes NOT DETECTED NOT DETECTED Final   Staphylococcus species DETECTED (A) NOT DETECTED Final    Comment: CRITICAL RESULT CALLED TO, READ BACK BY AND VERIFIED WITH: PHARM D J.FRIENS ON [id number] AT 0815 BY E.PARRISH    Staphylococcus aureus (BCID) NOT DETECTED NOT DETECTED Final   Staphylococcus epidermidis NOT DETECTED NOT DETECTED Final   Staphylococcus lugdunensis NOT DETECTED NOT DETECTED Final   Streptococcus species NOT DETECTED NOT DETECTED Final   Streptococcus agalactiae NOT DETECTED NOT DETECTED Final   Streptococcus pneumoniae NOT DETECTED NOT DETECTED Final   Streptococcus pyogenes NOT DETECTED NOT DETECTED Final   A.calcoaceticus-baumannii NOT DETECTED NOT DETECTED Final   Bacteroides fragilis NOT DETECTED NOT DETECTED Final   Enterobacterales NOT DETECTED NOT DETECTED Final   Enterobacter cloacae complex NOT DETECTED NOT DETECTED Final   Escherichia coli NOT DETECTED NOT DETECTED Final   Klebsiella aerogenes NOT DETECTED NOT DETECTED Final   Klebsiella oxytoca NOT DETECTED NOT DETECTED Final   Klebsiella pneumoniae NOT DETECTED NOT DETECTED Final   Proteus species NOT DETECTED NOT DETECTED Final   Salmonella species NOT DETECTED NOT DETECTED Final   Serratia marcescens NOT DETECTED NOT DETECTED Final   Haemophilus influenzae NOT DETECTED NOT DETECTED Final   Neisseria meningitidis NOT DETECTED NOT DETECTED Final   Pseudomonas  aeruginosa NOT DETECTED NOT DETECTED Final   Stenotrophomonas maltophilia NOT DETECTED NOT DETECTED Final   Candida albicans NOT DETECTED NOT DETECTED Final   Candida auris NOT DETECTED NOT DETECTED Final   Candida glabrata NOT DETECTED NOT DETECTED Final   Candida krusei NOT DETECTED NOT DETECTED Final   Candida parapsilosis NOT DETECTED NOT DETECTED Final   Candida tropicalis NOT DETECTED NOT DETECTED Final   Cryptococcus neoformans/gattii NOT DETECTED NOT DETECTED Final    Comment: Performed at [facility], [address] [place], [place] [address]  Blood Culture (routine x 2)     Status: Abnormal (Preliminary result)   Collection Time: 10/10/2020 10:09 AM   Specimen: BLOOD  Result Value Ref Range Status   Specimen Description BLOOD LEFT ANTECUBITAL  Final   Special Requests   Final    BOTTLES DRAWN AEROBIC AND ANAEROBIC Blood Culture adequate volume   Culture  Setup Time   Final    GRAM POSITIVE COCCI IN CLUSTERS IN BOTH AEROBIC AND ANAEROBIC BOTTLES CRITICAL VALUE NOTED.  VALUE IS CONSISTENT WITH PREVIOUSLY REPORTED AND CALLED VALUE. Performed at [facility], [address] [place], [place] [address]    Culture (A)  Final    STAPHYLOCOCCUS EPIDERMIDIS STAPHYLOCOCCUS CAPITIS    Report Status PENDING  Incomplete  MRSA Next Gen by PCR, Nasal     Status: Abnormal   Collection Time: 10/02/20  7:31 AM  Result Value Ref Range Status   MRSA by PCR Next Gen DETECTED (A) NOT DETECTED Final    Comment: RESULT CALLED TO, READ BACK BY AND VERIFIED WITH: RN A.PURNAGE ON [id number] AT 1113 BY E.PARRISH (NOTE) The GeneXpert MRSA Assay (FDA approved for NASAL specimens only), is one component of a comprehensive MRSA colonization surveillance program. It is not intended to diagnose MRSA infection nor to guide or monitor treatment for MRSA infections. Test performance is not FDA approved in patients less than 67 years old. Performed at [facility], [address]  [place], [place] [address]   Culture, Respiratory w Gram Stain     Status: None (Preliminary result)   Collection Time: 10/02/20  3:33 PM   Specimen: Tracheal Aspirate; Respiratory  Result Value Ref Range Status   Specimen Description TRACHEAL ASPIRATE  Final   Special Requests NONE  Final   Gram Stain   Final    RARE WBC PRESENT,BOTH PMN AND MONONUCLEAR RARE GRAM NEGATIVE RODS    Culture   Final    RARE PSEUDOMONAS AERUGINOSA FEW KLEBSIELLA PNEUMONIAE SUSCEPTIBILITIES TO FOLLOW Performed at [facility], [address], [place], [place] [address]    Report Status PENDING  Incomplete  Culture, blood (routine x 2)     Status: None (Preliminary result)   Collection Time: 10/03/20  6:48 AM   Specimen: BLOOD RIGHT HAND  Result Value Ref Range Status   Specimen Description BLOOD RIGHT HAND  Final   Special Requests   Final    BOTTLES DRAWN AEROBIC AND ANAEROBIC Blood Culture results may not be optimal due to an inadequate volume of blood received in culture bottles   Culture   Final    NO GROWTH < 24 HOURS Performed at [facility], [address] [place], [place] [address]    Report Status PENDING  Incomplete  Culture, blood (routine x 2)     Status: None (Preliminary result)   Collection Time: 10/03/20  6:53 AM   Specimen: BLOOD RIGHT HAND  Result Value Ref Range Status   Specimen Description BLOOD RIGHT HAND  Final   Special Requests   Final    BOTTLES DRAWN AEROBIC AND ANAEROBIC Blood Culture adequate volume   Culture   Final    NO GROWTH < 24 HOURS Performed at [facility], [address], [place], [place] [address]    Report Status PENDING  Incomplete    Gardiner Barefoot, MD [facility] [email or web address] 10/04/2020, 11:53 AM

## 2020-10-04 NOTE — Progress Notes (Addendum)
PROGRESS NOTE                                                                                                                                                                                                             Patient Demographics:    Sonia Drake, is a 65 y.o. female, DOB - [birth year], GBE:[id number]  Outpatient Primary MD for the patient is Eloisa Northern, MD    LOS - 3  Admit date - 09/20/2020    Chief Complaint  Patient presents with   Respiratory Distress       Brief Narrative (HPI from H&P)  Sonia Drake is a 65 y.o. female with medical history significant of asthma/COPD, CHF, cardiac arrest with PEA/CPR with chronic respiratory failure on permanent trach with supplemental oxygen at 6L,  type 2 DM, GERD, seizures and severe hypoxic-ischemic encephalopathy who presented to ER via EMS for respiratory distress from SNF.   Subjective:    Sonia Drake today main symptom bed trached and pegged, baseline anoxic brain injury and unresponsive, unable to answer questions or follow commands.   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  Now suspicious for sepsis due to aspiration pneumonia causing gram-positive bacteremia 2 out of 2 with staph epidermis and staph hominis, currently on broad-spectrum antibiotics and sepsis pathophysiology has resolved, PCCM is following, steroids per them ? Asthma exacerbation.  Echo stable and repeat blood cultures drawn on 10/02/2020 are so far negative.  Patient does have tracheostomy hence there is a potential source for skin flora to infect her versus contamination of the blood culture.  ID will be consulted to further stream down antibiotics.   Long-term prognosis is extremely poor due to underlying anoxic brain injury and poor functional status, will continue supportive care and monitor.  Family wants aggressive  measures for now.  Note at baseline patient wears a trach collar with 6 L per minute, currently on 10 L. VQ scan ordered upon admission - pending.  Clinical suspicion of PE is very low.  Echo noted with no right-sided strain preserved EF of 60%.     SpO2: 98 % O2 Flow Rate (L/min): 10 L/min FiO2 (%): 60 %  Recent Labs  Lab 10/09/2020 1004 10/18/2020 1126 10/12/2020 1127 09/23/2020 1248  Oct 12, 2020 1532 10/12/20 1618 10-12-20 1831 10/02/20 0758 10/03/20 0654 10/04/20 0051 10/04/20 0734  WBC  --   --   --  11.0*  --   --  16.1* 10.7* 9.5 10.9*  --   HGB  --   --   --  11.7*  --   --  11.9* 10.4* 9.2* 9.4*  --   HCT  --   --   --  38.5  --   --  39.6 32.8* 30.9* 30.3*  --   PLT  --   --   --  328  --   --  302 281 279 262  --   CRP  --   --   --   --   --   --   --  6.9* 2.8* 0.8  --   BNP  --   --   --   --   --  21.5  --  63.9 104.1* 145.8*  --   DDIMER  --   --  1.47*  --   --   --   --   --   --   --  0.88*  PROCALCITON  --   --   --   --   --   --   --  <0.10 <0.10 <0.10  --   AST  --   --  50*  --   --   --   --  --   ALT  --   --  28  --   --   --   --  --   ALKPHOS  --   --  112  --   --   --   --  94 88 79  --   BILITOT  --   --  0.8  --   --   --   --  0.3 0.4 0.2*  --   ALBUMIN  --   --  3.0*  --   --   --   --  2.6* 2.6* 2.5*  --   INR  --   --  1.0  --   --   --   --   --   --   --   --   LATICACIDVEN  --  3.6*  --   --  2.1*  --   --   --   --   --   --   SARSCOV2NAA NEGATIVE  --   --   --   --   --   --   --   --   --   --      2.  Anoxic brain injury due to PEA arrest in the past (August 2021 - asthma attack induced) .  Trached and pegged at baseline.  Minimal to no response to verbal stimuli only responds to painful stimuli.  Poor functional status with history of seizures.  Continue supportive care with Keppra.  Poor prognosis.  Had a detailed discussion with patient's daughter she is willing to listen to what neurology has to say about her long-term  outlook.  Patient seen by neurology and they agree that patient has extremely poor long-term outlook, nearly no chance of meaningful recovery at this time, conveyed to the daughter unfortunately at this time she would want to continue aggressive measures, she clearly has poor understanding of patient's medical condition and seems to have somewhat unrealistic expectations.  3.  Chronic diastolic CHF EF 60% on echocardiogram in December 2021.  Few crackles on 10/04/2020, stop further IV fluids and give dose of IV Lasix.  4.  IV PPI.  5.  History of seizures with anoxic brain injury.  On Keppra.  6.  COPD/ Asthma - stable, supportive Rx. Steroids per PCCM.  7. DM type II.  On sliding scale and Lantus.  Feeding via PEG tube  Lab Results  Component Value Date   HGBA1C 7.0 (H) 10/12/2020   CBG (last 3)  Recent Labs    10/03/20 2333 10/04/20 0432 10/04/20 0730  GLUCAP 187* 275* 268*     Nutrition Problem: Nutrition Problem: Increased nutrient needs Etiology: wound healing Signs/Symptoms: estimated needs Interventions: Tube feeding  Obesity: Estimated body mass index is 37.38 kg/m as calculated from the following:   Height as of this encounter: 5\' 2"  (1.575 m).   Weight as of this encounter: 92.7 kg.          Condition - Extremely Guarded  Family Communication  : Daughter Sharla Kidneyshanti [phone] on 10/02/20, 10/04/20  Code Status :  Full  Consults  :  PCCM, Neuro, ID  PUD Prophylaxis : PPI   Procedures  :      VQ  TTE - 1. Left ventricular ejection fraction, by estimation, is 70 to 75%. The left ventricle has hyperdynamic function. The left ventricle has no regional wall motion abnormalities. There is moderate left ventricular hypertrophy. Left ventricular diastolic parameters are consistent with Grade I diastolic dysfunction (impaired relaxation).  2. Right ventricular systolic function is normal. The right ventricular size is normal.  3. The mitral valve is normal in  structure. No evidence of mitral valve regurgitation. No evidence of mitral stenosis.  4. The aortic valve has an indeterminant number of cusps. Aortic valve regurgitation is not visualized. No aortic stenosis is present.  5. The inferior vena cava is normal in size with greater than 50% respiratory variability, suggesting right atrial pressure of 3 mmHg.      Disposition Plan  :    Status is: Inpatient  Remains inpatient appropriate because:IV treatments appropriate due to intensity of illness or inability to take PO  Dispo: The patient is from: SNF              Anticipated d/c is to: SNF              Patient currently is not medically stable to d/c.   Difficult to place patient No  DVT Prophylaxis  :  Lovenox     Lab Results  Component Value Date   PLT 262 10/04/2020    Diet :  Diet Order             Diet NPO time specified  Diet effective now                    Inpatient Medications  Scheduled Meds:  budesonide  0.5 mg Nebulization BID   chlorhexidine  15 mL Mouth/Throat BID   clonazePAM  0.5 mg Per Tube BID   clopidogrel  75 mg Per Tube Daily   enoxaparin (LOVENOX) injection  0.5 mg/kg Subcutaneous Q24H   famotidine  20 mg Per Tube BID   feeding supplement (PROSource TF)  45 mL Per Tube BID   insulin aspart  0-15 Units Subcutaneous TID WC   insulin glargine  40 Units Subcutaneous QHS   ipratropium-albuterol  3 mL Nebulization Q4H   levETIRAcetam  2,500 mg  Per Tube BID   loratadine  10 mg Per Tube Daily   methylPREDNISolone (SOLU-MEDROL) injection  60 mg Intravenous Q6H   montelukast  10 mg Per Tube Daily   pantoprazole (PROTONIX) IV  40 mg Intravenous Q24H   potassium chloride  20 mEq Per Tube Daily   senna-docusate  1 tablet Per Tube q AM   Continuous Infusions:  ceFEPime (MAXIPIME) IV 2 g (10/04/20 0527)   feeding supplement (JEVITY 1.5 CAL/FIBER) 1,000 mL (10/02/20 1604)   metronidazole 500 mg (10/04/20 0526)   vancomycin 1,250 mg (10/03/20 1328)    PRN Meds:.albuterol  Antibiotics  :    Anti-infectives (From admission, onward)    Start     Dose/Rate Route Frequency Ordered Stop   10/02/20 1400  vancomycin (VANCOREADY) IVPB 1250 mg/250 mL        1,250 mg 166.7 mL/hr over 90 Minutes Intravenous Every 24 hours 10/05/2020 1313     10/12/2020 2200  ceFEPIme (MAXIPIME) 2 g in sodium chloride 0.9 % 100 mL IVPB        2 g 200 mL/hr over 30 Minutes Intravenous Every 8 hours 09/29/2020 1313     10/18/2020 2200  metroNIDAZOLE (FLAGYL) IVPB 500 mg        500 mg 100 mL/hr over 60 Minutes Intravenous Every 8 hours 09/26/2020 1616     10/08/2020 1330  vancomycin (VANCOREADY) IVPB 2000 mg/400 mL        2,000 mg 200 mL/hr over 120 Minutes Intravenous  Once 09/28/2020 1257 09/30/2020 1550   10/04/2020 1300  ceFEPIme (MAXIPIME) 2 g in sodium chloride 0.9 % 100 mL IVPB        2 g 200 mL/hr over 30 Minutes Intravenous  Once 10/04/2020 1257 09/21/2020 1330   09/20/2020 1300  metroNIDAZOLE (FLAGYL) IVPB 500 mg        500 mg 100 mL/hr over 60 Minutes Intravenous  Once 10/09/2020 1257 09/30/2020 1443        Time Spent in minutes  30   Susa Raring M.D on 10/04/2020 at 10:59 AM  To page go to [email or web address]   [facility] -  Office  [phone]     See all Orders from today for further details    Objective:   Vitals:   10/04/20 0759 10/04/20 0803 10/04/20 0812 10/04/20 0921  BP:   135/70 (!) 141/68  Pulse: 84 86 89 93  Resp: (!) 28 (!) 30 (!) 26 (!) 24  Temp:   98.8 F (37.1 C) 98.7 F (37.1 C)  TempSrc:   Axillary Axillary  SpO2: 100% 99% 98% 98%  Weight:      Height:        Wt Readings from Last 3 Encounters:  10/04/20 92.7 kg  09/08/19 96.6 kg  08/30/19 99.7 kg     Intake/Output Summary (Last 24 hours) at 10/04/2020 1059 Last data filed at 10/03/2020 1826 Gross per 24 hour  Intake --  Output 400 ml  Net -400 ml     Physical Exam  Somnolent , responsive to painful stimuli only, Trach and PEG in place, some infrequent myoclonic  jerks [place].AT,PERRAL Supple Neck,No JVD, No cervical lymphadenopathy appriciated.  Symmetrical Chest wall movement, Good air movement bilaterally but coarse breath sounds bilaterally, RRR,No Gallops, Rubs or new Murmurs, No Parasternal Heave +ve B.Sounds, Abd Soft, No tenderness, No organomegaly appriciated, No rebound - guarding or rigidity. No Cyanosis, Clubbing or edema, No new Rash or bruise    RN pressure injury documentation:  Pressure Injury 10/02/20 Buttocks Bilateral Stage 2 -  Partial thickness loss of dermis presenting as a shallow open injury with a red, pink wound bed without slough. (Active)  10/02/20 0300  Location: Buttocks  Location Orientation: Bilateral  Staging: Stage 2 -  Partial thickness loss of dermis presenting as a shallow open injury with a red, pink wound bed without slough.  Wound Description (Comments):   Present on Admission: Yes     Data Review:    CBC Recent Labs  Lab 10-19-20 1248 Oct 19, 2020 1831 10/02/20 0758 10/03/20 0654 10/04/20 0051  WBC 11.0* 16.1* 10.7* 9.5 10.9*  HGB 11.7* 11.9* 10.4* 9.2* 9.4*  HCT 38.5 39.6 32.8* 30.9* 30.3*  PLT 328 302 281 279 262  MCV 87.1 87.4 85.0 88.0 87.1  MCH 26.5 26.3 26.9 26.2 27.0  MCHC 30.4 30.1 31.7 29.8* 31.0  RDW 15.1 15.0 15.0 15.2 15.3  LYMPHSABS 1.7  --   --  0.9 0.9  MONOABS 1.0  --   --  0.4 0.4  EOSABS 0.2  --   --  0.0 0.0  BASOSABS 0.1  --   --  0.0 0.0    Recent Labs  Lab 2020-10-19 1126 10-19-20 1127 2020/10/19 1532 10-19-2020 1618 October 19, 2020 1831 10/02/20 0758 10/03/20 0654 10/04/20 0051 10/04/20 0734  NA  --  137  --   --   --  139 138 141  --   K  --  5.2*  --   --   --  3.5 4.2 4.3  --   CL  --  100  --   --   --  104 108 110  --   CO2  --  27  --   --   --  28 23 25   --   GLUCOSE  --  202*  --   --   --  91 241* 235*  --   BUN  --  16  --   --   --  9 14 14   --   CREATININE  --  0.57  --   --  0.43* 0.45 0.43* 0.49  --   CALCIUM  --  10.0  --   --   --  10.1 10.1 10.2  --    AST  --  50*  --   --   --  18 20 17   --   ALT  --  28  --   --   --  21 25 26   --   ALKPHOS  --  112  --   --   --  94 88 79  --   BILITOT  --  0.8  --   --   --  0.3 0.4 0.2*  --   ALBUMIN  --  3.0*  --   --   --  2.6* 2.6* 2.5*  --   MG  --   --   --   --   --  1.7 2.0 2.0  --   CRP  --   --   --   --   --  6.9* 2.8* 0.8  --   DDIMER  --  1.47*  --   --   --   --   --   --  0.88*  PROCALCITON  --   --   --   --   --  <0.10 <0.10 <0.10  --   LATICACIDVEN 3.6*  --  2.1*  --   --   --   --   --   --  INR  --  1.0  --   --   --   --   --   --   --   HGBA1C  --   --   --   --  7.0*  --   --   --   --   BNP  --   --   --  21.5  --  63.9 104.1* 145.8*  --     ------------------------------------------------------------------------------------------------------------------ No results for input(s): CHOL, HDL, LDLCALC, TRIG, CHOLHDL, LDLDIRECT in the last 72 hours.  Lab Results  Component Value Date   HGBA1C 7.0 (H) 2020-10-23   ------------------------------------------------------------------------------------------------------------------ No results for input(s): TSH, T4TOTAL, T3FREE, THYROIDAB in the last 72 hours.  Invalid input(s): FREET3  Cardiac Enzymes No results for input(s): CKMB, TROPONINI, MYOGLOBIN in the last 168 hours.  Invalid input(s): CK ------------------------------------------------------------------------------------------------------------------    Component Value Date/Time   BNP 145.8 (H) 10/04/2020 0051    Micro Results Recent Results (from the past 240 hour(s))  Resp Panel by RT-PCR (Flu A&B, Covid) Nasopharyngeal Swab     Status: None   Collection Time: 10/23/2020 10:04 AM   Specimen: Nasopharyngeal Swab; Nasopharyngeal(NP) swabs in vial transport medium  Result Value Ref Range Status   SARS Coronavirus 2 by RT PCR NEGATIVE NEGATIVE Final    Comment: (NOTE) SARS-CoV-2 target nucleic acids are NOT DETECTED.  The SARS-CoV-2 RNA is generally  detectable in upper respiratory specimens during the acute phase of infection. The lowest concentration of SARS-CoV-2 viral copies this assay can detect is 138 copies/mL. A negative result does not preclude SARS-Cov-2 infection and should not be used as the sole basis for treatment or other patient management decisions. A negative result may occur with  improper specimen collection/handling, submission of specimen other than nasopharyngeal swab, presence of viral mutation(s) within the areas targeted by this assay, and inadequate number of viral copies(<138 copies/mL). A negative result must be combined with clinical observations, patient history, and epidemiological information. The expected result is Negative.  Fact Sheet for Patients:  BloggerCourse.com  Fact Sheet for Healthcare Providers:  SeriousBroker.it  This test is no t yet approved or cleared by the [place] FDA and  has been authorized for detection and/or diagnosis of SARS-CoV-2 by FDA under an Emergency Use Authorization (EUA). This EUA will remain  in effect (meaning this test can be used) for the duration of the COVID-19 declaration under Section 564(b)(1) of the Act, 21 U.S.C.section 360bbb-3(b)(1), unless the authorization is terminated  or revoked sooner.       Influenza A by PCR NEGATIVE NEGATIVE Final   Influenza B by PCR NEGATIVE NEGATIVE Final    Comment: (NOTE) The Xpert Xpress SARS-CoV-2/FLU/RSV plus assay is intended as an aid in the diagnosis of influenza from Nasopharyngeal swab specimens and should not be used as a sole basis for treatment. Nasal washings and aspirates are unacceptable for Xpert Xpress SARS-CoV-2/FLU/RSV testing.  Fact Sheet for Patients: BloggerCourse.com  Fact Sheet for Healthcare Providers: SeriousBroker.it  This test is not yet approved or cleared by the [place] FDA  and has been authorized for detection and/or diagnosis of SARS-CoV-2 by FDA under an Emergency Use Authorization (EUA). This EUA will remain in effect (meaning this test can be used) for the duration of the COVID-19 declaration under Section 564(b)(1) of the Act, 21 U.S.C. section 360bbb-3(b)(1), unless the authorization is terminated or revoked.  Performed at [facility], [address] [place], [place] [address]   Blood Culture (  routine x 2)     Status: Abnormal (Preliminary result)   Collection Time: October 13, 2020 10:04 AM   Specimen: BLOOD  Result Value Ref Range Status   Specimen Description BLOOD RIGHT ANTECUBITAL  Final   Special Requests   Final    BOTTLES DRAWN AEROBIC AND ANAEROBIC Blood Culture results may not be optimal due to an inadequate volume of blood received in culture bottles   Culture  Setup Time   Final    GRAM POSITIVE COCCI IN CLUSTERS IN BOTH AEROBIC AND ANAEROBIC BOTTLES CRITICAL RESULT CALLED TO, READ BACK BY AND VERIFIED WITH: PHARM D J.FRIENS ON [id number] AT 0815 BY E.PARRISH    Culture (A)  Final    STAPHYLOCOCCUS CAPITIS STAPHYLOCOCCUS HOMINIS THE SIGNIFICANCE OF ISOLATING THIS ORGANISM FROM A SINGLE SET OF BLOOD CULTURES WHEN MULTIPLE SETS ARE DRAWN IS UNCERTAIN. PLEASE NOTIFY THE MICROBIOLOGY DEPARTMENT WITHIN ONE WEEK IF SPECIATION AND SENSITIVITIES ARE REQUIRED. SUSCEPTIBILITI TO FOLLOW FOR STAPH CAPITIS Performed at [facility], [address], [place], [place] [address]    Report Status PENDING  Incomplete  Urine culture     Status: Abnormal   Collection Time: 10-13-20 10:04 AM   Specimen: In/Out Cath Urine  Result Value Ref Range Status   Specimen Description IN/OUT CATH URINE  Final   Special Requests   Final    NONE Performed at [facility], [address], [place], [place] [address]    Culture MULTIPLE SPECIES PRESENT, SUGGEST RECOLLECTION (A)  Final   Report Status 10/02/2020 FINAL  Final  Blood Culture ID Panel  (Reflexed)     Status: Abnormal   Collection Time: Oct 13, 2020 10:04 AM  Result Value Ref Range Status   Enterococcus faecalis NOT DETECTED NOT DETECTED Final   Enterococcus Faecium NOT DETECTED NOT DETECTED Final   Listeria monocytogenes NOT DETECTED NOT DETECTED Final   Staphylococcus species DETECTED (A) NOT DETECTED Final    Comment: CRITICAL RESULT CALLED TO, READ BACK BY AND VERIFIED WITH: PHARM D J.FRIENS ON [id number] AT 0815 BY E.PARRISH    Staphylococcus aureus (BCID) NOT DETECTED NOT DETECTED Final   Staphylococcus epidermidis NOT DETECTED NOT DETECTED Final   Staphylococcus lugdunensis NOT DETECTED NOT DETECTED Final   Streptococcus species NOT DETECTED NOT DETECTED Final   Streptococcus agalactiae NOT DETECTED NOT DETECTED Final   Streptococcus pneumoniae NOT DETECTED NOT DETECTED Final   Streptococcus pyogenes NOT DETECTED NOT DETECTED Final   A.calcoaceticus-baumannii NOT DETECTED NOT DETECTED Final   Bacteroides fragilis NOT DETECTED NOT DETECTED Final   Enterobacterales NOT DETECTED NOT DETECTED Final   Enterobacter cloacae complex NOT DETECTED NOT DETECTED Final   Escherichia coli NOT DETECTED NOT DETECTED Final   Klebsiella aerogenes NOT DETECTED NOT DETECTED Final   Klebsiella oxytoca NOT DETECTED NOT DETECTED Final   Klebsiella pneumoniae NOT DETECTED NOT DETECTED Final   Proteus species NOT DETECTED NOT DETECTED Final   Salmonella species NOT DETECTED NOT DETECTED Final   Serratia marcescens NOT DETECTED NOT DETECTED Final   Haemophilus influenzae NOT DETECTED NOT DETECTED Final   Neisseria meningitidis NOT DETECTED NOT DETECTED Final   Pseudomonas aeruginosa NOT DETECTED NOT DETECTED Final   Stenotrophomonas maltophilia NOT DETECTED NOT DETECTED Final   Candida albicans NOT DETECTED NOT DETECTED Final   Candida auris NOT DETECTED NOT DETECTED Final   Candida glabrata NOT DETECTED NOT DETECTED Final   Candida krusei NOT DETECTED NOT DETECTED Final   Candida  parapsilosis NOT DETECTED NOT DETECTED Final  Candida tropicalis NOT DETECTED NOT DETECTED Final   Cryptococcus neoformans/gattii NOT DETECTED NOT DETECTED Final    Comment: Performed at [facility], [address], [place], [place] [address]  Blood Culture (routine x 2)     Status: Abnormal (Preliminary result)   Collection Time: 10-07-2020 10:09 AM   Specimen: BLOOD  Result Value Ref Range Status   Specimen Description BLOOD LEFT ANTECUBITAL  Final   Special Requests   Final    BOTTLES DRAWN AEROBIC AND ANAEROBIC Blood Culture adequate volume   Culture  Setup Time   Final    GRAM POSITIVE COCCI IN CLUSTERS IN BOTH AEROBIC AND ANAEROBIC BOTTLES CRITICAL VALUE NOTED.  VALUE IS CONSISTENT WITH PREVIOUSLY REPORTED AND CALLED VALUE. Performed at [facility], [address] [place], [place] [address]    Culture (A)  Final    STAPHYLOCOCCUS EPIDERMIDIS STAPHYLOCOCCUS CAPITIS    Report Status PENDING  Incomplete  MRSA Next Gen by PCR, Nasal     Status: Abnormal   Collection Time: 10/02/20  7:31 AM  Result Value Ref Range Status   MRSA by PCR Next Gen DETECTED (A) NOT DETECTED Final    Comment: RESULT CALLED TO, READ BACK BY AND VERIFIED WITH: RN A.PURNAGE ON [id number] AT 1113 BY E.PARRISH (NOTE) The GeneXpert MRSA Assay (FDA approved for NASAL specimens only), is one component of a comprehensive MRSA colonization surveillance program. It is not intended to diagnose MRSA infection nor to guide or monitor treatment for MRSA infections. Test performance is not FDA approved in patients less than 36 years old. Performed at [facility], [address] [place], [place] [address]   Culture, Respiratory w Gram Stain     Status: None (Preliminary result)   Collection Time: 10/02/20  3:33 PM   Specimen: Tracheal Aspirate; Respiratory  Result Value Ref Range Status   Specimen Description TRACHEAL ASPIRATE  Final   Special Requests NONE  Final   Gram Stain   Final     RARE WBC PRESENT,BOTH PMN AND MONONUCLEAR RARE GRAM NEGATIVE RODS    Culture   Final    CULTURE REINCUBATED FOR BETTER GROWTH Performed at [facility], [address] [place], [place] [address]    Report Status PENDING  Incomplete  Culture, blood (routine x 2)     Status: None (Preliminary result)   Collection Time: 10/03/20  6:48 AM   Specimen: BLOOD RIGHT HAND  Result Value Ref Range Status   Specimen Description BLOOD RIGHT HAND  Final   Special Requests   Final    BOTTLES DRAWN AEROBIC AND ANAEROBIC Blood Culture results may not be optimal due to an inadequate volume of blood received in culture bottles   Culture   Final    NO GROWTH < 24 HOURS Performed at [facility], [address], [place], [place] [address]    Report Status PENDING  Incomplete  Culture, blood (routine x 2)     Status: None (Preliminary result)   Collection Time: 10/03/20  6:53 AM   Specimen: BLOOD RIGHT HAND  Result Value Ref Range Status   Specimen Description BLOOD RIGHT HAND  Final   Special Requests   Final    BOTTLES DRAWN AEROBIC AND ANAEROBIC Blood Culture adequate volume   Culture   Final    NO GROWTH < 24 HOURS Performed at [facility], [address] [place], [place] [address]    Report Status PENDING  Incomplete    Radiology Reports DG CHEST PORT 1 VIEW  Result Date: 10/02/2020 CLINICAL DATA:  Respiratory distress EXAM: PORTABLE CHEST 1 VIEW COMPARISON:  Film from earlier in the same day FINDINGS: Tracheostomy tube is noted in satisfactory position. Cardiac shadow is unremarkable. The lungs are clear bilaterally. No bony abnormality is seen. IMPRESSION: Acute abnormality noted. Electronically Signed   By: Alcide Clever M.D.   On: 10/02/2020 16:05   DG Chest Port 1 View  Result Date: 10/02/2020 CLINICAL DATA:  Shortness of breath EXAM: PORTABLE CHEST 1 VIEW COMPARISON:  October 01, 2020 FINDINGS: Evaluation is limited secondary to patient rotation. The cardiomediastinal  silhouette is unchanged in contour.Tracheostomy. No pleural effusion. No pneumothorax. LEFT retrocardiac opacity, likely atelectasis. Visualized abdomen is unremarkable. Multilevel degenerative changes of the thoracic spine. IMPRESSION: No acute cardiopulmonary abnormality. Electronically Signed   By: Meda Klinefelter MD   On: 10/02/2020 07:50   DG Chest Port 1 View  Result Date: 09/30/2020 CLINICAL DATA:  Possible sepsis.  Vomiting.  Hypoxia. EXAM: PORTABLE CHEST 1 VIEW COMPARISON:  01/17/2020 FINDINGS: Tracheostomy tube in adequate position. Lungs are adequately inflated without focal airspace consolidation or effusion. Cardiomediastinal silhouette and remainder of the exam is unchanged. IMPRESSION: No acute cardiopulmonary disease. Electronically Signed   By: Elberta Fortis M.D.   On: 10/12/2020 10:45   DG Abd Portable 1V  Result Date: 10/02/2020 CLINICAL DATA:  Nausea. EXAM: PORTABLE ABDOMEN - 1 VIEW COMPARISON:  12/25/2017 FINDINGS: Normal bowel gas pattern. No abnormal stool retention. Percutaneous gastrostomy tube in place. No concerning mass effect or calcification. Left hip arthroplasty. Lumbar spine degeneration and scoliosis IMPRESSION: Normal bowel gas pattern. Electronically Signed   By: Marnee Spring M.D.   On: 10/02/2020 09:16   ECHOCARDIOGRAM COMPLETE  Result Date: 10/03/2020    ECHOCARDIOGRAM REPORT   Patient Name:   KYMIA SIMI Date of Exam: 10/03/2020 Medical Rec #:  [id number]      Height:       62.0 in Accession #:    [id number]     Weight:       202.2 lb Date of Birth:  June 06, 1955      BSA:          1.920 m Patient Age:    64 years       BP:           143/82 mmHg Patient Gender: F              HR:           86 bpm. Exam Location:  Inpatient Procedure: 2D Echo, Cardiac Doppler and Color Doppler Indications:    Bacteremia R78.81  History:        Patient has prior history of Echocardiogram examinations, most                 recent 04/04/2020. Risk Factors:Morbid obesity.  Tracheostomy.                 Feeding tube.  Sonographer:    Roosvelt Maser RDCS Referring Phys: [id number] LAURA P CLARK IMPRESSIONS  1. Left ventricular ejection fraction, by estimation, is 70 to 75%. The left ventricle has hyperdynamic function. The left ventricle has no regional wall motion abnormalities. There is moderate left ventricular hypertrophy. Left ventricular diastolic parameters are consistent with Grade I diastolic dysfunction (impaired relaxation).  2. Right ventricular systolic function is normal. The right ventricular size is normal.  3. The mitral valve is normal in structure.  No evidence of mitral valve regurgitation. No evidence of mitral stenosis.  4. The aortic valve has an indeterminant number of cusps. Aortic valve regurgitation is not visualized. No aortic stenosis is present.  5. The inferior vena cava is normal in size with greater than 50% respiratory variability, suggesting right atrial pressure of 3 mmHg. FINDINGS  Left Ventricle: Left ventricular ejection fraction, by estimation, is 70 to 75%. The left ventricle has hyperdynamic function. The left ventricle has no regional wall motion abnormalities. The left ventricular internal cavity size was normal in size. There is moderate left ventricular hypertrophy. Left ventricular diastolic parameters are consistent with Grade I diastolic dysfunction (impaired relaxation). Right Ventricle: The right ventricular size is normal.Right ventricular systolic function is normal. Left Atrium: Left atrial size was normal in size. Right Atrium: Right atrial size was normal in size. Pericardium: There is no evidence of pericardial effusion. Mitral Valve: The mitral valve is normal in structure. No evidence of mitral valve regurgitation. No evidence of mitral valve stenosis. Tricuspid Valve: The tricuspid valve is normal in structure. Tricuspid valve regurgitation is not demonstrated. No evidence of tricuspid stenosis. Aortic Valve: The aortic valve has an  indeterminant number of cusps. Aortic valve regurgitation is not visualized. No aortic stenosis is present. Aortic valve mean gradient measures 9.0 mmHg. Aortic valve peak gradient measures 15.5 mmHg. Aortic valve area, by VTI measures 1.64 cm. Pulmonic Valve: The pulmonic valve was normal in structure. Pulmonic valve regurgitation is not visualized. No evidence of pulmonic stenosis. Aorta: The aortic root is normal in size and structure. Venous: The inferior vena cava is normal in size with greater than 50% respiratory variability, suggesting right atrial pressure of 3 mmHg.   LEFT VENTRICLE PLAX 2D LVIDd:         3.30 cm  Diastology LVIDs:         2.40 cm  LV e' medial:    5.87 cm/s LV PW:         1.40 cm  LV E/e' medial:  13.4 LV IVS:        1.00 cm  LV e' lateral:   8.92 cm/s LVOT diam:     1.90 cm  LV E/e' lateral: 8.8 LV SV:         55 LV SV Index:   29 LVOT Area:     2.84 cm  RIGHT VENTRICLE          IVC RV Basal diam:  3.60 cm  IVC diam: 2.00 cm LEFT ATRIUM           Index       RIGHT ATRIUM           Index LA diam:      3.50 cm 1.82 cm/m  RA Area:     12.00 cm LA Vol (A4C): 56.9 ml 29.63 ml/m RA Volume:   28.00 ml  14.58 ml/m  AORTIC VALVE AV Area (Vmax):    1.42 cm AV Area (Vmean):   1.30 cm AV Area (VTI):     1.64 cm AV Vmax:           197.00 cm/s AV Vmean:          143.000 cm/s AV VTI:            0.338 m AV Peak Grad:      15.5 mmHg AV Mean Grad:      9.0 mmHg LVOT Vmax:         98.90 cm/s LVOT Vmean:  65.600 cm/s LVOT VTI:          0.195 m LVOT/AV VTI ratio: 0.58  AORTA Ao Root diam: 2.70 cm Ao Asc diam:  2.80 cm MITRAL VALVE MV Area (PHT): 4.49 cm     SHUNTS MV Decel Time: 169 msec     Systemic VTI:  0.20 m MV E velocity: 78.50 cm/s   Systemic Diam: 1.90 cm MV A velocity: 117.00 cm/s MV E/A ratio:  0.67 Olga Millers MD Electronically signed by Olga Millers MD Signature Date/Time: 10/03/2020/5:16:22 PM    Final

## 2020-10-05 ENCOUNTER — Inpatient Hospital Stay (HOSPITAL_COMMUNITY): Payer: Medicare Other

## 2020-10-05 DIAGNOSIS — Z794 Long term (current) use of insulin: Secondary | ICD-10-CM

## 2020-10-05 DIAGNOSIS — I5032 Chronic diastolic (congestive) heart failure: Secondary | ICD-10-CM | POA: Diagnosis not present

## 2020-10-05 DIAGNOSIS — A419 Sepsis, unspecified organism: Secondary | ICD-10-CM | POA: Diagnosis not present

## 2020-10-05 DIAGNOSIS — J9601 Acute respiratory failure with hypoxia: Secondary | ICD-10-CM | POA: Diagnosis not present

## 2020-10-05 DIAGNOSIS — J4551 Severe persistent asthma with (acute) exacerbation: Secondary | ICD-10-CM | POA: Diagnosis not present

## 2020-10-05 DIAGNOSIS — J9621 Acute and chronic respiratory failure with hypoxia: Secondary | ICD-10-CM | POA: Diagnosis not present

## 2020-10-05 DIAGNOSIS — L89300 Pressure ulcer of unspecified buttock, unstageable: Secondary | ICD-10-CM

## 2020-10-05 DIAGNOSIS — E119 Type 2 diabetes mellitus without complications: Secondary | ICD-10-CM

## 2020-10-05 DIAGNOSIS — Z930 Tracheostomy status: Secondary | ICD-10-CM | POA: Diagnosis not present

## 2020-10-05 LAB — COMPREHENSIVE METABOLIC PANEL
ALT: 28 U/L (ref 0–44)
AST: 21 U/L (ref 15–41)
Albumin: 2.7 g/dL — ABNORMAL LOW (ref 3.5–5.0)
Alkaline Phosphatase: 76 U/L (ref 38–126)
Anion gap: 6 (ref 5–15)
BUN: 15 mg/dL (ref 8–23)
CO2: 28 mmol/L (ref 22–32)
Calcium: 10.4 mg/dL — ABNORMAL HIGH (ref 8.9–10.3)
Chloride: 105 mmol/L (ref 98–111)
Creatinine, Ser: 0.48 mg/dL (ref 0.44–1.00)
GFR, Estimated: >60 mL/min (ref >60)
Glucose, Bld: 257 mg/dL — ABNORMAL HIGH (ref 70–99)
Potassium: 4.2 mmol/L (ref 3.5–5.1)
Sodium: 139 mmol/L (ref 135–145)
Total Bilirubin: 0.2 mg/dL — ABNORMAL LOW (ref 0.3–1.2)
Total Protein: 6.2 g/dL — ABNORMAL LOW (ref 6.5–8.1)

## 2020-10-05 LAB — GLUCOSE, CAPILLARY
Glucose-Capillary: 232 mg/dL — ABNORMAL HIGH (ref 70–99)
Glucose-Capillary: 258 mg/dL — ABNORMAL HIGH (ref 70–99)
Glucose-Capillary: 267 mg/dL — ABNORMAL HIGH (ref 70–99)
Glucose-Capillary: 269 mg/dL — ABNORMAL HIGH (ref 70–99)
Glucose-Capillary: 279 mg/dL — ABNORMAL HIGH (ref 70–99)
Glucose-Capillary: 286 mg/dL — ABNORMAL HIGH (ref 70–99)
Glucose-Capillary: 290 mg/dL — ABNORMAL HIGH (ref 70–99)

## 2020-10-05 LAB — CBC WITH DIFFERENTIAL/PLATELET
Abs Immature Granulocytes: 0.2 10*3/uL — ABNORMAL HIGH (ref 0.00–0.07)
Basophils Absolute: 0 10*3/uL (ref 0.0–0.1)
Basophils Relative: 0 %
Eosinophils Absolute: 0 10*3/uL (ref 0.0–0.5)
Eosinophils Relative: 0 %
HCT: 33.8 % — ABNORMAL LOW (ref 36.0–46.0)
Hemoglobin: 10.4 g/dL — ABNORMAL LOW (ref 12.0–15.0)
Immature Granulocytes: 2 %
Lymphocytes Relative: 11 %
Lymphs Abs: 1.2 10*3/uL (ref 0.7–4.0)
MCH: 26.4 pg (ref 26.0–34.0)
MCHC: 30.8 g/dL (ref 30.0–36.0)
MCV: 85.8 fL (ref 80.0–100.0)
Monocytes Absolute: 0.6 10*3/uL (ref 0.1–1.0)
Monocytes Relative: 5 %
Neutro Abs: 9.1 10*3/uL — ABNORMAL HIGH (ref 1.7–7.7)
Neutrophils Relative %: 82 %
Platelets: 308 10*3/uL (ref 150–400)
RBC: 3.94 MIL/uL (ref 3.87–5.11)
RDW: 15.2 % (ref 11.5–15.5)
WBC: 11 10*3/uL — ABNORMAL HIGH (ref 4.0–10.5)
nRBC: 0 % (ref 0.0–0.2)

## 2020-10-05 LAB — POCT I-STAT 7, (LYTES, BLD GAS, ICA,H+H)
Acid-Base Excess: 4 mmol/L — ABNORMAL HIGH (ref 0.0–2.0)
Bicarbonate: 29.8 mmol/L — ABNORMAL HIGH (ref 20.0–28.0)
Calcium, Ion: 1.54 mmol/L (ref 1.15–1.40)
HCT: 38 % (ref 36.0–46.0)
Hemoglobin: 12.9 g/dL (ref 12.0–15.0)
O2 Saturation: 100 %
Potassium: 4.1 mmol/L (ref 3.5–5.1)
Sodium: 141 mmol/L (ref 135–145)
TCO2: 31 mmol/L (ref 22–32)
pCO2 arterial: 47.6 mmHg (ref 32.0–48.0)
pH, Arterial: 7.405 (ref 7.350–7.450)
pO2, Arterial: 186 mmHg — ABNORMAL HIGH (ref 83.0–108.0)

## 2020-10-05 LAB — CULTURE, RESPIRATORY W GRAM STAIN

## 2020-10-05 LAB — CULTURE, BLOOD (ROUTINE X 2): Special Requests: ADEQUATE

## 2020-10-05 LAB — BRAIN NATRIURETIC PEPTIDE: B Natriuretic Peptide: 115.3 pg/mL — ABNORMAL HIGH (ref 0.0–100.0)

## 2020-10-05 LAB — MAGNESIUM: Magnesium: 2 mg/dL (ref 1.7–2.4)

## 2020-10-05 LAB — PROCALCITONIN: Procalcitonin: <0.1 ng/mL

## 2020-10-05 LAB — D-DIMER, QUANTITATIVE: D-Dimer, Quant: 0.8 ug/mL-FEU — ABNORMAL HIGH (ref 0.00–0.50)

## 2020-10-05 LAB — MRSA PCR SCREENING: MRSA by PCR: POSITIVE — AB

## 2020-10-05 LAB — C-REACTIVE PROTEIN: CRP: 0.6 mg/dL (ref <1.0)

## 2020-10-05 MED ORDER — INSULIN ASPART 100 UNIT/ML IJ SOLN
0.0000 [IU] | INTRAMUSCULAR | Status: DC
  Administered 2020-10-05 (×4): 8 [IU] via SUBCUTANEOUS
  Administered 2020-10-06: 11 [IU] via SUBCUTANEOUS
  Administered 2020-10-06: 5 [IU] via SUBCUTANEOUS
  Administered 2020-10-06 (×2): 8 [IU] via SUBCUTANEOUS
  Administered 2020-10-06: 11 [IU] via SUBCUTANEOUS
  Administered 2020-10-06: 5 [IU] via SUBCUTANEOUS
  Administered 2020-10-07: 8 [IU] via SUBCUTANEOUS
  Administered 2020-10-07: 3 [IU] via SUBCUTANEOUS
  Administered 2020-10-07 (×2): 5 [IU] via SUBCUTANEOUS
  Administered 2020-10-07 (×2): 3 [IU] via SUBCUTANEOUS
  Administered 2020-10-08: 8 [IU] via SUBCUTANEOUS
  Administered 2020-10-08: 5 [IU] via SUBCUTANEOUS
  Administered 2020-10-08: 3 [IU] via SUBCUTANEOUS
  Administered 2020-10-08: 5 [IU] via SUBCUTANEOUS
  Administered 2020-10-08 – 2020-10-09 (×2): 2 [IU] via SUBCUTANEOUS
  Administered 2020-10-09 – 2020-10-10 (×6): 3 [IU] via SUBCUTANEOUS
  Administered 2020-10-10: 2 [IU] via SUBCUTANEOUS
  Administered 2020-10-10 – 2020-10-12 (×9): 3 [IU] via SUBCUTANEOUS
  Administered 2020-10-12: 2 [IU] via SUBCUTANEOUS
  Administered 2020-10-12 (×4): 3 [IU] via SUBCUTANEOUS
  Administered 2020-10-13 (×2): 2 [IU] via SUBCUTANEOUS
  Administered 2020-10-13: 3 [IU] via SUBCUTANEOUS
  Administered 2020-10-14 (×2): 11 [IU] via SUBCUTANEOUS
  Administered 2020-10-14 (×2): 5 [IU] via SUBCUTANEOUS
  Administered 2020-10-14: 8 [IU] via SUBCUTANEOUS
  Administered 2020-10-14: 3 [IU] via SUBCUTANEOUS
  Administered 2020-10-14: 5 [IU] via SUBCUTANEOUS
  Administered 2020-10-15 (×4): 3 [IU] via SUBCUTANEOUS
  Administered 2020-10-15 – 2020-10-16 (×2): 2 [IU] via SUBCUTANEOUS
  Administered 2020-10-16: 3 [IU] via SUBCUTANEOUS
  Administered 2020-10-16: 2 [IU] via SUBCUTANEOUS
  Administered 2020-10-16 (×3): 3 [IU] via SUBCUTANEOUS
  Administered 2020-10-17 – 2020-10-18 (×6): 2 [IU] via SUBCUTANEOUS
  Administered 2020-10-19: 3 [IU] via SUBCUTANEOUS
  Administered 2020-10-19 (×2): 2 [IU] via SUBCUTANEOUS
  Administered 2020-10-19: 3 [IU] via SUBCUTANEOUS
  Administered 2020-10-20 – 2020-10-23 (×8): 2 [IU] via SUBCUTANEOUS
  Administered 2020-10-23 – 2020-10-24 (×2): 3 [IU] via SUBCUTANEOUS
  Administered 2020-10-24 – 2020-11-01 (×23): 2 [IU] via SUBCUTANEOUS
  Administered 2020-11-02 (×2): 3 [IU] via SUBCUTANEOUS
  Administered 2020-11-03 – 2020-11-06 (×9): 2 [IU] via SUBCUTANEOUS
  Administered 2020-11-06: 3 [IU] via SUBCUTANEOUS
  Administered 2020-11-07 – 2020-11-09 (×8): 2 [IU] via SUBCUTANEOUS
  Administered 2020-11-09: 3 [IU] via SUBCUTANEOUS
  Administered 2020-11-09 – 2020-11-14 (×8): 2 [IU] via SUBCUTANEOUS
  Administered 2020-11-14: 3 [IU] via SUBCUTANEOUS
  Administered 2020-11-15 – 2020-11-16 (×3): 2 [IU] via SUBCUTANEOUS
  Administered 2020-11-16 – 2020-11-17 (×2): 3 [IU] via SUBCUTANEOUS
  Administered 2020-11-17: 2 [IU] via SUBCUTANEOUS
  Administered 2020-11-17: 3 [IU] via SUBCUTANEOUS
  Administered 2020-11-17 – 2020-11-20 (×6): 2 [IU] via SUBCUTANEOUS
  Administered 2020-11-20: 3 [IU] via SUBCUTANEOUS
  Administered 2020-11-21 (×2): 2 [IU] via SUBCUTANEOUS
  Administered 2020-11-21: 3 [IU] via SUBCUTANEOUS
  Administered 2020-11-22 – 2020-11-23 (×10): 2 [IU] via SUBCUTANEOUS
  Administered 2020-11-24: 3 [IU] via SUBCUTANEOUS
  Administered 2020-11-24: 2 [IU] via SUBCUTANEOUS
  Administered 2020-11-24: 3 [IU] via SUBCUTANEOUS
  Administered 2020-11-25: 2 [IU] via SUBCUTANEOUS
  Administered 2020-11-25: 5 [IU] via SUBCUTANEOUS
  Administered 2020-11-25: 2 [IU] via SUBCUTANEOUS
  Administered 2020-11-25 – 2020-11-26 (×3): 5 [IU] via SUBCUTANEOUS
  Administered 2020-11-26 (×2): 2 [IU] via SUBCUTANEOUS
  Administered 2020-11-26: 3 [IU] via SUBCUTANEOUS
  Administered 2020-11-26: 8 [IU] via SUBCUTANEOUS
  Administered 2020-11-26 – 2020-11-27 (×3): 5 [IU] via SUBCUTANEOUS
  Administered 2020-11-27: 3 [IU] via SUBCUTANEOUS
  Administered 2020-11-27: 5 [IU] via SUBCUTANEOUS
  Administered 2020-11-27: 8 [IU] via SUBCUTANEOUS
  Administered 2020-11-27: 5 [IU] via SUBCUTANEOUS
  Administered 2020-11-28: 3 [IU] via SUBCUTANEOUS
  Administered 2020-11-28 (×2): 5 [IU] via SUBCUTANEOUS
  Administered 2020-11-28: 3 [IU] via SUBCUTANEOUS
  Administered 2020-11-28: 2 [IU] via SUBCUTANEOUS
  Administered 2020-11-29 (×2): 8 [IU] via SUBCUTANEOUS
  Administered 2020-11-29: 2 [IU] via SUBCUTANEOUS
  Administered 2020-11-29 – 2020-11-30 (×5): 3 [IU] via SUBCUTANEOUS
  Administered 2020-11-30: 5 [IU] via SUBCUTANEOUS
  Administered 2020-11-30: 8 [IU] via SUBCUTANEOUS
  Administered 2020-11-30: 11 [IU] via SUBCUTANEOUS
  Administered 2020-11-30 (×2): 3 [IU] via SUBCUTANEOUS
  Administered 2020-12-01 (×2): 2 [IU] via SUBCUTANEOUS
  Administered 2020-12-01: 3 [IU] via SUBCUTANEOUS
  Administered 2020-12-01: 5 [IU] via SUBCUTANEOUS
  Administered 2020-12-01 – 2020-12-02 (×2): 2 [IU] via SUBCUTANEOUS
  Administered 2020-12-02: 3 [IU] via SUBCUTANEOUS
  Administered 2020-12-02: 2 [IU] via SUBCUTANEOUS
  Administered 2020-12-02: 3 [IU] via SUBCUTANEOUS
  Administered 2020-12-02: 2 [IU] via SUBCUTANEOUS
  Administered 2020-12-02 – 2020-12-03 (×3): 3 [IU] via SUBCUTANEOUS
  Administered 2020-12-03: 2 [IU] via SUBCUTANEOUS
  Administered 2020-12-03 – 2020-12-04 (×5): 3 [IU] via SUBCUTANEOUS
  Administered 2020-12-04: 5 [IU] via SUBCUTANEOUS
  Administered 2020-12-04: 3 [IU] via SUBCUTANEOUS
  Administered 2020-12-04 – 2020-12-05 (×5): 2 [IU] via SUBCUTANEOUS
  Administered 2020-12-05 (×3): 3 [IU] via SUBCUTANEOUS
  Administered 2020-12-06 – 2020-12-07 (×2): 2 [IU] via SUBCUTANEOUS
  Administered 2020-12-07 (×2): 3 [IU] via SUBCUTANEOUS
  Administered 2020-12-07 – 2020-12-08 (×3): 2 [IU] via SUBCUTANEOUS
  Administered 2020-12-08 (×2): 3 [IU] via SUBCUTANEOUS
  Administered 2020-12-08 – 2020-12-09 (×3): 2 [IU] via SUBCUTANEOUS
  Administered 2020-12-09: 3 [IU] via SUBCUTANEOUS
  Administered 2020-12-09 (×3): 2 [IU] via SUBCUTANEOUS
  Administered 2020-12-09 – 2020-12-10 (×2): 3 [IU] via SUBCUTANEOUS
  Administered 2020-12-10: 2 [IU] via SUBCUTANEOUS
  Administered 2020-12-10: 5 [IU] via SUBCUTANEOUS
  Administered 2020-12-10 – 2020-12-11 (×3): 2 [IU] via SUBCUTANEOUS
  Administered 2020-12-11: 3 [IU] via SUBCUTANEOUS
  Administered 2020-12-11 (×3): 2 [IU] via SUBCUTANEOUS
  Administered 2020-12-12 (×2): 3 [IU] via SUBCUTANEOUS
  Administered 2020-12-12 (×2): 2 [IU] via SUBCUTANEOUS
  Administered 2020-12-12: 3 [IU] via SUBCUTANEOUS
  Administered 2020-12-13 – 2020-12-15 (×5): 2 [IU] via SUBCUTANEOUS
  Administered 2020-12-15: 3 [IU] via SUBCUTANEOUS
  Administered 2020-12-15 – 2020-12-23 (×23): 2 [IU] via SUBCUTANEOUS
  Administered 2020-12-23: 3 [IU] via SUBCUTANEOUS
  Administered 2020-12-23 – 2020-12-26 (×9): 2 [IU] via SUBCUTANEOUS
  Administered 2020-12-27: 3 [IU] via SUBCUTANEOUS
  Administered 2020-12-27: 2 [IU] via SUBCUTANEOUS
  Administered 2020-12-27: 3 [IU] via SUBCUTANEOUS
  Administered 2020-12-28: 2 [IU] via SUBCUTANEOUS
  Administered 2020-12-28: 1 [IU] via SUBCUTANEOUS
  Administered 2020-12-28 (×3): 3 [IU] via SUBCUTANEOUS
  Administered 2020-12-29 (×3): 2 [IU] via SUBCUTANEOUS
  Administered 2020-12-30: 3 [IU] via SUBCUTANEOUS
  Administered 2020-12-30: 2 [IU] via SUBCUTANEOUS
  Administered 2020-12-30: 3 [IU] via SUBCUTANEOUS
  Administered 2020-12-31: 2 [IU] via SUBCUTANEOUS
  Administered 2020-12-31: 3 [IU] via SUBCUTANEOUS
  Administered 2020-12-31 (×5): 2 [IU] via SUBCUTANEOUS
  Administered 2021-01-01: 3 [IU] via SUBCUTANEOUS
  Administered 2021-01-01 – 2021-01-02 (×7): 2 [IU] via SUBCUTANEOUS
  Administered 2021-01-02 – 2021-01-03 (×2): 3 [IU] via SUBCUTANEOUS
  Administered 2021-01-03 – 2021-01-04 (×2): 2 [IU] via SUBCUTANEOUS
  Administered 2021-01-04: 3 [IU] via SUBCUTANEOUS
  Administered 2021-01-04: 2 [IU] via SUBCUTANEOUS
  Administered 2021-01-04 – 2021-01-05 (×2): 3 [IU] via SUBCUTANEOUS
  Administered 2021-01-05 – 2021-01-06 (×5): 2 [IU] via SUBCUTANEOUS
  Administered 2021-01-06: 3 [IU] via SUBCUTANEOUS
  Administered 2021-01-06 – 2021-01-09 (×9): 2 [IU] via SUBCUTANEOUS
  Administered 2021-01-09: 1 [IU] via SUBCUTANEOUS
  Administered 2021-01-09 – 2021-01-10 (×6): 2 [IU] via SUBCUTANEOUS
  Administered 2021-01-10: 3 [IU] via SUBCUTANEOUS
  Administered 2021-01-10 – 2021-01-11 (×3): 2 [IU] via SUBCUTANEOUS
  Administered 2021-01-11: 3 [IU] via SUBCUTANEOUS
  Administered 2021-01-12 – 2021-01-13 (×6): 2 [IU] via SUBCUTANEOUS
  Administered 2021-01-13 (×2): 3 [IU] via SUBCUTANEOUS
  Administered 2021-01-14 – 2021-01-15 (×3): 2 [IU] via SUBCUTANEOUS
  Administered 2021-01-15: 3 [IU] via SUBCUTANEOUS
  Administered 2021-01-15 – 2021-01-16 (×4): 2 [IU] via SUBCUTANEOUS
  Administered 2021-01-17 (×2): 3 [IU] via SUBCUTANEOUS
  Administered 2021-01-17 – 2021-01-19 (×9): 2 [IU] via SUBCUTANEOUS
  Administered 2021-01-19: 3 [IU] via SUBCUTANEOUS
  Administered 2021-01-19 (×2): 2 [IU] via SUBCUTANEOUS
  Administered 2021-01-19: 3 [IU] via SUBCUTANEOUS
  Administered 2021-01-20 (×2): 2 [IU] via SUBCUTANEOUS
  Administered 2021-01-20: 3 [IU] via SUBCUTANEOUS
  Administered 2021-01-20: 2 [IU] via SUBCUTANEOUS
  Administered 2021-01-20: 3 [IU] via SUBCUTANEOUS
  Administered 2021-01-21 – 2021-01-25 (×22): 2 [IU] via SUBCUTANEOUS
  Administered 2021-01-25 – 2021-01-26 (×2): 3 [IU] via SUBCUTANEOUS
  Administered 2021-01-26: 2 [IU] via SUBCUTANEOUS
  Administered 2021-01-26: 3 [IU] via SUBCUTANEOUS
  Administered 2021-01-26 – 2021-01-27 (×4): 2 [IU] via SUBCUTANEOUS

## 2020-10-05 MED ORDER — HYDRALAZINE HCL 20 MG/ML IJ SOLN
10.0000 mg | INTRAMUSCULAR | Status: DC | PRN
  Administered 2020-10-05 – 2020-10-08 (×6): 10 mg via INTRAVENOUS
  Filled 2020-10-05 (×6): qty 1

## 2020-10-05 MED ORDER — CHLORHEXIDINE GLUCONATE CLOTH 2 % EX PADS
6.0000 | MEDICATED_PAD | Freq: Every day | CUTANEOUS | Status: DC
  Administered 2020-10-06 – 2020-11-11 (×39): 6 via TOPICAL

## 2020-10-05 MED ORDER — FENTANYL CITRATE (PF) 100 MCG/2ML IJ SOLN
25.0000 ug | INTRAMUSCULAR | Status: DC | PRN
  Administered 2020-10-05 – 2020-10-13 (×2): 25 ug via INTRAVENOUS
  Filled 2020-10-05 (×3): qty 2

## 2020-10-05 MED ORDER — POLYETHYLENE GLYCOL 3350 17 G PO PACK
17.0000 g | PACK | Freq: Every day | ORAL | Status: DC
  Administered 2020-10-05 – 2020-10-17 (×11): 17 g
  Filled 2020-10-05 (×12): qty 1

## 2020-10-05 MED ORDER — FENTANYL CITRATE (PF) 100 MCG/2ML IJ SOLN
25.0000 ug | INTRAMUSCULAR | Status: DC | PRN
  Administered 2020-10-06 (×2): 100 ug via INTRAVENOUS
  Administered 2020-10-06: 75 ug via INTRAVENOUS
  Administered 2020-10-06 – 2020-10-09 (×13): 100 ug via INTRAVENOUS
  Administered 2020-10-12: 50 ug via INTRAVENOUS
  Filled 2020-10-05 (×18): qty 2

## 2020-10-05 MED ORDER — DOCUSATE SODIUM 50 MG/5ML PO LIQD
100.0000 mg | Freq: Two times a day (BID) | ORAL | Status: DC
  Administered 2020-10-05 – 2020-10-11 (×11): 100 mg
  Filled 2020-10-05 (×12): qty 10

## 2020-10-05 MED ORDER — MUPIROCIN 2 % EX OINT
1.0000 "application " | TOPICAL_OINTMENT | Freq: Two times a day (BID) | CUTANEOUS | Status: AC
  Administered 2020-10-05 – 2020-10-10 (×10): 1 via NASAL
  Filled 2020-10-05: qty 22

## 2020-10-05 NOTE — Procedures (Signed)
Tracheostomy Change Note  Patient Details:   Name: TARIN JOHNDROW DOB: May 01, 1955 MRN: [id number]    Airway Documentation:     Evaluation  O2 sats: stable throughout Complications: No apparent complications Patient did tolerate procedure well. Bilateral Breath Sounds: Expiratory wheezes   Pt changed to a #6 shiley XLT distal cuffed to put back on the ventilator. Pt tolerated well. Accurate placement confirmed by color change on easy cap monometer and  bilateral breath sounds.  RT will continue to monitor.   Forest Becker 10/05/2020, 5:05 PM

## 2020-10-05 NOTE — Procedures (Signed)
Bronchoscopy Procedure Note  Sonia Drake  [id number]  12/01/1955  Date:10/05/20  Time:2:19 PM   Provider Performing:Kemiah Booz P Carlis Abbott   Procedure(s):  Flexible Bronchoscopy [phone])  Indication(s) Acute respiratory failure  Consent Unable to obtain consent due to emergent nature of procedure.  Anesthesia None required   Time Out Verified patient identification, verified procedure, site/side was marked, verified correct patient position, special equipment/implants available, medications/allergies/relevant history reviewed, required imaging and test results available.   Sterile Technique Usual hand hygiene, masks, gowns, and gloves were used   Procedure Description Pre-oxygenated via TC. Bronchoscope advanced through tracheostomy tube and into existing trachea-- Shiley #8 XLT cuffed. Trachea with dynamic collapse, but no granulation tissue or tissue occluding distal trach. Thick clear secretions suctioned out. Existing trach removed and distal XLT shiley #6 cuffless replaced. Good positioning confirmed with view of the trachea via bronchoscopy through new trach. CO2 color detector confirmed. Work of breathing and expiratory accessory muscle use improved after trach change.  Findings: see above   Complications/Tolerance None; patient tolerated the procedure well. Chest X-ray is not needed post procedure.   EBL Minimal   Specimen(s) None  Daughter updated after trach change. She confirmed that she does not think that her trach has ever been changed since it was originally placed in 12/2019. She would like for her to follow up in trach clinic with Sonia Drake after discharge.  Sonia Hy, DO 10/05/20 2:25 PM [facility]

## 2020-10-05 NOTE — Progress Notes (Signed)
PROGRESS NOTE                                                                                                                                                                                                             Patient Demographics:    Sonia Drake, is a 65 y.o. female, DOB - 1955/09/13, WUJ:[id number]  Outpatient Primary MD for the patient is Eloisa Northern, MD    LOS - 4  Admit date - 10/04/2020    Chief Complaint  Patient presents with   Respiratory Distress       Brief Narrative (HPI from H&P)  Sonia Drake is a 65 y.o. female with medical history significant of asthma/COPD, CHF, cardiac arrest with PEA/CPR with chronic respiratory failure on permanent trach with supplemental oxygen at 6L,  type 2 DM, GERD, seizures and severe hypoxic-ischemic encephalopathy who presented to ER via EMS for respiratory distress from SNF.   Subjective:    Shevette Bess was seen and examined this morning.  Eyes are open, does not follow any verbal command, does not respond to pain stimuli, on 10 L of high flow oxygen via tracheostomy satting 99% Tachypneic, tachycardic.  Nursing staff present at bedside... Per nursing staff patient remains encephalopathic with no acute changes     Assessment  & Plan :     Chronic Hypoxic Resp.  -Remains on 10 L of oxygen via tracheostomy, satting 99% -Failure due to anoxic brain injury requiring tracheostomy and PEG tube.   -Now suspicious for sepsis due to aspiration pneumonia causing gram-positive bacteremia 2 out of 2 with staph epidermis and staph hominis, currently on broad-spectrum antibiotics and sepsis pathophysiology improving, PCCM is following, steroids per PCCM,  Asthma exacerbation.  Echo stable and repeat blood cultures drawn on 10/02/2020 are so far negative.  Patient does have tracheostomy hence there is a potential source for skin flora to infect her versus contamination of the  blood culture.   ID will be consulted to further stream down antibiotics.... Remains on broad-spectrum antibiotics   Long-term prognosis is extremely poor due to underlying anoxic brain injury and poor functional status, will continue supportive care and monitor.  Family wants aggressive measures for now.  Note at baseline patient wears a trach collar with 6 L per minute, currently on 10 L. VQ scan ordered upon admission negative for  pulmonary embolism Echo noted with no right-sided strain preserved EF of 60%.    Family remain unrealistic regarding goals of care, CODE STATUS-ethics committee has been called_   SpO2: 99 % O2 Flow Rate (L/min): 10 L/min FiO2 (%): 60 %  2.  Anoxic brain injury due to PEA arrest in the past (August 2021 - asthma attack induced)   Trached and pegged at baseline.  Minimal to no response to verbal stimuli only responds to painful stimuli.  Poor functional status with history of seizures.  Continue supportive care with Keppra.  Poor prognosis.   Previously detailed discussion with daughter was not fruitful-family remains unrealistic regarding goals of care and CODE STATUS -Ethics committee has been called   Extensive discussion with Dr. Thedore Mins and patient's daughter:  patient seen by neurology and they agree that patient has extremely poor long-term outlook, nearly no chance of meaningful recovery at this time, conveyed to the daughter unfortunately at this time she would want to continue aggressive measures, she clearly has poor understanding of patient's medical condition and seems to have somewhat unrealistic expectations.  3.  Chronic diastolic CHF EF 60% on echocardiogram in December 2021.   -Mainly wheezing with rhonchi, minimal crackles IV fluid has been stopped   4.  IV PPI.... To be continued in the face of his steroids  5.  History of seizures with anoxic brain injury.  On Keppra.... Patient remains unresponsive  6.  COPD/ Asthma -remains acute on  chronic respiratory failure, on 10 L of oxygen via trach satting 99%   supportive Rx. Steroids per PCCM.  7. DM type II.  On sliding scale and Lantus.  Feeding via PEG tube -adjusting sliding scale to every 4 hours  Lab Results  Component Value Date   HGBA1C 7.0 (H) 10-29-20   CBG (last 3)  Recent Labs    10/05/20 0014 10/05/20 0409 10/05/20 0751  GLUCAP 232* 267* 258*     Nutrition Problem: Nutrition Problem: Increased nutrient needs Etiology: wound healing Signs/Symptoms: estimated needs Interventions: Tube feeding  Obesity: Estimated body mass index is 36.05 kg/m as calculated from the following:   Height as of this encounter: 5\' 2"  (1.575 m).   Weight as of this encounter: 89.4 kg.          Condition - Extremely Guarded  Family Communication  : Daughter [phone] on 10/02/20, 10/04/20  Code Status :  Full  Consults  :  PCCM, Neuro, ID  PUD Prophylaxis : PPI   Procedures  :      VQ -reviewed no signs or suggestion of acute PE  TTE - 1. Left ventricular ejection fraction, by estimation, is 70 to 75%. The left ventricle has hyperdynamic function. The left ventricle has no regional wall motion abnormalities. There is moderate left ventricular hypertrophy. Left ventricular diastolic parameters are consistent with Grade I diastolic dysfunction (impaired relaxation).  2. Right ventricular systolic function is normal. The right ventricular size is normal.  3. The mitral valve is normal in structure. No evidence of mitral valve regurgitation. No evidence of mitral stenosis.  4. The aortic valve has an indeterminant number of cusps. Aortic valve regurgitation is not visualized. No aortic stenosis is present.  5. The inferior vena cava is normal in size with greater than 50% respiratory variability, suggesting right atrial pressure of 3 mmHg.      Disposition Plan  :    Status is: Inpatient  Remains inpatient appropriate because:IV treatments appropriate  due to intensity  of illness or inability to take PO  Dispo: The patient is from: SNF              Anticipated d/c is to: SNF              Patient currently is not medically stable to d/c.   Difficult to place patient No  DVT Prophylaxis  :  Lovenox     Lab Results  Component Value Date   PLT 308 10/05/2020    Diet :  Diet Order             Diet NPO time specified  Diet effective now                    Inpatient Medications  Scheduled Meds:  budesonide  0.5 mg Nebulization BID   chlorhexidine  15 mL Mouth/Throat BID   clonazePAM  0.5 mg Per Tube BID   clopidogrel  75 mg Per Tube Daily   enoxaparin (LOVENOX) injection  0.5 mg/kg Subcutaneous Q24H   famotidine  20 mg Per Tube BID   feeding supplement (PROSource TF)  45 mL Per Tube BID   free water  150 mL Per Tube Q4H   insulin aspart  0-15 Units Subcutaneous TID WC   insulin aspart  0-15 Units Subcutaneous Q4H   insulin glargine  40 Units Subcutaneous QHS   ipratropium-albuterol  3 mL Nebulization Q4H   levETIRAcetam  2,500 mg Per Tube BID   loratadine  10 mg Per Tube Daily   methylPREDNISolone (SOLU-MEDROL) injection  60 mg Intravenous Q6H   montelukast  10 mg Per Tube Daily   potassium chloride  20 mEq Per Tube Daily   senna-docusate  1 tablet Per Tube q AM   Continuous Infusions:  feeding supplement (JEVITY 1.5 CAL/FIBER) 1,000 mL (10/02/20 1604)   PRN Meds:.albuterol, hydrALAZINE  Antibiotics  :    Anti-infectives (From admission, onward)    Start     Dose/Rate Route Frequency Ordered Stop   10/02/20 1400  vancomycin (VANCOREADY) IVPB 1250 mg/250 mL  Status:  Discontinued        1,250 mg 166.7 mL/hr over 90 Minutes Intravenous Every 24 hours October 16, 2020 1313 10/04/20 1221   10-16-2020 2200  ceFEPIme (MAXIPIME) 2 g in sodium chloride 0.9 % 100 mL IVPB  Status:  Discontinued        2 g 200 mL/hr over 30 Minutes Intravenous Every 8 hours 10/16/20 1313 10/04/20 1221   10-16-20 2200  metroNIDAZOLE (FLAGYL)  IVPB 500 mg  Status:  Discontinued        500 mg 100 mL/hr over 60 Minutes Intravenous Every 8 hours 10/16/20 1616 10/04/20 1221   October 16, 2020 1330  vancomycin (VANCOREADY) IVPB 2000 mg/400 mL        2,000 mg 200 mL/hr over 120 Minutes Intravenous  Once Oct 16, 2020 1257 10/16/2020 1550   October 16, 2020 1300  ceFEPIme (MAXIPIME) 2 g in sodium chloride 0.9 % 100 mL IVPB        2 g 200 mL/hr over 30 Minutes Intravenous  Once 10/16/20 1257 10-16-20 1330   Oct 16, 2020 1300  metroNIDAZOLE (FLAGYL) IVPB 500 mg        500 mg 100 mL/hr over 60 Minutes Intravenous  Once 2020-10-16 1257 16-Oct-2020 1443        Time Spent in minutes  30   Kendell Bane M.D on 10/05/2020 at 11:41 AM  To page go to [email or web address]   [facility] -  Office  [phone]     See all Orders from today for further details    Objective:   Vitals:   10/05/20 0730 10/05/20 0754 10/05/20 0854 10/05/20 0952  BP:  (!) 174/94 (!) 183/11 (!) 167/103  Pulse: 85 89 89 99  Resp: (!) 29 (!) 22 (!) 30 (!) 33  Temp:  98.5 F (36.9 C) 98 F (36.7 C) 98.2 F (36.8 C)  TempSrc:  Oral Oral Oral  SpO2: 99% 99% 99% 99%  Weight:      Height:        Wt Readings from Last 3 Encounters:  10/05/20 89.4 kg  09/08/19 96.6 kg  08/30/19 99.7 kg     Intake/Output Summary (Last 24 hours) at 10/05/2020 1141 Last data filed at 10/05/2020 0555 Gross per 24 hour  Intake 0 ml  Output 1650 ml  Net -1650 ml      Physical Exam:   General:  Tachypneic, eyes are open otherwise does not follow any verbal command, does not respond to pain stimuli  HEENT:  In respiratory distress, labored breathing O2 10 L via tracheostomy  Neuro:  Limited exam, not reactive to pain stimuli  Lungs:   Labored respiratory effort, tracheostomy, on 10 L of oxygen, audibly diffusely wheezing with rhonchi, remains tachypneic  Cardio:    S1/S2, tachycardic, no murmure, No Rubs or Gallops   Abdomen:   Soft, non-tender, bowel sounds active all four quadrants,  no  guarding or peritoneal signs.  PEG tube in place  Muscular skeletal:  Limited exam -bedbound unresponsive to pain stimuli, eyes are open does not move any of her extremities in bed. 2+ pulses,  symmetric, No pitting edema  Skin:  Dry, warm to touch, negative for any Rashes,  Wounds: Please see nursing documentation  Pressure Injury 10/02/20 Buttocks Bilateral Stage 2 -  Partial thickness loss of dermis presenting as a shallow open injury with a red, pink wound bed without slough. (Active)  10/02/20 0300  Location: Buttocks  Location Orientation: Bilateral  Staging: Stage 2 -  Partial thickness loss of dermis presenting as a shallow open injury with a red, pink wound bed without slough.  Wound Description (Comments):   Present on Admission: Yes         Data Review:    CBC Recent Labs  Lab 10/13/2020 1248 10/08/2020 1831 10/02/20 0758 10/03/20 0654 10/04/20 0051 10/05/20 0049  WBC 11.0* 16.1* 10.7* 9.5 10.9* 11.0*  HGB 11.7* 11.9* 10.4* 9.2* 9.4* 10.4*  HCT 38.5 39.6 32.8* 30.9* 30.3* 33.8*  PLT 328 302 281 279 262 308  MCV 87.1 87.4 85.0 88.0 87.1 85.8  MCH 26.5 26.3 26.9 26.2 27.0 26.4  MCHC 30.4 30.1 31.7 29.8* 31.0 30.8  RDW 15.1 15.0 15.0 15.2 15.3 15.2  LYMPHSABS 1.7  --   --  0.9 0.9 1.2  MONOABS 1.0  --   --  0.4 0.4 0.6  EOSABS 0.2  --   --  0.0 0.0 0.0  BASOSABS 0.1  --   --  0.0 0.0 0.0    Recent Labs  Lab 10/16/2020 1126 10/16/2020 1127 10/05/2020 1127 10/14/2020 1532 10/02/2020 1618 10/09/2020 1831 10/02/20 0758 10/03/20 0654 10/04/20 0051 10/04/20 0734 10/05/20 0049  NA  --  137  --   --   --   --  139 138 141  --  139  K  --  5.2*  --   --   --   --  3.5 4.2 4.3  --  4.2  CL  --  100  --   --   --   --  104 108 110  --  105  CO2  --  27  --   --   --   --  --  28  GLUCOSE  --  202*  --   --   --   --  91 241* 235*  --  257*  BUN  --  16  --   --   --   --  --  15  CREATININE  --  0.57   < >  --   --  0.43* 0.45 0.43* 0.49  --  0.48   CALCIUM  --  10.0  --   --   --   --  10.1 10.1 10.2  --  10.4*  AST  --  50*  --   --   --   --  --  21  ALT  --  28  --   --   --   --  --  28  ALKPHOS  --  112  --   --   --   --  94 88 79  --  76  BILITOT  --  0.8  --   --   --   --  0.3 0.4 0.2*  --  0.2*  ALBUMIN  --  3.0*  --   --   --   --  2.6* 2.6* 2.5*  --  2.7*  MG  --   --   --   --   --   --  1.7 2.0 2.0  --  2.0  CRP  --   --   --   --   --   --  6.9* 2.8* 0.8  --  0.6  DDIMER  --  1.47*  --   --   --   --   --   --   --  0.88* 0.80*  PROCALCITON  --   --   --   --   --   --  <0.10 <0.10 <0.10  --  <0.10  LATICACIDVEN 3.6*  --   --  2.1*  --   --   --   --   --   --   --   INR  --  1.0  --   --   --   --   --   --   --   --   --   HGBA1C  --   --   --   --   --  7.0*  --   --   --   --   --   BNP  --   --   --   --  21.5  --  63.9 104.1* 145.8*  --  115.3*   < > = values in this interval not displayed.    ------------------------------------------------------------------------------------------------------------------ No results for input(s): CHOL, HDL, LDLCALC, TRIG, CHOLHDL, LDLDIRECT in the last 72 hours.  Lab Results  Component Value Date   HGBA1C 7.0 (H) 09/26/2020   ------------------------------------------------------------------------------------------------------------------ No results for input(s): TSH, T4TOTAL, T3FREE, THYROIDAB in the last 72 hours.  Invalid input(s): FREET3  Cardiac Enzymes No results for input(s): CKMB, TROPONINI, MYOGLOBIN in the last 168 hours.  Invalid input(s): CK ------------------------------------------------------------------------------------------------------------------    Component Value Date/Time   BNP  115.3 (H) 10/05/2020 0049    Micro Results   Radiology Reports NM Pulmonary Perfusion  Result Date: 10/04/2020 CLINICAL DATA:  Respiratory failure. EXAM: NUCLEAR MEDICINE PERFUSION LUNG SCAN TECHNIQUE: Perfusion images were obtained in  multiple projections after intravenous injection of radiopharmaceutical. Ventilation scans intentionally deferred if perfusion scan and chest x-ray adequate for interpretation during COVID 19 epidemic. RADIOPHARMACEUTICALS:  4.3 mCi Tc-9m MAA IV COMPARISON:  Aug 28, 2019. FINDINGS: No wedge shaped peripheral perfusion defects to suggest acute pulmonary embolism. IMPRESSION: Normal. Electronically Signed   By: Obie Dredge M.D.   On: 10/04/2020 15:10   DG Chest Port 1 View  Result Date: 10/04/2020 CLINICAL DATA:  Shortness of breath, trach. EXAM: PORTABLE CHEST 1 VIEW COMPARISON:  Prior chest radiograph 10/02/2020 and earlier. FINDINGS: Unchanged position of a tracheostomy tube with tip projecting just below the level of the clavicular heads. The cardiomediastinal silhouette is unchanged. Evaluation of the left lung base is limited due to superimposition of soft tissues. However, there is suspected opacity within the left lung base. The right lung is clear. Superimposition of soft tissues limits evaluation for left-sided pleural effusion. No right pleural effusion. No appreciable pneumothorax. No acute bony abnormality. IMPRESSION: Superimposition of soft tissues limits evaluation of the left lung base. However, there is suspected opacity within the left lung base, which may reflect atelectasis or pneumonia. A repeat examination following patient repositioning may be helpful for further characterization. Electronically Signed   By: Jackey Loge DO   On: 10/04/2020 11:15   DG CHEST PORT 1 VIEW  Result Date: 10/02/2020 CLINICAL DATA:  Respiratory distress EXAM: PORTABLE CHEST 1 VIEW COMPARISON:  Film from earlier in the same day FINDINGS: Tracheostomy tube is noted in satisfactory position. Cardiac shadow is unremarkable. The lungs are clear bilaterally. No bony abnormality is seen. IMPRESSION: Acute abnormality noted. Electronically Signed   By: Alcide Clever M.D.   On: 10/02/2020 16:05   DG Chest Port 1  View  Result Date: 10/02/2020 CLINICAL DATA:  Shortness of breath EXAM: PORTABLE CHEST 1 VIEW COMPARISON:  October 01, 2020 FINDINGS: Evaluation is limited secondary to patient rotation. The cardiomediastinal silhouette is unchanged in contour.Tracheostomy. No pleural effusion. No pneumothorax. LEFT retrocardiac opacity, likely atelectasis. Visualized abdomen is unremarkable. Multilevel degenerative changes of the thoracic spine. IMPRESSION: No acute cardiopulmonary abnormality. Electronically Signed   By: Meda Klinefelter MD   On: 10/02/2020 07:50   DG Chest Port 1 View  Result Date: 10/15/2020 CLINICAL DATA:  Possible sepsis.  Vomiting.  Hypoxia. EXAM: PORTABLE CHEST 1 VIEW COMPARISON:  01/17/2020 FINDINGS: Tracheostomy tube in adequate position. Lungs are adequately inflated without focal airspace consolidation or effusion. Cardiomediastinal silhouette and remainder of the exam is unchanged. IMPRESSION: No acute cardiopulmonary disease. Electronically Signed   By: Elberta Fortis M.D.   On: 10/11/2020 10:45   DG Abd Portable 1V  Result Date: 10/02/2020 CLINICAL DATA:  Nausea. EXAM: PORTABLE ABDOMEN - 1 VIEW COMPARISON:  12/25/2017 FINDINGS: Normal bowel gas pattern. No abnormal stool retention. Percutaneous gastrostomy tube in place. No concerning mass effect or calcification. Left hip arthroplasty. Lumbar spine degeneration and scoliosis IMPRESSION: Normal bowel gas pattern. Electronically Signed   By: Marnee Spring M.D.   On: 10/02/2020 09:16   ECHOCARDIOGRAM COMPLETE  Result Date: 10/03/2020    ECHOCARDIOGRAM REPORT   Patient Name:   Sonia Drake Date of Exam: 10/03/2020 Medical Rec #:  [id number]      Height:       62.0 in  Accession #:    [id number]     Weight:       202.2 lb Date of Birth:  09-03-1955      BSA:          1.920 m Patient Age:    64 years       BP:           143/82 mmHg Patient Gender: F              HR:           86 bpm. Exam Location:  Inpatient Procedure: 2D Echo, Cardiac  Doppler and Color Doppler Indications:    Bacteremia R78.81  History:        Patient has prior history of Echocardiogram examinations, most                 recent 04/04/2020. Risk Factors:Morbid obesity. Tracheostomy.                 Feeding tube.  Sonographer:    Roosvelt Maser RDCS Referring Phys: [id number] LAURA P CLARK IMPRESSIONS  1. Left ventricular ejection fraction, by estimation, is 70 to 75%. The left ventricle has hyperdynamic function. The left ventricle has no regional wall motion abnormalities. There is moderate left ventricular hypertrophy. Left ventricular diastolic parameters are consistent with Grade I diastolic dysfunction (impaired relaxation).  2. Right ventricular systolic function is normal. The right ventricular size is normal.  3. The mitral valve is normal in structure. No evidence of mitral valve regurgitation. No evidence of mitral stenosis.  4. The aortic valve has an indeterminant number of cusps. Aortic valve regurgitation is not visualized. No aortic stenosis is present.  5. The inferior vena cava is normal in size with greater than 50% respiratory variability, suggesting right atrial pressure of 3 mmHg. FINDINGS  Left Ventricle: Left ventricular ejection fraction, by estimation, is 70 to 75%. The left ventricle has hyperdynamic function. The left ventricle has no regional wall motion abnormalities. The left ventricular internal cavity size was normal in size. There is moderate left ventricular hypertrophy. Left ventricular diastolic parameters are consistent with Grade I diastolic dysfunction (impaired relaxation). Right Ventricle: The right ventricular size is normal.Right ventricular systolic function is normal. Left Atrium: Left atrial size was normal in size. Right Atrium: Right atrial size was normal in size. Pericardium: There is no evidence of pericardial effusion. Mitral Valve: The mitral valve is normal in structure. No evidence of mitral valve regurgitation. No evidence of  mitral valve stenosis. Tricuspid Valve: The tricuspid valve is normal in structure. Tricuspid valve regurgitation is not demonstrated. No evidence of tricuspid stenosis. Aortic Valve: The aortic valve has an indeterminant number of cusps. Aortic valve regurgitation is not visualized. No aortic stenosis is present. Aortic valve mean gradient measures 9.0 mmHg. Aortic valve peak gradient measures 15.5 mmHg. Aortic valve area, by VTI measures 1.64 cm. Pulmonic Valve: The pulmonic valve was normal in structure. Pulmonic valve regurgitation is not visualized. No evidence of pulmonic stenosis. Aorta: The aortic root is normal in size and structure. Venous: The inferior vena cava is normal in size with greater than 50% respiratory variability, suggesting right atrial pressure of 3 mmHg.   LEFT VENTRICLE PLAX 2D LVIDd:         3.30 cm  Diastology LVIDs:         2.40 cm  LV e' medial:    5.87 cm/s LV PW:         1.40 cm  LV  E/e' medial:  13.4 LV IVS:        1.00 cm  LV e' lateral:   8.92 cm/s LVOT diam:     1.90 cm  LV E/e' lateral: 8.8 LV SV:         55 LV SV Index:   29 LVOT Area:     2.84 cm  RIGHT VENTRICLE          IVC RV Basal diam:  3.60 cm  IVC diam: 2.00 cm LEFT ATRIUM           Index       RIGHT ATRIUM           Index LA diam:      3.50 cm 1.82 cm/m  RA Area:     12.00 cm LA Vol (A4C): 56.9 ml 29.63 ml/m RA Volume:   28.00 ml  14.58 ml/m  AORTIC VALVE AV Area (Vmax):    1.42 cm AV Area (Vmean):   1.30 cm AV Area (VTI):     1.64 cm AV Vmax:           197.00 cm/s AV Vmean:          143.000 cm/s AV VTI:            0.338 m AV Peak Grad:      15.5 mmHg AV Mean Grad:      9.0 mmHg LVOT Vmax:         98.90 cm/s LVOT Vmean:        65.600 cm/s LVOT VTI:          0.195 m LVOT/AV VTI ratio: 0.58  AORTA Ao Root diam: 2.70 cm Ao Asc diam:  2.80 cm MITRAL VALVE MV Area (PHT): 4.49 cm     SHUNTS MV Decel Time: 169 msec     Systemic VTI:  0.20 m MV E velocity: 78.50 cm/s   Systemic Diam: 1.90 cm MV A velocity: 117.00  cm/s MV E/A ratio:  0.67 Olga Millers MD Electronically signed by Olga Millers MD Signature Date/Time: 10/03/2020/5:16:22 PM    Final

## 2020-10-05 NOTE — Significant Event (Addendum)
Rapid Response Event Note   Reason for Call : Tachycardia, Hypertension, Tachypnea  Initial Focused Assessment: Pt is laying in bed with labored breathing, accessory muscle use and audible wheezing. On 10L trach collar. Upon auscultation, bilateral expiratory wheezes. RRRN suctioned patient and got thick, pink tinged secretions. Janina Mayo was changed this afternoon from a #8 Shiley XLT cuffed to a #6 Shiley XLT Cuffless. She is nonverbal and unable to follow commands at baseline. Skin is warm and clammy. Palpable pulses bilaterally. Bedside RN has given PRN Hydralazine for hypertension, but medication as not been effective.  VS: T 98.1, BP 135/111. HR 140 Sinus Tachycardia per 12 lead, RR 31, 97% on 10L TC.  After DuoNeb treatment, patient no longer has audible wheezing, but does have expiratory wheezing upon auscultation. She is mildly less labored than prior to treatment. BP is down to 153/99 but HR remains 130s-140s. O2 98% on 10L TC. Dr. Chestine Spore returned to bedside to reevaluate and decision made to transfer to ICU.  Interventions:  -12 Lead EKG -Chest X-ray -Suctioned trach with catheter -RT to give scheduled Duoneb  Plan of Care:  - Transfer patient to ICU - 2M02  Event Summary:   MD Notified: Dr. Chestine Spore, CCM Call Time: 1539 Arrival Time: 1542 End Time: 1650  Marin Comment, RN

## 2020-10-05 NOTE — Progress Notes (Signed)
Results for IREM, STONEHAM (MRN [id number]) as of 10/05/2020 11:35  Ref. Range 10/04/2020 17:15 10/04/2020 20:05 10/05/2020 00:14 10/05/2020 04:09 10/05/2020 07:51  Glucose-Capillary Latest Ref Range: 70 - 99 mg/dL 149 (H) 702 (H) 637 (H) 267 (H) 258 (H)  Noted that patient is on continuous tube feedings and blood sugars greater than 180 mg/dl.   Recommend changing Novolog correction scale to every 4 hours. Add Novolog 3 units every 4 hours for tube feed coverage if blood sugars continue to be elevated.   Smith Mince RN BSN CDE Diabetes Coordinator Pager: [phone]  8am-5pm

## 2020-10-05 NOTE — Progress Notes (Signed)
NAME:  Sonia Drake, MRN:  [id number], DOB:  [birth year], LOS: 4 ADMISSION DATE:  10/15/2020, CONSULTATION DATE:  10/05/2020 REFERRING MD:  Kendell Bane, MD, CHIEF COMPLAINT:  respiratory failure, vent management   History of Present Illness:  Sonia Drake is a 65 year old woman with history of cardiac arrest with anoxic brain injury status post tracheostomy in August 2021 who presents for new patient evaluation for [facility] skilled nursing facility for worsening respiratory failure.  She is on trach collar 6 L with humidification daily.  She was discharged to [facility] initially and then transferred to skilled nursing facility after ventilator liberation.  At baseline she has bedbound, nonverbal.  She had a coughing spell earlier today at [facility] and possible aspiration.  There were copious secretions suctioned from her tracheostomy.  She was transferred to [facility], ED for further evaluation.  Here she is found to be tachycardic, tachypneic, and having worsening hypoxemia.  Chest x-ray is obtained and shows no acute cardiopulmonary process.  She has been evaluated for sepsis and being ruled out for infection.  Pulmonary's been consulted to assist with respiratory failure and trach management in the setting of sepsis.  Pertinent  Medical History  COPD Seizure disorder Type 2 DM  Significant Hospital Events: Including procedures, antibiotic start and stop dates in addition to other pertinent events   6/12 admitted from skilled nursing facility after aspiration event and copious secretions per tracheostomy 6/13 appears comfortable currently on 35% ATC. No distress. WBC ct trending down. No events overnight growing GPC clusters in two of two cultures  Interim History / Subjective:  Nebs calm down breathing but she is worse again within about 1 hour. Per RN she has been having increased WOB all morning.  Objective   Blood pressure (!) 183/11, pulse 89, temperature 98 F (36.7 C),  temperature source Oral, resp. rate (!) 30, height 5\' 2"  (1.575 m), weight 89.4 kg, SpO2 99 %.    FiO2 (%):  [60 %] 60 %   Intake/Output Summary (Last 24 hours) at 10/05/2020 1029 Last data filed at 10/05/2020 0555 Gross per 24 hour  Intake 0 ml  Output 1650 ml  Net -1650 ml    Filed Weights   10/03/20 0444 10/04/20 0400 10/05/20 0500  Weight: 91.7 kg 92.7 kg 89.4 kg    Examination: General: chronically ill appearing woman lying in bed in NAD HEENT: [place]/AT Neck: trach in place- Shiley #8 cuffed (deflated) prox XLT Neuro: Not responding to stimulation, no spontaneous movement in extremities. Intact cough reflex with tracheal suctioning, mild thick white secretions.  CV: S1S2, tachycardic PULM:  Tachypneic, using abdominal accessory expiratory muscles again.  GI: soft, NT, PEG Extremities: no peripheral edema, no cyanosis or clubbing Skin: no rashes or wounds  Labs/imaging that I havepersonally reviewed  (right click and "Reselect all SmartList Selections" daily)  WBC 11 D-dimer 0.8 BNP 115.3 PCT <0.1 VQ scan: no evidence of PE  Resolved Hospital Problem list     Assessment & Plan:   Sonia Drake is a 65 y.o. woman with history of chronic respiratory failure s/p tracheostomy in 12/2019 at [facility] after PEA arrest with anoxic brain injury. She is here from SNF for worsening shortness of breath and possible aspiration event. PCCM consulted for respiratory failure/vent management.  Trach dependence w/ Acute on chronic hypoxemic respiratory failure 2/2 likely acute asthma exacerbation Now wondering if we are dealing with tracheostomy positioning issues given ongoing symptoms despite several days of high dose steroids and  bronchodilators. Expiratory accessory muscle use suggests an obstructive process somewhere. Unlikely acute pneumonia with negative PCT levels. Leukocytosis likely due to steroids.  Anoxic encephalopathy s/p cardiac arrest-- suspect she is at baseline Likely a  component of acute pulmonary edema- has been getting lasix with improvement in frothy secretions.  Plan: -Con't routine trach care. Will change trach today to cuffless Shiley #8. Discussed with RT- not sure that she requires proximal XLT still. If flexible Shiley does not relieve obstruction, she will require bronchoscopy to evaluate if she has distal obstruction. -con't high dose steroids -con't frequent bronchodilators -con't TC; wean O2 as able  We will continue to follow for trach care.  Steffanie Dunn, DO 10/05/20 11:24 AM [facility]

## 2020-10-05 NOTE — Progress Notes (Addendum)
Notified by RR nurse that patient is still tachycardic and hypertensive with increased WOB.   BP (!) 135/115 (BP Location: Right Wrist)   Pulse (!) 141   Temp 98.1 F (36.7 C) (Axillary)   Resp (!) 31   Ht 5\' 2"  (1.575 m)   Wt 89.4 kg   SpO2 97%   BMI 36.05 kg/m   On exam she is similar to my previous exams- tachypneic, using abdominal expiratory accessory muscles. Still less stridor compared to prior to her trach exchange.  Not hyopxic. Not currently coughing. Mildly diaphoretic, warm  CXR> opacity in LUL, possibly mucus plug vs pneumonia.  Calling RT to administer repeat bronchodilator as this has been a repeat presentation between nebs for the past few days. At this point due to concern for uncontrolled asthma, B-blocker is not a great option for either HTN or HR control due to risk of increased bronchospasm. Will reassess in a few minutes after breathing treatment. Will order CPT, but avoiding hypertonic saline at this time due to potential to worsen bronchospasm.  , DO 10/05/20 4:06 PM [facility]    Reassessed at bedside. Better, but still increased expiratory WOB after nebulizer. Will transfer to medical ICU for trial of MV to relieve obstruction from dynamic airway collapse that was seen on bronch with trach exchange. Daughter and primary team updated.  10/07/20, DO 10/05/20 4:22 PM [facility]   Reassessed in the ICU. WOB decreased, HR trending down, less diaphoretic. Breathing above the vent ~25/ min. Obstructed waveforms on the vent. Will continue high dose steroids, BDs.  Total critical care time: 40 minutes  10/07/20, DO 10/05/20 5:06 PM [facility]

## 2020-10-06 ENCOUNTER — Inpatient Hospital Stay (HOSPITAL_COMMUNITY): Payer: Medicare Other

## 2020-10-06 ENCOUNTER — Encounter (HOSPITAL_COMMUNITY): Payer: Self-pay | Admitting: Family Medicine

## 2020-10-06 DIAGNOSIS — J9621 Acute and chronic respiratory failure with hypoxia: Secondary | ICD-10-CM | POA: Diagnosis not present

## 2020-10-06 DIAGNOSIS — R569 Unspecified convulsions: Secondary | ICD-10-CM | POA: Diagnosis not present

## 2020-10-06 DIAGNOSIS — A419 Sepsis, unspecified organism: Secondary | ICD-10-CM | POA: Diagnosis not present

## 2020-10-06 DIAGNOSIS — I5032 Chronic diastolic (congestive) heart failure: Secondary | ICD-10-CM | POA: Diagnosis not present

## 2020-10-06 DIAGNOSIS — Z930 Tracheostomy status: Secondary | ICD-10-CM | POA: Diagnosis not present

## 2020-10-06 DIAGNOSIS — J9601 Acute respiratory failure with hypoxia: Secondary | ICD-10-CM | POA: Diagnosis not present

## 2020-10-06 LAB — CBC WITH DIFFERENTIAL/PLATELET
Abs Immature Granulocytes: 0.33 10*3/uL — ABNORMAL HIGH (ref 0.00–0.07)
Basophils Absolute: 0.1 10*3/uL (ref 0.0–0.1)
Basophils Relative: 0 %
Eosinophils Absolute: 0 10*3/uL (ref 0.0–0.5)
Eosinophils Relative: 0 %
HCT: 39.9 % (ref 36.0–46.0)
Hemoglobin: 12 g/dL (ref 12.0–15.0)
Immature Granulocytes: 2 %
Lymphocytes Relative: 11 %
Lymphs Abs: 1.9 10*3/uL (ref 0.7–4.0)
MCH: 26.5 pg (ref 26.0–34.0)
MCHC: 30.1 g/dL (ref 30.0–36.0)
MCV: 88.3 fL (ref 80.0–100.0)
Monocytes Absolute: 1.4 10*3/uL — ABNORMAL HIGH (ref 0.1–1.0)
Monocytes Relative: 8 %
Neutro Abs: 13.3 10*3/uL — ABNORMAL HIGH (ref 1.7–7.7)
Neutrophils Relative %: 79 %
Platelets: 288 10*3/uL (ref 150–400)
RBC: 4.52 MIL/uL (ref 3.87–5.11)
RDW: 15.8 % — ABNORMAL HIGH (ref 11.5–15.5)
WBC: 16.9 10*3/uL — ABNORMAL HIGH (ref 4.0–10.5)
nRBC: 0.2 % (ref 0.0–0.2)

## 2020-10-06 LAB — COMPREHENSIVE METABOLIC PANEL
ALT: 33 U/L (ref 0–44)
AST: 19 U/L (ref 15–41)
Albumin: 3.1 g/dL — ABNORMAL LOW (ref 3.5–5.0)
Alkaline Phosphatase: 92 U/L (ref 38–126)
Anion gap: 14 (ref 5–15)
BUN: 16 mg/dL (ref 8–23)
CO2: 24 mmol/L (ref 22–32)
Calcium: 11.2 mg/dL — ABNORMAL HIGH (ref 8.9–10.3)
Chloride: 101 mmol/L (ref 98–111)
Creatinine, Ser: 0.53 mg/dL (ref 0.44–1.00)
GFR, Estimated: >60 mL/min (ref >60)
Glucose, Bld: 328 mg/dL — ABNORMAL HIGH (ref 70–99)
Potassium: 4.2 mmol/L (ref 3.5–5.1)
Sodium: 139 mmol/L (ref 135–145)
Total Bilirubin: 0.6 mg/dL (ref 0.3–1.2)
Total Protein: 6.8 g/dL (ref 6.5–8.1)

## 2020-10-06 LAB — MAGNESIUM: Magnesium: 2.1 mg/dL (ref 1.7–2.4)

## 2020-10-06 LAB — GLUCOSE, CAPILLARY
Glucose-Capillary: 253 mg/dL — ABNORMAL HIGH (ref 70–99)
Glucose-Capillary: 319 mg/dL — ABNORMAL HIGH (ref 70–99)
Glucose-Capillary: 334 mg/dL — ABNORMAL HIGH (ref 70–99)
Glucose-Capillary: 261 mg/dL — ABNORMAL HIGH (ref 70–99)
Glucose-Capillary: 214 mg/dL — ABNORMAL HIGH (ref 70–99)
Glucose-Capillary: 222 mg/dL — ABNORMAL HIGH (ref 70–99)

## 2020-10-06 LAB — BRAIN NATRIURETIC PEPTIDE: B Natriuretic Peptide: 125.4 pg/mL — ABNORMAL HIGH (ref 0.0–100.0)

## 2020-10-06 LAB — C-REACTIVE PROTEIN: CRP: 0.8 mg/dL (ref <1.0)

## 2020-10-06 LAB — PROCALCITONIN: Procalcitonin: <0.1 ng/mL

## 2020-10-06 LAB — D-DIMER, QUANTITATIVE: D-Dimer, Quant: 0.75 ug/mL-FEU — ABNORMAL HIGH (ref 0.00–0.50)

## 2020-10-06 MED ORDER — IPRATROPIUM-ALBUTEROL 0.5-2.5 (3) MG/3ML IN SOLN
3.0000 mL | Freq: Three times a day (TID) | RESPIRATORY_TRACT | Status: DC
  Administered 2020-10-06 – 2020-10-07 (×5): 3 mL via RESPIRATORY_TRACT
  Filled 2020-10-06 (×4): qty 3

## 2020-10-06 MED ORDER — FUROSEMIDE 10 MG/ML IJ SOLN
40.0000 mg | Freq: Once | INTRAMUSCULAR | Status: AC
  Administered 2020-10-06: 40 mg via INTRAVENOUS
  Filled 2020-10-06: qty 4

## 2020-10-06 MED ORDER — METHYLPREDNISOLONE SODIUM SUCC 125 MG IJ SOLR
60.0000 mg | Freq: Two times a day (BID) | INTRAMUSCULAR | Status: DC
  Administered 2020-10-06 – 2020-10-08 (×4): 60 mg via INTRAVENOUS
  Filled 2020-10-06 (×5): qty 2

## 2020-10-06 MED ORDER — ORAL CARE MOUTH RINSE
15.0000 mL | OROMUCOSAL | Status: DC
  Administered 2020-10-06 – 2020-10-15 (×78): 15 mL via OROMUCOSAL

## 2020-10-06 MED ORDER — INSULIN ASPART 100 UNIT/ML IJ SOLN
3.0000 [IU] | INTRAMUSCULAR | Status: DC
  Administered 2020-10-06 – 2020-10-14 (×47): 3 [IU] via SUBCUTANEOUS

## 2020-10-06 NOTE — Progress Notes (Signed)
Nutrition Follow-up  DOCUMENTATION CODES:   Not applicable  INTERVENTION:   Continue Jevity 1.5 @ 45 ml/hr via PEG with Prosource TF 45 ml BID.   Free water flush 150 ml every 4 hours.   Tube feeding regimen provides 1700 kcal (100% of needs), 91 grams of protein, and 821 ml of H2O.  Total free water: 1721 ml daily.  NUTRITION DIAGNOSIS:   Increased nutrient needs related to wound healing as evidenced by estimated needs.  Ongoing  GOAL:   Patient will meet greater than or equal to 90% of their needs  Met with TF  MONITOR:   Labs, Weight trends, TF tolerance, Skin, I & O's  REASON FOR ASSESSMENT:   Consult Enteral/tube feeding initiation and management  ASSESSMENT:   Sonia Drake is a 65 y.o. female with medical history significant of asthma/COPD, CHF, cardiac arrest with PEA/CPR with chronic respiratory failure on permanent trach with supplemental oxygen at 6L,  type 2 DM, GERD, seizures and severe hypoxic-ischemic encephalopathy who presented to ER via EMS for respiratory distress from SNF.  Discussed patient in ICU rounds and with RN today. S/P trach change 6/16, then transferred to the ICU for vent support.  Pt remains on TF via PEG (Jevity 1.5 at 45 ml/h with Prosource TF 45 ml BID) and tolerating well. Receiving free water flushes 150 ml every 4 hours.   Patient is currently intubated on ventilator support MV: 8 L/min Temp (24hrs), Avg:98.2 F (36.8 C), Min:97.6 F (36.4 C), Max:98.3 F (36.8 C)    Medications reviewed and include colace, novolog, lantus, keppra, solumedrol, miralax, klor-con, senokot-s.   Labs reviewed.  CBG: [id number]   Diet Order:   Diet Order             Diet NPO time specified  Diet effective now                   EDUCATION NEEDS:   No education needs have been identified at this time  Skin:  Skin Assessment: Skin Integrity Issues: Skin Integrity Issues:: Stage II Stage II: buttocks  Last BM:  6/17 type  6  Height:   Ht Readings from Last 1 Encounters:  10/05/20 _0  (1.575 m)    Weight:   Wt Readings from Last 1 Encounters:  10/05/20 88.9 kg    Ideal Body Weight:  45.5 kg  BMI:  Body mass index is 35.85 kg/m.  Estimated Nutritional Needs:   Kcal:  1600-1800  Protein:  90-105 grams  Fluid:  > 1.6 L   Lucas Mallow, RD, LDN, CNSC Please refer to Amion for contact information.

## 2020-10-06 NOTE — Progress Notes (Signed)
PROGRESS NOTE                                                                                                                                                                                                             Patient Demographics:    Sonia Drake, is a 65 y.o. female, DOB - 11/20/1955, ZOX:[id number]  Outpatient Primary MD for the patient is Sonia Northern, MD    LOS - 5  Admit date - 10-16-2020    Chief Complaint  Patient presents with   Respiratory Distress       Sonia Drake is a 65 y.o. female with medical history significant of asthma/COPD, CHF, cardiac arrest with PEA/CPR with chronic respiratory failure on permanent trach with supplemental oxygen at 6L,  type 2 DM, GERD, seizures and severe hypoxic-ischemic encephalopathy who presented to ER via EMS for respiratory distress from SNF.  6/12 admitted from skilled nursing facility after aspiration event and copious secretions per tracheostomy 6/13 appears comfortable currently on 35% ATC. No distress. WBC ct trending down. No events overnight growing GPC clusters in two of two cultures  6/16 increased work of breathing, upper airway noise, abdominal muscle use.  Bronchoscopy performed as trach exchange cuffless #8 Shiley XLT showed dynamic airway collapse, tracheal collapse. 6/16 continued respiratory distress moved to ICU, changed to cuffed #6 Shiley XLT, placed on MV with some improved comfort    Subjective:    Sonia Drake was seen and examined this morning, unresponsive, on the vent via tracheostomy. O2 flow rate 10 FiO2 40% satting 96%  Yesterday patient was transferred to ICU due to progressive respiratory failure, tachycardia, tachypnea More stable this morning Nursing staff present at bedside stating daughter did briefly visit yesterday evening  At the committee has been called    Assessment  & Plan :     Chronic on chronic respiratory failure,  progressively getting worse -Transfer to ICU, on the vent PSV - via tracheostomy with a flow rate of 10, FiO2 40%, satting 96%, PEEP of 5, pressure of 8 Last ABG 10/05/2020 at 1802 pH 7.4/PCO2 47.6/PO2 186 -Failure due to anoxic brain injury requiring tracheostomy and PEG tube.   -Now suspicious for sepsis due to aspiration pneumonia causing gram-positive bacteremia 2 out of 2 with staph epidermis and staph hominis, currently on broad-spectrum antibiotics and sepsis pathophysiology improving,  PCCM is following, steroids per PCCM,  Asthma exacerbation.  Echo stable and repeat blood cultures drawn on 10/02/2020 are so far negative.  Patient does have tracheostomy hence there is a potential source for skin flora to infect her versus contamination of the blood culture.   ID will be consulted to further stream down antibiotics.... Remains on broad-spectrum antibiotics   Long-term prognosis is extremely poor due to underlying anoxic brain injury and poor functional status, will continue supportive care and monitor.  Family wants aggressive measures for now.  Note at baseline patient wears a trach collar with 6 L per minute, currently on 10 L. VQ scan ordered upon admission negative for pulmonary embolism Echo noted with no right-sided strain preserved EF of 60%.    Family remain unrealistic regarding goals of care, CODE STATUS-ethics committee has been called_   SpO2: 96 % O2 Flow Rate (L/min): 10 L/min FiO2 (%): 40 %  2.  Anoxic brain injury due to PEA arrest in the past (August 2021 - asthma attack induced)   Trached and pegged at baseline.  Minimal to no response to verbal stimuli only responds to painful stimuli.  Poor functional status with history of seizures.  Continue supportive care with Keppra.  Poor prognosis.   Previously detailed discussion with daughter was not fruitful-family remains unrealistic regarding goals of care and CODE STATUS -Ethics committee has been called  Tried calling  daughter on 10/05/2020 did not answer Extensive discussion with Dr. Thedore Mins and patient's daughter: On 10/04/2020  patient seen by neurology and they agree that patient has extremely poor long-term outlook, nearly no chance of meaningful recovery at this time, conveyed to the daughter unfortunately at this time she would want to continue aggressive measures, she clearly has poor understanding of patient's medical condition and seems to have somewhat unrealistic expectations.  Prognosis-remains very poor   3.  Chronic diastolic CHF EF 60% on echocardiogram in December 2021.   -Mainly wheezing with rhonchi, minimal crackles IV fluid has been stopped   4.  IV PPI.... To be continued in the face of his steroids  5.  History of seizures with anoxic brain injury.  On Keppra.... Patient remains unresponsive  6.  COPD/ Asthma -remains acute on chronic respiratory failure, on 10 L of oxygen via trach satting 99%   supportive Rx. Steroids per PCCM.  7. DM type II.  On sliding scale and Lantus.  Feeding via PEG tube -adjusting sliding scale to every 4 hours  Lab Results  Component Value Date   HGBA1C 7.0 (H) October 04, 2020   CBG (last 3)  Recent Labs    10/05/20 2311 10/06/20 0259 10/06/20 0743  GLUCAP 286* 253* 319*     Nutrition Problem: Nutrition Problem: Increased nutrient needs Etiology: wound healing Signs/Symptoms: estimated needs Interventions: Tube feeding  Obesity: Estimated body mass index is 35.85 kg/m as calculated from the following:   Height as of this encounter:  (1.575 m).   Weight as of this encounter: 88.9 kg.            Condition - Extremely Guarded  Family Communication  : Daughter Sonia Drake [phone] on 10/02/20, 10/04/20,  tried calling 09/04/2020 no answer  Code Status :  Full  Consults  :  PCCM, Neuro, ID  PUD Prophylaxis : PPI   Procedures  :      VQ -reviewed no signs or suggestion of acute PE  TTE - 1. Left ventricular ejection fraction,  by estimation, is 70 to 75%.  The left ventricle has hyperdynamic function. The left ventricle has no regional wall motion abnormalities. There is moderate left ventricular hypertrophy. Left ventricular diastolic parameters are consistent with Grade I diastolic dysfunction (impaired relaxation).  2. Right ventricular systolic function is normal. The right ventricular size is normal.  3. The mitral valve is normal in structure. No evidence of mitral valve regurgitation. No evidence of mitral stenosis.  4. The aortic valve has an indeterminant number of cusps. Aortic valve regurgitation is not visualized. No aortic stenosis is present.  5. The inferior vena cava is normal in size with greater than 50% respiratory variability, suggesting right atrial pressure of 3 mmHg.      Disposition Plan  :    Status is: Inpatient  Remains inpatient appropriate because:IV treatments appropriate due to intensity of illness or inability to take PO  Dispo: The patient is from: SNF              Anticipated d/c is to: SNF              Patient currently is not medically stable to d/c.   Difficult to place patient No  DVT Prophylaxis  :  Lovenox     Lab Results  Component Value Date   PLT 288 10/06/2020    Diet :  Diet Order             Diet NPO time specified  Diet effective now                    Inpatient Medications  Scheduled Meds:  budesonide  0.5 mg Nebulization BID   chlorhexidine  15 mL Mouth/Throat BID   Chlorhexidine Gluconate Cloth  6 each Topical Daily   clonazePAM  0.5 mg Per Tube BID   clopidogrel  75 mg Per Tube Daily   docusate  100 mg Per Tube BID   enoxaparin (LOVENOX) injection  0.5 mg/kg Subcutaneous Q24H   famotidine  20 mg Per Tube BID   feeding supplement (PROSource TF)  45 mL Per Tube BID   free water  150 mL Per Tube Q4H   insulin aspart  0-15 Units Subcutaneous Q4H   insulin aspart  3 Units Subcutaneous Q4H   insulin glargine  40 Units Subcutaneous QHS    ipratropium-albuterol  3 mL Nebulization Q4H   levETIRAcetam  2,500 mg Per Tube BID   loratadine  10 mg Per Tube Daily   mouth rinse  15 mL Mouth Rinse 10 times per day   methylPREDNISolone (SOLU-MEDROL) injection  60 mg Intravenous Q12H   montelukast  10 mg Per Tube Daily   mupirocin ointment  1 application Nasal BID   polyethylene glycol  17 g Per Tube Daily   potassium chloride  20 mEq Per Tube Daily   senna-docusate  1 tablet Per Tube q AM   Continuous Infusions:  feeding supplement (JEVITY 1.5 CAL/FIBER) 1,000 mL (10/05/20 2251)   PRN Meds:.albuterol, fentaNYL (SUBLIMAZE) injection, fentaNYL (SUBLIMAZE) injection, hydrALAZINE  Antibiotics  :    Anti-infectives (From admission, onward)    Start     Dose/Rate Route Frequency Ordered Stop   10/02/20 1400  vancomycin (VANCOREADY) IVPB 1250 mg/250 mL  Status:  Discontinued        1,250 mg 166.7 mL/hr over 90 Minutes Intravenous Every 24 hours 10/08/2020 1313 10/04/20 1221   09/22/2020 2200  ceFEPIme (MAXIPIME) 2 g in sodium chloride 0.9 % 100 mL IVPB  Status:  Discontinued  2 g 200 mL/hr over 30 Minutes Intravenous Every 8 hours 10-12-2020 1313 10/04/20 1221   10-12-20 2200  metroNIDAZOLE (FLAGYL) IVPB 500 mg  Status:  Discontinued        500 mg 100 mL/hr over 60 Minutes Intravenous Every 8 hours Oct 12, 2020 1616 10/04/20 1221   2020-10-12 1330  vancomycin (VANCOREADY) IVPB 2000 mg/400 mL        2,000 mg 200 mL/hr over 120 Minutes Intravenous  Once 10-12-2020 1257 2020-10-12 1550   10/12/20 1300  ceFEPIme (MAXIPIME) 2 g in sodium chloride 0.9 % 100 mL IVPB        2 g 200 mL/hr over 30 Minutes Intravenous  Once 2020/10/12 1257 10/12/20 1330   10-12-20 1300  metroNIDAZOLE (FLAGYL) IVPB 500 mg        500 mg 100 mL/hr over 60 Minutes Intravenous  Once Oct 12, 2020 1257 2020-10-12 1443       See all Orders from today for further details    Objective:   Vitals:   10/06/20 0750 10/06/20 0800 10/06/20 0900 10/06/20 1000  BP:  (!) 150/77 (!)  149/84 (!) 146/97  Pulse:  (!) 126 (!) 123 (!) 117  Resp:  20 20 (!) 25  Temp: 98.3 F (36.8 C)     TempSrc: Oral     SpO2:  98% 98% 96%  Weight:      Height:        Wt Readings from Last 3 Encounters:  10/05/20 88.9 kg  09/08/19 96.6 kg  08/30/19 99.7 kg     Intake/Output Summary (Last 24 hours) at 10/06/2020 1059 Last data filed at 10/06/2020 1000 Gross per 24 hour  Intake 1410 ml  Output 1050 ml  Net 360 ml       Physical Exam:   General:  Unresponsive  HEENT:  On vent via tracheostomy-normocephalic, PERRL, otherwise with in Normal limits   Neuro:  CNII-XII intact. , normal motor and sensation, reflexes intact   Lungs:   On vent via tracheostomy, audible upper airway wheezing, rhonchi, diminished lower lobe breath sounds  Cardio:    S1/S2, tachycardic, no murmure, No Rubs or Gallops   Abdomen:   Soft, non-tender, bowel sounds active all four quadrants,  no guarding or peritoneal signs.  Muscular skeletal:  Patient is unresponsive-Limited exam -   2+ pulses,  symmetric, No pitting edema  Skin:  Dry, warm to touch, negative for any Rashes,  Wounds: Please see nursing documentation  Pressure Injury 10/02/20 Buttocks Bilateral Stage 2 -  Partial thickness loss of dermis presenting as a shallow open injury with a red, pink wound bed without slough. (Active)  10/02/20 0300  Location: Buttocks  Location Orientation: Bilateral  Staging: Stage 2 -  Partial thickness loss of dermis presenting as a shallow open injury with a red, pink wound bed without slough.  Wound Description (Comments):   Present on Admission: Yes             Data Review:    CBC Recent Labs  Lab Oct 12, 2020 1248 Oct 12, 2020 1831 10/02/20 0758 10/03/20 0654 10/04/20 0051 10/05/20 0049 10/05/20 1802 10/06/20 0548  WBC 11.0*   < > 10.7* 9.5 10.9* 11.0*  --  16.9*  HGB 11.7*   < > 10.4* 9.2* 9.4* 10.4* 12.9 12.0  HCT 38.5   < > 32.8* 30.9* 30.3* 33.8* 38.0 39.9  PLT 328   < > 281 279 262 308   --  288  MCV 87.1   < > 85.0  88.0 87.1 85.8  --  88.3  MCH 26.5   < > 26.9 26.2 27.0 26.4  --  26.5  MCHC 30.4   < > 31.7 29.8* 31.0 30.8  --  30.1  RDW 15.1   < > 15.0 15.2 15.3 15.2  --  15.8*  LYMPHSABS 1.7  --   --  0.9 0.9 1.2  --  1.9  MONOABS 1.0  --   --  0.4 0.4 0.6  --  1.4*  EOSABS 0.2  --   --  0.0 0.0 0.0  --  0.0  BASOSABS 0.1  --   --  0.0 0.0 0.0  --  0.1   < > = values in this interval not displayed.    Recent Labs  Lab  0000 10-25-20 1126 10/25/20 1127 2020-10-25 1532 25-Oct-2020 1618 10/25/20 1831 10/02/20 0758 10/03/20 0654 10/04/20 0051 10/04/20 0734 10/05/20 0049 10/05/20 1802 10/06/20 0548  NA   < >  --  137  --   --   --  139 138 141  --  139 141 139  K   < >  --  5.2*  --   --   --  3.5 4.2 4.3  --  4.2 4.1 4.2  CL   < >  --  100  --   --   --  104 108 110  --  105  --  101  CO2   < >  --  27  --   --   --  28 23 25   --  28  --  24  GLUCOSE   < >  --  202*  --   --   --  91 241* 235*  --  257*  --  328*  BUN   < >  --  16  --   --   --  9 14 14   --  15  --  16  CREATININE   < >  --  0.57  --   --  0.43* 0.45 0.43* 0.49  --  0.48  --  0.53  CALCIUM   < >  --  10.0  --   --   --  10.1 10.1 10.2  --  10.4*  --  11.2*  AST   < >  --  50*  --   --   --  18 20 17   --  21  --  19  ALT   < >  --  28  --   --   --  21 25 26   --  28  --  33  ALKPHOS   < >  --  112  --   --   --  94 88 79  --  76  --  92  BILITOT   < >  --  0.8  --   --   --  0.3 0.4 0.2*  --  0.2*  --  0.6  ALBUMIN   < >  --  3.0*  --   --   --  2.6* 2.6* 2.5*  --  2.7*  --  3.1*  MG  --   --   --   --   --   --  1.7 2.0 2.0  --  2.0  --  2.1  CRP  --   --   --   --   --   --  6.9* 2.8* 0.8  --  0.6  --  0.8  DDIMER  --   --  1.47*  --   --   --   --   --   --  0.88* 0.80*  --  0.75*  PROCALCITON  --   --   --   --   --   --  <0.10 <0.10 <0.10  --  <0.10  --  <0.10  LATICACIDVEN  --  3.6*  --  2.1*  --   --   --   --   --   --   --   --   --   INR  --   --  1.0  --   --   --   --   --   --   --    --   --   --   HGBA1C  --   --   --   --   --  7.0*  --   --   --   --   --   --   --   BNP  --   --   --   --    < >  --  63.9 104.1* 145.8*  --  115.3*  --  125.4*   < > = values in this interval not displayed.    ------------------------------------------------------------------------------------------------------------------ No results for input(s): CHOL, HDL, LDLCALC, TRIG, CHOLHDL, LDLDIRECT in the last 72 hours.  Lab Results  Component Value Date   HGBA1C 7.0 (H) 10/03/2020   ------------------------------------------------------------------------------------------------------------------ No results for input(s): TSH, T4TOTAL, T3FREE, THYROIDAB in the last 72 hours.  Invalid input(s): FREET3  Cardiac Enzymes No results for input(s): CKMB, TROPONINI, MYOGLOBIN in the last 168 hours.  Invalid input(s): CK ------------------------------------------------------------------------------------------------------------------    Component Value Date/Time   BNP 125.4 (H) 10/06/2020 0548    Micro Results   Radiology Reports NM Pulmonary Perfusion  Result Date: 10/04/2020 CLINICAL DATA:  Respiratory failure. EXAM: NUCLEAR MEDICINE PERFUSION LUNG SCAN TECHNIQUE: Perfusion images were obtained in multiple projections after intravenous injection of radiopharmaceutical. Ventilation scans intentionally deferred if perfusion scan and chest x-ray adequate for interpretation during COVID 19 epidemic. RADIOPHARMACEUTICALS:  4.3 mCi Tc-6m MAA IV COMPARISON:  Aug 28, 2019. FINDINGS: No wedge shaped peripheral perfusion defects to suggest acute pulmonary embolism. IMPRESSION: Normal. Electronically Signed   By: Obie Dredge M.D.   On: 10/04/2020 15:10   DG CHEST PORT 1 VIEW  Result Date: 10/06/2020 CLINICAL DATA:  Hypoxia EXAM: PORTABLE CHEST 1 VIEW COMPARISON:  October 05, 2020 FINDINGS: Tracheostomy catheter tip is 5.6 cm above carina. No pneumothorax. Consolidation within the left mid lung is  stable. The lungs elsewhere are clear. Heart is upper normal in size with pulmonary vascularity normal. No adenopathy. Scoliosis is stable. IMPRESSION: Tracheostomy as noted without pneumothorax. Airspace opacity left mid lung, stable. No new opacity. Heart upper normal in size. Electronically Signed   By: Bretta Bang III M.D.   On: 10/06/2020 07:53   DG Chest Port 1 View  Result Date: 10/05/2020 CLINICAL DATA:  Acute respiratory distress. EXAM: PORTABLE CHEST 1 VIEW COMPARISON:  10/04/2020 FINDINGS: Tracheostomy tube overlies the airway. The cardiomediastinal silhouette is unchanged with normal heart size. There is new, partially platelike opacity in the left mid lung most suggestive atelectasis. Minimal left basilar opacity is less prominent than on the prior study and also likely reflects atelectasis. The right lung is clear. No sizable pleural effusion or pneumothorax is identified. IMPRESSION: New left midlung atelectasis. Electronically  Signed   By: Sebastian Ache M.D.   On: 10/05/2020 16:05   DG Chest Port 1 View  Result Date: 10/04/2020 CLINICAL DATA:  Shortness of breath, trach. EXAM: PORTABLE CHEST 1 VIEW COMPARISON:  Prior chest radiograph 10/02/2020 and earlier. FINDINGS: Unchanged position of a tracheostomy tube with tip projecting just below the level of the clavicular heads. The cardiomediastinal silhouette is unchanged. Evaluation of the left lung base is limited due to superimposition of soft tissues. However, there is suspected opacity within the left lung base. The right lung is clear. Superimposition of soft tissues limits evaluation for left-sided pleural effusion. No right pleural effusion. No appreciable pneumothorax. No acute bony abnormality. IMPRESSION: Superimposition of soft tissues limits evaluation of the left lung base. However, there is suspected opacity within the left lung base, which may reflect atelectasis or pneumonia. A repeat examination following patient  repositioning may be helpful for further characterization. Electronically Signed   By: Jackey Loge DO   On: 10/04/2020 11:15   DG CHEST PORT 1 VIEW  Result Date: 10/02/2020 CLINICAL DATA:  Respiratory distress EXAM: PORTABLE CHEST 1 VIEW COMPARISON:  Film from earlier in the same day FINDINGS: Tracheostomy tube is noted in satisfactory position. Cardiac shadow is unremarkable. The lungs are clear bilaterally. No bony abnormality is seen. IMPRESSION: Acute abnormality noted. Electronically Signed   By: Alcide Clever M.D.   On: 10/02/2020 16:05   DG Chest Port 1 View  Result Date: 10/02/2020 CLINICAL DATA:  Shortness of breath EXAM: PORTABLE CHEST 1 VIEW COMPARISON:  29-Oct-2020 FINDINGS: Evaluation is limited secondary to patient rotation. The cardiomediastinal silhouette is unchanged in contour.Tracheostomy. No pleural effusion. No pneumothorax. LEFT retrocardiac opacity, likely atelectasis. Visualized abdomen is unremarkable. Multilevel degenerative changes of the thoracic spine. IMPRESSION: No acute cardiopulmonary abnormality. Electronically Signed   By: Meda Klinefelter MD   On: 10/02/2020 07:50   DG Chest Port 1 View  Result Date: 2020-10-29 CLINICAL DATA:  Possible sepsis.  Vomiting.  Hypoxia. EXAM: PORTABLE CHEST 1 VIEW COMPARISON:  01/17/2020 FINDINGS: Tracheostomy tube in adequate position. Lungs are adequately inflated without focal airspace consolidation or effusion. Cardiomediastinal silhouette and remainder of the exam is unchanged. IMPRESSION: No acute cardiopulmonary disease. Electronically Signed   By: Elberta Fortis M.D.   On: 10-29-2020 10:45   DG Abd Portable 1V  Result Date: 10/02/2020 CLINICAL DATA:  Nausea. EXAM: PORTABLE ABDOMEN - 1 VIEW COMPARISON:  12/25/2017 FINDINGS: Normal bowel gas pattern. No abnormal stool retention. Percutaneous gastrostomy tube in place. No concerning mass effect or calcification. Left hip arthroplasty. Lumbar spine degeneration and scoliosis  IMPRESSION: Normal bowel gas pattern. Electronically Signed   By: Marnee Spring M.D.   On: 10/02/2020 09:16   ECHOCARDIOGRAM COMPLETE  Result Date: 10/03/2020    ECHOCARDIOGRAM REPORT   Patient Name:   MONASIA LAIR Date of Exam: 10/03/2020 Medical Rec #:  [id number]      Height:       62.0 in Accession #:    [id number]     Weight:       202.2 lb Date of Birth:  06/29/1955      BSA:          1.920 m Patient Age:    64 years       BP:           143/82 mmHg Patient Gender: F              HR:  86 bpm. Exam Location:  Inpatient Procedure: 2D Echo, Cardiac Doppler and Color Doppler Indications:    Bacteremia R78.81  History:        Patient has prior history of Echocardiogram examinations, most                 recent 04/04/2020. Risk Factors:Morbid obesity. Tracheostomy.                 Feeding tube.  Sonographer:    Roosvelt Maserachel Lane RDCS Referring Phys: [id number] LAURA P CLARK IMPRESSIONS  1. Left ventricular ejection fraction, by estimation, is 70 to 75%. The left ventricle has hyperdynamic function. The left ventricle has no regional wall motion abnormalities. There is moderate left ventricular hypertrophy. Left ventricular diastolic parameters are consistent with Grade I diastolic dysfunction (impaired relaxation).  2. Right ventricular systolic function is normal. The right ventricular size is normal.  3. The mitral valve is normal in structure. No evidence of mitral valve regurgitation. No evidence of mitral stenosis.  4. The aortic valve has an indeterminant number of cusps. Aortic valve regurgitation is not visualized. No aortic stenosis is present.  5. The inferior vena cava is normal in size with greater than 50% respiratory variability, suggesting right atrial pressure of 3 mmHg. FINDINGS  Left Ventricle: Left ventricular ejection fraction, by estimation, is 70 to 75%. The left ventricle has hyperdynamic function. The left ventricle has no regional wall motion abnormalities. The left ventricular internal  cavity size was normal in size. There is moderate left ventricular hypertrophy. Left ventricular diastolic parameters are consistent with Grade I diastolic dysfunction (impaired relaxation). Right Ventricle: The right ventricular size is normal.Right ventricular systolic function is normal. Left Atrium: Left atrial size was normal in size. Right Atrium: Right atrial size was normal in size. Pericardium: There is no evidence of pericardial effusion. Mitral Valve: The mitral valve is normal in structure. No evidence of mitral valve regurgitation. No evidence of mitral valve stenosis. Tricuspid Valve: The tricuspid valve is normal in structure. Tricuspid valve regurgitation is not demonstrated. No evidence of tricuspid stenosis. Aortic Valve: The aortic valve has an indeterminant number of cusps. Aortic valve regurgitation is not visualized. No aortic stenosis is present. Aortic valve mean gradient measures 9.0 mmHg. Aortic valve peak gradient measures 15.5 mmHg. Aortic valve area, by VTI measures 1.64 cm. Pulmonic Valve: The pulmonic valve was normal in structure. Pulmonic valve regurgitation is not visualized. No evidence of pulmonic stenosis. Aorta: The aortic root is normal in size and structure. Venous: The inferior vena cava is normal in size with greater than 50% respiratory variability, suggesting right atrial pressure of 3 mmHg.   LEFT VENTRICLE PLAX 2D LVIDd:         3.30 cm  Diastology LVIDs:         2.40 cm  LV e' medial:    5.87 cm/s LV PW:         1.40 cm  LV E/e' medial:  13.4 LV IVS:        1.00 cm  LV e' lateral:   8.92 cm/s LVOT diam:     1.90 cm  LV E/e' lateral: 8.8 LV SV:         55 LV SV Index:   29 LVOT Area:     2.84 cm  RIGHT VENTRICLE          IVC RV Basal diam:  3.60 cm  IVC diam: 2.00 cm LEFT ATRIUM  Index       RIGHT ATRIUM           Index LA diam:      3.50 cm 1.82 cm/m  RA Area:     12.00 cm LA Vol (A4C): 56.9 ml 29.63 ml/m RA Volume:   28.00 ml  14.58 ml/m  AORTIC VALVE AV  Area (Vmax):    1.42 cm AV Area (Vmean):   1.30 cm AV Area (VTI):     1.64 cm AV Vmax:           197.00 cm/s AV Vmean:          143.000 cm/s AV VTI:            0.338 m AV Peak Grad:      15.5 mmHg AV Mean Grad:      9.0 mmHg LVOT Vmax:         98.90 cm/s LVOT Vmean:        65.600 cm/s LVOT VTI:          0.195 m LVOT/AV VTI ratio: 0.58  AORTA Ao Root diam: 2.70 cm Ao Asc diam:  2.80 cm MITRAL VALVE MV Area (PHT): 4.49 cm     SHUNTS MV Decel Time: 169 msec     Systemic VTI:  0.20 m MV E velocity: 78.50 cm/s   Systemic Diam: 1.90 cm MV A velocity: 117.00 cm/s MV E/A ratio:  0.67 Olga Millers MD Electronically signed by Olga Millers MD Signature Date/Time: 10/03/2020/5:16:22 PM    Final        Time Spent in minutes 55 minutes of critical care time was spent seeing examining patient, evaluating labs, reviewing medical records,   Kendell Bane M.D on 10/06/2020 at 10:59 AM  To page go to [email or web address]   [facility] -  Office  [phone]

## 2020-10-06 NOTE — Progress Notes (Signed)
eLink Physician-Brief Progress Note Patient Name: Sonia Drake DOB: 02/08/1956 MRN: [id number]   Date of Service  10/06/2020  HPI/Events of Note  Patient with multiple liquid stools.  eICU Interventions  Flexiseal ordered.        Thomasene Lot Ashling Roane 10/06/2020, 8:19 PM

## 2020-10-06 NOTE — Progress Notes (Signed)
NAME:  Sonia Drake, MRN:  [id number], DOB:  1955-10-15, LOS: 5 ADMISSION DATE:  09/29/2020, CONSULTATION DATE:  10/06/2020 REFERRING MD:  Leslye Peer, MD, CHIEF COMPLAINT:  respiratory failure, vent management   History of Present Illness:  Sonia Drake is a 65 year old woman with history of cardiac arrest with anoxic brain injury status post tracheostomy in August 2021 who presents for new patient evaluation for [facility] skilled nursing facility for worsening respiratory failure.  She is on trach collar 6 L with humidification daily.  She was discharged to [facility] initially and then transferred to skilled nursing facility after ventilator liberation.  At baseline she has bedbound, nonverbal.  She had a coughing spell earlier today at [facility] and possible aspiration.  There were copious secretions suctioned from her tracheostomy.  She was transferred to [facility], ED for further evaluation.  Here she is found to be tachycardic, tachypneic, and having worsening hypoxemia.  Chest x-ray is obtained and shows no acute cardiopulmonary process.  She has been evaluated for sepsis and being ruled out for infection.  Pulmonary's been consulted to assist with respiratory failure and trach management in the setting of sepsis.  Pertinent  Medical History  COPD Seizure disorder Type 2 DM  Significant Hospital Events: Including procedures, antibiotic start and stop dates in addition to other pertinent events   6/12 admitted from skilled nursing facility after aspiration event and copious secretions per tracheostomy 6/13 appears comfortable currently on 35% ATC. No distress. WBC ct trending down. No events overnight growing GPC clusters in two of two cultures  6/16 increased work of breathing, upper airway noise, abdominal muscle use.  Bronchoscopy performed as trach exchange cuffless #8 Shiley XLT showed dynamic airway collapse, tracheal collapse. 6/16 continued respiratory distress moved to ICU,  changed to cuffed #6 Shiley XLT, placed on MV with some improved comfort Interim History / Subjective:   Currently on pressure support 8 and tolerating, respiratory pattern more comfortable Off antibiotics since 6/15 (treated earlier in course for possible aspiration) Chest x-ray with left midlung infiltrate versus atelectasis versus pseudotumor Remains on empiric corticosteroids Has been hyperglycemic on tube feeds I/O+ 6 L total WBC 16.9  Objective   Blood pressure (!) 150/77, pulse (!) 126, temperature 98.3 F (36.8 C), temperature source Oral, resp. rate 20, height 5\' 2"  (1.575 m), weight 88.9 kg, SpO2 98 %.    Vent Mode: PSV;CPAP FiO2 (%):  [40 %-60 %] 40 % Set Rate:  [16 bmp] 16 bmp Vt Set:  [400 mL] 400 mL PEEP:  [5 cmH20] 5 cmH20 Pressure Support:  [8 cmH20] 8 cmH20 Plateau Pressure:  [16 cmH20-21 cmH20] 16 cmH20   Intake/Output Summary (Last 24 hours) at 10/06/2020 10/08/2020 Last data filed at 10/06/2020 0800 Gross per 24 hour  Intake 1320 ml  Output 450 ml  Net 870 ml   Filed Weights   10/04/20 0400 10/05/20 0500 10/05/20 1700  Weight: 92.7 kg 89.4 kg 88.9 kg    Examination: General: Thin ill-appearing woman, laying in bed, ventilated HEENT: Pupils equal Neck: Trach in place Shiley #6 cuffless proximal XLT Neuro: Does not wake to voice, responds to commands, strong cough on suctioning CV: Regular, tachycardic, no murmur PULM: Normal respiratory pattern on MV, clear bilaterally GI: nondistended, PEG in place Extremities: No edema Skin: No rash  Labs/imaging that I havepersonally reviewed  (right click and "Reselect all SmartList Selections" daily)   All labs and studies reviewed 6/17  Resolved Hospital Problem list  Assessment & Plan:   Sonia Drake is a 65 y.o. woman with history of chronic respiratory failure s/p tracheostomy in 12/2019 at [facility] after PEA arrest with anoxic brain injury. She is here from SNF for worsening shortness of breath and possible  aspiration event, currently off antibiotics.  To the ICU 6/16 with increased work of breathing, dynamic upper airway and tracheal collapse noted on bronchoscopy at time of trach change.  Now on MV and tolerating PSV 8.   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  She has some left midlung atelectasis on chest x-ray, has completed short course antibiotics, procalcitonin reassuring -Plan to push PSV and possibly ATC today 6/17 -Continue steroids and bronchodilators as ordered -Continue to hold off on antibiotics -Empiric diuretics -Follow chest x-ray -Hopefully will be able to wean from MV, if not then this will change her disposition options, would necessitate vent SNF  Chronic encephalopathy post prior cardiac arrest History of seizures -Minimizing sedating medications, suspect she is near her baseline.  She is on chronic clonazepam 0.5 mg twice daily -Continue Keppra  Diabetes mellitus with hyperglycemia -Sliding-scale insulin -Lantus 40 units -Add tube feeding coverage 6/17  Hypertension with chronic diastolic CHF -Currently hydralazine as needed  Chronic rhinitis -Singulair, Claritin  Independent CC time 32 minutes   Levy Pupa, MD, PhD 10/06/2020, 9:08 AM [facility] [phone] or if no answer before 7:00PM call [phone] For any issues after 7:00PM please call eLink [phone]

## 2020-10-06 NOTE — Plan of Care (Signed)

## 2020-10-06 NOTE — Progress Notes (Signed)
Pt placed back on full vent support due to increased WOB with accessory muscle use. RN made aware. RT to continue to monitor.

## 2020-10-06 NOTE — Progress Notes (Signed)
Pt placed on PSV 8/5 per wean protocol. Pt is tolerating well at this time. RN aware. RT to continue to monitor. 

## 2020-10-07 ENCOUNTER — Inpatient Hospital Stay (HOSPITAL_COMMUNITY): Payer: Medicare Other

## 2020-10-07 ENCOUNTER — Encounter (HOSPITAL_COMMUNITY): Payer: Self-pay | Admitting: Family Medicine

## 2020-10-07 DIAGNOSIS — Z515 Encounter for palliative care: Secondary | ICD-10-CM | POA: Diagnosis not present

## 2020-10-07 DIAGNOSIS — Z7189 Other specified counseling: Secondary | ICD-10-CM | POA: Diagnosis not present

## 2020-10-07 DIAGNOSIS — J9621 Acute and chronic respiratory failure with hypoxia: Secondary | ICD-10-CM | POA: Diagnosis not present

## 2020-10-07 DIAGNOSIS — J9601 Acute respiratory failure with hypoxia: Secondary | ICD-10-CM | POA: Diagnosis not present

## 2020-10-07 DIAGNOSIS — A419 Sepsis, unspecified organism: Secondary | ICD-10-CM | POA: Diagnosis not present

## 2020-10-07 DIAGNOSIS — I5032 Chronic diastolic (congestive) heart failure: Secondary | ICD-10-CM | POA: Diagnosis not present

## 2020-10-07 LAB — BASIC METABOLIC PANEL
Anion gap: 7 (ref 5–15)
BUN: 20 mg/dL (ref 8–23)
CO2: 31 mmol/L (ref 22–32)
Calcium: 10.6 mg/dL — ABNORMAL HIGH (ref 8.9–10.3)
Chloride: 103 mmol/L (ref 98–111)
Creatinine, Ser: 0.41 mg/dL — ABNORMAL LOW (ref 0.44–1.00)
GFR, Estimated: >60 mL/min (ref >60)
Glucose, Bld: 214 mg/dL — ABNORMAL HIGH (ref 70–99)
Potassium: 4.1 mmol/L (ref 3.5–5.1)
Sodium: 141 mmol/L (ref 135–145)

## 2020-10-07 LAB — PROCALCITONIN: Procalcitonin: <0.1 ng/mL

## 2020-10-07 LAB — GLUCOSE, CAPILLARY
Glucose-Capillary: 195 mg/dL — ABNORMAL HIGH (ref 70–99)
Glucose-Capillary: 179 mg/dL — ABNORMAL HIGH (ref 70–99)
Glucose-Capillary: 248 mg/dL — ABNORMAL HIGH (ref 70–99)
Glucose-Capillary: 200 mg/dL — ABNORMAL HIGH (ref 70–99)
Glucose-Capillary: 232 mg/dL — ABNORMAL HIGH (ref 70–99)
Glucose-Capillary: 288 mg/dL — ABNORMAL HIGH (ref 70–99)

## 2020-10-07 LAB — CBC
HCT: 33.3 % — ABNORMAL LOW (ref 36.0–46.0)
Hemoglobin: 10.3 g/dL — ABNORMAL LOW (ref 12.0–15.0)
MCH: 26.7 pg (ref 26.0–34.0)
MCHC: 30.9 g/dL (ref 30.0–36.0)
MCV: 86.3 fL (ref 80.0–100.0)
Platelets: 297 10*3/uL (ref 150–400)
RBC: 3.86 MIL/uL — ABNORMAL LOW (ref 3.87–5.11)
RDW: 15.8 % — ABNORMAL HIGH (ref 11.5–15.5)
WBC: 13.4 10*3/uL — ABNORMAL HIGH (ref 4.0–10.5)
nRBC: 0 % (ref 0.0–0.2)

## 2020-10-07 LAB — LEVETIRACETAM LEVEL: Levetiracetam Lvl: 53.9 ug/mL — ABNORMAL HIGH (ref 10.0–40.0)

## 2020-10-07 LAB — PHOSPHORUS: Phosphorus: 1.9 mg/dL — ABNORMAL LOW (ref 2.5–4.6)

## 2020-10-07 LAB — MAGNESIUM: Magnesium: 1.9 mg/dL (ref 1.7–2.4)

## 2020-10-07 MED ORDER — SODIUM PHOSPHATES 45 MMOLE/15ML IV SOLN
30.0000 mmol | Freq: Once | INTRAVENOUS | Status: AC
  Administered 2020-10-07: 30 mmol via INTRAVENOUS
  Filled 2020-10-07: qty 10

## 2020-10-07 MED ORDER — SODIUM CHLORIDE 0.9 % IV SOLN
2.0000 g | Freq: Three times a day (TID) | INTRAVENOUS | Status: DC
  Administered 2020-10-07: 2 g via INTRAVENOUS
  Filled 2020-10-07: qty 2

## 2020-10-07 MED ORDER — VANCOMYCIN HCL 1500 MG/300ML IV SOLN
1500.0000 mg | INTRAVENOUS | Status: DC
  Administered 2020-10-07 – 2020-10-08 (×2): 1500 mg via INTRAVENOUS
  Filled 2020-10-07 (×3): qty 300

## 2020-10-07 MED ORDER — CHLORHEXIDINE GLUCONATE 0.12 % MT SOLN
15.0000 mL | Freq: Two times a day (BID) | OROMUCOSAL | Status: DC
  Administered 2020-10-07 – 2020-10-08 (×2): 15 mL via OROMUCOSAL

## 2020-10-07 NOTE — Progress Notes (Signed)
   Palliative Medicine Inpatient Follow Up Note  Reason for consult:  Goals of Care "anoxic brain injury, Trach - PEG - GOC"   HPI:  Per intake H&P --> Sonia Drake is a 65 y.o. female with medical history significant of asthma/COPD, CHF, cardiac arrest with PEA/CPR with chronic respiratory failure on permanent trach with supplemental oxygen at 6L,  type 2 DM, GERD, seizures and severe hypoxic-ischemic encephalopathy who presented to ER via EMS for respiratory distress from SNF.   Palliative care has been asked to discuss goals of care in the setting of multiple chronic debilities.   Today's Discussion (10/07/2020):  *Please note that this is a verbal dictation therefore any spelling or grammatical errors are due to the "Dragon Medical One" system interpretation.  Chart reviewed.   I spoke to patients bedside RN, Tyron Russell. Patient w/o any acute events. Remains on ventilatory support.  On exam remains to be very ill appearing, not following commands.  I called patients daughter, [name]. Reaffirmed that the goals remain aggressive at this time. She feels that by doing this she is valuing her mothers wishes. Reiterated her multiple chronic co-morbidities and that the Neurology team evaluated her and felt her prognosis remains exceptionally poor. [name] hears this but shares that she again is acting in her mothers best interest.   Objective Assessment: Vital Signs Vitals:   10/07/20 0900 10/07/20 1000  BP: 103/75 (!) 142/81  Pulse: 77 74  Resp: 16 16  Temp:    SpO2: 99% 99%    Intake/Output Summary (Last 24 hours) at 10/07/2020 1122 Last data filed at 10/07/2020 1000 Gross per 24 hour  Intake 2276.67 ml  Output 550 ml  Net 1726.67 ml   Last Weight  Most recent update: 10/07/2020  3:08 AM    Weight  87.1 kg (192 lb 0.3 oz)            Gen:  Very ill appearing AA F  HEENT: Tracheostomy, Dry mucous membranes CV: Regular rate and rhythm PULM: On vent ABD: G tube in place EXT:  Gen edema Neuro: Opens eyes doesn't follow commands  SUMMARY OF RECOMMENDATIONS   FULL CODE/FULL SCOPE   Patients daughter was very clear that care would not be de-escalated unless other organ systems (kidneys/liver) started shutting down as per her mothers wishes. This will likely be a situation where limits will need to be set if continued deterioration occurs.    Ongoing incremental support given that goals are clear  Time Spent: 25 Greater than 50% of the time was spent in counseling and coordination of care ______________________________________________________________________________________ Lamarr Lulas [facility] Palliative Medicine Team Team Cell Phone: [phone] Please utilize secure chat with additional questions, if there is no response within 30 minutes please call the above phone number  Palliative Medicine Team providers are available by phone from 7am to 7pm daily and can be reached through the team cell phone.  Should this patient require assistance outside of these hours, please call the patient's attending physician.

## 2020-10-07 NOTE — Progress Notes (Addendum)
NAME:  Sonia Drake, MRN:  [id number], DOB:  1955-08-15, LOS: 6 ADMISSION DATE:  09/30/2020, CONSULTATION DATE:  10/07/2020 REFERRING MD:  Leslye Peer, MD, CHIEF COMPLAINT:  respiratory failure, vent management   History of Present Illness:  Sonia Drake is a 65 year old woman with history of cardiac arrest with anoxic brain injury status post tracheostomy in August 2021 who presents for new patient evaluation for [facility] skilled nursing facility for worsening respiratory failure.  She is on trach collar 6 L with humidification daily.  She was discharged to [facility] initially and then transferred to skilled nursing facility after ventilator liberation.  At baseline she has bedbound, nonverbal.  She had a coughing spell earlier today at [facility] and possible aspiration.  There were copious secretions suctioned from her tracheostomy.  She was transferred to [facility], ED for further evaluation.  Here she is found to be tachycardic, tachypneic, and having worsening hypoxemia.  Chest x-ray is obtained and shows no acute cardiopulmonary process.  She has been evaluated for sepsis and being ruled out for infection.  Pulmonary's been consulted to assist with respiratory failure and trach management in the setting of sepsis.  Pertinent  Medical History  COPD Seizure disorder Type 2 DM  Significant Hospital Events: Including procedures, antibiotic start and stop dates in addition to other pertinent events   6/12 admitted from skilled nursing facility after aspiration event and copious secretions per tracheostomy 6/13 appears comfortable currently on 35% ATC. No distress. WBC ct trending down. No events overnight growing GPC clusters in two of two cultures  Resp cx 6/13 >> pseudomonas + klebsiella (? Colonizers) Blood 6/12 >> 2 of 2 S. Epi + S. Capitis oxacillin resistant 6/16 increased work of breathing, upper airway noise, abdominal muscle use.  Bronchoscopy performed as trach exchange cuffless  #8 Shiley XLT showed dynamic airway collapse, tracheal collapse. 6/16 continued respiratory distress moved to ICU, changed to cuffed #6 Shiley XLT, placed on MV with some improved comfort Interim History / Subjective:   Tolerated some PSV 6/17 and then back to [facility] I/O+ 6.8L total Currently off abx   Objective   Blood pressure 100/74, pulse 83, temperature 97.7 F (36.5 C), temperature source Oral, resp. rate 11, height 5\' 2"  (1.575 m), weight 87.1 kg, SpO2 100 %.    Vent Mode: PRVC FiO2 (%):  [40 %] 40 % Set Rate:  [16 bmp] 16 bmp Vt Set:  [400 mL] 400 mL PEEP:  [5 cmH20] 5 cmH20 Pressure Support:  [8 cmH20] 8 cmH20 Plateau Pressure:  [14 cmH20-21 cmH20] 20 cmH20   Intake/Output Summary (Last 24 hours) at 10/07/2020 0714 Last data filed at 10/07/2020 0600 Gross per 24 hour  Intake 2103.33 ml  Output 1150 ml  Net 953.33 ml   Filed Weights   10/05/20 0500 10/05/20 1700 10/07/20 0307  Weight: 89.4 kg 88.9 kg 87.1 kg    Examination: General: Ill-appearing woman, laying in bed, ventilated HEENT: Pupils equal, no oral secretions, oropharynx clear Neck: Cuffed #6 proximal XLT Shiley.  Stoma looks good Neuro: Completely unresponsive, does not wake to voice or respond to commands.  She does have a cough, gag intact CV: Distant, regular, no murmur PULM: Decreased to both bases, otherwise clear GI: Nondistended, PEG in place, positive bowel sounds Extremities: No edema Skin: No rash  Labs/imaging that I havepersonally reviewed  (right click and "Reselect all SmartList Selections" daily)   All labs and studies reviewed sick/18  Resolved Hospital Problem list  Assessment & Plan:   Sonia Drake is a 65 y.o. woman with history of chronic respiratory failure s/p tracheostomy in 12/2019 at [facility] after PEA arrest with anoxic brain injury. She is here from SNF for worsening shortness of breath and possible aspiration event.  Pseudomonas and Klebsiella on respiratory culture.  To  the ICU 6/16 with increased work of breathing, dynamic upper airway and tracheal collapse noted on bronchoscopy at time of trach change.  Now on MV and tolerating PSV 8.  Staph species bacteremia 2 of 2, follow-up cultures pending.  Empiric Zosyn.   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  She has some left midlung atelectasis on chest x-ray, has completed short course antibiotics, procalcitonin reassuring -Continue to try to push PSV, to ATC if we believe she can tolerate -Continue bronchodilators, wean steroids over the next 2 to 3 days -Empiric diuretics as renal function and hemodynamics will tolerate -Suspect that Pseudomonas and Klebsiella are respiratory colonizers -follow CXR -Hopefully will be able to wean from MV, if not then this will change her disposition options, would necessitate vent SNF  Staph bacteremia, 2 of 2 cx's. S capitis is oxacillin resistant.  -repeat cx done 6/14 pending but negative.  Unclear whether drawn while patient was receiving antibiotics -Repeat blood cultures today 6/18 -Zosyn started by [facility] 6/18.  Agree with antibiotics until blood cultures finalized.  We will discuss regimen with pharmacy since the staph was oxacillin resistant.  Chronic encephalopathy post prior cardiac arrest History of seizures -On Keppra -Minimizing sedating medications -Prognosis for meaningful neurological recovery here very poor.  She will be facility dependent  Diabetes mellitus with hyperglycemia -Lantus 40 units -Sliding-scale insulin as ordered -Tube feeding coverage added 6/17  Hypertension with chronic diastolic CHF -Covering with hydralazine as needed -Add back maintenance antihypertensive regimen if indicated  Chronic  rhinitis -Singulair, loratadine  Independent CC time 32 minutes   Levy Pupa, MD, PhD 10/07/2020, 7:14 AM [facility] [phone] or if no answer before 7:00PM call [phone] For any issues after 7:00PM please call eLink [phone]

## 2020-10-07 NOTE — Progress Notes (Addendum)
PROGRESS NOTE                                                                                                                                                                                                             Patient Demographics:    Sonia Drake, is a 65 y.o. female, DOB - [birth year], ONG:[id number]  Outpatient Primary MD for the patient is Eloisa Northern, MD    LOS - 6  Admit date - 2020/10/26    Chief Complaint  Patient presents with   Respiratory Distress       Sonia Drake is a 65 y.o. female with medical history significant of asthma/COPD, CHF, cardiac arrest with PEA/CPR with chronic respiratory failure on permanent trach with supplemental oxygen at 6L,  type 2 DM, GERD, seizures and severe hypoxic-ischemic encephalopathy who presented to ER via EMS for respiratory distress from SNF.  6/12 admitted from skilled nursing facility after aspiration event and copious secretions per tracheostomy 6/12 2 of 2 S. Epi + S. Capitis oxacillin resistant 6/13/  sputum culture revealing Pseudomonas plus Klebsiella 6/13 appears comfortable currently on 35% ATC. No distress. WBC ct trending down. No events overnight growing GPC clusters in two of two cultures  6/16 increased work of breathing, upper airway noise, abdominal muscle use.  Bronchoscopy performed as trach exchange cuffless #8 Shiley XLT showed dynamic airway collapse, tracheal collapse. 6/16 continued respiratory distress moved to ICU, changed to cuffed #6 Shiley XLT, placed on MV with some improved comfort 10/07/2019 remain in ICU, on PSV 10 L... Unresponsive..  Hemodynamically stable    Subjective:    Sonia Drake was seen and examined this morning, remained on the vent via tracheostomy, O2 flow has improved from 10 to 8.5 L O2 flow rate 10 FiO2 40% satting 96%  Yesterday patient was transferred to ICU due to progressive respiratory failure, tachycardia,  tachypnea More stable this morning Nursing staff present at bedside stating daughter did briefly visit yesterday evening  At the committee has been called    Assessment  & Plan :     Chronic on chronic respiratory failure, progressively getting worse -Transfer to ICU on 10/05/2020 - on the vent PSV -via tracheostomy -Remains unresponsive, on the vent PSV, O2 flow rate 10, FiO2 reduced to 40, satting 98%  Last ABG 10/05/2020 at 1802 pH 7.4/PCO2 47.6/PO2  186 -Failure due to anoxic brain injury requiring tracheostomy and PEG tube.   -Now suspicious for sepsis due to aspiration pneumonia causing gram-positive bacteremia 2 out of 2 with staph epidermis and staph hominis, currently on broad-spectrum antibiotics and sepsis pathophysiology improving, PCCM is following, steroids per PCCM,  Asthma exacerbation.  Echo stable and repeat blood cultures drawn on 10/02/2020 are so far negative.  Patient does    Severe sepsis present on admission Staph bacteremia, 2 of 2 cx's. S capitis is oxacillin resistant.  -repeat cx done 6/14 pending but negative.   -Repeat blood cultures today 6/18 -Zosyn started 6/18.    Patient have tracheostomy hence there is a potential source for skin flora to infect her versus contamination of the blood culture.  6/12 2 of 2 S. Epi + S. Capitis oxacillin resistant 6/13/  sputum culture revealing Pseudomonas plus Klebsiella   ID: .... Antibiotics was initially discontinued, restarted 10/07/2020 Zosyn per pharmacy dosing    Long-term prognosis is extremely poor due to underlying anoxic brain injury and poor functional status, will continue supportive care and monitor.  Family wants aggressive measures for now.  Note at baseline patient wears a trach collar with 6 L per minute, currently on 10 L. VQ scan ordered upon admission negative for pulmonary embolism Echo noted with no right-sided strain preserved EF of 60%.    Family remain unrealistic regarding goals of care, CODE  STATUS-ethics committee has been called_      SpO2: 98 % O2 Flow Rate (L/min): 10 L/min FiO2 (%): 40 %  2.  Anoxic brain injury due to PEA arrest in the past (August 2021 - asthma attack induced)  -Remains encephalopathic -   Trached and pegged at baseline.  Minimal to no response to verbal stimuli only responds to painful stimuli.  Poor functional status with history of seizures.  Continue supportive care with Keppra.  Poor prognosis.   Previously detailed discussion with daughter was not fruitful-family remains unrealistic regarding goals of care and CODE STATUS -Ethics committee has been called  Tried calling daughter on 10/05/2020 did not answer Extensive discussion with Dr. Thedore MinsSingh and patient's daughter: On 10/04/2020 -remain unrealistic regarding goals of care and CODE STATUS  Prognosis-remains very poor   3.  Chronic diastolic CHF EF 60% on echocardiogram in December 2021.   -Mainly wheezing with rhonchi, minimal crackles IV fluid has been stopped Stable   4.  IV PPI.... To be continued in the face of his steroids  5.  History of seizures with anoxic brain injury.  On Keppra.... Patient remains unresponsive  6.  COPD/ Asthma  -Remains in acute respiratory failure -remains acute on chronic respiratory failure, on 10 L of oxygen via trach satting 98%  supportive Rx. Steroids per PCCM.  7. DM type II.  On sliding scale and Lantus.  Feeding via PEG tube -adjusting sliding scale to every 4 hours  Lab Results  Component Value Date   HGBA1C 7.0 (H) 01-23-21   CBG (last 3)  Recent Labs    10/07/20 0301 10/07/20 0706 10/07/20 1135  GLUCAP 195* 179* 248*     Nutrition Problem: Nutrition Problem: Increased nutrient needs Etiology: wound healing Signs/Symptoms: estimated needs Interventions: Tube feeding  Obesity: Estimated body mass index is 35.12 kg/m as calculated from the following:   Height as of this encounter: 5\' 2"  (1.575 m).   Weight as of this  encounter: 87.1 kg.            Condition -  Extremely Guarded  Family Communication  : Daughter [name] [phone] on 10/02/20, 10/04/20,  tried calling 09/04/2020 no answer  Code Status :  Full  Consults  :  PCCM, Neuro, ID  PUD Prophylaxis : PPI   Procedures  :      VQ -reviewed no signs or suggestion of acute PE  TTE - 1. Left ventricular ejection fraction, by estimation, is 70 to 75%. The left ventricle has hyperdynamic function. The left ventricle has no regional wall motion abnormalities. There is moderate left ventricular hypertrophy. Left ventricular diastolic parameters are consistent with Grade I diastolic dysfunction (impaired relaxation).  2. Right ventricular systolic function is normal. The right ventricular size is normal.  3. The mitral valve is normal in structure. No evidence of mitral valve regurgitation. No evidence of mitral stenosis.  4. The aortic valve has an indeterminant number of cusps. Aortic valve regurgitation is not visualized. No aortic stenosis is present.  5. The inferior vena cava is normal in size with greater than 50% respiratory variability, suggesting right atrial pressure of 3 mmHg.      Disposition Plan  :    Status is: Inpatient  Remains inpatient appropriate because:IV treatments appropriate due to intensity of illness or inability to take PO  Dispo: The patient is from: SNF              Anticipated d/c is to: SNF              Patient currently is not medically stable to d/c.   Difficult to place patient No  DVT Prophylaxis  :  Lovenox     Lab Results  Component Value Date   PLT 297 10/07/2020    Diet :  Diet Order             Diet NPO time specified  Diet effective now                    Inpatient Medications  Scheduled Meds:  budesonide  0.5 mg Nebulization BID   chlorhexidine  15 mL Mouth/Throat BID   Chlorhexidine Gluconate Cloth  6 each Topical Daily   clonazePAM  0.5 mg Per Tube BID   clopidogrel  75 mg  Per Tube Daily   docusate  100 mg Per Tube BID   enoxaparin (LOVENOX) injection  0.5 mg/kg Subcutaneous Q24H   famotidine  20 mg Per Tube BID   feeding supplement (PROSource TF)  45 mL Per Tube BID   free water  150 mL Per Tube Q4H   insulin aspart  0-15 Units Subcutaneous Q4H   insulin aspart  3 Units Subcutaneous Q4H   insulin glargine  40 Units Subcutaneous QHS   ipratropium-albuterol  3 mL Nebulization TID   levETIRAcetam  2,500 mg Per Tube BID   loratadine  10 mg Per Tube Daily   mouth rinse  15 mL Mouth Rinse 10 times per day   methylPREDNISolone (SOLU-MEDROL) injection  60 mg Intravenous Q12H   montelukast  10 mg Per Tube Daily   mupirocin ointment  1 application Nasal BID   polyethylene glycol  17 g Per Tube Daily   potassium chloride  20 mEq Per Tube Daily   senna-docusate  1 tablet Per Tube q AM   Continuous Infusions:  feeding supplement (JEVITY 1.5 CAL/FIBER) 1,000 mL (10/06/20 2300)   vancomycin     PRN Meds:.albuterol, fentaNYL (SUBLIMAZE) injection, fentaNYL (SUBLIMAZE) injection, hydrALAZINE  Antibiotics  :    Anti-infectives (From  admission, onward)    Start     Dose/Rate Route Frequency Ordered Stop   10/07/20 1400  vancomycin (VANCOREADY) IVPB 1500 mg/300 mL        1,500 mg 150 mL/hr over 120 Minutes Intravenous Every 24 hours 10/07/20 1223     10/07/20 1200  ceFEPIme (MAXIPIME) 2 g in sodium chloride 0.9 % 100 mL IVPB  Status:  Discontinued        2 g 200 mL/hr over 30 Minutes Intravenous Every 8 hours 10/07/20 0828 10/07/20 1221   10/02/20 1400  vancomycin (VANCOREADY) IVPB 1250 mg/250 mL  Status:  Discontinued        1,250 mg 166.7 mL/hr over 90 Minutes Intravenous Every 24 hours 10/18/2020 1313 10/04/20 1221   09/29/2020 2200  ceFEPIme (MAXIPIME) 2 g in sodium chloride 0.9 % 100 mL IVPB  Status:  Discontinued        2 g 200 mL/hr over 30 Minutes Intravenous Every 8 hours 10/12/2020 1313 10/04/20 1221   09/30/2020 2200  metroNIDAZOLE (FLAGYL) IVPB 500 mg   Status:  Discontinued        500 mg 100 mL/hr over 60 Minutes Intravenous Every 8 hours 09/26/2020 1616 10/04/20 1221   10/14/2020 1330  vancomycin (VANCOREADY) IVPB 2000 mg/400 mL        2,000 mg 200 mL/hr over 120 Minutes Intravenous  Once 09/22/2020 1257 10/16/2020 1550   10/03/2020 1300  ceFEPIme (MAXIPIME) 2 g in sodium chloride 0.9 % 100 mL IVPB        2 g 200 mL/hr over 30 Minutes Intravenous  Once 10/08/2020 1257 09/23/2020 1330   10/14/2020 1300  metroNIDAZOLE (FLAGYL) IVPB 500 mg        500 mg 100 mL/hr over 60 Minutes Intravenous  Once 10/08/2020 1257 10/13/2020 1443       See all Orders from today for further details    Objective:   Vitals:   10/07/20 1100 10/07/20 1130 10/07/20 1137 10/07/20 1200  BP: (!) 153/91 (!) 134/99  139/84  Pulse: (!) 107 (!) 108  (!) 119  Resp: Temp:   98.2 F (36.8 C)   TempSrc:   Oral   SpO2: 98% 99%  98%  Weight:      Height:        Wt Readings from Last 3 Encounters:  10/07/20 87.1 kg  09/08/19 96.6 kg  08/30/19 99.7 kg     Intake/Output Summary (Last 24 hours) at 10/07/2020 1239 Last data filed at 10/07/2020 1200 Gross per 24 hour  Intake 2307.05 ml  Output 550 ml  Net 1757.05 ml         Physical Exam:   General:  Unresponsive on the vent via trach  HEENT:  Tracheostomy patent, connected to vent, patient is unresponsive  Neuro:  Limited exam unresponsive  Lungs:   Labored breathing, on event, upper lobe wheezing, mild lower lobe crackles, positive air entry  Cardio:    S1/S2, RRR, No murmure, No Rubs or Gallops   Abdomen:   Soft, non-tender, bowel sounds active all four quadrants,  no guarding or peritoneal signs.  Muscular skeletal:  Limited exam - in bed, 2+ pulses,  symmetric,+1 pitting edema  Skin:  Dry, warm to touch,   Wounds: Please see nursing documentation  Pressure Injury 10/02/20 Buttocks Bilateral Stage 2 -  Partial thickness loss of dermis presenting as a shallow open injury with a red, pink wound bed  without slough. (Active)  10/02/20  0300  Location: Buttocks  Location Orientation: Bilateral  Staging: Stage 2 -  Partial thickness loss of dermis presenting as a shallow open injury with a red, pink wound bed without slough.  Wound Description (Comments):   Present on Admission: Yes               Data Review:    CBC Recent Labs  Lab Oct 03, 2020 1248 Oct 03, 2020 1831 10/03/20 0654 10/04/20 0051 10/05/20 0049 10/05/20 1802 10/06/20 0548 10/07/20 0106  WBC 11.0*   < > 9.5 10.9* 11.0*  --  16.9* 13.4*  HGB 11.7*   < > 9.2* 9.4* 10.4* 12.9 12.0 10.3*  HCT 38.5   < > 30.9* 30.3* 33.8* 38.0 39.9 33.3*  PLT 328   < > 279 262 308  --  288 297  MCV 87.1   < > 88.0 87.1 85.8  --  88.3 86.3  MCH 26.5   < > 26.2 27.0 26.4  --  26.5 26.7  MCHC 30.4   < > 29.8* 31.0 30.8  --  30.1 30.9  RDW 15.1   < > 15.2 15.3 15.2  --  15.8* 15.8*  LYMPHSABS 1.7  --  0.9 0.9 1.2  --  1.9  --   MONOABS 1.0  --  0.4 0.4 0.6  --  1.4*  --   EOSABS 0.2  --  0.0 0.0 0.0  --  0.0  --   BASOSABS 0.1  --  0.0 0.0 0.0  --  0.1  --    < > = values in this interval not displayed.    Recent Labs  Lab  0000 Oct 03, 2020 1126 2020/10/03 1127 Oct 03, 2020 1532 10-03-2020 1618 2020-10-03 1831 10/02/20 0758 10/03/20 0654 10/04/20 0051 10/04/20 0734 10/05/20 0049 10/05/20 1802 10/06/20 0548 10/07/20 0106  NA   < >  --  137  --   --   --  139 138 141  --  139 141 139 141  K   < >  --  5.2*  --   --   --  3.5 4.2 4.3  --  4.2 4.1 4.2 4.1  CL   < >  --  100  --   --   --  104 108 110  --  105  --  101 103  CO2   < >  --  27  --   --   --  28 23 25   --  28  --  24 31  GLUCOSE   < >  --  202*  --   --   --  91 241* 235*  --  257*  --  328* 214*  BUN   < >  --  16  --   --   --  9 14 14   --  15  --  16 20  CREATININE   < >  --  0.57  --   --  0.43* 0.45 0.43* 0.49  --  0.48  --  0.53 0.41*  CALCIUM   < >  --  10.0  --   --   --  10.1 10.1 10.2  --  10.4*  --  11.2* 10.6*  AST   < >  --  50*  --   --   --  18 20 17   --   21  --  19  --   ALT   < >  --  28  --   --   --  21 25 26   --  28  --  33  --   ALKPHOS   < >  --  112  --   --   --  94 88 79  --  76  --  92  --   BILITOT   < >  --  0.8  --   --   --  0.3 0.4 0.2*  --  0.2*  --  0.6  --   ALBUMIN   < >  --  3.0*  --   --   --  2.6* 2.6* 2.5*  --  2.7*  --  3.1*  --   MG  --   --   --   --    < >  --  1.7 2.0 2.0  --  2.0  --  2.1 1.9  CRP  --   --   --   --   --   --  6.9* 2.8* 0.8  --  0.6  --  0.8  --   DDIMER  --   --  1.47*  --   --   --   --   --   --  0.88* 0.80*  --  0.75*  --   PROCALCITON  --   --   --   --    < >  --  <0.10 <0.10 <0.10  --  <0.10  --  <0.10 <0.10  LATICACIDVEN  --  3.6*  --  2.1*  --   --   --   --   --   --   --   --   --   --   INR  --   --  1.0  --   --   --   --   --   --   --   --   --   --   --   HGBA1C  --   --   --   --   --  7.0*  --   --   --   --   --   --   --   --   BNP  --   --   --   --    < >  --  63.9 104.1* 145.8*  --  115.3*  --  125.4*  --    < > = values in this interval not displayed.    ------------------------------------------------------------------------------------------------------------------ No results for input(s): CHOL, HDL, LDLCALC, TRIG, CHOLHDL, LDLDIRECT in the last 72 hours.  Lab Results  Component Value Date   HGBA1C 7.0 (H) Oct 24, 2020   ------------------------------------------------------------------------------------------------------------------ No results for input(s): TSH, T4TOTAL, T3FREE, THYROIDAB in the last 72 hours.  Invalid input(s): FREET3  Cardiac Enzymes No results for input(s): CKMB, TROPONINI, MYOGLOBIN in the last 168 hours.  Invalid input(s): CK ------------------------------------------------------------------------------------------------------------------    Component Value Date/Time   BNP 125.4 (H) 10/06/2020 0548    Micro Results   Radiology Reports NM Pulmonary Perfusion  Result Date: 10/04/2020 CLINICAL DATA:  Respiratory failure. EXAM: NUCLEAR  MEDICINE PERFUSION LUNG SCAN TECHNIQUE: Perfusion images were obtained in multiple projections after intravenous injection of radiopharmaceutical. Ventilation scans intentionally deferred if perfusion scan and chest x-ray adequate for interpretation during COVID 19 epidemic. RADIOPHARMACEUTICALS:  4.3 mCi Tc-59m MAA IV COMPARISON:  Aug 28, 2019. FINDINGS: No wedge shaped peripheral perfusion defects to suggest acute pulmonary embolism. IMPRESSION: Normal. Electronically Signed   By: Aug 30, 2019 M.D.   On:  10/04/2020 15:10   DG Chest Port 1 View  Result Date: 10/07/2020 CLINICAL DATA:  Patient with respiratory failure. EXAM: PORTABLE CHEST 1 VIEW COMPARISON:  Chest radiograph 10/06/2020. FINDINGS: Monitoring leads overlie the patient. Tracheostomy tube terminates over the mid trachea. Patient is markedly rotated limiting evaluation. Stable cardiac and mediastinal contours. Bibasilar heterogeneous opacities. No definite pleural effusion or pneumothorax. IMPRESSION: Patient is rotated limiting evaluation. Redemonstrated bibasilar opacities which may represent atelectasis, poorly evaluated due to patient rotation. Electronically Signed   By: Annia Belt M.D.   On: 10/07/2020 09:06   DG CHEST PORT 1 VIEW  Result Date: 10/06/2020 CLINICAL DATA:  Hypoxia EXAM: PORTABLE CHEST 1 VIEW COMPARISON:  October 05, 2020 FINDINGS: Tracheostomy catheter tip is 5.6 cm above carina. No pneumothorax. Consolidation within the left mid lung is stable. The lungs elsewhere are clear. Heart is upper normal in size with pulmonary vascularity normal. No adenopathy. Scoliosis is stable. IMPRESSION: Tracheostomy as noted without pneumothorax. Airspace opacity left mid lung, stable. No new opacity. Heart upper normal in size. Electronically Signed   By: Bretta Bang III M.D.   On: 10/06/2020 07:53   DG Chest Port 1 View  Result Date: 10/05/2020 CLINICAL DATA:  Acute respiratory distress. EXAM: PORTABLE CHEST 1 VIEW COMPARISON:   10/04/2020 FINDINGS: Tracheostomy tube overlies the airway. The cardiomediastinal silhouette is unchanged with normal heart size. There is new, partially platelike opacity in the left mid lung most suggestive atelectasis. Minimal left basilar opacity is less prominent than on the prior study and also likely reflects atelectasis. The right lung is clear. No sizable pleural effusion or pneumothorax is identified. IMPRESSION: New left midlung atelectasis. Electronically Signed   By: Sebastian Ache M.D.   On: 10/05/2020 16:05   DG Chest Port 1 View  Result Date: 10/04/2020 CLINICAL DATA:  Shortness of breath, trach. EXAM: PORTABLE CHEST 1 VIEW COMPARISON:  Prior chest radiograph 10/02/2020 and earlier. FINDINGS: Unchanged position of a tracheostomy tube with tip projecting just below the level of the clavicular heads. The cardiomediastinal silhouette is unchanged. Evaluation of the left lung base is limited due to superimposition of soft tissues. However, there is suspected opacity within the left lung base. The right lung is clear. Superimposition of soft tissues limits evaluation for left-sided pleural effusion. No right pleural effusion. No appreciable pneumothorax. No acute bony abnormality. IMPRESSION: Superimposition of soft tissues limits evaluation of the left lung base. However, there is suspected opacity within the left lung base, which may reflect atelectasis or pneumonia. A repeat examination following patient repositioning may be helpful for further characterization. Electronically Signed   By: Jackey Loge DO   On: 10/04/2020 11:15   DG CHEST PORT 1 VIEW  Result Date: 10/02/2020 CLINICAL DATA:  Respiratory distress EXAM: PORTABLE CHEST 1 VIEW COMPARISON:  Film from earlier in the same day FINDINGS: Tracheostomy tube is noted in satisfactory position. Cardiac shadow is unremarkable. The lungs are clear bilaterally. No bony abnormality is seen. IMPRESSION: Acute abnormality noted. Electronically Signed    By: Alcide Clever M.D.   On: 10/02/2020 16:05   DG Chest Port 1 View  Result Date: 10/02/2020 CLINICAL DATA:  Shortness of breath EXAM: PORTABLE CHEST 1 VIEW COMPARISON:  10-17-20 FINDINGS: Evaluation is limited secondary to patient rotation. The cardiomediastinal silhouette is unchanged in contour.Tracheostomy. No pleural effusion. No pneumothorax. LEFT retrocardiac opacity, likely atelectasis. Visualized abdomen is unremarkable. Multilevel degenerative changes of the thoracic spine. IMPRESSION: No acute cardiopulmonary abnormality. Electronically Signed  By: Meda Klinefelter MD   On: 10/02/2020 07:50   DG Chest Port 1 View  Result Date: 10/07/2020 CLINICAL DATA:  Possible sepsis.  Vomiting.  Hypoxia. EXAM: PORTABLE CHEST 1 VIEW COMPARISON:  01/17/2020 FINDINGS: Tracheostomy tube in adequate position. Lungs are adequately inflated without focal airspace consolidation or effusion. Cardiomediastinal silhouette and remainder of the exam is unchanged. IMPRESSION: No acute cardiopulmonary disease. Electronically Signed   By: Elberta Fortis M.D.   On: 10/03/2020 10:45   DG Abd Portable 1V  Result Date: 10/02/2020 CLINICAL DATA:  Nausea. EXAM: PORTABLE ABDOMEN - 1 VIEW COMPARISON:  12/25/2017 FINDINGS: Normal bowel gas pattern. No abnormal stool retention. Percutaneous gastrostomy tube in place. No concerning mass effect or calcification. Left hip arthroplasty. Lumbar spine degeneration and scoliosis IMPRESSION: Normal bowel gas pattern. Electronically Signed   By: Marnee Spring M.D.   On: 10/02/2020 09:16   ECHOCARDIOGRAM COMPLETE  Result Date: 10/03/2020    ECHOCARDIOGRAM REPORT   Patient Name:   IRIDIAN READER Date of Exam: 10/03/2020 Medical Rec #:  [id number]      Height:       62.0 in Accession #:    [id number]     Weight:       202.2 lb Date of Birth:  Aug 31, 1955      BSA:          1.920 m Patient Age:    64 years       BP:           143/82 mmHg Patient Gender: F              HR:            86 bpm. Exam Location:  Inpatient Procedure: 2D Echo, Cardiac Doppler and Color Doppler Indications:    Bacteremia R78.81  History:        Patient has prior history of Echocardiogram examinations, most                 recent 04/04/2020. Risk Factors:Morbid obesity. Tracheostomy.                 Feeding tube.  Sonographer:    Roosvelt Maser RDCS Referring Phys: [id number] LAURA P CLARK IMPRESSIONS  1. Left ventricular ejection fraction, by estimation, is 70 to 75%. The left ventricle has hyperdynamic function. The left ventricle has no regional wall motion abnormalities. There is moderate left ventricular hypertrophy. Left ventricular diastolic parameters are consistent with Grade I diastolic dysfunction (impaired relaxation).  2. Right ventricular systolic function is normal. The right ventricular size is normal.  3. The mitral valve is normal in structure. No evidence of mitral valve regurgitation. No evidence of mitral stenosis.  4. The aortic valve has an indeterminant number of cusps. Aortic valve regurgitation is not visualized. No aortic stenosis is present.  5. The inferior vena cava is normal in size with greater than 50% respiratory variability, suggesting right atrial pressure of 3 mmHg. FINDINGS  Left Ventricle: Left ventricular ejection fraction, by estimation, is 70 to 75%. The left ventricle has hyperdynamic function. The left ventricle has no regional wall motion abnormalities. The left ventricular internal cavity size was normal in size. There is moderate left ventricular hypertrophy. Left ventricular diastolic parameters are consistent with Grade I diastolic dysfunction (impaired relaxation). Right Ventricle: The right ventricular size is normal.Right ventricular systolic function is normal. Left Atrium: Left atrial size was normal in size. Right Atrium: Right atrial size was normal in size. Pericardium: There  is no evidence of pericardial effusion. Mitral Valve: The mitral valve is normal in structure.  No evidence of mitral valve regurgitation. No evidence of mitral valve stenosis. Tricuspid Valve: The tricuspid valve is normal in structure. Tricuspid valve regurgitation is not demonstrated. No evidence of tricuspid stenosis. Aortic Valve: The aortic valve has an indeterminant number of cusps. Aortic valve regurgitation is not visualized. No aortic stenosis is present. Aortic valve mean gradient measures 9.0 mmHg. Aortic valve peak gradient measures 15.5 mmHg. Aortic valve area, by VTI measures 1.64 cm. Pulmonic Valve: The pulmonic valve was normal in structure. Pulmonic valve regurgitation is not visualized. No evidence of pulmonic stenosis. Aorta: The aortic root is normal in size and structure. Venous: The inferior vena cava is normal in size with greater than 50% respiratory variability, suggesting right atrial pressure of 3 mmHg.   LEFT VENTRICLE PLAX 2D LVIDd:         3.30 cm  Diastology LVIDs:         2.40 cm  LV e' medial:    5.87 cm/s LV PW:         1.40 cm  LV E/e' medial:  13.4 LV IVS:        1.00 cm  LV e' lateral:   8.92 cm/s LVOT diam:     1.90 cm  LV E/e' lateral: 8.8 LV SV:         55 LV SV Index:   29 LVOT Area:     2.84 cm  RIGHT VENTRICLE          IVC RV Basal diam:  3.60 cm  IVC diam: 2.00 cm LEFT ATRIUM           Index       RIGHT ATRIUM           Index LA diam:      3.50 cm 1.82 cm/m  RA Area:     12.00 cm LA Vol (A4C): 56.9 ml 29.63 ml/m RA Volume:   28.00 ml  14.58 ml/m  AORTIC VALVE AV Area (Vmax):    1.42 cm AV Area (Vmean):   1.30 cm AV Area (VTI):     1.64 cm AV Vmax:           197.00 cm/s AV Vmean:          143.000 cm/s AV VTI:            0.338 m AV Peak Grad:      15.5 mmHg AV Mean Grad:      9.0 mmHg LVOT Vmax:         98.90 cm/s LVOT Vmean:        65.600 cm/s LVOT VTI:          0.195 m LVOT/AV VTI ratio: 0.58  AORTA Ao Root diam: 2.70 cm Ao Asc diam:  2.80 cm MITRAL VALVE MV Area (PHT): 4.49 cm     SHUNTS MV Decel Time: 169 msec     Systemic VTI:  0.20 m MV E velocity:  78.50 cm/s   Systemic Diam: 1.90 cm MV A velocity: 117.00 cm/s MV E/A ratio:  0.67 Olga Millers MD Electronically signed by Olga Millers MD Signature Date/Time: 10/03/2020/5:16:22 PM    Final        Time Spent in minutes 55 minutes of critical care time was spent seeing examining patient, evaluating labs, reviewing medical records,   Kendell Bane M.D on 10/06/2020 at 10:59 AM  To page go to  [email or web address]   [facility] -  Office  [phone]

## 2020-10-07 NOTE — Progress Notes (Signed)
Pharmacy Antibiotic Note  Sonia Drake is a 65 y.o. female with concerns for bacteremia  Multiple species in prior blood cx - repeats drawn on abx  Repeating blood cx now prior to abx  Scr < 0.8   Height: 5\' 2"  (157.5 cm) Weight: 87.1 kg (192 lb 0.3 oz) IBW/kg (Calculated) : 50.1  Temp (24hrs), Avg:98.1 F (36.7 C), Min:97.7 F (36.5 C), Max:98.5 F (36.9 C)  Recent Labs  Lab 10-15-2020 1126 10-15-20 1127 10/15/2020 1532 2020/10/15 1831 10/03/20 0654 10/04/20 0051 10/05/20 0049 10/06/20 0548 10/07/20 0106  WBC  --    < >  --    < > 9.5 10.9* 11.0* 16.9* 13.4*  CREATININE  --    < >  --    < > 0.43* 0.49 0.48 0.53 0.41*  LATICACIDVEN 3.6*  --  2.1*  --   --   --   --   --   --    < > = values in this interval not displayed.    Estimated Creatinine Clearance: 70.9 mL/min (A) (by C-G formula based on SCr of 0.41 mg/dL (L)).    Allergies  Allergen Reactions   Contrast Media [Iodinated Diagnostic Agents] Anaphylaxis and Other (See Comments)    ** CARDIAC ARREST **   Peanuts [Peanut Oil] Shortness Of Breath and Swelling   Shellfish Allergy Shortness Of Breath and Swelling   Soap Shortness Of Breath and Swelling    LAUNDRY DETERGENT   Latex Hives   Adenosine    Eggs Or Egg-Derived Products Rash    Plan: Vanc 1500 mg q24h - est auc 480 Monitor repeat blood cx   10/09/20, PharmD, BCCCP Clinical Pharmacist [phone]  Please check AMION for all [facility] Pharmacy numbers  10/07/2020 12:24 PM

## 2020-10-07 NOTE — Progress Notes (Signed)
Phos 1.9 °Replaced per protocol  °

## 2020-10-08 ENCOUNTER — Inpatient Hospital Stay (HOSPITAL_COMMUNITY): Payer: Medicare Other

## 2020-10-08 DIAGNOSIS — J9621 Acute and chronic respiratory failure with hypoxia: Secondary | ICD-10-CM | POA: Diagnosis not present

## 2020-10-08 DIAGNOSIS — A419 Sepsis, unspecified organism: Secondary | ICD-10-CM | POA: Diagnosis not present

## 2020-10-08 LAB — CBC WITH DIFFERENTIAL/PLATELET
Abs Immature Granulocytes: 0.33 10*3/uL — ABNORMAL HIGH (ref 0.00–0.07)
Basophils Absolute: 0.1 10*3/uL (ref 0.0–0.1)
Basophils Relative: 0 %
Eosinophils Absolute: 0.1 10*3/uL (ref 0.0–0.5)
Eosinophils Relative: 1 %
HCT: 38 % (ref 36.0–46.0)
Hemoglobin: 12 g/dL (ref 12.0–15.0)
Immature Granulocytes: 2 %
Lymphocytes Relative: 18 %
Lymphs Abs: 3.4 10*3/uL (ref 0.7–4.0)
MCH: 27 pg (ref 26.0–34.0)
MCHC: 31.6 g/dL (ref 30.0–36.0)
MCV: 85.6 fL (ref 80.0–100.0)
Monocytes Absolute: 1.4 10*3/uL — ABNORMAL HIGH (ref 0.1–1.0)
Monocytes Relative: 7 %
Neutro Abs: 13.7 10*3/uL — ABNORMAL HIGH (ref 1.7–7.7)
Neutrophils Relative %: 72 %
Platelets: 394 10*3/uL (ref 150–400)
RBC: 4.44 MIL/uL (ref 3.87–5.11)
RDW: 15.9 % — ABNORMAL HIGH (ref 11.5–15.5)
WBC: 19 10*3/uL — ABNORMAL HIGH (ref 4.0–10.5)
nRBC: 0 % (ref 0.0–0.2)

## 2020-10-08 LAB — COMPREHENSIVE METABOLIC PANEL
ALT: 29 U/L (ref 0–44)
AST: 18 U/L (ref 15–41)
Albumin: 3 g/dL — ABNORMAL LOW (ref 3.5–5.0)
Alkaline Phosphatase: 83 U/L (ref 38–126)
Anion gap: 8 (ref 5–15)
BUN: 15 mg/dL (ref 8–23)
CO2: 25 mmol/L (ref 22–32)
Calcium: 10.7 mg/dL — ABNORMAL HIGH (ref 8.9–10.3)
Chloride: 103 mmol/L (ref 98–111)
Creatinine, Ser: 0.38 mg/dL — ABNORMAL LOW (ref 0.44–1.00)
GFR, Estimated: >60 mL/min (ref >60)
Glucose, Bld: 184 mg/dL — ABNORMAL HIGH (ref 70–99)
Potassium: 4.4 mmol/L (ref 3.5–5.1)
Sodium: 136 mmol/L (ref 135–145)
Total Bilirubin: 0.6 mg/dL (ref 0.3–1.2)
Total Protein: 6.4 g/dL — ABNORMAL LOW (ref 6.5–8.1)

## 2020-10-08 LAB — CULTURE, BLOOD (ROUTINE X 2)
Culture: NO GROWTH
Culture: NO GROWTH
Special Requests: ADEQUATE

## 2020-10-08 LAB — PHOSPHORUS: Phosphorus: 2.1 mg/dL — ABNORMAL LOW (ref 2.5–4.6)

## 2020-10-08 LAB — GLUCOSE, CAPILLARY
Glucose-Capillary: 257 mg/dL — ABNORMAL HIGH (ref 70–99)
Glucose-Capillary: 168 mg/dL — ABNORMAL HIGH (ref 70–99)
Glucose-Capillary: 240 mg/dL — ABNORMAL HIGH (ref 70–99)
Glucose-Capillary: 213 mg/dL — ABNORMAL HIGH (ref 70–99)
Glucose-Capillary: 150 mg/dL — ABNORMAL HIGH (ref 70–99)
Glucose-Capillary: 119 mg/dL — ABNORMAL HIGH (ref 70–99)

## 2020-10-08 MED ORDER — METOPROLOL TARTRATE 25 MG PO TABS
25.0000 mg | ORAL_TABLET | Freq: Two times a day (BID) | ORAL | Status: DC
  Administered 2020-10-08 – 2020-10-19 (×19): 25 mg
  Filled 2020-10-08 (×23): qty 1

## 2020-10-08 MED ORDER — FUROSEMIDE 10 MG/ML IJ SOLN
40.0000 mg | Freq: Two times a day (BID) | INTRAMUSCULAR | Status: AC
  Administered 2020-10-08 – 2020-10-09 (×2): 40 mg via INTRAVENOUS
  Filled 2020-10-08 (×2): qty 4

## 2020-10-08 MED ORDER — CHLORHEXIDINE GLUCONATE 0.12 % MT SOLN
15.0000 mL | Freq: Two times a day (BID) | OROMUCOSAL | Status: DC
  Administered 2020-10-08 – 2020-10-14 (×13): 15 mL via OROMUCOSAL
  Filled 2020-10-08 (×6): qty 15

## 2020-10-08 MED ORDER — IPRATROPIUM-ALBUTEROL 0.5-2.5 (3) MG/3ML IN SOLN
3.0000 mL | RESPIRATORY_TRACT | Status: DC
  Administered 2020-10-08 – 2020-10-12 (×25): 3 mL via RESPIRATORY_TRACT
  Filled 2020-10-08 (×25): qty 3

## 2020-10-08 MED ORDER — SODIUM PHOSPHATES 45 MMOLE/15ML IV SOLN
30.0000 mmol | Freq: Once | INTRAVENOUS | Status: AC
  Administered 2020-10-08: 30 mmol via INTRAVENOUS
  Filled 2020-10-08: qty 10

## 2020-10-08 MED ORDER — METOPROLOL TARTRATE 25 MG PO TABS
25.0000 mg | ORAL_TABLET | Freq: Two times a day (BID) | ORAL | Status: DC
  Administered 2020-10-08: 25 mg via ORAL
  Filled 2020-10-08: qty 1

## 2020-10-08 NOTE — Progress Notes (Signed)
PROGRESS NOTE                                                                                                                                                                                                             Patient Demographics:    Sonia Drake, is a 65 y.o. female, DOB - 1955-12-06, ZOX:[id number]  Outpatient Primary MD for the patient is Eloisa Northern, MD    LOS - 7  Admit date - 09/25/2020    Chief Complaint  Patient presents with   Respiratory Distress       Sonia Drake is a 65 y.o. female with medical history significant of asthma/COPD, CHF, cardiac arrest with PEA/CPR with chronic respiratory failure on permanent trach with supplemental oxygen at 6L,  type 2 DM, GERD, seizures and severe hypoxic-ischemic encephalopathy who presented to ER via EMS for respiratory distress from SNF.  6/12 admitted from skilled nursing facility after aspiration event and copious secretions per tracheostomy 6/12 2 of 2 S. Epi + S. Capitis oxacillin resistant 6/13/  sputum culture revealing Pseudomonas plus Klebsiella 6/13 appears comfortable currently on 35% ATC. No distress. WBC ct trending down. No events overnight growing GPC clusters in two of two cultures  6/16 increased work of breathing, upper airway noise, abdominal muscle use.  Bronchoscopy performed as trach exchange cuffless #8 Shiley XLT showed dynamic airway collapse, tracheal collapse. 6/16 continued respiratory distress moved to ICU, changed to cuffed #6 Shiley XLT, placed on MV with some improved comfort 10/07/2019 remain in ICU, on PSV 10 L... Unresponsive..  Hemodynamically stable  Discussed with PCCM Dr.Byrum -will assume care ~patient stable to be transferred out of ICU Tried hospitalist will sign off for now.      Subjective:    Sonia Drake was seen and examined today remain on the vent via tracheostomy Eyes open remain unresponsive to verbal or pain  stimuli    Assessment  & Plan :     Chronic on chronic respiratory failure, progressively getting worse -Transfer to ICU on 10/05/2020 - on the vent PSV -via tracheostomy -Remains unresponsive, on the vent PSV, O2 flow rate 10, FiO2 reduced to 40, satting 98%  Last ABG 10/05/2020 at 1802 pH 7.4/PCO2 47.6/PO2 186 -Failure due to anoxic brain injury requiring tracheostomy and PEG tube.   -Now suspicious for sepsis due to aspiration  pneumonia causing gram-positive bacteremia 2 out of 2 with staph epidermis and staph hominis, currently on broad-spectrum antibiotics and sepsis pathophysiology improving, PCCM is following, steroids per PCCM,  Asthma exacerbation.  Echo stable and repeat blood cultures drawn on 10/02/2020 are so far negative.  Patient does    Severe sepsis present on admission Staph bacteremia, 2 of 2 cx's. S capitis is oxacillin resistant.  -repeat cx done 6/14 pending but negative.   -Repeat blood cultures today 6/18 -Zosyn started 6/18.    Patient have tracheostomy hence there is a potential source for skin flora to infect her versus contamination of the blood culture.  6/12 2 of 2 S. Epi + S. Capitis oxacillin resistant 6/13/  sputum culture revealing Pseudomonas plus Klebsiella   ID: .... Antibiotics was initially discontinued, restarted 10/07/2020 Zosyn per pharmacy dosing    Long-term prognosis is extremely poor due to underlying anoxic brain injury and poor functional status, will continue supportive care and monitor.  Family wants aggressive measures for now.  Note at baseline patient wears a trach collar with 6 L per minute, currently on 10 L. VQ scan ordered upon admission negative for pulmonary embolism Echo noted with no right-sided strain preserved EF of 60%.    Family remain unrealistic regarding goals of care, CODE STATUS-ethics committee has been called_      SpO2: 95 % O2 Flow Rate (L/min): 10 L/min FiO2 (%): 40 %  2.  Anoxic brain injury due to PEA  arrest in the past (August 2021 - asthma attack induced)  -Remains encephalopathic -   Trached and pegged at baseline.  Minimal to no response to verbal stimuli only responds to painful stimuli.  Poor functional status with history of seizures.  Continue supportive care with Keppra.  Poor prognosis.   Previously detailed discussion with daughter was not fruitful-family remains unrealistic regarding goals of care and CODE STATUS -Ethics committee has been called  Tried calling daughter on did not answer Extensive discussion with Dr. Thedore Mins and patient's daughter: On 10/04/2020 -remain unrealistic regarding goals of care and CODE STATUS  Prognosis-remains very poor   3.  Chronic diastolic CHF EF 60% on echocardiogram in December 2021.   -Mainly wheezing with rhonchi, minimal crackles IV fluid has been stopped Stable   4.  IV PPI.... To be continued in the face of his steroids  5.  History of seizures with anoxic brain injury.  On Keppra.... Patient remains unresponsive  6.  COPD/ Asthma  -Remains in acute respiratory failure -remains acute on chronic respiratory failure, on 10 L of oxygen via trach satting 98%  supportive Rx. Steroids per PCCM.  7. DM type II.  On sliding scale and Lantus.  Feeding via PEG tube -adjusting sliding scale to every 4 hours  Lab Results  Component Value Date   HGBA1C 7.0 (H) 2020-10-19   CBG (last 3)  Recent Labs    10/08/20 0314 10/08/20 0714 10/08/20 1212  GLUCAP 257* 168* 240*     Nutrition Problem: Nutrition Problem: Increased nutrient needs Etiology: wound healing Signs/Symptoms: estimated needs Interventions: Tube feeding  Obesity: Estimated body mass index is 35.89 kg/m as calculated from the following:   Height as of this encounter: 5\' 2"  (1.575 m).   Weight as of this encounter: 89 kg.            Condition - Extremely Guarded  Family Communication  : Daughter [phone] on 10/02/20, 10/04/20,  tried calling  09/04/2020 no answer  Code Status :  Full  Consults  :  PCCM, Neuro, ID  PUD Prophylaxis : PPI   Procedures  :      VQ -reviewed no signs or suggestion of acute PE  TTE - 1. Left ventricular ejection fraction, by estimation, is 70 to 75%. The left ventricle has hyperdynamic function. The left ventricle has no regional wall motion abnormalities. There is moderate left ventricular hypertrophy. Left ventricular diastolic parameters are consistent with Grade I diastolic dysfunction (impaired relaxation).  2. Right ventricular systolic function is normal. The right ventricular size is normal.  3. The mitral valve is normal in structure. No evidence of mitral valve regurgitation. No evidence of mitral stenosis.  4. The aortic valve has an indeterminant number of cusps. Aortic valve regurgitation is not visualized. No aortic stenosis is present.  5. The inferior vena cava is normal in size with greater than 50% respiratory variability, suggesting right atrial pressure of 3 mmHg.      Disposition Plan  :    Status is: Inpatient  Remains inpatient appropriate because:IV treatments appropriate due to intensity of illness or inability to take PO  Dispo: The patient is from: SNF              Anticipated d/c is to: SNF              Patient currently is not medically stable to d/c.   Difficult to place patient No  DVT Prophylaxis  :  Lovenox     Lab Results  Component Value Date   PLT 394 10/08/2020    Diet :  Diet Order             Diet NPO time specified  Diet effective now                    Inpatient Medications  Scheduled Meds:  budesonide  0.5 mg Nebulization BID   chlorhexidine  15 mL Mouth/Throat BID   Chlorhexidine Gluconate Cloth  6 each Topical Daily   clonazePAM  0.5 mg Per Tube BID   clopidogrel  75 mg Per Tube Daily   docusate  100 mg Per Tube BID   enoxaparin (LOVENOX) injection  0.5 mg/kg Subcutaneous Q24H   famotidine  20 mg Per Tube BID   feeding  supplement (PROSource TF)  45 mL Per Tube BID   free water  150 mL Per Tube Q4H   furosemide  40 mg Intravenous Q12H   insulin aspart  0-15 Units Subcutaneous Q4H   insulin aspart  3 Units Subcutaneous Q4H   insulin glargine  40 Units Subcutaneous QHS   ipratropium-albuterol  3 mL Nebulization Q4H   levETIRAcetam  2,500 mg Per Tube BID   loratadine  10 mg Per Tube Daily   mouth rinse  15 mL Mouth Rinse 10 times per day   metoprolol tartrate  25 mg Per Tube BID   montelukast  10 mg Per Tube Daily   mupirocin ointment  1 application Nasal BID   polyethylene glycol  17 g Per Tube Daily   potassium chloride  20 mEq Per Tube Daily   senna-docusate  1 tablet Per Tube q AM   Continuous Infusions:  feeding supplement (JEVITY 1.5 CAL/FIBER) 1,000 mL (10/07/20 2300)   sodium phosphate  Dextrose 5% IVPB 43 mL/hr at 10/08/20 1200   vancomycin Stopped (10/07/20 1548)   PRN Meds:.albuterol, fentaNYL (SUBLIMAZE) injection, fentaNYL (SUBLIMAZE) injection, hydrALAZINE  Antibiotics  :    Anti-infectives (From admission, onward)  Start     Dose/Rate Route Frequency Ordered Stop   10/07/20 1400  vancomycin (VANCOREADY) IVPB 1500 mg/300 mL        1,500 mg 150 mL/hr over 120 Minutes Intravenous Every 24 hours 10/07/20 1223     10/07/20 1200  ceFEPIme (MAXIPIME) 2 g in sodium chloride 0.9 % 100 mL IVPB  Status:  Discontinued        2 g 200 mL/hr over 30 Minutes Intravenous Every 8 hours 10/07/20 0828 10/07/20 1221   10/02/20 1400  vancomycin (VANCOREADY) IVPB 1250 mg/250 mL  Status:  Discontinued        1,250 mg 166.7 mL/hr over 90 Minutes Intravenous Every 24 hours 10-08-2020 1313 10/04/20 1221   10-08-2020 2200  ceFEPIme (MAXIPIME) 2 g in sodium chloride 0.9 % 100 mL IVPB  Status:  Discontinued        2 g 200 mL/hr over 30 Minutes Intravenous Every 8 hours 10/08/2020 1313 10/04/20 1221   2020-10-08 2200  metroNIDAZOLE (FLAGYL) IVPB 500 mg  Status:  Discontinued        500 mg 100 mL/hr over 60 Minutes  Intravenous Every 8 hours 2020/10/08 1616 10/04/20 1221   10/08/2020 1330  vancomycin (VANCOREADY) IVPB 2000 mg/400 mL        2,000 mg 200 mL/hr over 120 Minutes Intravenous  Once 08-Oct-2020 1257 2020-10-08 1550   10-08-2020 1300  ceFEPIme (MAXIPIME) 2 g in sodium chloride 0.9 % 100 mL IVPB        2 g 200 mL/hr over 30 Minutes Intravenous  Once 10/08/20 1257 10-08-2020 1330   10/08/2020 1300  metroNIDAZOLE (FLAGYL) IVPB 500 mg        500 mg 100 mL/hr over 60 Minutes Intravenous  Once October 08, 2020 1257 October 08, 2020 1443       See all Orders from today for further details    Objective:   Vitals:   10/08/20 1130 10/08/20 1200 10/08/20 1230 10/08/20 1300  BP: 118/82 120/86 (!) 137/94 (!) 148/95  Pulse: 94 94 97 (!) 102  Resp: (!) 29  Temp:      TempSrc:      SpO2: 98% 99% 98% 95%  Weight:      Height:        Wt Readings from Last 3 Encounters:  10/08/20 89 kg  09/08/19 96.6 kg  08/30/19 99.7 kg     Intake/Output Summary (Last 24 hours) at 10/08/2020 1326 Last data filed at 10/08/2020 1200 Gross per 24 hour  Intake 2374.1 ml  Output 850 ml  Net 1524.1 ml         Physical Exam: No changes today exam remain unresponsive General:  Unresponsive on the vent via trach  HEENT:  Tracheostomy patent, connected to vent, patient is unresponsive  Neuro:  Limited exam unresponsive  Lungs:   Labored breathing, on event, upper lobe wheezing, mild lower lobe crackles, positive air entry  Cardio:    S1/S2, RRR, No murmure, No Rubs or Gallops   Abdomen:   Soft, non-tender, bowel sounds active all four quadrants,  no guarding or peritoneal signs.  Muscular skeletal:  Limited exam - in bed, 2+ pulses,  symmetric,+1 pitting edema  Skin:  Dry, warm to touch,   Wounds: Please see nursing documentation  Pressure Injury 10/02/20 Buttocks Bilateral Stage 2 -  Partial thickness loss of dermis presenting as a shallow open injury with a red, pink wound bed without slough. (Active)  10/02/20 0300   Location: Buttocks  Location Orientation: Bilateral  Staging: Stage 2 -  Partial thickness loss of dermis presenting as a shallow open injury with a red, pink wound bed without slough.  Wound Description (Comments):   Present on Admission: Yes               Data Review:    CBC Recent Labs  Lab 10/03/20 0654 10/04/20 0051 10/05/20 0049 10/05/20 1802 10/06/20 0548 10/07/20 0106 10/08/20 0610  WBC 9.5 10.9* 11.0*  --  16.9* 13.4* 19.0*  HGB 9.2* 9.4* 10.4* 12.9 12.0 10.3* 12.0  HCT 30.9* 30.3* 33.8* 38.0 39.9 33.3* 38.0  PLT 279 262 308  --  288 297 394  MCV 88.0 87.1 85.8  --  88.3 86.3 85.6  MCH 26.2 27.0 26.4  --  26.5 26.7 27.0  MCHC 29.8* 31.0 30.8  --  30.1 30.9 31.6  RDW 15.2 15.3 15.2  --  15.8* 15.8* 15.9*  LYMPHSABS 0.9 0.9 1.2  --  1.9  --  3.4  MONOABS 0.4 0.4 0.6  --  1.4*  --  1.4*  EOSABS 0.0 0.0 0.0  --  0.0  --  0.1  BASOSABS 0.0 0.0 0.0  --  0.1  --  0.1    Recent Labs  Lab 10/12/2020 1532 10/07/2020 1618 09/21/2020 1831 10/02/20 0758 10/03/20 0654 10/04/20 0051 10/04/20 0734 10/05/20 0049 10/05/20 1802 10/06/20 0548 10/07/20 0106 10/08/20 0610  NA  --    < >  --  139 138 141  --  139 141 139 141 136  K  --    < >  --  3.5 4.2 4.3  --  4.2 4.1 4.2 4.1 4.4  CL  --    < >  --  104 108 110  --  105  --  101 103 103  CO2  --    < >  --  28 23 25   --  28  --  24 31 25   GLUCOSE  --    < >  --  91 241* 235*  --  257*  --  328* 214* 184*  BUN  --    < >  --  9 14 14   --  15  --  16 20 15   CREATININE  --    < > 0.43* 0.45 0.43* 0.49  --  0.48  --  0.53 0.41* 0.38*  CALCIUM  --    < >  --  10.1 10.1 10.2  --  10.4*  --  11.2* 10.6* 10.7*  AST  --    < >  --  18 20 17   --  21  --  19  --  18  ALT  --    < >  --  21 25 26   --  28  --  33  --  29  ALKPHOS  --    < >  --  94 88 79  --  76  --  92  --  83  BILITOT  --    < >  --  0.3 0.4 0.2*  --  0.2*  --  0.6  --  0.6  ALBUMIN  --    < >  --  2.6* 2.6* 2.5*  --  2.7*  --  3.1*  --  3.0*  MG  --    < >   --  1.7 2.0 2.0  --  2.0  --  2.1 1.9  --  CRP  --   --   --  6.9* 2.8* 0.8  --  0.6  --  0.8  --   --   DDIMER  --   --   --   --   --   --  0.88* 0.80*  --  0.75*  --   --   PROCALCITON  --    < >  --  <0.10 <0.10 <0.10  --  <0.10  --  <0.10 <0.10  --   LATICACIDVEN 2.1*  --   --   --   --   --   --   --   --   --   --   --   HGBA1C  --   --  7.0*  --   --   --   --   --   --   --   --   --   BNP  --    < >  --  63.9 104.1* 145.8*  --  115.3*  --  125.4*  --   --    < > = values in this interval not displayed.    ------------------------------------------------------------------------------------------------------------------ No results for input(s): CHOL, HDL, LDLCALC, TRIG, CHOLHDL, LDLDIRECT in the last 72 hours.  Lab Results  Component Value Date   HGBA1C 7.0 (H) 09/24/2020   ------------------------------------------------------------------------------------------------------------------ No results for input(s): TSH, T4TOTAL, T3FREE, THYROIDAB in the last 72 hours.  Invalid input(s): FREET3  Cardiac Enzymes No results for input(s): CKMB, TROPONINI, MYOGLOBIN in the last 168 hours.  Invalid input(s): CK ------------------------------------------------------------------------------------------------------------------    Component Value Date/Time   BNP 125.4 (H) 10/06/2020 0548    Micro Results   Radiology Reports NM Pulmonary Perfusion  Result Date: 10/04/2020 CLINICAL DATA:  Respiratory failure. EXAM: NUCLEAR MEDICINE PERFUSION LUNG SCAN TECHNIQUE: Perfusion images were obtained in multiple projections after intravenous injection of radiopharmaceutical. Ventilation scans intentionally deferred if perfusion scan and chest x-ray adequate for interpretation during COVID 19 epidemic. RADIOPHARMACEUTICALS:  4.3 mCi Tc-49m MAA IV COMPARISON:  Aug 28, 2019. FINDINGS: No wedge shaped peripheral perfusion defects to suggest acute pulmonary embolism. IMPRESSION: Normal.  Electronically Signed   By: Obie Dredge M.D.   On: 10/04/2020 15:10   DG CHEST PORT 1 VIEW  Result Date: 10/08/2020 CLINICAL DATA:  Tracheostomy placement EXAM: PORTABLE CHEST 1 VIEW COMPARISON:  10/07/2020 FINDINGS: Tracheostomy tube tip is at the level of the clavicular heads. Unchanged left pleural effusion and retrocardiac opacities. Right lung is clear. Cardiomediastinal contours are normal. IMPRESSION: Tracheostomy tube tip at the level of the clavicular heads. Electronically Signed   By: Deatra Robinson M.D.   On: 10/08/2020 00:37   DG Chest Port 1 View  Result Date: 10/07/2020 CLINICAL DATA:  Patient with respiratory failure. EXAM: PORTABLE CHEST 1 VIEW COMPARISON:  Chest radiograph 10/06/2020. FINDINGS: Monitoring leads overlie the patient. Tracheostomy tube terminates over the mid trachea. Patient is markedly rotated limiting evaluation. Stable cardiac and mediastinal contours. Bibasilar heterogeneous opacities. No definite pleural effusion or pneumothorax. IMPRESSION: Patient is rotated limiting evaluation. Redemonstrated bibasilar opacities which may represent atelectasis, poorly evaluated due to patient rotation. Electronically Signed   By: Annia Belt M.D.   On: 10/07/2020 09:06   DG CHEST PORT 1 VIEW  Result Date: 10/06/2020 CLINICAL DATA:  Hypoxia EXAM: PORTABLE CHEST 1 VIEW COMPARISON:  October 05, 2020 FINDINGS: Tracheostomy catheter tip is 5.6 cm above carina. No pneumothorax. Consolidation within the left mid lung is stable. The lungs elsewhere are clear.  Heart is upper normal in size with pulmonary vascularity normal. No adenopathy. Scoliosis is stable. IMPRESSION: Tracheostomy as noted without pneumothorax. Airspace opacity left mid lung, stable. No new opacity. Heart upper normal in size. Electronically Signed   By: Bretta BangWilliam  Woodruff III M.D.   On: 10/06/2020 07:53   DG Chest Port 1 View  Result Date: 10/05/2020 CLINICAL DATA:  Acute respiratory distress. EXAM: PORTABLE CHEST 1  VIEW COMPARISON:  10/04/2020 FINDINGS: Tracheostomy tube overlies the airway. The cardiomediastinal silhouette is unchanged with normal heart size. There is new, partially platelike opacity in the left mid lung most suggestive atelectasis. Minimal left basilar opacity is less prominent than on the prior study and also likely reflects atelectasis. The right lung is clear. No sizable pleural effusion or pneumothorax is identified. IMPRESSION: New left midlung atelectasis. Electronically Signed   By: Sebastian AcheAllen  Grady M.D.   On: 10/05/2020 16:05   DG Chest Port 1 View  Result Date: 10/04/2020 CLINICAL DATA:  Shortness of breath, trach. EXAM: PORTABLE CHEST 1 VIEW COMPARISON:  Prior chest radiograph 10/02/2020 and earlier. FINDINGS: Unchanged position of a tracheostomy tube with tip projecting just below the level of the clavicular heads. The cardiomediastinal silhouette is unchanged. Evaluation of the left lung base is limited due to superimposition of soft tissues. However, there is suspected opacity within the left lung base. The right lung is clear. Superimposition of soft tissues limits evaluation for left-sided pleural effusion. No right pleural effusion. No appreciable pneumothorax. No acute bony abnormality. IMPRESSION: Superimposition of soft tissues limits evaluation of the left lung base. However, there is suspected opacity within the left lung base, which may reflect atelectasis or pneumonia. A repeat examination following patient repositioning may be helpful for further characterization. Electronically Signed   By: Jackey LogeKyle  Golden DO   On: 10/04/2020 11:15   DG CHEST PORT 1 VIEW  Result Date: 10/02/2020 CLINICAL DATA:  Respiratory distress EXAM: PORTABLE CHEST 1 VIEW COMPARISON:  Film from earlier in the same day FINDINGS: Tracheostomy tube is noted in satisfactory position. Cardiac shadow is unremarkable. The lungs are clear bilaterally. No bony abnormality is seen. IMPRESSION: Acute abnormality noted.  Electronically Signed   By: Alcide CleverMark  Lukens M.D.   On: 10/02/2020 16:05   DG Chest Port 1 View  Result Date: 10/02/2020 CLINICAL DATA:  Shortness of breath EXAM: PORTABLE CHEST 1 VIEW COMPARISON:  October 01, 2020 FINDINGS: Evaluation is limited secondary to patient rotation. The cardiomediastinal silhouette is unchanged in contour.Tracheostomy. No pleural effusion. No pneumothorax. LEFT retrocardiac opacity, likely atelectasis. Visualized abdomen is unremarkable. Multilevel degenerative changes of the thoracic spine. IMPRESSION: No acute cardiopulmonary abnormality. Electronically Signed   By: Meda KlinefelterStephanie  Peacock MD   On: 10/02/2020 07:50   DG Chest Port 1 View  Result Date: Sep 06, 2020 CLINICAL DATA:  Possible sepsis.  Vomiting.  Hypoxia. EXAM: PORTABLE CHEST 1 VIEW COMPARISON:  01/17/2020 FINDINGS: Tracheostomy tube in adequate position. Lungs are adequately inflated without focal airspace consolidation or effusion. Cardiomediastinal silhouette and remainder of the exam is unchanged. IMPRESSION: No acute cardiopulmonary disease. Electronically Signed   By: Elberta Fortisaniel  Boyle M.D.   On: 0May 18, 2022 10:45   DG Abd Portable 1V  Result Date: 10/02/2020 CLINICAL DATA:  Nausea. EXAM: PORTABLE ABDOMEN - 1 VIEW COMPARISON:  12/25/2017 FINDINGS: Normal bowel gas pattern. No abnormal stool retention. Percutaneous gastrostomy tube in place. No concerning mass effect or calcification. Left hip arthroplasty. Lumbar spine degeneration and scoliosis IMPRESSION: Normal bowel gas pattern. Electronically Signed   By: Marja KaysJonathon  Watts M.D.   On: 10/02/2020 09:16   ECHOCARDIOGRAM COMPLETE  Result Date: 10/03/2020    ECHOCARDIOGRAM REPORT   Patient Name:   JAZZMAN LOUGHMILLER Date of Exam: 10/03/2020 Medical Rec #:  [id number]      Height:       62.0 in Accession #:    [id number]     Weight:       202.2 lb Date of Birth:  1955/11/19      BSA:          1.920 m Patient Age:    64 years       BP:           143/82 mmHg Patient Gender: F               HR:           86 bpm. Exam Location:  Inpatient Procedure: 2D Echo, Cardiac Doppler and Color Doppler Indications:    Bacteremia R78.81  History:        Patient has prior history of Echocardiogram examinations, most                 recent 04/04/2020. Risk Factors:Morbid obesity. Tracheostomy.                 Feeding tube.  Sonographer:    Roosvelt Maser RDCS Referring Phys: [id number] LAURA P CLARK IMPRESSIONS  1. Left ventricular ejection fraction, by estimation, is 70 to 75%. The left ventricle has hyperdynamic function. The left ventricle has no regional wall motion abnormalities. There is moderate left ventricular hypertrophy. Left ventricular diastolic parameters are consistent with Grade I diastolic dysfunction (impaired relaxation).  2. Right ventricular systolic function is normal. The right ventricular size is normal.  3. The mitral valve is normal in structure. No evidence of mitral valve regurgitation. No evidence of mitral stenosis.  4. The aortic valve has an indeterminant number of cusps. Aortic valve regurgitation is not visualized. No aortic stenosis is present.  5. The inferior vena cava is normal in size with greater than 50% respiratory variability, suggesting right atrial pressure of 3 mmHg. FINDINGS  Left Ventricle: Left ventricular ejection fraction, by estimation, is 70 to 75%. The left ventricle has hyperdynamic function. The left ventricle has no regional wall motion abnormalities. The left ventricular internal cavity size was normal in size. There is moderate left ventricular hypertrophy. Left ventricular diastolic parameters are consistent with Grade I diastolic dysfunction (impaired relaxation). Right Ventricle: The right ventricular size is normal.Right ventricular systolic function is normal. Left Atrium: Left atrial size was normal in size. Right Atrium: Right atrial size was normal in size. Pericardium: There is no evidence of pericardial effusion. Mitral Valve: The mitral valve is  normal in structure. No evidence of mitral valve regurgitation. No evidence of mitral valve stenosis. Tricuspid Valve: The tricuspid valve is normal in structure. Tricuspid valve regurgitation is not demonstrated. No evidence of tricuspid stenosis. Aortic Valve: The aortic valve has an indeterminant number of cusps. Aortic valve regurgitation is not visualized. No aortic stenosis is present. Aortic valve mean gradient measures 9.0 mmHg. Aortic valve peak gradient measures 15.5 mmHg. Aortic valve area, by VTI measures 1.64 cm. Pulmonic Valve: The pulmonic valve was normal in structure. Pulmonic valve regurgitation is not visualized. No evidence of pulmonic stenosis. Aorta: The aortic root is normal in size and structure. Venous: The inferior vena cava is normal in size with greater than 50% respiratory variability, suggesting right atrial pressure of 3 mmHg.  LEFT VENTRICLE PLAX 2D LVIDd:         3.30 cm  Diastology LVIDs:         2.40 cm  LV e' medial:    5.87 cm/s LV PW:         1.40 cm  LV E/e' medial:  13.4 LV IVS:        1.00 cm  LV e' lateral:   8.92 cm/s LVOT diam:     1.90 cm  LV E/e' lateral: 8.8 LV SV:         55 LV SV Index:   29 LVOT Area:     2.84 cm  RIGHT VENTRICLE          IVC RV Basal diam:  3.60 cm  IVC diam: 2.00 cm LEFT ATRIUM           Index       RIGHT ATRIUM           Index LA diam:      3.50 cm 1.82 cm/m  RA Area:     12.00 cm LA Vol (A4C): 56.9 ml 29.63 ml/m RA Volume:   28.00 ml  14.58 ml/m  AORTIC VALVE AV Area (Vmax):    1.42 cm AV Area (Vmean):   1.30 cm AV Area (VTI):     1.64 cm AV Vmax:           197.00 cm/s AV Vmean:          143.000 cm/s AV VTI:            0.338 m AV Peak Grad:      15.5 mmHg AV Mean Grad:      9.0 mmHg LVOT Vmax:         98.90 cm/s LVOT Vmean:        65.600 cm/s LVOT VTI:          0.195 m LVOT/AV VTI ratio: 0.58  AORTA Ao Root diam: 2.70 cm Ao Asc diam:  2.80 cm MITRAL VALVE MV Area (PHT): 4.49 cm     SHUNTS MV Decel Time: 169 msec     Systemic VTI:  0.20  m MV E velocity: 78.50 cm/s   Systemic Diam: 1.90 cm MV A velocity: 117.00 cm/s MV E/A ratio:  0.67 Olga Millers MD Electronically signed by Olga Millers MD Signature Date/Time: 10/03/2020/5:16:22 PM    Final        Time Spent in minutes 55 minutes of critical care time was spent seeing examining patient, evaluating labs, reviewing medical records,   Kendell Bane M.D on 10/06/2020 at 10:59 AM  To page go to [email or web address]   [facility] -  Office  [phone]

## 2020-10-08 NOTE — Progress Notes (Signed)
eLink Physician-Brief Progress Note Patient Name: Sonia [facility] DOB: [birth year] MRN: [id number]   Date of Service  10/08/2020  HPI/Events of Note  Notified of tachycardia 140s. BP 136/95 Given hydralazine for hypertension Also given Fentanyl and clonazepam  Patient on metoprolol previously  eICU Interventions  Resume metoprolol 25 BID     Intervention Category Major Interventions: Arrhythmia - evaluation and management  Darl Pikes 10/08/2020, 1:43 AM

## 2020-10-08 NOTE — Progress Notes (Signed)
RT placed patient on 40% 10L ATC per MD. Patient tolerating well at this time. RN aware. RT will continue to monitor.

## 2020-10-08 NOTE — Progress Notes (Signed)
NAME:  Sonia [facility], MRN:  [id number], DOB:  07/27/1955, LOS: 7 ADMISSION DATE:  2020/10/23, CONSULTATION DATE:  10/08/2020 REFERRING MD:  Sonia Peer, MD, CHIEF COMPLAINT:  respiratory failure, vent management   History of Present Illness:  [facility] is a 65 year old woman with history of cardiac arrest with anoxic brain injury status post tracheostomy in August 2021 who presents for new patient evaluation for [facility] for worsening respiratory failure.  She is on trach collar 6 L with humidification daily.  She was discharged to [facility] initially and then transferred to skilled nursing facility after ventilator liberation.  At baseline she has bedbound, nonverbal.  She had a coughing spell earlier today at [facility] and possible aspiration.  There were copious secretions suctioned from her tracheostomy.  She was transferred to [facility], ED for further evaluation.  Here she is found to be tachycardic, tachypneic, and having worsening hypoxemia.  Chest x-ray is obtained and shows no acute cardiopulmonary process.  She has been evaluated for sepsis and being ruled out for infection.  Pulmonary's been consulted to assist with respiratory failure and trach management in the setting of sepsis.  Pertinent  Medical History  COPD Seizure disorder Type 2 DM  Significant Hospital Events: Including procedures, antibiotic start and stop dates in addition to other pertinent events   6/12 admitted from skilled nursing facility after aspiration event and copious secretions per tracheostomy 6/13 appears comfortable currently on 35% ATC. No distress. WBC ct trending down. No events overnight growing GPC clusters in two of two cultures  Resp cx 6/13 >> pseudomonas + klebsiella (? Colonizers) Blood 6/12 >> 2 of 2 S. Epi + S. Capitis oxacillin resistant 6/16 increased work of breathing, upper airway noise, abdominal muscle use.  Bronchoscopy performed as trach exchange cuffless  #8 Shiley XLT showed dynamic airway collapse, tracheal collapse. 6/16 continued respiratory distress moved to ICU, changed to cuffed #6 Shiley XLT, placed on MV with some improved comfort Repeat blood cx 6/18 >> Restarted abx 6/18 Interim History / Subjective:   I/O + 8.9 L No PSV yesterday Abx restarted 6/18, cefepime + vanco   Objective   Blood pressure (!) 141/93, pulse (!) 127, temperature 98.3 F (36.8 C), temperature source Oral, resp. rate (!) 26, height 5\' 2"  (1.575 m), weight 89 kg, SpO2 98 %.    Vent Mode: PRVC FiO2 (%):  [40 %] 40 % Set Rate:  [16 bmp] 16 bmp Vt Set:  [400 mL] 400 mL PEEP:  [5 cmH20] 5 cmH20 Plateau Pressure:  [11 cmH20-21 cmH20] 12 cmH20   Intake/Output Summary (Last 24 hours) at 10/08/2020 0721 Last data filed at 10/08/2020 10/10/2020 Gross per 24 hour  Intake 2619.52 ml  Output 550 ml  Net 2069.52 ml   Filed Weights   10/05/20 1700 10/07/20 0307 10/08/20 0158  Weight: 88.9 kg 87.1 kg 89 kg    Examination: General: Ill-appearing woman, in bed, ventilated HEENT: Pupils equal, no oral secretions, mild upper airway noise on inspiration and expiration Neck: Cuffed #6 proximal XLT Shiley.  No secretions, stoma looks good Neuro: Completely unresponsive.  He has had some spontaneous eye opening.  Does not follow commands, does not track.  Weak cough CV: Distant, regular, no murmur PULM: Decreased both bases, some referred upper airway inspiratory and expiratory noise GI: Nondistended, PEG in place, positive bowel sounds Extremities: No edema Skin: No rash  Labs/imaging that I havepersonally reviewed  (right click and "Reselect all SmartList Selections" daily)  All labs and studies reviewed 6/19  Resolved Hospital Problem list     Assessment & Plan:   [facility] is a 65 y.o. woman with history of chronic respiratory failure s/p tracheostomy in 12/2019 at [facility] after PEA arrest with anoxic brain injury. She is here from SNF for worsening  shortness of breath and possible aspiration event.  Pseudomonas and Klebsiella on respiratory culture.  To the ICU 6/16 with increased work of breathing, dynamic upper airway and tracheal collapse noted on bronchoscopy at time of trach change.  Now on MV and tolerating PSV 8.  Staph species bacteremia 2 of 2, follow-up cultures pending.  Empiric Zosyn.   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  She has some left midlung atelectasis on chest x-ray, has completed short course antibiotics, procalcitonin reassuring -Still with some stable upper airway noise in the setting of dynamic tracheal collapse.  Believe we can try to push for PSV, ATC now that we have stabilized her medically. -Continue bronchodilators and Pulmicort -Plan to stop steroids today 6/19.  I do not believe this is bronchospasm.  More consistent with her large airway collapse which will likely be chronic -Empiric diuretics as renal function and hemodynamics will tolerate, Lasix given 6/19 -Suspect Pseudomonas and Klebsiella are respiratory colonizers -Follow chest x-ray -Hopefully we will be able to wean from MV.  If not then she may require long-term ventilator support which means her disposition options would change significantly.  She would require vent-SNF  Staph bacteremia, 2 of 2 cx's. S capitis is oxacillin resistant.  -Continue to follow surveillance blood cultures -Plan to continue antibiotics cefepime and vancomycin pending culture data -No vegetation by TTE 6/14, question role for TEE.  Will require if cultures remain positive  Chronic encephalopathy post prior cardiac arrest History of seizures -Continue Keppra -Minimizing sedating medications -Little chance for any neurological  recovery.  She has been facility dependent.  Diabetes mellitus with hyperglycemia -Lantus 40 units -Sliding scale insulin as ordered -Tube feeding coverage as ordered  Hypertension with chronic diastolic CHF -Covering with hydralazine as needed -Add back maintenance antihypertensive regimen if indicated  Hypophosphatemia -replace 6/19  Chronic rhinitis -Singulair and loratadine  Independent CC time 31 minutes   Levy Pupa, MD, PhD 10/08/2020, 7:21 AM [facility] [phone] or if no answer before 7:00PM call [phone] For any issues after 7:00PM please call eLink [phone]

## 2020-10-09 DIAGNOSIS — A419 Sepsis, unspecified organism: Secondary | ICD-10-CM | POA: Diagnosis not present

## 2020-10-09 DIAGNOSIS — R0603 Acute respiratory distress: Secondary | ICD-10-CM | POA: Diagnosis not present

## 2020-10-09 DIAGNOSIS — J9621 Acute and chronic respiratory failure with hypoxia: Secondary | ICD-10-CM | POA: Diagnosis not present

## 2020-10-09 LAB — COMPREHENSIVE METABOLIC PANEL
ALT: 27 U/L (ref 0–44)
AST: 16 U/L (ref 15–41)
Albumin: 3 g/dL — ABNORMAL LOW (ref 3.5–5.0)
Alkaline Phosphatase: 81 U/L (ref 38–126)
Anion gap: 9 (ref 5–15)
BUN: 17 mg/dL (ref 8–23)
CO2: 29 mmol/L (ref 22–32)
Calcium: 10.4 mg/dL — ABNORMAL HIGH (ref 8.9–10.3)
Chloride: 98 mmol/L (ref 98–111)
Creatinine, Ser: 0.41 mg/dL — ABNORMAL LOW (ref 0.44–1.00)
GFR, Estimated: >60 mL/min (ref >60)
Glucose, Bld: 144 mg/dL — ABNORMAL HIGH (ref 70–99)
Potassium: 3.8 mmol/L (ref 3.5–5.1)
Sodium: 136 mmol/L (ref 135–145)
Total Bilirubin: 0.6 mg/dL (ref 0.3–1.2)
Total Protein: 6.2 g/dL — ABNORMAL LOW (ref 6.5–8.1)

## 2020-10-09 LAB — CBC WITH DIFFERENTIAL/PLATELET
Abs Immature Granulocytes: 0.33 10*3/uL — ABNORMAL HIGH (ref 0.00–0.07)
Basophils Absolute: 0.1 10*3/uL (ref 0.0–0.1)
Basophils Relative: 0 %
Eosinophils Absolute: 0.4 10*3/uL (ref 0.0–0.5)
Eosinophils Relative: 2 %
HCT: 38.1 % (ref 36.0–46.0)
Hemoglobin: 12 g/dL (ref 12.0–15.0)
Immature Granulocytes: 2 %
Lymphocytes Relative: 26 %
Lymphs Abs: 4 10*3/uL (ref 0.7–4.0)
MCH: 26.6 pg (ref 26.0–34.0)
MCHC: 31.5 g/dL (ref 30.0–36.0)
MCV: 84.5 fL (ref 80.0–100.0)
Monocytes Absolute: 1.2 10*3/uL — ABNORMAL HIGH (ref 0.1–1.0)
Monocytes Relative: 8 %
Neutro Abs: 9.4 10*3/uL — ABNORMAL HIGH (ref 1.7–7.7)
Neutrophils Relative %: 62 %
Platelets: 344 10*3/uL (ref 150–400)
RBC: 4.51 MIL/uL (ref 3.87–5.11)
RDW: 16.1 % — ABNORMAL HIGH (ref 11.5–15.5)
WBC: 15.4 10*3/uL — ABNORMAL HIGH (ref 4.0–10.5)
nRBC: 0 % (ref 0.0–0.2)

## 2020-10-09 LAB — GLUCOSE, CAPILLARY
Glucose-Capillary: 159 mg/dL — ABNORMAL HIGH (ref 70–99)
Glucose-Capillary: 159 mg/dL — ABNORMAL HIGH (ref 70–99)
Glucose-Capillary: 180 mg/dL — ABNORMAL HIGH (ref 70–99)
Glucose-Capillary: 147 mg/dL — ABNORMAL HIGH (ref 70–99)
Glucose-Capillary: 185 mg/dL — ABNORMAL HIGH (ref 70–99)
Glucose-Capillary: 183 mg/dL — ABNORMAL HIGH (ref 70–99)

## 2020-10-09 MED ORDER — FREE WATER
150.0000 mL | Freq: Two times a day (BID) | Status: AC
  Administered 2020-10-09: 150 mL

## 2020-10-09 NOTE — Progress Notes (Signed)
NAME:  Sonia [facility], MRN:  [id number], DOB:  [birth year], LOS: 8 ADMISSION DATE:  09/23/2020, CONSULTATION DATE:  6/15 REFERRING MD:  Thedore Mins, CHIEF COMPLAINT:  Dyspnea   History of Present Illness:  65 y/o female with anoxic brain injury, tracheostomy status who was admitted from a SNF on 6/12 with worsening respiratory failure.  She had previously been ventilator dependent but had been weaned form the trach.  On 6/16 a bronchoscopy revealed dynamic collapse of her trachea, tracheostomy was replaced.  Later required mechanical ventilation.   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 No distress. WBC ct trending down. No events overnight growing GPC clusters in two of two cultures  6/16 increased work of breathing, upper airway noise, abdominal muscle use.  Bronchoscopy performed as trach exchange cuffless #8 Shiley XLT showed dynamic airway collapse, tracheal collapse. 6/16 continued respiratory distress moved to ICU, changed to cuffed #6 Shiley XLT, placed on MV with some improved comfort 6/12 cefepime > 6/14, 6/18 6/12 vanc > 6/14, 6/18   Interim History / Subjective:  No acute events Trach collar 2 hours yesterday  Objective   Blood pressure 121/90, pulse (!) 104, temperature 97.6 F (36.4 C), temperature source Oral, resp. rate 17, height 5\' 2"  (1.575 m), weight 88.9 kg, SpO2 97 %.    Vent Mode: PRVC FiO2 (%):  [40 %] 40 % Set Rate:  [16 bmp] 16 bmp Vt Set:  [400 mL] 400 mL PEEP:  [5 cmH20] 5 cmH20 Pressure Support:  [8  cmH20] 8 cmH20 Plateau Pressure:  [10 cmH20-17 cmH20] 10 cmH20   Intake/Output Summary (Last 24 hours) at 10/09/2020 0740 Last data filed at 10/09/2020 0700 Gross per 24 hour  Intake 2705.05 ml  Output 3400 ml  Net -694.95 ml   Filed Weights   10/07/20 0307 10/08/20 0158 10/09/20 0206  Weight: 87.1 kg 89 kg 88.9 kg    Examination: General:  In bed on vent HENT: NCAT tracheostomy in place PULM: Prolonged exp wheeze, vent supported breathing CV: RRR, no mgr GI: BS+, soft, nontender MSK: diminished bulk, decerebrate posturing Neuro: opens eyes to voice, no motor movement   Labs/imaging that I havepersonally reviewed  (right click and "Reselect all SmartList Selections" daily)  6/19 CXR images personally reviewed> no infiltrate, tracheostomy in place Wbc 15 Resp cx 6/13 >> pseudomonas + klebsiella (? Colonizers) Blood 6/12 >> 2 of 2 S. Epi + S. 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 Continue to wean vent with pressure support Pulm toilette regularly Stop antibiotics Continue pulmicort Continue duoneb scheduled  Staph bacteremia 2/2 cultures (contaminant per ID), monitoring surveillance cultures Holding antibiotics for now Monitor 6/18 cultures  Hypernatremia> improved Decrease free water to 7/18 q12h  Chronic anoxic encephalopathy post cardiac arrest Seizure disorder Keepra to continue at current dosing Continue clonazepam  DM2 with hyperglycemia> currently well controlled Continue lantus and SSI as ordered  Hypertension with chronic diastolic CHF CAD Continue plavix, metoprolol Holding lasix for  now, monitor exam  Chronic rhinitis Singulair   Best Practice (right click and "Reselect all SmartList Selections" daily)   Diet/type:  tubefeeds Pain/Anxiety/Delirium protocol Not indicated VAP protocol (if indicated): Yes DVT prophylaxis: LMWH GI prophylaxis: H2B Glucose control:  SSI Central venous access:  N/A Arterial line:  N/A Foley:  N/A Mobility:  bed rest  PT consulted: N/A Studies pending: None Culture data pending:none Last reviewed culture data:today Antibiotics:not indicated  Antibiotic de-escalation: yes, de-escalation ordered.  Stop date: N/A Daily labs: not indicated Code Status:  full code Last date of multidisciplinary goals of care discussion [unclear] ccm prognosis: Serious, long term prognosis poor Disposition: remains critically ill, will stay in intensive care discuss LTACH with social work, suspect she will be ventilator dependent for the rest of her life.        Labs   CBC: Recent Labs  Lab 10/04/20 0051 10/05/20 0049 10/05/20 1802 10/06/20 0548 10/07/20 0106 10/08/20 0610 10/09/20 0159  WBC 10.9* 11.0*  --  16.9* 13.4* 19.0* 15.4*  NEUTROABS 9.3* 9.1*  --  13.3*  --  13.7* 9.4*  HGB 9.4* 10.4* 12.9 12.0 10.3* 12.0 12.0  HCT 30.3* 33.8* 38.0 39.9 33.3* 38.0 38.1  MCV 87.1 85.8  --  88.3 86.3 85.6 84.5  PLT 262 308  --  288 297 394 344    Basic Metabolic Panel: Recent Labs  Lab 10/03/20 0654 10/04/20 0051 10/05/20 0049 10/05/20 1802 10/06/20 0548 10/07/20 0106 10/08/20 0610 10/09/20 0159  NA 138 141 139 141 139 141 136 136  K 4.2 4.3 4.2 4.1 4.2 4.1 4.4 3.8  CL 108 110 105  --  101 103 103 98  CO2 23 25 28   --  24 31 25 29   GLUCOSE 241* 235* 257*  --  328* 214* 184* 144*  BUN 14 14 15   --  16 20 15 17   CREATININE 0.43* 0.49 0.48  --  0.53 0.41* 0.38* 0.41*  CALCIUM 10.1 10.2 10.4*  --  11.2* 10.6* 10.7* 10.4*  MG 2.0 2.0 2.0  --  2.1 1.9  --   --   PHOS  --   --   --   --   --  1.9* 2.1*  --    GFR: Estimated Creatinine Clearance: 71.6 mL/min (A) (by C-G formula based on SCr of 0.41 mg/dL (L)). Recent Labs  Lab 10/04/20 0051 10/05/20 0049  10/06/20 0548 10/07/20 0106 10/08/20 0610 10/09/20 0159  PROCALCITON <0.10 <0.10 <0.10 <0.10  --   --   WBC 10.9* 11.0* 16.9* 13.4* 19.0* 15.4*    Liver Function Tests: Recent Labs  Lab 10/04/20 0051 10/05/20 0049 10/06/20 0548 10/08/20 0610 10/09/20 0159  AST 17 21 19 18 16   ALT 26 28 33 29 27  ALKPHOS 79 76 92 83 81  BILITOT 0.2* 0.2* 0.6 0.6 0.6  PROT 5.9* 6.2* 6.8 6.4* 6.2*  ALBUMIN 2.5* 2.7* 3.1* 3.0* 3.0*   No results for input(s): LIPASE, AMYLASE in the last 168 hours. No results for input(s): AMMONIA in the last 168 hours.  ABG    Component Value Date/Time   PHART 7.405 10/05/2020 1802   PCO2ART 47.6 10/05/2020 1802   PO2ART 186 (H) 10/05/2020 1802   HCO3 29.8 (H) 10/05/2020 1802   TCO2 31 10/05/2020 1802   O2SAT 100.0 10/05/2020 1802     Coagulation Profile: No results for input(s): INR, PROTIME in the last 168 hours.  Cardiac Enzymes: No results for input(s): CKTOTAL, CKMB, CKMBINDEX, TROPONINI  in the last 168 hours.  HbA1C: Hgb A1c MFr Bld  Date/Time Value Ref Range Status  10-07-20 06:31 PM 7.0 (H) 4.8 - 5.6 % Final    Comment:    (NOTE)         Prediabetes: 5.7 - 6.4         Diabetes: >6.4         Glycemic control for adults with diabetes: <7.0   08/30/2019 04:53 AM 6.5 (H) 4.8 - 5.6 % Final    Comment:    (NOTE) Pre diabetes:          5.7%-6.4% Diabetes:              >6.4% Glycemic control for   <7.0% adults with diabetes     CBG: Recent Labs  Lab 10/08/20 1615 10/08/20 1916 10/08/20 2310 10/09/20 0314 10/09/20 0706  GLUCAP 213* 150* 119* 159* 159*    Critical care time: 31 minute    Heber Spur, MD [facility] Pager: [phone] Cell: [phone] If no response, please call [phone] until 7pm After 7:00 pm call Elink  [phone]

## 2020-10-10 DIAGNOSIS — J9601 Acute respiratory failure with hypoxia: Secondary | ICD-10-CM | POA: Diagnosis not present

## 2020-10-10 DIAGNOSIS — I5032 Chronic diastolic (congestive) heart failure: Secondary | ICD-10-CM

## 2020-10-10 DIAGNOSIS — A419 Sepsis, unspecified organism: Secondary | ICD-10-CM | POA: Diagnosis not present

## 2020-10-10 DIAGNOSIS — J9621 Acute and chronic respiratory failure with hypoxia: Secondary | ICD-10-CM | POA: Diagnosis not present

## 2020-10-10 LAB — CBC WITH DIFFERENTIAL/PLATELET
Abs Immature Granulocytes: 0.24 10*3/uL — ABNORMAL HIGH (ref 0.00–0.07)
Basophils Absolute: 0 10*3/uL (ref 0.0–0.1)
Basophils Relative: 0 %
Eosinophils Absolute: 0.5 10*3/uL (ref 0.0–0.5)
Eosinophils Relative: 4 %
HCT: 34.9 % — ABNORMAL LOW (ref 36.0–46.0)
Hemoglobin: 11 g/dL — ABNORMAL LOW (ref 12.0–15.0)
Immature Granulocytes: 2 %
Lymphocytes Relative: 26 %
Lymphs Abs: 3.1 10*3/uL (ref 0.7–4.0)
MCH: 27.1 pg (ref 26.0–34.0)
MCHC: 31.5 g/dL (ref 30.0–36.0)
MCV: 86 fL (ref 80.0–100.0)
Monocytes Absolute: 0.9 10*3/uL (ref 0.1–1.0)
Monocytes Relative: 8 %
Neutro Abs: 7.3 10*3/uL (ref 1.7–7.7)
Neutrophils Relative %: 60 %
Platelets: 285 10*3/uL (ref 150–400)
RBC: 4.06 MIL/uL (ref 3.87–5.11)
RDW: 16.1 % — ABNORMAL HIGH (ref 11.5–15.5)
WBC: 12.1 10*3/uL — ABNORMAL HIGH (ref 4.0–10.5)
nRBC: 0 % (ref 0.0–0.2)

## 2020-10-10 LAB — COMPREHENSIVE METABOLIC PANEL
ALT: 22 U/L (ref 0–44)
AST: 16 U/L (ref 15–41)
Albumin: 2.3 g/dL — ABNORMAL LOW (ref 3.5–5.0)
Alkaline Phosphatase: 74 U/L (ref 38–126)
Anion gap: 7 (ref 5–15)
BUN: 18 mg/dL (ref 8–23)
CO2: 28 mmol/L (ref 22–32)
Calcium: 9.7 mg/dL (ref 8.9–10.3)
Chloride: 96 mmol/L — ABNORMAL LOW (ref 98–111)
Creatinine, Ser: 0.48 mg/dL (ref 0.44–1.00)
GFR, Estimated: >60 mL/min (ref >60)
Glucose, Bld: 226 mg/dL — ABNORMAL HIGH (ref 70–99)
Potassium: 3.8 mmol/L (ref 3.5–5.1)
Sodium: 131 mmol/L — ABNORMAL LOW (ref 135–145)
Total Bilirubin: 0.2 mg/dL — ABNORMAL LOW (ref 0.3–1.2)
Total Protein: 5.2 g/dL — ABNORMAL LOW (ref 6.5–8.1)

## 2020-10-10 LAB — GLUCOSE, CAPILLARY
Glucose-Capillary: 185 mg/dL — ABNORMAL HIGH (ref 70–99)
Glucose-Capillary: 137 mg/dL — ABNORMAL HIGH (ref 70–99)
Glucose-Capillary: 117 mg/dL — ABNORMAL HIGH (ref 70–99)
Glucose-Capillary: 193 mg/dL — ABNORMAL HIGH (ref 70–99)
Glucose-Capillary: 162 mg/dL — ABNORMAL HIGH (ref 70–99)
Glucose-Capillary: 153 mg/dL — ABNORMAL HIGH (ref 70–99)

## 2020-10-10 MED ORDER — POTASSIUM CHLORIDE 20 MEQ PO PACK
40.0000 meq | PACK | Freq: Once | ORAL | Status: AC
  Administered 2020-10-10: 40 meq
  Filled 2020-10-10: qty 2

## 2020-10-10 MED ORDER — FUROSEMIDE 10 MG/ML IJ SOLN
20.0000 mg | Freq: Once | INTRAMUSCULAR | Status: AC
  Administered 2020-10-10: 20 mg via INTRAVENOUS
  Filled 2020-10-10: qty 2

## 2020-10-10 NOTE — Progress Notes (Signed)
NAME:  [facility], MRN:  [id number], DOB:  1956/01/04, LOS: 9 ADMISSION DATE:  10/07/2020, CONSULTATION DATE:  6/15 REFERRING MD:  Thedore Mins, CHIEF COMPLAINT:  Dyspnea   History of Present Illness:  65 y/o female with anoxic brain injury, tracheostomy status who was admitted from a SNF on 6/12 with worsening respiratory failure.  She had previously been ventilator dependent but had been weaned form the trach.  On 6/16 a bronchoscopy revealed dynamic collapse of her trachea, tracheostomy was replaced.  Later required mechanical ventilation.   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 No distress. WBC ct trending down. No events overnight growing GPC clusters in two of two cultures  6/16 increased work of breathing, upper airway noise, abdominal muscle use.  Bronchoscopy performed as trach exchange cuffless #8 Shiley XLT showed dynamic airway collapse, tracheal collapse. 6/16 continued respiratory distress moved to ICU, changed to cuffed #6 Shiley XLT, placed on MV with some improved comfort 6/12 cefepime > 6/14, 6/18 6/12 vanc > 6/14, 6/18 > 6/19 ATC x2 hours  Interim History / Subjective:  Afebrile / WBC 12.1 Blood cultures pending Glucose range 137-185 Vent 40%, PEEP 5, PS 12  No acute events per RN, weaning on PSV  Objective   Blood pressure 109/74, pulse 100, temperature 98.3 F (36.8 C), temperature source Axillary, resp. rate 16, height 5\' 2"  (1.575 m), weight 88.4 kg, SpO2 99 %.    Vent Mode: PRVC FiO2 (%):  [40 %] 40  % Set Rate:  [16 bmp] 16 bmp Vt Set:  [400 mL] 400 mL PEEP:  [5 cmH20] 5 cmH20 Pressure Support:  [10 cmH20] 10 cmH20 Plateau Pressure:  [15 cmH20-18 cmH20] 15 cmH20   Intake/Output Summary (Last 24 hours) at 10/10/2020 0940 Last data filed at 10/10/2020 0500 Gross per 24 hour  Intake 835 ml  Output 950 ml  Net -115 ml   Filed Weights   10/08/20 0158 10/09/20 0206 10/10/20 0500  Weight: 89 kg 88.9 kg 88.4 kg    Examination: General: chronically ill appearing adult female lying in bed in NAD on vent HEENT: MM pink/moist, trach midline c/d/I, on PSV, eyes open Neuro: Eyes open, equal pupils, no follow commands  CV: s1s2 RRR, no m/r/g PULM: non-labored on vent, lungs bilaterally without wheeze GI: soft, bsx4 active, PEG clean / dry  Extremities: warm/dry, trace dependent edema, changes consistent with chronic immobility  Skin: no rashes or lesion  Labs/imaging that I havepersonally reviewed  (right click and "Reselect all SmartList Selections" daily)  6/19 CXR> images personally reviewed,  no infiltrate, tracheostomy in place Resp cx 6/13 >> pseudomonas + klebsiella (? Colonizers) Blood 6/12 >> 2 of 2 S. Epi + S. 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 -PRVC as rest mode  -PSV wean with goal for ATC -continue duoneb, pulmicort  -follow intermittent CXR  Staph bacteremia 2/2 cultures (contaminant per ID), monitoring surveillance cultures -monitor off abx -follow up repeat blood cultures, negative thus  far  Hypernatremia  -monitor off free water -trend BMP  Chronic anoxic encephalopathy post cardiac arrest Seizure disorder -keppra PT  -continue clonazepam   DM2 with hyperglycemia> currently well controlled -glucose controlled  -continue SSI moderate scale -lantus 40  units QHS   -3 units Q4 for TF coverage  Hypertension with chronic diastolic CHF CAD -continue plavix, metoprolol  -lasix 20 mg IV x1   Chronic rhinitis -singulair   Best Practice (right click and "Reselect all SmartList Selections" daily)  Diet/type: tubefeeds Pain/Anxiety/Delirium protocol Not indicated VAP protocol (if indicated): Yes DVT prophylaxis: LMWH GI prophylaxis: H2B Glucose control:  SSI Central venous access:  N/A Arterial line:  N/A Foley:  N/A Mobility:  bed rest  PT consulted: N/A Studies pending: None Culture data pending:blood Last reviewed culture data:today Antibiotics:not indicated  Antibiotic de-escalation: N/A Stop date: N/A Daily labs: not indicated Code Status:  full code Last date of multidisciplinary goals of care discussion [unclear] ccm prognosis: Serious, long term prognosis poor Disposition: remains critically ill, will stay in intensive care discuss LTACH with social work, suspect she will be ventilator dependent for the rest of her life.   Critical care time: 31 minutes     Canary Brim, MSN, APRN, NP-C, AGACNP-BC [facility] 10/10/2020, 9:41 AM   Please see [email or web address] for pager details.   From 7A-7P if no response, please call [phone] After hours, please call ELink [phone]

## 2020-10-10 NOTE — Plan of Care (Signed)
  Problem: Clinical Measurements: Goal: Ability to maintain clinical measurements within normal limits will improve 10/10/2020 0557 by Lorina Rabon, RN Outcome: Progressing 10/10/2020 0252 by Lorina Rabon, RN Outcome: Progressing Goal: Will remain free from infection 10/10/2020 0557 by Lorina Rabon, RN Outcome: Progressing 10/10/2020 0252 by Lorina Rabon, RN Outcome: Progressing Goal: Diagnostic test results will improve 10/10/2020 0557 by Lorina Rabon, RN Outcome: Progressing 10/10/2020 0252 by Lorina Rabon, RN Outcome: Progressing Goal: Cardiovascular complication will be avoided 10/10/2020 0557 by Lorina Rabon, RN Outcome: Progressing 10/10/2020 0252 by Lorina Rabon, RN Outcome: Progressing   Problem: Activity: Goal: Risk for activity intolerance will decrease 10/10/2020 0557 by Lorina Rabon, RN Outcome: Progressing 10/10/2020 0252 by Lorina Rabon, RN Outcome: Progressing   Problem: Nutrition: Goal: Adequate nutrition will be maintained 10/10/2020 0557 by Lorina Rabon, RN Outcome: Progressing 10/10/2020 0252 by Lorina Rabon, RN Outcome: Progressing   Problem: Coping: Goal: Level of anxiety will decrease 10/10/2020 0557 by Lorina Rabon, RN Outcome: Progressing 10/10/2020 0252 by Lorina Rabon, RN Outcome: Progressing   Problem: Elimination: Goal: Will not experience complications related to bowel motility 10/10/2020 0557 by Lorina Rabon, RN Outcome: Progressing 10/10/2020 0252 by Lorina Rabon, RN Outcome: Progressing Goal: Will not experience complications related to urinary retention 10/10/2020 0557 by Lorina Rabon, RN Outcome: Progressing 10/10/2020 0252 by Lorina Rabon, RN Outcome: Progressing   Problem: Pain Managment: Goal: General experience of comfort will improve 10/10/2020 0557 by Lorina Rabon, RN Outcome: Progressing 10/10/2020 0252 by Lorina Rabon, RN Outcome: Progressing   Problem: Safety: Goal:  Ability to remain free from injury will improve 10/10/2020 0557 by Lorina Rabon, RN Outcome: Progressing 10/10/2020 0252 by Lorina Rabon, RN Outcome: Progressing   Problem: Skin Integrity: Goal: Risk for impaired skin integrity will decrease 10/10/2020 0557 by Lorina Rabon, RN Outcome: Progressing 10/10/2020 0252 by Lorina Rabon, RN Outcome: Progressing   Problem: Activity: Goal: Ability to tolerate increased activity will improve 10/10/2020 0557 by Lorina Rabon, RN Outcome: Progressing 10/10/2020 0252 by Lorina Rabon, RN Outcome: Progressing   Problem: Education: Goal: Knowledge of General Education information will improve Description: Including pain rating scale, medication(s)/side effects and non-pharmacologic comfort measures 10/10/2020 0557 by Lorina Rabon, RN Outcome: Not Progressing 10/10/2020 0252 by Lorina Rabon, RN Outcome: Not Progressing   Problem: Health Behavior/Discharge Planning: Goal: Ability to manage health-related needs will improve 10/10/2020 0557 by Lorina Rabon, RN Outcome: Not Progressing 10/10/2020 0252 by Lorina Rabon, RN Outcome: Not Progressing   Problem: Clinical Measurements: Goal: Respiratory complications will improve 10/10/2020 0557 by Lorina Rabon, RN Outcome: Not Progressing 10/10/2020 0252 by Lorina Rabon, RN Outcome: Not Progressing   Problem: Respiratory: Goal: Ability to maintain a clear airway and adequate ventilation will improve 10/10/2020 0557 by Lorina Rabon, RN Outcome: Not Progressing 10/10/2020 0252 by Lorina Rabon, RN Outcome: Progressing   Problem: Role Relationship: Goal: Method of communication will improve 10/10/2020 0557 by Lorina Rabon, RN Outcome: Not Progressing 10/10/2020 0252 by Lorina Rabon, RN Outcome: Progressing

## 2020-10-11 LAB — CBC
HCT: 36.2 % (ref 36.0–46.0)
Hemoglobin: 11.3 g/dL — ABNORMAL LOW (ref 12.0–15.0)
MCH: 26.9 pg (ref 26.0–34.0)
MCHC: 31.2 g/dL (ref 30.0–36.0)
MCV: 86.2 fL (ref 80.0–100.0)
Platelets: 288 10*3/uL (ref 150–400)
RBC: 4.2 MIL/uL (ref 3.87–5.11)
RDW: 16.4 % — ABNORMAL HIGH (ref 11.5–15.5)
WBC: 14.2 10*3/uL — ABNORMAL HIGH (ref 4.0–10.5)
nRBC: 0 % (ref 0.0–0.2)

## 2020-10-11 LAB — GLUCOSE, CAPILLARY
Glucose-Capillary: 178 mg/dL — ABNORMAL HIGH (ref 70–99)
Glucose-Capillary: 191 mg/dL — ABNORMAL HIGH (ref 70–99)
Glucose-Capillary: 188 mg/dL — ABNORMAL HIGH (ref 70–99)
Glucose-Capillary: 164 mg/dL — ABNORMAL HIGH (ref 70–99)
Glucose-Capillary: 177 mg/dL — ABNORMAL HIGH (ref 70–99)
Glucose-Capillary: 174 mg/dL — ABNORMAL HIGH (ref 70–99)

## 2020-10-11 LAB — BASIC METABOLIC PANEL
Anion gap: 9 (ref 5–15)
BUN: 14 mg/dL (ref 8–23)
CO2: 27 mmol/L (ref 22–32)
Calcium: 10.3 mg/dL (ref 8.9–10.3)
Chloride: 99 mmol/L (ref 98–111)
Creatinine, Ser: 0.41 mg/dL — ABNORMAL LOW (ref 0.44–1.00)
GFR, Estimated: >60 mL/min (ref >60)
Glucose, Bld: 188 mg/dL — ABNORMAL HIGH (ref 70–99)
Potassium: 4 mmol/L (ref 3.5–5.1)
Sodium: 135 mmol/L (ref 135–145)

## 2020-10-11 NOTE — Plan of Care (Signed)

## 2020-10-11 NOTE — Progress Notes (Signed)
Nutrition Follow-up  DOCUMENTATION CODES:   Not applicable  INTERVENTION:   Continue Jevity 1.5 @ 45 ml/hr via PEG with Prosource TF 45 ml BID.    Tube feeding regimen provides 1700 kcal (100% of needs), 91 grams of protein, and 821 ml of free water daily.    NUTRITION DIAGNOSIS:   Increased nutrient needs related to wound healing as evidenced by estimated needs.  Ongoing  GOAL:   Patient will meet greater than or equal to 90% of their needs  Met with TF  MONITOR:   Labs, Weight trends, TF tolerance, Skin, I & O's  REASON FOR ASSESSMENT:   Consult Enteral/tube feeding initiation and management  ASSESSMENT:   Sonia Drake is a 65 y.o. female with medical history significant of asthma/COPD, CHF, cardiac arrest with PEA/CPR with chronic respiratory failure on permanent trach with supplemental oxygen at 6L,  type 2 DM, GERD, seizures and severe hypoxic-ischemic encephalopathy who presented to ER via EMS for respiratory distress from SNF.  Discussed patient in ICU rounds and with RN today. Patient will need a vent SNF at discharge. Patient is tolerating TF via PEG (Jevity 1.5 at 45 ml/h with Prosource TF 45 ml BID).   Remains intubated on ventilator support MV: 7.4 L/min Temp (24hrs), Avg:98.1 F (36.7 C), Min:97.7 F (36.5 C), Max:98.3 F (36.8 C)    Medications reviewed and include colace, novolog, lantus, keppra, miralax, klor-con, senokot-s.   Labs reviewed.  CBG: [phone]  Weight stable at 88.4 kg  Diet Order:   Diet Order             Diet NPO time specified  Diet effective now                   EDUCATION NEEDS:   No education needs have been identified at this time  Skin:  Skin Assessment: Skin Integrity Issues: Skin Integrity Issues:: Stage II Stage II: buttocks  Last BM:  6/22 type 7  Height:   Ht Readings from Last 1 Encounters:  10/08/20 5' 2"  (1.575 m)    Weight:   Wt Readings from Last 1 Encounters:  10/11/20 88.4 kg     Ideal Body Weight:  45.5 kg  BMI:  Body mass index is 35.65 kg/m.  Estimated Nutritional Needs:   Kcal:  1600-1800  Protein:  90-105 grams  Fluid:  > 1.6 L   Sonia Drake, RD, LDN, CNSC Please refer to Amion for contact information.

## 2020-10-11 NOTE — Progress Notes (Signed)
Daughter, [name], updated at bedside on plan of care.  Reviewed need for vent weaning and possibility that she may not come off mechanical ventilation & required long term vent assistance in some form.  She is aware and familiar with weaning process as her mother has completed this once before.  Care management asked to assist with placement process.  Support offered.       Canary Brim, MSN, APRN, NP-C, AGACNP-BC [facility] 10/11/2020, 12:00 PM   Please see [email or web address] for pager details.   From 7A-7P if no response, please call [phone] After hours, please call ELink [phone]

## 2020-10-11 NOTE — Progress Notes (Signed)
PROGRESS NOTE    Sonia Drake  DZH:[id number] DOB: [birth year] DOA: 10/14/2020 PCP: Eloisa Northern, MD  [id number]   Assessment & Plan:   Principal Problem:   Sepsis [facility]) Active Problems:   COPD (chronic obstructive pulmonary disease) (HCC)   Chronic diastolic CHF (congestive heart failure) (HCC)   Acute on chronic respiratory failure with hypoxia (HCC)   GERD (gastroesophageal reflux disease)   Seizures (HCC)   Type 2 diabetes mellitus without complication, with long-term current use of insulin (HCC)   Severe hypoxic-ischemic encephalopathy   Tracheostomy dependence (HCC)   Pressure injury of skin    Sonia Drake is a 65 y/o female with anoxic brain injury, tracheostomy status who was admitted from a SNF on 6/12 with worsening respiratory failure.  She had previously been ventilator dependent but had been weaned form the trach.  On 6/16 a bronchoscopy revealed dynamic collapse of her trachea, tracheostomy was replaced.  Later required mechanical ventilation.   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 --still on full vent support, critical care to manage -continue duoneb, pulmicort   Staph bacteremia 2/2 cultures (contaminant per ID), monitoring surveillance cultures -monitor off abx -follow up repeat blood cultures, negative thus far   Hyponatremia --Hold free water --trend Na   Chronic anoxic encephalopathy post cardiac arrest Seizure disorder --cont Kep -continue clonazepam    DM2 with hyperglycemia> currently well controlled -lantus 40  units QHS -3 units Q4 for TF coverage --SSI q4h   Hypertension  --BP intermittently low --cont Lopressor  chronic diastolic CHF --intermittent diuresis  CAD --cont plavix   Chronic rhinitis -singulair and claritin  PEG tube dependence --cont tube feed  --hold free water due to hyponatremia    DVT prophylaxis: Lovenox SQ Code  Status: Full code  Family Communication:  Level of care: ICU Dispo:   The patient is from: SNF Anticipated d/c is to: undetermined Anticipated d/c date is: undetermined Patient currently is not medically ready to d/c due to: on full support vent   Subjective and Interval History:  Pt was transferred from ICU service to hospitalist service, however, pt remained on full vent support, unable to tolerate pressure support mode.    RN reported one episode of urinary retention overnight.  Pt remained unresponsive.     Objective: Vitals:   10/11/20 1800 10/11/20 1922 10/11/20 1932 10/11/20 1936  BP: (!) 137/91 123/83    Pulse: 97 99    Resp: 15 16    Temp:    98 F (36.7 C)  TempSrc:    Oral  SpO2: 100% 100% 99%   Weight:      Height:        Intake/Output Summary (Last 24 hours) at 10/11/2020 2035 Last data filed at 10/11/2020 1800 Gross per 24 hour  Intake 1230 ml  Output 1275 ml  Net -45 ml   Filed Weights   10/09/20 0206 10/10/20 0500 10/11/20 0600  Weight: 88.9 kg 88.4 kg 88.4 kg    Examination:   Constitutional: unresponsive, jerking when touched RESP: on the vent via trach Extremities: No effusions, edema in BLE SKIN: warm, dry GI: PEG tube present, rectal tube present   Data Reviewed: I have personally reviewed following labs and imaging studies  CBC: Recent Labs  Lab 10/05/20 0049 10/05/20 1802 10/06/20 0548 10/07/20 0106 10/08/20 0610 10/09/20 0159 10/10/20 0148 10/11/20 0103  WBC 11.0*  --  16.9* 13.4* 19.0* 15.4* 12.1* 14.2*  NEUTROABS 9.1*  --  13.3*  --  13.7* 9.4* 7.3  --   HGB 10.4*   < > 12.0 10.3* 12.0 12.0 11.0* 11.3*  HCT 33.8*   < > 39.9 33.3* 38.0 38.1 34.9* 36.2  MCV 85.8  --  88.3 86.3 85.6 84.5 86.0 86.2  PLT 308  --  288 297 394 344 285 288   < > = values in this interval not displayed.   Basic Metabolic Panel: Recent Labs  Lab 10/05/20 0049 10/05/20 1802 10/06/20 0548 10/07/20 0106 10/08/20 0610 10/09/20 0159  10/10/20 0148 10/11/20 0103  NA 139   < > 139 141 136 136 131* 135  K 4.2   < > 4.2 4.1 4.4 3.8 3.8 4.0  CL 105  --  101 103 103 98 96* 99  CO2 28  --  24 31 25 29 28 27   GLUCOSE 257*  --  328* 214* 184* 144* 226* 188*  BUN 15  --  16 20 15 17 18 14   CREATININE 0.48  --  0.53 0.41* 0.38* 0.41* 0.48 0.41*  CALCIUM 10.4*  --  11.2* 10.6* 10.7* 10.4* 9.7 10.3  MG 2.0  --  2.1 1.9  --   --   --   --   PHOS  --   --   --  1.9* 2.1*  --   --   --    < > = values in this interval not displayed.   GFR: Estimated Creatinine Clearance: 71.4 mL/min (A) (by C-G formula based on SCr of 0.41 mg/dL (L)). Liver Function Tests: Recent Labs  Lab 10/05/20 0049 10/06/20 0548 10/08/20 0610 10/09/20 0159 10/10/20 0148  AST 21 19 18 16 16   ALT 28 33 29 27 22   ALKPHOS 76 92 83 81 74  BILITOT 0.2* 0.6 0.6 0.6 0.2*  PROT 6.2* 6.8 6.4* 6.2* 5.2*  ALBUMIN 2.7* 3.1* 3.0* 3.0* 2.3*   No results for input(s): LIPASE, AMYLASE in the last 168 hours. No results for input(s): AMMONIA in the last 168 hours. Coagulation Profile: No results for input(s): INR, PROTIME in the last 168 hours. Cardiac Enzymes: No results for input(s): CKTOTAL, CKMB, CKMBINDEX, TROPONINI in the last 168 hours. BNP (last 3 results) No results for input(s): PROBNP in the last 8760 hours. HbA1C: No results for input(s): HGBA1C in the last 72 hours. CBG: Recent Labs  Lab 10/11/20 0302 10/11/20 0714 10/11/20 1142 10/11/20 1517 10/11/20 1903  GLUCAP 178* 191* 188* 164* 177*   Lipid Profile: No results for input(s): CHOL, HDL, LDLCALC, TRIG, CHOLHDL, LDLDIRECT in the last 72 hours. Thyroid Function Tests: No results for input(s): TSH, T4TOTAL, FREET4, T3FREE, THYROIDAB in the last 72 hours. Anemia Panel: No results for input(s): VITAMINB12, FOLATE, FERRITIN, TIBC, IRON, RETICCTPCT in the last 72 hours. Sepsis Labs: Recent Labs  Lab 10/05/20 0049 10/06/20 0548 10/07/20 0106  PROCALCITON <0.10 <0.10 <0.10    Recent  Results (from the past 240 hour(s))  MRSA Next Gen by PCR, Nasal     Status: Abnormal   Collection Time: 10/02/20  7:31 AM  Result Value Ref Range Status   MRSA by PCR Next Gen DETECTED (A) NOT DETECTED Final    Comment: RESULT CALLED TO, READ BACK BY AND VERIFIED WITH: RN A.PURNAGE ON 10/07/20 AT 1113 BY E.PARRISH (NOTE) The GeneXpert MRSA Assay (FDA approved for NASAL specimens only), is one component of a comprehensive MRSA colonization surveillance program. It is not intended to diagnose MRSA infection nor to guide or monitor treatment  for MRSA infections. Test performance is not FDA approved in patients less than 67 years old. Performed at [facility], [address] [place], [place] [address]   Culture, Respiratory w Gram Stain     Status: None   Collection Time: 10/02/20  3:33 PM   Specimen: Tracheal Aspirate; Respiratory  Result Value Ref Range Status   Specimen Description TRACHEAL ASPIRATE  Final   Special Requests NONE  Final   Gram Stain   Final    RARE WBC PRESENT,BOTH PMN AND MONONUCLEAR RARE GRAM NEGATIVE RODS Performed at [facility], [address] [place], [place] [address]    Culture   Final    RARE PSEUDOMONAS AERUGINOSA FEW KLEBSIELLA PNEUMONIAE    Report Status 10/05/2020 FINAL  Final   Organism ID, Bacteria PSEUDOMONAS AERUGINOSA  Final   Organism ID, Bacteria KLEBSIELLA PNEUMONIAE  Final      Susceptibility   Klebsiella pneumoniae - MIC*    AMPICILLIN >=32 RESISTANT Resistant     CEFAZOLIN <=4 SENSITIVE Sensitive     CEFEPIME <=0.12 SENSITIVE Sensitive     CEFTAZIDIME <=1 SENSITIVE Sensitive     CEFTRIAXONE <=0.25 SENSITIVE Sensitive     CIPROFLOXACIN <=0.25 SENSITIVE Sensitive     GENTAMICIN <=1 SENSITIVE Sensitive     IMIPENEM 0.5 SENSITIVE Sensitive     TRIMETH/SULFA <=20 SENSITIVE Sensitive     AMPICILLIN/SULBACTAM 4 SENSITIVE Sensitive     PIP/TAZO <=4 SENSITIVE Sensitive     * FEW KLEBSIELLA PNEUMONIAE   Pseudomonas  aeruginosa - MIC*    CEFTAZIDIME 16 INTERMEDIATE Intermediate     CIPROFLOXACIN 1 SENSITIVE Sensitive     GENTAMICIN <=1 SENSITIVE Sensitive     IMIPENEM 2 SENSITIVE Sensitive     * RARE PSEUDOMONAS AERUGINOSA  Culture, blood (routine x 2)     Status: None   Collection Time: 10/03/20  6:48 AM   Specimen: BLOOD RIGHT HAND  Result Value Ref Range Status   Specimen Description BLOOD RIGHT HAND  Final   Special Requests   Final    BOTTLES DRAWN AEROBIC AND ANAEROBIC Blood Culture results may not be optimal due to an inadequate volume of blood received in culture bottles   Culture   Final    NO GROWTH 5 DAYS Performed at [facility], [address] [place], [place] [address]    Report Status 10/08/2020 FINAL  Final  Culture, blood (routine x 2)     Status: None   Collection Time: 10/03/20  6:53 AM   Specimen: BLOOD RIGHT HAND  Result Value Ref Range Status   Specimen Description BLOOD RIGHT HAND  Final   Special Requests   Final    BOTTLES DRAWN AEROBIC AND ANAEROBIC Blood Culture adequate volume   Culture   Final    NO GROWTH 5 DAYS Performed at [facility], [address] [place], [place] [address]    Report Status 10/08/2020 FINAL  Final  MRSA PCR Screening     Status: Abnormal   Collection Time: 10/05/20  5:07 PM  Result Value Ref Range Status   MRSA by PCR POSITIVE (A) NEGATIVE Final    Comment:        The GeneXpert MRSA Assay (FDA approved for NASAL specimens only), is one component of a comprehensive MRSA colonization surveillance program. It is not intended to diagnose MRSA infection nor to guide or monitor treatment for MRSA infections. RESULT CALLED TO, READ BACK BY AND VERIFIED WITH:  Margo Aye RN [phone] 10/05/20 A BROWNING Performed at [facility], [address] [place], [place] [address]   Culture, blood (Routine X 2) w Reflex to ID Panel     Status: None (Preliminary result)   Collection Time: 10/07/20  9:37 AM   Specimen: BLOOD LEFT HAND   Result Value Ref Range Status   Specimen Description BLOOD LEFT HAND  Final   Special Requests   Final    BOTTLES DRAWN AEROBIC ONLY Blood Culture results may not be optimal due to an inadequate volume of blood received in culture bottles   Culture   Final    NO GROWTH 4 DAYS Performed at [facility], [address], [place], [place] [address]    Report Status PENDING  Incomplete  Culture, blood (Routine X 2) w Reflex to ID Panel     Status: None (Preliminary result)   Collection Time: 10/07/20  9:45 AM   Specimen: BLOOD RIGHT HAND  Result Value Ref Range Status   Specimen Description BLOOD RIGHT HAND  Final   Special Requests   Final    BOTTLES DRAWN AEROBIC ONLY Blood Culture adequate volume   Culture   Final    NO GROWTH 4 DAYS Performed at [facility], [address], [place], [place] [address]    Report Status PENDING  Incomplete      Radiology Studies: No results found.   Scheduled Meds:  budesonide  0.5 mg Nebulization BID   chlorhexidine  15 mL Mouth/Throat BID   Chlorhexidine Gluconate Cloth  6 each Topical Daily   clonazePAM  0.5 mg Per Tube BID   clopidogrel  75 mg Per Tube Daily   docusate  100 mg Per Tube BID   enoxaparin (LOVENOX) injection  0.5 mg/kg Subcutaneous Q24H   famotidine  20 mg Per Tube BID   feeding supplement (PROSource TF)  45 mL Per Tube BID   insulin aspart  0-15 Units Subcutaneous Q4H   insulin aspart  3 Units Subcutaneous Q4H   insulin glargine  40 Units Subcutaneous QHS   ipratropium-albuterol  3 mL Nebulization Q4H   levETIRAcetam  2,500 mg Per Tube BID   loratadine  10 mg Per Tube Daily   mouth rinse  15 mL Mouth Rinse 10 times per day   metoprolol tartrate  25 mg Per Tube BID   montelukast  10 mg Per Tube Daily   polyethylene glycol  17 g Per Tube Daily   potassium chloride  20 mEq Per Tube Daily   senna-docusate  1 tablet Per Tube q AM   Continuous Infusions:  feeding supplement (JEVITY 1.5 CAL/FIBER) 1,000 mL  (10/09/20 0300)     LOS: 10 days     Darlin Priestly, MD [facility] If 7PM-7AM, please contact night-coverage 10/11/2020, 8:35 PM

## 2020-10-11 NOTE — Plan of Care (Signed)
  Problem: Clinical Measurements: Goal: Ability to maintain clinical measurements within normal limits will improve Outcome: Progressing Goal: Will remain free from infection Outcome: Progressing Goal: Diagnostic test results will improve Outcome: Progressing Goal: Cardiovascular complication will be avoided Outcome: Progressing   Problem: Activity: Goal: Risk for activity intolerance will decrease Outcome: Progressing   Problem: Nutrition: Goal: Adequate nutrition will be maintained Outcome: Progressing   Problem: Coping: Goal: Level of anxiety will decrease Outcome: Progressing   Problem: Elimination: Goal: Will not experience complications related to bowel motility Outcome: Progressing   Problem: Pain Managment: Goal: General experience of comfort will improve Outcome: Progressing   Problem: Safety: Goal: Ability to remain free from injury will improve Outcome: Progressing   Problem: Skin Integrity: Goal: Risk for impaired skin integrity will decrease Outcome: Progressing   Problem: Activity: Goal: Ability to tolerate increased activity will improve Outcome: Progressing   Problem: Respiratory: Goal: Ability to maintain a clear airway and adequate ventilation will improve Outcome: Progressing   Problem: Education: Goal: Knowledge of General Education information will improve Description: Including pain rating scale, medication(s)/side effects and non-pharmacologic comfort measures Outcome: Not Progressing   Problem: Health Behavior/Discharge Planning: Goal: Ability to manage health-related needs will improve Outcome: Not Progressing   Problem: Clinical Measurements: Goal: Respiratory complications will improve Outcome: Not Progressing   Problem: Elimination: Goal: Will not experience complications related to urinary retention Outcome: Not Progressing   Problem: Role Relationship: Goal: Method of communication will improve Outcome: Not Progressing

## 2020-10-12 ENCOUNTER — Inpatient Hospital Stay (HOSPITAL_COMMUNITY): Payer: Medicare Other

## 2020-10-12 LAB — CBC
HCT: 32.6 % — ABNORMAL LOW (ref 36.0–46.0)
Hemoglobin: 10.2 g/dL — ABNORMAL LOW (ref 12.0–15.0)
MCH: 27.2 pg (ref 26.0–34.0)
MCHC: 31.3 g/dL (ref 30.0–36.0)
MCV: 86.9 fL (ref 80.0–100.0)
Platelets: 286 10*3/uL (ref 150–400)
RBC: 3.75 MIL/uL — ABNORMAL LOW (ref 3.87–5.11)
RDW: 16.3 % — ABNORMAL HIGH (ref 11.5–15.5)
WBC: 11.7 10*3/uL — ABNORMAL HIGH (ref 4.0–10.5)
nRBC: 0 % (ref 0.0–0.2)

## 2020-10-12 LAB — BASIC METABOLIC PANEL
Anion gap: 6 (ref 5–15)
BUN: 13 mg/dL (ref 8–23)
CO2: 31 mmol/L (ref 22–32)
Calcium: 10.4 mg/dL — ABNORMAL HIGH (ref 8.9–10.3)
Chloride: 103 mmol/L (ref 98–111)
Creatinine, Ser: 0.42 mg/dL — ABNORMAL LOW (ref 0.44–1.00)
GFR, Estimated: >60 mL/min (ref >60)
Glucose, Bld: 192 mg/dL — ABNORMAL HIGH (ref 70–99)
Potassium: 4 mmol/L (ref 3.5–5.1)
Sodium: 140 mmol/L (ref 135–145)

## 2020-10-12 LAB — GLUCOSE, CAPILLARY
Glucose-Capillary: 193 mg/dL — ABNORMAL HIGH (ref 70–99)
Glucose-Capillary: 195 mg/dL — ABNORMAL HIGH (ref 70–99)
Glucose-Capillary: 165 mg/dL — ABNORMAL HIGH (ref 70–99)
Glucose-Capillary: 152 mg/dL — ABNORMAL HIGH (ref 70–99)
Glucose-Capillary: 124 mg/dL — ABNORMAL HIGH (ref 70–99)
Glucose-Capillary: 153 mg/dL — ABNORMAL HIGH (ref 70–99)

## 2020-10-12 LAB — CULTURE, BLOOD (ROUTINE X 2)
Culture: NO GROWTH
Culture: NO GROWTH
Special Requests: ADEQUATE

## 2020-10-12 LAB — MAGNESIUM: Magnesium: 1.9 mg/dL (ref 1.7–2.4)

## 2020-10-12 MED ORDER — GUAIFENESIN ER 600 MG PO TB12: 1200.0000 mg | ORAL_TABLET | Freq: Two times a day (BID) | ORAL | Status: DC

## 2020-10-12 MED ORDER — ARFORMOTEROL TARTRATE 15 MCG/2ML IN NEBU
15.0000 ug | INHALATION_SOLUTION | Freq: Two times a day (BID) | RESPIRATORY_TRACT | Status: DC
  Administered 2020-10-12 – 2021-01-27 (×214): 15 ug via RESPIRATORY_TRACT
  Filled 2020-10-12 (×213): qty 2

## 2020-10-12 MED ORDER — GUAIFENESIN 100 MG/5ML PO SOLN
5.0000 mL | Freq: Four times a day (QID) | ORAL | Status: DC
  Administered 2020-10-12 – 2021-01-16 (×383): 100 mg
  Administered 2021-01-16: 5 mL
  Administered 2021-01-17 – 2021-01-27 (×42): 100 mg
  Filled 2020-10-12: qty 10
  Filled 2020-10-12: qty 5
  Filled 2020-10-12 (×3): qty 10
  Filled 2020-10-12: qty 5
  Filled 2020-10-12 (×4): qty 10
  Filled 2020-10-12: qty 5
  Filled 2020-10-12 (×4): qty 10
  Filled 2020-10-12: qty 5
  Filled 2020-10-12 (×7): qty 10
  Filled 2020-10-12 (×2): qty 5
  Filled 2020-10-12: qty 10
  Filled 2020-10-12 (×2): qty 5
  Filled 2020-10-12: qty 10
  Filled 2020-10-12: qty 5
  Filled 2020-10-12 (×9): qty 10
  Filled 2020-10-12: qty 5
  Filled 2020-10-12 (×6): qty 10
  Filled 2020-10-12: qty 5
  Filled 2020-10-12: qty 10
  Filled 2020-10-12: qty 5
  Filled 2020-10-12 (×3): qty 10
  Filled 2020-10-12: qty 5
  Filled 2020-10-12 (×13): qty 10
  Filled 2020-10-12: qty 5
  Filled 2020-10-12 (×5): qty 10
  Filled 2020-10-12: qty 5
  Filled 2020-10-12 (×3): qty 10
  Filled 2020-10-12: qty 5
  Filled 2020-10-12: qty 10
  Filled 2020-10-12: qty 5
  Filled 2020-10-12 (×12): qty 10
  Filled 2020-10-12: qty 20
  Filled 2020-10-12 (×4): qty 10
  Filled 2020-10-12: qty 5
  Filled 2020-10-12 (×9): qty 10
  Filled 2020-10-12: qty 5
  Filled 2020-10-12 (×3): qty 10
  Filled 2020-10-12 (×2): qty 5
  Filled 2020-10-12 (×4): qty 10
  Filled 2020-10-12 (×2): qty 5
  Filled 2020-10-12 (×2): qty 10
  Filled 2020-10-12: qty 50
  Filled 2020-10-12 (×2): qty 10
  Filled 2020-10-12: qty 5
  Filled 2020-10-12: qty 10
  Filled 2020-10-12: qty 5
  Filled 2020-10-12 (×7): qty 10
  Filled 2020-10-12: qty 5
  Filled 2020-10-12 (×4): qty 10
  Filled 2020-10-12: qty 5
  Filled 2020-10-12 (×2): qty 10
  Filled 2020-10-12: qty 5
  Filled 2020-10-12 (×6): qty 10
  Filled 2020-10-12: qty 5
  Filled 2020-10-12 (×5): qty 10
  Filled 2020-10-12: qty 5
  Filled 2020-10-12 (×3): qty 10
  Filled 2020-10-12: qty 5
  Filled 2020-10-12 (×3): qty 10
  Filled 2020-10-12: qty 50
  Filled 2020-10-12 (×4): qty 10
  Filled 2020-10-12: qty 5
  Filled 2020-10-12 (×4): qty 10
  Filled 2020-10-12: qty 5
  Filled 2020-10-12 (×12): qty 10
  Filled 2020-10-12: qty 5
  Filled 2020-10-12 (×7): qty 10
  Filled 2020-10-12: qty 5
  Filled 2020-10-12 (×2): qty 10
  Filled 2020-10-12 (×3): qty 5
  Filled 2020-10-12 (×19): qty 10
  Filled 2020-10-12: qty 5
  Filled 2020-10-12: qty 10
  Filled 2020-10-12: qty 5
  Filled 2020-10-12 (×5): qty 10
  Filled 2020-10-12: qty 5
  Filled 2020-10-12 (×6): qty 10
  Filled 2020-10-12: qty 5
  Filled 2020-10-12 (×3): qty 10
  Filled 2020-10-12: qty 5
  Filled 2020-10-12 (×3): qty 10
  Filled 2020-10-12: qty 5
  Filled 2020-10-12 (×6): qty 10
  Filled 2020-10-12: qty 5
  Filled 2020-10-12: qty 10
  Filled 2020-10-12 (×2): qty 5
  Filled 2020-10-12: qty 10
  Filled 2020-10-12: qty 5
  Filled 2020-10-12 (×4): qty 10
  Filled 2020-10-12: qty 5
  Filled 2020-10-12: qty 10
  Filled 2020-10-12: qty 5
  Filled 2020-10-12: qty 10
  Filled 2020-10-12: qty 5
  Filled 2020-10-12: qty 10
  Filled 2020-10-12: qty 50
  Filled 2020-10-12 (×5): qty 10
  Filled 2020-10-12: qty 50
  Filled 2020-10-12 (×10): qty 10
  Filled 2020-10-12: qty 5
  Filled 2020-10-12 (×4): qty 10
  Filled 2020-10-12: qty 5
  Filled 2020-10-12 (×3): qty 10
  Filled 2020-10-12: qty 50
  Filled 2020-10-12 (×2): qty 10
  Filled 2020-10-12: qty 5
  Filled 2020-10-12 (×2): qty 10
  Filled 2020-10-12: qty 5
  Filled 2020-10-12 (×3): qty 10
  Filled 2020-10-12: qty 5
  Filled 2020-10-12 (×2): qty 10
  Filled 2020-10-12: qty 5
  Filled 2020-10-12: qty 10
  Filled 2020-10-12: qty 5
  Filled 2020-10-12 (×3): qty 10
  Filled 2020-10-12: qty 5
  Filled 2020-10-12 (×3): qty 10
  Filled 2020-10-12: qty 5
  Filled 2020-10-12: qty 10
  Filled 2020-10-12: qty 5
  Filled 2020-10-12 (×6): qty 10
  Filled 2020-10-12: qty 5
  Filled 2020-10-12 (×14): qty 10
  Filled 2020-10-12: qty 5
  Filled 2020-10-12: qty 10
  Filled 2020-10-12: qty 5
  Filled 2020-10-12: qty 10
  Filled 2020-10-12: qty 5
  Filled 2020-10-12 (×5): qty 10
  Filled 2020-10-12 (×3): qty 5
  Filled 2020-10-12: qty 10
  Filled 2020-10-12 (×2): qty 5
  Filled 2020-10-12 (×9): qty 10
  Filled 2020-10-12: qty 5
  Filled 2020-10-12: qty 10
  Filled 2020-10-12: qty 5
  Filled 2020-10-12: qty 10
  Filled 2020-10-12: qty 5
  Filled 2020-10-12 (×6): qty 10
  Filled 2020-10-12: qty 5
  Filled 2020-10-12 (×9): qty 10
  Filled 2020-10-12: qty 5
  Filled 2020-10-12 (×5): qty 10
  Filled 2020-10-12: qty 5
  Filled 2020-10-12 (×3): qty 10
  Filled 2020-10-12: qty 5
  Filled 2020-10-12 (×19): qty 10
  Filled 2020-10-12 (×2): qty 5
  Filled 2020-10-12 (×2): qty 10
  Filled 2020-10-12: qty 5
  Filled 2020-10-12 (×9): qty 10

## 2020-10-12 MED ORDER — REVEFENACIN 175 MCG/3ML IN SOLN
175.0000 ug | Freq: Every day | RESPIRATORY_TRACT | Status: DC
  Administered 2020-10-12 – 2021-01-27 (×104): 175 ug via RESPIRATORY_TRACT
  Filled 2020-10-12 (×111): qty 3

## 2020-10-12 NOTE — Progress Notes (Signed)
PROGRESS NOTE    Sonia Drake  QIH:[id number] DOB: 07/15/1955 DOA: 10/31/20 PCP: Eloisa Northern, MD  [id number]   Assessment & Plan:   Principal Problem:   Sepsis [facility]) Active Problems:   COPD (chronic obstructive pulmonary disease) (HCC)   Chronic diastolic CHF (congestive heart failure) (HCC)   Acute on chronic respiratory failure with hypoxia (HCC)   GERD (gastroesophageal reflux disease)   Seizures (HCC)   Type 2 diabetes mellitus without complication, with long-term current use of insulin (HCC)   Severe hypoxic-ischemic encephalopathy   Tracheostomy dependence (HCC)   Pressure injury of skin    Sonia Drake is a 65 y/o female with medical history significant of asthma/COPD, CHF, cardiac arrest with PEA/CPR with chronic respiratory failure on permanent trach with supplemental oxygen at 6L, type 2 DM, GERD, seizures and severe hypoxic-ischemic encephalopathy who presented to ER via EMS for respiratory distress from SNF.  She had previously been ventilator dependent but had been weaned form the trach.  On 6/16 a bronchoscopy revealed dynamic collapse of her trachea, tracheostomy was replaced.  Later required mechanical ventilation.   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 --per critical care provider, pt may continue to have mucus plugging events causing dynamic collapse of her trachea. Plan: --cont bronchodilator therapy, routine pulmonary toilette, and routine long term mechanical ventilator management. --d/c to vent SNF   Staph bacteremia 2/2 cultures (contaminant per ID), monitoring surveillance  cultures -monitor off abx -follow up repeat blood cultures, negative thus far   Hyponatremia --Hold free water --trend Na   Chronic anoxic encephalopathy post cardiac arrest Seizure disorder --cont Kep -continue clonazepam    DM2 with hyperglycemia> currently well controlled -lantus 40  units QHS -3 units Q4 for TF coverage --SSI q4h   Hypertension  --BP intermittently low --cont lopressor  chronic diastolic CHF --intermittent diuresis  CAD --cont plavix   Chronic rhinitis -singulair and claritin  PEG tube dependence Rectal tube present --cont tube feed --hold free water due to hyponatremia    DVT prophylaxis: Lovenox SQ Code Status: Full code  Family Communication: daughter updated by critical care Level of care: ICU Dispo:   The patient is from: SNF Anticipated d/c is to: vent-SNF Anticipated d/c date is: undetermined  Dr. Kendrick Fries from critical care relayed to the hospitalist director today the opinion that pt is ready to be discharged as is, needing long-term mechanical ventilator management, and that pt will remain with the hospitalist service until discharge to a vent-SNF.   Subjective and Interval History:  This morning pt dropped her ventilator volumes transiently but this was quickly resolved with suctioning.  Because of the bloody nature of the secretions, critical care performed a bronchoscopy that found no new or acute problems that caused the very transient mucus plugging this morning, per critical care.  Dr. Kendrick Fries from critical care relayed to the hospitalist director today the opinion that pt is ready to be discharged as is, needing long-term mechanical ventilator management, and that pt will remain with the hospitalist service until discharge to a vent-SNF.   Objective: Vitals:   10/12/20 1500 10/12/20 1531 10/12/20 1541 10/12/20 1600  BP: (!) 144/84 (!) 144/84    Pulse:  91 89    Resp: 16 (!) 22    Temp:   97.6 F (36.4 C)   TempSrc:    Axillary   SpO2: 100% 100%  99%  Weight:      Height:        Intake/Output Summary (Last 24 hours) at 10/12/2020 1755 Last data filed at 10/12/2020 1600 Gross per 24 hour  Intake 1035 ml  Output 925 ml  Net 110 ml   Filed Weights   10/09/20 0206 10/10/20 0500 10/11/20 0600  Weight: 88.9 kg 88.4 kg 88.4 kg    Examination:   Constitutional: unresponsive, jerking when touched RESP: on the vent via trach Extremities: No effusions, edema in BLE SKIN: warm, dry GI: PEG tube present, rectal tube present   Data Reviewed: I have personally reviewed following labs and imaging studies  CBC: Recent Labs  Lab 10/06/20 0548 10/07/20 0106 10/08/20 0610 10/09/20 0159 10/10/20 0148 10/11/20 0103 10/12/20 0204  WBC 16.9*   < > 19.0* 15.4* 12.1* 14.2* 11.7*  NEUTROABS 13.3*  --  13.7* 9.4* 7.3  --   --   HGB 12.0   < > 12.0 12.0 11.0* 11.3* 10.2*  HCT 39.9   < > 38.0 38.1 34.9* 36.2 32.6*  MCV 88.3   < > 85.6 84.5 86.0 86.2 86.9  PLT 288   < > 394 344 285 288 286   < > = values in this interval not displayed.   Basic Metabolic Panel: Recent Labs  Lab 10/06/20 0548 10/07/20 0106 10/08/20 0610 10/09/20 0159 10/10/20 0148 10/11/20 0103 10/12/20 0204  NA 139 141 136 136 131* 135 140  K 4.2 4.1 4.4 3.8 3.8 4.0 4.0  CL 101 103 103 98 96* 99 103  CO2 24 31 25 29 28 27 31   GLUCOSE 328* 214* 184* 144* 226* 188* 192*  BUN 16 20 15 17 18 14 13   CREATININE 0.53 0.41* 0.38* 0.41* 0.48 0.41* 0.42*  CALCIUM 11.2* 10.6* 10.7* 10.4* 9.7 10.3 10.4*  MG 2.1 1.9  --   --   --   --  1.9  PHOS  --  1.9* 2.1*  --   --   --   --    GFR: Estimated Creatinine Clearance: 71.4 mL/min (A) (by C-G formula based on SCr of 0.42 mg/dL (L)). Liver Function Tests: Recent Labs  Lab 10/06/20 0548 10/08/20 0610 10/09/20 0159 10/10/20 0148  AST 19 18 16 16   ALT 33 29 27 22   ALKPHOS 92 83 81 74  BILITOT 0.6 0.6 0.6 0.2*  PROT 6.8 6.4* 6.2* 5.2*  ALBUMIN 3.1* 3.0* 3.0* 2.3*   No results for  input(s): LIPASE, AMYLASE in the last 168 hours. No results for input(s): AMMONIA in the last 168 hours. Coagulation Profile: No results for input(s): INR, PROTIME in the last 168 hours. Cardiac Enzymes: No results for input(s): CKTOTAL, CKMB, CKMBINDEX, TROPONINI in the last 168 hours. BNP (last 3 results) No results for input(s): PROBNP in the last 8760 hours. HbA1C: No results for input(s): HGBA1C in the last 72 hours. CBG: Recent Labs  Lab 10/11/20 2304 10/12/20 0301 10/12/20 0707 10/12/20 1102 10/12/20 1502  GLUCAP 174* 193* 195* 165* 152*   Lipid Profile: No results for input(s): CHOL, HDL, LDLCALC, TRIG, CHOLHDL, LDLDIRECT in the last 72 hours. Thyroid Function Tests: No results for input(s): TSH, T4TOTAL, FREET4, T3FREE, THYROIDAB in the last 72 hours. Anemia Panel: No results for input(s): VITAMINB12, FOLATE, FERRITIN, TIBC, IRON, RETICCTPCT in the  last 72 hours. Sepsis Labs: Recent Labs  Lab 10/06/20 0548 10/07/20 0106  PROCALCITON <0.10 <0.10    Recent Results (from the past 240 hour(s))  Culture, blood (routine x 2)     Status: None   Collection Time: 10/03/20  6:48 AM   Specimen: BLOOD RIGHT HAND  Result Value Ref Range Status   Specimen Description BLOOD RIGHT HAND  Final   Special Requests   Final    BOTTLES DRAWN AEROBIC AND ANAEROBIC Blood Culture results may not be optimal due to an inadequate volume of blood received in culture bottles   Culture   Final    NO GROWTH 5 DAYS Performed at [facility], [address], [place], [place] [address]    Report Status 10/08/2020 FINAL  Final  Culture, blood (routine x 2)     Status: None   Collection Time: 10/03/20  6:53 AM   Specimen: BLOOD RIGHT HAND  Result Value Ref Range Status   Specimen Description BLOOD RIGHT HAND  Final   Special Requests   Final    BOTTLES DRAWN AEROBIC AND ANAEROBIC Blood Culture adequate volume   Culture   Final    NO GROWTH 5 DAYS Performed at [facility]  Lab, [address], [place], [place] [address]    Report Status 10/08/2020 FINAL  Final  MRSA PCR Screening     Status: Abnormal   Collection Time: 10/05/20  5:07 PM  Result Value Ref Range Status   MRSA by PCR POSITIVE (A) NEGATIVE Final    Comment:        The GeneXpert MRSA Assay (FDA approved for NASAL specimens only), is one component of a comprehensive MRSA colonization surveillance program. It is not intended to diagnose MRSA infection nor to guide or monitor treatment for MRSA infections. RESULT CALLED TO, READ BACK BY AND VERIFIED WITHMargo Aye RN [id number] 10/05/20 A BROWNING Performed at [facility], [address] [place], [place] [address]   Culture, blood (Routine X 2) w Reflex to ID Panel     Status: None   Collection Time: 10/07/20  9:37 AM   Specimen: BLOOD LEFT HAND  Result Value Ref Range Status   Specimen Description BLOOD LEFT HAND  Final   Special Requests   Final    BOTTLES DRAWN AEROBIC ONLY Blood Culture results may not be optimal due to an inadequate volume of blood received in culture bottles   Culture   Final    NO GROWTH 5 DAYS Performed at [facility], [address], [place], [place] [address]    Report Status 10/12/2020 FINAL  Final  Culture, blood (Routine X 2) w Reflex to ID Panel     Status: None   Collection Time: 10/07/20  9:45 AM   Specimen: BLOOD RIGHT HAND  Result Value Ref Range Status   Specimen Description BLOOD RIGHT HAND  Final   Special Requests   Final    BOTTLES DRAWN AEROBIC ONLY Blood Culture adequate volume   Culture   Final    NO GROWTH 5 DAYS Performed at [facility], [address], [place], [place] [address]    Report Status 10/12/2020 FINAL  Final      Radiology Studies: DG CHEST PORT 1 VIEW  Result Date: 10/12/2020 CLINICAL DATA:  Acute respiratory failure, hypoxemia EXAM: PORTABLE CHEST 1 VIEW COMPARISON:  10/08/2020 FINDINGS: The heart size and mediastinal contours are within normal limits.  Tracheostomy in  appropriate position. Both lungs are clear. The visualized skeletal structures are unremarkable. IMPRESSION: 1. No acute abnormality of the lungs in AP portable projection. 2. Tracheostomy in appropriate position. Electronically Signed   By: Lauralyn PrimesAlex  Bibbey M.D.   On: 10/12/2020 09:21     Scheduled Meds:  arformoterol  15 mcg Nebulization BID   budesonide  0.5 mg Nebulization BID   chlorhexidine  15 mL Mouth/Throat BID   Chlorhexidine Gluconate Cloth  6 each Topical Daily   clonazePAM  0.5 mg Per Tube BID   clopidogrel  75 mg Per Tube Daily   docusate  100 mg Per Tube BID   enoxaparin (LOVENOX) injection  0.5 mg/kg Subcutaneous Q24H   famotidine  20 mg Per Tube BID   feeding supplement (PROSource TF)  45 mL Per Tube BID   guaiFENesin  5 mL Per Tube Q6H   insulin aspart  0-15 Units Subcutaneous Q4H   insulin aspart  3 Units Subcutaneous Q4H   insulin glargine  40 Units Subcutaneous QHS   levETIRAcetam  2,500 mg Per Tube BID   loratadine  10 mg Per Tube Daily   mouth rinse  15 mL Mouth Rinse 10 times per day   metoprolol tartrate  25 mg Per Tube BID   montelukast  10 mg Per Tube Daily   polyethylene glycol  17 g Per Tube Daily   potassium chloride  20 mEq Per Tube Daily   revefenacin  175 mcg Nebulization Daily   senna-docusate  1 tablet Per Tube q AM   Continuous Infusions:  feeding supplement (JEVITY 1.5 CAL/FIBER) 1,000 mL (10/12/20 0645)     LOS: 11 days     Darlin Priestlyina Ryun Velez, MD [facility] If 7PM-7AM, please contact night-coverage 10/12/2020, 5:55 PM

## 2020-10-12 NOTE — Progress Notes (Signed)
RT assisted MD with bronchoscopy. Patient was hyperoxygenated with 100% FiO2 before procedure. No complications. RT will continue to monitor.

## 2020-10-12 NOTE — Progress Notes (Signed)
NAME:  Sonia Drake, MRN:  [id number], DOB:  [birth year], LOS: 11 ADMISSION DATE:  10/16/2020, CONSULTATION DATE:  6/15 REFERRING MD:  Thedore Mins, CHIEF COMPLAINT:  Dyspnea   History of Present Illness:  65 y/o female with anoxic brain injury, tracheostomy status who was admitted from a SNF on 6/12 with worsening respiratory failure.  She had previously been ventilator dependent but had been weaned form the trach.  On 6/16 a bronchoscopy revealed dynamic collapse of her trachea, tracheostomy was replaced.  Later required mechanical ventilation.   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 No distress. WBC ct trending down. No events overnight growing GPC clusters in two of two cultures  6/16 increased work of breathing, upper airway noise, abdominal muscle use.  Bronchoscopy performed as trach exchange cuffless #8 Shiley XLT showed dynamic airway collapse, tracheal collapse. 6/16 continued respiratory distress moved to ICU, changed to cuffed #6 Shiley XLT, placed on MV with some improved comfort 6/12 cefepime > 6/14, 6/18 6/12 vanc > 6/14, 6/18  6/23 mucus plugging, severe vent dyssynchrony  Interim History / Subjective:   Called emergently to bedside for ventilator dyssynchrony, not getting volumes from vent Air movement improved with suctioning> large amount of bloody secretions   Objective   Blood pressure (!) 151/86, pulse 99, temperature 98.1 F (36.7 C), temperature source Axillary, resp. rate 19, height 5\' 2"  (1.575 m), weight 88.4  kg, SpO2 98 %.    Vent Mode: PRVC FiO2 (%):  [30 %-40 %] 30 % Set Rate:  [16 bmp] 16 bmp Vt Set:  [400 mL] 400 mL PEEP:  [5 cmH20] 5 cmH20 Pressure Support:  [8 cmH20-10 cmH20] 8 cmH20 Plateau Pressure:  [16 cmH20-28 cmH20] 28 cmH20   Intake/Output Summary (Last 24 hours) at 10/12/2020 10/14/2020 Last data filed at 10/12/2020 0600 Gross per 24 hour  Intake 990 ml  Output 1250 ml  Net -260 ml   Filed Weights   10/09/20 0206 10/10/20 0500 10/11/20 0600  Weight: 88.9 kg 88.4 kg 88.4 kg    Examination:  General:  Chronically ill appearing, in bed on vent HENT: NCAT tracheostomy in place PULM: very little air movement bilaterally, improved transiently with suctioning, increased respiratory effort CV: RRR, no mgr GI: BS+, soft, nontender MSK: normal bulk and tone Neuro: eyes closed, coughs with suctioning  Labs/imaging that I havepersonally reviewed  (right click and "Reselect all SmartList Selections" daily)  6/19 CXR images personally reviewed> no infiltrate, tracheostomy in place Wbc 15 Resp cx 6/13 >> pseudomonas + klebsiella (? Colonizers) Blood 6/12 >> 2 of 2 S. Epi + S. 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, severe Ventilator dependent due to all the above Full support vent support with PRVC Emergent bronchoscopy now> discussed with daughter, gave rationale and risks/benefits, she is willing to proceed STAT CXR Change duoneb to brovana/pulmicort given severe COPD Pulm toilette per routine Tracheostomy care per routine  Staph bacteremia 2/2 cultures (contaminant per ID), monitoring surveillance  cultures Hypernatremia> improved Chronic anoxic encephalopathy post cardiac arrest DM2 with hyperglycemia> currently well controlled Hypertension with chronic diastolic CHF Chronic  rhinitis Per TRH  Goals of care: palliative care following.  Given her anoxic brain injury and ventilator dependence it is unclear to me that we are truly treating her in a manor that she would want.  Continue multi-disciplinary goals of care meetings with daughter.  Best Practice (right click and "Reselect all SmartList Selections" daily)   Diet/type: tubefeeds Pain/Anxiety/Delirium protocol Not indicated VAP protocol (if indicated): Yes DVT prophylaxis: LMWH GI prophylaxis: H2B Glucose control:  SSI Central venous access:  N/A Arterial line:  N/A Foley:  N/A Mobility:  bed rest  PT consulted: N/A Studies pending: None Culture data pending:none Last reviewed culture data:today Antibiotics:not indicated  Antibiotic de-escalation: yes, de-escalation ordered.  Stop date: N/A Daily labs: not indicated Code Status:  full code Last date of multidisciplinary goals of care discussion [unclear] ccm prognosis: Serious, long term prognosis poor Disposition: remains critically ill, will stay in intensive care seek vent snf for her; she will be ventilator dependent for the rest of her life.        Labs   CBC: Recent Labs  Lab 10/06/20 0548 10/07/20 0106 10/08/20 0610 10/09/20 0159 10/10/20 0148 10/11/20 0103 10/12/20 0204  WBC 16.9*   < > 19.0* 15.4* 12.1* 14.2* 11.7*  NEUTROABS 13.3*  --  13.7* 9.4* 7.3  --   --   HGB 12.0   < > 12.0 12.0 11.0* 11.3* 10.2*  HCT 39.9   < > 38.0 38.1 34.9* 36.2 32.6*  MCV 88.3   < > 85.6 84.5 86.0 86.2 86.9  PLT 288   < > 394 344 285 288 286   < > = values in this interval not displayed.    Basic Metabolic Panel: Recent Labs  Lab 10/06/20 0548 10/07/20 0106 10/08/20 0610 10/09/20 0159 10/10/20 0148 10/11/20 0103 10/12/20 0204  NA 139 141 136 136 131* 135 140  K 4.2 4.1 4.4 3.8 3.8 4.0 4.0  CL 101 103 103 98 96* 99 103  CO2 24 31 25 29 28 27 31   GLUCOSE 328* 214* 184* 144* 226* 188* 192*  BUN 16 20 15 17 18 14 13   CREATININE  0.53 0.41* 0.38* 0.41* 0.48 0.41* 0.42*  CALCIUM 11.2* 10.6* 10.7* 10.4* 9.7 10.3 10.4*  MG 2.1 1.9  --   --   --   --  1.9  PHOS  --  1.9* 2.1*  --   --   --   --    GFR: Estimated Creatinine Clearance: 71.4 mL/min (A) (by C-G formula based on SCr of 0.42 mg/dL (L)). Recent Labs  Lab 10/06/20 0548 10/07/20 0106 10/08/20 0610 10/09/20 0159 10/10/20 0148 10/11/20 0103 10/12/20 0204  PROCALCITON <0.10 <0.10  --   --   --   --   --   WBC 16.9* 13.4*   < > 15.4* 12.1* 14.2* 11.7*   < > = values in this interval not displayed.    Liver Function Tests: Recent Labs  Lab 10/06/20 0548 10/08/20 0610 10/09/20 0159 10/10/20 0148  AST 19 18 16 16   ALT 33 29 27 22   ALKPHOS 92 83 81 74  BILITOT 0.6 0.6 0.6 0.2*  PROT 6.8 6.4* 6.2* 5.2*  ALBUMIN 3.1* 3.0* 3.0* 2.3*   No results for input(s): LIPASE, AMYLASE in the last 168 hours. No results for input(s): AMMONIA in the last 168 hours.  ABG  Component Value Date/Time   PHART 7.405 10/05/2020 1802   PCO2ART 47.6 10/05/2020 1802   PO2ART 186 (H) 10/05/2020 1802   HCO3 29.8 (H) 10/05/2020 1802   TCO2 31 10/05/2020 1802   O2SAT 100.0 10/05/2020 1802     Coagulation Profile: No results for input(s): INR, PROTIME in the last 168 hours.  Cardiac Enzymes: No results for input(s): CKTOTAL, CKMB, CKMBINDEX, TROPONINI in the last 168 hours.  HbA1C: Hgb A1c MFr Bld  Date/Time Value Ref Range Status  Oct 30, 2020 06:31 PM 7.0 (H) 4.8 - 5.6 % Final    Comment:    (NOTE)         Prediabetes: 5.7 - 6.4         Diabetes: >6.4         Glycemic control for adults with diabetes: <7.0   08/30/2019 04:53 AM 6.5 (H) 4.8 - 5.6 % Final    Comment:    (NOTE) Pre diabetes:          5.7%-6.4% Diabetes:              >6.4% Glycemic control for   <7.0% adults with diabetes     CBG: Recent Labs  Lab 10/11/20 1517 10/11/20 1903 10/11/20 2304 10/12/20 0301 10/12/20 0707  GLUCAP 164* 177* 174* 193* 195*    Critical care time: 30  minute    Heber [place], MD [place] [facility] Pager: [phone] Cell: [phone] If no response, please call [phone] until 7pm After 7:00 pm call Elink  [phone]

## 2020-10-12 NOTE — Progress Notes (Addendum)
  Late Entry: 10/11/2020 @ 2:41 pm NCM spoke with pt's daughter [place] by phone regarding d/c planning. NCM shared the need for pt to transition to SNF/VENT. facility. Daughter stated hoping pt will be able to wean from ventilator but understands the need for SNF/ Vent facility. NCM left with bedside nurse to give to daughter information on the 3 SNF /Vent facilities pt can transition to, [facility] , [facility] and [facility]. Daughter stated will she pickup information from nurse when she comes to see pt., hoping to come this evening. Ilhan Debenedetto (Daughter)      [phone]      Gae Gallop RN,SN,CM

## 2020-10-12 NOTE — Progress Notes (Signed)
[facility]  I was asked to comment on the patient's stability for discharge.  To clarify: she has two chronic diseases which render her ventilator dependent and will not improve: advanced COPD/asthma and tracheobronchomalacia.    This morning she dropped her ventilator volumes transiently but this was quickly resolved with suctioning.  Because of the bloody nature of the secretions I performed a bronchoscopy after receiving consent from the patient's daughter.  Please see that note. Fortunately there are no new or acute problems that caused the very transient mucus plugging this morning.  Unfortunately, Sonia Drake has advanced obstructive lung disease which is chronic, non-reversible, and has rendered her ventilator dependent for the rest of her life. I have discussed her situation with my partners who have also evaluated her and agree.  I also explained this to her daughter by phone this morning.  Moving forward she needs bronchodilator therapy, routine pulmonary toilette, and routine long term mechanical ventilator management.  This is best accomplished in a vent-SNF setting rather than an LTACH environment.    Will continue to follow along with [facility] service.  Heber [place], MD [place] [facility] Pager: [phone] Cell: [phone] If no response, please call [phone] until 7pm After 7:00 pm call Elink  [phone]

## 2020-10-12 NOTE — NC FL2 (Signed)
[place] MEDICAID FL2 LEVEL OF CARE SCREENING TOOL     IDENTIFICATION  Patient Name: AIRIANA Drake Birthdate: 03-20-1956 Sex: female Admission Date (Current Location): 10/12/2020  [facility] and [place] Number:  Producer, television/film/video and Address:  [facility] [place], [address] [place], [place] [address]      Provider Number: [id number]  Attending Physician Name and Address:  Darlin Priestly, MD  Relative Name and Phone Number:  Mirren Gest (Daughter) [phone]    Current Level of Care: Hospital Recommended Level of Care: Vent SNF (refer to d/c summary) Prior Approval Number:    Date Approved/Denied:   PASRR Number: [id number]  Discharge Plan: SNF    Current Diagnoses: Patient Active Problem List   Diagnosis Date Noted   Pressure injury of skin 10/02/2020   Sepsis (HCC) 10/08/2020   Type 2 diabetes mellitus without complication, with long-term current use of insulin (HCC) 10/02/2020   Tracheostomy dependence (HCC) 10/12/2020   Myoclonus 12/25/2019   Severe hypoxic-ischemic encephalopathy 12/25/2019   COPD with acute exacerbation (HCC) 08/28/2019   Chest pain 08/28/2019   Acute and chronic respiratory failure with hypercapnia (HCC) 01/27/2019   Asthma with COPD with exacerbation (HCC) 01/26/2019   GERD (gastroesophageal reflux disease) 02/16/2018   Seizures (HCC) 02/16/2018   Bronchitis 11/11/2017   Acute on chronic respiratory failure with hypoxia (HCC) 11/03/2017   Respiratory distress    Chronic diastolic CHF (congestive heart failure) (HCC) 09/19/2017   Primary osteoarthritis of left hip 04/07/2017   Osteoarthritis of left hip 04/02/2017   COPD (chronic obstructive pulmonary disease) (HCC) 02/04/2016   Lung nodule 02/04/2016   Asthma exacerbation with COPD (chronic obstructive pulmonary disease) (HCC) 07/21/2015   Influenza B 04/23/2015   Asthma exacerbation 04/22/2015   Hypokalemia 04/22/2015   Sick-euthyroid syndrome 07/03/2014    Influenza with pneumonia 07/03/2014   Multiple lung nodules on CT 07/03/2014   Asthma 07/02/2014   Hyperglycemia 07/02/2014   Chronic obstructive pulmonary disease with acute exacerbation (HCC)     Orientation RESPIRATION BLADDER Height & Weight      (Unresponsive)  Vent, Tracheostomy, O2 External catheter Weight: 88.4 kg Height:  5\' 2"  (157.5 cm)  BEHAVIORAL SYMPTOMS/MOOD NEUROLOGICAL BOWEL NUTRITION STATUS      Incontinent Diet, Feeding tube (refer to d/c summary)  AMBULATORY STATUS COMMUNICATION OF NEEDS Skin   Total Care Does not communicate (unresponsive) Skin abrasions                       Personal Care Assistance Level of Assistance  Bathing, Feeding, Dressing, Total care Bathing Assistance: Maximum assistance Feeding assistance: Maximum assistance Dressing Assistance: Maximum assistance Total Care Assistance: Maximum assistance   Functional Limitations Info   (unable to assess , pt with trach)          SPECIAL CARE FACTORS FREQUENCY  PT (By licensed PT), OT (By licensed OT)     PT Frequency: 5x/week , evaluate and treat OT Frequency: 5x/week , evaluate and treat            Contractures Contractures Info: Present (R AND L UPPER EXTREMITY)    Additional Factors Info  Code Status, Allergies Code Status Info: Full Code Allergies Info: Contrast Media (Iodinated Diagnostic Agents), Peanuts (Peanut Oil), Shellfish Allergy, Soap, Latex, Adenosine, Eggs Or Egg-derived Products           Current Medications (10/12/2020):  This is the current hospital active medication list Current Facility-Administered Medications  Medication Dose  Route Frequency Provider Last Rate Last Admin   albuterol (PROVENTIL) (2.5 MG/3ML) 0.083% nebulizer solution 2.5 mg  2.5 mg Nebulization Q2H PRN Karie Fetch P, DO   2.5 mg at 10/12/20 0805   arformoterol (BROVANA) nebulizer solution 15 mcg  15 mcg Nebulization BID Max Fickle B, MD   15 mcg at 10/12/20 1110   budesonide  (PULMICORT) nebulizer solution 0.5 mg  0.5 mg Nebulization BID Orland Mustard, MD   0.5 mg at 10/12/20 0739   chlorhexidine (PERIDEX) 0.12 % solution 15 mL  15 mL Mouth/Throat BID Leslye Peer, MD   15 mL at 10/12/20 8115   Chlorhexidine Gluconate Cloth 2 % PADS 6 each  6 each Topical Daily Steffanie Dunn, DO   6 each at 10/12/20 1000   clonazePAM (KLONOPIN) tablet 0.5 mg  0.5 mg Per Tube BID Orland Mustard, MD   0.5 mg at 10/12/20 7262   clopidogrel (PLAVIX) tablet 75 mg  75 mg Per Tube Daily Orland Mustard, MD   75 mg at 10/12/20 0904   docusate (COLACE) 50 MG/5ML liquid 100 mg  100 mg Per Tube BID Karie Fetch P, DO   100 mg at 10/11/20 0926   enoxaparin (LOVENOX) injection 45 mg  0.5 mg/kg Subcutaneous Q24H Lodema Hong A, RPH   45 mg at 10/11/20 2144   famotidine (PEPCID) tablet 20 mg  20 mg Per Tube BID Orland Mustard, MD   20 mg at 10/12/20 0905   feeding supplement (JEVITY 1.5 CAL/FIBER) liquid 1,000 mL  1,000 mL Per Tube Continuous Leroy Sea, MD 45 mL/hr at 10/12/20 0645 1,000 mL at 10/12/20 0645   feeding supplement (PROSource TF) liquid 45 mL  45 mL Per Tube BID Leroy Sea, MD   45 mL at 10/12/20 0904   fentaNYL (SUBLIMAZE) injection 25 mcg  25 mcg Intravenous Q15 min PRN Steffanie Dunn, DO   25 mcg at 10/05/20 2117   fentaNYL (SUBLIMAZE) injection 25-100 mcg  25-100 mcg Intravenous Q30 min PRN Karie Fetch P, DO   50 mcg at 10/12/20 1020   guaiFENesin (ROBITUSSIN) 100 MG/5ML solution 100 mg  5 mL Per Tube Q6H Darlin Priestly, MD       hydrALAZINE (APRESOLINE) injection 10 mg  10 mg Intravenous Q4H PRN Shahmehdi, Seyed A, MD   10 mg at 10/08/20 0617   insulin aspart (novoLOG) injection 0-15 Units  0-15 Units Subcutaneous Q4H Nevin Bloodgood A, MD   3 Units at 10/12/20 0910   insulin aspart (novoLOG) injection 3 Units  3 Units Subcutaneous Q4H Leslye Peer, MD   3 Units at 10/12/20 0911   insulin glargine (LANTUS) injection 40 Units  40 Units Subcutaneous QHS Orland Mustard, MD   40 Units at 10/11/20 2144   levETIRAcetam (KEPPRA) 100 MG/ML solution 2,500 mg  2,500 mg Per Tube BID Orland Mustard, MD   2,500 mg at 10/12/20 0909   loratadine (CLARITIN) tablet 10 mg  10 mg Per Tube Daily Karie Fetch P, DO   10 mg at 10/12/20 0355   MEDLINE mouth rinse  15 mL Mouth Rinse 10 times per day Leslye Peer, MD   15 mL at 10/12/20 1000   metoprolol tartrate (LOPRESSOR) tablet 25 mg  25 mg Per Tube BID Leslye Peer, MD   25 mg at 10/12/20 0909   montelukast (SINGULAIR) tablet 10 mg  10 mg Per Tube Daily Orland Mustard, MD   10 mg at 10/12/20 [phone]  polyethylene glycol (MIRALAX / GLYCOLAX) packet 17 g  17 g Per Tube Daily Steffanie Dunn, DO   17 g at 10/11/20 3710   potassium chloride (KLOR-CON) packet 20 mEq  20 mEq Per Tube Daily Orland Mustard, MD   20 mEq at 10/12/20 6269   revefenacin (YUPELRI) nebulizer solution 175 mcg  175 mcg Nebulization Daily Max Fickle B, MD   175 mcg at 10/12/20 1110   senna-docusate (Senokot-S) tablet 1 tablet  1 tablet Per Tube q AM Orland Mustard, MD   1 tablet at 10/11/20 4854     Discharge Medications: Please see discharge summary for a list of discharge medications.  Relevant Imaging Results:  Relevant Lab Results:   Additional Information [id number]  Epifanio Lesches, RN

## 2020-10-12 NOTE — Procedures (Signed)
Bronchoscopy Procedure Note  Sonia Drake  [id number]  Oct 23, 1955  Date:10/12/20  Time:9:02 AM   Provider Performing:Brent Klaryssa Fauth   Procedure(s):  Flexible Bronchoscopy [phone])  Indication(s) Acute on chronic respiratory failure, concern for mucus plugging  Consent Risks of the procedure as well as the alternatives and risks of each were explained to the patient and/or caregiver.  Consent for the procedure was obtained and is signed in the bedside chart  Anesthesia none   Time Out Verified patient identification, verified procedure, site/side was marked, verified correct patient position, special equipment/implants available, medications/allergies/relevant history reviewed, required imaging and test results available.   Sterile Technique Usual hand hygiene, masks, gowns, and gloves were used   Procedure Description Bronchoscope advanced through tracheostomy tube and into airway.  Airways were examined down to subsegmental level with findings noted below.   Following diagnostic evaluation, no procedures were performed.  Findings: the tracheostomy was in good position but there is severe dynamic collapse of the posterior wall of the trachea which will transiently partially (not completely) occlude the distal orifice of the tracheostomy tube.  The remainder of the tracheosbronchial tree was inspected and there was no mucus plugging, bleeding, or mass noted.  However there is severe tracheobronchomalacia causing near complete dynamic collapse of the bilateral bronchial trees, right worse than left.   Complications/Tolerance None; patient tolerated the procedure well. Chest X-ray is not needed post procedure.   EBL Minimal   Specimen(s) None  Recommendations: Continued efforts with routine pulmonary toilette measures.  As there were no secretions noted I don't think that adding inhaled mucolytics will help but will add guafenesin to try to thin any mucus that is  produced to keep it from occluding her tracheostomy.  My suspicion is that when she produces mucus it may transiently occlude her tracheostomy tube due to the dynamic collapse of her trachea.  I do not feel that a different size or positioned tracheostomy tube will change this based on the severity of dynamic collapse of her entire trachea.  Very few options to prevent mucus plugging events.  Roselie Awkward, MD [place] [facility] Pager: [phone] Cell: [phone] If no response, please call [phone] until 7pm After 7:00 pm call Elink  [phone]

## 2020-10-13 ENCOUNTER — Inpatient Hospital Stay (HOSPITAL_COMMUNITY): Payer: Medicare Other

## 2020-10-13 LAB — POCT I-STAT 7, (LYTES, BLD GAS, ICA,H+H)
Acid-Base Excess: 4 mmol/L — ABNORMAL HIGH (ref 0.0–2.0)
Bicarbonate: 31.5 mmol/L — ABNORMAL HIGH (ref 20.0–28.0)
Calcium, Ion: 1.57 mmol/L (ref 1.15–1.40)
HCT: 33 % — ABNORMAL LOW (ref 36.0–46.0)
Hemoglobin: 11.2 g/dL — ABNORMAL LOW (ref 12.0–15.0)
O2 Saturation: 100 %
Potassium: 3.9 mmol/L (ref 3.5–5.1)
Sodium: 140 mmol/L (ref 135–145)
TCO2: 33 mmol/L — ABNORMAL HIGH (ref 22–32)
pCO2 arterial: 63.6 mmHg — ABNORMAL HIGH (ref 32.0–48.0)
pH, Arterial: 7.302 — ABNORMAL LOW (ref 7.350–7.450)
pO2, Arterial: 282 mmHg — ABNORMAL HIGH (ref 83.0–108.0)

## 2020-10-13 LAB — CBC
HCT: 32.3 % — ABNORMAL LOW (ref 36.0–46.0)
Hemoglobin: 10 g/dL — ABNORMAL LOW (ref 12.0–15.0)
MCH: 26.8 pg (ref 26.0–34.0)
MCHC: 31 g/dL (ref 30.0–36.0)
MCV: 86.6 fL (ref 80.0–100.0)
Platelets: 262 10*3/uL (ref 150–400)
RBC: 3.73 MIL/uL — ABNORMAL LOW (ref 3.87–5.11)
RDW: 16.7 % — ABNORMAL HIGH (ref 11.5–15.5)
WBC: 10.6 10*3/uL — ABNORMAL HIGH (ref 4.0–10.5)
nRBC: 0 % (ref 0.0–0.2)

## 2020-10-13 LAB — GLUCOSE, CAPILLARY
Glucose-Capillary: 133 mg/dL — ABNORMAL HIGH (ref 70–99)
Glucose-Capillary: 95 mg/dL (ref 70–99)
Glucose-Capillary: 173 mg/dL — ABNORMAL HIGH (ref 70–99)
Glucose-Capillary: 108 mg/dL — ABNORMAL HIGH (ref 70–99)
Glucose-Capillary: 126 mg/dL — ABNORMAL HIGH (ref 70–99)
Glucose-Capillary: 208 mg/dL — ABNORMAL HIGH (ref 70–99)

## 2020-10-13 LAB — BASIC METABOLIC PANEL
Anion gap: 8 (ref 5–15)
BUN: 13 mg/dL (ref 8–23)
CO2: 28 mmol/L (ref 22–32)
Calcium: 10.7 mg/dL — ABNORMAL HIGH (ref 8.9–10.3)
Chloride: 103 mmol/L (ref 98–111)
Creatinine, Ser: 0.39 mg/dL — ABNORMAL LOW (ref 0.44–1.00)
GFR, Estimated: >60 mL/min (ref >60)
Glucose, Bld: 108 mg/dL — ABNORMAL HIGH (ref 70–99)
Potassium: 4.4 mmol/L (ref 3.5–5.1)
Sodium: 139 mmol/L (ref 135–145)

## 2020-10-13 LAB — MAGNESIUM: Magnesium: 2 mg/dL (ref 1.7–2.4)

## 2020-10-13 MED ORDER — MAGNESIUM SULFATE 2 GM/50ML IV SOLN
2.0000 g | Freq: Once | INTRAVENOUS | Status: AC
  Administered 2020-10-14: 2 g via INTRAVENOUS
  Filled 2020-10-13: qty 50

## 2020-10-13 MED ORDER — ALBUTEROL SULFATE (2.5 MG/3ML) 0.083% IN NEBU: 10.0000 mg | INHALATION_SOLUTION | Freq: Once | RESPIRATORY_TRACT | Status: AC

## 2020-10-13 MED ORDER — DEXMEDETOMIDINE HCL IN NACL 400 MCG/100ML IV SOLN
0.2000 ug/kg/h | INTRAVENOUS | Status: DC
  Administered 2020-10-14 (×2): 0.4 ug/kg/h via INTRAVENOUS
  Filled 2020-10-13 (×2): qty 100

## 2020-10-13 MED ORDER — METHYLPREDNISOLONE SODIUM SUCC 125 MG IJ SOLR
125.0000 mg | Freq: Once | INTRAMUSCULAR | Status: AC
  Administered 2020-10-13: 125 mg via INTRAVENOUS
  Filled 2020-10-13: qty 2

## 2020-10-13 MED ORDER — BACLOFEN 1 MG/ML ORAL SUSPENSION
5.0000 mg | Freq: Three times a day (TID) | ORAL | Status: DC
  Administered 2020-10-13 – 2021-01-27 (×318): 5 mg
  Filled 2020-10-13 (×333): qty 5

## 2020-10-13 MED ORDER — NUTRISOURCE FIBER PO PACK
1.0000 | PACK | Freq: Two times a day (BID) | ORAL | Status: DC
  Administered 2020-10-13 – 2021-01-27 (×213): 1
  Filled 2020-10-13 (×216): qty 1

## 2020-10-13 MED ORDER — TIZANIDINE HCL 2 MG PO TABS
2.0000 mg | ORAL_TABLET | Freq: Every day | ORAL | Status: DC
  Administered 2020-10-13 – 2021-01-26 (×106): 2 mg
  Filled 2020-10-13 (×107): qty 1

## 2020-10-13 MED ORDER — LORAZEPAM 2 MG/ML IJ SOLN
1.0000 mg | Freq: Once | INTRAMUSCULAR | Status: AC
  Administered 2020-10-13: 1 mg via INTRAVENOUS
  Filled 2020-10-13: qty 1

## 2020-10-13 MED ORDER — ALBUTEROL SULFATE (2.5 MG/3ML) 0.083% IN NEBU
INHALATION_SOLUTION | RESPIRATORY_TRACT | Status: AC
  Administered 2020-10-13: 10 mg via RESPIRATORY_TRACT
  Filled 2020-10-13: qty 12

## 2020-10-13 NOTE — Progress Notes (Signed)
NAME:  Sonia Drake, MRN:  [id number], DOB:  [birth year], LOS: 12 ADMISSION DATE:  10/02/2020, CONSULTATION DATE:  6/15 REFERRING MD:  Thedore Mins, CHIEF COMPLAINT:  Dyspnea   History of Present Illness:  65 y/o female with anoxic brain injury, tracheostomy status who was admitted from a SNF on 6/12 with worsening respiratory failure.  She had previously been ventilator dependent but had been weaned form the trach.  On 6/16 a bronchoscopy revealed dynamic collapse of her trachea, tracheostomy was replaced.  Later required mechanical ventilation.   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 No distress. WBC ct trending down. No events overnight growing GPC clusters in two of two cultures  6/16 increased work of breathing, upper airway noise, abdominal muscle use.  Bronchoscopy performed as trach exchange cuffless #8 Shiley XLT showed dynamic airway collapse, tracheal collapse. 6/16 continued respiratory distress moved to ICU, changed to cuffed #6 Shiley XLT, placed on MV with some improved comfort 6/12 cefepime > 6/14, 6/18 6/12 vanc > 6/14, 6/18  6/23 mucus plugging, severe vent dyssynchrony  Interim History / Subjective:  No events. Poorly responsive on vent.  Objective   Blood pressure 110/88, pulse 91, temperature 97.9 F (36.6 C), temperature source Oral, resp. rate 14, height 5\' 2"  (1.575 m), weight 85.8 kg, SpO2 100 %.    Vent Mode: PRVC FiO2 (%):  [28 %] 28 % Set Rate:  [16 bmp] 16 bmp Vt Set:  [400 mL] 400 mL PEEP:  [5 cmH20]  5 cmH20 Plateau Pressure:  [17 cmH20-34 cmH20] 19 cmH20   Intake/Output Summary (Last 24 hours) at 10/13/2020 1217 Last data filed at 10/13/2020 0800 Gross per 24 hour  Intake 900 ml  Output 600 ml  Net 300 ml    Filed Weights   10/10/20 0500 10/11/20 0600 10/13/20 0500  Weight: 88.4 kg 88.4 kg 85.8 kg    Examination:  No acute distress Chronically ill appearing Triggers vent, rhonci throughout Mild global anasarca PEG in place Does not follow any commands for me  Labs/imaging that I havepersonally reviewed  (right click and "Reselect all SmartList Selections" daily)  6/19 CXR images personally reviewed> no infiltrate, tracheostomy in place Wbc 15 Resp cx 6/13 >> pseudomonas + klebsiella (? Colonizers) Blood 6/12 >> 2 of 2 S. Epi + S. 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 [facility]  unfortunately best option here - Remainder of care per primary  Best Practice (right click and "Reselect all SmartList Selections" daily)  Per primary  Will check on again Monday  Thursday  MD PCCM

## 2020-10-13 NOTE — Progress Notes (Addendum)
eLink Physician-Brief Progress Note Patient Name: Sonia Drake DOB: 1955-09-14 MRN: [id number]   Date of Service  10/13/2020  HPI/Events of Note  ABG: worsening type 2 failure, stable PH. [facility] gave mag 2 gm IV for spasm, earlier we gave albuterol nebs 10 mg.  Still air entry diminished.  Camera: 400/09/05/26%.   Discussed with RT. Not moving air. P peak elevated. Ve 6.5 lit  Bagging tried- no resistance to bagging to suggest any mucous plugs,   eICU Interventions  Start precedex gtt to calm resp tugging, could worsen tracheo bronchomalacia and increasing pco2. Follow ABG. Prn trach suctioning.VS stable otherwise.   Wean fio2 to keep sats < 95%/COPD. Lung protective ventilation.   CxR stat.      Intervention Category Intermediate Interventions: Respiratory distress - evaluation and management;Diagnostic test evaluation  Ranee Gosselin 10/13/2020, 11:49 PM  01:34 Camera re eval done. More comfortable with improvement of PIP and Ve > 8.5 lit on precedex gtt. CCM, MD notes reviewed. Prn ABG.   02:32 ABG: improvong hypercarbia, post sedation. Continue care.

## 2020-10-13 NOTE — TOC Progression Note (Signed)
Transition of Care [facility]) - Progression Note    Patient Details  Name: [name] MRN: [id number] Date of Birth: 03-25-1956  Transition of Care [facility]) CM/SW Contact  Epifanio Lesches, RN Phone Number: 10/13/2020, 8:34 AM  Clinical Narrative:    NCM made referral to [facility] [phone]), [facility]  [phone]) and [facility] [phone]) for SNF /vent. Placement. Clinicals faxed out. Awaiting acceptance determination.  TOC team monitoring and will assist with needs.   Expected Discharge Plan: Skilled Nursing Facility Barriers to Discharge: No SNF bed, Insurance Authorization  Expected Discharge Plan and Services Expected Discharge Plan: Skilled Nursing Facility   Discharge Planning Services: CM Consult                                           Social Determinants of Health (SDOH) Interventions    Readmission Risk Interventions No flowsheet data found.

## 2020-10-13 NOTE — Progress Notes (Addendum)
eLink Physician-Brief Progress Note Patient Name: Sonia Drake DOB: 1955/10/21 MRN: [id number]   Date of Service  10/13/2020  HPI/Events of Note  RN reports pt. appears to be holding breath, not moving air.  No prn meds ordered.  RN asking you to assess  Camera: Double stacking, occasional tugging of trach with stable HR and MAP. Sats > 95%.   eICU Interventions  - get ABG - ativan IV      Intervention Category Intermediate Interventions: Respiratory distress - evaluation and management  Ranee Gosselin 10/13/2020, 11:19 PM

## 2020-10-13 NOTE — Progress Notes (Signed)
Palliative Medicine Inpatient Follow Up Note  Reason for consult:  Goals of Care "anoxic brain injury, Trach - PEG - GOC"   HPI:  Per intake H&P --> Sonia Drake is a 65 y.o. female with medical history significant of asthma/COPD, CHF, cardiac arrest with PEA/CPR with chronic respiratory failure on permanent trach with supplemental oxygen at 6L,  type 2 DM, GERD, seizures and severe hypoxic-ischemic encephalopathy who presented to ER via EMS for respiratory distress from SNF.   Palliative care has been asked to discuss goals of care in the setting of multiple chronic debilities.   Today's Discussion (10/13/2020):  *Please note that this is a verbal dictation therefore any spelling or grammatical errors are due to the "Dragon Medical One" system interpretation.  Chart reviewed. It appears Sonia Drake has undergone multiple acute events throughout the week in the setting of her tracheobronchomalacia. Reviewed that she is now ventilator dependent.   I spoke to patients daughter, [name] over the telephone this afternoon. We reviewed how the week had gone for [facility]. 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 [name] shares that she does not believe this and that "medical doctors don't know", I tried to explore these thoughts more though I was quickly shut down with open ended questions. [name] shares that she would like to continue with full aggressive measures to maintain her mothers life.   __________________________________________________ Addendum:  I was able to secure chart the health team with the above information. Unfortunately, this is a situation where the Palliative team is not able to make an impact. We will sign off though we are happy to be re-consulted if goals change.   Objective Assessment: Vital Signs Vitals:   10/13/20 1214 10/13/20 1300  BP: 110/88 129/79  Pulse: 91 90  Resp: 14 17   Temp:    SpO2: 100% 98%    Intake/Output Summary (Last 24 hours) at 10/13/2020 1359 Last data filed at 10/13/2020 0800 Gross per 24 hour  Intake 855 ml  Output 600 ml  Net 255 ml    Last Weight  Most recent update: 10/13/2020  6:32 AM    Weight  85.8 kg (189 lb 2.5 oz)            Gen:  Very ill appearing AA F  HEENT: Tracheostomy, Dry mucous membranes CV: Regular rate and rhythm PULM: On vent ABD: G tube in place EXT: Gen edema Neuro: Opens eyes doesn't follow commands  SUMMARY OF RECOMMENDATIONS   FULL CODE/FULL SCOPE   Patients daughter was very clear that care would not be de-escalated unless other organ systems (kidneys/liver) started shutting down as per her mothers wishes. This will likely be a situation where limits will need to be set if continued deterioration occurs.    Ongoing incremental support given that goals are clear - We will sign off presently. Pls re-consult if goals change  Time Spent: 35 Greater than 50% of the time was spent in counseling and coordination of care ______________________________________________________________________________________ Lamarr Lulas [facility] Palliative Medicine Team Team Cell Phone: [phone] Please utilize secure chat with additional questions, if there is no response within 30 minutes please call the above phone number  Palliative Medicine Team providers are available by phone from 7am to 7pm daily and can be reached through the team cell phone.  Should this patient require assistance outside of these hours, please call the patient's  attending physician.

## 2020-10-13 NOTE — Progress Notes (Addendum)
[facility] PROGRESS NOTE  Sonia Drake:[id number] DOB: [birth year] DOA: 09/23/2020 PCP: Eloisa Northern, MD  Status: Remains inpatient appropriate because:Altered mental status, Unsafe d/c plan, and Inpatient level of care appropriate due to severity of illness  Dispo: The patient is from: SNF              Anticipated d/c is to: SNF Ventilator capable              Patient currently is not medically stable to d/c.   Difficult to place patient Yes   Level of care: ICU  Code Status: Full Family Communication:  DVT prophylaxis: Lovenox COVID vaccination status: Per H&P patient is not vaccinated    HPI: 65 y/o female with medical history significant of asthma/COPD, CHF, cardiac arrest with PEA/CPR with chronic respiratory failure on permanent trach with supplemental oxygen at 6L, type 2 DM, GERD, seizures and severe hypoxic-ischemic encephalopathy who presented to ER via EMS for respiratory distress from SNF.  She presented with findings consistent with acute aspiration pneumonitis.  She decompensated after arrival and required transfer to ICU and initiation of mechanical ventilation.  She had sepsis physiology resumed secondary to [facility] bacteremia but it was later determined that these blood culture results were related to contaminants and patient was no longer determined to be septic   She had previously been ventilator dependent but had been weaned from the ventilator. On 6/16 a bronchoscopy revealed dynamic collapse of her trachea, tracheostomy was changed from 8.0 XLT cuffless to 6.0 XLT cuffed. She now is requiring chronic ventilatory support 2/2 advanced COPD/asthma and tracheobronchomalacia. PCCM physicians updated patient's daughter on status and poor prognosis along with recommendation for chronic vent.  Palliative medicine has had multiple discussions with the daughter.  Daughter continues to request aggressive care.  Subjective: Patient only opens eyes to tactile  stimulation.  Has no purposeful interaction.  No family at bedside.  Objective: Vitals:   10/13/20 0734 10/13/20 0736  BP:    Pulse:    Resp:    Temp:    SpO2: 100% 100%    Intake/Output Summary (Last 24 hours) at 10/13/2020 0748 Last data filed at 10/13/2020 0700 Gross per 24 hour  Intake 1080 ml  Output 875 ml  Net 205 ml   Filed Weights   10/10/20 0500 10/11/20 0600 10/13/20 0500  Weight: 88.4 kg 88.4 kg 85.8 kg    Exam:  Constitutional: NAD, nonverbal at baseline and not interactive secondary to known brain injury prior to admission Respiratory: 6.0 Shiley cuffed XLT trach, ventilator settings: VC mode with a VT of 400, rate 6, PEEP 5, FiO2 28% ; lung sounds are coarse to auscultation, increased work of breathing and no excessive secretions have been suctioned per trach Cardiovascular: Regular rate and maintaining sinus rhythm, no murmurs / rubs / gallops. No extremity edema. 2+ pedal pulses.  Abdomen: no tenderness, no masses palpated. Bowel sounds positive.  PEG tube for feedings. LBM 6/24 noting Flexi-Seal in place with about 200 cc brown drainage noted Musculoskeletal: Patient has increased muscle tone of the head, neck and extremities consistent with hypertonicity.  No spasticity or clonus reproducible with PROM of extremities Skin: Stage II decubitus of sacrum-not examined today Neurologic: Exam limited by patient's inability to participate secondary to chronic brain injury.  Patient open eyes to stimulation without tracking.  Has unequal mildly dilated pupils with left 3 mm and right 4 mm that are reactive equally.  No spontaneous movement but does have hypertonicity as  documented above. Psychiatric: Only opens eyes to stimulation without any tracking.  Nonverbal.   Assessment/Plan: Acute problems: Acute on chronic respiratory failure w/ hypoxemia Tracheostomy/vent dependence 2/2 Tracheobronchomalcia and advanced COPD/asthma Not a candidate to wean from ventilator or  decannulation Continue chest PT and bronchodilators, tracheal suction as needed PCCM physician spoke at length with daughter about chronicity of these medical problems and that patient will always be ventilator dependent.  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 Begin baclofen per tube TID and Zanaflex per tube HS  Seizure disorder In review of outpatient notes this was present prior to her cardiac arrest Continue AEDs for seizure prophylaxis  ?CAD Review of outpatient cardiology documents from 09/08/2019 revealed no evidence of CAD Patient was treated for cardiac arrest in August 2021 at [facility].  According to EMS records during that admission patient called 911 in context of respiratory failure.  She was gasping for air and then became unresponsive.  Upon EMS arrival patient was pulseless and apneic. I cannot find any documentation of known CAD in her outside records and it is unclear why she is on Plavix   Abnormal blood cultures -Organisms consistent with contaminants -Repeat blood cultures negative   Hyponatremia Resolved after holding free water current sodium 139  Diabetes mellitus 2 on long-term insulin with hyperglycemia Continue preadmission Lantus Regular insulin tube feeding coverage along with SSI Hemoglobin A1c 7.0 Current CBGs well-controlled   Hypertension Blood pressure well controlled on low-dose  beta-blocker   Chronic diastolic dysfunction Echocardiogram this admission with grade 1 diastolic dysfunction, moderate LV hypertrophy hyperdynamic LV systolic function Currently euvolemic Monitor I/O Continue beta-blocker Not on ACE or ARB secondary to suboptimal blood pressure reading   Chronic rhinitis with incidental finding on imaging of acute left sphenoid sinusitis Continue singulair and claritin CT completed overnight reveals acute sphenoid sinusitis She is afebrile and CBC trending downward no indication to initiate antibiotics   Chronic protein malnutrition secondary to sequelae from anoxic brain injury/PEG tube dependence Continue current tube feedings with Prosource No free water secondary to hyponatremia and administer either saline or diluted juice or cola if necessary to maintain hydration Nutrition Status: Nutrition Problem: Increased nutrient needs Etiology: wound healing Signs/Symptoms: estimated needs Interventions: Tube feeding Body mass index is 34.6 kg/m.   Diarrhea Has Flexi-Seal in place Discontinue scheduled Colace Add fiber supplement in addition to Jevity with fiber  Stage II sacral decubitus Pressure Injury 10/02/20 Buttocks Bilateral Stage 2 -  Partial thickness loss of dermis presenting as a shallow open injury with a red, pink wound bed without slough. (Active)  Date First Assessed/Time First Assessed: 10/02/20 0300   Location: Buttocks  Location Orientation: Bilateral  Staging: Stage 2 -  Partial thickness loss of dermis presenting as a shallow open injury with a red, pink wound bed without slough.  Present ...    Assessments 10/02/2020  4:17 AM 10/13/2020  5:00 AM  Dressing Type Foam - Lift dressing to assess site every shift Foam - Lift dressing to  assess site every shift  Dressing Clean;Dry;Intact Clean;Dry;Intact;Changed  Dressing Change Frequency -- Daily  State of Healing -- Early/partial granulation  Site / Wound Assessment Bleeding;Pink;Red  Dry;Clean;Bleeding  Peri-wound Assessment -- Pink  Margins -- Unattached edges (unapproximated)  Drainage Amount Scant Scant  Drainage Description Sanguineous Serosanguineous  Treatment Cleansed;Off loading --     No Linked orders to display       Data Reviewed: Basic Metabolic Panel: Recent Labs  Lab 10/07/20 0106 10/08/20 0610 10/09/20 0159 10/10/20 0148 10/11/20 0103 10/12/20 0204 10/13/20 0131  NA 141 136 136 131* 135 140 139  K 4.1 4.4 3.8 3.8 4.0 4.0 4.4  CL 103 103 98 96* 99 103 103  CO2 31 25 29 28 27 31 28   GLUCOSE 214* 184* 144* 226* 188* 192* 108*  BUN 20 15 17 18 14 13 13   CREATININE 0.41* 0.38* 0.41* 0.48 0.41* 0.42* 0.39*  CALCIUM 10.6* 10.7* 10.4* 9.7 10.3 10.4* 10.7*  MG 1.9  --   --   --   --  1.9 2.0  PHOS 1.9* 2.1*  --   --   --   --   --    Liver Function Tests: Recent Labs  Lab 10/08/20 0610 10/09/20 0159 10/10/20 0148  AST 18 16 16   ALT 29 27 22   ALKPHOS 83 81 74  BILITOT 0.6 0.6 0.2*  PROT 6.4* 6.2* 5.2*  ALBUMIN 3.0* 3.0* 2.3*   No results for input(s): LIPASE, AMYLASE in the last 168 hours. No results for input(s): AMMONIA in the last 168 hours. CBC: Recent Labs  Lab 10/08/20 0610 10/09/20 0159 10/10/20 0148 10/11/20 0103 10/12/20 0204 10/13/20 0131  WBC 19.0* 15.4* 12.1* 14.2* 11.7* 10.6*  NEUTROABS 13.7* 9.4* 7.3  --   --   --   HGB 12.0 12.0 11.0* 11.3* 10.2* 10.0*  HCT 38.0 38.1 34.9* 36.2 32.6* 32.3*  MCV 85.6 84.5 86.0 86.2 86.9 86.6  PLT 394 344 285 288 286 262   Cardiac Enzymes: No results for input(s): CKTOTAL, CKMB, CKMBINDEX, TROPONINI in the last 168 hours. BNP (last 3 results) Recent Labs    10/04/20 0051 10/05/20 0049 10/06/20 0548  BNP 145.8* 115.3* 125.4*    ProBNP (last 3 results) No results for input(s): PROBNP in the last 8760 hours.  CBG: Recent Labs  Lab 10/12/20 1502 10/12/20 1913 10/12/20 2315 10/13/20 0319 10/13/20 0710  GLUCAP 152* 124* 153* 133* 95    Recent Results (from the  past 240 hour(s))  MRSA PCR Screening     Status: Abnormal   Collection Time: 10/05/20  5:07 PM  Result Value Ref Range Status   MRSA by PCR POSITIVE (A) NEGATIVE Final    Comment:        The GeneXpert MRSA Assay (FDA approved for NASAL specimens only), is one component of a comprehensive MRSA colonization surveillance program. It is not intended to diagnose MRSA infection nor to guide or monitor treatment for MRSA infections. RESULT CALLED TO, READ BACK BY AND VERIFIED WITHMargo Aye: D KYEI RN [id number] 10/05/20 A BROWNING Performed at [facility], [address] [place], [place] [id number]   Culture, blood (Routine X 2) w Reflex to ID Panel     Status: None   Collection Time: 10/07/20  9:37 AM   Specimen: BLOOD LEFT HAND  Result Value Ref Range Status   Specimen Description BLOOD LEFT HAND  Final   Special Requests   Final    BOTTLES DRAWN AEROBIC ONLY Blood  Culture results may not be optimal due to an inadequate volume of blood received in culture bottles   Culture   Final    NO GROWTH 5 DAYS Performed at [facility], [address], [place], [place] [address]    Report Status 10/12/2020 FINAL  Final  Culture, blood (Routine X 2) w Reflex to ID Panel     Status: None   Collection Time: 10/07/20  9:45 AM   Specimen: BLOOD RIGHT HAND  Result Value Ref Range Status   Specimen Description BLOOD RIGHT HAND  Final   Special Requests   Final    BOTTLES DRAWN AEROBIC ONLY Blood Culture adequate volume   Culture   Final    NO GROWTH 5 DAYS Performed at [facility], [address], [place], [place] [address]    Report Status 10/12/2020 FINAL  Final     Studies: CT HEAD WO CONTRAST  Result Date: 10/13/2020 CLINICAL DATA:  Initial evaluation for neuro deficit, stroke suspected. EXAM: CT HEAD WITHOUT CONTRAST TECHNIQUE: Contiguous axial images were obtained from the base of the skull through the vertex without intravenous contrast. COMPARISON:  Prior CT from 12/18/2019.  FINDINGS: Brain: Examination degraded by motion and patient positioning. Diffuse prominence of the CSF containing spaces consistent with atrophy, progressed as compared to previous exams. Patchy hypodensity involving the periventricular and deep white matter both cerebral hemispheres most likely related to chronic microvascular ischemic disease, also progressed. No acute intracranial hemorrhage. No definite or visible acute large vessel territory infarct. Apparent vague hypodensity involving the left occipital region favored to be artifactual related to motion and/or positioning. No mass lesion or midline shift. No hydrocephalus or visible extra-axial fluid collection. Vascular: No hyperdense vessel. Skull: Scalp soft tissues demonstrate no acute finding. Calvarium intact. Sinuses/Orbits: Globes and orbital soft tissues demonstrate no acute finding. Layering fluid noted within the left sphenoid sinus. Scattered mucosal thickening noted within the ethmoidal air cells and maxillary sinuses. Trace left mastoid effusion noted. Other: None. IMPRESSION: 1. Motion degraded exam. No definite acute intracranial abnormality identified. 2. Generalized age-related cerebral atrophy with chronic small vessel ischemic disease, progressed as compared to previous exams. Finding presumably related to history of prior cardiac arrest with subsequent hypoxic ischemic injury. 3. Acute left sphenoid sinusitis. Electronically Signed   By: Rise Mu M.D.   On: 10/13/2020 02:18   DG CHEST PORT 1 VIEW  Result Date: 10/12/2020 CLINICAL DATA:  Acute respiratory failure, hypoxemia EXAM: PORTABLE CHEST 1 VIEW COMPARISON:  10/08/2020 FINDINGS: The heart size and mediastinal contours are within normal limits. Tracheostomy in appropriate position. Both lungs are clear. The visualized skeletal structures are unremarkable. IMPRESSION: 1. No acute abnormality of the lungs in AP portable projection. 2. Tracheostomy in appropriate  position. Electronically Signed   By: Lauralyn Primes M.D.   On: 10/12/2020 09:21    Scheduled Meds:  arformoterol  15 mcg Nebulization BID   budesonide  0.5 mg Nebulization BID   chlorhexidine  15 mL Mouth/Throat BID   Chlorhexidine Gluconate Cloth  6 each Topical Daily   clonazePAM  0.5 mg Per Tube BID   clopidogrel  75 mg Per Tube Daily   docusate  100 mg Per Tube BID   enoxaparin (LOVENOX) injection  0.5 mg/kg Subcutaneous Q24H   famotidine  20 mg Per Tube BID   feeding supplement (PROSource TF)  45 mL Per Tube BID   guaiFENesin  5 mL Per Tube Q6H   insulin aspart  0-15  Units Subcutaneous Q4H   insulin aspart  3 Units Subcutaneous Q4H   insulin glargine  40 Units Subcutaneous QHS   levETIRAcetam  2,500 mg Per Tube BID   loratadine  10 mg Per Tube Daily   mouth rinse  15 mL Mouth Rinse 10 times per day   metoprolol tartrate  25 mg Per Tube BID   montelukast  10 mg Per Tube Daily   polyethylene glycol  17 g Per Tube Daily   potassium chloride  20 mEq Per Tube Daily   revefenacin  175 mcg Nebulization Daily   senna-docusate  1 tablet Per Tube q AM   Continuous Infusions:  feeding supplement (JEVITY 1.5 CAL/FIBER) 1,000 mL (10/12/20 0645)    Principal Problem:   Sepsis (HCC) Active Problems:   COPD (chronic obstructive pulmonary disease) (HCC)   Chronic diastolic CHF (congestive heart failure) (HCC)   Acute on chronic respiratory failure with hypoxia (HCC)   GERD (gastroesophageal reflux disease)   Seizures (HCC)   Type 2 diabetes mellitus without complication, with long-term current use of insulin (HCC)   Severe hypoxic-ischemic encephalopathy   Tracheostomy dependence (HCC)   Pressure injury of skin   Consultants: PCCM Palliative medicine Neurology  Procedures: Echocardiogram Replacement of trach  Antibiotics: Cefepime 6/12 through 6/14 Flagyl 6/12 through 6/14 Vancomycin 6/12 through 6/14 Cefepime x1 dose 6/18 Vancomycin x2 doses 6/18, 6/19   Time spent:  45 minutes    Junious Silk ANP  [facility] 7 am - 330 pm/M-F for direct patient care and secure chat Please refer to Amion for contact info 12  days

## 2020-10-13 NOTE — Progress Notes (Signed)
eLink Physician-Brief Progress Note Patient Name: Sonia Drake DOB: [birth year] MRN: [id number]   Date of Service  10/13/2020  HPI/Events of Note  RT asking for albuterol 10 mg once for breath stacking. Prn 2 mg albuterol helps.  Camera: Sinus tachy 112. Sats 98%.  PIP 30.  S/p emergent bronch on 23 rd  Tracheo broncheomalacia. Suctioning done.   eICU Interventions  Albuterol 10 mg once ordered, watch for arhythmiaK level > 4.       Intervention Category Intermediate Interventions: Respiratory distress - evaluation and management  Ranee Gosselin 10/13/2020, 8:14 PM

## 2020-10-13 NOTE — Progress Notes (Signed)
[facility] night shift.  Sonia Drake. Kessenich is a 65 year old female with a past medical history of anoxic encephalopathy since August 2021 following a PEA arrest, tracheostomy dependent, type II DM, GERD, seizure disorder who was admitted on 2020/10/25 from a skilled nursing facility due to acute on chronic respiratory failure.  Tonight, the nursing staff reported earlier that the patient's left-sided facial droop looks a lot more pronounced with anisocoria of 4 mm on OD and 5 mm on OS.  The nursing staff told me that she has had mild anisocoria previously.  When evaluated at bedside, left facial droop is very obvious now.  When she was seen by Dr. Otelia Limes on 10/02/2020 he described that her face was symmetric with occasional nonrhythmic spasms.  I was unable to evaluate her mental status as she is awake, but in a vegetative state.  Extremities are flaccid.  Assessment: Left-sided facial droop. Obtain stat CT head without contrast.  Sanda Klein, MD.

## 2020-10-13 NOTE — Progress Notes (Signed)
Pt transported from 2M02 to CT and back with no complications.  

## 2020-10-14 ENCOUNTER — Inpatient Hospital Stay (HOSPITAL_COMMUNITY): Payer: Medicare Other

## 2020-10-14 DIAGNOSIS — G40409 Other generalized epilepsy and epileptic syndromes, not intractable, without status epilepticus: Secondary | ICD-10-CM

## 2020-10-14 LAB — POCT I-STAT 7, (LYTES, BLD GAS, ICA,H+H)
Acid-Base Excess: 1 mmol/L (ref 0.0–2.0)
Bicarbonate: 26 mmol/L (ref 20.0–28.0)
Calcium, Ion: 1.45 mmol/L — ABNORMAL HIGH (ref 1.15–1.40)
HCT: 29 % — ABNORMAL LOW (ref 36.0–46.0)
Hemoglobin: 9.9 g/dL — ABNORMAL LOW (ref 12.0–15.0)
O2 Saturation: 98 %
Potassium: 4.3 mmol/L (ref 3.5–5.1)
Sodium: 137 mmol/L (ref 135–145)
TCO2: 27 mmol/L (ref 22–32)
pCO2 arterial: 42.9 mmHg (ref 32.0–48.0)
pH, Arterial: 7.391 (ref 7.350–7.450)
pO2, Arterial: 111 mmHg — ABNORMAL HIGH (ref 83.0–108.0)

## 2020-10-14 LAB — BASIC METABOLIC PANEL
Anion gap: 10 (ref 5–15)
BUN: 15 mg/dL (ref 8–23)
CO2: 25 mmol/L (ref 22–32)
Calcium: 10.4 mg/dL — ABNORMAL HIGH (ref 8.9–10.3)
Chloride: 101 mmol/L (ref 98–111)
Creatinine, Ser: 0.48 mg/dL (ref 0.44–1.00)
GFR, Estimated: >60 mL/min (ref >60)
Glucose, Bld: 217 mg/dL — ABNORMAL HIGH (ref 70–99)
Potassium: 4 mmol/L (ref 3.5–5.1)
Sodium: 136 mmol/L (ref 135–145)

## 2020-10-14 LAB — CBC
HCT: 31 % — ABNORMAL LOW (ref 36.0–46.0)
Hemoglobin: 9.7 g/dL — ABNORMAL LOW (ref 12.0–15.0)
MCH: 27.1 pg (ref 26.0–34.0)
MCHC: 31.3 g/dL (ref 30.0–36.0)
MCV: 86.6 fL (ref 80.0–100.0)
Platelets: 273 10*3/uL (ref 150–400)
RBC: 3.58 MIL/uL — ABNORMAL LOW (ref 3.87–5.11)
RDW: 16.4 % — ABNORMAL HIGH (ref 11.5–15.5)
WBC: 8.5 10*3/uL (ref 4.0–10.5)
nRBC: 0 % (ref 0.0–0.2)

## 2020-10-14 LAB — GLUCOSE, CAPILLARY
Glucose-Capillary: 312 mg/dL — ABNORMAL HIGH (ref 70–99)
Glucose-Capillary: 275 mg/dL — ABNORMAL HIGH (ref 70–99)
Glucose-Capillary: 320 mg/dL — ABNORMAL HIGH (ref 70–99)
Glucose-Capillary: 238 mg/dL — ABNORMAL HIGH (ref 70–99)
Glucose-Capillary: 201 mg/dL — ABNORMAL HIGH (ref 70–99)
Glucose-Capillary: 180 mg/dL — ABNORMAL HIGH (ref 70–99)

## 2020-10-14 LAB — MAGNESIUM: Magnesium: 2.1 mg/dL (ref 1.7–2.4)

## 2020-10-14 MED ORDER — PROPOFOL 1000 MG/100ML IV EMUL
20.0000 ug/kg/min | INTRAVENOUS | Status: DC
  Administered 2020-10-14: 40 ug/kg/min via INTRAVENOUS
  Administered 2020-10-15 – 2020-10-16 (×5): 20 ug/kg/min via INTRAVENOUS
  Filled 2020-10-14 (×7): qty 100

## 2020-10-14 MED ORDER — LACTATED RINGERS IV BOLUS
1000.0000 mL | Freq: Once | INTRAVENOUS | Status: AC | PRN
  Administered 2020-10-14: 1000 mL via INTRAVENOUS

## 2020-10-14 MED ORDER — SODIUM CHLORIDE 0.9 % IV SOLN
250.0000 mL | INTRAVENOUS | Status: DC
  Administered 2020-10-14: 250 mL via INTRAVENOUS

## 2020-10-14 MED ORDER — SODIUM CHLORIDE 0.9 % IV SOLN
250.0000 mL | INTRAVENOUS | Status: DC
  Administered 2020-12-21 – 2020-12-29 (×3): 250 mL via INTRAVENOUS

## 2020-10-14 MED ORDER — PROPOFOL 1000 MG/100ML IV EMUL: 5.0000 ug/kg/min | INTRAVENOUS | Status: DC

## 2020-10-14 MED ORDER — LORAZEPAM 2 MG/ML IJ SOLN
INTRAMUSCULAR | Status: AC
  Filled 2020-10-14: qty 1

## 2020-10-14 MED ORDER — LORAZEPAM 2 MG/ML IJ SOLN
2.0000 mg | INTRAMUSCULAR | Status: DC | PRN
  Administered 2020-10-14: 2 mg via INTRAVENOUS

## 2020-10-14 MED ORDER — NOREPINEPHRINE 4 MG/250ML-% IV SOLN
2.0000 ug/min | INTRAVENOUS | Status: DC
  Administered 2020-10-14: 2 ug/min via INTRAVENOUS
  Administered 2020-10-15: 7 ug/min via INTRAVENOUS
  Administered 2020-10-15: 5 ug/min via INTRAVENOUS
  Administered 2020-10-16: 10 ug/min via INTRAVENOUS
  Filled 2020-10-14 (×4): qty 250

## 2020-10-14 MED ORDER — INSULIN ASPART 100 UNIT/ML IJ SOLN
5.0000 [IU] | INTRAMUSCULAR | Status: DC
  Administered 2020-10-14 – 2020-11-11 (×154): 5 [IU] via SUBCUTANEOUS

## 2020-10-14 MED ORDER — ALBUTEROL SULFATE (2.5 MG/3ML) 0.083% IN NEBU
2.5000 mg | INHALATION_SOLUTION | Freq: Four times a day (QID) | RESPIRATORY_TRACT | Status: DC
  Administered 2020-10-14 – 2020-10-19 (×20): 2.5 mg via RESPIRATORY_TRACT
  Filled 2020-10-14 (×20): qty 3

## 2020-10-14 MED ORDER — VALPROATE SODIUM 100 MG/ML IV SOLN
1500.0000 mg | Freq: Once | INTRAVENOUS | Status: AC
  Administered 2020-10-14: 1500 mg via INTRAVENOUS
  Filled 2020-10-14: qty 15

## 2020-10-14 MED ORDER — VALPROATE SODIUM 100 MG/ML IV SOLN
500.0000 mg | Freq: Two times a day (BID) | INTRAVENOUS | Status: DC
  Administered 2020-10-15 – 2020-10-17 (×5): 500 mg via INTRAVENOUS
  Filled 2020-10-14 (×6): qty 5

## 2020-10-14 MED ORDER — LORAZEPAM 2 MG/ML IJ SOLN
4.0000 mg | Freq: Once | INTRAMUSCULAR | Status: AC
  Administered 2020-10-14: 4 mg via INTRAVENOUS
  Filled 2020-10-14: qty 2

## 2020-10-14 MED ORDER — LORAZEPAM 2 MG/ML IJ SOLN
2.0000 mg | INTRAMUSCULAR | Status: DC | PRN
  Administered 2020-10-14 – 2020-10-20 (×5): 2 mg via INTRAVENOUS
  Filled 2020-10-14 (×5): qty 1

## 2020-10-14 MED ORDER — ALBUTEROL SULFATE (2.5 MG/3ML) 0.083% IN NEBU
2.5000 mg | INHALATION_SOLUTION | RESPIRATORY_TRACT | Status: DC
  Administered 2020-10-14: 2.5 mg via RESPIRATORY_TRACT
  Filled 2020-10-14: qty 3

## 2020-10-14 NOTE — Progress Notes (Signed)
EEG maintenance done. Fixed P7 T7. Continue to monitor

## 2020-10-14 NOTE — Progress Notes (Signed)
NAME:  Sonia Drake, MRN:  [id number], DOB:  06-Jul-1955, LOS: 13 ADMISSION DATE:  10/18/2020, CONSULTATION DATE:  6/15 REFERRING MD:  Thedore Mins, CHIEF COMPLAINT:  Dyspnea   History of Present Illness:  65 y/o female with anoxic brain injury, tracheostomy status who was admitted from a SNF on 6/12 with worsening respiratory failure.  She had previously been ventilator dependent but had been weaned form the trach.  On 6/16 a bronchoscopy revealed dynamic collapse of her trachea, tracheostomy was replaced.  Later required mechanical ventilation.   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 No distress. WBC ct trending down. No events overnight growing GPC clusters in two of two cultures  6/16 increased work of breathing, upper airway noise, abdominal muscle use.  Bronchoscopy performed as trach exchange cuffless #8 Shiley XLT showed dynamic airway collapse, tracheal collapse. 6/16 continued respiratory distress moved to ICU, changed to cuffed #6 Shiley XLT, placed on MV with some improved comfort 6/12 cefepime > 6/14, 6/18 6/12 vanc > 6/14, 6/18  6/23 mucus plugging, severe vent dyssynchrony  Interim History / Subjective:  Lots of issues with agitation associated air trapping and peak pressuring overnight relieved with ativan somewhat  Objective   Blood pressure (!) 86/60, pulse 77, temperature 97.6 F (36.4 C), temperature source Oral, resp. rate 20, height 5\' 2"  (1.575 m), weight 86.3 kg, SpO2 100 %.    Vent Mode: PRVC FiO2 (%):  [28 %]  28 % Set Rate:  [16 bmp-20 bmp] 20 bmp Vt Set:  [400 mL] 400 mL PEEP:  [5 cmH20] 5 cmH20 Plateau Pressure:  [16 cmH20-29 cmH20] 29 cmH20   Intake/Output Summary (Last 24 hours) at 10/14/2020 1039 Last data filed at 10/14/2020 0800 Gross per 24 hour  Intake 1834.89 ml  Output 151 ml  Net 1683.89 ml    Filed Weights   10/11/20 0600 10/13/20 0500 10/14/20 0434  Weight: 88.4 kg 85.8 kg 86.3 kg    Examination: Nearly comatose Lungs with minimal wheezing Double triggering vent Plats 31, Peaks 45  CXR benign  Labs/imaging that I havepersonally reviewed  (right click and "Reselect all SmartList Selections" daily)  6/19 CXR images personally reviewed> no infiltrate, tracheostomy in place Wbc 15 Resp cx 6/13 >> pseudomonas + klebsiella (? Colonizers) Blood 6/12 >> 2 of 2 S. Epi + S. 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 Status epilepticus; but mostly I think it is related to how the distal XLT shiley levers in to the trachea (see photo below).  Would keep towel under trach entry point, use benzodiazepines PRN her seizures vs.  Myoclonus, and place her on continuous EEG monitoring to make sure she is not in subclinical status.  With adjustments to trach entry point peak pressures are now around  18 cmH2O.   Will follow with you  33 minutes CC time  Myrla Halsted MD PCCM

## 2020-10-14 NOTE — Progress Notes (Signed)
[facility] PROGRESS NOTE  Niel HummerMary J Gravelle ZOX:[id number] DOB: December 02, 1955 DOA: 09/29/2020 PCP: Eloisa NorthernAmin, Saad, MD  Status: Remains inpatient appropriate because:Altered mental status, Unsafe d/c plan, and Inpatient level of care appropriate due to severity of illness  Dispo: The patient is from: SNF              Anticipated d/c is to: SNF Ventilator capable              Patient currently is not medically stable to d/c.   Difficult to place patient Yes   Level of care: ICU  Code Status: Full Family Communication:  DVT prophylaxis: Lovenox COVID vaccination status: Per H&P patient is not vaccinated    HPI: 65 y/o female with medical history significant of asthma/COPD, CHF, cardiac arrest with PEA/CPR with chronic respiratory failure on permanent trach with supplemental oxygen at 6L, type 2 DM, GERD, seizures and severe hypoxic-ischemic encephalopathy who presented to ER via EMS for respiratory distress from SNF.  She presented with findings consistent with acute aspiration pneumonitis.  She decompensated after arrival and required transfer to ICU and initiation of mechanical ventilation.  She had sepsis physiology [facility] but it was later determined that these blood culture results were related to contaminants and patient was no longer determined to be septic   She had previously been ventilator dependent but had been weaned from the ventilator. On 6/16 a bronchoscopy revealed dynamic collapse of her trachea, tracheostomy was changed from 8.0 XLT cuffless to 6.0 XLT cuffed. She now is requiring chronic ventilatory support 2/2 advanced COPD/asthma and tracheobronchomalacia. PCCM physicians updated patient's daughter on status and poor prognosis along with recommendation for chronic vent.  Palliative medicine has had multiple discussions with the daughter.  Daughter continues to request aggressive care.  Subjective: Pt had respiratory issues and required  intervention from eLink critical care, started on precedex gtt.    Overnight also had concern about seizure-like activities, neuro involved and LTM EEG started.   Objective: Vitals:   10/14/20 1400 10/14/20 1430  BP: 91/61 103/65  Pulse: (!) 58 66  Resp: 20 20  Temp:    SpO2: 100% 100%    Intake/Output Summary (Last 24 hours) at 10/14/2020 1527 Last data filed at 10/14/2020 1400 Gross per 24 hour  Intake 2150.78 ml  Output 150 ml  Net 2000.78 ml   Filed Weights   10/11/20 0600 10/13/20 0500 10/14/20 0434  Weight: 88.4 kg 85.8 kg 86.3 kg    Exam:  Constitutional: unresponsive HEENT: conjunctivae and lids normal, pin-point pupils  CV: No cyanosis.   RESP: trach on vent GI: PEG tube present.  Rectal tube with liquid brown stool Extremities: No effusions, edema in BLE SKIN: warm, dry   Assessment/Plan: Acute problems: Acute on chronic respiratory failure w/ hypoxemia Tracheostomy/vent dependence 2/2 Tracheobronchomalcia and advanced COPD/asthma Not a candidate to wean from ventilator or decannulation Continue chest PT and bronchodilators, tracheal suction as needed PCCM physician spoke at length with daughter about chronicity of these medical problems and that patient will always be ventilator dependent.  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 Begin baclofen per tube TID and Zanaflex per tube HS  Seizure disorder In review of  outpatient notes this was present prior to her cardiac arrest Continue AEDs for seizure prophylaxis --started on LTM EEG overnight.  ?CAD Review of outpatient cardiology documents from 09/08/2019 revealed no evidence of CAD Patient was treated for cardiac arrest in August 2021 at [facility].  According to EMS records during that admission patient called 911 in context of respiratory failure.  She was gasping for air and then became unresponsive.  Upon EMS arrival patient was pulseless and apneic. I cannot find any documentation of known CAD in her outside records and it is unclear why she is on Plavix   Abnormal blood cultures -Organisms consistent with contaminants -Repeat blood cultures negative   Hyponatremia Resolved after holding free water current sodium 139  Diabetes mellitus 2 on long-term insulin with hyperglycemia Continue preadmission Lantus Regular insulin tube feeding coverage along with SSI Hemoglobin A1c 7.0 Current CBGs well-controlled   Hypertension Blood pressure well controlled on low-dose beta-blocker   Chronic diastolic dysfunction Echocardiogram this admission with grade 1 diastolic dysfunction, moderate LV hypertrophy hyperdynamic LV systolic function Currently euvolemic Monitor I/O Continue beta-blocker Not on ACE or ARB secondary to suboptimal blood pressure reading   Chronic rhinitis with incidental finding on imaging of acute left sphenoid sinusitis Continue singulair and claritin CT completed overnight reveals acute sphenoid sinusitis She is afebrile and CBC trending downward no indication to initiate antibiotics   Chronic protein malnutrition secondary to sequelae from anoxic brain injury/PEG tube dependence Continue current tube feedings with Prosource No free water secondary to hyponatremia and administer either saline or diluted juice or cola if necessary to maintain hydration Nutrition Status: Nutrition Problem: Increased  nutrient needs Etiology: wound healing Signs/Symptoms: estimated needs Interventions: Tube feeding Body mass index is 34.8 kg/m.   Diarrhea Has Flexi-Seal in place Discontinue scheduled Colace Add fiber supplement in addition to Jevity with fiber  Stage II sacral decubitus Pressure Injury 10/02/20 Buttocks Bilateral Stage 2 -  Partial thickness loss of dermis presenting as a shallow open injury with a red, pink wound bed without slough. (Active)  Date First Assessed/Time First Assessed: 10/02/20 0300   Location: Buttocks  Location Orientation: Bilateral  Staging: Stage 2 -  Partial thickness loss of dermis presenting as a shallow open injury with a red, pink wound bed without slough.  Present ...    Assessments 10/02/2020  4:17 AM 10/14/2020  8:00 AM  Dressing Type Foam - Lift dressing to assess site every shift Foam - Lift dressing to assess site every shift  Dressing Clean;Dry;Intact Clean;Dry;Intact;Changed  Dressing Change Frequency -- Daily  State of Healing -- Early/partial granulation  Site / Wound Assessment Bleeding;Pink;Red Clean;Dry;Pink  Peri-wound Assessment -- Pink  Drainage Amount Scant None  Drainage Description Sanguineous --  Treatment Cleansed;Off loading --     No Linked orders to display       Data Reviewed: Basic Metabolic Panel: Recent Labs  Lab 10/08/20 0610 10/09/20 0159 10/10/20 0148 10/11/20 0103 10/12/20 6010 10/13/20 0131 10/13/20 2331 10/14/20 0042 10/14/20 0213  NA 136   < > 131* 135 140 139 140 136 137  K 4.4   < > 3.8 4.0 4.0 4.4 3.9 4.0 4.3  CL 103   < > 96* 99 103 103  --  101  --   CO2  25   < > 28 27 31 28   --  25  --   GLUCOSE 184*   < > 226* 188* 192* 108*  --  217*  --   BUN 15   < > 18 14 13 13   --  15  --   CREATININE 0.38*   < > 0.48 0.41* 0.42* 0.39*  --  0.48  --   CALCIUM 10.7*   < > 9.7 10.3 10.4* 10.7*  --  10.4*  --   MG  --   --   --   --  1.9 2.0  --  2.1  --   PHOS 2.1*  --   --   --   --   --   --   --   --    <  > = values in this interval not displayed.   Liver Function Tests: Recent Labs  Lab 10/08/20 0610 10/09/20 0159 10/10/20 0148  AST 18 16 16   ALT 29 27 22   ALKPHOS 83 81 74  BILITOT 0.6 0.6 0.2*  PROT 6.4* 6.2* 5.2*  ALBUMIN 3.0* 3.0* 2.3*   No results for input(s): LIPASE, AMYLASE in the last 168 hours. No results for input(s): AMMONIA in the last 168 hours. CBC: Recent Labs  Lab 10/08/20 0610 10/09/20 0159 10/10/20 0148 10/11/20 0103 10/12/20 10/11/20 10/13/20 0131 10/13/20 2331 10/14/20 0042 10/14/20 0213  WBC 19.0* 15.4* 12.1* 14.2* 11.7* 10.6*  --  8.5  --   NEUTROABS 13.7* 9.4* 7.3  --   --   --   --   --   --   HGB 12.0 12.0 11.0* 11.3* 10.2* 10.0* 11.2* 9.7* 9.9*  HCT 38.0 38.1 34.9* 36.2 32.6* 32.3* 33.0* 31.0* 29.0*  MCV 85.6 84.5 86.0 86.2 86.9 86.6  --  86.6  --   PLT 394 344 285 288 286 262  --  273  --    Cardiac Enzymes: No results for input(s): CKTOTAL, CKMB, CKMBINDEX, TROPONINI in the last 168 hours. BNP (last 3 results) Recent Labs    10/04/20 0051 10/05/20 0049 10/06/20 0548  BNP 145.8* 115.3* 125.4*    ProBNP (last 3 results) No results for input(s): PROBNP in the last 8760 hours.  CBG: Recent Labs  Lab 10/13/20 1907 10/13/20 2318 10/14/20 0347 10/14/20 0724 10/14/20 1146  GLUCAP 126* 208* 312* 275* 320*    Recent Results (from the past 240 hour(s))  MRSA PCR Screening     Status: Abnormal   Collection Time: 10/05/20  5:07 PM  Result Value Ref Range Status   MRSA by PCR POSITIVE (A) NEGATIVE Final    Comment:        The GeneXpert MRSA Assay (FDA approved for NASAL specimens only), is one component of a comprehensive MRSA colonization surveillance program. It is not intended to diagnose MRSA infection nor to guide or monitor treatment for MRSA infections. RESULT CALLED TO, READ BACK BY AND VERIFIED WITH06/27/22 RN 10/16/20 10/05/20 A BROWNING Performed at [facility], [address], [place], 10/07/20 [facility]   Culture,  blood (Routine X 2) w Reflex to ID Panel     Status: None   Collection Time: 10/07/20  9:37 AM   Specimen: BLOOD LEFT HAND  Result Value Ref Range Status   Specimen Description BLOOD LEFT HAND  Final   Special Requests   Final    BOTTLES DRAWN AEROBIC ONLY Blood Culture results may not be optimal due to an  inadequate volume of blood received in culture bottles   Culture   Final    NO GROWTH 5 DAYS Performed at [facility], [address] [place], [place] [address]    Report Status 10/12/2020 FINAL  Final  Culture, blood (Routine X 2) w Reflex to ID Panel     Status: None   Collection Time: 10/07/20  9:45 AM   Specimen: BLOOD RIGHT HAND  Result Value Ref Range Status   Specimen Description BLOOD RIGHT HAND  Final   Special Requests   Final    BOTTLES DRAWN AEROBIC ONLY Blood Culture adequate volume   Culture   Final    NO GROWTH 5 DAYS Performed at [facility], [address] [place], [place] [address]    Report Status 10/12/2020 FINAL  Final     Studies: CT HEAD WO CONTRAST  Result Date: 10/13/2020 CLINICAL DATA:  Initial evaluation for neuro deficit, stroke suspected. EXAM: CT HEAD WITHOUT CONTRAST TECHNIQUE: Contiguous axial images were obtained from the base of the skull through the vertex without intravenous contrast. COMPARISON:  Prior CT from 12/18/2019. FINDINGS: Brain: Examination degraded by motion and patient positioning. Diffuse prominence of the CSF containing spaces consistent with atrophy, progressed as compared to previous exams. Patchy hypodensity involving the periventricular and deep white matter both cerebral hemispheres most likely related to chronic microvascular ischemic disease, also progressed. No acute intracranial hemorrhage. No definite or visible acute large vessel territory infarct. Apparent vague hypodensity involving the left occipital region favored to be artifactual related to motion and/or positioning. No mass lesion or midline shift. No  hydrocephalus or visible extra-axial fluid collection. Vascular: No hyperdense vessel. Skull: Scalp soft tissues demonstrate no acute finding. Calvarium intact. Sinuses/Orbits: Globes and orbital soft tissues demonstrate no acute finding. Layering fluid noted within the left sphenoid sinus. Scattered mucosal thickening noted within the ethmoidal air cells and maxillary sinuses. Trace left mastoid effusion noted. Other: None. IMPRESSION: 1. Motion degraded exam. No definite acute intracranial abnormality identified. 2. Generalized age-related cerebral atrophy with chronic small vessel ischemic disease, progressed as compared to previous exams. Finding presumably related to history of prior cardiac arrest with subsequent hypoxic ischemic injury. 3. Acute left sphenoid sinusitis. Electronically Signed   By: Rise Mu M.D.   On: 10/13/2020 02:18   DG CHEST PORT 1 VIEW  Result Date: 10/14/2020 CLINICAL DATA:  Hypoxia. EXAM: PORTABLE CHEST 1 VIEW COMPARISON:  Radiograph yesterday. FINDINGS: Tracheostomy tube tip at the thoracic inlet. The heart is normal in size. Normal mediastinal contours. Minor atelectasis at the left lung base. Lungs are otherwise clear. Patient's chin partially obscures the apices. There is no pulmonary edema, pleural effusion, or pneumothorax. IMPRESSION: 1. Minor left basilar atelectasis. 2. Tracheostomy tube tip at the thoracic inlet. Electronically Signed   By: Narda Rutherford M.D.   On: 10/14/2020 00:32    Scheduled Meds:  albuterol  2.5 mg Nebulization Q6H   arformoterol  15 mcg Nebulization BID   baclofen  5 mg Per Tube TID   budesonide  0.5 mg Nebulization BID   chlorhexidine  15 mL Mouth/Throat BID   Chlorhexidine Gluconate Cloth  6 each Topical Daily   clonazePAM  0.5 mg Per Tube BID   clopidogrel  75 mg Per Tube Daily   enoxaparin (LOVENOX) injection  0.5 mg/kg Subcutaneous Q24H   famotidine  20 mg Per Tube BID   feeding supplement (PROSource TF)  45 mL Per  Tube BID   fiber  1 packet Per Tube BID   guaiFENesin  5 mL Per Tube Q6H   insulin aspart  0-15 Units Subcutaneous Q4H   insulin aspart  5 Units Subcutaneous Q4H   insulin glargine  40 Units Subcutaneous QHS   levETIRAcetam  2,500 mg Per Tube BID   loratadine  10 mg Per Tube Daily   mouth rinse  15 mL Mouth Rinse 10 times per day   metoprolol tartrate  25 mg Per Tube BID   montelukast  10 mg Per Tube Daily   polyethylene glycol  17 g Per Tube Daily   potassium chloride  20 mEq Per Tube Daily   revefenacin  175 mcg Nebulization Daily   senna-docusate  1 tablet Per Tube q AM   tiZANidine  2 mg Per Tube QHS   Continuous Infusions:  dexmedetomidine (PRECEDEX) IV infusion Stopped (10/14/20 0916)   feeding supplement (JEVITY 1.5 CAL/FIBER) 1,000 mL (10/13/20 1350)    Principal Problem:   Sepsis (HCC) Active Problems:   COPD (chronic obstructive pulmonary disease) (HCC)   Chronic diastolic CHF (congestive heart failure) (HCC)   Acute on chronic respiratory failure with hypoxia (HCC)   GERD (gastroesophageal reflux disease)   Seizures (HCC)   Type 2 diabetes mellitus without complication, with long-term current use of insulin (HCC)   Severe hypoxic-ischemic encephalopathy   Tracheostomy dependence (HCC)   Pressure injury of skin   Consultants: PCCM Palliative medicine Neurology  Procedures: Echocardiogram Replacement of trach  Antibiotics: Cefepime 6/12 through 6/14 Flagyl 6/12 through 6/14 Vancomycin 6/12 through 6/14 Cefepime x1 dose 6/18 Vancomycin x2 doses 6/18, 6/19   Darlin Priestly MD  [facility] 7 am - 330 pm/M-F for direct patient care and secure chat Please refer to Amion for contact info 13  days

## 2020-10-14 NOTE — Progress Notes (Signed)
eLink Physician-Brief Progress Note Patient Name: Sonia Drake DOB: [birth year] MRN: [id number]   Date of Service  10/14/2020  HPI/Events of Note  Neurologist starting propofol for seizures. Now on keppra, Depakote and received multiple rounds of ativan. Concern is for low BP for propofol use.  eICU Interventions  Ordered Levophed via PIV , low dose for now while going on propofol.  Discussed with bed side RN.      Intervention Category Intermediate Interventions: Hypotension - evaluation and management  Ranee Gosselin 10/14/2020, 8:36 PM

## 2020-10-14 NOTE — Progress Notes (Signed)
NEUROLOGY CONSULTATION PROGRESS NOTE   Date of service: October 14, 2020 Patient Name: Sonia Drake MRN:  [id number] DOB:  1955/10/13  Brief HPI  Sonia Drake is a 65 y.o. female with hx of PEA arrest s/p ROSC and anoxic brain injury in August 2021, chronic respiratory failure with permanent tracheostomy in place, type 2 diabetes mellitus, congestive heart failure, GERD, seizures on Keppra 2500mg  BID + Klonopin 0.5mg  BID, and severe hypoxic-ischemic encephalopathy who presented to the ED on 6/12 from her skilled nursing facility for evaluation of respiratory distress.  She had previously been ventilator dependent but had been weaned form the trach.  On 6/16 a bronchoscopy revealed dynamic collapse of her trachea, tracheostomy was replaced.  Later required mechanical ventilation.  She was seen by our team initially on 6/13 in regards to prognostication and noted to be in persistent vegetative state with preserved brainstem reflexes. Exam felt to be consistent with diffuse derebral hypofunction in the setting of anoxic brain injury.  Interval Hx   She was noted to have intermittent twitching and myoclonic jerks for which we were asked to evaluate her again.  Vitals   Vitals:   10/14/20 1700 10/14/20 1715 10/14/20 1730 10/14/20 1745  BP: 117/60 118/66 129/67 131/78  Pulse: 85 71 87 79  Resp: (!) 21 19 20 20   Temp:      TempSrc:      SpO2: 97% 100% 97% 100%  Weight:      Height:         Body mass index is 34.8 kg/m.  Physical Exam   General: Laying comfortably in bed; trach in place. HENT: Normal oropharynx and mucosa. Normal external appearance of ears and nose. Neck: Supple, no pain or tenderness CV: No JVD. No peripheral edema. Pulmonary: Symmetric Chest rise. Triggers vent Abdomen: Soft to touch, non-tender. Ext: No cyanosis, wasting noted in extremities Skin: No rash. Normal palpation of skin.  Musculoskeletal: Normal digits and nails by inspection.    Neurologic  Examination  Mental status/Cognition: no response to loud voice. Noted to have non-rhythmic blinking to loud clap. Turns her head left to right and grimaces to nares stimulation. Speech/language: None, does not attempt to communicate.  Brainstem reflexes/Cranial nerves: Pupils 75mm BL, minimal reactivity to light. Corneals: + BL Cough: intact Gag: did not assess.  Motor/sensory:  No spontaneous movement, no withdrawal to pain in any extremities.  Labs   Basic Metabolic Panel:  Lab Results  Component Value Date   NA 137 10/14/2020   K 4.3 10/14/2020   CO2 25 10/14/2020   GLUCOSE 217 (H) 10/14/2020   BUN 15 10/14/2020   CREATININE 0.48 10/14/2020   CALCIUM 10.4 (H) 10/14/2020   GFRNONAA >60 10/14/2020   GFRAA >60 08/30/2019   HbA1c:  Lab Results  Component Value Date   HGBA1C 7.0 (H) 10/16/2020   LDL: No results found for: [facility] Urine Drug Screen: No results found for: LABOPIA, COCAINSCRNUR, LABBENZ, AMPHETMU, THCU, LABBARB  Alcohol Level No results found for: [facility] Lab Results  Component Value Date   LEVETIRACETA 53.9 (H) 10/02/2020   No results found for: PHENYTOIN, PHENOBARB, VALPROATE, CBMZ  Imaging and Diagnostic studies   CT Head without contrast: 1. Motion degraded exam. No definite acute intracranial abnormality identified. 2. Generalized age-related cerebral atrophy with chronic small vessel ischemic disease, progressed as compared to previous exams. Finding presumably related to history of prior cardiac arrest with subsequent hypoxic ischemic injury. 3. Acute left sphenoid sinusitis.  Impression  Sonia Drake is a 65 y.o. female with PMH significant for PEA arrest s/p ROSC and anoxic brain injury in August 2021, chronic respiratory failure with permanent tracheostomy in place, type 2 diabetes mellitus, congestive heart failure, GERD, seizures on Keppra 2500mg  BID + Klonopin 0.5mg  BID, and severe hypoxic-ischemic encephalopathy who presented to the ED  on 6/12 from her skilled nursing facility for evaluation of respiratory distress.  She had previously been ventilator dependent but had been weaned form the trach.  On 6/16 a bronchoscopy revealed dynamic collapse of her trachea, tracheostomy was replaced.  Later required mechanical ventilation.  She was seen by our team initially on 6/13 in regards to prognostication and noted to be in persistent vegetative state with preserved brainstem reflexes. Exam felt to be consistent with diffuse derebral hypofunction in the setting of anoxic brain injury.  She was noted to have intermittent twitching and myoclonic jerks for which we were asked to evaluate her again.  Recommendations  - cEEG - continue Keppra 2500mg  BID - continue Klonopin 0.5mg  BID - Loaded with Depakote 1500mg  IV once and started on Depakote 500mg  BID - May need to do Propofol gtt if she continues to have myoclonic seizures ______________________________________________________________________  This patient is critically ill and at significant risk of neurological worsening, death and care requires constant monitoring of vital signs, hemodynamics,respiratory and cardiac monitoring, neurological assessment, discussion with family, other specialists and medical decision making of high complexity. I spent 40 minutes of neurocritical care time  in the care of  this patient. This was time spent independent of any time provided by nurse practitioner or PA.  7/13 [facility] Pager Number 10/14/2020  7:16 PM   Thank you for the opportunity to take part in the care of this patient. If you have any further questions, please contact the neurology consultation attending.  Signed,  [facility] Pager Number [phone]

## 2020-10-14 NOTE — Progress Notes (Signed)
vLTM EEG started. Notified Neuro 

## 2020-10-14 NOTE — Progress Notes (Signed)
eLink Physician-Brief Progress Note Patient Name: SHELSY SENG DOB: 1956-02-08 MRN: [id number]   Date of Service  10/14/2020  HPI/Events of Note  Camera:  Having facial and arm twitching. No clonic tonic of extrimities. Eyes - no nystagmus or beating upwards without tachycardia or hypertensive.  Gets Keppra 2.5 gm via tube q12 hr.  ABG from AM pco2 < 45. Sats good.   eICU Interventions  Ativan 2 mg q5 mins prn x 2 doses ordered.  Consider neurology help in AM. Has these on going twitches as per bed side RN in day also.      Intervention Category Major Interventions: Seizures - evaluation and management  Ranee Gosselin 10/14/2020, 5:39 AM

## 2020-10-14 NOTE — Progress Notes (Signed)
Myoclonic jerking 0525-544. 2mg  ativan given. Elink notified.

## 2020-10-14 NOTE — Plan of Care (Signed)
Alerted by bedside nursing and respiratory staff that patient was having worsening respiratory acidosis.  pH was 7.3 PCO2 63.6 PO2 was 282.  Previous ABG on 10/05/2020 shows pH 7.4 PCO2 47.6.  2 mg of magnesium been given for spasm with albuterol nebs 10 mg.  There was concern that patient was still having issues with the air entry.  Peak pressure was elevated around 45.  However plateau range from 25-29.  Patient appears comfortable on the vent there is bilateral wheezing however there is breath sounds bilaterally.  I agree that they are diminished.  Did increase respiratory rate to 20.  And monitored patient for any signs of air trapping.  Inspiratory and expiratory flow loops with return to baseline and no concern for air trapping based off of this.  Patient apparently had concerns for air trapping on graphics when she was more agitated.  This is improved immensely after administration of sedation with Ativan.  Agree with current management as has been done as patient looks stable at this point and mild respiratory acidosis is not very concerning.  However if patient should have any worsening respiratory failure or respiratory acidosis the following plan would be recommended:  -Continue respiratory rate 20 -Can consider augmentation of tidal volume with close monitoring for air trapping -Could consider increasing PEEP from 5-8.  I am aware that the peak inspiratory pressure will increase as well as the plateau.  However if patient truly does have bronchomalacia this may help stent airways open. -Ensure patient adequately sedated.  Patient on Precedex can optimize with increased dosage and use as needed fentanyl.  Vertis Kelch DO Internal Medicine/Pediatrics Pulmonary and Critical Care

## 2020-10-15 LAB — GLUCOSE, CAPILLARY
Glucose-Capillary: 164 mg/dL — ABNORMAL HIGH (ref 70–99)
Glucose-Capillary: 192 mg/dL — ABNORMAL HIGH (ref 70–99)
Glucose-Capillary: 188 mg/dL — ABNORMAL HIGH (ref 70–99)
Glucose-Capillary: 161 mg/dL — ABNORMAL HIGH (ref 70–99)
Glucose-Capillary: 149 mg/dL — ABNORMAL HIGH (ref 70–99)
Glucose-Capillary: 163 mg/dL — ABNORMAL HIGH (ref 70–99)

## 2020-10-15 LAB — CBC
HCT: 33.7 % — ABNORMAL LOW (ref 36.0–46.0)
Hemoglobin: 10.4 g/dL — ABNORMAL LOW (ref 12.0–15.0)
MCH: 27.2 pg (ref 26.0–34.0)
MCHC: 30.9 g/dL (ref 30.0–36.0)
MCV: 88.2 fL (ref 80.0–100.0)
Platelets: 243 10*3/uL (ref 150–400)
RBC: 3.82 MIL/uL — ABNORMAL LOW (ref 3.87–5.11)
RDW: 16.8 % — ABNORMAL HIGH (ref 11.5–15.5)
WBC: 14.6 10*3/uL — ABNORMAL HIGH (ref 4.0–10.5)
nRBC: 0 % (ref 0.0–0.2)

## 2020-10-15 LAB — BASIC METABOLIC PANEL
Anion gap: 9 (ref 5–15)
BUN: 16 mg/dL (ref 8–23)
CO2: 25 mmol/L (ref 22–32)
Calcium: 10.5 mg/dL — ABNORMAL HIGH (ref 8.9–10.3)
Chloride: 106 mmol/L (ref 98–111)
Creatinine, Ser: 0.45 mg/dL (ref 0.44–1.00)
GFR, Estimated: >60 mL/min (ref >60)
Glucose, Bld: 195 mg/dL — ABNORMAL HIGH (ref 70–99)
Potassium: 4.6 mmol/L (ref 3.5–5.1)
Sodium: 140 mmol/L (ref 135–145)

## 2020-10-15 LAB — TRIGLYCERIDES: Triglycerides: 221 mg/dL — ABNORMAL HIGH (ref <150)

## 2020-10-15 LAB — MAGNESIUM: Magnesium: 2 mg/dL (ref 1.7–2.4)

## 2020-10-15 MED ORDER — CHLORHEXIDINE GLUCONATE 0.12% ORAL RINSE (MEDLINE KIT)
15.0000 mL | Freq: Two times a day (BID) | OROMUCOSAL | Status: DC
  Administered 2020-10-15 – 2021-01-27 (×207): 15 mL via OROMUCOSAL

## 2020-10-15 MED ORDER — TRANEXAMIC ACID FOR INHALATION
500.0000 mg | Freq: Three times a day (TID) | RESPIRATORY_TRACT | Status: DC
  Administered 2020-10-15 – 2020-10-17 (×5): 500 mg via RESPIRATORY_TRACT
  Filled 2020-10-15 (×5): qty 10

## 2020-10-15 MED ORDER — ORAL CARE MOUTH RINSE
15.0000 mL | OROMUCOSAL | Status: DC
  Administered 2020-10-15 – 2020-12-11 (×568): 15 mL via OROMUCOSAL

## 2020-10-15 NOTE — Procedures (Addendum)
Patient Name: Sonia Drake  MRN: [id number]  Epilepsy Attending: Charlsie Quest  Referring Physician/Provider: Dr Levon Hedger Duration: 10/14/2020 1157 to 10/15/2020 1157  Patient history: 64yo F with h/o anoxic brain injury now with seizure like episodes. EEG to evaluate for seizure  Level of alertness: comatose  AEDs during EEG study: Clonazepam, LEV, VPA, propofol  Technical aspects: This EEG study was done with scalp electrodes positioned according to the 10-20 International system of electrode placement. Electrical activity was acquired at a sampling rate of 500Hz  and reviewed with a high frequency filter of 70Hz  and a low frequency filter of 1Hz . EEG data were recorded continuously and digitally stored.   Description: EEG initially showed continuous generalized mixed frequencies with predominantly 3 to 6 Hz theta-delta slowing admixed with 15-18hz  frontocentral beta activity. After around 1649 on 10/14/2020, patient started having brief bilateral shoulder jerking every 2-5 seconds. Concomitant EEG showed generalized polyspikes consistent with myoclonic seizures. The seizures would improve briefly for about 20-30 minutes and resume again. After 2200 on 10/14/2020, eeg showed generalized low amplitude 3-6H theta-delta slowing admixed with 15-18Hz  frontocentral beta activity as well as brief periods of generalized eeg attenuation.  Between 4am to 445 am on 10/15/2020, eeg again showed brief myoclonic seizures every few seconds.  After around 1015, EEG showed generalized periodic epileptiform discharges at 0.5 to 1 Hz.  ABNORMALITY - Myoclonic seizures, generalized - Periodic discharges, generalized - Continuous slow, generalized  IMPRESSION: This study showed myoclonic seizures every few seconds which improved after around 1649 on 10/14/2020 as AEDs were adjusted. Additionally, there was evidence of epileptogenicity with generalized onset as well as severe diffuse encephalopathy, most likely  due to patient's history of anoxic brain injury.  Dr 10/16/2020 and Dr 10/17/2020 were intermittently notified.    Wlliam Grosso 10/16/2020

## 2020-10-15 NOTE — Progress Notes (Signed)
EEG maintenance complete. No skin breakdown at electrode site FP2 c4 cz p7. Continue to monitor

## 2020-10-15 NOTE — Progress Notes (Signed)
NAME:  KATHRYNE RAMELLA, MRN:  [id number], DOB:  1955-11-09, LOS: 14 ADMISSION DATE:  10-15-2020, CONSULTATION DATE:  6/15 REFERRING MD:  Thedore Mins, CHIEF COMPLAINT:  Dyspnea   History of Present Illness:  65 y/o female with anoxic brain injury, tracheostomy status who was admitted from a SNF on 6/12 with worsening respiratory failure.  She had previously been ventilator dependent but had been weaned form the trach.  On 6/16 a bronchoscopy revealed dynamic collapse of her trachea, tracheostomy was replaced.  Later required mechanical ventilation.   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 No distress. WBC ct trending down. No events overnight growing GPC clusters in two of two cultures  6/16 increased work of breathing, upper airway noise, abdominal muscle use.  Bronchoscopy performed as trach exchange cuffless #8 Shiley XLT showed dynamic airway collapse, tracheal collapse. 6/16 continued respiratory distress moved to ICU, changed to cuffed #6 Shiley XLT, placed on MV with some improved comfort 6/12 cefepime > 6/14, 6/18 6/12 vanc > 6/14, 6/18  6/23 mucus plugging, severe vent dyssynchrony 6/24-25 intermittent jerking, seizures on EEG  Interim History / Subjective:  Now on propofol for seizure suppression. Neurology now back on board.  Objective   Blood pressure (!) 113/55, pulse (!) 52, temperature 97.7 F (36.5 C), temperature source Oral, resp. rate 20, height 5\' 2"  (1.575 m), weight 92.2 kg, SpO2 100 %.    Vent Mode: PRVC FiO2  (%):  [28 %] 28 % Set Rate:  [20 bmp] 20 bmp Vt Set:  [400 mL] 400 mL PEEP:  [5 cmH20] 5 cmH20 Plateau Pressure:  [18 cmH20-21 cmH20] 18 cmH20   Intake/Output Summary (Last 24 hours) at 10/15/2020 0701 Last data filed at 10/14/2020 2200 Gross per 24 hour  Intake 708.77 ml  Output 650 ml  Net 58.77 ml    Filed Weights   10/13/20 0500 10/14/20 0434 10/15/20 0500  Weight: 85.8 kg 86.3 kg 92.2 kg    Examination: Constitutional: unresponsive woman on vent  Eyes: pupils pinpoint, not tracking Ears, nose, mouth, and throat: trach in place, minimal secretions Cardiovascular: RRR, ext warm Respiratory: diminished bilaterally Gastrointestinal: soft, +BS Skin: No rashes, normal turgor Neurologic: flexor posturing to painful stimuli otherwise no response Psychiatric: cannot assess   Labs/imaging that I havepersonally reviewed  (right click and "Reselect all SmartList Selections" daily)  BMP pending WBC up  Resolved Hospital Problem list     Assessment & Plan:  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, severe with agitation-dependent air trapping Ventilator dependent due to all the above Staph bacteremia 2/2 cultures (contaminant per ID), monitoring surveillance cultures 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 now on AEDs and propofol DM2 with hyperglycemia> currently well controlled Hypertension with chronic diastolic CHF Chronic rhinitis  - AEDs and seizure suppression per neurology - Pressors titrated to MAP 65, may need to get a PICC but will see how needs are through day - Continue vent support, VAP prevention bundle, PS as able but likely will be limited by attempts at seizure suppression - Adjustments to trach position  to limit air trapping - Grim prognosis relayed to family multiple times, goal is quantity over quality of life; work toward  vent SNF  Patient critically ill due to respiratory failure, shock Interventions to address this today pressor and vent titration Risk of deterioration without these interventions is high  I personally spent 34 minutes providing critical care not including any separately billable procedures  Myrla Halsted MD [facility]  Prefer epic messenger for cross cover needs If after hours, please call E-link

## 2020-10-15 NOTE — Progress Notes (Signed)
Brief Neuro Update:  cEEG stable. Will continue propofol 46mcg/Kg./min for now and start weaning down if no seizures on cEEG for 24 hours.  Erick Blinks [facility] Pager Number [phone]

## 2020-10-15 NOTE — Progress Notes (Signed)
TXA for some hemoptysis

## 2020-10-16 LAB — BASIC METABOLIC PANEL
Anion gap: 11 (ref 5–15)
BUN: 13 mg/dL (ref 8–23)
CO2: 20 mmol/L — ABNORMAL LOW (ref 22–32)
Calcium: 10 mg/dL (ref 8.9–10.3)
Chloride: 107 mmol/L (ref 98–111)
Creatinine, Ser: 0.41 mg/dL — ABNORMAL LOW (ref 0.44–1.00)
GFR, Estimated: >60 mL/min (ref >60)
Glucose, Bld: 179 mg/dL — ABNORMAL HIGH (ref 70–99)
Potassium: 4.1 mmol/L (ref 3.5–5.1)
Sodium: 138 mmol/L (ref 135–145)

## 2020-10-16 LAB — CBC
HCT: 30.9 % — ABNORMAL LOW (ref 36.0–46.0)
Hemoglobin: 9.6 g/dL — ABNORMAL LOW (ref 12.0–15.0)
MCH: 26.9 pg (ref 26.0–34.0)
MCHC: 31.1 g/dL (ref 30.0–36.0)
MCV: 86.6 fL (ref 80.0–100.0)
Platelets: 259 10*3/uL (ref 150–400)
RBC: 3.57 MIL/uL — ABNORMAL LOW (ref 3.87–5.11)
RDW: 16.7 % — ABNORMAL HIGH (ref 11.5–15.5)
WBC: 10.9 10*3/uL — ABNORMAL HIGH (ref 4.0–10.5)
nRBC: 0 % (ref 0.0–0.2)

## 2020-10-16 LAB — GLUCOSE, CAPILLARY
Glucose-Capillary: 171 mg/dL — ABNORMAL HIGH (ref 70–99)
Glucose-Capillary: 161 mg/dL — ABNORMAL HIGH (ref 70–99)
Glucose-Capillary: 157 mg/dL — ABNORMAL HIGH (ref 70–99)
Glucose-Capillary: 125 mg/dL — ABNORMAL HIGH (ref 70–99)
Glucose-Capillary: 107 mg/dL — ABNORMAL HIGH (ref 70–99)
Glucose-Capillary: 150 mg/dL — ABNORMAL HIGH (ref 70–99)

## 2020-10-16 LAB — MAGNESIUM: Magnesium: 1.9 mg/dL (ref 1.7–2.4)

## 2020-10-16 LAB — VALPROIC ACID LEVEL: Valproic Acid Lvl: 39 ug/mL — ABNORMAL LOW (ref 50.0–100.0)

## 2020-10-16 MED ORDER — CLONAZEPAM 1 MG PO TABS
1.0000 mg | ORAL_TABLET | Freq: Two times a day (BID) | ORAL | Status: DC
  Administered 2020-10-16 – 2020-10-23 (×14): 1 mg
  Filled 2020-10-16 (×14): qty 1

## 2020-10-16 NOTE — Plan of Care (Signed)
  Problem: Clinical Measurements: Goal: Ability to maintain clinical measurements within normal limits will improve Outcome: Progressing Goal: Will remain free from infection Outcome: Progressing Goal: Diagnostic test results will improve Outcome: Progressing Goal: Cardiovascular complication will be avoided Outcome: Progressing   Problem: Nutrition: Goal: Adequate nutrition will be maintained Outcome: Progressing   Problem: Elimination: Goal: Will not experience complications related to bowel motility Outcome: Progressing Goal: Will not experience complications related to urinary retention Outcome: Progressing   Problem: Pain Managment: Goal: General experience of comfort will improve Outcome: Progressing   Problem: Safety: Goal: Ability to remain free from injury will improve Outcome: Progressing   Problem: Skin Integrity: Goal: Risk for impaired skin integrity will decrease Outcome: Progressing   Problem: Activity: Goal: Ability to tolerate increased activity will improve Outcome: Progressing   Problem: Respiratory: Goal: Ability to maintain a clear airway and adequate ventilation will improve Outcome: Progressing   Problem: Education: Goal: Knowledge about tracheostomy care/management will improve Outcome: Progressing   Problem: Activity: Goal: Ability to tolerate increased activity will improve Outcome: Progressing   Problem: Health Behavior/Discharge Planning: Goal: Ability to manage tracheostomy will improve Outcome: Progressing   Problem: Respiratory: Goal: Patent airway maintenance will improve Outcome: Progressing

## 2020-10-16 NOTE — Progress Notes (Signed)
EEG maintenance complete. No skin breakdown. At CZ P7 FP1 FP2

## 2020-10-16 NOTE — Progress Notes (Signed)
Subjective: No acute events overnight.  No family at bedside.  ROS: Unable to obtain due to poor mental status  Examination  Vital signs in last 24 hours: Temp:  [97.4 F (36.3 C)-98.6 F (37 C)] 98.3 F (36.8 C) (06/27 1128) Pulse Rate:  [58-113] 113 (06/27 1200) Resp:  [17-34] 24 (06/27 1200) BP: (76-153)/(42-108) 121/82 (06/27 1200) SpO2:  [89 %-100 %] 98 % (06/27 1200) FiO2 (%):  [28 %] 28 % (06/27 1200) Weight:  [93.7 kg] 93.7 kg (06/27 0417)  General: lying in bed, not in apparent distress CVS: pulse-normal rate and rhythm RS: Tracheostomy in place, coarse breath sounds bilaterally Extremities: warm Neuro: On propofol @20mcg /hr, opens eyes to repeated noxious stimuli, does not follow commands, PERRLA, fundi reflex intact, Intact, withdraws to noxious stimuli in all 4 extremities, spastic in all extremities  Basic Metabolic Panel: Recent Labs  Lab 10/12/20 0204 10/13/20 0131 10/13/20 2331 10/14/20 0042 10/14/20 0213 10/15/20 0632 10/16/20 0311  NA 140 139 140 136 137 140 138  K 4.0 4.4 3.9 4.0 4.3 4.6 4.1  CL 103 103  --  101  --  106 107  CO2 31 28  --  25  --  25 20*  GLUCOSE 192* 108*  --  217*  --  195* 179*  BUN 13 13  --  15  --  16 13  CREATININE 0.42* 0.39*  --  0.48  --  0.45 0.41*  CALCIUM 10.4* 10.7*  --  10.4*  --  10.5* 10.0  MG 1.9 2.0  --  2.1  --  2.0 1.9    CBC: Recent Labs  Lab 10/10/20 0148 10/11/20 0103 10/12/20 0204 10/13/20 0131 10/13/20 2331 10/14/20 0042 10/14/20 0213 10/15/20 0632 10/16/20 0930  WBC 12.1*   < > 11.7* 10.6*  --  8.5  --  14.6* 10.9*  NEUTROABS 7.3  --   --   --   --   --   --   --   --   HGB 11.0*   < > 10.2* 10.0* 11.2* 9.7* 9.9* 10.4* 9.6*  HCT 34.9*   < > 32.6* 32.3* 33.0* 31.0* 29.0* 33.7* 30.9*  MCV 86.0   < > 86.9 86.6  --  86.6  --  88.2 86.6  PLT 285   < > 286 262  --  273  --  243 259   < > = values in this interval not displayed.     Coagulation Studies: No results for input(s): LABPROT, INR in  the last 72 hours.  Imaging CT head without contrast 10/13/2020:  No definite acute intracranial abnormality identified. 2. Generalized age-related cerebral atrophy with chronic small vessel ischemic disease, progressed as compared to previous exams. Finding presumably related to history of prior cardiac arrest with subsequent hypoxic ischemic injury. 3. Acute left sphenoid sinusitis.  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  Myoclonic seizures Anoxic encephalopathy (present on admission) -LTM EEG showed stimulation induced rhythmic periodic discharges  Recommendations -Recommend weaning propofol at 5 mcg/hr to stop.  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 which have likely recurred due to worsening respiratory function -Continue Keppra 2500 mg twice daily -Continue Depakote 500 mg twice daily, will check Depakote level and increase if needed -Increase Klonopin to 1 mg twice daily -  If seizures recur, will adjust AEDs  -If patient has continuous myoclonic seizures, can resume propofol -If seizures improved, will discontinue LTM EEG tomorrow after weaning propofol -We will consider MRI brain without contrast if needed -Continue seizure precautions -Of note, per review of chart, her family understands patient's severe neurologic injury and would like to continue current scope of care -Management of rest of comorbidities per primary team  CRITICAL CARE Performed by: Charlsie Quest   Total critical care time: 35 minutes  Critical care time was exclusive of separately billable procedures and treating other patients.  Critical care was necessary to treat or prevent imminent or life-threatening deterioration.  Critical care was time spent personally by me  on the following activities: development of treatment plan with patient and/or surrogate as well as nursing, discussions with consultants, evaluation of patient's response to treatment, examination of patient, obtaining history from patient or surrogate, ordering and performing treatments and interventions, ordering and review of laboratory studies, ordering and review of radiographic studies, pulse oximetry and re-evaluation of patient's condition.   Lindie Spruce Epilepsy [facility] For questions after 5pm please refer to AMION to reach the Neurologist on call

## 2020-10-16 NOTE — Procedures (Addendum)
Patient Name: Sonia Drake  MRN: [id number]  Epilepsy Attending: Charlsie Quest  Referring Physician/Provider: Dr Levon Hedger Duration: 10/15/2020 1157 to 10/16/2020 1157   Patient history: 64yo F with h/o anoxic brain injury now with seizure like episodes. EEG to evaluate for seizure   Level of alertness: lethargic   AEDs during EEG study: Clonazepam, LEV, VPA, propofol   Technical aspects: This EEG study was done with scalp electrodes positioned according to the 10-20 International system of electrode placement. Electrical activity was acquired at a sampling rate of 500Hz  and reviewed with a high frequency filter of 70Hz  and a low frequency filter of 1Hz . EEG data were recorded continuously and digitally stored.   Description: EEG showed continuous generalized mixed frequencies with predominantly 3 to 6 Hz theta-delta slowing admixed with 15-18hz  frontocentral beta activity.  Intermittent generalized sharply contoured high amplitude periodic epileptiform discharges were noted which were stimulation induced, and fluctuating between 0.5 to 2 Hz with superimposed rhythmic activity at times but without definite evolution.   ABNORMALITY - Stimulation induced rhythmic periodic discharges, generalized ( SIRPIDS) - Continuous slow, generalized   IMPRESSION: This study showed evidence of epileptogenicity with generalized onset which was stimulation induced. This EEG pattern is on the ictal-interictal continuum with intermediate to high potential for seizures.  Additionally there was severe diffuse encephalopathy, most likely due to patient's history of anoxic brain injury.  Gjon Letarte 

## 2020-10-16 NOTE — Progress Notes (Addendum)
NAME:  Sonia Drake, MRN:  [id number], DOB:  April 16, 1956, LOS: 15 ADMISSION DATE:  2020-10-12, CONSULTATION DATE:  6/15 REFERRING MD:  Thedore Mins, CHIEF COMPLAINT:  Dyspnea   History of Present Illness:  65 y/o female with anoxic brain injury, tracheostomy status who was admitted from a SNF on 6/12 with worsening respiratory failure.  She had previously been ventilator dependent but had been weaned form the trach.  On 6/16 a bronchoscopy revealed dynamic collapse of her trachea, tracheostomy was replaced.  Later required mechanical ventilation.   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 No distress. WBC ct trending down. No events overnight growing GPC clusters in two of two cultures  6/16 increased work of breathing, upper airway noise, abdominal muscle use.  Bronchoscopy performed as trach exchange cuffless #8 Shiley XLT showed dynamic airway collapse, tracheal collapse. 6/16 continued respiratory distress moved to ICU, changed to cuffed #6 Shiley XLT, placed on MV with some improved comfort 6/12 cefepime > 6/14, 6/18 6/12 vanc > 6/14, 6/18  6/23 mucus plugging, severe vent dyssynchrony 6/24-25 intermittent jerking, seizures on EEG  Interim History / Subjective:  No further seizure-like activity on current propofol gtt.  Had some hemoptysis yesterday for which started on TXA.   Objective   Blood pressure (!) 123/52, pulse 84, temperature 98.4 F (36.9 C), temperature source Oral, resp. rate 19, height 5\' 2"  (1.575 m), weight 93.7 kg,  SpO2 99 %.    Vent Mode: PRVC FiO2 (%):  [28 %] 28 % Set Rate:  [20 bmp] 20 bmp Vt Set:  [400 mL] 400 mL PEEP:  [5 cmH20] 5 cmH20 Plateau Pressure:  [18 cmH20-19 cmH20] 18 cmH20   Intake/Output Summary (Last 24 hours) at 10/16/2020 0708 Last data filed at 10/16/2020 0500 Gross per 24 hour  Intake 1705.74 ml  Output 750 ml  Net 955.74 ml   Filed Weights   10/14/20 0434 10/15/20 0500 10/16/20 0417  Weight: 86.3 kg 92.2 kg 93.7 kg    Examination: Constitutional: chronically ill appearing female, on mechanical ventilation  Eyes: pupils pinpoint but reactive to light, not tracking Ears, nose, mouth, and throat: trach in place, minimal secretions Cardiovascular: RRR, ext warm Respiratory: diminished bilaterally Gastrointestinal: soft, +BS Skin: No rashes, normal turgor Neurologic: flexor posturing to painful stimuli otherwise no response Psychiatric: cannot assess  Labs/imaging that I havepersonally reviewed  (right click and "Reselect all SmartList Selections" daily)  VQ scan 6/15> Neg for acute PE CXR 6/16 > New L midlung atelectasis CXR 6/18>  Bibasilar atelectasis  CT Head wo Contrast 6/24 > No definite acute intracranial abnormality, progressive cerebral atrophy w/chronic small vessel ischemic disease, acute L sphenoid sinusitis  Resolved Hospital Problem list    Assessment & Plan:  Acute on chronic respiratory failure with hypoxemia requiring ventilator support  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 Continued on full mechanical ventilation at this time.  - Continue vent support, VAP prevention bundle, PS as able but likely will be limited by attempts at seizure suppression - Adjustments to trach position to limit  air trapping - Continue brovana, pulmicort, Yupelri, and albuterol nebs   Chronic anoxic encephalopathy post cardiac  arrest ?Anoxic status epilepticus now on AEDs and propofol Patient continued on propofol and keppra.  - Continue LTM monitoring  - AEDs and seizure suppression per neurology  Circulatory shock  Likely 2/2 propofol gtt.  - Continue pressor support for MAP>65  Hemoptysis Started on TXA yesterday for episode of hemoptysis. AM CBC pending.  - Trend CBC - Continue TXA  - Will consider adjusting VTE ppx pending Hb   DM2 with hyperglycemia> currently well controlled Hypertension with chronic diastolic CHF  GOC:  - Grim prognosis relayed to family multiple times, goal is quantity over quality of life; work toward vent SNF  Critical care time: 35 minutes  Eliezer Bottom, MD Internal Medicine, PGY-2 10/16/20 7:53 AM Pager # [phone]

## 2020-10-16 NOTE — Progress Notes (Signed)
LTM maint complete - no skin breakdown , Hair net adjusted

## 2020-10-17 ENCOUNTER — Inpatient Hospital Stay (HOSPITAL_COMMUNITY): Payer: Medicare Other

## 2020-10-17 LAB — CBC
HCT: 31.3 % — ABNORMAL LOW (ref 36.0–46.0)
Hemoglobin: 9.7 g/dL — ABNORMAL LOW (ref 12.0–15.0)
MCH: 27 pg (ref 26.0–34.0)
MCHC: 31 g/dL (ref 30.0–36.0)
MCV: 87.2 fL (ref 80.0–100.0)
Platelets: 227 10*3/uL (ref 150–400)
RBC: 3.59 MIL/uL — ABNORMAL LOW (ref 3.87–5.11)
RDW: 17.2 % — ABNORMAL HIGH (ref 11.5–15.5)
WBC: 11 10*3/uL — ABNORMAL HIGH (ref 4.0–10.5)
nRBC: 0 % (ref 0.0–0.2)

## 2020-10-17 LAB — BASIC METABOLIC PANEL
Anion gap: 13 (ref 5–15)
BUN: 11 mg/dL (ref 8–23)
CO2: 23 mmol/L (ref 22–32)
Calcium: 9.9 mg/dL (ref 8.9–10.3)
Chloride: 101 mmol/L (ref 98–111)
Creatinine, Ser: 0.32 mg/dL — ABNORMAL LOW (ref 0.44–1.00)
GFR, Estimated: >60 mL/min (ref >60)
Glucose, Bld: 136 mg/dL — ABNORMAL HIGH (ref 70–99)
Potassium: 4.8 mmol/L (ref 3.5–5.1)
Sodium: 137 mmol/L (ref 135–145)

## 2020-10-17 LAB — GLUCOSE, CAPILLARY
Glucose-Capillary: 136 mg/dL — ABNORMAL HIGH (ref 70–99)
Glucose-Capillary: 107 mg/dL — ABNORMAL HIGH (ref 70–99)
Glucose-Capillary: 115 mg/dL — ABNORMAL HIGH (ref 70–99)
Glucose-Capillary: 136 mg/dL — ABNORMAL HIGH (ref 70–99)
Glucose-Capillary: 105 mg/dL — ABNORMAL HIGH (ref 70–99)
Glucose-Capillary: 148 mg/dL — ABNORMAL HIGH (ref 70–99)

## 2020-10-17 MED ORDER — FENTANYL CITRATE (PF) 100 MCG/2ML IJ SOLN: 25.0000 ug | INTRAMUSCULAR | Status: DC | PRN

## 2020-10-17 MED ORDER — VALPROATE SODIUM 100 MG/ML IV SOLN
750.0000 mg | Freq: Two times a day (BID) | INTRAVENOUS | Status: DC
  Administered 2020-10-17 – 2020-10-24 (×14): 750 mg via INTRAVENOUS
  Filled 2020-10-17 (×18): qty 7.5

## 2020-10-17 MED ORDER — FENTANYL CITRATE (PF) 100 MCG/2ML IJ SOLN
25.0000 ug | INTRAMUSCULAR | Status: DC | PRN
  Administered 2020-10-17 – 2020-10-22 (×3): 50 ug via INTRAVENOUS
  Administered 2020-10-22 – 2020-10-23 (×2): 100 ug via INTRAVENOUS
  Administered 2020-10-23 – 2020-10-28 (×13): 50 ug via INTRAVENOUS
  Administered 2020-10-30: 100 ug via INTRAVENOUS
  Administered 2020-10-30: 50 ug via INTRAVENOUS
  Administered 2020-10-30: 100 ug via INTRAVENOUS
  Administered 2020-10-30: 50 ug via INTRAVENOUS
  Administered 2020-11-02: 100 ug via INTRAVENOUS
  Administered 2020-11-02 (×2): 50 ug via INTRAVENOUS
  Administered 2020-11-04: 100 ug via INTRAVENOUS
  Administered 2020-11-04 – 2020-11-06 (×3): 50 ug via INTRAVENOUS
  Administered 2020-11-07 – 2020-11-17 (×8): 100 ug via INTRAVENOUS
  Administered 2020-11-18: 25 ug via INTRAVENOUS
  Administered 2020-11-19 (×2): 50 ug via INTRAVENOUS
  Administered 2020-11-20 (×2): 100 ug via INTRAVENOUS
  Administered 2020-11-22: 50 ug via INTRAVENOUS
  Filled 2020-10-17 (×48): qty 2

## 2020-10-17 NOTE — Procedures (Addendum)
Patient Name: Sonia Drake  MRN: [id number]  Epilepsy Attending: Charlsie Quest  Referring Physician/Provider: Dr Levon Hedger Duration: 10/16/2020 1157 to 10/17/2020 4287   Patient history: 65yo F with h/o anoxic brain injury now with seizure like episodes. EEG to evaluate for seizure   Level of alertness: lethargic   AEDs during EEG study: Clonazepam, LEV, VPA   Technical aspects: This EEG study was done with scalp electrodes positioned according to the 10-20 International system of electrode placement. Electrical activity was acquired at a sampling rate of 500Hz  and reviewed with a high frequency filter of 70Hz  and a low frequency filter of 1Hz . EEG data were recorded continuously and digitally stored.   Description: EEG showed continuous generalized mixed frequencies with predominantly 3 to 6 Hz theta-delta slowing admixed with 15-18hz  frontocentral beta activity.  Intermittent generalized sharply contoured high amplitude periodic epileptiform discharges were noted which were stimulation induced, and fluctuating between 0.5 to 2 Hz with superimposed rhythmic activity at times but without definite evolution.   ABNORMALITY - Stimulation induced rhythmic periodic discharges, generalized ( SIRPIDS) - Continuous slow, generalized   IMPRESSION: This study showed evidence of epileptogenicity with generalized onset which was stimulation induced. This EEG pattern is on the ictal-interictal continuum with intermediate to high potential for seizures.  Additionally there was severe diffuse encephalopathy, most likely due to patient's history of anoxic brain injury.  EEG appears similar to yesterday.   Sonia Drake 

## 2020-10-17 NOTE — Progress Notes (Signed)
Subjective: No acute events overnight  ROS: Unable to obtain due to poor mental status  Examination  Vital signs in last 24 hours: Temp:  [97.6 F (36.4 C)-99.1 F (37.3 C)] 97.8 F (36.6 C) (06/28 0743) Pulse Rate:  [76-113] 90 (06/28 1116) Resp:  [18-27] 26 (06/28 1116) BP: (86-143)/(53-90) 95/65 (06/28 0918) SpO2:  [97 %-100 %] 99 % (06/28 1116) FiO2 (%):  [28 %] 28 % (06/28 1116) Weight:  [92.2 kg] 92.2 kg (06/28 0500)  General: lying in bed, not in apparent distress CVS: pulse-normal rate and rhythm RS: Tracheostomy in place, coarse breath sounds bilaterally Extremities: warm Neuro: opens eyes to repeated noxious stimuli, does not follow commands, PERRLA, left gaze preference, corneal reflex intact, cough reflex intact, withdraws to noxious stimuli in all 4 extremities, spastic in all extremities    Basic Metabolic Panel: Recent Labs  Lab 10/12/20 0204 10/13/20 0131 10/13/20 2331 10/14/20 0042 10/14/20 0213 10/15/20 0632 10/16/20 0311  NA 140 139 140 136 137 140 138  K 4.0 4.4 3.9 4.0 4.3 4.6 4.1  CL 103 103  --  101  --  106 107  CO2 31 28  --  25  --  25 20*  GLUCOSE 192* 108*  --  217*  --  195* 179*  BUN 13 13  --  15  --  16 13  CREATININE 0.42* 0.39*  --  0.48  --  0.45 0.41*  CALCIUM 10.4* 10.7*  --  10.4*  --  10.5* 10.0  MG 1.9 2.0  --  2.1  --  2.0 1.9    CBC: Recent Labs  Lab 10/12/20 0204 10/13/20 0131 10/13/20 2331 10/14/20 0042 10/14/20 0213 10/15/20 0632 10/16/20 0930  WBC 11.7* 10.6*  --  8.5  --  14.6* 10.9*  HGB 10.2* 10.0* 11.2* 9.7* 9.9* 10.4* 9.6*  HCT 32.6* 32.3* 33.0* 31.0* 29.0* 33.7* 30.9*  MCV 86.9 86.6  --  86.6  --  88.2 86.6  PLT 286 262  --  273  --  243 259     Coagulation Studies: No results for input(s): LABPROT, INR in the last 72 hours.  Imaging MRI brain ordered and pending.  ASSESSMENT AND PLAN: 65 year old female with prior history of PEA status post ROSC, anoxic brain injury, in persistent vegetative  state (opens eyes but does not follow commands at baseline) who is noted to have intermittent twitching and myoclonic jerks.   Myoclonic seizures Anoxic encephalopathy (present on admission) -LTM EEG showed stimulation induced rhythmic periodic discharges   Recommendations 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 which have likely recurred due to worsening respiratory function -Continue Keppra 2500 mg twice daily, continue Klonopin to 1 mg twice daily -Increase Depakote to 750 mg twice daily  -If seizures recur, can increase klonopin or check depakote levels and increase accordingly -If patient has continuous myoclonic seizures, can resume propofol -Discontinue LTM EEG as eeg stable after weaning propofol -MRI brain without contrast to look for any worsening of anoxic injury -Continue seizure precautions -Of note, per review of chart, her family understands patient's severe neurologic injury and would like to continue current scope of care -Management of rest of comorbidities per primary team  Thank you for allowing [name] to participate in the care of this patient.  Neurology will follow peripherally.  Please call us for any further questions.  I have spent a total of 35 minutes with the patient reviewing hospital notes,  test results, labs and examining  the patient as well as establishing an assessment and plan.  > 50% of time was spent in direct patient care.  Lindie Spruce Epilepsy [facility] For questions after 5pm please refer to AMION to reach the Neurologist on call

## 2020-10-17 NOTE — Progress Notes (Signed)
NAME:  Sonia Drake, MRN:  [id number], DOB:  [birth year], LOS: 16 ADMISSION DATE:  October 21, 2020, CONSULTATION DATE:  6/15 REFERRING MD:  Thedore Mins, CHIEF COMPLAINT:  Dyspnea   History of Present Illness:  65 y/o female with anoxic brain injury, tracheostomy status who was admitted from a SNF on 6/12 with worsening respiratory failure.  She had previously been ventilator dependent but had been weaned form the trach.  On 6/16 a bronchoscopy revealed dynamic collapse of her trachea, tracheostomy was replaced.  Later required mechanical ventilation.   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 No distress. WBC ct trending down. No events overnight growing GPC clusters in two of two cultures  6/16 increased work of breathing, upper airway noise, abdominal muscle use.  Bronchoscopy performed as trach exchange cuffless #8 Shiley XLT showed dynamic airway collapse, tracheal collapse. 6/16 continued respiratory distress moved to ICU, changed to cuffed #6 Shiley XLT, placed on MV with some improved comfort 6/12 cefepime > 6/14, 6/18 6/12 vanc > 6/14, 6/18  6/23 mucus plugging, severe vent dyssynchrony 6/24-25 intermittent jerking, seizures on EEG 6/27 weaned off propofol gtt   Interim History / Subjective:  Patient weaned off propofol gtt; no further seizure like activity noted on LTM.  On trach collar   Objective   Blood pressure 128/80, pulse 88, temperature 97.9 F (36.6 C), temperature source Oral, resp. rate 20, height 5\' 2"  (1.575 m), weight  92.2 kg, SpO2 98 %.    Vent Mode: PRVC FiO2 (%):  [28 %] 28 % Set Rate:  [20 bmp] 20 bmp Vt Set:  [400 mL] 400 mL PEEP:  [5 cmH20] 5 cmH20 Plateau Pressure:  [16 cmH20-28 cmH20] 18 cmH20   Intake/Output Summary (Last 24 hours) at 10/17/2020 0720 Last data filed at 10/17/2020 0600 Gross per 24 hour  Intake 1040.23 ml  Output 2200 ml  Net -1159.77 ml   Filed Weights   10/15/20 0500 10/16/20 0417 10/17/20 0500  Weight: 92.2 kg 93.7 kg 92.2 kg    Examination: Constitutional: chronically ill appearing female, on mechanical ventilation  Eyes: PERRL, not tracking Ears, nose, mouth, and throat: trach in place, minimal secretions Cardiovascular: RRR, ext warm Respiratory: diminished bilaterally Gastrointestinal: soft, +BS Skin: No rashes, normal turgor Neurologic: posturing to painful stimuli and intermittently opens eyes; no purposeful movements and not following commands.  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 On Keppra and Depacon.  - AEDs and seizure suppression per neurology  Hemoptysis Improved with TXA. No further episodes and Hb stable. Will discontinue at this time.  - Trend CBC  DM2 with hyperglycemia> currently well controlled Hypertension with chronic diastolic CHF  GOC:  - Grim prognosis relayed to family multiple times, goal is quantity over quality of life; work toward vent SNF  Critical care time: 32 minutes  Eliezer Bottom, MD Internal Medicine, PGY-2 10/17/20 7:20 AM Pager # [phone]

## 2020-10-17 NOTE — Progress Notes (Signed)
Patient transported to MRI and back to 2M02 without complication.

## 2020-10-17 NOTE — Progress Notes (Signed)
EEG maintenance performed.  Reapplied REF, C4, Fz, T7.  No skin breakdown observed at these electrode sites.  No skin breakdown observed at Fp2.

## 2020-10-17 NOTE — Progress Notes (Signed)
Discontinuedn cEEG study.  Notified [facility] monitoring.  No skin breakdown observed.

## 2020-10-18 LAB — CBC
HCT: 30.1 % — ABNORMAL LOW (ref 36.0–46.0)
Hemoglobin: 9.5 g/dL — ABNORMAL LOW (ref 12.0–15.0)
MCH: 27.2 pg (ref 26.0–34.0)
MCHC: 31.6 g/dL (ref 30.0–36.0)
MCV: 86.2 fL (ref 80.0–100.0)
Platelets: 223 10*3/uL (ref 150–400)
RBC: 3.49 MIL/uL — ABNORMAL LOW (ref 3.87–5.11)
RDW: 17 % — ABNORMAL HIGH (ref 11.5–15.5)
WBC: 9.4 10*3/uL (ref 4.0–10.5)
nRBC: 0 % (ref 0.0–0.2)

## 2020-10-18 LAB — BASIC METABOLIC PANEL
Anion gap: 5 (ref 5–15)
BUN: 13 mg/dL (ref 8–23)
CO2: 26 mmol/L (ref 22–32)
Calcium: 10.1 mg/dL (ref 8.9–10.3)
Chloride: 107 mmol/L (ref 98–111)
Creatinine, Ser: 0.41 mg/dL — ABNORMAL LOW (ref 0.44–1.00)
GFR, Estimated: >60 mL/min (ref >60)
Glucose, Bld: 145 mg/dL — ABNORMAL HIGH (ref 70–99)
Potassium: 4 mmol/L (ref 3.5–5.1)
Sodium: 138 mmol/L (ref 135–145)

## 2020-10-18 LAB — GLUCOSE, CAPILLARY
Glucose-Capillary: 143 mg/dL — ABNORMAL HIGH (ref 70–99)
Glucose-Capillary: 97 mg/dL (ref 70–99)
Glucose-Capillary: 128 mg/dL — ABNORMAL HIGH (ref 70–99)
Glucose-Capillary: 110 mg/dL — ABNORMAL HIGH (ref 70–99)
Glucose-Capillary: 107 mg/dL — ABNORMAL HIGH (ref 70–99)
Glucose-Capillary: 132 mg/dL — ABNORMAL HIGH (ref 70–99)

## 2020-10-18 LAB — TRIGLYCERIDES: Triglycerides: 284 mg/dL — ABNORMAL HIGH (ref <150)

## 2020-10-18 MED ORDER — POLYETHYLENE GLYCOL 3350 17 G PO PACK
17.0000 g | PACK | Freq: Every day | ORAL | Status: DC | PRN
  Administered 2020-10-18 – 2020-11-05 (×4): 17 g
  Filled 2020-10-18 (×3): qty 1

## 2020-10-18 MED ORDER — SENNOSIDES-DOCUSATE SODIUM 8.6-50 MG PO TABS
1.0000 | ORAL_TABLET | Freq: Every evening | ORAL | Status: DC | PRN
  Administered 2020-11-03 – 2020-11-04 (×2): 1
  Filled 2020-10-18 (×2): qty 1

## 2020-10-18 NOTE — Progress Notes (Addendum)
NCM f/u on Vent /SNF referrals.[facility] , patient's insurance not in network, [facility]. without beds, and [facility] , more clinicals requested and faxed by NCM.Determination should be received by  [facility] today. TOC team will continue to monitor and assist with needs. Gae Gallop RN,BSN,CM

## 2020-10-18 NOTE — Progress Notes (Signed)
NAME:  Sonia Drake, MRN:  [id number], DOB:  10/06/1955, LOS: 17 ADMISSION DATE:  October 03, 2020, CONSULTATION DATE:  6/15 REFERRING MD:  Thedore Mins, CHIEF COMPLAINT:  Dyspnea   History of Present Illness:  65 y/o female with anoxic brain injury, tracheostomy status who was admitted from a SNF on 6/12 with worsening respiratory failure.  She had previously been ventilator dependent but had been weaned form the trach.  On 6/16 a bronchoscopy revealed dynamic collapse of her trachea, tracheostomy was replaced.  Later required mechanical ventilation.   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 No distress. WBC ct trending down. No events overnight growing GPC clusters in two of two cultures  6/16 increased work of breathing, upper airway noise, abdominal muscle use.  Bronchoscopy performed as trach exchange cuffless #8 Shiley XLT showed dynamic airway collapse, tracheal collapse. 6/16 continued respiratory distress moved to ICU, changed to cuffed #6 Shiley XLT, placed on MV with some improved comfort 6/12 cefepime > 6/14, 6/18 6/12 vanc > 6/14, 6/18  6/23 mucus plugging, severe vent dyssynchrony 6/24-25 intermittent jerking, seizures on EEG 6/27 weaned off propofol gtt  6/28 briefly tolerated trach collar in AM  Interim History / Subjective:  Patient briefly tolerated trach collar yesterday AM. No further seizure like activity noted. MRI brain without acute abnormality. Patient is on trach collar this morning.   Objective   Blood pressure  133/89, pulse 87, temperature (!) 97.3 F (36.3 C), temperature source Axillary, resp. rate 20, height 5\' 2"  (1.575 m), weight 87.4 kg, SpO2 100 %.    Vent Mode: PRVC FiO2 (%):  [28 %] 28 % Set Rate:  [20 bmp] 20 bmp Vt Set:  [400 mL] 400 mL PEEP:  [5 cmH20] 5 cmH20 Pressure Support:  [10 cmH20-14 cmH20] 14 cmH20 Plateau Pressure:  [14 cmH20-33 cmH20] 18 cmH20   Intake/Output Summary (Last 24 hours) at 10/18/2020 0724 Last data filed at 10/18/2020 0630 Gross per 24 hour  Intake 648.06 ml  Output 1000 ml  Net -351.94 ml   Filed Weights   10/16/20 0417 10/17/20 0500 10/18/20 0500  Weight: 93.7 kg 92.2 kg 87.4 kg    Examination: Constitutional: chronically ill appearing female, on mechanical ventilation via tracheostomy Eyes: PERRL, not tracking Ears, nose, mouth, and throat: trach in place, minimal secretions Cardiovascular: RRR, ext warm Respiratory: CTAB, diminished at lung bases Gastrointestinal: soft, +BS Skin: No rashes, normal turgor Neurologic: eyes open, PERRL, corneal reflex and gag reflex present, no purposeful movements and not following commands.  Labs/imaging that I havepersonally reviewed  (right click and "Reselect all SmartList Selections" daily)  VQ scan 6/15> Neg for acute PE CXR 6/16 > New L midlung atelectasis CXR 6/18>  Bibasilar atelectasis  CT Head wo Contrast 6/24 > No definite acute intracranial abnormality, progressive cerebral atrophy w/chronic small vessel ischemic disease, acute L sphenoid sinusitis MRI Brain wo Contrast 6/28> No acute intracranial abnormality advanced atrophy   Resolved Hospital Problem list   Circulatory shock  Assessment & Plan:  Acute on chronic respiratory failure with hypoxemia requiring ventilator support  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 Currently on trach collar. Currently stable  for d/c to vent-SNF to continue to attempt to wean off vent.  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 On Keppra and Depacon. MRI Brain without acute abnormalities noted. Has remained seizure-free.  - AEDs and seizure suppression per neurology  Hemoptysis Improved with TXA. No further episodes and Hb stable.  - Patient is hard stick, will do lab holiday for now   DM2 with hyperglycemia> currently well controlled Hypertension with chronic diastolic CHF  GOC:  - Grim prognosis relayed to family multiple times, goal is quantity over quality of life; work toward vent SNF  Critical care time: 32 minutes  Eliezer Bottom, MD Internal Medicine, PGY-2 10/18/20 7:24 AM Pager # [phone]

## 2020-10-18 NOTE — Progress Notes (Signed)
Nutrition Follow-up  DOCUMENTATION CODES:   Not applicable  INTERVENTION:   Continue tube feeds via PEG: - Jevity 1.5 @ 45 ml/hr (1080 ml/day) - ProSource TF 45 ml BID  Tube feeding regimen provides 1700 kcal, 91 grams of protein, and 821 ml of H2O.   NUTRITION DIAGNOSIS:   Increased nutrient needs related to wound healing as evidenced by estimated needs.  Ongoing, being addressed via TF  GOAL:   Patient will meet greater than or equal to 90% of their needs  Met via TF  MONITOR:   Vent status, Labs, Weight trends, TF tolerance, Skin, I & O's  REASON FOR ASSESSMENT:   Consult Enteral/tube feeding initiation and management  ASSESSMENT:   Sonia Drake is a 65 y.o. female with medical history significant of asthma/COPD, CHF, cardiac arrest with PEA/CPR with chronic respiratory failure on permanent trach with supplemental oxygen at 6L, type 2 DM, GERD, seizures and severe hypoxic-ischemic encephalopathy who presented to ER via EMS for respiratory distress from SNF.  Pt with anoxic brain injury. Per notes, pt briefly tolerated trach collar yesterday morning. No further seizure-like activity noted. Pt on trach collar again for a short time this morning. Pt is currently stable for d/c to vent SNF per notes.  Noted pt with ongoing type 7 bowel movements via rectal tube. Nutrisource fiber ordered. Noted that senna and miralax are ordered. Discussed with MD who will adjust orders.  Tube feeds infusing as ordered via PEG and pt tolerating without issue. Will continue with current tube feeding regimen.  Admit weight: 88.4 kg Current weight: 87.4 kg  At time of RD visit, patient on ventilator support via trach MV: 7.7 L/min Temp (24hrs), Avg:98 F (36.7 C), Min:97.1 F (36.2 C), Max:98.6 F (37 C)  Medications reviewed and include: pepcid 20 mg BID, nutrisource fiber BID, SSI q 4 hours, novolog 5 units q 4 hours, lantus 40 units daily, miralax, senna  Labs reviewed:  hemoglobin 9.7 CBG's: 97-148 x 24 hours  UOP: 1000 ml x 24 hours I/O's: +11.7 L since admit  Diet Order:   Diet Order             Diet NPO time specified  Diet effective now                   EDUCATION NEEDS:   No education needs have been identified at this time  Skin:  Skin Assessment: Skin Integrity Issues: Stage II: buttocks  Last BM:  10/18/20 type 7 via rectal tube  Height:   Ht Readings from Last 1 Encounters:  10/12/20 5' 2"  (1.575 m)    Weight:   Wt Readings from Last 1 Encounters:  10/18/20 87.4 kg    Ideal Body Weight:  45.5 kg  BMI:  Body mass index is 35.24 kg/m.  Estimated Nutritional Needs:   Kcal:  1600-1800  Protein:  90-105 grams  Fluid:  > 1.6 L    Gustavus Bryant, MS, RD, LDN Inpatient Clinical Dietitian Please see AMiON for contact information.

## 2020-10-19 DIAGNOSIS — R0902 Hypoxemia: Secondary | ICD-10-CM

## 2020-10-19 LAB — GLUCOSE, CAPILLARY
Glucose-Capillary: 158 mg/dL — ABNORMAL HIGH (ref 70–99)
Glucose-Capillary: 138 mg/dL — ABNORMAL HIGH (ref 70–99)
Glucose-Capillary: 111 mg/dL — ABNORMAL HIGH (ref 70–99)
Glucose-Capillary: 77 mg/dL (ref 70–99)
Glucose-Capillary: 129 mg/dL — ABNORMAL HIGH (ref 70–99)
Glucose-Capillary: 174 mg/dL — ABNORMAL HIGH (ref 70–99)

## 2020-10-19 NOTE — Progress Notes (Signed)
NAME:  Sonia Drake, MRN:  [id number], DOB:  [birth year], LOS: 18 ADMISSION DATE:  10/15/20, CONSULTATION DATE:  6/15 REFERRING MD:  Thedore Mins, CHIEF COMPLAINT:  Dyspnea   History of Present Illness:  65 y/o female with anoxic brain injury, tracheostomy status who was admitted from a SNF on 6/12 with worsening respiratory failure.  She had previously been ventilator dependent but had been weaned form the trach.  On 6/16 a bronchoscopy revealed dynamic collapse of her trachea, tracheostomy was replaced.  Later required mechanical ventilation.   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 No distress. WBC ct trending down. No events overnight growing GPC clusters in two of two cultures  6/16 increased work of breathing, upper airway noise, abdominal muscle use.  Bronchoscopy performed as trach exchange cuffless #8 Shiley XLT showed dynamic airway collapse, tracheal collapse. 6/16 continued respiratory distress moved to ICU, changed to cuffed #6 Shiley XLT, placed on MV with some improved comfort 6/12 cefepime > 6/14, 6/18 6/12 vanc > 6/14, 6/18  6/23 mucus plugging, severe vent dyssynchrony 6/24-25 intermittent jerking, seizures on EEG 6/27 weaned off propofol gtt  6/28 briefly tolerated trach collar in AM 6/29 tolerated trach collar in AM; no further seizure like activity; working with CM/SW for vent-SNF  Interim History / Subjective:  Patient tolerated trach collar ~4 hours yesterday. No further seizure-like activity noted.   Objective    Blood pressure (!) 140/93, pulse 92, temperature 98.3 F (36.8 C), temperature source Axillary, resp. rate (!) 21, height 5\' 2"  (1.575 m), weight 87.4 kg, SpO2 100 %.    Vent Mode: PRVC FiO2 (%):  [28 %] 28 % Set Rate:  [20 bmp] 20 bmp Vt Set:  [400 mL] 400 mL PEEP:  [5 cmH20] 5 cmH20 Pressure Support:  [12 cmH20] 12 cmH20 Plateau Pressure:  [14 cmH20-28 cmH20] 14 cmH20   Intake/Output Summary (Last 24 hours) at 10/19/2020 10/21/2020 Last data filed at 10/19/2020 0600 Gross per 24 hour  Intake 582.53 ml  Output 150 ml  Net 432.53 ml   Filed Weights   10/17/20 0500 10/18/20 0500 10/19/20 0453  Weight: 92.2 kg 87.4 kg 87.4 kg    Examination: Constitutional: chronically ill appearing female, on mechanical ventilation via tracheostomy Eyes: PERRL, not tracking, spontaneously opens eyes  Ears, nose, mouth, and throat: trach in place, minimal secretions Cardiovascular: RRR, ext warm Respiratory: CTAB, diminished at lung bases Gastrointestinal: soft, +BS Skin: No rashes, normal turgor Neurologic: eyes open, PERRL, corneal reflex and gag reflex present, spontaneously opens eyes but not tracking; no purposeful movements and not following commands.  Labs/imaging that I havepersonally reviewed  (right click and "Reselect all SmartList Selections" daily)  VQ scan 6/15> Neg for acute PE CXR 6/16 > New L midlung atelectasis CXR 6/18>  Bibasilar atelectasis  CT Head wo Contrast 6/24 > No definite acute intracranial abnormality, progressive cerebral atrophy w/chronic small vessel ischemic disease, acute L sphenoid sinusitis MRI Brain wo Contrast 6/28> No acute intracranial abnormality advanced atrophy   Resolved Hospital Problem list   Circulatory shock  Hemoptysis Assessment & Plan:  Acute on chronic respiratory failure with hypoxemia  requiring ventilator support  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 Tolerating intermittent trials of trach collar. Currently on trach collar. Currently stable for d/c to vent-SNF to continue to attempt to wean off vent.  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 No further seizure-like activity noted.  - Continue Keppra 2500mg  bid, Depakote 750mg  bid, and Klonopin 1mg  bid - If seizures recur, increase klonopin or check depakote levels and increase dosing  GOC:  - Grim prognosis relayed to family multiple times, goal is quantity over quality of life; work toward vent SNF  Critical care time: 30 minutes  , MD Internal Medicine, PGY-2 10/19/20 7:12 AM Pager # [phone]

## 2020-10-20 DIAGNOSIS — J9621 Acute and chronic respiratory failure with hypoxia: Secondary | ICD-10-CM | POA: Diagnosis not present

## 2020-10-20 LAB — GLUCOSE, CAPILLARY
Glucose-Capillary: 123 mg/dL — ABNORMAL HIGH (ref 70–99)
Glucose-Capillary: 88 mg/dL (ref 70–99)
Glucose-Capillary: 116 mg/dL — ABNORMAL HIGH (ref 70–99)
Glucose-Capillary: 95 mg/dL (ref 70–99)
Glucose-Capillary: 122 mg/dL — ABNORMAL HIGH (ref 70–99)
Glucose-Capillary: 109 mg/dL — ABNORMAL HIGH (ref 70–99)

## 2020-10-20 MED ORDER — METOPROLOL TARTRATE 12.5 MG HALF TABLET
12.5000 mg | ORAL_TABLET | Freq: Two times a day (BID) | ORAL | Status: DC
  Administered 2020-10-20 – 2020-11-08 (×24): 12.5 mg
  Filled 2020-10-20 (×33): qty 1

## 2020-10-20 MED ORDER — MAGNESIUM SULFATE 2 GM/50ML IV SOLN
2.0000 g | Freq: Once | INTRAVENOUS | Status: AC
  Administered 2020-10-20: 2 g via INTRAVENOUS
  Filled 2020-10-20: qty 50

## 2020-10-20 NOTE — TOC Progression Note (Addendum)
Transition of Care [facility]) - Progression Note    Patient Details  Name: Sonia Drake MRN: [id number] Date of Birth: 1955-09-02  [facility] of [facility] CM/SW Contact  Epifanio Lesches, RN Phone Number: 10/20/2020, 4:39 PM  Clinical Narrative:    NCM f/u with [facility] SNF via text and phone calls with voice messages left. Awaiting response...determination for Vent /SNF placement. Per facility Clydie Braun ( admission liaison) has left for the weekend. TOC team will continue to monitor and assist with needs...   Expected Discharge Plan: Skilled Nursing Facility Barriers to Discharge: No SNF bed  Expected Discharge Plan and Services Expected Discharge Plan: Skilled Nursing Facility   Discharge Planning Services: CM Consult                                           Social Determinants of Health (SDOH) Interventions    Readmission Risk Interventions No flowsheet data found.

## 2020-10-20 NOTE — Progress Notes (Addendum)
NAME:  Sonia Drake, MRN:  [id number], DOB:  11/07/1955, LOS: 19 ADMISSION DATE:  09/30/2020, CONSULTATION DATE:  6/15 REFERRING MD:  Thedore Mins, CHIEF COMPLAINT:  Dyspnea   History of Present Illness:  65 y/o female with anoxic brain injury, tracheostomy status who was admitted from a SNF on 6/12 with worsening respiratory failure.  She had previously been ventilator dependent but had been weaned form the trach.  On 6/16 a bronchoscopy revealed dynamic collapse of her trachea, tracheostomy was replaced.  Later required mechanical ventilation.   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 No distress. WBC ct trending down. No events overnight growing GPC clusters in two of two cultures  6/16 increased work of breathing, upper airway noise, abdominal muscle use.  Bronchoscopy performed as trach exchange cuffless #8 Shiley XLT showed dynamic airway collapse, tracheal collapse. 6/16 continued respiratory distress moved to ICU, changed to cuffed #6 Shiley XLT, placed on MV with some improved comfort 6/12 cefepime > 6/14, 6/18 6/12 vanc > 6/14, 6/18  6/23 mucus plugging, severe vent dyssynchrony 6/24-25 intermittent jerking, seizures on EEG 6/27 weaned off propofol gtt  6/28 briefly tolerated trach collar in AM 6/29 tolerated trach collar in AM; no further seizure like activity; working with CM/SW for vent-SNF 7/1 Weaning on CPAP/ PS 10/5, CM SW working on [employer], NO seizure activity, secretions are not an issue per nursing  Interim History /  Subjective:  Weaning on  CPAP/PS 10/5, no further seizure activity Did go all day on ATC 6/30 Net + 12 L >> Will check CXR in am to ensure no edema   Objective   Blood pressure 118/86, pulse 83, temperature 98.2 F (36.8 C), temperature source Axillary, resp. rate 19, height 5\' 2"  (1.575 m), weight 88.3 kg, SpO2 100 %.    Vent Mode: PSV;CPAP FiO2 (%):  [28 %] 28 % Set Rate:  [20 bmp] 20 bmp Vt Set:  [400 mL] 400 mL PEEP:  [5 cmH20] 5 cmH20 Pressure Support:  [10 cmH20] 10 cmH20 Plateau Pressure:  [14 cmH20-21 cmH20] 14 cmH20   Intake/Output Summary (Last 24 hours) at 10/20/2020 0820 Last data filed at 10/20/2020 0700 Gross per 24 hour  Intake 1122.49 ml  Output 825 ml  Net 297.49 ml   Filed Weights   10/18/20 0500 10/19/20 0453 10/20/20 0411  Weight: 87.4 kg 87.4 kg 88.3 kg    Examination: Constitutional: chronically ill appearing female, on mechanical ventilation via tracheostomy Eyes: PERRL, not tracking, Not focusing, eyes open spontaneously Ears, nose, mouth, and throat: trach secure and in place, minimal secretions Cardiovascular: S1, S2, RRR, No MRG, ext warm Respiratory: Bilateral chest excursion, coarse with rhonchi, diminished per bases Gastrointestinal: soft, NT, ND, +BS Skin: No rashes, warm and dry, normal turgor, intact Neurologic: eyes open, PERRL, corneal reflex and gag reflex present, spontaneously opens eyes but not tracking; no purposeful movements and not following commands.  Labs/imaging that I havepersonally reviewed  (right click and "Reselect all SmartList Selections" daily)  VQ scan 6/15> Neg for acute PE CXR 6/16 > New L midlung atelectasis CXR 6/18>  Bibasilar atelectasis  CT Head  wo Contrast 6/24 > No definite acute intracranial abnormality, progressive cerebral atrophy w/chronic small vessel ischemic disease, acute L sphenoid sinusitis MRI Brain wo Contrast 6/28> No acute intracranial abnormality advanced atrophy   Resolved Hospital Problem list    Circulatory shock  Hemoptysis Assessment & Plan:  Acute on chronic respiratory failure with hypoxemia requiring ventilator support  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 Tolerating intermittent trials of trach collar. Currently on trach collar. Currently stable for d/c to vent-SNF to continue to attempt to wean off vent.  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 No further seizure-like activity noted.  - Continue Keppra 2500mg  bid, Depakote 750mg  bid, and Klonopin 1mg  bid - If seizures recur, increase klonopin or check depakote levels and increase dosing - pharmacy to check levels ( valproate) to ensure levels are within therapeutic range. - Monitor Mag and replete as needed  Soft BP 7/1 On metoprolol 25 BID Plan Will decrease dose of Metoprolol to 12.5 BID  GOC:  - Grim prognosis relayed to family multiple times, goal is quantity over quality of life; work toward vent SNF  Lab holiday 6/30 and 7/1, will  check labs 7/2  Critical care time: 30 minutes 7/30, MSN, AGACNP-BC  Pulmonary/Critical Care Medicine See Amion for personal pager PCCM on call pager [phone] >> Hospital Use only 10/20/20 8:20 AM

## 2020-10-20 NOTE — Progress Notes (Addendum)
eLink Physician-Brief Progress Note Patient Name: Sonia Drake DOB: Dec 18, 1955 MRN: [id number]   Date of Service  10/20/2020  HPI/Events of Note  Nursing concerned about worsening papular rash over chest and face. Etiology ? Related to Depacon?   eICU Interventions  Reluctant to stop Depacon now that patient is seizure free. Defer management to PCCM day rounding team.      Intervention Category Major Interventions: Other:  Lenell Antu 10/20/2020, 9:29 PM

## 2020-10-20 NOTE — Progress Notes (Signed)
RT at bedside to find pt w/increased WOB. RT placed pt back on full ventilatory support. Pt appears more comfortable now w/SVS. Family at bedside at this time. RT will continue to monitor pt.

## 2020-10-20 DEATH — deceased

## 2020-10-21 ENCOUNTER — Inpatient Hospital Stay (HOSPITAL_COMMUNITY): Payer: Medicare Other

## 2020-10-21 DIAGNOSIS — K219 Gastro-esophageal reflux disease without esophagitis: Secondary | ICD-10-CM | POA: Diagnosis not present

## 2020-10-21 DIAGNOSIS — R569 Unspecified convulsions: Secondary | ICD-10-CM | POA: Diagnosis not present

## 2020-10-21 DIAGNOSIS — J9621 Acute and chronic respiratory failure with hypoxia: Secondary | ICD-10-CM | POA: Diagnosis not present

## 2020-10-21 LAB — CBC
HCT: 35.9 % — ABNORMAL LOW (ref 36.0–46.0)
Hemoglobin: 11.2 g/dL — ABNORMAL LOW (ref 12.0–15.0)
MCH: 27.6 pg (ref 26.0–34.0)
MCHC: 31.2 g/dL (ref 30.0–36.0)
MCV: 88.4 fL (ref 80.0–100.0)
Platelets: 213 10*3/uL (ref 150–400)
RBC: 4.06 MIL/uL (ref 3.87–5.11)
RDW: 17.2 % — ABNORMAL HIGH (ref 11.5–15.5)
WBC: 10.8 10*3/uL — ABNORMAL HIGH (ref 4.0–10.5)
nRBC: 0 % (ref 0.0–0.2)

## 2020-10-21 LAB — GLUCOSE, CAPILLARY
Glucose-Capillary: 105 mg/dL — ABNORMAL HIGH (ref 70–99)
Glucose-Capillary: 95 mg/dL (ref 70–99)
Glucose-Capillary: 108 mg/dL — ABNORMAL HIGH (ref 70–99)
Glucose-Capillary: 145 mg/dL — ABNORMAL HIGH (ref 70–99)
Glucose-Capillary: 110 mg/dL — ABNORMAL HIGH (ref 70–99)
Glucose-Capillary: 103 mg/dL — ABNORMAL HIGH (ref 70–99)

## 2020-10-21 LAB — BASIC METABOLIC PANEL
Anion gap: 8 (ref 5–15)
BUN: 12 mg/dL (ref 8–23)
CO2: 25 mmol/L (ref 22–32)
Calcium: 10.1 mg/dL (ref 8.9–10.3)
Chloride: 106 mmol/L (ref 98–111)
Creatinine, Ser: 0.45 mg/dL (ref 0.44–1.00)
GFR, Estimated: >60 mL/min (ref >60)
Glucose, Bld: 113 mg/dL — ABNORMAL HIGH (ref 70–99)
Potassium: 4.6 mmol/L (ref 3.5–5.1)
Sodium: 139 mmol/L (ref 135–145)

## 2020-10-21 LAB — MAGNESIUM: Magnesium: 2.2 mg/dL (ref 1.7–2.4)

## 2020-10-21 NOTE — Progress Notes (Signed)
PROGRESS NOTE    Sonia Drake  ZES:[id number] DOB: [birth year] DOA: 10/10/2020 PCP: Garwin Brothers, MD    Chief Complaint  Patient presents with   Respiratory Distress    Brief Narrative:  65 year old lady with anoxic brain injury from cardiac arrest, type 2 DM, COPD, seizure, s/p tracheostomy admitted from SNF on 09/28/2020 with worsening respiratory failure, was found to have dynamic collapse of the trachea on bronchoscopy on 10/05/2020.  Tracheostomy was replaced and patient required mechanical ventilation. Currently she is being weaned off mechanical ventilation and social worker looking for vent SNF for placement. Assessment & Plan:   Principal Problem:   Sepsis ([place]) Active Problems:   COPD (chronic obstructive pulmonary disease) (HCC)   Chronic diastolic CHF (congestive heart failure) (HCC)   Acute on chronic respiratory failure with hypoxia (HCC)   GERD (gastroesophageal reflux disease)   Seizures (HCC)   Type 2 diabetes mellitus without complication, with long-term current use of insulin (HCC)   Severe hypoxic-ischemic encephalopathy   Tracheostomy dependence (HCC)   Pressure injury of skin  Acute on chronic respiratory failure with hypoxia moderate quiring ventilatory support S/p tracheostomy in the setting of tracheal bronchomalacia and severe COPD. On vent support.  Pulmonary hygiene.  Continue with brovana and pulmicort.     Chronic anoxic brain injury from cardiac arrest With underlying seizure disorder On keppra  2500 mg BID and Depakote 75 mg BID and klonopin 1 mg BID.     Type 2 DM:  CBG (last 3)  Recent Labs    10/20/20 2328 10/21/20 0340 10/21/20 0731  GLUCAP 109* 105* 95  Resume lantus 40 units and and 5 units TIDAC.   On SSI.    Hypertension:  Well controlled.  On lopressor.   Hypokalemia: replaced.    Nutrition:  On tube feeds at 71m/hr.     Pressure injury present on admission.  Pressure Injury 10/02/20 Buttocks Bilateral Stage  2 -  Partial thickness loss of dermis presenting as a shallow open injury with a red, pink wound bed without slough. (Active)  10/02/20 0300  Location: Buttocks  Location Orientation: Bilateral  Staging: Stage 2 -  Partial thickness loss of dermis presenting as a shallow open injury with a red, pink wound bed without slough.  Wound Description (Comments):   Present on Admission: Yes   Wound care need to be consulted.      DVT prophylaxis: (Lovenox) Code Status: (Fullcode) Family Communication: none at bedside Disposition:   Status is: Inpatient  Remains inpatient appropriate because:Unsafe d/c plan  Dispo: The patient is from: SNF              Anticipated d/c is to: LTAC              Patient currently is not medically stable to d/c.   Difficult to place patient No       Consultants:  PCCM.  Neurology.   Siginificant Events 6/12 admitted from skilled nursing facility after aspiration event and copious secretions per tracheostomy 6/13 appears comfortable currently on 35% ATC. No distress. WBC ct trending down. No events overnight growing GPC clusters in two of two cultures  6/16 increased work of breathing, upper airway noise, abdominal muscle use.  Bronchoscopy performed as trach exchange cuffless #8 Shiley XLT showed dynamic airway collapse, tracheal collapse. 6/16 continued respiratory distress moved to ICU, changed to cuffed #6 Shiley XLT, placed on MV with some improved comfort 6/12 cefepime > 6/14, 6/18 6/12 vanc > 6/14, 6/18  6/23 mucus plugging, severe vent dyssynchrony 6/24-25 intermittent jerking, seizures on EEG 6/27 weaned off propofol gtt 6/28 briefly tolerated trach collar in AM 6/29 tolerated trach collar in AM; no further seizure like activity; working with CM/SW for vent-SNF 7/1 Weaning on CPAP/ PS 10/5, CM SW working on [employer], NO seizure activity, secretions are not an issue per nursing VQ scan 6/15> Neg for acute PE CXR 6/16 > New L midlung  atelectasis CXR 6/18>  Bibasilar atelectasis CT Head wo Contrast 6/24 > No definite acute intracranial abnormality, progressive cerebral atrophy w/chronic small vessel ischemic disease, acute L sphenoid sinusitis MRI Brain wo Contrast 6/28> No acute intracranial abnormality advanced atrophy  Antimicrobials:  Antibiotics Given (last 72 hours)     None         Subjective: Appears to be comfortable, no distress noted.   Objective: Vitals:   10/21/20 0700 10/21/20 0732 10/21/20 0800 10/21/20 0814  BP: (!) 121/91  (!) 132/92   Pulse:    98  Resp: _0 Temp:  97.8 F (36.6 C)    TempSrc:  Oral    SpO2:    100%  Weight:      Height:        Intake/Output Summary (Last 24 hours) at 10/21/2020 0909 Last data filed at 10/21/2020 0800 Gross per 24 hour  Intake 899.99 ml  Output 350 ml  Net 549.99 ml   Filed Weights   10/19/20 0453 10/20/20 0411 10/21/20 0342  Weight: 87.4 kg 88.3 kg 90 kg    Examination:  General exam: ill appearing lady, s/p trach, not in distress.  Respiratory system: s/p trach on 28% of FIO2. Air entry fair bilateral.  Cardiovascular system: S1 & S2 heard, RRR.  No pedal edema. Gastrointestinal system: Abdomen is nondistended, soft and nontender. . Normal bowel sounds heard. Central nervous system: spontaneously opens eyes, not tracking. Not following commands.  Extremities: no pedal edema.  Skin: stage 2 pressure injury on the buttocks.  Psychiatry: cannot be assessed.    Data Reviewed: I have personally reviewed following labs and imaging studies  CBC: Recent Labs  Lab 10/15/20 0632 10/16/20 0930 10/17/20 1104 10/18/20 1048 10/21/20 0118  WBC 14.6* 10.9* 11.0* 9.4 10.8*  HGB 10.4* 9.6* 9.7* 9.5* 11.2*  HCT 33.7* 30.9* 31.3* 30.1* 35.9*  MCV 88.2 86.6 87.2 86.2 88.4  PLT 243 259 227 223 440    Basic Metabolic Panel: Recent Labs  Lab 10/15/20 0632 10/16/20 0311 10/17/20 1104 10/18/20 1048 10/21/20 0118  NA 140 138 137 138 139   K 4.6 4.1 4.8 4.0 4.6  CL 106 107 101 107 106  CO2 25 20* _1 GLUCOSE 195* 179* 136* 145* 113*  BUN _2 CREATININE 0.45 0.41* 0.32* 0.41* 0.45  CALCIUM 10.5* 10.0 9.9 10.1 10.1  MG 2.0 1.9  --   --  2.2    GFR: Estimated Creatinine Clearance: 72.2 mL/min (by C-G formula based on SCr of 0.45 mg/dL).  Liver Function Tests: No results for input(s): AST, ALT, ALKPHOS, BILITOT, PROT, ALBUMIN in the last 168 hours.  CBG: Recent Labs  Lab 10/20/20 1511 10/20/20 1943 10/20/20 2328 10/21/20 0340 10/21/20 0731  GLUCAP 95 122* 109* 105* 95     No results found for this or any previous visit (from the past 240 hour(s)).       Radiology Studies: DG CHEST PORT 1 VIEW  Result Date: 10/21/2020 CLINICAL DATA:  Respiratory  failure. EXAM: PORTABLE CHEST 1 VIEW COMPARISON:  10/14/2020 FINDINGS: 0456 hours. Tracheostomy tube again noted. The lungs are clear without focal pneumonia, edema, pneumothorax or pleural effusion. Streaky opacity in the retrocardiac left base is compatible with atelectasis. Telemetry leads overlie the chest. IMPRESSION: Left basilar atelectasis is minimally progressed.  Otherwise stable. Electronically Signed   By: Misty Stanley M.D.   On: 10/21/2020 08:04        Scheduled Meds:  arformoterol  15 mcg Nebulization BID   baclofen  5 mg Per Tube TID   budesonide  0.5 mg Nebulization BID   chlorhexidine gluconate (MEDLINE KIT)  15 mL Mouth Rinse BID   Chlorhexidine Gluconate Cloth  6 each Topical Daily   clonazePAM  1 mg Per Tube BID   clopidogrel  75 mg Per Tube Daily   enoxaparin (LOVENOX) injection  0.5 mg/kg Subcutaneous Q24H   famotidine  20 mg Per Tube BID   feeding supplement (PROSource TF)  45 mL Per Tube BID   fiber  1 packet Per Tube BID   guaiFENesin  5 mL Per Tube Q6H   insulin aspart  0-15 Units Subcutaneous Q4H   insulin aspart  5 Units Subcutaneous Q4H   insulin glargine  40 Units Subcutaneous QHS   levETIRAcetam  2,500 mg  Per Tube BID   loratadine  10 mg Per Tube Daily   mouth rinse  15 mL Mouth Rinse 10 times per day   metoprolol tartrate  12.5 mg Per Tube BID   montelukast  10 mg Per Tube Daily   revefenacin  175 mcg Nebulization Daily   tiZANidine  2 mg Per Tube QHS   Continuous Infusions:  sodium chloride Stopped (10/17/20 1711)   sodium chloride     feeding supplement (JEVITY 1.5 CAL/FIBER) 1,000 mL (10/20/20 2157)   propofol (DIPRIVAN) infusion Stopped (10/16/20 1634)   valproate sodium Stopped (10/21/20 0721)     LOS: 20 days        Hosie Poisson, MD [facility]   To contact the attending provider between 7A-7P or the covering provider during after hours 7P-7A, please log into the web site [email or web address] and access using universal [place] password for that web site. If you do not have the password, please call the hospital operator.  10/21/2020, 9:09 AM

## 2020-10-22 ENCOUNTER — Inpatient Hospital Stay (HOSPITAL_COMMUNITY): Payer: Medicare Other

## 2020-10-22 DIAGNOSIS — R569 Unspecified convulsions: Secondary | ICD-10-CM | POA: Diagnosis not present

## 2020-10-22 DIAGNOSIS — K219 Gastro-esophageal reflux disease without esophagitis: Secondary | ICD-10-CM | POA: Diagnosis not present

## 2020-10-22 DIAGNOSIS — J9621 Acute and chronic respiratory failure with hypoxia: Secondary | ICD-10-CM | POA: Diagnosis not present

## 2020-10-22 LAB — GLUCOSE, CAPILLARY
Glucose-Capillary: 106 mg/dL — ABNORMAL HIGH (ref 70–99)
Glucose-Capillary: 112 mg/dL — ABNORMAL HIGH (ref 70–99)
Glucose-Capillary: 124 mg/dL — ABNORMAL HIGH (ref 70–99)
Glucose-Capillary: 140 mg/dL — ABNORMAL HIGH (ref 70–99)
Glucose-Capillary: 142 mg/dL — ABNORMAL HIGH (ref 70–99)
Glucose-Capillary: 87 mg/dL (ref 70–99)

## 2020-10-22 LAB — URINALYSIS, ROUTINE W REFLEX MICROSCOPIC
Bilirubin Urine: NEGATIVE
Glucose, UA: NEGATIVE mg/dL
Hgb urine dipstick: NEGATIVE
Ketones, ur: 5 mg/dL — AB
Leukocytes,Ua: NEGATIVE
Nitrite: NEGATIVE
Protein, ur: NEGATIVE mg/dL
Specific Gravity, Urine: 1.018 (ref 1.005–1.030)
pH: 8 (ref 5.0–8.0)

## 2020-10-22 MED ORDER — HYDROCORTISONE 1 % EX CREA
TOPICAL_CREAM | Freq: Three times a day (TID) | CUTANEOUS | Status: DC
  Administered 2020-10-28 – 2021-01-26 (×42): 1 via TOPICAL
  Filled 2020-10-22 (×5): qty 28

## 2020-10-22 MED ORDER — ACETAMINOPHEN 325 MG PO TABS
650.0000 mg | ORAL_TABLET | ORAL | Status: DC | PRN
  Administered 2020-10-22 – 2021-01-03 (×3): 650 mg
  Filled 2020-10-22 (×3): qty 2

## 2020-10-22 NOTE — Progress Notes (Signed)
PROGRESS NOTE    Sonia Drake  ALP:[id number] DOB: 03-10-1956 DOA: 09/23/2020 PCP: Garwin Brothers, MD    Chief Complaint  Patient presents with   Respiratory Distress    Brief Narrative:  65 year old lady with anoxic brain injury from cardiac arrest, type 2 DM, COPD, seizure, s/p tracheostomy admitted from SNF on 10/11/2020 with worsening respiratory failure, was found to have dynamic collapse of the trachea on bronchoscopy on 10/05/2020.  Tracheostomy was replaced and patient required mechanical ventilation. Currently she is being weaned off mechanical ventilation and social worker looking for vent SNF for placement. Assessment & Plan:   Principal Problem:   Sepsis ([place]) Active Problems:   COPD (chronic obstructive pulmonary disease) (HCC)   Chronic diastolic CHF (congestive heart failure) (HCC)   Acute on chronic respiratory failure with hypoxia (HCC)   GERD (gastroesophageal reflux disease)   Seizures (HCC)   Type 2 diabetes mellitus without complication, with long-term current use of insulin (HCC)   Severe hypoxic-ischemic encephalopathy   Tracheostomy dependence (HCC)   Pressure injury of skin  Acute on chronic respiratory failure with hypoxia requiring ventilatory support S/p tracheostomy in the setting of tracheal bronchomalacia and severe COPD. On vent support.  Pulmonary hygiene.  Continue with brovana and pulmicort.     Chronic anoxic brain injury from cardiac arrest With underlying seizure disorder On keppra  2500 mg BID and Depakote 75 mg BID and klonopin 1 mg BID.     Type 2 DM:  CBG (last 3)  Recent Labs    10/22/20 0716 10/22/20 1100 10/22/20 1514  GLUCAP 106* 112* 124*   Resume lantus 40 units and and 5 units TIDAC.  No changes in meds.  On SSI.   Fever:  Blood cultures ordered, check labs,  Get CXR and UA.    Hypertension:  Well controlled.  On lopressor.   Hypokalemia: replaced.    Nutrition:  On tube feeds at 58m/hr.      Pressure injury present on admission.  Pressure Injury 10/02/20 Buttocks Bilateral Stage 2 -  Partial thickness loss of dermis presenting as a shallow open injury with a red, pink wound bed without slough. (Active)  10/02/20 0300  Location: Buttocks  Location Orientation: Bilateral  Staging: Stage 2 -  Partial thickness loss of dermis presenting as a shallow open injury with a red, pink wound bed without slough.  Wound Description (Comments):   Present on Admission: Yes   Wound care need to be consulted.   Maculopapular rash over the chest: Blanchable.  Add hydrocortisone cream.  Probably contact dermatitis.    DVT prophylaxis: (Lovenox) Code Status: (Fullcode) Family Communication: none at bedside Disposition:   Status is: Inpatient  Remains inpatient appropriate because:Unsafe d/c plan  Dispo: The patient is from: SNF              Anticipated d/c is to: LTAC              Patient currently is not medically stable to d/c.   Difficult to place patient No       Consultants:  PCCM.  Neurology.   Siginificant Events 6/12 admitted from skilled nursing facility after aspiration event and copious secretions per tracheostomy 6/13 appears comfortable currently on 35% ATC. No distress. WBC ct trending down. No events overnight growing GPC clusters in two of two cultures  6/16 increased work of breathing, upper airway noise, abdominal muscle use.  Bronchoscopy performed as trach exchange cuffless #8 Shiley XLT showed dynamic airway collapse,  tracheal collapse. 6/16 continued respiratory distress moved to ICU, changed to cuffed #6 Shiley XLT, placed on MV with some improved comfort 6/12 cefepime > 6/14, 6/18 6/12 vanc > 6/14, 6/18 6/23 mucus plugging, severe vent dyssynchrony 6/24-25 intermittent jerking, seizures on EEG 6/27 weaned off propofol gtt 6/28 briefly tolerated trach collar in AM 6/29 tolerated trach collar in AM; no further seizure like activity; working with  CM/SW for vent-SNF 7/1 Weaning on CPAP/ PS 10/5, CM SW working on [employer], NO seizure activity, secretions are not an issue per nursing VQ scan 6/15> Neg for acute PE CXR 6/16 > New L midlung atelectasis CXR 6/18>  Bibasilar atelectasis CT Head wo Contrast 6/24 > No definite acute intracranial abnormality, progressive cerebral atrophy w/chronic small vessel ischemic disease, acute L sphenoid sinusitis MRI Brain wo Contrast 6/28> No acute intracranial abnormality advanced atrophy  Antimicrobials:  Antibiotics Given (last 72 hours)     None         Subjective: NO DISTRESS NOTED.  Objective: Vitals:   10/22/20 1500 10/22/20 1506 10/22/20 1516 10/22/20 1600  BP: 117/86   121/88  Pulse: 100   (!) 107  Resp: (!) 23   (!) 24  Temp:   (!) 101 F (38.3 C)   TempSrc:   Axillary   SpO2: 100% 100%  100%  Weight:      Height:        Intake/Output Summary (Last 24 hours) at 10/22/2020 1620 Last data filed at 10/22/2020 1600 Gross per 24 hour  Intake 654.99 ml  Output 150 ml  Net 504.99 ml    Filed Weights   10/20/20 0411 10/21/20 0342 10/22/20 0429  Weight: 88.3 kg 90 kg 89.4 kg    Examination:  General exam: Ill-appearing lady s/p trach does not appear to be in any kind of distress Respiratory system: Air entry fair, s/p trach on vent on 21% FiO2 Cardiovascular system: S1-S2 heard, tachycardic, no JVD Gastrointestinal system: Abdomen is soft nontender nondistended Central nervous system: Patient is not following commands or opening eyes today, even on tactile cues.  Extremities: no pedal edema.  Skin: stage 2 pressure injury on the buttocks.  Psychiatry: cannot be assessed.    Data Reviewed: I have personally reviewed following labs and imaging studies  CBC: Recent Labs  Lab 10/16/20 0930 10/17/20 1104 10/18/20 1048 10/21/20 0118  WBC 10.9* 11.0* 9.4 10.8*  HGB 9.6* 9.7* 9.5* 11.2*  HCT 30.9* 31.3* 30.1* 35.9*  MCV 86.6 87.2 86.2 88.4  PLT 259 227 223 213      Basic Metabolic Panel: Recent Labs  Lab 10/16/20 0311 10/17/20 1104 10/18/20 1048 10/21/20 0118  NA 138 137 138 139  K 4.1 4.8 4.0 4.6  CL 107 101 107 106  CO2 20* 23 26 25   GLUCOSE 179* 136* 145* 113*  BUN 13 11 13 12   CREATININE 0.41* 0.32* 0.41* 0.45  CALCIUM 10.0 9.9 10.1 10.1  MG 1.9  --   --  2.2     GFR: Estimated Creatinine Clearance: 71.9 mL/min (by C-G formula based on SCr of 0.45 mg/dL).  Liver Function Tests: No results for input(s): AST, ALT, ALKPHOS, BILITOT, PROT, ALBUMIN in the last 168 hours.  CBG: Recent Labs  Lab 10/21/20 2308 10/22/20 0424 10/22/20 0716 10/22/20 1100 10/22/20 1514  GLUCAP 103* 87 106* 112* 124*      No results found for this or any previous visit (from the past 240 hour(s)).       Radiology  Studies: DG CHEST PORT 1 VIEW  Result Date: 10/21/2020 CLINICAL DATA:  Respiratory failure. EXAM: PORTABLE CHEST 1 VIEW COMPARISON:  10/14/2020 FINDINGS: 0456 hours. Tracheostomy tube again noted. The lungs are clear without focal pneumonia, edema, pneumothorax or pleural effusion. Streaky opacity in the retrocardiac left base is compatible with atelectasis. Telemetry leads overlie the chest. IMPRESSION: Left basilar atelectasis is minimally progressed.  Otherwise stable. Electronically Signed   By: Misty Stanley M.D.   On: 10/21/2020 08:04        Scheduled Meds:  arformoterol  15 mcg Nebulization BID   baclofen  5 mg Per Tube TID   budesonide  0.5 mg Nebulization BID   chlorhexidine gluconate (MEDLINE KIT)  15 mL Mouth Rinse BID   Chlorhexidine Gluconate Cloth  6 each Topical Daily   clonazePAM  1 mg Per Tube BID   clopidogrel  75 mg Per Tube Daily   enoxaparin (LOVENOX) injection  0.5 mg/kg Subcutaneous Q24H   famotidine  20 mg Per Tube BID   feeding supplement (PROSource TF)  45 mL Per Tube BID   fiber  1 packet Per Tube BID   guaiFENesin  5 mL Per Tube Q6H   hydrocortisone cream   Topical TID   insulin aspart  0-15  Units Subcutaneous Q4H   insulin aspart  5 Units Subcutaneous Q4H   insulin glargine  40 Units Subcutaneous QHS   levETIRAcetam  2,500 mg Per Tube BID   loratadine  10 mg Per Tube Daily   mouth rinse  15 mL Mouth Rinse 10 times per day   metoprolol tartrate  12.5 mg Per Tube BID   montelukast  10 mg Per Tube Daily   revefenacin  175 mcg Nebulization Daily   tiZANidine  2 mg Per Tube QHS   Continuous Infusions:  sodium chloride Stopped (10/17/20 1711)   sodium chloride     feeding supplement (JEVITY 1.5 CAL/FIBER) 1,000 mL (10/22/20 0154)   propofol (DIPRIVAN) infusion Stopped (10/16/20 1634)   valproate sodium Stopped (10/22/20 0657)     LOS: 21 days        Hosie Poisson, MD [facility]   To contact the attending provider between 7A-7P or the covering provider during after hours 7P-7A, please log into the web site [email or web address] and access using universal [place] password for that web site. If you do not have the password, please call the hospital operator.  10/22/2020, 4:20 PM

## 2020-10-22 NOTE — Progress Notes (Signed)
Patient placed on PS/CPAP. Patient RR increased with increased WOB. HR in 130's. RN at bedside. RT placed patient back on full support at this time.

## 2020-10-23 ENCOUNTER — Inpatient Hospital Stay (HOSPITAL_COMMUNITY): Payer: Medicare Other

## 2020-10-23 DIAGNOSIS — R569 Unspecified convulsions: Secondary | ICD-10-CM | POA: Diagnosis not present

## 2020-10-23 DIAGNOSIS — K219 Gastro-esophageal reflux disease without esophagitis: Secondary | ICD-10-CM | POA: Diagnosis not present

## 2020-10-23 DIAGNOSIS — J9621 Acute and chronic respiratory failure with hypoxia: Secondary | ICD-10-CM | POA: Diagnosis not present

## 2020-10-23 LAB — GLUCOSE, CAPILLARY
Glucose-Capillary: 129 mg/dL — ABNORMAL HIGH (ref 70–99)
Glucose-Capillary: 85 mg/dL (ref 70–99)
Glucose-Capillary: 110 mg/dL — ABNORMAL HIGH (ref 70–99)
Glucose-Capillary: 106 mg/dL — ABNORMAL HIGH (ref 70–99)
Glucose-Capillary: 124 mg/dL — ABNORMAL HIGH (ref 70–99)
Glucose-Capillary: 186 mg/dL — ABNORMAL HIGH (ref 70–99)

## 2020-10-23 LAB — CBC WITH DIFFERENTIAL/PLATELET
Abs Immature Granulocytes: 0.09 10*3/uL — ABNORMAL HIGH (ref 0.00–0.07)
Basophils Absolute: 0 10*3/uL (ref 0.0–0.1)
Basophils Relative: 0 %
Eosinophils Absolute: 0.2 10*3/uL (ref 0.0–0.5)
Eosinophils Relative: 2 %
HCT: 30.4 % — ABNORMAL LOW (ref 36.0–46.0)
Hemoglobin: 9.5 g/dL — ABNORMAL LOW (ref 12.0–15.0)
Immature Granulocytes: 1 %
Lymphocytes Relative: 15 %
Lymphs Abs: 1.5 10*3/uL (ref 0.7–4.0)
MCH: 27 pg (ref 26.0–34.0)
MCHC: 31.3 g/dL (ref 30.0–36.0)
MCV: 86.4 fL (ref 80.0–100.0)
Monocytes Absolute: 0.5 10*3/uL (ref 0.1–1.0)
Monocytes Relative: 5 %
Neutro Abs: 7.4 10*3/uL (ref 1.7–7.7)
Neutrophils Relative %: 77 %
Platelets: 245 10*3/uL (ref 150–400)
RBC: 3.52 MIL/uL — ABNORMAL LOW (ref 3.87–5.11)
RDW: 17.2 % — ABNORMAL HIGH (ref 11.5–15.5)
WBC: 9.7 10*3/uL (ref 4.0–10.5)
nRBC: 0 % (ref 0.0–0.2)

## 2020-10-23 MED ORDER — CLONAZEPAM 0.5 MG PO TABS
0.5000 mg | ORAL_TABLET | Freq: Two times a day (BID) | ORAL | Status: DC
  Administered 2020-10-23 – 2020-12-11 (×98): 0.5 mg
  Filled 2020-10-23 (×98): qty 1

## 2020-10-23 MED ORDER — GERHARDT'S BUTT CREAM
TOPICAL_CREAM | Freq: Two times a day (BID) | CUTANEOUS | Status: DC
  Administered 2020-10-28 – 2021-01-26 (×28): 1 via TOPICAL
  Filled 2020-10-23 (×7): qty 1

## 2020-10-23 NOTE — Consult Note (Signed)
WOC Nurse Consult Note: Patient receiving care in [facility] 2M02 Reason for Consult: Sacrum Wound type: MASD/IAD of the buttock area Wound bed: Pink and moist Periwound: Intact Dressing procedure/placement/frequency: Cleanse the buttocks with no rinse cleanser, pat dry and apply Gerhardt's butt cream to the are twice daily or PRN soiling.   Monitor the wound area(s) for worsening of condition such as: Signs/symptoms of infection, increase in size, development of or worsening of odor, development of pain, or increased pain at the affected locations.   Notify the medical team if any of these develop.  Thank you for the consult. WOC nurse will not follow at this time.   Please re-consult the WOC team if needed.  Renaldo Reel Katrinka Blazing, MSN, RN, CMSRN, Angus Seller, [facility] Wound Treatment Associate Pager [phone]

## 2020-10-23 NOTE — Progress Notes (Signed)
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  Tracheostomy was replaced and patient required mechanical ventilation. Currently she is being weaned off mechanical ventilation and social worker looking for vent SNF for placement. Assessment & Plan:   Principal Problem:   Sepsis ([place]) Active Problems:   COPD (chronic obstructive pulmonary disease) (HCC)   Chronic diastolic CHF (congestive heart failure) (HCC)   Acute on chronic respiratory failure with hypoxia (HCC)   GERD (gastroesophageal reflux disease)   Seizures (HCC)   Type 2 diabetes mellitus without complication, with long-term current use of insulin (HCC)   Severe hypoxic-ischemic encephalopathy   Tracheostomy dependence (HCC)   Pressure injury of skin  Acute on chronic respiratory failure with hypoxia requiring ventilatory support S/p tracheostomy in the setting of tracheal bronchomalacia and severe COPD. On vent support.  Pulmonary hygiene.  Continue with brovana and pulmicort.     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 Will decrease the dose of the Klonipin to 0.5 mg BID today.  NO Seizures this admission.    Type 2 DM:  CBG (last 3)  Recent Labs    10/22/20 2334 10/23/20 0315 10/23/20 0712  GLUCAP 142* 129* 85   Resume lantus 40 units and and 5 units TIDAC, continue with SSI.   Fever:  Blood cultures ordered, check labs pending,. CXR and UA  negative for infection.    Hypertension:  Well controlled.  On lopressor.   Hypokalemia: replaced.    Nutrition:  On tube feeds at 14m/hr.     Pressure injury present on admission.  Pressure Injury 10/02/20 Buttocks Bilateral Stage 2 -  Partial thickness loss of dermis presenting as a shallow open injury with a red, pink wound bed without slough. (Active)  10/02/20 0300  Location: Buttocks  Location Orientation: Bilateral  Staging: Stage 2 -  Partial thickness loss of dermis presenting as a shallow open injury with a red, pink wound bed without slough.  Wound Description (Comments):   Present on Admission: Yes   Wound care consulted and recommendations given.   Maculopapular rash over the chest: Blanchable. Improving with hydrocortisone cream.  Probably contact dermatitis.    DVT prophylaxis: (Lovenox) Code Status: (Fullcode) Family Communication: none at bedside Disposition:   Status is: Inpatient  Remains inpatient appropriate because:Unsafe d/c plan  Dispo: The patient is from: SNF              Anticipated d/c is to: LTAC              Patient currently is not medically stable to d/c.   Difficult to place patient No       Consultants:  PCCM.  Neurology.   Siginificant Events 6/12 admitted from skilled nursing facility after aspiration event and copious secretions per tracheostomy 6/13 appears comfortable currently on 35% ATC. No distress. WBC ct trending down. No events overnight growing GPC clusters in two of two cultures  6/16 increased work of breathing, upper airway noise, abdominal muscle use.  Bronchoscopy performed as trach exchange cuffless #8 Shiley XLT showed dynamic airway collapse, tracheal collapse. 6/16 continued respiratory distress moved to ICU, changed to cuffed #6 Shiley XLT, placed on MV with some improved comfort 6/12 cefepime > 6/14, 6/18 6/12 vanc > 6/14, 6/18 6/23 mucus plugging, severe vent dyssynchrony 6/24-25 intermittent  jerking, seizures on EEG 6/27 weaned off propofol gtt 6/28 briefly tolerated trach collar in AM 6/29 tolerated trach collar in AM; no further seizure like activity; working with CM/SW for vent-SNF 7/1 Weaning on CPAP/ PS 10/5, CM SW working on [employer], NO seizure activity, secretions are not an issue per nursing VQ scan 6/15> Neg for acute PE CXR 6/16 > New L midlung atelectasis CXR 6/18>  Bibasilar atelectasis CT Head wo Contrast 6/24 > No definite acute intracranial abnormality, progressive cerebral atrophy w/chronic small vessel ischemic disease, acute L sphenoid sinusitis MRI Brain wo Contrast 6/28> No acute intracranial abnormality advanced atrophy  Antimicrobials:  Antibiotics Given (last 72 hours)     None         Subjective:  No fever, .  Objective: Vitals:   10/23/20 0751 10/23/20 0800 10/23/20 0900 10/23/20 1000  BP:  (!) 107/97 113/86 (!) 122/91  Pulse:  (!) 110 100 99  Resp:  (!) 26 20 19   Temp:      TempSrc:      SpO2: 100% 100% 99% 100%  Weight:      Height:        Intake/Output Summary (Last 24 hours) at 10/23/2020 1007 Last data filed at 10/23/2020 1000 Gross per 24 hour  Intake 1345 ml  Output 800 ml  Net 545 ml    Filed Weights   10/21/20 0342 10/22/20 0429 10/23/20 0321  Weight: 90 kg 89.4 kg 91.6 kg    Examination:  General exam: Ill-appearing lady s/p trach, no distress noted Respiratory system: Entry failure, s/p trach on vent on 21% FiO2. Cardiovascular system: S1-S2 heard, tachycardic, no JVD or no pedal edema Gastrointestinal system: Abdomen is soft nontender nondistended Central nervous system: Patient not following commands Extremities: Equal edema Skin: stage 2 pressure injury on the buttocks.  Maculopapular rash is improving no Psychiatry: cannot be assessed.    Data Reviewed: I have personally reviewed following labs and imaging studies  CBC: Recent Labs  Lab 10/17/20 1104 10/18/20 1048 10/21/20 0118  WBC 11.0* 9.4 10.8*   HGB 9.7* 9.5* 11.2*  HCT 31.3* 30.1* 35.9*  MCV 87.2 86.2 88.4  PLT 227 223 213     Basic Metabolic Panel: Recent Labs  Lab 10/17/20 1104 10/18/20 1048 10/21/20 0118  NA 137 138 139  K 4.8 4.0 4.6  CL 101 107 106  CO2 23 26 25   GLUCOSE 136* 145* 113*  BUN 11 13 12   CREATININE 0.32* 0.41* 0.45  CALCIUM 9.9 10.1 10.1  MG  --   --  2.2     GFR: Estimated Creatinine Clearance: 72.8 mL/min (by C-G formula based on SCr of 0.45 mg/dL).  Liver Function Tests: No results for input(s): AST, ALT, ALKPHOS, BILITOT, PROT, ALBUMIN in the last 168 hours.  CBG: Recent Labs  Lab 10/22/20 1514 10/22/20 2035 10/22/20 2334 10/23/20 0315 10/23/20 0712  GLUCAP 124* 140* 142* 129* 85      No results found for this or any previous visit (from the past 240 hour(s)).       Radiology Studies: DG CHEST PORT 1 VIEW  Result Date: 10/23/2020 CLINICAL DATA:  Hypoxia EXAM: PORTABLE CHEST 1 VIEW COMPARISON:  10/22/2020 FINDINGS: The heart size and mediastinal contours are within normal limits. Both lungs are clear. The visualized skeletal structures are unremarkable. Tracheostomy tube tip at the level of the clavicular heads. IMPRESSION: No active disease. Electronically Signed   By: Ulyses Jarred M.D.   On: 10/23/2020 00:47   DG CHEST PORT 1 VIEW  Result Date: 10/22/2020 CLINICAL DATA:  Fever. EXAM: PORTABLE CHEST 1 VIEW COMPARISON:  10/21/2020 FINDINGS: Unchanged tracheostomy tube. Patient is rotated. Heart size is normal. Lungs are clear. Stable subsegmental atelectasis at the LEFT lung base. IMPRESSION: Minimal atelectasis in the LEFT LOWER lobe. Otherwise, No evidence for acute cardiopulmonary abnormality. Electronically Signed   By: Nolon Nations M.D.   On: 10/22/2020 16:55        Scheduled Meds:  arformoterol  15 mcg Nebulization BID   baclofen  5 mg Per Tube TID   budesonide  0.5 mg Nebulization BID   chlorhexidine gluconate (MEDLINE KIT)  15 mL Mouth Rinse BID    Chlorhexidine Gluconate Cloth  6 each Topical Daily   clonazePAM  1 mg Per Tube BID   clopidogrel  75 mg Per Tube Daily   enoxaparin (LOVENOX) injection  0.5 mg/kg Subcutaneous Q24H   famotidine  20 mg Per Tube BID   feeding supplement (PROSource TF)  45 mL Per Tube BID   fiber  1 packet Per Tube BID   Gerhardt's butt cream   Topical BID   guaiFENesin  5 mL Per Tube Q6H   hydrocortisone cream   Topical TID   insulin aspart  0-15 Units Subcutaneous Q4H   insulin aspart  5 Units Subcutaneous Q4H   insulin glargine  40 Units Subcutaneous QHS   levETIRAcetam  2,500 mg Per Tube BID   loratadine  10 mg Per Tube Daily   mouth rinse  15 mL Mouth Rinse 10 times per day   metoprolol tartrate  12.5 mg Per Tube BID   montelukast  10 mg Per Tube Daily   revefenacin  175 mcg Nebulization Daily   tiZANidine  2 mg Per Tube QHS   Continuous Infusions:  sodium chloride Stopped (10/17/20 1711)   sodium chloride     feeding supplement (JEVITY 1.5 CAL/FIBER) 1,000 mL (10/23/20 0500)   propofol (DIPRIVAN) infusion Stopped (10/16/20 1634)   valproate sodium Stopped (10/23/20 0601)     LOS: 22 days        Hosie Poisson, MD [facility]   To contact the attending provider between 7A-7P or the covering provider during after hours 7P-7A, please log into the web site [email or web address] and access using universal [place] password for that web site. If you do not have the password, please call the hospital operator.  10/23/2020, 10:07 AM

## 2020-10-23 NOTE — Progress Notes (Signed)
eLink Physician-Brief Progress Note Patient Name: Sonia Drake DOB: 04/17/1956 MRN: [id number]   Date of Service  10/23/2020  HPI/Events of Note  Increased WOB on ventilator  eICU Interventions  Plan: Portable CXR STAT.     Intervention Category Major Interventions: Other:  Daxson Reffett Dennard Nip 10/23/2020, 12:03 AM

## 2020-10-23 NOTE — Progress Notes (Signed)
Pt PIP high into 50s.  Pt lavaged and given a breathing tx d/t decreased air movement.  Pt recovering slowly.  Elink notified.

## 2020-10-24 DIAGNOSIS — J9621 Acute and chronic respiratory failure with hypoxia: Secondary | ICD-10-CM | POA: Diagnosis not present

## 2020-10-24 DIAGNOSIS — R569 Unspecified convulsions: Secondary | ICD-10-CM | POA: Diagnosis not present

## 2020-10-24 LAB — BLOOD CULTURE ID PANEL (REFLEXED) - BCID2

## 2020-10-24 LAB — CBC WITH DIFFERENTIAL/PLATELET
Abs Immature Granulocytes: 0.09 10*3/uL — ABNORMAL HIGH (ref 0.00–0.07)
Basophils Absolute: 0 10*3/uL (ref 0.0–0.1)
Basophils Relative: 0 %
Eosinophils Absolute: 0.2 10*3/uL (ref 0.0–0.5)
Eosinophils Relative: 3 %
HCT: 31.9 % — ABNORMAL LOW (ref 36.0–46.0)
Hemoglobin: 10.2 g/dL — ABNORMAL LOW (ref 12.0–15.0)
Immature Granulocytes: 1 %
Lymphocytes Relative: 21 %
Lymphs Abs: 1.9 10*3/uL (ref 0.7–4.0)
MCH: 27.6 pg (ref 26.0–34.0)
MCHC: 32 g/dL (ref 30.0–36.0)
MCV: 86.4 fL (ref 80.0–100.0)
Monocytes Absolute: 0.6 10*3/uL (ref 0.1–1.0)
Monocytes Relative: 7 %
Neutro Abs: 6.2 10*3/uL (ref 1.7–7.7)
Neutrophils Relative %: 68 %
Platelets: 247 10*3/uL (ref 150–400)
RBC: 3.69 MIL/uL — ABNORMAL LOW (ref 3.87–5.11)
RDW: 17.3 % — ABNORMAL HIGH (ref 11.5–15.5)
WBC: 9.2 10*3/uL (ref 4.0–10.5)
nRBC: 0 % (ref 0.0–0.2)

## 2020-10-24 LAB — BASIC METABOLIC PANEL
Anion gap: 8 (ref 5–15)
BUN: 13 mg/dL (ref 8–23)
CO2: 25 mmol/L (ref 22–32)
Calcium: 10.3 mg/dL (ref 8.9–10.3)
Chloride: 105 mmol/L (ref 98–111)
Creatinine, Ser: 0.43 mg/dL — ABNORMAL LOW (ref 0.44–1.00)
GFR, Estimated: >60 mL/min (ref >60)
Glucose, Bld: 135 mg/dL — ABNORMAL HIGH (ref 70–99)
Potassium: 4.7 mmol/L (ref 3.5–5.1)
Sodium: 138 mmol/L (ref 135–145)

## 2020-10-24 LAB — GLUCOSE, CAPILLARY
Glucose-Capillary: 98 mg/dL (ref 70–99)
Glucose-Capillary: 116 mg/dL — ABNORMAL HIGH (ref 70–99)
Glucose-Capillary: 136 mg/dL — ABNORMAL HIGH (ref 70–99)
Glucose-Capillary: 136 mg/dL — ABNORMAL HIGH (ref 70–99)
Glucose-Capillary: 126 mg/dL — ABNORMAL HIGH (ref 70–99)
Glucose-Capillary: 159 mg/dL — ABNORMAL HIGH (ref 70–99)

## 2020-10-24 LAB — VALPROIC ACID LEVEL: Valproic Acid Lvl: 59 ug/mL (ref 50.0–100.0)

## 2020-10-24 MED ORDER — VALPROIC ACID 250 MG/5ML PO SOLN
750.0000 mg | Freq: Two times a day (BID) | ORAL | Status: DC
  Administered 2020-10-24 – 2020-11-05 (×24): 750 mg
  Filled 2020-10-24 (×24): qty 15

## 2020-10-24 NOTE — Progress Notes (Signed)
NAME:  Sonia Drake, MRN:  [id number], DOB:  02-Dec-1955, LOS: 23 ADMISSION DATE:  09/20/2020, CONSULTATION DATE:  6/15 REFERRING MD:  Thedore Mins, CHIEF COMPLAINT:  Dyspnea   History of Present Illness:  65 y/o female with anoxic brain injury, tracheostomy status who was admitted from a SNF on 6/12 with worsening respiratory failure.  She had previously been ventilator dependent but had been weaned form the trach.  On 6/16 a bronchoscopy revealed dynamic collapse of her trachea, tracheostomy was replaced.  Later required mechanical ventilation.   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 No distress. WBC ct trending down. No events overnight growing GPC clusters in two of two cultures  6/16 increased work of breathing, upper airway noise, abdominal muscle use.  Bronchoscopy performed as trach exchange cuffless #8 Shiley XLT showed dynamic airway collapse, tracheal collapse. 6/16 continued respiratory distress moved to ICU, changed to cuffed #6 Shiley XLT, placed on MV with some improved comfort 6/12 cefepime > 6/14, 6/18 6/12 vanc > 6/14, 6/18  6/23 mucus plugging, severe vent dyssynchrony 6/24-25 intermittent jerking, seizures on EEG 6/27 weaned off propofol gtt  6/28 briefly tolerated trach collar in AM 6/29 tolerated trach collar in AM; no further seizure like activity; working with CM/SW for vent-SNF 7/1 Weaning on CPAP/ PS 10/5, CM SW working on [employer], NO seizure activity, secretions are not an issue per nursing 7/5 stable  overnight  Interim History / Subjective:  Weaning on  CPAP/PS 10/5, no further seizure activity No overnight events  Objective   Blood pressure 112/85, pulse 88, temperature (!) 97.1 F (36.2 C), temperature source Axillary, resp. rate 20, height 5\' 2"  (1.575 m), weight 89.2 kg, SpO2 100 %.    Vent Mode: PRVC FiO2 (%):  [21 %] 21 % Set Rate:  [20 bmp] 20 bmp Vt Set:  [400 mL] 400 mL PEEP:  [5 cmH20] 5 cmH20 Plateau Pressure:  [16 cmH20-22 cmH20] 22 cmH20   Intake/Output Summary (Last 24 hours) at 10/24/2020 0801 Last data filed at 10/24/2020 0600 Gross per 24 hour  Intake 1209.08 ml  Output 500 ml  Net 709.08 ml   Filed Weights   10/22/20 0429 10/23/20 0321 10/24/20 0138  Weight: 89.4 kg 91.6 kg 89.2 kg    Examination: Constitutional: Chronically ill-appearing, on mechanical ventilator Eyes: Pupils equal and reacting  Tracheostomy noted, minimal secretions  Cardiovascular: S1-S2 appreciated with no murmur Respiratory: Minimal rhonchi bilaterally  Gastrointestinal: Soft, bowel sounds appreciated Skin: Skin is warm and dry Neurologic: Does not follow commands, brainstem reflexes present  Labs/imaging that I havepersonally reviewed  (right click and "Reselect all SmartList Selections" daily)  VQ scan 6/15> Neg for acute PE CXR 6/16 > New L midlung atelectasis CXR 6/18>  Bibasilar atelectasis  CT Head wo Contrast 6/24 > No definite acute intracranial abnormality, progressive cerebral atrophy w/chronic small vessel ischemic disease, acute L sphenoid sinusitis MRI Brain wo Contrast 6/28> No acute intracranial abnormality advanced atrophy   Resolved Hospital Problem list   Circulatory shock  Hemoptysis  Assessment & Plan:  Acute on chronic respiratory failure with hypoxemia requiring ventilator support Tracheostomy dependence  Encephalopathic Tracheobronchomalacia -Dynamic airway collapse noted on bronchoscopy -Tolerating trach collar -Required ventilator support at  night -Continue VAP bundle -Continue bronchodilators including Brovana, Pulmicort, Yupelri and albuterol  Anoxic encephalopathy postcardiac arrest On antiepileptics for status epilepticus -On Keppra 2500 mg twice daily, Depakote 750 twice daily and Klonopin 1 mg twice daily -Pharmacy assisting with drug levels -Replete magnesium as needed  Hypotension -On metoprolol 12.5 twice daily  Goals of care: -Poor long-term prognosis -Skilled nursing facility on vent  Virl Diamond, MD [facility] Pager: [phone]

## 2020-10-24 NOTE — TOC Progression Note (Addendum)
Transition of Care [facility]) - Progression Note    Patient Details  Name: Sonia Drake MRN: [id number] Date of Birth: May 27, 1955  Transition of Care [facility]) CM/SW Contact  Erin Sons, [place] Phone Number: 10/24/2020, 10:42 AM  Clinical Narrative:     CSW spoke with [facility] admissions. They explained they do not do any vent weaning and only take pt's who are unable to wean. If pt meets this criteria, they may consider pt but need to clarify this first.   1120: Received update regarding weaning from RN staff. CSW called [facility] and notified her that pt will no longer be weaning after last trial. She explained she will be in touch with CSW once pt is reviewed further.   Expected Discharge Plan: Skilled Nursing Facility Barriers to Discharge: No SNF bed  Expected Discharge Plan and Services Expected Discharge Plan: Skilled Nursing Facility   Discharge Planning Services: CM Consult                                           Social Determinants of Health (SDOH) Interventions    Readmission Risk Interventions No flowsheet data found.

## 2020-10-24 NOTE — Progress Notes (Signed)
PHARMACY - PHYSICIAN COMMUNICATION CRITICAL VALUE ALERT - BLOOD CULTURE IDENTIFICATION (BCID)  Sonia Drake is an 65 y.o. female who presented to [facility] on October 09, 2020 with a chief complaint of respiratory distress.   Assessment: 15 YOF with a prolonged hospital stay in setting of acute on chronic respiratory failure - s/p trach. Blood cultures were drawn 7/4 after an isolated fever on 7/3 - with 1 of 4 bottles now growing a gram variable rod - with BCID not detecting anything - likely representative of a contaminant. Currently afebrile, WBC wnl  Name of physician (or Provider) Contacted: Blake Divine  Current antibiotics: None  Changes to prescribed antibiotics recommended:  Likely a contamination - no need to add antibiotics at this point  Results for orders placed or performed during the hospital encounter of October 09, 2020  Blood Culture ID Panel (Reflexed) (Collected: 10/23/2020 12:26 PM)  Result Value Ref Range   Enterococcus faecalis NOT DETECTED NOT DETECTED   Enterococcus Faecium NOT DETECTED NOT DETECTED   Listeria monocytogenes NOT DETECTED NOT DETECTED   Staphylococcus species NOT DETECTED NOT DETECTED   Staphylococcus aureus (BCID) NOT DETECTED NOT DETECTED   Staphylococcus epidermidis NOT DETECTED NOT DETECTED   Staphylococcus lugdunensis NOT DETECTED NOT DETECTED   Streptococcus species NOT DETECTED NOT DETECTED   Streptococcus agalactiae NOT DETECTED NOT DETECTED   Streptococcus pneumoniae NOT DETECTED NOT DETECTED   Streptococcus pyogenes NOT DETECTED NOT DETECTED   A.calcoaceticus-baumannii NOT DETECTED NOT DETECTED   Bacteroides fragilis NOT DETECTED NOT DETECTED   Enterobacterales NOT DETECTED NOT DETECTED   Enterobacter cloacae complex NOT DETECTED NOT DETECTED   Escherichia coli NOT DETECTED NOT DETECTED   Klebsiella aerogenes NOT DETECTED NOT DETECTED   Klebsiella oxytoca NOT DETECTED NOT DETECTED   Klebsiella pneumoniae NOT DETECTED NOT DETECTED   Proteus species NOT  DETECTED NOT DETECTED   Salmonella species NOT DETECTED NOT DETECTED   Serratia marcescens NOT DETECTED NOT DETECTED   Haemophilus influenzae NOT DETECTED NOT DETECTED   Neisseria meningitidis NOT DETECTED NOT DETECTED   Pseudomonas aeruginosa NOT DETECTED NOT DETECTED   Stenotrophomonas maltophilia NOT DETECTED NOT DETECTED   Candida albicans NOT DETECTED NOT DETECTED   Candida auris NOT DETECTED NOT DETECTED   Candida glabrata NOT DETECTED NOT DETECTED   Candida krusei NOT DETECTED NOT DETECTED   Candida parapsilosis NOT DETECTED NOT DETECTED   Candida tropicalis NOT DETECTED NOT DETECTED   Cryptococcus neoformans/gattii NOT DETECTED NOT DETECTED   Thank you for allowing pharmacy to be a part of this patient's care.  Georgina Pillion, PharmD, BCPS Clinical Pharmacist Clinical phone for 10/24/2020: [phone] 10/24/2020 1:57 PM   **Pharmacist phone directory can now be found on [email or web address] ([email or web address]).  Listed under [facility].

## 2020-10-24 NOTE — Progress Notes (Signed)
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  Tracheostomy was replaced and patient required mechanical ventilation. Currently she is being weaned off mechanical ventilation and social worker looking for vent SNF for placement.  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 On vent support.  Pulmonary hygiene.  Continue with brovana and pulmicort.     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 Will decrease the dose of the Klonipin to 0.5 mg BID today.  Change IV Depakote to oral Depakote NO Seizures this admission.    Type 2 DM: Insulin-dependent, well controlled CBG (last 3)  Recent Labs    10/24/20 0304 10/24/20 0706 10/24/20 1103  GLUCAP 98 116* 136*   Resume lantus 40 units and and 5 units TIDAC, continue with SSI.   Changes in medication  Fever: Resolved Blood cultures ordered, Blood cultures from 10/23/20 growing 1 out of 4 bottles Gram variable rod, BCI D is negative, probably a contaminant   CXR and UA negative for infection.    Hypertension:  Well controlled.  On lopressor.   Hypokalemia: replaced.    Nutrition:  On tube feeds at 36m/hr.     Pressure injury present on admission.  Pressure Injury 10/02/20 Buttocks Bilateral Stage 2 -  Partial thickness loss of dermis presenting as a shallow open injury with a red, pink wound bed without slough. (Active)  10/02/20 0300  Location: Buttocks  Location Orientation: Bilateral  Staging: Stage 2 -  Partial thickness loss of dermis presenting as a shallow open injury with a red, pink wound bed without slough.  Wound Description (Comments):   Present on Admission: Yes   Wound care consulted and recommendations given.   Maculopapular rash over the chest: Blanchable. Improving with hydrocortisone cream.  Probably contact dermatitis.    DVT prophylaxis: (Lovenox) Code Status: (Fullcode) Family Communication: none at bedside Disposition:   Status is: Inpatient  Remains inpatient appropriate because:Unsafe d/c plan  Dispo: The patient is from: SNF              Anticipated d/c is to: LTAC              Patient currently is not medically stable to d/c.   Difficult to place patient No       Consultants:  PCCM.  Neurology.   Siginificant Events 6/12 admitted from skilled nursing facility after  aspiration event and copious secretions per tracheostomy 6/13 appears comfortable currently on 35% ATC. No distress. WBC ct trending down. No events overnight growing GPC clusters in two of two cultures  6/16 increased work of breathing, upper airway noise, abdominal muscle use.  Bronchoscopy performed as trach exchange cuffless #8 Shiley XLT showed dynamic airway collapse, tracheal collapse. 6/16 continued respiratory distress moved to ICU,  changed to cuffed #6 Shiley XLT, placed on MV with some improved comfort 6/12 cefepime > 6/14, 6/18 6/12 vanc > 6/14, 6/18 6/23 mucus plugging, severe vent dyssynchrony 6/24-25 intermittent jerking, seizures on EEG 6/27 weaned off propofol gtt 6/28 briefly tolerated trach collar in AM 6/29 tolerated trach collar in AM; no further seizure like activity; working with CM/SW for vent-SNF 7/1 Weaning on CPAP/ PS 10/5, CM SW working on [employer], NO seizure activity, secretions are not an issue per nursing VQ scan 6/15> Neg for acute PE CXR 6/16 > New L midlung atelectasis CXR 6/18>  Bibasilar atelectasis CT Head wo Contrast 6/24 > No definite acute intracranial abnormality, progressive cerebral atrophy w/chronic small vessel ischemic disease, acute L sphenoid sinusitis MRI Brain wo Contrast 6/28> No acute intracranial abnormality advanced atrophy  Antimicrobials:  Antibiotics Given (last 72 hours)     None         Subjective:  No new complaints at this time. Objective: Vitals:   10/24/20 1100 10/24/20 1123 10/24/20 1142 10/24/20 1200  BP: 126/84   115/77  Pulse: 90   97  Resp: (!) 21   (!) 21  Temp:   98 F (36.7 C)   TempSrc:   Axillary   SpO2: 100% 100%  100%  Weight:      Height:        Intake/Output Summary (Last 24 hours) at 10/24/2020 1403 Last data filed at 10/24/2020 1200 Gross per 24 hour  Intake 1305.1 ml  Output 300 ml  Net 1005.1 ml    Filed Weights   10/22/20 0429 10/23/20 0321 10/24/20 0138  Weight: 89.4 kg 91.6 kg 89.2 kg    Examination:  General exam: Ill-appearing lady s/p trach no distress noted Respiratory system: Bilateral air entry fair, s/p trach, vent dependent on FiO2 21% Cardiovascular system: S S1-S2 heard, regular rate rhythm, no JVD, no pedal edema Gastrointestinal system: Abdomen is soft nontender nondistended bowel sounds normal Central nervous system: Patient not following commands  extremities: No pedal edema Skin: stage 2 pressure  injury on the buttocks.  Maculopapular rash is improving  Psychiatry: Cannot be assessed    Data Reviewed: I have personally reviewed following labs and imaging studies  CBC: Recent Labs  Lab 10/18/20 1048 10/21/20 0118 10/23/20 1408 10/24/20 0042  WBC 9.4 10.8* 9.7 9.2  NEUTROABS  --   --  7.4 6.2  HGB 9.5* 11.2* 9.5* 10.2*  HCT 30.1* 35.9* 30.4* 31.9*  MCV 86.2 88.4 86.4 86.4  PLT 223 213 245 247     Basic Metabolic Panel: Recent Labs  Lab 10/18/20 1048 10/21/20 0118 10/24/20 0042  NA 138 139 138  K 4.0 4.6 4.7  CL 107 106 105  CO2 26 25 25   GLUCOSE 145* 113* 135*  BUN 13 12 13   CREATININE 0.41* 0.45 0.43*  CALCIUM 10.1 10.1 10.3  MG  --  2.2  --      GFR: Estimated Creatinine Clearance: 71.7 mL/min (A) (by C-G formula based on SCr of 0.43 mg/dL (L)).  Liver Function Tests: No results for input(s): AST, ALT, ALKPHOS,  BILITOT, PROT, ALBUMIN in the last 168 hours.  CBG: Recent Labs  Lab 10/23/20 1901 10/23/20 2301 10/24/20 0304 10/24/20 0706 10/24/20 1103  GLUCAP 124* 186* 98 116* 136*      Recent Results (from the past 240 hour(s))  Culture, blood (Routine X 2) w Reflex to ID Panel     Status: Abnormal (Preliminary result)   Collection Time: 10/23/20 12:26 PM   Specimen: BLOOD LEFT HAND  Result Value Ref Range Status   Specimen Description BLOOD LEFT HAND  Final   Special Requests   Final    BOTTLES DRAWN AEROBIC AND ANAEROBIC Blood Culture adequate volume   Culture  Setup Time (A)  Final    GRAM VARIABLE ROD ANAEROBIC BOTTLE ONLY CRITICAL RESULT CALLED TO, READ BACK BY AND VERIFIED WITH: Dianna Rossetti [place] [id number] AT 1339 BY CM Performed at [facility], [place] [address] [place], [place] [address]    Culture PENDING  Incomplete   Report Status PENDING  Incomplete  Blood Culture ID Panel (Reflexed)     Status: None   Collection Time: 10/23/20 12:26 PM  Result Value Ref Range Status   Enterococcus faecalis NOT DETECTED NOT DETECTED Final    Enterococcus Faecium NOT DETECTED NOT DETECTED Final   Listeria monocytogenes NOT DETECTED NOT DETECTED Final   Staphylococcus species NOT DETECTED NOT DETECTED Final   Staphylococcus aureus (BCID) NOT DETECTED NOT DETECTED Final   Staphylococcus epidermidis NOT DETECTED NOT DETECTED Final   Staphylococcus lugdunensis NOT DETECTED NOT DETECTED Final   Streptococcus species NOT DETECTED NOT DETECTED Final   Streptococcus agalactiae NOT DETECTED NOT DETECTED Final   Streptococcus pneumoniae NOT DETECTED NOT DETECTED Final   Streptococcus pyogenes NOT DETECTED NOT DETECTED Final   A.calcoaceticus-baumannii NOT DETECTED NOT DETECTED Final   Bacteroides fragilis NOT DETECTED NOT DETECTED Final   Enterobacterales NOT DETECTED NOT DETECTED Final   Enterobacter cloacae complex NOT DETECTED NOT DETECTED Final   Escherichia coli NOT DETECTED NOT DETECTED Final   Klebsiella aerogenes NOT DETECTED NOT DETECTED Final   Klebsiella oxytoca NOT DETECTED NOT DETECTED Final   Klebsiella pneumoniae NOT DETECTED NOT DETECTED Final   Proteus species NOT DETECTED NOT DETECTED Final   Salmonella species NOT DETECTED NOT DETECTED Final   Serratia marcescens NOT DETECTED NOT DETECTED Final   Haemophilus influenzae NOT DETECTED NOT DETECTED Final   Neisseria meningitidis NOT DETECTED NOT DETECTED Final   Pseudomonas aeruginosa NOT DETECTED NOT DETECTED Final   Stenotrophomonas maltophilia NOT DETECTED NOT DETECTED Final   Candida albicans NOT DETECTED NOT DETECTED Final   Candida auris NOT DETECTED NOT DETECTED Final   Candida glabrata NOT DETECTED NOT DETECTED Final   Candida krusei NOT DETECTED NOT DETECTED Final   Candida parapsilosis NOT DETECTED NOT DETECTED Final   Candida tropicalis NOT DETECTED NOT DETECTED Final   Cryptococcus neoformans/gattii NOT DETECTED NOT DETECTED Final    Comment: Performed at [facility], [address], [place], [place] [address]  Culture, blood (Routine X 2) w  Reflex to ID Panel     Status: None (Preliminary result)   Collection Time: 10/23/20  2:04 PM   Specimen: BLOOD  Result Value Ref Range Status   Specimen Description BLOOD FOOT  Final   Special Requests   Final    BOTTLES DRAWN AEROBIC AND ANAEROBIC Blood Culture adequate volume   Culture   Final    NO GROWTH < 24 HOURS Performed at [facility], [place]  [address] [place], [place] [address]    Report Status PENDING  Incomplete         Radiology Studies: DG CHEST PORT 1 VIEW  Result Date: 10/23/2020 CLINICAL DATA:  Hypoxia EXAM: PORTABLE CHEST 1 VIEW COMPARISON:  10/22/2020 FINDINGS: The heart size and mediastinal contours are within normal limits. Both lungs are clear. The visualized skeletal structures are unremarkable. Tracheostomy tube tip at the level of the clavicular heads. IMPRESSION: No active disease. Electronically Signed   By: Ulyses Jarred M.D.   On: 10/23/2020 00:47   DG CHEST PORT 1 VIEW  Result Date: 10/22/2020 CLINICAL DATA:  Fever. EXAM: PORTABLE CHEST 1 VIEW COMPARISON:  10/21/2020 FINDINGS: Unchanged tracheostomy tube. Patient is rotated. Heart size is normal. Lungs are clear. Stable subsegmental atelectasis at the LEFT lung base. IMPRESSION: Minimal atelectasis in the LEFT LOWER lobe. Otherwise, No evidence for acute cardiopulmonary abnormality. Electronically Signed   By: Nolon Nations M.D.   On: 10/22/2020 16:55        Scheduled Meds:  arformoterol  15 mcg Nebulization BID   baclofen  5 mg Per Tube TID   budesonide  0.5 mg Nebulization BID   chlorhexidine gluconate (MEDLINE KIT)  15 mL Mouth Rinse BID   Chlorhexidine Gluconate Cloth  6 each Topical Daily   clonazePAM  0.5 mg Per Tube BID   clopidogrel  75 mg Per Tube Daily   enoxaparin (LOVENOX) injection  0.5 mg/kg Subcutaneous Q24H   famotidine  20 mg Per Tube BID   feeding supplement (PROSource TF)  45 mL Per Tube BID   fiber  1 packet Per Tube BID   Gerhardt's butt cream   Topical BID    guaiFENesin  5 mL Per Tube Q6H   hydrocortisone cream   Topical TID   insulin aspart  0-15 Units Subcutaneous Q4H   insulin aspart  5 Units Subcutaneous Q4H   insulin glargine  40 Units Subcutaneous QHS   levETIRAcetam  2,500 mg Per Tube BID   loratadine  10 mg Per Tube Daily   mouth rinse  15 mL Mouth Rinse 10 times per day   metoprolol tartrate  12.5 mg Per Tube BID   montelukast  10 mg Per Tube Daily   revefenacin  175 mcg Nebulization Daily   tiZANidine  2 mg Per Tube QHS   valproic acid  750 mg Per Tube Q12H   Continuous Infusions:  sodium chloride Stopped (10/17/20 1711)   sodium chloride     feeding supplement (JEVITY 1.5 CAL/FIBER) 1,000 mL (10/23/20 0500)     LOS: 23 days        Hosie Poisson, MD [facility]   To contact the attending provider between 7A-7P or the covering provider during after hours 7P-7A, please log into the web site [email or web address] and access using universal [place] password for that web site. If you do not have the password, please call the hospital operator.  10/24/2020, 2:03 PM

## 2020-10-25 DIAGNOSIS — Z930 Tracheostomy status: Secondary | ICD-10-CM | POA: Diagnosis not present

## 2020-10-25 DIAGNOSIS — R569 Unspecified convulsions: Secondary | ICD-10-CM | POA: Diagnosis not present

## 2020-10-25 LAB — GLUCOSE, CAPILLARY
Glucose-Capillary: 120 mg/dL — ABNORMAL HIGH (ref 70–99)
Glucose-Capillary: 99 mg/dL (ref 70–99)
Glucose-Capillary: 99 mg/dL (ref 70–99)
Glucose-Capillary: 105 mg/dL — ABNORMAL HIGH (ref 70–99)
Glucose-Capillary: 120 mg/dL — ABNORMAL HIGH (ref 70–99)
Glucose-Capillary: 128 mg/dL — ABNORMAL HIGH (ref 70–99)

## 2020-10-25 MED ORDER — JUVEN PO PACK
1.0000 | PACK | Freq: Two times a day (BID) | ORAL | Status: DC
  Administered 2020-10-25 – 2020-12-19 (×111): 1
  Filled 2020-10-25 (×111): qty 1

## 2020-10-25 NOTE — Progress Notes (Signed)
Skilled nursing facility to request  respiratory have pt put on "volume assist control mode setting" to see if she can tolerate for24-48 hours as that is the setting their vents are. and then do an ABG test  Will switch to volume control and obtain ABG in a.m.

## 2020-10-25 NOTE — TOC Progression Note (Signed)
Transition of Care [facility]) - Progression Note    Patient Details  Name: Sonia Drake MRN: [id number] Date of Birth: [birth year]  [facility] CM/SW Contact  Erin Sons, [place] Phone Number: 10/25/2020, 4:01 PM  Clinical Narrative:     Clydie Braun from [facility] called and left message with CSW. She requested that respiratory have pt put on volume assist control mode setting to see if she can tolerate for 24-48 hours and then do an ABG test. They do have a female vent bed open. Karen's direct contact # is [phone]. Tx team informed.  Expected Discharge Plan: Skilled Nursing Facility Barriers to Discharge: No SNF bed  Expected Discharge Plan and Services Expected Discharge Plan: Skilled Nursing Facility   Discharge Planning Services: CM Consult                                           Social Determinants of Health (SDOH) Interventions    Readmission Risk Interventions No flowsheet data found.

## 2020-10-25 NOTE — Progress Notes (Signed)
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  Tracheostomy was replaced and patient required mechanical ventilation. Currently she is being weaned off mechanical ventilation and social worker looking for vent SNF for placement.  Assessment & Plan:    Acute on chronic respiratory failure with hypoxia requiring ventilatory support S/p tracheostomy in the setting of tracheal bronchomalacia and severe COPD. On vent support.  Pulmonary hygiene.  Continue with brovana and pulmicort.   Anoxic brain injury from cardiac arrest With underlying seizure disorder On keppra  2500 mg BID and Depakote 750 mg BID and klonipin 1 mg BID, but the daughter reports that she is on 0.5 mg BID as needed. Will decrease the dose of the Klonipin to 0.5 mg BID today.  Change IV Depakote to oral Depakote NO Seizures this admission.    Type 2 DM: Insulin-dependent, well controlled CBG (last 3)  Recent Labs    10/25/20 0702 10/25/20 1056 10/25/20 1456  GLUCAP 99 99 105*   - lantus 40 units and and 5 units TIDAC, continue with SSI.  Changes in medication  Fever-  Resolved Blood cultures ordered, Blood cultures from 10/23/20 growing 1 out of 4 bottles Gram variable rod, BCI D is negative, probably a contaminant   CXR and UA negative for infection.    Hypertension:  On lopressor- BP low today- holder parameters added   Hypokalemia: replaced.    Nutrition:  On tube feeds at 30m/hr.     Pressure injury present on admission.  Pressure Injury 10/02/20 Buttocks Bilateral Stage 2 -  Partial thickness loss of dermis presenting as a shallow open injury with a red, pink wound bed  without slough. (Active)  10/02/20 0300  Location: Buttocks  Location Orientation: Bilateral  Staging: Stage 2 -  Partial thickness loss of dermis presenting as a shallow open injury with a red, pink wound bed without slough.  Wound Description (Comments):   Present on Admission: Yes      Maculopapular rash over the chest: Blanchable. Improving with hydrocortisone cream.  Probably contact dermatitis.    DVT prophylaxis: (Lovenox) Code Status: (Fullcode) Family Communication: none at bedside Disposition:   Status is: Inpatient  Remains inpatient appropriate because:Unsafe d/c plan  Dispo: The patient is from: SNF              Anticipated d/c is to: LTAC              Patient currently is not medically stable to d/c.   Difficult to place patient No       Consultants:  PCCM.  Neurology.   Siginificant Events 6/12 admitted from skilled nursing facility after aspiration event and copious secretions per tracheostomy 6/13 appears comfortable currently on 35% ATC. No distress. WBC ct trending down. No events overnight growing GPC clusters in two of two cultures  6/16 increased work of breathing, upper airway noise, abdominal muscle use.  Bronchoscopy performed as trach exchange cuffless #8 Shiley XLT showed dynamic airway collapse, tracheal collapse. 6/16 continued respiratory distress moved to ICU, changed to cuffed #6 Shiley XLT, placed on MV with some improved comfort 6/12 cefepime > 6/14, 6/18 6/12 vanc > 6/14, 6/18 6/23 mucus plugging,  severe vent dyssynchrony 6/24-25 intermittent jerking, seizures on EEG 6/27 weaned off propofol gtt 6/28 briefly tolerated trach collar in AM 6/29 tolerated trach collar in AM; no further seizure like activity; working with CM/SW for vent-SNF 7/1 Weaning on CPAP/ PS 10/5, CM SW working on [employer], NO seizure activity, secretions are not an issue per nursing VQ scan 6/15> Neg for acute PE CXR 6/16 > New L midlung atelectasis CXR 6/18>   Bibasilar atelectasis CT Head wo Contrast 6/24 > No definite acute intracranial abnormality, progressive cerebral atrophy w/chronic small vessel ischemic disease, acute L sphenoid sinusitis MRI Brain wo Contrast 6/28> No acute intracranial abnormality advanced atrophy  Antimicrobials:  Antibiotics Given (last 72 hours)     None         Subjective:  No new complaints at this time. Objective: Vitals:   10/25/20 1624 10/25/20 1700 10/25/20 1712 10/25/20 1800  BP:  (!) 83/70 (!) 128/108 107/71  Pulse: 88  89 93  Resp: (!) _0 Temp: 98.2 F (36.8 C)     TempSrc: Axillary     SpO2: 100%  100% 100%  Weight:      Height:        Intake/Output Summary (Last 24 hours) at 10/25/2020 1903 Last data filed at 10/25/2020 1800 Gross per 24 hour  Intake 1475 ml  Output 750 ml  Net 725 ml    Filed Weights   10/23/20 0321 10/24/20 0138 10/25/20 0500  Weight: 91.6 kg 89.2 kg 89.1 kg    Examination:  General exam: Ill-appearing lady s/p trach no distress noted Respiratory system: Bilateral air entry fair, s/p trach, vent dependent on FiO2 21% Cardiovascular system: S S1-S2 heard, regular rate rhythm, no JVD, no pedal edema Gastrointestinal system: Abdomen is soft nontender nondistended bowel sounds normal Central nervous system: Patient not following commands  extremities: No pedal edema Skin: stage 2 pressure injury on the buttocks.  Maculopapular rash is improving  Psychiatry: Cannot be assessed    Data Reviewed: I have personally reviewed following labs and imaging studies  CBC: Recent Labs  Lab 10/21/20 0118 10/23/20 1408 10/24/20 0042  WBC 10.8* 9.7 9.2  NEUTROABS  --  7.4 6.2  HGB 11.2* 9.5* 10.2*  HCT 35.9* 30.4* 31.9*  MCV 88.4 86.4 86.4  PLT 213 245 247     Basic Metabolic Panel: Recent Labs  Lab 10/21/20 0118 10/24/20 0042  NA 139 138  K 4.6 4.7  CL 106 105  CO2 25 25  GLUCOSE 113* 135*  BUN 12 13  CREATININE 0.45 0.43*  CALCIUM 10.1 10.3   MG 2.2  --      GFR: Estimated Creatinine Clearance: 71.7 mL/min (A) (by C-G formula based on SCr of 0.43 mg/dL (L)).  Liver Function Tests: No results for input(s): AST, ALT, ALKPHOS, BILITOT, PROT, ALBUMIN in the last 168 hours.  CBG: Recent Labs  Lab 10/24/20 2301 10/25/20 0301 10/25/20 0702 10/25/20 1056 10/25/20 1456  GLUCAP 159* 120* 99 99 105*      Recent Results (from the past 240 hour(s))  Culture, blood (Routine X 2) w Reflex to ID Panel     Status: Abnormal (Preliminary result)   Collection Time: 10/23/20 12:26 PM   Specimen: BLOOD LEFT HAND  Result Value Ref Range Status   Specimen Description BLOOD LEFT HAND  Final   Special Requests   Final    BOTTLES DRAWN AEROBIC AND ANAEROBIC Blood Culture adequate volume   Culture  Setup  Time (A)  Final    GRAM VARIABLE ROD ANAEROBIC BOTTLE ONLY CRITICAL RESULT CALLED TO, READ BACK BY AND VERIFIED WITH: PHARMD J Iran [id number] AT 1339 BY CM    Culture (A)  Final    BACILLUS SPECIES Standardized susceptibility testing for this organism is not available. Performed at [facility], [place] [address], [place], [place] [address]    Report Status PENDING  Incomplete  Blood Culture ID Panel (Reflexed)     Status: None   Collection Time: 10/23/20 12:26 PM  Result Value Ref Range Status   Enterococcus faecalis NOT DETECTED NOT DETECTED Final   Enterococcus Faecium NOT DETECTED NOT DETECTED Final   Listeria monocytogenes NOT DETECTED NOT DETECTED Final   Staphylococcus species NOT DETECTED NOT DETECTED Final   Staphylococcus aureus (BCID) NOT DETECTED NOT DETECTED Final   Staphylococcus epidermidis NOT DETECTED NOT DETECTED Final   Staphylococcus lugdunensis NOT DETECTED NOT DETECTED Final   Streptococcus species NOT DETECTED NOT DETECTED Final   Streptococcus agalactiae NOT DETECTED NOT DETECTED Final   Streptococcus pneumoniae NOT DETECTED NOT DETECTED Final   Streptococcus pyogenes NOT DETECTED NOT DETECTED Final    A.calcoaceticus-baumannii NOT DETECTED NOT DETECTED Final   Bacteroides fragilis NOT DETECTED NOT DETECTED Final   Enterobacterales NOT DETECTED NOT DETECTED Final   Enterobacter cloacae complex NOT DETECTED NOT DETECTED Final   Escherichia coli NOT DETECTED NOT DETECTED Final   Klebsiella aerogenes NOT DETECTED NOT DETECTED Final   Klebsiella oxytoca NOT DETECTED NOT DETECTED Final   Klebsiella pneumoniae NOT DETECTED NOT DETECTED Final   Proteus species NOT DETECTED NOT DETECTED Final   Salmonella species NOT DETECTED NOT DETECTED Final   Serratia marcescens NOT DETECTED NOT DETECTED Final   Haemophilus influenzae NOT DETECTED NOT DETECTED Final   Neisseria meningitidis NOT DETECTED NOT DETECTED Final   Pseudomonas aeruginosa NOT DETECTED NOT DETECTED Final   Stenotrophomonas maltophilia NOT DETECTED NOT DETECTED Final   Candida albicans NOT DETECTED NOT DETECTED Final   Candida auris NOT DETECTED NOT DETECTED Final   Candida glabrata NOT DETECTED NOT DETECTED Final   Candida krusei NOT DETECTED NOT DETECTED Final   Candida parapsilosis NOT DETECTED NOT DETECTED Final   Candida tropicalis NOT DETECTED NOT DETECTED Final   Cryptococcus neoformans/gattii NOT DETECTED NOT DETECTED Final    Comment: Performed at [facility], [address], [place], [place] [address]  Culture, blood (Routine X 2) w Reflex to ID Panel     Status: None (Preliminary result)   Collection Time: 10/23/20  2:04 PM   Specimen: BLOOD  Result Value Ref Range Status   Specimen Description BLOOD FOOT  Final   Special Requests   Final    BOTTLES DRAWN AEROBIC AND ANAEROBIC Blood Culture adequate volume   Culture   Final    NO GROWTH 2 DAYS Performed at [facility], [place] [address] [place], [place] [address]    Report Status PENDING  Incomplete         Radiology Studies: No results found.      Scheduled Meds:  arformoterol  15 mcg Nebulization BID   baclofen  5 mg Per Tube TID    budesonide  0.5 mg Nebulization BID   chlorhexidine gluconate (MEDLINE KIT)  15 mL Mouth Rinse BID   Chlorhexidine Gluconate Cloth  6 each Topical Daily   clonazePAM  0.5 mg Per Tube BID   clopidogrel  75 mg Per Tube Daily   enoxaparin (LOVENOX)  injection  0.5 mg/kg Subcutaneous Q24H   famotidine  20 mg Per Tube BID   feeding supplement (PROSource TF)  45 mL Per Tube BID   fiber  1 packet Per Tube BID   Gerhardt's butt cream   Topical BID   guaiFENesin  5 mL Per Tube Q6H   hydrocortisone cream   Topical TID   insulin aspart  0-15 Units Subcutaneous Q4H   insulin aspart  5 Units Subcutaneous Q4H   insulin glargine  40 Units Subcutaneous QHS   levETIRAcetam  2,500 mg Per Tube BID   loratadine  10 mg Per Tube Daily   mouth rinse  15 mL Mouth Rinse 10 times per day   metoprolol tartrate  12.5 mg Per Tube BID   montelukast  10 mg Per Tube Daily   nutrition supplement (JUVEN)  1 packet Per Tube BID   revefenacin  175 mcg Nebulization Daily   tiZANidine  2 mg Per Tube QHS   valproic acid  750 mg Per Tube Q12H   Continuous Infusions:  sodium chloride Stopped (10/17/20 1711)   sodium chloride     feeding supplement (JEVITY 1.5 CAL/FIBER) 1,000 mL (10/23/20 0500)     LOS: 24 days        Debbe Odea, MD [facility]   To contact the attending provider between 7A-7P or the covering provider during after hours 7P-7A, please log into the web site [email or web address] and access using universal [place] password for that web site. If you do not have the password, please call the hospital operator.  10/25/2020, 7:03 PM

## 2020-10-25 NOTE — Progress Notes (Signed)
NAME:  Sonia Drake, MRN:  [id number], DOB:  03/19/1956, LOS: 24 ADMISSION DATE:  10/05/2020, CONSULTATION DATE:  6/15 REFERRING MD:  Thedore Mins, CHIEF COMPLAINT:  Dyspnea   History of Present Illness:  65 y/o female with anoxic brain injury, tracheostomy status who was admitted from a SNF on 6/12 with worsening respiratory failure.  She had previously been ventilator dependent but had been weaned form the trach.  On 6/16 a bronchoscopy revealed dynamic collapse of her trachea, tracheostomy was replaced.  Later required mechanical ventilation.   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 No distress. WBC ct trending down. No events overnight growing GPC clusters in two of two cultures  6/16 increased work of breathing, upper airway noise, abdominal muscle use.  Bronchoscopy performed as trach exchange cuffless #8 Shiley XLT showed dynamic airway collapse, tracheal collapse. 6/16 continued respiratory distress moved to ICU, changed to cuffed #6 Shiley XLT, placed on MV with some improved comfort 6/12 cefepime > 6/14, 6/18 6/12 vanc > 6/14, 6/18  6/23 mucus plugging, severe vent dyssynchrony 6/24-25 intermittent jerking, seizures on EEG 6/27 weaned off propofol gtt  6/28 briefly tolerated trach collar in AM 6/29 tolerated trach collar in AM; no further seizure like activity; working with CM/SW for vent-SNF 7/1 Weaning on CPAP/ PS 10/5, CM SW working on [employer], NO seizure activity, secretions are not an issue per nursing 7/5 stable  overnight  Interim History / Subjective:  No overnight events  Objective   Blood pressure 122/82, pulse 100, temperature 98.2 F (36.8 C), temperature source Axillary, resp. rate (!) 22, height 5\' 2"  (1.575 m), weight 89.1 kg, SpO2 99 %.    Vent Mode: PRVC FiO2 (%):  [21 %] 21 % Set Rate:  [20 bmp] 20 bmp Vt Set:  [400 mL] 400 mL PEEP:  [5 cmH20] 5 cmH20 Plateau Pressure:  [14 cmH20-22 cmH20] 22 cmH20   Intake/Output Summary (Last 24 hours) at 10/25/2020 12/26/2020 Last data filed at 10/25/2020 0800 Gross per 24 hour  Intake 1271 ml  Output 800 ml  Net 471 ml   Filed Weights   10/23/20 0321 10/24/20 0138 10/25/20 0500  Weight: 91.6 kg 89.2 kg 89.1 kg    Examination: Constitutional: Chronically ill-appearing usual state: Eyes: Pupils equal and reacting  Minimal secretions via tracheostomy appreciated with normal Cardiovascular: S1-S2 appreciated, no murmur Respiratory: Rhonchi bilaterally Gastrointestinal: Soft, bowel sounds appreciated Skin: Skin is warm and dry Neurologic: Does not follow commands, brainstem reflexes present  Labs/imaging that I havepersonally reviewed  (right click and "Reselect all SmartList Selections" daily)  VQ scan 6/15> Neg for acute PE CXR 6/16 > New L midlung atelectasis CXR 6/18>  Bibasilar atelectasis  CT Head wo Contrast 6/24 > No definite acute intracranial abnormality, progressive cerebral atrophy w/chronic small vessel ischemic disease, acute L sphenoid sinusitis MRI Brain wo Contrast 6/28> No acute intracranial abnormality advanced atrophy   Resolved Hospital Problem list   Circulatory shock  Hemoptysis  Assessment & Plan:  Acute on chronic respiratory failure with hypoxemia requiring ventilator support Tracheostomy dependence Tracheobronchomalacia Remains encephalopathic -Trach collar as tolerated -Ventilator support at night -  Continue VAP bundle -Bronchodilators including Brovana, Pulmicort, Yupelri and albuterol  Anoxic  encephalopathy Postcardiac arrest On antiepileptics for status epilepticus -Keppra 2500 mg twice daily, Depakote 750 twice daily, Klonopin 1 mg twice daily -Pharmacy assisting with monitoring drug levels  Hypertension -Metoprolol 12.5 twice daily  Goals of care -Poor long-term prognosis -Skilled nursing facility  Virl Diamond, MD [facility] Pager: [phone]

## 2020-10-25 NOTE — Progress Notes (Signed)
Nutrition Follow-up  DOCUMENTATION CODES:   Not applicable  INTERVENTION:   Continue Jevity 1.5 at 45 ml/h with ProSource TF 45 ml BID via PEG to provide 1700 kcal, 91 gm protein, 821 ml free water daily.  Add Juven BID, via PEG, each packet provides 80 calories, 8 grams of carbohydrate, 2.5  grams of protein (collagen), 7 grams of L-arginine and 7 grams of L-glutamine; supplement contains CaHMB, Vitamins C, E, B12 and Zinc to promote wound healing.  NUTRITION DIAGNOSIS:   Increased nutrient needs related to wound healing as evidenced by estimated needs.  Ongoing   GOAL:   Patient will meet greater than or equal to 90% of their needs  Met with TF  MONITOR:   Vent status, Labs, Weight trends, TF tolerance, Skin, I & O's  REASON FOR ASSESSMENT:   Consult Enteral/tube feeding initiation and management  ASSESSMENT:   Sonia Drake is a 65 y.o. female with medical history significant of asthma/COPD, CHF, cardiac arrest with PEA/CPR with chronic respiratory failure on permanent trach with supplemental oxygen at 6L, type 2 DM, GERD, seizures and severe hypoxic-ischemic encephalopathy who presented to ER via EMS for respiratory distress from SNF.  Discussed patient in ICU rounds and with RN today. Patient is awaiting placement at a vent SNF facility. She is not weaning.  Receiving Jevity 1.5 at 45 ml/h with ProSource TF 45 ml BID via PEG. Tolerating well to meet nutrition needs. Will add Juven BID to aid in wound healing.  Patient remains intubated on ventilator support via trach. MV: 8.1 L/min Temp (24hrs), Avg:97.7 F (36.5 C), Min:96.8 F (36 C), Max:98.2 F (36.8 C)   Labs reviewed.  CBG: 120-99-99  Medications reviewed and include nutrisource fiber 1 packet BID, Novolog 5 units q 4 hours for TF coverage, Novolog SSI q 4 hours, Lantus.    NUTRITION - FOCUSED PHYSICAL EXAM:  Flowsheet Row Most Recent Value  Orbital Region No depletion  Upper Arm Region No  depletion  Thoracic and Lumbar Region No depletion  Buccal Region No depletion  Temple Region No depletion  Clavicle Bone Region No depletion  Clavicle and Acromion Bone Region No depletion  Scapular Bone Region Unable to assess  Dorsal Hand No depletion  Patellar Region No depletion  Anterior Thigh Region No depletion  Posterior Calf Region Mild depletion  Edema (RD Assessment) Moderate  Hair Reviewed  Eyes Unable to assess  Mouth Unable to assess  Skin Reviewed  Nails Reviewed       Diet Order:   Diet Order             Diet NPO time specified  Diet effective now                   EDUCATION NEEDS:   No education needs have been identified at this time  Skin:  Skin Assessment: Skin Integrity Issues: Skin Integrity Issues:: Stage II Stage II: buttocks  Last BM:  7/5 type 6  Height:   Ht Readings from Last 1 Encounters:  10/12/20 _0  (1.575 m)    Weight:   Wt Readings from Last 1 Encounters:  10/25/20 89.1 kg    Ideal Body Weight:  45.5 kg  BMI:  Body mass index is 35.93 kg/m.  Estimated Nutritional Needs:   Kcal:  1600-1800  Protein:  90-105 grams  Fluid:  > 1.6 L    Lucas Mallow, RD, LDN, CNSC Please refer to Amion for contact information.

## 2020-10-26 DIAGNOSIS — I5032 Chronic diastolic (congestive) heart failure: Secondary | ICD-10-CM | POA: Diagnosis not present

## 2020-10-26 DIAGNOSIS — A419 Sepsis, unspecified organism: Secondary | ICD-10-CM | POA: Diagnosis not present

## 2020-10-26 DIAGNOSIS — J9601 Acute respiratory failure with hypoxia: Secondary | ICD-10-CM | POA: Diagnosis not present

## 2020-10-26 DIAGNOSIS — R569 Unspecified convulsions: Secondary | ICD-10-CM | POA: Diagnosis not present

## 2020-10-26 DIAGNOSIS — J449 Chronic obstructive pulmonary disease, unspecified: Secondary | ICD-10-CM | POA: Diagnosis not present

## 2020-10-26 DIAGNOSIS — J9621 Acute and chronic respiratory failure with hypoxia: Secondary | ICD-10-CM | POA: Diagnosis not present

## 2020-10-26 LAB — POCT I-STAT 7, (LYTES, BLD GAS, ICA,H+H)
Acid-Base Excess: 5 mmol/L — ABNORMAL HIGH (ref 0.0–2.0)
Bicarbonate: 30 mmol/L — ABNORMAL HIGH (ref 20.0–28.0)
Calcium, Ion: 1.47 mmol/L — ABNORMAL HIGH (ref 1.15–1.40)
HCT: 29 % — ABNORMAL LOW (ref 36.0–46.0)
Hemoglobin: 9.9 g/dL — ABNORMAL LOW (ref 12.0–15.0)
O2 Saturation: 95 %
Patient temperature: 98.4
Potassium: 4 mmol/L (ref 3.5–5.1)
Sodium: 141 mmol/L (ref 135–145)
TCO2: 31 mmol/L (ref 22–32)
pCO2 arterial: 43.9 mmHg (ref 32.0–48.0)
pH, Arterial: 7.442 (ref 7.350–7.450)
pO2, Arterial: 73 mmHg — ABNORMAL LOW (ref 83.0–108.0)

## 2020-10-26 LAB — GLUCOSE, CAPILLARY
Glucose-Capillary: 101 mg/dL — ABNORMAL HIGH (ref 70–99)
Glucose-Capillary: 124 mg/dL — ABNORMAL HIGH (ref 70–99)
Glucose-Capillary: 119 mg/dL — ABNORMAL HIGH (ref 70–99)
Glucose-Capillary: 94 mg/dL (ref 70–99)
Glucose-Capillary: 110 mg/dL — ABNORMAL HIGH (ref 70–99)
Glucose-Capillary: 126 mg/dL — ABNORMAL HIGH (ref 70–99)

## 2020-10-26 LAB — CULTURE, BLOOD (ROUTINE X 2): Special Requests: ADEQUATE

## 2020-10-26 NOTE — Progress Notes (Signed)
[facility] PROGRESS NOTE    LINLEY MOSKAL  ZOX:[id number] DOB: 1956/03/08 DOA: 10/15/2020 PCP: Garwin Brothers, MD      Brief Narrative:  Sonia Drake is a 65 y.o. F with DM, COPD, seizures, and anoxic brain injury due to PEA cardiac arrest Aug 2021 at [facility] now in with persistent vegetative state (opens eyes but does not follow commands), tracheostomy dependent who presented with respiratory distress from SNF.      Siginificant Events 6/12 admitted from skilled nursing facility after reported aspiration event, copious secretions, and respiratory distress; working diagnosis was sepsis from CXR-negative aspiration pneumonia and abx were started 6/13 appeared comfortable on 35% ATC. No distress. WBC ct trending down.  Blood cultures growing 2 different CoNS, likely contaminant 6/14 abx stopped, no further fever 6/16 increased work of breathing, upper airway noise, abdominal muscle use.  Bronchoscopy performed as trach exchange cuffless #8 Shiley XLT showed dynamic airway collapse, tracheal collapse. VQ scan 6/15> Neg for acute PE 6/16 continued respiratory distress moved to ICU, changed to cuffed #6 Shiley XLT, placed on MV with some improved comfort Vanc restarted 6/18 to 6/19, then stopped, no further fever, WBC normalized off abx.    6/23 mucus plugging, severe vent dyssynchrony 6/24-25 intermittent jerking, seizures on EEG propofol restarted 6/27 weaned off propofol gtt 6/28 briefly tolerated trach collar in AM  6/29 tolerated trach collar in AM; no further seizure like activity; working with CM/SW for vent-SNF 7/1 Weaning on CPAP/ PS 10/5, CM SW working on [employer], NO seizure activity, secretions are not an issue per nursing 7/1 - 7/5 has remained stable        Assessment & Plan:  Chronic respiratory failure due to anoxic brain injury, dynamic airway collapse (due to tracheobronchomalacia) and advanced COPD Over the last 2 weeks, the patient has tolerated  weaning the tracheostomy collar during the day, vent support at night.  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    Epileptologist have signed off.No seizures in the last 24 hours.  - Continue clonazepam 0.5 twice daily -Continue Keppra and Depakote  Type 2 diabetes GLucoses well controlled -continue Lantus - Continue sliding scale corrections  Hypertension Blood pressure controlled - Continue metoprolol  COPD FEV1 2019 was 38% -Continue Brovana, Pulmicort, LAMA -Continue Singulair, Claritin, famotidine  Cerebrovascular disease, secondary prevention - Continue Plavix  Obesity BMI 36  Resolved issues: Positive blood cultures Blood cultures on admission growing actually 3 different species of coag negative staph, appear to all be contaminants.   Hypokalemia Resolved        Disposition: Status is: Inpatient  Remains inpatient appropriate because:Unsafe d/c plan patient was transferred from SNF because of respiratory distress, here has not been weaned off of ventilator.  As we attempt to wean, we will continue seeking safe disposition.  Dispo: The patient is from: SNF              Anticipated d/c is to: LTAC    Difficult to place patient Yes       Level of care: ICU       MDM: The below labs and imaging reports  were reviewed and summarized above.  Medication management as above.    DVT prophylaxis:   not Applicable, patient stable for transition to rehab setting  Code Status: Full code Family Communication:         Procedures: VQ scan 6/15> Neg for acute PE CXR 6/16 > New L midlung atelectasis CXR 6/18>  Bibasilar atelectasis CT Head wo Contrast 6/24 > No definite acute intracranial abnormality, progressive cerebral atrophy w/chronic small vessel ischemic disease, acute L sphenoid sinusitis MRI Brain wo Contrast 6/28> No acute intracranial abnormality advanced atrophy     Subjective: Increased secretions and a.  No fever.  No respiratory distress, vomiting, apparent pain.  Objective: Vitals:   10/26/20 0335 10/26/20 0400 10/26/20 0500 10/26/20 0600  BP:  115/80 91/64 (!) 80/59  Pulse:  93 96 82  Resp:  (!) _0 Temp: 98.4 F (36.9 C)     TempSrc: Oral     SpO2:  98% 95% 96%  Weight:  89.3 kg    Height:        Intake/Output Summary (Last 24 hours) at 10/26/2020 0704 Last data filed at 10/26/2020 0600 Gross per 24 hour  Intake 1815 ml  Output 400 ml  Net 1415 ml   Filed Weights   10/24/20 0138 10/25/20 0500 10/26/20 0400  Weight: 89.2 kg 89.1 kg 89.3 kg    Examination: General appearance:  adult female, comatose.   HEENT:     Skin: Moist, few pustules on chest, old abdominal scar.  no jaundice.  No suspicious rashes or lesions. Cardiac: RRR, nl S1-S2, no murmurs appreciated.  Capillary refill is brisk.  JVP not visible.  Mild peripheral LE edema.  Radial  pulses 2+ and symmetric. Respiratory: Increased respiratory rate, coarse upper airway rhonchi bilaterally, no wheezing or rales Abdomen: Abdomen soft.  No grimace to palpation, no ascites, distension, hepatosplenomegaly.   MSK:  Neuro: Eyes open spontaneously, but does not make eye contact, does not follow commands, does not respond to voice.  Seems contractured in all 4 extremities Psych: Does not respond intelligibly stimuli   Data Reviewed: I have personally reviewed following labs and imaging studies:  CBC: Recent Labs  Lab 10/21/20 0118 10/23/20 1408 10/24/20 0042 10/26/20 0431  WBC 10.8* 9.7 9.2  --    NEUTROABS  --  7.4 6.2  --   HGB 11.2* 9.5* 10.2* 9.9*  HCT 35.9* 30.4* 31.9* 29.0*  MCV 88.4 86.4 86.4  --   PLT 213 245 247  --    Basic Metabolic Panel: Recent Labs  Lab 10/21/20 0118 10/24/20 0042 10/26/20 0431  NA 139 138 141  K 4.6 4.7 4.0  CL 106 105  --   CO2 25 25  --   GLUCOSE 113* 135*  --   BUN 12 13  --   CREATININE 0.45 0.43*  --   CALCIUM 10.1 10.3  --   MG 2.2  --   --    GFR: Estimated Creatinine Clearance: 71.9 mL/min (A) (by C-G formula based on SCr of 0.43 mg/dL (L)). Liver Function Tests: No results for input(s): AST, ALT, ALKPHOS, BILITOT, PROT, ALBUMIN in the last 168 hours. No results for input(s): LIPASE, AMYLASE in the last 168 hours. No results for input(s): AMMONIA in the last 168 hours. Coagulation Profile: No results for input(s): INR, PROTIME in the last 168 hours. Cardiac Enzymes: No results for input(s): CKTOTAL, CKMB, CKMBINDEX, TROPONINI in the last 168 hours. BNP (last 3 results) No results  for input(s): PROBNP in the last 8760 hours. HbA1C: No results for input(s): HGBA1C in the last 72 hours. CBG: Recent Labs  Lab 10/25/20 1056 10/25/20 1456 10/25/20 1904 10/25/20 2305 10/26/20 0302  GLUCAP 99 105* 120* 128* 101*   Lipid Profile: No results for input(s): CHOL, HDL, LDLCALC, TRIG, CHOLHDL, LDLDIRECT in the last 72 hours. Thyroid Function Tests: No results for input(s): TSH, T4TOTAL, FREET4, T3FREE, THYROIDAB in the last 72 hours. Anemia Panel: No results for input(s): VITAMINB12, FOLATE, FERRITIN, TIBC, IRON, RETICCTPCT in the last 72 hours. Urine analysis:    Component Value Date/Time   COLORURINE YELLOW 10/22/2020 1659   APPEARANCEUR HAZY (A) 10/22/2020 1659   LABSPEC 1.018 10/22/2020 1659   PHURINE 8.0 10/22/2020 1659   GLUCOSEU NEGATIVE 10/22/2020 1659   HGBUR NEGATIVE 10/22/2020 1659   BILIRUBINUR NEGATIVE 10/22/2020 1659   KETONESUR 5 (A) 10/22/2020 1659   PROTEINUR NEGATIVE 10/22/2020 1659   UROBILINOGEN 0.2  07/03/2014 1456   NITRITE NEGATIVE 10/22/2020 1659   LEUKOCYTESUR NEGATIVE 10/22/2020 1659   Sepsis Labs: _0 (procalcitonin:4,lacticacidven:4)  ) Recent Results (from the past 240 hour(s))  Culture, blood (Routine X 2) w Reflex to ID Panel     Status: Abnormal (Preliminary result)   Collection Time: 10/23/20 12:26 PM   Specimen: BLOOD LEFT HAND  Result Value Ref Range Status   Specimen Description BLOOD LEFT HAND  Final   Special Requests   Final    BOTTLES DRAWN AEROBIC AND ANAEROBIC Blood Culture adequate volume   Culture  Setup Time (A)  Final    GRAM VARIABLE ROD ANAEROBIC BOTTLE ONLY CRITICAL RESULT CALLED TO, READ BACK BY AND VERIFIED WITH: PHARMD J Iran [id number] AT 1339 BY CM    Culture (A)  Final    BACILLUS SPECIES Standardized susceptibility testing for this organism is not available. Performed at [facility], [place] [address] [place], [place] [address]    Report Status PENDING  Incomplete  Blood Culture ID Panel (Reflexed)     Status: None   Collection Time: 10/23/20 12:26 PM  Result Value Ref Range Status   Enterococcus faecalis NOT DETECTED NOT DETECTED Final   Enterococcus Faecium NOT DETECTED NOT DETECTED Final   Listeria monocytogenes NOT DETECTED NOT DETECTED Final   Staphylococcus species NOT DETECTED NOT DETECTED Final   Staphylococcus aureus (BCID) NOT DETECTED NOT DETECTED Final   Staphylococcus epidermidis NOT DETECTED NOT DETECTED Final   Staphylococcus lugdunensis NOT DETECTED NOT DETECTED Final   Streptococcus species NOT DETECTED NOT DETECTED Final   Streptococcus agalactiae NOT DETECTED NOT DETECTED Final   Streptococcus pneumoniae NOT DETECTED NOT DETECTED Final   Streptococcus pyogenes NOT DETECTED NOT DETECTED Final   A.calcoaceticus-baumannii NOT DETECTED NOT DETECTED Final   Bacteroides fragilis NOT DETECTED NOT DETECTED Final   Enterobacterales NOT DETECTED NOT DETECTED Final   Enterobacter cloacae complex NOT DETECTED NOT  DETECTED Final   Escherichia coli NOT DETECTED NOT DETECTED Final   Klebsiella aerogenes NOT DETECTED NOT DETECTED Final   Klebsiella oxytoca NOT DETECTED NOT DETECTED Final   Klebsiella pneumoniae NOT DETECTED NOT DETECTED Final   Proteus species NOT DETECTED NOT DETECTED Final   Salmonella species NOT DETECTED NOT DETECTED Final   Serratia marcescens NOT DETECTED NOT DETECTED Final   Haemophilus influenzae NOT DETECTED NOT DETECTED Final   Neisseria meningitidis NOT DETECTED NOT DETECTED Final   Pseudomonas aeruginosa NOT DETECTED NOT DETECTED Final   Stenotrophomonas maltophilia NOT DETECTED NOT DETECTED Final   Candida albicans NOT  DETECTED NOT DETECTED Final   Candida auris NOT DETECTED NOT DETECTED Final   Candida glabrata NOT DETECTED NOT DETECTED Final   Candida krusei NOT DETECTED NOT DETECTED Final   Candida parapsilosis NOT DETECTED NOT DETECTED Final   Candida tropicalis NOT DETECTED NOT DETECTED Final   Cryptococcus neoformans/gattii NOT DETECTED NOT DETECTED Final    Comment: Performed at [facility], [place] [address] [place], [place] [address]  Culture, blood (Routine X 2) w Reflex to ID Panel     Status: None (Preliminary result)   Collection Time: 10/23/20  2:04 PM   Specimen: BLOOD  Result Value Ref Range Status   Specimen Description BLOOD FOOT  Final   Special Requests   Final    BOTTLES DRAWN AEROBIC AND ANAEROBIC Blood Culture adequate volume   Culture   Final    NO GROWTH 2 DAYS Performed at [facility], [place] [address], [place], [place] [address]    Report Status PENDING  Incomplete         Radiology Studies: No results found.      Scheduled Meds:  arformoterol  15 mcg Nebulization BID   baclofen  5 mg Per Tube TID   budesonide  0.5 mg Nebulization BID   chlorhexidine gluconate (MEDLINE KIT)  15 mL Mouth Rinse BID   Chlorhexidine Gluconate Cloth  6 each Topical Daily   clonazePAM  0.5 mg Per Tube BID   clopidogrel  75 mg Per Tube  Daily   enoxaparin (LOVENOX) injection  0.5 mg/kg Subcutaneous Q24H   famotidine  20 mg Per Tube BID   feeding supplement (PROSource TF)  45 mL Per Tube BID   fiber  1 packet Per Tube BID   Gerhardt's butt cream   Topical BID   guaiFENesin  5 mL Per Tube Q6H   hydrocortisone cream   Topical TID   insulin aspart  0-15 Units Subcutaneous Q4H   insulin aspart  5 Units Subcutaneous Q4H   insulin glargine  40 Units Subcutaneous QHS   levETIRAcetam  2,500 mg Per Tube BID   loratadine  10 mg Per Tube Daily   mouth rinse  15 mL Mouth Rinse 10 times per day   metoprolol tartrate  12.5 mg Per Tube BID   montelukast  10 mg Per Tube Daily   nutrition supplement (JUVEN)  1 packet Per Tube BID   revefenacin  175 mcg Nebulization Daily   tiZANidine  2 mg Per Tube QHS   valproic acid  750 mg Per Tube Q12H   Continuous Infusions:  sodium chloride Stopped (10/17/20 1711)   sodium chloride     feeding supplement (JEVITY 1.5 CAL/FIBER) 1,000 mL (10/26/20 0315)     LOS: 25 days    Time spent: 25 minutes    [name], MD [facility] 10/26/2020, 7:04 AM     Please page though [place] or Epic secure chat:  For [employer], Adult nurse

## 2020-10-26 NOTE — Progress Notes (Signed)
RT NOTE: Pt placed on trach collar 5 liters 28% per MD order. Pt is tolerating well. RT will continue to monitor.

## 2020-10-26 NOTE — Progress Notes (Signed)
RT NOTE: Pt placed back on VC on the vent due to work of breathing, secretions, and agitation on trach collar. RT will continue to monitor.

## 2020-10-26 NOTE — TOC Progression Note (Addendum)
Transition of Care [facility]) - Progression Note    Patient Details  Name: Sonia Drake MRN: [id number] Date of Birth: 02/02/1956  Transition [facility] CM/SW Contact  Erin Sons, [place] Phone Number: 10/26/2020, 12:07 PM  Clinical Narrative:     CSW called [facility] liaison. They need pt tried on volume control assist settings for full 24 hours. They do have a bed available for pt.   CSW spoke with daughter who explained she needed SNF to be able to wean pt.   CSW clarified with MD that pt's baseline likely will be trach collar during the day and vent at night which she is tolerating well now.   Spoke with [facility] who explained they don't take pt's who are trach collar duriing the day and vent at night as they are unable to monitor pt's close enough if they decompensate while off of vent. The only scenario that they take pts on trach collar during the day is if the pt is DNR.  Per chart review, [facility] did not have beds as of 10/18/20 and [facility] was noted to be out of network with pt's insurance.   CSW updated pt's daughter on options and explained limited availability of SNF's. Explained that we could expand vent snf search to [place]. She was very much against this. She insisted that [facility] does take [employer] as pt was there in 2021 and states her insurance has not changed since then. Daughter also expresses being against pt going to [place] as it is too far away. CSW reiterated that these are the only vent SNF's in [place].   CSW called and left message with Prim at [facility] requesting call back to clarify pt's insurance.  1413: CSW spoke with Prim; he confirmed that [facility] is not in network with pt's [employer] medicare and cannot take that insurance. He explains that pt likely was at [facility] in the past which does take [facility] medicare. CSW inquired about pt's medicaid; [facility] sometimes takes medicaid but only if they are on vent and if pt has "vent medicaid."  He explains requirement for a pt to qualify for "vent medicaid" is being on a vent for at least 10 hours per day. He explains that [facility] is still unable to accept pt because they are at capacity for vent patients at this time.  Expected Discharge Plan: Skilled Nursing Facility Barriers to Discharge: No SNF bed  Expected Discharge Plan and Services Expected Discharge Plan: Skilled Nursing Facility   Discharge Planning Services: CM Consult                                           Social Determinants of Health (SDOH) Interventions    Readmission Risk Interventions No flowsheet data found.

## 2020-10-26 NOTE — Progress Notes (Signed)
NAME:  CARYL FATE, MRN:  [id number], DOB:  Nov 06, 1955, LOS: 25 ADMISSION DATE:  10/22/2020, CONSULTATION DATE:  6/15 REFERRING MD:  Thedore Mins, CHIEF COMPLAINT:  Dyspnea   History of Present Illness:  65 y/o female with anoxic brain injury, tracheostomy status who was admitted from a SNF on 6/12 with worsening respiratory failure.  She had previously been ventilator dependent but had been weaned form the trach.  On 6/16 a bronchoscopy revealed dynamic collapse of her trachea, tracheostomy was replaced.  Later required mechanical ventilation.   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 No distress. WBC ct trending down. No events overnight growing GPC clusters in two of two cultures  6/16 increased work of breathing, upper airway noise, abdominal muscle use.  Bronchoscopy performed as trach exchange cuffless #8 Shiley XLT showed dynamic airway collapse, tracheal collapse. 6/16 continued respiratory distress moved to ICU, changed to cuffed #6 Shiley XLT, placed on MV with some improved comfort 6/12 cefepime > 6/14, 6/18 6/12 vanc > 6/14, 6/18  6/23 mucus plugging, severe vent dyssynchrony 6/24-25 intermittent jerking, seizures on EEG 6/27 weaned off propofol gtt  6/28 briefly tolerated trach collar in AM 6/29 tolerated trach collar in AM; no further seizure like activity; working with CM/SW for vent-SNF 7/1 Weaning on CPAP/ PS 10/5, CM SW working on [employer], NO seizure activity, secretions are not an issue per nursing 7/5 stable  overnight Tolerating volume control ventilation  Interim History / Subjective:  No overnight events  Objective   Blood pressure 119/89, pulse 98, temperature 98.4 F (36.9 C), temperature source Oral, resp. rate 13, height 5\' 2"  (1.575 m), weight 89.3 kg, SpO2 96 %.    Vent Mode: Other (Comment) FiO2 (%):  [21 %] 21 % Set Rate:  [20 bmp] 20 bmp Vt Set:  [400 mL] 400 mL PEEP:  [5 cmH20] 5 cmH20 Plateau Pressure:  [16 cmH20-22 cmH20] 18 cmH20   Intake/Output Summary (Last 24 hours) at 10/26/2020 0826 Last data filed at 10/26/2020 0700 Gross per 24 hour  Intake 1815 ml  Output 400 ml  Net 1415 ml   Filed Weights   10/24/20 0138 10/25/20 0500 10/26/20 0400  Weight: 89.2 kg 89.1 kg 89.3 kg    Examination: Constitutional: Chronically ill-appearing Eyes: Pupils equal and reacting  Cardiovascular: S1-S2 appreciated Respiratory: Bilateral rhonchi Gastrointestinal: Soft, bowel sounds appreciated Skin: Skin is warm and dry Neurologic: Not following commands-Baseline  Labs/imaging that I havepersonally reviewed  (right click and "Reselect all SmartList Selections" daily)  VQ scan 6/15> Neg for acute PE CXR 6/16 > New L midlung atelectasis CXR 6/18>  Bibasilar atelectasis  CT Head wo Contrast 6/24 > No definite acute intracranial abnormality, progressive cerebral atrophy w/chronic small vessel ischemic disease, acute L sphenoid sinusitis MRI Brain wo Contrast 6/28> No acute intracranial abnormality advanced atrophy   Resolved Hospital Problem list   Circulatory shock  Hemoptysis  Assessment & Plan:  Acute on chronic respiratory failure with hypoxemia requiring ventilator support Tracheostomy dependence Tracheobronchomalacia Patient remains encephalopathic -Patient tolerating volume-controlled ventilation -Ventilator support at night -Continue VAP bundle -Continue bronchodilators including Brovana, Pulmicort, Yupelri, albuterol  Anoxic encephalopathy Post cardiac arrest On  antiepileptics for status epilepticus -Keppra, Depakote, Klonopin -Drug levels being monitored  Hypertension -Metoprolol 12.5 twice daily  Goals of care -Prolonged comfort versus -Skilled nursing facility   Patient tolerated volume control ventilation ABG noted-within normal limits  Respiratory status appears stable enough for patient to be transition to skilled nursing facility as soon as bed is available  Virl Diamond, MD [place] [facility] Pager: [phone]

## 2020-10-27 ENCOUNTER — Inpatient Hospital Stay (HOSPITAL_COMMUNITY): Payer: Medicare Other

## 2020-10-27 DIAGNOSIS — J9621 Acute and chronic respiratory failure with hypoxia: Secondary | ICD-10-CM | POA: Diagnosis not present

## 2020-10-27 DIAGNOSIS — R569 Unspecified convulsions: Secondary | ICD-10-CM | POA: Diagnosis not present

## 2020-10-27 DIAGNOSIS — J449 Chronic obstructive pulmonary disease, unspecified: Secondary | ICD-10-CM | POA: Diagnosis not present

## 2020-10-27 DIAGNOSIS — A419 Sepsis, unspecified organism: Secondary | ICD-10-CM | POA: Diagnosis not present

## 2020-10-27 LAB — POCT I-STAT 7, (LYTES, BLD GAS, ICA,H+H)
Acid-Base Excess: 5 mmol/L — ABNORMAL HIGH (ref 0.0–2.0)
Bicarbonate: 30.5 mmol/L — ABNORMAL HIGH (ref 20.0–28.0)
Calcium, Ion: 1.51 mmol/L (ref 1.15–1.40)
HCT: 31 % — ABNORMAL LOW (ref 36.0–46.0)
Hemoglobin: 10.5 g/dL — ABNORMAL LOW (ref 12.0–15.0)
O2 Saturation: 99 %
Patient temperature: 98.2
Potassium: 4.2 mmol/L (ref 3.5–5.1)
Sodium: 141 mmol/L (ref 135–145)
TCO2: 32 mmol/L (ref 22–32)
pCO2 arterial: 50.4 mmHg — ABNORMAL HIGH (ref 32.0–48.0)
pH, Arterial: 7.388 (ref 7.350–7.450)
pO2, Arterial: 127 mmHg — ABNORMAL HIGH (ref 83.0–108.0)

## 2020-10-27 LAB — GLUCOSE, CAPILLARY
Glucose-Capillary: 66 mg/dL — ABNORMAL LOW (ref 70–99)
Glucose-Capillary: 101 mg/dL — ABNORMAL HIGH (ref 70–99)
Glucose-Capillary: 130 mg/dL — ABNORMAL HIGH (ref 70–99)
Glucose-Capillary: 97 mg/dL (ref 70–99)
Glucose-Capillary: 103 mg/dL — ABNORMAL HIGH (ref 70–99)
Glucose-Capillary: 128 mg/dL — ABNORMAL HIGH (ref 70–99)
Glucose-Capillary: 108 mg/dL — ABNORMAL HIGH (ref 70–99)

## 2020-10-27 MED ORDER — LORAZEPAM 2 MG/ML IJ SOLN
INTRAMUSCULAR | Status: AC
  Administered 2020-10-27: 2 mg via INTRAVENOUS
  Filled 2020-10-27: qty 1

## 2020-10-27 MED ORDER — DEXTROSE 50 % IV SOLN
INTRAVENOUS | Status: AC
  Administered 2020-10-27: 12.5 g via INTRAVENOUS
  Filled 2020-10-27: qty 50

## 2020-10-27 MED ORDER — DEXTROSE 50 % IV SOLN: 12.5000 g | INTRAVENOUS | Status: AC

## 2020-10-27 MED ORDER — LORAZEPAM 2 MG/ML IJ SOLN
2.0000 mg | INTRAMUSCULAR | Status: DC | PRN
Start: 2020-10-27 — End: 2021-01-27
  Administered 2020-10-28 – 2020-12-21 (×30): 2 mg via INTRAVENOUS
  Administered 2020-12-21: 4 mg via INTRAVENOUS
  Administered 2020-12-22 – 2020-12-23 (×5): 2 mg via INTRAVENOUS
  Administered 2020-12-24 – 2020-12-25 (×2): 4 mg via INTRAVENOUS
  Administered 2020-12-25 – 2021-01-06 (×11): 2 mg via INTRAVENOUS
  Administered 2021-01-07 (×2): 4 mg via INTRAVENOUS
  Administered 2021-01-09 – 2021-01-24 (×11): 2 mg via INTRAVENOUS
  Administered 2021-01-24: 4 mg via INTRAVENOUS
  Filled 2020-10-27 (×6): qty 1
  Filled 2020-10-27: qty 2
  Filled 2020-10-27 (×3): qty 1
  Filled 2020-10-27: qty 2
  Filled 2020-10-27 (×30): qty 1
  Filled 2020-10-27: qty 2
  Filled 2020-10-27 (×4): qty 1
  Filled 2020-10-27: qty 2
  Filled 2020-10-27 (×5): qty 1
  Filled 2020-10-27: qty 2
  Filled 2020-10-27 (×13): qty 1

## 2020-10-27 NOTE — Progress Notes (Signed)
NAME:  Sonia Drake, MRN:  [id number], DOB:  1956/03/01, LOS: 26 ADMISSION DATE:  10/05/2020, CONSULTATION DATE:  6/15 REFERRING MD:  Thedore Mins, CHIEF COMPLAINT:  Dyspnea   History of Present Illness:  65 y/o female with anoxic brain injury, tracheostomy status who was admitted from a SNF on 6/12 with worsening respiratory failure.  She had previously been ventilator dependent but had been weaned form the trach.  On 6/16 a bronchoscopy revealed dynamic collapse of her trachea, tracheostomy was replaced.  Later required mechanical ventilation.   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 No distress. WBC ct trending down. No events overnight growing GPC clusters in two of two cultures  6/16 increased work of breathing, upper airway noise, abdominal muscle use.  Bronchoscopy performed as trach exchange cuffless #8 Shiley XLT showed dynamic airway collapse, tracheal collapse. 6/16 continued respiratory distress moved to ICU, changed to cuffed #6 Shiley XLT, placed on MV with some improved comfort 6/12 cefepime > 6/14, 6/18 6/12 vanc > 6/14, 6/18  6/23 mucus plugging, severe vent dyssynchrony 6/24-25 intermittent jerking, seizures on EEG 6/27 weaned off propofol gtt  6/28 briefly tolerated trach collar in AM 6/29 tolerated trach collar in AM; no further seizure like activity; working with CM/SW for vent-SNF 7/1 Weaning on CPAP/ PS 10/5, CM SW working on [employer], NO seizure activity, secretions are not an issue per nursing   Interim History  / Subjective:  Air trapping overnight.  Did better with pressure support.  Objective   Blood pressure 115/84, pulse 100, temperature 98.2 F (36.8 C), temperature source Axillary, resp. rate 18, height 5\' 2"  (1.575 m), weight 90.3 kg, SpO2 94 %.    Vent Mode: PSV;CPAP FiO2 (%):  [3 %-28 %] 21 % Set Rate:  [20 bmp] 20 bmp Vt Set:  [400 mL] 400 mL PEEP:  [5 cmH20] 5 cmH20 Pressure Support:  [12 cmH20] 12 cmH20 Plateau Pressure:  [13 cmH20-16 cmH20] 16 cmH20   Intake/Output Summary (Last 24 hours) at 10/27/2020 0908 Last data filed at 10/27/2020 0800 Gross per 24 hour  Intake 1360 ml  Output 550 ml  Net 810 ml   Filed Weights   10/25/20 0500 10/26/20 0400 10/27/20 0500  Weight: 89.1 kg 89.3 kg 90.3 kg    Examination:  General - not on IV sedation  Eyes - pupils reactive ENT - trach site clean Cardiac - regular rate/rhythm, no murmur Chest - equal breath sounds b/l, no wheezing or rales Abdomen - soft, non tender, + bowel sounds Extremities - no cyanosis, clubbing, or edema Skin - no rashes Neuro - blinks with stimulation, not following commands   Studies:  VQ scan 6/15> Neg for acute PE CT Head wo Contrast 6/24 > No definite acute intracranial abnormality, progressive cerebral atrophy w/chronic small vessel ischemic disease, acute L sphenoid sinusitis MRI Brain wo Contrast 6/28>  advanced atrophy, old Lt occipital infarct  Resolved Hospital Problem list   Circulatory shock, Hemoptysis  Assessment & Plan:   Acute on chronic hypoxic/hypercapnic respiratory failure in setting of anoxic encephalopathy, tracheobronchomalacia, and severe COPD. 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 Seizure disorder. Hypertension. DM type 2. Hx of CVA. - per primary team  PCCM  will follow up in Monday 10/30/20 - call if help needed sooner.  Labs:   CMP Latest Ref Rng & Units 10/27/2020 10/26/2020 10/24/2020  Glucose 70 - 99 mg/dL - - 357(S)  BUN 8 - 23 mg/dL - - 13  Creatinine 1.77 - 1.00 mg/dL - - 9.39(Q)  Sodium 300 - 145 mmol/L 141 141 138  Potassium 3.5 - 5.1 mmol/L 4.2 4.0 4.7  Chloride 98 - 111 mmol/L - - 105  CO2 22 - 32 mmol/L - - 25  Calcium 8.9 - 10.3 mg/dL - - 92.3  Total Protein 6.5 - 8.1 g/dL - - -  Total Bilirubin 0.3 - 1.2 mg/dL - - -  Alkaline Phos 38 - 126 U/L - - -  AST 15 - 41 U/L - - -  ALT 0 - 44 U/L - - -    CBC Latest Ref Rng & Units 10/27/2020 10/26/2020 10/24/2020  WBC 4.0 - 10.5 K/uL - - 9.2  Hemoglobin 12.0 - 15.0 g/dL 10.5(L) 9.9(L) 10.2(L)  Hematocrit 36.0 - 46.0 % 31.0(L) 29.0(L) 31.9(L)  Platelets 150 - 400 K/uL - - 247    ABG    Component Value Date/Time   PHART 7.388 10/27/2020 0356   PCO2ART 50.4 (H) 10/27/2020 0356   PO2ART 127 (H) 10/27/2020 0356   HCO3 30.5 (H) 10/27/2020 0356   TCO2 32 10/27/2020 0356   O2SAT 99.0 10/27/2020 0356    CBG (last 3)  Recent Labs    10/27/20 0320 10/27/20 0347 10/27/20 0715  GLUCAP 66* 101* 130*    Signature:  Coralyn Helling, MD [facility] Pulmonary/Critical Care Pager - [phone] 10/27/2020, 9:16 AM

## 2020-10-27 NOTE — Progress Notes (Signed)
[facility] PROGRESS NOTE    Sonia Drake  YBO:[id number] DOB: 10/15/1955 DOA: 09/22/2020 PCP: Garwin Brothers, MD      Brief Narrative:  Sonia Drake is a 65 y.o. F with DM, COPD, seizures, and anoxic brain injury due to PEA cardiac arrest Aug 2021 at [facility] now in with persistent vegetative state (opens eyes but does not follow commands), tracheostomy dependent who presented with respiratory distress from SNF.      Siginificant Events 6/12 admitted from skilled nursing facility after reported aspiration event, copious secretions, and respiratory distress; working diagnosis was sepsis from CXR-negative aspiration pneumonia and abx were started 6/13 appeared comfortable on 35% ATC. No distress. WBC ct trending down.  Blood cultures growing 2 different CoNS, likely contaminant 6/14 abx stopped, no further fever 6/16 increased work of breathing, upper airway noise, abdominal muscle use.  Bronchoscopy performed as trach exchange cuffless #8 Shiley XLT showed dynamic airway collapse, tracheal collapse. VQ scan 6/15> Neg for acute PE 6/16 continued respiratory distress moved to ICU, changed to cuffed #6 Shiley XLT, placed on MV with some improved comfort Vanc restarted 6/18 to 6/19, then stopped, no further fever, WBC normalized off abx.    6/23 mucus plugging, severe vent dyssynchrony 6/24-25 intermittent jerking, seizures on EEG propofol restarted 6/27 weaned off propofol gtt 6/28 briefly tolerated trach collar in AM  6/29 tolerated trach collar in AM; no further seizure like activity; working with CM/SW for vent-SNF 7/1 Weaning on CPAP/ PS 10/5, CM SW working on [employer], NO seizure activity, secretions are not an issue per nursing 7/1 - 7/5 has remained stable        Assessment & Plan:  Chronic respiratory failure due to anoxic brain injury, dynamic airway collapse (due to tracheobronchomalacia) and advanced COPD No issues with trach secretions today.  Required  sedation overnight for agitation -Continue Brovana and Pulmicort -Consult Pulm for Vent management    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 No improvement in mentation -Continue tizanidine -Continue tube feeds  Seizures History of epilepsy prior to anoxic brain injury, worse since PEA arrest See summary 7/8 No seizurse - Continue  clonazepam 0.5 twice daily - Continue Keppra and Depakote  Type 2 diabetes Glucoses slightly low today - Continue Lantus - Continue SS insulin corrections  Hypertension BP labile, controlled - Continue   COPD FEV1 2019 was 38% -Continue LABA, LAMA, ICS  -Continue Singulair, Claritin, famotidine  Cerebrovascualr disease, secondary prevention - Continue Plavix  Obesity BMI 36  Resolved issues: Positive blood cultures Blood cultures on admission growing actually 3 different species of coag negative staph, appear to all be contaminants.   Hypokalemia Resolved        Disposition: Status is: Inpatient  Remains inpatient appropriate because:Unsafe d/c plan patient was transferred from SNF because of respiratory distress, here has not been weaned off of ventilator.  As we attempt to wean, we will continue seeking safe disposition.  Dispo: The patient is from: SNF              Anticipated d/c is to: LTAC    Difficult to place patient Yes       Level of care: ICU       MDM: The below labs and imaging reports were reviewed and summarized above.  Medication management as above.    DVT prophylaxis:   not Applicable, patient stable for transition to rehab setting  Code Status: Full code Family Communication:  Procedures: VQ scan 6/15> Neg for acute PE CXR 6/16 > New L midlung atelectasis CXR 6/18>  Bibasilar atelectasis CT Head wo Contrast 6/24 > No definite acute intracranial abnormality, progressive cerebral atrophy w/chronic small vessel ischemic disease, acute L  sphenoid sinusitis MRI Brain wo Contrast 6/28> No acute intracranial abnormality advanced atrophy     Subjective: No fever, no diarrhea, no vomiting.  No change in mentation.  A little agitate dovenright, improved with Ativan.  Objective: Vitals:   10/27/20 0700 10/27/20 0725 10/27/20 0727 10/27/20 0800  BP: 92/71 92/71  115/84  Pulse: 91 96  100  Resp: _0 Temp:      TempSrc:      SpO2: 96% 96% 96% 94%  Weight:      Height:        Intake/Output Summary (Last 24 hours) at 10/27/2020 0914 Last data filed at 10/27/2020 0800 Gross per 24 hour  Intake 1360 ml  Output 550 ml  Net 810 ml   Filed Weights   10/25/20 0500 10/26/20 0400 10/27/20 0500  Weight: 89.1 kg 89.3 kg 90.3 kg    Examination: General appearance: Comatose adult female. Unresponsive, tracheostomy in place HEENT:    Pupils equal, no eye movements Skin:   Cardiac: RRR, no murmurs, mild nonpitting peripheral edema Respiratory: Trach in place, on vent, on pressure support, breathing comfortable, lung sounds diminished, no rales, no wheezing Abdomen: Abdomen soft, no guarding, no rigidity, no distension MSK:  Neuro: No spontaneous verbalizations, no spontaneous movements, no response to noxious stimuli, no spontaneous eye movements Psych: Does not respond intelligibly stimuli   Data Reviewed: I have personally reviewed following labs and imaging studies:  CBC: Recent Labs  Lab 10/21/20 0118 10/23/20 1408 10/24/20 0042 10/26/20 0431 10/27/20 0356  WBC 10.8* 9.7 9.2  --   --   NEUTROABS  --  7.4 6.2  --   --   HGB 11.2* 9.5* 10.2* 9.9* 10.5*  HCT 35.9* 30.4* 31.9* 29.0* 31.0*  MCV 88.4 86.4 86.4  --   --   PLT 213 245 247  --   --    Basic Metabolic Panel: Recent Labs  Lab 10/21/20 0118 10/24/20 0042 10/26/20 0431 10/27/20 0356  NA 139 138 141 141  K 4.6 4.7 4.0 4.2  CL 106 105  --   --   CO2 25 25  --   --   GLUCOSE 113* 135*  --   --   BUN 12 13  --   --   CREATININE 0.45 0.43*  --    --   CALCIUM 10.1 10.3  --   --   MG 2.2  --   --   --    GFR: Estimated Creatinine Clearance: 72.3 mL/min (A) (by C-G formula based on SCr of 0.43 mg/dL (L)). Liver Function Tests: No results for input(s): AST, ALT, ALKPHOS, BILITOT, PROT, ALBUMIN in the last 168 hours. No results for input(s): LIPASE, AMYLASE in the last 168 hours. No results for input(s): AMMONIA in the last 168 hours. Coagulation Profile: No results for input(s): INR, PROTIME in the last 168 hours. Cardiac Enzymes: No results for input(s): CKTOTAL, CKMB, CKMBINDEX, TROPONINI in the last 168 hours. BNP (last 3 results) No results for input(s): PROBNP in the last 8760 hours. HbA1C: No results for input(s): HGBA1C in the last 72 hours. CBG: Recent Labs  Lab 10/26/20 1909 10/26/20 2313 10/27/20 0320 10/27/20 0347 10/27/20 0715  GLUCAP 110* 126* 66* 101*  130*   Lipid Profile: No results for input(s): CHOL, HDL, LDLCALC, TRIG, CHOLHDL, LDLDIRECT in the last 72 hours. Thyroid Function Tests: No results for input(s): TSH, T4TOTAL, FREET4, T3FREE, THYROIDAB in the last 72 hours. Anemia Panel: No results for input(s): VITAMINB12, FOLATE, FERRITIN, TIBC, IRON, RETICCTPCT in the last 72 hours. Urine analysis:    Component Value Date/Time   COLORURINE YELLOW 10/22/2020 1659   APPEARANCEUR HAZY (A) 10/22/2020 1659   LABSPEC 1.018 10/22/2020 1659   PHURINE 8.0 10/22/2020 1659   GLUCOSEU NEGATIVE 10/22/2020 1659   HGBUR NEGATIVE 10/22/2020 1659   BILIRUBINUR NEGATIVE 10/22/2020 1659   KETONESUR 5 (A) 10/22/2020 1659   PROTEINUR NEGATIVE 10/22/2020 1659   UROBILINOGEN 0.2 07/03/2014 1456   NITRITE NEGATIVE 10/22/2020 1659   LEUKOCYTESUR NEGATIVE 10/22/2020 1659   Sepsis Labs: _0 (procalcitonin:4,lacticacidven:4)  ) Recent Results (from the past 240 hour(s))  Culture, blood (Routine X 2) w Reflex to ID Panel     Status: Abnormal   Collection Time: 10/23/20 12:26 PM   Specimen: BLOOD LEFT HAND   Result Value Ref Range Status   Specimen Description BLOOD LEFT HAND  Final   Special Requests   Final    BOTTLES DRAWN AEROBIC AND ANAEROBIC Blood Culture adequate volume   Culture  Setup Time (A)  Final    GRAM VARIABLE ROD ANAEROBIC BOTTLE ONLY CRITICAL RESULT CALLED TO, READ BACK BY AND VERIFIED WITH: PHARMD J Iran [id number] AT 1339 BY CM    Culture (A)  Final    BACILLUS SPECIES Standardized susceptibility testing for this organism is not available. Performed at [facility], [place] [address] [place], [place] [address]    Report Status 10/26/2020 FINAL  Final  Blood Culture ID Panel (Reflexed)     Status: None   Collection Time: 10/23/20 12:26 PM  Result Value Ref Range Status   Enterococcus faecalis NOT DETECTED NOT DETECTED Final   Enterococcus Faecium NOT DETECTED NOT DETECTED Final   Listeria monocytogenes NOT DETECTED NOT DETECTED Final   Staphylococcus species NOT DETECTED NOT DETECTED Final   Staphylococcus aureus (BCID) NOT DETECTED NOT DETECTED Final   Staphylococcus epidermidis NOT DETECTED NOT DETECTED Final   Staphylococcus lugdunensis NOT DETECTED NOT DETECTED Final   Streptococcus species NOT DETECTED NOT DETECTED Final   Streptococcus agalactiae NOT DETECTED NOT DETECTED Final   Streptococcus pneumoniae NOT DETECTED NOT DETECTED Final   Streptococcus pyogenes NOT DETECTED NOT DETECTED Final   A.calcoaceticus-baumannii NOT DETECTED NOT DETECTED Final   Bacteroides fragilis NOT DETECTED NOT DETECTED Final   Enterobacterales NOT DETECTED NOT DETECTED Final   Enterobacter cloacae complex NOT DETECTED NOT DETECTED Final   Escherichia coli NOT DETECTED NOT DETECTED Final   Klebsiella aerogenes NOT DETECTED NOT DETECTED Final   Klebsiella oxytoca NOT DETECTED NOT DETECTED Final   Klebsiella pneumoniae NOT DETECTED NOT DETECTED Final   Proteus species NOT DETECTED NOT DETECTED Final   Salmonella species NOT DETECTED NOT DETECTED Final   Serratia marcescens NOT  DETECTED NOT DETECTED Final   Haemophilus influenzae NOT DETECTED NOT DETECTED Final   Neisseria meningitidis NOT DETECTED NOT DETECTED Final   Pseudomonas aeruginosa NOT DETECTED NOT DETECTED Final   Stenotrophomonas maltophilia NOT DETECTED NOT DETECTED Final   Candida albicans NOT DETECTED NOT DETECTED Final   Candida auris NOT DETECTED NOT DETECTED Final   Candida glabrata NOT DETECTED NOT DETECTED Final   Candida krusei NOT DETECTED NOT DETECTED Final   Candida parapsilosis NOT DETECTED NOT DETECTED Final  Candida tropicalis NOT DETECTED NOT DETECTED Final   Cryptococcus neoformans/gattii NOT DETECTED NOT DETECTED Final    Comment: Performed at [facility], [place] [address], [place], [place] [address]  Culture, blood (Routine X 2) w Reflex to ID Panel     Status: None (Preliminary result)   Collection Time: 10/23/20  2:04 PM   Specimen: BLOOD  Result Value Ref Range Status   Specimen Description BLOOD FOOT  Final   Special Requests   Final    BOTTLES DRAWN AEROBIC AND ANAEROBIC Blood Culture adequate volume   Culture   Final    NO GROWTH 4 DAYS Performed at [facility], [place] [address] [place], [place] [address]    Report Status PENDING  Incomplete         Radiology Studies: DG CHEST PORT 1 VIEW  Result Date: 10/27/2020 CLINICAL DATA:  Respiratory failure EXAM: PORTABLE CHEST 1 VIEW COMPARISON:  10/23/2020 FINDINGS: Tracheostomy in satisfactory position. Lungs are clear.  No pleural effusion or pneumothorax. The heart is normal in size. IMPRESSION: No evidence of acute cardiopulmonary disease. Electronically Signed   By: Julian Hy M.D.   On: 10/27/2020 03:11        Scheduled Meds:  arformoterol  15 mcg Nebulization BID   baclofen  5 mg Per Tube TID   budesonide  0.5 mg Nebulization BID   chlorhexidine gluconate (MEDLINE KIT)  15 mL Mouth Rinse BID   Chlorhexidine Gluconate Cloth  6 each Topical Daily   clonazePAM  0.5 mg Per Tube BID    clopidogrel  75 mg Per Tube Daily   enoxaparin (LOVENOX) injection  0.5 mg/kg Subcutaneous Q24H   famotidine  20 mg Per Tube BID   feeding supplement (PROSource TF)  45 mL Per Tube BID   fiber  1 packet Per Tube BID   Gerhardt's butt cream   Topical BID   guaiFENesin  5 mL Per Tube Q6H   hydrocortisone cream   Topical TID   insulin aspart  0-15 Units Subcutaneous Q4H   insulin aspart  5 Units Subcutaneous Q4H   insulin glargine  40 Units Subcutaneous QHS   levETIRAcetam  2,500 mg Per Tube BID   loratadine  10 mg Per Tube Daily   mouth rinse  15 mL Mouth Rinse 10 times per day   metoprolol tartrate  12.5 mg Per Tube BID   montelukast  10 mg Per Tube Daily   nutrition supplement (JUVEN)  1 packet Per Tube BID   revefenacin  175 mcg Nebulization Daily   tiZANidine  2 mg Per Tube QHS   valproic acid  750 mg Per Tube Q12H   Continuous Infusions:  sodium chloride Stopped (10/17/20 1711)   sodium chloride     feeding supplement (JEVITY 1.5 CAL/FIBER) 1,000 mL (10/27/20 0139)     LOS: 26 days    Time spent: 25  minutes    [name], MD [facility] 10/27/2020, 9:14 AM     Please page though Carlos or Epic secure chat:  For [employer], Adult nurse

## 2020-10-27 NOTE — Progress Notes (Signed)
Pt labored on vent, increased work of breathing, in distress with peak pressure alarming, associated with increased RR, HR and BP. Pt repositioned, PRN Fentanyl and repositioning trach did not help.  Contacted [facility] asking for MD to camera in and assess pt. Ground team to bedside and made vent adjustments and order for Ativan given.

## 2020-10-27 NOTE — Progress Notes (Signed)
Kussmaul breathing fighting vent leading to dynamic air trapping alarming etc. placed in ps and seems to tolerate. Cxr benign. Fentanyl no difference. Will try Ativan. May need to crank up enteral agents if want her to tolerate volume control.

## 2020-10-27 NOTE — Progress Notes (Signed)
Hypoglycemic Event  CBG: 66  Treatment: D50 25 mL (12.5 gm)  Symptoms: None  Follow-up CBG: Time: 0347 CBG Result: 101  Possible Reasons for Event: Unknown     Matthewjames Petrasek, Pearla Dubonnet

## 2020-10-28 DIAGNOSIS — R569 Unspecified convulsions: Secondary | ICD-10-CM | POA: Diagnosis not present

## 2020-10-28 DIAGNOSIS — J449 Chronic obstructive pulmonary disease, unspecified: Secondary | ICD-10-CM | POA: Diagnosis not present

## 2020-10-28 DIAGNOSIS — J9621 Acute and chronic respiratory failure with hypoxia: Secondary | ICD-10-CM | POA: Diagnosis not present

## 2020-10-28 DIAGNOSIS — A419 Sepsis, unspecified organism: Secondary | ICD-10-CM | POA: Diagnosis not present

## 2020-10-28 LAB — CULTURE, BLOOD (ROUTINE X 2)
Culture: NO GROWTH
Special Requests: ADEQUATE

## 2020-10-28 LAB — GLUCOSE, CAPILLARY
Glucose-Capillary: 97 mg/dL (ref 70–99)
Glucose-Capillary: 121 mg/dL — ABNORMAL HIGH (ref 70–99)
Glucose-Capillary: 146 mg/dL — ABNORMAL HIGH (ref 70–99)
Glucose-Capillary: 128 mg/dL — ABNORMAL HIGH (ref 70–99)
Glucose-Capillary: 126 mg/dL — ABNORMAL HIGH (ref 70–99)
Glucose-Capillary: 110 mg/dL — ABNORMAL HIGH (ref 70–99)

## 2020-10-28 NOTE — Progress Notes (Signed)
[facility] PROGRESS NOTE    Sonia Drake  HCW:[id number] DOB: 1955/09/24 DOA: 10/12/2020 PCP: Garwin Brothers, MD      Brief Narrative:  Mrs. Berkey is a 65 y.o. 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      Siginificant Events 6/12 admitted from skilled nursing facility after reported aspiration event, copious secretions, and respiratory distress; working diagnosis was sepsis from CXR-negative aspiration pneumonia and abx were started 6/13 appeared comfortable on 35% ATC. No distress. WBC ct trending down.  Blood cultures growing 2 different CoNS, likely contaminant 6/14 abx stopped, no further fever 6/16 increased work of breathing, upper airway noise, abdominal muscle use.  Bronchoscopy performed as trach exchange cuffless #8 Shiley XLT showed dynamic airway collapse, tracheal collapse. VQ scan 6/15> Neg for acute PE 6/16 continued respiratory distress moved to ICU, changed to cuffed #6 Shiley XLT, placed on MV with some improved comfort Vanc restarted 6/18 to 6/19, then stopped, no further fever, WBC normalized off abx.    6/23 mucus plugging, severe vent dyssynchrony 6/24-25 intermittent jerking, seizures on EEG propofol restarted 6/27 weaned off propofol gtt 6/28 briefly tolerated trach collar in AM  6/29 tolerated trach collar in AM; no further seizure like activity; working with CM/SW for vent-SNF 7/1 Weaning on CPAP/ PS 10/5, CM SW working on [employer], NO seizure activity, secretions are not an issue per nursing 7/1 - 7/5 has remained stable        Assessment & Plan:  Chronic respiratory failure due to anoxic brain injury, dynamic airway collapse (due to tracheobronchomalacia) and advanced COPD  No issues with trach secretions. Patient was  weaned to pressure support overnight last night and into this morning, midmorning she developed dyssynchrony, which only resolved with fentanyl and Ativan, and then resuming full mechanical ventilation.  Unable to wean to trach collar today. 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 No improvement in mentation -Continue tizanidine and tube feeds  Seizures History of epilepsy prior to anoxic brain injury, worse since PEA arrest See summary 7/8 No seizures - Continue clonazepam 0.5 twice daily, Keppra, and Depakote  Type 2 diabetes Glucose normal - Continue Lantus, sliding scale corrections  Hypertension Blood pressure controlled - Continue metoprolol  Advanced COPD FEV1 2019 was 38% No wheezing on exam - Continue LABA, LAMA, ICS - Continue Singulair, Claritin, famotidine   Cerebrovascular disease, secondary prevention -Continue Plavix  Obesity BMI 36  Resolved issues: Positive blood cultures Blood cultures on admission growing actually 3 different species of coag negative staph, appear to all be contaminants.   Hypokalemia Resolved        Disposition: Status is: Inpatient  Remains inpatient appropriate because:Unsafe d/c plan patient was transferred from SNF because of respiratory distress, here has not been weaned off of ventilator.  As we attempt to wean, we will continue seeking safe disposition.  Dispo: The patient is from: SNF              Anticipated d/c is to: LTAC    Difficult to place patient Yes       Level of care: ICU       MDM: The below labs and imaging reports were reviewed and summarized above.  Medication  management as above.    DVT prophylaxis:   not Applicable, patient stable for transition to rehab setting  Code Status: Full code Family Communication:        Procedures: VQ scan 6/15> Neg for acute PE CXR 6/16 > New L midlung atelectasis CXR  6/18>  Bibasilar atelectasis CT Head wo Contrast 6/24 > No definite acute intracranial abnormality, progressive cerebral atrophy w/chronic small vessel ischemic disease, acute L sphenoid sinusitis MRI Brain wo Contrast 6/28> No acute intracranial abnormality advanced atrophy     Subjective: No fever overnight.  No vomiting or diarrhea.  No change in mentation.  Agitated this morning, improved with Ativan and fentanyl, had to resume full mechanical ventilation  Objective: Vitals:   10/28/20 1200 10/28/20 1300 10/28/20 1400 10/28/20 1500  BP: (!) 89/70 95/60 (!) 85/73 107/87  Pulse: 94 (!) 116 (!) 105 100  Resp: 19 (!) 26 15 16   Temp: 98.2 F (36.8 C)     TempSrc: Axillary     SpO2: 94% 100% 95% 98%  Weight:      Height:        Intake/Output Summary (Last 24 hours) at 10/28/2020 1521 Last data filed at 10/28/2020 1200 Gross per 24 hour  Intake 1165 ml  Output 1050 ml  Net 115 ml   Filed Weights   10/26/20 0400 10/27/20 0500 10/28/20 0500  Weight: 89.3 kg 90.3 kg 89.4 kg    Examination: General appearance: Comatose adult female, unresponsive, tracheostomy and PEG tube in place HEENT:    Sclera anicteric, conjunctive a, lids and lashes normal.  Pupils equal, no eye movements no nasal deformity, discharge, or epistaxis Skin: No suspicious rashes or lesions, skin warm and dry Cardiac: Regular rate and rhythm, nonpitting peripheral edema in all 4 extremities, no murmurs, JVP not visible Respiratory: Trach in place, on vent, full mechanical ventilation, no wheezing or rales appreciated, no excessive secretions Abdomen: Abdomen soft, no guarding, no rigidity, no distention MSK:  Neuro: No spontaneous eye movements, spontaneous movements, spontaneous verbalizations.  No response to noxious stimuli.   Psych: Does not respond intelligibly stimuli   Data Reviewed: I have personally reviewed following labs and imaging studies:  CBC: Recent Labs  Lab 10/23/20 1408 10/24/20 0042  10/26/20 0431 10/27/20 0356  WBC 9.7 9.2  --   --   NEUTROABS 7.4 6.2  --   --   HGB 9.5* 10.2* 9.9* 10.5*  HCT 30.4* 31.9* 29.0* 31.0*  MCV 86.4 86.4  --   --   PLT 245 247  --   --    Basic Metabolic Panel: Recent Labs  Lab 10/24/20 0042 10/26/20 0431 10/27/20 0356  NA 138 141 141  K 4.7 4.0 4.2  CL 105  --   --   CO2 25  --   --   GLUCOSE 135*  --   --   BUN 13  --   --   CREATININE 0.43*  --   --   CALCIUM 10.3  --   --    GFR: Estimated Creatinine Clearance: 71.9 mL/min (A) (by C-G formula based on SCr of 0.43 mg/dL (L)). Liver Function Tests: No results for input(s): AST, ALT, ALKPHOS, BILITOT, PROT, ALBUMIN in the last 168 hours. No results for input(s): LIPASE, AMYLASE in the last 168 hours. No results for input(s): AMMONIA in the last 168 hours. Coagulation Profile: No results for input(s): INR, PROTIME in the last 168 hours. Cardiac Enzymes: No results for input(s): CKTOTAL, CKMB, CKMBINDEX,  TROPONINI in the last 168 hours. BNP (last 3 results) No results for input(s): PROBNP in the last 8760 hours. HbA1C: No results for input(s): HGBA1C in the last 72 hours. CBG: Recent Labs  Lab 10/27/20 1905 10/27/20 2315 10/28/20 0329 10/28/20 0718 10/28/20 1141  GLUCAP 128* 108* 97 121* 146*   Lipid Profile: No results for input(s): CHOL, HDL, LDLCALC, TRIG, CHOLHDL, LDLDIRECT in the last 72 hours. Thyroid Function Tests: No results for input(s): TSH, T4TOTAL, FREET4, T3FREE, THYROIDAB in the last 72 hours. Anemia Panel: No results for input(s): VITAMINB12, FOLATE, FERRITIN, TIBC, IRON, RETICCTPCT in the last 72 hours. Urine analysis:    Component Value Date/Time   COLORURINE YELLOW 10/22/2020 1659   APPEARANCEUR HAZY (A) 10/22/2020 1659   LABSPEC 1.018 10/22/2020 1659   PHURINE 8.0 10/22/2020 1659   GLUCOSEU NEGATIVE 10/22/2020 1659   HGBUR NEGATIVE 10/22/2020 1659   BILIRUBINUR NEGATIVE 10/22/2020 1659   KETONESUR 5 (A) 10/22/2020 1659   PROTEINUR  NEGATIVE 10/22/2020 1659   UROBILINOGEN 0.2 07/03/2014 1456   NITRITE NEGATIVE 10/22/2020 1659   LEUKOCYTESUR NEGATIVE 10/22/2020 1659   Sepsis Labs: @LABRCNTIP (procalcitonin:4,lacticacidven:4)  ) Recent Results (from the past 240 hour(s))  Culture, blood (Routine X 2) w Reflex to ID Panel     Status: Abnormal   Collection Time: 10/23/20 12:26 PM   Specimen: BLOOD LEFT HAND  Result Value Ref Range Status   Specimen Description BLOOD LEFT HAND  Final   Special Requests   Final    BOTTLES DRAWN AEROBIC AND ANAEROBIC Blood Culture adequate volume   Culture  Setup Time (A)  Final    GRAM VARIABLE ROD ANAEROBIC BOTTLE ONLY CRITICAL RESULT CALLED TO, READ BACK BY AND VERIFIED WITH: PHARMD J Iran [id number] AT 1339 BY CM    Culture (A)  Final    BACILLUS SPECIES Standardized susceptibility testing for this organism is not available. Performed at [facility], [place] [address], [place], [place] [address]    Report Status 10/26/2020 FINAL  Final  Blood Culture ID Panel (Reflexed)     Status: None   Collection Time: 10/23/20 12:26 PM  Result Value Ref Range Status   Enterococcus faecalis NOT DETECTED NOT DETECTED Final   Enterococcus Faecium NOT DETECTED NOT DETECTED Final   Listeria monocytogenes NOT DETECTED NOT DETECTED Final   Staphylococcus species NOT DETECTED NOT DETECTED Final   Staphylococcus aureus (BCID) NOT DETECTED NOT DETECTED Final   Staphylococcus epidermidis NOT DETECTED NOT DETECTED Final   Staphylococcus lugdunensis NOT DETECTED NOT DETECTED Final   Streptococcus species NOT DETECTED NOT DETECTED Final   Streptococcus agalactiae NOT DETECTED NOT DETECTED Final   Streptococcus pneumoniae NOT DETECTED NOT DETECTED Final   Streptococcus pyogenes NOT DETECTED NOT DETECTED Final   A.calcoaceticus-baumannii NOT DETECTED NOT DETECTED Final   Bacteroides fragilis NOT DETECTED NOT DETECTED Final   Enterobacterales NOT DETECTED NOT DETECTED Final   Enterobacter cloacae  complex NOT DETECTED NOT DETECTED Final   Escherichia coli NOT DETECTED NOT DETECTED Final   Klebsiella aerogenes NOT DETECTED NOT DETECTED Final   Klebsiella oxytoca NOT DETECTED NOT DETECTED Final   Klebsiella pneumoniae NOT DETECTED NOT DETECTED Final   Proteus species NOT DETECTED NOT DETECTED Final   Salmonella species NOT DETECTED NOT DETECTED Final   Serratia marcescens NOT DETECTED NOT DETECTED Final   Haemophilus influenzae NOT DETECTED NOT DETECTED Final   Neisseria meningitidis NOT DETECTED NOT DETECTED Final   Pseudomonas aeruginosa NOT DETECTED NOT DETECTED Final   Stenotrophomonas  maltophilia NOT DETECTED NOT DETECTED Final   Candida albicans NOT DETECTED NOT DETECTED Final   Candida auris NOT DETECTED NOT DETECTED Final   Candida glabrata NOT DETECTED NOT DETECTED Final   Candida krusei NOT DETECTED NOT DETECTED Final   Candida parapsilosis NOT DETECTED NOT DETECTED Final   Candida tropicalis NOT DETECTED NOT DETECTED Final   Cryptococcus neoformans/gattii NOT DETECTED NOT DETECTED Final    Comment: Performed at [facility], [place] [address], [place], [place] [address]  Culture, blood (Routine X 2) w Reflex to ID Panel     Status: None   Collection Time: 10/23/20  2:04 PM   Specimen: BLOOD  Result Value Ref Range Status   Specimen Description BLOOD FOOT  Final   Special Requests   Final    BOTTLES DRAWN AEROBIC AND ANAEROBIC Blood Culture adequate volume   Culture   Final    NO GROWTH 5 DAYS Performed at [facility], [place] [address] [place], [place] [address]    Report Status 10/28/2020 FINAL  Final         Radiology Studies: DG CHEST PORT 1 VIEW  Result Date: 10/27/2020 CLINICAL DATA:  Respiratory failure EXAM: PORTABLE CHEST 1 VIEW COMPARISON:  10/23/2020 FINDINGS: Tracheostomy in satisfactory position. Lungs are clear.  No pleural effusion or pneumothorax. The heart is normal in size. IMPRESSION: No evidence of acute cardiopulmonary disease.  Electronically Signed   By: Julian Hy M.D.   On: 10/27/2020 03:11        Scheduled Meds:  arformoterol  15 mcg Nebulization BID   baclofen  5 mg Per Tube TID   budesonide  0.5 mg Nebulization BID   chlorhexidine gluconate (MEDLINE KIT)  15 mL Mouth Rinse BID   Chlorhexidine Gluconate Cloth  6 each Topical Daily   clonazePAM  0.5 mg Per Tube BID   clopidogrel  75 mg Per Tube Daily   enoxaparin (LOVENOX) injection  0.5 mg/kg Subcutaneous Q24H   famotidine  20 mg Per Tube BID   feeding supplement (PROSource TF)  45 mL Per Tube BID   fiber  1 packet Per Tube BID   Gerhardt's butt cream   Topical BID   guaiFENesin  5 mL Per Tube Q6H   hydrocortisone cream   Topical TID   insulin aspart  0-15 Units Subcutaneous Q4H   insulin aspart  5 Units Subcutaneous Q4H   insulin glargine  40 Units Subcutaneous QHS   levETIRAcetam  2,500 mg Per Tube BID   loratadine  10 mg Per Tube Daily   mouth rinse  15 mL Mouth Rinse 10 times per day   metoprolol tartrate  12.5 mg Per Tube BID   montelukast  10 mg Per Tube Daily   nutrition supplement (JUVEN)  1 packet Per Tube BID   revefenacin  175 mcg Nebulization Daily   tiZANidine  2 mg Per Tube QHS   valproic acid  750 mg Per Tube Q12H   Continuous Infusions:  sodium chloride     feeding supplement (JEVITY 1.5 CAL/FIBER) 1,000 mL (10/27/20 0139)     LOS: 27 days    Time spent: 25 minutes    [name], MD [facility] 10/28/2020, 3:21 PM     Please page though [place] or Epic secure chat:  For [employer], Adult nurse

## 2020-10-28 NOTE — Progress Notes (Signed)
Pt placed back on full support due to pt's RR >40bpm, WOB increased but very rhythmic. Pt seemed to be neuro storming, issue resolved with Fentanyl.

## 2020-10-29 DIAGNOSIS — R569 Unspecified convulsions: Secondary | ICD-10-CM | POA: Diagnosis not present

## 2020-10-29 DIAGNOSIS — J9621 Acute and chronic respiratory failure with hypoxia: Secondary | ICD-10-CM | POA: Diagnosis not present

## 2020-10-29 DIAGNOSIS — J449 Chronic obstructive pulmonary disease, unspecified: Secondary | ICD-10-CM | POA: Diagnosis not present

## 2020-10-29 DIAGNOSIS — A419 Sepsis, unspecified organism: Secondary | ICD-10-CM | POA: Diagnosis not present

## 2020-10-29 LAB — GLUCOSE, CAPILLARY
Glucose-Capillary: 87 mg/dL (ref 70–99)
Glucose-Capillary: 133 mg/dL — ABNORMAL HIGH (ref 70–99)
Glucose-Capillary: 125 mg/dL — ABNORMAL HIGH (ref 70–99)
Glucose-Capillary: 141 mg/dL — ABNORMAL HIGH (ref 70–99)
Glucose-Capillary: 104 mg/dL — ABNORMAL HIGH (ref 70–99)
Glucose-Capillary: 104 mg/dL — ABNORMAL HIGH (ref 70–99)

## 2020-10-29 NOTE — Progress Notes (Signed)
[facility] PROGRESS NOTE    Sonia Drake  KWI:[id number] DOB: 11/28/1955 DOA: 09/29/2020 PCP: Garwin Brothers, MD      Brief Narrative:  Sonia Drake is a 65 y.o. 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      Siginificant Events 6/12 admitted from skilled nursing facility after reported aspiration event, copious secretions, and respiratory distress; working diagnosis was sepsis from CXR-negative aspiration pneumonia and abx were started 6/13 appeared comfortable on 35% ATC. No distress. WBC ct trending down.  Blood cultures growing 2 different CoNS, likely contaminant 6/14 abx stopped, no further fever 6/16 increased work of breathing, upper airway noise, abdominal muscle use.  Bronchoscopy performed as trach exchange cuffless #8 Shiley XLT showed dynamic airway collapse, tracheal collapse. VQ scan 6/15> Neg for acute PE 6/16 continued respiratory distress moved to ICU, changed to cuffed #6 Shiley XLT, placed on MV with some improved comfort Vanc restarted 6/18 to 6/19, then stopped, no further fever, WBC normalized off abx.    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 No improvement in mentation - Continue tizanidine and tube feeds  Seizure disorder History of epilepsy prior to anoxic brain injury, worse since PEA arrest See summary 7/8 No seizures overnight  - Continue clonazepam 0.5 twice daily, Keppra, and Depakote  Type 2 diabetes Glucose controlled - Continue Lantus, sliding scale corrections  Hypertension BP soft - Continue metoprolol, hold for SBP < 100  Advanced COPD FEV1 2019 was 38% No wheezing - Continue LABA, LAMA, ICS - Continue Singulair, Claritin, famotidine   Cerebrovascular disease, secondary prevention -Continue Plavix  Obesity BMI 36  Resolved issues: Positive blood cultures Blood cultures on admission growing actually 3 different species of coag negative staph, appear to all be contaminants.   Hypokalemia Resolved        Disposition: Status is: Inpatient  Remains inpatient appropriate because:Unsafe d/c plan patient was transferred from SNF because of respiratory distress, here has not been weaned off of ventilator.  As we attempt to wean, we will continue seeking safe disposition.  Dispo: The patient is from: SNF              Anticipated d/c is to: LTAC    Difficult to place patient Yes       Level of care: ICU       MDM: The below labs and imaging reports were reviewed and summarized above.  Medication management as above.    DVT prophylaxis:   not Applicable, patient stable for transition to rehab setting  Code Status:  Full code Family Communication:        Procedures: VQ scan 6/15> Neg for acute PE CXR 6/16 > New L midlung atelectasis CXR 6/18>  Bibasilar atelectasis CT Head wo Contrast 6/24 > No definite acute intracranial abnormality, progressive cerebral atrophy w/chronic small vessel ischemic disease, acute L sphenoid  sinusitis MRI Brain wo Contrast 6/28> No acute intracranial abnormality advanced atrophy     Subjective: No no fever, change in mentation, vomiting, diarrhea.  She was agitated yesterday afternoon failed weaning.   Objective: Vitals:   10/29/20 0600 10/29/20 0733 10/29/20 0800 10/29/20 0933  BP: 119/90  113/85 111/74  Pulse: (!) 101  (!) 106 95  Resp: _0 Temp:  99.2 F (37.3 C)    TempSrc:  Oral    SpO2: 99% 96% 92% 95%  Weight:      Height:        Intake/Output Summary (Last 24 hours) at 10/29/2020 0941 Last data filed at 10/29/2020 0900 Gross per 24 hour  Intake 1620 ml  Output 900 ml  Net 720 ml   Filed Weights   10/27/20 0500 10/28/20 0500 10/29/20 0500  Weight: 90.3 kg 89.4 kg 91.1 kg    Examination: General appearance: Comatose adult female, unresponsive, tracheostomy and PEG tube in place     HEENT: Sclera anicteric, conjunctival lids and lashes normal.  Pupils equal, she opens her eyes spontaneously but does not make eye contact or move eyes to stimuli. Skin:  Cardiac: Regular rate and rhythm, nonpitting peripheral edema in all 4 extremities, no murmurs, JVP not visible Respiratory: Tracheostomy in place, no excessive secretions, on full mechanical ventilation, no wheezing or rales appreciated Abdomen: Abdomen soft, no grimace to palpation, no guarding, no rigidity, no distention MSK:  Neuro: Spontaneously, no spontaneous movements, no spontaneous verbalizations, does not respond Ohrt withdraw from noxious stimuli. Psych: No intelligible responses       Data Reviewed: I have personally reviewed following labs and imaging studies:  CBC: Recent Labs  Lab 10/23/20 1408 10/24/20 0042 10/26/20 0431 10/27/20 0356  WBC 9.7 9.2  --   --   NEUTROABS 7.4 6.2  --   --   HGB 9.5* 10.2* 9.9* 10.5*  HCT 30.4* 31.9* 29.0* 31.0*  MCV 86.4 86.4  --   --   PLT 245 247  --   --    Basic Metabolic Panel: Recent Labs  Lab 10/24/20 0042 10/26/20 0431  10/27/20 0356  NA 138 141 141  K 4.7 4.0 4.2  CL 105  --   --   CO2 25  --   --   GLUCOSE 135*  --   --   BUN 13  --   --   CREATININE 0.43*  --   --   CALCIUM 10.3  --   --    GFR: Estimated Creatinine Clearance: 72.6 mL/min (A) (by C-G formula based on SCr of 0.43 mg/dL (L)). Liver Function Tests: No results for input(s): AST, ALT, ALKPHOS, BILITOT, PROT, ALBUMIN in the last 168 hours. No results for input(s): LIPASE, AMYLASE in the last 168 hours. No results for input(s): AMMONIA in the last 168 hours. Coagulation Profile: No results for input(s): INR, PROTIME in the last 168 hours. Cardiac Enzymes: No results for input(s): CKTOTAL, CKMB, CKMBINDEX, TROPONINI in the last 168 hours. BNP (last 3 results) No results for input(s): PROBNP in the last 8760 hours. HbA1C: No results for input(s): HGBA1C in the last 72 hours. CBG: Recent  Labs  Lab 10/28/20 1607 10/28/20 1914 10/28/20 2304 10/29/20 0303 10/29/20 0710  GLUCAP 128* 126* 110* 87 133*   Lipid Profile: No results for input(s): CHOL, HDL, LDLCALC, TRIG, CHOLHDL, LDLDIRECT in the last 72 hours. Thyroid Function Tests: No results for input(s): TSH, T4TOTAL, FREET4, T3FREE, THYROIDAB in the last 72 hours. Anemia Panel: No results for input(s): VITAMINB12, FOLATE, FERRITIN, TIBC, IRON, RETICCTPCT in the last 72 hours. Urine analysis:    Component Value Date/Time   COLORURINE YELLOW 10/22/2020 1659   APPEARANCEUR HAZY (A) 10/22/2020 1659   LABSPEC 1.018 10/22/2020 1659   PHURINE 8.0 10/22/2020 1659   GLUCOSEU NEGATIVE 10/22/2020 1659   HGBUR NEGATIVE 10/22/2020 1659   BILIRUBINUR NEGATIVE 10/22/2020 1659   KETONESUR 5 (A) 10/22/2020 1659   PROTEINUR NEGATIVE 10/22/2020 1659   UROBILINOGEN 0.2 07/03/2014 1456   NITRITE NEGATIVE 10/22/2020 1659   LEUKOCYTESUR NEGATIVE 10/22/2020 1659   Sepsis Labs: _0 (procalcitonin:4,lacticacidven:4)  ) Recent Results (from the past 240 hour(s))  Culture, blood  (Routine X 2) w Reflex to ID Panel     Status: Abnormal   Collection Time: 10/23/20 12:26 PM   Specimen: BLOOD LEFT HAND  Result Value Ref Range Status   Specimen Description BLOOD LEFT HAND  Final   Special Requests   Final    BOTTLES DRAWN AEROBIC AND ANAEROBIC Blood Culture adequate volume   Culture  Setup Time (A)  Final    GRAM VARIABLE ROD ANAEROBIC BOTTLE ONLY CRITICAL RESULT CALLED TO, READ BACK BY AND VERIFIED WITH: PHARMD J Iran [id number] AT 1339 BY CM    Culture (A)  Final    BACILLUS SPECIES Standardized susceptibility testing for this organism is not available. Performed at [facility], [place] [address] [place], [place] [address]    Report Status 10/26/2020 FINAL  Final  Blood Culture ID Panel (Reflexed)     Status: None   Collection Time: 10/23/20 12:26 PM  Result Value Ref Range Status   Enterococcus faecalis NOT DETECTED NOT DETECTED Final   Enterococcus Faecium NOT DETECTED NOT DETECTED Final   Listeria monocytogenes NOT DETECTED NOT DETECTED Final   Staphylococcus species NOT DETECTED NOT DETECTED Final   Staphylococcus aureus (BCID) NOT DETECTED NOT DETECTED Final   Staphylococcus epidermidis NOT DETECTED NOT DETECTED Final   Staphylococcus lugdunensis NOT DETECTED NOT DETECTED Final   Streptococcus species NOT DETECTED NOT DETECTED Final   Streptococcus agalactiae NOT DETECTED NOT DETECTED Final   Streptococcus pneumoniae NOT DETECTED NOT DETECTED Final   Streptococcus pyogenes NOT DETECTED NOT DETECTED Final   A.calcoaceticus-baumannii NOT DETECTED NOT DETECTED Final   Bacteroides fragilis NOT DETECTED NOT DETECTED Final   Enterobacterales NOT DETECTED NOT DETECTED Final   Enterobacter cloacae complex NOT DETECTED NOT DETECTED Final   Escherichia coli NOT DETECTED NOT DETECTED Final   Klebsiella aerogenes NOT DETECTED NOT DETECTED Final   Klebsiella oxytoca NOT DETECTED NOT DETECTED Final   Klebsiella pneumoniae NOT DETECTED NOT DETECTED Final    Proteus species NOT DETECTED NOT DETECTED Final   Salmonella species NOT DETECTED NOT DETECTED Final   Serratia marcescens NOT DETECTED NOT DETECTED Final   Haemophilus influenzae NOT DETECTED NOT DETECTED Final   Neisseria meningitidis NOT DETECTED NOT DETECTED Final   Pseudomonas aeruginosa NOT DETECTED NOT DETECTED Final   Stenotrophomonas maltophilia NOT DETECTED NOT DETECTED Final   Candida albicans NOT DETECTED NOT DETECTED Final   Candida auris NOT DETECTED NOT DETECTED Final   Candida glabrata NOT DETECTED NOT DETECTED Final  Candida krusei NOT DETECTED NOT DETECTED Final   Candida parapsilosis NOT DETECTED NOT DETECTED Final   Candida tropicalis NOT DETECTED NOT DETECTED Final   Cryptococcus neoformans/gattii NOT DETECTED NOT DETECTED Final    Comment: Performed at [facility], [place] [address], [place], [place] [address]  Culture, blood (Routine X 2) w Reflex to ID Panel     Status: None   Collection Time: 10/23/20  2:04 PM   Specimen: BLOOD  Result Value Ref Range Status   Specimen Description BLOOD FOOT  Final   Special Requests   Final    BOTTLES DRAWN AEROBIC AND ANAEROBIC Blood Culture adequate volume   Culture   Final    NO GROWTH 5 DAYS Performed at [facility], [place] [address], [place], [place] [address]    Report Status 10/28/2020 FINAL  Final         Radiology Studies: No results found.      Scheduled Meds:  arformoterol  15 mcg Nebulization BID   baclofen  5 mg Per Tube TID   budesonide  0.5 mg Nebulization BID   chlorhexidine gluconate (MEDLINE KIT)  15 mL Mouth Rinse BID   Chlorhexidine Gluconate Cloth  6 each Topical Daily   clonazePAM  0.5 mg Per Tube BID   clopidogrel  75 mg Per Tube Daily   enoxaparin (LOVENOX) injection  0.5 mg/kg Subcutaneous Q24H   famotidine  20 mg Per Tube BID   feeding supplement (PROSource TF)  45 mL Per Tube BID   fiber  1 packet Per Tube BID   Gerhardt's butt cream   Topical BID   guaiFENesin  5 mL  Per Tube Q6H   hydrocortisone cream   Topical TID   insulin aspart  0-15 Units Subcutaneous Q4H   insulin aspart  5 Units Subcutaneous Q4H   insulin glargine  40 Units Subcutaneous QHS   levETIRAcetam  2,500 mg Per Tube BID   loratadine  10 mg Per Tube Daily   mouth rinse  15 mL Mouth Rinse 10 times per day   metoprolol tartrate  12.5 mg Per Tube BID   montelukast  10 mg Per Tube Daily   nutrition supplement (JUVEN)  1 packet Per Tube BID   revefenacin  175 mcg Nebulization Daily   tiZANidine  2 mg Per Tube QHS   valproic acid  750 mg Per Tube Q12H   Continuous Infusions:  sodium chloride     feeding supplement (JEVITY 1.5 CAL/FIBER) 1,000 mL (10/29/20 0227)     LOS: 28 days    Time spent: 25  minutes    [name], MD [facility] 10/29/2020, 9:41 AM     Please page though [place] or Epic secure chat:  For [employer], Adult nurse

## 2020-10-29 NOTE — Progress Notes (Signed)
Pt failed wean on ventilator x2. Attempt one made at 0800, pt was apniec. Attempt two was just made, pt was had little respiratory effort with a PS of 18 and volumes of only 15mL. RN made aware.

## 2020-10-30 ENCOUNTER — Inpatient Hospital Stay (HOSPITAL_COMMUNITY): Payer: Medicare Other

## 2020-10-30 DIAGNOSIS — A419 Sepsis, unspecified organism: Secondary | ICD-10-CM | POA: Diagnosis not present

## 2020-10-30 DIAGNOSIS — J449 Chronic obstructive pulmonary disease, unspecified: Secondary | ICD-10-CM | POA: Diagnosis not present

## 2020-10-30 DIAGNOSIS — R569 Unspecified convulsions: Secondary | ICD-10-CM | POA: Diagnosis not present

## 2020-10-30 DIAGNOSIS — J9621 Acute and chronic respiratory failure with hypoxia: Secondary | ICD-10-CM | POA: Diagnosis not present

## 2020-10-30 LAB — GLUCOSE, CAPILLARY
Glucose-Capillary: 119 mg/dL — ABNORMAL HIGH (ref 70–99)
Glucose-Capillary: 113 mg/dL — ABNORMAL HIGH (ref 70–99)
Glucose-Capillary: 123 mg/dL — ABNORMAL HIGH (ref 70–99)
Glucose-Capillary: 115 mg/dL — ABNORMAL HIGH (ref 70–99)
Glucose-Capillary: 127 mg/dL — ABNORMAL HIGH (ref 70–99)
Glucose-Capillary: 102 mg/dL — ABNORMAL HIGH (ref 70–99)

## 2020-10-30 NOTE — Progress Notes (Signed)
[facility] PROGRESS NOTE    Sonia Drake  XYI:[id number] DOB: 09-04-1955 DOA: 09/23/2020 PCP: Garwin Brothers, MD      Brief Narrative:  Sonia Drake is a 65 y.o. 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      Siginificant Events 6/12 admitted from skilled nursing facility after reported aspiration event, copious secretions, and respiratory distress; working diagnosis was sepsis from CXR-negative aspiration pneumonia and abx were started 6/13 appeared comfortable on 35% ATC. No distress. WBC ct trending down.  Blood cultures growing 2 different CoNS, likely contaminant 6/14 abx stopped, no further fever 6/16 increased work of breathing, upper airway noise, abdominal muscle use.  Bronchoscopy performed as trach exchange cuffless #8 Shiley XLT showed dynamic airway collapse, tracheal collapse. VQ scan 6/15> Neg for acute PE 6/16 continued respiratory distress moved to ICU, changed to cuffed #6 Shiley XLT, placed on MV with some improved comfort Vanc restarted 6/18 to 6/19, then stopped, no further fever, WBC normalized off abx.    6/23 mucus plugging, severe vent dyssynchrony 6/24-25 intermittent jerking, seizures on EEG propofol restarted 6/27 weaned off propofol gtt 6/28 briefly tolerated trach collar in AM  6/29 tolerated trach collar in AM; no further seizure like activity; working with CM/SW for vent-SNF 7/1 Weaning on CPAP/ PS 10/5, CM SW working on [employer], NO seizure activity, secretions are not an issue per nursing 7/1 - 7/5 has remained stable 7/5-7/11 has consistently failed to wean to trach collar        Assessment & Plan:  Chronic respiratory failure due to anoxic brain injury, dynamic airway collapse (due to tracheobronchomalacia) and advanced  COPD Unable to wean to trach collar today -Continue full mechanical ventilation - Consult critical care for ventilator assistance - Continue Brovana and Pulmicort     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 No improvement in mentation -Continue tizanidine, tube feeds, baclofen  Seizure disorder History of epilepsy prior to anoxic brain injury, worse since PEA arrest See summary 7/8 No new seizures - Continue clonazepam 0.5 twice daily - Continue Keppra at current dose which she has been stable on since prior to admission - Continue Depakote  Type 2 diabetes Glucose normal - Continue this, sliding scale corrections  Hypertension Blood pressure soft - Continue metoprolol with hold parameters  Advanced COPD FEV1 2019 was 38% No wheezing -Continue LABA/LAMA, ICS - Continue Singulair, Claritin, famotidine  Cerebrovascular disease, secondary prevention -Continue Plavix  Obesity BMI 36  Resolved issues: Positive blood cultures Blood cultures on admission growing actually 3 different species of coag negative staph, appear to all be contaminants.   Hypokalemia Resolved        Disposition: Status is: Inpatient  Remains inpatient appropriate because:Unsafe d/c plan patient was transferred from SNF because of respiratory distress, here has not been weaned off of ventilator.  As we attempt to wean, we will continue seeking safe disposition.  Dispo: The patient is from: SNF              Anticipated d/c is to: LTAC    Difficult to place patient Yes       Level of care: ICU       MDM: The below labs and imaging reports were reviewed and summarized above.  Medication  management as above.    DVT prophylaxis:   not Applicable, patient stable for transition to rehab setting  Code Status: Full code Family Communication:        Procedures: VQ scan 6/15> Neg for acute PE CXR 6/16 > New L midlung atelectasis CXR  6/18>  Bibasilar atelectasis CT Head wo Contrast 6/24 > No definite acute intracranial abnormality, progressive cerebral atrophy w/chronic small vessel ischemic disease, acute L sphenoid sinusitis MRI Brain wo Contrast 6/28> No acute intracranial abnormality advanced atrophy     Subjective: No no fever, change in mentation, vomiting, diarrhea.   Objective: Vitals:   10/30/20 1036 10/30/20 1100 10/30/20 1440 10/30/20 1540  BP:      Pulse:   77   Resp:   (!) 22   Temp:  (!) 97.2 F (36.2 C)  (!) 97 F (36.1 C)  TempSrc:  Axillary  Axillary  SpO2: 93%  97%   Weight:      Height:        Intake/Output Summary (Last 24 hours) at 10/30/2020 1603 Last data filed at 10/30/2020 1000 Gross per 24 hour  Intake 900 ml  Output 950 ml  Net -50 ml   Filed Weights   10/28/20 0500 10/29/20 0500 10/30/20 0323  Weight: 89.4 kg 91.1 kg 91 kg    Examination: General appearance: Comatose adult female, unresponsive, tracheostomy and PEG tube in place     HEENT: Sclera anicteric, conjunctival lids and lashes normal.  Pupils equal, she opens her eyes spontaneously but does not make eye contact or move eyes to stimuli. Skin:  Cardiac: Regular rate and rhythm, nonpitting peripheral edema in all 4 extremities, no murmurs, JVP not visible Respiratory: Tracheostomy in place, no excessive secretions, on full mechanical ventilation, no wheezing or rales appreciated Abdomen: Abdomen soft, no grimace to palpation, no guarding, no rigidity, no distention MSK:  Neuro: Spontaneously, no spontaneous movements, no spontaneous verbalizations, does not respond Ohrt withdraw from noxious stimuli. Psych: No intelligible responses       Data Reviewed: I have personally reviewed following labs and imaging studies:  CBC: Recent Labs  Lab 10/24/20 0042 10/26/20 0431 10/27/20 0356  WBC 9.2  --   --   NEUTROABS 6.2  --   --   HGB 10.2* 9.9* 10.5*  HCT 31.9* 29.0* 31.0*  MCV 86.4  --   --   PLT 247  --    --    Basic Metabolic Panel: Recent Labs  Lab 10/24/20 0042 10/26/20 0431 10/27/20 0356  NA 138 141 141  K 4.7 4.0 4.2  CL 105  --   --   CO2 25  --   --   GLUCOSE 135*  --   --   BUN 13  --   --   CREATININE 0.43*  --   --   CALCIUM 10.3  --   --    GFR: Estimated Creatinine Clearance: 72.6 mL/min (A) (by C-G formula based on SCr of 0.43 mg/dL (L)). Liver Function Tests: No results for input(s): AST, ALT, ALKPHOS, BILITOT, PROT, ALBUMIN in the last 168 hours. No results for input(s): LIPASE, AMYLASE in the last 168 hours. No results for input(s): AMMONIA in the last 168 hours. Coagulation Profile: No results for input(s): INR, PROTIME in the last 168 hours. Cardiac Enzymes: No results for input(s): CKTOTAL, CKMB, CKMBINDEX, TROPONINI in the last 168 hours. BNP (last 3 results) No results for input(s): PROBNP in the last 8760 hours. HbA1C: No results  for input(s): HGBA1C in the last 72 hours. CBG: Recent Labs  Lab 10/29/20 2302 10/30/20 0304 10/30/20 0701 10/30/20 1058 10/30/20 1514  GLUCAP 104* 119* 113* 123* 115*   Lipid Profile: No results for input(s): CHOL, HDL, LDLCALC, TRIG, CHOLHDL, LDLDIRECT in the last 72 hours. Thyroid Function Tests: No results for input(s): TSH, T4TOTAL, FREET4, T3FREE, THYROIDAB in the last 72 hours. Anemia Panel: No results for input(s): VITAMINB12, FOLATE, FERRITIN, TIBC, IRON, RETICCTPCT in the last 72 hours. Urine analysis:    Component Value Date/Time   COLORURINE YELLOW 10/22/2020 1659   APPEARANCEUR HAZY (A) 10/22/2020 1659   LABSPEC 1.018 10/22/2020 1659   PHURINE 8.0 10/22/2020 1659   GLUCOSEU NEGATIVE 10/22/2020 1659   HGBUR NEGATIVE 10/22/2020 1659   BILIRUBINUR NEGATIVE 10/22/2020 1659   KETONESUR 5 (A) 10/22/2020 1659   PROTEINUR NEGATIVE 10/22/2020 1659   UROBILINOGEN 0.2 07/03/2014 1456   NITRITE NEGATIVE 10/22/2020 1659   LEUKOCYTESUR NEGATIVE 10/22/2020 1659   Sepsis  Labs: @LABRCNTIP (procalcitonin:4,lacticacidven:4)  ) Recent Results (from the past 240 hour(s))  Culture, blood (Routine X 2) w Reflex to ID Panel     Status: Abnormal   Collection Time: 10/23/20 12:26 PM   Specimen: BLOOD LEFT HAND  Result Value Ref Range Status   Specimen Description BLOOD LEFT HAND  Final   Special Requests   Final    BOTTLES DRAWN AEROBIC AND ANAEROBIC Blood Culture adequate volume   Culture  Setup Time (A)  Final    GRAM VARIABLE ROD ANAEROBIC BOTTLE ONLY CRITICAL RESULT CALLED TO, READ BACK BY AND VERIFIED WITH: PHARMD J Iran [id number] AT 1339 BY CM    Culture (A)  Final    BACILLUS SPECIES Standardized susceptibility testing for this organism is not available. Performed at [facility], [place] [address], [place], [place] [address]    Report Status 10/26/2020 FINAL  Final  Blood Culture ID Panel (Reflexed)     Status: None   Collection Time: 10/23/20 12:26 PM  Result Value Ref Range Status   Enterococcus faecalis NOT DETECTED NOT DETECTED Final   Enterococcus Faecium NOT DETECTED NOT DETECTED Final   Listeria monocytogenes NOT DETECTED NOT DETECTED Final   Staphylococcus species NOT DETECTED NOT DETECTED Final   Staphylococcus aureus (BCID) NOT DETECTED NOT DETECTED Final   Staphylococcus epidermidis NOT DETECTED NOT DETECTED Final   Staphylococcus lugdunensis NOT DETECTED NOT DETECTED Final   Streptococcus species NOT DETECTED NOT DETECTED Final   Streptococcus agalactiae NOT DETECTED NOT DETECTED Final   Streptococcus pneumoniae NOT DETECTED NOT DETECTED Final   Streptococcus pyogenes NOT DETECTED NOT DETECTED Final   A.calcoaceticus-baumannii NOT DETECTED NOT DETECTED Final   Bacteroides fragilis NOT DETECTED NOT DETECTED Final   Enterobacterales NOT DETECTED NOT DETECTED Final   Enterobacter cloacae complex NOT DETECTED NOT DETECTED Final   Escherichia coli NOT DETECTED NOT DETECTED Final   Klebsiella aerogenes NOT DETECTED NOT DETECTED Final    Klebsiella oxytoca NOT DETECTED NOT DETECTED Final   Klebsiella pneumoniae NOT DETECTED NOT DETECTED Final   Proteus species NOT DETECTED NOT DETECTED Final   Salmonella species NOT DETECTED NOT DETECTED Final   Serratia marcescens NOT DETECTED NOT DETECTED Final   Haemophilus influenzae NOT DETECTED NOT DETECTED Final   Neisseria meningitidis NOT DETECTED NOT DETECTED Final   Pseudomonas aeruginosa NOT DETECTED NOT DETECTED Final   Stenotrophomonas maltophilia NOT DETECTED NOT DETECTED Final   Candida albicans NOT DETECTED NOT DETECTED Final   Candida auris NOT DETECTED NOT DETECTED  Final   Candida glabrata NOT DETECTED NOT DETECTED Final   Candida krusei NOT DETECTED NOT DETECTED Final   Candida parapsilosis NOT DETECTED NOT DETECTED Final   Candida tropicalis NOT DETECTED NOT DETECTED Final   Cryptococcus neoformans/gattii NOT DETECTED NOT DETECTED Final    Comment: Performed at [facility], [place] [address], [place], [place] [address]  Culture, blood (Routine X 2) w Reflex to ID Panel     Status: None   Collection Time: 10/23/20  2:04 PM   Specimen: BLOOD  Result Value Ref Range Status   Specimen Description BLOOD FOOT  Final   Special Requests   Final    BOTTLES DRAWN AEROBIC AND ANAEROBIC Blood Culture adequate volume   Culture   Final    NO GROWTH 5 DAYS Performed at [facility], [place] [address], [place], [place] [address]    Report Status 10/28/2020 FINAL  Final         Radiology Studies: DG CHEST PORT 1 VIEW  Result Date: 10/30/2020 CLINICAL DATA:  Respiratory distress. EXAM: PORTABLE CHEST 1 VIEW COMPARISON:  10/27/2020 and CT chest 12/16/2019. FINDINGS: Heart size is accentuated by AP semi upright technique. Minimal subsegmental volume loss in the left lower lobe. Lungs are otherwise clear. No definite pleural fluid. IMPRESSION: No acute findings. Electronically Signed   By: Lorin Picket M.D.   On: 10/30/2020 10:12        Scheduled Meds:   arformoterol  15 mcg Nebulization BID   baclofen  5 mg Per Tube TID   budesonide  0.5 mg Nebulization BID   chlorhexidine gluconate (MEDLINE KIT)  15 mL Mouth Rinse BID   Chlorhexidine Gluconate Cloth  6 each Topical Daily   clonazePAM  0.5 mg Per Tube BID   clopidogrel  75 mg Per Tube Daily   enoxaparin (LOVENOX) injection  0.5 mg/kg Subcutaneous Q24H   famotidine  20 mg Per Tube BID   feeding supplement (PROSource TF)  45 mL Per Tube BID   fiber  1 packet Per Tube BID   Gerhardt's butt cream   Topical BID   guaiFENesin  5 mL Per Tube Q6H   hydrocortisone cream   Topical TID   insulin aspart  0-15 Units Subcutaneous Q4H   insulin aspart  5 Units Subcutaneous Q4H   insulin glargine  40 Units Subcutaneous QHS   levETIRAcetam  2,500 mg Per Tube BID   loratadine  10 mg Per Tube Daily   mouth rinse  15 mL Mouth Rinse 10 times per day   metoprolol tartrate  12.5 mg Per Tube BID   montelukast  10 mg Per Tube Daily   nutrition supplement (JUVEN)  1 packet Per Tube BID   revefenacin  175 mcg Nebulization Daily   tiZANidine  2 mg Per Tube QHS   valproic acid  750 mg Per Tube Q12H   Continuous Infusions:  sodium chloride     feeding supplement (JEVITY 1.5 CAL/FIBER) 1,000 mL (10/30/20 0332)     LOS: 29 days    Time spent: 25  minutes    [name], MD [facility] 10/30/2020, 4:03 PM     Please page though [place] or Epic secure chat:  For [employer], Adult nurse

## 2020-10-30 NOTE — Progress Notes (Signed)
Trach changed to #8 Bivona with air cuff without difficutly.  Bilateral breath sounds noted and good volume return on ventilator.  Peak pressures are now 19 -- 20 cmH20.

## 2020-10-30 NOTE — Progress Notes (Signed)
RT called to pt room by RN. Pt had increased peak pressures into the 60s, rr rate increased to the 50s. Bloody secretions, RT suctioned mucous plug like bloody secretions. Pt stabilized and inner cannula changed. RT will cont to monitor.

## 2020-10-30 NOTE — Progress Notes (Signed)
PCCM INTERVAL PROGRESS NOTE   Vent issues (stacking, high peak pressures, accessory muscle use) seem to resolve with repositioning of her head and trach, which would indicate her current tracheostomy is less than ideal. We will attempt to find the appropriate trach for her, which I imagine will significantly improve her ventilatory issues.    Joneen Roach, AGACNP-BC [facility]  See Amion for personal pager PCCM on call pager [phone] until 7pm. Please call [place] 7p-7a. [phone]  10/30/2020 12:16 PM

## 2020-10-30 NOTE — Progress Notes (Signed)
Pt failed weaning attempt with PSV of both 10 and 15 cmH20.  Pt had marked accessory muscle use and respiratory rate greater than 40 bpm.  O2 saturation decreased to 84% within one minute and required hyperoxygenation with 100% o2 for 2 minutes to return saturations to normal.  Weaning trial was discontinued after less than 2 minutes.

## 2020-10-30 NOTE — TOC Progression Note (Signed)
Transition of Care [facility]) - Progression Note    Patient Details  Name: Sonia Drake MRN: [id number] Date of Birth: [birth year]  [facility] CM/SW Contact  Carley Hammed, [place] Phone Number: 10/30/2020, 2:30 PM  Clinical Narrative:    MD asked CSW if an LTACH placement were possible at this time. Pt currently does not qualify for LTACH placement as she is custodial and the insurance will not cover it. TOC will continue to follow for an available Vent SNF bed.   Expected Discharge Plan: Skilled Nursing Facility Barriers to Discharge: No SNF bed  Expected Discharge Plan and Services Expected Discharge Plan: Skilled Nursing Facility   Discharge Planning Services: CM Consult                                           Social Determinants of Health (SDOH) Interventions    Readmission Risk Interventions No flowsheet data found.

## 2020-10-30 NOTE — Progress Notes (Signed)
Pt unable to be ventilated through full support or PS on vent due to PIP of 60 terminating volumes at 50-116mL. Rhythmic breathing, pt continuously bearing down. Pt manually bagged for 30 minutes while RN sedated pt. MD at bedside during episode, this is the 3rd occurrence with this RRT in 3 days. Pt settled back enough to be placed back on full support. Vitals stable.

## 2020-10-30 NOTE — Progress Notes (Signed)
NAME:  Sonia Drake, MRN:  [id number], DOB:  1955/10/13, LOS: 29 ADMISSION DATE:  10/31/20, CONSULTATION DATE:  6/15 REFERRING MD:  Thedore Mins, CHIEF COMPLAINT:  Dyspnea   History of Present Illness:  65 y/o female with anoxic brain injury, tracheostomy status who was admitted from a SNF on 6/12 with worsening respiratory failure.  She had previously been ventilator dependent but had been weaned form the trach.  On 6/16 a bronchoscopy revealed dynamic collapse of her trachea, tracheostomy was replaced.  Later required mechanical ventilation.   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 No distress. WBC ct trending down. No events overnight growing GPC clusters in two of two cultures  6/16 increased work of breathing, upper airway noise, abdominal muscle use.  Bronchoscopy performed as trach exchange cuffless #8 Shiley XLT showed dynamic airway collapse, tracheal collapse. 6/16 continued respiratory distress moved to ICU, changed to cuffed #6 Shiley XLT, placed on MV with some improved comfort 6/12 cefepime > 6/14, 6/18 6/12 vanc > 6/14, 6/18  6/23 mucus plugging, severe vent dyssynchrony 6/24-25 intermittent jerking, seizures on EEG 6/27 weaned off propofol gtt  6/28 briefly tolerated trach collar in AM 6/29 tolerated trach collar in AM; no further seizure like activity; working with CM/SW for vent-SNF 7/1 Weaning on CPAP/ PS 10/5, CM SW working on [employer], NO seizure activity, secretions are not an issue per nursing 7/11 remains unable  to wean ATC for any meaningful amount of time. Remains on PRVC.    Interim History / Subjective:  Continues to air trap. Dyssynchronous, double stacking. Improved with vent trigger adjustment. Episode first thing this morning with high peak pressure alarm improved with suctioning of copious thick/plug secretions.   Objective   Blood pressure (!) 128/95, pulse 90, temperature (!) 97.5 F (36.4 C), temperature source Oral, resp. rate (!) 26, height 5\' 2"  (1.575 m), weight 91 kg, SpO2 97 %.    Vent Mode: PRVC FiO2 (%):  [21 %] 21 % Set Rate:  [20 bmp] 20 bmp Vt Set:  [400 mL] 400 mL PEEP:  [5 cmH20] 5 cmH20 Plateau Pressure:  [10 cmH20-20 cmH20] 10 cmH20   Intake/Output Summary (Last 24 hours) at 10/30/2020 0728 Last data filed at 10/30/2020 12/31/2020 Gross per 24 hour  Intake 1390 ml  Output 950 ml  Net 440 ml    Filed Weights   10/28/20 0500 10/29/20 0500 10/30/20 0323  Weight: 89.4 kg 91.1 kg 91 kg    Examination: General:  Middle aged female on vent in NAD Neuro:  PERRL, blinks with stimulation.  HEENT:  [place]/AT, No JVD noted, PERRL Cardiovascular:  RRR, no MRG Lungs:  expiratory wheeze R>L Abdomen:  Soft, non-distended, non-tender.  Musculoskeletal:  No acute deformity Skin:  Intact, MMM  Studies:  VQ scan 6/15> Neg for acute PE CT Head wo Contrast 6/24 > No definite acute intracranial abnormality, progressive cerebral atrophy w/chronic small vessel ischemic disease, acute L sphenoid sinusitis MRI Brain wo Contrast 6/28>  advanced atrophy, old Lt occipital infarct  Resolved Hospital Problem list   Circulatory shock, Hemoptysis  Assessment & Plan:   Acute on  chronic hypoxic/hypercapnic respiratory failure in setting of anoxic encephalopathy, tracheobronchomalacia, and severe COPD. - pressure support to trach collar as tolerated - goal SpO2 > 92% - routine trach care - continue brovana, pulmicort, yupelri, singulair - prn albuterol - Fluid balance is consistently  positive, may want to consider diuresis.  - Seeming less likely she will be a candidate for discharge to SNF in the near future, may be a good candidate for LTACH.   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 Seizure disorder. Hypertension. DM type 2. Hx of CVA. - per primary team   Labs:   CMP Latest Ref Rng & Units 10/27/2020 10/26/2020 10/24/2020  Glucose 70 - 99 mg/dL - - 443(X)  BUN 8 - 23 mg/dL - - 13  Creatinine 5.40 - 1.00 mg/dL - - 0.86(P)  Sodium 619 - 145 mmol/L 141 141 138  Potassium 3.5 - 5.1 mmol/L 4.2 4.0 4.7  Chloride 98 - 111 mmol/L - - 105  CO2 22 - 32 mmol/L - - 25  Calcium 8.9 - 10.3 mg/dL - - 50.9  Total Protein 6.5 - 8.1 g/dL - - -  Total Bilirubin 0.3 - 1.2 mg/dL - - -  Alkaline Phos 38 - 126 U/L - - -  AST 15 - 41 U/L - - -  ALT 0 - 44 U/L - - -    CBC Latest Ref Rng & Units 10/27/2020 10/26/2020 10/24/2020  WBC 4.0 - 10.5 K/uL - - 9.2  Hemoglobin 12.0 - 15.0 g/dL 10.5(L) 9.9(L) 10.2(L)  Hematocrit 36.0 - 46.0 % 31.0(L) 29.0(L) 31.9(L)  Platelets 150 - 400 K/uL - - 247    ABG    Component Value Date/Time   PHART 7.388 10/27/2020 0356   PCO2ART 50.4 (H) 10/27/2020 0356   PO2ART 127 (H) 10/27/2020 0356   HCO3 30.5 (H) 10/27/2020 0356   TCO2 32 10/27/2020 0356   O2SAT 99.0 10/27/2020 0356    CBG (last 3)  Recent Labs    10/29/20 2302 10/30/20 0304 10/30/20 0701  GLUCAP 104* 119* 113*     Signature:    Joneen Roach, AGACNP-BC [facility]  See Amion for personal pager PCCM on call pager [phone] until 7pm. Please call Elink 7p-7a. [phone]  10/30/2020 7:50 AM

## 2020-10-31 DIAGNOSIS — J449 Chronic obstructive pulmonary disease, unspecified: Secondary | ICD-10-CM | POA: Diagnosis not present

## 2020-10-31 DIAGNOSIS — J9621 Acute and chronic respiratory failure with hypoxia: Secondary | ICD-10-CM | POA: Diagnosis not present

## 2020-10-31 DIAGNOSIS — A419 Sepsis, unspecified organism: Secondary | ICD-10-CM | POA: Diagnosis not present

## 2020-10-31 DIAGNOSIS — I5032 Chronic diastolic (congestive) heart failure: Secondary | ICD-10-CM | POA: Diagnosis not present

## 2020-10-31 LAB — GLUCOSE, CAPILLARY
Glucose-Capillary: 112 mg/dL — ABNORMAL HIGH (ref 70–99)
Glucose-Capillary: 127 mg/dL — ABNORMAL HIGH (ref 70–99)
Glucose-Capillary: 115 mg/dL — ABNORMAL HIGH (ref 70–99)
Glucose-Capillary: 126 mg/dL — ABNORMAL HIGH (ref 70–99)
Glucose-Capillary: 122 mg/dL — ABNORMAL HIGH (ref 70–99)
Glucose-Capillary: 125 mg/dL — ABNORMAL HIGH (ref 70–99)

## 2020-10-31 NOTE — TOC Progression Note (Addendum)
Transition of Care [facility]) - Progression Note    Patient Details  Name: KELCI PETRELLA MRN: [id number] Date of Birth: [birth year]  [facility]) CM/SW Contact  Ralene Bathe, LCSWA Phone Number: 10/31/2020, 11:30 AM  Clinical Narrative:    11:30-  CSW called [facility] [phone] to inquire about whether they had any beds available at this time.  (They declined before due to no bed availability.)  There was no answer.  CSW left a message with Devonne Doughty in admissions requesting a returned call.    11:47-  CSW spoke with Marlane Hatcher at [facility].  Ms. Freida Busman requested that another referral be sent to the facility for review.  12:30-  Referral sent to [facility]  13:25-  CSW received a request to send insurance information to [facility].  Information sent.  14:45- CSW received a call from [facility].  CSW was informed that the patient could come back to the facility if she is weaned off of the vent.   Expected Discharge Plan: Skilled Nursing Facility Barriers to Discharge: No SNF bed  Expected Discharge Plan and Services Expected Discharge Plan: Skilled Nursing Facility   Discharge Planning Services: CM Consult                                           Social Determinants of Health (SDOH) Interventions    Readmission Risk Interventions No flowsheet data found.

## 2020-10-31 NOTE — Progress Notes (Signed)
PROGRESS NOTE    Sonia Drake   WFU:[id number]  DOB: [birth year]  PCP: Garwin Brothers, MD    DOA: 10/05/2020 LOS: 30   Assessment & Plan   Principal Problem:   Sepsis ([place]) Active Problems:   COPD (chronic obstructive pulmonary disease) ([place])   Chronic diastolic CHF (congestive heart failure) (HCC)   Acute on chronic respiratory failure with hypoxia (HCC)   GERD (gastroesophageal reflux disease)   Seizures ([place])   Type 2 diabetes mellitus without complication, with long-term current use of insulin (HCC)   Severe hypoxic-ischemic encephalopathy   Tracheostomy dependence ([place])   Pressure injury of skin   Chronic respiratory failure due to anoxic brain injury, dynamic airway collapse (due to tracheobronchomalacia) and advanced COPD 7/12 previously unable to wean to trach collar, another attempt today underway -Continue full mechanical ventilation - Consult critical care for ventilator assistance - Continue Brovana and Pulmicort     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 - Continue Lantus and Novolog + SSI   Hypertension - currently with soft BP's - Continue metoprolol with hold parameters   Advanced COPD FEV1 2019 was 38% No wheezing - not exacerbated at this time -Continue LABA/LAMA, ICS - Continue Singulair, Claritin, famotidine  Cerebrovascular disease, secondary prevention -Continue Plavix   Obesity BMI 36   Resolved issues: Positive blood cultures Blood cultures on admission growing actually 3 different species of coag negative staph, appear to  all be contaminants.   Hypokalemia Resolved    Obesity: Body mass index is 37.94 kg/m.  Complicates overall care and prognosis.  Recommend lifestyle modifications including physical activity and diet for weight loss and overall long-term health.   DVT prophylaxis:    Diet:  Diet Orders (From admission, onward)     Start     Ordered   10/07/2020 1610  Diet NPO time specified  Diet effective now        10/15/2020 1610              Code Status: Full Code   Brief Narrative / Hospital Course to Date:   Sonia Drake is a 65 y.o. F with DM, COPD, seizures, and anoxic brain injury due to PEA cardiac arrest Aug 2021 at [facility] now in with persistent vegetative state (opens eyes but does not follow commands), tracheostomy dependent who presented with respiratory distress from SNF.  6/12 admitted from skilled nursing facility after reported aspiration event, copious secretions, and respiratory distress; working diagnosis was sepsis from CXR-negative aspiration pneumonia and abx were started 6/13 appeared comfortable on 35% ATC. No distress. WBC ct trending down.  Blood cultures growing 2 different CoNS, likely contaminant 6/14 abx stopped, no further fever 6/16 increased work of breathing, upper airway noise, abdominal muscle use.  Bronchoscopy performed as trach exchange cuffless #8 Shiley XLT showed dynamic airway collapse, tracheal collapse. VQ scan 6/15> Neg for acute PE 6/16 continued respiratory distress moved to ICU, changed to cuffed #6 Shiley XLT, placed on MV with some improved comfort Vanc restarted 6/18 to 6/19, then stopped, no further fever, WBC normalized off abx.  6/23 mucus plugging, severe vent dyssynchrony 6/24-25 intermittent jerking, seizures on EEG propofol restarted 6/27 weaned off propofol gtt 6/28 briefly tolerated trach collar in AM 6/29 tolerated trach collar in AM; no further seizure like activity; working with CM/SW for vent-SNF 7/1 Weaning on CPAP/ PS 10/5,  CM SW working on [employer], NO seizure activity, secretions are not an issue per nursing 7/1 - 7/5 has remained stable 7/5-7/11 has consistently failed to wean to trach collar  Subjective 10/31/20    Pt seen with bedside RN on rounds this AM.  No fevers.  Currently on trial with trach collar, off vent, just recently started.     Disposition Plan & Communication   Status is: Inpatient  Remains inpatient appropriate because:Inpatient level of care appropriate due to severity of illness.  Requires LTAC or vent-capable SNF.  Dispo: The patient is from: SNF              Anticipated d/c is to: LTAC vs SNF-vent              Patient currently is not medically stable to d/c.   Difficult to place patient Yes   Consults, Procedures, Significant Events   Consultants:  PCCM  Procedures:  As above, significant events  Antimicrobials:  Anti-infectives (From admission, onward)    Start     Dose/Rate Route Frequency Ordered Stop   10/07/20 1400  vancomycin (VANCOREADY) IVPB 1500 mg/300 mL  Status:  Discontinued        1,500 mg 150 mL/hr over 120 Minutes Intravenous Every 24 hours 10/07/20 1223 10/09/20 0829   10/07/20 1200  ceFEPIme (MAXIPIME) 2 g in sodium chloride 0.9 % 100 mL IVPB  Status:  Discontinued        2 g 200 mL/hr over 30 Minutes Intravenous Every 8 hours 10/07/20 0828 10/07/20 1221   10/02/20 1400  vancomycin (VANCOREADY) IVPB 1250 mg/250 mL  Status:  Discontinued        1,250 mg 166.7 mL/hr over 90 Minutes Intravenous Every 24 hours 10/14/2020 1313 10/04/20 1221   09/20/2020 2200  ceFEPIme (MAXIPIME) 2 g in sodium chloride 0.9 % 100 mL IVPB  Status:  Discontinued        2 g 200 mL/hr over 30 Minutes Intravenous Every 8 hours 10/08/2020 1313 10/04/20 1221   10/07/2020 2200  metroNIDAZOLE (FLAGYL) IVPB 500 mg  Status:  Discontinued        500 mg 100 mL/hr over 60 Minutes Intravenous Every 8 hours 09/23/2020 1616 10/04/20 1221   10/04/2020 1330  vancomycin (VANCOREADY) IVPB 2000 mg/400  mL        2,000 mg 200 mL/hr over 120 Minutes Intravenous  Once 10/04/2020 1257 10/16/2020 1550   10/04/2020 1300  ceFEPIme (MAXIPIME) 2 g in sodium chloride 0.9 % 100 mL IVPB        2 g 200 mL/hr over 30 Minutes Intravenous  Once 10/13/2020 1257 10/17/2020 1330   10/13/2020 1300  metroNIDAZOLE (FLAGYL) IVPB 500 mg        500 mg 100 mL/hr over 60 Minutes Intravenous  Once 09/28/2020 1257 09/29/2020 1443         Micro    Objective   Vitals:   10/31/20 1038 10/31/20 1100 10/31/20 1156 10/31/20 1200  BP:  115/75  104/76  Pulse: 91 89  86  Resp: (!) 25 (!) 26  (!) 24  Temp:   98 F (36.7 C)   TempSrc:   Axillary   SpO2:  96%  97%  Weight:      Height:        Intake/Output Summary (Last 24 hours) at 10/31/2020 1306 Last data filed at 10/31/2020 1135 Gross per 24 hour  Intake 720 ml  Output 875 ml  Net -155 ml   Filed Weights   10/29/20 0500 10/30/20 0323 10/31/20 0353  Weight: 91.1 kg 91 kg 94.1 kg    Physical Exam:  General exam: unresponsive, no acute distress, obese HEENT: trach in place, moist mucus membranes, hearing grossly normal  Respiratory system: diffuse rhonchi, no wheezes, on trach collar trial. Cardiovascular system: normal S1/S2, RRR.   Gastrointestinal system: soft, NT, ND, G tube present Central nervous system: unable to evalluate, appears vegetative state Extremities: b/l UE edema, trace LE edema Skin: dry, intact, normal temperature   Labs   Data Reviewed: I have personally reviewed following labs and imaging studies  CBC: Recent Labs  Lab 10/26/20 0431 10/27/20 0356  HGB 9.9* 10.5*  HCT 29.0* 09.9*   Basic Metabolic Panel: Recent Labs  Lab 10/26/20 0431 10/27/20 0356  NA 141 141  K 4.0 4.2   GFR: Estimated Creatinine Clearance: 73.9 mL/min (A) (by C-G formula based on SCr of 0.43 mg/dL (L)). Liver Function Tests: No results for input(s): AST, ALT, ALKPHOS, BILITOT, PROT, ALBUMIN in the last 168 hours. No results for input(s): LIPASE, AMYLASE  in the last 168 hours. No results for input(s): AMMONIA in the last 168 hours. Coagulation Profile: No results for input(s): INR, PROTIME in the last 168 hours. Cardiac Enzymes: No results for input(s): CKTOTAL, CKMB, CKMBINDEX, TROPONINI in the last 168 hours. BNP (last 3 results) No results for input(s): PROBNP in the last 8760 hours. HbA1C: No results for input(s): HGBA1C in the last 72 hours. CBG: Recent Labs  Lab 10/30/20 1902 10/30/20 2302 10/31/20 0303 10/31/20 0702 10/31/20 1105  GLUCAP 127* 102* 112* 127* 115*   Lipid Profile: No results for input(s): CHOL, HDL, LDLCALC, TRIG, CHOLHDL, LDLDIRECT in the last 72 hours. Thyroid Function Tests: No results for input(s): TSH, T4TOTAL, FREET4, T3FREE, THYROIDAB in the last 72 hours. Anemia Panel: No results for input(s): VITAMINB12, FOLATE, FERRITIN, TIBC, IRON, RETICCTPCT in the last 72 hours. Sepsis Labs: No results for input(s): PROCALCITON, LATICACIDVEN in the last 168 hours.  Recent Results (from the past 240 hour(s))  Culture, blood (Routine X 2) w Reflex to ID Panel     Status: Abnormal   Collection Time: 10/23/20 12:26 PM   Specimen: BLOOD LEFT HAND  Result Value Ref Range Status   Specimen Description BLOOD LEFT HAND  Final   Special Requests   Final    BOTTLES DRAWN AEROBIC AND ANAEROBIC Blood Culture adequate volume   Culture  Setup Time (A)  Final    GRAM VARIABLE ROD ANAEROBIC BOTTLE ONLY CRITICAL RESULT CALLED TO, READ BACK BY AND VERIFIED WITH: PHARMD J Iran [id number] AT 1339 BY CM    Culture (A)  Final    BACILLUS SPECIES Standardized susceptibility testing for this organism is not available. Performed at [facility], [place] [address] [place], [place] [address]    Report Status 10/26/2020 FINAL  Final  Blood Culture ID Panel (Reflexed)     Status: None   Collection Time: 10/23/20 12:26 PM  Result Value Ref Range Status   Enterococcus faecalis NOT DETECTED NOT DETECTED Final   Enterococcus  Faecium NOT DETECTED NOT DETECTED Final   Listeria monocytogenes NOT DETECTED NOT DETECTED Final   Staphylococcus species NOT  DETECTED NOT DETECTED Final   Staphylococcus aureus (BCID) NOT DETECTED NOT DETECTED Final   Staphylococcus epidermidis NOT DETECTED NOT DETECTED Final   Staphylococcus lugdunensis NOT DETECTED NOT DETECTED Final   Streptococcus species NOT DETECTED NOT DETECTED Final   Streptococcus agalactiae NOT DETECTED NOT DETECTED Final   Streptococcus pneumoniae NOT DETECTED NOT DETECTED Final   Streptococcus pyogenes NOT DETECTED NOT DETECTED Final   A.calcoaceticus-baumannii NOT DETECTED NOT DETECTED Final   Bacteroides fragilis NOT DETECTED NOT DETECTED Final   Enterobacterales NOT DETECTED NOT DETECTED Final   Enterobacter cloacae complex NOT DETECTED NOT DETECTED Final   Escherichia coli NOT DETECTED NOT DETECTED Final   Klebsiella aerogenes NOT DETECTED NOT DETECTED Final   Klebsiella oxytoca NOT DETECTED NOT DETECTED Final   Klebsiella pneumoniae NOT DETECTED NOT DETECTED Final   Proteus species NOT DETECTED NOT DETECTED Final   Salmonella species NOT DETECTED NOT DETECTED Final   Serratia marcescens NOT DETECTED NOT DETECTED Final   Haemophilus influenzae NOT DETECTED NOT DETECTED Final   Neisseria meningitidis NOT DETECTED NOT DETECTED Final   Pseudomonas aeruginosa NOT DETECTED NOT DETECTED Final   Stenotrophomonas maltophilia NOT DETECTED NOT DETECTED Final   Candida albicans NOT DETECTED NOT DETECTED Final   Candida auris NOT DETECTED NOT DETECTED Final   Candida glabrata NOT DETECTED NOT DETECTED Final   Candida krusei NOT DETECTED NOT DETECTED Final   Candida parapsilosis NOT DETECTED NOT DETECTED Final   Candida tropicalis NOT DETECTED NOT DETECTED Final   Cryptococcus neoformans/gattii NOT DETECTED NOT DETECTED Final    Comment: Performed at [facility], [address] [place], [place] [address]  Culture, blood (Routine X 2) w Reflex to ID Panel      Status: None   Collection Time: 10/23/20  2:04 PM   Specimen: BLOOD  Result Value Ref Range Status   Specimen Description BLOOD FOOT  Final   Special Requests   Final    BOTTLES DRAWN AEROBIC AND ANAEROBIC Blood Culture adequate volume   Culture   Final    NO GROWTH 5 DAYS Performed at [facility], [place] [address], [place], [place] [address]    Report Status 10/28/2020 FINAL  Final      Imaging Studies   DG CHEST PORT 1 VIEW  Result Date: 10/30/2020 CLINICAL DATA:  Respirator dependent, evaluate tracheostomy EXAM: PORTABLE CHEST 1 VIEW COMPARISON:  Radiograph 10/30/2008 FINDINGS: Tracheostomy apparatus tip terminates at the level of the lower trachea, 2.1 cm from the carina. Streaky basilar opacities favor atelectasis. No consolidative process is seen. No pneumothorax or effusion. Stable cardiomediastinal contours accounting for differences in technique. No acute osseous or soft tissue abnormality. Degenerative changes are present in the imaged spine and shoulders. IMPRESSION: Tracheostomy tip at the level of the lower trachea, 2.1 cm from the carina. Streaky basilar opacities favoring atelectasis. Lungs are otherwise clear. Electronically Signed   By: Lovena Le M.D.   On: 10/30/2020 21:20   DG CHEST PORT 1 VIEW  Result Date: 10/30/2020 CLINICAL DATA:  Respiratory distress. EXAM: PORTABLE CHEST 1 VIEW COMPARISON:  10/27/2020 and CT chest 12/16/2019. FINDINGS: Heart size is accentuated by AP semi upright technique. Minimal subsegmental volume loss in the left lower lobe. Lungs are otherwise clear. No definite pleural fluid. IMPRESSION: No acute findings. Electronically Signed   By: Lorin Picket M.D.   On: 10/30/2020 10:12     Medications   Scheduled Meds:  arformoterol  15 mcg Nebulization BID   baclofen  5 mg Per Tube TID   budesonide  0.5 mg Nebulization BID   chlorhexidine gluconate (MEDLINE KIT)  15 mL Mouth Rinse BID   Chlorhexidine Gluconate Cloth  6 each Topical  Daily   clonazePAM  0.5 mg Per Tube BID   clopidogrel  75 mg Per Tube Daily   enoxaparin (LOVENOX) injection  0.5 mg/kg Subcutaneous Q24H   famotidine  20 mg Per Tube BID   feeding supplement (PROSource TF)  45 mL Per Tube BID   fiber  1 packet Per Tube BID   Gerhardt's butt cream   Topical BID   guaiFENesin  5 mL Per Tube Q6H   hydrocortisone cream   Topical TID   insulin aspart  0-15 Units Subcutaneous Q4H   insulin aspart  5 Units Subcutaneous Q4H   insulin glargine  40 Units Subcutaneous QHS   levETIRAcetam  2,500 mg Per Tube BID   loratadine  10 mg Per Tube Daily   mouth rinse  15 mL Mouth Rinse 10 times per day   metoprolol tartrate  12.5 mg Per Tube BID   montelukast  10 mg Per Tube Daily   nutrition supplement (JUVEN)  1 packet Per Tube BID   revefenacin  175 mcg Nebulization Daily   tiZANidine  2 mg Per Tube QHS   valproic acid  750 mg Per Tube Q12H   Continuous Infusions:  sodium chloride     feeding supplement (JEVITY 1.5 CAL/FIBER) 1,000 mL (10/31/20 0139)       LOS: 30 days    Time spent: 30 minutes    Ezekiel Slocumb, DO [facility]  10/31/2020, 1:06 PM      If 7PM-7AM, please contact night-coverage. How to contact the [facility] Attending or Consulting provider [place] or covering provider during after hours [place], for this patient?    Check the care team in [facility] and look for a) attending/consulting TR[facility] provider listed and b) the [facility] team listed Log into [email or web address] and use [facility]'s universal password to access. If you do not have the password, please contact the hospital operator. Locate the [facility] provider you are looking for under [facility] and page to a number that you can be directly reached. If you still have difficulty reaching the provider, please page the [facility] (Director on Call) for the Hospitalists listed on amion for assistance.

## 2020-10-31 NOTE — Progress Notes (Signed)
NAME:  Sonia Drake, MRN:  [id number], DOB:  04-Oct-1955, LOS: 30 ADMISSION DATE:  30-Oct-2020, CONSULTATION DATE:  6/15 REFERRING MD:  Thedore Mins, CHIEF COMPLAINT:  Dyspnea   History of Present Illness:  65 y/o female with anoxic brain injury, tracheostomy status who was admitted from a SNF on 6/12 with worsening respiratory failure.  She had previously been ventilator dependent but had been weaned form the trach.  On 6/16 a bronchoscopy revealed dynamic collapse of her trachea, tracheostomy was replaced.  Later required mechanical ventilation.   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 No distress. WBC ct trending down. No events overnight growing GPC clusters in two of two cultures  6/16 increased work of breathing, upper airway noise, abdominal muscle use.  Bronchoscopy performed as trach exchange cuffless #8 Shiley XLT showed dynamic airway collapse, tracheal collapse. 6/16 continued respiratory distress moved to ICU, changed to cuffed #6 Shiley XLT, placed on MV with some improved comfort 6/12 cefepime > 6/14, 6/18 6/12 vanc > 6/14, 6/18  6/23 mucus plugging, severe vent dyssynchrony 6/24-25 intermittent jerking, seizures on EEG 6/27 weaned off propofol gtt  6/28 briefly tolerated trach collar in AM 6/29 tolerated trach collar in AM; no further seizure like activity; working with CM/SW for vent-SNF 7/1 Weaning on CPAP/ PS 10/5, CM SW working on [employer], NO seizure activity, secretions are not an issue per nursing 7/11 trach changed  to #8 Bivona with air cuff, much improved. 7/12 Weaning 8/5   Interim History / Subjective:  Much improved after change to Bivona trach. Weaning 8/5 this morning. No acute events overnight.   Objective   Blood pressure (!) 143/93, pulse 98, temperature 98.7 F (37.1 C), temperature source Oral, resp. rate (!) 25, height 5\' 2"  (1.575 m), weight 94.1 kg, SpO2 95 %.    Vent Mode: PSV;CPAP FiO2 (%):  [21 %] 21 % Set Rate:  [20 bmp] 20 bmp Vt Set:  [400 mL] 400 mL PEEP:  [5 cmH20] 5 cmH20 Plateau Pressure:  [19 cmH20-21 cmH20] 19 cmH20   Intake/Output Summary (Last 24 hours) at 10/31/2020 0807 Last data filed at 10/31/2020 0600 Gross per 24 hour  Intake 1215 ml  Output 750 ml  Net 465 ml    Filed Weights   10/29/20 0500 10/30/20 0323 10/31/20 0353  Weight: 91.1 kg 91 kg 94.1 kg    Examination: General:  elderly appearing female on vent via trach Neuro:  Unresponsive. Blinks with stimulation.  HEENT:  [place]/AT, No JVD noted, PERRL Cardiovascular:  RRR, no MRG Lungs:  Clear bilateral breath sounds Abdomen:  Soft, non-distended, non-tender.  Musculoskeletal:  No acute deformity  Skin:  Intact, MMM  Studies:  VQ scan 6/15> Neg for acute PE CT Head wo Contrast 6/24 > No definite acute intracranial abnormality, progressive cerebral atrophy w/chronic small vessel ischemic disease, acute L sphenoid sinusitis MRI Brain wo Contrast 6/28>  advanced atrophy, old Lt occipital infarct  Resolved Hospital Problem list   Circulatory shock Hemoptysis  Assessment & Plan:   Acute on chronic hypoxic/hypercapnic respiratory failure in setting of anoxic encephalopathy, tracheobronchomalacia, and severe COPD. - pressure  support to trach collar as tolerated - goal SpO2 > 92% - routine trach care - continue brovana, pulmicort, yupelri, singulair - prn albuterol - Fluid balance is consistently positive, may want to consider diuresis.  - Not clear at this point whether she will require full time  vent or will be able to tolerate trach collar. She looks good after trach change will try her on collar today.  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 Seizure disorder. Hypertension. DM type 2. Hx of CVA. - per primary team   Labs:   CMP Latest Ref Rng & Units 10/27/2020 10/26/2020 10/24/2020  Glucose 70 - 99 mg/dL - - 102(V)  BUN 8 - 23 mg/dL - - 13  Creatinine 2.53 - 1.00 mg/dL - - 6.64(Q)  Sodium 034 - 145 mmol/L 141 141 138  Potassium 3.5 - 5.1 mmol/L 4.2 4.0 4.7  Chloride 98 - 111 mmol/L - - 105  CO2 22 - 32 mmol/L - - 25  Calcium 8.9 - 10.3 mg/dL - - 74.2  Total Protein 6.5 - 8.1 g/dL - - -  Total Bilirubin 0.3 - 1.2 mg/dL - - -  Alkaline Phos 38 - 126 U/L - - -  AST 15 - 41 U/L - - -  ALT 0 - 44 U/L - - -    CBC Latest Ref Rng & Units 10/27/2020 10/26/2020 10/24/2020  WBC 4.0 - 10.5 K/uL - - 9.2  Hemoglobin 12.0 - 15.0 g/dL 10.5(L) 9.9(L) 10.2(L)  Hematocrit 36.0 - 46.0 % 31.0(L) 29.0(L) 31.9(L)  Platelets 150 - 400 K/uL - - 247    ABG    Component Value Date/Time   PHART 7.388 10/27/2020 0356   PCO2ART 50.4 (H) 10/27/2020 0356   PO2ART 127 (H) 10/27/2020 0356   HCO3 30.5 (H) 10/27/2020 0356   TCO2 32 10/27/2020 0356   O2SAT 99.0 10/27/2020 0356    CBG (last 3)  Recent Labs    10/30/20 2302 10/31/20 0303 10/31/20 0702  GLUCAP 102* 112* 127*     Signature:    Joneen Roach, AGACNP-BC [facility]  See Amion for personal pager PCCM on call pager [phone] until 7pm. Please call Elink 7p-7a. [phone]  10/31/2020 8:07 AM

## 2020-10-31 NOTE — [place] FL2 (Addendum)
[place] MEDICAID FL2 LEVEL OF CARE SCREENING TOOL     IDENTIFICATION  Patient Name: Sonia Drake Birthdate: 02-17-1956 Sex: female Admission Date (Current Location): 09/22/2020  [facility] and [place] Number:  Herbalist and Address:  [facility] [place], [place] [address] [place], [place] [address]      Provider Number: [id number]  Attending Physician Name and Address:  Ezekiel Slocumb, DO  Relative Name and Phone Number:  Jonell Brumbaugh (Daughter) [phone]    Current Level of Care: Hospital Recommended Level of Care: Vent SNF (refer to d/c summary) Prior Approval Number:    Date Approved/Denied:   PASRR Number: [id number]  Discharge Plan: SNF    Current Diagnoses: Patient Active Problem List   Diagnosis Date Noted   Pressure injury of skin 10/02/2020   Sepsis ([place]) 10/11/2020   Type 2 diabetes mellitus without complication, with long-term current use of insulin ([place]) 10/02/2020   Tracheostomy dependence ([place]) 10/12/2020   Myoclonus 12/25/2019   Severe hypoxic-ischemic encephalopathy 12/25/2019   COPD with acute exacerbation ([place]) 08/28/2019   Chest pain 08/28/2019   Acute and chronic respiratory failure with hypercapnia ([place]) 01/27/2019   Asthma with COPD with exacerbation ([place]) 01/26/2019   GERD (gastroesophageal reflux disease) 02/16/2018   Seizures ([place]) 02/16/2018   Bronchitis 11/11/2017   Acute on chronic respiratory failure with hypoxia ([place]) 11/03/2017   Respiratory distress    Chronic diastolic CHF (congestive heart failure) ([place]) 09/19/2017   Primary osteoarthritis of left hip 04/07/2017   Osteoarthritis of left hip 04/02/2017   COPD (chronic obstructive pulmonary disease) ([place]) 02/04/2016   Lung nodule 02/04/2016   Asthma exacerbation with COPD (chronic obstructive pulmonary disease) ([place]) 07/21/2015   Influenza B 04/23/2015   Asthma exacerbation 04/22/2015   Hypokalemia 04/22/2015   Sick-euthyroid syndrome  07/03/2014   Influenza with pneumonia 07/03/2014   Multiple lung nodules on CT 07/03/2014   Asthma 07/02/2014   Hyperglycemia 07/02/2014   Chronic obstructive pulmonary disease with acute exacerbation (HCC)     Orientation RESPIRATION BLADDER Height & Weight      (Unresponsive)  Vent, Tracheostomy, O2 External catheter Weight: 207 lb 7.3 oz (94.1 kg) Height:  5' 2"  (157.5 cm)  BEHAVIORAL SYMPTOMS/MOOD NEUROLOGICAL BOWEL NUTRITION STATUS      Incontinent Diet, Feeding tube (refer to d/c summary)  AMBULATORY STATUS COMMUNICATION OF NEEDS Skin   Total Care Does not communicate (unresponsive) Skin abrasions                       Personal Care Assistance Level of Assistance  Bathing, Feeding, Dressing, Total care Bathing Assistance: Maximum assistance Feeding assistance: Maximum assistance Dressing Assistance: Maximum assistance Total Care Assistance: Maximum assistance   Functional Limitations Info   (unable to assess , pt with trach)          [place]  PT (By licensed PT), OT (By licensed OT)     PT Frequency: 5x/week , evaluate and treat OT Frequency: 5x/week , evaluate and treat            Contractures Contractures Info: Present (R AND L UPPER EXTREMITY)    Additional Factors Info  Code Status, Allergies Code Status Info: Full Code Allergies Info: Contrast Media (Iodinated Diagnostic Agents), Peanuts (Peanut Oil), Shellfish Allergy, Soap, Latex, Adenosine, Eggs Or Egg-derived Products           Current Medications (10/31/2020):  This is the current hospital active medication list Current  Facility-Administered Medications  Medication Dose Route Frequency Provider Last Rate Last Admin   0.9 %  sodium chloride infusion  250 mL Intravenous Continuous Elmer Sow, MD       acetaminophen (TYLENOL) tablet 650 mg  650 mg Per Tube Q4H PRN Hosie Poisson, MD   650 mg at 10/22/20 1652   albuterol (PROVENTIL) (2.5 MG/3ML) 0.083% nebulizer  solution 2.5 mg  2.5 mg Nebulization Q2H PRN Noemi Chapel P, DO   2.5 mg at 10/23/20 1730   arformoterol (BROVANA) nebulizer solution 15 mcg  15 mcg Nebulization BID Simonne Maffucci B, MD   15 mcg at 10/31/20 0704   baclofen (OZOBAX) 1 mg/mL oral solution 5 mg  5 mg Per Tube TID Samella Parr, NP   5 mg at 10/31/20 0915   budesonide (PULMICORT) nebulizer solution 0.5 mg  0.5 mg Nebulization BID Orma Flaming, MD   0.5 mg at 10/31/20 0704   chlorhexidine gluconate (MEDLINE KIT) (PERIDEX) 0.12 % solution 15 mL  15 mL Mouth Rinse BID Candee Furbish, MD   15 mL at 10/31/20 0757   Chlorhexidine Gluconate Cloth 2 % PADS 6 each  6 each Topical Daily Julian Hy, DO   6 each at 10/30/20 1317   clonazePAM (KLONOPIN) tablet 0.5 mg  0.5 mg Per Tube BID Hosie Poisson, MD   0.5 mg at 10/31/20 0915   clopidogrel (PLAVIX) tablet 75 mg  75 mg Per Tube Daily Orma Flaming, MD   75 mg at 10/31/20 0915   enoxaparin (LOVENOX) injection 45 mg  0.5 mg/kg Subcutaneous Q24H Joselyn Glassman A, RPH   45 mg at 10/30/20 2136   famotidine (PEPCID) tablet 20 mg  20 mg Per Tube BID Orma Flaming, MD   20 mg at 10/31/20 0916   feeding supplement (JEVITY 1.5 CAL/FIBER) liquid 1,000 mL  1,000 mL Per Tube Continuous Thurnell Lose, MD 45 mL/hr at 10/31/20 0139 1,000 mL at 10/31/20 0139   feeding supplement (PROSource TF) liquid 45 mL  45 mL Per Tube BID Thurnell Lose, MD   45 mL at 10/31/20 0915   fentaNYL (SUBLIMAZE) injection 25-100 mcg  25-100 mcg Intravenous Q30 min PRN Audria Nine, DO   100 mcg at 10/30/20 1334   fiber (NUTRISOURCE FIBER) 1 packet  1 packet Per Tube BID Samella Parr, NP   1 packet at 10/31/20 0915   Gerhardt's butt cream   Topical BID Hosie Poisson, MD   Given at 10/31/20 0932   guaiFENesin (ROBITUSSIN) 100 MG/5ML solution 100 mg  5 mL Per Tube Q6H Enzo Bi, MD   100 mg at 10/31/20 4270   hydrocortisone cream 1 %   Topical TID Hosie Poisson, MD   Given at 10/31/20 0930   insulin  aspart (novoLOG) injection 0-15 Units  0-15 Units Subcutaneous Q4H Skipper Cliche A, MD   2 Units at 10/31/20 0751   insulin aspart (novoLOG) injection 5 Units  5 Units Subcutaneous Q4H Enzo Bi, MD   5 Units at 10/31/20 1135   insulin glargine (LANTUS) injection 40 Units  40 Units Subcutaneous QHS Orma Flaming, MD   40 Units at 10/30/20 2134   levETIRAcetam (KEPPRA) 100 MG/ML solution 2,500 mg  2,500 mg Per Tube BID Orma Flaming, MD   2,500 mg at 10/31/20 0918   loratadine (CLARITIN) tablet 10 mg  10 mg Per Tube Daily Noemi Chapel P, DO   10 mg at 10/31/20 0915   LORazepam (ATIVAN) injection 2-4 mg  2-4 mg Intravenous Q1H PRN Candee Furbish, MD   2 mg at 10/30/20 5670   MEDLINE mouth rinse  15 mL Mouth Rinse 10 times per day Candee Furbish, MD   15 mL at 10/31/20 1202   metoprolol tartrate (LOPRESSOR) tablet 12.5 mg  12.5 mg Per Tube BID Debbe Odea, MD   12.5 mg at 10/30/20 2135   montelukast (SINGULAIR) tablet 10 mg  10 mg Per Tube Daily Orma Flaming, MD   10 mg at 10/31/20 0915   nutrition supplement (JUVEN) (JUVEN) powder packet 1 packet  1 packet Per Tube BID Debbe Odea, MD   1 packet at 10/31/20 0915   polyethylene glycol (MIRALAX / GLYCOLAX) packet 17 g  17 g Per Tube Daily PRN Harvie Heck, MD   17 g at 10/18/20 0912   revefenacin (YUPELRI) nebulizer solution 175 mcg  175 mcg Nebulization Daily Simonne Maffucci B, MD   175 mcg at 10/31/20 0704   senna-docusate (Senokot-S) tablet 1 tablet  1 tablet Per Tube QHS PRN Aslam, Loralyn Freshwater, MD       tiZANidine (ZANAFLEX) tablet 2 mg  2 mg Per Tube QHS Samella Parr, NP   2 mg at 10/30/20 2135   valproic acid (DEPAKENE) 250 MG/5ML solution 750 mg  750 mg Per Tube Q12H Hosie Poisson, MD   750 mg at 10/31/20 1410     Discharge Medications: Please see discharge summary for a list of discharge medications.  Relevant Imaging Results:  Relevant Lab Results:   Additional Information [id number]  Paulene Floor Neftaly Inzunza, LCSWA

## 2020-11-01 DIAGNOSIS — J9621 Acute and chronic respiratory failure with hypoxia: Secondary | ICD-10-CM | POA: Diagnosis not present

## 2020-11-01 LAB — GLUCOSE, CAPILLARY
Glucose-Capillary: 111 mg/dL — ABNORMAL HIGH (ref 70–99)
Glucose-Capillary: 114 mg/dL — ABNORMAL HIGH (ref 70–99)
Glucose-Capillary: 115 mg/dL — ABNORMAL HIGH (ref 70–99)
Glucose-Capillary: 117 mg/dL — ABNORMAL HIGH (ref 70–99)
Glucose-Capillary: 122 mg/dL — ABNORMAL HIGH (ref 70–99)
Glucose-Capillary: 124 mg/dL — ABNORMAL HIGH (ref 70–99)

## 2020-11-01 NOTE — Progress Notes (Signed)
NAME:  Sonia Drake, MRN:  [id number], DOB:  1956/04/15, LOS: 31 ADMISSION DATE:  09/23/2020, CONSULTATION DATE:  6/15 REFERRING MD:  Thedore Mins, CHIEF COMPLAINT:  Dyspnea   History of Present Illness:  65 y/o female with anoxic brain injury, tracheostomy status who was admitted from a SNF on 6/12 with worsening respiratory failure.  She had previously been ventilator dependent but had been weaned from the trach.  On 6/16 a bronchoscopy revealed dynamic collapse of her trachea, tracheostomy was replaced.  Later required mechanical ventilation.   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 No distress. WBC ct trending down. No events overnight growing GPC clusters in two of two cultures  6/16 increased work of breathing, upper airway noise, abdominal muscle use.  Bronchoscopy performed as trach exchange cuffless #8 Shiley XLT showed dynamic airway collapse, tracheal collapse. 6/16 continued respiratory distress moved to ICU, changed to cuffed #6 Shiley XLT, placed on MV with some improved comfort 6/12 cefepime > 6/14, 6/18 6/12 vanc > 6/14, 6/18  6/23 mucus plugging, severe vent dyssynchrony 6/24-25 intermittent jerking, seizures on EEG 6/27 weaned off propofol gtt  6/28 briefly tolerated trach collar in AM 6/29 tolerated trach collar in AM; no further seizure like activity; working with CM/SW for vent-SNF 7/1 Weaning on CPAP/ PS 10/5, CM SW working on [employer], NO seizure activity, secretions are not an issue per nursing 7/11 trach changed  to #8 Bivona with air cuff, much improved. 7/12 TCT 12 hours; rested on Vent overnight 7/13 on TCT   Interim History / Subjective:  Patient on TCT 28% this morning doing well: sats 97%; RR 22 Patient did TCT 8am-9pm yesterday UOP -865 last 24 hours Afebrile  Much improved after change to Bivona trach. Weaning 8/5 this morning. No acute events overnight.   Objective   Blood pressure (!) 152/94, pulse (!) 114, temperature 98.8 F (37.1 C), temperature source Axillary, resp. rate (!) 28, height 5\' 2"  (1.575 m), weight 90.7 kg, SpO2 95 %.    Vent Mode: PRVC FiO2 (%):  [21 %-28 %] 28 % Set Rate:  [20 bmp] 20 bmp Vt Set:  [400 mL] 400 mL PEEP:  [5 cmH20] 5 cmH20 Plateau Pressure:  [17 cmH20-21 cmH20] 17 cmH20   Intake/Output Summary (Last 24 hours) at 11/01/2020 0902 Last data filed at 11/01/2020 0600 Gross per 24 hour  Intake 1175 ml  Output 865 ml  Net 310 ml    Filed Weights   10/30/20 0323 10/31/20 0353 11/01/20 0324  Weight: 91 kg 94.1 kg 90.7 kg    Examination: General:  elderly femal on Vent via trach HEENT: MM pink/moist; PERRL Neuro: unresponsvie CV: s1s2, RRR, no m/r/g PULM:  dim clear bs bilaterally; on TCT; bivona in place GI: soft, bsx4 active  Skin: no rashes or lesions  ABG 7.38, 50.4, 127, 30.5 No recent BMP, CBC   Studies:  VQ scan 6/15> Neg for acute PE CT Head wo Contrast 6/24 > No definite acute intracranial abnormality, progressive cerebral atrophy w/chronic small vessel ischemic disease, acute L sphenoid sinusitis MRI Brain wo Contrast 6/28>  advanced atrophy, old Lt  occipital infarct  Resolved Hospital Problem list   Circulatory shock Hemoptysis  Assessment & Plan:   Acute on chronic hypoxic/hypercapnic respiratory failure in setting of anoxic encephalopathy, tracheobronchomalacia, and severe COPD. 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 Seizure disorder. Hypertension. DM type 2. Hx of CVA. P: - per primary team   Labs:   CMP Latest Ref Rng & Units 10/27/2020 10/26/2020 10/24/2020  Glucose 70 - 99 mg/dL - - 540(G)  BUN 8 - 23 mg/dL - - 13  Creatinine 8.67 - 1.00 mg/dL - - 6.19(J)  Sodium 093 - 145 mmol/L 141 141 138  Potassium 3.5 - 5.1 mmol/L 4.2 4.0 4.7  Chloride 98 - 111 mmol/L - - 105  CO2 22 - 32 mmol/L - - 25  Calcium 8.9 - 10.3 mg/dL - - 26.7  Total Protein 6.5 - 8.1 g/dL - - -  Total Bilirubin 0.3 - 1.2 mg/dL - - -  Alkaline Phos 38 - 126 U/L - - -  AST 15 - 41 U/L - - -  ALT 0 - 44 U/L - - -    CBC Latest Ref Rng & Units 10/27/2020 10/26/2020 10/24/2020  WBC 4.0 - 10.5 K/uL - - 9.2  Hemoglobin 12.0 - 15.0 g/dL 10.5(L) 9.9(L) 10.2(L)  Hematocrit 36.0 - 46.0 % 31.0(L) 29.0(L) 31.9(L)  Platelets 150 - 400 K/uL - - 247    ABG    Component Value Date/Time   PHART 7.388 10/27/2020 0356   PCO2ART 50.4 (H) 10/27/2020 0356   PO2ART 127 (H) 10/27/2020 0356   HCO3 30.5 (H) 10/27/2020 0356   TCO2 32 10/27/2020 0356   O2SAT 99.0 10/27/2020 0356    CBG (last 3)  Recent Labs    10/31/20 2316 11/01/20 0300 11/01/20 0705  GLUCAP 125* 115* 117*     Signature:   Critical care time 35 minutes  JD Anselm Lis [facility] 11/01/2020, 9:25 AM  Please see [email or web address] for pager details.  From 7A-7P if no response, please call [phone]. After hours, please call ELink [phone].

## 2020-11-01 NOTE — Progress Notes (Signed)
Pt transported to 25M without incident.

## 2020-11-01 NOTE — Progress Notes (Signed)
Nutrition Follow-up  DOCUMENTATION CODES:   Not applicable  INTERVENTION:   Continue TF via PEG: Jevity 1.5 at 45 ml/h (1080 ml per day) ProSource TF 45 ml BID  Provides 1700 kcal, 91 gm protein, 821 ml free water daily.  Continue Juven BID, via PEG, each packet provides 80 calories, 8 grams of carbohydrate, 2.5  grams of protein (collagen), 7 grams of L-arginine and 7 grams of L-glutamine; supplement contains CaHMB, Vitamins C, E, B12 and Zinc to promote wound healing.  NUTRITION DIAGNOSIS:   Increased nutrient needs related to wound healing as evidenced by estimated needs.  Ongoing   GOAL:   Patient will meet greater than or equal to 90% of their needs  Met with TF  MONITOR:   Vent status, Labs, Weight trends, TF tolerance, Skin, I & O's  REASON FOR ASSESSMENT:   Consult Enteral/tube feeding initiation and management  ASSESSMENT:   Sonia Drake is a 65 y.o. female with medical history significant of asthma/COPD, CHF, cardiac arrest with PEA/CPR with chronic respiratory failure on permanent trach with supplemental oxygen at 6L, type 2 DM, GERD, seizures and severe hypoxic-ischemic encephalopathy who presented to ER via EMS for respiratory distress from SNF.  Discussed patient in ICU rounds and with RN today. S/P trach exchange 7/11.  Receiving Jevity 1.5 at 45 ml/h with ProSource TF 45 ml BID via PEG. Tolerating well to meet nutrition needs. Also receiving Juven BID to aid in wound healing.  Patient remains intubated on ventilator support via trach. MV: 9.2 L/min Temp (24hrs), Avg:98.1 F (36.7 C), Min:97.5 F (36.4 C), Max:98.8 F (37.1 C)   Labs reviewed.  CBG: 115-117 this AM  Medications reviewed and include pepcid, nutrisource fiber 1 packet BID, Novolog 5 units q 4 hours for TF coverage, Novolog SSI q 4 hours, Lantus, Juven.   Admission weight 88.4 kg Current weight 90.7 kg I/O +20.6 L since admission  Diet Order:   Diet Order              Diet NPO time specified  Diet effective now                   EDUCATION NEEDS:   No education needs have been identified at this time  Skin:  Skin Assessment: Skin Integrity Issues: Skin Integrity Issues:: Other (Comment) Stage II: buttocks Other: MASD to buttocks  Last BM:  7/13 type 7  Height:   Ht Readings from Last 1 Encounters:  10/12/20 _0  (1.575 m)    Weight:   Wt Readings from Last 1 Encounters:  11/01/20 90.7 kg    Ideal Body Weight:  45.5 kg  BMI:  Body mass index is 36.57 kg/m.  Estimated Nutritional Needs:   Kcal:  1600-1800  Protein:  90-105 grams  Fluid:  > 1.6 L    Lucas Mallow, RD, LDN, CNSC Please refer to Amion for contact information.

## 2020-11-01 NOTE — Progress Notes (Signed)
PROGRESS NOTE    Sonia Drake   SHF:[id number]  DOB: Feb 19, 1956  PCP: Sonia Brothers, MD    DOA: 10/09/2020 LOS: 31   Assessment & Plan   Principal Problem:   Sepsis ([place]) Active Problems:   COPD (chronic obstructive pulmonary disease) (HCC)   Chronic diastolic CHF (congestive heart failure) (HCC)   Acute on chronic respiratory failure with hypoxia (HCC)   GERD (gastroesophageal reflux disease)   Seizures ([place])   Type 2 diabetes mellitus without complication, with long-term current use of insulin (HCC)   Severe hypoxic-ischemic encephalopathy   Tracheostomy dependence ([place])   Pressure injury of skin   Chronic respiratory failure due to anoxic brain injury, dynamic airway collapse (due to tracheobronchomalacia) and advanced COPD 7/12 previously unable to wean to trach collar, another attempt today underway --PCCM following - see their recs -Continue full mechanical ventilation --Trach collar trials per pulmonary - Continue Brovana, Pulmicort, Singulair, Yupelri --maintain sats >92%    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 - Continue Lantus and Novolog + SSI   Hypertension - currently with soft BP's - Continue metoprolol with hold parameters   Advanced COPD FEV1 2019 was 38% No wheezing - not exacerbated at this time -Continue LABA/LAMA, ICS - Continue Singulair, Claritin, famotidine  Cerebrovascular disease, secondary prevention -Continue Plavix   Obesity BMI 36   Resolved issues: Positive blood cultures Blood  cultures on admission growing actually 3 different species of coag negative staph, appear to all be contaminants.   Hypokalemia Resolved    Obesity: Body mass index is 36.57 kg/m.  Complicates overall care and prognosis.  Recommend lifestyle modifications including physical activity and diet for weight loss and overall long-term health.   DVT prophylaxis:    Diet:  Diet Orders (From admission, onward)     Start     Ordered   09/23/2020 1610  Diet NPO time specified  Diet effective now        10/19/2020 1610              Code Status: Full Code   Brief Narrative / Hospital Course to Date:   Sonia Drake is a 65 y.o. F with DM, COPD, seizures, and anoxic brain injury due to PEA cardiac arrest Aug 2021 at [facility] now in with persistent vegetative state (opens eyes but does not follow commands), tracheostomy dependent who presented with respiratory distress from SNF.  6/12 admitted from skilled nursing facility after reported aspiration event, copious secretions, and respiratory distress; working diagnosis was sepsis from CXR-negative aspiration pneumonia and abx were started 6/13 appeared comfortable on 35% ATC. No distress. WBC ct trending down.  Blood cultures growing 2 different CoNS, likely contaminant 6/14 abx stopped, no further fever 6/16 increased work of breathing, upper airway noise, abdominal muscle use.  Bronchoscopy performed as trach exchange cuffless #8 [place] XLT showed dynamic airway collapse, tracheal collapse. VQ scan 6/15> Neg for acute PE 6/16 continued respiratory distress moved to ICU, changed to cuffed #6 Shiley XLT, placed on MV with some improved comfort Vanc restarted 6/18 to 6/19, then  stopped, no further fever, WBC normalized off abx.    6/23 mucus plugging, severe vent dyssynchrony 6/24-25 intermittent jerking, seizures on EEG propofol restarted 6/27 weaned off propofol gtt 6/28 briefly tolerated trach collar in AM 6/29 tolerated trach collar in AM; no  further seizure like activity; working with CM/SW for vent-SNF 7/1 Weaning on CPAP/ PS 10/5, CM SW working on [employer], NO seizure activity, secretions are not an issue per nursing 7/1 - 7/5 has remained stable 7/5-7/11 has consistently failed to wean to trach collar  Subjective 11/01/20    Pt seen this AM.  Appears doing okay on trach collar today.  Was on vent overnight.  No acute events reported.   Disposition Plan & Communication   Status is: Inpatient  Remains inpatient appropriate because:Inpatient level of care appropriate due to severity of illness.  Requires LTAC or vent-capable SNF.  Dispo: The patient is from: SNF              Anticipated d/c is to: LTAC vs SNF-vent              Patient currently is not medically stable to d/c.   Difficult to place patient Yes   Consults, Procedures, Significant Events   Consultants:  PCCM  Procedures:  As above, significant events  Antimicrobials:  Anti-infectives (From admission, onward)    Start     Dose/Rate Route Frequency Ordered Stop   10/07/20 1400  vancomycin (VANCOREADY) IVPB 1500 mg/300 mL  Status:  Discontinued        1,500 mg 150 mL/hr over 120 Minutes Intravenous Every 24 hours 10/07/20 1223 10/09/20 0829   10/07/20 1200  ceFEPIme (MAXIPIME) 2 g in sodium chloride 0.9 % 100 mL IVPB  Status:  Discontinued        2 g 200 mL/hr over 30 Minutes Intravenous Every 8 hours 10/07/20 0828 10/07/20 1221   10/02/20 1400  vancomycin (VANCOREADY) IVPB 1250 mg/250 mL  Status:  Discontinued        1,250 mg 166.7 mL/hr over 90 Minutes Intravenous Every 24 hours 10/07/2020 1313 10/04/20 1221   10/14/2020 2200  ceFEPIme (MAXIPIME) 2 g in sodium chloride 0.9 % 100 mL IVPB  Status:  Discontinued        2 g 200 mL/hr over 30 Minutes Intravenous Every 8 hours 10/04/2020 1313 10/04/20 1221   10/13/2020 2200  metroNIDAZOLE (FLAGYL) IVPB 500 mg  Status:  Discontinued        500 mg 100 mL/hr over 60 Minutes Intravenous Every 8 hours 10/06/2020  1616 10/04/20 1221   10/15/2020 1330  vancomycin (VANCOREADY) IVPB 2000 mg/400 mL        2,000 mg 200 mL/hr over 120 Minutes Intravenous  Once 09/25/2020 1257 09/23/2020 1550   10/15/2020 1300  ceFEPIme (MAXIPIME) 2 g in sodium chloride 0.9 % 100 mL IVPB        2 g 200 mL/hr over 30 Minutes Intravenous  Once 09/30/2020 1257 10/18/2020 1330   10/09/2020 1300  metroNIDAZOLE (FLAGYL) IVPB 500 mg        500 mg 100 mL/hr over 60 Minutes Intravenous  Once 10/06/2020 1257 10/10/2020 1443         Micro    Objective   Vitals:   11/01/20 1059 11/01/20 1100 11/01/20 1102 11/01/20 1200  BP:  129/88  122/83  Pulse: 95 94  95  Resp: (!) 26   (!) 22  Temp:   98.2 F (36.8 C)   TempSrc:  Axillary   SpO2: 97%   96%  Weight:      Height:        Intake/Output Summary (Last 24 hours) at 11/01/2020 1330 Last data filed at 11/01/2020 1208 Gross per 24 hour  Intake 995 ml  Output 991 ml  Net 4 ml   Filed Weights   10/30/20 0323 10/31/20 0353 11/01/20 0324  Weight: 91 kg 94.1 kg 90.7 kg    Physical Exam:  General exam: unresponsive, no acute distress, obese HEENT: trach in place, moist mucus membranes, hearing grossly normal  Respiratory system: trach collar on 6 L/min O2, , no wheezes, on trach collar trial. Cardiovascular system: normal S1/S2, tachycardic, regular rhythm Gastrointestinal system: soft, NT, ND, G tube present, tube feeds running Central nervous system: unable to evalluate, appears vegetative state Extremities: b/l UE edema, no LE edema    Labs   Data Reviewed: I have personally reviewed following labs and imaging studies  CBC: Recent Labs  Lab 10/26/20 0431 10/27/20 0356  HGB 9.9* 10.5*  HCT 29.0* 65.6*   Basic Metabolic Panel: Recent Labs  Lab 10/26/20 0431 10/27/20 0356  NA 141 141  K 4.0 4.2   GFR: Estimated Creatinine Clearance: 72.4 mL/min (A) (by C-G formula based on SCr of 0.43 mg/dL (L)). Liver Function Tests: No results for input(s): AST, ALT, ALKPHOS,  BILITOT, PROT, ALBUMIN in the last 168 hours. No results for input(s): LIPASE, AMYLASE in the last 168 hours. No results for input(s): AMMONIA in the last 168 hours. Coagulation Profile: No results for input(s): INR, PROTIME in the last 168 hours. Cardiac Enzymes: No results for input(s): CKTOTAL, CKMB, CKMBINDEX, TROPONINI in the last 168 hours. BNP (last 3 results) No results for input(s): PROBNP in the last 8760 hours. HbA1C: No results for input(s): HGBA1C in the last 72 hours. CBG: Recent Labs  Lab 10/31/20 1903 10/31/20 2316 11/01/20 0300 11/01/20 0705 11/01/20 1101  GLUCAP 122* 125* 115* 117* 124*   Lipid Profile: No results for input(s): CHOL, HDL, LDLCALC, TRIG, CHOLHDL, LDLDIRECT in the last 72 hours. Thyroid Function Tests: No results for input(s): TSH, T4TOTAL, FREET4, T3FREE, THYROIDAB in the last 72 hours. Anemia Panel: No results for input(s): VITAMINB12, FOLATE, FERRITIN, TIBC, IRON, RETICCTPCT in the last 72 hours. Sepsis Labs: No results for input(s): PROCALCITON, LATICACIDVEN in the last 168 hours.  Recent Results (from the past 240 hour(s))  Culture, blood (Routine X 2) w Reflex to ID Panel     Status: Abnormal   Collection Time: 10/23/20 12:26 PM   Specimen: BLOOD LEFT HAND  Result Value Ref Range Status   Specimen Description BLOOD LEFT HAND  Final   Special Requests   Final    BOTTLES DRAWN AEROBIC AND ANAEROBIC Blood Culture adequate volume   Culture  Setup Time (A)  Final    GRAM VARIABLE ROD ANAEROBIC BOTTLE ONLY CRITICAL RESULT CALLED TO, READ BACK BY AND VERIFIED WITH: PHARMD J [place] [id number] AT [id number] BY CM    Culture (A)  Final    BACILLUS SPECIES Standardized susceptibility testing for this organism is not available. Performed at [facility], [place] [address] [place], [place] [address]    Report Status 10/26/2020 FINAL  Final  Blood Culture ID Panel (Reflexed)     Status: None   Collection Time: 10/23/20 12:26 PM  Result Value Ref  Range Status   Enterococcus faecalis NOT DETECTED NOT DETECTED Final   Enterococcus Faecium NOT DETECTED NOT DETECTED Final   Listeria  monocytogenes NOT DETECTED NOT DETECTED Final   Staphylococcus species NOT DETECTED NOT DETECTED Final   Staphylococcus aureus (BCID) NOT DETECTED NOT DETECTED Final   Staphylococcus epidermidis NOT DETECTED NOT DETECTED Final   Staphylococcus lugdunensis NOT DETECTED NOT DETECTED Final   Streptococcus species NOT DETECTED NOT DETECTED Final   Streptococcus agalactiae NOT DETECTED NOT DETECTED Final   Streptococcus pneumoniae NOT DETECTED NOT DETECTED Final   Streptococcus pyogenes NOT DETECTED NOT DETECTED Final   A.calcoaceticus-baumannii NOT DETECTED NOT DETECTED Final   Bacteroides fragilis NOT DETECTED NOT DETECTED Final   Enterobacterales NOT DETECTED NOT DETECTED Final   Enterobacter cloacae complex NOT DETECTED NOT DETECTED Final   Escherichia coli NOT DETECTED NOT DETECTED Final   Klebsiella aerogenes NOT DETECTED NOT DETECTED Final   Klebsiella oxytoca NOT DETECTED NOT DETECTED Final   Klebsiella pneumoniae NOT DETECTED NOT DETECTED Final   Proteus species NOT DETECTED NOT DETECTED Final   Salmonella species NOT DETECTED NOT DETECTED Final   Serratia marcescens NOT DETECTED NOT DETECTED Final   Haemophilus influenzae NOT DETECTED NOT DETECTED Final   Neisseria meningitidis NOT DETECTED NOT DETECTED Final   Pseudomonas aeruginosa NOT DETECTED NOT DETECTED Final   Stenotrophomonas maltophilia NOT DETECTED NOT DETECTED Final   Candida albicans NOT DETECTED NOT DETECTED Final   Candida auris NOT DETECTED NOT DETECTED Final   Candida glabrata NOT DETECTED NOT DETECTED Final   Candida krusei NOT DETECTED NOT DETECTED Final   Candida parapsilosis NOT DETECTED NOT DETECTED Final   Candida tropicalis NOT DETECTED NOT DETECTED Final   Cryptococcus neoformans/gattii NOT DETECTED NOT DETECTED Final    Comment: Performed at [facility], [address] [place], [place] [address]  Culture, blood (Routine X 2) w Reflex to ID Panel     Status: None   Collection Time: 10/23/20  2:04 PM   Specimen: BLOOD  Result Value Ref Range Status   Specimen Description BLOOD FOOT  Final   Special Requests   Final    BOTTLES DRAWN AEROBIC AND ANAEROBIC Blood Culture adequate volume   Culture   Final    NO GROWTH 5 DAYS Performed at [facility], [place] [address], [place], [place] [address]    Report Status 10/28/2020 FINAL  Final      Imaging Studies   DG CHEST PORT 1 VIEW  Result Date: 10/30/2020 CLINICAL DATA:  Respirator dependent, evaluate tracheostomy EXAM: PORTABLE CHEST 1 VIEW COMPARISON:  Radiograph 10/30/2008 FINDINGS: Tracheostomy apparatus tip terminates at the level of the lower trachea, 2.1 cm from the carina. Streaky basilar opacities favor atelectasis. No consolidative process is seen. No pneumothorax or effusion. Stable cardiomediastinal contours accounting for differences in technique. No acute osseous or soft tissue abnormality. Degenerative changes are present in the imaged spine and shoulders. IMPRESSION: Tracheostomy tip at the level of the lower trachea, 2.1 cm from the carina. Streaky basilar opacities favoring atelectasis. Lungs are otherwise clear. Electronically Signed   By: Lovena Le M.D.   On: 10/30/2020 21:20     Medications   Scheduled Meds:  arformoterol  15 mcg Nebulization BID   baclofen  5 mg Per Tube TID   budesonide  0.5 mg Nebulization BID   chlorhexidine gluconate (MEDLINE KIT)  15 mL Mouth Rinse BID   Chlorhexidine Gluconate Cloth  6 each Topical Daily   clonazePAM  0.5 mg Per Tube BID   clopidogrel  75 mg Per Tube Daily   enoxaparin (LOVENOX) injection  0.5 mg/kg  Subcutaneous Q24H   famotidine  20 mg Per Tube BID   feeding supplement (PROSource TF)  45 mL Per Tube BID   fiber  1 packet Per Tube BID   Gerhardt's butt cream   Topical BID   guaiFENesin  5 mL Per Tube Q6H   hydrocortisone cream    Topical TID   insulin aspart  0-15 Units Subcutaneous Q4H   insulin aspart  5 Units Subcutaneous Q4H   insulin glargine  40 Units Subcutaneous QHS   levETIRAcetam  2,500 mg Per Tube BID   loratadine  10 mg Per Tube Daily   mouth rinse  15 mL Mouth Rinse 10 times per day   metoprolol tartrate  12.5 mg Per Tube BID   montelukast  10 mg Per Tube Daily   nutrition supplement (JUVEN)  1 packet Per Tube BID   revefenacin  175 mcg Nebulization Daily   tiZANidine  2 mg Per Tube QHS   valproic acid  750 mg Per Tube Q12H   Continuous Infusions:  sodium chloride     feeding supplement (JEVITY 1.5 CAL/FIBER) 1,000 mL (11/01/20 0225)       LOS: 31 days    Time spent: 25 minutes    Ezekiel Slocumb, DO [facility]  11/01/2020, 1:30 PM      If 7PM-7AM, please contact night-coverage. How to contact the [facility] Attending or Consulting provider [place] or covering provider during after hours [place], for this patient?    Check the care team in [facility] and look for a) attending/consulting TR[facility] provider listed and b) the [facility] team listed Log into [email or web address] and use [place]'s universal password to access. If you do not have the password, please contact the hospital operator. Locate the [facility] provider you are looking for under [facility] and page to a number that you can be directly reached. If you still have difficulty reaching the provider, please page the [facility] (Director on Call) for the Hospitalists listed on amion for assistance.

## 2020-11-02 DIAGNOSIS — Z930 Tracheostomy status: Secondary | ICD-10-CM | POA: Diagnosis not present

## 2020-11-02 DIAGNOSIS — J449 Chronic obstructive pulmonary disease, unspecified: Secondary | ICD-10-CM | POA: Diagnosis not present

## 2020-11-02 DIAGNOSIS — J9621 Acute and chronic respiratory failure with hypoxia: Secondary | ICD-10-CM | POA: Diagnosis not present

## 2020-11-02 DIAGNOSIS — I5032 Chronic diastolic (congestive) heart failure: Secondary | ICD-10-CM | POA: Diagnosis not present

## 2020-11-02 LAB — GLUCOSE, CAPILLARY
Glucose-Capillary: 103 mg/dL — ABNORMAL HIGH (ref 70–99)
Glucose-Capillary: 110 mg/dL — ABNORMAL HIGH (ref 70–99)
Glucose-Capillary: 120 mg/dL — ABNORMAL HIGH (ref 70–99)
Glucose-Capillary: 133 mg/dL — ABNORMAL HIGH (ref 70–99)
Glucose-Capillary: 137 mg/dL — ABNORMAL HIGH (ref 70–99)
Glucose-Capillary: 160 mg/dL — ABNORMAL HIGH (ref 70–99)

## 2020-11-02 NOTE — Progress Notes (Addendum)
Pt with increased work of breathing; copious thick white secretions noted when suctioned; increased work of breathing continues; RT notified; pt placed back on full support.

## 2020-11-02 NOTE — Progress Notes (Signed)
PROGRESS NOTE    Sonia Drake   ZLD:[id number]  DOB: [birth year]  PCP: Sonia Brothers, MD    DOA: 10/11/2020 LOS: 32   Assessment & Plan   Principal Problem:   Sepsis ([place]) Active Problems:   COPD (chronic obstructive pulmonary disease) (HCC)   Chronic diastolic CHF (congestive heart failure) (HCC)   Acute on chronic respiratory failure with hypoxia (HCC)   GERD (gastroesophageal reflux disease)   Seizures ([place])   Type 2 diabetes mellitus without complication, with long-term current use of insulin (HCC)   Severe hypoxic-ischemic encephalopathy   Tracheostomy dependence ([place])   Pressure injury of skin   Chronic respiratory failure due to anoxic brain injury, dynamic airway collapse (due to tracheobronchomalacia) and advanced COPD 7/12 previously unable to wean to trach collar, another attempt today underway --PCCM following - see their recs -Continue full mechanical ventilation --Trach collar trials per pulmonary - Continue Brovana, Pulmicort, Singulair, Yupelri --maintain sats >92%    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 - Continue Lantus and Novolog + SSI   Hypertension - currently with soft BP's - Continue metoprolol with hold parameters   Advanced COPD FEV1 2019 was 38% No wheezing - not exacerbated at this time -Continue LABA/LAMA, ICS - Continue Singulair, Claritin, famotidine  Cerebrovascular disease, secondary prevention -Continue Plavix   Obesity BMI 36   Resolved issues: Positive blood cultures Blood  cultures on admission growing actually 3 different species of coag negative staph, appear to all be contaminants.   Hypokalemia Resolved    Obesity: Body mass index is 36.57 kg/m.  Complicates overall care and prognosis.  Recommend lifestyle modifications including physical activity and diet for weight loss and overall long-term health.   DVT prophylaxis:    Diet:  Diet Orders (From admission, onward)     Start     Ordered   10/08/2020 1610  Diet NPO time specified  Diet effective now        09/20/2020 1610              Code Status: Full Code   Brief Narrative / Hospital Course to Date:   [name] is a 65 y.o. F with DM, COPD, seizures, and anoxic brain injury due to PEA cardiac arrest Aug 2021 at [facility] now in with persistent vegetative state (opens eyes but does not follow commands), tracheostomy dependent who presented with respiratory distress from SNF.  6/12 admitted from skilled nursing facility after reported aspiration event, copious secretions, and respiratory distress; working diagnosis was sepsis from CXR-negative aspiration pneumonia and abx were started 6/13 appeared comfortable on 35% ATC. No distress. WBC ct trending down.  Blood cultures growing 2 different CoNS, likely contaminant 6/14 abx stopped, no further fever 6/16 increased work of breathing, upper airway noise, abdominal muscle use.  Bronchoscopy performed as trach exchange cuffless #8 Shiley XLT showed dynamic airway collapse, tracheal collapse. VQ scan 6/15> Neg for acute PE 6/16 continued respiratory distress moved to ICU, changed to cuffed #6 Shiley XLT, placed on MV with some improved comfort Vanc restarted 6/18 to 6/19, then  stopped, no further fever, WBC normalized off abx.    6/23 mucus plugging, severe vent dyssynchrony 6/24-25 intermittent jerking, seizures on EEG propofol restarted 6/27 weaned off propofol gtt 6/28 briefly tolerated trach collar in AM 6/29 tolerated trach collar in AM; no  further seizure like activity; working with CM/SW for vent-SNF 7/1 Weaning on CPAP/ PS 10/5, CM SW working on [employer], NO seizure activity, secretions are not an issue per nursing 7/1 - 7/5 has remained stable 7/5-7/11 has consistently failed to wean to trach collar  Subjective 11/02/20    Pt seen this AM.  Eyes open but does not track or appear to interact at all.  No acute events reported.  Appears comfortable without respiratory distress at this time.    Disposition Plan & Communication   Status is: Inpatient  Remains inpatient appropriate because:Inpatient level of care appropriate due to severity of illness.  Requires LTAC or vent-capable SNF.  Dispo: The patient is from: SNF              Anticipated d/c is to: LTAC vs SNF-vent              Patient currently is not medically stable to d/c.   Difficult to place patient Yes   Consults, Procedures, Significant Events   Consultants:  PCCM  Procedures:  As above, significant events  Antimicrobials:  Anti-infectives (From admission, onward)    Start     Dose/Rate Route Frequency Ordered Stop   10/07/20 1400  vancomycin (VANCOREADY) IVPB 1500 mg/300 mL  Status:  Discontinued        1,500 mg 150 mL/hr over 120 Minutes Intravenous Every 24 hours 10/07/20 1223 10/09/20 0829   10/07/20 1200  ceFEPIme (MAXIPIME) 2 g in sodium chloride 0.9 % 100 mL IVPB  Status:  Discontinued        2 g 200 mL/hr over 30 Minutes Intravenous Every 8 hours 10/07/20 0828 10/07/20 1221   10/02/20 1400  vancomycin (VANCOREADY) IVPB 1250 mg/250 mL  Status:  Discontinued        1,250 mg 166.7 mL/hr over 90 Minutes Intravenous Every 24 hours 09/20/2020 1313 10/04/20 1221   09/24/2020 2200  ceFEPIme (MAXIPIME) 2 g in sodium chloride 0.9 % 100 mL IVPB  Status:  Discontinued        2 g 200 mL/hr over 30 Minutes Intravenous Every 8 hours 10/06/2020 1313 10/04/20 1221   10/06/2020 2200  metroNIDAZOLE (FLAGYL) IVPB 500 mg  Status:  Discontinued        500 mg 100  mL/hr over 60 Minutes Intravenous Every 8 hours 09/23/2020 1616 10/04/20 1221   09/29/2020 1330  vancomycin (VANCOREADY) IVPB 2000 mg/400 mL        2,000 mg 200 mL/hr over 120 Minutes Intravenous  Once 09/26/2020 1257 09/24/2020 1550   10/10/2020 1300  ceFEPIme (MAXIPIME) 2 g in sodium chloride 0.9 % 100 mL IVPB        2 g 200 mL/hr over 30 Minutes Intravenous  Once 09/29/2020 1257 09/30/2020 1330   09/30/2020 1300  metroNIDAZOLE (FLAGYL) IVPB 500 mg        500 mg 100 mL/hr over 60 Minutes Intravenous  Once 10/05/2020 1257 10/02/2020 1443         Micro    Objective   Vitals:   11/02/20 1400 11/02/20 1500 11/02/20 1518 11/02/20 1519  BP: 114/82 (!) 119/91    Pulse: 85 89    Resp: 20 20    Temp:  97.8 F (36.6 C)   TempSrc:   Oral   SpO2: 97% 97%  98%  Weight:      Height:        Intake/Output Summary (Last 24 hours) at 11/02/2020 1651 Last data filed at 11/02/2020 1400 Gross per 24 hour  Intake 1670 ml  Output 600 ml  Net 1070 ml   Filed Weights   10/31/20 0353 11/01/20 0324 11/02/20 0500  Weight: 94.1 kg 90.7 kg 90.7 kg    Physical Exam:  General exam: unresponsive, no acute distress, obese HEENT: eyes open today, trach in place Respiratory system: trach collar on 5 L/min O2, coarse upper airway secretion sounds. Cardiovascular system: normal S1/S2, tachycardic, regular rhythm Gastrointestinal system: soft, G tube present, TF's running Central nervous system: unable to evalluate, appears vegetative state Extremities: b/l hands and UE edema, no LE edema    Labs   Data Reviewed: I have personally reviewed following labs and imaging studies  CBC: Recent Labs  Lab 10/27/20 0356  HGB 10.5*  HCT 55.7*   Basic Metabolic Panel: Recent Labs  Lab 10/27/20 0356  NA 141  K 4.2   GFR: Estimated Creatinine Clearance: 72.4 mL/min (A) (by C-G formula based on SCr of 0.43 mg/dL (L)). Liver Function Tests: No results for input(s): AST, ALT, ALKPHOS, BILITOT, PROT, ALBUMIN in the  last 168 hours. No results for input(s): LIPASE, AMYLASE in the last 168 hours. No results for input(s): AMMONIA in the last 168 hours. Coagulation Profile: No results for input(s): INR, PROTIME in the last 168 hours. Cardiac Enzymes: No results for input(s): CKTOTAL, CKMB, CKMBINDEX, TROPONINI in the last 168 hours. BNP (last 3 results) No results for input(s): PROBNP in the last 8760 hours. HbA1C: No results for input(s): HGBA1C in the last 72 hours. CBG: Recent Labs  Lab 11/01/20 2337 11/02/20 0349 11/02/20 0800 11/02/20 1118 11/02/20 1516  GLUCAP 114* 103* 133* 110* 120*   Lipid Profile: No results for input(s): CHOL, HDL, LDLCALC, TRIG, CHOLHDL, LDLDIRECT in the last 72 hours. Thyroid Function Tests: No results for input(s): TSH, T4TOTAL, FREET4, T3FREE, THYROIDAB in the last 72 hours. Anemia Panel: No results for input(s): VITAMINB12, FOLATE, FERRITIN, TIBC, IRON, RETICCTPCT in the last 72 hours. Sepsis Labs: No results for input(s): PROCALCITON, LATICACIDVEN in the last 168 hours.  No results found for this or any previous visit (from the past 240 hour(s)).     Imaging Studies   No results found.   Medications   Scheduled Meds:  arformoterol  15 mcg Nebulization BID   baclofen  5 mg Per Tube TID   budesonide  0.5 mg Nebulization BID   chlorhexidine gluconate (MEDLINE KIT)  15 mL Mouth Rinse BID   Chlorhexidine Gluconate Cloth  6 each Topical Daily   clonazePAM  0.5 mg Per Tube BID   clopidogrel  75 mg Per Tube Daily   enoxaparin (LOVENOX) injection  0.5 mg/kg Subcutaneous Q24H   famotidine  20 mg Per Tube BID   feeding supplement (PROSource TF)  45 mL Per Tube BID   fiber  1 packet Per Tube BID   Gerhardt's butt cream   Topical BID   guaiFENesin  5 mL Per Tube Q6H   hydrocortisone cream   Topical TID   insulin aspart  0-15 Units Subcutaneous Q4H   insulin aspart  5 Units Subcutaneous Q4H   insulin glargine  40 Units Subcutaneous QHS   levETIRAcetam   2,500 mg Per Tube BID   loratadine  10 mg Per Tube Daily   mouth rinse  15 mL Mouth Rinse 10 times per day   metoprolol tartrate  12.5 mg Per Tube BID   montelukast  10 mg Per Tube Daily   nutrition supplement (JUVEN)  1 packet Per Tube BID   revefenacin  175 mcg Nebulization Daily   tiZANidine  2 mg Per Tube QHS   valproic acid  750 mg Per Tube Q12H   Continuous Infusions:  sodium chloride     feeding supplement (JEVITY 1.5 CAL/FIBER) 1,000 mL (11/02/20 0538)       LOS: 32 days    Time spent: 25 minutes    Ezekiel Slocumb, DO [facility]  11/02/2020, 4:51 PM      If 7PM-7AM, please contact night-coverage. How to contact the [facility] Attending or Consulting provider [place] or covering provider during after hours [place], for this patient?    Check the care team in [facility] and look for a) attending/consulting TRH provider listed and b) the [facility] team listed Log into [email or web address] and use [place]'s universal password to access. If you do not have the password, please contact the hospital operator. Locate the [facility] provider you are looking for under [facility] and page to a number that you can be directly reached. If you still have difficulty reaching the provider, please page the [facility] (Director on Call) for the Hospitalists listed on amion for assistance.

## 2020-11-02 NOTE — Progress Notes (Signed)
RT placed patient on ATC 5L 28%. Patient tolerating well at this time. RN aware. No distress noted at this time. RT will continue to monitor.

## 2020-11-02 NOTE — Progress Notes (Signed)
Patient placed on mechanical ventilatory support due to increase WOB and SOB. Patient respirations were in the upper 30's low 40's and patient appears to be uncomfortable. Placed on previous settings. Minimal secretions noted.

## 2020-11-03 DIAGNOSIS — I5032 Chronic diastolic (congestive) heart failure: Secondary | ICD-10-CM | POA: Diagnosis not present

## 2020-11-03 DIAGNOSIS — Z930 Tracheostomy status: Secondary | ICD-10-CM | POA: Diagnosis not present

## 2020-11-03 LAB — CBC WITH DIFFERENTIAL/PLATELET
Abs Immature Granulocytes: 0.16 10*3/uL — ABNORMAL HIGH (ref 0.00–0.07)
Basophils Absolute: 0.1 10*3/uL (ref 0.0–0.1)
Basophils Relative: 1 %
Eosinophils Absolute: 0.3 10*3/uL (ref 0.0–0.5)
Eosinophils Relative: 3 %
HCT: 34.6 % — ABNORMAL LOW (ref 36.0–46.0)
Hemoglobin: 10.8 g/dL — ABNORMAL LOW (ref 12.0–15.0)
Immature Granulocytes: 2 %
Lymphocytes Relative: 33 %
Lymphs Abs: 3.3 10*3/uL (ref 0.7–4.0)
MCH: 27.6 pg (ref 26.0–34.0)
MCHC: 31.2 g/dL (ref 30.0–36.0)
MCV: 88.3 fL (ref 80.0–100.0)
Monocytes Absolute: 1.2 10*3/uL — ABNORMAL HIGH (ref 0.1–1.0)
Monocytes Relative: 12 %
Neutro Abs: 5.1 10*3/uL (ref 1.7–7.7)
Neutrophils Relative %: 49 %
Platelets: 379 10*3/uL (ref 150–400)
RBC: 3.92 MIL/uL (ref 3.87–5.11)
RDW: 17.3 % — ABNORMAL HIGH (ref 11.5–15.5)
WBC: 10.1 10*3/uL (ref 4.0–10.5)
nRBC: 0.3 % — ABNORMAL HIGH (ref 0.0–0.2)

## 2020-11-03 LAB — COMPREHENSIVE METABOLIC PANEL
ALT: 14 U/L (ref 0–44)
AST: 16 U/L (ref 15–41)
Albumin: 2.7 g/dL — ABNORMAL LOW (ref 3.5–5.0)
Alkaline Phosphatase: 83 U/L (ref 38–126)
Anion gap: 8 (ref 5–15)
BUN: 22 mg/dL (ref 8–23)
CO2: 27 mmol/L (ref 22–32)
Calcium: 10.9 mg/dL — ABNORMAL HIGH (ref 8.9–10.3)
Chloride: 103 mmol/L (ref 98–111)
Creatinine, Ser: 0.46 mg/dL (ref 0.44–1.00)
GFR, Estimated: >60 mL/min (ref >60)
Glucose, Bld: 99 mg/dL (ref 70–99)
Potassium: 4.5 mmol/L (ref 3.5–5.1)
Sodium: 138 mmol/L (ref 135–145)
Total Bilirubin: 0.3 mg/dL (ref 0.3–1.2)
Total Protein: 7 g/dL (ref 6.5–8.1)

## 2020-11-03 LAB — GLUCOSE, CAPILLARY
Glucose-Capillary: 111 mg/dL — ABNORMAL HIGH (ref 70–99)
Glucose-Capillary: 118 mg/dL — ABNORMAL HIGH (ref 70–99)
Glucose-Capillary: 120 mg/dL — ABNORMAL HIGH (ref 70–99)
Glucose-Capillary: 133 mg/dL — ABNORMAL HIGH (ref 70–99)
Glucose-Capillary: 138 mg/dL — ABNORMAL HIGH (ref 70–99)
Glucose-Capillary: 98 mg/dL (ref 70–99)

## 2020-11-03 NOTE — Progress Notes (Signed)
PROGRESS NOTE    Sonia Drake   IOM:[id number]  DOB: [birth year]  PCP: Garwin Brothers, MD    DOA: 10/12/2020 LOS: 40   Assessment & Plan   Principal Problem:   Sepsis ([place]) Active Problems:   COPD (chronic obstructive pulmonary disease) ([place])   Chronic diastolic CHF (congestive heart failure) (HCC)   Acute on chronic respiratory failure with hypoxia (HCC)   GERD (gastroesophageal reflux disease)   Seizures ([place])   Type 2 diabetes mellitus without complication, with long-term current use of insulin (HCC)   Severe hypoxic-ischemic encephalopathy   Tracheostomy dependence ([place])   Pressure injury of skin   Chronic respiratory failure due to anoxic brain injury, dynamic airway collapse (due to tracheobronchomalacia) and advanced COPD 7/12 previously unable to wean to trach collar, another attempt today underway --PCCM following - see their recs -Continue full mechanical ventilation --Trach collar trials per pulmonary - Continue Brovana, Pulmicort, Singulair, Yupelri --maintain sats >92%    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 - Continue Lantus and Novolog + SSI   Hypertension - currently with soft BP's - Continue metoprolol with hold parameters   Advanced COPD FEV1 2019 was 38% No wheezing - not exacerbated at this time -Continue LABA/LAMA, ICS - Continue Singulair, Claritin, famotidine  Cerebrovascular disease, secondary prevention -Continue Plavix   Obesity BMI 36   Resolved issues: Positive blood cultures Blood  cultures on admission growing actually 3 different species of coag negative staph, appear to all be contaminants.   Hypokalemia Resolved    Obesity: Body mass index is 36.94 kg/m.  Complicates overall care and prognosis.  Recommend lifestyle modifications including physical activity and diet for weight loss and overall long-term health.   DVT prophylaxis:    Diet:  Diet Orders (From admission, onward)     Start     Ordered   10/17/2020 1610  Diet NPO time specified  Diet effective now        10/04/2020 1610              Code Status: Full Code   Brief Narrative / Hospital Course to Date:   Sonia Drake is a 65 y.o. F with DM, COPD, seizures, and anoxic brain injury due to PEA cardiac arrest Aug 2021 at [facility] now in with persistent vegetative state (opens eyes but does not follow commands), tracheostomy dependent who presented with respiratory distress from SNF.  6/12 admitted from skilled nursing facility after reported aspiration event, copious secretions, and respiratory distress; working diagnosis was sepsis from CXR-negative aspiration pneumonia and abx were started 6/13 appeared comfortable on 35% ATC. No distress. WBC ct trending down.  Blood cultures growing 2 different CoNS, likely contaminant 6/14 abx stopped, no further fever 6/16 increased work of breathing, upper airway noise, abdominal muscle use.  Bronchoscopy performed as trach exchange cuffless #8 Shiley XLT showed dynamic airway collapse, tracheal collapse. VQ scan 6/15> Neg for acute PE 6/16 continued respiratory distress moved to ICU, changed to cuffed #6 Shiley XLT, placed on MV with some improved comfort Vanc restarted 6/18 to 6/19, then  stopped, no further fever, WBC normalized off abx.    6/23 mucus plugging, severe vent dyssynchrony 6/24-25 intermittent jerking, seizures on EEG propofol restarted 6/27 weaned off propofol gtt 6/28 briefly tolerated trach collar in AM 6/29 tolerated trach collar in AM; no  further seizure like activity; working with CM/SW for vent-SNF 7/1 Weaning on CPAP/ PS 10/5, CM SW working on [employer], NO seizure activity, secretions are not an issue per nursing 7/1 - 7/5 has remained stable 7/5-7/11 has consistently failed to wean to trach collar  Subjective 11/03/20    Pt seen this AM.  Yesterday aftternoon had increased WOB and copious secretions.  Doing much better when seen today.  On trach collar and respirations unlabored.  No secretion sounds.  No other acute events reported.   Disposition Plan & Communication   Status is: Inpatient  Remains inpatient appropriate because:Inpatient level of care appropriate due to severity of illness.  Requires LTAC or vent-capable SNF.  Dispo: The patient is from: SNF              Anticipated d/c is to: LTAC vs SNF-vent              Patient currently is not medically stable to d/c.   Difficult to place patient Yes   Consults, Procedures, Significant Events   Consultants:  PCCM  Procedures:  As above, significant events  Antimicrobials:  Anti-infectives (From admission, onward)    Start     Dose/Rate Route Frequency Ordered Stop   10/07/20 1400  vancomycin (VANCOREADY) IVPB 1500 mg/300 mL  Status:  Discontinued        1,500 mg 150 mL/hr over 120 Minutes Intravenous Every 24 hours 10/07/20 1223 10/09/20 0829   10/07/20 1200  ceFEPIme (MAXIPIME) 2 g in sodium chloride 0.9 % 100 mL IVPB  Status:  Discontinued        2 g 200 mL/hr over 30 Minutes Intravenous Every 8 hours 10/07/20 0828 10/07/20 1221   10/02/20 1400  vancomycin (VANCOREADY) IVPB 1250 mg/250 mL  Status:  Discontinued        1,250 mg 166.7 mL/hr over 90 Minutes Intravenous Every 24 hours 09/25/2020 1313 10/04/20 1221   10/05/2020 2200  ceFEPIme (MAXIPIME) 2 g in sodium chloride 0.9 % 100 mL IVPB  Status:  Discontinued        2 g 200 mL/hr over 30 Minutes Intravenous Every 8 hours 10/13/2020 1313 10/04/20 1221   10/02/2020 2200  metroNIDAZOLE (FLAGYL) IVPB 500  mg  Status:  Discontinued        500 mg 100 mL/hr over 60 Minutes Intravenous Every 8 hours 10/13/2020 1616 10/04/20 1221   10/08/2020 1330  vancomycin (VANCOREADY) IVPB 2000 mg/400 mL        2,000 mg 200 mL/hr over 120 Minutes Intravenous  Once 10/18/2020 1257 10/18/2020 1550   10/12/2020 1300  ceFEPIme (MAXIPIME) 2 g in sodium chloride 0.9 % 100 mL IVPB        2 g 200 mL/hr over 30 Minutes Intravenous  Once 10/03/2020 1257 09/25/2020 1330   10/10/2020 1300  metroNIDAZOLE (FLAGYL) IVPB 500 mg        500 mg 100 mL/hr over 60 Minutes Intravenous  Once 09/28/2020 1257 10/05/2020 1443         Micro    Objective   Vitals:   11/03/20 1200 11/03/20 1300 11/03/20 1400 11/03/20 1500  BP: (!) 79/50 104/70 109/78 138/90  Pulse: 80 86 73 91  Resp: 19  Temp:      TempSrc:      SpO2: 97%     Weight:      Height:        Intake/Output Summary (Last 24 hours) at 11/03/2020 1520 Last data filed at 11/03/2020 1500 Gross per 24 hour  Intake 1590 ml  Output 1350 ml  Net 240 ml   Filed Weights   11/01/20 0324 11/02/20 0500 11/03/20 0411  Weight: 90.7 kg 90.7 kg 91.6 kg    Physical Exam:  General exam: unresponsive, no acute distress, obese Respiratory system: trach collar on 5 L/min O2, normal respiratory effort, clear anteriorly Cardiovascular system: normal S1/S2, RRR Gastrointestinal system: soft, G tube present, TF's running Extremities: b/l hands and UE edema, no LE edema    Labs   Data Reviewed: I have personally reviewed following labs and imaging studies  CBC: Recent Labs  Lab 11/03/20 0630  WBC 10.1  NEUTROABS 5.1  HGB 10.8*  HCT 34.6*  MCV 88.3  PLT 185   Basic Metabolic Panel: Recent Labs  Lab 11/03/20 0630  NA 138  K 4.5  CL 103  CO2 27  GLUCOSE 99  BUN 22  CREATININE 0.46  CALCIUM 10.9*   GFR: Estimated Creatinine Clearance: 72.8 mL/min (by C-G formula based on SCr of 0.46 mg/dL). Liver Function Tests: Recent Labs  Lab 11/03/20 0630  AST 16  ALT 14   ALKPHOS 83  BILITOT 0.3  PROT 7.0  ALBUMIN 2.7*   No results for input(s): LIPASE, AMYLASE in the last 168 hours. No results for input(s): AMMONIA in the last 168 hours. Coagulation Profile: No results for input(s): INR, PROTIME in the last 168 hours. Cardiac Enzymes: No results for input(s): CKTOTAL, CKMB, CKMBINDEX, TROPONINI in the last 168 hours. BNP (last 3 results) No results for input(s): PROBNP in the last 8760 hours. HbA1C: No results for input(s): HGBA1C in the last 72 hours. CBG: Recent Labs  Lab 11/02/20 1948 11/02/20 2352 11/03/20 0326 11/03/20 0744 11/03/20 1111  GLUCAP 160* 137* 120* 98 111*   Lipid Profile: No results for input(s): CHOL, HDL, LDLCALC, TRIG, CHOLHDL, LDLDIRECT in the last 72 hours. Thyroid Function Tests: No results for input(s): TSH, T4TOTAL, FREET4, T3FREE, THYROIDAB in the last 72 hours. Anemia Panel: No results for input(s): VITAMINB12, FOLATE, FERRITIN, TIBC, IRON, RETICCTPCT in the last 72 hours. Sepsis Labs: No results for input(s): PROCALCITON, LATICACIDVEN in the last 168 hours.  No results found for this or any previous visit (from the past 240 hour(s)).     Imaging Studies   No results found.   Medications   Scheduled Meds:  arformoterol  15 mcg Nebulization BID   baclofen  5 mg Per Tube TID   budesonide  0.5 mg Nebulization BID   chlorhexidine gluconate (MEDLINE KIT)  15 mL Mouth Rinse BID   Chlorhexidine Gluconate Cloth  6 each Topical Daily   clonazePAM  0.5 mg Per Tube BID   clopidogrel  75 mg Per Tube Daily   enoxaparin (LOVENOX) injection  0.5 mg/kg Subcutaneous Q24H   famotidine  20 mg Per Tube BID   feeding supplement (PROSource TF)  45 mL Per Tube BID   fiber  1 packet Per Tube BID   Gerhardt's butt cream   Topical BID   guaiFENesin  5 mL Per Tube Q6H   hydrocortisone cream   Topical TID   insulin aspart  0-15 Units Subcutaneous Q4H   insulin aspart  5 Units Subcutaneous Q4H  insulin glargine  40  Units Subcutaneous QHS   levETIRAcetam  2,500 mg Per Tube BID   loratadine  10 mg Per Tube Daily   mouth rinse  15 mL Mouth Rinse 10 times per day   metoprolol tartrate  12.5 mg Per Tube BID   montelukast  10 mg Per Tube Daily   nutrition supplement (JUVEN)  1 packet Per Tube BID   revefenacin  175 mcg Nebulization Daily   tiZANidine  2 mg Per Tube QHS   valproic acid  750 mg Per Tube Q12H   Continuous Infusions:  sodium chloride     feeding supplement (JEVITY 1.5 CAL/FIBER) 1,000 mL (11/03/20 0607)       LOS: 33 days    Time spent: 20 minutes    Ezekiel Slocumb, DO [facility]  11/03/2020, 3:20 PM      If 7PM-7AM, please contact night-coverage. How to contact the [facility] Attending or Consulting provider [place] or covering provider during after hours [place], for this patient?    Check the care team in [facility] and look for a) attending/consulting TRH provider listed and b) the [facility] team listed Log into [email or web address] and use [place]'s universal password to access. If you do not have the password, please contact the hospital operator. Locate the [facility] provider you are looking for under [facility] and page to a number that you can be directly reached. If you still have difficulty reaching the provider, please page the [facility] (Director on Call) for the Hospitalists listed on amion for assistance.

## 2020-11-04 DIAGNOSIS — J9621 Acute and chronic respiratory failure with hypoxia: Secondary | ICD-10-CM | POA: Diagnosis not present

## 2020-11-04 DIAGNOSIS — Z930 Tracheostomy status: Secondary | ICD-10-CM | POA: Diagnosis not present

## 2020-11-04 LAB — GLUCOSE, CAPILLARY
Glucose-Capillary: 106 mg/dL — ABNORMAL HIGH (ref 70–99)
Glucose-Capillary: 107 mg/dL — ABNORMAL HIGH (ref 70–99)
Glucose-Capillary: 117 mg/dL — ABNORMAL HIGH (ref 70–99)
Glucose-Capillary: 120 mg/dL — ABNORMAL HIGH (ref 70–99)
Glucose-Capillary: 130 mg/dL — ABNORMAL HIGH (ref 70–99)
Glucose-Capillary: 93 mg/dL (ref 70–99)

## 2020-11-04 NOTE — Progress Notes (Signed)
RT NOTE:  Pt placed on ATC 5L 28%. Pt is tolerating well at this time, RN aware. No distress noted at this time. RT will continue to monitor.

## 2020-11-04 NOTE — Progress Notes (Signed)
PROGRESS NOTE    Sonia Drake   FTD:[id number]  DOB: July 10, 1955  PCP: Sonia Brothers, MD    DOA: 10/17/2020 LOS: 34   Assessment & Plan   Principal Problem:   Sepsis ([place]) Active Problems:   COPD (chronic obstructive pulmonary disease) ([place])   Chronic diastolic CHF (congestive heart failure) (HCC)   Acute on chronic respiratory failure with hypoxia (HCC)   GERD (gastroesophageal reflux disease)   Seizures ([place])   Type 2 diabetes mellitus without complication, with long-term current use of insulin (HCC)   Severe hypoxic-ischemic encephalopathy   Tracheostomy dependence ([place])   Pressure injury of skin   Chronic respiratory failure due to anoxic brain injury, dynamic airway collapse (due to tracheobronchomalacia) and advanced COPD 7/12 previously unable to wean to trach collar, another attempt today underway --PCCM following - see their recs -Continue full mechanical ventilation --Trach collar trials per pulmonary - Continue Brovana, Pulmicort, Singulair, Yupelri --maintain sats >92%    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 - Continue Lantus and Novolog + SSI   Hypertension - currently with soft BP's - Continue metoprolol with hold parameters   Advanced COPD FEV1 2019 was 38% No wheezing - not exacerbated at this time -Continue LABA/LAMA, ICS - Continue Singulair, Claritin, famotidine  Cerebrovascular disease, secondary prevention -Continue Plavix   Obesity BMI 36   Resolved issues: Positive blood cultures Blood  cultures on admission growing actually 3 different species of coag negative staph, appear to all be contaminants.   Hypokalemia Resolved    Obesity: Body mass index is 36.57 kg/m.  Complicates overall care and prognosis.  Recommend lifestyle modifications including physical activity and diet for weight loss and overall long-term health.   DVT prophylaxis:    Diet:  Diet Orders (From admission, onward)     Start     Ordered   09/22/2020 1610  Diet NPO time specified  Diet effective now        10/11/2020 1610              Code Status: Full Code   Brief Narrative / Hospital Course to Date:   Sonia Drake is a 65 y.o. F with DM, COPD, seizures, and anoxic brain injury due to PEA cardiac arrest Aug 2021 at [facility] now in with persistent vegetative state (opens eyes but does not follow commands), tracheostomy dependent who presented with respiratory distress from SNF.  6/12 admitted from skilled nursing facility after reported aspiration event, copious secretions, and respiratory distress; working diagnosis was sepsis from CXR-negative aspiration pneumonia and abx were started 6/13 appeared comfortable on 35% ATC. No distress. WBC ct trending down.  Blood cultures growing 2 different CoNS, likely contaminant 6/14 abx stopped, no further fever 6/16 increased work of breathing, upper airway noise, abdominal muscle use.  Bronchoscopy performed as trach exchange cuffless #8 Shiley XLT showed dynamic airway collapse, tracheal collapse. VQ scan 6/15> Neg for acute PE 6/16 continued respiratory distress moved to ICU, changed to cuffed #6 Shiley XLT, placed on MV with some improved comfort Vanc restarted 6/18 to 6/19, then  stopped, no further fever, WBC normalized off abx.    6/23 mucus plugging, severe vent dyssynchrony 6/24-25 intermittent jerking, seizures on EEG propofol restarted 6/27 weaned off propofol gtt 6/28 briefly tolerated trach collar in AM 6/29 tolerated trach collar in AM; no  further seizure like activity; working with CM/SW for vent-SNF 7/1 Weaning on CPAP/ PS 10/5, CM SW working on [employer], NO seizure activity, secretions are not an issue per nursing 7/1 - 7/5 has remained stable 7/5-7/11 has consistently failed to wean to trach collar  Subjective 11/04/20    Pt seen this AM. Eyes are open but does not track, respond or interact.  No acute events reported.  Tachycardic on monitor but appears breathing comfortably at this time.   Disposition Plan & Communication   Status is: Inpatient  Remains inpatient appropriate because:Inpatient level of care appropriate due to severity of illness.  Requires LTAC or vent-capable SNF.  Dispo: The patient is from: SNF              Anticipated d/c is to: LTAC vs SNF-vent              Patient currently is not medically stable to d/c.   Difficult to place patient Yes  Prognosis is extremely poor for any meaningful improvement.  Recommend comfort care measures. Family have wanted to continue full scope of care.   Consults, Procedures, Significant Events   Consultants:  PCCM  Procedures:  As above, significant events  Antimicrobials:  Anti-infectives (From admission, onward)    Start     Dose/Rate Route Frequency Ordered Stop   10/07/20 1400  vancomycin (VANCOREADY) IVPB 1500 mg/300 mL  Status:  Discontinued        1,500 mg 150 mL/hr over 120 Minutes Intravenous Every 24 hours 10/07/20 1223 10/09/20 0829   10/07/20 1200  ceFEPIme (MAXIPIME) 2 g in sodium chloride 0.9 % 100 mL IVPB  Status:  Discontinued        2 g 200 mL/hr over 30 Minutes Intravenous Every 8 hours 10/07/20 0828 10/07/20 1221   10/02/20 1400  vancomycin (VANCOREADY) IVPB 1250 mg/250 mL  Status:  Discontinued        1,250 mg 166.7 mL/hr over 90 Minutes Intravenous Every 24 hours 09/20/2020 1313 10/04/20 1221   10/13/2020 2200  ceFEPIme (MAXIPIME) 2 g in sodium chloride 0.9 % 100 mL IVPB  Status:  Discontinued        2 g 200 mL/hr over 30 Minutes  Intravenous Every 8 hours 10/03/2020 1313 10/04/20 1221   10/15/2020 2200  metroNIDAZOLE (FLAGYL) IVPB 500 mg  Status:  Discontinued        500 mg 100 mL/hr over 60 Minutes Intravenous Every 8 hours 10/07/2020 1616 10/04/20 1221   10/05/2020 1330  vancomycin (VANCOREADY) IVPB 2000 mg/400 mL        2,000 mg 200 mL/hr over 120 Minutes Intravenous  Once 09/30/2020 1257 10/17/2020 1550   10/12/2020 1300  ceFEPIme (MAXIPIME) 2 g in sodium chloride 0.9 % 100 mL IVPB        2 g 200 mL/hr over 30 Minutes Intravenous  Once 10/19/2020 1257 10/09/2020 1330   09/21/2020 1300  metroNIDAZOLE (FLAGYL) IVPB 500 mg        500 mg 100 mL/hr over 60 Minutes Intravenous  Once 10/08/2020 1257 10/15/2020 1443         Micro    Objective   Vitals:   11/04/20 0900 11/04/20 1000 11/04/20 1100 11/04/20 1102  BP:  136/73 (!) 147/88 104/70 104/70  Pulse: (!) 103 (!) 117 (!) 107 (!) 103  Resp: (!) 24 (!) 29 (!) 26 (!) 22  Temp:      TempSrc:      SpO2: 98% 98% 100% 96%  Weight:      Height:        Intake/Output Summary (Last 24 hours) at 11/04/2020 1159 Last data filed at 11/04/2020 1100 Gross per 24 hour  Intake 915 ml  Output 1100 ml  Net -185 ml   Filed Weights   11/02/20 0500 11/03/20 0411 11/04/20 0500  Weight: 90.7 kg 91.6 kg 90.7 kg    Physical Exam:  General exam: eyes open but unresponsive, no acute distress, obese Respiratory system: trach collar on 5 L/min O2, normal respiratory effort, clear anteriorly Cardiovascular system: normal S1/S2, tachycardic, regular rhythm, DP pulses 2+ b/l Gastrointestinal system: soft, G tube present, TF's running Extremities: b/l hands and UE edema, no LE edema    Labs   Data Reviewed: I have personally reviewed following labs and imaging studies  CBC: Recent Labs  Lab 11/03/20 0630  WBC 10.1  NEUTROABS 5.1  HGB 10.8*  HCT 34.6*  MCV 88.3  PLT 850   Basic Metabolic Panel: Recent Labs  Lab 11/03/20 0630  NA 138  K 4.5  CL 103  CO2 27  GLUCOSE 99  BUN 22   CREATININE 0.46  CALCIUM 10.9*   GFR: Estimated Creatinine Clearance: 72.4 mL/min (by C-G formula based on SCr of 0.46 mg/dL). Liver Function Tests: Recent Labs  Lab 11/03/20 0630  AST 16  ALT 14  ALKPHOS 83  BILITOT 0.3  PROT 7.0  ALBUMIN 2.7*   No results for input(s): LIPASE, AMYLASE in the last 168 hours. No results for input(s): AMMONIA in the last 168 hours. Coagulation Profile: No results for input(s): INR, PROTIME in the last 168 hours. Cardiac Enzymes: No results for input(s): CKTOTAL, CKMB, CKMBINDEX, TROPONINI in the last 168 hours. BNP (last 3 results) No results for input(s): PROBNP in the last 8760 hours. HbA1C: No results for input(s): HGBA1C in the last 72 hours. CBG: Recent Labs  Lab 11/03/20 1939 11/03/20 2341 11/04/20 0324 11/04/20 0721 11/04/20 1107  GLUCAP 133* 138* 117* 93 120*   Lipid Profile: No results for input(s): CHOL, HDL, LDLCALC, TRIG, CHOLHDL, LDLDIRECT in the last 72 hours. Thyroid Function Tests: No results for input(s): TSH, T4TOTAL, FREET4, T3FREE, THYROIDAB in the last 72 hours. Anemia Panel: No results for input(s): VITAMINB12, FOLATE, FERRITIN, TIBC, IRON, RETICCTPCT in the last 72 hours. Sepsis Labs: No results for input(s): PROCALCITON, LATICACIDVEN in the last 168 hours.  No results found for this or any previous visit (from the past 240 hour(s)).     Imaging Studies   No results found.   Medications   Scheduled Meds:  arformoterol  15 mcg Nebulization BID   baclofen  5 mg Per Tube TID   budesonide  0.5 mg Nebulization BID   chlorhexidine gluconate (MEDLINE KIT)  15 mL Mouth Rinse BID   Chlorhexidine Gluconate Cloth  6 each Topical Daily   clonazePAM  0.5 mg Per Tube BID   clopidogrel  75 mg Per Tube Daily   enoxaparin (LOVENOX) injection  0.5 mg/kg Subcutaneous Q24H   famotidine  20 mg Per Tube BID   feeding supplement (PROSource TF)  45 mL Per Tube BID   fiber  1 packet Per Tube BID   Gerhardt's butt  cream   Topical BID  guaiFENesin  5 mL Per Tube Q6H   hydrocortisone cream   Topical TID   insulin aspart  0-15 Units Subcutaneous Q4H   insulin aspart  5 Units Subcutaneous Q4H   insulin glargine  40 Units Subcutaneous QHS   levETIRAcetam  2,500 mg Per Tube BID   loratadine  10 mg Per Tube Daily   mouth rinse  15 mL Mouth Rinse 10 times per day   metoprolol tartrate  12.5 mg Per Tube BID   montelukast  10 mg Per Tube Daily   nutrition supplement (JUVEN)  1 packet Per Tube BID   revefenacin  175 mcg Nebulization Daily   tiZANidine  2 mg Per Tube QHS   valproic acid  750 mg Per Tube Q12H   Continuous Infusions:  sodium chloride     feeding supplement (JEVITY 1.5 CAL/FIBER) 1,000 mL (11/04/20 0611)       LOS: 34 days    Time spent: 20 minutes    Ezekiel Slocumb, DO [facility]  11/04/2020, 11:59 AM      If 7PM-7AM, please contact night-coverage. How to contact the [facility] Attending or Consulting provider [place] or covering provider during after hours [place], for this patient?    Check the care team in [facility] and look for a) attending/consulting TRH provider listed and b) the [facility] team listed Log into [email or web address] and use [place]'s universal password to access. If you do not have the password, please contact the hospital operator. Locate the [facility] provider you are looking for under [facility] and page to a number that you can be directly reached. If you still have difficulty reaching the provider, please page the [facility] (Director on Call) for the Hospitalists listed on amion for assistance.

## 2020-11-04 NOTE — Progress Notes (Signed)
Pt on 28% trach collar with incresased respiratory  and heart rates rate; rhonchi throughout on ausculation; copious amounts of frothy white secretions noted in trach; pt in-lined suctioned with return of copious amounts of thick white secretions; pt placed back on full support due to HR 130's and RR 35; RT notified.

## 2020-11-04 NOTE — Progress Notes (Signed)
Pt experiencing rapid eye lid fluttering, and ? right gaze deviation; right pupil is slightly larger than left, ? Dolls eyes noted; findings verified with Willia Craze, RN, charge nurse; Dr Delton Coombes, CCM notified; order received to give prn Lorazepam as ordered prn seizures; 2 mg Lorazepam given IV; pt's HR now in 90s; rapid eyelid movement noted; pupils equal bilaterally; will continue to monitor patient.

## 2020-11-05 DIAGNOSIS — Z930 Tracheostomy status: Secondary | ICD-10-CM | POA: Diagnosis not present

## 2020-11-05 DIAGNOSIS — R569 Unspecified convulsions: Secondary | ICD-10-CM | POA: Diagnosis not present

## 2020-11-05 DIAGNOSIS — I5032 Chronic diastolic (congestive) heart failure: Secondary | ICD-10-CM | POA: Diagnosis not present

## 2020-11-05 LAB — GLUCOSE, CAPILLARY
Glucose-Capillary: 112 mg/dL — ABNORMAL HIGH (ref 70–99)
Glucose-Capillary: 116 mg/dL — ABNORMAL HIGH (ref 70–99)
Glucose-Capillary: 122 mg/dL — ABNORMAL HIGH (ref 70–99)
Glucose-Capillary: 137 mg/dL — ABNORMAL HIGH (ref 70–99)
Glucose-Capillary: 97 mg/dL (ref 70–99)
Glucose-Capillary: 99 mg/dL (ref 70–99)

## 2020-11-05 LAB — CK: Total CK: 226 U/L (ref 38–234)

## 2020-11-05 LAB — VALPROIC ACID LEVEL: Valproic Acid Lvl: 48 ug/mL — ABNORMAL LOW (ref 50.0–100.0)

## 2020-11-05 MED ORDER — VALPROIC ACID 250 MG/5ML PO SOLN
1000.0000 mg | Freq: Two times a day (BID) | ORAL | Status: DC
  Administered 2020-11-05 – 2021-01-27 (×164): 1000 mg
  Filled 2020-11-05 (×166): qty 20

## 2020-11-05 NOTE — Progress Notes (Signed)
Pt placed on ATC 5L 28%. Pt is tolerating well at this time, RN aware. No distress noted at this time. RT will continue to monitor.

## 2020-11-05 NOTE — Progress Notes (Addendum)
PROGRESS NOTE    Sonia Drake   TWS:[id number]  DOB: [birth year]  PCP: Sonia Brothers, MD    DOA: 10/02/2020 LOS: 35   Assessment & Plan   Principal Problem:   Sepsis ([place]) Active Problems:   COPD (chronic obstructive pulmonary disease) ([place])   Chronic diastolic CHF (congestive heart failure) (HCC)   Acute on chronic respiratory failure with hypoxia (HCC)   GERD (gastroesophageal reflux disease)   Seizures ([place])   Type 2 diabetes mellitus without complication, with long-term current use of insulin (HCC)   Severe hypoxic-ischemic encephalopathy   Tracheostomy dependence ([place])   Pressure injury of skin   Chronic respiratory failure due to anoxic brain injury, dynamic airway collapse (due to tracheobronchomalacia) and advanced COPD 7/12 previously unable to wean to trach collar, another attempt today underway --PCCM following - see their recs -Continue full mechanical ventilation --Trach collar trials per pulmonary - Continue Brovana, Pulmicort, Singulair, Yupelri --maintain sats >92%    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 - Continue Lantus and Novolog + SSI   Hypertension - currently with soft BP's - Continue metoprolol with hold parameters   Advanced COPD FEV1 2019 was 38% No wheezing - not exacerbated at this time -Continue LABA/LAMA, ICS - Continue Singulair, Claritin, famotidine  Cerebrovascular disease, secondary prevention -Continue Plavix    Obesity BMI 36   Resolved issues: Positive blood cultures Blood cultures on admission growing actually 3 different species of coag negative staph, appear to all be contaminants.   Hypokalemia Resolved    Obesity: Body mass index is 36.29 kg/m.  Complicates overall care and prognosis.  Recommend lifestyle modifications including physical activity and diet for weight loss and overall long-term health.   DVT prophylaxis:    Diet:  Diet Orders (From admission, onward)     Start     Ordered   10/19/2020 1610  Diet NPO time specified  Diet effective now        09/20/2020 1610              Code Status: Full Code   Brief Narrative / Hospital Course to Date:   [name] is a 65 y.o. F with DM, COPD, seizures, and anoxic brain injury due to PEA cardiac arrest Aug 2021 at [facility] now in with persistent vegetative state (opens eyes but does not follow commands), tracheostomy dependent who presented with respiratory distress from SNF.  6/12 admitted from skilled nursing facility after reported aspiration event, copious secretions, and respiratory distress; working diagnosis was sepsis from CXR-negative aspiration pneumonia and abx were started 6/13 appeared comfortable on 35% ATC. No distress. WBC ct trending down.  Blood cultures growing 2 different CoNS, likely contaminant 6/14 abx stopped, no further fever 6/16 increased work of breathing, upper airway noise, abdominal muscle use.  Bronchoscopy performed as trach exchange cuffless #8 Shiley XLT showed dynamic airway collapse, tracheal collapse. VQ scan 6/15> Neg for acute PE 6/16 continued respiratory distress moved to ICU, changed to cuffed #6 Shiley XLT, placed on MV with some  improved comfort Vanc restarted 6/18 to 6/19, then stopped, no further fever, WBC normalized off abx.    6/23 mucus plugging, severe vent dyssynchrony 6/24-25 intermittent jerking, seizures on EEG propofol restarted 6/27 weaned off propofol gtt 6/28 briefly  tolerated trach collar in AM 6/29 tolerated trach collar in AM; no further seizure like activity; working with CM/SW for vent-SNF 7/1 Weaning on CPAP/ PS 10/5, CM SW working on [employer], NO seizure activity, secretions are not an issue per nursing 7/1 - 7/5 has remained stable 7/5-7/11 has consistently failed to wean to trach collar  Subjective 11/05/20    Pt seen this AM. Discussed with bedside RN and notes reviewed.  Pt has had some signs of possible seizure activity with rapid eye movements and some twitching.  No other acute events.  Pt eyes open and blinking.  Otherwise non-responsive.   Disposition Plan & Communication   Status is: Inpatient  Remains inpatient appropriate because:Inpatient level of care appropriate due to severity of illness.  Requires LTAC or vent-capable SNF.  Dispo: The patient is from: SNF              Anticipated d/c is to: LTAC vs SNF-vent              Patient currently is not medically stable to d/c.   Difficult to place patient Yes  Prognosis is extremely poor for any meaningful improvement.  Recommend comfort care measures. Family have wanted to continue full scope of care.   Consults, Procedures, Significant Events   Consultants:  PCCM  Procedures:  As above, significant events  Antimicrobials:  Anti-infectives (From admission, onward)    Start     Dose/Rate Route Frequency Ordered Stop   10/07/20 1400  vancomycin (VANCOREADY) IVPB 1500 mg/300 mL  Status:  Discontinued        1,500 mg 150 mL/hr over 120 Minutes Intravenous Every 24 hours 10/07/20 1223 10/09/20 0829   10/07/20 1200  ceFEPIme (MAXIPIME) 2 g in sodium chloride 0.9 % 100 mL IVPB  Status:  Discontinued        2 g 200 mL/hr over 30 Minutes Intravenous Every 8 hours 10/07/20 0828 10/07/20 1221   10/02/20 1400  vancomycin (VANCOREADY) IVPB 1250 mg/250 mL  Status:  Discontinued        1,250 mg 166.7 mL/hr over 90 Minutes Intravenous Every 24 hours 10/11/2020 1313 10/04/20 1221    09/28/2020 2200  ceFEPIme (MAXIPIME) 2 g in sodium chloride 0.9 % 100 mL IVPB  Status:  Discontinued        2 g 200 mL/hr over 30 Minutes Intravenous Every 8 hours 09/21/2020 1313 10/04/20 1221   10/05/2020 2200  metroNIDAZOLE (FLAGYL) IVPB 500 mg  Status:  Discontinued        500 mg 100 mL/hr over 60 Minutes Intravenous Every 8 hours 09/21/2020 1616 10/04/20 1221   09/27/2020 1330  vancomycin (VANCOREADY) IVPB 2000 mg/400 mL        2,000 mg 200 mL/hr over 120 Minutes Intravenous  Once 10/10/2020 1257 09/24/2020 1550   10/11/2020 1300  ceFEPIme (MAXIPIME) 2 g in sodium chloride 0.9 % 100 mL IVPB        2 g 200 mL/hr over 30 Minutes Intravenous  Once 09/24/2020 1257 10/12/2020 1330   10/18/2020 1300  metroNIDAZOLE (FLAGYL) IVPB 500 mg        500 mg 100 mL/hr over 60 Minutes Intravenous  Once 10/14/2020 1257 09/21/2020 1443  Micro    Objective   Vitals:   11/05/20 1306 11/05/20 1400 11/05/20 1513 11/05/20 1602  BP: 90/63 (!) 142/86  (!) 135/92  Pulse: 84 95  99  Resp: (!) 23 (!) 27  (!) 22  Temp:   98.4 F (36.9 C)   TempSrc:   Axillary   SpO2: 93% 96%  97%  Weight:      Height:        Intake/Output Summary (Last 24 hours) at 11/05/2020 1612 Last data filed at 11/05/2020 1400 Gross per 24 hour  Intake 1770 ml  Output 1000 ml  Net 770 ml   Filed Weights   11/03/20 0411 11/04/20 0500 11/05/20 0500  Weight: 91.6 kg 90.7 kg 90 kg    Physical Exam:  General exam: eyes open but unresponsive, no acute distress, obese HEENT; pupils equal, right gaze deviation, no REM's seen Respiratory system: trach collar on 5 L/min O2, normal respiratory effort, clear anteriorly Cardiovascular system: normal S1/S2, tachycardic, regular rhythm Gastrointestinal system: soft, G tube present, TF's running Extremities: b/l hands and UE edema, no LE edema, no tremors seen, no movement of toes on Babinski b/l    Labs   Data Reviewed: I have personally reviewed following labs and imaging  studies  CBC: Recent Labs  Lab 11/03/20 0630  WBC 10.1  NEUTROABS 5.1  HGB 10.8*  HCT 34.6*  MCV 88.3  PLT 324   Basic Metabolic Panel: Recent Labs  Lab 11/03/20 0630  NA 138  K 4.5  CL 103  CO2 27  GLUCOSE 99  BUN 22  CREATININE 0.46  CALCIUM 10.9*   GFR: Estimated Creatinine Clearance: 72.2 mL/min (by C-G formula based on SCr of 0.46 mg/dL). Liver Function Tests: Recent Labs  Lab 11/03/20 0630  AST 16  ALT 14  ALKPHOS 83  BILITOT 0.3  PROT 7.0  ALBUMIN 2.7*   No results for input(s): LIPASE, AMYLASE in the last 168 hours. No results for input(s): AMMONIA in the last 168 hours. Coagulation Profile: No results for input(s): INR, PROTIME in the last 168 hours. Cardiac Enzymes: No results for input(s): CKTOTAL, CKMB, CKMBINDEX, TROPONINI in the last 168 hours. BNP (last 3 results) No results for input(s): PROBNP in the last 8760 hours. HbA1C: No results for input(s): HGBA1C in the last 72 hours. CBG: Recent Labs  Lab 11/04/20 2338 11/05/20 0329 11/05/20 0753 11/05/20 1108 11/05/20 1511  GLUCAP 107* 99 137* 112* 97   Lipid Profile: No results for input(s): CHOL, HDL, LDLCALC, TRIG, CHOLHDL, LDLDIRECT in the last 72 hours. Thyroid Function Tests: No results for input(s): TSH, T4TOTAL, FREET4, T3FREE, THYROIDAB in the last 72 hours. Anemia Panel: No results for input(s): VITAMINB12, FOLATE, FERRITIN, TIBC, IRON, RETICCTPCT in the last 72 hours. Sepsis Labs: No results for input(s): PROCALCITON, LATICACIDVEN in the last 168 hours.  No results found for this or any previous visit (from the past 240 hour(s)).     Imaging Studies   No results found.   Medications   Scheduled Meds:  arformoterol  15 mcg Nebulization BID   baclofen  5 mg Per Tube TID   budesonide  0.5 mg Nebulization BID   chlorhexidine gluconate (MEDLINE KIT)  15 mL Mouth Rinse BID   Chlorhexidine Gluconate Cloth  6 each Topical Daily   clonazePAM  0.5 mg Per Tube BID    clopidogrel  75 mg Per Tube Daily   enoxaparin (LOVENOX) injection  0.5 mg/kg Subcutaneous Q24H   famotidine  20 mg  Per Tube BID   feeding supplement (PROSource TF)  45 mL Per Tube BID   fiber  1 packet Per Tube BID   Gerhardt's butt cream   Topical BID   guaiFENesin  5 mL Per Tube Q6H   hydrocortisone cream   Topical TID   insulin aspart  0-15 Units Subcutaneous Q4H   insulin aspart  5 Units Subcutaneous Q4H   insulin glargine  40 Units Subcutaneous QHS   levETIRAcetam  2,500 mg Per Tube BID   loratadine  10 mg Per Tube Daily   mouth rinse  15 mL Mouth Rinse 10 times per day   metoprolol tartrate  12.5 mg Per Tube BID   montelukast  10 mg Per Tube Daily   nutrition supplement (JUVEN)  1 packet Per Tube BID   revefenacin  175 mcg Nebulization Daily   tiZANidine  2 mg Per Tube QHS   valproic acid  1,000 mg Per Tube Q12H   Continuous Infusions:  sodium chloride     feeding supplement (JEVITY 1.5 CAL/FIBER) 1,000 mL (11/05/20 0350)       LOS: 35 days    Time spent: 30 minutes    Ezekiel Slocumb, DO [facility]  11/05/2020, 4:12 PM      If 7PM-7AM, please contact night-coverage. How to contact the [facility] Attending or Consulting provider [place] or covering provider during after hours [place], for this patient?    Check the care team in [facility] and look for a) attending/consulting TRH provider listed and b) the [facility] team listed Log into [email or web address] and use [place]'s universal password to access. If you do not have the password, please contact the hospital operator. Locate the [facility] provider you are looking for under [facility] and page to a number that you can be directly reached. If you still have difficulty reaching the provider, please page the [facility] (Director on Call) for the Hospitalists listed on amion for assistance.

## 2020-11-06 DIAGNOSIS — Z930 Tracheostomy status: Secondary | ICD-10-CM | POA: Diagnosis not present

## 2020-11-06 DIAGNOSIS — J9621 Acute and chronic respiratory failure with hypoxia: Secondary | ICD-10-CM

## 2020-11-06 DIAGNOSIS — I5032 Chronic diastolic (congestive) heart failure: Secondary | ICD-10-CM | POA: Diagnosis not present

## 2020-11-06 LAB — GLUCOSE, CAPILLARY
Glucose-Capillary: 127 mg/dL — ABNORMAL HIGH (ref 70–99)
Glucose-Capillary: 132 mg/dL — ABNORMAL HIGH (ref 70–99)
Glucose-Capillary: 135 mg/dL — ABNORMAL HIGH (ref 70–99)
Glucose-Capillary: 139 mg/dL — ABNORMAL HIGH (ref 70–99)
Glucose-Capillary: 146 mg/dL — ABNORMAL HIGH (ref 70–99)
Glucose-Capillary: 159 mg/dL — ABNORMAL HIGH (ref 70–99)

## 2020-11-06 NOTE — Progress Notes (Addendum)
NAME:  Sonia Drake, MRN:  [id number], DOB:  Jan 19, 1956, LOS: 36 ADMISSION DATE:  09/29/2020, CONSULTATION DATE:  6/15 REFERRING MD:  Thedore Mins, CHIEF COMPLAINT:  Dyspnea   History of Present Illness:  65 y/o female with anoxic brain injury, tracheostomy status who was admitted from a SNF on 6/12 with worsening respiratory failure.  She had previously been ventilator dependent but had been weaned from the trach.  On 6/16 a bronchoscopy revealed dynamic collapse of her trachea, tracheostomy was replaced.  Later required mechanical ventilation.   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 No distress. WBC ct trending down. No events overnight growing GPC clusters in two of two cultures  6/16 increased work of breathing, upper airway noise, abdominal muscle use.  Bronchoscopy performed as trach exchange cuffless #8 Shiley XLT showed dynamic airway collapse, tracheal collapse. 6/16 continued respiratory distress moved to ICU, changed to cuffed #6 Shiley XLT, placed on MV with some improved comfort 6/12 cefepime > 6/14, 6/18 6/12 vanc > 6/14, 6/18  6/23 mucus plugging, severe vent dyssynchrony 6/24-25 intermittent jerking, seizures on EEG 6/27 weaned off propofol gtt  6/28 briefly tolerated trach collar in AM 6/29 tolerated trach collar in AM; no further seizure like activity; working with CM/SW for vent-SNF 7/1 Weaning on CPAP/ PS 10/5, CM SW working on [employer], NO seizure activity, secretions are not an issue per nursing 7/11 trach changed  to #8 [place] with air cuff, much improved. 7/12 TCT 12 hours; rested on Vent overnight 7/13 on TCT   Interim History / Subjective:  Tmax 98.6  1L UOP +575 past 24 hours +8L admit  TC for 4 hours past 24 hours  Unable to obtain subjective evaluation due to patient status  Objective   Blood pressure (!) 134/90, pulse 100, temperature 97.7 F (36.5 C), temperature source Oral, resp. rate (!) 27, height 5\' 2"  (1.575 m), weight 91.4 kg, SpO2 96 %.    Vent Mode: PRVC FiO2 (%):  [28 %-30 %] 30 % Set Rate:  [20 bmp] 20 bmp Vt Set:  [400 mL] 400 mL PEEP:  [5 cmH20] 5 cmH20 Plateau Pressure:  [15 cmH20-20 cmH20] 16 cmH20   Intake/Output Summary (Last 24 hours) at 11/06/2020 0844 Last data filed at 11/06/2020 0000 Gross per 24 hour  Intake 1575 ml  Output 1075 ml  Net 500 ml   Filed Weights   11/04/20 0500 11/05/20 0500 11/06/20 0431  Weight: 90.7 kg 90 kg 91.4 kg    Examination: General:  in bed, NAD, ill appearing HEENT: MM pink/moist, anicteric, atraumatic, 8.0 trach c/d/i Neuro: PERRL 14mm, not tracking, opens eyes to stimulation CV: S1S2, NSR, no m/r/g appreciated PULM:   air movement in all lobes, Trachea midline, chest expansion symmetric GI: soft, bsx4 active, non distended Extremities: warm/dry, trace edema, capillary refill less than 3 seconds   Studies:  VQ scan 6/15> Neg for acute PE CT Head wo Contrast 6/24 > No definite acute intracranial abnormality, progressive cerebral atrophy w/chronic small vessel ischemic disease, acute L sphenoid sinusitis MRI Brain wo Contrast 6/28>  advanced atrophy, old Lt occipital infarct  Resolved  Hospital Problem list   Circulatory shock Hemoptysis  Assessment & Plan:   Acute on chronic hypoxic/hypercapnic respiratory failure in setting of anoxic encephalopathy, tracheobronchomalacia, and severe COPD. S/P biovana #8, legnth 140 on 7/11. Still requiring MV at night. TC from 1pm to 5pm yesterday. Still requiring MV at night.  Unlclear per RT why placed on PRVC overnight. P: -Continue trach collar trials -Mechanical venilator on standby for respiratory distress. If requiring MV overnight, place on CPAP/PS.  -Goal SAT 88-96% -Continue brovana, pulmicort, yupelri, singulair -Continue PRN albuterol -Continue VAP prevention -Continue trach care, tracheal suction as needed.  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 Seizure disorder. Hypertension. DM type 2. Hx of CVA. Obesity  P: -Per primary team   Labs:   CMP Latest Ref Rng & Units 11/03/2020 10/27/2020 10/26/2020  Glucose 70 - 99 mg/dL 99 - -  BUN 8 - 23 mg/dL 22 - -  Creatinine 6.26 - 1.00 mg/dL 9.48 - -  Sodium 546 - 145 mmol/L 138 141 141  Potassium 3.5 - 5.1 mmol/L 4.5 4.2 4.0  Chloride 98 - 111 mmol/L 103 - -  CO2 22 - 32 mmol/L 27 - -  Calcium 8.9 - 10.3 mg/dL 10.9(H) - -  Total Protein 6.5 - 8.1 g/dL 7.0 - -  Total Bilirubin 0.3 - 1.2 mg/dL 0.3 - -  Alkaline Phos 38 - 126 U/L 83 - -  AST 15 - 41 U/L 16 - -  ALT 0 - 44 U/L 14 - -    CBC Latest Ref Rng & Units 11/03/2020 10/27/2020 10/26/2020  WBC 4.0 - 10.5 K/uL 10.1 - -  Hemoglobin 12.0 - 15.0 g/dL 10.8(L) 10.5(L) 9.9(L)  Hematocrit 36.0 - 46.0 % 34.6(L) 31.0(L) 29.0(L)  Platelets 150 - 400 K/uL 379 - -    ABG    Component Value Date/Time   PHART 7.388 10/27/2020 0356   PCO2ART 50.4 (H) 10/27/2020 0356   PO2ART 127 (H) 10/27/2020 0356   HCO3 30.5 (H) 10/27/2020 0356   TCO2 32 10/27/2020 0356   O2SAT 99.0 10/27/2020 0356    CBG (last 3)  Recent Labs    11/05/20 2340 11/06/20 0333 11/06/20 0742  GLUCAP 122* 139* 132*    Signature:   Critical care time: N/A  Gershon Mussel., MSN, APRN, AGACNP-BC [facility]  11/06/2020 , 8:44 AM  Please see [email or web address] for pager details  If no response, please call [phone] After hours, please call Elink at [phone]

## 2020-11-06 NOTE — Progress Notes (Signed)
PROGRESS NOTE    CORTINA VULTAGGIO   VXB:[id number]  DOB: 09-Apr-1956  PCP: Garwin Brothers, MD    DOA: 10/06/2020 LOS: 36   Assessment & Plan   Principal Problem:   Sepsis ([place]) Active Problems:   COPD (chronic obstructive pulmonary disease) (HCC)   Chronic diastolic CHF (congestive heart failure) (HCC)   Acute on chronic respiratory failure with hypoxia (HCC)   GERD (gastroesophageal reflux disease)   Seizures ([place])   Type 2 diabetes mellitus without complication, with long-term current use of insulin (HCC)   Severe hypoxic-ischemic encephalopathy   Tracheostomy dependence ([place])   Pressure injury of skin   Chronic respiratory failure due to anoxic brain injury, dynamic airway collapse (due to tracheobronchomalacia) and advanced COPD 7/12 previously unable to wean to trach collar, another attempt today underway --PCCM following - see their recs -Continue full mechanical ventilation --Trach collar trials per pulmonary - Continue Brovana, Pulmicort, Singulair, Yupelri --maintain sats >92%    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 No improvement in mentation -Continue tizanidine, tbaclofen --On continuous tube feeds, RD following   Seizure disorder History of epilepsy prior to anoxic brain injury, worse since PEA arrest See summary 7/8 7/17: noted to have ?subtle seizure activity per bedside staff 7/18: CK level normal yesterday, less likely seizures, Depakote increased - Continue clonazepam 0.5 twice daily - Continue Keppra  - Continue Depakote - incrased to 1000 mg BID given low level  --Follow up pending Keppra level   Type 2 diabetes CBG's controlled. - Continue Lantus and Novolog + SSI   Hypertension - currently with soft BP's - Continue metoprolol with hold parameters   Advanced COPD FEV1 2019 was 38% No wheezing - not exacerbated at this time -Continue LABA/LAMA, ICS - Continue Singulair, Claritin,  famotidine  Cerebrovascular disease, secondary prevention -Continue Plavix   Obesity BMI 36   Resolved issues: Positive blood cultures Blood cultures on admission growing actually 3 different species of coag negative staph, appear to all be contaminants.   Hypokalemia Resolved    Obesity: Body mass index is 36.85 kg/m.  Complicates overall care and prognosis.  Recommend lifestyle modifications including physical activity and diet for weight loss and overall long-term health.   DVT prophylaxis:    Diet:  Diet Orders (From admission, onward)     Start     Ordered   09/30/2020 1610  Diet NPO time specified  Diet effective now        10/12/2020 1610              Code Status: Full Code   Brief Narrative / Hospital Course to Date:   Sonia Drake is a 65 y.o. F with DM, COPD, seizures, and anoxic brain injury due to PEA cardiac arrest Aug 2021 at [facility] now in with persistent vegetative state (opens eyes but does not follow commands), tracheostomy dependent who presented with respiratory distress from SNF.  6/12 admitted from skilled nursing facility after reported aspiration event, copious secretions, and respiratory distress; working diagnosis was sepsis from CXR-negative aspiration pneumonia and abx were started 6/13 appeared comfortable on 35% ATC. No distress. WBC ct trending down.  Blood cultures growing 2 different CoNS, likely contaminant 6/14 abx stopped, no further fever 6/16 increased work of breathing, upper airway noise, abdominal muscle use.  Bronchoscopy performed as trach exchange cuffless #8 Shiley XLT showed dynamic airway collapse, tracheal collapse. VQ scan 6/15> Neg for acute PE 6/16 continued respiratory distress moved to  ICU, changed to cuffed #6 Shiley XLT, placed on MV with some improved comfort Vanc restarted 6/18 to 6/19, then stopped, no further fever, WBC normalized off abx.    6/23 mucus plugging, severe vent dyssynchrony 6/24-25 intermittent  jerking, seizures on EEG propofol restarted 6/27 weaned off propofol gtt 6/28 briefly tolerated trach collar in AM 6/29 tolerated trach collar in AM; no further seizure like activity; working with CM/SW for vent-SNF 7/1 Weaning on CPAP/ PS 10/5, CM SW working on [employer], NO seizure activity, secretions are not an issue per nursing 7/1 - 7/5 has remained stable 7/5-7/11 has consistently failed to wean to trach collar  Subjective 11/06/20    Pt seen this AM. No acute events reported.  Non-verbal, non-responsive to any stimulus.  Appears in no distress.   Disposition Plan & Communication   Status is: Inpatient  Remains inpatient appropriate because:Inpatient level of care appropriate due to severity of illness.  Requires LTAC or vent-capable SNF.  Dispo: The patient is from: SNF              Anticipated d/c is to: LTAC vs SNF-vent              Patient currently is not medically stable to d/c.   Difficult to place patient Yes  Prognosis is extremely poor for any meaningful improvement.  Recommend comfort care measures. Family have wanted to continue full scope of care.   Consults, Procedures, Significant Events   Consultants:  PCCM  Procedures:  As above, significant events  Antimicrobials:  Anti-infectives (From admission, onward)    Start     Dose/Rate Route Frequency Ordered Stop   10/07/20 1400  vancomycin (VANCOREADY) IVPB 1500 mg/300 mL  Status:  Discontinued        1,500 mg 150 mL/hr over 120 Minutes Intravenous Every 24 hours 10/07/20 1223 10/09/20 0829   10/07/20 1200  ceFEPIme (MAXIPIME) 2 g in sodium chloride 0.9 % 100 mL IVPB  Status:  Discontinued        2 g 200 mL/hr over 30 Minutes Intravenous Every 8 hours 10/07/20 0828 10/07/20 1221   10/02/20 1400  vancomycin (VANCOREADY) IVPB 1250 mg/250 mL  Status:  Discontinued        1,250 mg 166.7 mL/hr over 90 Minutes Intravenous Every 24 hours 10/19/2020 1313 10/04/20 1221   09/22/2020 2200  ceFEPIme (MAXIPIME) 2 g  in sodium chloride 0.9 % 100 mL IVPB  Status:  Discontinued        2 g 200 mL/hr over 30 Minutes Intravenous Every 8 hours 09/25/2020 1313 10/04/20 1221   09/28/2020 2200  metroNIDAZOLE (FLAGYL) IVPB 500 mg  Status:  Discontinued        500 mg 100 mL/hr over 60 Minutes Intravenous Every 8 hours 10/13/2020 1616 10/04/20 1221   09/26/2020 1330  vancomycin (VANCOREADY) IVPB 2000 mg/400 mL        2,000 mg 200 mL/hr over 120 Minutes Intravenous  Once 10/19/2020 1257 10/04/2020 1550   10/09/2020 1300  ceFEPIme (MAXIPIME) 2 g in sodium chloride 0.9 % 100 mL IVPB        2 g 200 mL/hr over 30 Minutes Intravenous  Once 10/10/2020 1257 09/20/2020 1330   09/26/2020 1300  metroNIDAZOLE (FLAGYL) IVPB 500 mg        500 mg 100 mL/hr over 60 Minutes Intravenous  Once 10/16/2020 1257 10/17/2020 1443         Micro    Objective   Vitals:   11/06/20  1119 11/06/20 1146 11/06/20 1516 11/06/20 1539  BP:      Pulse:  (!) 119  88  Resp:  (!) 23  18  Temp:  98.4 F (36.9 C) (!) 97.4 F (36.3 C)   TempSrc:  Oral Oral   SpO2: 95% 95%  97%  Weight:      Height:        Intake/Output Summary (Last 24 hours) at 11/06/2020 1616 Last data filed at 11/06/2020 1139 Gross per 24 hour  Intake 570 ml  Output 775 ml  Net -205 ml   Filed Weights   11/04/20 0500 11/05/20 0500 11/06/20 0431  Weight: 90.7 kg 90 kg 91.4 kg    Physical Exam:  General exam: eyes closed, unresponsive, no acute distress, obese HEENT; no REM's seen, forehead diaphoretic Respiratory system: on vent 30% fio2, normal respiratory effort, rhonchi anteriorly Cardiovascular system: normal S1/S2, RRR Gastrointestinal system: soft, G tube present, TF's running Extremities: b/l hands and UE edema, no LE edema    Labs   Data Reviewed: I have personally reviewed following labs and imaging studies  CBC: Recent Labs  Lab 11/03/20 0630  WBC 10.1  NEUTROABS 5.1  HGB 10.8*  HCT 34.6*  MCV 88.3  PLT 491   Basic Metabolic Panel: Recent Labs  Lab  11/03/20 0630  NA 138  K 4.5  CL 103  CO2 27  GLUCOSE 99  BUN 22  CREATININE 0.46  CALCIUM 10.9*   GFR: Estimated Creatinine Clearance: 72.7 mL/min (by C-G formula based on SCr of 0.46 mg/dL). Liver Function Tests: Recent Labs  Lab 11/03/20 0630  AST 16  ALT 14  ALKPHOS 83  BILITOT 0.3  PROT 7.0  ALBUMIN 2.7*   No results for input(s): LIPASE, AMYLASE in the last 168 hours. No results for input(s): AMMONIA in the last 168 hours. Coagulation Profile: No results for input(s): INR, PROTIME in the last 168 hours. Cardiac Enzymes: Recent Labs  Lab 11/05/20 1055  CKTOTAL 226   BNP (last 3 results) No results for input(s): PROBNP in the last 8760 hours. HbA1C: No results for input(s): HGBA1C in the last 72 hours. CBG: Recent Labs  Lab 11/05/20 2340 11/06/20 0333 11/06/20 0742 11/06/20 1144 11/06/20 1509  GLUCAP 122* 139* 132* 146* 159*   Lipid Profile: No results for input(s): CHOL, HDL, LDLCALC, TRIG, CHOLHDL, LDLDIRECT in the last 72 hours. Thyroid Function Tests: No results for input(s): TSH, T4TOTAL, FREET4, T3FREE, THYROIDAB in the last 72 hours. Anemia Panel: No results for input(s): VITAMINB12, FOLATE, FERRITIN, TIBC, IRON, RETICCTPCT in the last 72 hours. Sepsis Labs: No results for input(s): PROCALCITON, LATICACIDVEN in the last 168 hours.  No results found for this or any previous visit (from the past 240 hour(s)).     Imaging Studies   No results found.   Medications   Scheduled Meds:  arformoterol  15 mcg Nebulization BID   baclofen  5 mg Per Tube TID   budesonide  0.5 mg Nebulization BID   chlorhexidine gluconate (MEDLINE KIT)  15 mL Mouth Rinse BID   Chlorhexidine Gluconate Cloth  6 each Topical Daily   clonazePAM  0.5 mg Per Tube BID   clopidogrel  75 mg Per Tube Daily   enoxaparin (LOVENOX) injection  0.5 mg/kg Subcutaneous Q24H   famotidine  20 mg Per Tube BID   feeding supplement (PROSource TF)  45 mL Per Tube BID   fiber  1  packet Per Tube BID   Gerhardt's butt cream  Topical BID   guaiFENesin  5 mL Per Tube Q6H   hydrocortisone cream   Topical TID   insulin aspart  0-15 Units Subcutaneous Q4H   insulin aspart  5 Units Subcutaneous Q4H   insulin glargine  40 Units Subcutaneous QHS   levETIRAcetam  2,500 mg Per Tube BID   loratadine  10 mg Per Tube Daily   mouth rinse  15 mL Mouth Rinse 10 times per day   metoprolol tartrate  12.5 mg Per Tube BID   montelukast  10 mg Per Tube Daily   nutrition supplement (JUVEN)  1 packet Per Tube BID   revefenacin  175 mcg Nebulization Daily   tiZANidine  2 mg Per Tube QHS   valproic acid  1,000 mg Per Tube Q12H   Continuous Infusions:  sodium chloride     feeding supplement (JEVITY 1.5 CAL/FIBER) 1,000 mL (11/05/20 0633)       LOS: 36 days    Time spent: 25 minutes with > 50% spent at bedside and in coordination of care.    Sonia Slocumb, DO [facility]  11/06/2020, 4:16 PM      If 7PM-7AM, please contact night-coverage. How to contact the [facility] Attending or Consulting provider [place] or covering provider during after hours [place], for this patient?    Check the care team in [facility] and look for a) attending/consulting [facility] provider listed and b) the [facility] team listed Log into [email or web address] and use [place]'s universal password to access. If you do not have the password, please contact the hospital operator. Locate the [facility] provider you are looking for under [facility] and page to a number that you can be directly reached. If you still have difficulty reaching the provider, please page the [employer] (Director on Call) for the Hospitalists listed on amion for assistance.

## 2020-11-06 NOTE — Progress Notes (Signed)
Attempted ATC .28 Pt did not tolerate.  Increased WOB, Accessory muscle use.  Placed back on vent CPAP/PS 5/5 Pt continued with increased WOB and accessory muscle use with RR 46.  Placed back in full support will attempt wean at later time today.

## 2020-11-07 DIAGNOSIS — I5032 Chronic diastolic (congestive) heart failure: Secondary | ICD-10-CM | POA: Diagnosis not present

## 2020-11-07 DIAGNOSIS — J9621 Acute and chronic respiratory failure with hypoxia: Secondary | ICD-10-CM | POA: Diagnosis not present

## 2020-11-07 DIAGNOSIS — J449 Chronic obstructive pulmonary disease, unspecified: Secondary | ICD-10-CM | POA: Diagnosis not present

## 2020-11-07 LAB — CBC
HCT: 33.1 % — ABNORMAL LOW (ref 36.0–46.0)
Hemoglobin: 10 g/dL — ABNORMAL LOW (ref 12.0–15.0)
MCH: 26.5 pg (ref 26.0–34.0)
MCHC: 30.2 g/dL (ref 30.0–36.0)
MCV: 87.8 fL (ref 80.0–100.0)
Platelets: 360 10*3/uL (ref 150–400)
RBC: 3.77 MIL/uL — ABNORMAL LOW (ref 3.87–5.11)
RDW: 17 % — ABNORMAL HIGH (ref 11.5–15.5)
WBC: 9.7 10*3/uL (ref 4.0–10.5)
nRBC: 0 % (ref 0.0–0.2)

## 2020-11-07 LAB — GLUCOSE, CAPILLARY
Glucose-Capillary: 114 mg/dL — ABNORMAL HIGH (ref 70–99)
Glucose-Capillary: 120 mg/dL — ABNORMAL HIGH (ref 70–99)
Glucose-Capillary: 121 mg/dL — ABNORMAL HIGH (ref 70–99)
Glucose-Capillary: 128 mg/dL — ABNORMAL HIGH (ref 70–99)
Glucose-Capillary: 147 mg/dL — ABNORMAL HIGH (ref 70–99)
Glucose-Capillary: 99 mg/dL (ref 70–99)

## 2020-11-07 LAB — BASIC METABOLIC PANEL
Anion gap: 8 (ref 5–15)
BUN: 24 mg/dL — ABNORMAL HIGH (ref 8–23)
CO2: 27 mmol/L (ref 22–32)
Calcium: 10.8 mg/dL — ABNORMAL HIGH (ref 8.9–10.3)
Chloride: 104 mmol/L (ref 98–111)
Creatinine, Ser: 0.53 mg/dL (ref 0.44–1.00)
GFR, Estimated: >60 mL/min (ref >60)
Glucose, Bld: 114 mg/dL — ABNORMAL HIGH (ref 70–99)
Potassium: 4.4 mmol/L (ref 3.5–5.1)
Sodium: 139 mmol/L (ref 135–145)

## 2020-11-07 NOTE — Progress Notes (Signed)
Nutrition Follow-up  DOCUMENTATION CODES:   Not applicable  INTERVENTION:   Continue tube feeds via PEG: - Jevity 1.5 @ 45 ml/hr (1080 ml/day) - ProSource TF 45 ml BID  Tube feeding regimen provides 1700 kcal, 91 grams of protein, and 821 ml of H2O.  - Continue Juven BID via PEG, each packet provides 80 calories, 8 grams of carbohydrate, 2.5  grams of protein, 7 grams of L-arginine and 7 grams of L-glutamine; supplement contains CaHMB, vitamins C, E, B12 and zinc to promote wound healing  NUTRITION DIAGNOSIS:   Increased nutrient needs related to wound healing as evidenced by estimated needs.  Ongoing, being addressed via TF  GOAL:   Patient will meet greater than or equal to 90% of their needs  Met via TF  MONITOR:   Vent status, Labs, Weight trends, TF tolerance, Skin, I & O's  REASON FOR ASSESSMENT:   Consult Enteral/tube feeding initiation and management  ASSESSMENT:   LOWELL MCGURK is a 65 y.o. female with medical history significant of asthma/COPD, CHF, cardiac arrest with PEA/CPR with chronic respiratory failure on permanent trach with supplemental oxygen at 6L, type 2 DM, GERD, seizures and severe hypoxic-ischemic encephalopathy who presented to ER via EMS for respiratory distress from SNF.  Discussed pt with RN and during ICU rounds. Pt currently on trach collar but requiring vent at night. Pt continues with trach collar trials as able but not tolerating for extended periods of time per notes. Pt tolerating tube feeds without issue.  Admit weight: 88.4 kg Current weight: 90.7 kg  Per I/O's flowsheet, pt with +22.3 L since admission. Question accuracy given UOP does not appear to be fully documented. Pt with moderate pitting edema to BUE and non-pitting edema to BLE.  Current TF: Jevity 1.5 @ 45 ml/hr, ProSource TF 45 ml BID  Medications reviewed and include: pepcid, nutrisource fiber BID, SSI q 4 hours, novolog 5 units q 4 hours, lantus 40 units daily,  Juven BID  Labs reviewed: BUN 24, hemoglobin 10.0 CBG's: 114-147 x 24 hours  UOP: 600 ml x 12 hours I/O's: +22.3 L since admit  Diet Order:   Diet Order             Diet NPO time specified  Diet effective now                   EDUCATION NEEDS:   No education needs have been identified at this time  Skin:  Skin Assessment: Skin Integrity Issues: Stage II: buttocks Other: MASD to buttocks  Last BM:  11/07/20 medium type 5  Height:   Ht Readings from Last 1 Encounters:  10/12/20 5' 2"  (1.575 m)    Weight:   Wt Readings from Last 1 Encounters:  11/07/20 89.8 kg    Ideal Body Weight:  45.5 kg  BMI:  Body mass index is 36.21 kg/m.  Estimated Nutritional Needs:   Kcal:  1600-1800  Protein:  90-105 grams  Fluid:  > 1.6 L    Gustavus Bryant, MS, RD, LDN Inpatient Clinical Dietitian Please see AMiON for contact information.

## 2020-11-07 NOTE — Progress Notes (Signed)
PROGRESS NOTE    [facility]   PPJ:[id number]  DOB: 07-Jan-1956  PCP: Sonia Brothers, MD    DOA: 10/11/2020 LOS: 75   Assessment & Plan   Principal Problem:   Sepsis ([place]) Active Problems:   COPD (chronic obstructive pulmonary disease) ([place])   Chronic diastolic CHF (congestive heart failure) (HCC)   Acute on chronic respiratory failure with hypoxia (HCC)   GERD (gastroesophageal reflux disease)   Seizures ([place])   Type 2 diabetes mellitus without complication, with long-term current use of insulin (HCC)   Severe hypoxic-ischemic encephalopathy   Tracheostomy dependence ([place])   Pressure injury of skin   Chronic respiratory failure due to anoxic brain injury, dynamic airway collapse (due to tracheobronchomalacia) and advanced COPD 7/12 previously unable to wean to trach collar, another attempt today underway -- PCCM following - see their recs - Continue full mechanical ventilation --Trach collar trials per pulmonary - Continue Brovana, Pulmicort, Singulair, Yupelri --maintain sats >92%    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 No improvement in mentation -Continue tizanidine, baclofen --On continuous tube feeds, RD following  Bilateral upper extremity edema /trace lower extremity edema -  Diuresis currently limited by hypotension. Edema appears due to third-spacing; despite +fluid balance and edema, pt has had stable O2 requirements.   Seizure disorder History of epilepsy prior to anoxic brain injury, worse since PEA arrest See summary 7/8 7/17: noted to have ?subtle seizure activity per bedside staff 7/18: CK level normal yesterday, less likely seizures, Depakote increased - Continue clonazepam 0.5 twice daily - Continue Keppra  - Continue Depakote - incrased to 1000 mg BID given low level  --Follow up pending Keppra level   Type 2 diabetes CBG's  controlled. - Continue Lantus and Novolog + SSI   Hypertension - currently with soft BP's - Continue metoprolol with hold parameters   Advanced COPD FEV1 2019 was 38% No wheezing - not exacerbated at this time -Continue LABA/LAMA, ICS - Continue Singulair, Claritin, famotidine  Cerebrovascular disease, secondary prevention -Continue Plavix   Obesity BMI 36   Resolved issues: Positive blood cultures Blood cultures on admission growing actually 3 different species of coag negative staph, appear to all be contaminants.   Hypokalemia Resolved    Obesity: Body mass index is 36.21 kg/m.  Complicates overall care and prognosis.  Recommend lifestyle modifications including physical activity and diet for weight loss and overall long-term health.   DVT prophylaxis:    Diet:  Diet Orders (From admission, onward)     Start     Ordered   09/23/2020 1610  Diet NPO time specified  Diet effective now        10/11/2020 1610              Code Status: Full Code   Brief Narrative / Hospital Course to Date:   [name] is a 65 y.o. F with DM, COPD, seizures, and anoxic brain injury due to PEA cardiac arrest Aug 2021 at [facility] now in with persistent vegetative state (opens eyes but does not follow commands), tracheostomy dependent who presented with respiratory distress from SNF.  6/12 admitted from skilled nursing facility after reported aspiration event, copious secretions, and respiratory distress; working diagnosis was sepsis from CXR-negative aspiration pneumonia and abx were started 6/13 appeared comfortable on 35% ATC. No distress. WBC ct trending down.  Blood cultures growing 2 different CoNS, likely contaminant 6/14  abx stopped, no further fever 6/16 increased work of breathing, upper airway noise, abdominal muscle use.  Bronchoscopy performed as trach exchange cuffless #8 Shiley XLT showed dynamic airway collapse, tracheal collapse. VQ scan 6/15> Neg for acute PE 6/16  continued respiratory distress moved to ICU, changed to cuffed #6 Shiley XLT, placed on MV with some improved comfort Vanc restarted 6/18 to 6/19, then stopped, no further fever, WBC normalized off abx.    6/23 mucus plugging, severe vent dyssynchrony 6/24-25 intermittent jerking, seizures on EEG propofol restarted 6/27 weaned off propofol gtt 6/28 briefly tolerated trach collar in AM 6/29 tolerated trach collar in AM; no further seizure like activity; working with CM/SW for vent-SNF 7/1 Weaning on CPAP/ PS 10/5, CM SW working on [employer], NO seizure activity, secretions are not an issue per nursing 7/1 - 7/5 has remained stable 7/5-7/11 has consistently failed to wean to trach collar  Subjective 11/07/20    Patient seen this morning in ICU.  She is on trach collar in appears tolerating so far, appears was just recently put on.  Discussed with bedside RN and no acute events reported.  Patient noted to have hypotension yesterday evening and overnight with low 70/49 around 1 AM.   Disposition Plan & Communication   Status is: Inpatient  Remains inpatient appropriate because:Inpatient level of care appropriate due to severity of illness.  Requires LTAC or vent-capable SNF.  Dispo: The patient is from: SNF              Anticipated d/c is to: LTAC vs SNF-vent              Patient currently is not medically stable to d/c.   Difficult to place patient Yes  Prognosis is extremely poor for any meaningful improvement.  Recommend comfort care measures. Family have wanted to continue full scope of care.   Consults, Procedures, Significant Events   Consultants:  PCCM  Procedures:  As above, significant events  Antimicrobials:  Anti-infectives (From admission, onward)    Start     Dose/Rate Route Frequency Ordered Stop   10/07/20 1400  vancomycin (VANCOREADY) IVPB 1500 mg/300 mL  Status:  Discontinued        1,500 mg 150 mL/hr over 120 Minutes Intravenous Every 24 hours 10/07/20 1223  10/09/20 0829   10/07/20 1200  ceFEPIme (MAXIPIME) 2 g in sodium chloride 0.9 % 100 mL IVPB  Status:  Discontinued        2 g 200 mL/hr over 30 Minutes Intravenous Every 8 hours 10/07/20 0828 10/07/20 1221   10/02/20 1400  vancomycin (VANCOREADY) IVPB 1250 mg/250 mL  Status:  Discontinued        1,250 mg 166.7 mL/hr over 90 Minutes Intravenous Every 24 hours 09/30/2020 1313 10/04/20 1221   10/09/2020 2200  ceFEPIme (MAXIPIME) 2 g in sodium chloride 0.9 % 100 mL IVPB  Status:  Discontinued        2 g 200 mL/hr over 30 Minutes Intravenous Every 8 hours 10/08/2020 1313 10/04/20 1221   10/09/2020 2200  metroNIDAZOLE (FLAGYL) IVPB 500 mg  Status:  Discontinued        500 mg 100 mL/hr over 60 Minutes Intravenous Every 8 hours 10/16/2020 1616 10/04/20 1221   10/15/2020 1330  vancomycin (VANCOREADY) IVPB 2000 mg/400 mL        2,000 mg 200 mL/hr over 120 Minutes Intravenous  Once 09/25/2020 1257 09/30/2020 1550   10/09/2020 1300  ceFEPIme (MAXIPIME) 2 g in sodium chloride 0.9 %  100 mL IVPB        2 g 200 mL/hr over 30 Minutes Intravenous  Once 10/10/2020 1257 09/28/2020 1330   10/05/2020 1300  metroNIDAZOLE (FLAGYL) IVPB 500 mg        500 mg 100 mL/hr over 60 Minutes Intravenous  Once 10/02/2020 1257 09/29/2020 1443         Micro    Objective   Vitals:   11/07/20 1100 11/07/20 1113 11/07/20 1129 11/07/20 1200  BP: 99/61   102/63  Pulse: 92 94  92  Resp:  (!) 26  (!) 25  Temp:   98.4 F (36.9 C)   TempSrc:   Oral   SpO2:  98%  98%  Weight:      Height:        Intake/Output Summary (Last 24 hours) at 11/07/2020 1451 Last data filed at 11/07/2020 1300 Gross per 24 hour  Intake 1010 ml  Output 1025 ml  Net -15 ml   Filed Weights   11/05/20 0500 11/06/20 0431 11/07/20 0407  Weight: 90 kg 91.4 kg 89.8 kg    Physical Exam:  General exam: unresponsive but awake, no acute distress, obese Respiratory system: on trach collar 5 L/min, rhonchi, tachypneic  Cardiovascular system: normal S1/S2,  RRR Gastrointestinal system: mildly distended, no painful response to palpation, G tube present, TF's running Extremities: stable b/l hands and UE edema, no LE edema Skin: dry, intact, normal temp    Labs   Data Reviewed: I have personally reviewed following labs and imaging studies  CBC: Recent Labs  Lab 11/03/20 0630 11/07/20 0646  WBC 10.1 9.7  NEUTROABS 5.1  --   HGB 10.8* 10.0*  HCT 34.6* 33.1*  MCV 88.3 87.8  PLT 379 831   Basic Metabolic Panel: Recent Labs  Lab 11/03/20 0630 11/07/20 0646  NA 138 139  K 4.5 4.4  CL 103 104  CO2 27 27  GLUCOSE 99 114*  BUN 22 24*  CREATININE 0.46 0.53  CALCIUM 10.9* 10.8*   GFR: Estimated Creatinine Clearance: 72.1 mL/min (by C-G formula based on SCr of 0.53 mg/dL). Liver Function Tests: Recent Labs  Lab 11/03/20 0630  AST 16  ALT 14  ALKPHOS 83  BILITOT 0.3  PROT 7.0  ALBUMIN 2.7*   No results for input(s): LIPASE, AMYLASE in the last 168 hours. No results for input(s): AMMONIA in the last 168 hours. Coagulation Profile: No results for input(s): INR, PROTIME in the last 168 hours. Cardiac Enzymes: Recent Labs  Lab 11/05/20 1055  CKTOTAL 226   BNP (last 3 results) No results for input(s): PROBNP in the last 8760 hours. HbA1C: No results for input(s): HGBA1C in the last 72 hours. CBG: Recent Labs  Lab 11/06/20 1919 11/06/20 2339 11/07/20 0325 11/07/20 0739 11/07/20 1126  GLUCAP 127* 135* 128* 147* 114*   Lipid Profile: No results for input(s): CHOL, HDL, LDLCALC, TRIG, CHOLHDL, LDLDIRECT in the last 72 hours. Thyroid Function Tests: No results for input(s): TSH, T4TOTAL, FREET4, T3FREE, THYROIDAB in the last 72 hours. Anemia Panel: No results for input(s): VITAMINB12, FOLATE, FERRITIN, TIBC, IRON, RETICCTPCT in the last 72 hours. Sepsis Labs: No results for input(s): PROCALCITON, LATICACIDVEN in the last 168 hours.  No results found for this or any previous visit (from the past 240 hour(s)).      Imaging Studies   No results found.   Medications   Scheduled Meds:  arformoterol  15 mcg Nebulization BID   baclofen  5 mg Per Tube  TID   budesonide  0.5 mg Nebulization BID   chlorhexidine gluconate (MEDLINE KIT)  15 mL Mouth Rinse BID   Chlorhexidine Gluconate Cloth  6 each Topical Daily   clonazePAM  0.5 mg Per Tube BID   clopidogrel  75 mg Per Tube Daily   enoxaparin (LOVENOX) injection  0.5 mg/kg Subcutaneous Q24H   famotidine  20 mg Per Tube BID   feeding supplement (PROSource TF)  45 mL Per Tube BID   fiber  1 packet Per Tube BID   Gerhardt's butt cream   Topical BID   guaiFENesin  5 mL Per Tube Q6H   hydrocortisone cream   Topical TID   insulin aspart  0-15 Units Subcutaneous Q4H   insulin aspart  5 Units Subcutaneous Q4H   insulin glargine  40 Units Subcutaneous QHS   levETIRAcetam  2,500 mg Per Tube BID   loratadine  10 mg Per Tube Daily   mouth rinse  15 mL Mouth Rinse 10 times per day   metoprolol tartrate  12.5 mg Per Tube BID   montelukast  10 mg Per Tube Daily   nutrition supplement (JUVEN)  1 packet Per Tube BID   revefenacin  175 mcg Nebulization Daily   tiZANidine  2 mg Per Tube QHS   valproic acid  1,000 mg Per Tube Q12H   Continuous Infusions:  sodium chloride     feeding supplement (JEVITY 1.5 CAL/FIBER) 1,000 mL (11/07/20 1035)       LOS: 37 days    Time spent: 25 minutes with > 50% spent at bedside and in coordination of care.    Ezekiel Slocumb, DO [facility]  11/07/2020, 2:51 PM      If 7PM-7AM, please contact night-coverage. How to contact the [facility] Attending or Consulting provider [place] or covering provider during after hours [place], for this patient?    Check the care team in [facility] and look for a) attending/consulting [facility] provider listed and b) the [facility] team listed Log into [email or web address] and use [place] universal password to access. If you do not have the password, please contact the hospital operator. Locate  the [facility] provider you are looking for under [facility] and page to a number that you can be directly reached. If you still have difficulty reaching the provider, please page the [facility] (Director on Call) for the Hospitalists listed on amion for assistance.

## 2020-11-08 DIAGNOSIS — I5032 Chronic diastolic (congestive) heart failure: Secondary | ICD-10-CM | POA: Diagnosis not present

## 2020-11-08 DIAGNOSIS — J438 Other emphysema: Secondary | ICD-10-CM | POA: Diagnosis not present

## 2020-11-08 DIAGNOSIS — J9621 Acute and chronic respiratory failure with hypoxia: Secondary | ICD-10-CM | POA: Diagnosis not present

## 2020-11-08 DIAGNOSIS — R569 Unspecified convulsions: Secondary | ICD-10-CM | POA: Diagnosis not present

## 2020-11-08 LAB — GLUCOSE, CAPILLARY
Glucose-Capillary: 108 mg/dL — ABNORMAL HIGH (ref 70–99)
Glucose-Capillary: 119 mg/dL — ABNORMAL HIGH (ref 70–99)
Glucose-Capillary: 122 mg/dL — ABNORMAL HIGH (ref 70–99)
Glucose-Capillary: 123 mg/dL — ABNORMAL HIGH (ref 70–99)
Glucose-Capillary: 128 mg/dL — ABNORMAL HIGH (ref 70–99)
Glucose-Capillary: 146 mg/dL — ABNORMAL HIGH (ref 70–99)

## 2020-11-08 LAB — VALPROIC ACID LEVEL: Valproic Acid Lvl: 56 ug/mL (ref 50.0–100.0)

## 2020-11-08 NOTE — Progress Notes (Signed)
PROGRESS NOTE    [facility]  EXH:[id number] DOB: 1955/11/30 DOA: 10/15/2020 PCP: Garwin Brothers, MD    Chief Complaint  Patient presents with   Respiratory Distress    Brief Narrative:  65 y.o from SNF , Hx of DM2, CHF, h/o cardiac arrest in 11/2019 result in severe anoxic brain injury, s/p PEG tube, s/p chronic trach presents with acute on chronic respiratory failure.  Bronchoscopy performed on 6/16 showed dynamic airway collapse, tracheal collapsenow vent-dependent, not able to return to SNF  Subjective:  She opens eyes briefly to voice, does not follow command She is on trach collar during the day, on vent at night She is tolerating tube feeds  Assessment & Plan:   Principal Problem:   Sepsis ([place]) Active Problems:   COPD (chronic obstructive pulmonary disease) (HCC)   Chronic diastolic CHF (congestive heart failure) (HCC)   Acute on chronic respiratory failure with hypoxia (HCC)   GERD (gastroesophageal reflux disease)   Seizures (HCC)   Type 2 diabetes mellitus without complication, with long-term current use of insulin (HCC)   Severe hypoxic-ischemic encephalopathy   Tracheostomy dependence ([place])   Pressure injury of skin   Acute on chronic hypoxic respiratory failure Likely due to dynamic airway collapse, also has advanced COPD ,now vent dependent at night There is a concern of aspiration pneumonia [name] /sepsis on presentation, ID consulted think infection is less likely with reassuring procalcitonin, positive blood culture likely contamination due to bedbound status , she was briefly on antibiotic for 4 days initially, antibiotic discontinued per ID recommendation  Anoxic brain injury, vegetative state ,bedbound status, nonverbal at baseline Status post trach and PEG She is seen by neurology does not appear to have meaningful recovery  Seizure disorders, with history of epilepsy prior to anoxic brain injury, worse since PEA arrest She is evaluated by  neurology, was briefly on propofol drip Had several EEG study She is currently on clonazepam, Keppra, valproic acid Valproic acid level 56 on 7/20  Insulin-dependent type 2 diabetes A1c 7.0 Blood glucose stable on current regimen  History of CVA Continue Plavix  Hypertension BP soft on low-dose metoprolol Monitor  Chronic diastolic CHF With preserved left ventricular EF She does has edema on exam today, volume overload/third spacing , diuresis is limited by low blood pressure   Obesity: Body mass index is 36.53 kg/m.Marland Kitchen Seen by dietician.  I agree with the assessment and plan as outlined below: Nutrition Status: Nutrition Problem: Increased nutrient needs Etiology: wound healing Signs/Symptoms: estimated needs Interventions: Tube feeding  . Sacral wound, present on admission  moisture related seen by wound care  Clean the sacrum with soap and water, rinse, pat dry. Place a piece of Xeroform gauze on the open wounds and secure with foam dressing. Change daily  Failure to thrive, poor prognosis, seen by palliative care, remain full code, palliative care signed off  Unresulted Labs (From admission, onward)     Start     Ordered   11/09/20 0500  Cortisol  Tomorrow morning,   R       Question:  Specimen collection method  Answer:  Lab=Lab collect   11/08/20 1929   11/09/20 0500  CBC with Differential/Platelet  Tomorrow morning,   R       Question:  Specimen collection method  Answer:  Lab=Lab collect   11/08/20 1929   11/09/20 3810  Basic metabolic panel  Tomorrow morning,   R       Question:  Specimen collection method  Answer:  Lab=Lab collect   11/08/20 1929   11/09/20 0500  Magnesium  Tomorrow morning,   R       Question:  Specimen collection method  Answer:  Lab=Lab collect   11/08/20 1929   11/09/20 0500  Phosphorus  Tomorrow morning,   R       Question:  Specimen collection method  Answer:  Lab=Lab collect   11/08/20 1930   11/05/20 1004  Levetiracetam level   Once,   R       Question:  Specimen collection method  Answer:  Lab=Lab collect   11/05/20 1003              DVT prophylaxis:   Lovenox   Code Status: Full Family Communication: None at bedside Disposition:   Status is: Inpatient   Dispo: The patient is from: Skilled nursing facility              Anticipated d/c is to: To be determined              Anticipated d/c date is: Appear to be difficult placement                Consultants:  Pulmonary critical care Infectious disease Palliative care Neurology  Procedures:  Bronchoscopy on 6/16, 6/23 EEG 6/25, 6/27, 6/28  Antimicrobials:    Anti-infectives (From admission, onward)    Start     Dose/Rate Route Frequency Ordered Stop   10/07/20 1400  vancomycin (VANCOREADY) IVPB 1500 mg/300 mL  Status:  Discontinued        1,500 mg 150 mL/hr over 120 Minutes Intravenous Every 24 hours 10/07/20 1223 10/09/20 0829   10/07/20 1200  ceFEPIme (MAXIPIME) 2 g in sodium chloride 0.9 % 100 mL IVPB  Status:  Discontinued        2 g 200 mL/hr over 30 Minutes Intravenous Every 8 hours 10/07/20 0828 10/07/20 1221   10/02/20 1400  vancomycin (VANCOREADY) IVPB 1250 mg/250 mL  Status:  Discontinued        1,250 mg 166.7 mL/hr over 90 Minutes Intravenous Every 24 hours 09/25/2020 1313 10/04/20 1221   10/18/2020 2200  ceFEPIme (MAXIPIME) 2 g in sodium chloride 0.9 % 100 mL IVPB  Status:  Discontinued        2 g 200 mL/hr over 30 Minutes Intravenous Every 8 hours 10/12/2020 1313 10/04/20 1221   09/21/2020 2200  metroNIDAZOLE (FLAGYL) IVPB 500 mg  Status:  Discontinued        500 mg 100 mL/hr over 60 Minutes Intravenous Every 8 hours 09/29/2020 1616 10/04/20 1221   10/13/2020 1330  vancomycin (VANCOREADY) IVPB 2000 mg/400 mL        2,000 mg 200 mL/hr over 120 Minutes Intravenous  Once 10/06/2020 1257 10/04/2020 1550   10/08/2020 1300  ceFEPIme (MAXIPIME) 2 g in sodium chloride 0.9 % 100 mL IVPB        2 g 200 mL/hr over 30 Minutes Intravenous  Once  09/29/2020 1257 10/15/2020 1330   10/18/2020 1300  metroNIDAZOLE (FLAGYL) IVPB 500 mg        500 mg 100 mL/hr over 60 Minutes Intravenous  Once 09/29/2020 1257 09/24/2020 1443          Objective: Vitals:   11/08/20 1900 11/08/20 1914 11/08/20 1923 11/08/20 1927  BP: (!) 99/44     Pulse: 92  97   Resp: (!) 23  (!) 26   Temp:  98.5 F (36.9 C)    TempSrc:  Axillary    SpO2: 97%  98% 96%  Weight:      Height:        Intake/Output Summary (Last 24 hours) at 11/08/2020 1930 Last data filed at 11/08/2020 1800 Gross per 24 hour  Intake 1125 ml  Output 300 ml  Net 825 ml   Filed Weights   11/06/20 0431 11/07/20 0407 11/08/20 0500  Weight: 91.4 kg 89.8 kg 90.6 kg    Examination:  General exam: open eyes briefly to voice, does not follow command, nonverbal Respiratory system: Trach dependent , diminished at bases . Cardiovascular system: S1 & S2 heard, RRR. Marland Kitchen Gastrointestinal system: Positive PEG , abdomen is nondistended, soft and nontender.  Normal bowel sounds heard. Central nervous system: Open eyes to voice briefly. Extremities: No spontaneous movement Skin: No rashes, lesions or ulcers Psychiatry: Vegetative state    Data Reviewed: I have personally reviewed following labs and imaging studies  CBC: Recent Labs  Lab 11/03/20 0630 11/07/20 0646  WBC 10.1 9.7  NEUTROABS 5.1  --   HGB 10.8* 10.0*  HCT 34.6* 33.1*  MCV 88.3 87.8  PLT 379 759    Basic Metabolic Panel: Recent Labs  Lab 11/03/20 0630 11/07/20 0646  NA 138 139  K 4.5 4.4  CL 103 104  CO2 27 27  GLUCOSE 99 114*  BUN 22 24*  CREATININE 0.46 0.53  CALCIUM 10.9* 10.8*    GFR: Estimated Creatinine Clearance: 72.4 mL/min (by C-G formula based on SCr of 0.53 mg/dL).  Liver Function Tests: Recent Labs  Lab 11/03/20 0630  AST 16  ALT 14  ALKPHOS 83  BILITOT 0.3  PROT 7.0  ALBUMIN 2.7*    CBG: Recent Labs  Lab 11/08/20 0348 11/08/20 0748 11/08/20 1110 11/08/20 1510 11/08/20 1912   GLUCAP 119* 146* 108* 122* 128*     No results found for this or any previous visit (from the past 240 hour(s)).       Radiology Studies: No results found.      Scheduled Meds:  arformoterol  15 mcg Nebulization BID   baclofen  5 mg Per Tube TID   budesonide  0.5 mg Nebulization BID   chlorhexidine gluconate (MEDLINE KIT)  15 mL Mouth Rinse BID   Chlorhexidine Gluconate Cloth  6 each Topical Daily   clonazePAM  0.5 mg Per Tube BID   clopidogrel  75 mg Per Tube Daily   enoxaparin (LOVENOX) injection  0.5 mg/kg Subcutaneous Q24H   famotidine  20 mg Per Tube BID   feeding supplement (PROSource TF)  45 mL Per Tube BID   fiber  1 packet Per Tube BID   Gerhardt's butt cream   Topical BID   guaiFENesin  5 mL Per Tube Q6H   hydrocortisone cream   Topical TID   insulin aspart  0-15 Units Subcutaneous Q4H   insulin aspart  5 Units Subcutaneous Q4H   insulin glargine  40 Units Subcutaneous QHS   levETIRAcetam  2,500 mg Per Tube BID   loratadine  10 mg Per Tube Daily   mouth rinse  15 mL Mouth Rinse 10 times per day   metoprolol tartrate  12.5 mg Per Tube BID   montelukast  10 mg Per Tube Daily   nutrition supplement (JUVEN)  1 packet Per Tube BID   revefenacin  175 mcg Nebulization Daily   tiZANidine  2 mg Per Tube QHS   valproic acid  1,000 mg Per Tube Q12H   Continuous Infusions:  sodium  chloride     feeding supplement (JEVITY 1.5 CAL/FIBER) 1,000 mL (11/08/20 1207)     LOS: 38 days   Time spent: 35 mins Greater than 50% of this time was spent in counseling, explanation of diagnosis, planning of further management, and coordination of care.   Voice Recognition Viviann Spare dictation system was used to create this note, attempts have been made to correct errors. Please contact the author with questions and/or clarifications.   Florencia Reasons, MD PhD FACP [facility]  Available via Epic secure chat 7am-7pm for nonurgent issues Please page for urgent issues To page  the attending provider between 7A-7P or the covering provider during after hours 7P-7A, please log into the web site [email or web address] and access using universal [place] password for that web site. If you do not have the password, please call the hospital operator.    11/08/2020, 7:30 PM

## 2020-11-08 NOTE — Progress Notes (Signed)
Pt placed on 28% ATC per wean protocol. Pt is tolerating well at this time. RN made aware. RT to continue to monitor. 

## 2020-11-09 DIAGNOSIS — J9621 Acute and chronic respiratory failure with hypoxia: Secondary | ICD-10-CM | POA: Diagnosis not present

## 2020-11-09 DIAGNOSIS — J438 Other emphysema: Secondary | ICD-10-CM | POA: Diagnosis not present

## 2020-11-09 DIAGNOSIS — R569 Unspecified convulsions: Secondary | ICD-10-CM | POA: Diagnosis not present

## 2020-11-09 LAB — BASIC METABOLIC PANEL
Anion gap: 7 (ref 5–15)
BUN: 31 mg/dL — ABNORMAL HIGH (ref 8–23)
CO2: 28 mmol/L (ref 22–32)
Calcium: 10.6 mg/dL — ABNORMAL HIGH (ref 8.9–10.3)
Chloride: 106 mmol/L (ref 98–111)
Creatinine, Ser: 0.49 mg/dL (ref 0.44–1.00)
GFR, Estimated: >60 mL/min (ref >60)
Glucose, Bld: 109 mg/dL — ABNORMAL HIGH (ref 70–99)
Potassium: 3.9 mmol/L (ref 3.5–5.1)
Sodium: 141 mmol/L (ref 135–145)

## 2020-11-09 LAB — CBC WITH DIFFERENTIAL/PLATELET
Abs Immature Granulocytes: 0.04 10*3/uL (ref 0.00–0.07)
Basophils Absolute: 0 10*3/uL (ref 0.0–0.1)
Basophils Relative: 1 %
Eosinophils Absolute: 0.2 10*3/uL (ref 0.0–0.5)
Eosinophils Relative: 3 %
HCT: 31.3 % — ABNORMAL LOW (ref 36.0–46.0)
Hemoglobin: 9.5 g/dL — ABNORMAL LOW (ref 12.0–15.0)
Immature Granulocytes: 1 %
Lymphocytes Relative: 30 %
Lymphs Abs: 2.2 10*3/uL (ref 0.7–4.0)
MCH: 26.9 pg (ref 26.0–34.0)
MCHC: 30.4 g/dL (ref 30.0–36.0)
MCV: 88.7 fL (ref 80.0–100.0)
Monocytes Absolute: 0.8 10*3/uL (ref 0.1–1.0)
Monocytes Relative: 11 %
Neutro Abs: 4.1 10*3/uL (ref 1.7–7.7)
Neutrophils Relative %: 54 %
Platelets: 300 10*3/uL (ref 150–400)
RBC: 3.53 MIL/uL — ABNORMAL LOW (ref 3.87–5.11)
RDW: 17.2 % — ABNORMAL HIGH (ref 11.5–15.5)
WBC: 7.3 10*3/uL (ref 4.0–10.5)
nRBC: 0 % (ref 0.0–0.2)

## 2020-11-09 LAB — MAGNESIUM: Magnesium: 2 mg/dL (ref 1.7–2.4)

## 2020-11-09 LAB — GLUCOSE, CAPILLARY
Glucose-Capillary: 136 mg/dL — ABNORMAL HIGH (ref 70–99)
Glucose-Capillary: 122 mg/dL — ABNORMAL HIGH (ref 70–99)
Glucose-Capillary: 95 mg/dL (ref 70–99)
Glucose-Capillary: 94 mg/dL (ref 70–99)
Glucose-Capillary: 109 mg/dL — ABNORMAL HIGH (ref 70–99)
Glucose-Capillary: 145 mg/dL — ABNORMAL HIGH (ref 70–99)

## 2020-11-09 LAB — MRSA NEXT GEN BY PCR, NASAL: MRSA by PCR Next Gen: DETECTED — AB

## 2020-11-09 LAB — PHOSPHORUS: Phosphorus: 3 mg/dL (ref 2.5–4.6)

## 2020-11-09 LAB — CORTISOL: Cortisol, Plasma: 10.4 ug/dL

## 2020-11-09 MED ORDER — MUPIROCIN 2 % EX OINT
TOPICAL_OINTMENT | CUTANEOUS | Status: AC
  Administered 2020-11-09: 1
  Filled 2020-11-09: qty 22

## 2020-11-09 MED ORDER — MUPIROCIN 2 % EX OINT
1.0000 "application " | TOPICAL_OINTMENT | Freq: Two times a day (BID) | CUTANEOUS | Status: AC
  Administered 2020-11-09 – 2020-11-13 (×9): 1 via NASAL
  Filled 2020-11-09 (×2): qty 22

## 2020-11-09 MED ORDER — CHLORHEXIDINE GLUCONATE CLOTH 2 % EX PADS
6.0000 | MEDICATED_PAD | Freq: Every day | CUTANEOUS | Status: DC
  Administered 2020-11-10 – 2020-11-11 (×2): 6 via TOPICAL

## 2020-11-09 NOTE — Progress Notes (Signed)
RT placed pt back on full vent support due to increase WOB with accessory muscle use and HR in the 120's. Pt tolerated 7 hours of ATC. RN made aware.

## 2020-11-09 NOTE — TOC Progression Note (Addendum)
Transition of Care [facility]) - Progression Note    Patient Details  Name: Sonia [facility] MRN: [id number] Date of Birth: 1955/12/05  [facility] CM/SW Contact  Erin Sons, [place] Phone Number: 11/09/2020, 1:25 PM  Clinical Narrative:     CSW called and left message with  Marlane Hatcher at [facility] requesting return call.  CSW called [facility] admissions; left voicemail requesting return call.   Expected Discharge Plan: Skilled Nursing Facility Barriers to Discharge: No SNF bed  Expected Discharge Plan and Services Expected Discharge Plan: Skilled Nursing Facility   Discharge Planning Services: CM Consult                                           Social Determinants of Health (SDOH) Interventions    Readmission Risk Interventions No flowsheet data found.

## 2020-11-09 NOTE — Progress Notes (Signed)
Pt placed on 28% ATC per wean protocol. Pt is tolerating well at this time. RN made aware. RT to continue to monitor.

## 2020-11-09 NOTE — Progress Notes (Signed)
PROGRESS NOTE    Sonia Drake  MVE:[id number] DOB: [birth year] DOA: 09/24/2020 PCP: Garwin Brothers, MD    Chief Complaint  Patient presents with   Respiratory Distress    Brief Narrative:  65 y.o from SNF , Hx of DM2, CHF, h/o cardiac arrest in 11/2019 result in severe anoxic brain injury, s/p PEG tube, s/p chronic trach presents with acute on chronic respiratory failure.  Bronchoscopy performed on 6/16 showed dynamic airway collapse, tracheal collapsenow vent-dependent, not able to return to SNF  Subjective:  She opens eyes spontaneously, does not follow command She is on trach collar during the day, on vent at night She is tolerating tube feeds  No significant interval changes  Assessment & Plan:   Principal Problem:   Sepsis ([place]) Active Problems:   COPD (chronic obstructive pulmonary disease) (HCC)   Chronic diastolic CHF (congestive heart failure) (HCC)   Acute on chronic respiratory failure with hypoxia (HCC)   GERD (gastroesophageal reflux disease)   Seizures (HCC)   Type 2 diabetes mellitus without complication, with long-term current use of insulin (HCC)   Severe hypoxic-ischemic encephalopathy   Tracheostomy dependence ([place])   Pressure injury of skin   Acute on chronic hypoxic respiratory failure Likely due to dynamic airway collapse, also has advanced COPD ,now vent dependent at night There is a concern of aspiration pneumonia [name] /sepsis on presentation, ID consulted think infection is less likely with reassuring procalcitonin, positive blood culture likely contamination due to bedbound status , she was briefly on antibiotic for 4 days initially, antibiotic discontinued per ID recommendation  Anoxic brain injury, vegetative state ,bedbound status, nonverbal at baseline Status post trach and PEG She is seen by neurology does not appear to have meaningful recovery  Seizure disorders, with history of epilepsy prior to anoxic brain injury, worse since PEA  arrest She is evaluated by neurology, was briefly on propofol drip Had several EEG study She is currently on clonazepam, Keppra, valproic acid Valproic acid level 56 on 7/20  Insulin-dependent type 2 diabetes A1c 7.0 Blood glucose stable on current regimen  History of CVA Continue Plavix  Hypertension Discontinue metoprolol due to soft BP A.m. cortisol level unremarkable Consider midodrine if BP remains to be low Monitor  Chronic diastolic CHF With preserved left ventricular EF She does has edema on exam today, volume overload/third spacing , diuresis is limited by low blood pressure   Obesity: Body mass index is 36.33 kg/m.Marland Kitchen Seen by dietician.  I agree with the assessment and plan as outlined below: Nutrition Status: Nutrition Problem: Increased nutrient needs Etiology: wound healing Signs/Symptoms: estimated needs Interventions: Tube feeding  . Sacral wound, present on admission  moisture related seen by wound care  Clean the sacrum with soap and water, rinse, pat dry. Place a piece of Xeroform gauze on the open wounds and secure with foam dressing. Change daily  Failure to thrive, poor prognosis, seen by palliative care, remain full code, palliative care signed off  Unresulted Labs (From admission, onward)     Start     Ordered   11/15/20 2836  Basic metabolic panel  Every Wednesday,   R     Question:  Specimen collection method  Answer:  Lab=Lab collect   11/09/20 0815   11/15/20 0815  CBC  Every Wednesday,   R     Question:  Specimen collection method  Answer:  Lab=Lab collect   11/09/20 0815   11/05/20 1004  Levetiracetam level  Once,   R  Question:  Specimen collection method  Answer:  Lab=Lab collect   11/05/20 1003              DVT prophylaxis:   Lovenox   Code Status: Full Family Communication: None at bedside Disposition:   Status is: Inpatient   Dispo: The patient is from: Skilled nursing facility              Anticipated d/c is  to: To be determined              Anticipated d/c date is: Appear to be difficult placement                Consultants:  Pulmonary critical care Infectious disease Palliative care Neurology  Procedures:  Bronchoscopy on 6/16, 6/23 EEG 6/25, 6/27, 6/28  Antimicrobials:    Anti-infectives (From admission, onward)    Start     Dose/Rate Route Frequency Ordered Stop   10/07/20 1400  vancomycin (VANCOREADY) IVPB 1500 mg/300 mL  Status:  Discontinued        1,500 mg 150 mL/hr over 120 Minutes Intravenous Every 24 hours 10/07/20 1223 10/09/20 0829   10/07/20 1200  ceFEPIme (MAXIPIME) 2 g in sodium chloride 0.9 % 100 mL IVPB  Status:  Discontinued        2 g 200 mL/hr over 30 Minutes Intravenous Every 8 hours 10/07/20 0828 10/07/20 1221   10/02/20 1400  vancomycin (VANCOREADY) IVPB 1250 mg/250 mL  Status:  Discontinued        1,250 mg 166.7 mL/hr over 90 Minutes Intravenous Every 24 hours 10/10/2020 1313 10/04/20 1221   09/23/2020 2200  ceFEPIme (MAXIPIME) 2 g in sodium chloride 0.9 % 100 mL IVPB  Status:  Discontinued        2 g 200 mL/hr over 30 Minutes Intravenous Every 8 hours 10/16/2020 1313 10/04/20 1221   10/05/2020 2200  metroNIDAZOLE (FLAGYL) IVPB 500 mg  Status:  Discontinued        500 mg 100 mL/hr over 60 Minutes Intravenous Every 8 hours 09/26/2020 1616 10/04/20 1221   10/17/2020 1330  vancomycin (VANCOREADY) IVPB 2000 mg/400 mL        2,000 mg 200 mL/hr over 120 Minutes Intravenous  Once 10/13/2020 1257 09/26/2020 1550   09/25/2020 1300  ceFEPIme (MAXIPIME) 2 g in sodium chloride 0.9 % 100 mL IVPB        2 g 200 mL/hr over 30 Minutes Intravenous  Once 09/21/2020 1257 10/16/2020 1330   10/06/2020 1300  metroNIDAZOLE (FLAGYL) IVPB 500 mg        500 mg 100 mL/hr over 60 Minutes Intravenous  Once 10/19/2020 1257 09/20/2020 1443          Objective: Vitals:   11/09/20 0752 11/09/20 0753 11/09/20 0756 11/09/20 0814  BP:      Pulse:   90 91  Resp:   18 (!) 27  Temp:      TempSrc:       SpO2: 99% 99% 99% 97%  Weight:      Height:        Intake/Output Summary (Last 24 hours) at 11/09/2020 0816 Last data filed at 11/09/2020 0600 Gross per 24 hour  Intake 1110 ml  Output 400 ml  Net 710 ml   Filed Weights   11/07/20 0407 11/08/20 0500 11/09/20 0413  Weight: 89.8 kg 90.6 kg 90.1 kg    Examination:  General exam: open eyes briefly to voice, does not follow command, nonverbal Respiratory system: Lurline Idol  dependent , diminished at bases . Cardiovascular system: S1 & S2 heard, RRR. Marland Kitchen Gastrointestinal system: Positive PEG , abdomen is nondistended, soft and nontender.  Normal bowel sounds heard. Central nervous system: Open eyes to voice briefly. Extremities: No spontaneous movement Skin: No rashes, lesions or ulcers Psychiatry: Vegetative state    Data Reviewed: I have personally reviewed following labs and imaging studies  CBC: Recent Labs  Lab 11/03/20 0630 11/07/20 0646 11/09/20 0211  WBC 10.1 9.7 7.3  NEUTROABS 5.1  --  4.1  HGB 10.8* 10.0* 9.5*  HCT 34.6* 33.1* 31.3*  MCV 88.3 87.8 88.7  PLT 379 360 536    Basic Metabolic Panel: Recent Labs  Lab 11/03/20 0630 11/07/20 0646 11/09/20 0211  NA 138 139 141  K 4.5 4.4 3.9  CL 103 104 106  CO2 _0 GLUCOSE 99 114* 109*  BUN 22 24* 31*  CREATININE 0.46 0.53 0.49  CALCIUM 10.9* 10.8* 10.6*  MG  --   --  2.0  PHOS  --   --  3.0    GFR: Estimated Creatinine Clearance: 72.2 mL/min (by C-G formula based on SCr of 0.49 mg/dL).  Liver Function Tests: Recent Labs  Lab 11/03/20 0630  AST 16  ALT 14  ALKPHOS 83  BILITOT 0.3  PROT 7.0  ALBUMIN 2.7*    CBG: Recent Labs  Lab 11/08/20 1510 11/08/20 1912 11/08/20 2331 11/09/20 0306 11/09/20 0725  GLUCAP 122* 128* 123* 136* 122*     No results found for this or any previous visit (from the past 240 hour(s)).       Radiology Studies: No results found.      Scheduled Meds:  arformoterol  15 mcg Nebulization BID    baclofen  5 mg Per Tube TID   budesonide  0.5 mg Nebulization BID   chlorhexidine gluconate (MEDLINE KIT)  15 mL Mouth Rinse BID   Chlorhexidine Gluconate Cloth  6 each Topical Daily   clonazePAM  0.5 mg Per Tube BID   clopidogrel  75 mg Per Tube Daily   enoxaparin (LOVENOX) injection  0.5 mg/kg Subcutaneous Q24H   famotidine  20 mg Per Tube BID   feeding supplement (PROSource TF)  45 mL Per Tube BID   fiber  1 packet Per Tube BID   Gerhardt's butt cream   Topical BID   guaiFENesin  5 mL Per Tube Q6H   hydrocortisone cream   Topical TID   insulin aspart  0-15 Units Subcutaneous Q4H   insulin aspart  5 Units Subcutaneous Q4H   insulin glargine  40 Units Subcutaneous QHS   levETIRAcetam  2,500 mg Per Tube BID   loratadine  10 mg Per Tube Daily   mouth rinse  15 mL Mouth Rinse 10 times per day   metoprolol tartrate  12.5 mg Per Tube BID   montelukast  10 mg Per Tube Daily   nutrition supplement (JUVEN)  1 packet Per Tube BID   revefenacin  175 mcg Nebulization Daily   tiZANidine  2 mg Per Tube QHS   valproic acid  1,000 mg Per Tube Q12H   Continuous Infusions:  sodium chloride     feeding supplement (JEVITY 1.5 CAL/FIBER) 1,000 mL (11/08/20 1207)     LOS: 39 days   Time spent: 25 mins Greater than 50% of this time was spent in counseling, explanation of diagnosis, planning of further management, and coordination of care.   Voice Recognition Viviann Spare dictation system was used to  create this note, attempts have been made to correct errors. Please contact the author with questions and/or clarifications.   Florencia Reasons, MD PhD FACP [facility]  Available via Epic secure chat 7am-7pm for nonurgent issues Please page for urgent issues To page the attending provider between 7A-7P or the covering provider during after hours 7P-7A, please log into the web site [email or web address] and access using universal [place] password for that web site. If you do not have the password, please  call the hospital operator.    11/09/2020, 8:16 AM

## 2020-11-10 DIAGNOSIS — J9621 Acute and chronic respiratory failure with hypoxia: Secondary | ICD-10-CM | POA: Diagnosis not present

## 2020-11-10 LAB — GLUCOSE, CAPILLARY
Glucose-Capillary: 109 mg/dL — ABNORMAL HIGH (ref 70–99)
Glucose-Capillary: 97 mg/dL (ref 70–99)
Glucose-Capillary: 115 mg/dL — ABNORMAL HIGH (ref 70–99)
Glucose-Capillary: 90 mg/dL (ref 70–99)
Glucose-Capillary: 141 mg/dL — ABNORMAL HIGH (ref 70–99)
Glucose-Capillary: 115 mg/dL — ABNORMAL HIGH (ref 70–99)

## 2020-11-10 LAB — LEVETIRACETAM LEVEL: Levetiracetam Lvl: 42.3 ug/mL — ABNORMAL HIGH (ref 10.0–40.0)

## 2020-11-10 NOTE — Progress Notes (Signed)
RT attempted pt on PSV 5/5 before ATC trial. VT's were in the low 100's and RR increased into the 50's with accessory muscle use. PS was increased to 12 with little change in RR and accessory muscle use. Pt was placed back on full vent support. RN made aware. RT will attempt weaning later today.

## 2020-11-10 NOTE — Progress Notes (Signed)
Pt placed back on full vent support due to increased WOB with accessory muscle use and nasal flaring. After placing back on full support WOB is WNL. RN made aware.

## 2020-11-10 NOTE — TOC Progression Note (Signed)
Transition of Care [facility]) - Progression Note    Patient Details  Name: Sonia Drake MRN: [id number] Date of Birth: 1955/10/10  [facility] CM/SW Contact  Erin Sons, [place] Phone Number: 11/10/2020, 4:36 PM  Clinical Narrative:     CSW returned voicemail from [facility] Coordinator at [phone]. CSW discussed Barriers to DC with Amy. Regarding [facility] being out of network, Amy states she would be available to coordinate with [facility] if they were agreeable to do a 1-time contract with [facility]. TOC will continue to follow.   Expected Discharge Plan: Skilled Nursing Facility Barriers to Discharge: No SNF bed  Expected Discharge Plan and Services Expected Discharge Plan: Skilled Nursing Facility   Discharge Planning Services: CM Consult                                           Social Determinants of Health (SDOH) Interventions    Readmission Risk Interventions No flowsheet data found.

## 2020-11-10 NOTE — Progress Notes (Signed)
PROGRESS NOTE    Sonia Drake  [id number]:[id number] DOB: 27-Jun-1955 DOA: 10/15/2020 PCP: Garwin Brothers, MD    Chief Complaint  Patient presents with   Respiratory Distress    Brief Narrative:  65 y.o from SNF , Hx of DM2, CHF, h/o cardiac arrest in 11/2019 result in severe anoxic brain injury, s/p PEG tube, s/p chronic trach presents with acute on chronic respiratory failure.  Bronchoscopy performed on 6/16 showed dynamic airway collapse, tracheal collapsenow vent-dependent, not able to return to SNF  Subjective:  She opens eyes spontaneously, does not follow command She was  on trach collar  earlier today, she is back on vent this pm due to increased work of breathing while on trach collar,  She is now appear NAD, open eyes spontaneously, does not response to voice or light stimuli Vital signs are stable She is tolerating tube feeds    Assessment & Plan:   Principal Problem:   Sepsis ([place]) Active Problems:   COPD (chronic obstructive pulmonary disease) ([place])   Chronic diastolic CHF (congestive heart failure) (HCC)   Acute on chronic respiratory failure with hypoxia (HCC)   GERD (gastroesophageal reflux disease)   Seizures (HCC)   Type 2 diabetes mellitus without complication, with long-term current use of insulin (HCC)   Severe hypoxic-ischemic encephalopathy   Tracheostomy dependence ([place])   Pressure injury of skin   Acute on chronic hypoxic respiratory failure Likely due to dynamic airway collapse, also has advanced COPD ,now vent dependent at night There is a concern of aspiration pneumonia [name] /sepsis on presentation, ID consulted think infection is less likely with reassuring procalcitonin, positive blood culture likely contamination due to bedbound status , she was briefly on antibiotic for 4 days initially, antibiotic discontinued per ID recommendation  Anoxic brain injury, vegetative state ,bedbound status, nonverbal at baseline Status post trach and PEG She is  seen by neurology does not appear to have meaningful recovery  Seizure disorders, with history of epilepsy prior to anoxic brain injury, worse since PEA arrest She is evaluated by neurology, was briefly on propofol drip Had several EEG study She is currently on clonazepam, Keppra, valproic acid Valproic acid level 56 on 7/20  Insulin-dependent type 2 diabetes A1c 7.0 Blood glucose stable on current regimen  History of CVA Continue Plavix  Hypertension Discontinue metoprolol due to soft BP A.m. cortisol level unremarkable Consider midodrine if BP remains to be low Monitor  Chronic diastolic CHF With preserved left ventricular EF She does has edema on exam today, volume overload/third spacing , diuresis is limited by low blood pressure   Obesity: Body mass index is 36.33 kg/m.Marland Kitchen Seen by dietician.  I agree with the assessment and plan as outlined below: Nutrition Status: Nutrition Problem: Increased nutrient needs Etiology: wound healing Signs/Symptoms: estimated needs Interventions: Tube feeding  . Sacral wound, present on admission  moisture related seen by wound care  Clean the sacrum with soap and water, rinse, pat dry. Place a piece of Xeroform gauze on the open wounds and secure with foam dressing. Change daily  Failure to thrive, poor prognosis, seen by palliative care, remain full code, palliative care signed off  Unresulted Labs (From admission, onward)     Start     Ordered   11/15/20 1962  Basic metabolic panel  Every Wednesday,   R     Question:  Specimen collection method  Answer:  Lab=Lab collect   11/09/20 0815   11/15/20 0815  CBC  Every Wednesday,  R     Question:  Specimen collection method  Answer:  Lab=Lab collect   11/09/20 0815              DVT prophylaxis:   Lovenox   Code Status: Full Family Communication: None at bedside Disposition:   Status is: Inpatient   Dispo: The patient is from: Skilled nursing facility               Anticipated d/c is to: To be determined              Anticipated d/c date is: Appear to be difficult placement                Consultants:  Pulmonary critical care Infectious disease Palliative care Neurology  Procedures:  Bronchoscopy on 6/16, 6/23 EEG 6/25, 6/27, 6/28  Antimicrobials:    Anti-infectives (From admission, onward)    Start     Dose/Rate Route Frequency Ordered Stop   10/07/20 1400  vancomycin (VANCOREADY) IVPB 1500 mg/300 mL  Status:  Discontinued        1,500 mg 150 mL/hr over 120 Minutes Intravenous Every 24 hours 10/07/20 1223 10/09/20 0829   10/07/20 1200  ceFEPIme (MAXIPIME) 2 g in sodium chloride 0.9 % 100 mL IVPB  Status:  Discontinued        2 g 200 mL/hr over 30 Minutes Intravenous Every 8 hours 10/07/20 0828 10/07/20 1221   10/02/20 1400  vancomycin (VANCOREADY) IVPB 1250 mg/250 mL  Status:  Discontinued        1,250 mg 166.7 mL/hr over 90 Minutes Intravenous Every 24 hours 10/02/2020 1313 10/04/20 1221   10/09/2020 2200  ceFEPIme (MAXIPIME) 2 g in sodium chloride 0.9 % 100 mL IVPB  Status:  Discontinued        2 g 200 mL/hr over 30 Minutes Intravenous Every 8 hours 09/28/2020 1313 10/04/20 1221   09/23/2020 2200  metroNIDAZOLE (FLAGYL) IVPB 500 mg  Status:  Discontinued        500 mg 100 mL/hr over 60 Minutes Intravenous Every 8 hours 10/18/2020 1616 10/04/20 1221   09/20/2020 1330  vancomycin (VANCOREADY) IVPB 2000 mg/400 mL        2,000 mg 200 mL/hr over 120 Minutes Intravenous  Once 10/11/2020 1257 10/04/2020 1550   10/18/2020 1300  ceFEPIme (MAXIPIME) 2 g in sodium chloride 0.9 % 100 mL IVPB        2 g 200 mL/hr over 30 Minutes Intravenous  Once 10/04/2020 1257 10/07/2020 1330   09/20/2020 1300  metroNIDAZOLE (FLAGYL) IVPB 500 mg        500 mg 100 mL/hr over 60 Minutes Intravenous  Once 10/11/2020 1257 10/07/2020 1443          Objective: Vitals:   11/10/20 1500 11/10/20 1518 11/10/20 1541 11/10/20 1609  BP: (!) 145/60   102/86  Pulse: (!) 114  (!) 111 (!) 105   Resp: (!) 28  (!) 24 (!) 22  Temp:  98.3 F (36.8 C)    TempSrc:  Axillary    SpO2: 98%  99% 97%  Weight:      Height:        Intake/Output Summary (Last 24 hours) at 11/10/2020 1741 Last data filed at 11/10/2020 1600 Gross per 24 hour  Intake 1320 ml  Output 1320 ml  Net 0 ml   Filed Weights   11/07/20 0407 11/08/20 0500 11/09/20 0413  Weight: 89.8 kg 90.6 kg 90.1 kg    Examination:  General  exam: open eyes briefly to voice, does not follow command, nonverbal Respiratory system: Trach dependent , diminished at bases . Cardiovascular system: S1 & S2 heard, RRR. Marland Kitchen Gastrointestinal system: Positive PEG , abdomen is nondistended, soft and nontender.  Normal bowel sounds heard. Central nervous system: Open eyes to voice briefly. Extremities: No spontaneous movement Skin: No rashes, lesions or ulcers Psychiatry: Vegetative state    Data Reviewed: I have personally reviewed following labs and imaging studies  CBC: Recent Labs  Lab 11/07/20 0646 11/09/20 0211  WBC 9.7 7.3  NEUTROABS  --  4.1  HGB 10.0* 9.5*  HCT 33.1* 31.3*  MCV 87.8 88.7  PLT 360 409    Basic Metabolic Panel: Recent Labs  Lab 11/07/20 0646 11/09/20 0211  NA 139 141  K 4.4 3.9  CL 104 106  CO2 27 28  GLUCOSE 114* 109*  BUN 24* 31*  CREATININE 0.53 0.49  CALCIUM 10.8* 10.6*  MG  --  2.0  PHOS  --  3.0    GFR: Estimated Creatinine Clearance: 72.2 mL/min (by C-G formula based on SCr of 0.49 mg/dL).  Liver Function Tests: No results for input(s): AST, ALT, ALKPHOS, BILITOT, PROT, ALBUMIN in the last 168 hours.   CBG: Recent Labs  Lab 11/09/20 2313 11/10/20 0323 11/10/20 0726 11/10/20 1102 11/10/20 1507  GLUCAP 145* 109* 97 115* 90     Recent Results (from the past 240 hour(s))  MRSA Next Gen by PCR, Nasal     Status: Abnormal   Collection Time: 11/09/20 10:12 AM   Specimen: Nasal Mucosa; Nasal Swab  Result Value Ref Range Status   MRSA by PCR Next Gen DETECTED (A) NOT  DETECTED Final    Comment: RESULT CALLED TO, READ BACK BY AND VERIFIED WITH: L BASS RN [id number] 11/09/20 A BROWNING (NOTE) The GeneXpert MRSA Assay (FDA approved for NASAL specimens only), is one component of a comprehensive MRSA colonization surveillance program. It is not intended to diagnose MRSA infection nor to guide or monitor treatment for MRSA infections. Test performance is not FDA approved in patients less than 61 years old. Performed at [facility], [place] [address], [place], [place] [address]          Radiology Studies: No results found.      Scheduled Meds:  arformoterol  15 mcg Nebulization BID   baclofen  5 mg Per Tube TID   budesonide  0.5 mg Nebulization BID   chlorhexidine gluconate (MEDLINE KIT)  15 mL Mouth Rinse BID   Chlorhexidine Gluconate Cloth  6 each Topical Daily   Chlorhexidine Gluconate Cloth  6 each Topical Q0600   clonazePAM  0.5 mg Per Tube BID   clopidogrel  75 mg Per Tube Daily   enoxaparin (LOVENOX) injection  0.5 mg/kg Subcutaneous Q24H   famotidine  20 mg Per Tube BID   feeding supplement (PROSource TF)  45 mL Per Tube BID   fiber  1 packet Per Tube BID   Gerhardt's butt cream   Topical BID   guaiFENesin  5 mL Per Tube Q6H   hydrocortisone cream   Topical TID   insulin aspart  0-15 Units Subcutaneous Q4H   insulin aspart  5 Units Subcutaneous Q4H   insulin glargine  40 Units Subcutaneous QHS   levETIRAcetam  2,500 mg Per Tube BID   loratadine  10 mg Per Tube Daily   mouth rinse  15 mL Mouth Rinse 10 times per day   montelukast  10 mg  Per Tube Daily   mupirocin ointment  1 application Nasal BID   nutrition supplement (JUVEN)  1 packet Per Tube BID   revefenacin  175 mcg Nebulization Daily   tiZANidine  2 mg Per Tube QHS   valproic acid  1,000 mg Per Tube Q12H   Continuous Infusions:  sodium chloride     feeding supplement (JEVITY 1.5 CAL/FIBER) 1,000 mL (11/10/20 1156)     LOS: 40 days   Time spent: 15 mins Greater than  50% of this time was spent in counseling, explanation of diagnosis, planning of further management, and coordination of care.   Voice Recognition Viviann Spare dictation system was used to create this note, attempts have been made to correct errors. Please contact the author with questions and/or clarifications.   Florencia Reasons, MD PhD FACP [facility]  Available via Epic secure chat 7am-7pm for nonurgent issues Please page for urgent issues To page the attending provider between 7A-7P or the covering provider during after hours 7P-7A, please log into the web site [email or web address] and access using universal  password for that web site. If you do not have the password, please call the hospital operator.    11/10/2020, 5:41 PM

## 2020-11-10 NOTE — Progress Notes (Signed)
Pt placed on 28% ATC and is tolerating well at this time. RN made aware. RT to continue to monitor.

## 2020-11-11 DIAGNOSIS — R569 Unspecified convulsions: Secondary | ICD-10-CM | POA: Diagnosis not present

## 2020-11-11 DIAGNOSIS — J9621 Acute and chronic respiratory failure with hypoxia: Secondary | ICD-10-CM | POA: Diagnosis not present

## 2020-11-11 DIAGNOSIS — J438 Other emphysema: Secondary | ICD-10-CM | POA: Diagnosis not present

## 2020-11-11 LAB — GLUCOSE, CAPILLARY
Glucose-Capillary: 84 mg/dL (ref 70–99)
Glucose-Capillary: 96 mg/dL (ref 70–99)
Glucose-Capillary: 103 mg/dL — ABNORMAL HIGH (ref 70–99)
Glucose-Capillary: 119 mg/dL — ABNORMAL HIGH (ref 70–99)
Glucose-Capillary: 111 mg/dL — ABNORMAL HIGH (ref 70–99)
Glucose-Capillary: 134 mg/dL — ABNORMAL HIGH (ref 70–99)

## 2020-11-11 MED ORDER — INSULIN ASPART 100 UNIT/ML IJ SOLN
2.0000 [IU] | INTRAMUSCULAR | Status: DC
  Administered 2020-11-11 – 2020-11-16 (×28): 2 [IU] via SUBCUTANEOUS

## 2020-11-11 MED ORDER — HEPARIN SODIUM (PORCINE) 1000 UNIT/ML IJ SOLN
INTRAMUSCULAR | Status: AC
  Filled 2020-11-11: qty 4

## 2020-11-11 MED ORDER — CHLORHEXIDINE GLUCONATE CLOTH 2 % EX PADS
6.0000 | MEDICATED_PAD | Freq: Every day | CUTANEOUS | Status: AC
  Administered 2020-11-12 – 2020-11-14 (×3): 6 via TOPICAL

## 2020-11-11 NOTE — Progress Notes (Signed)
PROGRESS NOTE    RHETA HEMMELGARN  YWV:[id number] DOB: Feb 27, 1956 DOA: 09/29/2020 PCP: Garwin Brothers, MD    Chief Complaint  Patient presents with   Respiratory Distress    Brief Narrative:  65 y.o from SNF , Hx of DM2, CHF, h/o cardiac arrest in 11/2019 result in severe anoxic brain injury, s/p PEG tube, s/p chronic trach presents with acute on chronic respiratory failure.  Bronchoscopy performed on 6/16 showed dynamic airway collapse, tracheal collapsenow vent-dependent, not able to return to SNF  Subjective:  She opens eyes spontaneously, does not follow command She was  on trach collar  currently, vent is standby, currently,  She is now appear NAD, open eyes spontaneously, does not response to voice or light stimuli Vital signs are stable She is tolerating tube feeds No significant interval changes     Assessment & Plan:   Principal Problem:   Sepsis ([place]) Active Problems:   COPD (chronic obstructive pulmonary disease) ([place])   Chronic diastolic CHF (congestive heart failure) (HCC)   Acute on chronic respiratory failure with hypoxia (HCC)   GERD (gastroesophageal reflux disease)   Seizures (HCC)   Type 2 diabetes mellitus without complication, with long-term current use of insulin (HCC)   Severe hypoxic-ischemic encephalopathy   Tracheostomy dependence (HCC)   Pressure injury of skin   Acute on chronic hypoxic respiratory failure Likely due to dynamic airway collapse, also has advanced COPD ,now vent dependent at night There is a concern of aspiration pneumonia [name] /sepsis on presentation, ID consulted think infection is less likely with reassuring procalcitonin, positive blood culture likely contamination due to bedbound status , she was briefly on antibiotic for 4 days initially, antibiotic discontinued per ID recommendation  Anoxic brain injury, vegetative state ,bedbound status, nonverbal at baseline Status post trach and PEG She is seen by neurology does not  appear to have meaningful recovery  Seizure disorders, with history of epilepsy prior to anoxic brain injury, worse since PEA arrest She is evaluated by neurology, was briefly on propofol drip Had several EEG study She is currently on clonazepam, Keppra, valproic acid Valproic acid level 56 on 7/20  Insulin-dependent type 2 diabetes A1c 7.0 Has low bg on 7/22-7/23, decrease scheduled novolog q4hrs, continue adjust insulin    History of CVA Continue Plavix  Hypertension Discontinue metoprolol due to soft BP A.m. cortisol level unremarkable Consider midodrine if BP remains to be low Monitor  Chronic diastolic CHF With preserved left ventricular EF She does has edema on exam today, volume overload/third spacing , diuresis is limited by low blood pressure   Obesity: Body mass index is 36.33 kg/m.Marland Kitchen Seen by dietician.  I agree with the assessment and plan as outlined below: Nutrition Status: Nutrition Problem: Increased nutrient needs Etiology: wound healing Signs/Symptoms: estimated needs Interventions: Tube feeding  . Sacral wound, present on admission  moisture related seen by wound care  Clean the sacrum with soap and water, rinse, pat dry. Place a piece of Xeroform gauze on the open wounds and secure with foam dressing. Change daily  Failure to thrive, poor prognosis, seen by palliative care, remain full code, palliative care signed off  Unresulted Labs (From admission, onward)     Start     Ordered   11/15/20 8546  Basic metabolic panel  Every Wednesday,   R     Question:  Specimen collection method  Answer:  Lab=Lab collect   11/09/20 0815   11/15/20 0815  CBC  Every Wednesday,   R  Question:  Specimen collection method  Answer:  Lab=Lab collect   11/09/20 0815              DVT prophylaxis:   Lovenox   Code Status: Full Family Communication: None at bedside Disposition:   Status is: Inpatient   Dispo: The patient is from: Skilled nursing  facility              Anticipated d/c is to: To be determined              Anticipated d/c date is: Appear to be difficult placement                Consultants:  Pulmonary critical care Infectious disease Palliative care Neurology  Procedures:  Bronchoscopy on 6/16, 6/23 EEG 6/25, 6/27, 6/28  Antimicrobials:    Anti-infectives (From admission, onward)    Start     Dose/Rate Route Frequency Ordered Stop   10/07/20 1400  vancomycin (VANCOREADY) IVPB 1500 mg/300 mL  Status:  Discontinued        1,500 mg 150 mL/hr over 120 Minutes Intravenous Every 24 hours 10/07/20 1223 10/09/20 0829   10/07/20 1200  ceFEPIme (MAXIPIME) 2 g in sodium chloride 0.9 % 100 mL IVPB  Status:  Discontinued        2 g 200 mL/hr over 30 Minutes Intravenous Every 8 hours 10/07/20 0828 10/07/20 1221   10/02/20 1400  vancomycin (VANCOREADY) IVPB 1250 mg/250 mL  Status:  Discontinued        1,250 mg 166.7 mL/hr over 90 Minutes Intravenous Every 24 hours 09/23/2020 1313 10/04/20 1221   10/08/2020 2200  ceFEPIme (MAXIPIME) 2 g in sodium chloride 0.9 % 100 mL IVPB  Status:  Discontinued        2 g 200 mL/hr over 30 Minutes Intravenous Every 8 hours 10/06/2020 1313 10/04/20 1221   10/15/2020 2200  metroNIDAZOLE (FLAGYL) IVPB 500 mg  Status:  Discontinued        500 mg 100 mL/hr over 60 Minutes Intravenous Every 8 hours 10/02/2020 1616 10/04/20 1221   10/16/2020 1330  vancomycin (VANCOREADY) IVPB 2000 mg/400 mL        2,000 mg 200 mL/hr over 120 Minutes Intravenous  Once 10/03/2020 1257 09/25/2020 1550   09/29/2020 1300  ceFEPIme (MAXIPIME) 2 g in sodium chloride 0.9 % 100 mL IVPB        2 g 200 mL/hr over 30 Minutes Intravenous  Once 09/20/2020 1257 09/27/2020 1330   10/12/2020 1300  metroNIDAZOLE (FLAGYL) IVPB 500 mg        500 mg 100 mL/hr over 60 Minutes Intravenous  Once 10/15/2020 1257 10/17/2020 1443          Objective: Vitals:   11/11/20 0845 11/11/20 0900 11/11/20 1100 11/11/20 1111  BP:  103/81 (!) 92/58   Pulse:  87  76 87  Resp:  20 17 (!) 29  Temp:    98.4 F (36.9 C)  TempSrc:    Oral  SpO2: 98% 99% 100%   Weight:      Height:        Intake/Output Summary (Last 24 hours) at 11/11/2020 1140 Last data filed at 11/11/2020 1100 Gross per 24 hour  Intake 1375 ml  Output 1100 ml  Net 275 ml   Filed Weights   11/07/20 0407 11/08/20 0500 11/09/20 0413  Weight: 89.8 kg 90.6 kg 90.1 kg    Examination:  General exam: open eyes briefly to voice, does not follow command,  nonverbal Respiratory system: Trach dependent , diminished at bases . Cardiovascular system: S1 & S2 heard, RRR. Marland Kitchen Gastrointestinal system: Positive PEG , abdomen is nondistended, soft and nontender.  Normal bowel sounds heard. Central nervous system: Open eyes to voice briefly. Extremities: No spontaneous movement Skin: No rashes, lesions or ulcers Psychiatry: Vegetative state    Data Reviewed: I have personally reviewed following labs and imaging studies  CBC: Recent Labs  Lab 11/07/20 0646 11/09/20 0211  WBC 9.7 7.3  NEUTROABS  --  4.1  HGB 10.0* 9.5*  HCT 33.1* 31.3*  MCV 87.8 88.7  PLT 360 659    Basic Metabolic Panel: Recent Labs  Lab 11/07/20 0646 11/09/20 0211  NA 139 141  K 4.4 3.9  CL 104 106  CO2 27 28  GLUCOSE 114* 109*  BUN 24* 31*  CREATININE 0.53 0.49  CALCIUM 10.8* 10.6*  MG  --  2.0  PHOS  --  3.0    GFR: Estimated Creatinine Clearance: 72.2 mL/min (by C-G formula based on SCr of 0.49 mg/dL).  Liver Function Tests: No results for input(s): AST, ALT, ALKPHOS, BILITOT, PROT, ALBUMIN in the last 168 hours.   CBG: Recent Labs  Lab 11/10/20 1936 11/10/20 2318 11/11/20 0402 11/11/20 0756 11/11/20 1123  GLUCAP 141* 115* 84 96 103*     Recent Results (from the past 240 hour(s))  MRSA Next Gen by PCR, Nasal     Status: Abnormal   Collection Time: 11/09/20 10:12 AM   Specimen: Nasal Mucosa; Nasal Swab  Result Value Ref Range Status   MRSA by PCR Next Gen DETECTED (A) NOT  DETECTED Final    Comment: RESULT CALLED TO, READ BACK BY AND VERIFIED WITH: L BASS RN [id number] 11/09/20 A BROWNING (NOTE) The GeneXpert MRSA Assay (FDA approved for NASAL specimens only), is one component of a comprehensive MRSA colonization surveillance program. It is not intended to diagnose MRSA infection nor to guide or monitor treatment for MRSA infections. Test performance is not FDA approved in patients less than 34 years old. Performed at [facility], [place] [address] [place], [place] [address]          Radiology Studies: No results found.      Scheduled Meds:  heparin sodium (porcine)       arformoterol  15 mcg Nebulization BID   baclofen  5 mg Per Tube TID   budesonide  0.5 mg Nebulization BID   chlorhexidine gluconate (MEDLINE KIT)  15 mL Mouth Rinse BID   Chlorhexidine Gluconate Cloth  6 each Topical Daily   Chlorhexidine Gluconate Cloth  6 each Topical Q0600   clonazePAM  0.5 mg Per Tube BID   clopidogrel  75 mg Per Tube Daily   enoxaparin (LOVENOX) injection  0.5 mg/kg Subcutaneous Q24H   famotidine  20 mg Per Tube BID   feeding supplement (PROSource TF)  45 mL Per Tube BID   fiber  1 packet Per Tube BID   Gerhardt's butt cream   Topical BID   guaiFENesin  5 mL Per Tube Q6H   hydrocortisone cream   Topical TID   insulin aspart  0-15 Units Subcutaneous Q4H   insulin aspart  2 Units Subcutaneous Q4H   insulin glargine  40 Units Subcutaneous QHS   levETIRAcetam  2,500 mg Per Tube BID   loratadine  10 mg Per Tube Daily   mouth rinse  15 mL Mouth Rinse 10 times per day   montelukast  10 mg Per  Tube Daily   mupirocin ointment  1 application Nasal BID   nutrition supplement (JUVEN)  1 packet Per Tube BID   revefenacin  175 mcg Nebulization Daily   tiZANidine  2 mg Per Tube QHS   valproic acid  1,000 mg Per Tube Q12H   Continuous Infusions:  sodium chloride     feeding supplement (JEVITY 1.5 CAL/FIBER) 1,000 mL (11/10/20 1156)     LOS: 41 days    Time spent: 25 mins Greater than 50% of this time was spent in counseling, explanation of diagnosis, planning of further management, and coordination of care.   Voice Recognition Viviann Spare dictation system was used to create this note, attempts have been made to correct errors. Please contact the author with questions and/or clarifications.   Florencia Reasons, MD PhD FACP [facility]  Available via Epic secure chat 7am-7pm for nonurgent issues Please page for urgent issues To page the attending provider between 7A-7P or the covering provider during after hours 7P-7A, please log into the web site [email or web address] and access using universal  password for that web site. If you do not have the password, please call the hospital operator.    11/11/2020, 11:40 AM

## 2020-11-12 ENCOUNTER — Inpatient Hospital Stay (HOSPITAL_COMMUNITY): Payer: Medicare Other

## 2020-11-12 DIAGNOSIS — J438 Other emphysema: Secondary | ICD-10-CM | POA: Diagnosis not present

## 2020-11-12 DIAGNOSIS — J9621 Acute and chronic respiratory failure with hypoxia: Secondary | ICD-10-CM | POA: Diagnosis not present

## 2020-11-12 DIAGNOSIS — R569 Unspecified convulsions: Secondary | ICD-10-CM | POA: Diagnosis not present

## 2020-11-12 LAB — GLUCOSE, CAPILLARY
Glucose-Capillary: 103 mg/dL — ABNORMAL HIGH (ref 70–99)
Glucose-Capillary: 106 mg/dL — ABNORMAL HIGH (ref 70–99)
Glucose-Capillary: 107 mg/dL — ABNORMAL HIGH (ref 70–99)
Glucose-Capillary: 120 mg/dL — ABNORMAL HIGH (ref 70–99)
Glucose-Capillary: 126 mg/dL — ABNORMAL HIGH (ref 70–99)
Glucose-Capillary: 79 mg/dL (ref 70–99)

## 2020-11-12 MED ORDER — FUROSEMIDE 10 MG/ML IJ SOLN
20.0000 mg | Freq: Once | INTRAMUSCULAR | Status: AC
  Administered 2020-11-12: 20 mg via INTRAVENOUS
  Filled 2020-11-12: qty 2

## 2020-11-12 NOTE — Progress Notes (Signed)
PROGRESS NOTE    Sonia Drake  ZYY:[id number] DOB: 1955/11/17 DOA: 09/23/2020 PCP: Garwin Brothers, MD    Chief Complaint  Patient presents with   Respiratory Distress    Brief Narrative:  65 y.o from SNF , Hx of DM2, CHF, h/o cardiac arrest in 11/2019 result in severe anoxic brain injury, s/p PEG tube, s/p chronic trach presents with acute on chronic respiratory failure.  Bronchoscopy performed on 6/16 showed dynamic airway collapse, tracheal collapsenow vent-dependent, not able to return to SNF  Subjective:  She was tachypneic on tach collar this morning, Rt suctioned her airway, no significant mucus plugging, she was put back on vent Currently her vital signs are stable on the vent  She is sleeping She does appear to have edema She is tolerating tube feeds    Assessment & Plan:   Principal Problem:   Sepsis ([place]) Active Problems:   COPD (chronic obstructive pulmonary disease) (HCC)   Chronic diastolic CHF (congestive heart failure) (HCC)   Acute on chronic respiratory failure with hypoxia (HCC)   GERD (gastroesophageal reflux disease)   Seizures (HCC)   Type 2 diabetes mellitus without complication, with long-term current use of insulin (HCC)   Severe hypoxic-ischemic encephalopathy   Tracheostomy dependence (HCC)   Pressure injury of skin   Acute on chronic hypoxic respiratory failure Likely due to dynamic airway collapse, also has advanced COPD ,now vent dependent at night There is a concern of aspiration pneumonia [name] /sepsis on presentation, ID consulted think infection is less likely with reassuring procalcitonin, positive blood culture likely contamination due to bedbound status , she was briefly on antibiotic for 4 days initially, antibiotic discontinued per ID recommendation  Anoxic brain injury, vegetative state ,bedbound status, nonverbal at baseline Status post trach and PEG She is seen by neurology does not appear to have meaningful  recovery  Seizure disorders, with history of epilepsy prior to anoxic brain injury, worse since PEA arrest She is evaluated by neurology, was briefly on propofol drip Had several EEG study She is currently on clonazepam, Keppra, valproic acid Valproic acid level 56 on 7/20  Insulin-dependent type 2 diabetes A1c 7.0 Has low bg on 7/22-7/23, decrease scheduled novolog q4hrs, continue adjust insulin    History of CVA Continue Plavix  Hypertension Discontinue metoprolol due to soft BP A.m. cortisol level unremarkable Consider midodrine if BP remains to be low Monitor  Chronic diastolic CHF With preserved left ventricular EF She does has edema on exam likely from volume overload/third spacing , blood pressure has improved after discontinue betablocker, blood pressure has improved, one dose of lasix 73m iv on 7/24, monitor   Obesity: Body mass index is 35.93 kg/m..Marland KitchenSeen by dietician.  I agree with the assessment and plan as outlined below: Nutrition Status: Nutrition Problem: Increased nutrient needs Etiology: wound healing Signs/Symptoms: estimated needs Interventions: Tube feeding  . Sacral wound, present on admission  moisture related seen by wound care  Clean the sacrum with soap and water, rinse, pat dry. Place a piece of Xeroform gauze on the open wounds and secure with foam dressing. Change daily  Failure to thrive, poor prognosis, seen by palliative care, remain full code, palliative care signed off  Unresulted Labs (From admission, onward)     Start     Ordered   11/15/20 04888 Basic metabolic panel  Every Wednesday,   R     Question:  Specimen collection method  Answer:  Lab=Lab collect   11/09/20 0815   11/15/20  0815  CBC  Every Wednesday,   R     Question:  Specimen collection method  Answer:  Lab=Lab collect   11/09/20 0815              DVT prophylaxis:   Lovenox   Code Status: Full Family Communication: None at bedside Disposition:   Status  is: Inpatient   Dispo: The patient is from: Skilled nursing facility              Anticipated d/c is to: To be determined              Anticipated d/c date is: TBD,  difficult placement                Consultants:  Pulmonary critical care Infectious disease Palliative care Neurology  Procedures:  Bronchoscopy on 6/16, 6/23 EEG 6/25, 6/27, 6/28  Antimicrobials:    Anti-infectives (From admission, onward)    Start     Dose/Rate Route Frequency Ordered Stop   10/07/20 1400  vancomycin (VANCOREADY) IVPB 1500 mg/300 mL  Status:  Discontinued        1,500 mg 150 mL/hr over 120 Minutes Intravenous Every 24 hours 10/07/20 1223 10/09/20 0829   10/07/20 1200  ceFEPIme (MAXIPIME) 2 g in sodium chloride 0.9 % 100 mL IVPB  Status:  Discontinued        2 g 200 mL/hr over 30 Minutes Intravenous Every 8 hours 10/07/20 0828 10/07/20 1221   10/02/20 1400  vancomycin (VANCOREADY) IVPB 1250 mg/250 mL  Status:  Discontinued        1,250 mg 166.7 mL/hr over 90 Minutes Intravenous Every 24 hours 10/04/2020 1313 10/04/20 1221   10/07/2020 2200  ceFEPIme (MAXIPIME) 2 g in sodium chloride 0.9 % 100 mL IVPB  Status:  Discontinued        2 g 200 mL/hr over 30 Minutes Intravenous Every 8 hours 10/07/2020 1313 10/04/20 1221   10/10/2020 2200  metroNIDAZOLE (FLAGYL) IVPB 500 mg  Status:  Discontinued        500 mg 100 mL/hr over 60 Minutes Intravenous Every 8 hours 09/23/2020 1616 10/04/20 1221   10/17/2020 1330  vancomycin (VANCOREADY) IVPB 2000 mg/400 mL        2,000 mg 200 mL/hr over 120 Minutes Intravenous  Once 09/30/2020 1257 09/27/2020 1550   09/29/2020 1300  ceFEPIme (MAXIPIME) 2 g in sodium chloride 0.9 % 100 mL IVPB        2 g 200 mL/hr over 30 Minutes Intravenous  Once 10/04/2020 1257 10/14/2020 1330   10/19/2020 1300  metroNIDAZOLE (FLAGYL) IVPB 500 mg        500 mg 100 mL/hr over 60 Minutes Intravenous  Once 10/06/2020 1257 10/10/2020 1443          Objective: Vitals:   11/12/20 0800 11/12/20 0845 11/12/20  1027 11/12/20 1115  BP: 95/67     Pulse: 83   98  Resp: 15   20  Temp:      TempSrc:      SpO2: 99% 96% 96% 100%  Weight:      Height:        Intake/Output Summary (Last 24 hours) at 11/12/2020 1454 Last data filed at 11/12/2020 0700 Gross per 24 hour  Intake 1105 ml  Output 1250 ml  Net -145 ml   Filed Weights   11/08/20 0500 11/09/20 0413 11/12/20 0313  Weight: 90.6 kg 90.1 kg 89.1 kg    Examination:  General exam: open eyes  briefly to voice, does not follow command, nonverbal Respiratory system: Trach dependent , diminished at bases . Cardiovascular system: S1 & S2 heard, RRR. Marland Kitchen Gastrointestinal system: Positive PEG , abdomen is nondistended, soft and nontender.  Normal bowel sounds heard. Central nervous system: Open eyes to voice briefly. Extremities: No spontaneous movement Skin: No rashes, lesions or ulcers Psychiatry: Vegetative state    Data Reviewed: I have personally reviewed following labs and imaging studies  CBC: Recent Labs  Lab 11/07/20 0646 11/09/20 0211  WBC 9.7 7.3  NEUTROABS  --  4.1  HGB 10.0* 9.5*  HCT 33.1* 31.3*  MCV 87.8 88.7  PLT 360 517    Basic Metabolic Panel: Recent Labs  Lab 11/07/20 0646 11/09/20 0211  NA 139 141  K 4.4 3.9  CL 104 106  CO2 27 28  GLUCOSE 114* 109*  BUN 24* 31*  CREATININE 0.53 0.49  CALCIUM 10.8* 10.6*  MG  --  2.0  PHOS  --  3.0    GFR: Estimated Creatinine Clearance: 71.7 mL/min (by C-G formula based on SCr of 0.49 mg/dL).  Liver Function Tests: No results for input(s): AST, ALT, ALKPHOS, BILITOT, PROT, ALBUMIN in the last 168 hours.   CBG: Recent Labs  Lab 11/11/20 1937 11/11/20 2317 11/12/20 0330 11/12/20 0802 11/12/20 1111  GLUCAP 111* 134* 107* 103* 106*     Recent Results (from the past 240 hour(s))  MRSA Next Gen by PCR, Nasal     Status: Abnormal   Collection Time: 11/09/20 10:12 AM   Specimen: Nasal Mucosa; Nasal Swab  Result Value Ref Range Status   MRSA by PCR Next  Gen DETECTED (A) NOT DETECTED Final    Comment: RESULT CALLED TO, READ BACK BY AND VERIFIED WITH: L BASS RN [id number] 11/09/20 A BROWNING (NOTE) The GeneXpert MRSA Assay (FDA approved for NASAL specimens only), is one component of a comprehensive MRSA colonization surveillance program. It is not intended to diagnose MRSA infection nor to guide or monitor treatment for MRSA infections. Test performance is not FDA approved in patients less than 8 years old. Performed at [facility], [place] [address], [place], [place] [address]          Radiology Studies: DG CHEST PORT 1 VIEW  Result Date: 11/12/2020 CLINICAL DATA:  Hypoxia EXAM: PORTABLE CHEST 1 VIEW COMPARISON:  October 30, 2020 FINDINGS: The tracheostomy tube is in good position. No pneumothorax. Mild atelectasis in the left base. The heart, hila, mediastinum, lungs, and pleura are otherwise unchanged and unremarkable. IMPRESSION: Stable tracheostomy tube in good position.  No acute abnormalities. Electronically Signed   By: Dorise Bullion III M.D   On: 11/12/2020 12:16        Scheduled Meds:  arformoterol  15 mcg Nebulization BID   baclofen  5 mg Per Tube TID   budesonide  0.5 mg Nebulization BID   chlorhexidine gluconate (MEDLINE KIT)  15 mL Mouth Rinse BID   Chlorhexidine Gluconate Cloth  6 each Topical Q0600   clonazePAM  0.5 mg Per Tube BID   clopidogrel  75 mg Per Tube Daily   enoxaparin (LOVENOX) injection  0.5 mg/kg Subcutaneous Q24H   famotidine  20 mg Per Tube BID   feeding supplement (PROSource TF)  45 mL Per Tube BID   fiber  1 packet Per Tube BID   furosemide  20 mg Intravenous Once   Gerhardt's butt cream   Topical BID   guaiFENesin  5 mL Per Tube Q6H  hydrocortisone cream   Topical TID   insulin aspart  0-15 Units Subcutaneous Q4H   insulin aspart  2 Units Subcutaneous Q4H   insulin glargine  40 Units Subcutaneous QHS   levETIRAcetam  2,500 mg Per Tube BID   loratadine  10 mg Per Tube Daily   mouth rinse   15 mL Mouth Rinse 10 times per day   montelukast  10 mg Per Tube Daily   mupirocin ointment  1 application Nasal BID   nutrition supplement (JUVEN)  1 packet Per Tube BID   revefenacin  175 mcg Nebulization Daily   tiZANidine  2 mg Per Tube QHS   valproic acid  1,000 mg Per Tube Q12H   Continuous Infusions:  sodium chloride     feeding supplement (JEVITY 1.5 CAL/FIBER) 1,000 mL (11/12/20 0400)     LOS: 42 days   Time spent: 25 mins Greater than 50% of this time was spent in counseling, explanation of diagnosis, planning of further management, and coordination of care.   Voice Recognition Viviann Spare dictation system was used to create this note, attempts have been made to correct errors. Please contact the author with questions and/or clarifications.   Florencia Reasons, MD PhD FACP [facility]  Available via Epic secure chat 7am-7pm for nonurgent issues Please page for urgent issues To page the attending provider between 7A-7P or the covering provider during after hours 7P-7A, please log into the web site [email or web address] and access using universal [place] password for that web site. If you do not have the password, please call the hospital operator.    11/12/2020, 2:54 PM

## 2020-11-12 NOTE — Progress Notes (Signed)
RT note-Patient with continued coughing and bronchospasm, BBS very coarse and diminished on left, patient was bagged and lavaged. PIP's continue to remain in the 40's with decreased BS on left. Patient was suctioned again and moderate to large amount pink frothy secretions, RN aware and message sent to [facility]. BBS somewhat improved at this time, with PIP's decreased. Continue to monitor.

## 2020-11-13 DIAGNOSIS — J9621 Acute and chronic respiratory failure with hypoxia: Secondary | ICD-10-CM | POA: Diagnosis not present

## 2020-11-13 LAB — GLUCOSE, CAPILLARY
Glucose-Capillary: 117 mg/dL — ABNORMAL HIGH (ref 70–99)
Glucose-Capillary: 107 mg/dL — ABNORMAL HIGH (ref 70–99)
Glucose-Capillary: 134 mg/dL — ABNORMAL HIGH (ref 70–99)
Glucose-Capillary: 118 mg/dL — ABNORMAL HIGH (ref 70–99)
Glucose-Capillary: 124 mg/dL — ABNORMAL HIGH (ref 70–99)
Glucose-Capillary: 128 mg/dL — ABNORMAL HIGH (ref 70–99)

## 2020-11-13 MED ORDER — CHLORHEXIDINE GLUCONATE CLOTH 2 % EX PADS
6.0000 | MEDICATED_PAD | Freq: Every day | CUTANEOUS | Status: DC
  Administered 2020-11-14 – 2020-12-21 (×39): 6 via TOPICAL

## 2020-11-13 NOTE — Plan of Care (Signed)
  Problem: Clinical Measurements: Goal: Will remain free from infection Outcome: Progressing Goal: Diagnostic test results will improve Outcome: Progressing Goal: Respiratory complications will improve Outcome: Progressing Goal: Cardiovascular complication will be avoided Outcome: Progressing   Problem: Activity: Goal: Risk for activity intolerance will decrease Outcome: Progressing   Problem: Nutrition: Goal: Adequate nutrition will be maintained Outcome: Progressing   Problem: Elimination: Goal: Will not experience complications related to bowel motility Outcome: Progressing Goal: Will not experience complications related to urinary retention Outcome: Progressing   Problem: Pain Managment: Goal: General experience of comfort will improve Outcome: Progressing   Problem: Safety: Goal: Ability to remain free from injury will improve Outcome: Progressing   Problem: Skin Integrity: Goal: Risk for impaired skin integrity will decrease Outcome: Progressing   Problem: Activity: Goal: Ability to tolerate increased activity will improve Outcome: Progressing   Problem: Respiratory: Goal: Ability to maintain a clear airway and adequate ventilation will improve Outcome: Progressing   Problem: Education: Goal: Knowledge about tracheostomy care/management will improve Outcome: Progressing   Problem: Activity: Goal: Ability to tolerate increased activity will improve Outcome: Progressing   Problem: Health Behavior/Discharge Planning: Goal: Ability to manage tracheostomy will improve Outcome: Progressing   Problem: Respiratory: Goal: Patent airway maintenance will improve Outcome: Progressing   Problem: Role Relationship: Goal: Ability to communicate will improve Outcome: Progressing

## 2020-11-13 NOTE — TOC Progression Note (Signed)
Transition of Care [facility]) - Progression Note    Patient Details  Name: Sonia Drake MRN: [id number] Date of Birth: 1955-10-12  Transition of [facility]) CM/SW Contact  Erin Sons, [place] Phone Number: 11/13/2020, 10:53 AM  Clinical Narrative:     CSW received call from Haskel Schroeder, liaison LTACH. CSW called back and discussed referral for LTACH. She will review clinicals and call CSW back.   Expected Discharge Plan: Skilled Nursing Facility Barriers to Discharge: No SNF bed  Expected Discharge Plan and Services Expected Discharge Plan: Skilled Nursing Facility   Discharge Planning Services: CM Consult                                           Social Determinants of Health (SDOH) Interventions    Readmission Risk Interventions No flowsheet data found.

## 2020-11-13 NOTE — Progress Notes (Signed)
PROGRESS NOTE    Sonia Drake  VVO:[id number] DOB: 06-Jan-1956 DOA: 10/04/2020 PCP: Garwin Brothers, MD    Chief Complaint  Patient presents with   Respiratory Distress    Brief Narrative:  65 y.o from SNF , Hx of DM2, CHF, h/o cardiac arrest in 11/2019 result in severe anoxic brain injury, s/p PEG tube, s/p chronic trach presents with acute on chronic respiratory failure.  Bronchoscopy performed on 6/16 showed dynamic airway collapse, tracheal collapsenow vent-dependent, not able to return to SNF.  Assessment & Plan:   Principal Problem:   Sepsis ([place]) Active Problems:   COPD (chronic obstructive pulmonary disease) (HCC)   Chronic diastolic CHF (congestive heart failure) (HCC)   Acute on chronic respiratory failure with hypoxia (HCC)   GERD (gastroesophageal reflux disease)   Seizures (HCC)   Type 2 diabetes mellitus without complication, with long-term current use of insulin (HCC)   Severe hypoxic-ischemic encephalopathy   Tracheostomy dependence (HCC)   Pressure injury of skin    Acute on chronic hypoxic respiratory failure Likely due to dynamic airway collapse, also has advanced COPD ,now vent dependent at night There is a concern of aspiration pneumonia Sonia Drake /sepsis on presentation, ID consulted think infection is less likely with reassuring procalcitonin, positive blood culture likely contamination due to bedbound status , she was briefly on antibiotic for 4 days initially, antibiotic discontinued per ID recommendation   Anoxic brain injury, vegetative state ,bedbound status, nonverbal at baseline Status post trach and PEG She is seen by neurology does not appear to have meaningful recovery   Seizure disorders, with history of epilepsy prior to anoxic brain injury, worse since PEA arrest She is evaluated by neurology, was briefly on propofol drip Had several EEG study She is currently on clonazepam, Keppra, valproic acid Valproic acid level 56 on 7/20    Insulin-dependent type 2 diabetes A1c 7.0 Has low bg on 7/22-7/23, decrease scheduled novolog q4hrs, continue adjust insulin     History of CVA Continue Plavix   Hypertension Discontinue metoprolol due to soft BP A.m. cortisol level unremarkable Consider midodrine if BP remains to be low Monitor   Chronic diastolic CHF With preserved left ventricular EF She does has edema on exam likely from volume overload/third spacing , blood pressure has improved after discontinue betablocker, blood pressure has improved, one dose of lasix $Remove'20mg'NIBkyeE$  iv on 7/24, monitor    Obesity: Body mass index is 35.93 kg/m.Marland Kitchen Seen by dietician.  I agree with the assessment and plan as outlined below: Nutrition Status: Nutrition Problem: Increased nutrient needs Etiology: wound healing Signs/Symptoms: estimated needs Interventions: Tube feeding   . Sacral wound, present on admission moisture related seen by wound care  Clean the sacrum with soap and water, rinse, pat dry. Place a piece of Xeroform gauze on the open wounds and secure with foam dressing. Change daily   Failure to thrive, poor prognosis, seen by palliative care, remain full code, palliative care signed off   DVT prophylaxis: (Lovenox) Code Status: (Full Code) Family Communication: none at bedside.  Disposition:   Status is: Inpatient  Remains inpatient appropriate because:Unsafe d/c plan  Dispo: The patient is from: SNF              Anticipated d/c is to: LTAC              Patient currently is medically stable to d/c.   Difficult to place patient No       Consultants:  PCCM ID PALLIATIVE CARE NEUROLOGY  Procedures:  Bronchoscopy EEG.   Antimicrobials: none.    Subjective: No complaints overnight.   Objective: Vitals:   11/13/20 1400 11/13/20 1500 11/13/20 1544 11/13/20 1600  BP: 107/69 103/62  104/66  Pulse: 81 86  89  Resp: 20 (!) 21  (!) 21  Temp:      TempSrc:      SpO2: 100% 100% 100% 100%  Weight:       Height:        Intake/Output Summary (Last 24 hours) at 11/13/2020 1644 Last data filed at 11/13/2020 1600 Gross per 24 hour  Intake 1930 ml  Output 700 ml  Net 1230 ml   Filed Weights   11/09/20 0413 11/12/20 0313 11/13/20 0355  Weight: 90.1 kg 89.1 kg 89.5 kg    Examination:  General exam: Appears calm and comfortable  Respiratory system: Clear to auscultation. Respiratory effort normal. Cardiovascular system: S1 & S2 heard, RRR. No JVD,  No pedal edema. Gastrointestinal system: Abdomen is nondistended, soft and nontender.  Normal bowel sounds heard. Central nervous system: Alert and oriented. No focal neurological deficits. Extremities: Symmetric 5 x 5 power. Skin: No rashes, lesions or ulcers Psychiatry: Mood & affect appropriate.     Data Reviewed: I have personally reviewed following labs and imaging studies  CBC: Recent Labs  Lab 11/07/20 0646 11/09/20 0211  WBC 9.7 7.3  NEUTROABS  --  4.1  HGB 10.0* 9.5*  HCT 33.1* 31.3*  MCV 87.8 88.7  PLT 360 007    Basic Metabolic Panel: Recent Labs  Lab 11/07/20 0646 11/09/20 0211  NA 139 141  K 4.4 3.9  CL 104 106  CO2 27 28  GLUCOSE 114* 109*  BUN 24* 31*  CREATININE 0.53 0.49  CALCIUM 10.8* 10.6*  MG  --  2.0  PHOS  --  3.0    GFR: Estimated Creatinine Clearance: 72 mL/min (by C-G formula based on SCr of 0.49 mg/dL).  Liver Function Tests: No results for input(s): AST, ALT, ALKPHOS, BILITOT, PROT, ALBUMIN in the last 168 hours.  CBG: Recent Labs  Lab 11/12/20 2347 11/13/20 0317 11/13/20 0749 11/13/20 1110 11/13/20 1545  GLUCAP 126* 117* 107* 134* 118*     Recent Results (from the past 240 hour(s))  MRSA Next Gen by PCR, Nasal     Status: Abnormal   Collection Time: 11/09/20 10:12 AM   Specimen: Nasal Mucosa; Nasal Swab  Result Value Ref Range Status   MRSA by PCR Next Gen DETECTED (A) NOT DETECTED Final    Comment: RESULT CALLED TO, READ BACK BY AND VERIFIED WITH: L BASS RN [id number]  11/09/20 A BROWNING (NOTE) The GeneXpert MRSA Assay (FDA approved for NASAL specimens only), is one component of a comprehensive MRSA colonization surveillance program. It is not intended to diagnose MRSA infection nor to guide or monitor treatment for MRSA infections. Test performance is not FDA approved in patients less than 33 years old. Performed at [facility], [place] [address], [place], [place] [address]          Radiology Studies: DG CHEST PORT 1 VIEW  Result Date: 11/12/2020 CLINICAL DATA:  Hypoxia EXAM: PORTABLE CHEST 1 VIEW COMPARISON:  October 30, 2020 FINDINGS: The tracheostomy tube is in good position. No pneumothorax. Mild atelectasis in the left base. The heart, hila, mediastinum, lungs, and pleura are otherwise unchanged and unremarkable. IMPRESSION: Stable tracheostomy tube in good position.  No acute abnormalities. Electronically Signed   By: Dorise Bullion III M.D  On: 11/12/2020 12:16        Scheduled Meds:  arformoterol  15 mcg Nebulization BID   baclofen  5 mg Per Tube TID   budesonide  0.5 mg Nebulization BID   chlorhexidine gluconate (MEDLINE KIT)  15 mL Mouth Rinse BID   Chlorhexidine Gluconate Cloth  6 each Topical Daily   clonazePAM  0.5 mg Per Tube BID   clopidogrel  75 mg Per Tube Daily   enoxaparin (LOVENOX) injection  0.5 mg/kg Subcutaneous Q24H   famotidine  20 mg Per Tube BID   feeding supplement (PROSource TF)  45 mL Per Tube BID   fiber  1 packet Per Tube BID   Gerhardt's butt cream   Topical BID   guaiFENesin  5 mL Per Tube Q6H   hydrocortisone cream   Topical TID   insulin aspart  0-15 Units Subcutaneous Q4H   insulin aspart  2 Units Subcutaneous Q4H   insulin glargine  40 Units Subcutaneous QHS   levETIRAcetam  2,500 mg Per Tube BID   loratadine  10 mg Per Tube Daily   mouth rinse  15 mL Mouth Rinse 10 times per day   montelukast  10 mg Per Tube Daily   mupirocin ointment  1 application Nasal BID   nutrition supplement (JUVEN)   1 packet Per Tube BID   revefenacin  175 mcg Nebulization Daily   tiZANidine  2 mg Per Tube QHS   valproic acid  1,000 mg Per Tube Q12H   Continuous Infusions:  sodium chloride     feeding supplement (JEVITY 1.5 CAL/FIBER) 1,000 mL (11/13/20 0300)     LOS: 43 days        Hosie Poisson, MD [facility]   To contact the attending provider between 7A-7P or the covering provider during after hours 7P-7A, please log into the web site [email or web address] and access using universal [place] password for that web site. If you do not have the password, please call the hospital operator.  11/13/2020, 4:44 PM

## 2020-11-13 NOTE — Plan of Care (Signed)

## 2020-11-14 DIAGNOSIS — J9621 Acute and chronic respiratory failure with hypoxia: Secondary | ICD-10-CM | POA: Diagnosis not present

## 2020-11-14 LAB — GLUCOSE, CAPILLARY
Glucose-Capillary: 103 mg/dL — ABNORMAL HIGH (ref 70–99)
Glucose-Capillary: 112 mg/dL — ABNORMAL HIGH (ref 70–99)
Glucose-Capillary: 144 mg/dL — ABNORMAL HIGH (ref 70–99)
Glucose-Capillary: 179 mg/dL — ABNORMAL HIGH (ref 70–99)
Glucose-Capillary: 88 mg/dL (ref 70–99)
Glucose-Capillary: 98 mg/dL (ref 70–99)

## 2020-11-14 NOTE — Progress Notes (Signed)
Nutrition Follow-up  DOCUMENTATION CODES:   Not applicable  INTERVENTION:   Continue tube feeds via PEG: - Jevity 1.5 @ 45 ml/hr (1080 ml/day) - ProSource TF 45 ml BID   Tube feeding regimen provides 1700 kcal, 91 grams of protein, and 821 ml of H2O.   - Continue Juven BID via PEG, each packet provides 80 calories, 8 grams of carbohydrate, 2.5  grams of protein, 7 grams of L-arginine and 7 grams of L-glutamine; supplement contains CaHMB, vitamins C, E, B12 and zinc to promote wound healing  NUTRITION DIAGNOSIS:   Increased nutrient needs related to wound healing as evidenced by estimated needs.  Ongoing, being addressed via TF  GOAL:   Patient will meet greater than or equal to 90% of their needs  Met via TF  MONITOR:   Vent status, Labs, Weight trends, TF tolerance, Skin, I & O's  REASON FOR ASSESSMENT:   Consult Enteral/tube feeding initiation and management  ASSESSMENT:   Sonia Drake is a 65 y.o. female with medical history significant of asthma/COPD, CHF, cardiac arrest with PEA/CPR with chronic respiratory failure on permanent trach with supplemental oxygen at 6L, type 2 DM, GERD, seizures and severe hypoxic-ischemic encephalopathy who presented to ER via EMS for respiratory distress from SNF.  Discussed pt with RN and during ICU rounds. Pt is on and off trach collar as tolerated. Per CCM, pt failed trach collar this morning and is current on vent support via trach.  Tube feeds infusing at goal via PEG. Pt tolerating current tube feeds without issue.  Pt still with mild pitting generalized edema.  Admit weight: 88.4 kg Current weight: 89.5 kg  Current TF: Jevity 1.5 @ 45 ml/hr, ProSource TF 45 ml BID  Medications reviewed and include: pepcid, nutrisource fiber, SSI q 4 hours, novolog 2 units q 4 hours, lantus 40 units daily, Juven BID  Labs reviewed. CBG's: 98-179 x 24 hours  UOP: 800 ml x 24 hours  Diet Order:   Diet Order             Diet NPO  time specified  Diet effective now                   EDUCATION NEEDS:   No education needs have been identified at this time  Skin:  Skin Assessment: Skin Integrity Issues: Other: MASD to buttocks  Last BM:  11/14/20 type 6  Height:   Ht Readings from Last 1 Encounters:  10/12/20 _0  (1.575 m)    Weight:   Wt Readings from Last 1 Encounters:  11/13/20 89.5 kg    Ideal Body Weight:  45.5 kg  BMI:  Body mass index is 36.09 kg/m.  Estimated Nutritional Needs:   Kcal:  1600-1800  Protein:  90-105 grams  Fluid:  > 1.6 L    Gustavus Bryant, MS, RD, LDN Inpatient Clinical Dietitian Please see AMiON for contact information.

## 2020-11-14 NOTE — Progress Notes (Signed)
PROGRESS NOTE    Sonia Drake  CZY:[id number] DOB: 1955-12-25 DOA: 09/29/2020 PCP: Garwin Brothers, MD    Chief Complaint  Patient presents with   Respiratory Distress    Brief Narrative:   65 y.o from SNF , Hx of DM2, CHF, h/o cardiac arrest in 11/2019 result in severe anoxic brain injury, s/p PEG tube, s/p chronic trach presents with acute on chronic respiratory failure.   Bronchoscopy performed on 6/16 showed dynamic airway collapse, tracheal collapsenow vent-dependent, not able to return to SNF   Assessment & Plan:   Principal Problem:   Sepsis ([place]) Active Problems:   COPD (chronic obstructive pulmonary disease) (HCC)   Chronic diastolic CHF (congestive heart failure) (HCC)   Acute on chronic respiratory failure with hypoxia (HCC)   GERD (gastroesophageal reflux disease)   Seizures (HCC)   Type 2 diabetes mellitus without complication, with long-term current use of insulin (HCC)   Severe hypoxic-ischemic encephalopathy   Tracheostomy dependence ([place])   Acute on chronic respiratory failure Probably secondary to dynamic airway collapse.  Currently vent dependent There was a concern for aspiration pneumonia/bacteremia and sepsis on presentation.  Sepsis ruled out.  ID recommends and suggest infection is less likely with reassuring procalcitonin.  She was initially on antibiotics briefly which were discontinued.     Anoxic brain injury, vegetative state, bedbound status and nonverbal at baseline S/p trach and PEG Neurology suggested patient does not appear to have meaningful recovery   Seizure disorder in the setting of anoxic brain injury Patient currently on clonazepam, Keppra and valproic acid.    Insulin-dependent diabetes mellitus Hemoglobin A1c at 7 Continue sliding scale insulin.    History of CVA Continue with Plavix.  Chronic diastolic heart failure She appears to be euvolemic at this time.   Essential hypertension Blood pressure parameters appear  to be borderline low   Body mass index is 36.09 kg/m. Dietary on board.         DVT prophylaxis: (Lovenox. ) Code Status: (Full Code) Family Communication: none at bedside.  Disposition:   Status is: Inpatient  Remains inpatient appropriate because:Unsafe d/c plan  Dispo: The patient is from: SNF              Anticipated d/c is to: SNF              Patient currently is not medically stable to d/c.   Difficult to place patient No       Level of care: ICU Consultants:    PCCM.  Procedures:none.   Antimicrobials: none.    Subjective: No overnight event.   Objective: Vitals:   11/14/20 0747 11/14/20 0800 11/14/20 1137 11/14/20 1152  BP: (!) 91/55 (!) 83/41 120/80   Pulse: 87 79 99   Resp: _0 Temp:    98.7 F (37.1 C)  TempSrc:    Axillary  SpO2: 100% 100% 100%   Weight:      Height:        Intake/Output Summary (Last 24 hours) at 11/14/2020 1443 Last data filed at 11/14/2020 0500 Gross per 24 hour  Intake 675 ml  Output 300 ml  Net 375 ml   Filed Weights   11/09/20 0413 11/12/20 0313 11/13/20 0355  Weight: 90.1 kg 89.1 kg 89.5 kg    Examination:  General exam: alert, ill appearing, on vent.  Respiratory system: air entry fair, no wheezing heard, on VENT s/p trach Cardiovascular system: S1 & S2 heard, RRR., No pedal edema.  Gastrointestinal system: Abdomen is soft, NT ND BS+ Central nervous system: alert, but does not follow commands , is not tracking.  Extremities: no pedal edema.  Skin: sacral pressure injury.  Psychiatry:cannot be assessed.     Data Reviewed: I have personally reviewed following labs and imaging studies  CBC: Recent Labs  Lab 11/09/20 0211  WBC 7.3  NEUTROABS 4.1  HGB 9.5*  HCT 31.3*  MCV 88.7  PLT 300    Basic Metabolic Panel: Recent Labs  Lab 11/09/20 0211  NA 141  K 3.9  CL 106  CO2 28  GLUCOSE 109*  BUN 31*  CREATININE 0.49  CALCIUM 10.6*  MG 2.0  PHOS 3.0    GFR: Estimated  Creatinine Clearance: 72 mL/min (by C-G formula based on SCr of 0.49 mg/dL).  Liver Function Tests: No results for input(s): AST, ALT, ALKPHOS, BILITOT, PROT, ALBUMIN in the last 168 hours.  CBG: Recent Labs  Lab 11/13/20 1940 11/13/20 2324 11/14/20 0329 11/14/20 0729 11/14/20 1102  GLUCAP 124* 128* 98 179* 112*     Recent Results (from the past 240 hour(s))  MRSA Next Gen by PCR, Nasal     Status: Abnormal   Collection Time: 11/09/20 10:12 AM   Specimen: Nasal Mucosa; Nasal Swab  Result Value Ref Range Status   MRSA by PCR Next Gen DETECTED (A) NOT DETECTED Final    Comment: RESULT CALLED TO, READ BACK BY AND VERIFIED WITH: L BASS RN [id number] 11/09/20 A BROWNING (NOTE) The GeneXpert MRSA Assay (FDA approved for NASAL specimens only), is one component of a comprehensive MRSA colonization surveillance program. It is not intended to diagnose MRSA infection nor to guide or monitor treatment for MRSA infections. Test performance is not FDA approved in patients less than 2 years old. Performed at [facility], [address] [place], [place] [address]          Radiology Studies: No results found.      Scheduled Meds:  arformoterol  15 mcg Nebulization BID   baclofen  5 mg Per Tube TID   budesonide  0.5 mg Nebulization BID   chlorhexidine gluconate (MEDLINE KIT)  15 mL Mouth Rinse BID   Chlorhexidine Gluconate Cloth  6 each Topical Daily   clonazePAM  0.5 mg Per Tube BID   clopidogrel  75 mg Per Tube Daily   enoxaparin (LOVENOX) injection  0.5 mg/kg Subcutaneous Q24H   famotidine  20 mg Per Tube BID   feeding supplement (PROSource TF)  45 mL Per Tube BID   fiber  1 packet Per Tube BID   Gerhardt's butt cream   Topical BID   guaiFENesin  5 mL Per Tube Q6H   hydrocortisone cream   Topical TID   insulin aspart  0-15 Units Subcutaneous Q4H   insulin aspart  2 Units Subcutaneous Q4H   insulin glargine  40 Units Subcutaneous QHS   levETIRAcetam  2,500 mg Per  Tube BID   loratadine  10 mg Per Tube Daily   mouth rinse  15 mL Mouth Rinse 10 times per day   montelukast  10 mg Per Tube Daily   nutrition supplement (JUVEN)  1 packet Per Tube BID   revefenacin  175 mcg Nebulization Daily   tiZANidine  2 mg Per Tube QHS   valproic acid  1,000 mg Per Tube Q12H   Continuous Infusions:  sodium chloride     feeding supplement (JEVITY 1.5 CAL/FIBER) 1,000 mL (11/13/20 0300)       LOS: 44 days        Vijaya Akula, MD [facility]   To contact the attending provider between 7A-7P or the covering provider during after hours 7P-7A, please log into the web site [email or web address] and access using universal  password for that web site. If you do not have the password, please call the hospital operator.  11/14/2020, 2:43 PM    

## 2020-11-14 NOTE — Progress Notes (Signed)
NAME:  Sonia Drake, MRN:  [id number], DOB:  August 04, 1955, LOS: 44 ADMISSION DATE:  10-04-2020, CONSULTATION DATE:  6/15 REFERRING MD:  Thedore Mins, CHIEF COMPLAINT:  Dyspnea   History of Present Illness:  65 y/o female with anoxic brain injury, tracheostomy status who was admitted from a SNF on 6/12 with worsening respiratory failure.  She had previously been ventilator dependent but had been weaned from the trach.  On 6/16 a bronchoscopy revealed dynamic collapse of her trachea, tracheostomy was replaced.  Later required mechanical ventilation.   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 No distress. WBC ct trending down. No events overnight growing GPC clusters in two of two cultures  6/16 increased work of breathing, upper airway noise, abdominal muscle use.  Bronchoscopy performed as trach exchange cuffless #8 Shiley XLT showed dynamic airway collapse, tracheal collapse. 6/16 continued respiratory distress moved to ICU, changed to cuffed #6 Shiley XLT, placed on MV with some improved comfort 6/12 cefepime > 6/14, 6/18 6/12 vanc > 6/14, 6/18  6/23 mucus plugging, severe vent dyssynchrony 6/24-25 intermittent jerking, seizures on EEG 6/27 weaned off propofol gtt  6/28 briefly tolerated trach collar in AM 6/29 tolerated trach collar in AM; no further seizure like activity; working with CM/SW for vent-SNF 7/1 Weaning on CPAP/ PS 10/5, CM SW working on [employer], NO seizure activity, secretions are not an issue per nursing 7/11 trach changed  to #8 Bivona with air cuff, much improved. 7/12 TCT 12 hours; rested on Vent overnight 7/13 on TCT 7/26>> Remains on full vent support this am, no issues with secretions per nursing   Interim History / Subjective:  Tmax 98.3 800 cc's  UOP las 24 hours +370 past 24 hours +24 L admit  TC for 4 hours past 24 hours No labs to review No imaging to review  Unable to obtain subjective evaluation due to patient status  Objective   Blood pressure (!) 134/90, pulse 100, temperature 97.7 F (36.5 C), temperature source Oral, resp. rate (!) 27, height 5\' 2"  (1.575 m), weight 91.4 kg, SpO2 96 %.    Vent Mode: PRVC FiO2 (%):  [30 %-40 %] 40 % Set Rate:  [20 bmp] 20 bmp Vt Set:  [400 mL] 400 mL PEEP:  [5 cmH20] 5 cmH20 Plateau Pressure:  [13 cmH20-19 cmH20] 15 cmH20   Intake/Output Summary (Last 24 hours) at 11/14/2020 0813 Last data filed at 11/14/2020 0500 Gross per 24 hour  Intake 1125 ml  Output 800 ml  Net 325 ml   Filed Weights   11/09/20 0413 11/12/20 0313 11/13/20 0355  Weight: 90.1 kg 89.1 kg 89.5 kg    Examination: General:  in bed, NAD, chronically ill appearing HEENT: MM pink/moist, anicteric, atraumatic, 8.0 trach c/d/i Neuro: PERRL 23mm, not tracking, No focus, opens eyes to stimulation CV: S1S2, NSR, no m/r/g appreciated PULM:   bilateral chest excursion,  Trachea midline, chest expansion symmetric, rhonchi throughout GI: soft, bsx4 active, non distended, PEG with TF infusing at goal Extremities: warm/dry, trace edema, capillary refill less than 3 seconds , foot drop, soft boots in place  Studies:  VQ  scan 6/15> Neg for acute PE CT Head wo Contrast 6/24 > No definite acute intracranial abnormality, progressive cerebral atrophy w/chronic small vessel ischemic disease, acute L sphenoid sinusitis MRI Brain wo Contrast 6/28>  advanced atrophy, old Lt occipital infarct  Resolved Hospital Problem list   Circulatory shock Hemoptysis  Assessment & Plan:   Acute on  chronic hypoxic/hypercapnic respiratory failure in setting of anoxic encephalopathy, tracheobronchomalacia, and severe COPD. S/P biovana #8, legnth 140 on 7/11. Still requiring MV at night.  Still requiring MV at night. Failed ATC this am due to RR of 7 and no gag. Remains on full support. P: -Continue trach collar trials -Mechanical venilator on standby for respiratory distress. If requiring MV overnight, try on CPAP/PS.  -Goal SAT 88-96% -Continue brovana, pulmicort, yupelri, singulair -Continue PRN albuterol -Continue VAP prevention -Continue trach care, tracheal suction as needed.  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 Seizure disorder. Hypertension. DM type 2. Hx of CVA. Obesity  P: -Per primary team  Concerning that she was unable to tolerate ATC this am, and finding of no gag.   Labs:   CMP Latest Ref Rng & Units 11/09/2020 11/07/2020 11/03/2020  Glucose 70 - 99 mg/dL 195(K) 932(I) 99  BUN 8 - 23 mg/dL 71(I) 45(Y) 22  Creatinine 0.44 - 1.00 mg/dL 0.99 8.33 8.25  Sodium 135 - 145 mmol/L 141 139 138  Potassium 3.5 - 5.1 mmol/L 3.9 4.4 4.5  Chloride 98 - 111 mmol/L 106 104 103  CO2 22 - 32 mmol/L 28 27 27   Calcium 8.9 - 10.3 mg/dL 10.6(H) 10.8(H) 10.9(H)  Total Protein 6.5 - 8.1 g/dL - - 7.0  Total Bilirubin 0.3 - 1.2 mg/dL - - 0.3  Alkaline Phos 38 - 126 U/L - - 83  AST 15 - 41 U/L - - 16  ALT 0 - 44 U/L - - 14    CBC Latest Ref Rng & Units 11/09/2020 11/07/2020 11/03/2020  WBC 4.0 - 10.5 K/uL 7.3 9.7 10.1  Hemoglobin 12.0 - 15.0 g/dL 11/05/2020) 10.0(L) 10.8(L)  Hematocrit 36.0 - 46.0 % 31.3(L) 33.1(L) 34.6(L)  Platelets 150 - 400 K/uL 300 360 379    ABG    Component Value Date/Time   PHART 7.388 10/27/2020 0356   PCO2ART 50.4 (H) 10/27/2020 0356   PO2ART 127 (H) 10/27/2020 0356   HCO3 30.5 (H) 10/27/2020 0356   TCO2 32 10/27/2020 0356   O2SAT 99.0 10/27/2020 0356    CBG (last 3)  Recent Labs    11/13/20 2324  11/14/20 0329 11/14/20 0729  GLUCAP 128* 98 179*    Signature:   Critical care time: N/A  11/16/20,  MSN, APRN, AGACNP-BC [facility]  11/14/2020 , 8:13 AM  Please see [email or web address] for pager details  If no response, please call [phone]>> Hospital Use only After hours, please call Elink at [phone]>> Hospital Use only

## 2020-11-14 NOTE — Plan of Care (Signed)

## 2020-11-15 DIAGNOSIS — J9621 Acute and chronic respiratory failure with hypoxia: Secondary | ICD-10-CM | POA: Diagnosis not present

## 2020-11-15 LAB — GLUCOSE, CAPILLARY
Glucose-Capillary: 107 mg/dL — ABNORMAL HIGH (ref 70–99)
Glucose-Capillary: 86 mg/dL (ref 70–99)
Glucose-Capillary: 100 mg/dL — ABNORMAL HIGH (ref 70–99)
Glucose-Capillary: 98 mg/dL (ref 70–99)
Glucose-Capillary: 122 mg/dL — ABNORMAL HIGH (ref 70–99)
Glucose-Capillary: 128 mg/dL — ABNORMAL HIGH (ref 70–99)

## 2020-11-15 LAB — CBC
HCT: 32.3 % — ABNORMAL LOW (ref 36.0–46.0)
Hemoglobin: 10 g/dL — ABNORMAL LOW (ref 12.0–15.0)
MCH: 27.3 pg (ref 26.0–34.0)
MCHC: 31 g/dL (ref 30.0–36.0)
MCV: 88.3 fL (ref 80.0–100.0)
Platelets: 249 10*3/uL (ref 150–400)
RBC: 3.66 MIL/uL — ABNORMAL LOW (ref 3.87–5.11)
RDW: 17.1 % — ABNORMAL HIGH (ref 11.5–15.5)
WBC: 8.3 10*3/uL (ref 4.0–10.5)
nRBC: 0 % (ref 0.0–0.2)

## 2020-11-15 LAB — BASIC METABOLIC PANEL
Anion gap: 7 (ref 5–15)
BUN: 29 mg/dL — ABNORMAL HIGH (ref 8–23)
CO2: 31 mmol/L (ref 22–32)
Calcium: 10.8 mg/dL — ABNORMAL HIGH (ref 8.9–10.3)
Chloride: 103 mmol/L (ref 98–111)
Creatinine, Ser: 0.45 mg/dL (ref 0.44–1.00)
GFR, Estimated: >60 mL/min (ref >60)
Glucose, Bld: 85 mg/dL (ref 70–99)
Potassium: 4.2 mmol/L (ref 3.5–5.1)
Sodium: 141 mmol/L (ref 135–145)

## 2020-11-15 NOTE — Progress Notes (Signed)
PROGRESS NOTE    Sonia Drake  MWN:[id number] DOB: [birth year] DOA: 09/22/2020 PCP: Garwin Brothers, MD    Chief Complaint  Patient presents with   Respiratory Distress    Brief Narrative:   65 y.o from SNF , Hx of DM2, CHF, h/o cardiac arrest in 11/2019 result in severe anoxic brain injury, s/p PEG tube, s/p chronic trach presents with acute on chronic respiratory failure.   Bronchoscopy performed on 6/16 showed dynamic airway collapse, tracheal collapsenow vent-dependent, not able to return to SNF   Assessment & Plan:   Principal Problem:   Sepsis ([place]) Active Problems:   COPD (chronic obstructive pulmonary disease) (HCC)   Chronic diastolic CHF (congestive heart failure) (HCC)   Acute on chronic respiratory failure with hypoxia (HCC)   GERD (gastroesophageal reflux disease)   Seizures (HCC)   Type 2 diabetes mellitus without complication, with long-term current use of insulin (HCC)   Severe hypoxic-ischemic encephalopathy   Tracheostomy dependence ([place])   Acute on chronic respiratory failure Probably secondary to dynamic airway collapse.  Currently vent dependent There was a concern for aspiration pneumonia/bacteremia and sepsis on presentation.  Sepsis ruled out.  ID recommends and suggest infection is less likely with reassuring procalcitonin.  She was initially on antibiotics briefly which were discontinued. No distress noted.  Unable to wean her off the vent at this time.  Currently on 40% fio2.      Anoxic brain injury, vegetative state, bedbound status and nonverbal at baseline S/p trach and PEG Neurology suggested patient does not appear to have meaningful recovery No new complaints.   Seizure disorder in the setting of anoxic brain injury Patient currently on clonazepam, Keppra and valproic acid. No new seizures.     Insulin-dependent diabetes mellitus Hemoglobin A1c at 7 Continue sliding scale insulin. CBG (last 3)  Recent Labs    11/15/20 0315  11/15/20 0753 11/15/20 1153  GLUCAP 107* 86 100*       History of CVA Continue with Plavix.  Chronic diastolic heart failure She appears to be euvolemic at this time.   Essential hypertension Blood pressure parameters appear to be borderline low   Body mass index is 35.89 kg/m. Dietary on board.      Anemia of chronic disease:  Hemoglobin stable around 10.    Hypercalcemia:  Monitor.    DVT prophylaxis: (Lovenox. ) Code Status: (Full Code) Family Communication: none at bedside.  Disposition:   Status is: Inpatient  Remains inpatient appropriate because:Unsafe d/c plan  Dispo: The patient is from: SNF              Anticipated d/c is to: SNF              Patient currently is not medically stable to d/c.   Difficult to place patient No       Level of care: ICU Consultants:    PCCM.  Procedures:none.   Antimicrobials: none.    Subjective: No events overnight.  Objective: Vitals:   11/15/20 1300 11/15/20 1400 11/15/20 1500 11/15/20 1506  BP: 121/84 115/80 (!) 134/91 (!) 134/91  Pulse: 85 86 (!) 104 97  Resp: (!) 21 20 (!) 22 20  Temp:      TempSrc:      SpO2: 100% 100% 99% 99%  Weight:      Height:        Intake/Output Summary (Last 24 hours) at 11/15/2020 1548 Last data filed at 11/15/2020 1200 Gross per 24 hour  Intake 1060  ml  Output 1500 ml  Net -440 ml    Filed Weights   11/12/20 0313 11/13/20 0355 11/15/20 0500  Weight: 89.1 kg 89.5 kg 89 kg    Examination:  General exam: Alert , well developed, and ill appearing.  Respiratory system: air entry fair, no wheezing heard. On VENT.  Cardiovascular system: RRR, no murmer, no JVD, no pedal edema.  Gastrointestinal system: Abdomen is soft, non tender non distended, bowel sounds wnl.  Central nervous system: alert, but does not follow commands , is not tracking.  Extremities: no leg edema.  Skin: sacral pressure injury.  Psychiatry: Cannot be assessed.     Data Reviewed:  I have personally reviewed following labs and imaging studies  CBC: Recent Labs  Lab 11/09/20 0211 11/15/20 0811  WBC 7.3 8.3  NEUTROABS 4.1  --   HGB 9.5* 10.0*  HCT 31.3* 32.3*  MCV 88.7 88.3  PLT 300 249     Basic Metabolic Panel: Recent Labs  Lab 11/09/20 0211 11/15/20 0811  NA 141 141  K 3.9 4.2  CL 106 103  CO2 28 31  GLUCOSE 109* 85  BUN 31* 29*  CREATININE 0.49 0.45  CALCIUM 10.6* 10.8*  MG 2.0  --   PHOS 3.0  --      GFR: Estimated Creatinine Clearance: 71.7 mL/min (by C-G formula based on SCr of 0.45 mg/dL).  Liver Function Tests: No results for input(s): AST, ALT, ALKPHOS, BILITOT, PROT, ALBUMIN in the last 168 hours.  CBG: Recent Labs  Lab 11/14/20 2001 11/14/20 2342 11/15/20 0315 11/15/20 0753 11/15/20 1153  GLUCAP 144* 103* 107* 86 100*      Recent Results (from the past 240 hour(s))  MRSA Next Gen by PCR, Nasal     Status: Abnormal   Collection Time: 11/09/20 10:12 AM   Specimen: Nasal Mucosa; Nasal Swab  Result Value Ref Range Status   MRSA by PCR Next Gen DETECTED (A) NOT DETECTED Final    Comment: RESULT CALLED TO, READ BACK BY AND VERIFIED WITH: L BASS RN [id number] 11/09/20 A BROWNING (NOTE) The GeneXpert MRSA Assay (FDA approved for NASAL specimens only), is one component of a comprehensive MRSA colonization surveillance program. It is not intended to diagnose MRSA infection nor to guide or monitor treatment for MRSA infections. Test performance is not FDA approved in patients less than 58 years old. Performed at [facility], [place] [address] [place], [place] [address]           Radiology Studies: No results found.      Scheduled Meds:  arformoterol  15 mcg Nebulization BID   baclofen  5 mg Per Tube TID   budesonide  0.5 mg Nebulization BID   chlorhexidine gluconate (MEDLINE KIT)  15 mL Mouth Rinse BID   Chlorhexidine Gluconate Cloth  6 each Topical Daily   clonazePAM  0.5 mg Per Tube BID   clopidogrel  75  mg Per Tube Daily   enoxaparin (LOVENOX) injection  0.5 mg/kg Subcutaneous Q24H   famotidine  20 mg Per Tube BID   feeding supplement (PROSource TF)  45 mL Per Tube BID   fiber  1 packet Per Tube BID   Gerhardt's butt cream   Topical BID   guaiFENesin  5 mL Per Tube Q6H   hydrocortisone cream   Topical TID   insulin aspart  0-15 Units Subcutaneous Q4H   insulin aspart  2 Units Subcutaneous Q4H   insulin glargine  40 Units Subcutaneous  QHS   levETIRAcetam  2,500 mg Per Tube BID   loratadine  10 mg Per Tube Daily   mouth rinse  15 mL Mouth Rinse 10 times per day   montelukast  10 mg Per Tube Daily   nutrition supplement (JUVEN)  1 packet Per Tube BID   revefenacin  175 mcg Nebulization Daily   tiZANidine  2 mg Per Tube QHS   valproic acid  1,000 mg Per Tube Q12H   Continuous Infusions:  sodium chloride     feeding supplement (JEVITY 1.5 CAL/FIBER) 1,000 mL (11/15/20 0058)     LOS: 45 days        Hosie Poisson, MD [facility]   To contact the attending provider between 7A-7P or the covering provider during after hours 7P-7A, please log into the web site [email or web address] and access using universal [place] password for that web site. If you do not have the password, please call the hospital operator.  11/15/2020, 3:48 PM

## 2020-11-15 NOTE — Progress Notes (Signed)
NAME:  Sonia Drake, MRN:  [id number], DOB:  12/02/1955, LOS: 45 ADMISSION DATE:  10-10-2020, CONSULTATION DATE:  6/15 REFERRING MD:  Thedore Mins, CHIEF COMPLAINT:  Dyspnea   History of Present Illness:  65 y/o female with anoxic brain injury, tracheostomy status who was admitted from a SNF on 6/12 with worsening respiratory failure.  She had previously been ventilator dependent but had been weaned from the trach.  On 6/16 a bronchoscopy revealed dynamic collapse of her trachea, tracheostomy was replaced.  Later required mechanical ventilation.   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 No distress. WBC ct trending down. No events overnight growing GPC clusters in two of two cultures  6/16 increased work of breathing, upper airway noise, abdominal muscle use.  Bronchoscopy performed as trach exchange cuffless #8 Shiley XLT showed dynamic airway collapse, tracheal collapse. 6/16 continued respiratory distress moved to ICU, changed to cuffed #6 Shiley XLT, placed on MV with some improved comfort 6/12 cefepime > 6/14, 6/18 6/12 vanc > 6/14, 6/18  6/23 mucus plugging, severe vent dyssynchrony 6/24-25 intermittent jerking, seizures on EEG 6/27 weaned off propofol gtt  6/28 briefly tolerated trach collar in AM 6/29 tolerated trach collar in AM; no further seizure like activity; working with CM/SW for vent-SNF 7/1 Weaning on CPAP/ PS 10/5, CM SW working on [employer], NO seizure activity, secretions are not an issue per nursing 7/11 trach changed  to #8 Bivona with air cuff, much improved. 7/12 TCT 12 hours; rested on Vent overnight 7/13 on TCT 7/23 Trach collar tolerated 7/24 Full vent support 7/26>> Remains on full vent support this am, no issues with secretions per nursing   Interim History / Subjective:  Has not been able to tolerate trach collar trial since 7/24. Failed PS due to apnea  Objective   Blood pressure (!) 134/90, pulse 100, temperature 97.7 F (36.5 C), temperature source Oral, resp. rate (!) 27, height 5\' 2"  (1.575 m), weight 91.4 kg, SpO2 96 %.    Vent Mode: PRVC FiO2 (%):  [40 %-98 %] 40 % Set Rate:  [20 bmp] 20 bmp Vt Set:  [400 mL] 400 mL PEEP:  [5 cmH20] 5 cmH20 Plateau Pressure:  [14 cmH20-17 cmH20] 14 cmH20   Intake/Output Summary (Last 24 hours) at 11/15/2020 1846 Last data filed at 11/15/2020 1800 Gross per 24 hour  Intake 1330 ml  Output 1300 ml  Net 30 ml   Filed Weights   11/12/20 0313 11/13/20 0355 11/15/20 0500  Weight: 89.1 kg 89.5 kg 89 kg   Physical Exam: General: Chronically ill-appearing, no acute distress HENT: [place], AT, OP clear, MMM Neck: Trach in place, c/d/i Eyes: EOMI, no scleral icterus Respiratory: Bilateral rhonchi, no wheezing Cardiovascular: RRR, -M/R/G, no JVD GI: BS+, soft, nontender, PEG in place Neuro: Minimally interactive, no tracking, does not follow commands, does not withdraw  Studies:  VQ scan 6/15> Neg for acute PE CT Head wo Contrast 6/24 > No definite acute intracranial abnormality, progressive cerebral atrophy w/chronic small vessel ischemic disease, acute L sphenoid sinusitis MRI Brain wo Contrast 6/28>  advanced atrophy, old Lt occipital infarct  Resolved  Hospital Problem list   Circulatory shock Hemoptysis  Assessment & Plan:   Acute on chronic hypoxic/hypercapnic respiratory failure in setting of anoxic encephalopathy, tracheobronchomalacia, and severe COPD. S/P biovana #8, legnth 140 on 7/11. Still requiring MV at night.  Still requiring MV at  night. Failed ATC this am due to RR of 7 and no gag.  Previously able to tolerate TCT however >72 hours on full support P: -PS as tolerated with TCT when able -Full vent support at night -Mechanical venilator on standby for respiratory distress. If requiring MV overnight, try on CPAP/PS.  -Goal SAT 88-96% -Continue brovana, pulmicort, yupelri, singulair -Continue PRN albuterol -Continue VAP prevention -Continue trach care, tracheal suction as needed.  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 Seizure disorder. Hypertension. DM type 2. Hx of CVA. Obesity  P: -Per primary team   Labs:   CMP Latest Ref Rng & Units 11/15/2020 11/09/2020 11/07/2020  Glucose 70 - 99 mg/dL 85 431(V) 400(Q)  BUN 8 - 23 mg/dL 67(Y) 19(J) 09(T)  Creatinine 0.44 - 1.00 mg/dL 2.67 1.24 5.80  Sodium 135 - 145 mmol/L 141 141 139  Potassium 3.5 - 5.1 mmol/L 4.2 3.9 4.4  Chloride 98 - 111 mmol/L 103 106 104  CO2 22 - 32 mmol/L 31 28 27   Calcium 8.9 - 10.3 mg/dL 10.8(H) 10.6(H) 10.8(H)  Total Protein 6.5 - 8.1 g/dL - - -  Total Bilirubin 0.3 - 1.2 mg/dL - - -  Alkaline Phos 38 - 126 U/L - - -  AST 15 - 41 U/L - - -  ALT 0 - 44 U/L - - -    CBC Latest Ref Rng & Units 11/15/2020 11/09/2020 11/07/2020  WBC 4.0 - 10.5 K/uL 8.3 7.3 9.7  Hemoglobin 12.0 - 15.0 g/dL 10.0(L) 9.5(L) 10.0(L)  Hematocrit 36.0 - 46.0 % 32.3(L) 31.3(L) 33.1(L)  Platelets 150 - 400 K/uL 249 300 360    ABG    Component Value Date/Time   PHART 7.388 10/27/2020 0356   PCO2ART 50.4 (H) 10/27/2020 0356   PO2ART 127 (H) 10/27/2020 0356   HCO3 30.5 (H) 10/27/2020 0356   TCO2 32 10/27/2020 0356   O2SAT 99.0 10/27/2020 0356    CBG (last 3)  Recent Labs    11/15/20 0753 11/15/20 1153 11/15/20 1559  GLUCAP 86 100* 98    Signature:   The patient is critically ill with multiple organ systems failure and requires high complexity decision making for assessment and support, frequent evaluation and  titration of therapies, application of advanced monitoring technologies and extensive interpretation of multiple databases.  Independent Critical Care Time: 32 Minutes.   11/17/20, M.D. [facility] 11/15/2020 6:46 PM   Please see Amion for pager number to reach on-call Pulmonary and Critical Care Team.

## 2020-11-16 DIAGNOSIS — J9621 Acute and chronic respiratory failure with hypoxia: Secondary | ICD-10-CM | POA: Diagnosis not present

## 2020-11-16 LAB — GLUCOSE, CAPILLARY
Glucose-Capillary: 102 mg/dL — ABNORMAL HIGH (ref 70–99)
Glucose-Capillary: 119 mg/dL — ABNORMAL HIGH (ref 70–99)
Glucose-Capillary: 125 mg/dL — ABNORMAL HIGH (ref 70–99)
Glucose-Capillary: 167 mg/dL — ABNORMAL HIGH (ref 70–99)
Glucose-Capillary: 170 mg/dL — ABNORMAL HIGH (ref 70–99)
Glucose-Capillary: 96 mg/dL (ref 70–99)

## 2020-11-16 MED ORDER — INSULIN GLARGINE 100 UNIT/ML ~~LOC~~ SOLN
36.0000 [IU] | Freq: Every day | SUBCUTANEOUS | Status: DC
  Administered 2020-11-16 – 2020-11-26 (×11): 36 [IU] via SUBCUTANEOUS
  Filled 2020-11-16 (×12): qty 0.36

## 2020-11-16 NOTE — Progress Notes (Signed)
PROGRESS NOTE    Sonia Drake  UJW:[id number] DOB: [birth year] DOA: 09/25/2020 PCP: Garwin Brothers, MD    Chief Complaint  Patient presents with   Respiratory Distress    Brief Narrative:   65 y.o from SNF , Hx of DM2, CHF, h/o cardiac arrest in 11/2019 result in severe anoxic brain injury, s/p PEG tube, s/p chronic trach presents with acute on chronic respiratory failure.   Bronchoscopy performed on 6/16 showed dynamic airway collapse, tracheal collapsenow vent-dependent, not able to return to SNF   Assessment & Plan:   Principal Problem:   Sepsis ([place]) Active Problems:   COPD (chronic obstructive pulmonary disease) (HCC)   Chronic diastolic CHF (congestive heart failure) (HCC)   Acute on chronic respiratory failure with hypoxia (HCC)   GERD (gastroesophageal reflux disease)   Seizures (HCC)   Type 2 diabetes mellitus without complication, with long-term current use of insulin (HCC)   Severe hypoxic-ischemic encephalopathy   Tracheostomy dependence ([place])   Acute on chronic respiratory failure Probably secondary to dynamic airway collapse.  Currently vent dependent There was a concern for aspiration pneumonia/bacteremia and sepsis on presentation.  Sepsis ruled out.  ID recommends and suggest infection is less likely with reassuring procalcitonin.  She was initially on antibiotics briefly which were discontinued. No distress noted. Bronchodilators as needed.  Unable to wean her off the vent at this time. Pulmonary toilet.  Currently on 40% fio2.    Anoxic brain injury, vegetative state, bedbound status and nonverbal at baseline S/p trach and PEG Neurology suggested patient does not appear to have meaningful recovery No new complaints.   Seizure disorder in the setting of anoxic brain injury Patient currently on clonazepam, Keppra and valproic acid. No new seizures.     Insulin-dependent diabetes mellitus Hemoglobin A1c at 7 Continue sliding scale insulin. CBG (last  3)  Recent Labs    11/15/20 2321 11/16/20 0320 11/16/20 0740  GLUCAP 128* 125* 170*        History of CVA Continue with Plavix.  Chronic diastolic heart failure She appears to be euvolemic at this time.   Essential hypertension Blood pressure parameters appear to be borderline low   Body mass index is 35.89 kg/m. Dietary on board.      Anemia of chronic disease:  Hemoglobin stable around 10.    Hypercalcemia:  Monitor.    DVT prophylaxis: (Lovenox. ) Code Status: (Full Code) Family Communication: none at bedside.  Disposition:   Status is: Inpatient  Remains inpatient appropriate because:Unsafe d/c plan  Dispo: The patient is from: SNF              Anticipated d/c is to: SNF              Patient currently is not medically stable to d/c.   Difficult to place patient No       Level of care: ICU Consultants:  PCCM.  Procedures:none.   Antimicrobials: none.    Subjective: No events overnight.  Objective: Vitals:   11/16/20 0404 11/16/20 0500 11/16/20 0600 11/16/20 0752  BP:  93/67 114/87 116/83  Pulse:  75 80 93  Resp:  20 15 20   Temp:    97.9 F (36.6 C)  TempSrc:    Axillary  SpO2: 100% 99% 99% 100%  Weight:      Height:        Intake/Output Summary (Last 24 hours) at 11/16/2020 0931 Last data filed at 11/16/2020 0636 Gross per 24 hour  Intake 405  ml  Output 700 ml  Net -295 ml    Filed Weights   11/12/20 0313 11/13/20 0355 11/15/20 0500  Weight: 89.1 kg 89.5 kg 89 kg    Examination:  General exam: ill appearing lady not in distress.  Respiratory system: air entry fair, on vent support.  Cardiovascular system: RRR, no JVD, no pedal edema.  Gastrointestinal system: Abdomen is soft, bs+ Central nervous system: not following commands.  Extremities: no leg edema.  Skin: sacral pressure injury.  Psychiatry: Cannot be assessed.     Data Reviewed: I have personally reviewed following labs and imaging  studies  CBC: Recent Labs  Lab 11/15/20 0811  WBC 8.3  HGB 10.0*  HCT 32.3*  MCV 88.3  PLT 249     Basic Metabolic Panel: Recent Labs  Lab 11/15/20 0811  NA 141  K 4.2  CL 103  CO2 31  GLUCOSE 85  BUN 29*  CREATININE 0.45  CALCIUM 10.8*     GFR: Estimated Creatinine Clearance: 71.7 mL/min (by C-G formula based on SCr of 0.45 mg/dL).  Liver Function Tests: No results for input(s): AST, ALT, ALKPHOS, BILITOT, PROT, ALBUMIN in the last 168 hours.  CBG: Recent Labs  Lab 11/15/20 1559 11/15/20 1940 11/15/20 2321 11/16/20 0320 11/16/20 0740  GLUCAP 98 122* 128* 125* 170*      Recent Results (from the past 240 hour(s))  MRSA Next Gen by PCR, Nasal     Status: Abnormal   Collection Time: 11/09/20 10:12 AM   Specimen: Nasal Mucosa; Nasal Swab  Result Value Ref Range Status   MRSA by PCR Next Gen DETECTED (A) NOT DETECTED Final    Comment: RESULT CALLED TO, READ BACK BY AND VERIFIED WITH: L BASS RN [id number] 11/09/20 A BROWNING (NOTE) The GeneXpert MRSA Assay (FDA approved for NASAL specimens only), is one component of a comprehensive MRSA colonization surveillance program. It is not intended to diagnose MRSA infection nor to guide or monitor treatment for MRSA infections. Test performance is not FDA approved in patients less than 65 years old. Performed at [facility], [place] [address] [place], [place] [address]           Radiology Studies: No results found.      Scheduled Meds:  arformoterol  15 mcg Nebulization BID   baclofen  5 mg Per Tube TID   budesonide  0.5 mg Nebulization BID   chlorhexidine gluconate (MEDLINE KIT)  15 mL Mouth Rinse BID   Chlorhexidine Gluconate Cloth  6 each Topical Daily   clonazePAM  0.5 mg Per Tube BID   clopidogrel  75 mg Per Tube Daily   enoxaparin (LOVENOX) injection  0.5 mg/kg Subcutaneous Q24H   famotidine  20 mg Per Tube BID   feeding supplement (PROSource TF)  45 mL Per Tube BID   fiber  1 packet Per  Tube BID   Gerhardt's butt cream   Topical BID   guaiFENesin  5 mL Per Tube Q6H   hydrocortisone cream   Topical TID   insulin aspart  0-15 Units Subcutaneous Q4H   insulin aspart  2 Units Subcutaneous Q4H   insulin glargine  40 Units Subcutaneous QHS   levETIRAcetam  2,500 mg Per Tube BID   loratadine  10 mg Per Tube Daily   mouth rinse  15 mL Mouth Rinse 10 times per day   montelukast  10 mg Per Tube Daily   nutrition supplement (JUVEN)  1 packet Per Tube BID  revefenacin  175 mcg Nebulization Daily   tiZANidine  2 mg Per Tube QHS   valproic acid  1,000 mg Per Tube Q12H   Continuous Infusions:  sodium chloride     feeding supplement (JEVITY 1.5 CAL/FIBER) 1,000 mL (11/15/20 0058)     LOS: 47 days        Hosie Poisson, MD [facility]   To contact the attending provider between 7A-7P or the covering provider during after hours 7P-7A, please log into the web site [email or web address] and access using universal [place] password for that web site. If you do not have the password, please call the hospital operator.  11/16/2020, 9:31 AM

## 2020-11-17 DIAGNOSIS — J9621 Acute and chronic respiratory failure with hypoxia: Secondary | ICD-10-CM | POA: Diagnosis not present

## 2020-11-17 LAB — GLUCOSE, CAPILLARY
Glucose-Capillary: 111 mg/dL — ABNORMAL HIGH (ref 70–99)
Glucose-Capillary: 150 mg/dL — ABNORMAL HIGH (ref 70–99)
Glucose-Capillary: 113 mg/dL — ABNORMAL HIGH (ref 70–99)
Glucose-Capillary: 127 mg/dL — ABNORMAL HIGH (ref 70–99)
Glucose-Capillary: 119 mg/dL — ABNORMAL HIGH (ref 70–99)
Glucose-Capillary: 162 mg/dL — ABNORMAL HIGH (ref 70–99)

## 2020-11-17 NOTE — Progress Notes (Signed)
PROGRESS NOTE    Sonia ISAACS  [id number]:[id number] DOB: [birth year] DOA: 10/13/2020 PCP: Garwin Brothers, MD    Chief Complaint  Patient presents with   Respiratory Distress    Brief Narrative:   65 y.o from SNF , Hx of DM2, CHF, h/o cardiac arrest in 11/2019 result in severe anoxic brain injury, s/p PEG tube, s/p chronic trach presents with acute on chronic respiratory failure.   Bronchoscopy performed on 6/16 showed dynamic airway collapse, tracheal collapsenow vent-dependent, not able to return to SNF   Assessment & Plan:   Principal Problem:   Sepsis ([place]) Active Problems:   COPD (chronic obstructive pulmonary disease) (HCC)   Chronic diastolic CHF (congestive heart failure) (HCC)   Acute on chronic respiratory failure with hypoxia (HCC)   GERD (gastroesophageal reflux disease)   Seizures (HCC)   Type 2 diabetes mellitus without complication, with long-term current use of insulin (HCC)   Severe hypoxic-ischemic encephalopathy   Tracheostomy dependence ([place])   Acute on chronic respiratory failure Probably secondary to dynamic airway collapse.  Currently vent dependent There was a concern for aspiration pneumonia/bacteremia and sepsis on presentation.  Sepsis ruled out.  ID recommends and suggest infection is less likely with reassuring procalcitonin.  She was initially on antibiotics briefly which were discontinued. No distress noted. Bronchodilators as needed.  Vent during the night and failed ATC.   Anoxic brain injury, vegetative state, bedbound status and nonverbal at baseline S/p trach and PEG Neurology suggested patient does not appear to have meaningful recovery No new complaints   Seizure disorder in the setting of anoxic brain injury Patient currently on clonazepam, Keppra and valproic acid. No new seizures.     Insulin-dependent diabetes mellitus Hemoglobin A1c at 7 Continue sliding scale insulin. CBG (last 3)  Recent Labs    11/17/20 0344 11/17/20 0733  11/17/20 1119  GLUCAP 111* 150* 113*   No changes in medications.  Hypertension  blood pressure appears to be borderline low, continue to monitor   History of CVA Continue with Plavix.  Chronic diastolic heart failure She appears to be euvolemic at this time.   Essential hypertension Blood pressure is borderline low   Body mass index is 35.89 kg/m. Dietary on board.      Anemia of chronic disease:  Hemoglobin stable around 10.    Hypercalcemia:  Monitor.    DVT prophylaxis: (Lovenox. ) Code Status: (Full Code) Family Communication: none at bedside.  Disposition:   Status is: Inpatient  Remains inpatient appropriate because:Unsafe d/c plan  Dispo: The patient is from: SNF              Anticipated d/c is to: SNF              Patient currently is not medically stable to d/c.   Difficult to place patient No       Level of care: ICU Consultants:  PCCM.  Procedures:none.   Antimicrobials: none.    Subjective: No events overnight  Objective: Vitals:   11/17/20 1100 11/17/20 1121 11/17/20 1200 11/17/20 1211  BP: 93/81   (!) 144/86  Pulse: 84  79 87  Resp: 18  17 19   Temp:  98.8 F (37.1 C)    TempSrc:  Axillary    SpO2: 100%  99% 100%  Weight:      Height:        Intake/Output Summary (Last 24 hours) at 11/17/2020 1308 Last data filed at 11/17/2020 0607 Gross per 24 hour  Intake  270 ml  Output 900 ml  Net -630 ml    Filed Weights   11/12/20 0313 11/13/20 0355 11/15/20 0500  Weight: 89.1 kg 89.5 kg 89 kg    Examination:  General exam: Chronically ill-appearing lady, on mechanical ventilation and stable Respiratory system: Air entry fair bilateral, no wheezing or rhonchi on vent support Cardiovascular system: Regular rate rhythm, no JVD or pedal edema Gastrointestinal system: Abdomen is soft nondistended bowel sounds normal Central nervous system: Alert, not following commands or tracking Extremities: No pedal edema Skin: sacral  pressure injury.  Psychiatry: Cannot be assessed    Data Reviewed: I have personally reviewed following labs and imaging studies  CBC: Recent Labs  Lab 11/15/20 0811  WBC 8.3  HGB 10.0*  HCT 32.3*  MCV 88.3  PLT 249     Basic Metabolic Panel: Recent Labs  Lab 11/15/20 0811  NA 141  K 4.2  CL 103  CO2 31  GLUCOSE 85  BUN 29*  CREATININE 0.45  CALCIUM 10.8*     GFR: Estimated Creatinine Clearance: 71.7 mL/min (by C-G formula based on SCr of 0.45 mg/dL).  Liver Function Tests: No results for input(s): AST, ALT, ALKPHOS, BILITOT, PROT, ALBUMIN in the last 168 hours.  CBG: Recent Labs  Lab 11/16/20 1935 11/16/20 2331 11/17/20 0344 11/17/20 0733 11/17/20 1119  GLUCAP 119* 167* 111* 150* 113*      Recent Results (from the past 240 hour(s))  MRSA Next Gen by PCR, Nasal     Status: Abnormal   Collection Time: 11/09/20 10:12 AM   Specimen: Nasal Mucosa; Nasal Swab  Result Value Ref Range Status   MRSA by PCR Next Gen DETECTED (A) NOT DETECTED Final    Comment: RESULT CALLED TO, READ BACK BY AND VERIFIED WITH: L BASS RN [id number] 11/09/20 A BROWNING (NOTE) The GeneXpert MRSA Assay (FDA approved for NASAL specimens only), is one component of a comprehensive MRSA colonization surveillance program. It is not intended to diagnose MRSA infection nor to guide or monitor treatment for MRSA infections. Test performance is not FDA approved in patients less than 83 years old. Performed at [facility], [place] [address], [place], [place] [address]           Radiology Studies: No results found.      Scheduled Meds:  arformoterol  15 mcg Nebulization BID   baclofen  5 mg Per Tube TID   budesonide  0.5 mg Nebulization BID   chlorhexidine gluconate (MEDLINE KIT)  15 mL Mouth Rinse BID   Chlorhexidine Gluconate Cloth  6 each Topical Daily   clonazePAM  0.5 mg Per Tube BID   clopidogrel  75 mg Per Tube Daily   enoxaparin (LOVENOX) injection  0.5 mg/kg  Subcutaneous Q24H   famotidine  20 mg Per Tube BID   feeding supplement (PROSource TF)  45 mL Per Tube BID   fiber  1 packet Per Tube BID   Gerhardt's butt cream   Topical BID   guaiFENesin  5 mL Per Tube Q6H   hydrocortisone cream   Topical TID   insulin aspart  0-15 Units Subcutaneous Q4H   insulin glargine  36 Units Subcutaneous QHS   levETIRAcetam  2,500 mg Per Tube BID   loratadine  10 mg Per Tube Daily   mouth rinse  15 mL Mouth Rinse 10 times per day   montelukast  10 mg Per Tube Daily   nutrition supplement (JUVEN)  1 packet Per Tube BID  revefenacin  175 mcg Nebulization Daily   tiZANidine  2 mg Per Tube QHS   valproic acid  1,000 mg Per Tube Q12H   Continuous Infusions:  sodium chloride     feeding supplement (JEVITY 1.5 CAL/FIBER) 1,000 mL (11/15/20 0058)     LOS: 86 days        Hosie Poisson, MD [facility]   To contact the attending provider between 7A-7P or the covering provider during after hours 7P-7A, please log into the web site [email or web address] and access using universal [place] password for that web site. If you do not have the password, please call the hospital operator.  11/17/2020, 1:08 PM

## 2020-11-17 NOTE — Plan of Care (Signed)
  Problem: Clinical Measurements: Goal: Ability to maintain clinical measurements within normal limits will improve Outcome: Progressing Goal: Will remain free from infection Outcome: Progressing Goal: Diagnostic test results will improve Outcome: Progressing Goal: Respiratory complications will improve Outcome: Progressing Goal: Cardiovascular complication will be avoided Outcome: Progressing   Problem: Activity: Goal: Risk for activity intolerance will decrease Outcome: Progressing   Problem: Nutrition: Goal: Adequate nutrition will be maintained Outcome: Progressing   Problem: Elimination: Goal: Will not experience complications related to bowel motility Outcome: Progressing Goal: Will not experience complications related to urinary retention Outcome: Progressing   Problem: Pain Managment: Goal: General experience of comfort will improve Outcome: Progressing   Problem: Safety: Goal: Ability to remain free from injury will improve Outcome: Progressing   Problem: Skin Integrity: Goal: Risk for impaired skin integrity will decrease Outcome: Progressing   Problem: Activity: Goal: Ability to tolerate increased activity will improve Outcome: Progressing   Problem: Activity: Goal: Ability to tolerate increased activity will improve Outcome: Progressing

## 2020-11-18 DIAGNOSIS — J9621 Acute and chronic respiratory failure with hypoxia: Secondary | ICD-10-CM | POA: Diagnosis not present

## 2020-11-18 LAB — GLUCOSE, CAPILLARY
Glucose-Capillary: 102 mg/dL — ABNORMAL HIGH (ref 70–99)
Glucose-Capillary: 115 mg/dL — ABNORMAL HIGH (ref 70–99)
Glucose-Capillary: 118 mg/dL — ABNORMAL HIGH (ref 70–99)
Glucose-Capillary: 120 mg/dL — ABNORMAL HIGH (ref 70–99)
Glucose-Capillary: 121 mg/dL — ABNORMAL HIGH (ref 70–99)
Glucose-Capillary: 122 mg/dL — ABNORMAL HIGH (ref 70–99)

## 2020-11-18 NOTE — Plan of Care (Signed)

## 2020-11-18 NOTE — Progress Notes (Signed)
PROGRESS NOTE    Sonia Drake  LTJ:[id number] DOB: 10-17-1955 DOA: 09/20/2020 PCP: Garwin Brothers, MD    Chief Complaint  Patient presents with   Respiratory Distress    Brief Narrative:   65 y.o from SNF , Hx of DM2, CHF, h/o cardiac arrest in 11/2019 result in severe anoxic brain injury, s/p PEG tube, s/p chronic trach presents with acute on chronic respiratory failure.   Bronchoscopy performed on 6/16 showed dynamic airway collapse, tracheal collapsenow vent-dependent, not able to return to SNF. Pt seen and examined at bedside, no new complaints.    Assessment & Plan:   Principal Problem:   Sepsis ([place]) Active Problems:   COPD (chronic obstructive pulmonary disease) (HCC)   Chronic diastolic CHF (congestive heart failure) (HCC)   Acute on chronic respiratory failure with hypoxia (HCC)   GERD (gastroesophageal reflux disease)   Seizures (HCC)   Type 2 diabetes mellitus without complication, with long-term current use of insulin (HCC)   Severe hypoxic-ischemic encephalopathy   Tracheostomy dependence ([place])   Acute on chronic respiratory failure Probably secondary to dynamic airway collapse.  Currently vent dependent There was a concern for aspiration pneumonia/bacteremia and sepsis on presentation.  Sepsis ruled out.  ID recommends and suggest infection is less likely with reassuring procalcitonin.  She was initially on antibiotics briefly which were discontinued. No distress noted. Bronchodilators as needed.  Vent during the night and failed ATC.   Anoxic brain injury, vegetative state, bedbound status and nonverbal at baseline S/p trach and PEG Neurology suggested patient does not appear to have meaningful recovery. No changes in her mental status.    Seizure disorder in the setting of anoxic brain injury Patient currently on clonazepam, Keppra and valproic acid. No seizure activity.     Insulin-dependent diabetes mellitus Hemoglobin A1c at 7 Continue sliding  scale insulin. CBG (last 3)  Recent Labs    11/17/20 2321 11/18/20 0331 11/18/20 0727  GLUCAP 162* 120* 118*   No changes in meds.  Cbgs are well controlled.   Hypertension  Well controlled BP parameters.    History of CVA Continue with Plavix.  Chronic diastolic heart failure She appears to be euvolemic at this time.      Body mass index is 35.89 kg/m. Dietary on board.      Anemia of chronic disease:  Hemoglobin stable around 10.    Hypercalcemia:  Monitor.    DVT prophylaxis: (Lovenox. ) Code Status: (Full Code) Family Communication: none at bedside.  Disposition:   Status is: Inpatient  Remains inpatient appropriate because:Unsafe d/c plan  Dispo: The patient is from: SNF              Anticipated d/c is to: SNF              Patient currently is not medically stable to d/c.   Difficult to place patient No       Level of care: ICU Consultants:  PCCM.  Procedures:none.   Antimicrobials: none.    Subjective: No events overnight.   Objective: Vitals:   11/18/20 0600 11/18/20 0728 11/18/20 0823 11/18/20 0824  BP: (!) 146/95     Pulse: 83     Resp: 15     Temp:  98.1 F (36.7 C)    TempSrc:  Axillary    SpO2: 98%  100% 100%  Weight:      Height:        Intake/Output Summary (Last 24 hours) at 11/18/2020 1021 Last data  filed at 11/18/2020 0600 Gross per 24 hour  Intake 360 ml  Output 900 ml  Net -540 ml    Filed Weights   11/12/20 0313 11/13/20 0355 11/15/20 0500  Weight: 89.1 kg 89.5 kg 89 kg    Examination:  General exam: Chronically ill-appearing lady on mechanical ventilation, does not appear to be in distress Respiratory system: Air entry fair bilateral no wheezing or rhonchi on vent support Cardiovascular system: Rate regular rate rhythm, no JVD no pedal edema Gastrointestinal system: Abdomen is soft, nondistended bowel sounds normal Central nervous system: Sleeping comfortably, opens eyes with  tactile/verbal Extremities: No pedal edema Skin: sacral pressure injury.  Psychiatry: Cannot be assessed    Data Reviewed: I have personally reviewed following labs and imaging studies  CBC: Recent Labs  Lab 11/15/20 0811  WBC 8.3  HGB 10.0*  HCT 32.3*  MCV 88.3  PLT 249     Basic Metabolic Panel: Recent Labs  Lab 11/15/20 0811  NA 141  K 4.2  CL 103  CO2 31  GLUCOSE 85  BUN 29*  CREATININE 0.45  CALCIUM 10.8*     GFR: Estimated Creatinine Clearance: 71.7 mL/min (by C-G formula based on SCr of 0.45 mg/dL).  Liver Function Tests: No results for input(s): AST, ALT, ALKPHOS, BILITOT, PROT, ALBUMIN in the last 168 hours.  CBG: Recent Labs  Lab 11/17/20 1529 11/17/20 1924 11/17/20 2321 11/18/20 0331 11/18/20 0727  GLUCAP 127* 119* 162* 120* 118*      Recent Results (from the past 240 hour(s))  MRSA Next Gen by PCR, Nasal     Status: Abnormal   Collection Time: 11/09/20 10:12 AM   Specimen: Nasal Mucosa; Nasal Swab  Result Value Ref Range Status   MRSA by PCR Next Gen DETECTED (A) NOT DETECTED Final    Comment: RESULT CALLED TO, READ BACK BY AND VERIFIED WITH: L BASS RN [id number] 11/09/20 A BROWNING (NOTE) The GeneXpert MRSA Assay (FDA approved for NASAL specimens only), is one component of a comprehensive MRSA colonization surveillance program. It is not intended to diagnose MRSA infection nor to guide or monitor treatment for MRSA infections. Test performance is not FDA approved in patients less than 67 years old. Performed at [facility], [place] [address], [place], [place] [address]           Radiology Studies: No results found.      Scheduled Meds:  arformoterol  15 mcg Nebulization BID   baclofen  5 mg Per Tube TID   budesonide  0.5 mg Nebulization BID   chlorhexidine gluconate (MEDLINE KIT)  15 mL Mouth Rinse BID   Chlorhexidine Gluconate Cloth  6 each Topical Daily   clonazePAM  0.5 mg Per Tube BID   clopidogrel  75 mg Per  Tube Daily   enoxaparin (LOVENOX) injection  0.5 mg/kg Subcutaneous Q24H   famotidine  20 mg Per Tube BID   feeding supplement (PROSource TF)  45 mL Per Tube BID   fiber  1 packet Per Tube BID   Gerhardt's butt cream   Topical BID   guaiFENesin  5 mL Per Tube Q6H   hydrocortisone cream   Topical TID   insulin aspart  0-15 Units Subcutaneous Q4H   insulin glargine  36 Units Subcutaneous QHS   levETIRAcetam  2,500 mg Per Tube BID   loratadine  10 mg Per Tube Daily   mouth rinse  15 mL Mouth Rinse 10 times per day   montelukast  10  mg Per Tube Daily   nutrition supplement (JUVEN)  1 packet Per Tube BID   revefenacin  175 mcg Nebulization Daily   tiZANidine  2 mg Per Tube QHS   valproic acid  1,000 mg Per Tube Q12H   Continuous Infusions:  sodium chloride     feeding supplement (JEVITY 1.5 CAL/FIBER) 1,000 mL (11/15/20 0058)     LOS: 48 days        Hosie Poisson, MD [facility]   To contact the attending provider between 7A-7P or the covering provider during after hours 7P-7A, please log into the web site [email or web address] and access using universal [place] password for that web site. If you do not have the password, please call the hospital operator.  11/18/2020, 10:21 AM

## 2020-11-19 ENCOUNTER — Inpatient Hospital Stay (HOSPITAL_COMMUNITY): Payer: Medicare Other

## 2020-11-19 DIAGNOSIS — J9621 Acute and chronic respiratory failure with hypoxia: Secondary | ICD-10-CM | POA: Diagnosis not present

## 2020-11-19 LAB — BASIC METABOLIC PANEL
Anion gap: 7 (ref 5–15)
BUN: 27 mg/dL — ABNORMAL HIGH (ref 8–23)
CO2: 28 mmol/L (ref 22–32)
Calcium: 10.4 mg/dL — ABNORMAL HIGH (ref 8.9–10.3)
Chloride: 107 mmol/L (ref 98–111)
Creatinine, Ser: 0.47 mg/dL (ref 0.44–1.00)
GFR, Estimated: >60 mL/min (ref >60)
Glucose, Bld: 135 mg/dL — ABNORMAL HIGH (ref 70–99)
Potassium: 4.1 mmol/L (ref 3.5–5.1)
Sodium: 142 mmol/L (ref 135–145)

## 2020-11-19 LAB — PROTIME-INR
INR: 1.1 (ref 0.8–1.2)
Prothrombin Time: 14.4 seconds (ref 11.4–15.2)

## 2020-11-19 LAB — CBC WITH DIFFERENTIAL/PLATELET
Abs Immature Granulocytes: 0.04 10*3/uL (ref 0.00–0.07)
Basophils Absolute: 0 10*3/uL (ref 0.0–0.1)
Basophils Relative: 1 %
Eosinophils Absolute: 0.4 10*3/uL (ref 0.0–0.5)
Eosinophils Relative: 5 %
HCT: 31.9 % — ABNORMAL LOW (ref 36.0–46.0)
Hemoglobin: 9.8 g/dL — ABNORMAL LOW (ref 12.0–15.0)
Immature Granulocytes: 1 %
Lymphocytes Relative: 25 %
Lymphs Abs: 1.9 10*3/uL (ref 0.7–4.0)
MCH: 27.1 pg (ref 26.0–34.0)
MCHC: 30.7 g/dL (ref 30.0–36.0)
MCV: 88.4 fL (ref 80.0–100.0)
Monocytes Absolute: 0.7 10*3/uL (ref 0.1–1.0)
Monocytes Relative: 9 %
Neutro Abs: 4.7 10*3/uL (ref 1.7–7.7)
Neutrophils Relative %: 59 %
Platelets: 212 10*3/uL (ref 150–400)
RBC: 3.61 MIL/uL — ABNORMAL LOW (ref 3.87–5.11)
RDW: 17.2 % — ABNORMAL HIGH (ref 11.5–15.5)
WBC: 7.9 10*3/uL (ref 4.0–10.5)
nRBC: 0 % (ref 0.0–0.2)

## 2020-11-19 LAB — GLUCOSE, CAPILLARY
Glucose-Capillary: 103 mg/dL — ABNORMAL HIGH (ref 70–99)
Glucose-Capillary: 107 mg/dL — ABNORMAL HIGH (ref 70–99)
Glucose-Capillary: 133 mg/dL — ABNORMAL HIGH (ref 70–99)
Glucose-Capillary: 144 mg/dL — ABNORMAL HIGH (ref 70–99)
Glucose-Capillary: 184 mg/dL — ABNORMAL HIGH (ref 70–99)
Glucose-Capillary: 99 mg/dL (ref 70–99)

## 2020-11-19 NOTE — Progress Notes (Addendum)
eLink Physician-Brief Progress Note Patient Name: Sonia Drake DOB: 18-Aug-1955 MRN: [id number]   Date of Service  11/19/2020  HPI/Events of Note  Notified of some bloody tracheal secretions with elevated peak pressure on vent. RN had given 25 microgram fentanyl. Seen on camera. Has some tachypnea but not in overt distress. Noted secretions are bloody.   eICU Interventions  Repeat a fentanyl dose CBC, bmp, INR now CXR Monitor secretions - is on Plavix and Lovenox which may need to be held if this continues Let us know when labs and CXR result      Intervention Category Major Interventions: Respiratory failure - evaluation and management  Majestic Molony G Meyer Arora 11/19/2020, 12:41 AM  Also O2 sat remains 98 on 40% fio2, no changes noted

## 2020-11-19 NOTE — Progress Notes (Signed)
PROGRESS NOTE    Sonia Drake  HFG:[id number] DOB: 26-Dec-1955 DOA: 10/16/2020 PCP: Garwin Brothers, MD    Chief Complaint  Patient presents with   Respiratory Distress    Brief Narrative:   65 y.o from SNF , Hx of DM2, CHF, h/o cardiac arrest in 11/2019 result in severe anoxic brain injury, s/p PEG tube, s/p chronic trach presents with acute on chronic respiratory failure.   Bronchoscopy performed on 6/16 showed dynamic airway collapse, tracheal collapsenow vent-dependent, not able to return to SNF. Overnight patient had some bloody tracheal secretions with elevated peak pressure on vent.  She was given 25 mcg of fentanyl.  Chest x-ray shows hyperinflation of the retaining cuff of the tracheostomy, expanding the airway.    Assessment & Plan:   Principal Problem:   Sepsis ([place]) Active Problems:   COPD (chronic obstructive pulmonary disease) (HCC)   Chronic diastolic CHF (congestive heart failure) (HCC)   Acute on chronic respiratory failure with hypoxia (HCC)   GERD (gastroesophageal reflux disease)   Seizures (HCC)   Type 2 diabetes mellitus without complication, with long-term current use of insulin (HCC)   Severe hypoxic-ischemic encephalopathy   Tracheostomy dependence ([place])   Acute on chronic respiratory failure Probably secondary to dynamic airway collapse.  Currently vent dependent There was a concern for aspiration pneumonia/bacteremia and sepsis on presentation.  Sepsis ruled out.  ID recommends and suggest infection is less likely with reassuring procalcitonin.  She was initially on antibiotics briefly which were discontinued. No distress noted. Bronchodilators as needed.  Vent during the night and failed ATC. Was able to only tolerate pressure support for a couple of hours earlier this morning   Anoxic brain injury, vegetative state, bedbound status and nonverbal at baseline S/p trach and PEG Neurology suggested patient does not appear to have meaningful recovery.   No change in her mental status  Seizure disorder in the setting of anoxic brain injury Patient currently on clonazepam, Keppra and valproic acid. No seizure activity    Insulin-dependent diabetes mellitus Hemoglobin A1c at 7 Continue sliding scale insulin. CBG (last 3)  Recent Labs    11/19/20 0332 11/19/20 0712 11/19/20 1130  GLUCAP 144* 107* 103*   No changes in medications blood pressure but Cbgs are well controlled.   Hypertension  Diabetes are controlled   History of CVA Continue with Plavix.  Chronic diastolic heart failure She appears to be euvolemic at this time.   Body mass index is 37.02 kg/m. Dietary on board.    Anemia of chronic disease:  Hemoglobin stable between 9-10   Hypercalcemia:  Monitor.    DVT prophylaxis: (Lovenox. ) Code Status: (Full Code) Family Communication: none at bedside.  Disposition:   Status is: Inpatient  Remains inpatient appropriate because:Unsafe d/c plan  Dispo: The patient is from: SNF              Anticipated d/c is to: SNF              Patient currently is not medically stable to d/c.   Difficult to place patient No       Level of care: ICU Consultants:  PCCM.  Procedures:none.   Antimicrobials: none.    Subjective: Overnight patient had some bloody tracheal secretions probably secondary to hyperinflation  Objective: Vitals:   11/19/20 1233 11/19/20 1300 11/19/20 1400 11/19/20 1405  BP:  140/76 113/74   Pulse: 80  89 87  Resp: _0 Temp:  TempSrc:      SpO2: 99%  100% 99%  Weight:      Height:        Intake/Output Summary (Last 24 hours) at 11/19/2020 1433 Last data filed at 11/19/2020 0500 Gross per 24 hour  Intake 90 ml  Output 700 ml  Net -610 ml    Filed Weights   11/13/20 0355 11/15/20 0500 11/19/20 0500  Weight: 89.5 kg 89 kg 91.8 kg    Examination:  General exam: Chronically ill-appearing lady on mechanical ventilation Respiratory system: Clear to  auscultation bilaterally no wheezing or rhonchi, on vent support regular Cardiovascular system: Rate and rhythm, no JVD or pedal edema Gastrointestinal system: Abdomen is soft, bowel sounds normal Central nervous system: Opens eyes on verbal or tactile cues, not tracking or following commands no Extremities: No pedal edema Skin: sacral pressure injury.  Psychiatry: Cannot be assessed    Data Reviewed: I have personally reviewed following labs and imaging studies  CBC: Recent Labs  Lab 11/15/20 0811 11/19/20 0106  WBC 8.3 7.9  NEUTROABS  --  4.7  HGB 10.0* 9.8*  HCT 32.3* 31.9*  MCV 88.3 88.4  PLT 249 212     Basic Metabolic Panel: Recent Labs  Lab 11/15/20 0811 11/19/20 0106  NA 141 142  K 4.2 4.1  CL 103 107  CO2 31 28  GLUCOSE 85 135*  BUN 29* 27*  CREATININE 0.45 0.47  CALCIUM 10.8* 10.4*     GFR: Estimated Creatinine Clearance: 72.9 mL/min (by C-G formula based on SCr of 0.47 mg/dL).  Liver Function Tests: No results for input(s): AST, ALT, ALKPHOS, BILITOT, PROT, ALBUMIN in the last 168 hours.  CBG: Recent Labs  Lab 11/18/20 1948 11/18/20 2334 11/19/20 0332 11/19/20 0712 11/19/20 1130  GLUCAP 122* 102* 144* 107* 103*      No results found for this or any previous visit (from the past 240 hour(s)).         Radiology Studies: DG Chest Port 1 View  Result Date: 11/19/2020 CLINICAL DATA:  Hemoptysis, respiratory distress EXAM: PORTABLE CHEST 1 VIEW COMPARISON:  11/12/2020 FINDINGS: Tracheostomy is unchanged. The retaining cuff of the device appears hyperinflated and appears to expand the trachea, similar to prior examination mild left basilar atelectasis. Lungs are otherwise clear. No pneumothorax or pleural effusion. Cardiac size within normal limits. Pulmonary vascularity is normal. No acute bone abnormality. IMPRESSION: Hyperinflation of the retaining cuff of the tracheostomy, expanding the airway. Revision may be warranted. Minimal left  basilar atelectasis. Electronically Signed   By: Fidela Salisbury MD   On: 11/19/2020 01:02        Scheduled Meds:  arformoterol  15 mcg Nebulization BID   baclofen  5 mg Per Tube TID   budesonide  0.5 mg Nebulization BID   chlorhexidine gluconate (MEDLINE KIT)  15 mL Mouth Rinse BID   Chlorhexidine Gluconate Cloth  6 each Topical Daily   clonazePAM  0.5 mg Per Tube BID   clopidogrel  75 mg Per Tube Daily   enoxaparin (LOVENOX) injection  0.5 mg/kg Subcutaneous Q24H   famotidine  20 mg Per Tube BID   feeding supplement (PROSource TF)  45 mL Per Tube BID   fiber  1 packet Per Tube BID   Gerhardt's butt cream   Topical BID   guaiFENesin  5 mL Per Tube Q6H   hydrocortisone cream   Topical TID   insulin aspart  0-15 Units Subcutaneous Q4H   insulin glargine  36 Units  Subcutaneous QHS   levETIRAcetam  2,500 mg Per Tube BID   loratadine  10 mg Per Tube Daily   mouth rinse  15 mL Mouth Rinse 10 times per day   montelukast  10 mg Per Tube Daily   nutrition supplement (JUVEN)  1 packet Per Tube BID   revefenacin  175 mcg Nebulization Daily   tiZANidine  2 mg Per Tube QHS   valproic acid  1,000 mg Per Tube Q12H   Continuous Infusions:  sodium chloride     feeding supplement (JEVITY 1.5 CAL/FIBER) 1,000 mL (11/19/20 0550)     LOS: 49 days        Hosie Poisson, MD [facility]   To contact the attending provider between 7A-7P or the covering provider during after hours 7P-7A, please log into the web site [email or web address] and access using universal [place] password for that web site. If you do not have the password, please call the hospital operator.  11/19/2020, 2:33 PM

## 2020-11-19 NOTE — Progress Notes (Signed)
Difficulty with ventilation. Breath stacking, Peak Pressures >35, insufficient tidal volumes. In line suction with lavage given via RT, small amount of secretions noted. Patient given PRN fentanyl for synchrony.   Within the hour patient is having same symptoms. Two RN in room to assess, facial tremors and twitches observed. Per patient History this is sign of seizure activity. PRN lorazepam given.

## 2020-11-19 NOTE — Progress Notes (Signed)
Sister at bedside for visitation.   States that patient is suffering, without seeing any improvement over this past year. She is aware that M.Tidmore made a Nature conservation officer form in 2016 at [place] and is wondering if that would be accessible for a family meeting about discussing goals of care with patient's daughter (POA).   Informed family that the care team has attempted to call daughter with no call back this week. Sister was under the impression that the daughter was visiting patient daily.   Sister is one of many siblings and understands that her daughter is legal POA, but thinks that having M. Audino wishes in hand could help direct patient care going forward.

## 2020-11-20 DIAGNOSIS — Z9911 Dependence on respirator [ventilator] status: Secondary | ICD-10-CM | POA: Diagnosis not present

## 2020-11-20 DIAGNOSIS — J9621 Acute and chronic respiratory failure with hypoxia: Secondary | ICD-10-CM | POA: Diagnosis not present

## 2020-11-20 DIAGNOSIS — A419 Sepsis, unspecified organism: Secondary | ICD-10-CM | POA: Diagnosis not present

## 2020-11-20 LAB — POCT I-STAT 7, (LYTES, BLD GAS, ICA,H+H)
Acid-Base Excess: 8 mmol/L — ABNORMAL HIGH (ref 0.0–2.0)
Bicarbonate: 33.6 mmol/L — ABNORMAL HIGH (ref 20.0–28.0)
Calcium, Ion: 1.58 mmol/L (ref 1.15–1.40)
HCT: 33 % — ABNORMAL LOW (ref 36.0–46.0)
Hemoglobin: 11.2 g/dL — ABNORMAL LOW (ref 12.0–15.0)
O2 Saturation: 99 %
Patient temperature: 97.4
Potassium: 4.2 mmol/L (ref 3.5–5.1)
Sodium: 145 mmol/L (ref 135–145)
TCO2: 35 mmol/L — ABNORMAL HIGH (ref 22–32)
pCO2 arterial: 52.3 mmHg — ABNORMAL HIGH (ref 32.0–48.0)
pH, Arterial: 7.414 (ref 7.350–7.450)
pO2, Arterial: 127 mmHg — ABNORMAL HIGH (ref 83.0–108.0)

## 2020-11-20 LAB — GLUCOSE, CAPILLARY
Glucose-Capillary: 104 mg/dL — ABNORMAL HIGH (ref 70–99)
Glucose-Capillary: 113 mg/dL — ABNORMAL HIGH (ref 70–99)
Glucose-Capillary: 122 mg/dL — ABNORMAL HIGH (ref 70–99)
Glucose-Capillary: 122 mg/dL — ABNORMAL HIGH (ref 70–99)
Glucose-Capillary: 130 mg/dL — ABNORMAL HIGH (ref 70–99)
Glucose-Capillary: 139 mg/dL — ABNORMAL HIGH (ref 70–99)

## 2020-11-20 NOTE — Progress Notes (Signed)
PROGRESS NOTE    Sonia Drake  YPP:[id number] DOB: March 17, 1956 DOA: 10/14/2020 PCP: Garwin Brothers, MD    Chief Complaint  Patient presents with   Respiratory Distress    Brief Narrative:   65 y.o from SNF , Hx of DM2, CHF, h/o cardiac arrest in 11/2019 result in severe anoxic brain injury, s/p PEG tube, s/p chronic trach presents with acute on chronic respiratory failure.   Bronchoscopy performed on 6/16 showed dynamic airway collapse, tracheal collapsenow vent-dependent, not able to return to SNF. RN reports she was agitated this am, and received two doses of IV fentanyl.     Assessment & Plan:   Principal Problem:   Sepsis ([place]) Active Problems:   COPD (chronic obstructive pulmonary disease) (HCC)   Chronic diastolic CHF (congestive heart failure) (HCC)   Acute on chronic respiratory failure with hypoxia (HCC)   GERD (gastroesophageal reflux disease)   Seizures (HCC)   Type 2 diabetes mellitus without complication, with long-term current use of insulin (HCC)   Severe hypoxic-ischemic encephalopathy   Tracheostomy dependence ([place])   Acute on chronic respiratory failure Probably secondary to dynamic airway collapse.  Currently vent dependent There was a concern for aspiration pneumonia/bacteremia and sepsis on presentation.  Sepsis ruled out.  ID recommends and suggest infection is less likely with reassuring procalcitonin.  She was initially on antibiotics briefly which were discontinued. No distress noted. Bronchodilators as needed.  Vent during the night and failed ATC. Continue with antihistamines.  Pulmonary toilet.  Appreciate PCCM input.   Anoxic brain injury, vegetative state, bedbound status and nonverbal at baseline S/p trach and PEG Neurology suggested patient does not appear to have meaningful recovery.  No change in her mental status  Seizure disorder in the setting of anoxic brain injury Patient currently on clonazepam, Keppra and valproic acid. Seizure  activity    Insulin-dependent diabetes mellitus Hemoglobin A1c at 7 Continue sliding scale insulin. CBG (last 3)  Recent Labs    11/20/20 0801 11/20/20 1137 11/20/20 1539  GLUCAP 130* 113* 122*   Changes in medications  Hypertension  Blood pressure parameters are optimal   History of CVA   Chronic diastolic heart failure Appears euvolemic   Body mass index is 37.02 kg/m. Dietary on board.    Anemia of chronic disease:  Hemoglobin stable between 9-10   Hypercalcemia:  Monitor.    DVT prophylaxis: (Lovenox. ) Code Status: (Full Code) Family Communication: none at bedside.  Discussed with daughter over the phone Disposition:   Status is: Inpatient  Remains inpatient appropriate because:Unsafe d/c plan  Dispo: The patient is from: SNF              Anticipated d/c is to: SNF              Patient currently is not medically stable to d/c.   Difficult to place patient No       Level of care: ICU Consultants:  PCCM.  Procedures:none.   Antimicrobials: none.    Subjective: Appears comfortable  Objective: Vitals:   11/20/20 1541 11/20/20 1600 11/20/20 1700 11/20/20 1800  BP:  125/75 104/61 138/85  Pulse:  77 68 75  Resp:  13 14 13   Temp: 97.8 F (36.6 C)     TempSrc: Axillary     SpO2:  98% 99% 99%  Weight:      Height:        Intake/Output Summary (Last 24 hours) at 11/20/2020 1807 Last data filed at 11/20/2020 1700 Gross  per 24 hour  Intake --  Output 1000 ml  Net -1000 ml    Filed Weights   11/13/20 0355 11/15/20 0500 11/19/20 0500  Weight: 89.5 kg 89 kg 91.8 kg    Examination:  General exam: Chronically ill-appearing lady on mechanical ventilation Respiratory system: Air entry fair bilateral no wheezing, on vent support Cardiovascular system: T regular rate and rhythm no JVD or pedal edema Gastrointestinal system: Abdomen is soft nontender bowel sounds normal Central nervous system: Somnolent, does not follow commands   Extremities: No pedal edema Skin: sacral pressure injury.  Psychiatry: Cannot be assessed    Data Reviewed: I have personally reviewed following labs and imaging studies  CBC: Recent Labs  Lab 11/15/20 0811 11/19/20 0106 11/20/20 1253  WBC 8.3 7.9  --   NEUTROABS  --  4.7  --   HGB 10.0* 9.8* 11.2*  HCT 32.3* 31.9* 33.0*  MCV 88.3 88.4  --   PLT 249 212  --      Basic Metabolic Panel: Recent Labs  Lab 11/15/20 0811 11/19/20 0106 11/20/20 1253  NA 141 142 145  K 4.2 4.1 4.2  CL 103 107  --   CO2 31 28  --   GLUCOSE 85 135*  --   BUN 29* 27*  --   CREATININE 0.45 0.47  --   CALCIUM 10.8* 10.4*  --      GFR: Estimated Creatinine Clearance: 72.9 mL/min (by C-G formula based on SCr of 0.47 mg/dL).  Liver Function Tests: No results for input(s): AST, ALT, ALKPHOS, BILITOT, PROT, ALBUMIN in the last 168 hours.  CBG: Recent Labs  Lab 11/19/20 2355 11/20/20 0311 11/20/20 0801 11/20/20 1137 11/20/20 1539  GLUCAP 184* 104* 130* 113* 122*      No results found for this or any previous visit (from the past 240 hour(s)).         Radiology Studies: DG Chest Port 1 View  Result Date: 11/19/2020 CLINICAL DATA:  Hemoptysis, respiratory distress EXAM: PORTABLE CHEST 1 VIEW COMPARISON:  11/12/2020 FINDINGS: Tracheostomy is unchanged. The retaining cuff of the device appears hyperinflated and appears to expand the trachea, similar to prior examination mild left basilar atelectasis. Lungs are otherwise clear. No pneumothorax or pleural effusion. Cardiac size within normal limits. Pulmonary vascularity is normal. No acute bone abnormality. IMPRESSION: Hyperinflation of the retaining cuff of the tracheostomy, expanding the airway. Revision may be warranted. Minimal left basilar atelectasis. Electronically Signed   By: Fidela Salisbury MD   On: 11/19/2020 01:02        Scheduled Meds:  arformoterol  15 mcg Nebulization BID   baclofen  5 mg Per Tube TID    budesonide  0.5 mg Nebulization BID   chlorhexidine gluconate (MEDLINE KIT)  15 mL Mouth Rinse BID   Chlorhexidine Gluconate Cloth  6 each Topical Daily   clonazePAM  0.5 mg Per Tube BID   clopidogrel  75 mg Per Tube Daily   enoxaparin (LOVENOX) injection  0.5 mg/kg Subcutaneous Q24H   famotidine  20 mg Per Tube BID   feeding supplement (PROSource TF)  45 mL Per Tube BID   fiber  1 packet Per Tube BID   Gerhardt's butt cream   Topical BID   guaiFENesin  5 mL Per Tube Q6H   hydrocortisone cream   Topical TID   insulin aspart  0-15 Units Subcutaneous Q4H   insulin glargine  36 Units Subcutaneous QHS   levETIRAcetam  2,500 mg Per Tube BID  loratadine  10 mg Per Tube Daily   mouth rinse  15 mL Mouth Rinse 10 times per day   montelukast  10 mg Per Tube Daily   nutrition supplement (JUVEN)  1 packet Per Tube BID   revefenacin  175 mcg Nebulization Daily   tiZANidine  2 mg Per Tube QHS   valproic acid  1,000 mg Per Tube Q12H   Continuous Infusions:  sodium chloride     feeding supplement (JEVITY 1.5 CAL/FIBER) 1,000 mL (11/20/20 0541)     LOS: 50 days        Hosie Poisson, MD [facility]   To contact the attending provider between 7A-7P or the covering provider during after hours 7P-7A, please log into the web site [email or web address] and access using universal [place] password for that web site. If you do not have the password, please call the hospital operator.  11/20/2020, 6:07 PM

## 2020-11-20 NOTE — Progress Notes (Addendum)
NAME:  Sonia Drake, MRN:  [id number], DOB:  [birth year], LOS: 50 ADMISSION DATE:  10/19/2020, CONSULTATION DATE:  6/15 REFERRING MD:  Thedore Mins, CHIEF COMPLAINT:  Dyspnea   History of Present Illness:  65 y/o female with anoxic brain injury, tracheostomy status who was admitted from a SNF on 6/12 with worsening respiratory failure.  She had previously been ventilator dependent but had been weaned from the trach.  On 6/16 a bronchoscopy revealed dynamic collapse of her trachea, tracheostomy was replaced.  Later required mechanical ventilation.   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 No distress. WBC ct trending down. No events overnight growing GPC clusters in two of two cultures  6/16 increased work of breathing, upper airway noise, abdominal muscle use.  Bronchoscopy performed as trach exchange cuffless #8 Shiley XLT showed dynamic airway collapse, tracheal collapse. 6/16 continued respiratory distress moved to ICU, changed to cuffed #6 Shiley XLT, placed on MV with some improved comfort 6/12 cefepime > 6/14, 6/18 6/12 vanc > 6/14, 6/18  6/23 mucus plugging, severe vent dyssynchrony 6/24-25 intermittent jerking, seizures on EEG 6/27 weaned off propofol gtt  6/28 briefly tolerated trach collar in AM 6/29 tolerated trach collar in AM; no further seizure like activity; working with CM/SW for vent-SNF 7/1 Weaning on CPAP/ PS 10/5, CM SW working on [employer], NO seizure activity, secretions are not an issue per nursing 7/11 trach changed  to #8 Bivona with air cuff, much improved. 7/12 TCT 12 hours; rested on Vent overnight 7/13 on TCT 7/23 Trach collar tolerated 7/24 Full vent support 7/26 Remains on full vent support this am, no issues with secretions per nursing 7/27 Failed PSV with apena. No ATC since 7/24.    Interim History / Subjective:  RN reports pt remains of full support - not interactive, increased secretions  Per note on 7/31, sister feels patient is suffering.  Asking for GOC with POA as she would like to help with decisions for patient.   Objective   Blood pressure (!) 134/90, pulse 100, temperature 97.7 F (36.5 C), temperature source Oral, resp. rate (!) 27, height 5\' 2"  (1.575 m), weight 91.4 kg, SpO2 96 %.    Vent Mode: PRVC FiO2 (%):  [40 %] 40 % Set Rate:  [20 bmp] 20 bmp Vt Set:  [400 mL] 400 mL PEEP:  [5 cmH20] 5 cmH20 Plateau Pressure:  [14 cmH20-19 cmH20] 14 cmH20   Intake/Output Summary (Last 24 hours) at 11/20/2020 0919 Last data filed at 11/20/2020 0800 Gross per 24 hour  Intake --  Output 1400 ml  Net -1400 ml   Filed Weights   11/13/20 0355 11/15/20 0500 11/19/20 0500  Weight: 89.5 kg 89 kg 91.8 kg   Physical Exam: General: chronically ill appearing adult female lying in bed on vnet  HEENT: MM pink/moist, #8 portex bivona trach midline c/d/i Neuro: no response to voice, does not open eyes, no withdraw to painful stimuli CV: s1s2 RRR, no m/r/g PULM: non-labored on vent, lungs bilaterally with soft rhonchi  GI: soft, bsx4 active, PEG / tolerating TF  Extremities: warm/dry, no edema  Skin: no rashes  or lesions  Studies:  VQ scan 6/15> Neg for acute PE CT Head wo Contrast 6/24 > No definite acute intracranial abnormality, progressive cerebral atrophy w/chronic small vessel ischemic disease, acute L sphenoid sinusitis MRI Brain wo Contrast 6/28>  advanced atrophy, old Lt occipital infarct  Resolved Hospital Problem list   Circulatory shock Hemoptysis  Assessment & Plan:    Acute on chronic hypoxic/hypercapnic respiratory failure in setting of anoxic encephalopathy, tracheobronchomalacia, and severe COPD. S/P biovana #8, legnth 140 on 7/11. Still requiring MV at night.  Fails ATC attempts with apnea.  Previously able to tolerate TCT however >72 hours on full support -continue PSV trials daily with goal to return to ATC but suspect this will not be likely  -PRVC 8cc/kg as rest mode  -wean O2 for sats 88-96% -assess ABG to ensure rate ok, may need to decrease -follow tracheal secretions -continue guaifenesin for secretions -VAP prevention measures  -continue brovana, yupelri, pulmicort, singulair -PRN albuterol  -follow intermittent CXR > note 7/31 raises question of hyperinflated balloon on trach. Will ask RT to evaluate balloon pressure.  -trach care per protocol with PRN suctioning   Anoxic encephalopathy after cardiac arrest with persistent vegetative state. Seizure disorder. Hypertension. DM type 2. Hx of CVA. Obesity  -per primary team    PCCM will continue to follow intermittently for trach care.  Please call if  new needs arise.    Signature:   Canary Brim, MSN, APRN, NP-C, AGACNP-BC [facility] 11/20/2020, 9:19 AM   Please see [email or web address] for pager details.   From 7A-7P if no response, please call [phone] After hours, please call ELink [phone]

## 2020-11-21 DIAGNOSIS — J9621 Acute and chronic respiratory failure with hypoxia: Secondary | ICD-10-CM | POA: Diagnosis not present

## 2020-11-21 LAB — GLUCOSE, CAPILLARY
Glucose-Capillary: 123 mg/dL — ABNORMAL HIGH (ref 70–99)
Glucose-Capillary: 115 mg/dL — ABNORMAL HIGH (ref 70–99)
Glucose-Capillary: 118 mg/dL — ABNORMAL HIGH (ref 70–99)
Glucose-Capillary: 113 mg/dL — ABNORMAL HIGH (ref 70–99)
Glucose-Capillary: 158 mg/dL — ABNORMAL HIGH (ref 70–99)
Glucose-Capillary: 133 mg/dL — ABNORMAL HIGH (ref 70–99)

## 2020-11-21 NOTE — Progress Notes (Signed)
PROGRESS NOTE    Sonia Drake  TDH:[id number] DOB: Mar 06, 1956 DOA: 10/02/2020 PCP: Garwin Brothers, MD    Chief Complaint  Patient presents with   Respiratory Distress    Brief Narrative:   65 y.o from SNF , Hx of DM2, CHF, h/o cardiac arrest in 11/2019 result in severe anoxic brain injury, s/p PEG tube, s/p chronic trach presents with acute on chronic respiratory failure.   Bronchoscopy performed on 6/16 showed dynamic airway collapse, tracheal collapsenow vent-dependent, not able to return to SNF. No new events overnight.  She appears comfortable.     Assessment & Plan:   Principal Problem:   Sepsis ([place]) Active Problems:   COPD (chronic obstructive pulmonary disease) (HCC)   Chronic diastolic CHF (congestive heart failure) (HCC)   Acute on chronic respiratory failure with hypoxia (HCC)   GERD (gastroesophageal reflux disease)   Seizures (HCC)   Type 2 diabetes mellitus without complication, with long-term current use of insulin (HCC)   Severe hypoxic-ischemic encephalopathy   Tracheostomy dependence ([place])   Acute on chronic respiratory failure Probably secondary to dynamic airway collapse.  Currently vent dependent There was a concern for aspiration pneumonia/bacteremia and sepsis on presentation.  Sepsis ruled out.  ID recommends and suggest infection is less likely with reassuring procalcitonin.  She was initially on antibiotics briefly which were discontinued. No distress noted. Bronchodilators as needed.  Vent during the night and failed ATC. Continue with antihistamines.  Pulmonary toilet.  Appreciate PCCM input. No changes in meds.    Anoxic brain injury, vegetative state, bedbound status and nonverbal at baseline S/p trach and PEG Neurology suggested patient does not appear to have meaningful recovery.   No change in her mental status. Is not opening eyes on tactile or verbal cues today.   Seizure disorder in the setting of anoxic brain injury Patient currently  on clonazepam, Keppra and valproic acid. No seizure  activity.     Insulin-dependent diabetes mellitus Hemoglobin A1c at 7 Continue sliding scale insulin. CBG (last 3)  Recent Labs    11/21/20 0323 11/21/20 0734 11/21/20 1204  GLUCAP 123* 115* 118*   No changes in medications.   Hypertension  Blood pressure parameters are optimal.    History of CVA   Chronic diastolic heart failure Appears euvolemic   Body mass index is 37.02 kg/m. Dietary on board.    Anemia of chronic disease:  Hemoglobin stable between 9-10   Hypercalcemia:  Monitor.    DVT prophylaxis: (Lovenox. ) Code Status: (Full Code) Family Communication: none at bedside.  Discussed with daughter over the phone on 11/20/20 Disposition:   Status is: Inpatient  Remains inpatient appropriate because:Unsafe d/c plan  Dispo: The patient is from: SNF              Anticipated d/c is to: SNF              Patient currently is not medically stable to d/c.   Difficult to place patient No       Level of care: ICU Consultants:  PCCM.  Procedures:none.   Antimicrobials: none.    Subjective: No new events overnight.   Objective: Vitals:   11/21/20 1109 11/21/20 1200 11/21/20 1206 11/21/20 1300  BP: (!) 143/54 (!) 135/59  (!) 147/66  Pulse: 96 95  (!) 105  Resp: (!) 23 (!) 21  (!) 25  Temp:   98.5 F (36.9 C)   TempSrc:   Axillary   SpO2: 96% 95%  96%  Weight:      Height:        Intake/Output Summary (Last 24 hours) at 11/21/2020 1504 Last data filed at 11/21/2020 1200 Gross per 24 hour  Intake 270 ml  Output 600 ml  Net -330 ml    Filed Weights   11/13/20 0355 11/15/20 0500 11/19/20 0500  Weight: 89.5 kg 89 kg 91.8 kg    Examination:  General exam: Chronically ill-appearing lady appears stable, on vent support.  Respiratory system: Air entry fair no wheezing heard.  Cardiovascular system: RRR no JVD, no pedal edema.  Gastrointestinal system: Abdomen is soft NT BS+ Central  nervous system: Somnolent, does not follow commands , is not opening eyes to verbal cues.  Extremities: No pedal edema Skin: sacral pressure injury.  Psychiatry: Cannot be assessed    Data Reviewed: I have personally reviewed following labs and imaging studies  CBC: Recent Labs  Lab 11/15/20 0811 11/19/20 0106 11/20/20 1253  WBC 8.3 7.9  --   NEUTROABS  --  4.7  --   HGB 10.0* 9.8* 11.2*  HCT 32.3* 31.9* 33.0*  MCV 88.3 88.4  --   PLT 249 212  --      Basic Metabolic Panel: Recent Labs  Lab 11/15/20 0811 11/19/20 0106 11/20/20 1253  NA 141 142 145  K 4.2 4.1 4.2  CL 103 107  --   CO2 31 28  --   GLUCOSE 85 135*  --   BUN 29* 27*  --   CREATININE 0.45 0.47  --   CALCIUM 10.8* 10.4*  --      GFR: Estimated Creatinine Clearance: 72.9 mL/min (by C-G formula based on SCr of 0.47 mg/dL).  Liver Function Tests: No results for input(s): AST, ALT, ALKPHOS, BILITOT, PROT, ALBUMIN in the last 168 hours.  CBG: Recent Labs  Lab 11/20/20 1947 11/20/20 2348 11/21/20 0323 11/21/20 0734 11/21/20 1204  GLUCAP 122* 139* 123* 115* 118*      No results found for this or any previous visit (from the past 240 hour(s)).         Radiology Studies: No results found.      Scheduled Meds:  arformoterol  15 mcg Nebulization BID   baclofen  5 mg Per Tube TID   budesonide  0.5 mg Nebulization BID   chlorhexidine gluconate (MEDLINE KIT)  15 mL Mouth Rinse BID   Chlorhexidine Gluconate Cloth  6 each Topical Daily   clonazePAM  0.5 mg Per Tube BID   clopidogrel  75 mg Per Tube Daily   enoxaparin (LOVENOX) injection  0.5 mg/kg Subcutaneous Q24H   famotidine  20 mg Per Tube BID   feeding supplement (PROSource TF)  45 mL Per Tube BID   fiber  1 packet Per Tube BID   Gerhardt's butt cream   Topical BID   guaiFENesin  5 mL Per Tube Q6H   hydrocortisone cream   Topical TID   insulin aspart  0-15 Units Subcutaneous Q4H   insulin glargine  36 Units Subcutaneous QHS    levETIRAcetam  2,500 mg Per Tube BID   loratadine  10 mg Per Tube Daily   mouth rinse  15 mL Mouth Rinse 10 times per day   montelukast  10 mg Per Tube Daily   nutrition supplement (JUVEN)  1 packet Per Tube BID   revefenacin  175 mcg Nebulization Daily   tiZANidine  2 mg Per Tube QHS   valproic acid  1,000 mg Per Tube Q12H  Continuous Infusions:  sodium chloride     feeding supplement (JEVITY 1.5 CAL/FIBER) 1,000 mL (11/20/20 0541)     LOS: 51 days        Hosie Poisson, MD [facility]   To contact the attending provider between 7A-7P or the covering provider during after hours 7P-7A, please log into the web site [email or web address] and access using universal [place] password for that web site. If you do not have the password, please call the hospital operator.  11/21/2020, 3:04 PM

## 2020-11-21 NOTE — Progress Notes (Signed)
Nutrition Follow-up  DOCUMENTATION CODES:   Not applicable  INTERVENTION:   Continue tube feeds via PEG: - Jevity 1.5 @ 45 ml/hr (1080 ml/day) - ProSource TF 45 ml BID   Tube feeding regimen provides 1700 kcal, 91 grams of protein, and 821 ml of H2O.   - Continue Juven BID via PEG, each packet provides 80 calories, 8 grams of carbohydrate, 2.5  grams of protein, 7 grams of L-arginine and 7 grams of L-glutamine; supplement contains CaHMB, vitamins C, E, B12 and zinc to promote wound healing  NUTRITION DIAGNOSIS:   Increased nutrient needs related to wound healing as evidenced by estimated needs.  Ongoing, being addressed via TF  GOAL:   Patient will meet greater than or equal to 90% of their needs  Met via TF  MONITOR:   Vent status, Labs, Weight trends, TF tolerance, Skin, I & O's  REASON FOR ASSESSMENT:   Consult Enteral/tube feeding initiation and management  ASSESSMENT:   Sonia Drake is a 65 y.o. female with medical history significant of asthma/COPD, CHF, cardiac arrest with PEA/CPR with chronic respiratory failure on permanent trach with supplemental oxygen at 6L, type 2 DM, GERD, seizures and severe hypoxic-ischemic encephalopathy who presented to ER via EMS for respiratory distress from SNF.  Discussed pt with RN and during ICU rounds. Pt has not been on TC since 7/24. Pt tolerating current TF without issue.  Admit weight: 88.4 kg Current weight: 91.8 kg  Pt continues to have +2 pitting edema to BUE and BLE.  Patient remains on ventilator support via trach MV: 7.8 L/min Temp (24hrs), Avg:98 F (36.7 C), Min:97.4 F (36.3 C), Max:98.7 F (37.1 C)  Current TF: Jevity 1.5 @ 45 ml/hr, ProSource TF 45 ml BID   Medications reviewed and include: pepcid, nutrisource fiber, SSI q 4 hours, lantus 36 units daily, Juven BID  Labs reviewed: ionized calcium 1.58 CBG's: 113-139 x 24 hours  UOP: 1000 ml x 24 hours  Diet Order:   Diet Order              Diet NPO time specified  Diet effective now                   EDUCATION NEEDS:   No education needs have been identified at this time  Skin:  Skin Assessment: Skin Integrity Issues: Stage II: buttocks Other: MASD to buttocks  Last BM:  11/21/20 medium type 6  Height:   Ht Readings from Last 1 Encounters:  10/12/20 _0  (1.575 m)    Weight:   Wt Readings from Last 1 Encounters:  11/19/20 91.8 kg    Ideal Body Weight:  45.5 kg  BMI:  Body mass index is 37.02 kg/m.  Estimated Nutritional Needs:   Kcal:  1600-1800  Protein:  90-105 grams  Fluid:  > 1.6 L    Gustavus Bryant, MS, RD, LDN Inpatient Clinical Dietitian Please see AMiON for contact information.

## 2020-11-22 DIAGNOSIS — J9621 Acute and chronic respiratory failure with hypoxia: Secondary | ICD-10-CM | POA: Diagnosis not present

## 2020-11-22 LAB — CBC
HCT: 35.7 % — ABNORMAL LOW (ref 36.0–46.0)
Hemoglobin: 10.7 g/dL — ABNORMAL LOW (ref 12.0–15.0)
MCH: 26.6 pg (ref 26.0–34.0)
MCHC: 30 g/dL (ref 30.0–36.0)
MCV: 88.8 fL (ref 80.0–100.0)
Platelets: 237 10*3/uL (ref 150–400)
RBC: 4.02 MIL/uL (ref 3.87–5.11)
RDW: 17 % — ABNORMAL HIGH (ref 11.5–15.5)
WBC: 10.1 10*3/uL (ref 4.0–10.5)
nRBC: 0 % (ref 0.0–0.2)

## 2020-11-22 LAB — BASIC METABOLIC PANEL
Anion gap: 6 (ref 5–15)
BUN: 24 mg/dL — ABNORMAL HIGH (ref 8–23)
CO2: 30 mmol/L (ref 22–32)
Calcium: 10.7 mg/dL — ABNORMAL HIGH (ref 8.9–10.3)
Chloride: 104 mmol/L (ref 98–111)
Creatinine, Ser: 0.44 mg/dL (ref 0.44–1.00)
GFR, Estimated: >60 mL/min (ref >60)
Glucose, Bld: 142 mg/dL — ABNORMAL HIGH (ref 70–99)
Potassium: 4 mmol/L (ref 3.5–5.1)
Sodium: 140 mmol/L (ref 135–145)

## 2020-11-22 LAB — GLUCOSE, CAPILLARY
Glucose-Capillary: 111 mg/dL — ABNORMAL HIGH (ref 70–99)
Glucose-Capillary: 121 mg/dL — ABNORMAL HIGH (ref 70–99)
Glucose-Capillary: 131 mg/dL — ABNORMAL HIGH (ref 70–99)
Glucose-Capillary: 139 mg/dL — ABNORMAL HIGH (ref 70–99)
Glucose-Capillary: 140 mg/dL — ABNORMAL HIGH (ref 70–99)
Glucose-Capillary: 141 mg/dL — ABNORMAL HIGH (ref 70–99)

## 2020-11-22 MED ORDER — LACTATED RINGERS IV BOLUS
500.0000 mL | Freq: Once | INTRAVENOUS | Status: AC
  Administered 2020-11-22: 500 mL via INTRAVENOUS

## 2020-11-22 NOTE — Progress Notes (Signed)
PROGRESS NOTE    Sonia Drake  ENI:[id number] DOB: 06-23-1955 DOA: 09/20/2020 PCP: Garwin Brothers, MD    Chief Complaint  Patient presents with   Respiratory Distress    Brief Narrative:   65 y.o from SNF , Hx of DM2, CHF, h/o cardiac arrest in 11/2019 result in severe anoxic brain injury, s/p PEG tube, s/p chronic trach presents with acute on chronic respiratory failure.   Bronchoscopy performed on 6/16 showed dynamic airway collapse, tracheal collapsenow vent-dependent, not able to return to SNF.  Pt seen and examined, no new events overnight.     Assessment & Plan:   Principal Problem:   Sepsis ([place]) Active Problems:   COPD (chronic obstructive pulmonary disease) (HCC)   Chronic diastolic CHF (congestive heart failure) (HCC)   Acute on chronic respiratory failure with hypoxia (HCC)   GERD (gastroesophageal reflux disease)   Seizures (HCC)   Type 2 diabetes mellitus without complication, with long-term current use of insulin (HCC)   Severe hypoxic-ischemic encephalopathy   Tracheostomy dependence ([place])   Acute on chronic respiratory failure Probably secondary to dynamic airway collapse.  Currently vent dependent There was a concern for aspiration pneumonia/bacteremia and sepsis on presentation.  Sepsis ruled out.  ID recommends and suggest infection is less likely with reassuring procalcitonin.  She was initially on antibiotics briefly which were discontinued. No distress noted. Bronchodilators as needed.  Vent during the night and failed ATC. Continue with antihistamines.  Pulmonary toilet.  Appreciate PCCM input.  No changes in meds.    Anoxic brain injury, vegetative state, bedbound status and nonverbal at baseline S/p trach and PEG Neurology suggested patient does not appear to have meaningful recovery.   No change in her mental status.   Seizure disorder in the setting of anoxic brain injury Patient currently on clonazepam, Keppra and valproic acid. No seizure   activity.     Insulin-dependent diabetes mellitus Hemoglobin A1c at 7 Continue sliding scale insulin. CBG (last 3)  Recent Labs    11/21/20 2325 11/22/20 0328 11/22/20 0802  GLUCAP 133* 139* 141*   No changes in meds.   Hypertension  Blood pressure parameters are borderline normal    History of CVA   Chronic diastolic heart failure Appears euvolemic   Body mass index is 37.14 kg/m. Dietary on board.    Anemia of chronic disease:  Hemoglobin stable between 9-10   Hypercalcemia:  Monitor.   Bilateral upper extremity dependent edema:  BP parameters are borderline to give some lasix.     DVT prophylaxis: (Lovenox. ) Code Status: (Full Code) Family Communication: none at bedside.  Discussed with daughter over the phone on 11/20/20 Disposition:   Status is: Inpatient  Remains inpatient appropriate because:Unsafe d/c plan  Dispo: The patient is from: SNF              Anticipated d/c is to: SNF              Patient currently is not medically stable to d/c.   Difficult to place patient No       Level of care: ICU Consultants:  PCCM.  Procedures:none.   Antimicrobials: none.    Subjective: No events overnight.   Objective: Vitals:   11/22/20 0600 11/22/20 0700 11/22/20 0837 11/22/20 1109  BP:  (!) 113/57 (!) 111/54 (!) 104/44  Pulse: 92 88  86  Resp: (!) 22 (!) 21  19  Temp:  98.2 F (36.8 C)    TempSrc:  Axillary  SpO2: 99% 97% 98% 97%  Weight:      Height:        Intake/Output Summary (Last 24 hours) at 11/22/2020 1115 Last data filed at 11/22/2020 0700 Gross per 24 hour  Intake 970 ml  Output 1300 ml  Net -330 ml    Filed Weights   11/15/20 0500 11/19/20 0500 11/22/20 0428  Weight: 89 kg 91.8 kg 92.1 kg    Examination:  General exam: Chronically ill-appearing lady , not in distress on vent support.  Respiratory system: clear to auscultation, no wheezing heard.  Cardiovascular system: RRR , no pedal edema, no JVD,.   Gastrointestinal system: Abdomen is soft non distended, bowel sounds wnl.  Central nervous system: unresponsive, not following commands. .  Extremities: No leg edema.  Skin: sacral pressure injury.  Psychiatry: Cannot be assessed.     Data Reviewed: I have personally reviewed following labs and imaging studies  CBC: Recent Labs  Lab 11/19/20 0106 11/20/20 1253 11/22/20 0755  WBC 7.9  --  10.1  NEUTROABS 4.7  --   --   HGB 9.8* 11.2* 10.7*  HCT 31.9* 33.0* 35.7*  MCV 88.4  --  88.8  PLT 212  --  237     Basic Metabolic Panel: Recent Labs  Lab 11/19/20 0106 11/20/20 1253 11/22/20 0755  NA 142 145 140  K 4.1 4.2 4.0  CL 107  --  104  CO2 28  --  30  GLUCOSE 135*  --  142*  BUN 27*  --  24*  CREATININE 0.47  --  0.44  CALCIUM 10.4*  --  10.7*     GFR: Estimated Creatinine Clearance: 73.1 mL/min (by C-G formula based on SCr of 0.44 mg/dL).  Liver Function Tests: No results for input(s): AST, ALT, ALKPHOS, BILITOT, PROT, ALBUMIN in the last 168 hours.  CBG: Recent Labs  Lab 11/21/20 1555 11/21/20 1942 11/21/20 2325 11/22/20 0328 11/22/20 0802  GLUCAP 113* 158* 133* 139* 141*      No results found for this or any previous visit (from the past 240 hour(s)).         Radiology Studies: No results found.      Scheduled Meds:  arformoterol  15 mcg Nebulization BID   baclofen  5 mg Per Tube TID   budesonide  0.5 mg Nebulization BID   chlorhexidine gluconate (MEDLINE KIT)  15 mL Mouth Rinse BID   Chlorhexidine Gluconate Cloth  6 each Topical Daily   clonazePAM  0.5 mg Per Tube BID   clopidogrel  75 mg Per Tube Daily   enoxaparin (LOVENOX) injection  0.5 mg/kg Subcutaneous Q24H   famotidine  20 mg Per Tube BID   feeding supplement (PROSource TF)  45 mL Per Tube BID   fiber  1 packet Per Tube BID   Gerhardt's butt cream   Topical BID   guaiFENesin  5 mL Per Tube Q6H   hydrocortisone cream   Topical TID   insulin aspart  0-15 Units  Subcutaneous Q4H   insulin glargine  36 Units Subcutaneous QHS   levETIRAcetam  2,500 mg Per Tube BID   loratadine  10 mg Per Tube Daily   mouth rinse  15 mL Mouth Rinse 10 times per day   montelukast  10 mg Per Tube Daily   nutrition supplement (JUVEN)  1 packet Per Tube BID   revefenacin  175 mcg Nebulization Daily   tiZANidine  2 mg Per Tube QHS   valproic acid  1,000 mg Per Tube Q12H   Continuous Infusions:  sodium chloride     feeding supplement (JEVITY 1.5 CAL/FIBER) 1,000 mL (11/20/20 0541)     LOS: 52 days        Hosie Poisson, MD [facility]   To contact the attending provider between 7A-7P or the covering provider during after hours 7P-7A, please log into the web site [email or web address] and access using universal  password for that web site. If you do not have the password, please call the hospital operator.  11/22/2020, 11:15 AM

## 2020-11-22 NOTE — TOC Progression Note (Addendum)
Transition of Care [facility]) - Progression Note    Patient Details  Name: Sonia Drake MRN: [id number] Date of Birth: 1955/11/15  [facility]) CM/SW Contact  Kermit Balo, RN Phone Number: 11/22/2020, 1:15 PM  Clinical Narrative:    CM spoke to Hayti at [facility]. She states she has not received the referral for vent SNF. CM has faxed her the referral: [phone] CM has left voicemail for [facility] about potential admission.  TOC following.  1415: Received a call back from [facility]--they are not able to offer a bed at this time.   Expected Discharge Plan: Skilled Nursing Facility Barriers to Discharge: No SNF bed  Expected Discharge Plan and Services Expected Discharge Plan: Skilled Nursing Facility   Discharge Planning Services: CM Consult                                           Social Determinants of Health (SDOH) Interventions    Readmission Risk Interventions No flowsheet data found.

## 2020-11-23 DIAGNOSIS — J9621 Acute and chronic respiratory failure with hypoxia: Secondary | ICD-10-CM | POA: Diagnosis not present

## 2020-11-23 LAB — GLUCOSE, CAPILLARY
Glucose-Capillary: 121 mg/dL — ABNORMAL HIGH (ref 70–99)
Glucose-Capillary: 123 mg/dL — ABNORMAL HIGH (ref 70–99)
Glucose-Capillary: 124 mg/dL — ABNORMAL HIGH (ref 70–99)
Glucose-Capillary: 99 mg/dL (ref 70–99)
Glucose-Capillary: 149 mg/dL — ABNORMAL HIGH (ref 70–99)

## 2020-11-23 NOTE — TOC Progression Note (Addendum)
Transition of Care [facility]) - Progression Note    Patient Details  Name: LYNE KHURANA MRN: [id number] Date of Birth: [birth year]  Transition of Care [facility]) CM/SW Contact  Huston Foley Jacklynn Ganong, RN Phone Number: 11/23/2020, 11:07 AM  Clinical Narrative:   Case manager contacted and requested that Willadean Carol at [facility] review patient. Ph. [phone], She asked that all pertinent information be faxed to her at: [phone], and she will have reviewed. CM also left message at [facility] [phone], called Clydie Braun at [facility] [phone], unable to leave message. TOC Team will continue to follow.  1537: Jenni called CM, states that patient will have to be off of Fentanyl and she needs info concerning patient's prognosis.Antony Contras will also contact patient's daughter.  CM will update MD and Social Worker, Biomedical scientist.   Expected Discharge Plan: Skilled Nursing Facility Barriers to Discharge: No SNF bed  Expected Discharge Plan and Services Expected Discharge Plan: Skilled Nursing Facility   Discharge Planning Services: CM Consult                                           Social Determinants of Health (SDOH) Interventions    Readmission Risk Interventions No flowsheet data found.

## 2020-11-23 NOTE — Progress Notes (Signed)
PROGRESS NOTE    Sonia Drake  AYO:[id number] DOB: 31-Oct-1955 DOA: 09/23/2020 PCP: Sonia Brothers, MD    Chief Complaint  Patient presents with   Respiratory Distress    Brief Narrative:   65 y.o from SNF , Hx of DM2, CHF, h/o cardiac arrest in 11/2019 result in severe anoxic brain injury, s/p PEG tube, s/p chronic trach presents with acute on chronic respiratory failure.   Bronchoscopy performed on 6/16 showed dynamic airway collapse, tracheal coll[place]enow vent-dependent, not able to return to SNF.  Pt seen and examined at bedside, no events overnight.     Assessment & Plan:   Principal Problem:   Sepsis ([place]) Active Problems:   COPD (chronic obstructive pulmonary disease) (HCC)   Chronic diastolic CHF (congestive heart failure) (HCC)   Acute on chronic respiratory failure with hypoxia (HCC)   GERD (gastroesophageal reflux disease)   Seizures (HCC)   Type 2 diabetes mellitus without complication, with long-term current use of insulin (HCC)   Severe hypoxic-ischemic encephalopathy   Tracheostomy dependence (Sonia Drake)   Acute on chronic respiratory failure Probably secondary to dynamic airway collapse.  Currently vent dependent There was a concern for aspiration pneumonia/bacteremia and sepsis on presentation.  Sepsis ruled out.  ID recommends and suggest infection is less likely with reassuring procalcitonin.  She was initially on antibiotics briefly which were discontinued. No distress noted. Bronchodilators as needed.  Vent during the night and failed ATC. Continue with antihistamines.  Pulmonary toilet.  Appreciate PCCM input.  No changes in meds.    Anoxic brain injury, vegetative state, bedbound status and nonverbal at baseline S/p trach and PEG Neurology suggested patient does not appear to have meaningful recovery.   No change in her mental status.   Seizure disorder in the setting of anoxic brain injury Patient currently on clonazepam, Keppra and valproic acid. No  seizure activity.     Insulin-dependent diabetes mellitus Hemoglobin A1c at 7 Continue sliding scale insulin. CBG (last 3)  Recent Labs    11/23/20 0340 11/23/20 0741 11/23/20 1205  GLUCAP 121* 123* 124*   No changes in meds.   Hypertension  Bp are borderline.    History of CVA   Chronic diastolic heart failure Compensated.    Body mass index is 37.66 kg/m. Dietary on board.    Anemia of chronic disease:  Hemoglobin stable between 9 to 10/    Hypercalcemia:  Monitor.   Bilateral upper extremity dependent edema:  BP parameters are borderline , KEEP MAP>65 .    DVT prophylaxis: (Lovenox. ) Code Status: (Full Code) Family Communication: none at bedside.  Discussed with daughter over the phone on 11/20/20 Disposition:   Status is: Inpatient  Remains inpatient appropriate because:Unsafe d/c plan  Dispo: The patient is from: SNF              Anticipated d/c is to: SNF              Patient currently is not medically stable to d/c.   Difficult to place patient No       Level of care: ICU Consultants:  PCCM.  Procedures:none.   Antimicrobials: none.    Subjective: No no new events overnight.   Objective: Vitals:   11/23/20 1000 11/23/20 1100 11/23/20 1148 11/23/20 1206  BP: (!) 125/54 (!) 99/58 (!) 99/58   Pulse: 80 75    Resp: 16 12 (!) 24   Temp:    97.9 F (36.6 C)  TempSrc:    Oral  SpO2: 98% 99% 99%   Weight:      Height:        Intake/Output Summary (Last 24 hours) at 11/23/2020 1253 Last data filed at 11/23/2020 0600 Gross per 24 hour  Intake 585 ml  Output 1200 ml  Net -615 ml    Filed Weights   11/19/20 0500 11/22/20 0428 11/23/20 0500  Weight: 91.8 kg 92.1 kg 93.4 kg    Examination:  General exam: Chronically ill-appearing lady , on vent support.  Respiratory system: air entry fair, on vent support.  Cardiovascular system: RRR no JVD, no pedal edema Gastrointestinal system: Abdomen is Soft Bowel sounds wnl.  Central  nervous system: alert this am, opening eyes , not following commands.  Extremities: No leg edema.  Skin: sacral pressure injury.  Psychiatry: Cannot be assessed.     Data Reviewed: I have personally reviewed following labs and imaging studies  CBC: Recent Labs  Lab 11/19/20 0106 11/20/20 1253 11/22/20 0755  WBC 7.9  --  10.1  NEUTROABS 4.7  --   --   HGB 9.8* 11.2* 10.7*  HCT 31.9* 33.0* 35.7*  MCV 88.4  --  88.8  PLT 212  --  237     Basic Metabolic Panel: Recent Labs  Lab 11/19/20 0106 11/20/20 1253 11/22/20 0755  NA 142 145 140  K 4.1 4.2 4.0  CL 107  --  104  CO2 28  --  30  GLUCOSE 135*  --  142*  BUN 27*  --  24*  CREATININE 0.47  --  0.44  CALCIUM 10.4*  --  10.7*     GFR: Estimated Creatinine Clearance: 73.6 mL/min (by C-G formula based on SCr of 0.44 mg/dL).  Liver Function Tests: No results for input(s): AST, ALT, ALKPHOS, BILITOT, PROT, ALBUMIN in the last 168 hours.  CBG: Recent Labs  Lab 11/22/20 1952 11/22/20 2340 11/23/20 0340 11/23/20 0741 11/23/20 1205  GLUCAP 140* 121* 121* 123* 124*      No results found for this or any previous visit (from the past 240 hour(s)).         Radiology Studies: No results found.      Scheduled Meds:  arformoterol  15 mcg Nebulization BID   baclofen  5 mg Per Tube TID   budesonide  0.5 mg Nebulization BID   chlorhexidine gluconate (MEDLINE KIT)  15 mL Mouth Rinse BID   Chlorhexidine Gluconate Cloth  6 each Topical Daily   clonazePAM  0.5 mg Per Tube BID   clopidogrel  75 mg Per Tube Daily   enoxaparin (LOVENOX) injection  0.5 mg/kg Subcutaneous Q24H   famotidine  20 mg Per Tube BID   feeding supplement (PROSource TF)  45 mL Per Tube BID   fiber  1 packet Per Tube BID   Gerhardt's butt cream   Topical BID   guaiFENesin  5 mL Per Tube Q6H   hydrocortisone cream   Topical TID   insulin aspart  0-15 Units Subcutaneous Q4H   insulin glargine  36 Units Subcutaneous QHS   levETIRAcetam   2,500 mg Per Tube BID   loratadine  10 mg Per Tube Daily   mouth rinse  15 mL Mouth Rinse 10 times per day   montelukast  10 mg Per Tube Daily   nutrition supplement (JUVEN)  1 packet Per Tube BID   revefenacin  175 mcg Nebulization Daily   tiZANidine  2 mg Per Tube QHS   valproic acid  1,000 mg  Per Tube Q12H   Continuous Infusions:  sodium chloride     feeding supplement (JEVITY 1.5 CAL/FIBER) 1,000 mL (11/20/20 0541)     LOS: 47 days        Hosie Poisson, MD [facility]   To contact the attending provider between 7A-7P or the covering provider during after hours 7P-7A, please log into the web site [email or web address] and access using universal [place] password for that web site. If you do not have the password, please call the hospital operator.  11/23/2020, 12:53 PM

## 2020-11-24 ENCOUNTER — Inpatient Hospital Stay (HOSPITAL_COMMUNITY): Payer: Medicare Other

## 2020-11-24 DIAGNOSIS — R0603 Acute respiratory distress: Secondary | ICD-10-CM | POA: Diagnosis not present

## 2020-11-24 DIAGNOSIS — I5032 Chronic diastolic (congestive) heart failure: Secondary | ICD-10-CM | POA: Diagnosis not present

## 2020-11-24 DIAGNOSIS — J9621 Acute and chronic respiratory failure with hypoxia: Secondary | ICD-10-CM | POA: Diagnosis not present

## 2020-11-24 DIAGNOSIS — J9601 Acute respiratory failure with hypoxia: Secondary | ICD-10-CM | POA: Diagnosis not present

## 2020-11-24 DIAGNOSIS — A419 Sepsis, unspecified organism: Secondary | ICD-10-CM | POA: Diagnosis not present

## 2020-11-24 LAB — GLUCOSE, CAPILLARY
Glucose-Capillary: 100 mg/dL — ABNORMAL HIGH (ref 70–99)
Glucose-Capillary: 113 mg/dL — ABNORMAL HIGH (ref 70–99)
Glucose-Capillary: 118 mg/dL — ABNORMAL HIGH (ref 70–99)
Glucose-Capillary: 125 mg/dL — ABNORMAL HIGH (ref 70–99)
Glucose-Capillary: 144 mg/dL — ABNORMAL HIGH (ref 70–99)
Glucose-Capillary: 151 mg/dL — ABNORMAL HIGH (ref 70–99)
Glucose-Capillary: 189 mg/dL — ABNORMAL HIGH (ref 70–99)

## 2020-11-24 MED ORDER — FENTANYL CITRATE (PF) 100 MCG/2ML IJ SOLN
100.0000 ug | INTRAMUSCULAR | Status: AC
  Administered 2020-11-24: 100 ug via INTRAVENOUS

## 2020-11-24 MED ORDER — FENTANYL CITRATE (PF) 100 MCG/2ML IJ SOLN
50.0000 ug | INTRAMUSCULAR | Status: DC | PRN
  Filled 2020-11-24: qty 2

## 2020-11-24 MED ORDER — FENTANYL CITRATE (PF) 100 MCG/2ML IJ SOLN
INTRAMUSCULAR | Status: AC
  Filled 2020-11-24: qty 2

## 2020-11-24 MED ORDER — FENTANYL CITRATE (PF) 100 MCG/2ML IJ SOLN
50.0000 ug | INTRAMUSCULAR | Status: DC | PRN
Start: 2020-11-24 — End: 2020-11-24

## 2020-11-24 MED ORDER — FENTANYL CITRATE (PF) 100 MCG/2ML IJ SOLN
100.0000 ug | INTRAMUSCULAR | Status: AC
  Administered 2020-11-24: 100 ug via INTRAVENOUS
  Filled 2020-11-24: qty 2

## 2020-11-24 NOTE — Progress Notes (Signed)
[facility] PCCM  Called emergently to bedside for evaluation of ventilator dyssynchrony, increased work of breathing, and tachycardia. Sonia Drake is well known to the PCCM service for severe COPD, tracheobronchomalacia, and ventilator dependence. She received all bronchodilators this morning, has been suctioned, nothing has come out.  Vitals:   11/24/20 0500 11/24/20 0700 11/24/20 0800 11/24/20 0814  BP:  (!) 84/67 (!) 142/106   Pulse: (!) 131 93 (!) 115   Resp: (!) 24 12 (!) 29   Temp:    98.5 F (36.9 C)  TempSrc:    Oral  SpO2: 96% 97% 96%   Weight:      Height:       Vent Mode: PRVC FiO2 (%):  [40 %] 40 % Set Rate:  [16 bmp] 16 bmp Vt Set:  [400 mL] 400 mL PEEP:  [5 cmH20] 5 cmH20 Plateau Pressure:  [14 cmH20-23 cmH20] 23 cmH20   Intake/Output Summary (Last 24 hours) at 11/24/2020 0836 Last data filed at 11/24/2020 0400 Gross per 24 hour  Intake 720 ml  Output 1100 ml  Net -380 ml    Gen: increased work of breathing, on vent HEENT: NCAT, Tracheostomy in place PULM: poor air movement, slow inspiratory and expiratory time, bilateral breath sounds CV: tachycardic, regular rhythm GI: belly slightly distended MSK: diminished bulk and tone Neuro: will open eyes intermittently   CBC    Component Value Date/Time   WBC 10.1 11/22/2020 0755   RBC 4.02 11/22/2020 0755   HGB 10.7 (L) 11/22/2020 0755   HCT 35.7 (L) 11/22/2020 0755   PLT 237 11/22/2020 0755   MCV 88.8 11/22/2020 0755   MCH 26.6 11/22/2020 0755   MCHC 30.0 11/22/2020 0755   RDW 17.0 (H) 11/22/2020 0755   LYMPHSABS 1.9 11/19/2020 0106   MONOABS 0.7 11/19/2020 0106   EOSABS 0.4 11/19/2020 0106   BASOSABS 0.0 11/19/2020 0106   BMET    Component Value Date/Time   NA 140 11/22/2020 0755   K 4.0 11/22/2020 0755   CL 104 11/22/2020 0755   CO2 30 11/22/2020 0755   GLUCOSE 142 (H) 11/22/2020 0755   BUN 24 (H) 11/22/2020 0755   CREATININE 0.44 11/22/2020 0755   CALCIUM 10.7 (H) 11/22/2020 0755   GFRNONAA >60 11/22/2020  0755   GFRAA >60 08/30/2019 0453    Impression: Acute on chronic respiratory failure with hypercarbia Severe COPD Severe tracheobronchomalaicia  Severe ventilator dyssynchrony   Plan: Stat CXR Fentanyl IV now for ventilator synchrony Continue bronchodilators as ordered Continue vent support as ordered Continue routine tracheostomy care VAP prevention Continue efforts for SNF placement  My cc time 30 minutes  Heber Pierz, MD [place] [facility] Pager: [phone] Cell: [phone] After 7:00 pm call Elink  [phone]

## 2020-11-24 NOTE — Procedures (Signed)
Bedside Bronchoscopy Procedure Note Sonia Drake [id number] 12/09/1955  Procedure: Bronchoscopy Indications: Diagnostic evaluation of the airways  Procedure Details: ET Tube Size: ET Tube secured at lip (cm): Bite block in place: No In preparation for procedure, Patient hyper-oxygenated with 100 % FiO2 Airway entered and the following bronchi were examined: Bronchi.   Bronchoscope removed.  , Patient placed back on 100% FiO2 at conclusion of procedure.    Evaluation BP 103/69   Pulse (!) 110   Temp 98.2 F (36.8 C) (Axillary)   Resp 14   Ht 5\' 2"  (1.575 m)   Wt 88.7 kg   SpO2 98%   BMI 35.77 kg/m  Breath Sounds:Clear and Diminished O2 sats: stable throughout Patient's Current Condition: stable Specimens:  None Complications: No apparent complications Patient did tolerate procedure well.   [name] 11/24/2020, 4:12 PM

## 2020-11-24 NOTE — Progress Notes (Signed)
RT responded to ventilator alarm.  Patient was alarming at volume restricted.  Patient also noted to have prolonged expiratory phase and increased WOB.  Suctioned patient and was able to obtain a moderate amount of thick, tan/pink, plugs.  With assistance of RN, bag lavaged patient and was able to obtain a small, tan/yellow, plug.  Patient placed back on ventilator.  Patient given PRN Albuterol.  Will inform MD.

## 2020-11-24 NOTE — Progress Notes (Signed)
PROGRESS NOTE    Sonia Drake  TRR:[id number] DOB: 1955/07/10 DOA: 09/25/2020 PCP: Garwin Brothers, MD    Brief Narrative:  Sonia Drake is a 65 year old female with past medical history significant for cardiac arrest 8/2021with subsequent anoxic brain injury s/p PEG tube and trach, DM2, history of chronic diastolic congestive heart failure who presented to [facility] ED on 6/12 with worsening respiratory failure from SNF.  Patient previously was ventilator dependent but had been weaned from her trach.  Patient was admitted to the PCCM service underwent bronchoscopy on 10/05/2020 which revealed dynamic collapse of her trachea and subsequently her tracheostomy was replaced.  Remains dependent on ventilator support and poorly interactive.  Patient was seen by palliative care on 10/13/2020 with plans of no de-escalation of care per POA.  Patient's care was transferred to [facility] on 11/21/2020.   Assessment & Plan:   Principal Problem:   Sepsis ([place]) Active Problems:   COPD (chronic obstructive pulmonary disease) (HCC)   Chronic diastolic CHF (congestive heart failure) (HCC)   Acute on chronic respiratory failure with hypoxia (HCC)   GERD (gastroesophageal reflux disease)   Seizures (HCC)   Type 2 diabetes mellitus without complication, with long-term current use of insulin (HCC)   Severe hypoxic-ischemic encephalopathy   Tracheostomy dependence ([place])   Pressure injury of skin   Acute on chronic hypoxic respiratory failure Sepsis: Ruled out Etiology likely secondary to anoxic brain injury with dynamic airway collapse noted on bronchoscopy 10/05/2020.  Continues to remain vent dependent.  Initially concern for aspiration pneumonia/bacteremia, evaluated by infectious disease who suggests infection less likely with reassuring procalcitonin.  Patient was initially started on IV antibiotics which were discontinued. --PCCM following, appreciate assistance --Chest x-ray 8/5 with mild scarring/atelectasis no acute  finding. --Singulair 10 mg per tube daily --Budesonide neb twice daily --Yupelri neb daily --Brovana neb twice daily --Albuterol neb every 2 hours as needed wheezing/shortness of breath --Continue vent support with routine tracheostomy care  Hx cardiac arrest 11/2019 with resultant severe anoxic brain injury Patient remains in a vegetative state, bedbound and nonverbal at baseline.  Status post tracheostomy and PEG tube.  Seen by neurology who suggested patient does not appear to have meaningful recovery.  Patient was evaluated by palliative care and after further discussion with family plans for not to de-escalate any level of care at this point.  Seizure disorder in the setting of anoxic brain injury --Clonazepam 0.67m per tube BID --Keppra 2500 mg per tube twice daily --Valproic acid 1000 mg per tube every 12 hours --Lorazepam 2-4 mg IV every hours as needed seizure  Insulin-dependent diabetes mellitus Hemoglobin A1c 7.0, well controlled. --Lantus 36 units subcutaneously daily --Moderate SSI for further coverage --CBGs every 4 hours  Hx CVA --Plavix 75 mg per tube daily  Chronic diastolic congestive heart failure, compensated --On 40% FiO2 with full vent support as above  Anemia of chronic medical disease Hemoglobin stable, 10.7 on 11/22/2020. --CBC weekly   DVT prophylaxis:   Lovenox   Code Status: Full Code Family Communication: No family present at bedside this morning  Disposition Plan:  Level of care: ICU Status is: Inpatient  Remains inpatient appropriate because:Unsafe d/c plan and Inpatient level of care appropriate due to severity of illness  Dispo: The patient is from: SNF              Anticipated d/c is to: SNF              Patient currently is medically stable to  d/c.   Difficult to place patient Yes   Consultants:  PCCM Neurology Palliative care Infectious disease, Dr. Linus Salmons  Procedures:  EEG 6/26 - 6/28  Antimicrobials:  Vancomycin 6/12 -  6/15, 6/18 - 6/20 Cefepime 6/12 - 6/15, 6/18 - 6/18 Metronidazole 6/12 - 6/15   Subjective: Patient seen examined at bedside, resting comfortably.  Remains on full vent support.  Early this morning, patient with increased work of breathing and noted ventilator dyssynchrony with associated tachycardia.  Patient received IV fentanyl 100 mcg with improvement as well as tracheostomy care with suction with small tan mucous plug removed.  Patient now appears to be more comfortable on vent.  Remains minimally responsive, will open eyes occasionally not specifically to command.  No family present at bedside.  Overall poor prognosis with unlikely meaningful recovery per multiple specialty notes including neurology.  Awaiting for SNF placement; which is likely difficult given her requirement of full vent support.  Objective: Vitals:   11/24/20 1030 11/24/20 1144 11/24/20 1200 11/24/20 1320  BP: 93/78  (!) 108/42   Pulse: (!) 112  (!) 104   Resp: 12  15   Temp:  98.2 F (36.8 C)    TempSrc:  Axillary    SpO2: 98%  99% 100%  Weight:      Height:        Intake/Output Summary (Last 24 hours) at 11/24/2020 1335 Last data filed at 11/24/2020 1200 Gross per 24 hour  Intake 890 ml  Output 300 ml  Net 590 ml   Filed Weights   11/22/20 0428 11/23/20 0500 11/24/20 0457  Weight: 92.1 kg 93.4 kg 88.7 kg    Examination:  General exam: Chronically ill in appearance, cachectic, appears older than stated age, on full vent support Respiratory system: Clear to auscultation bilaterally, no crackles/wheezing, on vent support with FiO2 40% Cardiovascular system: S1 & S2 heard, RRR. No JVD, murmurs, rubs, gallops or clicks. No pedal edema. Gastrointestinal system: Abdomen is nondistended, soft and nontender. No organomegaly or masses felt. Normal bowel sounds heard.  PEG tube noted with tube feeds infusing Central nervous system: Alert, opens eyes but not following commands Extremities: No peripheral  edema Skin: No rashes, lesions or ulcers Psychiatry: Unable to assess due to mental status    Data Reviewed: I have personally reviewed following labs and imaging studies  CBC: Recent Labs  Lab 11/19/20 0106 11/20/20 1253 11/22/20 0755  WBC 7.9  --  10.1  NEUTROABS 4.7  --   --   HGB 9.8* 11.2* 10.7*  HCT 31.9* 33.0* 35.7*  MCV 88.4  --  88.8  PLT 212  --  275   Basic Metabolic Panel: Recent Labs  Lab 11/19/20 0106 11/20/20 1253 11/22/20 0755  NA 142 145 140  K 4.1 4.2 4.0  CL 107  --  104  CO2 28  --  30  GLUCOSE 135*  --  142*  BUN 27*  --  24*  CREATININE 0.47  --  0.44  CALCIUM 10.4*  --  10.7*   GFR: Estimated Creatinine Clearance: 71.5 mL/min (by C-G formula based on SCr of 0.44 mg/dL). Liver Function Tests: No results for input(s): AST, ALT, ALKPHOS, BILITOT, PROT, ALBUMIN in the last 168 hours. No results for input(s): LIPASE, AMYLASE in the last 168 hours. No results for input(s): AMMONIA in the last 168 hours. Coagulation Profile: Recent Labs  Lab 11/19/20 0106  INR 1.1   Cardiac Enzymes: No results for input(s): CKTOTAL, CKMB, CKMBINDEX, TROPONINI  in the last 168 hours. BNP (last 3 results) No results for input(s): PROBNP in the last 8760 hours. HbA1C: No results for input(s): HGBA1C in the last 72 hours. CBG: Recent Labs  Lab 11/23/20 1952 11/24/20 0008 11/24/20 0418 11/24/20 0812 11/24/20 1142  GLUCAP 149* 151* 113* 189* 100*   Lipid Profile: No results for input(s): CHOL, HDL, LDLCALC, TRIG, CHOLHDL, LDLDIRECT in the last 72 hours. Thyroid Function Tests: No results for input(s): TSH, T4TOTAL, FREET4, T3FREE, THYROIDAB in the last 72 hours. Anemia Panel: No results for input(s): VITAMINB12, FOLATE, FERRITIN, TIBC, IRON, RETICCTPCT in the last 72 hours. Sepsis Labs: No results for input(s): PROCALCITON, LATICACIDVEN in the last 168 hours.  No results found for this or any previous visit (from the past 240 hour(s)).        Radiology Studies: DG CHEST PORT 1 VIEW  Result Date: 11/24/2020 CLINICAL DATA:  Shortness of breath EXAM: PORTABLE CHEST 1 VIEW COMPARISON:  11/19/2020 FINDINGS: Linear atelectasis or scarring behind the heart. There is no edema, consolidation, effusion, or pneumothorax. Normal heart size and mediastinal contours. Tracheostomy tube in place. IMPRESSION: Mild scarring or atelectasis.  No acute finding. Electronically Signed   By: Monte Fantasia M.D.   On: 11/24/2020 09:12        Scheduled Meds:  arformoterol  15 mcg Nebulization BID   baclofen  5 mg Per Tube TID   budesonide  0.5 mg Nebulization BID   chlorhexidine gluconate (MEDLINE KIT)  15 mL Mouth Rinse BID   Chlorhexidine Gluconate Cloth  6 each Topical Daily   clonazePAM  0.5 mg Per Tube BID   clopidogrel  75 mg Per Tube Daily   enoxaparin (LOVENOX) injection  0.5 mg/kg Subcutaneous Q24H   famotidine  20 mg Per Tube BID   feeding supplement (PROSource TF)  45 mL Per Tube BID   fiber  1 packet Per Tube BID   Gerhardt's butt cream   Topical BID   guaiFENesin  5 mL Per Tube Q6H   hydrocortisone cream   Topical TID   insulin aspart  0-15 Units Subcutaneous Q4H   insulin glargine  36 Units Subcutaneous QHS   levETIRAcetam  2,500 mg Per Tube BID   loratadine  10 mg Per Tube Daily   mouth rinse  15 mL Mouth Rinse 10 times per day   montelukast  10 mg Per Tube Daily   nutrition supplement (JUVEN)  1 packet Per Tube BID   revefenacin  175 mcg Nebulization Daily   tiZANidine  2 mg Per Tube QHS   valproic acid  1,000 mg Per Tube Q12H   Continuous Infusions:  sodium chloride     feeding supplement (JEVITY 1.5 CAL/FIBER) 1,000 mL (11/24/20 0458)     LOS: 54 days    Time spent: 46 minutes spent on chart review, discussion with nursing staff, consultants, updating family and interview/physical exam; more than 50% of that time was spent in counseling and/or coordination of care.    Meher Kucinski J [place] DO [facility] Available via Epic secure chat 7am-7pm After these hours, please refer to coverage provider listed on [email or web address] 11/24/2020, 1:35 PM

## 2020-11-24 NOTE — Procedures (Signed)
Bronchoscopy Procedure Note  Sonia Drake  [id number]  08-29-1955  Date:11/24/20  Time:4:05 PM   Provider Performing:Brent Cordale Manera   Procedure(s):  Flexible Bronchoscopy [phone])  Indication(s) Concern for airway obstruction  Consent Risks of the procedure as well as the alternatives and risks of each were explained to the patient and/or caregiver.  Consent for the procedure was obtained and is signed in the bedside chart  Anesthesia Fentanyl 157mg IV   Time Out Verified patient identification, verified procedure, site/side was marked, verified correct patient position, special equipment/implants available, medications/allergies/relevant history reviewed, required imaging and test results available.  I called the patient's daughter Sonia Memsimmediately prior to the procedure and explained the indication (concern for airway obstruction) and the risks. She gave verbal consent by phone.   Sterile Technique Usual hand hygiene, masks, gowns, and gloves were used   Procedure Description Bronchoscope advanced through tracheostomy tube and into airway.  Airways were examined down to subsegmental level with findings noted below.     Findings: The end of the tracheostomy was well positioned: 2cm proximal to the carina.  We noted that the posterior tracheal wall could easily occlude the tracheostomy tube with coughing.  This was due to her severe tracheobronchomalacia. She had severe bronchomalacia in both lungs, with complete dynamic collapse of the right mainstem bronchus.    Complications/Tolerance None; patient tolerated the procedure well. Chest X-ray is needed post procedure.   EBL Minimal   Specimen(s) none  BRoselie Awkward MD [place] [facility]M Pager: [phone]: [phone] 7:00 pm call Elink  [phone]

## 2020-11-24 NOTE — Progress Notes (Addendum)
eLink Physician-Brief Progress Note Patient Name: MONITA SWIER DOB: 12/24/1955 MRN: [id number]   Date of Service  11/24/2020  HPI/Events of Note  Notified of vent asynchrony, responding to fentanyl bolus earlier in the day.  eICU Interventions  Fentanyl IV prn ordered.     Intervention Category Intermediate Interventions: Other:  Larinda Buttery 11/24/2020, 11:02 PM  12:45 AM Map 50-60 as per RN.  On camera assessment, BP 91/75, HR 78, RR 14, O2 sats 99%.  Plan> Give 500cc LR bolus.

## 2020-11-25 ENCOUNTER — Inpatient Hospital Stay (HOSPITAL_COMMUNITY): Payer: Medicare Other

## 2020-11-25 DIAGNOSIS — A419 Sepsis, unspecified organism: Secondary | ICD-10-CM | POA: Diagnosis not present

## 2020-11-25 DIAGNOSIS — J9621 Acute and chronic respiratory failure with hypoxia: Secondary | ICD-10-CM | POA: Diagnosis not present

## 2020-11-25 DIAGNOSIS — R0603 Acute respiratory distress: Secondary | ICD-10-CM | POA: Diagnosis not present

## 2020-11-25 LAB — GLUCOSE, CAPILLARY
Glucose-Capillary: 110 mg/dL — ABNORMAL HIGH (ref 70–99)
Glucose-Capillary: 124 mg/dL — ABNORMAL HIGH (ref 70–99)
Glucose-Capillary: 130 mg/dL — ABNORMAL HIGH (ref 70–99)
Glucose-Capillary: 150 mg/dL — ABNORMAL HIGH (ref 70–99)
Glucose-Capillary: 204 mg/dL — ABNORMAL HIGH (ref 70–99)
Glucose-Capillary: 242 mg/dL — ABNORMAL HIGH (ref 70–99)

## 2020-11-25 MED ORDER — LACTATED RINGERS IV BOLUS
500.0000 mL | Freq: Once | INTRAVENOUS | Status: AC
  Administered 2020-11-25: 500 mL via INTRAVENOUS

## 2020-11-25 MED ORDER — FENTANYL CITRATE (PF) 100 MCG/2ML IJ SOLN
100.0000 ug | INTRAMUSCULAR | Status: AC
  Administered 2020-11-25: 100 ug via INTRAVENOUS

## 2020-11-25 MED ORDER — FENTANYL CITRATE (PF) 100 MCG/2ML IJ SOLN
50.0000 ug | INTRAMUSCULAR | Status: DC | PRN
  Administered 2020-11-27 – 2020-11-29 (×3): 100 ug via INTRAVENOUS
  Filled 2020-11-25 (×3): qty 2

## 2020-11-25 MED ORDER — METHYLPREDNISOLONE SODIUM SUCC 40 MG IJ SOLR
40.0000 mg | Freq: Two times a day (BID) | INTRAMUSCULAR | Status: AC
  Administered 2020-11-25 – 2020-11-26 (×4): 40 mg via INTRAVENOUS
  Filled 2020-11-25 (×4): qty 1

## 2020-11-25 NOTE — Progress Notes (Signed)
Called to patient's bedside due to peak airway pressure alarms.  Patient not ventilating well, decrease MVe, VT, and increased PIP.  RT lavaged patient with a small amount of secretions obtained, but pressures and limited VT remained. RT manually bagged the patient with an ambu bag.  CCM MD called to the bedside and sedation ordered.  Even after sedation given VT's remained low.  CCM bronched the patient, suctioned the patient, and advanced the trach.  VT's, MVe, and PIP resolved to normal limits.  RT will continue to monitor.

## 2020-11-25 NOTE — Progress Notes (Signed)
NAME:  Sonia Drake, MRN:  [id number], DOB:  1955-07-04, LOS: 55 ADMISSION DATE:  09/25/2020, CONSULTATION DATE:  6/15 REFERRING MD:  Thedore Mins, CHIEF COMPLAINT:  Dyspnea   History of Present Illness:  65 y/o female with anoxic brain injury, tracheostomy status who was admitted from a SNF on 6/12 with worsening respiratory failure.  She had previously been ventilator dependent but had been weaned from the trach.  On 6/16 a bronchoscopy revealed dynamic collapse of her trachea, tracheostomy was replaced.  Later required mechanical ventilation.  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 No distress. WBC ct trending down. No events overnight growing GPC clusters in two of two cultures  6/16 increased work of breathing, upper airway noise, abdominal muscle use.  Bronchoscopy performed as trach exchange cuffless #8 Shiley XLT showed dynamic airway collapse, tracheal collapse. 6/16 continued respiratory distress moved to ICU, changed to cuffed #6 Shiley XLT, placed on MV with some improved comfort 6/12 cefepime > 6/14, 6/18 6/12 vanc > 6/14, 6/18 6/23 mucus plugging, severe vent dyssynchrony 6/24-25 intermittent jerking, seizures on EEG 6/27 weaned off propofol gtt 6/28 briefly tolerated trach collar in AM 6/29 tolerated trach collar in AM; no further seizure like activity; working with CM/SW for vent-SNF 7/1 Weaning on CPAP/ PS 10/5, CM SW working on [employer], NO seizure activity, secretions are not an issue per  nursing 7/11 trach changed to #8 Bivona with air cuff, much improved. 7/12 TCT 12 hours; rested on Vent overnight 7/13 on TCT 7/23 Trach collar tolerated 7/24 Full vent support 7/26>> Remains on full vent support this am, no issues with secretions per nursing 8/5 occluding trach with dynamic collapse: seen on bronchoscopy 8/6 called back emergently to bedside for tracheostomy occlusion: emergent bronch, bivona trach migrated back 2cm, improved with advancing to 2cm above carina  Interim History / Subjective:  Called back emergently to bedside for high peak pressure alarms, no air movement Emergent bronch: bivona trach migrated back 2cm, improved with advancing to 2cm above carina  Objective   Blood pressure 114/83, pulse 90, temperature 99.8 F (37.7 C), temperature source Axillary, resp. rate 20, height 5\' 2"  (1.575 m), weight 88.3 kg, SpO2 99 %.    Vent Mode: PRVC FiO2 (%):  [40 %] 40 % Set Rate:  [16 bmp] 16 bmp Vt Set:  [400 mL] 400 mL PEEP:  [5 cmH20] 5 cmH20 Plateau Pressure:  [18 cmH20-20 cmH20] 18 cmH20   Intake/Output Summary (Last 24 hours) at 11/25/2020 1037 Last data filed at 11/25/2020 1000 Gross per 24 hour  Intake 1490.24 ml  Output 301 ml  Net 1189.24 ml   Filed Weights   11/23/20 0500 11/24/20 0457 11/25/20 0104  Weight: 93.4 kg 88.7 kg 88.3 kg    Examination: General:  In bed on vent in respiratory distress HENT: NCAT tracheostomy in place PULM: no air movement,  CV: RRR, no mgr GI: BS+, soft, nontender MSK: normal bulk and tone Neuro: eyes open, doesn't follow commands   Resolved Hospital Problem list     Assessment & Plan:  Acute on chronic respiratory failure with hypercarbia Severe  COPD, recurrent exacerbation Severe tracheobronchomalacia > causes occlusion of tracheostomy with dynamic collapse of airways Severe ventilator dyssynchrony Anoxic brain injury Tracheostomy status    Plan: Reposition tracheostomy emergently using bronchoscopy  for guidance Unclear why bivona migrated overnight, we re-secured today at 14cm positioning which puts the end of the tracheostomy tube 2 cm above her carina; if tube migration occurs again will replace the bivona vs place distal XLT Stat CXR Fentanyl 50-122mcg IV q2h prn vent synchrony Add back solumedrol Increase PEEP> hopefully she will be more compliant as this will stent open her airway and prevent occlusion from dynamic collapse Continue bronchodilators  Need to address goals of care with family again as it seems to be of little medical benefit to continue providing very difficult, high level mechancial ventilatory support to someone who has made no neurologic progress from anoxic brain injury.    Best Practice (right click and "Reselect all SmartList Selections" daily)   Diet/type: tubefeeds DVT prophylaxis: LMWH GI prophylaxis: H2B Lines: N/A Foley:  N/A Code Status:  full code Last date of multidisciplinary goals of care discussion [per TRH]  Labs   CBC: Recent Labs  Lab 11/19/20 0106 11/20/20 1253 11/22/20 0755  WBC 7.9  --  10.1  NEUTROABS 4.7  --   --   HGB 9.8* 11.2* 10.7*  HCT 31.9* 33.0* 35.7*  MCV 88.4  --  88.8  PLT 212  --  237    Basic Metabolic Panel: Recent Labs  Lab 11/19/20 0106 11/20/20 1253 11/22/20 0755  NA 142 145 140  K 4.1 4.2 4.0  CL 107  --  104  CO2 28  --  30  GLUCOSE 135*  --  142*  BUN 27*  --  24*  CREATININE 0.47  --  0.44  CALCIUM 10.4*  --  10.7*   GFR: Estimated Creatinine Clearance: 71.4 mL/min (by C-G formula based on SCr of 0.44 mg/dL). Recent Labs  Lab 11/19/20 0106 11/22/20 0755  WBC 7.9 10.1    Liver Function Tests: No results for input(s): AST, ALT, ALKPHOS, BILITOT, PROT, ALBUMIN in the last 168 hours. No results for input(s): LIPASE, AMYLASE in the last 168 hours. No results for input(s): AMMONIA in the last 168 hours.  ABG    Component Value Date/Time   PHART 7.414 11/20/2020 1253   PCO2ART 52.3  (H) 11/20/2020 1253   PO2ART 127 (H) 11/20/2020 1253   HCO3 33.6 (H) 11/20/2020 1253   TCO2 35 (H) 11/20/2020 1253   O2SAT 99.0 11/20/2020 1253     Coagulation Profile: Recent Labs  Lab 11/19/20 0106  INR 1.1    Cardiac Enzymes: No results for input(s): CKTOTAL, CKMB, CKMBINDEX, TROPONINI in the last 168 hours.  HbA1C: Hgb A1c MFr Bld  Date/Time Value Ref Range Status  October 25, 2020 06:31 PM 7.0 (H) 4.8 - 5.6 % Final    Comment:    (NOTE)         Prediabetes: 5.7 - 6.4         Diabetes: >6.4         Glycemic control for adults with diabetes: <7.0   08/30/2019 04:53 AM 6.5 (H) 4.8 - 5.6 % Final    Comment:    (NOTE) Pre diabetes:          5.7%-6.4% Diabetes:              >6.4% Glycemic control for   <7.0% adults with diabetes     CBG: Recent Labs  Lab  11/24/20 1604 11/24/20 1941 11/24/20 2326 11/25/20 0342 11/25/20 0741  GLUCAP 118* 144* 125* 124* 130*    Critical care time: 35 minutes    Heber La Cyg[place], MD [place] [facility] Pager: [phone] Cell: [phone] After 7:00 pm call Elink  [phone]

## 2020-11-25 NOTE — Progress Notes (Signed)
PROGRESS NOTE    Sonia Drake  XTK:[id number] DOB: 1955/12/06 DOA: 10/18/2020 PCP: Garwin Brothers, MD    Brief Narrative:  Sonia Drake is a 65 year old female with past medical history significant for cardiac arrest 8/2021with subsequent anoxic brain injury s/p PEG tube and trach, DM2, history of chronic diastolic congestive heart failure who presented to [facility] ED on 6/12 with worsening respiratory failure from SNF.  Patient previously was ventilator dependent but had been weaned from her trach.  Patient was admitted to the PCCM service underwent bronchoscopy on 10/05/2020 which revealed dynamic collapse of her trachea and subsequently her tracheostomy was replaced.  Remains dependent on ventilator support and poorly interactive.  Patient was seen by palliative care on 10/13/2020 with plans of no de-escalation of care per POA.  Patient's care was transferred to [facility] on 11/21/2020.   Assessment & Plan:   Principal Problem:   Sepsis ([place]) Active Problems:   COPD (chronic obstructive pulmonary disease) (HCC)   Chronic diastolic CHF (congestive heart failure) (HCC)   Acute on chronic respiratory failure with hypoxia (HCC)   GERD (gastroesophageal reflux disease)   Seizures (HCC)   Type 2 diabetes mellitus without complication, with long-term current use of insulin (HCC)   Severe hypoxic-ischemic encephalopathy   Tracheostomy dependence ([place])   Pressure injury of skin   Acute on chronic hypoxic respiratory failure Sepsis: Ruled out Etiology likely secondary to anoxic brain injury with dynamic airway collapse noted on bronchoscopy 10/05/2020.  Continues to remain vent dependent.  Initially concern for aspiration pneumonia/bacteremia, evaluated by infectious disease who suggests infection less likely with reassuring procalcitonin.  Patient was initially started on IV antibiotics which were discontinued.  Bronchoscopy on 11/24/2020 showed tracheal/bronchial malacia with complete dynamic collapse of the  right mainstem bronchus was observed.  Repeat bronchoscopy 8/6 for airway occlusion with findings of migrated tracheostomy which was repositioned with resolution of airway occlusion. 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  Status post tracheostomy and PEG tube.  Seen by neurology who suggested patient does not appear to have meaningful recovery.  Patient was evaluated by palliative care and after further discussion with family plans for not to de-escalate any level of care at this point.  Seizure disorder in the setting of anoxic brain injury --Clonazepam 0.84m per tube BID --Keppra 2500 mg per tube twice daily --Valproic acid 1000 mg per tube every 12 hours --Lorazepam 2-4 mg IV every hours as needed seizure  Insulin-dependent diabetes mellitus Hemoglobin A1c 7.0, well controlled. --Lantus 36 units subcutaneously daily --Moderate SSI for further coverage --CBGs every 4 hours  Hx CVA --Plavix 75 mg per tube daily  Chronic diastolic congestive heart failure, compensated --On 40% FiO2 with full vent support as above  Anemia of chronic medical disease Hemoglobin stable, 10.7 on 11/22/2020. --CBC weekly   DVT prophylaxis:   Lovenox   Code Status: Full Code Family Communication: No family present at bedside this morning  Disposition Plan:  Level of care: ICU Status is: Inpatient  Remains inpatient appropriate because:Unsafe d/c plan and Inpatient level of care appropriate due to severity of illness  Dispo: The patient is from: SNF  Anticipated d/c is to: SNF              Patient currently is  medically stable to d/c.   Difficult to place patient Yes   Consultants:  PCCM Neurology Palliative care Infectious disease, Dr. Linus Salmons  Procedures:  EEG 6/26 - 6/28 Bronchoscopy on 11/24/2020 showed tracheal/bronchial malacia with complete dynamic collapse of the right mainstem bronchus was observed.   Bronchoscopy 8/6 for airway occlusion with findings of migrated tracheostomy which was repositioned with resolution of airway occlusion.  Antimicrobials:  Vancomycin 6/12 - 6/15, 6/18 - 6/20 Cefepime 6/12 - 6/15, 6/18 - 6/18 Metronidazole 6/12 - 6/15   Subjective: Patient seen examined at bedside, resting comfortably.  Remains on full vent support.  Wentz bronchoscopy yesterday with findings of tracheal/bronchial malacia with complete dynamic collapse.  Repeat bronchoscopy this morning for airway occlusions with Viagra and some migrated tracheostomy which was repositioned with resolution of airway occlusion.  No family present at bedside.  Continues to require as needed IV fentanyl for ventilator dyssynchrony.  Remains minimally responsive, will open eyes occasionally but not specifically to command.  Continues with overall poor/grim prognosis with unlikely meaningful recovery due to her prior cardiac arrest with anoxic brain injury, and this was agreed on by multiple specialists to include neurology and PCCM.  Difficult SNF placement due to ventilator dependent.  No other acute concerns per nursing staff this morning.  Objective: Vitals:   11/25/20 0900 11/25/20 0930 11/25/20 1000 11/25/20 1030  BP: 105/71 94/67 127/86 (!) 167/144  Pulse: 88 80 90 (!) 108  Resp: _0 (!) 22  Temp:      TempSrc:      SpO2: 98% 99% 100% 94%  Weight:      Height:        Intake/Output Summary (Last 24 hours) at 11/25/2020 1128 Last data filed at 11/25/2020 1000 Gross per 24 hour  Intake 1535.24 ml  Output 301 ml  Net 1234.24 ml   Filed Weights   11/23/20 0500 11/24/20 0457 11/25/20 0104   Weight: 93.4 kg 88.7 kg 88.3 kg    Examination:  General exam: Chronically ill in appearance, cachectic, appears older than stated age, on full vent support Respiratory system: Clear to auscultation bilaterally, no crackles/wheezing, on vent support with FiO2 40% Cardiovascular system: S1 & S2 heard, RRR. No JVD, murmurs, rubs, gallops or clicks. No pedal edema. Gastrointestinal system: Abdomen is nondistended, soft and nontender. No organomegaly or masses felt. Normal bowel sounds heard.  PEG tube noted with tube feeds infusing Central nervous system: Alert, opens eyes but not following commands Extremities: No peripheral edema Skin: No rashes, lesions or ulcers Psychiatry: Unable to assess due to mental status    Data Reviewed: I have personally reviewed following labs and imaging studies  CBC: Recent Labs  Lab 11/19/20 0106 11/20/20 1253 11/22/20 0755  WBC 7.9  --  10.1  NEUTROABS 4.7  --   --   HGB 9.8* 11.2* 10.7*  HCT 31.9* 33.0* 35.7*  MCV 88.4  --  88.8  PLT 212  --  300   Basic Metabolic Panel: Recent Labs  Lab 11/19/20 0106 11/20/20 1253 11/22/20 0755  NA 142 145 140  K 4.1 4.2 4.0  CL 107  --  104  CO2 28  --  30  GLUCOSE 135*  --  142*  BUN 27*  --  24*  CREATININE 0.47  --  0.44  CALCIUM 10.4*  --  10.7*  GFR: Estimated Creatinine Clearance: 71.4 mL/min (by C-G formula based on SCr of 0.44 mg/dL). Liver Function Tests: No results for input(s): AST, ALT, ALKPHOS, BILITOT, PROT, ALBUMIN in the last 168 hours. No results for input(s): LIPASE, AMYLASE in the last 168 hours. No results for input(s): AMMONIA in the last 168 hours. Coagulation Profile: Recent Labs  Lab 11/19/20 0106  INR 1.1   Cardiac Enzymes: No results for input(s): CKTOTAL, CKMB, CKMBINDEX, TROPONINI in the last 168 hours. BNP (last 3 results) No results for input(s): PROBNP in the last 8760 hours. HbA1C: No results for input(s): HGBA1C in the last 72 hours. CBG: Recent  Labs  Lab 11/24/20 1604 11/24/20 1941 11/24/20 2326 11/25/20 0342 11/25/20 0741  GLUCAP 118* 144* 125* 124* 130*   Lipid Profile: No results for input(s): CHOL, HDL, LDLCALC, TRIG, CHOLHDL, LDLDIRECT in the last 72 hours. Thyroid Function Tests: No results for input(s): TSH, T4TOTAL, FREET4, T3FREE, THYROIDAB in the last 72 hours. Anemia Panel: No results for input(s): VITAMINB12, FOLATE, FERRITIN, TIBC, IRON, RETICCTPCT in the last 72 hours. Sepsis Labs: No results for input(s): PROCALCITON, LATICACIDVEN in the last 168 hours.  No results found for this or any previous visit (from the past 240 hour(s)).       Radiology Studies: DG CHEST PORT 1 VIEW  Result Date: 11/24/2020 CLINICAL DATA:  Shortness of breath EXAM: PORTABLE CHEST 1 VIEW COMPARISON:  11/19/2020 FINDINGS: Linear atelectasis or scarring behind the heart. There is no edema, consolidation, effusion, or pneumothorax. Normal heart size and mediastinal contours. Tracheostomy tube in place. IMPRESSION: Mild scarring or atelectasis.  No acute finding. Electronically Signed   By: Monte Fantasia M.D.   On: 11/24/2020 09:12        Scheduled Meds:  arformoterol  15 mcg Nebulization BID   baclofen  5 mg Per Tube TID   budesonide  0.5 mg Nebulization BID   chlorhexidine gluconate (MEDLINE KIT)  15 mL Mouth Rinse BID   Chlorhexidine Gluconate Cloth  6 each Topical Daily   clonazePAM  0.5 mg Per Tube BID   clopidogrel  75 mg Per Tube Daily   enoxaparin (LOVENOX) injection  0.5 mg/kg Subcutaneous Q24H   famotidine  20 mg Per Tube BID   feeding supplement (PROSource TF)  45 mL Per Tube BID   fentaNYL (SUBLIMAZE) injection  100 mcg Intravenous STAT   fiber  1 packet Per Tube BID   Gerhardt's butt cream   Topical BID   guaiFENesin  5 mL Per Tube Q6H   hydrocortisone cream   Topical TID   insulin aspart  0-15 Units Subcutaneous Q4H   insulin glargine  36 Units Subcutaneous QHS   levETIRAcetam  2,500 mg Per Tube BID    loratadine  10 mg Per Tube Daily   mouth rinse  15 mL Mouth Rinse 10 times per day   methylPREDNISolone (SOLU-MEDROL) injection  40 mg Intravenous BID   montelukast  10 mg Per Tube Daily   nutrition supplement (JUVEN)  1 packet Per Tube BID   revefenacin  175 mcg Nebulization Daily   tiZANidine  2 mg Per Tube QHS   valproic acid  1,000 mg Per Tube Q12H   Continuous Infusions:  sodium chloride     feeding supplement (JEVITY 1.5 CAL/FIBER) 1,000 mL (11/24/20 0458)     LOS: 55 days    Time spent: 46 minutes spent on chart review, discussion with nursing staff, consultants, updating family and interview/physical exam; more than 50% of that  time was spent in counseling and/or coordination of care.    Eric J [place] DO [facility] Available via Epic secure chat 7am-7pm After these hours, please refer to coverage provider listed on [email or web address] 11/25/2020, 11:28 AM

## 2020-11-25 NOTE — Procedures (Signed)
Bronchoscopy Procedure Note  Sonia Drake  [id number]  [birth year]  Date:11/25/20  Time:11:14 AM   Provider Performing:Brent Johntavius Shepard   Procedure(s):  Flexible Bronchoscopy [phone])  Indication(s) Airway occlusion, acute respiratory failure with hypoxemia  Consent Unable to obtain consent due to emergent nature of procedure.  Anesthesia Fentanyl 115mg IV   Time Out Verified patient identification, verified procedure, site/side was marked, verified correct patient position, special equipment/implants available, medications/allergies/relevant history reviewed, required imaging and test results available.   Sterile Technique Usual hand hygiene, masks, gowns, and gloves were used   Procedure Description Bronchoscope advanced through tracheostomy tube and into airway.  Airways were examined down to subsegmental level with findings noted below.   Following the exam we repositioned the tracheostomy tube  Findings: The bivona tracheostomy had migrated from it's position yesterday to a place where it was now 5-6 cm above the carina and the orifice was nearly completely occluded by her trachea which is swollen and collapses nearly completely from the tracheomalacia.  I suctioned mucus and used the bronchoscope to guide replacement of the tracheostomy back to 2 cm above the carina.     Complications/Tolerance None; patient tolerated the procedure well. Chest X-ray is not needed post procedure.   EBL Minimal   Specimen(s) None  BRoselie Awkward MD [place] [facility] Pager: [phone]: [phone] 7:00 pm call Elink  [phone]

## 2020-11-26 DIAGNOSIS — I5032 Chronic diastolic (congestive) heart failure: Secondary | ICD-10-CM | POA: Diagnosis not present

## 2020-11-26 DIAGNOSIS — J9621 Acute and chronic respiratory failure with hypoxia: Secondary | ICD-10-CM | POA: Diagnosis not present

## 2020-11-26 DIAGNOSIS — J9601 Acute respiratory failure with hypoxia: Secondary | ICD-10-CM | POA: Diagnosis not present

## 2020-11-26 DIAGNOSIS — A419 Sepsis, unspecified organism: Secondary | ICD-10-CM | POA: Diagnosis not present

## 2020-11-26 DIAGNOSIS — R0603 Acute respiratory distress: Secondary | ICD-10-CM | POA: Diagnosis not present

## 2020-11-26 LAB — GLUCOSE, CAPILLARY
Glucose-Capillary: 261 mg/dL — ABNORMAL HIGH (ref 70–99)
Glucose-Capillary: 219 mg/dL — ABNORMAL HIGH (ref 70–99)
Glucose-Capillary: 149 mg/dL — ABNORMAL HIGH (ref 70–99)
Glucose-Capillary: 238 mg/dL — ABNORMAL HIGH (ref 70–99)
Glucose-Capillary: 216 mg/dL — ABNORMAL HIGH (ref 70–99)
Glucose-Capillary: 165 mg/dL — ABNORMAL HIGH (ref 70–99)

## 2020-11-26 MED ORDER — PREDNISONE 20 MG PO TABS
40.0000 mg | ORAL_TABLET | Freq: Every day | ORAL | Status: AC
  Administered 2020-11-27 – 2020-11-30 (×4): 40 mg
  Filled 2020-11-26 (×4): qty 2

## 2020-11-26 NOTE — Progress Notes (Signed)
PROGRESS NOTE    Sonia Drake  WUJ:[id number] DOB: 23-Oct-1955 DOA: 10/19/2020 PCP: Garwin Brothers, MD    Brief Narrative:  Sonia Drake is a 65 year old female with past medical history significant for cardiac arrest 8/2021with subsequent anoxic brain injury s/p PEG tube and trach, DM2, history of chronic diastolic congestive heart failure who presented to [facility] ED on 6/12 with worsening respiratory failure from SNF.  Patient previously was ventilator dependent but had been weaned from her trach.  Patient was admitted to the PCCM service underwent bronchoscopy on 10/05/2020 which revealed dynamic collapse of her trachea and subsequently her tracheostomy was replaced.  Remains dependent on ventilator support and poorly interactive.  Patient was seen by palliative care on 10/13/2020 with plans of no de-escalation of care per POA.  Patient's care was transferred to [facility] on 11/21/2020.   Assessment & Plan:   Principal Problem:   Sepsis ([place]) Active Problems:   COPD (chronic obstructive pulmonary disease) (HCC)   Chronic diastolic CHF (congestive heart failure) (HCC)   Acute on chronic respiratory failure with hypoxia (HCC)   GERD (gastroesophageal reflux disease)   Seizures (HCC)   Type 2 diabetes mellitus without complication, with long-term current use of insulin (HCC)   Severe hypoxic-ischemic encephalopathy   Tracheostomy dependence ([place])   Pressure injury of skin   Acute on chronic hypoxic respiratory failure Sepsis: Ruled out Etiology likely secondary to anoxic brain injury with dynamic airway collapse noted on bronchoscopy 10/05/2020.  Continues to remain vent dependent.  Initially concern for aspiration pneumonia/bacteremia, evaluated by infectious disease who suggests infection less likely with reassuring procalcitonin.  Patient was initially started on IV antibiotics which were discontinued.  Bronchoscopy on 11/24/2020 showed tracheal/bronchial malacia with complete dynamic collapse of the  right mainstem bronchus was observed.  Repeat bronchoscopy 8/6 for airway occlusion with findings of migrated tracheostomy which was repositioned with resolution of airway occlusion. 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  Status post tracheostomy and PEG tube.  Seen by neurology who suggested patient does not appear to have meaningful recovery.  Patient was evaluated by palliative care and after further discussion with family plans for not to de-escalate any level of care at this point; although seems futile given no significant recovery at this point with continued slow deterioration.  Seizure disorder in the setting of anoxic brain injury --Clonazepam 0.74m per tube BID --Keppra 2500 mg per tube twice daily --Valproic acid 1000 mg per tube every 12 hours --Lorazepam 2-4 mg IV every hours as needed seizure  Insulin-dependent diabetes mellitus Hemoglobin A1c 7.0, well controlled. --Lantus 36 units subcutaneously daily --Moderate SSI for further coverage --CBGs every 4 hours  Hx CVA --Plavix 75 mg per tube daily  Chronic diastolic congestive heart failure, compensated --On 40% FiO2 with full vent support as above  Anemia of chronic medical disease Hemoglobin stable, 10.7 on 11/22/2020. --CBC weekly   DVT prophylaxis:   Lovenox   Code Status: Full Code Family Communication: No family present at bedside this morning  Disposition Plan:  Level of care: ICU  Status is: Inpatient  Remains  inpatient appropriate because:Unsafe d/c plan and Inpatient level of care appropriate due to severity of illness  Dispo: The patient is from: SNF              Anticipated d/c is to: SNF              Patient currently is medically stable to d/c.   Difficult to place patient Yes   Consultants:  PCCM Neurology Palliative care Infectious disease, Dr. Linus Salmons  Procedures:  EEG 6/26 - 6/28 Bronchoscopy on 11/24/2020 showed tracheal/bronchial malacia with complete dynamic collapse of the right mainstem bronchus was observed.   Bronchoscopy 8/6 for airway occlusion with findings of migrated tracheostomy which was repositioned with resolution of airway occlusion.  Antimicrobials:  Vancomycin 6/12 - 6/15, 6/18 - 6/20 Cefepime 6/12 - 6/15, 6/18 - 6/18 Metronidazole 6/12 - 6/15   Subjective: Patient seen examined at bedside, resting comfortably.  Remains on full vent support.  RN present.  No family present at bedside today.  Tracheostomy site with slight migration overnight which was repositioned by RT.  Patient currently comfortable on vent settings.  Seen by PCCM this morning and started on IV Solu-Medrol for 3-4 days.  Patient remains minimally responsive with overall prognosis grim/poor with continued gradual deterioration.  Unlikely to obtain any meaningful recovery due to her prior cardiac arrest with anoxic brain injury. Difficult SNF placement due to ventilator dependent.  No other acute concerns per nursing staff this morning.  Objective: Vitals:   11/26/20 0800 11/26/20 0803 11/26/20 0930 11/26/20 1000  BP: 138/81 138/81 (!) 147/71 (!) 137/98  Pulse: 86 91 88 88  Resp: _0 Temp:      TempSrc:      SpO2: 98% 100% 100% 98%  Weight:      Height:        Intake/Output Summary (Last 24 hours) at 11/26/2020 1215 Last data filed at 11/26/2020 0800 Gross per 24 hour  Intake 1140 ml  Output 300 ml  Net 840 ml   Filed Weights   11/24/20 0457 11/25/20 0104 11/26/20 0500  Weight:  88.7 kg 88.3 kg 89.2 kg    Examination:  General exam: Chronically ill in appearance, cachectic, appears older than stated age, on full vent support Respiratory system: Clear to auscultation bilaterally, no crackles/wheezing, on vent support with FiO2 40% and 10 PEEP Cardiovascular system: S1 & S2 heard, RRR. No JVD, murmurs, rubs, gallops or clicks. No pedal edema. Gastrointestinal system: Abdomen is nondistended, soft and nontender. No organomegaly or masses felt. Normal bowel sounds heard.  PEG tube noted with tube feeds infusing Central nervous system: Alert, opens eyes but not following commands Extremities: No peripheral edema Skin: No rashes, lesions or ulcers Psychiatry: Unable to assess due to mental status    Data Reviewed: I have personally reviewed following labs and imaging studies  CBC: Recent Labs  Lab 11/20/20 1253 11/22/20 0755  WBC  --  10.1  HGB 11.2* 10.7*  HCT 33.0* 35.7*  MCV  --  88.8  PLT  --  213   Basic Metabolic Panel: Recent Labs  Lab 11/20/20 1253 11/22/20 0755  NA 145 140  K 4.2 4.0  CL  --  104  CO2  --  30  GLUCOSE  --  142*  BUN  --  24*  CREATININE  --  0.44  CALCIUM  --  10.7*   GFR: Estimated Creatinine Clearance: 71.7 mL/min (by C-G formula  based on SCr of 0.44 mg/dL). Liver Function Tests: No results for input(s): AST, ALT, ALKPHOS, BILITOT, PROT, ALBUMIN in the last 168 hours. No results for input(s): LIPASE, AMYLASE in the last 168 hours. No results for input(s): AMMONIA in the last 168 hours. Coagulation Profile: No results for input(s): INR, PROTIME in the last 168 hours.  Cardiac Enzymes: No results for input(s): CKTOTAL, CKMB, CKMBINDEX, TROPONINI in the last 168 hours. BNP (last 3 results) No results for input(s): PROBNP in the last 8760 hours. HbA1C: No results for input(s): HGBA1C in the last 72 hours. CBG: Recent Labs  Lab 11/25/20 1933 11/25/20 2341 11/26/20 0402 11/26/20 0751 11/26/20 1128  GLUCAP 242*  150* 261* 219* 149*   Lipid Profile: No results for input(s): CHOL, HDL, LDLCALC, TRIG, CHOLHDL, LDLDIRECT in the last 72 hours. Thyroid Function Tests: No results for input(s): TSH, T4TOTAL, FREET4, T3FREE, THYROIDAB in the last 72 hours. Anemia Panel: No results for input(s): VITAMINB12, FOLATE, FERRITIN, TIBC, IRON, RETICCTPCT in the last 72 hours. Sepsis Labs: No results for input(s): PROCALCITON, LATICACIDVEN in the last 168 hours.  No results found for this or any previous visit (from the past 240 hour(s)).       Radiology Studies: DG CHEST PORT 1 VIEW  Result Date: 11/25/2020 CLINICAL DATA:  Acute respiratory failure with hypoxemia. EXAM: PORTABLE CHEST 1 VIEW COMPARISON:  Chest radiograph dated 11/24/2020. FINDINGS: The heart size and mediastinal contours are within normal limits. A tracheostomy tube is redemonstrated, terminating in the midthoracic trachea. Mild bibasilar interstitial opacities are noted. There is no pleural effusion or pneumothorax. IMPRESSION: Mild bibasilar interstitial opacities are nonspecific but may represent pulmonary edema or atelectasis. Electronically Signed   By: Zerita Boers M.D.   On: 11/25/2020 11:42        Scheduled Meds:  arformoterol  15 mcg Nebulization BID   baclofen  5 mg Per Tube TID   budesonide  0.5 mg Nebulization BID   chlorhexidine gluconate (MEDLINE KIT)  15 mL Mouth Rinse BID   Chlorhexidine Gluconate Cloth  6 each Topical Daily   clonazePAM  0.5 mg Per Tube BID   clopidogrel  75 mg Per Tube Daily   enoxaparin (LOVENOX) injection  0.5 mg/kg Subcutaneous Q24H   famotidine  20 mg Per Tube BID   feeding supplement (PROSource TF)  45 mL Per Tube BID   fiber  1 packet Per Tube BID   Gerhardt's butt cream   Topical BID   guaiFENesin  5 mL Per Tube Q6H   hydrocortisone cream   Topical TID   insulin aspart  0-15 Units Subcutaneous Q4H   insulin glargine  36 Units Subcutaneous QHS   levETIRAcetam  2,500 mg Per Tube BID    loratadine  10 mg Per Tube Daily   mouth rinse  15 mL Mouth Rinse 10 times per day   methylPREDNISolone (SOLU-MEDROL) injection  40 mg Intravenous BID   montelukast  10 mg Per Tube Daily   nutrition supplement (JUVEN)  1 packet Per Tube BID   [START ON 11/27/2020] predniSONE  40 mg Per Tube Q breakfast   revefenacin  175 mcg Nebulization Daily   tiZANidine  2 mg Per Tube QHS   valproic acid  1,000 mg Per Tube Q12H   Continuous Infusions:  sodium chloride     feeding supplement (JEVITY 1.5 CAL/FIBER) 1,000 mL (11/24/20 0458)     LOS: 56 days    Time spent: 41 minutes spent on chart review, discussion with  nursing staff, consultants, updating family and interview/physical exam; more than 50% of that time was spent in counseling and/or coordination of care.    Yoceline Bazar J [place] DO [facility] Available via Epic secure chat 7am-7pm After these hours, please refer to coverage provider listed on [email or web address] 11/26/2020, 12:15 PM

## 2020-11-26 NOTE — Progress Notes (Signed)
NAME:  Sonia Drake, MRN:  [id number], DOB:  1955/10/08, LOS: 56 ADMISSION DATE:  10/24/2020, CONSULTATION DATE:  6/15 REFERRING MD:  Thedore Mins, CHIEF COMPLAINT:  Dyspnea   History of Present Illness:  65 y/o female with anoxic brain injury, tracheostomy status who was admitted from a SNF on 6/12 with worsening respiratory failure.  She had previously been ventilator dependent but had been weaned from the trach.  On 6/16 a bronchoscopy revealed dynamic collapse of her trachea, tracheostomy was replaced.  Later required mechanical ventilation.  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 No distress. WBC ct trending down. No events overnight growing GPC clusters in two of two cultures  6/16 increased work of breathing, upper airway noise, abdominal muscle use.  Bronchoscopy performed as trach exchange cuffless #8 Shiley XLT showed dynamic airway collapse, tracheal collapse. 6/16 continued respiratory distress moved to ICU, changed to cuffed #6 Shiley XLT, placed on MV with some improved comfort 6/12 cefepime > 6/14, 6/18 6/12 vanc > 6/14, 6/18 6/23 mucus plugging, severe vent dyssynchrony 6/24-25 intermittent jerking, seizures on EEG 6/27 weaned off propofol gtt 6/28 briefly tolerated trach collar in AM 6/29 tolerated trach collar in AM; no further seizure like activity; working with CM/SW for vent-SNF 7/1 Weaning on CPAP/ PS 10/5, CM SW working on [employer], NO seizure activity, secretions are not an issue per  nursing 7/11 trach changed to #8 Bivona with air cuff, much improved. 7/12 TCT 12 hours; rested on Vent overnight 7/13 on TCT 7/23 Trach collar tolerated 7/24 Full vent support 7/26>> Remains on full vent support this am, no issues with secretions per nursing 8/5 occluding trach with dynamic collapse: seen on bronchoscopy 8/6 called back emergently to bedside for tracheostomy occlusion: emergent bronch, bivona trach migrated back 2cm, improved with advancing to 2cm above carina  Interim History / Subjective:   No acute events with tracheostomy overnight  However the tube did migrate about 0.5 cm and had to be re-secured by RT  Objective   Blood pressure 123/64, pulse 75, temperature 97.6 F (36.4 C), temperature source Oral, resp. rate 18, height 5\' 2"  (1.575 m), weight 89.2 kg, SpO2 98 %.    Vent Mode: PRVC FiO2 (%):  [40 %] 40 % Set Rate:  [16 bmp] 16 bmp Vt Set:  [400 mL] 400 mL PEEP:  [5 cmH20-10 cmH20] 10 cmH20 Plateau Pressure:  [18 cmH20-19 cmH20] 18 cmH20   Intake/Output Summary (Last 24 hours) at 11/26/2020 0754 Last data filed at 11/26/2020 0600 Gross per 24 hour  Intake 1400 ml  Output 301 ml  Net 1099 ml   Filed Weights   11/24/20 0457 11/25/20 0104 11/26/20 0500  Weight: 88.7 kg 88.3 kg 89.2 kg    Examination:  General:  In bed on vent HENT: NCAT tracheostomy in place PULM: CTA B, vent supported breathing CV: RRR, no mgr GI: BS+, soft, nontender MSK: normal bulk and tone Neuro: eyes open, no purposeful movements    Resolved Hospital Problem list     Assessment & Plan:  Acute on chronic respiratory failure with hypercarbia Severe COPD, recurrent exacerbation  Severe tracheobronchomalacia > causes occlusion of tracheostomy with dynamic collapse of airways; improved 8/7 with PEEP 10 cm H20 Severe ventilator dyssynchrony Anoxic brain injury Tracheostomy status    Plan: Continue PRVC at 10cm H20 PEEP to keep her airways open  Maintain Bivona  tracheostomy secured at 14 cm Fentanyl 50-130mcg IV  Plan systemic steroids for the next 3-4 days Maintain PEEP 10cm H20 Continue bronchodilators as ordered for COPD  I called her daughter [name] today and explained that I think there is a < 5% change of liberation from mechanical ventilation because her bronchomalacia with dynamic airway collapse is worse than it was when I saw her back in June and performed a bronchoscopy then.   We adjusted the ventilator to higher settings to keep her airways open.    Would continue goals of care conversations.  There is no clear medical benefit to continue high level mechanical ventilator support considering her severe anoxic brain injury.    Best Practice (right click and "Reselect all SmartList Selections" daily)   Diet/type: tubefeeds DVT prophylaxis: LMWH GI prophylaxis: H2B Lines: N/A Foley:  N/A Code Status:  full code Last date of multidisciplinary goals of care discussion [per TRH]  Labs   CBC: Recent Labs  Lab 11/20/20 1253 11/22/20 0755  WBC  --  10.1  HGB 11.2* 10.7*  HCT 33.0* 35.7*  MCV  --  88.8  PLT  --  237    Basic Metabolic Panel: Recent Labs  Lab 11/20/20 1253 11/22/20 0755  NA 145 140  K 4.2 4.0  CL  --  104  CO2  --  30  GLUCOSE  --  142*  BUN  --  24*  CREATININE  --  0.44  CALCIUM  --  10.7*   GFR: Estimated Creatinine Clearance: 71.7 mL/min (by C-G formula based on SCr of 0.44 mg/dL). Recent Labs  Lab 11/22/20 0755  WBC 10.1    Liver Function Tests: No results for input(s): AST, ALT, ALKPHOS, BILITOT, PROT, ALBUMIN in the last 168 hours. No results for input(s): LIPASE, AMYLASE in the last 168 hours. No results for input(s): AMMONIA in the last 168 hours.  ABG    Component Value Date/Time   PHART 7.414 11/20/2020 1253   PCO2ART 52.3 (H) 11/20/2020 1253   PO2ART 127 (H) 11/20/2020 1253   HCO3 33.6 (H) 11/20/2020 1253   TCO2 35 (H) 11/20/2020 1253   O2SAT 99.0 11/20/2020 1253      Coagulation Profile: No results for input(s): INR, PROTIME in the last 168 hours.   Cardiac Enzymes: No results for input(s): CKTOTAL, CKMB, CKMBINDEX, TROPONINI in the last 168 hours.  HbA1C: Hgb A1c MFr Bld  Date/Time Value Ref Range Status  10/25/2020 06:31 PM 7.0 (H) 4.8 - 5.6 % Final    Comment:    (NOTE)         Prediabetes: 5.7 - 6.4         Diabetes: >6.4         Glycemic control for adults with diabetes: <7.0   08/30/2019 04:53 AM 6.5 (H) 4.8 - 5.6 % Final    Comment:    (NOTE) Pre diabetes:          5.7%-6.4% Diabetes:              >6.4% Glycemic control for   <7.0% adults with diabetes     CBG: Recent Labs  Lab 11/25/20 1532 11/25/20 1933 11/25/20 2341 11/26/20 0402 11/26/20 0751  GLUCAP 204*  242* 150* 261* 219*    Critical care time: 31 minutes    Heber [place], MD [place] [facility] Pager: [phone] Cell: [phone] After 7:00 pm call Elink  [phone]

## 2020-11-26 NOTE — Plan of Care (Signed)
  Problem: Education: Goal: Knowledge of General Education information will improve Description: Including pain rating scale, medication(s)/side effects and non-pharmacologic comfort measures Outcome: Not Progressing   Problem: Health Behavior/Discharge Planning: Goal: Ability to manage health-related needs will improve Outcome: Not Progressing   

## 2020-11-27 DIAGNOSIS — A419 Sepsis, unspecified organism: Secondary | ICD-10-CM | POA: Diagnosis not present

## 2020-11-27 DIAGNOSIS — I5032 Chronic diastolic (congestive) heart failure: Secondary | ICD-10-CM | POA: Diagnosis not present

## 2020-11-27 DIAGNOSIS — J9621 Acute and chronic respiratory failure with hypoxia: Secondary | ICD-10-CM | POA: Diagnosis not present

## 2020-11-27 DIAGNOSIS — J9601 Acute respiratory failure with hypoxia: Secondary | ICD-10-CM | POA: Diagnosis not present

## 2020-11-27 LAB — GLUCOSE, CAPILLARY
Glucose-Capillary: 236 mg/dL — ABNORMAL HIGH (ref 70–99)
Glucose-Capillary: 214 mg/dL — ABNORMAL HIGH (ref 70–99)
Glucose-Capillary: 226 mg/dL — ABNORMAL HIGH (ref 70–99)
Glucose-Capillary: 262 mg/dL — ABNORMAL HIGH (ref 70–99)
Glucose-Capillary: 232 mg/dL — ABNORMAL HIGH (ref 70–99)
Glucose-Capillary: 189 mg/dL — ABNORMAL HIGH (ref 70–99)

## 2020-11-27 MED ORDER — INSULIN GLARGINE 100 UNIT/ML ~~LOC~~ SOLN
40.0000 [IU] | Freq: Every day | SUBCUTANEOUS | Status: DC
  Administered 2020-11-27 – 2020-12-21 (×25): 40 [IU] via SUBCUTANEOUS
  Filled 2020-11-27 (×26): qty 0.4

## 2020-11-27 NOTE — Progress Notes (Signed)
RT came to the bedside due to vent alarms. Patient's vent is alarming PIP and then low VT, MVe.  RT adjusted the patient's trach  by advancing it back to 14 and tightened her trach ties.  Delorise Shiner from [facility] and RN at bedside.  Patient's VTe and MVe less than 100.  RT took patient off the vent and manually bagged ventilated her for a few minutes while the RN retrieved more sedation medication.  CCM assisted with bagging while RT suctioned the patient with little secretions obtained.  Once the sedation medication took effect the patient was easier to ventilate and placed back on full vent support.  Her MVe, VT, and PIP returned to normal limits.  RT will continue to monitor.

## 2020-11-27 NOTE — TOC Progression Note (Signed)
Transition of Care [facility]) - Progression Note    Patient Details  Name: Sonia Drake MRN: [id number] Date of Birth: May 31, 1955  [facility] CM/SW Contact  Bess Kinds, RN Phone Number: [phone] 11/27/2020, 4:09 PM  Clinical Narrative:     Follow up call to [facility] [phone]) to follow up with referral made by TOC last week - voicemail left with contact information for CM and CSW.   Noted that patient is not on fentanyl. Remains vent dependent with recent hypoxic event alleviated by deep suction.   Ongoing conversations by MD with family concerning GOC and patient prognosis. TOC following.   Expected Discharge Plan: Skilled Nursing Facility Barriers to Discharge: No SNF bed  Expected Discharge Plan and Services Expected Discharge Plan: Skilled Nursing Facility   Discharge Planning Services: CM Consult                                           Social Determinants of Health (SDOH) Interventions    Readmission Risk Interventions No flowsheet data found.

## 2020-11-27 NOTE — Progress Notes (Signed)
Attending note: I have seen and examined the patient. History, labs and imaging reviewed.  Interval History:  8/8: remains in icu, no changes. Multiple events of hypoxia reported that were resolved with deep suction and lavage. D/w daughter [name] her mothers change and continued events. I discussed with her that certainly we can cont to do our support as we are but in the event she suffer a hypoxic event and subsequent cardiac arrest I indicated it would be relatively futile in light of her chronic conditions, inability to survive without vent at baseline and lack of neurologic improvement over that past year with obvious decline not to mention the trauma of cpr. [name] stated that her mother has paperwork that states she would want to remain a full code ( I have asked her to provide that). She also stated that her mother would want to remain on long term support even in a persistent vegetative state even so far as to state "she would want to be a broccoli one day a carrot the next".    Labs reviewed, significant for hgb 10.7  Imaging: no new imaging  Assessment/plan: Anoxic injury post cardiac arrest:  -no neurologic change since event (>1 year ago) -cont vent support  Hypoxic resp failure:  -vent dependent Tracheomalacia:  -cont to have events.  -d/w daughter futility of cpr at this state should mother suffer cardiac arrest 2/2 hypoxia 2/2 tracheomalacia. Daughter states mother wants to remain on life support indefinitely even if in a persistent vegetative state.   Goals of care:  -as above -requested daughter bring in the paperwork that states her mothers wishes (daughter states she has these)  The patient is critically ill with multiple organ systems failure and requires high complexity decision making for assessment and support, frequent evaluation and titration of therapies, application of advanced monitoring technologies and extensive interpretation of multiple databases.   Critical care time 39 mins. This represents my time independent of the NP's/PA's/med student/residents time taking care of the pt. This is excluding procedures   Briant Sites DO [facility] 11/27/2020, 1:12 PM See Amion for pager If no response to pager, please call [phone] until 1900 After 1900 please call [facility] [phone]

## 2020-11-27 NOTE — Progress Notes (Addendum)
NAME:  Sonia Drake, MRN:  [id number], DOB:  1956/01/10, LOS: 57 ADMISSION DATE:  October 31, 2020, CONSULTATION DATE:  6/15 REFERRING MD:  Thedore Mins, CHIEF COMPLAINT:  Dyspnea   History of Present Illness:  65 y/o female with anoxic brain injury, tracheostomy status who was admitted from a SNF on 6/12 with worsening respiratory failure.  She had previously been ventilator dependent but had been weaned from the trach.  On 6/16 a bronchoscopy revealed dynamic collapse of her trachea, tracheostomy was replaced.  Later required mechanical ventilation.  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 No distress. WBC ct trending down. No events overnight growing GPC clusters in two of two cultures  6/16 increased work of breathing, upper airway noise, abdominal muscle use.  Bronchoscopy performed as trach exchange cuffless #8 Shiley XLT showed dynamic airway collapse, tracheal collapse. 6/16 continued respiratory distress moved to ICU, changed to cuffed #6 Shiley XLT, placed on MV with some improved comfort 6/12 cefepime > 6/14, 6/18 6/12 vanc > 6/14, 6/18 6/23 mucus plugging, severe vent dyssynchrony 6/24-25 intermittent jerking, seizures on EEG 6/27 weaned off propofol gtt 6/28 briefly tolerated trach collar in AM 6/29 tolerated trach collar in AM; no further seizure like activity; working with CM/SW for vent-SNF 7/1 Weaning on CPAP/ PS 10/5, CM SW working on [employer], NO seizure activity, secretions are not an issue per  nursing 7/11 trach changed to #8 Bivona with air cuff, much improved. 7/12 TCT 12 hours; rested on Vent overnight 7/13 on TCT 7/23 Trach collar tolerated 7/24 Full vent support 7/26>> Remains on full vent support this am, no issues with secretions per nursing 8/5 occluding trach with dynamic collapse: seen on bronchoscopy 8/6 called back emergently to bedside for tracheostomy occlusion: emergent bronch, bivona trach migrated back 2cm, improved with advancing to 2cm above carina  Interim History / Subjective:   No acute events with tracheostomy overnight  However the tube did migrate about 0.5 cm and had to be re-secured by RT  Objective   Blood pressure (!) 117/58, pulse 67, temperature 97.7 F (36.5 C), temperature source Oral, resp. rate 16, height 5\' 2"  (1.575 m), weight 88.7 kg, SpO2 98 %.    Vent Mode: PRVC FiO2 (%):  [40 %] 40 % Set Rate:  [16 bmp] 16 bmp Vt Set:  [400 mL] 400 mL PEEP:  [10 cmH20] 10 cmH20 Plateau Pressure:  [15 cmH20-20 cmH20] 20 cmH20   Intake/Output Summary (Last 24 hours) at 11/27/2020 0902 Last data filed at 11/27/2020 0800 Gross per 24 hour  Intake 945 ml  Output 1300 ml  Net -355 ml   Filed Weights   11/25/20 0104 11/26/20 0500 11/27/20 0500  Weight: 88.3 kg 89.2 kg 88.7 kg    Examination:  General:  In bed on vent HENT: NCAT tracheostomy in place PULM: CTA B, vent supported breathing CV: RRR, no mgr GI: BS+, soft, nontender MSK: normal bulk and tone Neuro: eyes open, no purposeful movements    Resolved Hospital Problem list     Assessment & Plan:     Acute on chronic respiratory failure with hypercarbia, hypoxia -  desats with hyperdynamic collapse of airway Severe tracheobronchomalacia  -etiology of trach occlusion, dynamic airway collapse. Initially improved with incr PEEP and trach repositioning but 8/8 still recurring. Associated severe dyssynchrony  Anoxic brain injury / Anoxic encephalopathy  Tracheostomy status   COPD Plan: PRVC, PEEP 10  Bivona trach at 14cm -- though do wonder if alt trach would be worth a try as this one is so flexible it seems to move  PRN fent  Systemic steroids x3-4 d   Goals of Care:  Dr Kendrick Fries ccm --"called her daughter [name] today and explained that I think there is a < 5% change of liberation from mechanical ventilation because her bronchomalacia with dynamic airway collapse is worse than it was when I saw her back in June and performed a bronchoscopy then.   We adjusted the ventilator to higher settings to keep her airways open.   Would continue goals of care conversations.  There is no clear medical benefit to continue high level mechanical ventilator support considering her severe anoxic brain injury."   CCM will continue to discuss this with family. It appears that multiple service lines have attempted goals of cares discussions to date. The prognosis for a meaningful recovery is poor. Additionally, the patient's multiple respiratory problems are quite concerning-- bluntly, I worry that there might be a time where her trach occlusion / airway collapse may not be improved with current strategies of BVM, sedation, PEEP -- this would be a distressing death.   Likely role for involving ethics in this case in the near future    Best Practice (right click and "Reselect all SmartList Selections" daily)   Diet/type: tubefeeds DVT prophylaxis: LMWH GI prophylaxis: H2B Lines: N/A Foley:  N/A Code Status:  full code Last date of multidisciplinary goals of care discussion [per TRH]   Labs   CBC: Recent Labs  Lab 11/20/20 1253 11/22/20 0755  WBC  --  10.1  HGB 11.2* 10.7*  HCT 33.0* 35.7*  MCV  --  88.8  PLT  --  237    Basic Metabolic Panel: Recent Labs  Lab 11/20/20 1253 11/22/20 0755  NA 145 140  K 4.2 4.0  CL  --  104  CO2  --  30  GLUCOSE  --  142*  BUN  --  24*  CREATININE  --  0.44  CALCIUM  --  10.7*   GFR: Estimated Creatinine  Clearance: 71.5 mL/min (by C-G formula based on SCr of 0.44 mg/dL). Recent Labs  Lab 11/22/20 0755  WBC 10.1    Liver Function Tests: No results for input(s): AST, ALT, ALKPHOS, BILITOT, PROT, ALBUMIN in the last 168 hours. No results for input(s): LIPASE, AMYLASE in the last 168 hours. No results for input(s): AMMONIA in the last 168 hours.  ABG    Component Value Date/Time   PHART 7.414 11/20/2020 1253   PCO2ART 52.3 (H) 11/20/2020 1253   PO2ART 127 (H) 11/20/2020 1253   HCO3 33.6 (H) 11/20/2020 1253   TCO2 35 (H) 11/20/2020 1253   O2SAT 99.0 11/20/2020 1253     Coagulation Profile: No results for input(s): INR, PROTIME in the last 168 hours.   Cardiac Enzymes: No results for input(s): CKTOTAL, CKMB, CKMBINDEX, TROPONINI in the last 168 hours.  HbA1C: Hgb A1c MFr Bld  Date/Time Value Ref Range Status  10/02/2020 06:31 PM 7.0 (H) 4.8 - 5.6 % Final    Comment:    (NOTE)         Prediabetes:  5.7 - 6.4         Diabetes: >6.4         Glycemic control for adults with diabetes: <7.0   08/30/2019 04:53 AM 6.5 (H) 4.8 - 5.6 % Final    Comment:    (NOTE) Pre diabetes:          5.7%-6.4% Diabetes:              >6.4% Glycemic control for   <7.0% adults with diabetes     CBG: Recent Labs  Lab 11/26/20 1600 11/26/20 1951 11/26/20 2310 11/27/20 0355 11/27/20 0733  GLUCAP 238* 216* 165* 236* 214*    CRITICAL CARE Performed by: Lanier Clam   Total critical care time: 34 minutes  Critical care time was exclusive of separately billable procedures and treating other patients.  Critical care was necessary to treat or prevent imminent or life-threatening deterioration.  Critical care was time spent personally by me on the following activities: development of treatment plan with patient and/or surrogate as well as nursing, discussions with consultants, evaluation of patient's response to treatment, examination of patient, obtaining history from patient or  surrogate, ordering and performing treatments and interventions, ordering and review of laboratory studies, ordering and review of radiographic studies, pulse oximetry and re-evaluation of patient's condition.  Tessie Fass MSN, AGACNP-BC [facility] Pulmonary/Critical Care Medicine Amion for pager 11/27/2020, 9:02 AM

## 2020-11-27 NOTE — Progress Notes (Signed)
PROGRESS NOTE    Sonia Drake  WUJ:[id number] DOB: 23-Oct-1955 DOA: 10/19/2020 PCP: Garwin Brothers, MD    Brief Narrative:  Sonia Drake is a 65 year old female with past medical history significant for cardiac arrest 8/2021with subsequent anoxic brain injury s/p PEG tube and trach, DM2, history of chronic diastolic congestive heart failure who presented to [facility] ED on 6/12 with worsening respiratory failure from SNF.  Patient previously was ventilator dependent but had been weaned from her trach.  Patient was admitted to the PCCM service underwent bronchoscopy on 10/05/2020 which revealed dynamic collapse of her trachea and subsequently her tracheostomy was replaced.  Remains dependent on ventilator support and poorly interactive.  Patient was seen by palliative care on 10/13/2020 with plans of no de-escalation of care per POA.  Patient's care was transferred to [facility] on 11/21/2020.   Assessment & Plan:   Principal Problem:   Sepsis ([place]) Active Problems:   COPD (chronic obstructive pulmonary disease) (HCC)   Chronic diastolic CHF (congestive heart failure) (HCC)   Acute on chronic respiratory failure with hypoxia (HCC)   GERD (gastroesophageal reflux disease)   Seizures (HCC)   Type 2 diabetes mellitus without complication, with long-term current use of insulin (HCC)   Severe hypoxic-ischemic encephalopathy   Tracheostomy dependence ([place])   Pressure injury of skin   Acute on chronic hypoxic respiratory failure Sepsis: Ruled out Etiology likely secondary to anoxic brain injury with dynamic airway collapse noted on bronchoscopy 10/05/2020.  Continues to remain vent dependent.  Initially concern for aspiration pneumonia/bacteremia, evaluated by infectious disease who suggests infection less likely with reassuring procalcitonin.  Patient was initially started on IV antibiotics which were discontinued.  Bronchoscopy on 11/24/2020 showed tracheal/bronchial malacia with complete dynamic collapse of the  right mainstem bronchus was observed.  Repeat bronchoscopy 8/6 for airway occlusion with findings of migrated tracheostomy which was repositioned with resolution of airway occlusion. 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  Status post tracheostomy and PEG tube.  Seen by neurology who suggested patient does not appear to have meaningful recovery.  Patient was evaluated by palliative care and after further discussion with family plans for not to de-escalate any level of care at this point; although seems futile given no significant recovery at this point with continued slow deterioration.  Seizure disorder in the setting of anoxic brain injury --Clonazepam 0.74m per tube BID --Keppra 2500 mg per tube twice daily --Valproic acid 1000 mg per tube every 12 hours --Lorazepam 2-4 mg IV every hours as needed seizure  Insulin-dependent diabetes mellitus Hemoglobin A1c 7.0, well controlled. --Lantus 36 units subcutaneously daily --Moderate SSI for further coverage --CBGs every 4 hours  Hx CVA --Plavix 75 mg per tube daily  Chronic diastolic congestive heart failure, compensated --On 40% FiO2 with full vent support as above  Anemia of chronic medical disease Hemoglobin stable, 10.7 on 11/22/2020. --CBC weekly   DVT prophylaxis:   Lovenox   Code Status: Full Code Family Communication: No family present at bedside this morning  Disposition Plan:  Level of care: ICU  Status is: Inpatient  Remains  inpatient appropriate because:Unsafe d/c plan and Inpatient level of care appropriate due to severity of illness  Dispo: The patient is from: SNF              Anticipated d/c is to: SNF              Patient currently is medically stable to d/c.   Difficult to place patient Yes   Consultants:  PCCM Neurology Palliative care Infectious disease, Dr. Linus Salmons  Procedures:  EEG 6/26 - 6/28 Bronchoscopy on 11/24/2020 showed tracheal/bronchial malacia with complete dynamic collapse of the right mainstem bronchus was observed.   Bronchoscopy 8/6 for airway occlusion with findings of migrated tracheostomy which was repositioned with resolution of airway occlusion.  Antimicrobials:  Vancomycin 6/12 - 6/15, 6/18 - 6/20 Cefepime 6/12 - 6/15, 6/18 - 6/18 Metronidazole 6/12 - 6/15   Subjective: Patient seen examined at bedside, resting comfortably.  Remains on full vent support.  RN present.  No family present at bedside this morning.  Patient once again this morning with recurrent ventilation dyssynchrony with migration of trach site, patient was bagged, given IV pain medication/sedation and replaced on vent support; this seems to be now a daily issue.  Patient remains minimally responsive with overall prognosis grim/poor with continued gradual deterioration.  Unlikely to obtain any meaningful recovery due to her prior cardiac arrest with anoxic brain injury. Difficult SNF placement due to ventilator dependent.  No other acute concerns per nursing staff this morning.  Objective: Vitals:   11/27/20 0808 11/27/20 0812 11/27/20 1000 11/27/20 1126  BP: 113/72 (!) 117/58 (!) 105/55   Pulse: 69 67 (!) 45   Resp: _0 Temp:    97.6 F (36.4 C)  TempSrc:    Axillary  SpO2: 98% 98% 100%   Weight:      Height:        Intake/Output Summary (Last 24 hours) at 11/27/2020 1136 Last data filed at 11/27/2020 0800 Gross per 24 hour  Intake 855 ml  Output 1300 ml  Net -445 ml   Filed Weights    11/25/20 0104 11/26/20 0500 11/27/20 0500  Weight: 88.3 kg 89.2 kg 88.7 kg    Examination:  General exam: Chronically ill in appearance, cachectic, appears older than stated age, on full vent support Respiratory system: Clear to auscultation bilaterally, no crackles/wheezing, on vent support with FiO2 40% and 10 PEEP Cardiovascular system: S1 & S2 heard, RRR. No JVD, murmurs, rubs, gallops or clicks. No pedal edema. Gastrointestinal system: Abdomen is nondistended, soft and nontender. No organomegaly or masses felt. Normal bowel sounds heard.  PEG tube noted with tube feeds infusing Central nervous system: Alert, opens eyes but not following commands Extremities: No peripheral edema Skin: No rashes, lesions or ulcers Psychiatry: Unable to assess due to mental status    Data Reviewed: I have personally reviewed following labs and imaging studies  CBC: Recent Labs  Lab 11/20/20 1253 11/22/20 0755  WBC  --  10.1  HGB 11.2* 10.7*  HCT 33.0* 35.7*  MCV  --  88.8  PLT  --  824   Basic Metabolic Panel: Recent Labs  Lab 11/20/20 1253 11/22/20 0755  NA 145 140  K 4.2 4.0  CL  --  104  CO2  --  30  GLUCOSE  --  142*  BUN  --  24*  CREATININE  --  0.44  CALCIUM  --  10.7*   GFR: Estimated  Creatinine Clearance: 71.5 mL/min (by C-G formula based on SCr of 0.44 mg/dL). Liver Function Tests: No results for input(s): AST, ALT, ALKPHOS, BILITOT, PROT, ALBUMIN in the last 168 hours. No results for input(s): LIPASE, AMYLASE in the last 168 hours. No results for input(s): AMMONIA in the last 168 hours. Coagulation Profile: No results for input(s): INR, PROTIME in the last 168 hours.  Cardiac Enzymes: No results for input(s): CKTOTAL, CKMB, CKMBINDEX, TROPONINI in the last 168 hours. BNP (last 3 results) No results for input(s): PROBNP in the last 8760 hours. HbA1C: No results for input(s): HGBA1C in the last 72 hours. CBG: Recent Labs  Lab 11/26/20 1951 11/26/20 2310  11/27/20 0355 11/27/20 0733 11/27/20 1124  GLUCAP 216* 165* 236* 214* 226*   Lipid Profile: No results for input(s): CHOL, HDL, LDLCALC, TRIG, CHOLHDL, LDLDIRECT in the last 72 hours. Thyroid Function Tests: No results for input(s): TSH, T4TOTAL, FREET4, T3FREE, THYROIDAB in the last 72 hours. Anemia Panel: No results for input(s): VITAMINB12, FOLATE, FERRITIN, TIBC, IRON, RETICCTPCT in the last 72 hours. Sepsis Labs: No results for input(s): PROCALCITON, LATICACIDVEN in the last 168 hours.  No results found for this or any previous visit (from the past 240 hour(s)).       Radiology Studies: No results found.      Scheduled Meds:  arformoterol  15 mcg Nebulization BID   baclofen  5 mg Per Tube TID   budesonide  0.5 mg Nebulization BID   chlorhexidine gluconate (MEDLINE KIT)  15 mL Mouth Rinse BID   Chlorhexidine Gluconate Cloth  6 each Topical Daily   clonazePAM  0.5 mg Per Tube BID   clopidogrel  75 mg Per Tube Daily   enoxaparin (LOVENOX) injection  0.5 mg/kg Subcutaneous Q24H   famotidine  20 mg Per Tube BID   feeding supplement (PROSource TF)  45 mL Per Tube BID   fiber  1 packet Per Tube BID   Gerhardt's butt cream   Topical BID   guaiFENesin  5 mL Per Tube Q6H   hydrocortisone cream   Topical TID   insulin aspart  0-15 Units Subcutaneous Q4H   insulin glargine  40 Units Subcutaneous QHS   levETIRAcetam  2,500 mg Per Tube BID   loratadine  10 mg Per Tube Daily   mouth rinse  15 mL Mouth Rinse 10 times per day   montelukast  10 mg Per Tube Daily   nutrition supplement (JUVEN)  1 packet Per Tube BID   predniSONE  40 mg Per Tube Q breakfast   revefenacin  175 mcg Nebulization Daily   tiZANidine  2 mg Per Tube QHS   valproic acid  1,000 mg Per Tube Q12H   Continuous Infusions:  sodium chloride     feeding supplement (JEVITY 1.5 CAL/FIBER) 1,000 mL (11/24/20 0458)     LOS: 57 days    Time spent: 41 minutes spent on chart review, discussion with nursing  staff, consultants, updating family and interview/physical exam; more than 50% of that time was spent in counseling and/or coordination of care.    Lashun Ramseyer J [place] DO [facility] Available via Epic secure chat 7am-7pm After these hours, please refer to coverage provider listed on [email or web address] 11/27/2020, 11:36 AM

## 2020-11-27 NOTE — Progress Notes (Signed)
Nutrition Follow-up  DOCUMENTATION CODES:   Not applicable  INTERVENTION:   Continue tube feeds via PEG: - Jevity 1.5 @ 45 ml/hr (1080 ml/day) - ProSource TF 45 ml BID   Tube feeding regimen provides 1700 kcal, 91 grams of protein, and 821 ml of H2O.   - Continue Juven BID via PEG, each packet provides 80 calories, 8 grams of carbohydrate, 2.5  grams of protein, 7 grams of L-arginine and 7 grams of L-glutamine; supplement contains CaHMB, vitamins C, E, B12 and zinc to promote wound healing  NUTRITION DIAGNOSIS:   Increased nutrient needs related to wound healing as evidenced by estimated needs.  Ongoing, being addressed via TF  GOAL:   Patient will meet greater than or equal to 90% of their needs  Met via TF  MONITOR:   Vent status, Labs, Weight trends, TF tolerance, Skin, I & O's  REASON FOR ASSESSMENT:   Consult Enteral/tube feeding initiation and management  ASSESSMENT:   Sonia Drake is a 65 y.o. female with medical history significant of asthma/COPD, CHF, cardiac arrest with PEA/CPR with chronic respiratory failure on permanent trach with supplemental oxygen at 6L, type 2 DM, GERD, seizures and severe hypoxic-ischemic encephalopathy who presented to ER via EMS for respiratory distress from SNF.  Discussed pt with RN and during ICU rounds. Pt tolerating current TF without issue. Pt's daughter in today for ongoing [place] discussions.  Admit weight: 88.4 kg Current weight: 88.7 kg  Pt with +4 pitting edema to hands and non-pitting edema to BLE.  Patient remains on ventilator support via trach MV: 6.2 L/min Temp (24hrs), Avg:98.3 F (36.8 C), Min:97.9 F (36.6 C), Max:98.9 F (37.2 C)  Current TF: Jevity 1.5 @ 45 ml/hr, ProSource TF 45 ml BID   Medications reviewed and include: pepcid, nutrisource fiber, SSI q 4 hours, lantus 40 units daily, Juven BID, prednisone   Labs reviewed. CBG's: 142-262 x 24 hours   UOP: 1100 ml x 24 hours  Diet Order:   Diet  Order             Diet NPO time specified  Diet effective now                   EDUCATION NEEDS:   No education needs have been identified at this time  Skin:  Skin Assessment: Skin Integrity Issues: Other: MASD to buttocks  Last BM:  11/28/20 large type 5  Height:   Ht Readings from Last 1 Encounters:  10/12/20 _0  (1.575 m)    Weight:   Wt Readings from Last 1 Encounters:  11/28/20 88.7 kg    Ideal Body Weight:  45.5 kg  BMI:  Body mass index is 35.77 kg/m.  Estimated Nutritional Needs:   Kcal:  1600-1800  Protein:  90-105 grams  Fluid:  > 1.6 L    Gustavus Bryant, MS, RD, LDN Inpatient Clinical Dietitian Please see AMiON for contact information.

## 2020-11-27 NOTE — Plan of Care (Signed)
  Problem: Clinical Measurements: Goal: Ability to maintain clinical measurements within normal limits will improve Outcome: Progressing Goal: Will remain free from infection Outcome: Progressing Goal: Diagnostic test results will improve Outcome: Progressing Goal: Respiratory complications will improve Outcome: Progressing Goal: Cardiovascular complication will be avoided Outcome: Progressing   Problem: Activity: Goal: Risk for activity intolerance will decrease Outcome: Progressing   Problem: Nutrition: Goal: Adequate nutrition will be maintained Outcome: Progressing   Problem: Elimination: Goal: Will not experience complications related to bowel motility Outcome: Progressing Goal: Will not experience complications related to urinary retention Outcome: Progressing   Problem: Pain Managment: Goal: General experience of comfort will improve Outcome: Progressing   Problem: Safety: Goal: Ability to remain free from injury will improve Outcome: Progressing   Problem: Skin Integrity: Goal: Risk for impaired skin integrity will decrease Outcome: Progressing   Problem: Respiratory: Goal: Ability to maintain a clear airway and adequate ventilation will improve Outcome: Progressing   Problem: Activity: Goal: Ability to tolerate increased activity will improve Outcome: Progressing

## 2020-11-28 DIAGNOSIS — I5032 Chronic diastolic (congestive) heart failure: Secondary | ICD-10-CM | POA: Diagnosis not present

## 2020-11-28 DIAGNOSIS — J9621 Acute and chronic respiratory failure with hypoxia: Secondary | ICD-10-CM | POA: Diagnosis not present

## 2020-11-28 DIAGNOSIS — A419 Sepsis, unspecified organism: Secondary | ICD-10-CM | POA: Diagnosis not present

## 2020-11-28 DIAGNOSIS — J9601 Acute respiratory failure with hypoxia: Secondary | ICD-10-CM | POA: Diagnosis not present

## 2020-11-28 LAB — GLUCOSE, CAPILLARY
Glucose-Capillary: 142 mg/dL — ABNORMAL HIGH (ref 70–99)
Glucose-Capillary: 166 mg/dL — ABNORMAL HIGH (ref 70–99)
Glucose-Capillary: 228 mg/dL — ABNORMAL HIGH (ref 70–99)
Glucose-Capillary: 171 mg/dL — ABNORMAL HIGH (ref 70–99)
Glucose-Capillary: 222 mg/dL — ABNORMAL HIGH (ref 70–99)

## 2020-11-28 NOTE — Progress Notes (Signed)
Patient's vent alarming low MVe and VTe.  RT took patient off the vent and manually bag ventilated the patient until patient was given sedation to relax her trachea.  Patient suctioned with a small amount of tan secretions obtained.  Patient vitals remained stable through out.  Patient placed back on full vent support. VTe and MVe returned to normal limits.  MD notified.

## 2020-11-28 NOTE — Progress Notes (Addendum)
NAME:  Sonia Drake, MRN:  [id number], DOB:  1956/02/13, LOS: 58 ADMISSION DATE:  10/24/2020, CONSULTATION DATE:  6/15 REFERRING MD:  Thedore Mins, CHIEF COMPLAINT:  Dyspnea   History of Present Illness:  65 y/o female with anoxic brain injury, tracheostomy status who was admitted from a SNF on 6/12 with worsening respiratory failure.  She had previously been ventilator dependent but had been weaned from the trach.  On 6/16 a bronchoscopy revealed dynamic collapse of her trachea, tracheostomy was replaced.  Later required mechanical ventilation.  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 No distress. WBC ct trending down. No events overnight growing GPC clusters in two of two cultures  6/16 increased work of breathing, upper airway noise, abdominal muscle use.  Bronchoscopy performed as trach exchange cuffless #8 Shiley XLT showed dynamic airway collapse, tracheal collapse. 6/16 continued respiratory distress moved to ICU, changed to cuffed #6 Shiley XLT, placed on MV with some improved comfort 6/12 cefepime > 6/14, 6/18 6/12 vanc > 6/14, 6/18 6/23 mucus plugging, severe vent dyssynchrony 6/24-25 intermittent jerking, seizures on EEG 6/27 weaned off propofol gtt 6/28 briefly tolerated trach collar in AM 6/29 tolerated trach collar in AM; no further seizure like activity; working with CM/SW for vent-SNF 7/1 Weaning on CPAP/ PS 10/5, CM SW working on [employer], NO seizure activity, secretions are not an issue per  nursing 7/11 trach changed to #8 Bivona with air cuff, much improved. 7/12 TCT 12 hours; rested on Vent overnight 7/13 on TCT 7/23 Trach collar tolerated 7/24 Full vent support 7/26>> Remains on full vent support this am, no issues with secretions per nursing 8/5 occluding trach with dynamic collapse: seen on bronchoscopy 8/6 called back emergently to bedside for tracheostomy occlusion: emergent bronch, bivona trach migrated back 2cm, improved with advancing to 2cm above carina 8/8 collapse caused hypoxia. Improved with repositioning   Interim History / Subjective:   NAEO   Objective   Blood pressure (!) 91/41, pulse 67, temperature 98.9 F (37.2 C), temperature source Axillary, resp. rate 16, height 5\' 2"  (1.575 m), weight 88.7 kg, SpO2 99 %.    Vent Mode: PRVC FiO2 (%):  [40 %] 40 % Set Rate:  [16 bmp] 16 bmp Vt Set:  [400 mL] 400 mL PEEP:  [10 cmH20] 10 cmH20 Plateau Pressure:  [17 cmH20-19 cmH20] 19 cmH20   Intake/Output Summary (Last 24 hours) at 11/28/2020 01/27/2021 Last data filed at 11/28/2020 0900 Gross per 24 hour  Intake 990 ml  Output 1000 ml  Net -10 ml   Filed Weights   11/26/20 0500 11/27/20 0500 11/28/20 0449  Weight: 89.2 kg 88.7 kg 88.7 kg    Examination:  General:  Chronically ill appearing F, trach vent HENT: NCAT pink mmm anicteric sclera  PULM: Symmetrical chest expansion, faint wheeze, mechanically supported  CV: rr s1s2 cap refill < 3 GI: soft round ndnt  GU: purewick, yellow urine  MSK: no acute abnormality no cyanosis or clubbing  Neuro: Persistent vegetative state     Resolved Hospital Problem list     Assessment & Plan:   Anoxic encephalopathy /  anoxic brain injury Acute on chronic respiratory failure with hypercarbia and hypoxia  Severe tracheobronchomalacia  Tracheostomy status  Hx COPD  -desats with hyperdynamic collapse of airway -etiology of trach occlusion, dynamic airway collapse. Initially improved with incr PEEP and trach  repositioning but 8/8 still recurring. Associated severe dyssynchrony  Plan: Cont PRVC, PEEP 10  Bivona at 14 (tube is marked at correct position)-- though do wonder if alt trach would be worth a try as this one is so flexible it seems to move when pt has dynamic occlusion  PRN fent  Systemic steroids x3-4 d   Goals of Care: Multiple CCM team members as well as other service lines have discussed GOC with family. Family states pt had previously expressed vegetative state as acceptable QOL via documentation though this has not yet been produced.  Prognosis for meaningful recovery with anoxic injury is poor. Additionally, with hypoxia due to tracheobronchomalacia and dynamic airway collapse, I do worry that there may be a point where she sustains an occlusion/collapse that is not readily improved with BVM, PEEP, repositioning, sedation.  Continuing GOC talks. Family has been asked to produce aforementioned paperwork outlining pts wishes   Likely role for involving ethics in this case in the near future   Best Practice (right click and "Reselect all SmartList Selections" daily)   Diet/type: tubefeeds DVT prophylaxis: LMWH GI prophylaxis: H2B Lines: N/A Foley:  N/A Code Status:  full code Last date of multidisciplinary goals of care discussion [per [facility]]   Labs   CBC: Recent Labs  Lab 11/22/20 0755  WBC 10.1  HGB 10.7*  HCT 35.7*  MCV 88.8  PLT 237    Basic Metabolic Panel: Recent Labs  Lab 11/22/20 0755  NA 140  K 4.0  CL 104  CO2 30  GLUCOSE 142*  BUN 24*  CREATININE 0.44  CALCIUM 10.7*   GFR: Estimated Creatinine Clearance: 71.5 mL/min (by C-G formula based on SCr of 0.44 mg/dL). Recent Labs  Lab 11/22/20 0755  WBC 10.1    Liver Function Tests: No results for input(s): AST, ALT, ALKPHOS, BILITOT, PROT, ALBUMIN in the last 168 hours. No results for input(s): LIPASE, AMYLASE in the last 168 hours. No results for input(s): AMMONIA in the last 168  hours.  ABG    Component Value Date/Time   PHART 7.414 11/20/2020 1253   PCO2ART 52.3 (H) 11/20/2020 1253   PO2ART 127 (H) 11/20/2020 1253   HCO3 33.6 (H) 11/20/2020 1253   TCO2 35 (H) 11/20/2020 1253   O2SAT 99.0 11/20/2020 1253     Coagulation Profile: No results for input(s): INR, PROTIME in the last 168 hours.   Cardiac Enzymes: No results for input(s): CKTOTAL, CKMB, CKMBINDEX, TROPONINI in the last 168 hours.  HbA1C: Hgb A1c MFr Bld  Date/Time Value Ref Range Status  10/17/2020 06:31 PM 7.0 (H) 4.8 - 5.6 % Final    Comment:    (NOTE)         Prediabetes: 5.7 - 6.4         Diabetes: >6.4         Glycemic control for adults with diabetes: <7.0   08/30/2019 04:53 AM 6.5 (H) 4.8 - 5.6 % Final    Comment:    (NOTE) Pre diabetes:          5.7%-6.4% Diabetes:              >6.4% Glycemic control for   <7.0% adults with diabetes     CBG: Recent  Labs  Lab 11/27/20 1522 11/27/20 1939 11/27/20 2314 11/28/20 0342 11/28/20 0724  GLUCAP 262* 232* 189* 142* 166*    CRITICAL CARE Performed by: Lanier Clam   Total critical care time: 34 minutes  Critical care time was exclusive of separately billable procedures and treating other patients. Critical care was necessary to treat or prevent imminent or life-threatening deterioration.  Critical care was time spent personally by me on the following activities: development of treatment plan with patient and/or surrogate as well as nursing, discussions with consultants, evaluation of patient's response to treatment, examination of patient, obtaining history from patient or surrogate, ordering and performing treatments and interventions, ordering and review of laboratory studies, ordering and review of radiographic studies, pulse oximetry and re-evaluation of patient's condition.   Tessie Fass MSN, AGACNP-BC [facility] Pulmonary/Critical Care Medicine Amion for pager  11/28/2020, 9:22 AM

## 2020-11-28 NOTE — Progress Notes (Signed)
PROGRESS NOTE    Sonia Drake  MRN:[id number] DOB: 09/08/1955 DOA: 10/17/2020 PCP: Amin, Saad, MD    Brief Narrative:  Sonia Drake is a 64-year-old female with past medical history significant for cardiac arrest 8/2021with subsequent anoxic brain injury s/p PEG tube and trach, DM2, history of chronic diastolic congestive heart failure who presented to [facility] ED on 6/12 with worsening respiratory failure from SNF.  Patient previously was ventilator dependent but had been weaned from her trach.  Patient was admitted to the PCCM service underwent bronchoscopy on 10/05/2020 which revealed dynamic collapse of her trachea and subsequently her tracheostomy was replaced.  Remains dependent on ventilator support and poorly interactive.  Patient was seen by palliative care on 10/13/2020 with plans of no de-escalation of care per POA.  Patient's care was transferred to [facility] on 11/21/2020.   Assessment & Plan:   Principal Problem:   Sepsis (HCC) Active Problems:   COPD (chronic obstructive pulmonary disease) (HCC)   Chronic diastolic CHF (congestive heart failure) (HCC)   Acute on chronic respiratory failure with hypoxia (HCC)   GERD (gastroesophageal reflux disease)   Seizures (HCC)   Type 2 diabetes mellitus without complication, with long-term current use of insulin (HCC)   Severe hypoxic-ischemic encephalopathy   Tracheostomy dependence (HCC)   Pressure injury of skin   Acute on chronic hypoxic respiratory failure Sepsis: Ruled out Etiology likely secondary to anoxic brain injury with dynamic airway collapse noted on bronchoscopy 10/05/2020.  Continues to remain vent dependent.  Initially concern for aspiration pneumonia/bacteremia, evaluated by infectious disease who suggests infection less likely with reassuring procalcitonin.  Patient was initially started on IV antibiotics which were discontinued.  Bronchoscopy on 11/24/2020 showed tracheal/bronchial malacia with complete dynamic collapse of the  right mainstem bronchus was observed.  Repeat bronchoscopy 8/6 for airway occlusion with findings of migrated tracheostomy which was repositioned with resolution of airway occlusion. --PCCM following, appreciate assistance --Singulair 10 mg per tube daily --Budesonide neb twice daily --Yupelri neb daily --Brovana neb twice daily --Albuterol neb every 2 hours as needed wheezing/shortness of breath --Prednisone 40 mg per tube daily x4 doses --IV fentanyl as needed vent dyssynchrony --Continue vent support with routine tracheostomy care, on 10 of PEEP   Hx cardiac arrest 11/2019 with resultant severe anoxic brain injury Patient remains in a vegetative state, bedbound and nonverbal at baseline.  Status post tracheostomy and PEG tube.  Seen by neurology who suggested patient does not appear to have meaningful recovery.  Patient was evaluated by palliative care and after further discussion with family plans for not to de-escalate any level of care at this point; although seems futile given no significant recovery at this point with continued slow deterioration.  PCCM discussed with patient's daughter on 8/8, she stated that her mother previously expressed that being in a vegetative state was acceptable quality of life via documentation which has yet to be produced by family members and has been requested to be produced.  If family fails to produce documentation stating such, will likely need to involve the ethics committee  Seizure disorder in the setting of anoxic brain injury --Clonazepam 0.5mg per tube BID --Keppra 2500 mg per tube twice daily --Valproic acid 1000 mg per tube every 12 hours --Lorazepam 2-4 mg IV every hours as needed seizure  Insulin-dependent diabetes mellitus Hemoglobin A1c 7.0, well controlled. --Lantus 40 units subcutaneously daily --Moderate SSI for further coverage --CBGs every 4 hours  Hx CVA --Plavix 75 mg per tube daily    Chronic diastolic congestive heart failure,  compensated --On 40% FiO2 with full vent support as above  Anemia of chronic medical disease Hemoglobin stable, 10.7 on 11/22/2020. --CBC weekly   DVT prophylaxis:   Lovenox   Code Status: Full Code Family Communication: No family present at bedside this morning  Disposition Plan:  Level of care: ICU Status is: Inpatient  Remains inpatient appropriate because:Unsafe d/c plan and Inpatient level of care appropriate due to severity of illness  Dispo: The patient is from: SNF              Anticipated d/c is to: SNF              Patient currently is medically stable to d/c.   Difficult to place patient Yes   Consultants:  PCCM Neurology Palliative care Infectious disease, Dr. Linus Salmons  Procedures:  EEG 6/26 - 6/28 Bronchoscopy on 11/24/2020 showed tracheal/bronchial malacia with complete dynamic collapse of the right mainstem bronchus was observed.   Bronchoscopy 8/6 for airway occlusion with findings of migrated tracheostomy which was repositioned with resolution of airway occlusion.  Antimicrobials:  Vancomycin 6/12 - 6/15, 6/18 - 6/20 Cefepime 6/12 - 6/15, 6/18 - 6/18 Metronidazole 6/12 - 6/15   Subjective: Patient seen examined at bedside, resting comfortably.  Remains on full vent support.  No family present at bedside this morning.  Patient remains minimally responsive with overall prognosis grim/poor with continued gradual deterioration.  Unlikely to obtain any meaningful recovery due to her prior cardiac arrest with anoxic brain injury. Difficult SNF placement due to ventilator dependent.  PCCM discussed with patient's daughter yesterday regarding goals of care, with full scope to be continued. No other acute concerns per nursing staff this morning.  Objective: Vitals:   11/28/20 0726 11/28/20 0800 11/28/20 0806 11/28/20 0900  BP:  123/80  (!) 91/41  Pulse:  95 94 67  Resp:  (!) 21 (!) 23 16  Temp: 98.9 F (37.2 C)     TempSrc: Axillary     SpO2:  99% 99% 99%   Weight:      Height:        Intake/Output Summary (Last 24 hours) at 11/28/2020 1043 Last data filed at 11/28/2020 0900 Gross per 24 hour  Intake 945 ml  Output 1000 ml  Net -55 ml   Filed Weights   11/26/20 0500 11/27/20 0500 11/28/20 0449  Weight: 89.2 kg 88.7 kg 88.7 kg    Examination:  General exam: Chronically ill in appearance, cachectic, appears older than stated age, on full vent support Respiratory system: Clear to auscultation bilaterally, no crackles/wheezing, on vent support with FiO2 40% and 10 PEEP Cardiovascular system: S1 & S2 heard, RRR. No JVD, murmurs, rubs, gallops or clicks. No pedal edema. Gastrointestinal system: Abdomen is nondistended, soft and nontender. No organomegaly or masses felt. Normal bowel sounds heard.  PEG tube noted with tube feeds infusing Central nervous system: Alert, opens eyes but not following commands Extremities: No peripheral edema Skin: No rashes, lesions or ulcers Psychiatry: Unable to assess due to mental status    Data Reviewed: I have personally reviewed following labs and imaging studies  CBC: Recent Labs  Lab 11/22/20 0755  WBC 10.1  HGB 10.7*  HCT 35.7*  MCV 88.8  PLT 324   Basic Metabolic Panel: Recent Labs  Lab 11/22/20 0755  NA 140  K 4.0  CL 104  CO2 30  GLUCOSE 142*  BUN 24*  CREATININE 0.44  CALCIUM 10.7*   GFR:  Estimated Creatinine Clearance: 71.5 mL/min (by C-G formula based on SCr of 0.44 mg/dL). Liver Function Tests: No results for input(s): AST, ALT, ALKPHOS, BILITOT, PROT, ALBUMIN in the last 168 hours. No results for input(s): LIPASE, AMYLASE in the last 168 hours. No results for input(s): AMMONIA in the last 168 hours. Coagulation Profile: No results for input(s): INR, PROTIME in the last 168 hours.  Cardiac Enzymes: No results for input(s): CKTOTAL, CKMB, CKMBINDEX, TROPONINI in the last 168 hours. BNP (last 3 results) No results for input(s): PROBNP in the last 8760  hours. HbA1C: No results for input(s): HGBA1C in the last 72 hours. CBG: Recent Labs  Lab 11/27/20 1522 11/27/20 1939 11/27/20 2314 11/28/20 0342 11/28/20 0724  GLUCAP 262* 232* 189* 142* 166*   Lipid Profile: No results for input(s): CHOL, HDL, LDLCALC, TRIG, CHOLHDL, LDLDIRECT in the last 72 hours. Thyroid Function Tests: No results for input(s): TSH, T4TOTAL, FREET4, T3FREE, THYROIDAB in the last 72 hours. Anemia Panel: No results for input(s): VITAMINB12, FOLATE, FERRITIN, TIBC, IRON, RETICCTPCT in the last 72 hours. Sepsis Labs: No results for input(s): PROCALCITON, LATICACIDVEN in the last 168 hours.  No results found for this or any previous visit (from the past 240 hour(s)).       Radiology Studies: No results found.      Scheduled Meds:  arformoterol  15 mcg Nebulization BID   baclofen  5 mg Per Tube TID   budesonide  0.5 mg Nebulization BID   chlorhexidine gluconate (MEDLINE KIT)  15 mL Mouth Rinse BID   Chlorhexidine Gluconate Cloth  6 each Topical Daily   clonazePAM  0.5 mg Per Tube BID   clopidogrel  75 mg Per Tube Daily   enoxaparin (LOVENOX) injection  0.5 mg/kg Subcutaneous Q24H   famotidine  20 mg Per Tube BID   feeding supplement (PROSource TF)  45 mL Per Tube BID   fiber  1 packet Per Tube BID   Gerhardt's butt cream   Topical BID   guaiFENesin  5 mL Per Tube Q6H   hydrocortisone cream   Topical TID   insulin aspart  0-15 Units Subcutaneous Q4H   insulin glargine  40 Units Subcutaneous QHS   levETIRAcetam  2,500 mg Per Tube BID   loratadine  10 mg Per Tube Daily   mouth rinse  15 mL Mouth Rinse 10 times per day   montelukast  10 mg Per Tube Daily   nutrition supplement (JUVEN)  1 packet Per Tube BID   predniSONE  40 mg Per Tube Q breakfast   revefenacin  175 mcg Nebulization Daily   tiZANidine  2 mg Per Tube QHS   valproic acid  1,000 mg Per Tube Q12H   Continuous Infusions:  sodium chloride     feeding supplement (JEVITY 1.5  CAL/FIBER) 1,000 mL (11/28/20 0228)     LOS: 58 days    Time spent: 41 minutes spent on chart review, discussion with nursing staff, consultants, updating family and interview/physical exam; more than 50% of that time was spent in counseling and/or coordination of care.    Eric J Austria, DO [facility] Available via Epic secure chat 7am-7pm After these hours, please refer to coverage provider listed on [email or web address] 11/28/2020, 10:43 AM   

## 2020-11-28 NOTE — Plan of Care (Signed)
  Problem: Education: Goal: Knowledge of General Education information will improve Description: Including pain rating scale, medication(s)/side effects and non-pharmacologic comfort measures Outcome: Not Progressing   Problem: Health Behavior/Discharge Planning: Goal: Ability to manage health-related needs will improve Outcome: Not Progressing   Problem: Activity: Goal: Risk for activity intolerance will decrease Outcome: Not Progressing   Problem: Nutrition: Goal: Adequate nutrition will be maintained Outcome: Progressing

## 2020-11-29 DIAGNOSIS — A419 Sepsis, unspecified organism: Secondary | ICD-10-CM | POA: Diagnosis not present

## 2020-11-29 DIAGNOSIS — J9621 Acute and chronic respiratory failure with hypoxia: Secondary | ICD-10-CM | POA: Diagnosis not present

## 2020-11-29 LAB — CBC
HCT: 32.9 % — ABNORMAL LOW (ref 36.0–46.0)
Hemoglobin: 10.2 g/dL — ABNORMAL LOW (ref 12.0–15.0)
MCH: 27.3 pg (ref 26.0–34.0)
MCHC: 31 g/dL (ref 30.0–36.0)
MCV: 88.2 fL (ref 80.0–100.0)
Platelets: 288 10*3/uL (ref 150–400)
RBC: 3.73 MIL/uL — ABNORMAL LOW (ref 3.87–5.11)
RDW: 16.8 % — ABNORMAL HIGH (ref 11.5–15.5)
WBC: 9.1 10*3/uL (ref 4.0–10.5)
nRBC: 0 % (ref 0.0–0.2)

## 2020-11-29 LAB — GLUCOSE, CAPILLARY
Glucose-Capillary: 141 mg/dL — ABNORMAL HIGH (ref 70–99)
Glucose-Capillary: 153 mg/dL — ABNORMAL HIGH (ref 70–99)
Glucose-Capillary: 175 mg/dL — ABNORMAL HIGH (ref 70–99)
Glucose-Capillary: 179 mg/dL — ABNORMAL HIGH (ref 70–99)
Glucose-Capillary: 195 mg/dL — ABNORMAL HIGH (ref 70–99)
Glucose-Capillary: 251 mg/dL — ABNORMAL HIGH (ref 70–99)
Glucose-Capillary: 258 mg/dL — ABNORMAL HIGH (ref 70–99)

## 2020-11-29 LAB — BASIC METABOLIC PANEL
Anion gap: 7 (ref 5–15)
BUN: 28 mg/dL — ABNORMAL HIGH (ref 8–23)
CO2: 28 mmol/L (ref 22–32)
Calcium: 10.2 mg/dL (ref 8.9–10.3)
Chloride: 105 mmol/L (ref 98–111)
Creatinine, Ser: 0.47 mg/dL (ref 0.44–1.00)
GFR, Estimated: >60 mL/min (ref >60)
Glucose, Bld: 184 mg/dL — ABNORMAL HIGH (ref 70–99)
Potassium: 3.8 mmol/L (ref 3.5–5.1)
Sodium: 140 mmol/L (ref 135–145)

## 2020-11-29 NOTE — Progress Notes (Signed)
PROGRESS NOTE    Sonia Drake  JQB:[id number] DOB: April 27, 1955 DOA: 10/17/2020 PCP: Garwin Brothers, MD    Chief Complaint  Patient presents with   Respiratory Distress    Brief Narrative:  Sonia Drake is a 65 year old female with past medical history significant for cardiac arrest 8/2021with subsequent anoxic brain injury s/p PEG tube and trach, DM2, history of chronic diastolic congestive heart failure who presented to [facility] ED on 6/12 with worsening respiratory failure from SNF.  Patient previously was ventilator dependent but had been weaned from her trach.  Patient was admitted to the PCCM service underwent bronchoscopy on 10/05/2020 which revealed dynamic collapse of her trachea and subsequently her tracheostomy was replaced.  Remains dependent on ventilator support and poorly interactive.  Patient was seen by palliative care on 10/13/2020 with plans of no de-escalation of care per POA. Marland Kitchen Assessment & Plan:   Principal Problem:   Sepsis ([place]) Active Problems:   COPD (chronic obstructive pulmonary disease) (HCC)   Chronic diastolic CHF (congestive heart failure) (HCC)   Acute on chronic respiratory failure with hypoxia (HCC)   GERD (gastroesophageal reflux disease)   Seizures (HCC)   Type 2 diabetes mellitus without complication, with long-term current use of insulin (HCC)   Severe hypoxic-ischemic encephalopathy   Tracheostomy dependence ([place])   Pressure injury of skin   Sepsis ruled out.  Acute on chronic hypoxic respiratory failure:  Probably secondary to anoxic brain injury with dynamic airway collapse noted on bronchoscopy on 10/05/2020 Patient continues to remain vent dependent. Repeat bronchoscopy on 11/24/2020 showed tracheal/bronchial malacia with complete dynamic collapse of the right mainstem bronchus. Repeat bronchoscopy on 11/25/2020 showed airway occlusion with findings of migrated tracheostomy which was repositioned with resolution of airway occlusion Patient currently  on bronchodilators, albuterol as needed and IV fentanyl for vent dyssynchrony Pulmonary toilet PCCM continues to follow and appreciate recommendations.    Cardiac arrest on 11/2019 with resultant severe anoxic brain injury Patient remains nonverbal, unresponsive, bedbound and in vegetative state S/p tracheostomy and PEG tube Neurology suggest patient does not appear to have meaningful recovery. Daughter refused to have any conversations with palliative care at this time    Seizure disorder in the setting of anoxic brain injury Continue with Keppra, valproic acid, clonazepam and lorazepam as needed.   Insulin-dependent diabetes mellitus Continue with Lantus and sliding scale insulin.   Anemia of chronic disease Hemoglobin stable around 10.    History of CVA Continue with Plavix.   Chronic diastolic heart failure Patient appears to be compensated at this time and on vent support     DVT prophylaxis: (Lovenox/) Code Status: (Full code) Family Communication: none at bedside.  Disposition:   Status is: Inpatient  Remains inpatient appropriate because:Unsafe d/c plan  Dispo: The patient is from: SNF              Anticipated d/c is to: SNF              Patient currently is medically stable to d/c.   Difficult to place patient Yes       Consultants:  PCCM Neurology Palliative care Infectious disease, Dr. Linus Salmons  Procedures:  EEG 6/26 - 6/28 Bronchoscopy on 11/24/2020 showed tracheal/bronchial malacia with complete dynamic collapse of the right mainstem bronchus was observed.   Bronchoscopy 8/6 for airway occlusion with findings of migrated tracheostomy which was repositioned with resolution of airway occlusion.  Antimicrobials:   Vancomycin 6/12 - 6/15, 6/18 - 6/20 Cefepime 6/12 - 6/15,  6/18 - 6/18 Metronidazole 6/12 - 6/15   Subjective: Intermittent episodes o hypoxia secondary to tracheal collapse . Repositioning of the trach, requiring BVM. And IV  fentanyl yesterday.   Objective: Vitals:   11/29/20 0747 11/29/20 0755 11/29/20 0800 11/29/20 0900  BP:   (!) 79/54 (!) 99/58  Pulse:   78 70  Resp:   16 16  Temp:  98.2 F (36.8 C)    TempSrc:  Oral    SpO2: 97%  97% 99%  Weight:      Height:        Intake/Output Summary (Last 24 hours) at 11/29/2020 1038 Last data filed at 11/29/2020 0900 Gross per 24 hour  Intake 495 ml  Output 1100 ml  Net -605 ml   Filed Weights   11/27/20 0500 11/28/20 0449 11/29/20 0500  Weight: 88.7 kg 88.7 kg 88.7 kg    Examination:  General exam: Ill-appearing, bedbound on vent support Respiratory system: Clear to auscultation.  No wheezing or rhonchi Cardiovascular system: S1 & S2 heard, RRR. No JVD,  No pedal edema. Gastrointestinal system: Abdomen is nondistended, soft and nontender. Normal bowel sounds heard. Central nervous system: Alert, does not respond to verbal cues, not following commands Extremities: No edema Skin: No no rashes seen Psychiatry: Cannot be assessed    Data Reviewed: I have personally reviewed following labs and imaging studies  CBC: Recent Labs  Lab 11/29/20 0816  WBC 9.1  HGB 10.2*  HCT 32.9*  MCV 88.2  PLT 992    Basic Metabolic Panel: Recent Labs  Lab 11/29/20 0816  NA 140  K 3.8  CL 105  CO2 28  GLUCOSE 184*  BUN 28*  CREATININE 0.47  CALCIUM 10.2    GFR: Estimated Creatinine Clearance: 71.5 mL/min (by C-G formula based on SCr of 0.47 mg/dL).  Liver Function Tests: No results for input(s): AST, ALT, ALKPHOS, BILITOT, PROT, ALBUMIN in the last 168 hours.  CBG: Recent Labs  Lab 11/28/20 1602 11/28/20 1948 11/29/20 0055 11/29/20 0451 11/29/20 0755  GLUCAP 171* 222* 195* 141* 153*     No results found for this or any previous visit (from the past 240 hour(s)).       Radiology Studies: No results found.      Scheduled Meds:  arformoterol  15 mcg Nebulization BID   baclofen  5 mg Per Tube TID   budesonide  0.5 mg  Nebulization BID   chlorhexidine gluconate (MEDLINE KIT)  15 mL Mouth Rinse BID   Chlorhexidine Gluconate Cloth  6 each Topical Daily   clonazePAM  0.5 mg Per Tube BID   clopidogrel  75 mg Per Tube Daily   enoxaparin (LOVENOX) injection  0.5 mg/kg Subcutaneous Q24H   famotidine  20 mg Per Tube BID   feeding supplement (PROSource TF)  45 mL Per Tube BID   fiber  1 packet Per Tube BID   Gerhardt's butt cream   Topical BID   guaiFENesin  5 mL Per Tube Q6H   hydrocortisone cream   Topical TID   insulin aspart  0-15 Units Subcutaneous Q4H   insulin glargine  40 Units Subcutaneous QHS   levETIRAcetam  2,500 mg Per Tube BID   loratadine  10 mg Per Tube Daily   mouth rinse  15 mL Mouth Rinse 10 times per day   montelukast  10 mg Per Tube Daily   nutrition supplement (JUVEN)  1 packet Per Tube BID   predniSONE  40 mg Per Tube Q  breakfast   revefenacin  175 mcg Nebulization Daily   tiZANidine  2 mg Per Tube QHS   valproic acid  1,000 mg Per Tube Q12H   Continuous Infusions:  sodium chloride     feeding supplement (JEVITY 1.5 CAL/FIBER) 1,000 mL (11/29/20 0649)     LOS: 28 days        Hosie Poisson, MD [facility]   To contact the attending provider between 7A-7P or the covering provider during after hours 7P-7A, please log into the web site [email or web address] and access using universal [place] password for that web site. If you do not have the password, please call the hospital operator.  11/29/2020, 10:38 AM

## 2020-11-29 NOTE — Progress Notes (Signed)
NAME:  Sonia Drake, MRN:  [id number], DOB:  May 06, 1955, LOS: 59 ADMISSION DATE:  2020-10-28, CONSULTATION DATE:  6/15 REFERRING MD:  Thedore Mins, CHIEF COMPLAINT:  Dyspnea   History of Present Illness:  65 y/o female with anoxic brain injury, tracheostomy status who was admitted from a SNF on 6/12 with worsening respiratory failure.  She had previously been ventilator dependent but had been weaned from the trach.  On 6/16 a bronchoscopy revealed dynamic collapse of her trachea, tracheostomy was replaced.  Later required mechanical ventilation.  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 No distress. WBC ct trending down. No events overnight growing GPC clusters in two of two cultures  6/16 increased work of breathing, upper airway noise, abdominal muscle use.  Bronchoscopy performed as trach exchange cuffless #8 Shiley XLT showed dynamic airway collapse, tracheal collapse. 6/16 continued respiratory distress moved to ICU, changed to cuffed #6 Shiley XLT, placed on MV with some improved comfort 6/12 cefepime > 6/14, 6/18 6/12 vanc > 6/14, 6/18 6/23 mucus plugging, severe vent dyssynchrony 6/24-25 intermittent jerking, seizures on EEG 6/27 weaned off propofol gtt 6/28 briefly tolerated trach collar in AM 6/29 tolerated trach collar in AM; no further seizure like activity; working with CM/SW for vent-SNF 7/1 Weaning on CPAP/ PS 10/5, CM SW working on [employer], NO seizure activity, secretions are not an issue per  nursing 7/11 trach changed to #8 Bivona with air cuff, much improved. 7/12 TCT 12 hours; rested on Vent overnight 7/13 on TCT 7/23 Trach collar tolerated 7/24 Full vent support 7/26>> Remains on full vent support this am, no issues with secretions per nursing 8/5 occluding trach with dynamic collapse: seen on bronchoscopy 8/6 called back emergently to bedside for tracheostomy occlusion: emergent bronch, bivona trach migrated back 2cm, improved with advancing to 2cm above carina 8/8 collapse caused hypoxia. Improved with repositioning  8/9 afternoon had another episode of tracheal collapse and occlusion of trach requiring BVM, sedation. Family produced paperwork which seems to have conflicting information about pts wishes   Interim History / Subjective:   NAEO   Objective   Blood pressure 116/87, pulse 98, temperature 98.2 F (36.8 C), temperature source Oral, resp. rate 18, height 5\' 2"  (1.575 m), weight 88.7 kg, SpO2 97 %.    Vent Mode: PRVC FiO2 (%):  [40 %] 40 % Set Rate:  [16 bmp] 16 bmp Vt Set:  [400 mL] 400 mL PEEP:  [10 cmH20] 10 cmH20 Plateau Pressure:  [16 cmH20-19 cmH20] 19 cmH20   Intake/Output Summary (Last 24 hours) at 11/29/2020 0902 Last data filed at 11/29/2020 0600 Gross per 24 hour  Intake 405 ml  Output 1100 ml  Net -695 ml   Filed Weights   11/27/20 0500 11/28/20 0449 11/29/20 0500  Weight: 88.7 kg 88.7 kg 88.7 kg    Examination:  General:  Chronically ill appearing older adult F trach/vent NAD  HENT: NCAT pink mm Bivona trach.  PULM: Symmetrical chest expansion even unlabored fully mechanically supported  CV: rrr s1s2 no rgm  GI: soft ndnt + bowel sounds  GU: purewick  MSK: no acute  joint deformity no cyanosis or clubbing   Neuro: Persistent minimally conscious state. Eyes are open spontaneously. Does not follow commands   Resolved Hospital Problem list     Assessment & Plan:   Anoxic encephalopathy / anoxic brain injury Acute on chronic  respiratory failure w hypercarbia and hypoxia  Severe tracheobronchomalacia Trach status Hx COPD  -desats with hyperdynamic collapse of airway -etiology of trach occlusion, dynamic airway collapse. Initially improved with incr PEEP and trach repositioning but 8/8 still recurring. Associated severe dyssynchrony  Plan: PRVC PEEP 10 Bivona trach at 14 (tube is marked at correct position)-- though do wonder if alt trach would be worth a try as this one is so flexible it seems to move when pt has dynamic occlusion  PRN fent   Goals of Care:  Continuing GOC talks. Family has produced some paperwork (copy in chart) that presents some conflicting information about the pts wishes. Independent of the this conflicting information, underlying prognosis for meaningful recovery is poor with anoxic injury. Chronic state further complicated by severe respiratory condition with dynamically collapsing airway with severe tracheobronchomalacia, trach/vent    Ethics consult appropriate   Best Practice (right click and "Reselect all SmartList Selections" daily)   Diet/type: tubefeeds DVT prophylaxis: LMWH GI prophylaxis: H2B Lines: N/A Foley:  N/A Code Status:  full code Last date of multidisciplinary goals of care discussion [ongoing. Updated by PCCM 8/9, TRH ongoing. Pal care s/o previously. Ethics consult pending]  Labs   CBC: No results for input(s): WBC, NEUTROABS, HGB, HCT, MCV, PLT in the last 168 hours.   Basic Metabolic Panel: No results for input(s): NA, K, CL, CO2, GLUCOSE, BUN, CREATININE, CALCIUM, MG, PHOS in the last 168 hours.  GFR: Estimated Creatinine Clearance: 71.5 mL/min (by C-G formula based on SCr of 0.44 mg/dL). No results for input(s): PROCALCITON, WBC, LATICACIDVEN in the last 168 hours.   Liver Function Tests: No results for input(s): AST, ALT, ALKPHOS, BILITOT, PROT, ALBUMIN in the last 168 hours. No results for input(s): LIPASE, AMYLASE in the last 168 hours. No  results for input(s): AMMONIA in the last 168 hours.  ABG    Component Value Date/Time   PHART 7.414 11/20/2020 1253   PCO2ART 52.3 (H) 11/20/2020 1253   PO2ART 127 (H) 11/20/2020 1253   HCO3 33.6 (H) 11/20/2020 1253   TCO2 35 (H) 11/20/2020 1253   O2SAT 99.0 11/20/2020 1253     Coagulation Profile: No results for input(s): INR, PROTIME in the last 168 hours.   Cardiac Enzymes: No results for input(s): CKTOTAL, CKMB, CKMBINDEX, TROPONINI in the last 168 hours.  HbA1C: Hgb A1c MFr Bld  Date/Time Value Ref Range Status  10-23-2020 06:31 PM 7.0 (H) 4.8 - 5.6 % Final    Comment:    (NOTE)         Prediabetes: 5.7 - 6.4         Diabetes: >6.4         Glycemic control for adults with diabetes: <7.0   08/30/2019 04:53 AM 6.5 (H) 4.8 - 5.6 % Final    Comment:    (NOTE) Pre diabetes:          5.7%-6.4% Diabetes:              >6.4% Glycemic control for   <7.0% adults with diabetes     CBG: Recent Labs  Lab 11/28/20 1602 11/28/20 1948 11/29/20 0055 11/29/20 0451 11/29/20 0755  GLUCAP 171* 222* 195* 141* 153*    CRITICAL  CARE Performed by: Lanier Clam   Total critical care time: 33 minutes  Critical care time was exclusive of separately billable procedures and treating other patients. Critical care was necessary to treat or prevent imminent or life-threatening deterioration.  Critical care was time spent personally by me on the following activities: development of treatment plan with patient and/or surrogate as well as nursing, discussions with consultants, evaluation of patient's response to treatment, examination of patient, obtaining history from patient or surrogate, ordering and performing treatments and interventions, ordering and review of laboratory studies, ordering and review of radiographic studies, pulse oximetry and re-evaluation of patient's condition.  Tessie Fass MSN, AGACNP-BC [facility] Pulmonary/Critical Care Medicine Amion for pager   11/29/2020, 9:02 AM

## 2020-11-29 NOTE — Progress Notes (Signed)
RT NOTE: patient does not meet SBT criteria for this AM due to PEEP requirements.  Tolerating current ventilator settings well at this time.  RT will continue to monitor.

## 2020-11-30 DIAGNOSIS — A419 Sepsis, unspecified organism: Secondary | ICD-10-CM | POA: Diagnosis not present

## 2020-11-30 DIAGNOSIS — J9621 Acute and chronic respiratory failure with hypoxia: Secondary | ICD-10-CM | POA: Diagnosis not present

## 2020-11-30 LAB — GLUCOSE, CAPILLARY
Glucose-Capillary: 168 mg/dL — ABNORMAL HIGH (ref 70–99)
Glucose-Capillary: 184 mg/dL — ABNORMAL HIGH (ref 70–99)
Glucose-Capillary: 244 mg/dL — ABNORMAL HIGH (ref 70–99)
Glucose-Capillary: 316 mg/dL — ABNORMAL HIGH (ref 70–99)
Glucose-Capillary: 257 mg/dL — ABNORMAL HIGH (ref 70–99)
Glucose-Capillary: 195 mg/dL — ABNORMAL HIGH (ref 70–99)

## 2020-11-30 NOTE — Progress Notes (Signed)
CSW faxed patient's clinicals to Clydie Braun at [facility] for review.  [name], MSW, LCSW Transitions of Care  Clinical Social Worker II [phone]

## 2020-11-30 NOTE — Progress Notes (Signed)
Patient PRN Fentanyl discontinued as patient is no longer requiring any Fentanyl.

## 2020-11-30 NOTE — Progress Notes (Signed)
PROGRESS NOTE    Sonia Drake  QGB:[id number] DOB: February 06, 1956 DOA: 09/24/2020 PCP: Garwin Brothers, MD    Chief Complaint  Patient presents with   Respiratory Distress    Brief Narrative:  Sonia Drake is a 65 year old female with past medical history significant for cardiac arrest 8/2021with subsequent anoxic brain injury s/p PEG tube and trach, DM2, history of chronic diastolic congestive heart failure who presented to [facility] ED on 6/12 with worsening respiratory failure from SNF.  Patient previously was ventilator dependent but had been weaned from her trach.  Patient was admitted to the PCCM service underwent bronchoscopy on 10/05/2020 which revealed dynamic collapse of her trachea and subsequently her tracheostomy was replaced.  Remains dependent on ventilator support and poorly interactive.  Patient was seen by palliative care on 10/13/2020 with plans of no de-escalation of care per POA. . Daughter to discuss with the rest of the family and to get back to [name] regarding the code status.  Assessment & Plan:   Principal Problem:   Sepsis ([place]) Active Problems:   COPD (chronic obstructive pulmonary disease) (HCC)   Chronic diastolic CHF (congestive heart failure) (HCC)   Acute on chronic respiratory failure with hypoxia (HCC)   GERD (gastroesophageal reflux disease)   Seizures (HCC)   Type 2 diabetes mellitus without complication, with long-term current use of insulin (HCC)   Severe hypoxic-ischemic encephalopathy   Tracheostomy dependence ([place])   Pressure injury of skin   Sepsis ruled out.  Acute on chronic hypoxic respiratory failure:  Probably secondary to anoxic brain injury with dynamic airway collapse noted on bronchoscopy on 10/05/2020 Patient continues to remain vent dependent. No changes in meds.  Repeat bronchoscopy on 11/24/2020 showed tracheal/bronchial malacia with complete dynamic collapse of the right mainstem bronchus. Repeat bronchoscopy on 11/25/2020 showed airway  occlusion with findings of migrated tracheostomy which was repositioned with resolution of airway occlusion Patient currently on bronchodilators, albuterol as needed and IV fentanyl for vent dyssynchrony Pulmonary toilet PCCM continues to follow and appreciate recommendations.    Cardiac arrest on 11/2019 with resultant severe anoxic brain injury Patient remains nonverbal, unresponsive, bedbound and in vegetative state S/p tracheostomy and PEG tube Neurology suggest patient does not appear to have meaningful recovery. Daughter refused to have any conversations with palliative care at this time Daughter to talk to the rest of the family members and get back to [name] regarding code status.     Seizure disorder in the setting of anoxic brain injury Continue with Keppra, valproic acid, clonazepam and lorazepam as needed. No seizures noted int he last 24 hours.    Insulin-dependent diabetes mellitus Continue with Lantus and sliding scale insulin. CBG (last 3)  Recent Labs    11/30/20 0733 11/30/20 1121 11/30/20 1532  GLUCAP 184* 244* 316*      Anemia of chronic disease Hemoglobin stable around 10. Transfuse to keep hemoglobin greater than 7.     History of CVA Continue with Plavix.   Chronic diastolic heart failure Patient appears to be compensated at this time and on vent support     DVT prophylaxis: (Lovenox/) Code Status: (Full code) Family Communication: none at bedside.  Disposition:   Status is: Inpatient  Remains inpatient appropriate because:Unsafe d/c plan  Dispo: The patient is from: SNF              Anticipated d/c is to: SNF              Patient currently is medically stable  to d/c.   Difficult to place patient Yes       Consultants:  PCCM Neurology Palliative care Infectious disease, Dr. Linus Salmons  Procedures:  EEG 6/26 - 6/28 Bronchoscopy on 11/24/2020 showed tracheal/bronchial malacia with complete dynamic collapse of the right mainstem  bronchus was observed.   Bronchoscopy 8/6 for airway occlusion with findings of migrated tracheostomy which was repositioned with resolution of airway occlusion.  Antimicrobials:   Vancomycin 6/12 - 6/15, 6/18 - 6/20 Cefepime 6/12 - 6/15, 6/18 - 6/18 Metronidazole 6/12 - 6/15   Subjective: No events overnight.   Objective: Vitals:   11/30/20 1500 11/30/20 1519 11/30/20 1600 11/30/20 1634  BP:  (!) 75/53 121/71   Pulse: 75  89   Resp: 13  17   Temp:    97.9 F (36.6 C)  TempSrc:    Axillary  SpO2: 98% 100% 97%   Weight:      Height:        Intake/Output Summary (Last 24 hours) at 11/30/2020 1751 Last data filed at 11/30/2020 1600 Gross per 24 hour  Intake --  Output 1700 ml  Net -1700 ml    Filed Weights   11/27/20 0500 11/28/20 0449 11/29/20 0500  Weight: 88.7 kg 88.7 kg 88.7 kg    Examination:  General exam: chronically ill appearing elderly lady bed bound, on vent support. Respiratory system: Air entry fair, no wheezing or rhonchi, on vent support.  Cardiovascular system: RRR no JVD, no pedal edema.  Gastrointestinal system: Abdomen is soft, NT ND ,S/P peg  Central nervous system: Sleeping comfortably, does not follow commands.  Extremities: No pedal edema.  Skin: No rashes seen.  Psychiatry: Cannot be assessed    Data Reviewed: I have personally reviewed following labs and imaging studies  CBC: Recent Labs  Lab 11/29/20 0816  WBC 9.1  HGB 10.2*  HCT 32.9*  MCV 88.2  PLT 288     Basic Metabolic Panel: Recent Labs  Lab 11/29/20 0816  NA 140  K 3.8  CL 105  CO2 28  GLUCOSE 184*  BUN 28*  CREATININE 0.47  CALCIUM 10.2     GFR: Estimated Creatinine Clearance: 71.5 mL/min (by C-G formula based on SCr of 0.47 mg/dL).  Liver Function Tests: No results for input(s): AST, ALT, ALKPHOS, BILITOT, PROT, ALBUMIN in the last 168 hours.  CBG: Recent Labs  Lab 11/29/20 2356 11/30/20 0329 11/30/20 0733 11/30/20 1121 11/30/20 1532  GLUCAP  179* 168* 184* 244* 316*      No results found for this or any previous visit (from the past 240 hour(s)).       Radiology Studies: No results found.      Scheduled Meds:  arformoterol  15 mcg Nebulization BID   baclofen  5 mg Per Tube TID   budesonide  0.5 mg Nebulization BID   chlorhexidine gluconate (MEDLINE KIT)  15 mL Mouth Rinse BID   Chlorhexidine Gluconate Cloth  6 each Topical Daily   clonazePAM  0.5 mg Per Tube BID   clopidogrel  75 mg Per Tube Daily   enoxaparin (LOVENOX) injection  0.5 mg/kg Subcutaneous Q24H   famotidine  20 mg Per Tube BID   feeding supplement (PROSource TF)  45 mL Per Tube BID   fiber  1 packet Per Tube BID   Gerhardt's butt cream   Topical BID   guaiFENesin  5 mL Per Tube Q6H   hydrocortisone cream   Topical TID   insulin aspart  0-15 Units  Subcutaneous Q4H   insulin glargine  40 Units Subcutaneous QHS   levETIRAcetam  2,500 mg Per Tube BID   loratadine  10 mg Per Tube Daily   mouth rinse  15 mL Mouth Rinse 10 times per day   montelukast  10 mg Per Tube Daily   nutrition supplement (JUVEN)  1 packet Per Tube BID   revefenacin  175 mcg Nebulization Daily   tiZANidine  2 mg Per Tube QHS   valproic acid  1,000 mg Per Tube Q12H   Continuous Infusions:  sodium chloride     feeding supplement (JEVITY 1.5 CAL/FIBER) 1,000 mL (11/29/20 0649)     LOS: 60 days        Hosie Poisson, MD [facility]   To contact the attending provider between 7A-7P or the covering provider during after hours 7P-7A, please log into the web site [email or web address] and access using universal  password for that web site. If you do not have the password, please call the hospital operator.  11/30/2020, 5:51 PM

## 2020-11-30 NOTE — Progress Notes (Signed)
NAME:  Sonia Drake, MRN:  [id number], DOB:  1955/06/30, LOS: 60 ADMISSION DATE:  09/29/2020, CONSULTATION DATE:  6/15 REFERRING MD:  Thedore Mins, CHIEF COMPLAINT:  Dyspnea   History of Present Illness:  65 y/o female with anoxic brain injury, tracheostomy status who was admitted from a SNF on 6/12 with worsening respiratory failure.  She had previously been ventilator dependent but had been weaned from the trach.  On 6/16 a bronchoscopy revealed dynamic collapse of her trachea, tracheostomy was replaced.  Later required mechanical ventilation.  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 No distress. WBC ct trending down. No events overnight growing GPC clusters in two of two cultures  6/16 increased work of breathing, upper airway noise, abdominal muscle use.  Bronchoscopy performed as trach exchange cuffless #8 Shiley XLT showed dynamic airway collapse, tracheal collapse. 6/16 continued respiratory distress moved to ICU, changed to cuffed #6 Shiley XLT, placed on MV with some improved comfort 6/12 cefepime > 6/14, 6/18 6/12 vanc > 6/14, 6/18 6/23 mucus plugging, severe vent dyssynchrony 6/24-25 intermittent jerking, seizures on EEG 6/27 weaned off propofol gtt 6/28 briefly tolerated trach collar in AM 6/29 tolerated trach collar in AM; no further seizure like activity; working with CM/SW for vent-SNF 7/1 Weaning on CPAP/ PS 10/5, CM SW working on [employer], NO seizure activity, secretions are not an issue per  nursing 7/11 trach changed to #8 Bivona with air cuff, much improved. 7/12 TCT 12 hours; rested on Vent overnight 7/13 on TCT 7/23 Trach collar tolerated 7/24 Full vent support 7/26>> Remains on full vent support this am, no issues with secretions per nursing 8/5 occluding trach with dynamic collapse: seen on bronchoscopy 8/6 called back emergently to bedside for tracheostomy occlusion: emergent bronch, bivona trach migrated back 2cm, improved with advancing to 2cm above carina 8/8 collapse caused hypoxia. Improved with repositioning  8/9 afternoon had another episode of tracheal collapse and occlusion of trach requiring BVM, sedation. Family produced paperwork which seems to have conflicting information about pts wishes  8/11: pt continues to have multiple daily episodes of tracheal collapse/mucous plugging req lavage, BVM. Repeated discussions with family re: code status esp in light of papwerwork  Interim History / Subjective:   8/11: cont to have multiple episodes daily of req bvm and lavage 2/2 hypoxia from tracheal collapse/mucous plugging. Lengthy and repetitive d/w daughter and tamara about code status again as ashanti indicated Tuesday she would meet with family and return with answer wed. However today she stated that she would likely not be able to meet with family until mon/tues. I again explained that should pt suffer a cardiac arrest it would be most likely 2/2 hypoxia as she continues to have these episodes req additional support. As such it would continue to happen and pt at baseline is bedbound, on vent and not interactive but does open eyes. In light of inability to correct/reverse the cause of her repeated hypoxic episodes should she suffer a cardiac arrest it would be futile to perform cpr.    Objective   Blood pressure (!) 85/47, pulse 76, temperature 98.3 F (36.8  C), temperature source Axillary, resp. rate 16, height 5\' 2"  (1.575 m), weight 88.7 kg, SpO2 99 %.    Vent  Mode: PRVC FiO2 (%):  [40 %] 40 % Set Rate:  [16 bmp] 16 bmp Vt Set:  [400 mL] 400 mL PEEP:  [10 cmH20] 10 cmH20 Plateau Pressure:  [13 cmH20-21 cmH20] 20 cmH20   Intake/Output Summary (Last 24 hours) at 11/30/2020 1237 Last data filed at 11/30/2020 0800 Gross per 24 hour  Intake 240 ml  Output 1325 ml  Net -1085 ml   Filed Weights   11/27/20 0500 11/28/20 0449 11/29/20 0500  Weight: 88.7 kg 88.7 kg 88.7 kg    Examination:  General:  Chronically ill appearing older adult F trach/vent NAD  HENT: NCAT pink mm Bivona trach.  PULM: Symmetrical chest expansion even unlabored fully mechanically supported  CV: rrr s1s2 no rgm  GI: soft ndnt + bowel sounds  GU: purewick  MSK: no acute joint deformity no cyanosis or clubbing   Neuro: Persistent minimally conscious state. Eyes are open spontaneously. Does not follow commands   Resolved Hospital Problem list     Assessment & Plan:   Anoxic encephalopathy / anoxic brain injury Acute on chronic respiratory failure w hypercarbia and hypoxia  Severe tracheobronchomalacia Trach status Hx COPD  -desats with hyperdynamic collapse of airway -etiology of trach occlusion, dynamic airway collapse. Initially improved with incr PEEP and trach repositioning but 8/8 still recurring. Associated severe dyssynchrony  Plan: Pt is vent dependent 2/2 severe tracheomalacia Bivona trach at 14 (tube is marked at correct position)--  Goals of Care:  Continuing GOC talks. Family has produced some paperwork (copy in chart) that presents some conflicting information about the pts wishes. Independent of the this conflicting information, underlying prognosis for meaningful recovery is poor with anoxic injury. Chronic state further complicated by severe respiratory condition with dynamically collapsing airway with severe tracheobronchomalacia, trach/vent     Best Practice (right click and "Reselect all SmartList Selections" daily)   Diet/type:  tubefeeds DVT prophylaxis: LMWH GI prophylaxis: H2B Lines: N/A Foley:  N/A Code Status:  full code Last date of multidisciplinary goals of care discussion [ongoing. Updated by [facility] 8/11, TRH ongoing. Pal care s/o previously. Ethics consult pending]  Labs   CBC: Recent Labs  Lab 11/29/20 0816  WBC 9.1  HGB 10.2*  HCT 32.9*  MCV 88.2  PLT 288     Basic Metabolic Panel: Recent Labs  Lab 11/29/20 0816  NA 140  K 3.8  CL 105  CO2 28  GLUCOSE 184*  BUN 28*  CREATININE 0.47  CALCIUM 10.2    GFR: Estimated Creatinine Clearance: 71.5 mL/min (by C-G formula based on SCr of 0.47 mg/dL). Recent Labs  Lab 11/29/20 0816  WBC 9.1     Liver Function Tests: No results for input(s): AST, ALT, ALKPHOS, BILITOT, PROT, ALBUMIN in the last 168 hours. No results for input(s): LIPASE, AMYLASE in the last 168 hours. No results for input(s): AMMONIA in the last 168 hours.  ABG    Component Value Date/Time   PHART 7.414 11/20/2020 1253   PCO2ART 52.3 (H) 11/20/2020 1253   PO2ART 127 (H) 11/20/2020 1253   HCO3 33.6 (H) 11/20/2020 1253   TCO2 35 (H) 11/20/2020 1253   O2SAT 99.0 11/20/2020 1253     Coagulation Profile: No results for input(s): INR, PROTIME in the last 168 hours.   Cardiac Enzymes: No results for input(s): CKTOTAL, CKMB, CKMBINDEX, TROPONINI in the last 168 hours.  HbA1C: Hgb A1c MFr Bld  Date/Time Value Ref Range Status  10/02/2020 06:31 PM 7.0 (H) 4.8 - 5.6 % Final    Comment:    (NOTE)         Prediabetes: 5.7 - 6.4         Diabetes: >6.4         Glycemic control for adults with diabetes: <7.0   08/30/2019 04:53 AM 6.5 (H) 4.8 - 5.6 % Final    Comment:    (NOTE) Pre diabetes:          5.7%-6.4% Diabetes:              >6.4% Glycemic control for   <7.0% adults with diabetes     CBG: Recent Labs  Lab 11/29/20 1935 11/29/20 2356 11/30/20 0329 11/30/20 0733 11/30/20 1121  GLUCAP 258* 179* 168* 184* 244*    CRITICAL CARE Performed by:  Briant Sites   Critical care time: The patient is critically ill with multiple organ systems failure and requires high complexity decision making for assessment and support, frequent evaluation and titration of therapies, application of advanced monitoring technologies and extensive interpretation of multiple databases.  Critical care time 36 mins. This represents my time independent of the NPs time taking care of the pt. This is excluding procedures.    Briant Sites DO [facility] 11/30/2020, 1:15 PM See Amion for pager If no response to pager, please call [phone] until 1900 After 1900 please call [facility] [phone]

## 2020-12-01 DIAGNOSIS — J9621 Acute and chronic respiratory failure with hypoxia: Secondary | ICD-10-CM | POA: Diagnosis not present

## 2020-12-01 LAB — GLUCOSE, CAPILLARY
Glucose-Capillary: 133 mg/dL — ABNORMAL HIGH (ref 70–99)
Glucose-Capillary: 144 mg/dL — ABNORMAL HIGH (ref 70–99)
Glucose-Capillary: 148 mg/dL — ABNORMAL HIGH (ref 70–99)
Glucose-Capillary: 150 mg/dL — ABNORMAL HIGH (ref 70–99)
Glucose-Capillary: 151 mg/dL — ABNORMAL HIGH (ref 70–99)
Glucose-Capillary: 218 mg/dL — ABNORMAL HIGH (ref 70–99)

## 2020-12-01 MED ORDER — METOPROLOL TARTRATE 5 MG/5ML IV SOLN
2.5000 mg | Freq: Three times a day (TID) | INTRAVENOUS | Status: DC | PRN
  Administered 2020-12-08 – 2021-01-23 (×16): 2.5 mg via INTRAVENOUS
  Filled 2020-12-01 (×19): qty 5

## 2020-12-01 NOTE — Progress Notes (Addendum)
10:30am: CSW spoke with Tammy at [facility] who states the patient has been accepted into the facility. Tammy states the facility will have to contact family to obtain documentation needed to apply for [place] prior to her discharging to the facility. CSW provided Tammy with contact information for patient's daughter [name] and niece Jodene Nam. CSW will faxed updated clinicals to [phone] for review. The facility cannot accept the patient until documentation is obtained from family and reviewed by business office.  8:30am: CSW attempted to reach Clydie Braun at [facility] - no answer, no voicemail option available.  CSW attempted to reach Tammy in admissions at [facility] in [place], [place] - no answer, a voicemail was left requesting a return call.  [name], MSW, LCSW Transitions of Care  Clinical Social Worker II [phone]

## 2020-12-01 NOTE — Progress Notes (Signed)
PROGRESS NOTE    Sonia Drake  HYW:[id number] DOB: Dec 30, 1955 DOA: 10/08/2020 PCP: Garwin Brothers, MD    Chief Complaint  Patient presents with   Respiratory Distress    Brief Narrative:  Sonia Drake is a 65 year old female with past medical history significant for cardiac arrest 8/2021with subsequent anoxic brain injury s/p PEG tube and trach, DM2, history of chronic diastolic congestive heart failure who presented to [facility] ED on 6/12 with worsening respiratory failure from SNF.  Patient previously was ventilator dependent but had been weaned from her trach.  Patient was admitted to the PCCM service underwent bronchoscopy on 10/05/2020 which revealed dynamic collapse of her trachea and subsequently her tracheostomy was replaced.  Remains dependent on ventilator support and poorly interactive.  Patient was seen by palliative care on 10/13/2020 with plans of no de-escalation of care per POA. . Daughter to discuss with the rest of the family and to get back to [name] regarding the code status.  Pt has been accepted in to the facility at [facility]. Facility is trying reach family for documentation needed to apply for [place] prior to discharging to the facility.   Assessment & Plan:   Principal Problem:   Sepsis ([place]) Active Problems:   COPD (chronic obstructive pulmonary disease) (HCC)   Chronic diastolic CHF (congestive heart failure) (HCC)   Acute on chronic respiratory failure with hypoxia (HCC)   GERD (gastroesophageal reflux disease)   Seizures (HCC)   Type 2 diabetes mellitus without complication, with long-term current use of insulin (HCC)   Severe hypoxic-ischemic encephalopathy   Tracheostomy dependence ([place])   Pressure injury of skin   Sepsis ruled out.  Acute on chronic hypoxic respiratory failure:  Probably secondary to anoxic brain injury with dynamic airway collapse noted on bronchoscopy on 10/05/2020 Patient continues to remain vent dependent. No changes in meds.   Repeat bronchoscopy on 11/24/2020 showed tracheal/bronchial malacia with complete dynamic collapse of the right mainstem bronchus. Repeat bronchoscopy on 11/25/2020 showed airway occlusion with findings of migrated tracheostomy which was repositioned with resolution of airway occlusion Patient currently on bronchodilators, albuterol as needed and IV fentanyl for vent dyssynchrony Pulmonary toilet PCCM continues to follow and appreciate recommendations.    Cardiac arrest on 11/2019 with resultant severe anoxic brain injury Patient remains nonverbal, unresponsive, bedbound and in vegetative state S/p tracheostomy and PEG tube Neurology suggest patient does not appear to have meaningful recovery. Daughter refused to have any conversations with palliative care at this time Daughter to talk to the rest of the family members and get back to [name] regarding code status.     Seizure disorder in the setting of anoxic brain injury Continue with Keppra, valproic acid, clonazepam and lorazepam as needed. No Seizures in the next 24 hours.    Insulin-dependent diabetes mellitus Continue with Lantus and sliding scale insulin. CBG (last 3)  Recent Labs    12/01/20 0334 12/01/20 0741 12/01/20 1133  GLUCAP 148* 151* 144*   No changes in medications.     Anemia of chronic disease Hemoglobin stable around 10. Transfuse to keep hemoglobin greater than 7.     History of CVA Continue with Plavix.   Chronic diastolic heart failure Patient appears to be compensated at this time and on vent support  Sinus tachycardia:  Pt does nt appear to be in distress.  Prn metoprolol ordered. Afebrile and rest of the vs wnl.    DVT prophylaxis: (Lovenox/) Code Status: (Full code) Family Communication: none at  bedside.  Disposition:   Status is: Inpatient  Remains inpatient appropriate because:Unsafe d/c plan  Dispo: The patient is from: SNF              Anticipated d/c is to: SNF               Patient currently is medically stable to d/c.   Difficult to place patient Yes       Consultants:  PCCM Neurology Palliative care Infectious disease, Dr. Linus Salmons  Procedures:  EEG 6/26 - 6/28 Bronchoscopy on 11/24/2020 showed tracheal/bronchial malacia with complete dynamic collapse of the right mainstem bronchus was observed.   Bronchoscopy 8/6 for airway occlusion with findings of migrated tracheostomy which was repositioned with resolution of airway occlusion.  Antimicrobials:   Vancomycin 6/12 - 6/15, 6/18 - 6/20 Cefepime 6/12 - 6/15, 6/18 - 6/18 Metronidazole 6/12 - 6/15   Subjective: Tachycardic, no new events overnight.   Objective: Vitals:   12/01/20 1200 12/01/20 1400 12/01/20 1458 12/01/20 1500  BP: (!) 131/49 129/65 129/65   Pulse: 90 (!) 104  (!) 130  Resp: 18 20  (!) 21  Temp:      TempSrc:      SpO2: 99% 98% 98% 99%  Weight:      Height:        Intake/Output Summary (Last 24 hours) at 12/01/2020 1617 Last data filed at 12/01/2020 1200 Gross per 24 hour  Intake --  Output 850 ml  Net -850 ml    Filed Weights   11/27/20 0500 11/28/20 0449 11/29/20 0500  Weight: 88.7 kg 88.7 kg 88.7 kg    Examination:  General exam: chronically ill appearing, lady on vent support, not in distress.  Respiratory system: air entry fair, on vent support.  Cardiovascular system: tachycardic, S!S2, no JVD, no pedal edema.  Gastrointestinal system: Abdomen is soft, non tender non distended, bowel sounds wnl. S/p PEG in place.  Central nervous system: alert, is not tracking , does not follow commands.  Extremities: No pedal edema.  Skin: No rashes seen.  Psychiatry: Cannot be assessed    Data Reviewed: I have personally reviewed following labs and imaging studies  CBC: Recent Labs  Lab 11/29/20 0816  WBC 9.1  HGB 10.2*  HCT 32.9*  MCV 88.2  PLT 288     Basic Metabolic Panel: Recent Labs  Lab 11/29/20 0816  NA 140  K 3.8  CL 105  CO2 28  GLUCOSE 184*   BUN 28*  CREATININE 0.47  CALCIUM 10.2     GFR: Estimated Creatinine Clearance: 71.5 mL/min (by C-G formula based on SCr of 0.47 mg/dL).  Liver Function Tests: No results for input(s): AST, ALT, ALKPHOS, BILITOT, PROT, ALBUMIN in the last 168 hours.  CBG: Recent Labs  Lab 11/30/20 1937 11/30/20 2324 12/01/20 0334 12/01/20 0741 12/01/20 1133  GLUCAP 257* 195* 148* 151* 144*      No results found for this or any previous visit (from the past 240 hour(s)).       Radiology Studies: No results found.      Scheduled Meds:  arformoterol  15 mcg Nebulization BID   baclofen  5 mg Per Tube TID   budesonide  0.5 mg Nebulization BID   chlorhexidine gluconate (MEDLINE KIT)  15 mL Mouth Rinse BID   Chlorhexidine Gluconate Cloth  6 each Topical Daily   clonazePAM  0.5 mg Per Tube BID   clopidogrel  75 mg Per Tube Daily   enoxaparin (LOVENOX) injection  0.5 mg/kg Subcutaneous Q24H   famotidine  20 mg Per Tube BID   feeding supplement (PROSource TF)  45 mL Per Tube BID   fiber  1 packet Per Tube BID   Gerhardt's butt cream   Topical BID   guaiFENesin  5 mL Per Tube Q6H   hydrocortisone cream   Topical TID   insulin aspart  0-15 Units Subcutaneous Q4H   insulin glargine  40 Units Subcutaneous QHS   levETIRAcetam  2,500 mg Per Tube BID   loratadine  10 mg Per Tube Daily   mouth rinse  15 mL Mouth Rinse 10 times per day   montelukast  10 mg Per Tube Daily   nutrition supplement (JUVEN)  1 packet Per Tube BID   revefenacin  175 mcg Nebulization Daily   tiZANidine  2 mg Per Tube QHS   valproic acid  1,000 mg Per Tube Q12H   Continuous Infusions:  sodium chloride     feeding supplement (JEVITY 1.5 CAL/FIBER) 1,000 mL (11/29/20 0649)     LOS: 61 days        Hosie Poisson, MD [facility]   To contact the attending provider between 7A-7P or the covering provider during after hours 7P-7A, please log into the web site [email or web address] and access using  universal [place] password for that web site. If you do not have the password, please call the hospital operator.  12/01/2020, 4:17 PM

## 2020-12-02 DIAGNOSIS — J9621 Acute and chronic respiratory failure with hypoxia: Secondary | ICD-10-CM | POA: Diagnosis not present

## 2020-12-02 LAB — GLUCOSE, CAPILLARY
Glucose-Capillary: 125 mg/dL — ABNORMAL HIGH (ref 70–99)
Glucose-Capillary: 148 mg/dL — ABNORMAL HIGH (ref 70–99)
Glucose-Capillary: 160 mg/dL — ABNORMAL HIGH (ref 70–99)
Glucose-Capillary: 161 mg/dL — ABNORMAL HIGH (ref 70–99)
Glucose-Capillary: 188 mg/dL — ABNORMAL HIGH (ref 70–99)
Glucose-Capillary: 77 mg/dL (ref 70–99)

## 2020-12-02 NOTE — Progress Notes (Signed)
PROGRESS NOTE    Sonia Drake  VQQ:[id number] DOB: 09/04/1955 DOA: 10/05/2020 PCP: Garwin Brothers, MD    Chief Complaint  Patient presents with   Respiratory Distress    Brief Narrative:  Sonia Drake is a 65 year old female with past medical history significant for cardiac arrest 8/2021with subsequent anoxic brain injury s/p PEG tube and trach, DM2, history of chronic diastolic congestive heart failure who presented to [facility] ED on 6/12 with worsening respiratory failure from SNF.  Patient previously was ventilator dependent but had been weaned from her trach.  Patient was admitted to the PCCM service underwent bronchoscopy on 10/05/2020 which revealed dynamic collapse of her trachea and subsequently her tracheostomy was replaced.  Remains dependent on ventilator support and poorly interactive.  Patient was seen by palliative care on 10/13/2020 with plans of no de-escalation of care per POA. . Daughter to discuss with the rest of the family and to get back to [name] regarding the code status.  Pt has been accepted in to the facility at [facility]. Facility is trying reach family for documentation needed to apply for [place] prior to discharging to the facility.   Assessment & Plan:   Principal Problem:   Sepsis ([place]) Active Problems:   COPD (chronic obstructive pulmonary disease) (HCC)   Chronic diastolic CHF (congestive heart failure) (HCC)   Acute on chronic respiratory failure with hypoxia (HCC)   GERD (gastroesophageal reflux disease)   Seizures (HCC)   Type 2 diabetes mellitus without complication, with long-term current use of insulin (HCC)   Severe hypoxic-ischemic encephalopathy   Tracheostomy dependence ([place])   Pressure injury of skin   Sepsis ruled out.  Acute on chronic hypoxic respiratory failure:  Probably secondary to anoxic brain injury with dynamic airway collapse noted on bronchoscopy on 10/05/2020 Patient continues to remain vent dependent. No changes in meds.   Repeat bronchoscopy on 11/24/2020 showed tracheal/bronchial malacia with complete dynamic collapse of the right mainstem bronchus. Repeat bronchoscopy on 11/25/2020 showed airway occlusion with findings of migrated tracheostomy which was repositioned with resolution of airway occlusion Patient currently on bronchodilators, albuterol as needed and IV fentanyl for vent dyssynchrony Pulmonary toilet PCCM continues to follow and appreciate recommendations. No events overnight.     Cardiac arrest on 11/2019 with resultant severe anoxic brain injury Patient remains nonverbal, unresponsive, bedbound and in vegetative state S/p tracheostomy and PEG tube Neurology suggest patient does not appear to have meaningful recovery. Daughter refused to have any conversations with palliative care at this time Daughter to talk to the rest of the family members and get back to [name] regarding code status.     Seizure disorder in the setting of anoxic brain injury Continue with Keppra, valproic acid, clonazepam and lorazepam as needed. No seizures .    Insulin-dependent diabetes mellitus Continue with Lantus and sliding scale insulin. CBG (last 3)  Recent Labs    12/02/20 0742 12/02/20 1124 12/02/20 1618  GLUCAP 148* 161* 125*   Hemoglobin A1c in 6/22 is 7.     Anemia of chronic disease Hemoglobin stable around 10. Transfuse to keep hemoglobin greater than 7.     History of CVA Continue with Plavix.   Chronic diastolic heart failure Patient appears to be compensated at this time and on vent support  Sinus tachycardia:  Pt does nt appear to be in distress.  Prn metoprolol ordered. Afebrile and rest of the vs wnl.    DVT prophylaxis: (Lovenox/) Code Status: (Full code) Family Communication:  none at bedside.  Disposition:   Status is: Inpatient  Remains inpatient appropriate because:Unsafe d/c plan  Dispo: The patient is from: SNF              Anticipated d/c is to: SNF               Patient currently is medically stable to d/c.   Difficult to place patient Yes       Consultants:  PCCM Neurology Palliative care Infectious disease, Dr. Linus Salmons  Procedures:  EEG 6/26 - 6/28 Bronchoscopy on 11/24/2020 showed tracheal/bronchial malacia with complete dynamic collapse of the right mainstem bronchus was observed.   Bronchoscopy 8/6 for airway occlusion with findings of migrated tracheostomy which was repositioned with resolution of airway occlusion.  Antimicrobials:   Vancomycin 6/12 - 6/15, 6/18 - 6/20 Cefepime 6/12 - 6/15, 6/18 - 6/18 Metronidazole 6/12 - 6/15   Subjective: She appears calm.   Objective: Vitals:   12/02/20 1300 12/02/20 1400 12/02/20 1500 12/02/20 1600  BP: (!) 119/96 (!) 121/47 139/74 (!) 136/42  Pulse: (!) 108 100 (!) 109 (!) 102  Resp: _0 Temp:    98.3 F (36.8 C)  TempSrc:    Oral  SpO2: 99% 99% 99% 100%  Weight:      Height:        Intake/Output Summary (Last 24 hours) at 12/02/2020 1703 Last data filed at 12/02/2020 1500 Gross per 24 hour  Intake 555 ml  Output 1400 ml  Net -845 ml    Filed Weights   11/27/20 0500 11/28/20 0449 11/29/20 0500  Weight: 88.7 kg 88.7 kg 88.7 kg    Examination:  General exam: chronically ill appearing lady , non communicative. Not in distress. S/p trach and PEG  Respiratory system: air entry fair, no wheezing heard.  Cardiovascular system: tachycardic, S1S2, no pedal edema.   Gastrointestinal system: Abdomen is soft, NT ND BS+ S/P PEG placed.  Central nervous system: alert, not following commands, bed bound, non communicative.  Extremities: No pedal edema.  Skin: No rashes seen. Psychiatry: Cannot be assessed    Data Reviewed: I have personally reviewed following labs and imaging studies  CBC: Recent Labs  Lab 11/29/20 0816  WBC 9.1  HGB 10.2*  HCT 32.9*  MCV 88.2  PLT 288     Basic Metabolic Panel: Recent Labs  Lab 11/29/20 0816  NA 140  K 3.8  CL 105   CO2 28  GLUCOSE 184*  BUN 28*  CREATININE 0.47  CALCIUM 10.2     GFR: Estimated Creatinine Clearance: 71.5 mL/min (by C-G formula based on SCr of 0.47 mg/dL).  Liver Function Tests: No results for input(s): AST, ALT, ALKPHOS, BILITOT, PROT, ALBUMIN in the last 168 hours.  CBG: Recent Labs  Lab 12/01/20 2342 12/02/20 0333 12/02/20 0742 12/02/20 1124 12/02/20 1618  GLUCAP 133* 77 148* 161* 125*      No results found for this or any previous visit (from the past 240 hour(s)).       Radiology Studies: No results found.      Scheduled Meds:  arformoterol  15 mcg Nebulization BID   baclofen  5 mg Per Tube TID   budesonide  0.5 mg Nebulization BID   chlorhexidine gluconate (MEDLINE KIT)  15 mL Mouth Rinse BID   Chlorhexidine Gluconate Cloth  6 each Topical Daily   clonazePAM  0.5 mg Per Tube BID   clopidogrel  75 mg Per Tube Daily   enoxaparin (  LOVENOX) injection  0.5 mg/kg Subcutaneous Q24H   famotidine  20 mg Per Tube BID   feeding supplement (PROSource TF)  45 mL Per Tube BID   fiber  1 packet Per Tube BID   Gerhardt's butt cream   Topical BID   guaiFENesin  5 mL Per Tube Q6H   hydrocortisone cream   Topical TID   insulin aspart  0-15 Units Subcutaneous Q4H   insulin glargine  40 Units Subcutaneous QHS   levETIRAcetam  2,500 mg Per Tube BID   loratadine  10 mg Per Tube Daily   mouth rinse  15 mL Mouth Rinse 10 times per day   montelukast  10 mg Per Tube Daily   nutrition supplement (JUVEN)  1 packet Per Tube BID   revefenacin  175 mcg Nebulization Daily   tiZANidine  2 mg Per Tube QHS   valproic acid  1,000 mg Per Tube Q12H   Continuous Infusions:  sodium chloride     feeding supplement (JEVITY 1.5 CAL/FIBER) 1,000 mL (11/29/20 0649)     LOS: 62 days        Hosie Poisson, MD [facility]   To contact the attending provider between 7A-7P or the covering provider during after hours 7P-7A, please log into the web site [email or web address] and  access using universal [place] password for that web site. If you do not have the password, please call the hospital operator.  12/02/2020, 5:03 PM

## 2020-12-02 NOTE — Progress Notes (Signed)
NAME:  Sonia Drake, MRN:  [id number], DOB:  [birth year], LOS: 62 ADMISSION DATE:  10/02/2020, CONSULTATION DATE:  6/15 REFERRING MD:  Thedore Mins, CHIEF COMPLAINT:  Dyspnea   History of Present Illness:  65 y/o female with anoxic brain injury, tracheostomy status who was admitted from a SNF on 6/12 with worsening respiratory failure.  She had previously been ventilator dependent but had been weaned from the trach.  On 6/16 a bronchoscopy revealed dynamic collapse of her trachea, tracheostomy was replaced.  Later required mechanical ventilation.  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 No distress. WBC ct trending down. No events overnight growing GPC clusters in two of two cultures  6/16 increased work of breathing, upper airway noise, abdominal muscle use.  Bronchoscopy performed as trach exchange cuffless #8 Shiley XLT showed dynamic airway collapse, tracheal collapse. 6/16 continued respiratory distress moved to ICU, changed to cuffed #6 Shiley XLT, placed on MV with some improved comfort 6/12 cefepime > 6/14, 6/18 6/12 vanc > 6/14, 6/18 6/23 mucus plugging, severe vent dyssynchrony 6/24-25 intermittent jerking, seizures on EEG 6/27 weaned off propofol gtt 6/28 briefly tolerated trach collar in AM 6/29 tolerated trach collar in AM; no further seizure like activity; working with CM/SW for vent-SNF 7/1 Weaning on CPAP/ PS 10/5, CM SW working on [employer], NO seizure activity, secretions are not an issue per  nursing 7/11 trach changed to #8 Bivona with air cuff, much improved. 7/12 TCT 12 hours; rested on Vent overnight 7/13 on TCT 7/23 Trach collar tolerated 7/24 Full vent support 7/26>> Remains on full vent support this am, no issues with secretions per nursing 8/5 occluding trach with dynamic collapse: seen on bronchoscopy 8/6 called back emergently to bedside for tracheostomy occlusion: emergent bronch, bivona trach migrated back 2cm, improved with advancing to 2cm above carina 8/8 collapse caused hypoxia. Improved with repositioning  8/9 afternoon had another episode of tracheal collapse and occlusion of trach requiring BVM, sedation. Family produced paperwork which seems to have conflicting information about pts wishes  8/11: pt continues to have multiple daily episodes of tracheal collapse/mucous plugging req lavage, BVM. Repeated discussions with family re: code status esp in light of papwerwork  Interim History / Subjective:   Remains chronic critically ill, on vent   Objective   Blood pressure (!) 79/54, pulse 71, temperature 97.8 F (36.6 C), temperature source Axillary, resp. rate 16, height 5\' 2"  (1.575 m), weight 88.7 kg, SpO2 100 %.    Vent Mode: PRVC FiO2 (%):  [40 %] 40 % Set Rate:  [16 bmp] 16 bmp Vt Set:  [400 mL] 400 mL PEEP:  [5 cmH20-10 cmH20] 5 cmH20 Plateau Pressure:  [17 cmH20-23 cmH20] 17 cmH20   Intake/Output Summary (Last 24 hours) at 12/02/2020 0818 Last data filed at 12/02/2020 12/04/2020 Gross per 24 hour  Intake 45 ml  Output 1150 ml  Net -1105 ml    Filed Weights   11/27/20 0500 11/28/20 0449 11/29/20 0500  Weight: 88.7 kg 88.7 kg 88.7 kg    Examination:  General:  Chronically ill appearing older adult F trach/vent NAD  HENT:  NCAT pink mm Bivona trach #8  PULM: No accessory muscle use, no rhonchi CV: rrr s1s2 no rgm  GI: soft ndnt + bowel sounds  GU: purewick  MSK: no acute joint deformity no cyanosis or clubbing   Neuro: Spontaneous eye opening,  does not follow commands  Resolved Hospital Problem list     Assessment & Plan:   Anoxic encephalopathy / anoxic brain injury Acute on chronic respiratory failure w hypercarbia and hypoxia  Severe tracheobronchomalacia Trach status Hx COPD  -Recurrent episodes of desats with hyperdynamic collapse of airway in spite of increasing PEEP and repositioning trach -etiology of trach occlusion, dynamic airway collapse.Associated severe dyssynchrony  Plan: Pt is vent dependent 2/2 severe tracheomalacia Bivona trach at 14 (tube is marked at correct position)--  Goals of Care:  Continuing GOC talks. Family has produced some paperwork (copy in chart) that presents some conflicting information about the pts wishes. Independent of the this conflicting information, underlying prognosis for meaningful recovery is poor with anoxic injury. Chronic state further complicated by severe respiratory condition with dynamically collapsing airway with severe tracheobronchomalacia, trach/vent   Plan is for discharge to [facility]  in [place], [place], [place] application in progress  Best Practice (right click and "Reselect all SmartList Selections" daily)   Diet/type: tubefeeds DVT prophylaxis: LMWH GI prophylaxis: H2B Lines: N/A Foley:  N/A Code Status:  full code Last date of multidisciplinary goals of care discussion [ongoing. Updated by [facility] 8/11, TRH ongoing. Pal care s/o previously. Ethics consult pending]  Labs   CBC: Recent Labs  Lab 11/29/20 0816  WBC 9.1  HGB 10.2*  HCT 32.9*  MCV 88.2  PLT 288      Basic Metabolic Panel: Recent Labs  Lab 11/29/20 0816  NA 140  K 3.8  CL 105  CO2 28  GLUCOSE 184*  BUN 28*  CREATININE 0.47  CALCIUM 10.2     GFR: Estimated Creatinine Clearance: 71.5 mL/min (by C-G formula based on SCr of 0.47 mg/dL). Recent Labs  Lab 11/29/20 0816  WBC 9.1      Liver Function Tests: No results for input(s): AST, ALT, ALKPHOS, BILITOT, PROT,  ALBUMIN in the last 168 hours. No results for input(s): LIPASE, AMYLASE in the last 168 hours. No results for input(s): AMMONIA in the last 168 hours.  ABG    Component Value Date/Time   PHART 7.414 11/20/2020 1253   PCO2ART 52.3 (H) 11/20/2020 1253   PO2ART 127 (H) 11/20/2020 1253   HCO3 33.6 (H) 11/20/2020 1253   TCO2 35 (H) 11/20/2020 1253   O2SAT 99.0 11/20/2020 1253      Coagulation Profile: No results for input(s): INR, PROTIME in the last 168 hours.   Cardiac Enzymes: No results for input(s): CKTOTAL, CKMB, CKMBINDEX, TROPONINI in the last 168 hours.  HbA1C: Hgb A1c MFr Bld  Date/Time Value Ref Range Status  10/22/2020 06:31 PM 7.0 (H) 4.8 - 5.6 % Final    Comment:    (NOTE)         Prediabetes: 5.7 - 6.4         Diabetes: >6.4         Glycemic control for adults with diabetes: <7.0   08/30/2019 04:53 AM 6.5 (H) 4.8 - 5.6 % Final    Comment:    (NOTE) Pre diabetes:          5.7%-6.4% Diabetes:              >6.4% Glycemic control for   <7.0%  adults with diabetes     CBG: Recent Labs  Lab 12/01/20 1755 12/01/20 2007 12/01/20 2342 12/02/20 0333 12/02/20 0742  GLUCAP 150* 218* 133* 77 148*    Cyril Mourning MD. FCCP. [facility] Pager : [phone]  If no response to pager , please call [phone] until 7 pm After 7:00 pm call Elink  [phone]     12/02/2020, 8:18 AM

## 2020-12-03 DIAGNOSIS — J9621 Acute and chronic respiratory failure with hypoxia: Secondary | ICD-10-CM | POA: Diagnosis not present

## 2020-12-03 LAB — GLUCOSE, CAPILLARY
Glucose-Capillary: 136 mg/dL — ABNORMAL HIGH (ref 70–99)
Glucose-Capillary: 188 mg/dL — ABNORMAL HIGH (ref 70–99)
Glucose-Capillary: 170 mg/dL — ABNORMAL HIGH (ref 70–99)
Glucose-Capillary: 192 mg/dL — ABNORMAL HIGH (ref 70–99)
Glucose-Capillary: 183 mg/dL — ABNORMAL HIGH (ref 70–99)

## 2020-12-03 NOTE — Progress Notes (Addendum)
PROGRESS NOTE    Sonia Drake  [id number]:[id number] DOB: 07-01-1955 DOA: 10/19/2020 PCP: Garwin Brothers, MD    Chief Complaint  Patient presents with   Respiratory Distress    Brief Narrative:  Sonia Drake is a 65 year old female with past medical history significant for cardiac arrest 8/2021with subsequent anoxic brain injury s/p PEG tube and trach, DM2, history of chronic diastolic congestive heart failure who presented to [facility] ED on 6/12 with worsening respiratory failure from SNF.  Patient previously was ventilator dependent but had been weaned from her trach.  Patient was admitted to the PCCM service underwent bronchoscopy on 10/05/2020 which revealed dynamic collapse of her trachea and subsequently her tracheostomy was replaced.  Remains dependent on ventilator support and poorly interactive.  Patient was seen by palliative care on 10/13/2020 with plans of no de-escalation of care per POA. . Daughter to discuss with the rest of the family and to get back to [name] regarding the code status.  Pt has been accepted in to the facility at [facility]. Facility is trying reach family for documentation needed to apply for [place] prior to discharging to the facility.   Assessment & Plan:   Principal Problem:   Sepsis ([place]) Active Problems:   COPD (chronic obstructive pulmonary disease) (HCC)   Chronic diastolic CHF (congestive heart failure) (HCC)   Acute on chronic respiratory failure with hypoxia (HCC)   GERD (gastroesophageal reflux disease)   Seizures (HCC)   Type 2 diabetes mellitus without complication, with long-term current use of insulin (HCC)   Severe hypoxic-ischemic encephalopathy   Tracheostomy dependence ([place])   Pressure injury of skin   Sepsis ruled out.  Acute on chronic hypoxic respiratory failure:  Probably secondary to anoxic brain injury with dynamic airway collapse noted on bronchoscopy on 10/05/2020 Patient continues to remain vent dependent. No changes in meds.   Repeat bronchoscopy on 11/24/2020 showed tracheal/bronchial malacia with complete dynamic collapse of the right mainstem bronchus. Repeat bronchoscopy on 11/25/2020 showed airway occlusion with findings of migrated tracheostomy which was repositioned with resolution of airway occlusion Patient currently on bronchodilators, albuterol as needed and IV fentanyl for vent dyssynchrony Pulmonary toilet PCCM continues to follow and appreciate recommendations. No events overnight.     Cardiac arrest on 11/2019 with resultant severe anoxic brain injury Patient remains nonverbal, unresponsive, bedbound and in vegetative state S/p tracheostomy and PEG tube Neurology suggest patient does not appear to have meaningful recovery. Daughter refused to have any conversations with palliative care at this time Daughter to talk to the rest of the family members and get back to [name] regarding code status.     Seizure disorder in the setting of anoxic brain injury Continue with Keppra, valproic acid, clonazepam and lorazepam as needed. No seizures overnight.    Insulin-dependent diabetes mellitus Continue with Lantus and sliding scale insulin. CBG (last 3)  Recent Labs    12/03/20 0739 12/03/20 1129 12/03/20 1514  GLUCAP 188* 170* 192*  Hemoglobin A1c in 6/22 is 7. No changes in meds.     Anemia of chronic disease Hemoglobin stable around 10. Transfuse to keep hemoglobin greater than 7.     History of CVA Continue with Plavix.   Chronic diastolic heart failure Patient appears to be compensated at this time and on vent support  Sinus tachycardia:  Pt does nt appear to be in distress.  Prn metoprolol ordered. Afebrile and rest of the vs wnl.    DVT prophylaxis: (Lovenox/) Code Status: (Full  code) Family Communication: none at bedside.  Disposition:   Status is: Inpatient  Remains inpatient appropriate because:Unsafe d/c plan  Dispo: The patient is from: SNF              Anticipated  d/c is to: SNF              Patient currently is medically stable to d/c.   Difficult to place patient Yes       Consultants:  PCCM Neurology Palliative care Infectious disease, Dr. Linus Salmons  Procedures:  EEG 6/26 - 6/28 Bronchoscopy on 11/24/2020 showed tracheal/bronchial malacia with complete dynamic collapse of the right mainstem bronchus was observed.   Bronchoscopy 8/6 for airway occlusion with findings of migrated tracheostomy which was repositioned with resolution of airway occlusion.  Antimicrobials:   Vancomycin 6/12 - 6/15, 6/18 - 6/20 Cefepime 6/12 - 6/15, 6/18 - 6/18 Metronidazole 6/12 - 6/15   Subjective: No events overnight.   Objective: Vitals:   12/03/20 1146 12/03/20 1300 12/03/20 1544 12/03/20 1558  BP:  (!) 77/53 (!) 114/57   Pulse:  86 (!) 110   Resp:  12 19   Temp: 98.3 F (36.8 C)   98.4 F (36.9 C)  TempSrc: Axillary   Axillary  SpO2:  99% 99%   Weight:      Height:        Intake/Output Summary (Last 24 hours) at 12/03/2020 1610 Last data filed at 12/03/2020 1551 Gross per 24 hour  Intake 635 ml  Output 2150 ml  Net -1515 ml   Filed Weights   11/28/20 0449 11/29/20 0500 12/03/20 0321  Weight: 88.7 kg 88.7 kg 87.7 kg    Examination:  General exam: Chronically ill appearing lady, non communicative, s/p trach and PEG.  Respiratory system: Air entry fair, no wheezing heard, on vent support.  Cardiovascular system: RRR no JVD, no pedal edema.  Gastrointestinal system: Abdomen is soft, non distended, S/P PEG placed.  Central nervous system: alert and not following commands.  Extremities: No pedal edema.  Skin: No rashes seen. Psychiatry: Cannot be assessed    Data Reviewed: I have personally reviewed following labs and imaging studies  CBC: Recent Labs  Lab 11/29/20 0816  WBC 9.1  HGB 10.2*  HCT 32.9*  MCV 88.2  PLT 354    Basic Metabolic Panel: Recent Labs  Lab 11/29/20 0816  NA 140  K 3.8  CL 105  CO2 28  GLUCOSE  184*  BUN 28*  CREATININE 0.47  CALCIUM 10.2    GFR: Estimated Creatinine Clearance: 71.1 mL/min (by C-G formula based on SCr of 0.47 mg/dL).  Liver Function Tests: No results for input(s): AST, ALT, ALKPHOS, BILITOT, PROT, ALBUMIN in the last 168 hours.  CBG: Recent Labs  Lab 12/02/20 2335 12/03/20 0351 12/03/20 0739 12/03/20 1129 12/03/20 1514  GLUCAP 160* 136* 188* 170* 192*     No results found for this or any previous visit (from the past 240 hour(s)).       Radiology Studies: No results found.      Scheduled Meds:  arformoterol  15 mcg Nebulization BID   baclofen  5 mg Per Tube TID   budesonide  0.5 mg Nebulization BID   chlorhexidine gluconate (MEDLINE KIT)  15 mL Mouth Rinse BID   Chlorhexidine Gluconate Cloth  6 each Topical Daily   clonazePAM  0.5 mg Per Tube BID   clopidogrel  75 mg Per Tube Daily   enoxaparin (LOVENOX) injection  0.5 mg/kg Subcutaneous  Q24H   famotidine  20 mg Per Tube BID   feeding supplement (PROSource TF)  45 mL Per Tube BID   fiber  1 packet Per Tube BID   Gerhardt's butt cream   Topical BID   guaiFENesin  5 mL Per Tube Q6H   hydrocortisone cream   Topical TID   insulin aspart  0-15 Units Subcutaneous Q4H   insulin glargine  40 Units Subcutaneous QHS   levETIRAcetam  2,500 mg Per Tube BID   loratadine  10 mg Per Tube Daily   mouth rinse  15 mL Mouth Rinse 10 times per day   montelukast  10 mg Per Tube Daily   nutrition supplement (JUVEN)  1 packet Per Tube BID   revefenacin  175 mcg Nebulization Daily   tiZANidine  2 mg Per Tube QHS   valproic acid  1,000 mg Per Tube Q12H   Continuous Infusions:  sodium chloride     feeding supplement (JEVITY 1.5 CAL/FIBER) 1,000 mL (12/03/20 0542)     LOS: 71 days        Hosie Poisson, MD [facility]   To contact the attending provider between 7A-7P or the covering provider during after hours 7P-7A, please log into the web site [email or web address] and access using  universal [place] password for that web site. If you do not have the password, please call the hospital operator.  12/03/2020, 4:10 PM

## 2020-12-04 DIAGNOSIS — T884XXS Failed or difficult intubation, sequela: Secondary | ICD-10-CM

## 2020-12-04 DIAGNOSIS — J398 Other specified diseases of upper respiratory tract: Secondary | ICD-10-CM | POA: Diagnosis not present

## 2020-12-04 DIAGNOSIS — J9621 Acute and chronic respiratory failure with hypoxia: Secondary | ICD-10-CM | POA: Diagnosis not present

## 2020-12-04 DIAGNOSIS — T884XXA Failed or difficult intubation, initial encounter: Secondary | ICD-10-CM | POA: Insufficient documentation

## 2020-12-04 DIAGNOSIS — Z9911 Dependence on respirator [ventilator] status: Secondary | ICD-10-CM

## 2020-12-04 DIAGNOSIS — Z930 Tracheostomy status: Secondary | ICD-10-CM | POA: Diagnosis not present

## 2020-12-04 LAB — GLUCOSE, CAPILLARY
Glucose-Capillary: 137 mg/dL — ABNORMAL HIGH (ref 70–99)
Glucose-Capillary: 143 mg/dL — ABNORMAL HIGH (ref 70–99)
Glucose-Capillary: 156 mg/dL — ABNORMAL HIGH (ref 70–99)
Glucose-Capillary: 165 mg/dL — ABNORMAL HIGH (ref 70–99)
Glucose-Capillary: 193 mg/dL — ABNORMAL HIGH (ref 70–99)
Glucose-Capillary: 198 mg/dL — ABNORMAL HIGH (ref 70–99)
Glucose-Capillary: 214 mg/dL — ABNORMAL HIGH (ref 70–99)

## 2020-12-04 NOTE — Progress Notes (Addendum)
[facility] PROGRESS NOTE  Sonia Drake LDJ:[id number] DOB: 18-Sep-1955 DOA: 09/28/2020 PCP: Garwin Brothers, MD  Status: Remains inpatient appropriate because:Altered mental status, Unsafe d/c plan, and Inpatient level of care appropriate due to severity of illness  Dispo: The patient is from: SNF              Anticipated d/c is to: SNF [facility] and [facility] but due to the fact she has an adjustable trach and given her recurrent airway issues as described above they have declined to take her this particular facility.              Patient currently is not medically stable to d/c.   Difficult to place patient Yes   Level of care: ICU  Code Status: Full-pulmonary medicine documents paperwork that presents some conflicting information about the patient's wishes Family Communication:  DVT prophylaxis: Lovenox COVID vaccination status: Per H&P patient is not vaccinated    HPI: 65 y/o female with medical history significant of asthma/COPD, CHF, cardiac arrest with PEA/CPR with chronic respiratory failure on permanent trach with supplemental oxygen at 6L, type 2 DM, GERD, seizures and severe hypoxic-ischemic encephalopathy who presented to ER via EMS for respiratory distress from SNF.  She presented with findings consistent with acute aspiration pneumonitis.  She decompensated after arrival and required transfer to ICU and initiation of mechanical ventilation.  She had sepsis physiology presumed secondary to [facility] bacteremia but it was later determined that the blood culture results were related to contaminants and patient was no longer determined to be septic   She had previously been ventilator dependent but had been weaned from the ventilator briefly during her stay. On 6/16 a bronchoscopy revealed dynamic collapse of her trachea, tracheostomy was changed from 8.0 XLT cuffless to 6.0 XLT cuffed. She now is requiring chronic ventilatory support 2/2  advanced COPD/asthma and tracheobronchomalacia. PCCM physicians updated patient's daughter on status and poor prognosis along with recommendation for chronic vent.  Palliative medicine has had multiple discussions with the daughter.  Daughter continues to request aggressive care.  Unfortunately she continues to have multiple daily events regarding tracheal collapse with mucous plugging requiring excessive lavage and suction as well as bag-valve-mask for ventilation  Significant Hospital events: 6/12 admitted from skilled nursing facility after aspiration event and copious secretions per tracheostomy 6/13 appears comfortable currently on 35% ATC. No distress. WBC ct trending down. No events overnight growing GPC clusters in two of two cultures  6/16 increased work of breathing, upper airway noise, abdominal muscle use.  Bronchoscopy performed as trach exchange cuffless #8 Shiley XLT showed dynamic airway collapse, tracheal collapse. 6/16 continued respiratory distress moved to ICU, changed to cuffed #6 Shiley XLT, placed on MV with some improved comfort 6/12 cefepime > 6/14, 6/18 6/12 vanc > 6/14, 6/18 6/23 mucus plugging, severe vent dyssynchrony 6/24-25 intermittent jerking, seizures on EEG 6/27 weaned off propofol gtt 6/28 briefly tolerated trach collar in AM 6/29 tolerated trach collar in AM; no further seizure like activity; working with CM/SW for vent-SNF 7/1 Weaning on CPAP/ PS 10/5, CM SW working on [employer], NO seizure activity, secretions are not an issue per nursing 7/11 trach changed to #8 Bivona with air cuff, much improved. 7/12 TCT 12 hours; rested on Vent overnight 7/13 on TCT 7/23 Trach collar tolerated 7/24 Full vent support 7/26>> Remains on full vent support this am, no issues with secretions per nursing 8/5 occluding trach with dynamic collapse: seen on  bronchoscopy 8/6 called back emergently to bedside for tracheostomy occlusion: emergent bronch, bivona trach migrated back  2cm, improved with advancing to 2cm above carina 8/8 collapse caused hypoxia. Improved with repositioning  8/9 afternoon had another episode of tracheal collapse and occlusion of trach requiring BVM, sedation. Family produced paperwork which seems to have conflicting information about pts wishes  8/11: pt continues to have multiple daily episodes of tracheal collapse/mucous plugging req lavage, BVM. Repeated discussions with family re: code status esp in light of paperwork  8/15 consultation placed with ethics committee to evaluate regarding futility of care  Subjective: Unchanged status.  She remains nonverbal and noninteractive secondary to brain injury.  No family at bedside.   Objective: Vitals:   12/04/20 1000 12/04/20 1100  BP: (!) 57/28 119/65  Pulse: 76 91  Resp: 12 12  Temp:    SpO2: 100% 100%    Intake/Output Summary (Last 24 hours) at 12/04/2020 1216 Last data filed at 12/04/2020 1100 Gross per 24 hour  Intake 850 ml  Output 1450 ml  Net -600 ml   Filed Weights   11/29/20 0500 12/03/20 0321 12/04/20 0442  Weight: 88.7 kg 87.7 kg 87.5 kg    Exam:  Constitutional: NAD, nonverbal  Respiratory: 8.0 cuffed Bivana air trach, ventilator settings: PRVC mode with a VT of 400, rate 16, PEEP 5, FiO2 40%-lungs are clear to auscultation bilaterally and no increased work of breathing noted Cardiovascular: Regular rate and maintaining sinus rhythm, no murmurs / rubs / gallops. No extremity edema. 2+ pedal pulses.  Abdomen: no tenderness, no masses palpated. Bowel sounds positive.  PEG tube for feedings. LBM 8/13 Skin: Stage II decubitus of sacrum-not examined today Neurologic: Unable to accurately test cranial nerves, exam nonfocal.  Extremities are stiff with PROM.  Not awake and is nonverbal. Psychiatric: Eyes are open but patient does not follow persons in the room and essentially does not have blink reflex.   Assessment/Plan: Acute problems: Acute on chronic respiratory  failure w/ hypoxemia Tracheostomy/vent dependence 2/2 Tracheobronchomalcia and advanced COPD/asthma Not a candidate to wean from ventilator or decannulation Continue chest PT and bronchodilators, tracheal suction as needed PCCM physician spoke at length with daughter about chronicity of these medical problems and that patient will always be ventilator dependent.  Daughter opted to continue current aggressive care Now with an adjustable trach that is limiting discharge options As of 8/11 patient continues to have daily episodes of tracheal collapse/mucous plugging req lavage, BVM.   Known anoxic brain injury after cardiac arrest  Patient without any meaningful improvement.  Her eyes are open without blink reflex and does not track around the room.  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 Continue low-dose baclofen and Zanaflex Prognosis extremely poor and at this juncture it appears we are approaching futility of care.  Palliative medicine has reached out to the family on multiple occasions but they continue to wish for aggressive care.  Obtain formal ethics team consultation regarding futility of care  Seizure disorder In review of outpatient notes this was present prior to her cardiac arrest Continue Keppra-Had episodic issues related to status epilepticus requiring formal neurological consultation  History of cardiac arrest Review of outpatient cardiology documents from 09/08/2019 revealed  no evidence of CAD Patient was treated for cardiac arrest in August 2021 at [facility].  According to EMS records during that admission patient called 911 in  context of respiratory failure.  She was gasping for air and then became unresponsive.  Upon EMS arrival patient was pulseless and apneic.  History of CVA Continue Plavix   Hyponatremia Resolved after holding free water current sodium 139  Diabetes mellitus 2 on long-term insulin with hyperglycemia Continue Lantus and SSI Hemoglobin A1c 7.0 Current CBGs well-controlled   Hypertension Blood pressure times has been suboptimal therefore beta-blocker discontinued. As of 8/16 patient noted to be in a 4 L negative balance so was given 1 L of IV fluid Resumed on free water which had been held secondary to hyponatremia-current sodium is 142-follow closely and if sodium begins to drop can use alternative fluids for volume replacement   Chronic diastolic dysfunction Echocardiogram this admission with grade 1 diastolic dysfunction, moderate LV hypertrophy hyperdynamic LV systolic function Currently euvolemic Monitor I/O Continue as needed beta-blocker for elevated heart rate SBP remains suboptimal Not on ACE or ARB secondary to suboptimal blood pressure reading   Chronic protein malnutrition secondary to sequelae from anoxic brain injury/PEG tube dependence Continue current tube feedings with Prosource Nutrition Problem: Increased nutrient needs Etiology: wound healing Signs/Symptoms: estimated needs Interventions: Tube feeding Body mass index is 35.28 kg/m.   Anemia of chronic disease Hemoglobin remained stable around 10  Stage II sacral decubitus Wound / Incision (Open or Dehisced) 10/31/20 (MASD) Moisture Associated Skin Damage Buttocks Bilateral (Active)  Date First Assessed/Time First Assessed: 10/31/20 2100   Wound Type: (MASD) Moisture Associated Skin Damage  Location: Buttocks  Location Orientation: Bilateral  Present on Admission: No    Assessments 10/31/2020  9:20 PM 12/04/2020  4:35 AM  Dressing Type None;Other (Comment) Foam - Lift dressing to assess site every shift   Dressing Changed -- Changed  Dressing Status -- Clean;Dry;Intact  Dressing Change Frequency -- Twice a day  Site / Wound Assessment Pink Clean;Dry  Peri-wound Assessment -- Intact;Erythema (blanchable)  Margins -- Unattached edges (unapproximated)  Drainage Amount -- None  Treatment Cleansed Cleansed     No Linked orders to display       Data Reviewed: Basic Metabolic Panel: Recent Labs  Lab 11/29/20 0816  NA 140  K 3.8  CL 105  CO2 28  GLUCOSE 184*  BUN 28*  CREATININE 0.47  CALCIUM 10.2   Liver Function Tests: No results for input(s): AST, ALT, ALKPHOS, BILITOT, PROT, ALBUMIN in the last 168 hours.  No results for input(s): LIPASE, AMYLASE in the last 168 hours. No results for input(s): AMMONIA in the last 168 hours. CBC: Recent Labs  Lab 11/29/20 0816  WBC 9.1  HGB 10.2*  HCT 32.9*  MCV 88.2  PLT 288   Cardiac Enzymes: No results for input(s): CKTOTAL, CKMB, CKMBINDEX, TROPONINI in the last 168 hours. BNP (last 3 results) Recent Labs    10/04/20 0051 10/05/20 0049 10/06/20 0548  BNP 145.8* 115.3* 125.4*    ProBNP (last 3 results) No results for input(s): PROBNP in the last 8760 hours.  CBG: Recent Labs  Lab 12/03/20 1514 12/03/20 2031 12/04/20 0002 12/04/20 0405 12/04/20 0743  GLUCAP 192* 183* 193* 143* 214*    No results found for this or any previous visit (from the past 240 hour(s)).    Studies: No results found.  Scheduled Meds:  arformoterol  15 mcg Nebulization BID   baclofen  5  mg Per Tube TID   budesonide  0.5 mg Nebulization BID   chlorhexidine gluconate (MEDLINE KIT)  15 mL Mouth Rinse BID   Chlorhexidine Gluconate Cloth  6 each Topical Daily   clonazePAM  0.5 mg Per Tube BID   clopidogrel  75 mg Per Tube Daily   enoxaparin (LOVENOX) injection  0.5 mg/kg Subcutaneous Q24H   famotidine  20 mg Per Tube BID   feeding supplement (PROSource TF)  45 mL Per Tube BID   fiber  1 packet Per Tube BID   Gerhardt's butt cream    Topical BID   guaiFENesin  5 mL Per Tube Q6H   hydrocortisone cream   Topical TID   insulin aspart  0-15 Units Subcutaneous Q4H   insulin glargine  40 Units Subcutaneous QHS   levETIRAcetam  2,500 mg Per Tube BID   loratadine  10 mg Per Tube Daily   mouth rinse  15 mL Mouth Rinse 10 times per day   montelukast  10 mg Per Tube Daily   nutrition supplement (JUVEN)  1 packet Per Tube BID   revefenacin  175 mcg Nebulization Daily   tiZANidine  2 mg Per Tube QHS   valproic acid  1,000 mg Per Tube Q12H   Continuous Infusions:  sodium chloride     feeding supplement (JEVITY 1.5 CAL/FIBER) 1,000 mL (12/04/20 0443)    Principal Problem:   Sepsis ([place]) Active Problems:   COPD (chronic obstructive pulmonary disease) (HCC)   Chronic diastolic CHF (congestive heart failure) ([place])   Acute on chronic respiratory failure with hypoxia (HCC)   GERD (gastroesophageal reflux disease)   Seizures ([place])   Type 2 diabetes mellitus without complication, with long-term current use of insulin ([place])   Severe hypoxic-ischemic encephalopathy   Tracheostomy dependence ([place])   Pressure injury of skin   Consultants: PCCM Palliative medicine Neurology  Procedures: Echocardiogram Replacement of trach  Antibiotics: Cefepime 6/12 through 6/14 Flagyl 6/12 through 6/14 Vancomycin 6/12 through 6/14 Cefepime x1 dose 6/18 Vancomycin x2 doses 6/18, 6/19   Time spent: 45 minutes    Erin Hearing ANP  [facility] 7 am - 330 pm/M-F for direct patient care and secure chat Please refer to Amion for contact info 64  days

## 2020-12-04 NOTE — Progress Notes (Addendum)
12pm: CSW spoke with Tammy who states that due to the patient having an adjustable Bivona trach the facility cannot accommodate her needs. Tammy states that if the trach is changed to a non-adjustable trach the facility could accept her.  CSW spoke with Boyd Kerbs, RN regarding patient.   CSW spoke with Revonda Standard, NP to request an Ethics consult be placed.  9:15am: CSW attempted to reach Tammy at [facility] in [place] without success - a voicemail was left requesting a return call.  [name], MSW, LCSW Transitions of Care  Clinical Social Worker II [phone]

## 2020-12-04 NOTE — Progress Notes (Signed)
NAME:  Sonia Drake, MRN:  [id number], DOB:  [birth year], LOS: 64 ADMISSION DATE:  Oct 31, 2020, CONSULTATION DATE:  6/15 REFERRING MD:  Sonia Drake, CHIEF COMPLAINT:  Dyspnea   History of Present Illness:  65 y/o female with anoxic brain injury, tracheostomy status who was admitted from a SNF on 6/12 with worsening respiratory failure.  She had previously been ventilator dependent but had been weaned from the trach.  On 6/16 a bronchoscopy revealed dynamic collapse of her trachea, tracheostomy was replaced.  Later required mechanical ventilation.  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 No distress. WBC ct trending down. No events overnight growing GPC clusters in two of two cultures  6/16 increased work of breathing, upper airway noise, abdominal muscle use.  Bronchoscopy performed as trach exchange cuffless #8 Shiley XLT showed dynamic airway collapse, tracheal collapse. 6/16 continued respiratory distress moved to ICU, changed to cuffed #6 Shiley XLT, placed on MV with some improved comfort 6/12 cefepime > 6/14, 6/18 6/12 vanc > 6/14, 6/18 6/23 mucus plugging, severe vent dyssynchrony 6/24-25 intermittent jerking, seizures on EEG 6/27 weaned off propofol gtt 6/28 briefly tolerated trach collar in AM 6/29 tolerated trach collar in AM; no further seizure like activity; working with CM/SW for vent-SNF 7/1 Weaning on CPAP/ PS 10/5, CM SW working on [employer], NO seizure activity, secretions are not an issue per  nursing 7/11 trach changed to #8 Bivona with air cuff, much improved. 7/12 TCT 12 hours; rested on Vent overnight 7/13 on TCT 7/23 Trach collar tolerated 7/24 Full vent support 7/26>> Remains on full vent support this am, no issues with secretions per nursing 8/5 occluding trach with dynamic collapse: seen on bronchoscopy 8/6 called back emergently to bedside for tracheostomy occlusion: emergent bronch, bivona trach migrated back 2cm, improved with advancing to 2cm above carina 8/8 collapse caused hypoxia. Improved with repositioning  8/9 afternoon had another episode of tracheal collapse and occlusion of trach requiring BVM, sedation. Family produced paperwork which seems to have conflicting information about pts wishes  8/11: pt continues to have multiple daily episodes of tracheal collapse/mucous plugging req lavage, BVM. Repeated discussions with family re: code status esp in light of papwerwork  Interim History / Subjective:   No sig change. Remains on vent. Working on placement. No further episodes mucous plugging noted   Objective   Blood pressure 97/64, pulse 83, temperature (!) 97.5 F (36.4 C), temperature source Oral, resp. rate 14, height 5\' 2"  (1.575 m), weight 87.5 kg, SpO2 100 %.    Vent Mode: PRVC FiO2 (%):  [40 %] 40 % Set Rate:  [16 bmp] 16 bmp Vt Set:  [400 mL] 400 mL PEEP:  [5 cmH20] 5 cmH20 Plateau Pressure:  [15 cmH20-20 cmH20] 16 cmH20   Intake/Output Summary (Last 24 hours) at 12/04/2020 0937 Last data filed at 12/04/2020 0800 Gross per 24 hour  Intake 850 ml  Output 1525 ml  Net -675 ml   Filed Weights   11/29/20 0500 12/03/20 0321 12/04/20 0442  Weight: 88.7 kg 87.7 kg 87.5 kg    Examination:  General:  Chronically ill appearing  female NAD on vent  HENT: NCAT pink mm Bivona trach #8  PULM: resps even non labored on vent, diminished  CV: rrr s1s2 no rgm  GI: soft ndnt + bowel sounds  MSK: no acute joint deformity no cyanosis or clubbing, foot  drop, no sig edema  Neuro: Spontaneous eye opening, does not follow commands  Resolved Hospital Problem list     Assessment & Plan:   Anoxic encephalopathy / anoxic brain injury Acute on chronic respiratory failure w hypercarbia and hypoxia  Severe tracheobronchomalacia Trach status Hx COPD  -Recurrent episodes of desats with hyperdynamic collapse of airway in spite of increasing PEEP and repositioning trach -etiology of trach occlusion, dynamic airway collapse. Associated severe dyssynchrony  Plan: Vent/trach dependent - no weaning  Working on vent SNF placement  Continue bivona trach   Goals of Care:  Continuing GOC talks. Family has produced some paperwork (copy in chart) that presents some conflicting information about the pts wishes. Independent of the this conflicting information, underlying prognosis for meaningful recovery is poor with anoxic injury. Chronic state further complicated by severe respiratory condition with dynamically collapsing airway with severe tracheobronchomalacia, trach/vent   Plan is for discharge to [facility] vent SNF in [place], [place], [place] application in progress  Best Practice (right click and "Reselect all SmartList Selections" daily)   Diet/type: tubefeeds DVT prophylaxis: LMWH GI prophylaxis: H2B Lines: N/A Foley:  N/A Code Status:  full code Last date of multidisciplinary goals of care discussion - ongoing. Updated by PCCM 8/11  Labs   CBC: Recent Labs  Lab 11/29/20 0816  WBC 9.1  HGB 10.2*  HCT 32.9*  MCV 88.2  PLT 288     Basic Metabolic Panel: Recent Labs  Lab 11/29/20 0816  NA 140  K 3.8  CL 105  CO2 28  GLUCOSE 184*  BUN 28*  CREATININE 0.47  CALCIUM 10.2    GFR: Estimated Creatinine Clearance: 71.1 mL/min (by C-G formula based on SCr of 0.47 mg/dL). Recent Labs  Lab 11/29/20 0816  WBC 9.1     Liver Function Tests: No results for input(s): AST, ALT, ALKPHOS, BILITOT, PROT, ALBUMIN in the last 168  hours. No results for input(s): LIPASE, AMYLASE in the last 168 hours. No results for input(s): AMMONIA in the last 168 hours.  ABG    Component Value Date/Time   PHART 7.414 11/20/2020 1253   PCO2ART 52.3 (H) 11/20/2020 1253   PO2ART 127 (H) 11/20/2020 1253   HCO3 33.6 (H) 11/20/2020 1253   TCO2 35 (H) 11/20/2020 1253   O2SAT 99.0 11/20/2020 1253     Coagulation Profile: No results for input(s): INR, PROTIME in the last 168 hours.   Cardiac Enzymes: No results for input(s): CKTOTAL, CKMB, CKMBINDEX, TROPONINI in the last 168 hours.  HbA1C: Hgb A1c MFr Bld  Date/Time Value Ref Range Status  10-30-2020 06:31 PM 7.0 (H) 4.8 - 5.6 % Final    Comment:    (NOTE)         Prediabetes: 5.7 - 6.4         Diabetes: >6.4         Glycemic control for adults with diabetes: <7.0   08/30/2019 04:53 AM 6.5 (H) 4.8 - 5.6 % Final    Comment:    (NOTE) Pre diabetes:          5.7%-6.4% Diabetes:              >6.4% Glycemic control for   <7.0% adults with  diabetes     CBG: Recent Labs  Lab 12/03/20 1514 12/03/20 2031 12/04/20 0002 12/04/20 0405 12/04/20 0743  GLUCAP 192* 183* 193* 143* 214*    Dirk Dress, NP Pulmonary/Critical Care Medicine  12/04/2020  9:37 AM

## 2020-12-05 DIAGNOSIS — R569 Unspecified convulsions: Secondary | ICD-10-CM | POA: Diagnosis not present

## 2020-12-05 DIAGNOSIS — T884XXS Failed or difficult intubation, sequela: Secondary | ICD-10-CM | POA: Diagnosis not present

## 2020-12-05 DIAGNOSIS — J9621 Acute and chronic respiratory failure with hypoxia: Secondary | ICD-10-CM | POA: Diagnosis not present

## 2020-12-05 DIAGNOSIS — G931 Anoxic brain damage, not elsewhere classified: Secondary | ICD-10-CM | POA: Diagnosis present

## 2020-12-05 DIAGNOSIS — I5032 Chronic diastolic (congestive) heart failure: Secondary | ICD-10-CM | POA: Diagnosis not present

## 2020-12-05 LAB — COMPREHENSIVE METABOLIC PANEL
ALT: 29 U/L (ref 0–44)
AST: 15 U/L (ref 15–41)
Albumin: 2.6 g/dL — ABNORMAL LOW (ref 3.5–5.0)
Alkaline Phosphatase: 84 U/L (ref 38–126)
Anion gap: 7 (ref 5–15)
BUN: 29 mg/dL — ABNORMAL HIGH (ref 8–23)
CO2: 29 mmol/L (ref 22–32)
Calcium: 10.5 mg/dL — ABNORMAL HIGH (ref 8.9–10.3)
Chloride: 106 mmol/L (ref 98–111)
Creatinine, Ser: 0.5 mg/dL (ref 0.44–1.00)
GFR, Estimated: >60 mL/min (ref >60)
Glucose, Bld: 174 mg/dL — ABNORMAL HIGH (ref 70–99)
Potassium: 3.9 mmol/L (ref 3.5–5.1)
Sodium: 142 mmol/L (ref 135–145)
Total Bilirubin: 0.6 mg/dL (ref 0.3–1.2)
Total Protein: 6.4 g/dL — ABNORMAL LOW (ref 6.5–8.1)

## 2020-12-05 LAB — GLUCOSE, CAPILLARY
Glucose-Capillary: 172 mg/dL — ABNORMAL HIGH (ref 70–99)
Glucose-Capillary: 178 mg/dL — ABNORMAL HIGH (ref 70–99)
Glucose-Capillary: 163 mg/dL — ABNORMAL HIGH (ref 70–99)
Glucose-Capillary: 126 mg/dL — ABNORMAL HIGH (ref 70–99)
Glucose-Capillary: 146 mg/dL — ABNORMAL HIGH (ref 70–99)
Glucose-Capillary: 149 mg/dL — ABNORMAL HIGH (ref 70–99)
Glucose-Capillary: 139 mg/dL — ABNORMAL HIGH (ref 70–99)

## 2020-12-05 LAB — CBC WITH DIFFERENTIAL/PLATELET
Abs Immature Granulocytes: 0.05 10*3/uL (ref 0.00–0.07)
Basophils Absolute: 0 10*3/uL (ref 0.0–0.1)
Basophils Relative: 1 %
Eosinophils Absolute: 0.4 10*3/uL (ref 0.0–0.5)
Eosinophils Relative: 5 %
HCT: 35.4 % — ABNORMAL LOW (ref 36.0–46.0)
Hemoglobin: 10.7 g/dL — ABNORMAL LOW (ref 12.0–15.0)
Immature Granulocytes: 1 %
Lymphocytes Relative: 30 %
Lymphs Abs: 2.7 10*3/uL (ref 0.7–4.0)
MCH: 26.8 pg (ref 26.0–34.0)
MCHC: 30.2 g/dL (ref 30.0–36.0)
MCV: 88.5 fL (ref 80.0–100.0)
Monocytes Absolute: 0.9 10*3/uL (ref 0.1–1.0)
Monocytes Relative: 10 %
Neutro Abs: 4.8 10*3/uL (ref 1.7–7.7)
Neutrophils Relative %: 53 %
Platelets: 296 10*3/uL (ref 150–400)
RBC: 4 MIL/uL (ref 3.87–5.11)
RDW: 17.5 % — ABNORMAL HIGH (ref 11.5–15.5)
WBC: 8.9 10*3/uL (ref 4.0–10.5)
nRBC: 0 % (ref 0.0–0.2)

## 2020-12-05 MED ORDER — FREE WATER
200.0000 mL | Freq: Four times a day (QID) | Status: DC
  Administered 2020-12-05 – 2020-12-13 (×32): 200 mL

## 2020-12-05 MED ORDER — SODIUM CHLORIDE 0.9 % IV BOLUS
1000.0000 mL | Freq: Once | INTRAVENOUS | Status: AC
  Administered 2020-12-05: 1000 mL via INTRAVENOUS

## 2020-12-05 NOTE — Plan of Care (Signed)
  Problem: Education: Goal: Knowledge of General Education information will improve Description: Including pain rating scale, medication(s)/side effects and non-pharmacologic comfort measures Outcome: Not Progressing   Problem: Health Behavior/Discharge Planning: Goal: Ability to manage health-related needs will improve Outcome: Not Progressing   

## 2020-12-05 NOTE — Progress Notes (Signed)
Nutrition Follow-up  DOCUMENTATION CODES:   Not applicable  INTERVENTION:   Continue tube feeds via PEG: - Jevity 1.5 @ 45 ml/hr (1080 ml/day) - ProSource TF 45 ml BID - Free water flushes of 200 ml q 6 hours   Tube feeding regimen provides 1700 kcal, 91 grams of protein, and 821 ml of H2O.  Total free water with flushes: 1621 ml   - Continue Juven BID via PEG, each packet provides 80 calories, 8 grams of carbohydrate, 2.5  grams of protein, 7 grams of L-arginine and 7 grams of L-glutamine; supplement contains CaHMB, vitamins C, E, B12 and zinc to promote wound healing  NUTRITION DIAGNOSIS:   Increased nutrient needs related to wound healing as evidenced by estimated needs.  Ongoing, being addressed via TF  GOAL:   Patient will meet greater than or equal to 90% of their needs  Met via TF  MONITOR:   Vent status, Labs, Weight trends, TF tolerance, Skin, I & O's  REASON FOR ASSESSMENT:   Consult Enteral/tube feeding initiation and management  ASSESSMENT:   Sonia Drake is a 65 y.o. female with medical history significant of asthma/COPD, CHF, cardiac arrest with PEA/CPR with chronic respiratory failure on permanent trach with supplemental oxygen at 6L, type 2 DM, GERD, seizures and severe hypoxic-ischemic encephalopathy who presented to ER via EMS for respiratory distress from SNF.  Discussed pt with RN and during ICU rounds. Pt awaiting vent SNF placement. Ethics Committee has been consulted to evaluate regarding futility of care.  Pt tolerating current TF without issue. Will continue with current TF regimen.  Admit weight: 88.4 kg Current weight: 87.5 kg  Pt with non-pitting edema to BUE and BLE.  Patient remains on ventilator support via trach MV: 7.9 L/min Temp (24hrs), Avg:97.8 F (36.6 C), Min:97.6 F (36.4 C), Max:98 F (36.7 C)  Medications reviewed and include: pepcid, nutrisource fiber BID, SSI q 4 hours, lantus 40 units daily, Juven BID  Labs  reviewed. CBG's: 137-198 x 24 hours  UOP: 1150 ml x 24 hours  Diet Order:   Diet Order             Diet NPO time specified  Diet effective now                   EDUCATION NEEDS:   No education needs have been identified at this time  Skin:  Skin Assessment: Skin Integrity Issues: Other: MASD to buttocks  Last BM:  12/03/20 small type 6  Height:   Ht Readings from Last 1 Encounters:  10/12/20 _0  (1.575 m)    Weight:   Wt Readings from Last 1 Encounters:  12/04/20 87.5 kg    Ideal Body Weight:  45.5 kg  BMI:  Body mass index is 35.28 kg/m.  Estimated Nutritional Needs:   Kcal:  1600-1800  Protein:  90-105 grams  Fluid:  > 1.6 L    Gustavus Bryant, MS, RD, LDN Inpatient Clinical Dietitian Please see AMiON for contact information.

## 2020-12-05 NOTE — Progress Notes (Signed)
[facility] PROGRESS NOTE  VITORIA CONYER ZMO:[id number] DOB: Dec 10, 1955 DOA: 10/02/2020 PCP: Garwin Brothers, MD  Status: Remains inpatient appropriate because:Altered mental status, Unsafe d/c plan, and Inpatient level of care appropriate due to severity of illness  Dispo: The patient is from: SNF              Anticipated d/c is to: SN[facility] [place] but due to the fact she has an adjustable trach and given her recurrent airway issues as described above they have declined to take her this particular facility.              Patient currently is not medically stable to d/c.   Difficult to place patient Yes   Level of care: ICU  Code Status: Full-pulmonary medicine documents paperwork that presents some conflicting information about the patient's wishes Family Communication:  DVT prophylaxis: Lovenox COVID vaccination status: Per H&P patient is not vaccinated    HPI: 65 y/o female with medical history significant of asthma/COPD, CHF, cardiac arrest with PEA/CPR with chronic respiratory failure on permanent trach with supplemental oxygen at 6L, type 2 DM, GERD, seizures and severe hypoxic-ischemic encephalopathy who presented to ER via EMS for respiratory distress from SNF.  She presented with findings consistent with acute aspiration pneumonitis.  She decompensated after arrival and required transfer to ICU and initiation of mechanical ventilation.  She had sepsis physiology presumed secondary to [facility] bacteremia but it was later determined that the blood culture results were related to contaminants and patient was no longer determined to be septic   She had previously been ventilator dependent but had been weaned from the ventilator briefly during her stay. On 6/16 a bronchoscopy revealed dynamic collapse of her trachea, tracheostomy was changed from 8.0 XLT cuffless to 6.0 XLT cuffed. She now is requiring chronic ventilatory support 2/2  advanced COPD/asthma and tracheobronchomalacia. PCCM physicians updated patient's daughter on status and poor prognosis along with recommendation for chronic vent.  Palliative medicine has had multiple discussions with the daughter.  Daughter continues to request aggressive care.  Unfortunately she continues to have multiple daily events regarding tracheal collapse with mucous plugging requiring excessive lavage and suction as well as bag-valve-mask for ventilation  Significant Hospital events: 6/12 admitted from skilled nursing facility after aspiration event and copious secretions per tracheostomy 6/13 appears comfortable currently on 35% ATC. No distress. WBC ct trending down. No events overnight growing GPC clusters in two of two cultures  6/16 increased work of breathing, upper airway noise, abdominal muscle use.  Bronchoscopy performed as trach exchange cuffless #8 Shiley XLT showed dynamic airway collapse, tracheal collapse. 6/16 continued respiratory distress moved to ICU, changed to cuffed #6 Shiley XLT, placed on MV with some improved comfort 6/12 cefepime > 6/14, 6/18 6/12 vanc > 6/14, 6/18 6/23 mucus plugging, severe vent dyssynchrony 6/24-25 intermittent jerking, seizures on EEG 6/27 weaned off propofol gtt 6/28 briefly tolerated trach collar in AM 6/29 tolerated trach collar in AM; no further seizure like activity; working with CM/SW for vent-SNF 7/1 Weaning on CPAP/ PS 10/5, CM SW working on [employer], NO seizure activity, secretions are not an issue per nursing 7/11 trach changed to #8 Bivona with air cuff, much improved. 7/12 TCT 12 hours; rested on Vent overnight 7/13 on TCT 7/23 Trach collar tolerated 7/24 Full vent support 7/26>> Remains on full vent support this am, no issues with secretions per nursing 8/5 occluding trach with dynamic collapse: seen on  bronchoscopy 8/6 called back emergently to bedside for tracheostomy occlusion: emergent bronch, bivona trach migrated back  2cm, improved with advancing to 2cm above carina 8/8 collapse caused hypoxia. Improved with repositioning  8/9 afternoon had another episode of tracheal collapse and occlusion of trach requiring BVM, sedation. Family produced paperwork which seems to have conflicting information about pts wishes  8/11: pt continues to have multiple daily episodes of tracheal collapse/mucous plugging req lavage, BVM. Repeated discussions with family re: code status esp in light of paperwork  8/15 consultation placed with ethics committee to evaluate regarding futility of care  Subjective: Unchanged status.  She remains nonverbal and noninteractive secondary to brain injury.  No family at bedside.   Objective: Vitals:   12/05/20 1150 12/05/20 1200  BP: (!) 144/86 116/74  Pulse: 92 87  Resp:  18  Temp:    SpO2: 100% 100%    Intake/Output Summary (Last 24 hours) at 12/05/2020 1318 Last data filed at 12/05/2020 1200 Gross per 24 hour  Intake 1170 ml  Output 1025 ml  Net 145 ml   Filed Weights   11/29/20 0500 12/03/20 0321 12/04/20 0442  Weight: 88.7 kg 87.7 kg 87.5 kg    Exam:  Constitutional: NAD, nonverbal  Respiratory: 8.0 cuffed Bivana air trach, ventilator settings: PRVC mode with a VT of 400, rate 16, PEEP 5, FiO2 40%-lungs are clear to auscultation bilaterally and no increased work of breathing noted Cardiovascular: Regular rate and maintaining sinus rhythm, no murmurs / rubs / gallops. No extremity edema. 2+ pedal pulses.  Abdomen: no tenderness, no masses palpated. Bowel sounds positive.  PEG tube for feedings. LBM 8/13 Skin: Stage II decubitus of sacrum-not examined today Neurologic: Unable to accurately test cranial nerves, exam nonfocal.  Extremities are stiff with PROM.  Not awake and is nonverbal. Psychiatric: Eyes are open but patient does not follow persons in the room and essentially does not have blink reflex.   Assessment/Plan: Acute problems: Acute on chronic respiratory  failure w/ hypoxemia Tracheostomy/vent dependence 2/2 Tracheobronchomalcia and advanced COPD/asthma Not a candidate to wean from ventilator or decannulation Continue chest PT and bronchodilators, tracheal suction as needed PCCM physician spoke at length with daughter about chronicity of these medical problems and that patient will always be ventilator dependent.  Daughter opted to continue current aggressive care Now with an adjustable trach that is limiting discharge options As of 8/11 patient continues to have daily episodes of tracheal collapse/mucous plugging req lavage, BVM.   Known anoxic brain injury after cardiac arrest  Patient without any meaningful improvement.  Her eyes are open without blink reflex and does not track around the room.  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 Continue low-dose baclofen and Zanaflex Prognosis extremely poor and at this juncture it appears we are approaching futility of care.  Palliative medicine has reached out to the family on multiple occasions but they continue to wish for aggressive care.  Obtain formal ethics team consultation regarding futility of care  Seizure disorder In review of outpatient notes this was present prior to her cardiac arrest Continue Keppra-Had episodic issues related to status epilepticus requiring formal neurological consultation  History of cardiac arrest Review of outpatient cardiology documents from 09/08/2019 revealed  no evidence of CAD Patient was treated for cardiac arrest in August 2021 at [facility].  According to EMS records during that admission patient called 911 in  context of respiratory failure.  She was gasping for air and then became unresponsive.  Upon EMS arrival patient was pulseless and apneic.  History of CVA Continue Plavix   Hyponatremia Resolved after holding free water current sodium 139  Diabetes mellitus 2 on long-term insulin with hyperglycemia Continue Lantus and SSI Hemoglobin A1c 7.0 Current CBGs well-controlled   Hypertension Blood pressure times has been suboptimal therefore beta-blocker discontinued. As of 8/16 patient noted to be in a 4 L negative balance so was given 1 L of IV fluid Resumed on free water which had been held secondary to hyponatremia-current sodium is 142-follow closely and if sodium begins to drop can use alternative fluids for volume replacement   Chronic diastolic dysfunction Echocardiogram this admission with grade 1 diastolic dysfunction, moderate LV hypertrophy hyperdynamic LV systolic function Currently euvolemic Monitor I/O Continue as needed beta-blocker for elevated heart rate SBP remains suboptimal Not on ACE or ARB secondary to suboptimal blood pressure reading   Chronic protein malnutrition secondary to sequelae from anoxic brain injury/PEG tube dependence Continue current tube feedings with Prosource Nutrition Problem: Increased nutrient needs Etiology: wound healing Signs/Symptoms: estimated needs Interventions: Tube feeding Body mass index is 35.28 kg/m.   Anemia of chronic disease Hemoglobin remained stable around 10  Stage II sacral decubitus Wound / Incision (Open or Dehisced) 10/31/20 (MASD) Moisture Associated Skin Damage Buttocks Bilateral (Active)  Date First Assessed/Time First Assessed: 10/31/20 2100   Wound Type: (MASD) Moisture Associated Skin Damage  Location: Buttocks  Location Orientation: Bilateral  Present on Admission: No    Assessments 10/31/2020  9:20 PM 12/05/2020  8:00 AM  Dressing Type None;Other (Comment) --  Dressing Changed -- Changed  Site / Wound  Assessment Pink --  Treatment Cleansed --     No Linked orders to display       Data Reviewed: Basic Metabolic Panel: Recent Labs  Lab 11/29/20 0816 12/05/20 0844  NA 140 142  K 3.8 3.9  CL 105 106  CO2 28 29  GLUCOSE 184* 174*  BUN 28* 29*  CREATININE 0.47 0.50  CALCIUM 10.2 10.5*   Liver Function Tests: Recent Labs  Lab 12/05/20 0844  AST 15  ALT 29  ALKPHOS 84  BILITOT 0.6  PROT 6.4*  ALBUMIN 2.6*    No results for input(s): LIPASE, AMYLASE in the last 168 hours. No results for input(s): AMMONIA in the last 168 hours. CBC: Recent Labs  Lab 11/29/20 0816 12/05/20 0844  WBC 9.1 8.9  NEUTROABS  --  4.8  HGB 10.2* 10.7*  HCT 32.9* 35.4*  MCV 88.2 88.5  PLT 288 296   Cardiac Enzymes: No results for input(s): CKTOTAL, CKMB, CKMBINDEX, TROPONINI in the last 168 hours. BNP (last 3 results) Recent Labs    10/04/20 0051 10/05/20 0049 10/06/20 0548  BNP 145.8* 115.3* 125.4*    ProBNP (last 3 results) No results for input(s): PROBNP in the last 8760 hours.  CBG: Recent Labs  Lab 12/04/20 2323 12/05/20 0150 12/05/20 0323 12/05/20 0724 12/05/20 1126  GLUCAP 137* 172* 178* 163* 126*    No results found for this or any previous visit (from the past 240 hour(s)).    Studies: No results found.  Scheduled Meds:  arformoterol  15 mcg Nebulization BID   baclofen  5 mg Per Tube TID  budesonide  0.5 mg Nebulization BID   chlorhexidine gluconate (MEDLINE KIT)  15 mL Mouth Rinse BID   Chlorhexidine Gluconate Cloth  6 each Topical Daily   clonazePAM  0.5 mg Per Tube BID   clopidogrel  75 mg Per Tube Daily   enoxaparin (LOVENOX) injection  0.5 mg/kg Subcutaneous Q24H   famotidine  20 mg Per Tube BID   feeding supplement (PROSource TF)  45 mL Per Tube BID   fiber  1 packet Per Tube BID   free water  200 mL Per Tube Q6H   Gerhardt's butt cream   Topical BID   guaiFENesin  5 mL Per Tube Q6H   hydrocortisone cream   Topical TID   insulin aspart   0-15 Units Subcutaneous Q4H   insulin glargine  40 Units Subcutaneous QHS   levETIRAcetam  2,500 mg Per Tube BID   loratadine  10 mg Per Tube Daily   mouth rinse  15 mL Mouth Rinse 10 times per day   montelukast  10 mg Per Tube Daily   nutrition supplement (JUVEN)  1 packet Per Tube BID   revefenacin  175 mcg Nebulization Daily   tiZANidine  2 mg Per Tube QHS   valproic acid  1,000 mg Per Tube Q12H   Continuous Infusions:  sodium chloride     feeding supplement (JEVITY 1.5 CAL/FIBER) 1,000 mL (12/05/20 0551)    Principal Problem:   Sepsis ([place]) Active Problems:   COPD (chronic obstructive pulmonary disease) (HCC)   Chronic diastolic CHF (congestive heart failure) (HCC)   Acute on chronic respiratory failure with hypoxia (HCC)   GERD (gastroesophageal reflux disease)   Seizures (HCC)   Type 2 diabetes mellitus without complication, with long-term current use of insulin (HCC)   Severe hypoxic-ischemic encephalopathy   Tracheostomy dependence ([place])   Pressure injury of skin   Difficult airway   Ventilator dependence ([place])   Acquired tracheal collapse   Anoxic brain injury [facility])   Consultants: PCCM Palliative medicine Neurology  Procedures: Echocardiogram Replacement of trach  Antibiotics: Cefepime 6/12 through 6/14 Flagyl 6/12 through 6/14 Vancomycin 6/12 through 6/14 Cefepime x1 dose 6/18 Vancomycin x2 doses 6/18, 6/19   Time spent: 45 minutes    Erin Hearing ANP  [facility] 7 am - 330 pm/M-F for direct patient care and secure chat Please refer to Amion for contact info 65  days

## 2020-12-06 DIAGNOSIS — J9621 Acute and chronic respiratory failure with hypoxia: Secondary | ICD-10-CM | POA: Diagnosis not present

## 2020-12-06 DIAGNOSIS — T884XXS Failed or difficult intubation, sequela: Secondary | ICD-10-CM | POA: Diagnosis not present

## 2020-12-06 DIAGNOSIS — G931 Anoxic brain damage, not elsewhere classified: Secondary | ICD-10-CM | POA: Diagnosis not present

## 2020-12-06 DIAGNOSIS — R569 Unspecified convulsions: Secondary | ICD-10-CM | POA: Diagnosis not present

## 2020-12-06 LAB — GLUCOSE, CAPILLARY
Glucose-Capillary: 103 mg/dL — ABNORMAL HIGH (ref 70–99)
Glucose-Capillary: 120 mg/dL — ABNORMAL HIGH (ref 70–99)
Glucose-Capillary: 144 mg/dL — ABNORMAL HIGH (ref 70–99)
Glucose-Capillary: 167 mg/dL — ABNORMAL HIGH (ref 70–99)
Glucose-Capillary: 79 mg/dL (ref 70–99)
Glucose-Capillary: 92 mg/dL (ref 70–99)

## 2020-12-06 LAB — CBC
HCT: 35.6 % — ABNORMAL LOW (ref 36.0–46.0)
Hemoglobin: 11 g/dL — ABNORMAL LOW (ref 12.0–15.0)
MCH: 26.7 pg (ref 26.0–34.0)
MCHC: 30.9 g/dL (ref 30.0–36.0)
MCV: 86.4 fL (ref 80.0–100.0)
Platelets: 306 10*3/uL (ref 150–400)
RBC: 4.12 MIL/uL (ref 3.87–5.11)
RDW: 17.3 % — ABNORMAL HIGH (ref 11.5–15.5)
WBC: 7.9 10*3/uL (ref 4.0–10.5)
nRBC: 0 % (ref 0.0–0.2)

## 2020-12-06 LAB — BASIC METABOLIC PANEL
Anion gap: 6 (ref 5–15)
BUN: 20 mg/dL (ref 8–23)
CO2: 27 mmol/L (ref 22–32)
Calcium: 10.5 mg/dL — ABNORMAL HIGH (ref 8.9–10.3)
Chloride: 106 mmol/L (ref 98–111)
Creatinine, Ser: 0.41 mg/dL — ABNORMAL LOW (ref 0.44–1.00)
GFR, Estimated: >60 mL/min (ref >60)
Glucose, Bld: 134 mg/dL — ABNORMAL HIGH (ref 70–99)
Potassium: 3.8 mmol/L (ref 3.5–5.1)
Sodium: 139 mmol/L (ref 135–145)

## 2020-12-06 MED ORDER — MIDODRINE HCL 5 MG PO TABS
2.5000 mg | ORAL_TABLET | Freq: Three times a day (TID) | ORAL | Status: DC | PRN
  Administered 2020-12-10 (×2): 2.5 mg via NASOGASTRIC
  Filled 2020-12-06 (×2): qty 1

## 2020-12-06 NOTE — Progress Notes (Addendum)
[facility] PROGRESS NOTE  Sonia Drake:[id number] DOB: 1955-11-11 DOA: 09/23/2020 PCP: Garwin Brothers, MD  Status: Remains inpatient appropriate because:Altered mental status, Unsafe d/c plan, and Inpatient level of care appropriate due to severity of illness  Dispo: The patient is from: SNF              Anticipated d/c is to: SNF [facility] but due to the fact she has an adjustable trach and given her recurrent airway issues as described above they have declined to take her this particular facility.              Patient currently is not medically stable to d/c.   Difficult to place patient Yes   Level of care: ICU  Code Status: Full-pulmonary medicine documents paperwork that presents some conflicting information about the patient's wishes Family Communication: Extensive discussion held with daughter Sonia Drake by telephone.  She verbalized understanding that we are coming up on a year since her mother's initial anoxic injury event.  Unfortunately she continues to hold out hope that her mother will improve and that "everything is in God's hands.".  I did discuss with her that sometimes the healing occurs after death and that many times the actions of healthcare providers are potentially interfering with God's plan but no one can know for sure.  She did state that she and her family are discussing what the next steps would be.  I discussed CODE STATUS and again she is unwilling to change her mother from a full code despite explaining the risks of proceeding with a full resuscitation on her mother in her currently debilitated state.  Based on my conversation she seems to be partially accepting this reality but is unwilling to let go of hope that her mother will return to her prior level of functioning despite my discussion with her that this is highly unlikely to occur.  I also discussed worsening of pulmonary status as outlined by critical  care medicine and recent issues with status epilepticus without tonic-clonic activity. DVT prophylaxis: Lovenox COVID vaccination status: Per H&P patient is not vaccinated    HPI: 65 y/o female with medical history significant of asthma/COPD, CHF, cardiac arrest with PEA/CPR with chronic respiratory failure on permanent trach with supplemental oxygen at 6L, type 2 DM, GERD, seizures and severe hypoxic-ischemic encephalopathy who presented to ER via EMS for respiratory distress from SNF.  She presented with findings consistent with acute aspiration pneumonitis.  She decompensated after arrival and required transfer to ICU and initiation of mechanical ventilation.  She had sepsis physiology presumed secondary to [facility] bacteremia but it was later determined that the blood culture results were related to contaminants and patient was no longer determined to be septic   She had previously been ventilator dependent but had been weaned from the ventilator briefly during her stay. On 6/16 a bronchoscopy revealed dynamic collapse of her trachea, tracheostomy was changed from 8.0 XLT cuffless to 6.0 XLT cuffed. She now is requiring chronic ventilatory support 2/2 advanced COPD/asthma and tracheobronchomalacia. PCCM physicians updated patient's daughter on status and poor prognosis along with recommendation for chronic vent.  Palliative medicine has had multiple discussions with the daughter.  Daughter continues to request aggressive care.  Unfortunately she continues to have multiple daily events regarding tracheal collapse with mucous plugging requiring excessive lavage and suction as well as bag-valve-mask for ventilation  Significant Hospital events: 6/12 admitted from skilled nursing facility after aspiration event  and copious secretions per tracheostomy 6/13 appears comfortable currently on 35% ATC. No distress. WBC ct trending down. No events overnight growing GPC clusters in two of two cultures  6/16  increased work of breathing, upper airway noise, abdominal muscle use.  Bronchoscopy performed as trach exchange cuffless #8 Shiley XLT showed dynamic airway collapse, tracheal collapse. 6/16 continued respiratory distress moved to ICU, changed to cuffed #6 Shiley XLT, placed on MV with some improved comfort 6/12 cefepime > 6/14, 6/18 6/12 vanc > 6/14, 6/18 6/23 mucus plugging, severe vent dyssynchrony 6/24-25 intermittent jerking, seizures on EEG 6/27 weaned off propofol gtt 6/28 briefly tolerated trach collar in AM 6/29 tolerated trach collar in AM; no further seizure like activity; working with CM/SW for vent-SNF 7/1 Weaning on CPAP/ PS 10/5, CM SW working on [employer], NO seizure activity, secretions are not an issue per nursing 7/11 trach changed to #8 Bivona with air cuff, much improved. 7/12 TCT 12 hours; rested on Vent overnight 7/13 on TCT 7/23 Trach collar tolerated 7/24 Full vent support 7/26>> Remains on full vent support this am, no issues with secretions per nursing 8/5 occluding trach with dynamic collapse: seen on bronchoscopy 8/6 called back emergently to bedside for tracheostomy occlusion: emergent bronch, bivona trach migrated back 2cm, improved with advancing to 2cm above carina 8/8 collapse caused hypoxia. Improved with repositioning  8/9 afternoon had another episode of tracheal collapse and occlusion of trach requiring BVM, sedation. Family produced paperwork which seems to have conflicting information about pts wishes  8/11: pt continues to have multiple daily episodes of tracheal collapse/mucous plugging req lavage, BVM. Repeated discussions with family re: code status esp in light of paperwork  8/15 consultation placed with ethics committee to evaluate regarding futility of care  Subjective: Remains unresponsive.  Today eyes are not open.  Objective: Vitals:   12/06/20 0738 12/06/20 0753  BP:  (!) 129/99  Pulse:  77  Resp:  (!) 26  Temp: 97.6 F (36.4 C)    SpO2:  100%    Intake/Output Summary (Last 24 hours) at 12/06/2020 0808 Last data filed at 12/06/2020 0600 Gross per 24 hour  Intake 1979.69 ml  Output 900 ml  Net 1079.69 ml   Filed Weights   12/03/20 0321 12/04/20 0442 12/06/20 0212  Weight: 87.7 kg 87.5 kg 87 kg    Exam:  Constitutional: NAD, nonverbal secondary to altered mentation Respiratory: 8.0 cuffed Bivana air trach, ventilator settings: PRVC mode with a VT of 400, rate 16, PEEP 5, FiO2 40%; lung sounds coarse but clear to auscultation with respiratory effort mediated by ventilator alone-no spontaneous respirations noted migration Cardiovascular: Regular rate and maintaining sinus rhythm, no murmurs / rubs / gallops. No extremity edema.   Abdomen: no tenderness, no masses palpated. Bowel sounds positive.  PEG tube for feedings. LBM 8/16 Skin: Stage II decubitus of sacrum-not examined today Neurologic: Unable to accurately test cranial nerves, exam nonfocal.  Extremities are stiff with PROM.  Not awake and is nonverbal.  No current or previous response to painful stimulation when applied to extremities Psychiatric: Eyes not open today.  Previously documented no blink reflex.   Assessment/Plan: Acute problems: Acute on chronic respiratory failure w/ hypoxemia Tracheostomy/vent dependence 2/2 Tracheobronchomalcia and advanced COPD/asthma Not a candidate to wean from ventilator or decannulation Continue chest PT and bronchodilators, tracheal suction as needed PCCM physician has previously spoken at length with daughter about chronicity of these medical problems and that patient will always be ventilator dependent.  Daughter  opted to continue current aggressive care Now with an adjustable trach that is limiting discharge options As of 8/11 patient continues to have daily episodes of tracheal collapse/mucous plugging req lavage, BVM.   Known anoxic brain injury after cardiac arrest  Patient without any meaningful  improvement.  Her eyes are open without blink reflex and does not track around the room.  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 Continue low-dose baclofen and Zanaflex Prognosis extremely poor and at this juncture it appears we are approaching futility of care.  Palliative medicine has reached out to the family on multiple occasions but they continue to wish for aggressive care.  Obtain formal ethics team consultation regarding futility of care  Seizure disorder In review of outpatient notes this was present prior to her cardiac arrest Continue Keppra-Had episodic issues related to status epilepticus requiring formal neurological consultation  History of cardiac arrest Review of outpatient cardiology documents from 09/08/2019 revealed no evidence of CAD on cardiac catheterization Patient was treated for cardiac arrest in August 2021 at [facility].  According to EMS records during that admission patient called 911 in context of respiratory failure.  She was gasping for air and then became unresponsive.  Upon EMS arrival patient was pulseless and apneic.  Suspect cardiac arrest precipitated by acute respiratory distress from hypoxemia  History of CVA Continue Plavix   Hyponatremia Resolved after holding free water current sodium 139  Diabetes mellitus 2 on long-term insulin with hyperglycemia Continue Lantus and SSI Hemoglobin A1c 7.0 Current CBGs well-controlled   Hypertension Blood pressure times has been suboptimal therefore beta-blocker discontinued. As of 8/16 patient noted to be in a 4 L negative balance so was given 1 L of IV  fluid Resumed on free water which had been held secondary to hyponatremia-current sodium is 142-follow closely and if sodium begins to drop can use alternative fluids for volume replacement   Chronic diastolic dysfunction Echocardiogram this admission with grade 1 diastolic dysfunction, moderate LV hypertrophy hyperdynamic LV systolic function Currently euvolemic Monitor I/O Continue as needed beta-blocker for elevated heart rate SBP remains suboptimal Not on ACE or ARB secondary to suboptimal blood pressure reading   Chronic protein malnutrition secondary to sequelae from anoxic brain injury/PEG tube dependence Continue current tube feedings with Prosource Nutrition Problem: Increased nutrient needs Etiology: wound healing Signs/Symptoms: estimated needs Interventions: Tube feeding Body mass index is 35.08 kg/m.   Anemia of chronic disease Hemoglobin remained stable around 10  Stage II sacral decubitus Wound / Incision (Open or Dehisced) 10/31/20 (MASD) Moisture Associated Skin Damage Buttocks Bilateral (Active)  Date First Assessed/Time First Assessed: 10/31/20 2100   Wound Type: (MASD) Moisture Associated Skin Damage  Location: Buttocks  Location Orientation: Bilateral  Present on Admission: No    Assessments 10/31/2020  9:20 PM 12/05/2020  8:00 PM  Dressing Type None;Other (Comment) Foam - Lift dressing to assess site every shift  Dressing Status -- Clean;Intact  Dressing Change Frequency -- PRN  Site / Wound Assessment Pink Clean  Treatment Cleansed Cleansed     No Linked orders to display       Data Reviewed: Basic Metabolic Panel: Recent Labs  Lab 11/29/20 0816 12/05/20 0844  NA 140 142  K 3.8 3.9  CL 105 106  CO2 28 29  GLUCOSE 184* 174*  BUN 28* 29*  CREATININE 0.47 0.50  CALCIUM 10.2 10.5*   Liver Function Tests: Recent Labs  Lab 12/05/20 0844  AST 15  ALT 29  ALKPHOS 84  BILITOT 0.6  PROT 6.4*  ALBUMIN 2.6*    No results for input(s): LIPASE,  AMYLASE in the last 168 hours. No results for input(s): AMMONIA in the last 168 hours. CBC: Recent Labs  Lab 11/29/20 0816 12/05/20 0844  WBC 9.1 8.9  NEUTROABS  --  4.8  HGB 10.2* 10.7*  HCT 32.9* 35.4*  MCV 88.2 88.5  PLT 288 296   Cardiac Enzymes: No results for input(s): CKTOTAL, CKMB, CKMBINDEX, TROPONINI in the last 168 hours. BNP (last 3 results) Recent Labs    10/04/20 0051 10/05/20 0049 10/06/20 0548  BNP 145.8* 115.3* 125.4*    ProBNP (last 3 results) No results for input(s): PROBNP in the last 8760 hours.  CBG: Recent Labs  Lab 12/05/20 1512 12/05/20 1936 12/05/20 2307 12/06/20 0331 12/06/20 0736  GLUCAP 146* 149* 139* 92 103*    No results found for this or any previous visit (from the past 240 hour(s)).    Studies: No results found.  Scheduled Meds:  arformoterol  15 mcg Nebulization BID   baclofen  5 mg Per Tube TID   budesonide  0.5 mg Nebulization BID   chlorhexidine gluconate (MEDLINE KIT)  15 mL Mouth Rinse BID   Chlorhexidine Gluconate Cloth  6 each Topical Daily   clonazePAM  0.5 mg Per Tube BID   clopidogrel  75 mg Per Tube Daily   enoxaparin (LOVENOX) injection  0.5 mg/kg Subcutaneous Q24H   famotidine  20 mg Per Tube BID   feeding supplement (PROSource TF)  45 mL Per Tube BID   fiber  1 packet Per Tube BID   free water  200 mL Per Tube Q6H   Gerhardt's butt cream   Topical BID   guaiFENesin  5 mL Per Tube Q6H   hydrocortisone cream   Topical TID   insulin aspart  0-15 Units Subcutaneous Q4H   insulin glargine  40 Units Subcutaneous QHS   levETIRAcetam  2,500 mg Per Tube BID   loratadine  10 mg Per Tube Daily   mouth rinse  15 mL Mouth Rinse 10 times per day   montelukast  10 mg Per Tube Daily   nutrition supplement (JUVEN)  1 packet Per Tube BID   revefenacin  175 mcg Nebulization Daily   tiZANidine  2 mg Per Tube QHS   valproic acid  1,000 mg Per Tube Q12H   Continuous Infusions:  sodium chloride     feeding supplement  (JEVITY 1.5 CAL/FIBER) 1,000 mL (12/05/20 2350)    Principal Problem:   Sepsis ([place]) Active Problems:   COPD (chronic obstructive pulmonary disease) (HCC)   Chronic diastolic CHF (congestive heart failure) (HCC)   Acute on chronic respiratory failure with hypoxia (HCC)   GERD (gastroesophageal reflux disease)   Seizures (HCC)   Type 2 diabetes mellitus without complication, with long-term current use of insulin (HCC)   Severe hypoxic-ischemic encephalopathy   Tracheostomy dependence ([place])   Pressure injury of skin   Difficult airway   Ventilator dependence ([place])   Acquired tracheal collapse   Anoxic brain injury [facility])   Consultants: PCCM Palliative medicine Neurology  Procedures: Echocardiogram Replacement of trach  Antibiotics: Cefepime 6/12 through 6/14 Flagyl 6/12 through 6/14 Vancomycin  6/12 through 6/14 Cefepime x1 dose 6/18 Vancomycin x2 doses 6/18, 6/19   Time spent: 45 minutes    Erin Hearing ANP  [facility] 7 am - 330 pm/M-F for direct patient care and secure chat Please refer to Amion for contact info 66  days

## 2020-12-06 NOTE — Ethics Note (Signed)
Ethics Consult Note  Initial contact w/ medical team 12/06/20, which was on patient's hospital day 66   Source of Consult: Junious Silk, NP Current attending physician/service: Albertine Grates, MD  Reason(s) for consult and ethical question(s): Medical futility - patient not improving with treatment Surrogate decision maker - daughter is [facility], she has requested aggressive measures and full code.    Information-gathering: Discussion with source of consult Chart review 12/06/20  Narrative:  Medical facts: Unfortunate chronically ill patient who currently is ventilatory dependent and suffering from tracheal collapse requiring lavage and BVM.  Patient's Personal/Social Facts: Resides in SNF, daughter is NOK, patient has a sister listed as contact information as well. Per Revonda Standard, the daughter has not been available for discussion for awhile.    With regard to the applicable underlying ethical principles, the standards of ethical care and relevant resources were discussed directly with Sheralyn Boatman and I will make efforts to reach the attending physician to review/reiterate these as well.  Ethics committee strives to ensure that all necessary and appropriate steps are taken, such that all decisions made for this patient are ethically justifiable. Ethics offers the following recommendations:     Recommendations:     1) If earnest, good-faith attempts have been made to achieve shared decision-making between the medical team and the patient's family/surrogate decision-maker(s), but shared decision-making is not possible (in other words, family/surrogate continue to disagree with medical recommendations), then the attending physician (in cooperation with at least one other physician consultant) is ethically justified in withdrawing or in not offering treatments IF those physicians are convinced that such measures are futile, i.e. will not improve patient's QOL, will serve no medical benefit, especially  if those treatments may in fact cause harm or pain.    2) If patient's daughter is not reasonably available for consultation regarding shared decision-making, please confirm with social work if there is another party who can serve as proxy. Patient has a sister. Uncertain if other adult children, siblings.  See: [facility] Statutes Chapter 90. Medicine and Allied Occupations  90-21.13. Informed consent to health care treatment or procedure. In brief summary, this statute outlines that the following persons, in order indicated, are authorized to consent to medical treatment on behalf of the patient who does not demonstrate capacity to do so: Legally assigned guardian appointed by the court > healthcare agent appointed by legal healthcare power of attorney > healthcare agent appointed by the patient > legal spouse > majority of the patient's reasonably available parents and children who are at least 60 years of age > individual who has an established relationship with the patient, who is acting in good faith on behalf of the patient, and who can reliably convey the patient's wishes > attending physician with confirmation by another physician of the patient's condition and the necessity for treatment (unless in urgent/emergent situation)   3) If the family/surrogate(s) insists upon treatments which the medical team considers futile, those surrogates have the right to attempt transfer to a facility that agrees to provide those treatments. (Of note, there is no designated time allowance frame for transfer, but it is advised to set clear expectations as much as possible. Family ought to be making demonstrable efforts at transfer if transfer is desired. It is reasonable for attending physicians to use their best judgment to determine deadlines for transfer, or deadlines for withdrawal of life support if transfer attempts are not successful. It is reasonable to keep patients on life support for a limited  time to allow family to gather at bedside prior to withdrawal of life support.)   4) If there appear to be room to attempt shared decision making with the patient's surrogates with the involvement of Ethics, Ethics will make 2+ committee members available for a meeting between Ethics representatives, the medical team and the patient's decision-makers (daughter, +/- sister, and they maybe permitted ONE other support person) to explore this more. Unit manager will be asked to arrange a time/place that is agreeable to all relevant parties. This meeting should limit its attendees to the attending physician, relevant consultants including members of the ethics committee, relevant nursing staff and other medical staff as appropriate, and patient's legal surrogate decision-maker(s) or other support parties who are authorized to receive HIPAA-protected information.       Thank you for this consult. Ethics will continue to follow this case.   Junious Silk, NP has my personal cell phone number and has my permission to share this number at their discretion.  Secure message on Epic is also welcome but may not receive an immediate response.  Please reference AMION for on-call committee member if needed.    Sunnie Nielsen [facility] Ethics Committee

## 2020-12-07 ENCOUNTER — Inpatient Hospital Stay (HOSPITAL_COMMUNITY): Payer: Medicare Other

## 2020-12-07 DIAGNOSIS — G931 Anoxic brain damage, not elsewhere classified: Secondary | ICD-10-CM | POA: Diagnosis not present

## 2020-12-07 DIAGNOSIS — J398 Other specified diseases of upper respiratory tract: Secondary | ICD-10-CM | POA: Diagnosis not present

## 2020-12-07 DIAGNOSIS — J9621 Acute and chronic respiratory failure with hypoxia: Secondary | ICD-10-CM | POA: Diagnosis not present

## 2020-12-07 DIAGNOSIS — A419 Sepsis, unspecified organism: Secondary | ICD-10-CM | POA: Diagnosis not present

## 2020-12-07 DIAGNOSIS — R569 Unspecified convulsions: Secondary | ICD-10-CM | POA: Diagnosis not present

## 2020-12-07 DIAGNOSIS — T884XXS Failed or difficult intubation, sequela: Secondary | ICD-10-CM | POA: Diagnosis not present

## 2020-12-07 LAB — GLUCOSE, CAPILLARY
Glucose-Capillary: 127 mg/dL — ABNORMAL HIGH (ref 70–99)
Glucose-Capillary: 160 mg/dL — ABNORMAL HIGH (ref 70–99)
Glucose-Capillary: 88 mg/dL (ref 70–99)
Glucose-Capillary: 146 mg/dL — ABNORMAL HIGH (ref 70–99)
Glucose-Capillary: 148 mg/dL — ABNORMAL HIGH (ref 70–99)

## 2020-12-07 MED ORDER — METHYLPREDNISOLONE SODIUM SUCC 125 MG IJ SOLR
125.0000 mg | Freq: Once | INTRAMUSCULAR | Status: AC
  Administered 2020-12-07: 125 mg via INTRAVENOUS
  Filled 2020-12-07: qty 2

## 2020-12-07 NOTE — Progress Notes (Signed)
eLink Physician-Brief Progress Note Patient Name: Sonia Drake DOB: 1955/12/22 MRN: [id number]   Date of Service  12/07/2020  HPI/Events of Note  CXR reviewed. No pneumothorax, no acute disease.  eICU Interventions  No intervention indicated.        Thomasene Lot Roshon Duell 12/07/2020, 6:10 AM

## 2020-12-07 NOTE — Progress Notes (Addendum)
eLink Physician-Brief Progress Note Patient Name: Sonia Drake DOB: 15-Oct-1955 MRN: [id number]   Date of Service  12/07/2020  HPI/Events of Note  Patient appears in respiratory distress probably secondary to bronchospasm, bedside RN reports she is moving very little air.  eICU Interventions  Solumedrol 125 mg iv x 1 ordered, stat Duonebs ordered, stat portable CXR ordered to r/o pneumothorax.  ADDENDUM Tracheostomy tube was advanced slightly with apparent resolution of respiratory distress. Will review pending CXR films.        Thomasene Lot Sonia Drake 12/07/2020, 5:27 AM

## 2020-12-07 NOTE — Progress Notes (Signed)
CSW spoke with Clydie Braun at [facility] who states the facility cannot accept the patient due to her having a latex allergy.  [name], MSW, LCSW Transitions of Care  Clinical Social Worker II [phone]

## 2020-12-07 NOTE — Progress Notes (Signed)
[facility] PROGRESS NOTE  Sonia Drake MNO:[id number] DOB: [birth year] DOA: 10/02/2020 PCP: Garwin Brothers, MD  Status: Remains inpatient appropriate because:Altered mental status, Unsafe d/c plan, and Inpatient level of care appropriate due to severity of illness  Dispo: The patient is from: SNF              Anticipated d/c is to: [facility] but due to the fact she has an adjustable trach and given her recurrent airway issues as described above they have declined to take her this particular facility.              Patient currently is not medically stable to d/c.   Difficult to place patient Yes   Level of care: ICU  Code Status: Full-pulmonary medicine documents paperwork that presents some conflicting information about the patient's wishes Family Communication: Spoke with patient's daughter in detail on 8/17.  Discussed CODE STATUS as well as futility of care.  Daughter not yet ready to change mother to DNR or advance to comfort measures DVT prophylaxis: Lovenox COVID vaccination status: Per H&P patient is not vaccinated    HPI: 65 y/o female with medical history significant of asthma/COPD, CHF, cardiac arrest with PEA/CPR with chronic respiratory failure on permanent trach with supplemental oxygen at 6L, type 2 DM, GERD, seizures and severe hypoxic-ischemic encephalopathy who presented to ER via EMS for respiratory distress from SNF.  She presented with findings consistent with acute aspiration pneumonitis.  She decompensated after arrival and required transfer to ICU and initiation of mechanical ventilation.  She had sepsis physiology presumed secondary to [facility] bacteremia but it was later determined that the blood culture results were related to contaminants and patient was no longer determined to be septic   She had previously been ventilator dependent but had been weaned from the ventilator briefly during her stay. On 6/16  a bronchoscopy revealed dynamic collapse of her trachea, tracheostomy was changed from 8.0 XLT cuffless to 6.0 XLT cuffed. She now is requiring chronic ventilatory support 2/2 advanced COPD/asthma and tracheobronchomalacia. PCCM physicians updated patient's daughter on status and poor prognosis along with recommendation for chronic vent.  Palliative medicine has had multiple discussions with the daughter.  Daughter continues to request aggressive care.  Unfortunately she continues to have multiple daily events regarding tracheal collapse with mucous plugging requiring excessive lavage and suction as well as bag-valve-mask for ventilation  Significant Hospital events: 6/12 admitted from skilled nursing facility after aspiration event and copious secretions per tracheostomy 6/13 appears comfortable currently on 35% ATC. No distress. WBC ct trending down. No events overnight growing GPC clusters in two of two cultures  6/16 increased work of breathing, upper airway noise, abdominal muscle use.  Bronchoscopy performed as trach exchange cuffless #8 Shiley XLT showed dynamic airway collapse, tracheal collapse. 6/16 continued respiratory distress moved to ICU, changed to cuffed #6 Shiley XLT, placed on MV with some improved comfort 6/12 cefepime > 6/14, 6/18 6/12 vanc > 6/14, 6/18 6/23 mucus plugging, severe vent dyssynchrony 6/24-25 intermittent jerking, seizures on EEG 6/27 weaned off propofol gtt 6/28 briefly tolerated trach collar in AM 6/29 tolerated trach collar in AM; no further seizure like activity; working with CM/SW for vent-SNF 7/1 Weaning on CPAP/ PS 10/5, CM SW working on [employer], NO seizure activity, secretions are not an issue per nursing 7/11 trach changed to #8 Bivona with air cuff, much improved. 7/12 TCT 12 hours; rested on Vent overnight 7/13  on TCT 7/23 Trach collar tolerated 7/24 Full vent support 7/26>> Remains on full vent support this am, no issues with secretions per  nursing 8/5 occluding trach with dynamic collapse: seen on bronchoscopy 8/6 called back emergently to bedside for tracheostomy occlusion: emergent bronch, bivona trach migrated back 2cm, improved with advancing to 2cm above carina 8/8 collapse caused hypoxia. Improved with repositioning  8/9 afternoon had another episode of tracheal collapse and occlusion of trach requiring BVM, sedation. Family produced paperwork which seems to have conflicting information about pts wishes  8/11: pt continues to have multiple daily episodes of tracheal collapse/mucous plugging req lavage, BVM. Repeated discussions with family re: code status esp in light of paperwork  8/15 consultation placed with ethics committee to evaluate regarding futility of care 8/17 spoke at length with patient's daughter regarding futility of care CODE STATUS.  Daughter continues to desire aggressive measures.  Subjective: Remains unresponsive even when face washed and given a bath.  Objective: Vitals:   12/07/20 0600 12/07/20 0700  BP: 116/78   Pulse: 92   Resp: 18   Temp:  97.7 F (36.5 C)  SpO2: 100%     Intake/Output Summary (Last 24 hours) at 12/07/2020 0759 Last data filed at 12/07/2020 0600 Gross per 24 hour  Intake 900 ml  Output 601 ml  Net 299 ml   Filed Weights   12/04/20 0442 12/06/20 0212 12/07/20 0500  Weight: 87.5 kg 87 kg 87.2 kg    Exam:  Constitutional: NAD, nonverbal secondary to unresponsive status Respiratory: 8.0 cuffed Bivana air trach, ventilator settings: PRVC mode with a VT of 400, rate 16, PEEP 5, FiO2 40%-lungs are clear to auscultation bilaterally and no increased work of breathing noted Cardiovascular: Regular rate and maintaining sinus rhythm, no murmurs / rubs / gallops. No extremity edema. 2+ pedal pulses.  Abdomen: no tenderness, no masses palpated. Bowel sounds positive.  PEG tube for feedings. LBM 8/13 Skin: Stage II decubitus of sacrum-not examined today Neurologic: Unable to  accurately test cranial nerves, exam nonfocal.  Extremities are stiff with PROM.  Not awake and is nonverbal.  At baseline has no response to pain stimulus and has no blink reflex. Psychiatric: Eyes closed.  Does not open eyes even when face washed arm other aspects of bath administered.     Assessment/Plan: Acute problems: Acute on chronic respiratory failure w/ hypoxemia Tracheostomy/vent dependence 2/2 Tracheobronchomalcia and advanced COPD/asthma Not a candidate to wean from ventilator or decannulation Continue chest PT and bronchodilators, tracheal suction as needed PCCM physician spoke at length with daughter about chronicity of these medical problems and that patient will always be ventilator dependent.  Daughter opted to continue current aggressive care.  I also spoke at length with daughter on 8/17 and she wishes to continue aggressive care  Overnight and 8/18 patient had increased work of breathing and respiratory distress.  Chest x-ray was unremarkable.  Symptoms improved with slight advancement of trach by [facility] physician Now with an adjustable trach that is limiting discharge options As of 8/11 patient continues to have daily episodes of tracheal collapse/mucous plugging req lavage, BVM.   Known anoxic brain injury after cardiac arrest  Patient without any meaningful improvement.  Her eyes are open without blink reflex and does not track around the room.  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 Continue low-dose baclofen and Zanaflex Prognosis extremely poor and at this juncture it appears we are approaching futility of care.  Palliative medicine has reached out to the family on multiple occasions  but they continue to wish for aggressive care. Appreciate assistance of ethics team regarding futility of care concerns  Seizure disorder In review of outpatient notes this was present prior to her cardiac arrest Continue Keppra-Had episodic issues related to status epilepticus requiring formal neurological consultation  History of cardiac arrest Review of outpatient cardiology documents from 09/08/2019 revealed no evidence of CAD Patient was treated for cardiac arrest in August 2021 at [facility].  According to EMS records during that admission patient called 911 in context of respiratory failure.  She was gasping for air and then became unresponsive.  Upon EMS arrival patient was pulseless and apneic.  History of CVA Continue Plavix   Hyponatremia Resolved after holding free water current sodium 139  Diabetes mellitus 2 on long-term insulin with hyperglycemia Continue Lantus and SSI Hemoglobin A1c 7.0 Current CBGs well-controlled   Hypertension Blood pressure times has been suboptimal therefore beta-blocker discontinued. As of 8/16 patient noted to be in a 4 L negative balance so was given 1 L of IV fluid Resumed on free water which had been held secondary to hyponatremia-current sodium is 142-follow closely and if sodium begins to drop can use alternative fluids for volume replacement   Chronic diastolic dysfunction Echocardiogram this admission with grade 1 diastolic dysfunction, moderate LV hypertrophy hyperdynamic LV systolic function Currently euvolemic Monitor I/O Continue as needed beta-blocker for elevated heart rate SBP remains suboptimal Not on ACE or ARB secondary to suboptimal blood pressure reading   Chronic protein malnutrition secondary to sequelae from anoxic brain injury/PEG tube dependence Continue current tube feedings with Prosource Nutrition Problem: Increased nutrient needs Etiology: wound healing Signs/Symptoms: estimated  needs Interventions: Tube feeding Body mass index is 35.16 kg/m.   Anemia of chronic disease Hemoglobin remained stable around 10  Stage II sacral decubitus Wound / Incision (Open or Dehisced) 10/31/20 (MASD) Moisture Associated Skin Damage Buttocks Bilateral (Active)  Date First Assessed/Time First Assessed: 10/31/20 2100   Wound Type: (MASD) Moisture Associated Skin Damage  Location: Buttocks  Location Orientation: Bilateral  Present on Admission: No    Assessments 10/31/2020  9:20 PM 12/06/2020  8:00 PM  Dressing Type None;Other (Comment) Foam - Lift dressing to assess site every shift  Dressing Status -- Clean;Dry;Intact  Dressing Change Frequency -- PRN  Site / Wound Assessment Pink Clean  Peri-wound Assessment -- Intact;Erythema (blanchable)  Margins -- Unattached edges (unapproximated)  Closure -- None  Drainage Amount -- Minimal  Drainage Description -- Serosanguineous  Non-staged Wound Description -- Partial thickness  Treatment Cleansed Cleansed     No Linked orders to display       Data Reviewed: Basic Metabolic Panel: Recent Labs  Lab 12/05/20 0844 12/06/20 0834  NA 142 139  K 3.9 3.8  CL 106 106  CO2 29 27  GLUCOSE 174* 134*  BUN 29* 20  CREATININE 0.50 0.41*  CALCIUM 10.5* 10.5*   Liver Function Tests: Recent Labs  Lab 12/05/20 0844  AST 15  ALT 29  ALKPHOS 84  BILITOT 0.6  PROT 6.4*  ALBUMIN 2.6*    No results for input(s): LIPASE, AMYLASE in the last 168 hours. No results for input(s): AMMONIA in the last 168 hours. CBC: Recent Labs  Lab 12/05/20  8768 12/06/20 0834  WBC 8.9 7.9  NEUTROABS 4.8  --   HGB 10.7* 11.0*  HCT 35.4* 35.6*  MCV 88.5 86.4  PLT 296 306   Cardiac Enzymes: No results for input(s): CKTOTAL, CKMB, CKMBINDEX, TROPONINI in the last 168 hours. BNP (last 3 results) Recent Labs    10/04/20 0051 10/05/20 0049 10/06/20 0548  BNP 145.8* 115.3* 125.4*    ProBNP (last 3 results) No results for input(s): PROBNP  in the last 8760 hours.  CBG: Recent Labs  Lab 12/06/20 1532 12/06/20 1920 12/06/20 2337 12/07/20 0328 12/07/20 0727  GLUCAP 79 144* 167* 127* 160*    No results found for this or any previous visit (from the past 240 hour(s)).    Studies: DG CHEST PORT 1 VIEW  Result Date: 12/07/2020 CLINICAL DATA:  Respiratory distress. EXAM: PORTABLE CHEST 1 VIEW COMPARISON:  Chest x-ray dated November 25, 2020. FINDINGS: Unchanged tracheostomy tube. The heart size and mediastinal contours are within normal limits. No focal consolidation, pleural effusion, or pneumothorax. No acute osseous abnormality. IMPRESSION: No active disease. Electronically Signed   By: Titus Dubin M.D.   On: 12/07/2020 05:56    Scheduled Meds:  arformoterol  15 mcg Nebulization BID   baclofen  5 mg Per Tube TID   budesonide  0.5 mg Nebulization BID   chlorhexidine gluconate (MEDLINE KIT)  15 mL Mouth Rinse BID   Chlorhexidine Gluconate Cloth  6 each Topical Daily   clonazePAM  0.5 mg Per Tube BID   clopidogrel  75 mg Per Tube Daily   enoxaparin (LOVENOX) injection  0.5 mg/kg Subcutaneous Q24H   famotidine  20 mg Per Tube BID   feeding supplement (PROSource TF)  45 mL Per Tube BID   fiber  1 packet Per Tube BID   free water  200 mL Per Tube Q6H   Gerhardt's butt cream   Topical BID   guaiFENesin  5 mL Per Tube Q6H   hydrocortisone cream   Topical TID   insulin aspart  0-15 Units Subcutaneous Q4H   insulin glargine  40 Units Subcutaneous QHS   levETIRAcetam  2,500 mg Per Tube BID   loratadine  10 mg Per Tube Daily   mouth rinse  15 mL Mouth Rinse 10 times per day   montelukast  10 mg Per Tube Daily   nutrition supplement (JUVEN)  1 packet Per Tube BID   revefenacin  175 mcg Nebulization Daily   tiZANidine  2 mg Per Tube QHS   valproic acid  1,000 mg Per Tube Q12H   Continuous Infusions:  sodium chloride     feeding supplement (JEVITY 1.5 CAL/FIBER) 1,000 mL (12/07/20 1157)    Principal Problem:   Sepsis  ([employer]) Active Problems:   COPD (chronic obstructive pulmonary disease) (HCC)   Chronic diastolic CHF (congestive heart failure) (HCC)   Acute on chronic respiratory failure with hypoxia (HCC)   GERD (gastroesophageal reflux disease)   Seizures (HCC)   Type 2 diabetes mellitus without complication, with long-term current use of insulin (HCC)   Severe hypoxic-ischemic encephalopathy   Tracheostomy dependence ([place])   Pressure injury of skin   Difficult airway   Ventilator dependence ([place])   Acquired tracheal collapse   Anoxic brain injury [facility])   Consultants: PCCM Palliative medicine Neurology  Procedures: Echocardiogram Replacement of trach  Antibiotics: Cefepime 6/12 through 6/14 Flagyl 6/12 through 6/14 Vancomycin 6/12 through 6/14 Cefepime x1 dose 6/18 Vancomycin x2 doses 6/18, 6/19   Time spent: 45  minutes    Erin Hearing ANP  [facility] 7 am - 330 pm/M-F for direct patient care and secure chat Please refer to Amion for contact info 67  days

## 2020-12-07 NOTE — Progress Notes (Signed)
NAME:  ANDIE MUNGIN, MRN:  [id number], DOB:  1955-07-13, LOS: 67 ADMISSION DATE:  09/22/2020, CONSULTATION DATE:  6/15 REFERRING MD:  Thedore Mins, CHIEF COMPLAINT:  Dyspnea   History of Present Illness:  65 y/o female with anoxic brain injury, tracheostomy status who was admitted from a SNF on 6/12 with worsening respiratory failure.  She had previously been ventilator dependent but had been weaned from the trach.  On 6/16 a bronchoscopy revealed dynamic collapse of her trachea, tracheostomy was replaced.  Later required mechanical ventilation.  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 No distress. WBC ct trending down. No events overnight growing GPC clusters in two of two cultures  6/16 increased work of breathing, upper airway noise, abdominal muscle use.  Bronchoscopy performed as trach exchange cuffless #8 Shiley XLT showed dynamic airway collapse, tracheal collapse. 6/16 continued respiratory distress moved to ICU, changed to cuffed #6 Shiley XLT, placed on MV with some improved comfort 6/12 cefepime > 6/14, 6/18 6/12 vanc > 6/14, 6/18 6/23 mucus plugging, severe vent dyssynchrony 6/24-25 intermittent jerking, seizures on EEG 6/27 weaned off propofol gtt 6/28 briefly tolerated trach collar in AM 6/29 tolerated trach collar in AM; no further seizure like activity; working with CM/SW for vent-SNF 7/1 Weaning on CPAP/ PS 10/5, CM SW working on [employer], NO seizure activity, secretions are not an issue per  nursing 7/11 trach changed to #8 Bivona with air cuff, much improved. 7/12 TCT 12 hours; rested on Vent overnight 7/13 on TCT 7/23 Trach collar tolerated 7/24 Full vent support 7/26>> Remains on full vent support this am, no issues with secretions per nursing 8/5 occluding trach with dynamic collapse: seen on bronchoscopy 8/6 called back emergently to bedside for tracheostomy occlusion: emergent bronch, bivona trach migrated back 2cm, improved with advancing to 2cm above carina 8/8 collapse caused hypoxia. Improved with repositioning  8/9 afternoon had another episode of tracheal collapse and occlusion of trach requiring BVM, sedation. Family produced paperwork which seems to have conflicting information about pts wishes  8/11: pt continues to have multiple daily episodes of tracheal collapse/mucous plugging req lavage, BVM. Repeated discussions with family re: code status esp in light of papwerwork  Interim History / Subjective:  Had episode of respiratory distress overnight improved with advancing trach collar.  Ethics consult noted.  Objective   Blood pressure 122/83, pulse 93, temperature 97.6 F (36.4 C), temperature source Oral, resp. rate (!) 21, height 5\' 2"  (1.575 m), weight 87.2 kg, SpO2 100 %.    Vent Mode: PRVC FiO2 (%):  [30 %] 30 % Set Rate:  [16 bmp] 16 bmp Vt Set:  [400 mL] 400 mL PEEP:  [5 cmH20] 5 cmH20 Plateau Pressure:  [15 cmH20-21 cmH20] 15 cmH20   Intake/Output Summary (Last 24 hours) at 12/07/2020 1635 Last data filed at 12/07/2020 1100 Gross per 24 hour  Intake 720 ml  Output 475 ml  Net 245 ml    Filed Weights   12/04/20 0442 12/06/20 0212 12/07/20 0500  Weight: 87.5 kg 87 kg 87.2 kg    Examination:  No distress Not opening eyes for  me Small thick secretions from trach Has decorticate  response to painful stimuli Some cranial nerves are intact: has cough, pupillary, corneal reflexes  Resolved Hospital Problem list     Assessment & Plan:    Anoxic encephalopathy / anoxic brain injury Acute on chronic respiratory failure w hypercarbia and hypoxia  Severe tracheobronchomalacia Trach status Hx COPD  -Recurrent episodes of desats with hyperdynamic collapse of airway in spite of increasing PEEP and repositioning trach -etiology of trach occlusion, dynamic airway collapse. Associated severe dyssynchrony  Plan: Vent/trach dependent - no weaning  Working on vent SNF placement  Continue bivona trach  Ongoing GOC talks Will follow intermittently, next Monday  Myrla Halsted MD PCCM

## 2020-12-08 ENCOUNTER — Inpatient Hospital Stay (HOSPITAL_COMMUNITY): Payer: Medicare Other

## 2020-12-08 DIAGNOSIS — G931 Anoxic brain damage, not elsewhere classified: Secondary | ICD-10-CM | POA: Diagnosis not present

## 2020-12-08 DIAGNOSIS — J398 Other specified diseases of upper respiratory tract: Secondary | ICD-10-CM | POA: Diagnosis not present

## 2020-12-08 DIAGNOSIS — J9621 Acute and chronic respiratory failure with hypoxia: Secondary | ICD-10-CM | POA: Diagnosis not present

## 2020-12-08 DIAGNOSIS — R569 Unspecified convulsions: Secondary | ICD-10-CM | POA: Diagnosis not present

## 2020-12-08 LAB — GLUCOSE, CAPILLARY
Glucose-Capillary: 129 mg/dL — ABNORMAL HIGH (ref 70–99)
Glucose-Capillary: 136 mg/dL — ABNORMAL HIGH (ref 70–99)
Glucose-Capillary: 142 mg/dL — ABNORMAL HIGH (ref 70–99)
Glucose-Capillary: 142 mg/dL — ABNORMAL HIGH (ref 70–99)
Glucose-Capillary: 161 mg/dL — ABNORMAL HIGH (ref 70–99)
Glucose-Capillary: 175 mg/dL — ABNORMAL HIGH (ref 70–99)

## 2020-12-08 NOTE — Procedures (Signed)
Patient Name: Sonia Drake  MRN: [id number]  Epilepsy Attending: Charlsie Quest  Referring Physician/Provider: Junious Silk, NP Date: 12/08/2020 Duration: 26.07 mins  Patient history:  65yo F with h/o anoxic brain injury now with seizure like episodes. EEG to evaluate for seizure  Level of alertness: lethargic   AEDs during EEG study: LEV, VPA  Technical aspects: This EEG study was done with scalp electrodes positioned according to the 10-20 International system of electrode placement. Electrical activity was acquired at a sampling rate of 500Hz  and reviewed with a high frequency filter of 70Hz  and a low frequency filter of 1Hz . EEG data were recorded continuously and digitally stored.   Description: No posterior dominant rhythm was seen. EEG showed continuous generalized 5 to 6 Hz theta as well as intermittent 2-3hz  delta slowing. There was also frontocentral 15-18Hz  beta activity. Generalized polyspikes were noted which at times appeared qasi-periodic at 0.25-1hz . Hyperventilation and photic stimulation were not performed.     ABNORMALITY -Polyspikes, generalized - Continuous slow, generalized  IMPRESSION: This study showed evidence of epileptogenicity with generalized onset. There is also moderate diffuse encephalopathy, nonspecific etiology but likely related to anoxic/hypoxic brain injury. No definite seizures were seen throughout the recording.  Benjaman Artman 

## 2020-12-08 NOTE — Progress Notes (Addendum)
[facility] PROGRESS NOTE  Sonia Drake EYC:[id number] DOB: Oct 29, 1955 DOA: 10/19/2020 PCP: Garwin Brothers, MD  Status: Remains inpatient appropriate because:Altered mental status, Unsafe d/c plan, and Inpatient level of care appropriate due to severity of illness  Dispo: The patient is from: SNF              Anticipated d/c is to: SN[facility] [place] but due to the fact she has an adjustable trach and given her recurrent airway issues as described above they have declined to take her this particular facility.                Patient currently is not medically stable to d/c.   Difficult to place patient Yes   Level of care: ICU  Code Status: Full-pulmonary medicine documents paperwork that presents some conflicting information about the patient's wishes Family Communication: Spoke with patient's daughter in detail on 8/17.  Discussed CODE STATUS as well as futility of care.  Daughter not yet ready to change mother to DNR or advance to comfort measures DVT prophylaxis: Lovenox COVID vaccination status: Per H&P patient is not vaccinated    HPI: 65 y/o female with medical history significant of asthma/COPD, CHF, cardiac arrest with PEA/CPR with chronic respiratory failure on permanent trach with supplemental oxygen at 6L, type 2 DM, GERD, seizures and severe hypoxic-ischemic encephalopathy who presented to ER via EMS for respiratory distress from SNF.  She presented with findings consistent with acute aspiration pneumonitis.  She decompensated after arrival and required transfer to ICU and initiation of mechanical ventilation.  She had sepsis physiology presumed secondary to [facility] bacteremia but it was later determined that the blood culture results were related to contaminants and patient was no longer determined to be septic   She had previously been ventilator dependent but had been weaned from the ventilator briefly during her stay. On  6/16 a bronchoscopy revealed dynamic collapse of her trachea, tracheostomy was changed from 8.0 XLT cuffless to 6.0 XLT cuffed. She now is requiring chronic ventilatory support 2/2 advanced COPD/asthma and tracheobronchomalacia. PCCM physicians updated patient's daughter on status and poor prognosis along with recommendation for chronic vent.  Palliative medicine has had multiple discussions with the daughter.  Daughter continues to request aggressive care.  Unfortunately she continues to have multiple daily events regarding tracheal collapse with mucous plugging requiring excessive lavage and suction as well as bag-valve-mask for ventilation  Significant Hospital events: 6/12 admitted from skilled nursing facility after aspiration event and copious secretions per tracheostomy 6/13 appears comfortable currently on 35% ATC. No distress. WBC ct trending down. No events overnight growing GPC clusters in two of two cultures  6/16 increased work of breathing, upper airway noise, abdominal muscle use.  Bronchoscopy performed as trach exchange cuffless #8 Shiley XLT showed dynamic airway collapse, tracheal collapse. 6/16 continued respiratory distress moved to ICU, changed to cuffed #6 Shiley XLT, placed on MV with some improved comfort 6/12 cefepime > 6/14, 6/18 6/12 vanc > 6/14, 6/18 6/23 mucus plugging, severe vent dyssynchrony 6/24-25 intermittent jerking, seizures on EEG 6/27 weaned off propofol gtt 6/28 briefly tolerated trach collar in AM 6/29 tolerated trach collar in AM; no further seizure like activity; working with CM/SW for vent-SNF 7/1 Weaning on CPAP/ PS 10/5, CM SW working on [employer], NO seizure activity, secretions are not an issue per nursing 7/11 trach changed to #8 Bivona with air cuff, much improved. 7/12 TCT 12 hours; rested on Vent  overnight 7/13 on TCT 7/23 Trach collar tolerated 7/24 Full vent support 7/26>> Remains on full vent support this am, no issues with secretions per  nursing 8/5 occluding trach with dynamic collapse: seen on bronchoscopy 8/6 called back emergently to bedside for tracheostomy occlusion: emergent bronch, bivona trach migrated back 2cm, improved with advancing to 2cm above carina 8/8 collapse caused hypoxia. Improved with repositioning  8/9 afternoon had another episode of tracheal collapse and occlusion of trach requiring BVM, sedation. Family produced paperwork which seems to have conflicting information about pts wishes  8/11: pt continues to have multiple daily episodes of tracheal collapse/mucous plugging req lavage, BVM. Repeated discussions with family re: code status esp in light of paperwork  8/15 consultation placed with ethics committee to evaluate regarding futility of care 8/17 spoke at length with patient's daughter regarding futility of care CODE STATUS.  Daughter continues to desire aggressive measures.  Subjective: Patient remains unresponsive.  Eyes are open with a continued blinking pattern but no blink reflex detected.  No response to pain.  Objective: Vitals:   12/08/20 0738 12/08/20 0741  BP:    Pulse:    Resp:    Temp:    SpO2: 100% 100%    Intake/Output Summary (Last 24 hours) at 12/08/2020 0744 Last data filed at 12/08/2020 0400 Gross per 24 hour  Intake 1445 ml  Output 1375 ml  Net 70 ml   Filed Weights   12/06/20 0212 12/07/20 0500 12/08/20 0500  Weight: 87 kg 87.2 kg 89.3 kg    Exam:  Constitutional: NAD, nonverbal and unresponsive Respiratory: 8.0 cuffed Bivana air trach, ventilator settings: PRVC mode with a VT of 400, rate 16, PEEP 5, FiO2 30%-lungs are clear to auscultation bilaterally and no increased work of breathing noted Cardiovascular: Regular rate and maintaining sinus rhythm, no murmurs / rubs / gallops. No extremity edema. 2+ pedal pulses.  Abdomen: no tenderness, no masses palpated. Bowel sounds positive.  PEG tube for feedings. LBM 8/13 Skin: Stage II decubitus of sacrum-not examined  today Neurologic: Unable to accurately test cranial nerves although it is noted patient's right eye is deviated to the right; exam nonfocal.  Extremities are stiff with PROM.  Not awake and is nonverbal.  At baseline has no response to pain stimulus and has no blink reflex.  Noted today with repetitive blinking about stimulus Psychiatric: Eyes open.  No response to pain, tactile stimulation or verbal stimulation.  Unresponsive.  Assessment/Plan: Acute problems: Acute on chronic respiratory failure w/ hypoxemia Tracheostomy/vent dependence 2/2 Tracheobronchomalcia and advanced COPD/asthma Not a candidate to wean from ventilator or decannulation Continue chest PT and bronchodilators, tracheal suction as needed PCCM physician spoke at length with daughter about chronicity of these medical problems and that patient will always be ventilator dependent.  Daughter opted to continue current aggressive care.  I also spoke at length with daughter on 8/17 and she wishes to continue aggressive care  Overnight and 8/18 patient had increased work of breathing and respiratory distress.  Chest x-ray was unremarkable.  Symptoms improved with slight advancement of trach by [facility] physician Now with an adjustable trach that is limiting discharge options 8/18 PCCM documented recurrent episodes of desaturation with hyperdynamic collapse of airway in spite of increased PEEP and repositioning of trach.  Because of this recurrent trach occlusion and dynamic airway collapse patient has associated severe dyssynchrony  Known anoxic brain injury after cardiac arrest  Patient without any meaningful improvement.  Her eyes are open without blink reflex and  does not track around the room.  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 Continue low-dose baclofen and Zanaflex Prognosis extremely poor and at this juncture it appears we are approaching futility of care.  Palliative medicine has reached out to the family on multiple occasions but they continue to wish for aggressive care. Appreciate assistance of ethics team regarding futility of care concerns  Seizure disorder In review of outpatient notes this was present prior to her cardiac arrest Continue Keppra-Had episodic issues related to status epilepticus requiring formal neurological consultation Patient with repetitive blinking-possibly a sign she has having another episode of status epilepticus-EEG with run of polyspikes on eeg. likely due to h/o anoxic injury. leaving for ltm per Dr Hortense Ramal  History of cardiac arrest Review of outpatient cardiology documents from 09/08/2019 revealed no evidence of CAD Patient was treated for cardiac arrest in August 2021 at [facility].  According to EMS records during that admission patient called 911 in context of respiratory failure.  She was gasping for air and then became unresponsive.  Upon EMS arrival patient was pulseless and apneic.  History of CVA Continue Plavix   Hyponatremia Resolved after holding free water current sodium 139  Diabetes mellitus 2 on long-term insulin with hyperglycemia Continue Lantus and SSI Hemoglobin A1c 7.0 Current CBGs well-controlled   Hypertension Blood pressure times has been suboptimal therefore beta-blocker discontinued. As of 8/16 patient noted to be in a 4 L negative balance so was given 1 L of IV fluid Resumed on free water which had been held secondary to hyponatremia-current sodium is 142-follow closely and if sodium begins to drop can use alternative fluids for volume replacement   Chronic diastolic dysfunction Echocardiogram this admission with grade 1 diastolic dysfunction,  moderate LV hypertrophy hyperdynamic LV systolic function Currently euvolemic Monitor I/O Continue as needed beta-blocker for elevated heart rate SBP remains suboptimal Not on ACE or ARB secondary to suboptimal blood pressure reading   Chronic protein malnutrition secondary to sequelae from anoxic brain injury/PEG tube dependence Continue current tube feedings with Prosource Nutrition Problem: Increased nutrient needs Etiology: wound healing Signs/Symptoms: estimated needs Interventions: Tube feeding Body mass index is 36.01 kg/m.   Anemia of chronic disease Hemoglobin remained stable around 10  Stage II sacral decubitus Wound / Incision (Open or Dehisced) 10/31/20 (MASD) Moisture Associated Skin Damage Buttocks Bilateral (Active)  Date First Assessed/Time First Assessed: 10/31/20 2100   Wound Type: (MASD) Moisture Associated Skin Damage  Location: Buttocks  Location Orientation: Bilateral  Present on Admission: No    Assessments 10/31/2020  9:20 PM 12/06/2020  8:00 PM  Dressing Type None;Other (Comment) Foam - Lift dressing to assess site every shift  Dressing Status -- Clean;Dry;Intact  Dressing Change Frequency -- PRN  Site / Wound Assessment Pink Clean  Peri-wound Assessment -- Intact;Erythema (blanchable)  Margins -- Unattached edges (unapproximated)  Closure -- None  Drainage Amount -- Minimal  Drainage Description -- Serosanguineous  Non-staged Wound Description -- Partial thickness  Treatment Cleansed Cleansed     No Linked orders to display       Data Reviewed: Basic Metabolic Panel: Recent Labs  Lab 12/05/20 0844 12/06/20 0834  NA 142 139  K 3.9 3.8  CL 106 106  CO2 29 27  GLUCOSE 174* 134*  BUN 29* 20  CREATININE 0.50 0.41*  CALCIUM 10.5* 10.5*   Liver Function Tests: Recent Labs  Lab 12/05/20 0844  AST 15  ALT 29  ALKPHOS 84  BILITOT 0.6  PROT 6.4*  ALBUMIN 2.6*    No results for input(s): LIPASE, AMYLASE in the last 168 hours. No results  for input(s): AMMONIA in the last 168 hours. CBC: Recent Labs  Lab 12/05/20 0844 12/06/20 0834  WBC 8.9 7.9  NEUTROABS 4.8  --   HGB 10.7* 11.0*  HCT 35.4* 35.6*  MCV 88.5 86.4  PLT 296 306   Cardiac Enzymes: No results for input(s): CKTOTAL, CKMB, CKMBINDEX, TROPONINI in the last 168 hours. BNP (last 3 results) Recent Labs    10/04/20 0051 10/05/20 0049 10/06/20 0548  BNP 145.8* 115.3* 125.4*    ProBNP (last 3 results) No results for input(s): PROBNP in the last 8760 hours.  CBG: Recent Labs  Lab 12/07/20 0727 12/07/20 1140 12/07/20 1948 12/07/20 2312 12/08/20 0335  GLUCAP 160* 88 146* 148* 175*    No results found for this or any previous visit (from the past 240 hour(s)).    Studies: DG CHEST PORT 1 VIEW  Result Date: 12/07/2020 CLINICAL DATA:  Respiratory distress. EXAM: PORTABLE CHEST 1 VIEW COMPARISON:  Chest x-ray dated November 25, 2020. FINDINGS: Unchanged tracheostomy tube. The heart size and mediastinal contours are within normal limits. No focal consolidation, pleural effusion, or pneumothorax. No acute osseous abnormality. IMPRESSION: No active disease. Electronically Signed   By: Titus Dubin M.D.   On: 12/07/2020 05:56    Scheduled Meds:  arformoterol  15 mcg Nebulization BID   baclofen  5 mg Per Tube TID   budesonide  0.5 mg Nebulization BID   chlorhexidine gluconate (MEDLINE KIT)  15 mL Mouth Rinse BID   Chlorhexidine Gluconate Cloth  6 each Topical Daily   clonazePAM  0.5 mg Per Tube BID   clopidogrel  75 mg Per Tube Daily   enoxaparin (LOVENOX) injection  0.5 mg/kg Subcutaneous Q24H   famotidine  20 mg Per Tube BID   feeding supplement (PROSource TF)  45 mL Per Tube BID   fiber  1 packet Per Tube BID   free water  200 mL Per Tube Q6H   Gerhardt's butt cream   Topical BID   guaiFENesin  5 mL Per Tube Q6H   hydrocortisone cream   Topical TID   insulin aspart  0-15 Units Subcutaneous Q4H   insulin glargine  40 Units Subcutaneous QHS    levETIRAcetam  2,500 mg Per Tube BID   loratadine  10 mg Per Tube Daily   mouth rinse  15 mL Mouth Rinse 10 times per day   montelukast  10 mg Per Tube Daily   nutrition supplement (JUVEN)  1 packet Per Tube BID   revefenacin  175 mcg Nebulization Daily   tiZANidine  2 mg Per Tube QHS   valproic acid  1,000 mg Per Tube Q12H   Continuous Infusions:  sodium chloride     feeding supplement (JEVITY 1.5 CAL/FIBER) 1,000 mL (12/07/20 4536)    Principal Problem:   Sepsis ([place]) Active Problems:   COPD (chronic obstructive pulmonary disease) (HCC)   Chronic diastolic CHF (congestive heart failure) (HCC)   Acute on chronic respiratory failure with hypoxia (HCC)   GERD (gastroesophageal reflux disease)   Seizures (HCC)   Type 2 diabetes mellitus without complication, with long-term current use of insulin (HCC)   Severe  hypoxic-ischemic encephalopathy   Tracheostomy dependence ([place])   Pressure injury of skin   Difficult airway   Ventilator dependence ([place])   Acquired tracheal collapse   Anoxic brain injury [facility])   Consultants: PCCM Palliative medicine Neurology  Procedures: Echocardiogram Replacement of trach  Antibiotics: Cefepime 6/12 through 6/14 Flagyl 6/12 through 6/14 Vancomycin 6/12 through 6/14 Cefepime x1 dose 6/18 Vancomycin x2 doses 6/18, 6/19   Time spent: 45 minutes    Erin Hearing ANP  [facility] 7 am - 330 pm/M-F for direct patient care and secure chat Please refer to Amion for contact info 68  days

## 2020-12-08 NOTE — Progress Notes (Signed)
EEG technician called multiple times with no answer to address the EEG's disconnection from local server per Tele-EEG tech monitoring the pt. Voicemail left.

## 2020-12-08 NOTE — Progress Notes (Signed)
LTM maintenance completed; EEG had gone local, moved to server; also fixed 10 leads.

## 2020-12-08 NOTE — Progress Notes (Signed)
EEG complete - results pending 

## 2020-12-09 DIAGNOSIS — Z430 Encounter for attention to tracheostomy: Secondary | ICD-10-CM | POA: Diagnosis not present

## 2020-12-09 DIAGNOSIS — Z931 Gastrostomy status: Secondary | ICD-10-CM | POA: Diagnosis not present

## 2020-12-09 DIAGNOSIS — R569 Unspecified convulsions: Secondary | ICD-10-CM | POA: Diagnosis not present

## 2020-12-09 LAB — GLUCOSE, CAPILLARY
Glucose-Capillary: 132 mg/dL — ABNORMAL HIGH (ref 70–99)
Glucose-Capillary: 133 mg/dL — ABNORMAL HIGH (ref 70–99)
Glucose-Capillary: 146 mg/dL — ABNORMAL HIGH (ref 70–99)
Glucose-Capillary: 146 mg/dL — ABNORMAL HIGH (ref 70–99)
Glucose-Capillary: 152 mg/dL — ABNORMAL HIGH (ref 70–99)
Glucose-Capillary: 180 mg/dL — ABNORMAL HIGH (ref 70–99)

## 2020-12-09 NOTE — Procedures (Addendum)
Patient Name: Sonia Drake  MRN: [id number]  Epilepsy Attending: Charlsie Quest  Referring Physician/Provider: Junious Silk, NP Duration: 12/08/2020 1108 to 12/09/2020 1108   Patient history:  65yo F with h/o anoxic brain injury now with seizure like episodes. EEG to evaluate for seizure   Level of alertness: awake, asleep   AEDs during EEG study: LEV, VPA   Technical aspects: This EEG study was done with scalp electrodes positioned according to the 10-20 International system of electrode placement. Electrical activity was acquired at a sampling rate of 500Hz  and reviewed with a high frequency filter of 70Hz  and a low frequency filter of 1Hz . EEG data were recorded continuously and digitally stored.    Description: No posterior dominant rhythm was seen. Sleep was characterized by sleep spindles (12-14Hz ), maximal frontocentral region. EEG showed continuous generalized 5 to 6 Hz theta as well as intermittent 2-3hz  delta slowing. There was also frontocentral 15-18Hz  beta activity. Generalized polyspikes were noted which at times appeared qasi-periodic at 0.25-1hz . Patient was noted to have episodes of rapid forceful eye twitching/blinking as well as at times was noted to have whole body brief jerking. These episodes were predominantly seen when patient was awake and occurred with varying frequency of few times an hour to once every few hours. Concomitant eeg showed generalized high amplitude polyspike consistent with myoclonic seizure. Hyperventilation and photic stimulation were not performed.      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 These episodes were predominantly seen when patient was awake  and occurred with varying frequency of few times an hour to once every few hours.every few hours. There is also moderate diffuse encephalopathy most likely due to anoxic/hypoxic brain injury.     [name] 

## 2020-12-09 NOTE — Progress Notes (Signed)
LTM maint complete - no skin breakdown  Maintenance T7 O1 O2 F7 P3 Pz Fz P4 P8 C3 [facility] monitored, Event button test confirmed by [facility].

## 2020-12-09 NOTE — Progress Notes (Signed)
PROGRESS NOTE    JASPER RUMINSKI  UUV:[id number] DOB: 1955-06-26 DOA: 10/05/2020 PCP: Garwin Brothers, MD    Chief Complaint  Patient presents with   Respiratory Distress    Brief Narrative:  65 y.o from SNF , Hx of DM2, CHF, h/o cardiac arrest in 11/2019 result in severe anoxic brain injury, s/p PEG tube, s/p chronic trach presents with acute on chronic respiratory failure.  Bronchoscopy performed on 6/16 showed dynamic airway collapse, tracheal collapse now vent-dependent,  Undergoing LTM to rule out seizure,  Continue goals of care Looking for snf placement  Subjective:  Undergoing LTM  On vent Vital signs are stable, unresponsive  Assessment & Plan:   Principal Problem:   Sepsis ([place]) Active Problems:   COPD (chronic obstructive pulmonary disease) (HCC)   Chronic diastolic CHF (congestive heart failure) (HCC)   Acute on chronic respiratory failure with hypoxia (HCC)   GERD (gastroesophageal reflux disease)   Seizures (HCC)   Type 2 diabetes mellitus without complication, with long-term current use of insulin (HCC)   Severe hypoxic-ischemic encephalopathy   Tracheostomy dependence (HCC)   Pressure injury of skin   Difficult airway   Ventilator dependence (HCC)   Acquired tracheal collapse   Anoxic brain injury ([place])   Acute on chronic hypoxic respiratory failure Likely due to dynamic airway collapse, also has advanced COPD ,now vent dependent There is a concern of aspiration pneumonia [name] /sepsis on presentation, ID consulted think infection is less likely with reassuring procalcitonin, positive blood culture likely contamination due to bedbound status , she was briefly on antibiotic for 4 days initially, antibiotic discontinued per ID recommendation   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 ,bedbound status, nonverbal at baseline Status post trach and PEG She is seen by neurology does not appear to have meaningful recovery   Seizure disorders,  with history of epilepsy prior to anoxic brain injury, worse since PEA arrest She is evaluated by neurology, was briefly on propofol drip Had several EEG study She is currently on clonazepam, Keppra, valproic acid Valproic acid level 56 on 7/20 Currently undergoing LTM , neurology following    Insulin-dependent type 2 diabetes A1c 7.0 Blood glucose Stable on current regimen      History of CVA Continue Plavix   Hypertension Discontinue metoprolol due to soft BP A.m. cortisol level unremarkable Consider midodrine if BP remains to be low Monitor   Chronic diastolic CHF, With preserved left ventricular EF She had  edema  from volume overload/third spacing  in july,  Today on 8/20 no significant edema on exam     . Sacral wound, present on admission  moisture related seen by wound care  Clean the sacrum with soap and water, rinse, pat dry. Place a piece of Xeroform gauze on the open wounds and secure with foam dressing. Change daily   Failure to thrive, poor prognosis, seen by palliative care, remain full code, palliative care signed off   Nutritional Assessment: obesity The patient's BMI is: Body mass index is 36.13 kg/m.Marland Kitchen Seen by dietician.  I agree with the assessment and plan as outlined below:  Nutrition Status: Nutrition Problem: Increased nutrient needs Etiology: wound healing Signs/Symptoms: estimated needs Interventions: Tube feeding        DVT prophylaxis:   Lovenox     Code Status: Full Family Communication: None at bedside Disposition:    Status is: Inpatient     Dispo: The patient is from: Skilled nursing facility  Anticipated d/c is to: To be determined              Anticipated d/c date is: TBD,  difficult placement                 Consultants:  Pulmonary critical care Infectious disease Palliative care Neurology   Procedures:  Bronchoscopy on 6/16, 6/23 EEG 6/25, 6/27, 6/28   Antimicrobials:   Anti-infectives (From  admission, onward)    Start     Dose/Rate Route Frequency Ordered Stop   10/07/20 1400  vancomycin (VANCOREADY) IVPB 1500 mg/300 mL  Status:  Discontinued        1,500 mg 150 mL/hr over 120 Minutes Intravenous Every 24 hours 10/07/20 1223 10/09/20 0829   10/07/20 1200  ceFEPIme (MAXIPIME) 2 g in sodium chloride 0.9 % 100 mL IVPB  Status:  Discontinued        2 g 200 mL/hr over 30 Minutes Intravenous Every 8 hours 10/07/20 0828 10/07/20 1221   10/02/20 1400  vancomycin (VANCOREADY) IVPB 1250 mg/250 mL  Status:  Discontinued        1,250 mg 166.7 mL/hr over 90 Minutes Intravenous Every 24 hours 10/15/2020 1313 10/04/20 1221   09/21/2020 2200  ceFEPIme (MAXIPIME) 2 g in sodium chloride 0.9 % 100 mL IVPB  Status:  Discontinued        2 g 200 mL/hr over 30 Minutes Intravenous Every 8 hours 09/27/2020 1313 10/04/20 1221   09/30/2020 2200  metroNIDAZOLE (FLAGYL) IVPB 500 mg  Status:  Discontinued        500 mg 100 mL/hr over 60 Minutes Intravenous Every 8 hours 09/26/2020 1616 10/04/20 1221   10/14/2020 1330  vancomycin (VANCOREADY) IVPB 2000 mg/400 mL        2,000 mg 200 mL/hr over 120 Minutes Intravenous  Once 10/18/2020 1257 10/15/2020 1550   09/21/2020 1300  ceFEPIme (MAXIPIME) 2 g in sodium chloride 0.9 % 100 mL IVPB        2 g 200 mL/hr over 30 Minutes Intravenous  Once 09/21/2020 1257 10/11/2020 1330   10/05/2020 1300  metroNIDAZOLE (FLAGYL) IVPB 500 mg        500 mg 100 mL/hr over 60 Minutes Intravenous  Once 10/16/2020 1257 10/19/2020 1443         Objective: Vitals:   12/09/20 1215 12/09/20 1230 12/09/20 1300 12/09/20 1400  BP:   91/69   Pulse: 78 77 79 83  Resp:      Temp:      TempSrc:      SpO2: 100% 100% 100% 100%  Weight:      Height:        Intake/Output Summary (Last 24 hours) at 12/09/2020 1456 Last data filed at 12/09/2020 0600 Gross per 24 hour  Intake 495 ml  Output 1225 ml  Net -730 ml   Filed Weights   12/07/20 0500 12/08/20 0500 12/09/20 0500  Weight: 87.2 kg 89.3 kg 89.6 kg     Examination:  General exam: unresponsive , nonverbal Respiratory system: Trach dependent , diminished at bases . Cardiovascular system: S1 & S2 heard, RRR. Marland Kitchen Gastrointestinal system: Positive PEG , abdomen is nondistended, soft and nontender.  Normal bowel sounds heard. Central nervous system: Unresponsive Extremities: No spontaneous movement Skin: Moisture related buttock wound Psychiatry: Vegetative state    Data Reviewed: I have personally reviewed following labs and imaging studies  CBC: Recent Labs  Lab 12/05/20 0844 12/06/20 0834  WBC 8.9 7.9  NEUTROABS 4.8  --  HGB 10.7* 11.0*  HCT 35.4* 35.6*  MCV 88.5 86.4  PLT 296 893    Basic Metabolic Panel: Recent Labs  Lab 12/05/20 0844 12/06/20 0834  NA 142 139  K 3.9 3.8  CL 106 106  CO2 29 27  GLUCOSE 174* 134*  BUN 29* 20  CREATININE 0.50 0.41*  CALCIUM 10.5* 10.5*    GFR: Estimated Creatinine Clearance: 72 mL/min (A) (by C-G formula based on SCr of 0.41 mg/dL (L)).  Liver Function Tests: Recent Labs  Lab 12/05/20 0844  AST 15  ALT 29  ALKPHOS 84  BILITOT 0.6  PROT 6.4*  ALBUMIN 2.6*    CBG: Recent Labs  Lab 27-Dec-2020 2042 Dec 27, 2020 2352 12/09/20 0319 12/09/20 0712 12/09/20 1107  GLUCAP 161* 142* 133* 146* 132*     No results found for this or any previous visit (from the past 240 hour(s)).       Radiology Studies: EEG adult  Result Date: 12-27-2020 Lora Havens, MD     December 27, 2020 11:16 AM Patient Name: MAKAILA WINDLE MRN: [id number] Epilepsy Attending: Lora Havens Referring Physician/Provider: Erin Hearing, NP Date: 12/27/2020 Duration: 26.07 mins Patient history:  65yo F with h/o anoxic brain injury now with seizure like episodes. EEG to evaluate for seizure Level of alertness: lethargic AEDs during EEG study: LEV, VPA Technical aspects: This EEG study was done with scalp electrodes positioned according to the 10-20 International system of electrode placement. Electrical  activity was acquired at a sampling rate of 500Hz  and reviewed with a high frequency filter of 70Hz  and a low frequency filter of 1Hz . EEG data were recorded continuously and digitally stored. Description: No posterior dominant rhythm was seen. EEG showed continuous generalized 5 to 6 Hz theta as well as intermittent 2-3hz  delta slowing. There was also frontocentral 15-18Hz  beta activity. Generalized polyspikes were noted which at times appeared qasi-periodic at 0.25-1hz . Hyperventilation and photic stimulation were not performed.   ABNORMALITY -Polyspikes, generalized - Continuous slow, generalized IMPRESSION: This study showed evidence of epileptogenicity with generalized onset. There is also moderate diffuse encephalopathy, nonspecific etiology but likely related to anoxic/hypoxic brain injury. No definite seizures were seen throughout the recording. Priyanka Barbra Sarks        Scheduled Meds:  arformoterol  15 mcg Nebulization BID   baclofen  5 mg Per Tube TID   budesonide  0.5 mg Nebulization BID   chlorhexidine gluconate (MEDLINE KIT)  15 mL Mouth Rinse BID   Chlorhexidine Gluconate Cloth  6 each Topical Daily   clonazePAM  0.5 mg Per Tube BID   clopidogrel  75 mg Per Tube Daily   enoxaparin (LOVENOX) injection  0.5 mg/kg Subcutaneous Q24H   famotidine  20 mg Per Tube BID   feeding supplement (PROSource TF)  45 mL Per Tube BID   fiber  1 packet Per Tube BID   free water  200 mL Per Tube Q6H   Gerhardt's butt cream   Topical BID   guaiFENesin  5 mL Per Tube Q6H   hydrocortisone cream   Topical TID   insulin aspart  0-15 Units Subcutaneous Q4H   insulin glargine  40 Units Subcutaneous QHS   levETIRAcetam  2,500 mg Per Tube BID   loratadine  10 mg Per Tube Daily   mouth rinse  15 mL Mouth Rinse 10 times per day   montelukast  10 mg Per Tube Daily   nutrition supplement (JUVEN)  1 packet Per Tube BID   revefenacin  175  mcg Nebulization Daily   tiZANidine  2 mg Per Tube QHS   valproic  acid  1,000 mg Per Tube Q12H   Continuous Infusions:  sodium chloride     feeding supplement (JEVITY 1.5 CAL/FIBER) 1,000 mL (12/09/20 0905)     LOS: 69 days   Time spent: 33mns Greater than 50% of this time was spent in counseling, explanation of diagnosis, planning of further management, and coordination of care.   Voice Recognition /Viviann Sparedictation system was used to create this note, attempts have been made to correct errors. Please contact the author with questions and/or clarifications.   FFlorencia Reasons MD PhD FACP [facility]  Available via Epic secure chat 7am-7pm for nonurgent issues Please page for urgent issues To page the attending provider between 7A-7P or the covering provider during after hours 7P-7A, please log into the web site [email or web address] and access using universal [place] password for that web site. If you do not have the password, please call the hospital operator.    12/09/2020, 2:56 PM

## 2020-12-10 DIAGNOSIS — R569 Unspecified convulsions: Secondary | ICD-10-CM | POA: Diagnosis not present

## 2020-12-10 DIAGNOSIS — Z430 Encounter for attention to tracheostomy: Secondary | ICD-10-CM | POA: Diagnosis not present

## 2020-12-10 DIAGNOSIS — J398 Other specified diseases of upper respiratory tract: Secondary | ICD-10-CM | POA: Diagnosis not present

## 2020-12-10 DIAGNOSIS — A419 Sepsis, unspecified organism: Secondary | ICD-10-CM | POA: Diagnosis not present

## 2020-12-10 DIAGNOSIS — Z931 Gastrostomy status: Secondary | ICD-10-CM | POA: Diagnosis not present

## 2020-12-10 LAB — GLUCOSE, CAPILLARY
Glucose-Capillary: 109 mg/dL — ABNORMAL HIGH (ref 70–99)
Glucose-Capillary: 119 mg/dL — ABNORMAL HIGH (ref 70–99)
Glucose-Capillary: 130 mg/dL — ABNORMAL HIGH (ref 70–99)
Glucose-Capillary: 141 mg/dL — ABNORMAL HIGH (ref 70–99)
Glucose-Capillary: 172 mg/dL — ABNORMAL HIGH (ref 70–99)
Glucose-Capillary: 204 mg/dL — ABNORMAL HIGH (ref 70–99)

## 2020-12-10 NOTE — Procedures (Addendum)
Patient Name: Sonia Drake  MRN: [id number]  Epilepsy Attending: Charlsie Quest  Referring Physician/Provider: Junious Silk, NP Duration: 12/09/2020 1108 to 12/10/2020 1108   Patient history:  65yo F with h/o anoxic brain injury now with seizure like episodes. EEG to evaluate for seizure   Level of alertness: awake, asleep   AEDs during EEG study: LEV, VPA   Technical aspects: This EEG study was done with scalp electrodes positioned according to the 10-20 International system of electrode placement. Electrical activity was acquired at a sampling rate of 500Hz  and reviewed with a high frequency filter of 70Hz  and a low frequency filter of 1Hz . EEG data were recorded continuously and digitally stored.    Description: No posterior dominant rhythm was seen. Sleep was characterized by sleep spindles (12-14Hz ), maximal frontocentral region. EEG showed continuous generalized 5 to 6 Hz theta as well as intermittent 2-3hz  delta slowing. There was also frontocentral 15-18Hz  beta activity. Generalized polyspikes were noted which at times appeared qasi-periodic at 0.25-1hz , when awake/stimulated. Patient was noted to have episodes of rapid forceful eye twitching/blinking as well as at times was noted to have whole body brief jerking. These episodes were predominantly seen when patient was awake and occurred with varying frequency of few times an hour to once every few hours. Concomitant eeg showed generalized high amplitude polyspike consistent with myoclonic seizure. Hyperventilation and photic stimulation were not performed.      ABNORMALITY - Myoclonic seizure, generalized  -Polyspikes, generalized - Continuous slow, generalized   IMPRESSION: This study showed myoclonic seizures with generalized onset During the seizures, patient was noted to have episodes of rapid forceful eye twitching/blinking as well as at times was noted to have whole body brief jerking. These episodes were predominantly seen  when patient was awake and occurred with varying frequency of few times an hour to once every few hours.every few hours. There is also moderate diffuse encephalopathy most likely due to anoxic/hypoxic brain injury.    Tiani Stanbery 

## 2020-12-10 NOTE — Procedures (Signed)
Patient Name: Sonia Drake  MRN: [id number]  Epilepsy Attending: Charlsie Quest  Referring Physician/Provider: Junious Silk, NP Duration: 12/10/2020 1108 to 12/10/2020 1426   Patient history:  65yo F with h/o anoxic brain injury now with seizure like episodes. EEG to evaluate for seizure   Level of alertness: awake, asleep   AEDs during EEG study: LEV, VPA   Technical aspects: This EEG study was done with scalp electrodes positioned according to the 10-20 International system of electrode placement. Electrical activity was acquired at a sampling rate of 500Hz  and reviewed with a high frequency filter of 70Hz  and a low frequency filter of 1Hz . EEG data were recorded continuously and digitally stored.    Description: No posterior dominant rhythm was seen. Sleep was characterized by sleep spindles (12-14Hz ), maximal frontocentral region. EEG showed continuous generalized 5 to 6 Hz theta as well as intermittent 2-3hz  delta slowing. There was also frontocentral 15-18Hz  beta activity. Generalized polyspikes were noted which at times appeared qasi-periodic at 0.25-1hz , when awake/stimulated. Patient was noted to have episodes of rapid forceful eye twitching/blinking as well as at times was noted to have whole body brief jerking. These episodes were predominantly seen when patient was awake and occurred with varying frequency of few times an hour to once every few hours. Concomitant eeg showed generalized high amplitude polyspike consistent with myoclonic seizure. Hyperventilation and photic stimulation were not performed.      ABNORMALITY - Myoclonic seizure, generalized  -Polyspikes, generalized - Continuous slow, generalized   IMPRESSION: This study showed myoclonic seizures with generalized onset During the seizures, patient was noted to have episodes of rapid forceful eye twitching/blinking as well as at times was noted to have whole body brief jerking. These episodes were predominantly seen  when patient was awake and occurred with varying frequency of few times an hour to once every few hours.every few hours. There is also moderate diffuse encephalopathy most likely due to anoxic/hypoxic brain injury.     Yosiah Jasmin 

## 2020-12-10 NOTE — Progress Notes (Signed)
NAME:  Sonia Drake, MRN:  [id number], DOB:  03-Sep-1955, LOS: 70 ADMISSION DATE:  October 22, 2020, CONSULTATION DATE:  6/15 REFERRING MD:  Thedore Mins, CHIEF COMPLAINT:  Dyspnea   History of Present Illness:  65 y/o female with anoxic brain injury, tracheostomy status who was admitted from a SNF on 6/12 with worsening respiratory failure.  She had previously been ventilator dependent but had been weaned from the trach.  On 6/16 a bronchoscopy revealed dynamic collapse of her trachea, tracheostomy was replaced.  Later required mechanical ventilation.  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 No distress. WBC ct trending down. No events overnight growing GPC clusters in two of two cultures  6/16 increased work of breathing, upper airway noise, abdominal muscle use.  Bronchoscopy performed as trach exchange cuffless #8 Shiley XLT showed dynamic airway collapse, tracheal collapse. 6/16 continued respiratory distress moved to ICU, changed to cuffed #6 Shiley XLT, placed on MV with some improved comfort 6/12 cefepime > 6/14, 6/18 6/12 vanc > 6/14, 6/18 6/23 mucus plugging, severe vent dyssynchrony 6/24-25 intermittent jerking, seizures on EEG 6/27 weaned off propofol gtt 6/28 briefly tolerated trach collar in AM 6/29 tolerated trach collar in AM; no further seizure like activity; working with CM/SW for vent-SNF 7/1 Weaning on CPAP/ PS 10/5, CM SW working on [employer], NO seizure activity, secretions are not an issue per  nursing 7/11 trach changed to #8 Bivona with air cuff, much improved. 7/12 TCT 12 hours; rested on Vent overnight 7/13 on TCT 7/23 Trach collar tolerated 7/24 Full vent support 7/26>> Remains on full vent support this am, no issues with secretions per nursing 8/5 occluding trach with dynamic collapse: seen on bronchoscopy 8/6 called back emergently to bedside for tracheostomy occlusion: emergent bronch, bivona trach migrated back 2cm, improved with advancing to 2cm above carina 8/8 collapse caused hypoxia. Improved with repositioning  8/9 afternoon had another episode of tracheal collapse and occlusion of trach requiring BVM, sedation. Family produced paperwork which seems to have conflicting information about pts wishes  8/11: pt continues to have multiple daily episodes of tracheal collapse/mucous plugging req lavage, BVM. Repeated discussions with family re: code status esp in light of papwerwork  Interim History / Subjective:  Had episode of respiratory distress overnight improved with advancing trach collar.  Ethics consult noted.  Objective   Blood pressure (!) 82/65, pulse 80, temperature 97.8 F (36.6 C), temperature source Axillary, resp. rate 12, height 5\' 2"  (1.575 m), weight 88.9 kg, SpO2 98 %.    Vent Mode: PRVC FiO2 (%):  [30 %-40 %] 30 % Set Rate:  [16 bmp] 16 bmp Vt Set:  [400 mL] 400 mL PEEP:  [5 cmH20] 5 cmH20 Plateau Pressure:  [19 cmH20-25 cmH20] 25 cmH20   Intake/Output Summary (Last 24 hours) at 12/10/2020 1018 Last data filed at 12/10/2020 0700 Gross per 24 hour  Intake 900 ml  Output 1200 ml  Net -300 ml   Filed Weights   12/09/20 0500 12/09/20 2341 12/10/20 0426  Weight: 89.6 kg 88.9 kg 88.9 kg   Physical Exam: General: Chronically ill-appearing, no acute distress, unresponsive  HENT: EEG leads in place [place], AT, OP clear, MMM Neck: Trach in place, c/d/i Respiratory: Coarse breath sounds bilaterally, no distress Cardiovascular: RRR, -M/R/G, no  JVD Extremities:-Edema,-tenderness Neuro: Unresponsive, cough, pupillary, corneal reflexes  Resolved Hospital Problem list     Assessment & Plan:   Anoxic encephalopathy / anoxic brain injury Acute on chronic respiratory failure w hypercarbia and hypoxia  Severe tracheobronchomalacia Trach status Hx COPD  -Recurrent episodes of desats with hyperdynamic collapse of airway in spite of increasing PEEP and repositioning trach -etiology of trach occlusion, dynamic airway collapse. Associated severe dyssynchrony  Plan: Vent/trach dependent -  Trial PS as tolerated. Discussed plan with RT Full vent support Pending placement with SNF  Continue bivona trach  Ongoing GOC  Pulmonary will continue to intermittently follow   The patient is respiratory failure and requirement mechanical ventilation and titration. Independent Critical Care Time: 35 Minutes.   Mechele Collin, M.D. [facility] Pulmonary/Critical Care Medicine 12/10/2020 10:29 AM   Please see Amion for pager number to reach on-call Pulmonary and Critical Care Team.

## 2020-12-10 NOTE — Progress Notes (Signed)
PROGRESS NOTE    Sonia Drake  HGD:[id number] DOB: 07-Aug-1955 DOA: 10/12/2020 PCP: Garwin Brothers, MD    Chief Complaint  Patient presents with   Respiratory Distress    Brief Narrative:  65 y.o from SNF , Hx of DM2, CHF, h/o cardiac arrest in 11/2019 result in severe anoxic brain injury, s/p PEG tube, s/p chronic trach presents with acute on chronic respiratory failure.  Bronchoscopy performed on 6/16 showed dynamic airway collapse, tracheal collapse now vent-dependent,  Undergoing LTM to rule out seizure,  Continue goals of care Looking for snf placement  Subjective:  Off LTM  On vent Vital signs are stable, open eyes spontaneously ,she does not track with her eyes,   Assessment & Plan:   Principal Problem:   Sepsis ([place]) Active Problems:   COPD (chronic obstructive pulmonary disease) (HCC)   Chronic diastolic CHF (congestive heart failure) (HCC)   Acute on chronic respiratory failure with hypoxia (HCC)   GERD (gastroesophageal reflux disease)   Seizures (HCC)   Type 2 diabetes mellitus without complication, with long-term current use of insulin (HCC)   Severe hypoxic-ischemic encephalopathy   Tracheostomy dependence (HCC)   Pressure injury of skin   Difficult airway   Ventilator dependence (HCC)   Acquired tracheal collapse   Anoxic brain injury ([place])   Acute on chronic hypoxic respiratory failure Likely due to dynamic airway collapse, also has advanced COPD ,now vent dependent There was a concern of aspiration pneumonia [name] /sepsis on presentation, ID consulted think infection is less likely with reassuring procalcitonin, positive blood culture likely contamination due to bedbound status , she was briefly on antibiotic for 4 days initially, antibiotic discontinued per ID recommendation    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 Hortense Ramal today on 8/21 , need to continue goals of care discussion as more antiseizure treatment will not improve outcome  neurology Dr Hortense Ramal will see patient tomorrow,       Insulin-dependent type 2 diabetes A1c 7.0 Blood glucose Stable on current regimen      History of CVA Continue Plavix   Hypertension Discontinue metoprolol due to soft BP A.m. cortisol level unremarkable Consider midodrine if BP remains to be low Monitor   Chronic diastolic CHF, With preserved left ventricular EF She had  edema  from volume overload/third spacing  in july,  Today on 8/21 no significant edema on exam     . Sacral wound, present on admission  moisture related seen by wound care  Clean the sacrum with soap and water, rinse, pat dry. Place a piece of Xeroform gauze on the open wounds and secure with foam dressing. Change daily   Failure to thrive, poor prognosis, seen by palliative care, remain full code, palliative care signed off   Nutritional Assessment: obesity The patient's BMI is: Body mass index is 35.85 kg/m.Marland Kitchen Seen by dietician.  I agree with the assessment and plan as outlined below:  Nutrition Status: Nutrition Problem: Increased nutrient needs Etiology: wound healing Signs/Symptoms: estimated needs Interventions: Tube feeding        DVT prophylaxis:  Lovenox     Code Status: Full Family Communication: None at bedside Disposition:    Status is: Inpatient     Dispo: The patient is from: Skilled nursing facility              Anticipated d/c is to: To be determined              Anticipated d/c date is: TBD,  difficult placement                  Consultants:  Pulmonary critical care Infectious disease Palliative care Neurology   Procedures:  Bronchoscopy on 6/16, 6/23 EEG 6/25, 6/27, 6/28 LTM on 8/19-8/20   Antimicrobials:   Anti-infectives (From admission, onward)    Start     Dose/Rate Route Frequency Ordered Stop   10/07/20 1400  vancomycin (VANCOREADY) IVPB 1500 mg/300 mL  Status:  Discontinued        1,500 mg 150 mL/hr over 120 Minutes Intravenous Every 24 hours 10/07/20 1223 10/09/20 0829   10/07/20 1200  ceFEPIme (MAXIPIME) 2 g in sodium chloride 0.9 % 100 mL IVPB  Status:  Discontinued        2 g 200 mL/hr over 30 Minutes Intravenous Every 8 hours 10/07/20 0828 10/07/20 1221   10/02/20 1400  vancomycin (VANCOREADY) IVPB 1250 mg/250 mL  Status:  Discontinued        1,250 mg 166.7 mL/hr over 90 Minutes Intravenous Every 24 hours 09/26/2020 1313 10/04/20 1221   10/08/2020 2200  ceFEPIme (MAXIPIME) 2 g in sodium chloride 0.9 % 100 mL IVPB  Status:  Discontinued        2 g 200 mL/hr over 30 Minutes Intravenous Every 8 hours 10/09/2020 1313 10/04/20 1221   10/14/2020 2200  metroNIDAZOLE (FLAGYL) IVPB 500 mg  Status:  Discontinued        500 mg 100 mL/hr over 60 Minutes Intravenous Every 8 hours 09/28/2020 1616 10/04/20 1221   10/19/2020 1330  vancomycin (VANCOREADY) IVPB 2000 mg/400 mL        2,000 mg 200 mL/hr over 120 Minutes Intravenous  Once 10/07/2020 1257 09/20/2020 1550   10/05/2020 1300  ceFEPIme (MAXIPIME) 2 g in sodium chloride 0.9 % 100 mL IVPB        2 g 200 mL/hr over 30 Minutes Intravenous  Once 09/25/2020 1257 09/26/2020 1330   09/30/2020 1300  metroNIDAZOLE (FLAGYL) IVPB 500 mg        500 mg 100 mL/hr over 60 Minutes Intravenous  Once 10/08/2020 1257 10/13/2020 1443         Objective: Vitals:   12/10/20 1430 12/10/20 1445 12/10/20 1500 12/10/20 1524  BP: (!) 161/101 (!) 137/96 (!) 134/96 (!) 147/97  Pulse: 96 96 96   Resp: (!) 23 (!) 23 20   Temp:      TempSrc:      SpO2: 100% 100% 100% 100%  Weight:       Height:        Intake/Output Summary (Last 24 hours) at 12/10/2020 1702 Last data filed at 12/10/2020 0700 Gross per 24 hour  Intake 585 ml  Output 1200 ml  Net -615 ml   Filed Weights   12/09/20 0500 12/09/20 2341 12/10/20 0426  Weight: 89.6 kg 88.9 kg 88.9 kg    Examination:  General exam: unresponsive , nonverbal, open eyes spontaneously, no purposeful eye tracking Respiratory system: Trach dependent , diminished at bases . Cardiovascular system: S1 & S2 heard, RRR. Marland Kitchen Gastrointestinal system: Positive PEG ,  abdomen is nondistended, soft and nontender.  Normal bowel sounds heard. Central nervous system: Unresponsive Extremities: No spontaneous movement Skin: Moisture related buttock wound Psychiatry: Vegetative state    Data Reviewed: I have personally reviewed following labs and imaging studies  CBC: Recent Labs  Lab 12/05/20 0844 12/06/20 0834  WBC 8.9 7.9  NEUTROABS 4.8  --   HGB 10.7* 11.0*  HCT 35.4* 35.6*  MCV 88.5 86.4  PLT 296 694    Basic Metabolic Panel: Recent Labs  Lab 12/05/20 0844 12/06/20 0834  NA 142 139  K 3.9 3.8  CL 106 106  CO2 29 27  GLUCOSE 174* 134*  BUN 29* 20  CREATININE 0.50 0.41*  CALCIUM 10.5* 10.5*    GFR: Estimated Creatinine Clearance: 71.6 mL/min (A) (by C-G formula based on SCr of 0.41 mg/dL (L)).  Liver Function Tests: Recent Labs  Lab 12/05/20 0844  AST 15  ALT 29  ALKPHOS 84  BILITOT 0.6  PROT 6.4*  ALBUMIN 2.6*    CBG: Recent Labs  Lab 12/09/20 2339 12/10/20 0405 12/10/20 0812 12/10/20 1220 12/10/20 1514  GLUCAP 146* 130* 204* 109* 119*     No results found for this or any previous visit (from the past 240 hour(s)).       Radiology Studies: No results found.      Scheduled Meds:  arformoterol  15 mcg Nebulization BID   baclofen  5 mg Per Tube TID   budesonide  0.5 mg Nebulization BID   chlorhexidine gluconate (MEDLINE KIT)  15 mL Mouth Rinse BID   Chlorhexidine Gluconate Cloth   6 each Topical Daily   clonazePAM  0.5 mg Per Tube BID   clopidogrel  75 mg Per Tube Daily   enoxaparin (LOVENOX) injection  0.5 mg/kg Subcutaneous Q24H   famotidine  20 mg Per Tube BID   feeding supplement (PROSource TF)  45 mL Per Tube BID   fiber  1 packet Per Tube BID   free water  200 mL Per Tube Q6H   Gerhardt's butt cream   Topical BID   guaiFENesin  5 mL Per Tube Q6H   hydrocortisone cream   Topical TID   insulin aspart  0-15 Units Subcutaneous Q4H   insulin glargine  40 Units Subcutaneous QHS   levETIRAcetam  2,500 mg Per Tube BID   loratadine  10 mg Per Tube Daily   mouth rinse  15 mL Mouth Rinse 10 times per day   montelukast  10 mg Per Tube Daily   nutrition supplement (JUVEN)  1 packet Per Tube BID   revefenacin  175 mcg Nebulization Daily   tiZANidine  2 mg Per Tube QHS   valproic acid  1,000 mg Per Tube Q12H   Continuous Infusions:  sodium chloride     feeding supplement (JEVITY 1.5 CAL/FIBER) 1,000 mL (12/09/20 0905)     LOS: 70 days   Time spent: 64mns Greater than 50% of this time was spent in counseling, explanation of diagnosis, planning of further management, and coordination of care.   Voice Recognition /Viviann Sparedictation system was used to create this note, attempts have been made to correct errors. Please contact the author with questions and/or clarifications.   FFlorencia Reasons MD PhD FACP [facility]  Available via Epic secure chat 7am-7pm for nonurgent issues Please page for urgent issues To page the attending provider between 7A-7P or the covering provider during after hours 7P-7A, please log into the web site [email or web address] and access using universal  [place] password for that web site. If you do not have the password, please call the hospital operator.    12/10/2020, 5:02 PM

## 2020-12-10 NOTE — Progress Notes (Signed)
[facility] notified. No skin breakdown noted.  

## 2020-12-11 DIAGNOSIS — J9621 Acute and chronic respiratory failure with hypoxia: Secondary | ICD-10-CM | POA: Diagnosis not present

## 2020-12-11 DIAGNOSIS — G931 Anoxic brain damage, not elsewhere classified: Secondary | ICD-10-CM | POA: Diagnosis not present

## 2020-12-11 DIAGNOSIS — R569 Unspecified convulsions: Secondary | ICD-10-CM | POA: Diagnosis not present

## 2020-12-11 DIAGNOSIS — Z930 Tracheostomy status: Secondary | ICD-10-CM | POA: Diagnosis not present

## 2020-12-11 LAB — GLUCOSE, CAPILLARY
Glucose-Capillary: 137 mg/dL — ABNORMAL HIGH (ref 70–99)
Glucose-Capillary: 131 mg/dL — ABNORMAL HIGH (ref 70–99)
Glucose-Capillary: 132 mg/dL — ABNORMAL HIGH (ref 70–99)
Glucose-Capillary: 108 mg/dL — ABNORMAL HIGH (ref 70–99)
Glucose-Capillary: 156 mg/dL — ABNORMAL HIGH (ref 70–99)
Glucose-Capillary: 160 mg/dL — ABNORMAL HIGH (ref 70–99)
Glucose-Capillary: 147 mg/dL — ABNORMAL HIGH (ref 70–99)

## 2020-12-11 MED ORDER — ORAL CARE MOUTH RINSE
15.0000 mL | Freq: Four times a day (QID) | OROMUCOSAL | Status: DC
  Administered 2020-12-11 – 2020-12-20 (×33): 15 mL via OROMUCOSAL

## 2020-12-11 MED ORDER — CLONAZEPAM 1 MG PO TABS
1.0000 mg | ORAL_TABLET | Freq: Two times a day (BID) | ORAL | Status: DC
  Administered 2020-12-11 – 2021-01-27 (×94): 1 mg
  Filled 2020-12-11 (×94): qty 1

## 2020-12-11 NOTE — Consult Note (Signed)
Subjective: EEG was ordered because patient was noted to have frequent blinking concerning for seizures.  Multiple seizures were recorded during which patient was noted to have episodes of rapid forceful eye twitching/blinking as well as full body brief jerking consistent with myoclonic seizures.  ROS: Unable to obtain due to poor mental status  Examination  Vital signs in last 24 hours: Temp:  [97.6 F (36.4 C)-98.4 F (36.9 C)] 98 F (36.7 C) (08/22 0716) Pulse Rate:  [71-118] 83 (08/22 1100) Resp:  [11-24] 16 (08/22 1100) BP: (83-182)/(57-135) 133/95 (08/22 1100) SpO2:  [97 %-100 %] 100 % (08/22 1100) FiO2 (%):  [30 %] 30 % (08/22 0828) Weight:  [87.9 kg] 87.9 kg (08/22 0400)  General: lying in bed, not in apparent distress CVS: pulse-normal rate and rhythm RS: Tracheostomy in place, coarse breath sounds bilaterally Extremities: warm Neuro: opens eyes to repeated noxious stimuli, does not follow commands, PERRLA, left gaze preference, corneal reflex intact, cough reflex intact, withdraws to noxious stimuli in all 4 extremities, spastic in all extremities  Basic Metabolic Panel: Recent Labs  Lab 12/05/20 0844 12/06/20 0834  NA 142 139  K 3.9 3.8  CL 106 106  CO2 29 27  GLUCOSE 174* 134*  BUN 29* 20  CREATININE 0.50 0.41*  CALCIUM 10.5* 10.5*    CBC: Recent Labs  Lab 12/05/20 0844 12/06/20 0834  WBC 8.9 7.9  NEUTROABS 4.8  --   HGB 10.7* 11.0*  HCT 35.4* 35.6*  MCV 88.5 86.4  PLT 296 306     Coagulation Studies: No results for input(s): LABPROT, INR in the last 72 hours.  Imaging MRI brain without contrast 10/09/2020: No acute intracranial abnormality. Advanced atrophy for age and sequelae of chronic small vessel disease.  ASSESSMENT AND PLAN: 65 year old female with prior history of PEA status post ROSC, anoxic brain injury, in persistent vegetative state (opens eyes but does not follow commands at baseline) who is noted to have intermittent twitching and  myoclonic jerks.    Myoclonic seizures Anoxic encephalopathy (present on admission) -LTM EEG showed  myoclonic seizures with generalized onset During the seizures, patient was noted to have episodes of rapid forceful eye twitching/blinking as well as at times was noted to have whole body brief jerking. These episodes were predominantly seen when patient was awake and occurred with varying frequency of few times an hour to once every few hours.every few hours. There is also moderate diffuse encephalopathy most likely due to anoxic/hypoxic brain injury.   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  I would favor not aggressively treating them with antiepileptic medications as this would likely lead to sedation -Continue seizure precautions -Of note, per review of chart, her family understands patient's severe neurologic injury and would like to continue current scope of care -Management of rest of comorbidities per primary team   Thank you for allowing [name] to participate in the care of this patient.  Neurology will follow peripherally.  Please call us for any further questions.   I have spent a total of 35  minutes with the patient reviewing hospital notes,  test results, labs and examining the patient as well as establishing an assessment and plan.  > 50% of time was spent in direct patient care.   Lindie Spruce Epilepsy [facility] For questions after 5pm please refer to AMION to reach the Neurologist on  call

## 2020-12-11 NOTE — Progress Notes (Addendum)
[facility] PROGRESS NOTE  Sonia Drake CNO:[id number] DOB: 11/02/1955 DOA: 09/28/2020 PCP: Garwin Brothers, MD  Status: Remains inpatient appropriate because:Altered mental status, Unsafe d/c plan, and Inpatient level of care appropriate due to severity of illness  Dispo: The patient is from: SNF              Anticipated d/c is to: SN[facility] [place] [facility] [place] but due to the fact she has an adjustable trach and given her recurrent airway issues as described above they have declined to take her this particular facility.                Patient currently is not medically stable to d/c.   Difficult to place patient Yes   Level of care: ICU  Code Status: Full-pulmonary medicine documents paperwork that presents some conflicting information about the patient's wishes Family Communication: Spoke with patient's daughter in detail on 8/17.  Discussed CODE STATUS as well as futility of care.  Daughter not yet ready to change mother to DNR or advance to comfort measures.  8/22 updated daughter regarding recurrence of status epilepticus and explained that this was a poor prognostic indicator.  Daughter attempted to attach cousin to the call but cousin did not answer phone. DVT prophylaxis: Lovenox COVID vaccination status: Per H&P patient is not vaccinated  **8/22 extensive conversations held with both patient's daughter Sonia Drake as well as patient's niece Sonia Drake.  Please see separate documentation regarding these conversations**  The following pertains to patient's living will dated June 22nd, 2016: 1) no sedative medications to keep her asleep.  She wants to be fully awake regardless of pain 2) she requested all means to keep her alive.  She wants to remain on life support until her daughter, Sonia Drake and the grandchildren decide otherwise after consulting with the medical team 3) she also hand wrote that she wishes to be allowed 74 to 90 days to see if she  can pull through  HPI: 65 y/o female with medical history significant of asthma/COPD, CHF, cardiac arrest with PEA/CPR with chronic respiratory failure on permanent trach with supplemental oxygen at 6L, type 2 DM, GERD, seizures and severe hypoxic-ischemic encephalopathy who presented to ER via EMS for respiratory distress from SNF.  She presented with findings consistent with acute aspiration pneumonitis.  She decompensated after arrival and required transfer to ICU and initiation of mechanical ventilation.  She had sepsis physiology presumed secondary to [facility] bacteremia but it was later determined that the blood culture results were related to contaminants and patient was no longer determined to be septic   She had previously been ventilator dependent but had been weaned from the ventilator briefly during her stay. On 6/16 a bronchoscopy revealed dynamic collapse of her trachea, tracheostomy was changed from 8.0 XLT cuffless to 6.0 XLT cuffed. She now is requiring chronic ventilatory support 2/2 advanced COPD/asthma and tracheobronchomalacia. PCCM physicians updated patient's daughter on status and poor prognosis along with recommendation for chronic vent.  Palliative medicine has had multiple discussions with the daughter.  Daughter continues to request aggressive care.  Unfortunately she continues to have multiple daily events regarding tracheal collapse with mucous plugging requiring excessive lavage and suction as well as bag-valve-mask for ventilation  Significant Hospital events: 6/12 admitted from skilled nursing facility after aspiration event and copious secretions per tracheostomy 6/13 appears comfortable currently on 35% ATC. No distress. WBC ct trending down. No events overnight growing GPC clusters in two of two  cultures  6/16 increased work of breathing, upper airway noise, abdominal muscle use.  Bronchoscopy performed as trach exchange cuffless #8 Shiley XLT showed dynamic airway  collapse, tracheal collapse. 6/16 continued respiratory distress moved to ICU, changed to cuffed #6 Shiley XLT, placed on MV with some improved comfort 6/12 cefepime > 6/14, 6/18 6/12 vanc > 6/14, 6/18 6/23 mucus plugging, severe vent dyssynchrony 6/24-25 intermittent jerking, seizures on EEG 6/27 weaned off propofol gtt 6/28 briefly tolerated trach collar in AM 6/29 tolerated trach collar in AM; no further seizure like activity; working with CM/SW for vent-SNF 7/1 Weaning on CPAP/ PS 10/5, CM SW working on [employer], NO seizure activity, secretions are not an issue per nursing 7/11 trach changed to #8 Bivona with air cuff, much improved. 7/12 TCT 12 hours; rested on Vent overnight 7/13 on TCT 7/23 Trach collar tolerated 7/24 Full vent support 7/26>> Remains on full vent support this am, no issues with secretions per nursing 8/5 occluding trach with dynamic collapse: seen on bronchoscopy 8/6 called back emergently to bedside for tracheostomy occlusion: emergent bronch, bivona trach migrated back 2cm, improved with advancing to 2cm above carina 8/8 collapse caused hypoxia. Improved with repositioning  8/9 afternoon had another episode of tracheal collapse and occlusion of trach requiring BVM, sedation. Family produced paperwork which seems to have conflicting information about pts wishes  8/11: pt continues to have multiple daily episodes of tracheal collapse/mucous plugging req lavage, BVM. Repeated discussions with family re: code status esp in light of paperwork  8/15 consultation placed with ethics committee to evaluate regarding futility of care 8/17 spoke at length with patient's daughter regarding futility of care CODE STATUS.  Daughter continues to desire aggressive measures.  Subjective: Eyes closed and remains unresponsive to stimulation  Objective: Vitals:   12/11/20 0700 12/11/20 0716  BP: (!) 137/96   Pulse: 94   Resp: 19   Temp:  98 F (36.7 C)  SpO2: 100%      Intake/Output Summary (Last 24 hours) at 12/11/2020 0740 Last data filed at 12/11/2020 0700 Gross per 24 hour  Intake 3185 ml  Output 1450 ml  Net 1735 ml   Filed Weights   12/09/20 2341 12/10/20 0426 12/11/20 0400  Weight: 88.9 kg 88.9 kg 87.9 kg    Exam:  Constitutional: Appears to be in no acute physical distress and remains unresponsive Respiratory: 8.0 cuffed Bivana air trach, ventilator settings: PSV mode, PEEP 5, PS 15, FiO2 30%; does have spontaneous respiratory effort.  Lungs remain clear and coarse. Cardiovascular: Pulse regular with underlying sinus rhythm sinus tach, stable peripheral edema, normotensive. Abdomen: Nondistended with normoactive bowel sounds. PEG tube for feedings. LBM 8/21 Skin: Stage II decubitus of sacrum-not examined today Neurologic: Remains unresponsive.  No response to painful stimulus when applied. Psychiatric: Eyes closed.  Unresponsive and unable to assess accurately.  Assessment/Plan: Acute problems: Acute on chronic respiratory failure w/ hypoxemia Tracheostomy/vent dependence 2/2 Tracheobronchomalcia and advanced COPD/asthma Not a candidate to wean from ventilator or decannulation Continue chest PT and bronchodilators, tracheal suction as needed PCCM physician spoke at length with daughter about chronicity of these medical problems and that patient will always be ventilator dependent.  Daughter opted to continue current aggressive care.  I also spoke at length with daughter on 8/17 and she wishes to continue aggressive care  Has spontaneous respiratory effort given underlying dyssynchrony plan is to trial PSV with PS 15 and PEEP 5-stable O2 requirement  Known anoxic brain injury after cardiac arrest  Patient without any meaningful improvement.  Her eyes are open without blink reflex and does not track around the room.  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 Continue low-dose baclofen and Zanaflex See below regarding continuous seizure activity. Prognosis extremely poor and at this juncture it appears we are approaching futility of care.  Palliative medicine has reached out to the family on multiple occasions but they continue to wish for aggressive care. Appreciate assistance of ethics team regarding futility of care concerns  Seizure disorder In review of outpatient notes this was present prior to her cardiac arrest Continue Keppra, clonazepam (increased to 1 mg BID ON 8/22) and Depakote-Had episodic issues related to status epilepticus requiring formal neurological consultation Patient with repetitive blinking--EEG with run of polyspikes on EEG. likely due to h/o anoxic injury. leaving for ltm per Dr Hortense Ramal LTM obtained oon 8/19-20 showed continued seizure spikes, attending physician over the weekend spoke with Dr. Hortense Ramal on 8/21.-Of note, patient has been in persistent vegetative state and I do not believe that treating the seizures will lead to any improvement in patient's outcome.  I would favor not aggressively treating them with antiepileptic medications as this would likely lead to sedation  History of cardiac arrest Review of outpatient cardiology documents from 09/08/2019 revealed no evidence of CAD Patient was treated for cardiac arrest in August 2021 at [facility].  According to EMS records during that admission patient called 911 in context of respiratory failure.  She was gasping for air and then became unresponsive.  Upon EMS arrival patient was pulseless and apneic.  History of CVA Continue Plavix   Hyponatremia Resolved after holding free water current sodium 139  Diabetes mellitus 2 on long-term insulin with  hyperglycemia Continue Lantus and SSI Hemoglobin A1c 7.0 Current CBGs well-controlled   Hypertension Blood pressure times has been suboptimal therefore beta-blocker discontinued. As of 8/16 patient noted to be in a 4 L negative balance so was given 1 L of IV fluid Resumed on free water which had been held secondary to hyponatremia-current sodium is 142-follow closely and if sodium begins to drop can use alternative fluids for volume replacement   Chronic diastolic dysfunction Echocardiogram this admission with grade 1 diastolic dysfunction, moderate LV hypertrophy hyperdynamic LV systolic function Currently euvolemic Monitor I/O Continue as needed beta-blocker for elevated heart rate SBP remains suboptimal Not on ACE or ARB secondary to suboptimal blood pressure reading   Chronic protein malnutrition secondary to sequelae from anoxic brain injury/PEG tube dependence Continue current tube feedings with Prosource Nutrition Problem: Increased nutrient needs Etiology: wound healing Signs/Symptoms: estimated needs Interventions: Tube feeding Body mass index is 35.44 kg/m.   Anemia of chronic disease Hemoglobin remained stable around 10  Stage II sacral decubitus Wound / Incision (Open or Dehisced) 10/31/20 (MASD) Moisture Associated Skin Damage Buttocks Bilateral (Active)  Date First Assessed/Time First Assessed: 10/31/20 2100   Wound Type: (MASD) Moisture Associated Skin Damage  Location: Buttocks  Location Orientation: Bilateral  Present on Admission: No    Assessments 10/31/2020  9:20 PM 12/10/2020  8:00 PM  Dressing Type None;Other (Comment) Foam - Lift dressing to assess site every shift  Dressing Status -- Clean;Intact;Dry  Dressing Change Frequency -- PRN  Site / Wound Assessment Pink  Clean;Dry;Red  Drainage Amount -- None  Treatment Cleansed Cleansed;Other (Comment)     No Linked orders to display       Data Reviewed: Basic Metabolic Panel: Recent Labs  Lab  12/05/20 0844 12/06/20 0834  NA 142 139  K 3.9 3.8  CL 106 106  CO2 29 27  GLUCOSE 174* 134*  BUN 29* 20  CREATININE 0.50 0.41*  CALCIUM 10.5* 10.5*   Liver Function Tests: Recent Labs  Lab 12/05/20 0844  AST 15  ALT 29  ALKPHOS 84  BILITOT 0.6  PROT 6.4*  ALBUMIN 2.6*    No results for input(s): LIPASE, AMYLASE in the last 168 hours. No results for input(s): AMMONIA in the last 168 hours. CBC: Recent Labs  Lab 12/05/20 0844 12/06/20 0834  WBC 8.9 7.9  NEUTROABS 4.8  --   HGB 10.7* 11.0*  HCT 35.4* 35.6*  MCV 88.5 86.4  PLT 296 306   Cardiac Enzymes: No results for input(s): CKTOTAL, CKMB, CKMBINDEX, TROPONINI in the last 168 hours. BNP (last 3 results) Recent Labs    10/04/20 0051 10/05/20 0049 10/06/20 0548  BNP 145.8* 115.3* 125.4*    ProBNP (last 3 results) No results for input(s): PROBNP in the last 8760 hours.  CBG: Recent Labs  Lab 12/10/20 1514 12/10/20 1910 12/10/20 2350 12/11/20 0319 12/11/20 0715  GLUCAP 119* 172* 141* 137* 131*    No results found for this or any previous visit (from the past 240 hour(s)).    Studies: No results found.  Scheduled Meds:  arformoterol  15 mcg Nebulization BID   baclofen  5 mg Per Tube TID   budesonide  0.5 mg Nebulization BID   chlorhexidine gluconate (MEDLINE KIT)  15 mL Mouth Rinse BID   Chlorhexidine Gluconate Cloth  6 each Topical Daily   clonazePAM  0.5 mg Per Tube BID   clopidogrel  75 mg Per Tube Daily   enoxaparin (LOVENOX) injection  0.5 mg/kg Subcutaneous Q24H   famotidine  20 mg Per Tube BID   feeding supplement (PROSource TF)  45 mL Per Tube BID   fiber  1 packet Per Tube BID   free water  200 mL Per Tube Q6H   Gerhardt's butt cream   Topical BID   guaiFENesin  5 mL Per Tube Q6H   hydrocortisone cream   Topical TID   insulin aspart  0-15 Units Subcutaneous Q4H   insulin glargine  40 Units Subcutaneous QHS   levETIRAcetam  2,500 mg Per Tube BID   loratadine  10 mg Per Tube  Daily   mouth rinse  15 mL Mouth Rinse 10 times per day   montelukast  10 mg Per Tube Daily   nutrition supplement (JUVEN)  1 packet Per Tube BID   revefenacin  175 mcg Nebulization Daily   tiZANidine  2 mg Per Tube QHS   valproic acid  1,000 mg Per Tube Q12H   Continuous Infusions:  sodium chloride     feeding supplement (JEVITY 1.5 CAL/FIBER) 1,000 mL (12/09/20 0905)    Principal Problem:   Sepsis ([place]) Active Problems:   COPD (chronic obstructive pulmonary disease) (HCC)   Chronic diastolic CHF (congestive heart failure) (HCC)   Acute on chronic respiratory failure with hypoxia (HCC)   GERD (gastroesophageal reflux disease)   Seizures (HCC)   Type 2 diabetes mellitus without complication, with long-term current use of insulin (HCC)   Severe hypoxic-ischemic encephalopathy   Tracheostomy dependence (HCC)   Pressure injury of skin  Difficult airway   Ventilator dependence ([place])   Acquired tracheal collapse   Anoxic brain injury [facility])   Consultants: PCCM Palliative medicine Neurology  Procedures: Echocardiogram Replacement of trach  Antibiotics: Cefepime 6/12 through 6/14 Flagyl 6/12 through 6/14 Vancomycin 6/12 through 6/14 Cefepime x1 dose 6/18 Vancomycin x2 doses 6/18, 6/19   Time spent: 45 minutes    Erin Hearing ANP  [facility] 7 am - 330 pm/M-F for direct patient care and secure chat Please refer to Amion for contact info 71  days

## 2020-12-11 NOTE — Plan of Care (Signed)
  Problem: Education: Goal: Knowledge of General Education information will improve Description: Including pain rating scale, medication(s)/side effects and non-pharmacologic comfort measures Outcome: Progressing   Problem: Health Behavior/Discharge Planning: Goal: Ability to manage health-related needs will improve Outcome: Progressing   Problem: Clinical Measurements: Goal: Ability to maintain clinical measurements within normal limits will improve Outcome: Progressing Goal: Will remain free from infection Outcome: Progressing Goal: Diagnostic test results will improve Outcome: Progressing Goal: Respiratory complications will improve Outcome: Progressing Goal: Cardiovascular complication will be avoided Outcome: Progressing   Problem: Activity: Goal: Risk for activity intolerance will decrease Outcome: Progressing   Problem: Nutrition: Goal: Adequate nutrition will be maintained Outcome: Progressing   Problem: Elimination: Goal: Will not experience complications related to bowel motility Outcome: Progressing Goal: Will not experience complications related to urinary retention Outcome: Progressing   Problem: Pain Managment: Goal: General experience of comfort will improve Outcome: Progressing   Problem: Safety: Goal: Ability to remain free from injury will improve Outcome: Progressing   Problem: Skin Integrity: Goal: Risk for impaired skin integrity will decrease Outcome: Progressing   Problem: Activity: Goal: Ability to tolerate increased activity will improve Outcome: Progressing   Problem: Respiratory: Goal: Ability to maintain a clear airway and adequate ventilation will improve Outcome: Progressing   Problem: Education: Goal: Knowledge about tracheostomy care/management will improve Outcome: Progressing   Problem: Activity: Goal: Ability to tolerate increased activity will improve Outcome: Progressing   Problem: Health Behavior/Discharge  Planning: Goal: Ability to manage tracheostomy will improve Outcome: Progressing   Problem: Respiratory: Goal: Patent airway maintenance will improve Outcome: Progressing   Problem: Role Relationship: Goal: Ability to communicate will improve Outcome: Progressing

## 2020-12-11 NOTE — Progress Notes (Addendum)
Extensive conversations held both with patient's daughter [place] (20 min) and patient's niece [name] (45 min)  Both were updated on patient's worsening neurological status in context of reemergence of constant seizure activity detected on long-term EEG.  Patient is on maximal medication and the neurologist has documented that further aggressive treatment of seizures would not change/improve the outcome.  In addition aggressively treating seizures with antiepileptic medications would lead to sedation.  More detailed discussions held with [name].  Initially she focuses on patient's history at time of initial brain injury stating that they were repeatedly told that the patient 1) would not survive the first 48 hours, 2) she would never get off the ventilator. Patient clinically was able to accomplish these things.  Unfortunately while at the nursing facility patient became hypoxic O2 levels in the 80% range possibly from aspiration.  Patient decompensated to the point of requiring a ventilator and has been ventilator dependent since that time.  During our discussion [name] verbalizes understanding of clinical status including the worsening neurological status as evidenced by constant seizure activity as well as issues regarding tracheal and bronchial collapse requiring frequent suctioning and use of BMV to improve O2 saturations.  She reports that in 2016 her aunt had filed 1 living will and was told that the hospital could not locate it so on a admission after that she apparently completed another living will.  Jodene Nam states that for the most part prior to the brain injury that she was the family member that primarily held end-of-life discussions with the patient.  During these discussions patient was adamant about not being made a DNR and being treated as aggressively as possible.  Since admission a living will signed and notarized on October 12, 2014 list the patient's following requests: 1) no sedative  medications to keep her asleep, she wants to be fully awake regardless of pain 2) patient documented in her own handwriting that she will need all means to keep her self alive.  She wanted to remain on life support until [place] and the grandchildren decide otherwise after consultation with the medical team. 3) she also wrote she be allowed to 30 to 90 days to see if she can pull through.  I did clarify with Jodene Nam that the 30 to 90-day timeframe begins at date of current admission on October 12, 2020.  [name] verbalizes understanding that it probably would not be a good idea to resuscitate her aunt and if a decision unable to be made by the family to change to DNR status then she prefers that God take the patient in the event of an arrest situation and allows her to comfortably transition to death.  According to Jodene Nam there may be another living will document notarized since the October 12, 2014 document as listed above.  Of note Jodene Nam thanked me for allowing her time to speak freely and listen to her concerns.

## 2020-12-12 DIAGNOSIS — J9621 Acute and chronic respiratory failure with hypoxia: Secondary | ICD-10-CM | POA: Diagnosis not present

## 2020-12-12 DIAGNOSIS — I5032 Chronic diastolic (congestive) heart failure: Secondary | ICD-10-CM | POA: Diagnosis not present

## 2020-12-12 DIAGNOSIS — J398 Other specified diseases of upper respiratory tract: Secondary | ICD-10-CM | POA: Diagnosis not present

## 2020-12-12 DIAGNOSIS — G931 Anoxic brain damage, not elsewhere classified: Secondary | ICD-10-CM | POA: Diagnosis not present

## 2020-12-12 DIAGNOSIS — A419 Sepsis, unspecified organism: Secondary | ICD-10-CM | POA: Diagnosis not present

## 2020-12-12 LAB — GLUCOSE, CAPILLARY
Glucose-Capillary: 137 mg/dL — ABNORMAL HIGH (ref 70–99)
Glucose-Capillary: 110 mg/dL — ABNORMAL HIGH (ref 70–99)
Glucose-Capillary: 153 mg/dL — ABNORMAL HIGH (ref 70–99)
Glucose-Capillary: 144 mg/dL — ABNORMAL HIGH (ref 70–99)
Glucose-Capillary: 151 mg/dL — ABNORMAL HIGH (ref 70–99)
Glucose-Capillary: 163 mg/dL — ABNORMAL HIGH (ref 70–99)

## 2020-12-12 MED ORDER — VALPROIC ACID 250 MG/5ML PO SOLN
250.0000 mg | Freq: Once | ORAL | Status: AC
  Filled 2020-12-12: qty 5

## 2020-12-12 NOTE — Plan of Care (Signed)
Patient is non interactive post an anoxic brain injury 1 year ago.

## 2020-12-12 NOTE — Progress Notes (Signed)
[facility] PROGRESS NOTE  Deann J Bouler MRN:[id number] DOB: 11/12/1955 DOA: 09/23/2020 PCP: Amin, Saad, MD  Status: Remains inpatient appropriate because:Altered mental status, Unsafe d/c plan, and Inpatient level of care appropriate due to severity of illness  Dispo: The patient is from: SNF              Anticipated d/c is to: SNF Ventilator capable[facility] and [facility] [place] but due to the fact she has an adjustable trach and given her recurrent airway issues as described above they have declined to take her this particular facility.                Patient currently is not medically stable to d/c.   Difficult to place patient Yes   Level of care: ICU  Code Status: Full-pulmonary medicine documents paperwork that presents some conflicting information about the patient's wishes Family Communication: Spoke with patient's daughter in detail on 8/17.  Discussed CODE STATUS as well as futility of care.  Daughter not yet ready to change mother to DNR or advance to comfort measures.  8/22 updated daughter regarding recurrence of status epilepticus and explained that this was a poor prognostic indicator.  Daughter attempted to attach cousin to the call but cousin did not answer phone. DVT prophylaxis: Lovenox COVID vaccination status: Per H&P patient is not vaccinated  **8/22 extensive conversations held with both patient's daughter [name] as well as patient's niece [name].  Please see separate documentation regarding these conversations**  The following pertains to patient's living will dated June 22nd, 2016: 1) no sedative medications to keep her asleep.  She wants to be fully awake regardless of pain 2) she requested all means to keep her alive.  She wants to remain on life support until her daughter, [name] and the grandchildren decide otherwise after consulting with the medical team 3) she also hand wrote that she wishes to be allowed 30 to 90 days to see if she  can pull through  HPI: 65 y/o female with medical history significant of asthma/COPD, CHF, cardiac arrest with PEA/CPR with chronic respiratory failure on permanent trach with supplemental oxygen at 6L, type 2 DM, GERD, seizures and severe hypoxic-ischemic encephalopathy who presented to ER via EMS for respiratory distress from SNF.  She presented with findings consistent with acute aspiration pneumonitis.  She decompensated after arrival and required transfer to ICU and initiation of mechanical ventilation.  She had sepsis physiology presumed secondary to GPC bacteremia but it was later determined that the blood culture results were related to contaminants and patient was no longer determined to be septic   She had previously been ventilator dependent but had been weaned from the ventilator briefly during her stay. On 6/16 a bronchoscopy revealed dynamic collapse of her trachea, tracheostomy was changed from 8.0 XLT cuffless to 6.0 XLT cuffed. She now is requiring chronic ventilatory support 2/2 advanced COPD/asthma and tracheobronchomalacia. PCCM physicians updated patient's daughter on status and poor prognosis along with recommendation for chronic vent.  Palliative medicine has had multiple discussions with the daughter.  Daughter continues to request aggressive care.  Unfortunately she continues to have multiple daily events regarding tracheal collapse with mucous plugging requiring excessive lavage and suction as well as bag-valve-mask for ventilation  Patient has had reemergence of continuous seizure activity.  Initially detected by noting patient with rhythmic blinking.  Continuous EEG revealed ongoing seizure activity.  On 8/23 during exam with sternal rub patient had what appeared to be decerebrate posturing,   eyes were opened and noted with sluggish response to pupils.  Not long after eyes were opened for examination patient began having forceful, rhythmic spasms of the bilateral face that  appeared consistent with seizure activity.  I returned to her room 1 hour later and her eyes were open and she was blinking constantly.  She had no blink reflex when tested.  She had another facial twitch that appeared consistent with seizure activity.  RN at bedside and witnessed these findings.  Significant Hospital events: 6/12 admitted from skilled nursing facility after aspiration event and copious secretions per tracheostomy 6/13 appears comfortable currently on 35% ATC. No distress. WBC ct trending down. No events overnight growing GPC clusters in two of two cultures  6/16 increased work of breathing, upper airway noise, abdominal muscle use.  Bronchoscopy performed as trach exchange cuffless #8 Shiley XLT showed dynamic airway collapse, tracheal collapse. 6/16 continued respiratory distress moved to ICU, changed to cuffed #6 Shiley XLT, placed on MV with some improved comfort 6/12 cefepime > 6/14, 6/18 6/12 vanc > 6/14, 6/18 6/23 mucus plugging, severe vent dyssynchrony 6/24-25 intermittent jerking, seizures on EEG 6/27 weaned off propofol gtt 6/28 briefly tolerated trach collar in AM 6/29 tolerated trach collar in AM; no further seizure like activity; working with CM/SW for vent-SNF 7/1 Weaning on CPAP/ PS 10/5, CM SW working on Vent SNF, NO seizure activity, secretions are not an issue per nursing 7/11 trach changed to #8 Bivona with air cuff, much improved. 7/12 TCT 12 hours; rested on Vent overnight 7/13 on TCT 7/23 Trach collar tolerated 7/24 Full vent support 7/26>> Remains on full vent support this am, no issues with secretions per nursing 8/5 occluding trach with dynamic collapse: seen on bronchoscopy 8/6 called back emergently to bedside for tracheostomy occlusion: emergent bronch, bivona trach migrated back 2cm, improved with advancing to 2cm above carina 8/8 collapse caused hypoxia. Improved with repositioning  8/9 afternoon had another episode of tracheal collapse and  occlusion of trach requiring BVM, sedation. Family produced paperwork which seems to have conflicting information about pts wishes  8/11: pt continues to have multiple daily episodes of tracheal collapse/mucous plugging req lavage, BVM. Repeated discussions with family re: code status esp in light of paperwork  8/15 consultation placed with ethics committee to evaluate regarding futility of care 8/17 spoke at length with patient's daughter regarding futility of care CODE STATUS.  Daughter continues to desire aggressive measures.  Subjective: Unresponsive.  Patient opens eyes spontaneously but there is no blink reflex.  No response to pain other than decerebrate posturing with sternal rub.  Objective: Vitals:   12/12/20 0500 12/12/20 0600  BP: (!) 104/40 (!) 98/55  Pulse: 84 74  Resp: 16 15  Temp:    SpO2: 99% 99%    Intake/Output Summary (Last 24 hours) at 12/12/2020 0808 Last data filed at 12/12/2020 0500 Gross per 24 hour  Intake 1690 ml  Output 875 ml  Net 815 ml   Filed Weights   12/09/20 2341 12/10/20 0426 12/11/20 0400  Weight: 88.9 kg 88.9 kg 87.9 kg    Exam:  Constitutional: Unresponsive, no respiratory distress detected Respiratory: 8.0 cuffed Bivana air trach, ventilator settings: PRVC mode,VT 400, Rate 16, PEEP 5, PS 15, FiO2 30%; does have spontaneous respiratory effort.  Lungs remain clear and coarse. Cardiovascular: Pulse regular with underlying sinus rhythm sinus tach, stable peripheral edema, normotensive. Abdomen: Nondistended with normoactive bowel sounds. PEG tube for feedings. LBM 8/21 Skin: Stage II decubitus of sacrum-not   examined today Neurologic: Remains unresponsive.  No response to painful stimulus when applied. Psychiatric: Eyes closed.  Unresponsive and unable to assess accurately.  Assessment/Plan: Acute problems: Acute on chronic respiratory failure w/ hypoxemia Tracheostomy/vent dependence 2/2 Tracheobronchomalcia and advanced COPD/asthma Not  a candidate to wean from ventilator or decannulation Continue chest PT and bronchodilators, tracheal suction as needed PCCM physician spoke at length with daughter about chronicity of these medical problems and that patient will always be ventilator dependent.  Daughter opted to continue current aggressive care.  I also spoke at length with daughter on 8/17 and she wishes to continue aggressive care  Has spontaneous respiratory effort but did not tolerate PSV setting back in [facility] mode (full ventilatory support) Breath stacking noted overnight so had to receive a dose of Ativan  Known anoxic brain injury after cardiac arrest  Patient without any meaningful improvement.  Her eyes are open without blink reflex and does not track around the room.  No response to painful stimuli.  Continues to have rhythmic blinking consistent with seizure activity and also various degrees of facial spasms that are rhythmic in nature and also related to seizure activity.  Today (8/23 ) noted with decerebrate posturing with sternal rub without any concomitant response to painful stimulus applied to extremities. 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 Prognosis extremely poor and at this juncture it appears we are approaching futility of care.  Palliative medicine has reached out to the family on multiple occasions but they continue to wish for aggressive care. Appreciate assistance of ethics team regarding futility of care concerns  Seizure disorder In review of outpatient notes this was present prior to her cardiac arrest Continue Keppra, clonazepam (increased to 1 mg BID on 8/22) and Depakote -Episodic issues related to status epilepticus requiring formal neurological consultation Patient with  repetitive blinking--EEG with run of polyspikes on EEG. likely due to h/o anoxic injury. (per Dr Hortense Ramal) LTM obtained oon 8/19-20 showed continued seizure spikes, attending physician over the weekend spoke with Dr. Hortense Ramal on 8/21 -Per neurologist: "Of note, patient has been in persistent vegetative state and I do not believe that treating the seizures will lead to any improvement in patient's outcome.  I would favor not aggressively treating them with antiepileptic medications as this would likely lead to sedation"  History of cardiac arrest Review of outpatient cardiology documents from 09/08/2019 revealed no evidence of CAD Patient was treated for cardiac arrest 2/2 acute hypoxia in August 2021 at [facility].  According to EMS records during that admission patient called 911 in context of respiratory failure.  She was gasping for air and then became unresponsive.  Upon EMS arrival patient was pulseless and apneic.  History of CVA Continue Plavix   Hyponatremia Resolved after holding free water current sodium 139  Diabetes mellitus 2 on long-term insulin with hyperglycemia Continue Lantus and SSI Hemoglobin A1c 7.0 Current CBGs well-controlled   Hypertension Blood pressure times has been suboptimal therefore beta-blocker discontinued.   Chronic diastolic dysfunction Echocardiogram this admission with grade 1 diastolic dysfunction, moderate LV hypertrophy hyperdynamic LV systolic function Currently euvolemic Monitor I/O Continue as needed beta-blocker for elevated heart rate SBP remains suboptimal Not on ACE or ARB secondary to suboptimal blood pressure reading   Chronic protein malnutrition secondary to sequelae from anoxic brain injury/PEG tube dependence Continue  current tube feedings with Prosource Nutrition Problem: Increased nutrient needs Etiology: wound healing Signs/Symptoms: estimated needs Interventions: Tube feeding Body mass index is 35.44 kg/m.   Anemia  of chronic disease Hemoglobin remained stable around 10  Stage II sacral decubitus Wound / Incision (Open or Dehisced) 10/31/20 (MASD) Moisture Associated Skin Damage Buttocks Bilateral (Active)  Date First Assessed/Time First Assessed: 10/31/20 2100   Wound Type: (MASD) Moisture Associated Skin Damage  Location: Buttocks  Location Orientation: Bilateral  Present on Admission: No    Assessments 10/31/2020  9:20 PM 12/11/2020 11:30 PM  Dressing Type None;Other (Comment) Foam - Lift dressing to assess site every shift  Dressing Changed -- Changed  Dressing Status -- Clean;Dry;Intact  Dressing Change Frequency -- PRN  Site / Wound Assessment Pink Clean;Dry;Pink;Red  Treatment Cleansed Cleansed;Other (Comment)     No Linked orders to display       Data Reviewed: Basic Metabolic Panel: Recent Labs  Lab 12/05/20 0844 12/06/20 0834  NA 142 139  K 3.9 3.8  CL 106 106  CO2 29 27  GLUCOSE 174* 134*  BUN 29* 20  CREATININE 0.50 0.41*  CALCIUM 10.5* 10.5*   Liver Function Tests: Recent Labs  Lab 12/05/20 0844  AST 15  ALT 29  ALKPHOS 84  BILITOT 0.6  PROT 6.4*  ALBUMIN 2.6*    No results for input(s): LIPASE, AMYLASE in the last 168 hours. No results for input(s): AMMONIA in the last 168 hours. CBC: Recent Labs  Lab 12/05/20 0844 12/06/20 0834  WBC 8.9 7.9  NEUTROABS 4.8  --   HGB 10.7* 11.0*  HCT 35.4* 35.6*  MCV 88.5 86.4  PLT 296 306   Cardiac Enzymes: No results for input(s): CKTOTAL, CKMB, CKMBINDEX, TROPONINI in the last 168 hours. BNP (last 3 results) Recent Labs    10/04/20 0051 10/05/20 0049 10/06/20 0548  BNP 145.8* 115.3* 125.4*    ProBNP (last 3 results) No results for input(s): PROBNP in the last 8760 hours.  CBG: Recent Labs  Lab 12/11/20 2017 12/11/20 2150 12/11/20 2321 12/12/20 0322 12/12/20 0728  GLUCAP 156* 160* 147* 137* 110*    No results found for this or any previous visit (from the past 240 hour(s)).    Studies: No  results found.  Scheduled Meds:  arformoterol  15 mcg Nebulization BID   baclofen  5 mg Per Tube TID   budesonide  0.5 mg Nebulization BID   chlorhexidine gluconate (MEDLINE KIT)  15 mL Mouth Rinse BID   Chlorhexidine Gluconate Cloth  6 each Topical Daily   clonazePAM  1 mg Per Tube BID   clopidogrel  75 mg Per Tube Daily   enoxaparin (LOVENOX) injection  0.5 mg/kg Subcutaneous Q24H   famotidine  20 mg Per Tube BID   feeding supplement (PROSource TF)  45 mL Per Tube BID   fiber  1 packet Per Tube BID   free water  200 mL Per Tube Q6H   Gerhardt's butt cream   Topical BID   guaiFENesin  5 mL Per Tube Q6H   hydrocortisone cream   Topical TID   insulin aspart  0-15 Units Subcutaneous Q4H   insulin glargine  40 Units Subcutaneous QHS   levETIRAcetam  2,500 mg Per Tube BID   loratadine  10 mg Per Tube Daily   mouth rinse  15 mL Mouth Rinse QID   montelukast  10 mg Per Tube Daily   nutrition supplement (JUVEN)  1 packet Per Tube BID     revefenacin  175 mcg Nebulization Daily   tiZANidine  2 mg Per Tube QHS   valproic acid  1,000 mg Per Tube Q12H   Continuous Infusions:  sodium chloride     feeding supplement (JEVITY 1.5 CAL/FIBER) 1,000 mL (12/09/20 0905)    Principal Problem:   Sepsis ([place]) Active Problems:   COPD (chronic obstructive pulmonary disease) (HCC)   Chronic diastolic CHF (congestive heart failure) (HCC)   Acute on chronic respiratory failure with hypoxia (HCC)   GERD (gastroesophageal reflux disease)   Seizures (HCC)   Type 2 diabetes mellitus without complication, with long-term current use of insulin (HCC)   Severe hypoxic-ischemic encephalopathy   Tracheostomy dependence ([place])   Pressure injury of skin   Difficult airway   Ventilator dependence ([place])   Acquired tracheal collapse   Anoxic brain injury [facility])   Consultants: PCCM Palliative medicine Neurology  Procedures: Echocardiogram Replacement of trach  Antibiotics: Cefepime 6/12 through  6/14 Flagyl 6/12 through 6/14 Vancomycin 6/12 through 6/14 Cefepime x1 dose 6/18 Vancomycin x2 doses 6/18, 6/19   Time spent: 45 minutes    Erin Hearing ANP  [facility] 7 am - 330 pm/M-F for direct patient care and secure chat Please refer to Amion for contact info 72  days

## 2020-12-12 NOTE — Progress Notes (Signed)
Nutrition Follow-up  DOCUMENTATION CODES:   Not applicable  INTERVENTION:   Continue tube feeds via PEG: - Jevity 1.5 @ 45 ml/hr (1080 ml/day) - ProSource TF 45 ml BID - Free water flushes of 200 ml q 6 hours  Tube feeding regimen provides 1700 kcal, 91 grams of protein, and 821 ml of H2O.  Total free water with flushes: 1621 ml  - Continue Juven BID via PEG, each packet provides 80 calories, 8 grams of carbohydrate, 2.5  grams of protein, 7 grams of L-arginine and 7 grams of L-glutamine; supplement contains CaHMB, vitamins C, E, B12 and zinc to promote wound healing  NUTRITION DIAGNOSIS:   Increased nutrient needs related to wound healing as evidenced by estimated needs.  Ongoing, being addressed via TF  GOAL:   Patient will meet greater than or equal to 90% of their needs  Met via TF  MONITOR:   Vent status, Labs, Weight trends, TF tolerance, Skin, I & O's  REASON FOR ASSESSMENT:   Consult Enteral/tube feeding initiation and management  ASSESSMENT:   Sonia Drake is a 65 y.o. female with medical history significant of asthma/COPD, CHF, cardiac arrest with PEA/CPR with chronic respiratory failure on permanent trach with supplemental oxygen at 6L, type 2 DM, GERD, seizures and severe hypoxic-ischemic encephalopathy who presented to ER via EMS for respiratory distress from SNF.  Discussed pt with RN and during ICU rounds. Ethics Committee has been consulted. Pt tolerating current TF without issue.  Current TF: Jevity 1.5 @ 45 ml/hr, ProSource TF 45 ml BID, free water flushes 200 ml q 6 hours  Admit weight: 88.4 kg Current weight: 87.9 kg  Pt with mild pitting generalized edema.  Patient remains on ventilator support via trach MV: 10.0 L/min Temp (24hrs), Avg:98.5 F (36.9 C), Min:98.2 F (36.8 C), Max:98.7 F (37.1 C)  Medications reviewed and include: pepcid, nutrisource fiber, SSI q 4 hours, lantus 40 units daily, Juven BID  Labs reviewed. CBG's:  108-160 x 24 hours  UOP: 950 ml x 24 hours  Diet Order:   Diet Order             Diet NPO time specified  Diet effective now                   EDUCATION NEEDS:   No education needs have been identified at this time  Skin:  Skin Assessment: Skin Integrity Issues: Other: MASD to buttocks  Last BM:  12/12/20 type 5  Height:   Ht Readings from Last 1 Encounters:  10/12/20 5' 2" (1.575 m)    Weight:   Wt Readings from Last 1 Encounters:  12/11/20 87.9 kg    Ideal Body Weight:  45.5 kg  BMI:  Body mass index is 35.44 kg/m.  Estimated Nutritional Needs:   Kcal:  1600-1800  Protein:  90-105 grams  Fluid:  > 1.6 L    [name], MS, RD, LDN Inpatient Clinical Dietitian Please see AMiON for contact information.  

## 2020-12-12 NOTE — Progress Notes (Signed)
NAME:  Sonia Drake, MRN:  [id number], DOB:  1955/08/06, LOS: 72 ADMISSION DATE:  10-22-2020, CONSULTATION DATE:  6/15 REFERRING MD:  Thedore Mins, CHIEF COMPLAINT:  Dyspnea   History of Present Illness:  65 y/o female with anoxic brain injury, tracheostomy status who was admitted from a SNF on 6/12 with worsening respiratory failure.  She had previously been ventilator dependent but had been weaned from the trach.  On 6/16 a bronchoscopy revealed dynamic collapse of her trachea, tracheostomy was replaced.  Later required mechanical ventilation.  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 No distress. WBC ct trending down. No events overnight growing GPC clusters in two of two cultures  6/16 increased work of breathing, upper airway noise, abdominal muscle use.  Bronchoscopy performed as trach exchange cuffless #8 Shiley XLT showed dynamic airway collapse, tracheal collapse. 6/16 continued respiratory distress moved to ICU, changed to cuffed #6 Shiley XLT, placed on MV with some improved comfort 6/12 cefepime > 6/14, 6/18 6/12 vanc > 6/14, 6/18 6/23 mucus plugging, severe vent dyssynchrony 6/24-25 intermittent jerking, seizures on EEG 6/27 weaned off propofol gtt 6/28 briefly tolerated trach collar in AM 6/29 tolerated trach collar in AM; no further seizure like activity; working with CM/SW for vent-SNF 7/1 Weaning on CPAP/ PS 10/5, CM SW working on [employer], NO seizure activity, secretions are not an issue per  nursing 7/11 trach changed to #8 Bivona with air cuff, much improved. 7/12 TCT 12 hours; rested on Vent overnight 7/13 on TCT 7/23 Trach collar tolerated 7/24 Full vent support 7/26>> Remains on full vent support this am, no issues with secretions per nursing 8/5 occluding trach with dynamic collapse: seen on bronchoscopy 8/6 called back emergently to bedside for tracheostomy occlusion: emergent bronch, bivona trach migrated back 2cm, improved with advancing to 2cm above carina 8/8 collapse caused hypoxia. Improved with repositioning  8/9 afternoon had another episode of tracheal collapse and occlusion of trach requiring BVM, sedation. Family produced paperwork which seems to have conflicting information about pts wishes  8/11: pt continues to have multiple daily episodes of tracheal collapse/mucous plugging req lavage, BVM. 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 Neuro consulted  Interim History / Subjective:   No more episodes for resp distress. Mucus plugging Noted to have myoclonic seizures yesterday. Neuro consulted  Objective   Blood pressure (!) 98/55, pulse 89, temperature 98.6 F (37 C), temperature source Oral, resp. rate (!) 26, height 5\' 2"  (1.575 m), weight 87.9 kg, SpO2 100 %.    Vent Mode: PRVC FiO2 (%):  [30 %] 30 % Set Rate:  [16 bmp] 16 bmp Vt Set:  [400 mL] 400 mL PEEP:  [5 cmH20] 5 cmH20 Plateau Pressure:  [15 cmH20-25 cmH20] 17 cmH20   Intake/Output Summary (Last 24 hours) at 12/12/2020 0851 Last data filed at 12/12/2020 0500 Gross per 24 hour  Intake 1690 ml  Output 875 ml  Net 815 ml   Filed Weights   12/09/20 2341 12/10/20 0426 12/11/20 0400  Weight: 88.9 kg 88.9  kg 87.9 kg   Physical Exam: Gen:      No acute distress, chronically ill appearing HEENT:  EOMI, sclera anicteric Neck:     No masses; no thyromegaly, ETT Lungs:    Clear to auscultation  bilaterally; normal respiratory effort CV:         Regular rate and rhythm; no murmurs Abd:      + bowel sounds; soft, non-tender; no palpable masses, no distension Ext:    No edema; adequate peripheral perfusion Skin:      Warm and dry; no rash Neuro: Unresponsive, comatose  Resolved Hospital Problem list     Assessment & Plan:   Anoxic encephalopathy / anoxic brain injury Acute on chronic respiratory failure w hypercarbia and hypoxia  Severe tracheobronchomalacia Trach status Hx COPD  -Recurrent episodes of desats with hyperdynamic collapse of airway in spite of increasing PEEP and repositioning trach -etiology of trach occlusion, dynamic airway collapse. Associated severe dyssynchrony  Plan: Respiratory status appears to have stabilized with no more episodes of desaturation since weekend Now with status myoclonic seizures, poor prognosis for recovery She is in persistently vegetative state Avoid pressure support weans as she is unlikely to be liberated from the ventilator Ongoing discussions noted with family regarding goals of care Continue bivona trach   Pulmonary will continue to intermittently follow   Chilton Greathouse MD [facility] See Amion for pager  If no response to pager , please call [phone] until 7pm After 7:00 pm call Elink  [phone] 12/12/2020, 9:02 AM

## 2020-12-13 DIAGNOSIS — R569 Unspecified convulsions: Secondary | ICD-10-CM | POA: Diagnosis not present

## 2020-12-13 DIAGNOSIS — G931 Anoxic brain damage, not elsewhere classified: Secondary | ICD-10-CM | POA: Diagnosis not present

## 2020-12-13 DIAGNOSIS — J398 Other specified diseases of upper respiratory tract: Secondary | ICD-10-CM | POA: Diagnosis not present

## 2020-12-13 DIAGNOSIS — J9621 Acute and chronic respiratory failure with hypoxia: Secondary | ICD-10-CM | POA: Diagnosis not present

## 2020-12-13 LAB — COMPREHENSIVE METABOLIC PANEL
ALT: 14 U/L (ref 0–44)
AST: 16 U/L (ref 15–41)
Albumin: 2.4 g/dL — ABNORMAL LOW (ref 3.5–5.0)
Alkaline Phosphatase: 73 U/L (ref 38–126)
Anion gap: 7 (ref 5–15)
BUN: 20 mg/dL (ref 8–23)
CO2: 26 mmol/L (ref 22–32)
Calcium: 10.1 mg/dL (ref 8.9–10.3)
Chloride: 104 mmol/L (ref 98–111)
Creatinine, Ser: 0.41 mg/dL — ABNORMAL LOW (ref 0.44–1.00)
GFR, Estimated: >60 mL/min (ref >60)
Glucose, Bld: 117 mg/dL — ABNORMAL HIGH (ref 70–99)
Potassium: 3.8 mmol/L (ref 3.5–5.1)
Sodium: 137 mmol/L (ref 135–145)
Total Bilirubin: 0.3 mg/dL (ref 0.3–1.2)
Total Protein: 5.7 g/dL — ABNORMAL LOW (ref 6.5–8.1)

## 2020-12-13 LAB — URINALYSIS, COMPLETE (UACMP) WITH MICROSCOPIC
Bilirubin Urine: NEGATIVE
Glucose, UA: NEGATIVE mg/dL
Hgb urine dipstick: NEGATIVE
Ketones, ur: NEGATIVE mg/dL
Leukocytes,Ua: NEGATIVE
Nitrite: NEGATIVE
Protein, ur: NEGATIVE mg/dL
Specific Gravity, Urine: 1.01 (ref 1.005–1.030)
pH: 8 (ref 5.0–8.0)

## 2020-12-13 LAB — CBC WITH DIFFERENTIAL/PLATELET
Abs Immature Granulocytes: 0.04 10*3/uL (ref 0.00–0.07)
Basophils Absolute: 0 10*3/uL (ref 0.0–0.1)
Basophils Relative: 0 %
Eosinophils Absolute: 0.3 10*3/uL (ref 0.0–0.5)
Eosinophils Relative: 4 %
HCT: 31.9 % — ABNORMAL LOW (ref 36.0–46.0)
Hemoglobin: 10.1 g/dL — ABNORMAL LOW (ref 12.0–15.0)
Immature Granulocytes: 1 %
Lymphocytes Relative: 25 %
Lymphs Abs: 2.2 10*3/uL (ref 0.7–4.0)
MCH: 27.7 pg (ref 26.0–34.0)
MCHC: 31.7 g/dL (ref 30.0–36.0)
MCV: 87.4 fL (ref 80.0–100.0)
Monocytes Absolute: 0.8 10*3/uL (ref 0.1–1.0)
Monocytes Relative: 10 %
Neutro Abs: 5.2 10*3/uL (ref 1.7–7.7)
Neutrophils Relative %: 60 %
Platelets: 211 10*3/uL (ref 150–400)
RBC: 3.65 MIL/uL — ABNORMAL LOW (ref 3.87–5.11)
RDW: 17.4 % — ABNORMAL HIGH (ref 11.5–15.5)
WBC: 8.6 10*3/uL (ref 4.0–10.5)
nRBC: 0 % (ref 0.0–0.2)

## 2020-12-13 LAB — GLUCOSE, CAPILLARY
Glucose-Capillary: 86 mg/dL (ref 70–99)
Glucose-Capillary: 114 mg/dL — ABNORMAL HIGH (ref 70–99)
Glucose-Capillary: 132 mg/dL — ABNORMAL HIGH (ref 70–99)
Glucose-Capillary: 144 mg/dL — ABNORMAL HIGH (ref 70–99)
Glucose-Capillary: 97 mg/dL (ref 70–99)

## 2020-12-13 LAB — C-REACTIVE PROTEIN: CRP: 1.1 mg/dL — ABNORMAL HIGH (ref <1.0)

## 2020-12-13 LAB — LACTIC ACID, PLASMA: Lactic Acid, Venous: 1.9 mmol/L (ref 0.5–1.9)

## 2020-12-13 MED ORDER — LACTATED RINGERS IV BOLUS: 1000.0000 mL | INTRAVENOUS | Status: DC | PRN

## 2020-12-13 MED ORDER — MIDODRINE HCL 5 MG PO TABS
2.5000 mg | ORAL_TABLET | Freq: Three times a day (TID) | ORAL | Status: DC
  Administered 2020-12-13: 2.5 mg via NASOGASTRIC
  Filled 2020-12-13 (×2): qty 1

## 2020-12-13 MED ORDER — LACTATED RINGERS IV BOLUS
1000.0000 mL | Freq: Once | INTRAVENOUS | Status: AC
  Administered 2020-12-13: 1000 mL via INTRAVENOUS

## 2020-12-13 MED ORDER — FREE WATER
200.0000 mL | Status: DC
  Administered 2020-12-13 – 2021-01-27 (×271): 200 mL

## 2020-12-13 MED ORDER — MIDODRINE HCL 5 MG PO TABS
5.0000 mg | ORAL_TABLET | Freq: Three times a day (TID) | ORAL | Status: DC
  Administered 2020-12-13 (×2): 5 mg via NASOGASTRIC
  Filled 2020-12-13 (×2): qty 1

## 2020-12-13 NOTE — Progress Notes (Signed)
[facility] PROGRESS NOTE  Sonia Drake MRN:[id number] DOB: 04/30/1955 DOA: 09/28/2020 PCP: Amin, Saad, MD  Status: Remains inpatient appropriate because:Altered mental status, Unsafe d/c plan, and Inpatient level of care appropriate due to severity of illness  Dispo: The patient is from: SNF              Anticipated d/c is to: SNF Ventilator capable[facility] and [facility] [place] but due to the fact she has an adjustable trach and given her recurrent airway issues as described above they have declined to take her this particular facility.                Patient currently is not medically stable to d/c.   Difficult to place patient Yes   Level of care: ICU  Code Status: Full-pulmonary medicine documents paperwork that presents some conflicting information about the patient's wishes Family Communication: Spoke with patient's daughter in detail on 8/17.  Discussed CODE STATUS as well as futility of care.  Daughter not yet ready to change mother to DNR or advance to comfort measures.  8/22 updated daughter regarding recurrence of status epilepticus and explained that this was a poor prognostic indicator.  Daughter attempted to attach cousin to the call but cousin did not answer phone. DVT prophylaxis: Lovenox COVID vaccination status: Per H&P patient is not vaccinated  **8/22 extensive conversations held with both patient's daughter [name] as well as patient's niece [name].  Please see separate documentation regarding these conversations**  The following pertains to patient's living will dated June 22nd, 2016: 1) no sedative medications to keep her asleep.  She wants to be fully awake regardless of pain 2) she requested all means to keep her alive.  She wants to remain on life support until her daughter, [name] and the grandchildren decide otherwise after consulting with the medical team 3) she also hand wrote that she wishes to be allowed 30 to 90 days to see if she  can pull through  HPI: 64 y/o female with medical history significant of asthma/COPD, CHF, cardiac arrest with PEA/CPR with chronic respiratory failure on permanent trach with supplemental oxygen at 6L, type 2 DM, GERD, seizures and severe hypoxic-ischemic encephalopathy who presented to ER via EMS for respiratory distress from SNF.  She presented with findings consistent with acute aspiration pneumonitis.  She decompensated after arrival and required transfer to ICU and initiation of mechanical ventilation.  She had sepsis physiology presumed secondary to GPC bacteremia but it was later determined that the blood culture results were related to contaminants and patient was no longer determined to be septic   She had previously been ventilator dependent but had been weaned from the ventilator briefly during her stay. On 6/16 a bronchoscopy revealed dynamic collapse of her trachea, tracheostomy was changed from 8.0 XLT cuffless to 6.0 XLT cuffed. She now is requiring chronic ventilatory support 2/2 advanced COPD/asthma and tracheobronchomalacia. PCCM physicians updated patient's daughter on status and poor prognosis along with recommendation for chronic vent.  Palliative medicine has had multiple discussions with the daughter.  Daughter continues to request aggressive care.  Unfortunately she continues to have multiple daily events regarding tracheal collapse with mucous plugging requiring excessive lavage and suction as well as bag-valve-mask for ventilation  Patient has had reemergence of continuous seizure activity.  Initially detected by noting patient with rhythmic blinking.  Continuous EEG revealed ongoing seizure activity.  On 8/23 during exam with sternal rub patient had what appeared to be decerebrate posturing,   eyes were opened and noted with sluggish response to pupils.  Not long after eyes were opened for examination patient began having forceful, rhythmic spasms of the bilateral face that  appeared consistent with seizure activity.  I returned to her room 1 hour later and her eyes were open and she was blinking constantly.  She had no blink reflex when tested.  She had another facial twitch that appeared consistent with seizure activity.  RN at bedside and witnessed these findings.  Significant Hospital events: 6/12 admitted from skilled nursing facility after aspiration event and copious secretions per tracheostomy 6/13 appears comfortable currently on 35% ATC. No distress. WBC ct trending down. No events overnight growing GPC clusters in two of two cultures  6/16 increased work of breathing, upper airway noise, abdominal muscle use.  Bronchoscopy performed as trach exchange cuffless #8 Shiley XLT showed dynamic airway collapse, tracheal collapse. 6/16 continued respiratory distress moved to ICU, changed to cuffed #6 Shiley XLT, placed on MV with some improved comfort 6/12 cefepime > 6/14, 6/18 6/12 vanc > 6/14, 6/18 6/23 mucus plugging, severe vent dyssynchrony 6/24-25 intermittent jerking, seizures on EEG 6/27 weaned off propofol gtt 6/28 briefly tolerated trach collar in AM 6/29 tolerated trach collar in AM; no further seizure like activity; working with CM/SW for vent-SNF 7/1 Weaning on CPAP/ PS 10/5, CM SW working on [employer], NO seizure activity, secretions are not an issue per nursing 7/11 trach changed to #8 Bivona with air cuff, much improved. 7/12 TCT 12 hours; rested on Vent overnight 7/13 on TCT 7/23 Trach collar tolerated 7/24 Full vent support 7/26>> Remains on full vent support this am, no issues with secretions per nursing 8/5 occluding trach with dynamic collapse: seen on bronchoscopy 8/6 called back emergently to bedside for tracheostomy occlusion: emergent bronch, bivona trach migrated back 2cm, improved with advancing to 2cm above carina 8/8 collapse caused hypoxia. Improved with repositioning  8/9 afternoon had another episode of tracheal collapse and  occlusion of trach requiring BVM, sedation. Family produced paperwork which seems to have conflicting information about pts wishes  8/11: pt continues to have multiple daily episodes of tracheal collapse/mucous plugging req lavage, BVM. Repeated discussions with family re: code status esp in light of paperwork  8/15 consultation placed with ethics committee to evaluate regarding futility of care 8/17 spoke at length with patient's daughter regarding futility of care CODE STATUS.  Daughter continues to desire aggressive measures.  Subjective: Patient remains unresponsive.  Objective: Vitals:   12/13/20 0725 12/13/20 0736  BP:    Pulse: 85 80  Resp: 15 18  Temp: 98.9 F (37.2 C)   SpO2: 100% 100%    Intake/Output Summary (Last 24 hours) at 12/13/2020 0754 Last data filed at 12/13/2020 0600 Gross per 24 hour  Intake 1885 ml  Output 1225 ml  Net 660 ml   Filed Weights   12/09/20 2341 12/10/20 0426 12/11/20 0400  Weight: 88.9 kg 88.9 kg 87.9 kg    Exam:  Constitutional: Unresponsive, no respiratory distress detected Respiratory: 8.0 cuffed Bivona air trach, ventilator settings: PRVC mode,VT 400, Rate 16, PEEP 5, PS 15, FiO2 30%; Lungs remain clear and coarse.  At times able to breathe over the ventilator at other times is fully assisted. Cardiovascular: Pulse regular with underlying sinus rhythm sinus tach, stable peripheral edema, normotensive. Abdomen: Nondistended with normoactive bowel sounds. PEG tube for feedings. LBM 8/22 Skin: Stage II decubitus of sacrum-not examined today Neurologic: Remains unresponsive.  No response to painful stimulus  when applied.  Able to precipitate focal seizure activity with opening eyes.  Pupils are about 4 to 5 mm and very sluggish to reaction Psychiatric: Eyes closed.  Unresponsive    Assessment/Plan: Acute problems: Acute on chronic respiratory failure w/ hypoxemia Tracheostomy/vent dependence 2/2 Tracheobronchomalcia and advanced  COPD/asthma Not a candidate to wean from ventilator or decannulation secondary to altered mental status as well as continued issues with tracheal collapse requiring adjustable trach PCCM physician spoke at length with daughter about chronicity of these medical problems and that patient will always be ventilator dependent.  Daughter opted to continue current aggressive care.   I also spoke at length with daughter on 8/17 and she wishes to continue aggressive care  Patient continues to have issues with intermittent breath stacking as well as tracheal collapse.  Required Ativan overnight but then developed reflexive hypotension. (See below)  Known anoxic brain injury after cardiac arrest  Patient without any meaningful improvement.  Currently she continues to have underlying seizure activity as evidenced by rhythmic eye blinking and focal twitching in face and eyes. 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 Prognosis extremely poor and at this juncture it appears we are approaching futility of care.  Palliative medicine has reached out to the family on multiple occasions but they continue to wish for aggressive care.  As of 8/24 I have reconsulted palliative medicine given not only worsening of neurological function but also continued issues with ventilation Appreciate assistance of ethics team regarding futility of care concerns  Seizure disorder Present prior to her cardiac arrest is worsened since her anoxic brain injury Continue Keppra, clonazepam (increased to 1 mg BID on 8/22) and Depakote Episodic issues related to status epilepticus requiring formal neurological consultation Patient with repetitive blinking--EEG with run of polyspikes on EEG. likely due to h/o anoxic  injury. (per Dr Hortense Ramal) LTM obtained oon 8/19-20 showed continued seizure spikes, attending physician over the weekend spoke with Dr. Hortense Ramal on 8/21 -Per neurologist: "Of note, patient has been in persistent vegetative state and I do not believe that treating the seizures will lead to any improvement in patient's outcome.  I would favor not aggressively treating them with antiepileptic medications as this would likely lead to sedation"  History of cardiac arrest Review of outpatient cardiology documents from 09/08/2019 revealed no evidence of CAD Patient was treated for cardiac arrest 2/2 acute hypoxia in August 2021 at [facility].  According to EMS records during that admission patient called 911 in context of respiratory failure.  She was gasping for air and then became unresponsive.  Upon EMS arrival patient was pulseless and apneic.  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  Given suspected etiology secondary to dysautonomia will change to midodrine 5 mg every 6 hours gradual -Did have low-grade fever 99 so as a precaution we will rule out sepsis physiology by checking blood cultures along with urinalysis and culture.  Most recent chest x-ray unremarkable for infiltrates.  We will also check lecture lites and CBC with differential **WBC normal, lactic acid normal, CRP only slightly elevated at 1.1, renal function normal, glucose normal, WBC is  normal without shift.  Blood cultures are pending.  Urinalysis and culture have not been obtained**  History of CVA Continue Plavix   Hyponatremia Resolved after holding free water current sodium 139 Since initiation of free water flushes sodium has remained stable  with current reading of 137  Diabetes mellitus 2 on long-term insulin with hyperglycemia Continue Lantus and SSI Hemoglobin A1c 7.0 Current CBGs well-controlled   Hypertension Blood pressure times has been suboptimal therefore beta-blocker discontinued.   Chronic diastolic dysfunction Echocardiogram this admission with grade 1 diastolic dysfunction, moderate LV hypertrophy hyperdynamic LV systolic function Currently euvolemic Monitor I/O Continue as needed beta-blocker for elevated heart rate SBP remains suboptimal Not on ACE or ARB secondary to suboptimal blood pressure reading   Chronic protein malnutrition secondary to sequelae from anoxic brain injury/PEG tube dependence Continue current tube feedings with Prosource Nutrition Problem: Increased nutrient needs Etiology: wound healing Signs/Symptoms: estimated needs Interventions: Tube feeding Body mass index is 35.44 kg/m.   Anemia of chronic disease Hemoglobin remained stable around 10  Stage II sacral decubitus Wound / Incision (Open or Dehisced) 10/31/20 (MASD) Moisture Associated Skin Damage Buttocks Bilateral (Active)  Date First Assessed/Time First Assessed: 10/31/20 2100   Wound Type: (MASD) Moisture Associated Skin Damage  Location: Buttocks  Location Orientation: Bilateral  Present on Admission: No    Assessments 10/31/2020  9:20 PM 12/13/2020  3:30 AM  Dressing Type None;Other (Comment) Foam - Lift dressing to assess site every shift  Dressing Status -- Clean;Dry;Intact  Dressing Change Frequency -- PRN  Site / Wound Assessment Pink Dressing in place / Unable to assess  Treatment Cleansed --     No Linked orders to display       Data Reviewed: Basic Metabolic Panel: Recent Labs  Lab 12/06/20 0834  NA 139  K 3.8  CL 106  CO2 27  GLUCOSE 134*  BUN 20  CREATININE 0.41*  CALCIUM 10.5*   Liver Function Tests: No results for input(s): AST, ALT, ALKPHOS, BILITOT, PROT, ALBUMIN in the last 168  hours.   No results for input(s): LIPASE, AMYLASE in the last 168 hours. No results for input(s): AMMONIA in the last 168 hours. CBC: Recent Labs  Lab 12/06/20 0834  WBC 7.9  HGB 11.0*  HCT 35.6*  MCV 86.4  PLT 306   Cardiac Enzymes: No results for input(s): CKTOTAL, CKMB, CKMBINDEX, TROPONINI in the last 168 hours. BNP (last 3 results) Recent Labs    10/04/20 0051 10/05/20 0049 10/06/20 0548  BNP 145.8* 115.3* 125.4*    ProBNP (last 3 results) No results for input(s): PROBNP in the last 8760 hours.  CBG: Recent Labs  Lab 12/12/20 1528 12/12/20 1944 12/12/20 2311 12/13/20 0308 12/13/20 0725  GLUCAP 144* 151* 163* 86 114*    No results found for this or any previous visit (from the past 240 hour(s)).    Studies: No results found.  Scheduled Meds:  arformoterol  15 mcg Nebulization BID   baclofen  5 mg Per Tube TID   budesonide  0.5 mg Nebulization BID   chlorhexidine gluconate (MEDLINE KIT)  15 mL Mouth Rinse BID   Chlorhexidine Gluconate Cloth  6 each Topical Daily   clonazePAM  1 mg Per Tube BID   clopidogrel  75 mg Per Tube Daily   enoxaparin (LOVENOX) injection  0.5 mg/kg Subcutaneous Q24H   famotidine  20 mg Per Tube BID   feeding supplement (PROSource TF)  45 mL Per Tube BID   fiber  1  packet Per Tube BID   free water  200 mL Per Tube Q4H   Gerhardt's butt cream   Topical BID   guaiFENesin  5 mL Per Tube Q6H   hydrocortisone cream   Topical TID   insulin aspart  0-15 Units Subcutaneous Q4H   insulin glargine  40 Units Subcutaneous QHS   levETIRAcetam  2,500 mg Per Tube BID   loratadine  10 mg Per Tube Daily   mouth rinse  15 mL Mouth Rinse QID   midodrine  5 mg Per NG tube TID AC   montelukast  10 mg Per Tube Daily   nutrition supplement (JUVEN)  1 packet Per Tube BID   revefenacin  175 mcg Nebulization Daily   tiZANidine  2 mg Per Tube QHS   valproic acid  1,000 mg Per Tube Q12H   Continuous Infusions:  sodium chloride     feeding  supplement (JEVITY 1.5 CAL/FIBER) 1,000 mL (12/12/20 1606)    Principal Problem:   Sepsis ([place]) Active Problems:   COPD (chronic obstructive pulmonary disease) (HCC)   Chronic diastolic CHF (congestive heart failure) (HCC)   Acute on chronic respiratory failure with hypoxia (HCC)   GERD (gastroesophageal reflux disease)   Seizures ([place])   Type 2 diabetes mellitus without complication, with long-term current use of insulin (HCC)   Severe hypoxic-ischemic encephalopathy   Tracheostomy dependence ([place])   Pressure injury of skin   Difficult airway   Ventilator dependence ([place])   Acquired tracheal collapse   Anoxic brain injury [facility])   Consultants: PCCM Palliative medicine Neurology  Procedures: Echocardiogram Replacement of trach  Antibiotics: Cefepime 6/12 through 6/14 Flagyl 6/12 through 6/14 Vancomycin 6/12 through 6/14 Cefepime x1 dose 6/18 Vancomycin x2 doses 6/18, 6/19   Time spent: 45 minutes    Erin Hearing ANP  [facility] 7 am - 330 pm/M-F for direct patient care and secure chat Please refer to Amion for contact info 73  days

## 2020-12-13 NOTE — Progress Notes (Addendum)
Pts with increased WOB, breath stacking and breathing over the vent, O2 sats dropped to 82%, RN suctioned and attempted to reposition pt and pts trach with no improvement. 2mg  of ativan given with no improvement. RRT called to bedside, another 2 mg of ativan given with minimal improvement. RRT began BVM. ELINK notified and stated pts trach is extremely positional and recommended advancing trach and tightening trach ties -- pt improved after advancing trach and adjusting trach ties. Will continue to monitor closely.

## 2020-12-13 NOTE — Progress Notes (Signed)
CSW spoke with Suzette Battiest, RN on unit regarding patient. RN agreeable to discuss reconsulting palliative with NP and MD. Per RN, patient is only able to tolerate being on the trach collar for a few hours at a time every few days.   CSW reviewed Advanced Directive documents in patient's chart - documentation was signed and notarized in 2016. Document states patient wants to be given 30-90 days to recover from a medical incident prior to her family being required to make a decision for end of life care.  CSW will continue following for discharge planning.  [name], MSW, LCSW Transitions of Care  Clinical Social Worker II [phone]

## 2020-12-13 NOTE — Plan of Care (Signed)
  Problem: Health Behavior/Discharge Planning: Goal: Ability to manage health-related needs will improve Outcome: Progressing   Problem: Clinical Measurements: Goal: Ability to maintain clinical measurements within normal limits will improve Outcome: Progressing Goal: Will remain free from infection Outcome: Progressing Goal: Diagnostic test results will improve Outcome: Progressing Goal: Cardiovascular complication will be avoided Outcome: Progressing   Problem: Activity: Goal: Risk for activity intolerance will decrease Outcome: Progressing   Problem: Elimination: Goal: Will not experience complications related to bowel motility Outcome: Progressing Goal: Will not experience complications related to urinary retention Outcome: Progressing   Problem: Pain Managment: Goal: General experience of comfort will improve Outcome: Progressing   Problem: Safety: Goal: Ability to remain free from injury will improve Outcome: Progressing   Problem: Skin Integrity: Goal: Risk for impaired skin integrity will decrease Outcome: Progressing   Problem: Activity: Goal: Ability to tolerate increased activity will improve Outcome: Progressing   Problem: Activity: Goal: Ability to tolerate increased activity will improve Outcome: Progressing   Problem: Health Behavior/Discharge Planning: Goal: Ability to manage tracheostomy will improve Outcome: Progressing   Problem: Respiratory: Goal: Patent airway maintenance will improve Outcome: Progressing   Problem: Clinical Measurements: Goal: Respiratory complications will improve Outcome: Not Progressing   Problem: Respiratory: Goal: Ability to maintain a clear airway and adequate ventilation will improve Outcome: Not Progressing  Pt continues to have issues with tracheomalacia and airway collapse.

## 2020-12-13 NOTE — Plan of Care (Signed)
  Problem: Health Behavior/Discharge Planning: Goal: Ability to manage health-related needs will improve Outcome: Not Progressing   Problem: Clinical Measurements: Goal: Ability to maintain clinical measurements within normal limits will improve Outcome: Not Progressing Goal: Will remain free from infection Outcome: Not Progressing Goal: Diagnostic test results will improve Outcome: Not Progressing Goal: Respiratory complications will improve Outcome: Not Progressing Goal: Cardiovascular complication will be avoided Outcome: Not Progressing   Problem: Activity: Goal: Risk for activity intolerance will decrease Outcome: Not Progressing   Problem: Elimination: Goal: Will not experience complications related to bowel motility Outcome: Not Progressing Goal: Will not experience complications related to urinary retention Outcome: Not Progressing   Problem: Pain Managment: Goal: General experience of comfort will improve Outcome: Not Progressing   Problem: Safety: Goal: Ability to remain free from injury will improve Outcome: Not Progressing   Problem: Skin Integrity: Goal: Risk for impaired skin integrity will decrease Outcome: Not Progressing   Problem: Activity: Goal: Ability to tolerate increased activity will improve Outcome: Not Progressing   Problem: Respiratory: Goal: Ability to maintain a clear airway and adequate ventilation will improve Outcome: Not Progressing   Problem: Activity: Goal: Ability to tolerate increased activity will improve Outcome: Not Progressing   Problem: Health Behavior/Discharge Planning: Goal: Ability to manage tracheostomy will improve Outcome: Not Progressing   Problem: Respiratory: Goal: Patent airway maintenance will improve Outcome: Not Progressing

## 2020-12-13 NOTE — Progress Notes (Signed)
Pts BP dropped to 70/45 after receiving ativan. No IVF. BP cuff repositioned. [facility] on call provider notified. Will continue to monitor closely.

## 2020-12-13 NOTE — Progress Notes (Signed)
eLink Physician-Brief Progress Note Patient Name: Sonia Drake DOB: [birth year] MRN: [id number]   Date of Service  12/13/2020  HPI/Events of Note  Patient is hypotensive. 1000 iv fluid bolus ordered by [facility] (they are primary on the case).  eICU Interventions  Bedside RN instructed to call E-Link stat if fluid bolus fails to resolve the hypotension.        Thomasene Lot Sonia Drake 12/13/2020, 5:17 AM

## 2020-12-13 NOTE — Progress Notes (Signed)
[facility] night shift ICU coverage note.  The nursing staff reported that the patient was hypotensive after receiving lorazepam with a systolic blood pressure in the 60s.  Lactated Ringer 1000 mL IV bolus ordered.  If not effective lactated ringer 1000 mL IV bolus ordered as needed if MAP is less than .  Sanda Klein, MD.

## 2020-12-14 DIAGNOSIS — J9621 Acute and chronic respiratory failure with hypoxia: Secondary | ICD-10-CM | POA: Diagnosis not present

## 2020-12-14 DIAGNOSIS — R7881 Bacteremia: Secondary | ICD-10-CM

## 2020-12-14 DIAGNOSIS — G931 Anoxic brain damage, not elsewhere classified: Secondary | ICD-10-CM | POA: Diagnosis not present

## 2020-12-14 DIAGNOSIS — Z930 Tracheostomy status: Secondary | ICD-10-CM | POA: Diagnosis not present

## 2020-12-14 DIAGNOSIS — B957 Other staphylococcus as the cause of diseases classified elsewhere: Secondary | ICD-10-CM

## 2020-12-14 DIAGNOSIS — J398 Other specified diseases of upper respiratory tract: Secondary | ICD-10-CM | POA: Diagnosis not present

## 2020-12-14 LAB — BLOOD CULTURE ID PANEL (REFLEXED) - BCID2

## 2020-12-14 LAB — GLUCOSE, CAPILLARY
Glucose-Capillary: 103 mg/dL — ABNORMAL HIGH (ref 70–99)
Glucose-Capillary: 105 mg/dL — ABNORMAL HIGH (ref 70–99)
Glucose-Capillary: 108 mg/dL — ABNORMAL HIGH (ref 70–99)
Glucose-Capillary: 127 mg/dL — ABNORMAL HIGH (ref 70–99)
Glucose-Capillary: 132 mg/dL — ABNORMAL HIGH (ref 70–99)
Glucose-Capillary: 149 mg/dL — ABNORMAL HIGH (ref 70–99)
Glucose-Capillary: 88 mg/dL (ref 70–99)

## 2020-12-14 MED ORDER — KETAMINE HCL 50 MG/5ML IJ SOSY
PREFILLED_SYRINGE | INTRAMUSCULAR | Status: AC
  Filled 2020-12-14: qty 5

## 2020-12-14 MED ORDER — FENTANYL CITRATE (PF) 100 MCG/2ML IJ SOLN
INTRAMUSCULAR | Status: AC
  Filled 2020-12-14: qty 2

## 2020-12-14 MED ORDER — MIDODRINE HCL 5 MG PO TABS
2.5000 mg | ORAL_TABLET | Freq: Three times a day (TID) | ORAL | Status: DC
  Administered 2020-12-14 – 2020-12-21 (×20): 2.5 mg via NASOGASTRIC
  Filled 2020-12-14 (×23): qty 1

## 2020-12-14 MED ORDER — MIDAZOLAM HCL 2 MG/2ML IJ SOLN
INTRAMUSCULAR | Status: AC
  Filled 2020-12-14: qty 2

## 2020-12-14 MED ORDER — ETOMIDATE 2 MG/ML IV SOLN
INTRAVENOUS | Status: AC
  Filled 2020-12-14: qty 20

## 2020-12-14 MED ORDER — ROCURONIUM BROMIDE 10 MG/ML (PF) SYRINGE
PREFILLED_SYRINGE | INTRAVENOUS | Status: AC
  Filled 2020-12-14: qty 10

## 2020-12-14 NOTE — Progress Notes (Signed)
[facility] PROGRESS NOTE  Sonia [place] MRN:[id number] DOB: 07/20/1955 DOA: 09/29/2020 PCP: Amin, Saad, MD  Status: Remains inpatient appropriate because:Altered mental status, Unsafe d/c plan, and Inpatient level of care appropriate due to severity of illness  Dispo: The patient is from: SNF              Anticipated d/c is to: SNF [facility] and [facility] but due to the fact she has an adjustable trach and given her recurrent airway issues as described above they have declined to take her this particular facility.                Patient currently is not medically stable to d/c.   Difficult to place patient Yes   Level of care: ICU  Code Status: Full-pulmonary medicine documents paperwork that presents some conflicting information about the patient's wishes Family Communication: Spoke with patient's daughter in detail on 8/17.  Discussed CODE STATUS as well as futility of care.  Daughter not yet ready to change mother to DNR or advance to comfort measures.  8/22 updated daughter regarding recurrence of status epilepticus and explained that this was a poor prognostic indicator.  Daughter attempted to attach cousin to the call but cousin did not answer phone. DVT prophylaxis: Lovenox COVID vaccination status: Per H&P patient is not vaccinated  **8/22 extensive conversations held with both patient's daughter Sonia [place] as well as patient's niece Sonia [place].  Please see separate documentation regarding these conversations**  The following pertains to patient's living will dated June 22nd, 2016: 1) no sedative medications to keep her asleep.  She wants to be fully awake regardless of pain 2) she requested all means to keep her alive.  She wants to remain on life support until her daughter, Sonia [place] and the grandchildren decide otherwise after consulting with the medical team 3) she also hand wrote that she wishes to be allowed 30 to 90 days to see if she  can pull through  HPI: 64 y/o female with medical history significant of asthma/COPD, CHF, cardiac arrest with PEA/CPR with chronic respiratory failure on permanent trach with supplemental oxygen at 6L, type 2 DM, GERD, seizures and severe hypoxic-ischemic encephalopathy who presented to ER via EMS for respiratory distress from SNF.  She presented with findings consistent with acute aspiration pneumonitis.  She decompensated after arrival and required transfer to ICU and initiation of mechanical ventilation.  She had sepsis physiology presumed secondary to GPC bacteremia but it was later determined that the blood culture results were related to contaminants and patient was no longer determined to be septic   She had previously been ventilator dependent but had been weaned from the ventilator briefly during her stay. On 6/16 a bronchoscopy revealed dynamic collapse of her trachea, tracheostomy was changed from 8.0 XLT cuffless to 6.0 XLT cuffed. She now is requiring chronic ventilatory support 2/2 advanced COPD/asthma and tracheobronchomalacia. PCCM physicians updated patient's daughter on status and poor prognosis along with recommendation for chronic vent.  Palliative medicine has had multiple discussions with the daughter.  Daughter continues to request aggressive care.  Unfortunately she continues to have multiple daily events regarding tracheal collapse with mucous plugging requiring excessive lavage and suction as well as bag-valve-mask for ventilation  Patient has had reemergence of continuous seizure activity.  Initially detected by noting patient with rhythmic blinking.  Continuous EEG revealed ongoing seizure activity.  On 8/23 during exam with sternal rub patient had what appeared to be decerebrate posturing,   eyes were opened and noted with sluggish response to pupils.  Not long after eyes were opened for examination patient began having forceful, rhythmic spasms of the bilateral face that  appeared consistent with seizure activity.  I returned to her room 1 hour later and her eyes were open and she was blinking constantly.  She had no blink reflex when tested.  She had another facial twitch that appeared consistent with seizure activity.  RN at bedside and witnessed these findings.  Significant Hospital events: 6/12 admitted from skilled nursing facility after aspiration event and copious secretions per tracheostomy 6/13 appears comfortable currently on 35% ATC. No distress. WBC ct trending down. No events overnight growing GPC clusters in two of two cultures  6/16 increased work of breathing, upper airway noise, abdominal muscle use.  Bronchoscopy performed as trach exchange cuffless #8 [place]iley XLT showed dynamic airway collapse, tracheal collapse. 6/16 continued respiratory distress moved to ICU, changed to cuffed #6 Shiley XLT, placed on MV with some improved comfort 6/12 cefepime > 6/14, 6/18 6/12 vanc > 6/14, 6/18 6/23 mucus plugging, severe vent dyssynchrony 6/24-25 intermittent jerking, seizures on EEG 6/27 weaned off propofol gtt 6/28 briefly tolerated trach collar in AM 6/29 tolerated trach collar in AM; no further seizure like activity; working with CM/SW for vent-SNF 7/1 Weaning on CPAP/ PS 10/5, CM SW working on [employer], NO seizure activity, secretions are not an issue per nursing 7/11 trach changed to #8 Bivona with air cuff, much improved. 7/12 TCT 12 hours; rested on Vent overnight 7/13 on TCT 7/23 Trach collar tolerated 7/24 Full vent support 7/26>> Remains on full vent support this am, no issues with secretions per nursing 8/5 occluding trach with dynamic collapse: seen on bronchoscopy 8/6 called back emergently to bedside for tracheostomy occlusion: emergent bronch, bivona trach migrated back 2cm, improved with advancing to 2cm above carina 8/8 collapse caused hypoxia. Improved with repositioning  8/9 afternoon had another episode of tracheal collapse and  occlusion of trach requiring BVM, sedation. Family produced paperwork which seems to have conflicting information about pts wishes  8/11: pt continues to have multiple daily episodes of tracheal collapse/mucous plugging req lavage, BVM. Repeated discussions with family re: code status esp in light of paperwork  8/15 consultation placed with ethics committee to evaluate regarding futility of care 8/17 spoke at length with patient's daughter regarding futility of care CODE STATUS.  Daughter continues to desire aggressive measures.  Subjective: Patient remains unresponsive although at times she will withdraw all right side to extreme painful stimulation.  Objective: Vitals:   12/14/20 0700 12/14/20 0748  BP: (!) 141/73   Pulse: 88   Resp: (!) 21   Temp:  (!) 97.5 F (36.4 C)  SpO2: 99%     Intake/Output Summary (Last 24 hours) at 12/14/2020 0753 Last data filed at 12/14/2020 0700 Gross per 24 hour  Intake 2340 ml  Output 2200 ml  Net 140 ml   Filed Weights   12/09/20 2341 12/10/20 0426 12/11/20 0400  Weight: 88.9 kg 88.9 kg 87.9 kg    Exam:  Constitutional: Unresponsive, no respiratory distress detected Respiratory: 8.0 cuffed Bivona air trach, ventilator settings: PRVC mode,VT 400, Rate 16, PEEP 5, PS 15, FiO2 30%; Lungs remain clear and coarse.  At times able to breathe over the ventilator at other times is fully assisted. Cardiovascular: Pulse regular with underlying sinus rhythm sinus tach, stable peripheral edema, normotensive. Abdomen: Nondistended with normoactive bowel sounds. PEG tube for feedings. LBM 8/22 Skin:  Stage II decubitus consistent with several mild areas of excoriation without any periwound erythema. Neurologic: Remains unresponsive.  No response to painful stimulus when applied to left and inconsistent withdrawal to pain stimulation on the right.  Able to precipitate focal seizure activity with opening eyes.  Pupils are about 4 to 5 mm and very sluggish to  reaction Psychiatric: Eyes closed.  Unresponsive    Assessment/Plan: Acute problems: Acute on chronic respiratory failure w/ hypoxemia Tracheostomy/vent dependence 2/2 Tracheobronchomalcia and advanced COPD/asthma Not a candidate to wean from ventilator or decannulation secondary to altered mental status as well as continued issues with tracheal collapse requiring adjustable trach PCCM physician spoke at length with daughter about chronicity of these medical problems and that patient will always be ventilator dependent.  Daughter opted to continue current aggressive care.   I also spoke at length with daughter on 8/17 and she wishes to continue aggressive care  Patient continues to have issues with intermittent breath stacking as well as tracheal collapse.  Required Ativan overnight but then developed reflexive hypotension. (See below)  Known anoxic brain injury after cardiac arrest  Patient without any meaningful improvement.  Currently she continues to have underlying seizure activity as evidenced by rhythmic eye blinking and focal twitching in face and eyes. 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 Prognosis extremely poor and at this juncture it appears we are approaching futility of care.  Palliative medicine has reached out to the family on multiple occasions but they continue to wish for aggressive care.  As of 8/24 I have reconsulted palliative medicine given not only worsening of neurological function but also continued issues with ventilation Appreciate assistance of ethics team regarding futility of care concerns  Seizure disorder Present prior to her cardiac arrest is worsened since her anoxic brain injury Continue Keppra, clonazepam (increased to 1 mg  BID on 8/22) and Depakote Episodic issues related to status epilepticus requiring formal neurological consultation Patient with repetitive blinking--EEG with run of polyspikes on EEG. likely due to h/o anoxic injury. (per Dr Hortense Ramal) LTM obtained oon 8/19-20 showed continued seizure spikes, attending physician over the weekend spoke with Dr. Hortense Ramal on 8/21 -Per neurologist: "Of note, patient has been in persistent vegetative state and I do not believe that treating the seizures will lead to any improvement in patient's outcome.  I would favor not aggressively treating them with antiepileptic medications as this would likely lead to sedation"  History of cardiac arrest Review of outpatient cardiology documents from 09/08/2019 revealed no evidence of CAD Patient was treated for cardiac arrest 2/2 acute hypoxia in August 2021 at [facility]  According to EMS records during that admission patient called 911 in context of respiratory failure.  She was gasping for air and then became unresponsive.  Upon EMS arrival patient was pulseless and apneic.  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 this a.m. SBP 130 so have changed order to hold dose if SBP >/= 130  Low-grade fever/abnormal blood cultures -T-max 99 in the past 36 hours -Blood  cultures obtained in 2/2 positive for gram-positive cocci with 1 set with staph epidermis that is methicillin-resistant based on BC ID -Repeat blood cultures this a.m. in the event both bottles had -Appreciate ID assistance and the following recommendations:. "Currently she has no clinical or additional  lab work that would support this. Suspect this is likely to be a contaminate. Recommend continuing hold antibiotics at this time while awaiting new set of cultures results that were drawn this morning. Will ask microbiology to speciate the MRSE to determine if they are differing organisms"  History of CVA Continue Plavix   Hyponatremia Resolved after holding free water current sodium 139 Since initiation of free water flushes sodium has remained stable with current reading of 137  Diabetes mellitus 2 on long-term insulin with hyperglycemia Continue Lantus and SSI Hemoglobin A1c 7.0 Current CBGs well-controlled   Hypertension Blood pressure times has been suboptimal therefore beta-blocker discontinued.   Chronic diastolic dysfunction Echocardiogram this admission with grade 1 diastolic dysfunction, moderate LV hypertrophy hyperdynamic LV systolic function Currently euvolemic Monitor I/O Continue as needed beta-blocker for elevated heart rate SBP remains suboptimal Not on ACE or ARB secondary to suboptimal blood pressure reading   Chronic protein malnutrition secondary to sequelae from anoxic brain injury/PEG tube dependence Continue current tube feedings with Prosource Nutrition Problem: Increased nutrient needs Etiology: wound healing Signs/Symptoms: estimated needs Interventions: Tube feeding Body mass index is 35.44 kg/m.   Anemia of chronic disease Hemoglobin remained stable around 10  Stage II sacral decubitus Wound / Incision (Open or Dehisced) 10/31/20 (MASD) Moisture Associated Skin Damage Buttocks Bilateral (Active)  Date First Assessed/Time First Assessed: 10/31/20 2100   Wound Type: (MASD) Moisture Associated Skin Damage  Location: Buttocks  Location Orientation: Bilateral  Present on Admission: No    Assessments 10/31/2020  9:20 PM 12/13/2020 10:00 AM  Dressing Type None;Other (Comment) --  Site / Wound Assessment Pink --  Wound Length (cm) -- 0 cm  Wound Width  (cm) -- 0 cm  Wound Depth (cm) -- 0 cm  Wound Volume (cm^3) -- 0 cm^3  Wound Surface Area (cm^2) -- 0 cm^2  Treatment Cleansed --     No Linked orders to display       Data Reviewed: Basic Metabolic Panel: Recent Labs  Lab 12/13/20 0846  NA 137  K 3.8  CL 104  CO2 26  GLUCOSE 117*  BUN 20  CREATININE 0.41*  CALCIUM 10.1   Liver Function Tests: Recent Labs  Lab 12/13/20 0846  AST 16  ALT 14  ALKPHOS 73  BILITOT 0.3  PROT 5.7*  ALBUMIN 2.4*     No results for input(s): LIPASE, AMYLASE in the last 168 hours. No results for input(s): AMMONIA in the last 168 hours. CBC: Recent Labs  Lab 12/13/20 0846  WBC 8.6  NEUTROABS 5.2  HGB 10.1*  HCT 31.9*  MCV 87.4  PLT 211   Cardiac Enzymes: No results for input(s): CKTOTAL, CKMB, CKMBINDEX, TROPONINI in the last 168 hours. BNP (last 3 results) Recent Labs    10/04/20 0051 10/05/20 0049 10/06/20 0548  BNP 145.8* 115.3* 125.4*    ProBNP (last 3 results) No results for input(s): PROBNP in the last 8760 hours.  CBG: Recent Labs  Lab 12/13/20 1503 12/13/20 2126 12/14/20 0001 12/14/20 0413 12/14/20 0746  GLUCAP 144* 97 88 127* 105*    Recent Results (from the past 240 hour(s))  Culture, blood (routine x 2)     Status: None (Preliminary result)  Collection Time: 12/13/20  8:46 AM   Specimen: BLOOD LEFT HAND  Result Value Ref Range Status   Specimen Description BLOOD LEFT HAND  Final   Special Requests   Final    BOTTLES DRAWN AEROBIC AND ANAEROBIC Blood Culture results may not be optimal due to an inadequate volume of blood received in culture bottles   Culture  Setup Time   Final    GRAM POSITIVE COCCI CRITICAL VALUE NOTED.  VALUE IS CONSISTENT WITH PREVIOUSLY REPORTED AND CALLED VALUE. Performed at [facility], [place] [address] [place], [place] [address]    Culture GRAM POSITIVE COCCI  Final   Report Status PENDING  Incomplete  Culture, blood (routine x 2)     Status: None (Preliminary  result)   Collection Time: 12/13/20  9:04 AM   Specimen: BLOOD RIGHT HAND  Result Value Ref Range Status   Specimen Description BLOOD RIGHT HAND  Final   Special Requests   Final    BOTTLES DRAWN AEROBIC ONLY Blood Culture results may not be optimal due to an inadequate volume of blood received in culture bottles   Culture  Setup Time   Final    GRAM POSITIVE COCCI AEROBIC BOTTLE ONLY CRITICAL RESULT CALLED TO, READ BACK BY AND VERIFIED WITH: PHARMD GREG ABBOTT 12/14/20@05 :55 BY TW Performed at [facility], [place] [address] [place],  [address]    Culture GRAM POSITIVE COCCI  Final   Report Status PENDING  Incomplete  Blood Culture ID Panel (Reflexed)     Status: Abnormal   Collection Time: 12/13/20  9:04 AM  Result Value Ref Range Status   Enterococcus faecalis NOT DETECTED NOT DETECTED Final   Enterococcus Faecium NOT DETECTED NOT DETECTED Final   Listeria monocytogenes NOT DETECTED NOT DETECTED Final   Staphylococcus species DETECTED (A) NOT DETECTED Final    Comment: CRITICAL RESULT CALLED TO, READ BACK BY AND VERIFIED WITH: PHARMD GREG ABBOTT 12/14/20@05 :55 BY TW    Staphylococcus aureus (BCID) NOT DETECTED NOT DETECTED Final   Staphylococcus epidermidis DETECTED (A) NOT DETECTED Final    Comment: Methicillin (oxacillin) resistant coagulase negative staphylococcus. Possible blood culture contaminant (unless isolated from more than one blood culture draw or clinical case suggests pathogenicity). No antibiotic treatment is indicated for blood  culture contaminants. CRITICAL RESULT CALLED TO, READ BACK BY AND VERIFIED WITH: PHARMD GREG ABBOTT 12/14/20@5 :55 BY TW    Staphylococcus lugdunensis NOT DETECTED NOT DETECTED Final   Streptococcus species NOT DETECTED NOT DETECTED Final   Streptococcus agalactiae NOT DETECTED NOT DETECTED Final   Streptococcus pneumoniae NOT DETECTED NOT DETECTED Final   Streptococcus pyogenes NOT DETECTED NOT DETECTED Final    A.calcoaceticus-baumannii NOT DETECTED NOT DETECTED Final   Bacteroides fragilis NOT DETECTED NOT DETECTED Final   Enterobacterales NOT DETECTED NOT DETECTED Final   Enterobacter cloacae complex NOT DETECTED NOT DETECTED Final   Escherichia coli NOT DETECTED NOT DETECTED Final   Klebsiella aerogenes NOT DETECTED NOT DETECTED Final   Klebsiella oxytoca NOT DETECTED NOT DETECTED Final   Klebsiella pneumoniae NOT DETECTED NOT DETECTED Final   Proteus species NOT DETECTED NOT DETECTED Final   Salmonella species NOT DETECTED NOT DETECTED Final   Serratia marcescens NOT DETECTED NOT DETECTED Final   Haemophilus influenzae NOT DETECTED NOT DETECTED Final   Neisseria meningitidis NOT DETECTED NOT DETECTED Final   Pseudomonas aeruginosa NOT DETECTED NOT DETECTED Final   Stenotrophomonas maltophilia NOT DETECTED NOT DETECTED Final   Candida albicans NOT DETECTED NOT  DETECTED Final   Candida auris NOT DETECTED NOT DETECTED Final   Candida glabrata NOT DETECTED NOT DETECTED Final   Candida krusei NOT DETECTED NOT DETECTED Final   Candida parapsilosis NOT DETECTED NOT DETECTED Final   Candida tropicalis NOT DETECTED NOT DETECTED Final   Cryptococcus neoformans/gattii NOT DETECTED NOT DETECTED Final   Methicillin resistance mecA/C DETECTED (A) NOT DETECTED Final    Comment: CRITICAL RESULT CALLED TO, READ BACK BY AND VERIFIED WITH: PHARMD GREG ABBOTT 12/14/20@5 :55 BY TW Performed at [facility] [address], [place], [place] [address]       Studies: No results found.  Scheduled Meds:  arformoterol  15 mcg Nebulization BID   baclofen  5 mg Per Tube TID   budesonide  0.5 mg Nebulization BID   chlorhexidine gluconate (MEDLINE KIT)  15 mL Mouth Rinse BID   Chlorhexidine Gluconate Cloth  6 each Topical Daily   clonazePAM  1 mg Per Tube BID   clopidogrel  75 mg Per Tube Daily   enoxaparin (LOVENOX) injection  0.5 mg/kg Subcutaneous Q24H   famotidine  20 mg Per Tube BID   feeding  supplement (PROSource TF)  45 mL Per Tube BID   fiber  1 packet Per Tube BID   free water  200 mL Per Tube Q4H   Gerhardt's butt cream   Topical BID   guaiFENesin  5 mL Per Tube Q6H   hydrocortisone cream   Topical TID   insulin aspart  0-15 Units Subcutaneous Q4H   insulin glargine  40 Units Subcutaneous QHS   levETIRAcetam  2,500 mg Per Tube BID   loratadine  10 mg Per Tube Daily   mouth rinse  15 mL Mouth Rinse QID   midodrine  2.5 mg Per NG tube TID AC   montelukast  10 mg Per Tube Daily   nutrition supplement (JUVEN)  1 packet Per Tube BID   revefenacin  175 mcg Nebulization Daily   tiZANidine  2 mg Per Tube QHS   valproic acid  1,000 mg Per Tube Q12H   Continuous Infusions:  sodium chloride     feeding supplement (JEVITY 1.5 CAL/FIBER) 1,000 mL (12/13/20 1846)    Principal Problem:   Sepsis ([place]) Active Problems:   COPD (chronic obstructive pulmonary disease) (HCC)   Chronic diastolic CHF (congestive heart failure) (HCC)   Acute on chronic respiratory failure with hypoxia (HCC)   GERD (gastroesophageal reflux disease)   Seizures (HCC)   Type 2 diabetes mellitus without complication, with long-term current use of insulin (HCC)   Severe hypoxic-ischemic encephalopathy   Tracheostomy dependence ([place])   Pressure injury of skin   Difficult airway   Ventilator dependence ([place])   Acquired tracheal collapse   Anoxic brain injury [facility])   Consultants: PCCM Palliative medicine Neurology  Procedures: Echocardiogram Replacement of trach  Antibiotics: Cefepime 6/12 through 6/14 Flagyl 6/12 through 6/14 Vancomycin 6/12 through 6/14 Cefepime x1 dose 6/18 Vancomycin x2 doses 6/18, 6/19   Time spent: 45 minutes    Erin Hearing ANP  [facility] 7 am - 330 pm/M-F for direct patient care and secure chat Please refer to Amion for contact info 74  days

## 2020-12-14 NOTE — Progress Notes (Signed)
PHARMACY - PHYSICIAN COMMUNICATION CRITICAL VALUE ALERT - BLOOD CULTURE IDENTIFICATION (BCID)  Sonia [facility] is an 65 y.o. female admitted s/p cardiac arrest and anoxic brain injury.  Recent hypotension, Tmax 99.0, WBC 8.6   Assessment:  1/2 blood cultures growing MR-Staphylococcus epidermidis, likely contaminant  Name of physician (or Provider) Contacted:  Dr. Warrick Parisian  Current antibiotics: None  Changes to prescribed antibiotics recommended:  No changes at this time.    Results for orders placed or performed during the hospital encounter of October 21, 2020  Blood Culture ID Panel (Reflexed) (Collected: 12/13/2020  9:04 AM)  Result Value Ref Range   Enterococcus faecalis NOT DETECTED NOT DETECTED   Enterococcus Faecium NOT DETECTED NOT DETECTED   Listeria monocytogenes NOT DETECTED NOT DETECTED   Staphylococcus species DETECTED (A) NOT DETECTED   Staphylococcus aureus (BCID) NOT DETECTED NOT DETECTED   Staphylococcus epidermidis DETECTED (A) NOT DETECTED   Staphylococcus lugdunensis NOT DETECTED NOT DETECTED   Streptococcus species NOT DETECTED NOT DETECTED   Streptococcus agalactiae NOT DETECTED NOT DETECTED   Streptococcus pneumoniae NOT DETECTED NOT DETECTED   Streptococcus pyogenes NOT DETECTED NOT DETECTED   A.calcoaceticus-baumannii NOT DETECTED NOT DETECTED   Bacteroides fragilis NOT DETECTED NOT DETECTED   Enterobacterales NOT DETECTED NOT DETECTED   Enterobacter cloacae complex NOT DETECTED NOT DETECTED   Escherichia coli NOT DETECTED NOT DETECTED   Klebsiella aerogenes NOT DETECTED NOT DETECTED   Klebsiella oxytoca NOT DETECTED NOT DETECTED   Klebsiella pneumoniae NOT DETECTED NOT DETECTED   Proteus species NOT DETECTED NOT DETECTED   Salmonella species NOT DETECTED NOT DETECTED   Serratia marcescens NOT DETECTED NOT DETECTED   Haemophilus influenzae NOT DETECTED NOT DETECTED   Neisseria meningitidis NOT DETECTED NOT DETECTED   Pseudomonas aeruginosa NOT DETECTED NOT  DETECTED   Stenotrophomonas maltophilia NOT DETECTED NOT DETECTED   Candida albicans NOT DETECTED NOT DETECTED   Candida auris NOT DETECTED NOT DETECTED   Candida glabrata NOT DETECTED NOT DETECTED   Candida krusei NOT DETECTED NOT DETECTED   Candida parapsilosis NOT DETECTED NOT DETECTED   Candida tropicalis NOT DETECTED NOT DETECTED   Cryptococcus neoformans/gattii NOT DETECTED NOT DETECTED   Methicillin resistance mecA/C DETECTED (A) NOT DETECTED    Eddie Candle 12/14/2020  5:59 AM

## 2020-12-14 NOTE — Progress Notes (Addendum)
[facility]   I have seen and examined the patient. I have personally reviewed the clinical findings, laboratory findings, microbiological data and imaging studies. The assessment and treatment plan was discussed with the  APP, Terri Piedra.   I agree with their recommendations with the following comments:  Patient had blood cultures drawn yesterday due to concern for new infection.  1 set of cultures drawn from her left hand has GPC's in the anaerobic bottle and the other set drawn from her right hand has GPC's in the aerobic bottle.  BC ID has identified staff epidermidis with mecA/C resistance.  She has not received any antibiotics as a result and remains afebrile with no evidence of leukocytosis and no real changes in her clinical status.  Will follow-up repeat blood cultures to ensure no high-grade bacteremia.  Will also ask micro lab to speciate and run sensitivities on both positive cultures to see if there is any discordance which would give further support for contaminated cultures.  Recommend holding off antibiotics for now in the absence of any clinical changes.   [facility] 12/14/2020, 12:56 PM  I spent greater than 35 minutes with the patient including greater than 50% of time in face to face counsel of the patient and in coordination of their care.   Date of Admission:  09/24/2020     Total days of antibiotics 5         ASSESSMENT:  Ms Brian had an elevated temperature of 99 prompting lab work and drawing of blood cultures now with concern for possible MRSE bacteremia. There is one positive bottle in each set of cultures. Currently she has no clinical or additional lab work that would support this. Suspect this is likely to be a contaminate. Recommend continuing hold antibiotics at this time while awaiting new set of cultures results that were drawn this morning. Will ask  microbiology to speciate the MRSE to determine if they are differing organisms. Continue supportive care as needed by primary team. ID will follow cultures.   PLAN:  Recommend continuing to monitor off antibiotics.  Await new blood culture results drawn this morning Remaining supportive care per primary team. Will follow culture results.  Principal Problem:   Sepsis ([place]) Active Problems:   COPD (chronic obstructive pulmonary disease) (HCC)   Chronic diastolic CHF (congestive heart failure) (HCC)   Acute on chronic respiratory failure with hypoxia (HCC)   GERD (gastroesophageal reflux disease)   Seizures (HCC)   Type 2 diabetes mellitus without complication, with long-term current use of insulin (HCC)   Severe hypoxic-ischemic encephalopathy   Tracheostomy dependence (HCC)   Pressure injury of skin   Difficult airway   Ventilator dependence (HCC)   Acquired tracheal collapse   Anoxic brain injury (HCC)    arformoterol  15 mcg Nebulization BID   baclofen  5 mg Per Tube TID   budesonide  0.5 mg Nebulization BID   chlorhexidine gluconate (MEDLINE KIT)  15 mL Mouth Rinse BID   Chlorhexidine Gluconate Cloth  6 each Topical Daily   clonazePAM  1 mg Per Tube BID   clopidogrel  75 mg Per Tube Daily   enoxaparin (LOVENOX) injection  0.5 mg/kg Subcutaneous Q24H   famotidine  20 mg Per Tube BID   feeding supplement (PROSource TF)  45 mL Per Tube BID   fiber  1 packet Per Tube BID   free water  200  mL Per Tube Q4H   Gerhardt's butt cream   Topical BID   guaiFENesin  5 mL Per Tube Q6H   hydrocortisone cream   Topical TID   insulin aspart  0-15 Units Subcutaneous Q4H   insulin glargine  40 Units Subcutaneous QHS   levETIRAcetam  2,500 mg Per Tube BID   loratadine  10 mg Per Tube Daily   mouth rinse  15 mL Mouth Rinse QID   midodrine  2.5 mg Per NG tube Q8H   montelukast  10 mg Per Tube Daily   nutrition supplement (JUVEN)  1 packet Per Tube BID   revefenacin  175 mcg Nebulization  Daily   tiZANidine  2 mg Per Tube QHS   valproic acid  1,000 mg Per Tube Q12H    SUBJECTIVE:  Ms. Bassinger was last seen by Dr. 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 On 12/13/20 there was concern for low grade fever of 99 and blood cultures and lab work were drawn as precaution to rule out sepsis. WBC count was 8.6 and lactic acid 1.9. Blood cultures were positive for MRSE in 1 bottle out of each set of cultures. ID has been asked for antibiotic recommendations.   Allergies  Allergen Reactions   Contrast Media [Iodinated Diagnostic Agents] Anaphylaxis and Other (See Comments)    ** CARDIAC ARREST **   Peanuts [Peanut Oil] Shortness Of Breath and Swelling   Shellfish Allergy Shortness Of Breath and Swelling   Soap Shortness Of Breath and Swelling    LAUNDRY DETERGENT   Latex Hives   Adenosine    Eggs Or Egg-Derived Products Rash     Review of Systems: Review of Systems  Unable to perform ROS: Medical condition     OBJECTIVE: Vitals:   12/14/20 0800 12/14/20 0900 12/14/20 1043 12/14/20 1114  BP: (!) 158/87 139/67    Pulse: 97 89 83   Resp: 20 16 16    Temp:    98 F (36.7 C)  TempSrc:    Axillary  SpO2: 97% 99% 100%   Weight:      Height:       Body mass index is 35.44 kg/m.  Physical Exam Constitutional:      General: She is not in acute distress.    Appearance: She is well-developed. She is obese.     Comments: Lying in bed with head of bed elevated; not responsive.   Cardiovascular:     Rate and Rhythm: Regular rhythm. Tachycardia present.     Heart sounds: Normal heart sounds.  Pulmonary:     Effort: Pulmonary effort is normal.     Breath sounds: Normal breath sounds.  Skin:    General: Skin is warm and dry.    Lab Results Lab Results  Component Value Date   WBC 8.6 12/13/2020   HGB 10.1 (L)  12/13/2020   HCT 31.9 (L) 12/13/2020   MCV 87.4 12/13/2020   PLT 211 12/13/2020    Lab Results  Component Value Date   CREATININE 0.41 (L) 12/13/2020   BUN 20 12/13/2020   NA 137 12/13/2020   K 3.8 12/13/2020   CL 104 12/13/2020   CO2 26 12/13/2020    Lab Results  Component Value Date   ALT 14 12/13/2020   AST 16 12/13/2020   ALKPHOS 73 12/13/2020   BILITOT 0.3 12/13/2020  Microbiology: Recent Results (from the past 240 hour(s))  Culture, blood (routine x 2)     Status: None (Preliminary result)   Collection Time: 12/13/20  8:46 AM   Specimen: BLOOD LEFT HAND  Result Value Ref Range Status   Specimen Description BLOOD LEFT HAND  Final   Special Requests   Final    BOTTLES DRAWN AEROBIC AND ANAEROBIC Blood Culture results may not be optimal due to an inadequate volume of blood received in culture bottles   Culture  Setup Time   Final    GRAM POSITIVE COCCI CRITICAL VALUE NOTED.  VALUE IS CONSISTENT WITH PREVIOUSLY REPORTED AND CALLED VALUE. Performed at [facility], [place] [address], [place], [place] [address]    Culture GRAM POSITIVE COCCI  Final   Report Status PENDING  Incomplete  Culture, blood (routine x 2)     Status: None (Preliminary result)   Collection Time: 12/13/20  9:04 AM   Specimen: BLOOD RIGHT HAND  Result Value Ref Range Status   Specimen Description BLOOD RIGHT HAND  Final   Special Requests   Final    BOTTLES DRAWN AEROBIC ONLY Blood Culture results may not be optimal due to an inadequate volume of blood received in culture bottles   Culture  Setup Time   Final    GRAM POSITIVE COCCI AEROBIC BOTTLE ONLY CRITICAL RESULT CALLED TO, READ BACK BY AND VERIFIED WITH: PHARMD GREG ABBOTT 12/14/20@05 :55 BY TW Performed at [facility], [place] [address], [place], [place] [address]    Culture GRAM POSITIVE COCCI  Final   Report Status PENDING  Incomplete  Blood Culture ID Panel (Reflexed)     Status: Abnormal   Collection Time: 12/13/20  9:04  AM  Result Value Ref Range Status   Enterococcus faecalis NOT DETECTED NOT DETECTED Final   Enterococcus Faecium NOT DETECTED NOT DETECTED Final   Listeria monocytogenes NOT DETECTED NOT DETECTED Final   Staphylococcus species DETECTED (A) NOT DETECTED Final    Comment: CRITICAL RESULT CALLED TO, READ BACK BY AND VERIFIED WITH: PHARMD GREG ABBOTT 12/14/20@05 :55 BY TW    Staphylococcus aureus (BCID) NOT DETECTED NOT DETECTED Final   Staphylococcus epidermidis DETECTED (A) NOT DETECTED Final    Comment: Methicillin (oxacillin) resistant coagulase negative staphylococcus. Possible blood culture contaminant (unless isolated from more than one blood culture draw or clinical case suggests pathogenicity). No antibiotic treatment is indicated for blood  culture contaminants. CRITICAL RESULT CALLED TO, READ BACK BY AND VERIFIED WITH: PHARMD GREG ABBOTT 12/14/20@5 :55 BY TW    Staphylococcus lugdunensis NOT DETECTED NOT DETECTED Final   Streptococcus species NOT DETECTED NOT DETECTED Final   Streptococcus agalactiae NOT DETECTED NOT DETECTED Final   Streptococcus pneumoniae NOT DETECTED NOT DETECTED Final   Streptococcus pyogenes NOT DETECTED NOT DETECTED Final   A.calcoaceticus-baumannii NOT DETECTED NOT DETECTED Final   Bacteroides fragilis NOT DETECTED NOT DETECTED Final   Enterobacterales NOT DETECTED NOT DETECTED Final   Enterobacter cloacae complex NOT DETECTED NOT DETECTED Final   Escherichia coli NOT DETECTED NOT DETECTED Final   Klebsiella aerogenes NOT DETECTED NOT DETECTED Final   Klebsiella oxytoca NOT DETECTED NOT DETECTED Final   Klebsiella pneumoniae NOT DETECTED NOT DETECTED Final   Proteus species NOT DETECTED NOT DETECTED Final   Salmonella species NOT DETECTED NOT DETECTED Final   Serratia marcescens NOT DETECTED NOT DETECTED Final   Haemophilus influenzae NOT DETECTED NOT DETECTED Final   Neisseria meningitidis NOT DETECTED NOT DETECTED Final  Pseudomonas aeruginosa NOT  DETECTED NOT DETECTED Final   Stenotrophomonas maltophilia NOT DETECTED NOT DETECTED Final   Candida albicans NOT DETECTED NOT DETECTED Final   Candida auris NOT DETECTED NOT DETECTED Final   Candida glabrata NOT DETECTED NOT DETECTED Final   Candida krusei NOT DETECTED NOT DETECTED Final   Candida parapsilosis NOT DETECTED NOT DETECTED Final   Candida tropicalis NOT DETECTED NOT DETECTED Final   Cryptococcus neoformans/gattii NOT DETECTED NOT DETECTED Final   Methicillin resistance mecA/C DETECTED (A) NOT DETECTED Final    Comment: CRITICAL RESULT CALLED TO, READ BACK BY AND VERIFIED WITH: PHARMD GREG ABBOTT 12/14/20@5 :55 BY TW Performed at [facility] [address], [place], [place] [address]      Terri Piedra, [facility]  12/14/2020  11:22 AM

## 2020-12-14 NOTE — Progress Notes (Signed)
Trach tube change was last done on 10/30/20.  Per CCM, trach tube change is to be done q 12 weeks.

## 2020-12-15 DIAGNOSIS — R7881 Bacteremia: Secondary | ICD-10-CM | POA: Insufficient documentation

## 2020-12-15 DIAGNOSIS — B9562 Methicillin resistant Staphylococcus aureus infection as the cause of diseases classified elsewhere: Secondary | ICD-10-CM | POA: Diagnosis not present

## 2020-12-15 DIAGNOSIS — J398 Other specified diseases of upper respiratory tract: Secondary | ICD-10-CM | POA: Diagnosis not present

## 2020-12-15 DIAGNOSIS — G931 Anoxic brain damage, not elsewhere classified: Secondary | ICD-10-CM | POA: Diagnosis not present

## 2020-12-15 DIAGNOSIS — R569 Unspecified convulsions: Secondary | ICD-10-CM | POA: Diagnosis not present

## 2020-12-15 DIAGNOSIS — J9621 Acute and chronic respiratory failure with hypoxia: Secondary | ICD-10-CM | POA: Diagnosis not present

## 2020-12-15 LAB — GLUCOSE, CAPILLARY
Glucose-Capillary: 100 mg/dL — ABNORMAL HIGH (ref 70–99)
Glucose-Capillary: 113 mg/dL — ABNORMAL HIGH (ref 70–99)
Glucose-Capillary: 120 mg/dL — ABNORMAL HIGH (ref 70–99)
Glucose-Capillary: 128 mg/dL — ABNORMAL HIGH (ref 70–99)
Glucose-Capillary: 133 mg/dL — ABNORMAL HIGH (ref 70–99)
Glucose-Capillary: 166 mg/dL — ABNORMAL HIGH (ref 70–99)

## 2020-12-15 MED ORDER — VANCOMYCIN HCL 1500 MG/300ML IV SOLN
1500.0000 mg | Freq: Once | INTRAVENOUS | Status: DC
  Filled 2020-12-15: qty 300

## 2020-12-15 MED ORDER — VANCOMYCIN HCL 1500 MG/300ML IV SOLN
1500.0000 mg | Freq: Once | INTRAVENOUS | Status: AC
  Administered 2020-12-15: 1500 mg via INTRAVENOUS
  Filled 2020-12-15: qty 300

## 2020-12-15 MED ORDER — VANCOMYCIN HCL IN DEXTROSE 1-5 GM/200ML-% IV SOLN
1000.0000 mg | INTRAVENOUS | Status: DC
Start: 2020-12-16 — End: 2020-12-19
  Administered 2020-12-16 – 2020-12-19 (×4): 1000 mg via INTRAVENOUS
  Filled 2020-12-15 (×5): qty 200

## 2020-12-15 NOTE — Progress Notes (Signed)
  [facility]  Date of Admission:  10/08/2020     Total days of antibiotics 6         ASSESSMENT:  Sonia Drake's repeat cultures drawn on 12/14/20 with 1 set of cultures growing gram positive cocci and would anticipate this to be MRSE. Given multiple cultures with gram positive cocci will start vancomycin. Therapeutic drug monitoring of renal function and vancomycin levels per protocol. Primary Team removing peripheral IV that has been in for 40 days. Will repeat blood cultures tomorrow. Initially will plan for 7 days of treatment pending clearance of blood cultures. Monitor blood cultures for species identification in second set of cultures. Remaining support care per primary team.   PLAN:  Start vancomycin per pharmacy.  Therapeutic drug monitoring of renal function and vancomycin levels per protocol.  Remove PIV. Repeat blood cultures tomorrow afternoon.  Monitor cultures for species identification.  Supportive care per primary team.   Dr. Comer will be available over the weekend for ID related questions/concerns with ID follow up on Monday.   Principal Problem:   Sepsis (HCC) Active Problems:   COPD (chronic obstructive pulmonary disease) (HCC)   Chronic diastolic CHF (congestive heart failure) (HCC)   Acute on chronic respiratory failure with hypoxia (HCC)   GERD (gastroesophageal reflux disease)   Seizures (HCC)   Type 2 diabetes mellitus without complication, with long-term current use of insulin (HCC)   Severe hypoxic-ischemic encephalopathy   Tracheostomy dependence (HCC)   Pressure injury of skin   Difficult airway   Ventilator dependence (HCC)   Acquired tracheal collapse   Anoxic brain injury (HCC)   MRSA bacteremia    arformoterol  15 mcg Nebulization BID   baclofen  5 mg Per Tube TID   budesonide  0.5 mg Nebulization BID   chlorhexidine gluconate (MEDLINE KIT)  15 mL Mouth Rinse BID   Chlorhexidine Gluconate Cloth  6 each Topical  Daily   clonazePAM  1 mg Per Tube BID   clopidogrel  75 mg Per Tube Daily   enoxaparin (LOVENOX) injection  0.5 mg/kg Subcutaneous Q24H   famotidine  20 mg Per Tube BID   feeding supplement (PROSource TF)  45 mL Per Tube BID   fiber  1 packet Per Tube BID   free water  200 mL Per Tube Q4H   Gerhardt's butt cream   Topical BID   guaiFENesin  5 mL Per Tube Q6H   hydrocortisone cream   Topical TID   insulin aspart  0-15 Units Subcutaneous Q4H   insulin glargine  40 Units Subcutaneous QHS   levETIRAcetam  2,500 mg Per Tube BID   loratadine  10 mg Per Tube Daily   mouth rinse  15 mL Mouth Rinse QID   midodrine  2.5 mg Per NG tube Q8H   montelukast  10 mg Per Tube Daily   nutrition supplement (JUVEN)  1 packet Per Tube BID   revefenacin  175 mcg Nebulization Daily   tiZANidine  2 mg Per Tube QHS   valproic acid  1,000 mg Per Tube Q12H    SUBJECTIVE:  Afebrile on ventilator. Not responsive.   Allergies  Allergen Reactions   Contrast Media [Iodinated Diagnostic Agents] Anaphylaxis and Other (See Comments)    ** CARDIAC ARREST **   Peanuts [Peanut Oil] Shortness Of Breath and Swelling   Shellfish Allergy Shortness Of Breath and Swelling   Soap Shortness Of Breath and Swelling    LAUNDRY DETERGENT     Latex Hives   Adenosine    Eggs Or Egg-Derived Products Rash     Review of Systems: Review of Systems  Unable to perform ROS: Medical condition     OBJECTIVE: Vitals:   12/15/20 1000 12/15/20 1100 12/15/20 1119 12/15/20 1150  BP: 115/68 100/65  100/65  Pulse: 99 (!) 103  92  Resp: (!) 23 (!) 22  (!) 22  Temp:   98.4 F (36.9 C)   TempSrc:   Oral   SpO2: 98% 96%  98%  Weight:      Height:       Body mass index is 35.44 kg/m.  Physical Exam Constitutional:      General: She is not in acute distress.    Appearance: She is well-developed.     Comments: Lying in bed with head of bed elevated; not responsive.   Cardiovascular:     Rate and Rhythm: Regular rhythm.  Tachycardia present.     Heart sounds: Normal heart sounds.  Pulmonary:     Effort: Pulmonary effort is normal.     Breath sounds: Normal breath sounds.  Skin:    General: Skin is warm and dry.    Lab Results Lab Results  Component Value Date   WBC 8.6 12/13/2020   HGB 10.1 (L) 12/13/2020   HCT 31.9 (L) 12/13/2020   MCV 87.4 12/13/2020   PLT 211 12/13/2020    Lab Results  Component Value Date   CREATININE 0.41 (L) 12/13/2020   BUN 20 12/13/2020   NA 137 12/13/2020   K 3.8 12/13/2020   CL 104 12/13/2020   CO2 26 12/13/2020    Lab Results  Component Value Date   ALT 14 12/13/2020   AST 16 12/13/2020   ALKPHOS 73 12/13/2020   BILITOT 0.3 12/13/2020     Microbiology: Recent Results (from the past 240 hour(s))  Culture, blood (routine x 2)     Status: None (Preliminary result)   Collection Time: 12/13/20  8:46 AM   Specimen: BLOOD LEFT HAND  Result Value Ref Range Status   Specimen Description BLOOD LEFT HAND  Final   Special Requests   Final    BOTTLES DRAWN AEROBIC AND ANAEROBIC Blood Culture results may not be optimal due to an inadequate volume of blood received in culture bottles   Culture  Setup Time   Final    GRAM POSITIVE COCCI CRITICAL VALUE NOTED.  VALUE IS CONSISTENT WITH PREVIOUSLY REPORTED AND CALLED VALUE. ANAEROBIC BOTTLE ONLY    Culture   Final    GRAM POSITIVE COCCI IDENTIFICATION TO FOLLOW Performed at [facility], [address] [place], [place] [address]    Report Status PENDING  Incomplete  Culture, blood (routine x 2)     Status: Abnormal (Preliminary result)   Collection Time: 12/13/20  9:04 AM   Specimen: BLOOD RIGHT HAND  Result Value Ref Range Status   Specimen Description BLOOD RIGHT HAND  Final   Special Requests   Final    BOTTLES DRAWN AEROBIC ONLY Blood Culture results may not be optimal due to an inadequate volume of blood received in culture bottles   Culture  Setup Time   Final    GRAM POSITIVE COCCI AEROBIC BOTTLE  ONLY CRITICAL RESULT CALLED TO, READ BACK BY AND VERIFIED WITH: PHARMD GREG ABBOTT 12/14/20@05:55 BY TW    Culture (A)  Final    STAPHYLOCOCCUS CAPITIS CULTURE REINCUBATED FOR BETTER GROWTH Performed at [facility], [address] [place], [place] [address]    Report Status PENDING  Incomplete  Blood Culture ID Panel (Reflexed)     Status: Abnormal   Collection Time: 12/13/20  9:04 AM  Result Value Ref Range Status   Enterococcus faecalis NOT DETECTED NOT DETECTED Final   Enterococcus Faecium NOT DETECTED NOT DETECTED Final   Listeria monocytogenes NOT DETECTED NOT DETECTED Final   Staphylococcus species DETECTED (A) NOT DETECTED Final    Comment: CRITICAL RESULT CALLED TO, READ BACK BY AND VERIFIED WITH: PHARMD GREG ABBOTT 12/14/20@05:55 BY TW    Staphylococcus aureus (BCID) NOT DETECTED NOT DETECTED Final   Staphylococcus epidermidis DETECTED (A) NOT DETECTED Final    Comment: Methicillin (oxacillin) resistant coagulase negative staphylococcus. Possible blood culture contaminant (unless isolated from more than one blood culture draw or clinical case suggests pathogenicity). No antibiotic treatment is indicated for blood  culture contaminants. CRITICAL RESULT CALLED TO, READ BACK BY AND VERIFIED WITH: PHARMD GREG ABBOTT 12/14/20@5:55 BY TW    Staphylococcus lugdunensis NOT DETECTED NOT DETECTED Final   Streptococcus species NOT DETECTED NOT DETECTED Final   Streptococcus agalactiae NOT DETECTED NOT DETECTED Final   Streptococcus pneumoniae NOT DETECTED NOT DETECTED Final   Streptococcus pyogenes NOT DETECTED NOT DETECTED Final   A.calcoaceticus-baumannii NOT DETECTED NOT DETECTED Final   Bacteroides fragilis NOT DETECTED NOT DETECTED Final   Enterobacterales NOT DETECTED NOT DETECTED Final   Enterobacter cloacae complex NOT DETECTED NOT DETECTED Final   Escherichia coli NOT DETECTED NOT DETECTED Final   Klebsiella aerogenes NOT DETECTED NOT DETECTED Final   Klebsiella  oxytoca NOT DETECTED NOT DETECTED Final   Klebsiella pneumoniae NOT DETECTED NOT DETECTED Final   Proteus species NOT DETECTED NOT DETECTED Final   Salmonella species NOT DETECTED NOT DETECTED Final   Serratia marcescens NOT DETECTED NOT DETECTED Final   Haemophilus influenzae NOT DETECTED NOT DETECTED Final   Neisseria meningitidis NOT DETECTED NOT DETECTED Final   Pseudomonas aeruginosa NOT DETECTED NOT DETECTED Final   Stenotrophomonas maltophilia NOT DETECTED NOT DETECTED Final   Candida albicans NOT DETECTED NOT DETECTED Final   Candida auris NOT DETECTED NOT DETECTED Final   Candida glabrata NOT DETECTED NOT DETECTED Final   Candida krusei NOT DETECTED NOT DETECTED Final   Candida parapsilosis NOT DETECTED NOT DETECTED Final   Candida tropicalis NOT DETECTED NOT DETECTED Final   Cryptococcus neoformans/gattii NOT DETECTED NOT DETECTED Final   Methicillin resistance mecA/C DETECTED (A) NOT DETECTED Final    Comment: CRITICAL RESULT CALLED TO, READ BACK BY AND VERIFIED WITH: PHARMD GREG ABBOTT 12/14/20@5:55 BY TW Performed at [facility], [address] [place], [place] [address]   Urine Culture     Status: Abnormal (Preliminary result)   Collection Time: 12/13/20  5:27 PM   Specimen: Urine, Catheterized  Result Value Ref Range Status   Specimen Description URINE, CATHETERIZED  Final   Special Requests NONE  Final   Culture (A)  Final    >=100,000 COLONIES/mL ENTEROCOCCUS FAECALIS CULTURE REINCUBATED FOR BETTER GROWTH SUSCEPTIBILITIES TO FOLLOW Performed at [facility], [address] [place],  [address]    Report Status PENDING  Incomplete  Culture, blood (Routine X 2) w Reflex to ID Panel     Status: None (Preliminary result)   Collection Time: 12/14/20  7:57 AM   Specimen: BLOOD LEFT HAND  Result Value Ref Range Status   Specimen Description BLOOD LEFT HAND  Final   Special Requests   Final      BOTTLES DRAWN AEROBIC AND ANAEROBIC Blood Culture  adequate volume   Culture  Setup Time   Final    GRAM POSITIVE COCCI IN CLUSTERS IN BOTH AEROBIC AND ANAEROBIC BOTTLES CRITICAL VALUE NOTED.  VALUE IS CONSISTENT WITH PREVIOUSLY REPORTED AND CALLED VALUE. Performed at [facility], [place] [address] [place], [place] [address]    Culture GRAM POSITIVE COCCI  Final   Report Status PENDING  Incomplete  Culture, blood (Routine X 2) w Reflex to ID Panel     Status: None (Preliminary result)   Collection Time: 12/14/20  8:04 AM   Specimen: BLOOD RIGHT HAND  Result Value Ref Range Status   Specimen Description BLOOD RIGHT HAND  Final   Special Requests   Final    BOTTLES DRAWN AEROBIC AND ANAEROBIC Blood Culture adequate volume   Culture   Final    NO GROWTH < 24 HOURS Performed at [facility], [place] [address], [place], [place] [address]    Report Status PENDING  Incomplete     Terri Piedra, [facility]  12/15/2020  12:03 PM

## 2020-12-15 NOTE — Progress Notes (Addendum)
[facility] PROGRESS NOTE  Sonia Drake ZOX:[id number] DOB: 22-Nov-1955 DOA: 10/02/2020 PCP: Sonia Brothers, MD  Status: Remains inpatient appropriate because:Altered mental status, Unsafe d/c plan, and Inpatient level of care appropriate due to severity of illness  Dispo: The patient is from: SNF              Anticipated d/c is to: SN[facility] [place] but due to the fact she has an adjustable trach and given her recurrent airway issues as described above they have declined to take her this particular facility.                Patient currently is not medically stable to d/c.   Difficult to place patient Yes   Level of care: ICU  Code Status: Full-pulmonary medicine documents paperwork that presents some conflicting information about the patient's wishes Family Communication: Spoke with patient's daughter in detail on 8/17.  8/26 updated daughter on new diagnosis of MRSE bacteremia. DVT prophylaxis: Lovenox COVID vaccination status: Per H&P patient is not vaccinated  **8/22 extensive conversations held with both patient's [name] as well as patient's niece [name].  Please see separate documentation regarding these conversations**  The following pertains to patient's living will dated June 22nd, 2016: 1) no sedative medications to keep her asleep.  She wants to be fully awake regardless of pain 2) she requested all means to keep her alive.  She wants to remain on life support until her daughter, [name] and the grandchildren decide otherwise after consulting with the medical team 3) she also hand wrote that she wishes to be allowed 82 to 90 days to see if she can pull through  HPI: 65 y/o female with medical history significant of asthma/COPD, CHF, cardiac arrest with PEA/CPR with chronic respiratory failure on permanent trach with supplemental oxygen at 6L, type 2 DM, GERD, seizures and severe hypoxic-ischemic encephalopathy who  presented to ER via EMS for respiratory distress from SNF.  She presented with findings consistent with acute aspiration pneumonitis.  She decompensated after arrival and required transfer to ICU and initiation of mechanical ventilation.  She had sepsis physiology presumed secondary to [facility] bacteremia but it was later determined that the blood culture results were related to contaminants and patient was no longer determined to be septic   She had previously been ventilator dependent but had been weaned from the ventilator briefly during her stay. On 6/16 a bronchoscopy revealed dynamic collapse of her trachea, tracheostomy was changed from 8.0 XLT cuffless to 6.0 XLT cuffed. She now is requiring chronic ventilatory support 2/2 advanced COPD/asthma and tracheobronchomalacia. PCCM physicians updated patient's daughter on status and poor prognosis along with recommendation for chronic vent.  Palliative medicine has had multiple discussions with the daughter.  Daughter continues to request aggressive care.  Unfortunately she continues to have multiple daily events regarding tracheal collapse with mucous plugging requiring excessive lavage and suction as well as bag-valve-mask for ventilation  Patient has had reemergence of continuous seizure activity.  Initially detected by noting patient with rhythmic blinking.  Continuous EEG revealed ongoing seizure activity.  On 8/23 during exam with sternal rub patient had what appeared to be decerebrate posturing, eyes were opened and noted with sluggish response to pupils.  Not long after eyes were opened for examination patient began having forceful, rhythmic spasms of the bilateral face that appeared consistent with seizure activity.  I returned to her room 1 hour later and her eyes were  open and she was blinking constantly.  She had no blink reflex when tested.  She had another facial twitch that appeared consistent with seizure activity.  RN at bedside and witnessed  these findings.  Significant Hospital events: 6/12 admitted from skilled nursing facility after aspiration event and copious secretions per tracheostomy 6/13 appears comfortable currently on 35% ATC. No distress. WBC ct trending down. No events overnight growing GPC clusters in two of two cultures  6/16 increased work of breathing, upper airway noise, abdominal muscle use.  Bronchoscopy performed as trach exchange cuffless #8 Shiley XLT showed dynamic airway collapse, tracheal collapse. 6/16 continued respiratory distress moved to ICU, changed to cuffed #6 Shiley XLT, placed on MV with some improved comfort 6/12 cefepime > 6/14, 6/18 6/12 vanc > 6/14, 6/18 6/23 mucus plugging, severe vent dyssynchrony 6/24-25 intermittent jerking, seizures on EEG 6/27 weaned off propofol gtt 6/28 briefly tolerated trach collar in AM 6/29 tolerated trach collar in AM; no further seizure like activity; working with CM/SW for vent-SNF 7/1 Weaning on CPAP/ PS 10/5, CM SW working on [employer], NO seizure activity, secretions are not an issue per nursing 7/11 trach changed to #8 Bivona with air cuff, much improved. 7/12 TCT 12 hours; rested on Vent overnight 7/13 on TCT 7/23 Trach collar tolerated 7/24 Full vent support 7/26>> Remains on full vent support this am, no issues with secretions per nursing 8/5 occluding trach with dynamic collapse: seen on bronchoscopy 8/6 called back emergently to bedside for tracheostomy occlusion: emergent bronch, bivona trach migrated back 2cm, improved with advancing to 2cm above carina 8/8 collapse caused hypoxia. Improved with repositioning  8/9 afternoon had another episode of tracheal collapse and occlusion of trach requiring BVM, sedation. Family produced paperwork which seems to have conflicting information about pts wishes  8/11: pt continues to have multiple daily episodes of tracheal collapse/mucous plugging req lavage, BVM. Repeated discussions with family re: code status  esp in light of paperwork  8/15 consultation placed with ethics committee to evaluate regarding futility of care 8/17 spoke at length with patient's daughter regarding futility of care CODE STATUS.  Daughter continues to desire aggressive measures.  Subjective: At baseline mentation.  Today I was unable to obtain any response to noxious painful stimuli applied to nailbeds.  With opening eyes patient did have mild focal seizure activity as evidenced by facial twitching and repetitive eye blinking.   Objective: Vitals:   12/15/20 0515 12/15/20 0600  BP: (!) 177/108 (!) 117/101  Pulse: (!) 111 93  Resp: 17 15  Temp:    SpO2: 99% 99%    Intake/Output Summary (Last 24 hours) at 12/15/2020 0739 Last data filed at 12/15/2020 0545 Gross per 24 hour  Intake 4605 ml  Output 2175 ml  Net 2430 ml   Filed Weights   12/09/20 2341 12/10/20 0426 12/11/20 0400  Weight: 88.9 kg 88.9 kg 87.9 kg    Exam:  Constitutional: Unresponsive, no respiratory distress detected Respiratory: 8.0 cuffed Bivona air trach, ventilator settings: PRVC mode,VT 400, Rate 16, PEEP 5, PS 15, FiO2 30%; Lungs remain clear and coarse.  On spontaneous respiration Cardiovascular: sinus rhythm /sinus tach, stable peripheral edema, normotensive although unconfirmed BP demonstrates SBP down to 70 overnight. Abdomen: Nondistended with normoactive bowel sounds. PEG tube for feedings. LBM 8/25 Skin: Stage II decubitus consistent with several mild areas of excoriation without any periwound erythema. Neurologic: Remains unresponsive.  No response to painful stimulus when applied to nailbeds bilaterally.  Able to precipitate focal  seizure activity with opening eyes.  Pupils: Right 2 mm and left 4 mm with very sluggish reaction Psychiatric: Eyes closed.  Unresponsive    Assessment/Plan: Acute problems: Acute on chronic respiratory failure w/ hypoxemia Tracheostomy/vent dependence 2/2 Tracheobronchomalcia and advanced  COPD/asthma Not a candidate to wean from ventilator or decannulation secondary to altered mental status as well as continued issues with tracheal collapse requiring adjustable trach PCCM physician spoke at length with daughter about chronicity of these medical problems and that patient will always be ventilator dependent.  Daughter opted to continue current aggressive care.   I also spoke at length with daughter on 8/17 and she wishes to continue aggressive care  Patient continues to have issues with intermittent breath stacking as well as tracheal collapse.  Most times when given Ativan develops reflexive hypotension. (See below)  Known anoxic brain injury after cardiac arrest  Patient without any meaningful improvement.  Currently she continues to have underlying seizure activity as evidenced by rhythmic eye blinking and focal twitching in face and eyes. 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 Prognosis extremely poor and at this juncture it appears we are approaching futility of care.  Palliative medicine has reached out to the family on multiple occasions but they continue to wish for aggressive care.  Appreciate assistance of ethics team regarding futility of care concerns.  Discussed with palliative care team and given multiple interactions with patient's family at this juncture they feel they have nothing to offer and agree with ethics committee recommendations for pursuing futility of care.  Patient's family would like to continue care at least to the 90-day mark which will be in 16 days from today.  Seizure disorder Present prior to her cardiac arrest is worsened since her anoxic brain injury Continue Keppra, clonazepam (increased to 1 mg BID on 8/22) and  Depakote Episodic issues related to status epilepticus requiring formal neurological consultation Patient with repetitive blinking--EEG with run of polyspikes on EEG. likely due to h/o anoxic injury. (per Dr Hortense Ramal) LTM obtained oon 8/19-20 showed continued seizure spikes, attending physician over the weekend spoke with Dr. Hortense Ramal on 8/21 -Per neurologist: "Of note, patient has been in persistent vegetative state and I do not believe that treating the seizures will lead to any improvement in patient's outcome.  I would favor not aggressively treating them with antiepileptic medications as this would likely lead to sedation"  History of cardiac arrest Review of outpatient cardiology documents from 09/08/2019 revealed no evidence of CAD Patient was treated for cardiac arrest 2/2 acute hypoxia in August 2021 at [facility].  According to EMS records during that admission patient called 911 in context of respiratory failure.  She was gasping for air and then became unresponsive.  Upon EMS arrival patient was pulseless and apneic.  MRSE bacteremia -T-max 99 prompting infectious work-up -Blood cultures obtained in 2/2 positive for gram-positive cocci with 1 set with staph epidermis that is methicillin-resistant based on [facility] ID-repeat blood cultures also growing gram-positive cocci -ID has initiated vancomycin as of 8/26-plan is for 7 days of therapy pending clearance of organism from blood culture. 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 this a.m. SBP 130 so have changed order to hold dose if SBP >/= 130  History of CVA Continue Plavix   Hyponatremia Resolved after holding free water current sodium 139 Since initiation of free water flushes sodium has remained stable with current reading of 137  Diabetes mellitus 2 on long-term insulin with hyperglycemia Continue Lantus and SSI Hemoglobin A1c 7.0 Current CBGs well-controlled   Hypertension Blood pressure times has been suboptimal therefore beta-blocker discontinued.   Chronic diastolic dysfunction Echocardiogram this admission with grade 1 diastolic dysfunction, moderate LV hypertrophy hyperdynamic LV systolic function Currently euvolemic Monitor I/O Continue as needed beta-blocker for elevated heart rate -SBP remains suboptimal Not on ACE or ARB secondary to suboptimal blood pressure reading   Chronic protein malnutrition secondary to sequelae from anoxic brain injury/PEG tube dependence Continue current tube feedings with Prosource Nutrition Problem: Increased nutrient needs Etiology: wound healing Signs/Symptoms: estimated needs Interventions: Tube feeding Body mass index is 35.44 kg/m.   Anemia of chronic disease Hemoglobin remained stable around 10  Stage II sacral decubitus Wound / Incision (Open or Dehisced) 10/31/20 (MASD) Moisture Associated Skin Damage Buttocks Bilateral (Active)  Date First Assessed/Time First Assessed: 10/31/20 2100   Wound Type: (MASD) Moisture Associated Skin Damage  Location: Buttocks  Location Orientation: Bilateral  Present on Admission: No    Assessments 10/31/2020  9:20 PM 12/14/2020  8:00 AM  Dressing Type None;Other (Comment) --  Site / Wound Assessment Pink --  Treatment Cleansed Other (Comment)     No Linked orders to display       Data Reviewed: Basic Metabolic Panel: Recent Labs  Lab 12/13/20 0846  NA 137  K 3.8  CL 104  CO2 26  GLUCOSE  117*  BUN 20  CREATININE 0.41*  CALCIUM 10.1   Liver Function Tests: Recent Labs  Lab 12/13/20 0846  AST 16  ALT 14  ALKPHOS 73  BILITOT 0.3  PROT 5.7*  ALBUMIN 2.4*     No results for input(s): LIPASE, AMYLASE in the last 168 hours. No results for input(s): AMMONIA in the last 168 hours. CBC: Recent Labs  Lab 12/13/20 0846  WBC 8.6  NEUTROABS 5.2  HGB 10.1*  HCT 31.9*  MCV 87.4  PLT 211   Cardiac Enzymes: No results for input(s): CKTOTAL, CKMB, CKMBINDEX, TROPONINI in the last 168 hours. BNP (last 3 results) Recent Labs    10/04/20 0051 10/05/20 0049 10/06/20 0548  BNP 145.8* 115.3* 125.4*    ProBNP (last 3 results) No results for input(s): PROBNP in the last 8760 hours.  CBG: Recent Labs  Lab 12/14/20 1112 12/14/20 1520 12/14/20 1944 12/14/20 2326 12/15/20 0341  GLUCAP 103* 108* 132* 149* 100*    Recent Results (from the past 240 hour(s))  Culture, blood (routine x 2)     Status: None (Preliminary result)   Collection Time: 12/13/20  8:46 AM   Specimen: BLOOD LEFT HAND  Result Value Ref Range Status   Specimen Description BLOOD LEFT HAND  Final   Special Requests   Final    BOTTLES DRAWN AEROBIC AND ANAEROBIC Blood Culture results may not be optimal due to an inadequate volume of blood received in culture bottles   Culture  Setup Time   Final    GRAM POSITIVE COCCI CRITICAL VALUE NOTED.  VALUE IS CONSISTENT WITH PREVIOUSLY REPORTED AND CALLED VALUE. ANAEROBIC BOTTLE ONLY Performed at [facility] [facility], [place] [address] [place], [place] [address]    Culture GRAM POSITIVE COCCI  Final   Report Status PENDING  Incomplete  Culture, blood (routine x 2)     Status: None (Preliminary result)   Collection Time: 12/13/20  9:04 AM   Specimen: BLOOD RIGHT HAND  Result Value Ref Range Status   Specimen Description BLOOD RIGHT HAND  Final   Special Requests   Final    BOTTLES DRAWN AEROBIC ONLY Blood Culture results may not be optimal due to an  inadequate volume of blood received in culture bottles   Culture  Setup Time   Final    GRAM POSITIVE COCCI AEROBIC BOTTLE ONLY CRITICAL RESULT CALLED TO, READ BACK BY AND VERIFIED WITH: PHARMD GREG ABBOTT 12/14/20_0 :55 BY TW Performed at [facility], [place] [address], [place],  [address]    Culture GRAM POSITIVE COCCI  Final   Report Status PENDING  Incomplete  Blood Culture ID Panel (Reflexed)     Status: Abnormal   Collection Time: 12/13/20  9:04 AM  Result Value Ref Range Status   Enterococcus faecalis NOT DETECTED NOT DETECTED Final   Enterococcus Faecium NOT DETECTED NOT DETECTED Final   Listeria monocytogenes NOT DETECTED NOT DETECTED Final   Staphylococcus species DETECTED (A) NOT DETECTED Final    Comment: CRITICAL RESULT CALLED TO, READ BACK BY AND VERIFIED WITH: PHARMD GREG ABBOTT 12/14/20_1 :55 BY TW    Staphylococcus aureus (BCID) NOT DETECTED NOT DETECTED Final   Staphylococcus epidermidis DETECTED (A) NOT DETECTED Final    Comment: Methicillin (oxacillin) resistant coagulase negative staphylococcus. Possible blood culture contaminant (unless isolated from more than one blood culture draw or clinical case suggests pathogenicity). No antibiotic treatment is indicated for blood  culture contaminants. CRITICAL RESULT CALLED TO, READ BACK BY AND VERIFIED WITH: PHARMD GREG ABBOTT 12/14/20_2 :55 BY TW    Staphylococcus lugdunensis NOT DETECTED NOT DETECTED Final   Streptococcus species NOT DETECTED NOT DETECTED Final   Streptococcus agalactiae NOT DETECTED NOT DETECTED Final   Streptococcus pneumoniae NOT DETECTED NOT DETECTED Final   Streptococcus pyogenes NOT DETECTED NOT DETECTED Final   A.calcoaceticus-baumannii NOT DETECTED NOT DETECTED Final   Bacteroides fragilis NOT DETECTED NOT DETECTED Final   Enterobacterales NOT DETECTED NOT DETECTED Final   Enterobacter cloacae complex NOT DETECTED NOT DETECTED Final   Escherichia coli NOT DETECTED NOT DETECTED Final    Klebsiella aerogenes NOT DETECTED NOT DETECTED Final   Klebsiella oxytoca NOT DETECTED NOT DETECTED Final   Klebsiella pneumoniae NOT DETECTED NOT DETECTED Final   Proteus species NOT DETECTED NOT DETECTED Final   Salmonella species NOT DETECTED NOT DETECTED Final   Serratia marcescens NOT DETECTED NOT DETECTED Final   Haemophilus influenzae NOT DETECTED NOT DETECTED Final   Neisseria meningitidis NOT DETECTED NOT DETECTED Final   Pseudomonas aeruginosa NOT DETECTED NOT DETECTED Final   Stenotrophomonas maltophilia NOT DETECTED NOT DETECTED Final   Candida albicans NOT DETECTED NOT DETECTED Final   Candida auris NOT DETECTED NOT DETECTED Final   Candida glabrata NOT DETECTED NOT DETECTED Final   Candida krusei NOT DETECTED NOT DETECTED Final   Candida parapsilosis NOT DETECTED NOT DETECTED Final   Candida tropicalis NOT DETECTED NOT DETECTED Final   Cryptococcus neoformans/gattii NOT DETECTED NOT DETECTED Final   Methicillin resistance mecA/C  DETECTED (A) NOT DETECTED Final    Comment: CRITICAL RESULT CALLED TO, READ BACK BY AND VERIFIED WITH: PHARMD GREG ABBOTT 12/14/20_0 :55 BY TW Performed at [facility], [place] [address], [place], [place] [address]   Urine Culture     Status: None (Preliminary result)   Collection Time: 12/13/20  5:27 PM   Specimen: Urine, Catheterized  Result Value Ref Range Status   Specimen Description URINE, CATHETERIZED  Final   Special Requests NONE  Final   Culture   Final    CULTURE REINCUBATED FOR BETTER GROWTH Performed at [facility], [place] [address] [place], [place] [address]    Report Status PENDING  Incomplete      Studies: No results found.  Scheduled Meds:  arformoterol  15 mcg Nebulization BID   baclofen  5 mg Per Tube TID   budesonide  0.5 mg Nebulization BID   chlorhexidine gluconate (MEDLINE KIT)  15 mL Mouth Rinse BID   Chlorhexidine Gluconate Cloth  6 each Topical Daily   clonazePAM  1 mg Per Tube BID   clopidogrel   75 mg Per Tube Daily   enoxaparin (LOVENOX) injection  0.5 mg/kg Subcutaneous Q24H   famotidine  20 mg Per Tube BID   feeding supplement (PROSource TF)  45 mL Per Tube BID   fiber  1 packet Per Tube BID   free water  200 mL Per Tube Q4H   Gerhardt's butt cream   Topical BID   guaiFENesin  5 mL Per Tube Q6H   hydrocortisone cream   Topical TID   insulin aspart  0-15 Units Subcutaneous Q4H   insulin glargine  40 Units Subcutaneous QHS   levETIRAcetam  2,500 mg Per Tube BID   loratadine  10 mg Per Tube Daily   mouth rinse  15 mL Mouth Rinse QID   midodrine  2.5 mg Per NG tube Q8H   montelukast  10 mg Per Tube Daily   nutrition supplement (JUVEN)  1 packet Per Tube BID   revefenacin  175 mcg Nebulization Daily   tiZANidine  2 mg Per Tube QHS   valproic acid  1,000 mg Per Tube Q12H   Continuous Infusions:  sodium chloride     feeding supplement (JEVITY 1.5 CAL/FIBER) 1,000 mL (12/13/20 1846)    Principal Problem:   Sepsis ([place]) Active Problems:   COPD (chronic obstructive pulmonary disease) (HCC)   Chronic diastolic CHF (congestive heart failure) (HCC)   Acute on chronic respiratory failure with hypoxia (HCC)   GERD (gastroesophageal reflux disease)   Seizures (HCC)   Type 2 diabetes mellitus without complication, with long-term current use of insulin (HCC)   Severe hypoxic-ischemic encephalopathy   Tracheostomy dependence ([place])   Pressure injury of skin   Difficult airway   Ventilator dependence ([place])   Acquired tracheal collapse   Anoxic brain injury [facility])   Consultants: PCCM Palliative medicine Neurology  Procedures: Echocardiogram Replacement of trach  Antibiotics: Cefepime 6/12 through 6/14 Flagyl 6/12 through 6/14 Vancomycin 6/12 through 6/14 Cefepime x1 dose 6/18 Vancomycin x2 doses 6/18, 6/19   Time spent: 45 minutes    Erin Hearing ANP  [facility] 7 am - 330 pm/M-F for direct patient care and secure chat Please refer to Amion for  contact info 75  days

## 2020-12-15 NOTE — Progress Notes (Signed)
Pharmacy Antibiotic Note  Sonia Drake is a 65 y.o. female admitted on Oct 10, 2020 with bacteremia.  Pharmacy has been consulted for Vancomycin dosing.  Blood cultures ID panel on 8/24 showed S. Epidermitis with MecA gene for methicillin resistance. It was believed to be a contaminant, so blood cultures were redrawn on 8/25, showing growth  in 1/2 samples. Per ID recommendations, will proceed  with vancomycin treatment.  Plan: Load with 1500 mg of vancomycin x 1 Start 1000 mg vancomycin every 24 hours on 8/27 to provide estimated AUC of 457 using SCr of 0.8 Vancomycin peak/trough when appropriate  Height: 5\' 2"  (157.5 cm) Weight: 87.9 kg (193 lb 12.6 oz) IBW/kg (Calculated) : 50.1  Temp (24hrs), Avg:98.3 F (36.8 C), Min:97.6 F (36.4 C), Max:98.9 F (37.2 C)  Recent Labs  Lab 12/13/20 0846  WBC 8.6  CREATININE 0.41*  LATICACIDVEN 1.9    Estimated Creatinine Clearance: 71.2 mL/min (A) (by C-G formula based on SCr of 0.41 mg/dL (L)).    Allergies  Allergen Reactions   Contrast Media [Iodinated Diagnostic Agents] Anaphylaxis and Other (See Comments)    ** CARDIAC ARREST **   Peanuts [Peanut Oil] Shortness Of Breath and Swelling   Shellfish Allergy Shortness Of Breath and Swelling   Soap Shortness Of Breath and Swelling    LAUNDRY DETERGENT   Latex Hives   Adenosine    Eggs Or Egg-Derived Products Rash    Antimicrobials this admission: Cefepime 6/12 >>6/18 Metronidazole 6/12 >> 6/15 Vancomycin 6/13>>6/20   Microbiology results: 8/25 BCx: 1/2 G+ cocci 8/24 Bcx: Staphylococcus epidermitis 8/24 UCx: pending   Thank you for allowing pharmacy to participate in this patient's care.  9/24, PharmD PGY1 Pharmacy Resident 12/15/2020 10:38 AM Check [email or web address] for unit specific pharmacy number   12/17/2020 12/15/2020 10:20 AM

## 2020-12-15 NOTE — Progress Notes (Addendum)
   Palliative Medicine Inpatient Follow Up Note   Palliative care is familiar with Rosaly Labarbera. Fehl as per prior meetings with her daughter, [name] in June 2022. Per these conversations [name] has been clear about full code / full scope of treatment.   The Palliative medicine team is in full agreement with the Ethics consultation which had been pursued on 8/17 and concur with their thoughts.   Unfortunately, presently the PMT is unable to offer anything additional.   Provided the primary medical team these updates.  We will sign off.   No Charge ______________________________________________________________________________________ Lamarr Lulas [facility] Team Team Cell Phone: [phone] Please utilize secure chat with additional questions, if there is no response within 30 minutes please call the above phone number  Palliative Medicine Team providers are available by phone from 7am to 7pm daily and can be reached through the team cell phone.  Should this patient require assistance outside of these hours, please call the patient's attending physician.

## 2020-12-16 DIAGNOSIS — A419 Sepsis, unspecified organism: Secondary | ICD-10-CM | POA: Diagnosis not present

## 2020-12-16 LAB — GLUCOSE, CAPILLARY
Glucose-Capillary: 109 mg/dL — ABNORMAL HIGH (ref 70–99)
Glucose-Capillary: 140 mg/dL — ABNORMAL HIGH (ref 70–99)
Glucose-Capillary: 127 mg/dL — ABNORMAL HIGH (ref 70–99)
Glucose-Capillary: 96 mg/dL (ref 70–99)
Glucose-Capillary: 145 mg/dL — ABNORMAL HIGH (ref 70–99)
Glucose-Capillary: 124 mg/dL — ABNORMAL HIGH (ref 70–99)

## 2020-12-16 MED ORDER — LACTATED RINGERS IV BOLUS
1000.0000 mL | Freq: Once | INTRAVENOUS | Status: AC
  Administered 2020-12-16: 1000 mL via INTRAVENOUS

## 2020-12-16 NOTE — Progress Notes (Signed)
   12/16/20 0815  Tracheostomy Bivona Aire-Cuf 8 mm Cuffed  Placement Date/Time: 10/30/20 1551   Placed By: Self  Brand: Bivona Aire-Cuf  Size (mm): 8 mm  Style: Cuffed  Status Secured  Site Assessment Clean  Site Care Dressing applied  Ties Assessment Secure  Cuff Pressure (cm H2O) Green OR 18-26 CmH2O  Tracheostomy Equipment at bedside Yes and checklist posted at head of bed  Adult Ventilator Settings  Vent Type Servo U  Humidity HME  Vent Mode PRVC  Vt Set 400 mL  Set Rate 16 bmp  FiO2 (%) 30 %  I Time 0.9 Sec(s)  PEEP 5 cmH20  Adult Ventilator Measurements  Peak Airway Pressure 19 L/min  Mean Airway Pressure 8 cmH20  Plateau Pressure 16 cmH20  Resp Rate Spontaneous 0 br/min  Resp Rate Total 16 br/min  Exhaled Vt 398 mL  Measured Ve 5.6 mL  I:E Ratio Measured 1:3.1  Auto PEEP 0 cmH20  Total PEEP 5 cmH20  SpO2 98 %  Adult Ventilator Alarms  Alarms On Y  Ve High Alarm 20 L/min  Ve Low Alarm 4 L/min  Resp Rate High Alarm 38 br/min  Resp Rate Low Alarm 10  PEEP Low Alarm 3 cmH2O  Press High Alarm 50 cmH2O  Daily Weaning Assessment  Daily Assessment of Readiness to Wean Wean protocol criteria not met  Reason not met Apnea  Breath Sounds  Bilateral Breath Sounds Diminished  Airway Suctioning/Secretions  Suction Type Tracheal  Suction Device  Catheter  Secretion Amount Small  Secretion Color White  Secretion Consistency Thick  Suction Tolerance Tolerated well  Suctioning Adverse Effects None

## 2020-12-16 NOTE — Plan of Care (Signed)
  Problem: Clinical Measurements: Goal: Will remain free from infection Outcome: Not Progressing   Problem: Activity: Goal: Risk for activity intolerance will decrease Outcome: Not Progressing   Problem: Skin Integrity: Goal: Risk for impaired skin integrity will decrease Outcome: Not Progressing   

## 2020-12-16 NOTE — Progress Notes (Signed)
PROGRESS NOTE    Sonia Drake  MWN:[id number] DOB: [birth year] DOA: 10/02/2020 PCP: Garwin Brothers, MD   Chief Complain: Brief Narrative: 65 year old female with history of asthma/COPD, CHF, cardiac arrest with PEA/CPR with chronic respiratory failure on chronic trach on supplemental oxygen on 6 L, type 2 diabetes, GERD, seizures, severe hypoxic/ischemic encephalopathy who presented for respiratory distress from skilled nursing facility.  Findings were suggestive of aspiration pneumonitis on presentation.  She decompensated after arrival and required transfer to ICU and initiation of mechanical ventilation.  Hospital course remarkable for persistent vegetative state.  EEG also showed ongoing seizure activity suspected to be from anoxic injury to the brain.  Multiple discussion held with family for goals of care/futility of treatment.  Palliative care, PCCM, ethics committee was consulted.  Still remains full code, family not ready for comfort care.  Assessment & Plan:   Principal Problem:   Sepsis ([place]) Active Problems:   COPD (chronic obstructive pulmonary disease) (HCC)   Chronic diastolic CHF (congestive heart failure) (HCC)   Acute on chronic respiratory failure with hypoxia (HCC)   GERD (gastroesophageal reflux disease)   Seizures (HCC)   Type 2 diabetes mellitus without complication, with long-term current use of insulin (HCC)   Severe hypoxic-ischemic encephalopathy   Tracheostomy dependence (HCC)   Pressure injury of skin   Difficult airway   Ventilator dependence ([place])   Acquired tracheal collapse   Anoxic brain injury ([place])   MRSA bacteremia   Acute on chronic respiratory failure with hypoxemia/tracheostomy/vent dependent/tracheomalacia/advanced COPD/asthma: Remains vent dependent.  Hospital course also remarkable for finding of tracheal collapse requiring adjustable trach.  PCCM following  Anoxic brain injury after cardiac arrest: No meaningful recovery.  Comatose.  Neurology  was also following.  Continue low-dose baclofen, Zanaflex.  Has extreme poor quality of life/poor prognosis  Seizure: Neurology was following.  EEG showed seizure activity.  Currently on Keppra, clonazepam, Depakote.  Neurology thinks that this seizure is from anoxic brain injury and did not recommend changing AED because they are not going to help.  Bacteremia/sepsis: Blood cultures sent on 8/24 showed Staph capitis on both sides, currently on vancomycin.  Urine culture had showed Enterococcus faecalis.  ID also following.  Follow-up blood cultures sent on 8/25 showing staph capitis again on 1 bottle only.. Continue vancomycin, started on 8/26, plan for 7 days of therapy Peripheral IV line removed, knee replaced  Intermittent hypotension: Most likely secondary to dysautonomia from brain injury.  Receives intermittent fluid boluses.  On midodrine.  History of CVA: On Plavix.  Currently on vegetative state  Diabetes type 2: Monitor blood sugars.  Hemoglobin A1c of 7  Chronic diastolic congestive heart failure: Currently euvolemic.  Echo had shown grade 1 diastolic dysfunction, hyperdynamic left ventricular systolic function with EF of 70 to 75%  Anemia of chronic disease: Hemoglobin stable around 10        Nutrition Problem: Increased nutrient needs Etiology: wound healing      DVT prophylaxis:Lovenox Code Status: Full Family Communication: [name] at bedside Status is: Inpatient  Remains inpatient appropriate because:Inpatient level of care appropriate due to severity of illness  Dispo: The patient is from: SNF              Anticipated d/c is to: Unknown              Patient currently is not medically stable to d/c.   Difficult to place patient Yes    Consultants: Palliative care,PCCM    Antimicrobials:  Anti-infectives (  From admission, onward)    Start     Dose/Rate Route Frequency Ordered Stop   12/16/20 1000  vancomycin (VANCOCIN) IVPB 1000 mg/200 mL premix         1,000 mg 200 mL/hr over 60 Minutes Intravenous Every 24 hours 12/15/20 1017 12/22/20 0959   12/15/20 1200  vancomycin (VANCOREADY) IVPB 1500 mg/300 mL        1,500 mg 150 mL/hr over 120 Minutes Intravenous  Once 12/15/20 1045 12/15/20 1407   12/15/20 1115  vancomycin (VANCOREADY) IVPB 1500 mg/300 mL  Status:  Discontinued        1,500 mg 150 mL/hr over 120 Minutes Intravenous  Once 12/15/20 1017 12/15/20 1045   10/07/20 1400  vancomycin (VANCOREADY) IVPB 1500 mg/300 mL  Status:  Discontinued        1,500 mg 150 mL/hr over 120 Minutes Intravenous Every 24 hours 10/07/20 1223 10/09/20 0829   10/07/20 1200  ceFEPIme (MAXIPIME) 2 g in sodium chloride 0.9 % 100 mL IVPB  Status:  Discontinued        2 g 200 mL/hr over 30 Minutes Intravenous Every 8 hours 10/07/20 0828 10/07/20 1221   10/02/20 1400  vancomycin (VANCOREADY) IVPB 1250 mg/250 mL  Status:  Discontinued        1,250 mg 166.7 mL/hr over 90 Minutes Intravenous Every 24 hours 09/25/2020 1313 10/04/20 1221   09/30/2020 2200  ceFEPIme (MAXIPIME) 2 g in sodium chloride 0.9 % 100 mL IVPB  Status:  Discontinued        2 g 200 mL/hr over 30 Minutes Intravenous Every 8 hours 09/27/2020 1313 10/04/20 1221   10/18/2020 2200  metroNIDAZOLE (FLAGYL) IVPB 500 mg  Status:  Discontinued        500 mg 100 mL/hr over 60 Minutes Intravenous Every 8 hours 09/28/2020 1616 10/04/20 1221   10/02/2020 1330  vancomycin (VANCOREADY) IVPB 2000 mg/400 mL        2,000 mg 200 mL/hr over 120 Minutes Intravenous  Once 09/22/2020 1257 09/30/2020 1550   09/30/2020 1300  ceFEPIme (MAXIPIME) 2 g in sodium chloride 0.9 % 100 mL IVPB        2 g 200 mL/hr over 30 Minutes Intravenous  Once 10/11/2020 1257 10/16/2020 1330   09/24/2020 1300  metroNIDAZOLE (FLAGYL) IVPB 500 mg        500 mg 100 mL/hr over 60 Minutes Intravenous  Once 10/05/2020 1257 10/06/2020 1443       Subjective:  Patient seen and examined at the bedside this morning.  Not in distress.  On vent.  Comatose.  Response by  moving only with painful stimuli   Objective: Vitals:   12/16/20 1100 12/16/20 1131 12/16/20 1200 12/16/20 1300  BP: (!) 150/87  (!) 161/95 (!) 76/40  Pulse: 87  (!) 116 76  Resp: (!) 23  (!) 31 16  Temp:  97.9 F (36.6 C)    TempSrc:  Axillary    SpO2: 99%  98% 99%  Weight:      Height:        Intake/Output Summary (Last 24 hours) at 12/16/2020 1405 Last data filed at 12/16/2020 1300 Gross per 24 hour  Intake 3913.57 ml  Output 1600 ml  Net 2313.57 ml   Filed Weights   12/09/20 2341 12/10/20 0426 12/11/20 0400  Weight: 88.9 kg 88.9 kg 87.9 kg    Examination:  General exam: Comatose, on vent, unresponsive HEENT: Eyes closed Respiratory system: Trach, no obvious wheezes or crackles Cardiovascular system: S1 &  S2 heard, RRR.  Gastrointestinal system: Abdomen is nondistended, soft and nontender.  PEG Central nervous system: Not alert or awake Extremities: No edema, no clubbing ,no cyanosis Skin: No rashes, no icterus      Data Reviewed: I have personally reviewed following labs and imaging studies  CBC: Recent Labs  Lab 12/13/20 0846  WBC 8.6  NEUTROABS 5.2  HGB 10.1*  HCT 31.9*  MCV 87.4  PLT 975   Basic Metabolic Panel: Recent Labs  Lab 12/13/20 0846  NA 137  K 3.8  CL 104  CO2 26  GLUCOSE 117*  BUN 20  CREATININE 0.41*  CALCIUM 10.1   GFR: Estimated Creatinine Clearance: 71.2 mL/min (A) (by C-G formula based on SCr of 0.41 mg/dL (L)). Liver Function Tests: Recent Labs  Lab 12/13/20 0846  AST 16  ALT 14  ALKPHOS 73  BILITOT 0.3  PROT 5.7*  ALBUMIN 2.4*   No results for input(s): LIPASE, AMYLASE in the last 168 hours. No results for input(s): AMMONIA in the last 168 hours. Coagulation Profile: No results for input(s): INR, PROTIME in the last 168 hours. Cardiac Enzymes: No results for input(s): CKTOTAL, CKMB, CKMBINDEX, TROPONINI in the last 168 hours. BNP (last 3 results) No results for input(s): PROBNP in the last 8760  hours. HbA1C: No results for input(s): HGBA1C in the last 72 hours. CBG: Recent Labs  Lab 12/15/20 1929 12/15/20 2336 12/16/20 0305 12/16/20 0735 12/16/20 1114  GLUCAP 166* 113* 109* 140* 127*   Lipid Profile: No results for input(s): CHOL, HDL, LDLCALC, TRIG, CHOLHDL, LDLDIRECT in the last 72 hours. Thyroid Function Tests: No results for input(s): TSH, T4TOTAL, FREET4, T3FREE, THYROIDAB in the last 72 hours. Anemia Panel: No results for input(s): VITAMINB12, FOLATE, FERRITIN, TIBC, IRON, RETICCTPCT in the last 72 hours. Sepsis Labs: Recent Labs  Lab 12/13/20 0846  LATICACIDVEN 1.9    Recent Results (from the past 240 hour(s))  Culture, blood (routine x 2)     Status: Abnormal (Preliminary result)   Collection Time: 12/13/20  8:46 AM   Specimen: BLOOD LEFT HAND  Result Value Ref Range Status   Specimen Description BLOOD LEFT HAND  Final   Special Requests   Final    BOTTLES DRAWN AEROBIC AND ANAEROBIC Blood Culture results may not be optimal due to an inadequate volume of blood received in culture bottles   Culture  Setup Time   Final    GRAM POSITIVE COCCI CRITICAL VALUE NOTED.  VALUE IS CONSISTENT WITH PREVIOUSLY REPORTED AND CALLED VALUE. ANAEROBIC BOTTLE ONLY    Culture (A)  Final    STAPHYLOCOCCUS CAPITIS STAPHYLOCOCCUS EPIDERMIDIS SUSCEPTIBILITIES TO FOLLOW Performed at [facility], [place] [address] [place], [place] [address]    Report Status PENDING  Incomplete  Culture, blood (routine x 2)     Status: Abnormal (Preliminary result)   Collection Time: 12/13/20  9:04 AM   Specimen: BLOOD RIGHT HAND  Result Value Ref Range Status   Specimen Description BLOOD RIGHT HAND  Final   Special Requests   Final    BOTTLES DRAWN AEROBIC ONLY Blood Culture results may not be optimal due to an inadequate volume of blood received in culture bottles   Culture  Setup Time   Final    GRAM POSITIVE COCCI AEROBIC BOTTLE ONLY CRITICAL RESULT CALLED TO, READ BACK BY AND  VERIFIED WITH: PHARMD GREG ABBOTT 12/14/20@05 :55 BY TW    Culture (A)  Final    STAPHYLOCOCCUS CAPITIS CULTURE REINCUBATED FOR BETTER  GROWTH Performed at [facility], [place] [address], [place], [place] [address]    Report Status PENDING  Incomplete  Blood Culture ID Panel (Reflexed)     Status: Abnormal   Collection Time: 12/13/20  9:04 AM  Result Value Ref Range Status   Enterococcus faecalis NOT DETECTED NOT DETECTED Final   Enterococcus Faecium NOT DETECTED NOT DETECTED Final   Listeria monocytogenes NOT DETECTED NOT DETECTED Final   Staphylococcus species DETECTED (A) NOT DETECTED Final    Comment: CRITICAL RESULT CALLED TO, READ BACK BY AND VERIFIED WITH: PHARMD GREG ABBOTT 12/14/20@05 :55 BY TW    Staphylococcus aureus (BCID) NOT DETECTED NOT DETECTED Final   Staphylococcus epidermidis DETECTED (A) NOT DETECTED Final    Comment: Methicillin (oxacillin) resistant coagulase negative staphylococcus. Possible blood culture contaminant (unless isolated from more than one blood culture draw or clinical case suggests pathogenicity). No antibiotic treatment is indicated for blood  culture contaminants. CRITICAL RESULT CALLED TO, READ BACK BY AND VERIFIED WITH: PHARMD GREG ABBOTT 12/14/20@5 :55 BY TW    Staphylococcus lugdunensis NOT DETECTED NOT DETECTED Final   Streptococcus species NOT DETECTED NOT DETECTED Final   Streptococcus agalactiae NOT DETECTED NOT DETECTED Final   Streptococcus pneumoniae NOT DETECTED NOT DETECTED Final   Streptococcus pyogenes NOT DETECTED NOT DETECTED Final   A.calcoaceticus-baumannii NOT DETECTED NOT DETECTED Final   Bacteroides fragilis NOT DETECTED NOT DETECTED Final   Enterobacterales NOT DETECTED NOT DETECTED Final   Enterobacter cloacae complex NOT DETECTED NOT DETECTED Final   Escherichia coli NOT DETECTED NOT DETECTED Final   Klebsiella aerogenes NOT DETECTED NOT DETECTED Final   Klebsiella oxytoca NOT DETECTED NOT DETECTED Final   Klebsiella  pneumoniae NOT DETECTED NOT DETECTED Final   Proteus species NOT DETECTED NOT DETECTED Final   Salmonella species NOT DETECTED NOT DETECTED Final   Serratia marcescens NOT DETECTED NOT DETECTED Final   Haemophilus influenzae NOT DETECTED NOT DETECTED Final   Neisseria meningitidis NOT DETECTED NOT DETECTED Final   Pseudomonas aeruginosa NOT DETECTED NOT DETECTED Final   Stenotrophomonas maltophilia NOT DETECTED NOT DETECTED Final   Candida albicans NOT DETECTED NOT DETECTED Final   Candida auris NOT DETECTED NOT DETECTED Final   Candida glabrata NOT DETECTED NOT DETECTED Final   Candida krusei NOT DETECTED NOT DETECTED Final   Candida parapsilosis NOT DETECTED NOT DETECTED Final   Candida tropicalis NOT DETECTED NOT DETECTED Final   Cryptococcus neoformans/gattii NOT DETECTED NOT DETECTED Final   Methicillin resistance mecA/C DETECTED (A) NOT DETECTED Final    Comment: CRITICAL RESULT CALLED TO, READ BACK BY AND VERIFIED WITH: PHARMD GREG ABBOTT 12/14/20@5 :55 BY TW Performed at [facility], [address], [place], [place] [address]   Urine Culture     Status: Abnormal (Preliminary result)   Collection Time: 12/13/20  5:27 PM   Specimen: Urine, Catheterized  Result Value Ref Range Status   Specimen Description URINE, CATHETERIZED  Final   Special Requests NONE  Final   Culture (A)  Final    >=100,000 COLONIES/mL ENTEROCOCCUS FAECALIS 30,000 COLONIES/mL KLEBSIELLA PNEUMONIAE REPEATING SUSCEPTIBILITY FOR ENTEROCOCCUS FAECALIS Performed at [facility], [place] [address], [place], [place] [address]    Report Status PENDING  Incomplete   Organism ID, Bacteria KLEBSIELLA PNEUMONIAE (A)  Final      Susceptibility   Klebsiella pneumoniae - MIC*    AMPICILLIN >=32 RESISTANT Resistant     CEFAZOLIN <=4 SENSITIVE Sensitive     CEFEPIME <=0.12 SENSITIVE Sensitive  CEFTRIAXONE <=0.25 SENSITIVE Sensitive     CIPROFLOXACIN <=0.25 SENSITIVE Sensitive     GENTAMICIN <=1 SENSITIVE  Sensitive     IMIPENEM <=0.25 SENSITIVE Sensitive     NITROFURANTOIN 128 RESISTANT Resistant     TRIMETH/SULFA <=20 SENSITIVE Sensitive     AMPICILLIN/SULBACTAM 4 SENSITIVE Sensitive     PIP/TAZO <=4 SENSITIVE Sensitive     * 30,000 COLONIES/mL KLEBSIELLA PNEUMONIAE  Culture, blood (Routine X 2) w Reflex to ID Panel     Status: Abnormal (Preliminary result)   Collection Time: 12/14/20  7:57 AM   Specimen: BLOOD LEFT HAND  Result Value Ref Range Status   Specimen Description BLOOD LEFT HAND  Final   Special Requests   Final    BOTTLES DRAWN AEROBIC AND ANAEROBIC Blood Culture adequate volume   Culture  Setup Time   Final    GRAM POSITIVE COCCI IN CLUSTERS IN BOTH AEROBIC AND ANAEROBIC BOTTLES CRITICAL VALUE NOTED.  VALUE IS CONSISTENT WITH PREVIOUSLY REPORTED AND CALLED VALUE.    Culture (A)  Final    STAPHYLOCOCCUS CAPITIS CULTURE REINCUBATED FOR BETTER GROWTH Performed at [facility], [place] [address], [place], [place] [address]    Report Status PENDING  Incomplete  Culture, blood (Routine X 2) w Reflex to ID Panel     Status: None (Preliminary result)   Collection Time: 12/14/20  8:04 AM   Specimen: BLOOD RIGHT HAND  Result Value Ref Range Status   Specimen Description BLOOD RIGHT HAND  Final   Special Requests   Final    BOTTLES DRAWN AEROBIC AND ANAEROBIC Blood Culture adequate volume   Culture   Final    NO GROWTH 2 DAYS Performed at [facility], [place] [address], [place], [place] [address]    Report Status PENDING  Incomplete         Radiology Studies: No results found.      Scheduled Meds:  arformoterol  15 mcg Nebulization BID   baclofen  5 mg Per Tube TID   budesonide  0.5 mg Nebulization BID   chlorhexidine gluconate (MEDLINE KIT)  15 mL Mouth Rinse BID   Chlorhexidine Gluconate Cloth  6 each Topical Daily   clonazePAM  1 mg Per Tube BID   clopidogrel  75 mg Per Tube Daily   enoxaparin (LOVENOX) injection  0.5 mg/kg Subcutaneous Q24H    famotidine  20 mg Per Tube BID   feeding supplement (PROSource TF)  45 mL Per Tube BID   fiber  1 packet Per Tube BID   free water  200 mL Per Tube Q4H   Gerhardt's butt cream   Topical BID   guaiFENesin  5 mL Per Tube Q6H   hydrocortisone cream   Topical TID   insulin aspart  0-15 Units Subcutaneous Q4H   insulin glargine  40 Units Subcutaneous QHS   levETIRAcetam  2,500 mg Per Tube BID   loratadine  10 mg Per Tube Daily   mouth rinse  15 mL Mouth Rinse QID   midodrine  2.5 mg Per NG tube Q8H   montelukast  10 mg Per Tube Daily   nutrition supplement (JUVEN)  1 packet Per Tube BID   revefenacin  175 mcg Nebulization Daily   tiZANidine  2 mg Per Tube QHS   valproic acid  1,000 mg Per Tube Q12H   Continuous Infusions:  sodium chloride     feeding supplement (JEVITY 1.5 CAL/FIBER) 1,000 mL (12/15/20 2300)   vancomycin Stopped (  12/16/20 1130)     LOS: 76 days    Time spent: 35 mins,More than 50% of that time was spent in counseling and/or coordination of care.      Shelly Coss, MD [facility] P8/27/2022, 2:05 PM

## 2020-12-16 NOTE — Progress Notes (Signed)
Cross-coverage note:   Notified of SBP in 70s despite midodrine. Other vitals stable/normal, no fever. Plan to give IVF bolus, continue close monitoring.

## 2020-12-17 DIAGNOSIS — A419 Sepsis, unspecified organism: Secondary | ICD-10-CM | POA: Diagnosis not present

## 2020-12-17 LAB — CBC WITH DIFFERENTIAL/PLATELET
Abs Immature Granulocytes: 0.02 10*3/uL (ref 0.00–0.07)
Basophils Absolute: 0 10*3/uL (ref 0.0–0.1)
Basophils Relative: 1 %
Eosinophils Absolute: 0.3 10*3/uL (ref 0.0–0.5)
Eosinophils Relative: 5 %
HCT: 30.9 % — ABNORMAL LOW (ref 36.0–46.0)
Hemoglobin: 9.5 g/dL — ABNORMAL LOW (ref 12.0–15.0)
Immature Granulocytes: 0 %
Lymphocytes Relative: 31 %
Lymphs Abs: 1.9 10*3/uL (ref 0.7–4.0)
MCH: 27.1 pg (ref 26.0–34.0)
MCHC: 30.7 g/dL (ref 30.0–36.0)
MCV: 88.3 fL (ref 80.0–100.0)
Monocytes Absolute: 0.6 10*3/uL (ref 0.1–1.0)
Monocytes Relative: 10 %
Neutro Abs: 3.4 10*3/uL (ref 1.7–7.7)
Neutrophils Relative %: 53 %
Platelets: 206 10*3/uL (ref 150–400)
RBC: 3.5 MIL/uL — ABNORMAL LOW (ref 3.87–5.11)
RDW: 17.2 % — ABNORMAL HIGH (ref 11.5–15.5)
WBC: 6.2 10*3/uL (ref 4.0–10.5)
nRBC: 0 % (ref 0.0–0.2)

## 2020-12-17 LAB — BASIC METABOLIC PANEL
Anion gap: 7 (ref 5–15)
BUN: 22 mg/dL (ref 8–23)
CO2: 27 mmol/L (ref 22–32)
Calcium: 10.1 mg/dL (ref 8.9–10.3)
Chloride: 104 mmol/L (ref 98–111)
Creatinine, Ser: 0.37 mg/dL — ABNORMAL LOW (ref 0.44–1.00)
GFR, Estimated: >60 mL/min (ref >60)
Glucose, Bld: 110 mg/dL — ABNORMAL HIGH (ref 70–99)
Potassium: 3.8 mmol/L (ref 3.5–5.1)
Sodium: 138 mmol/L (ref 135–145)

## 2020-12-17 LAB — GLUCOSE, CAPILLARY
Glucose-Capillary: 108 mg/dL — ABNORMAL HIGH (ref 70–99)
Glucose-Capillary: 119 mg/dL — ABNORMAL HIGH (ref 70–99)
Glucose-Capillary: 123 mg/dL — ABNORMAL HIGH (ref 70–99)
Glucose-Capillary: 123 mg/dL — ABNORMAL HIGH (ref 70–99)
Glucose-Capillary: 136 mg/dL — ABNORMAL HIGH (ref 70–99)
Glucose-Capillary: 90 mg/dL (ref 70–99)

## 2020-12-17 LAB — URINE CULTURE: Culture: >=100000 — AB

## 2020-12-17 NOTE — Progress Notes (Signed)
PROGRESS NOTE    Sonia Drake  MWN:[id number] DOB: [birth year] DOA: 10/02/2020 PCP: Garwin Brothers, MD   Chief Complain: Brief Narrative: 65 year old female with history of asthma/COPD, CHF, cardiac arrest with PEA/CPR with chronic respiratory failure on chronic trach on supplemental oxygen on 6 L, type 2 diabetes, GERD, seizures, severe hypoxic/ischemic encephalopathy who presented for respiratory distress from skilled nursing facility.  Findings were suggestive of aspiration pneumonitis on presentation.  She decompensated after arrival and required transfer to ICU and initiation of mechanical ventilation.  Hospital course remarkable for persistent vegetative state.  EEG also showed ongoing seizure activity suspected to be from anoxic injury to the brain.  Multiple discussion held with family for goals of care/futility of treatment.  Palliative care, PCCM, ethics committee was consulted.  Still remains full code, family not ready for comfort care.  Assessment & Plan:   Principal Problem:   Sepsis ([place]) Active Problems:   COPD (chronic obstructive pulmonary disease) (HCC)   Chronic diastolic CHF (congestive heart failure) (HCC)   Acute on chronic respiratory failure with hypoxia (HCC)   GERD (gastroesophageal reflux disease)   Seizures (HCC)   Type 2 diabetes mellitus without complication, with long-term current use of insulin (HCC)   Severe hypoxic-ischemic encephalopathy   Tracheostomy dependence (HCC)   Pressure injury of skin   Difficult airway   Ventilator dependence ([place])   Acquired tracheal collapse   Anoxic brain injury ([place])   MRSA bacteremia   Acute on chronic respiratory failure with hypoxemia/tracheostomy/vent dependent/tracheomalacia/advanced COPD/asthma: Remains vent dependent.  Hospital course also remarkable for finding of tracheal collapse requiring adjustable trach.  PCCM following  Anoxic brain injury after cardiac arrest: No meaningful recovery.  Comatose.  Neurology  was also following.  Continue low-dose baclofen, Zanaflex.  Has extreme poor quality of life/poor prognosis  Seizure: Neurology was following.  EEG showed seizure activity.  Currently on Keppra, clonazepam, Depakote.  Neurology thinks that this seizure is from anoxic brain injury and did not recommend changing AED because they are not going to help.  Bacteremia/sepsis: Blood cultures sent on 8/24 showed Staph capitis on both sides, currently on vancomycin.  Urine culture had showed Enterococcus faecalis.  ID also following.  Follow-up blood cultures sent on 8/25 showing staph capitis again on 1 bottle only.. Continue vancomycin, started on 8/26, plan for 7 days of therapy Peripheral IV line removed, knee replaced  Intermittent hypotension: Most likely secondary to dysautonomia from brain injury.  Receives intermittent fluid boluses.  On midodrine.  History of CVA: On Plavix.  Currently on vegetative state  Diabetes type 2: Monitor blood sugars.  Hemoglobin A1c of 7  Chronic diastolic congestive heart failure: Currently euvolemic.  Echo had shown grade 1 diastolic dysfunction, hyperdynamic left ventricular systolic function with EF of 70 to 75%  Anemia of chronic disease: Hemoglobin stable around 10        Nutrition Problem: Increased nutrient needs Etiology: wound healing      DVT prophylaxis:Lovenox Code Status: Full Family Communication: [name] at bedside Status is: Inpatient  Remains inpatient appropriate because:Inpatient level of care appropriate due to severity of illness  Dispo: The patient is from: SNF              Anticipated d/c is to: Unknown              Patient currently is not medically stable to d/c.   Difficult to place patient Yes    Consultants: Palliative care,PCCM    Antimicrobials:  Anti-infectives (  From admission, onward)    Start     Dose/Rate Route Frequency Ordered Stop   12/16/20 1000  vancomycin (VANCOCIN) IVPB 1000 mg/200 mL premix         1,000 mg 200 mL/hr over 60 Minutes Intravenous Every 24 hours 12/15/20 1017 12/22/20 0959   12/15/20 1200  vancomycin (VANCOREADY) IVPB 1500 mg/300 mL        1,500 mg 150 mL/hr over 120 Minutes Intravenous  Once 12/15/20 1045 12/15/20 1407   12/15/20 1115  vancomycin (VANCOREADY) IVPB 1500 mg/300 mL  Status:  Discontinued        1,500 mg 150 mL/hr over 120 Minutes Intravenous  Once 12/15/20 1017 12/15/20 1045   10/07/20 1400  vancomycin (VANCOREADY) IVPB 1500 mg/300 mL  Status:  Discontinued        1,500 mg 150 mL/hr over 120 Minutes Intravenous Every 24 hours 10/07/20 1223 10/09/20 0829   10/07/20 1200  ceFEPIme (MAXIPIME) 2 g in sodium chloride 0.9 % 100 mL IVPB  Status:  Discontinued        2 g 200 mL/hr over 30 Minutes Intravenous Every 8 hours 10/07/20 0828 10/07/20 1221   10/02/20 1400  vancomycin (VANCOREADY) IVPB 1250 mg/250 mL  Status:  Discontinued        1,250 mg 166.7 mL/hr over 90 Minutes Intravenous Every 24 hours 09/27/2020 1313 10/04/20 1221   10/12/2020 2200  ceFEPIme (MAXIPIME) 2 g in sodium chloride 0.9 % 100 mL IVPB  Status:  Discontinued        2 g 200 mL/hr over 30 Minutes Intravenous Every 8 hours 10/17/2020 1313 10/04/20 1221   09/21/2020 2200  metroNIDAZOLE (FLAGYL) IVPB 500 mg  Status:  Discontinued        500 mg 100 mL/hr over 60 Minutes Intravenous Every 8 hours 10/06/2020 1616 10/04/20 1221   09/26/2020 1330  vancomycin (VANCOREADY) IVPB 2000 mg/400 mL        2,000 mg 200 mL/hr over 120 Minutes Intravenous  Once 10/05/2020 1257 10/04/2020 1550   10/06/2020 1300  ceFEPIme (MAXIPIME) 2 g in sodium chloride 0.9 % 100 mL IVPB        2 g 200 mL/hr over 30 Minutes Intravenous  Once 10/11/2020 1257 10/17/2020 1330   09/29/2020 1300  metroNIDAZOLE (FLAGYL) IVPB 500 mg        500 mg 100 mL/hr over 60 Minutes Intravenous  Once 09/30/2020 1257 10/02/2020 1443       Subjective:  Patient seen and examined at the bedside this morning.  Same as yesterday.  No new changes.  Blood pressure has  improved.  Remains comatose   Objective: Vitals:   12/17/20 0611 12/17/20 0700 12/17/20 0725 12/17/20 0746  BP: 116/77 (!) 86/44 (!) 96/40   Pulse: 72 60 63   Resp: _0 Temp:    97.7 F (36.5 C)  TempSrc:    Axillary  SpO2: 100% 98% 98%   Weight:      Height:        Intake/Output Summary (Last 24 hours) at 12/17/2020 0759 Last data filed at 12/17/2020 0700 Gross per 24 hour  Intake 3194.87 ml  Output 1150 ml  Net 2044.87 ml   Filed Weights   12/10/20 0426 12/11/20 0400 12/17/20 0337  Weight: 88.9 kg 87.9 kg 90.7 kg    Examination:  General exam: Comatose, on vent, unresponsive HEENT: Eyes closed Respiratory system: Trach, no obvious wheezes or crackles Cardiovascular system: S1 & S2 heard, RRR.  Gastrointestinal system: Abdomen is nondistended, soft and nontender.  PEG Central nervous system: Not alert or awake Extremities: No edema, no clubbing ,no cyanosis Skin: No rashes, no icterus      Data Reviewed: I have personally reviewed following labs and imaging studies  CBC: Recent Labs  Lab 12/13/20 0846 12/17/20 0159  WBC 8.6 6.2  NEUTROABS 5.2 3.4  HGB 10.1* 9.5*  HCT 31.9* 30.9*  MCV 87.4 88.3  PLT 211 832   Basic Metabolic Panel: Recent Labs  Lab 12/13/20 0846 12/17/20 0159  NA 137 138  K 3.8 3.8  CL 104 104  CO2 26 27  GLUCOSE 117* 110*  BUN 20 22  CREATININE 0.41* 0.37*  CALCIUM 10.1 10.1   GFR: Estimated Creatinine Clearance: 72.4 mL/min (A) (by C-G formula based on SCr of 0.37 mg/dL (L)). Liver Function Tests: Recent Labs  Lab 12/13/20 0846  AST 16  ALT 14  ALKPHOS 73  BILITOT 0.3  PROT 5.7*  ALBUMIN 2.4*   No results for input(s): LIPASE, AMYLASE in the last 168 hours. No results for input(s): AMMONIA in the last 168 hours. Coagulation Profile: No results for input(s): INR, PROTIME in the last 168 hours. Cardiac Enzymes: No results for input(s): CKTOTAL, CKMB, CKMBINDEX, TROPONINI in the last 168 hours. BNP (last 3  results) No results for input(s): PROBNP in the last 8760 hours. HbA1C: No results for input(s): HGBA1C in the last 72 hours. CBG: Recent Labs  Lab 12/16/20 1533 12/16/20 2003 12/16/20 2307 12/17/20 0334 12/17/20 0738  GLUCAP 96 145* 124* 90 123*   Lipid Profile: No results for input(s): CHOL, HDL, LDLCALC, TRIG, CHOLHDL, LDLDIRECT in the last 72 hours. Thyroid Function Tests: No results for input(s): TSH, T4TOTAL, FREET4, T3FREE, THYROIDAB in the last 72 hours. Anemia Panel: No results for input(s): VITAMINB12, FOLATE, FERRITIN, TIBC, IRON, RETICCTPCT in the last 72 hours. Sepsis Labs: Recent Labs  Lab 12/13/20 0846  LATICACIDVEN 1.9    Recent Results (from the past 240 hour(s))  Culture, blood (routine x 2)     Status: Abnormal (Preliminary result)   Collection Time: 12/13/20  8:46 AM   Specimen: BLOOD LEFT HAND  Result Value Ref Range Status   Specimen Description BLOOD LEFT HAND  Final   Special Requests   Final    BOTTLES DRAWN AEROBIC AND ANAEROBIC Blood Culture results may not be optimal due to an inadequate volume of blood received in culture bottles   Culture  Setup Time   Final    GRAM POSITIVE COCCI CRITICAL VALUE NOTED.  VALUE IS CONSISTENT WITH PREVIOUSLY REPORTED AND CALLED VALUE. ANAEROBIC BOTTLE ONLY    Culture (A)  Final    STAPHYLOCOCCUS CAPITIS STAPHYLOCOCCUS EPIDERMIDIS SUSCEPTIBILITIES TO FOLLOW Performed at [facility], [place] [address], [place], [place] [address]    Report Status PENDING  Incomplete  Culture, blood (routine x 2)     Status: Abnormal (Preliminary result)   Collection Time: 12/13/20  9:04 AM   Specimen: BLOOD RIGHT HAND  Result Value Ref Range Status   Specimen Description BLOOD RIGHT HAND  Final   Special Requests   Final    BOTTLES DRAWN AEROBIC ONLY Blood Culture results may not be optimal due to an inadequate volume of blood received in culture bottles   Culture  Setup Time   Final    GRAM POSITIVE COCCI AEROBIC  BOTTLE ONLY CRITICAL RESULT CALLED TO, READ BACK BY AND VERIFIED WITH: PHARMD GREG ABBOTT 12/14/20_0 :55 BY TW  Culture (A)  Final    STAPHYLOCOCCUS CAPITIS CULTURE REINCUBATED FOR BETTER GROWTH Performed at [facility], [place] [address] [place], [place] [address]    Report Status PENDING  Incomplete  Blood Culture ID Panel (Reflexed)     Status: Abnormal   Collection Time: 12/13/20  9:04 AM  Result Value Ref Range Status   Enterococcus faecalis NOT DETECTED NOT DETECTED Final   Enterococcus Faecium NOT DETECTED NOT DETECTED Final   Listeria monocytogenes NOT DETECTED NOT DETECTED Final   Staphylococcus species DETECTED (A) NOT DETECTED Final    Comment: CRITICAL RESULT CALLED TO, READ BACK BY AND VERIFIED WITH: PHARMD GREG ABBOTT 12/14/20_0 :55 BY TW    Staphylococcus aureus (BCID) NOT DETECTED NOT DETECTED Final   Staphylococcus epidermidis DETECTED (A) NOT DETECTED Final    Comment: Methicillin (oxacillin) resistant coagulase negative staphylococcus. Possible blood culture contaminant (unless isolated from more than one blood culture draw or clinical case suggests pathogenicity). No antibiotic treatment is indicated for blood  culture contaminants. CRITICAL RESULT CALLED TO, READ BACK BY AND VERIFIED WITH: PHARMD GREG ABBOTT 12/14/20_1 :55 BY TW    Staphylococcus lugdunensis NOT DETECTED NOT DETECTED Final   Streptococcus species NOT DETECTED NOT DETECTED Final   Streptococcus agalactiae NOT DETECTED NOT DETECTED Final   Streptococcus pneumoniae NOT DETECTED NOT DETECTED Final   Streptococcus pyogenes NOT DETECTED NOT DETECTED Final   A.calcoaceticus-baumannii NOT DETECTED NOT DETECTED Final   Bacteroides fragilis NOT DETECTED NOT DETECTED Final   Enterobacterales NOT DETECTED NOT DETECTED Final   Enterobacter cloacae complex NOT DETECTED NOT DETECTED Final   Escherichia coli NOT DETECTED NOT DETECTED Final   Klebsiella aerogenes NOT DETECTED NOT DETECTED Final    Klebsiella oxytoca NOT DETECTED NOT DETECTED Final   Klebsiella pneumoniae NOT DETECTED NOT DETECTED Final   Proteus species NOT DETECTED NOT DETECTED Final   Salmonella species NOT DETECTED NOT DETECTED Final   Serratia marcescens NOT DETECTED NOT DETECTED Final   Haemophilus influenzae NOT DETECTED NOT DETECTED Final   Neisseria meningitidis NOT DETECTED NOT DETECTED Final   Pseudomonas aeruginosa NOT DETECTED NOT DETECTED Final   Stenotrophomonas maltophilia NOT DETECTED NOT DETECTED Final   Candida albicans NOT DETECTED NOT DETECTED Final   Candida auris NOT DETECTED NOT DETECTED Final   Candida glabrata NOT DETECTED NOT DETECTED Final   Candida krusei NOT DETECTED NOT DETECTED Final   Candida parapsilosis NOT DETECTED NOT DETECTED Final   Candida tropicalis NOT DETECTED NOT DETECTED Final   Cryptococcus neoformans/gattii NOT DETECTED NOT DETECTED Final   Methicillin resistance mecA/C DETECTED (A) NOT DETECTED Final    Comment: CRITICAL RESULT CALLED TO, READ BACK BY AND VERIFIED WITH: PHARMD GREG ABBOTT 12/14/20_2 :55 BY TW Performed at [facility], [address] [place], [place] [address]   Urine Culture     Status: Abnormal (Preliminary result)   Collection Time: 12/13/20  5:27 PM   Specimen: Urine, Catheterized  Result Value Ref Range Status   Specimen Description URINE, CATHETERIZED  Final   Special Requests NONE  Final   Culture (A)  Final    >=100,000 COLONIES/mL ENTEROCOCCUS FAECALIS 30,000 COLONIES/mL KLEBSIELLA PNEUMONIAE REPEATING SUSCEPTIBILITY FOR ENTEROCOCCUS FAECALIS Performed at [facility], [place] [address], [place], [place] [address]    Report Status PENDING  Incomplete   Organism ID, Bacteria KLEBSIELLA PNEUMONIAE (A)  Final      Susceptibility   Klebsiella pneumoniae - MIC*    AMPICILLIN >=32 RESISTANT Resistant  CEFAZOLIN <=4 SENSITIVE Sensitive     CEFEPIME <=0.12 SENSITIVE Sensitive     CEFTRIAXONE <=0.25 SENSITIVE Sensitive      CIPROFLOXACIN <=0.25 SENSITIVE Sensitive     GENTAMICIN <=1 SENSITIVE Sensitive     IMIPENEM <=0.25 SENSITIVE Sensitive     NITROFURANTOIN 128 RESISTANT Resistant     TRIMETH/SULFA <=20 SENSITIVE Sensitive     AMPICILLIN/SULBACTAM 4 SENSITIVE Sensitive     PIP/TAZO <=4 SENSITIVE Sensitive     * 30,000 COLONIES/mL KLEBSIELLA PNEUMONIAE  Culture, blood (Routine X 2) w Reflex to ID Panel     Status: Abnormal (Preliminary result)   Collection Time: 12/14/20  7:57 AM   Specimen: BLOOD LEFT HAND  Result Value Ref Range Status   Specimen Description BLOOD LEFT HAND  Final   Special Requests   Final    BOTTLES DRAWN AEROBIC AND ANAEROBIC Blood Culture adequate volume   Culture  Setup Time   Final    GRAM POSITIVE COCCI IN CLUSTERS IN BOTH AEROBIC AND ANAEROBIC BOTTLES CRITICAL VALUE NOTED.  VALUE IS CONSISTENT WITH PREVIOUSLY REPORTED AND CALLED VALUE.    Culture (A)  Final    STAPHYLOCOCCUS CAPITIS CULTURE REINCUBATED FOR BETTER GROWTH Performed at [facility], [place] [address], [place], [place] [address]    Report Status PENDING  Incomplete  Culture, blood (Routine X 2) w Reflex to ID Panel     Status: None (Preliminary result)   Collection Time: 12/14/20  8:04 AM   Specimen: BLOOD RIGHT HAND  Result Value Ref Range Status   Specimen Description BLOOD RIGHT HAND  Final   Special Requests   Final    BOTTLES DRAWN AEROBIC AND ANAEROBIC Blood Culture adequate volume   Culture   Final    NO GROWTH 2 DAYS Performed at [facility], [place] [address] [place], [place] [address]    Report Status PENDING  Incomplete         Radiology Studies: No results found.      Scheduled Meds:  arformoterol  15 mcg Nebulization BID   baclofen  5 mg Per Tube TID   budesonide  0.5 mg Nebulization BID   chlorhexidine gluconate (MEDLINE KIT)  15 mL Mouth Rinse BID   Chlorhexidine Gluconate Cloth  6 each Topical Daily   clonazePAM  1 mg Per Tube BID   clopidogrel  75 mg Per Tube  Daily   enoxaparin (LOVENOX) injection  0.5 mg/kg Subcutaneous Q24H   famotidine  20 mg Per Tube BID   feeding supplement (PROSource TF)  45 mL Per Tube BID   fiber  1 packet Per Tube BID   free water  200 mL Per Tube Q4H   Gerhardt's butt cream   Topical BID   guaiFENesin  5 mL Per Tube Q6H   hydrocortisone cream   Topical TID   insulin aspart  0-15 Units Subcutaneous Q4H   insulin glargine  40 Units Subcutaneous QHS   levETIRAcetam  2,500 mg Per Tube BID   loratadine  10 mg Per Tube Daily   mouth rinse  15 mL Mouth Rinse QID   midodrine  2.5 mg Per NG tube Q8H   montelukast  10 mg Per Tube Daily   nutrition supplement (JUVEN)  1 packet Per Tube BID   revefenacin  175 mcg Nebulization Daily   tiZANidine  2 mg Per Tube QHS   valproic acid  1,000 mg Per Tube Q12H   Continuous Infusions:  sodium chloride  feeding supplement (JEVITY 1.5 CAL/FIBER) 1,000 mL (12/17/20 0207)   vancomycin Stopped (12/16/20 1130)     LOS: 77 days    Time spent: 35 mins,More than 50% of that time was spent in counseling and/or coordination of care.      Shelly Coss, MD [facility] P8/28/2022, 7:59 AM

## 2020-12-18 DIAGNOSIS — R569 Unspecified convulsions: Secondary | ICD-10-CM | POA: Diagnosis not present

## 2020-12-18 DIAGNOSIS — J9621 Acute and chronic respiratory failure with hypoxia: Secondary | ICD-10-CM | POA: Diagnosis not present

## 2020-12-18 DIAGNOSIS — A491 Streptococcal infection, unspecified site: Secondary | ICD-10-CM

## 2020-12-18 DIAGNOSIS — R7881 Bacteremia: Secondary | ICD-10-CM | POA: Diagnosis not present

## 2020-12-18 DIAGNOSIS — B961 Klebsiella pneumoniae [K. pneumoniae] as the cause of diseases classified elsewhere: Secondary | ICD-10-CM

## 2020-12-18 DIAGNOSIS — J398 Other specified diseases of upper respiratory tract: Secondary | ICD-10-CM | POA: Diagnosis not present

## 2020-12-18 DIAGNOSIS — G931 Anoxic brain damage, not elsewhere classified: Secondary | ICD-10-CM | POA: Diagnosis not present

## 2020-12-18 DIAGNOSIS — N3090 Cystitis, unspecified without hematuria: Secondary | ICD-10-CM | POA: Insufficient documentation

## 2020-12-18 DIAGNOSIS — Z1621 Resistance to vancomycin: Secondary | ICD-10-CM | POA: Insufficient documentation

## 2020-12-18 LAB — VANCOMYCIN, PEAK: Vancomycin Pk: 27 ug/mL — ABNORMAL LOW (ref 30–40)

## 2020-12-18 LAB — GLUCOSE, CAPILLARY
Glucose-Capillary: 101 mg/dL — ABNORMAL HIGH (ref 70–99)
Glucose-Capillary: 101 mg/dL — ABNORMAL HIGH (ref 70–99)
Glucose-Capillary: 132 mg/dL — ABNORMAL HIGH (ref 70–99)
Glucose-Capillary: 132 mg/dL — ABNORMAL HIGH (ref 70–99)
Glucose-Capillary: 144 mg/dL — ABNORMAL HIGH (ref 70–99)
Glucose-Capillary: 147 mg/dL — ABNORMAL HIGH (ref 70–99)

## 2020-12-18 LAB — MRSA NEXT GEN BY PCR, NASAL: MRSA by PCR Next Gen: NOT DETECTED

## 2020-12-18 NOTE — Progress Notes (Addendum)
I have seen and examined the patient. I have personally reviewed the clinical findings, laboratory findings, microbiological data and imaging studies. The assessment and treatment plan was discussed with the  Advance Practice Provider, Mauricio Po  I agree with her/his recommendations except following additions/corrections.  Blood cultures 8/24 1st set staph capitis and staph epidermidis and 2nd set staph capitis only   Blood cultures 8/25 1st set staph capitis and staph epidermidis and 2nd set with no growth  Blood cultures 8/27 No growth to in 2 days   ID pharmacist Raquel Sarna has requested susceptibilities on all isolates today to look for any discordance. High possibility this is contaminant vs a true bacteremia given 2 different organisms and low suspicion for infection clinically  Of note, patient has h/o Left THA - no concerns of infection currently Recent TTE on 10/03/20 negative for endocarditis   Plan for 7 day course of Vancomycin if blood cultures on 8/27 continues to remain no growth given Left THA   Rosiland Oz, MD [facility]  Date of Admission:  10/02/2020     Total days of antibiotics 4         ASSESSMENT:  Ms. Zaragoza repeat blood cultures from 12/16/20 have remained without growth to date. Suspect that her previous PIV may have been the source of her bacteremia with Staphylococcus capitis with Staphylococcus epidermidis being a contaminant with differing organisms. Continue treatment with vancomycin. Renal function remains stable. Continue supportive care per primary team.   PLAN:  Continue vancomycin for now Therapeutic drug monitoring of renal function and vancomycin levels per protocol.  Monitor cultures for clearance of bacteremia.  Continue supportive care per primary team.   Principal Problem:   Sepsis ([place]) Active Problems:   COPD (chronic  obstructive pulmonary disease) (HCC)   Chronic diastolic CHF (congestive heart failure) (HCC)   Acute on chronic respiratory failure with hypoxia (HCC)   GERD (gastroesophageal reflux disease)   Seizures (HCC)   Type 2 diabetes mellitus without complication, with long-term current use of insulin (HCC)   Severe hypoxic-ischemic encephalopathy   Tracheostomy dependence (HCC)   Pressure injury of skin   Difficult airway   Ventilator dependence (HCC)   Acquired tracheal collapse   Anoxic brain injury ([place])   MRSA bacteremia    arformoterol  15 mcg Nebulization BID   baclofen  5 mg Per Tube TID   budesonide  0.5 mg Nebulization BID   chlorhexidine gluconate (MEDLINE KIT)  15 mL Mouth Rinse BID   Chlorhexidine Gluconate Cloth  6 each Topical Daily   clonazePAM  1 mg Per Tube BID   clopidogrel  75 mg Per Tube Daily   enoxaparin (LOVENOX) injection  0.5 mg/kg Subcutaneous Q24H   famotidine  20 mg Per Tube BID   feeding supplement (PROSource TF)  45 mL Per Tube BID   fiber  1 packet Per Tube BID   free water  200 mL Per Tube Q4H   Gerhardt's butt cream   Topical BID   guaiFENesin  5 mL Per Tube Q6H   hydrocortisone cream   Topical TID   insulin aspart  0-15 Units Subcutaneous Q4H   insulin glargine  40 Units Subcutaneous QHS   levETIRAcetam  2,500 mg Per Tube BID   loratadine  10 mg Per Tube Daily   mouth rinse  15 mL Mouth Rinse QID   midodrine  2.5 mg  Per NG tube Q8H   montelukast  10 mg Per Tube Daily   nutrition supplement (JUVEN)  1 packet Per Tube BID   revefenacin  175 mcg Nebulization Daily   tiZANidine  2 mg Per Tube QHS   valproic acid  1,000 mg Per Tube Q12H    SUBJECTIVE:  Afebrile overnight with no acute events. Repeat cultures from 12/16/20 remain without growth to date.   Allergies  Allergen Reactions   Contrast Media [Iodinated Diagnostic Agents] Anaphylaxis and Other (See Comments)    ** CARDIAC ARREST **   Peanuts [Peanut Oil] Shortness Of Breath and  Swelling   Shellfish Allergy Shortness Of Breath and Swelling   Soap Shortness Of Breath and Swelling    LAUNDRY DETERGENT   Latex Hives   Adenosine    Eggs Or Egg-Derived Products Rash     Review of Systems: Review of Systems  Unable to perform ROS: Patient unresponsive     OBJECTIVE: Vitals:   12/18/20 0800 12/18/20 0803 12/18/20 0804 12/18/20 0806  BP: 125/61     Pulse: 79 80    Resp: 18 16    Temp:      TempSrc:      SpO2: 100% 96% 96% 96%  Weight:      Height:       Body mass index is 36.57 kg/m.  Physical Exam Constitutional:      General: She is not in acute distress.    Appearance: She is well-developed.     Comments: Lying in bed with head of bed elevated; unresponsive.   Cardiovascular:     Rate and Rhythm: Normal rate and regular rhythm.     Heart sounds: Normal heart sounds.  Pulmonary:     Effort: Pulmonary effort is normal.     Breath sounds: Normal breath sounds.     Comments: Tracheostomy present.  Skin:    General: Skin is warm and dry.    Lab Results Lab Results  Component Value Date   WBC 6.2 12/17/2020   HGB 9.5 (L) 12/17/2020   HCT 30.9 (L) 12/17/2020   MCV 88.3 12/17/2020   PLT 206 12/17/2020    Lab Results  Component Value Date   CREATININE 0.37 (L) 12/17/2020   BUN 22 12/17/2020   NA 138 12/17/2020   K 3.8 12/17/2020   CL 104 12/17/2020   CO2 27 12/17/2020    Lab Results  Component Value Date   ALT 14 12/13/2020   AST 16 12/13/2020   ALKPHOS 73 12/13/2020   BILITOT 0.3 12/13/2020     Microbiology: Recent Results (from the past 240 hour(s))  Culture, blood (routine x 2)     Status: Abnormal   Collection Time: 12/13/20  8:46 AM   Specimen: BLOOD LEFT HAND  Result Value Ref Range Status   Specimen Description BLOOD LEFT HAND  Final   Special Requests   Final    BOTTLES DRAWN AEROBIC AND ANAEROBIC Blood Culture results may not be optimal due to an inadequate volume of blood received in culture bottles   Culture   Setup Time   Final    GRAM POSITIVE COCCI CRITICAL VALUE NOTED.  VALUE IS CONSISTENT WITH PREVIOUSLY REPORTED AND CALLED VALUE. ANAEROBIC BOTTLE ONLY    Culture (A)  Final    STAPHYLOCOCCUS CAPITIS STAPHYLOCOCCUS EPIDERMIDIS SUSCEPTIBILITIES PERFORMED ON PREVIOUS CULTURE WITHIN THE LAST 5 DAYS. Performed at [facility], [place] [address], [place], [place] [address]    Report Status 12/18/2020  FINAL  Final   Organism ID, Bacteria STAPHYLOCOCCUS CAPITIS  Final      Susceptibility   Staphylococcus capitis - MIC*    CIPROFLOXACIN >=8 RESISTANT Resistant     ERYTHROMYCIN >=8 RESISTANT Resistant     GENTAMICIN 8 INTERMEDIATE Intermediate     OXACILLIN >=4 RESISTANT Resistant     TETRACYCLINE 2 SENSITIVE Sensitive     VANCOMYCIN <=0.5 SENSITIVE Sensitive     TRIMETH/SULFA <=10 SENSITIVE Sensitive     CLINDAMYCIN >=8 RESISTANT Resistant     RIFAMPIN <=0.5 SENSITIVE Sensitive     Inducible Clindamycin NEGATIVE Sensitive     * STAPHYLOCOCCUS CAPITIS  Culture, blood (routine x 2)     Status: Abnormal   Collection Time: 12/13/20  9:04 AM   Specimen: BLOOD RIGHT HAND  Result Value Ref Range Status   Specimen Description BLOOD RIGHT HAND  Final   Special Requests   Final    BOTTLES DRAWN AEROBIC ONLY Blood Culture results may not be optimal due to an inadequate volume of blood received in culture bottles   Culture  Setup Time   Final    GRAM POSITIVE COCCI AEROBIC BOTTLE ONLY CRITICAL RESULT CALLED TO, READ BACK BY AND VERIFIED WITH: PHARMD GREG ABBOTT 12/14/20@05 :55 BY TW    Culture (A)  Final    STAPHYLOCOCCUS CAPITIS SUSCEPTIBILITIES PERFORMED ON PREVIOUS CULTURE WITHIN THE LAST 5 DAYS. Performed at [facility], [place] [address] [place], [place] [address]    Report Status 12/18/2020 FINAL  Final  Blood Culture ID Panel (Reflexed)     Status: Abnormal   Collection Time: 12/13/20  9:04 AM  Result Value Ref Range Status   Enterococcus faecalis NOT DETECTED NOT DETECTED  Final   Enterococcus Faecium NOT DETECTED NOT DETECTED Final   Listeria monocytogenes NOT DETECTED NOT DETECTED Final   Staphylococcus species DETECTED (A) NOT DETECTED Final    Comment: CRITICAL RESULT CALLED TO, READ BACK BY AND VERIFIED WITH: PHARMD GREG ABBOTT 12/14/20@05 :55 BY TW    Staphylococcus aureus (BCID) NOT DETECTED NOT DETECTED Final   Staphylococcus epidermidis DETECTED (A) NOT DETECTED Final    Comment: Methicillin (oxacillin) resistant coagulase negative staphylococcus. Possible blood culture contaminant (unless isolated from more than one blood culture draw or clinical case suggests pathogenicity). No antibiotic treatment is indicated for blood  culture contaminants. CRITICAL RESULT CALLED TO, READ BACK BY AND VERIFIED WITH: PHARMD GREG ABBOTT 12/14/20@5 :55 BY TW    Staphylococcus lugdunensis NOT DETECTED NOT DETECTED Final   Streptococcus species NOT DETECTED NOT DETECTED Final   Streptococcus agalactiae NOT DETECTED NOT DETECTED Final   Streptococcus pneumoniae NOT DETECTED NOT DETECTED Final   Streptococcus pyogenes NOT DETECTED NOT DETECTED Final   A.calcoaceticus-baumannii NOT DETECTED NOT DETECTED Final   Bacteroides fragilis NOT DETECTED NOT DETECTED Final   Enterobacterales NOT DETECTED NOT DETECTED Final   Enterobacter cloacae complex NOT DETECTED NOT DETECTED Final   Escherichia coli NOT DETECTED NOT DETECTED Final   Klebsiella aerogenes NOT DETECTED NOT DETECTED Final   Klebsiella oxytoca NOT DETECTED NOT DETECTED Final   Klebsiella pneumoniae NOT DETECTED NOT DETECTED Final   Proteus species NOT DETECTED NOT DETECTED Final   Salmonella species NOT DETECTED NOT DETECTED Final   Serratia marcescens NOT DETECTED NOT DETECTED Final   Haemophilus influenzae NOT DETECTED NOT DETECTED Final   Neisseria meningitidis NOT DETECTED NOT DETECTED Final   Pseudomonas aeruginosa NOT DETECTED NOT DETECTED Final   Stenotrophomonas maltophilia NOT DETECTED NOT DETECTED  Final  Candida albicans NOT DETECTED NOT DETECTED Final   Candida auris NOT DETECTED NOT DETECTED Final   Candida glabrata NOT DETECTED NOT DETECTED Final   Candida krusei NOT DETECTED NOT DETECTED Final   Candida parapsilosis NOT DETECTED NOT DETECTED Final   Candida tropicalis NOT DETECTED NOT DETECTED Final   Cryptococcus neoformans/gattii NOT DETECTED NOT DETECTED Final   Methicillin resistance mecA/C DETECTED (A) NOT DETECTED Final    Comment: CRITICAL RESULT CALLED TO, READ BACK BY AND VERIFIED WITH: PHARMD GREG ABBOTT 12/14/20_0 :55 BY TW Performed at [facility] [address] [place], [place] [address]   Urine Culture     Status: Abnormal   Collection Time: 12/13/20  5:27 PM   Specimen: Urine, Catheterized  Result Value Ref Range Status   Specimen Description URINE, CATHETERIZED  Final   Special Requests   Final    NONE Performed at [facility], [address], [place], [place] [address]    Culture (A)  Final    >=100,000 COLONIES/mL ENTEROCOCCUS FAECALIS 30,000 COLONIES/mL KLEBSIELLA PNEUMONIAE VANCOMYCIN RESISTANT ENTEROCOCCUS    Report Status 12/17/2020 FINAL  Final   Organism ID, Bacteria KLEBSIELLA PNEUMONIAE (A)  Final   Organism ID, Bacteria ENTEROCOCCUS FAECALIS (A)  Final      Susceptibility   Enterococcus faecalis - MIC*    AMPICILLIN <=2 SENSITIVE Sensitive     NITROFURANTOIN <=16 SENSITIVE Sensitive     VANCOMYCIN >=32 RESISTANT Resistant     LINEZOLID 2 SENSITIVE Sensitive     * >=100,000 COLONIES/mL ENTEROCOCCUS FAECALIS   Klebsiella pneumoniae - MIC*    AMPICILLIN >=32 RESISTANT Resistant     CEFAZOLIN <=4 SENSITIVE Sensitive     CEFEPIME <=0.12 SENSITIVE Sensitive     CEFTRIAXONE <=0.25 SENSITIVE Sensitive     CIPROFLOXACIN <=0.25 SENSITIVE Sensitive     GENTAMICIN <=1 SENSITIVE Sensitive     IMIPENEM <=0.25 SENSITIVE Sensitive     NITROFURANTOIN 128 RESISTANT Resistant     TRIMETH/SULFA <=20 SENSITIVE Sensitive      AMPICILLIN/SULBACTAM 4 SENSITIVE Sensitive     PIP/TAZO <=4 SENSITIVE Sensitive     * 30,000 COLONIES/mL KLEBSIELLA PNEUMONIAE  Culture, blood (Routine X 2) w Reflex to ID Panel     Status: Abnormal   Collection Time: 12/14/20  7:57 AM   Specimen: BLOOD LEFT HAND  Result Value Ref Range Status   Specimen Description BLOOD LEFT HAND  Final   Special Requests   Final    BOTTLES DRAWN AEROBIC AND ANAEROBIC Blood Culture adequate volume   Culture  Setup Time   Final    GRAM POSITIVE COCCI IN CLUSTERS IN BOTH AEROBIC AND ANAEROBIC BOTTLES CRITICAL VALUE NOTED.  VALUE IS CONSISTENT WITH PREVIOUSLY REPORTED AND CALLED VALUE. Performed at [facility], [place] [address] [place], [place] [address]    Culture (A)  Final    STAPHYLOCOCCUS CAPITIS SUSCEPTIBILITIES PERFORMED ON PREVIOUS CULTURE WITHIN THE LAST 5 DAYS. STAPHYLOCOCCUS EPIDERMIDIS    Report Status 12/18/2020 FINAL  Final   Organism ID, Bacteria STAPHYLOCOCCUS EPIDERMIDIS  Final      Susceptibility   Staphylococcus epidermidis - MIC*    CIPROFLOXACIN 2 INTERMEDIATE Intermediate     ERYTHROMYCIN >=8 RESISTANT Resistant     GENTAMICIN >=16 RESISTANT Resistant     OXACILLIN >=4 RESISTANT Resistant     TETRACYCLINE 2 SENSITIVE Sensitive     VANCOMYCIN 1 SENSITIVE Sensitive     TRIMETH/SULFA 80 RESISTANT Resistant     CLINDAMYCIN >=8 RESISTANT  Resistant     RIFAMPIN <=0.5 SENSITIVE Sensitive     Inducible Clindamycin NEGATIVE Sensitive     * STAPHYLOCOCCUS EPIDERMIDIS  Culture, blood (Routine X 2) w Reflex to ID Panel     Status: None (Preliminary result)   Collection Time: 12/14/20  8:04 AM   Specimen: BLOOD RIGHT HAND  Result Value Ref Range Status   Specimen Description BLOOD RIGHT HAND  Final   Special Requests   Final    BOTTLES DRAWN AEROBIC AND ANAEROBIC Blood Culture adequate volume   Culture   Final    NO GROWTH 4 DAYS Performed at [facility], [address], [place], [place] [address]    Report Status  PENDING  Incomplete  Culture, blood (routine x 2)     Status: None (Preliminary result)   Collection Time: 12/16/20  3:07 PM   Specimen: BLOOD LEFT HAND  Result Value Ref Range Status   Specimen Description BLOOD LEFT HAND  Final   Special Requests   Final    BOTTLES DRAWN AEROBIC ONLY Blood Culture results may not be optimal due to an inadequate volume of blood received in culture bottles   Culture   Final    NO GROWTH 2 DAYS Performed at [facility], [place] [address], [place], [place] [address]    Report Status PENDING  Incomplete  Culture, blood (routine x 2)     Status: None (Preliminary result)   Collection Time: 12/16/20  3:16 PM   Specimen: BLOOD RIGHT HAND  Result Value Ref Range Status   Specimen Description BLOOD RIGHT HAND  Final   Special Requests   Final    BOTTLES DRAWN AEROBIC ONLY Blood Culture results may not be optimal due to an inadequate volume of blood received in culture bottles   Culture   Final    NO GROWTH 2 DAYS Performed at [facility], [place] [address], [place], [place] [address]    Report Status PENDING  Incomplete     Terri Piedra, [place] [facility] for [facility]  12/18/2020  10:26 AM

## 2020-12-18 NOTE — Progress Notes (Signed)
NAME:  Sonia Drake, MRN:  [id number], DOB:  1955-05-25, LOS: 78 ADMISSION DATE:  09/29/2020, CONSULTATION DATE:  6/15 REFERRING MD:  Thedore Mins, CHIEF COMPLAINT:  Dyspnea   History of Present Illness:  65 y/o female with anoxic brain injury, tracheostomy status who was admitted from a SNF on 6/12 with worsening respiratory failure.  She had previously been ventilator dependent but had been weaned from the trach.  On 6/16 a bronchoscopy revealed dynamic collapse of her trachea, tracheostomy was replaced.  Later required mechanical ventilation.  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 No distress. WBC ct trending down. No events overnight growing GPC clusters in two of two cultures  6/16 increased work of breathing, upper airway noise, abdominal muscle use.  Bronchoscopy performed as trach exchange cuffless #8 Shiley XLT showed dynamic airway collapse, tracheal collapse. 6/16 continued respiratory distress moved to ICU, changed to cuffed #6 Shiley XLT, placed on MV with some improved comfort 6/12 cefepime > 6/14, 6/18 6/12 vanc > 6/14, 6/18 6/23 mucus plugging, severe vent dyssynchrony 6/24-25 intermittent jerking, seizures on EEG 6/27 weaned off propofol gtt 6/28 briefly tolerated trach collar in AM 6/29 tolerated trach collar in AM; no further seizure like activity; working with CM/SW for vent-SNF 7/1 Weaning on CPAP/ PS 10/5, CM SW working on [employer], NO seizure activity, secretions are not an issue per  nursing 7/11 trach changed to #8 Bivona with air cuff, much improved. 7/12 TCT 12 hours; rested on Vent overnight 7/13 on TCT 7/23 Trach collar tolerated 7/24 Full vent support 7/26>> Remains on full vent support this am, no issues with secretions per nursing 8/5 occluding trach with dynamic collapse: seen on bronchoscopy 8/6 called back emergently to bedside for tracheostomy occlusion: emergent bronch, bivona trach migrated back 2cm, improved with advancing to 2cm above carina 8/8 collapse caused hypoxia. Improved with repositioning  8/9 afternoon had another episode of tracheal collapse and occlusion of trach requiring BVM, sedation. Family produced paperwork which seems to have conflicting information about pts wishes  8/11: pt continues to have multiple daily episodes of tracheal collapse/mucous plugging req lavage, BVM. 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 Neuro consulted  Interim History / Subjective:   No significant events overnight. On vancomycin for CoNS bacteremia.  Objective   Blood pressure 125/61, pulse 80, temperature (!) 97.4 F (36.3 C), temperature source Oral, resp. rate 16, height 5\' 2"  (1.575 m), weight 90.7 kg, SpO2 96 %.    Vent Mode: PRVC FiO2 (%):  [30 %] 30 % Set Rate:  [16 bmp] 16 bmp Vt Set:  [400 mL] 400 mL PEEP:  [5 cmH20] 5 cmH20 Plateau Pressure:  [16 cmH20-21 cmH20] 17 cmH20   Intake/Output Summary (Last 24 hours) at 12/18/2020 0949 Last data filed at 12/18/2020 0800 Gross per 24 hour  Intake 2234.89 ml  Output 1250 ml  Net 984.89 ml   Filed Weights   12/11/20 0400 12/17/20 0337 12/18/20 0500  Weight: 87.9 kg 90.7 kg 90.7 kg   Physical Exam: General  appearance: 65 y.o., female, eyes closed, no distress, female, eyes closed, no distress  HENT: NCAT; dry MM Neck: Trach in place; no lymphadenopathy, no JVD Lungs: mechanical breath sounds, equal chest rise CV: RRR, no MRGs   Abdomen: Soft, non-tender; non-distended, BS present  Extremities: trace dependent edema, warm Neuro: does not follow commands, does not open eyes to voice  No new pertinent imaging  No leukocytosis, S Cr stable  Resolved Hospital Problem list     Assessment & Plan:   Anoxic encephalopathy / anoxic brain injury Acute on chronic respiratory failure w hypercarbia and hypoxia  Severe tracheobronchomalacia Trach status Hx COPD  -Recurrent episodes of desats with hyperdynamic collapse of airway in spite of increasing PEEP and repositioning trach -etiology of trach occlusion, dynamic airway collapse. Associated severe dyssynchrony  Plan: Would still hold off on pressure support weans, anticipate ongoing vent dependence Continue bivona trach   Pulmonary will continue to intermittently follow   [facility]/Critical Care   If no response to pager , please call [phone] until 7pm After 7:00 pm call Elink  [phone] 12/18/2020, 9:49 AM

## 2020-12-18 NOTE — Plan of Care (Signed)
  Problem: Clinical Measurements: Goal: Respiratory complications will improve Outcome: Progressing   Problem: Elimination: Goal: Will not experience complications related to bowel motility Outcome: Progressing Goal: Will not experience complications related to urinary retention Outcome: Progressing   Problem: Activity: Goal: Risk for activity intolerance will decrease Outcome: Not Progressing Note: Chronically bed bound.

## 2020-12-18 NOTE — Progress Notes (Addendum)
[facility] PROGRESS NOTE  Sonia Drake ZOX:[id number] DOB: 22-Nov-1955 DOA: 10/02/2020 PCP: Garwin Brothers, MD  Status: Remains inpatient appropriate because:Altered mental status, Unsafe d/c plan, and Inpatient level of care appropriate due to severity of illness  Dispo: The patient is from: SNF              Anticipated d/c is to: SN[facility] [place] but due to the fact she has an adjustable trach and given her recurrent airway issues as described above they have declined to take her this particular facility.                Patient currently is not medically stable to d/c.   Difficult to place patient Yes   Level of care: ICU  Code Status: Full-pulmonary medicine documents paperwork that presents some conflicting information about the patient's wishes Family Communication: Spoke with patient's daughter in detail on 8/17.  8/26 updated daughter on new diagnosis of MRSE bacteremia. DVT prophylaxis: Lovenox COVID vaccination status: Per H&P patient is not vaccinated  **8/22 extensive conversations held with both patient's [name] as well as patient's niece [name].  Please see separate documentation regarding these conversations**  The following pertains to patient's living will dated June 22nd, 2016: 1) no sedative medications to keep her asleep.  She wants to be fully awake regardless of pain 2) she requested all means to keep her alive.  She wants to remain on life support until her daughter, [name] and the grandchildren decide otherwise after consulting with the medical team 3) she also hand wrote that she wishes to be allowed 82 to 90 days to see if she can pull through  HPI: 65 y/o female with medical history significant of asthma/COPD, CHF, cardiac arrest with PEA/CPR with chronic respiratory failure on permanent trach with supplemental oxygen at 6L, type 2 DM, GERD, seizures and severe hypoxic-ischemic encephalopathy who  presented to ER via EMS for respiratory distress from SNF.  She presented with findings consistent with acute aspiration pneumonitis.  She decompensated after arrival and required transfer to ICU and initiation of mechanical ventilation.  She had sepsis physiology presumed secondary to [facility] bacteremia but it was later determined that the blood culture results were related to contaminants and patient was no longer determined to be septic   She had previously been ventilator dependent but had been weaned from the ventilator briefly during her stay. On 6/16 a bronchoscopy revealed dynamic collapse of her trachea, tracheostomy was changed from 8.0 XLT cuffless to 6.0 XLT cuffed. She now is requiring chronic ventilatory support 2/2 advanced COPD/asthma and tracheobronchomalacia. PCCM physicians updated patient's daughter on status and poor prognosis along with recommendation for chronic vent.  Palliative medicine has had multiple discussions with the daughter.  Daughter continues to request aggressive care.  Unfortunately she continues to have multiple daily events regarding tracheal collapse with mucous plugging requiring excessive lavage and suction as well as bag-valve-mask for ventilation  Patient has had reemergence of continuous seizure activity.  Initially detected by noting patient with rhythmic blinking.  Continuous EEG revealed ongoing seizure activity.  On 8/23 during exam with sternal rub patient had what appeared to be decerebrate posturing, eyes were opened and noted with sluggish response to pupils.  Not long after eyes were opened for examination patient began having forceful, rhythmic spasms of the bilateral face that appeared consistent with seizure activity.  I returned to her room 1 hour later and her eyes were  open and she was blinking constantly.  She had no blink reflex when tested.  She had another facial twitch that appeared consistent with seizure activity.  RN at bedside and witnessed  these findings.  Significant Hospital events: 6/12 admitted from skilled nursing facility after aspiration event and copious secretions per tracheostomy 6/13 appears comfortable currently on 35% ATC. No distress. WBC ct trending down. No events overnight growing GPC clusters in two of two cultures  6/16 increased work of breathing, upper airway noise, abdominal muscle use.  Bronchoscopy performed as trach exchange cuffless #8 Shiley XLT showed dynamic airway collapse, tracheal collapse. 6/16 continued respiratory distress moved to ICU, changed to cuffed #6 Shiley XLT, placed on MV with some improved comfort 6/12 cefepime > 6/14, 6/18 6/12 vanc > 6/14, 6/18 6/23 mucus plugging, severe vent dyssynchrony 6/24-25 intermittent jerking, seizures on EEG 6/27 weaned off propofol gtt 6/28 briefly tolerated trach collar in AM 6/29 tolerated trach collar in AM; no further seizure like activity; working with CM/SW for vent-SNF 7/1 Weaning on CPAP/ PS 10/5, CM SW working on [employer], NO seizure activity, secretions are not an issue per nursing 7/11 trach changed to #8 Bivona with air cuff, much improved. 7/12 TCT 12 hours; rested on Vent overnight 7/13 on TCT 7/23 Trach collar tolerated 7/24 Full vent support 7/26>> Remains on full vent support this am, no issues with secretions per nursing 8/5 occluding trach with dynamic collapse: seen on bronchoscopy 8/6 called back emergently to bedside for tracheostomy occlusion: emergent bronch, bivona trach migrated back 2cm, improved with advancing to 2cm above carina 8/8 collapse caused hypoxia. Improved with repositioning  8/9 afternoon had another episode of tracheal collapse and occlusion of trach requiring BVM, sedation. Family produced paperwork which seems to have conflicting information about pts wishes  8/11: pt continues to have multiple daily episodes of tracheal collapse/mucous plugging req lavage, BVM. Repeated discussions with family re: code status  esp in light of paperwork  8/15 consultation placed with ethics committee to evaluate regarding futility of care 8/17 spoke at length with patient's daughter regarding futility of care CODE STATUS.  Daughter continues to desire aggressive measures. 8/26 through 8/27 blood cultures positive for MDR staph epidermis/capitis and is currently on IV vancomycin 8/28 urine culture positive for VRE and Klebsiella with of which are considered colonization and not active infection.  Subjective: Does not open eyes spontaneously today or to sternal rub.  Did withdrawal on the right tube painful nailbed stimulus.   Objective: Vitals:   12/18/20 0804 12/18/20 0806  BP:    Pulse:    Resp:    Temp:    SpO2: 96% 96%    Intake/Output Summary (Last 24 hours) at 12/18/2020 0824 Last data filed at 12/18/2020 0700 Gross per 24 hour  Intake 2234.89 ml  Output 1250 ml  Net 984.89 ml   Filed Weights   12/11/20 0400 12/17/20 0337 12/18/20 0500  Weight: 87.9 kg 90.7 kg 90.7 kg    Exam:  Constitutional: Unresponsive Respiratory: 8.0 cuffed Bivona air trach, ventilator settings: PRVC mode,VT 400, Rate 16, PEEP 5, PS 15, FiO2 30%; Lungs remain clear and coarse.  Continues respirations present Cardiovascular: SR/ST,, stable peripheral edema, normotensive on midodrine Abdomen: Nondistended with normoactive bowel sounds. PEG tube for feedings. LBM 8/28 Skin: Stage II decubitus consistent with several mild areas of excoriation without any periwound erythema. Neurologic: Remains unresponsive.  Withdraws right side to painful stimulus applied to nailbeds.  Pupils: Right 2 mm and left 4  mm with very sluggish reaction Psychiatric: Eyes closed.  Unresponsive    Assessment/Plan: Acute problems: Acute on chronic respiratory failure w/ hypoxemia Tracheostomy/vent dependence 2/2 Tracheobronchomalcia and advanced COPD/asthma Not a candidate to wean from ventilator or decannulation secondary to altered mental status  as well as continued issues with tracheal collapse requiring adjustable trach PCCM physician spoke at length with daughter about chronicity of these medical problems and that patient will always be ventilator dependent.  Daughter opted to continue current aggressive care.   I also spoke at length with daughter on 8/17 and she wishes to continue aggressive care  Patient continues to have issues with intermittent breath stacking as well as tracheal collapse.  Most times when given Ativan develops reflexive hypotension. (See below)  Known anoxic brain injury after cardiac arrest  Patient without any meaningful improvement.  Currently she continues to have underlying seizure activity as evidenced by rhythmic eye blinking and focal twitching in face and eyes. 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 Prognosis extremely poor and at this juncture it appears we are approaching futility of care.  Palliative medicine has reached out to the family on multiple occasions but they continue to wish for aggressive care.  Appreciate assistance of ethics team regarding futility of care concerns.  Discussed with palliative care team and given multiple interactions with patient's family at this juncture they feel they have nothing to offer and agree with ethics committee recommendations for pursuing futility of care.  Patient's family would like to continue care at least to the 90-day mark which will be in 16 days from today.  Seizure disorder Present prior to her cardiac arrest is worsened since her anoxic brain injury Continue Keppra, clonazepam (increased to 1 mg BID on 8/22) and Depakote Episodic issues related to status epilepticus requiring formal neurological consultation Patient with  repetitive blinking--EEG with run of polyspikes on EEG. likely due to h/o anoxic injury. (per Dr Hortense Ramal) LTM obtained oon 8/19-20 showed continued seizure spikes, attending physician over the weekend spoke with Dr. Hortense Ramal on 8/21 -Per neurologist: "Of note, patient has been in persistent vegetative state and I do not believe that treating the seizures will lead to any improvement in patient's outcome.  I would favor not aggressively treating them with antiepileptic medications as this would likely lead to sedation"  History of cardiac arrest Review of outpatient cardiology documents from 09/08/2019 revealed no evidence of CAD Patient was treated for cardiac arrest 2/2 acute hypoxia in August 2021 at [facility].  According to EMS records during that admission patient called 911 in context of respiratory failure.  She was gasping for air and then became unresponsive.  Upon EMS arrival patient was pulseless and apneic.  MDR bacteremia secondary to staph epidermis/capitis/colonization of VRE and Klebsiella in urine -T-max 99 prompting infectious work-up -Blood cultures obtained in 2/2 positive for gram-positive cocci with 1 set with staph epidermis that is methicillin-resistant based on [facility] ID-repeat blood cultures also growing gram-positive cocci -ID has initiated vancomycin as of 8/26-plan is for 7 days of therapy pending clearance of organism from blood culture.  Second blood cultures from 8/25 also positive.  Additional set pending from 8/27 -8/29 patient has continued symptoms that have a variety of resistance patterns.  Awaiting ID recommendations for antibiotic coverage.  Continue IV vancomycin for now. 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 this a.m. SBP 130 so have changed order to hold dose if SBP >/= 130  History of CVA Continue Plavix   Hyponatremia Resolved after holding free water current sodium 139 Since initiation of free water flushes sodium has remained stable with current reading of 137  Diabetes mellitus 2 on long-term insulin with hyperglycemia Continue Lantus and SSI Hemoglobin A1c 7.0 Current CBGs well-controlled   Hypertension Blood pressure times has been suboptimal therefore beta-blocker discontinued.   Chronic diastolic dysfunction Echocardiogram this admission with grade 1 diastolic dysfunction, moderate LV hypertrophy hyperdynamic LV systolic function Currently euvolemic Monitor I/O Continue as needed beta-blocker for elevated heart rate -SBP remains suboptimal Not on ACE or ARB secondary to suboptimal blood pressure reading   Chronic protein malnutrition secondary to sequelae from anoxic brain injury/PEG tube dependence Continue current tube feedings with [employer] Nutrition Problem: Increased nutrient needs Etiology: wound healing Signs/Symptoms: estimated needs Interventions: Tube feeding Body mass index is 36.57 kg/m.   Anemia of chronic disease Hemoglobin remained stable around 10  Stage II sacral decubitus Wound / Incision (Open or Dehisced) 10/31/20 (MASD) Moisture Associated Skin Damage Buttocks Bilateral (Active)  Date First Assessed/Time First Assessed: 10/31/20 2100   Wound Type: (MASD) Moisture Associated Skin Damage  Location: Buttocks  Location Orientation: Bilateral  Present on Admission: No    Assessments 10/31/2020  9:20 PM 12/17/2020  8:00 PM  Dressing Type None;Other (Comment) Foam - Lift dressing  to assess site every shift  Dressing Status -- Clean;Dry;Intact  Dressing Change Frequency -- Twice a day  Site / Wound Assessment Pink --  Treatment Cleansed --     No Linked orders to display       Data Reviewed: Basic Metabolic Panel: Recent Labs  Lab 12/13/20 0846 12/17/20 0159  NA 137 138  K 3.8 3.8  CL 104 104  CO2 26 27  GLUCOSE 117* 110*  BUN 20 22  CREATININE 0.41* 0.37*  CALCIUM 10.1 10.1   Liver Function Tests: Recent Labs  Lab 12/13/20 0846  AST 16  ALT 14  ALKPHOS 73  BILITOT 0.3  PROT 5.7*  ALBUMIN 2.4*     No results for input(s): LIPASE, AMYLASE in the last 168 hours. No results for input(s): AMMONIA in the last 168 hours. CBC: Recent Labs  Lab 12/13/20 0846 12/17/20 0159  WBC 8.6 6.2  NEUTROABS 5.2 3.4  HGB 10.1* 9.5*  HCT 31.9* 30.9*  MCV 87.4 88.3  PLT 211 206   Cardiac Enzymes: No results for input(s): CKTOTAL, CKMB, CKMBINDEX, TROPONINI in the last 168 hours. BNP (last 3 results) Recent Labs    10/04/20 0051 10/05/20 0049 10/06/20 0548  BNP 145.8* 115.3* 125.4*    ProBNP (last 3 results) No results for input(s): PROBNP in the last 8760 hours.  CBG: Recent Labs  Lab 12/17/20 1708 12/17/20 1947 12/17/20 2334 12/18/20 0337 12/18/20 0742  GLUCAP 108* 136* 123* 101* 101*    Recent Results (from the past 240 hour(s))  Culture, blood (routine x 2)  Status: Abnormal   Collection Time: 12/13/20  8:46 AM   Specimen: BLOOD LEFT HAND  Result Value Ref Range Status   Specimen Description BLOOD LEFT HAND  Final   Special Requests   Final    BOTTLES DRAWN AEROBIC AND ANAEROBIC Blood Culture results may not be optimal due to an inadequate volume of blood received in culture bottles   Culture  Setup Time   Final    GRAM POSITIVE COCCI CRITICAL VALUE NOTED.  VALUE IS CONSISTENT WITH PREVIOUSLY REPORTED AND CALLED VALUE. ANAEROBIC BOTTLE ONLY    Culture (A)  Final    STAPHYLOCOCCUS CAPITIS STAPHYLOCOCCUS  EPIDERMIDIS SUSCEPTIBILITIES PERFORMED ON PREVIOUS CULTURE WITHIN THE LAST 5 DAYS. Performed at [facility], [place] [address], [place], [place] [address]    Report Status 12/18/2020 FINAL  Final   Organism ID, Bacteria STAPHYLOCOCCUS CAPITIS  Final      Susceptibility   Staphylococcus capitis - MIC*    CIPROFLOXACIN >=8 RESISTANT Resistant     ERYTHROMYCIN >=8 RESISTANT Resistant     GENTAMICIN 8 INTERMEDIATE Intermediate     OXACILLIN >=4 RESISTANT Resistant     TETRACYCLINE 2 SENSITIVE Sensitive     VANCOMYCIN <=0.5 SENSITIVE Sensitive     TRIMETH/SULFA <=10 SENSITIVE Sensitive     CLINDAMYCIN >=8 RESISTANT Resistant     RIFAMPIN <=0.5 SENSITIVE Sensitive     Inducible Clindamycin NEGATIVE Sensitive     * STAPHYLOCOCCUS CAPITIS  Culture, blood (routine x 2)     Status: Abnormal   Collection Time: 12/13/20  9:04 AM   Specimen: BLOOD RIGHT HAND  Result Value Ref Range Status   Specimen Description BLOOD RIGHT HAND  Final   Special Requests   Final    BOTTLES DRAWN AEROBIC ONLY Blood Culture results may not be optimal due to an inadequate volume of blood received in culture bottles   Culture  Setup Time   Final    GRAM POSITIVE COCCI AEROBIC BOTTLE ONLY CRITICAL RESULT CALLED TO, READ BACK BY AND VERIFIED WITH: PHARMD GREG ABBOTT 12/14/20@05 :55 BY TW    Culture (A)  Final    STAPHYLOCOCCUS CAPITIS SUSCEPTIBILITIES PERFORMED ON PREVIOUS CULTURE WITHIN THE LAST 5 DAYS. Performed at [facility], [place] [address], [place], [place] [address]    Report Status 12/18/2020 FINAL  Final  Blood Culture ID Panel (Reflexed)     Status: Abnormal   Collection Time: 12/13/20  9:04 AM  Result Value Ref Range Status   Enterococcus faecalis NOT DETECTED NOT DETECTED Final   Enterococcus Faecium NOT DETECTED NOT DETECTED Final   Listeria monocytogenes NOT DETECTED NOT DETECTED Final   Staphylococcus species DETECTED (A) NOT DETECTED Final    Comment: CRITICAL RESULT CALLED TO, READ  BACK BY AND VERIFIED WITH: PHARMD GREG ABBOTT 12/14/20@05 :55 BY TW    Staphylococcus aureus (BCID) NOT DETECTED NOT DETECTED Final   Staphylococcus epidermidis DETECTED (A) NOT DETECTED Final    Comment: Methicillin (oxacillin) resistant coagulase negative staphylococcus. Possible blood culture contaminant (unless isolated from more than one blood culture draw or clinical case suggests pathogenicity). No antibiotic treatment is indicated for blood  culture contaminants. CRITICAL RESULT CALLED TO, READ BACK BY AND VERIFIED WITH: PHARMD GREG ABBOTT 12/14/20@5 :55 BY TW    Staphylococcus lugdunensis NOT DETECTED NOT DETECTED Final   Streptococcus species NOT DETECTED NOT DETECTED Final   Streptococcus agalactiae NOT DETECTED NOT DETECTED Final   Streptococcus pneumoniae NOT DETECTED NOT DETECTED Final   Streptococcus pyogenes NOT DETECTED  NOT DETECTED Final   A.calcoaceticus-baumannii NOT DETECTED NOT DETECTED Final   Bacteroides fragilis NOT DETECTED NOT DETECTED Final   Enterobacterales NOT DETECTED NOT DETECTED Final   Enterobacter cloacae complex NOT DETECTED NOT DETECTED Final   Escherichia coli NOT DETECTED NOT DETECTED Final   Klebsiella aerogenes NOT DETECTED NOT DETECTED Final   Klebsiella oxytoca NOT DETECTED NOT DETECTED Final   Klebsiella pneumoniae NOT DETECTED NOT DETECTED Final   Proteus species NOT DETECTED NOT DETECTED Final   Salmonella species NOT DETECTED NOT DETECTED Final   Serratia marcescens NOT DETECTED NOT DETECTED Final   Haemophilus influenzae NOT DETECTED NOT DETECTED Final   Neisseria meningitidis NOT DETECTED NOT DETECTED Final   Pseudomonas aeruginosa NOT DETECTED NOT DETECTED Final   Stenotrophomonas maltophilia NOT DETECTED NOT DETECTED Final   Candida albicans NOT DETECTED NOT DETECTED Final   Candida auris NOT DETECTED NOT DETECTED Final   Candida glabrata NOT DETECTED NOT DETECTED Final   Candida krusei NOT DETECTED NOT DETECTED Final   Candida  parapsilosis NOT DETECTED NOT DETECTED Final   Candida tropicalis NOT DETECTED NOT DETECTED Final   Cryptococcus neoformans/gattii NOT DETECTED NOT DETECTED Final   Methicillin resistance mecA/C DETECTED (A) NOT DETECTED Final    Comment: CRITICAL RESULT CALLED TO, READ BACK BY AND VERIFIED WITH: PHARMD GREG ABBOTT 12/14/20@5 :55 BY TW Performed at [facility], [address], [place], [place] [address]   Urine Culture     Status: Abnormal   Collection Time: 12/13/20  5:27 PM   Specimen: Urine, Catheterized  Result Value Ref Range Status   Specimen Description URINE, CATHETERIZED  Final   Special Requests   Final    NONE Performed at [facility], [address] [place], [place] [address]    Culture (A)  Final    >=100,000 COLONIES/mL ENTEROCOCCUS FAECALIS 30,000 COLONIES/mL KLEBSIELLA PNEUMONIAE VANCOMYCIN RESISTANT ENTEROCOCCUS    Report Status 12/17/2020 FINAL  Final   Organism ID, Bacteria KLEBSIELLA PNEUMONIAE (A)  Final   Organism ID, Bacteria ENTEROCOCCUS FAECALIS (A)  Final      Susceptibility   Enterococcus faecalis - MIC*    AMPICILLIN <=2 SENSITIVE Sensitive     NITROFURANTOIN <=16 SENSITIVE Sensitive     VANCOMYCIN >=32 RESISTANT Resistant     LINEZOLID 2 SENSITIVE Sensitive     * >=100,000 COLONIES/mL ENTEROCOCCUS FAECALIS   Klebsiella pneumoniae - MIC*    AMPICILLIN >=32 RESISTANT Resistant     CEFAZOLIN <=4 SENSITIVE Sensitive     CEFEPIME <=0.12 SENSITIVE Sensitive     CEFTRIAXONE <=0.25 SENSITIVE Sensitive     CIPROFLOXACIN <=0.25 SENSITIVE Sensitive     GENTAMICIN <=1 SENSITIVE Sensitive     IMIPENEM <=0.25 SENSITIVE Sensitive     NITROFURANTOIN 128 RESISTANT Resistant     TRIMETH/SULFA <=20 SENSITIVE Sensitive     AMPICILLIN/SULBACTAM 4 SENSITIVE Sensitive     PIP/TAZO <=4 SENSITIVE Sensitive     * 30,000 COLONIES/mL KLEBSIELLA PNEUMONIAE  Culture, blood (Routine X 2) w Reflex to ID Panel     Status: Abnormal   Collection Time: 12/14/20   7:57 AM   Specimen: BLOOD LEFT HAND  Result Value Ref Range Status   Specimen Description BLOOD LEFT HAND  Final   Special Requests   Final    BOTTLES DRAWN AEROBIC AND ANAEROBIC Blood Culture adequate volume   Culture  Setup Time   Final    GRAM POSITIVE COCCI IN CLUSTERS IN BOTH AEROBIC AND ANAEROBIC BOTTLES CRITICAL VALUE  NOTED.  VALUE IS CONSISTENT WITH PREVIOUSLY REPORTED AND CALLED VALUE. Performed at [facility], [place] [address], [place], [place] [address]    Culture (A)  Final    STAPHYLOCOCCUS CAPITIS SUSCEPTIBILITIES PERFORMED ON PREVIOUS CULTURE WITHIN THE LAST 5 DAYS. STAPHYLOCOCCUS EPIDERMIDIS    Report Status 12/18/2020 FINAL  Final   Organism ID, Bacteria STAPHYLOCOCCUS EPIDERMIDIS  Final      Susceptibility   Staphylococcus epidermidis - MIC*    CIPROFLOXACIN 2 INTERMEDIATE Intermediate     ERYTHROMYCIN >=8 RESISTANT Resistant     GENTAMICIN >=16 RESISTANT Resistant     OXACILLIN >=4 RESISTANT Resistant     TETRACYCLINE 2 SENSITIVE Sensitive     VANCOMYCIN 1 SENSITIVE Sensitive     TRIMETH/SULFA 80 RESISTANT Resistant     CLINDAMYCIN >=8 RESISTANT Resistant     RIFAMPIN <=0.5 SENSITIVE Sensitive     Inducible Clindamycin NEGATIVE Sensitive     * STAPHYLOCOCCUS EPIDERMIDIS  Culture, blood (Routine X 2) w Reflex to ID Panel     Status: None (Preliminary result)   Collection Time: 12/14/20  8:04 AM   Specimen: BLOOD RIGHT HAND  Result Value Ref Range Status   Specimen Description BLOOD RIGHT HAND  Final   Special Requests   Final    BOTTLES DRAWN AEROBIC AND ANAEROBIC Blood Culture adequate volume   Culture   Final    NO GROWTH 4 DAYS Performed at [facility], [address], [place], [place] [address]    Report Status PENDING  Incomplete  Culture, blood (routine x 2)     Status: None (Preliminary result)   Collection Time: 12/16/20  3:07 PM   Specimen: BLOOD LEFT HAND  Result Value Ref Range Status   Specimen Description BLOOD LEFT HAND   Final   Special Requests   Final    BOTTLES DRAWN AEROBIC ONLY Blood Culture results may not be optimal due to an inadequate volume of blood received in culture bottles   Culture   Final    NO GROWTH 2 DAYS Performed at [facility], [place] [address], [place], [place] [address]    Report Status PENDING  Incomplete  Culture, blood (routine x 2)     Status: None (Preliminary result)   Collection Time: 12/16/20  3:16 PM   Specimen: BLOOD RIGHT HAND  Result Value Ref Range Status   Specimen Description BLOOD RIGHT HAND  Final   Special Requests   Final    BOTTLES DRAWN AEROBIC ONLY Blood Culture results may not be optimal due to an inadequate volume of blood received in culture bottles   Culture   Final    NO GROWTH 2 DAYS Performed at [facility], [place] [address] [place], [place] [address]    Report Status PENDING  Incomplete      Studies: No results found.  Scheduled Meds:  arformoterol  15 mcg Nebulization BID   baclofen  5 mg Per Tube TID   budesonide  0.5 mg Nebulization BID   chlorhexidine gluconate (MEDLINE KIT)  15 mL Mouth Rinse BID   Chlorhexidine Gluconate Cloth  6 each Topical Daily   clonazePAM  1 mg Per Tube BID   clopidogrel  75 mg Per Tube Daily   enoxaparin (LOVENOX) injection  0.5 mg/kg Subcutaneous Q24H   famotidine  20 mg Per Tube BID   feeding supplement (PROSource TF)  45 mL Per Tube BID   fiber  1 packet Per Tube BID  free water  200 mL Per Tube Q4H   Gerhardt's butt cream   Topical BID   guaiFENesin  5 mL Per Tube Q6H   hydrocortisone cream   Topical TID   insulin aspart  0-15 Units Subcutaneous Q4H   insulin glargine  40 Units Subcutaneous QHS   levETIRAcetam  2,500 mg Per Tube BID   loratadine  10 mg Per Tube Daily   mouth rinse  15 mL Mouth Rinse QID   midodrine  2.5 mg Per NG tube Q8H   montelukast  10 mg Per Tube Daily   nutrition supplement (JUVEN)  1 packet Per Tube BID   revefenacin  175 mcg Nebulization Daily   tiZANidine  2  mg Per Tube QHS   valproic acid  1,000 mg Per Tube Q12H   Continuous Infusions:  sodium chloride     feeding supplement (JEVITY 1.5 CAL/FIBER) 1,000 mL (12/18/20 0412)   vancomycin Stopped (12/17/20 1003)    Principal Problem:   Sepsis ([place]) Active Problems:   COPD (chronic obstructive pulmonary disease) (HCC)   Chronic diastolic CHF (congestive heart failure) (HCC)   Acute on chronic respiratory failure with hypoxia (HCC)   GERD (gastroesophageal reflux disease)   Seizures (HCC)   Type 2 diabetes mellitus without complication, with long-term current use of insulin (HCC)   Severe hypoxic-ischemic encephalopathy   Tracheostomy dependence ([place])   Pressure injury of skin   Difficult airway   Ventilator dependence ([place])   Acquired tracheal collapse   Anoxic brain injury ([place])   MRSA bacteremia   Consultants: PCCM Palliative medicine Neurology  Procedures: Echocardiogram Replacement of trach  Antibiotics: Cefepime 6/12 through 6/14 Flagyl 6/12 through 6/14 Vancomycin 6/12 through 6/14 Cefepime x1 dose 6/18 Vancomycin x2 doses 6/18, 6/19   Time spent: 45 minutes    Erin Hearing ANP  [facility] 7 am - 330 pm/M-F for direct patient care and secure chat Please refer to Amion for contact info 78  days

## 2020-12-19 DIAGNOSIS — A419 Sepsis, unspecified organism: Secondary | ICD-10-CM | POA: Diagnosis not present

## 2020-12-19 LAB — VANCOMYCIN, TROUGH: Vancomycin Tr: 6 ug/mL — ABNORMAL LOW (ref 15–20)

## 2020-12-19 LAB — CULTURE, BLOOD (ROUTINE X 2)
Culture: NO GROWTH
Special Requests: ADEQUATE

## 2020-12-19 LAB — GLUCOSE, CAPILLARY
Glucose-Capillary: 120 mg/dL — ABNORMAL HIGH (ref 70–99)
Glucose-Capillary: 128 mg/dL — ABNORMAL HIGH (ref 70–99)
Glucose-Capillary: 129 mg/dL — ABNORMAL HIGH (ref 70–99)
Glucose-Capillary: 131 mg/dL — ABNORMAL HIGH (ref 70–99)
Glucose-Capillary: 138 mg/dL — ABNORMAL HIGH (ref 70–99)
Glucose-Capillary: 146 mg/dL — ABNORMAL HIGH (ref 70–99)

## 2020-12-19 MED ORDER — LACTATED RINGERS IV BOLUS
500.0000 mL | Freq: Once | INTRAVENOUS | Status: AC
  Administered 2020-12-19: 500 mL via INTRAVENOUS

## 2020-12-19 MED ORDER — VANCOMYCIN HCL 750 MG/150ML IV SOLN
750.0000 mg | Freq: Two times a day (BID) | INTRAVENOUS | Status: AC
Start: 2020-12-19 — End: 2020-12-21
  Administered 2020-12-19 – 2020-12-21 (×5): 750 mg via INTRAVENOUS
  Filled 2020-12-19 (×5): qty 150

## 2020-12-19 NOTE — Progress Notes (Signed)
PROGRESS NOTE    Sonia Drake  GYI:[id number] DOB: 07-30-1955 DOA: 09/23/2020 PCP: Garwin Brothers, MD   Chief Complain: Brief Narrative: 65 year old female with history of asthma/COPD, CHF, cardiac arrest with PEA/CPR with chronic respiratory failure on chronic trach on supplemental oxygen on 6 L, type 2 diabetes, GERD, seizures, severe hypoxic/ischemic encephalopathy who presented for respiratory distress from skilled nursing facility.  Findings were suggestive of aspiration pneumonitis on presentation.  She decompensated after arrival and required transfer to ICU and initiation of mechanical ventilation.  Hospital course remarkable for persistent vegetative state.  EEG also showed ongoing seizure activity suspected to be from anoxic injury to the brain.  Multiple discussion held with family for goals of care/futility of treatment.  Palliative care, PCCM, ethics committee was consulted.  Still remains full code, family not ready for comfort care.  Assessment & Plan:   Principal Problem:   Sepsis ([place]) Active Problems:   COPD (chronic obstructive pulmonary disease) (HCC)   Chronic diastolic CHF (congestive heart failure) (HCC)   Acute on chronic respiratory failure with hypoxia (HCC)   GERD (gastroesophageal reflux disease)   Seizures (HCC)   Type 2 diabetes mellitus without complication, with long-term current use of insulin (HCC)   Severe hypoxic-ischemic encephalopathy   Tracheostomy dependence (HCC)   Pressure injury of skin   Difficult airway   Ventilator dependence (HCC)   Acquired tracheal collapse   Anoxic brain injury ([place])   MRSA bacteremia   VRE infection (vancomycin resistant Enterococcus)   Klebsiella cystitis   Bacteremia   Acute on chronic respiratory failure with hypoxemia/tracheostomy/vent dependent/tracheomalacia/advanced COPD/asthma: Remains vent dependent.  Hospital course also remarkable for finding of tracheal collapse requiring adjustable trach.  PCCM  following  Anoxic brain injury after cardiac arrest: No meaningful recovery.  Comatose.  Neurology was also following.  Continue low-dose baclofen, Zanaflex.  Has extreme poor quality of life/poor prognosis  Seizure: Neurology was following.  EEG showed seizure activity.  Currently on Keppra, clonazepam, Depakote.  Neurology thinks that this seizure is from anoxic brain injury and did not recommend changing AED because they are not going to help.  Bacteremia/sepsis: Blood cultures sent on 8/24 showed Staph capitis on both sides, currently on vancomycin.  Urine culture had showed Enterococcus faecalis.  ID also following.  Follow-up blood cultures sent on 8/25 showing staph capitis again on 1 bottle only.. Continue vancomycin, started on 8/26, plan for 7 days of therapy Peripheral IV line removed, knee replaced  Intermittent hypotension: Most likely secondary to dysautonomia from brain injury.  Receives intermittent fluid boluses.  On midodrine.  History of CVA: On Plavix.  Currently on vegetative state  Diabetes type 2: Monitor blood sugars.  Hemoglobin A1c of 7  Chronic diastolic congestive heart failure: Currently euvolemic.  Echo had shown grade 1 diastolic dysfunction, hyperdynamic left ventricular systolic function with EF of 70 to 75%  Anemia of chronic disease: Hemoglobin stable around 10        Nutrition Problem: Increased nutrient needs Etiology: wound healing      DVT prophylaxis:Lovenox Code Status: Full Family Communication: [name] at bedside Status is: Inpatient  Remains inpatient appropriate because:Inpatient level of care appropriate due to severity of illness  Dispo: The patient is from: SNF              Anticipated d/c is to: Unknown              Patient currently is not medically stable to d/c.   Difficult to place  patient Yes    Consultants: Palliative care,PCCM    Antimicrobials:  Anti-infectives (From admission, onward)    Start     Dose/Rate  Route Frequency Ordered Stop   12/19/20 2200  vancomycin (VANCOREADY) IVPB 750 mg/150 mL        750 mg 150 mL/hr over 60 Minutes Intravenous Every 12 hours 12/19/20 1026     12/16/20 1000  vancomycin (VANCOCIN) IVPB 1000 mg/200 mL premix  Status:  Discontinued        1,000 mg 200 mL/hr over 60 Minutes Intravenous Every 24 hours 12/15/20 1017 12/19/20 1026   12/15/20 1200  vancomycin (VANCOREADY) IVPB 1500 mg/300 mL        1,500 mg 150 mL/hr over 120 Minutes Intravenous  Once 12/15/20 1045 12/15/20 1407   12/15/20 1115  vancomycin (VANCOREADY) IVPB 1500 mg/300 mL  Status:  Discontinued        1,500 mg 150 mL/hr over 120 Minutes Intravenous  Once 12/15/20 1017 12/15/20 1045   10/07/20 1400  vancomycin (VANCOREADY) IVPB 1500 mg/300 mL  Status:  Discontinued        1,500 mg 150 mL/hr over 120 Minutes Intravenous Every 24 hours 10/07/20 1223 10/09/20 0829   10/07/20 1200  ceFEPIme (MAXIPIME) 2 g in sodium chloride 0.9 % 100 mL IVPB  Status:  Discontinued        2 g 200 mL/hr over 30 Minutes Intravenous Every 8 hours 10/07/20 0828 10/07/20 1221   10/02/20 1400  vancomycin (VANCOREADY) IVPB 1250 mg/250 mL  Status:  Discontinued        1,250 mg 166.7 mL/hr over 90 Minutes Intravenous Every 24 hours 09/24/2020 1313 10/04/20 1221   10/10/2020 2200  ceFEPIme (MAXIPIME) 2 g in sodium chloride 0.9 % 100 mL IVPB  Status:  Discontinued        2 g 200 mL/hr over 30 Minutes Intravenous Every 8 hours 10/13/2020 1313 10/04/20 1221   10/14/2020 2200  metroNIDAZOLE (FLAGYL) IVPB 500 mg  Status:  Discontinued        500 mg 100 mL/hr over 60 Minutes Intravenous Every 8 hours 09/21/2020 1616 10/04/20 1221   10/10/2020 1330  vancomycin (VANCOREADY) IVPB 2000 mg/400 mL        2,000 mg 200 mL/hr over 120 Minutes Intravenous  Once 10/10/2020 1257 10/17/2020 1550   09/30/2020 1300  ceFEPIme (MAXIPIME) 2 g in sodium chloride 0.9 % 100 mL IVPB        2 g 200 mL/hr over 30 Minutes Intravenous  Once 09/28/2020 1257 09/20/2020 1330    10/09/2020 1300  metroNIDAZOLE (FLAGYL) IVPB 500 mg        500 mg 100 mL/hr over 60 Minutes Intravenous  Once 10/11/2020 1257 10/04/2020 1443       Subjective:  Patient seen and examined the bedside this morning.  On ventilator.  Same like before.  Blood pressure is on the higher side.  No new changes   Objective: Vitals:   12/19/20 0734 12/19/20 0751 12/19/20 1042 12/19/20 1207  BP:      Pulse: (!) 123     Resp: (!) 30  (!) 26   Temp:  98.2 F (36.8 C)  97.9 F (36.6 C)  TempSrc:  Axillary  Axillary  SpO2: 98%  98%   Weight:      Height:        Intake/Output Summary (Last 24 hours) at 12/19/2020 1246 Last data filed at 12/19/2020 0700 Gross per 24 hour  Intake 2221.45 ml  Output 2600 ml  Net -378.55 ml   Filed Weights   12/17/20 0337 12/18/20 0500 12/19/20 0352  Weight: 90.7 kg 90.7 kg 91.8 kg    Examination:    General exam: Comatose on vent, unresponsive HEENT: Trach Respiratory system:  no wheezes or crackles  Cardiovascular system: S1 & S2 heard, RRR.  Gastrointestinal system: Abdomen is nondistended, soft and nontender.  PEG Central nervous system: Not alert or awake Extremities: No edema, no clubbing ,no cyanosis Skin: No rashes, no ulcers,no icterus    Data Reviewed: I have personally reviewed following labs and imaging studies  CBC: Recent Labs  Lab 12/13/20 0846 12/17/20 0159  WBC 8.6 6.2  NEUTROABS 5.2 3.4  HGB 10.1* 9.5*  HCT 31.9* 30.9*  MCV 87.4 88.3  PLT 211 185   Basic Metabolic Panel: Recent Labs  Lab 12/13/20 0846 12/17/20 0159  NA 137 138  K 3.8 3.8  CL 104 104  CO2 26 27  GLUCOSE 117* 110*  BUN 20 22  CREATININE 0.41* 0.37*  CALCIUM 10.1 10.1   GFR: Estimated Creatinine Clearance: 72.9 mL/min (A) (by C-G formula based on SCr of 0.37 mg/dL (L)). Liver Function Tests: Recent Labs  Lab 12/13/20 0846  AST 16  ALT 14  ALKPHOS 73  BILITOT 0.3  PROT 5.7*  ALBUMIN 2.4*   No results for input(s): LIPASE, AMYLASE in the  last 168 hours. No results for input(s): AMMONIA in the last 168 hours. Coagulation Profile: No results for input(s): INR, PROTIME in the last 168 hours. Cardiac Enzymes: No results for input(s): CKTOTAL, CKMB, CKMBINDEX, TROPONINI in the last 168 hours. BNP (last 3 results) No results for input(s): PROBNP in the last 8760 hours. HbA1C: No results for input(s): HGBA1C in the last 72 hours. CBG: Recent Labs  Lab 12/18/20 1942 12/18/20 2355 12/19/20 0319 12/19/20 0749 12/19/20 1205  GLUCAP 132* 147* 131* 120* 146*   Lipid Profile: No results for input(s): CHOL, HDL, LDLCALC, TRIG, CHOLHDL, LDLDIRECT in the last 72 hours. Thyroid Function Tests: No results for input(s): TSH, T4TOTAL, FREET4, T3FREE, THYROIDAB in the last 72 hours. Anemia Panel: No results for input(s): VITAMINB12, FOLATE, FERRITIN, TIBC, IRON, RETICCTPCT in the last 72 hours. Sepsis Labs: Recent Labs  Lab 12/13/20 0846  LATICACIDVEN 1.9    Recent Results (from the past 240 hour(s))  Culture, blood (routine x 2)     Status: Abnormal   Collection Time: 12/13/20  8:46 AM   Specimen: BLOOD LEFT HAND  Result Value Ref Range Status   Specimen Description BLOOD LEFT HAND  Final   Special Requests   Final    BOTTLES DRAWN AEROBIC AND ANAEROBIC Blood Culture results may not be optimal due to an inadequate volume of blood received in culture bottles   Culture  Setup Time   Final    GRAM POSITIVE COCCI CRITICAL VALUE NOTED.  VALUE IS CONSISTENT WITH PREVIOUSLY REPORTED AND CALLED VALUE. ANAEROBIC BOTTLE ONLY    Culture (A)  Final    STAPHYLOCOCCUS CAPITIS STAPHYLOCOCCUS EPIDERMIDIS SUSCEPTIBILITIES PERFORMED ON PREVIOUS CULTURE WITHIN THE LAST 5 DAYS. Performed at [facility], [place] [address], [place], [place] [address]    Report Status 12/18/2020 FINAL  Final   Organism ID, Bacteria STAPHYLOCOCCUS CAPITIS  Final      Susceptibility   Staphylococcus capitis - MIC*    CIPROFLOXACIN >=8 RESISTANT  Resistant     ERYTHROMYCIN >=8 RESISTANT Resistant     GENTAMICIN 8 INTERMEDIATE Intermediate  OXACILLIN >=4 RESISTANT Resistant     TETRACYCLINE 2 SENSITIVE Sensitive     VANCOMYCIN <=0.5 SENSITIVE Sensitive     TRIMETH/SULFA <=10 SENSITIVE Sensitive     CLINDAMYCIN >=8 RESISTANT Resistant     RIFAMPIN <=0.5 SENSITIVE Sensitive     Inducible Clindamycin NEGATIVE Sensitive     * STAPHYLOCOCCUS CAPITIS  Culture, blood (routine x 2)     Status: Abnormal (Preliminary result)   Collection Time: 12/13/20  9:04 AM   Specimen: BLOOD RIGHT HAND  Result Value Ref Range Status   Specimen Description BLOOD RIGHT HAND  Final   Special Requests   Final    BOTTLES DRAWN AEROBIC ONLY Blood Culture results may not be optimal due to an inadequate volume of blood received in culture bottles   Culture  Setup Time   Final    GRAM POSITIVE COCCI AEROBIC BOTTLE ONLY CRITICAL RESULT CALLED TO, READ BACK BY AND VERIFIED WITH: PHARMD GREG ABBOTT 12/14/20@05 :55 BY TW    Culture (A)  Final    STAPHYLOCOCCUS CAPITIS SUSCEPTIBILITIES TO FOLLOW Performed at [facility], [address], [place], [place] [address]    Report Status PENDING  Incomplete  Blood Culture ID Panel (Reflexed)     Status: Abnormal   Collection Time: 12/13/20  9:04 AM  Result Value Ref Range Status   Enterococcus faecalis NOT DETECTED NOT DETECTED Final   Enterococcus Faecium NOT DETECTED NOT DETECTED Final   Listeria monocytogenes NOT DETECTED NOT DETECTED Final   Staphylococcus species DETECTED (A) NOT DETECTED Final    Comment: CRITICAL RESULT CALLED TO, READ BACK BY AND VERIFIED WITH: PHARMD GREG ABBOTT 12/14/20@05 :55 BY TW    Staphylococcus aureus (BCID) NOT DETECTED NOT DETECTED Final   Staphylococcus epidermidis DETECTED (A) NOT DETECTED Final    Comment: Methicillin (oxacillin) resistant coagulase negative staphylococcus. Possible blood culture contaminant (unless isolated from more than one blood culture draw or  clinical case suggests pathogenicity). No antibiotic treatment is indicated for blood  culture contaminants. CRITICAL RESULT CALLED TO, READ BACK BY AND VERIFIED WITH: PHARMD GREG ABBOTT 12/14/20@5 :55 BY TW    Staphylococcus lugdunensis NOT DETECTED NOT DETECTED Final   Streptococcus species NOT DETECTED NOT DETECTED Final   Streptococcus agalactiae NOT DETECTED NOT DETECTED Final   Streptococcus pneumoniae NOT DETECTED NOT DETECTED Final   Streptococcus pyogenes NOT DETECTED NOT DETECTED Final   A.calcoaceticus-baumannii NOT DETECTED NOT DETECTED Final   Bacteroides fragilis NOT DETECTED NOT DETECTED Final   Enterobacterales NOT DETECTED NOT DETECTED Final   Enterobacter cloacae complex NOT DETECTED NOT DETECTED Final   Escherichia coli NOT DETECTED NOT DETECTED Final   Klebsiella aerogenes NOT DETECTED NOT DETECTED Final   Klebsiella oxytoca NOT DETECTED NOT DETECTED Final   Klebsiella pneumoniae NOT DETECTED NOT DETECTED Final   Proteus species NOT DETECTED NOT DETECTED Final   Salmonella species NOT DETECTED NOT DETECTED Final   Serratia marcescens NOT DETECTED NOT DETECTED Final   Haemophilus influenzae NOT DETECTED NOT DETECTED Final   Neisseria meningitidis NOT DETECTED NOT DETECTED Final   Pseudomonas aeruginosa NOT DETECTED NOT DETECTED Final   Stenotrophomonas maltophilia NOT DETECTED NOT DETECTED Final   Candida albicans NOT DETECTED NOT DETECTED Final   Candida auris NOT DETECTED NOT DETECTED Final   Candida glabrata NOT DETECTED NOT DETECTED Final   Candida krusei NOT DETECTED NOT DETECTED Final   Candida parapsilosis NOT DETECTED NOT DETECTED Final   Candida tropicalis NOT DETECTED NOT DETECTED Final   Cryptococcus neoformans/gattii NOT  DETECTED NOT DETECTED Final   Methicillin resistance mecA/C DETECTED (A) NOT DETECTED Final    Comment: CRITICAL RESULT CALLED TO, READ BACK BY AND VERIFIED WITH: PHARMD GREG ABBOTT 12/14/20@5 :55 BY TW Performed at [facility] [place], [place] [address], [place], [place] [address]   Urine Culture     Status: Abnormal   Collection Time: 12/13/20  5:27 PM   Specimen: Urine, Catheterized  Result Value Ref Range Status   Specimen Description URINE, CATHETERIZED  Final   Special Requests   Final    NONE Performed at [facility], [address] [place], [place] [address]    Culture (A)  Final    >=100,000 COLONIES/mL ENTEROCOCCUS FAECALIS 30,000 COLONIES/mL KLEBSIELLA PNEUMONIAE VANCOMYCIN RESISTANT ENTEROCOCCUS    Report Status 12/17/2020 FINAL  Final   Organism ID, Bacteria KLEBSIELLA PNEUMONIAE (A)  Final   Organism ID, Bacteria ENTEROCOCCUS FAECALIS (A)  Final      Susceptibility   Enterococcus faecalis - MIC*    AMPICILLIN <=2 SENSITIVE Sensitive     NITROFURANTOIN <=16 SENSITIVE Sensitive     VANCOMYCIN >=32 RESISTANT Resistant     LINEZOLID 2 SENSITIVE Sensitive     * >=100,000 COLONIES/mL ENTEROCOCCUS FAECALIS   Klebsiella pneumoniae - MIC*    AMPICILLIN >=32 RESISTANT Resistant     CEFAZOLIN <=4 SENSITIVE Sensitive     CEFEPIME <=0.12 SENSITIVE Sensitive     CEFTRIAXONE <=0.25 SENSITIVE Sensitive     CIPROFLOXACIN <=0.25 SENSITIVE Sensitive     GENTAMICIN <=1 SENSITIVE Sensitive     IMIPENEM <=0.25 SENSITIVE Sensitive     NITROFURANTOIN 128 RESISTANT Resistant     TRIMETH/SULFA <=20 SENSITIVE Sensitive     AMPICILLIN/SULBACTAM 4 SENSITIVE Sensitive     PIP/TAZO <=4 SENSITIVE Sensitive     * 30,000 COLONIES/mL KLEBSIELLA PNEUMONIAE  Culture, blood (Routine X 2) w Reflex to ID Panel     Status: Abnormal (Preliminary result)   Collection Time: 12/14/20  7:57 AM   Specimen: BLOOD LEFT HAND  Result Value Ref Range Status   Specimen Description BLOOD LEFT HAND  Final   Special Requests   Final    BOTTLES DRAWN AEROBIC AND ANAEROBIC Blood Culture adequate volume   Culture  Setup Time   Final    GRAM POSITIVE COCCI IN CLUSTERS IN BOTH AEROBIC AND ANAEROBIC BOTTLES CRITICAL VALUE NOTED.  VALUE IS  CONSISTENT WITH PREVIOUSLY REPORTED AND CALLED VALUE. Performed at [facility], [place] [address] [place], [place] [address]    Culture (A)  Final    STAPHYLOCOCCUS CAPITIS SUSCEPTIBILITIES TO FOLLOW STAPHYLOCOCCUS EPIDERMIDIS    Report Status PENDING  Incomplete   Organism ID, Bacteria STAPHYLOCOCCUS EPIDERMIDIS  Final      Susceptibility   Staphylococcus epidermidis - MIC*    CIPROFLOXACIN 2 INTERMEDIATE Intermediate     ERYTHROMYCIN >=8 RESISTANT Resistant     GENTAMICIN >=16 RESISTANT Resistant     OXACILLIN >=4 RESISTANT Resistant     TETRACYCLINE 2 SENSITIVE Sensitive     VANCOMYCIN 1 SENSITIVE Sensitive     TRIMETH/SULFA 80 RESISTANT Resistant     CLINDAMYCIN >=8 RESISTANT Resistant     RIFAMPIN <=0.5 SENSITIVE Sensitive     Inducible Clindamycin NEGATIVE Sensitive     * STAPHYLOCOCCUS EPIDERMIDIS  Culture, blood (Routine X 2) w Reflex to ID Panel     Status: None   Collection Time: 12/14/20  8:04 AM   Specimen: BLOOD RIGHT HAND  Result Value Ref Range Status  Specimen Description BLOOD RIGHT HAND  Final   Special Requests   Final    BOTTLES DRAWN AEROBIC AND ANAEROBIC Blood Culture adequate volume   Culture   Final    NO GROWTH 5 DAYS Performed at [facility], [address], [place], [place] [address]    Report Status 12/19/2020 FINAL  Final  Culture, blood (routine x 2)     Status: None (Preliminary result)   Collection Time: 12/16/20  3:07 PM   Specimen: BLOOD LEFT HAND  Result Value Ref Range Status   Specimen Description BLOOD LEFT HAND  Final   Special Requests   Final    BOTTLES DRAWN AEROBIC ONLY Blood Culture results may not be optimal due to an inadequate volume of blood received in culture bottles   Culture   Final    NO GROWTH 3 DAYS Performed at [facility], [place] [address] [place], [place] [address]    Report Status PENDING  Incomplete  Culture, blood (routine x 2)     Status: None (Preliminary result)   Collection Time:  12/16/20  3:16 PM   Specimen: BLOOD RIGHT HAND  Result Value Ref Range Status   Specimen Description BLOOD RIGHT HAND  Final   Special Requests   Final    BOTTLES DRAWN AEROBIC ONLY Blood Culture results may not be optimal due to an inadequate volume of blood received in culture bottles   Culture   Final    NO GROWTH 3 DAYS Performed at [facility], [place] [address] [place], [place] [address]    Report Status PENDING  Incomplete  MRSA Next Gen by PCR, Nasal     Status: None   Collection Time: 12/18/20  1:16 PM   Specimen: Nasal Mucosa; Nasal Swab  Result Value Ref Range Status   MRSA by PCR Next Gen NOT DETECTED NOT DETECTED Final    Comment: (NOTE) The GeneXpert MRSA Assay (FDA approved for NASAL specimens only), is one component of a comprehensive MRSA colonization surveillance program. It is not intended to diagnose MRSA infection nor to guide or monitor treatment for MRSA infections. Test performance is not FDA approved in patients less than 40 years old. Performed at [facility], [place] [address], [place], [place] [address]          Radiology Studies: No results found.      Scheduled Meds:  arformoterol  15 mcg Nebulization BID   baclofen  5 mg Per Tube TID   budesonide  0.5 mg Nebulization BID   chlorhexidine gluconate (MEDLINE KIT)  15 mL Mouth Rinse BID   Chlorhexidine Gluconate Cloth  6 each Topical Daily   clonazePAM  1 mg Per Tube BID   clopidogrel  75 mg Per Tube Daily   enoxaparin (LOVENOX) injection  0.5 mg/kg Subcutaneous Q24H   famotidine  20 mg Per Tube BID   feeding supplement (PROSource TF)  45 mL Per Tube BID   fiber  1 packet Per Tube BID   free water  200 mL Per Tube Q4H   Gerhardt's butt cream   Topical BID   guaiFENesin  5 mL Per Tube Q6H   hydrocortisone cream   Topical TID   insulin aspart  0-15 Units Subcutaneous Q4H   insulin glargine  40 Units Subcutaneous QHS   levETIRAcetam  2,500 mg Per Tube BID   loratadine  10 mg  Per Tube Daily   mouth rinse  15 mL Mouth  Rinse QID   midodrine  2.5 mg Per NG tube Q8H   montelukast  10 mg Per Tube Daily   revefenacin  175 mcg Nebulization Daily   tiZANidine  2 mg Per Tube QHS   valproic acid  1,000 mg Per Tube Q12H   Continuous Infusions:  sodium chloride     feeding supplement (JEVITY 1.5 CAL/FIBER) 1,000 mL (12/19/20 0609)   vancomycin       LOS: 79 days    Time spent: 35 mins,More than 50% of that time was spent in counseling and/or coordination of care.      Shelly Coss, MD [facility] P8/30/2022, 12:46 PM

## 2020-12-19 NOTE — Progress Notes (Signed)
Pharmacy Antibiotic Note  Sonia Drake is a 65 y.o. female admitted on 2020-10-19 with bacteremia.  Pharmacy has been consulted for Vancomycin dosing.  Blood cultures ID panel on 8/24 showed S. Epidermitis with MecA gene for methicillin resistance. It was believed to be a contaminant, so blood cultures were redrawn on 8/25, showing growth  in 1/2 samples. Per ID recommendations, will proceed  with vancomycin treatment. ID recommends 7 total days of therapy.  Vancomycin peak of 27 and trough of 6 on 1000 mg every 24 hours. Patients actual AUC on this dosing regimen was calculated to be 339, which is below the target of 400-600. VD 41L, vancomycin clearance of 49.1 mL/min. Will change to vancomycin 750 mg every 12 hours to achieve AUC of 509 based off of 2-level kinetics. Calculated cmax of 30.5 and cmin of 13.9. Patient remains afebrile. No WBC collected in 2 days.  Plan: Change to 750 mg vancomycin every 12 hours on 8/27 to provide estimated AUC of 509 using peak of 27 and trough of 6.  Height: 5\' 2"  (157.5 cm) Weight: 91.8 kg (202 lb 6.1 oz) IBW/kg (Calculated) : 50.1  Temp (24hrs), Avg:98.4 F (36.9 C), Min:97.8 F (36.6 C), Max:99 F (37.2 C)  Recent Labs  Lab 12/13/20 0846 12/17/20 0159 12/18/20 1201 12/19/20 0857  WBC 8.6 6.2  --   --   CREATININE 0.41* 0.37*  --   --   LATICACIDVEN 1.9  --   --   --   VANCOTROUGH  --   --   --  6*  VANCOPEAK  --   --  27*  --      Estimated Creatinine Clearance: 72.9 mL/min (A) (by C-G formula based on SCr of 0.37 mg/dL (L)).    Allergies  Allergen Reactions   Contrast Media [Iodinated Diagnostic Agents] Anaphylaxis and Other (See Comments)    ** CARDIAC ARREST **   Peanuts [Peanut Oil] Shortness Of Breath and Swelling   Shellfish Allergy Shortness Of Breath and Swelling   Soap Shortness Of Breath and Swelling    LAUNDRY DETERGENT   Latex Hives   Adenosine    Eggs Or Egg-Derived Products Rash    Antimicrobials this  admission: Cefepime 6/12 >>6/18 Metronidazole 6/12 >> 6/15 Vancomycin 6/13>>6/20, 8/26>>   Microbiology results: 8/25 BCx: 1/2 G+ cocci 8/24 Bcx: Staphylococcus epidermitis 8/24 UCx: K. Pneumoniae. E. faecalis  Thank you for allowing pharmacy to participate in this patient's care.  9/24, PharmD PGY1 Pharmacy Resident 12/19/2020 10:15 AM Check [email or web address] for unit specific pharmacy number

## 2020-12-19 NOTE — Progress Notes (Addendum)
Nutrition Follow-up  DOCUMENTATION CODES:   Not applicable  INTERVENTION:   Continue tube feeds via PEG: - Jevity 1.5 @ 45 ml/hr (1080 ml/day) - ProSource TF 45 ml BID - Free water flushes of 200 ml q 4 hours   Tube feeding regimen provides 1700 kcal, 91 grams of protein, and 821 ml of H2O.   Total free water with flushes: 2280 ml   - d/c Juven  NUTRITION DIAGNOSIS:   Increased nutrient needs related to wound healing as evidenced by estimated needs.  Ongoing, being addressed via TF  GOAL:   Patient will meet greater than or equal to 90% of their needs  Met via TF  MONITOR:   Vent status, Labs, Weight trends, TF tolerance, Skin, I & O's  REASON FOR ASSESSMENT:   Consult Enteral/tube feeding initiation and management  ASSESSMENT:   Sonia Drake is a 65 y.o. female with medical history significant of asthma/COPD, CHF, cardiac arrest with PEA/CPR with chronic respiratory failure on permanent trach with supplemental oxygen at 6L, type 2 DM, GERD, seizures and severe hypoxic-ischemic encephalopathy who presented to ER via EMS for respiratory distress from SNF.  Discussed pt with RN and during ICU rounds. Pt tolerating tube feeds without issue. Weight stable.  Current TF: Jevity 1.5 @ 45 ml/hr, ProSource TF 45 ml BID, free water flushes 200 ml q 4 hours  Admit weight: 88.4 kg Current weight: 91.8 kg  Pt with +2 pitting edema to BUE and +1 pitting edema to BLE.  Patient remains on ventilator support via trach MV: 8.8 L/min Temp (24hrs), Avg:98.6 F (37 C), Min:98.1 F (36.7 C), Max:99 F (37.2 C)  Medications reviewed and include: pepcid, nutrisource fiber BID, SSI q 4 hours, lantus 40 units daily, Juven BID, IV abx  Labs reviewed: hemoglobin 9.5 CBG's: 120-147 x 24 hours  UOP: 2600 ml x 24 hours  Diet Order:   Diet Order             Diet NPO time specified  Diet effective now                   EDUCATION NEEDS:   No education needs have been  identified at this time  Skin:  Skin Assessment: Skin Integrity Issues: Other: MASD to buttocks  Last BM:  12/20/31 type 6  Height:   Ht Readings from Last 1 Encounters:  10/12/20 5' 2"  (1.575 m)    Weight:   Wt Readings from Last 1 Encounters:  12/19/20 91.8 kg    Ideal Body Weight:  45.5 kg  BMI:  Body mass index is 37.02 kg/m.  Estimated Nutritional Needs:   Kcal:  1600-1800  Protein:  90-105 grams  Fluid:  > 1.6 L    Gustavus Bryant, MS, RD, LDN Inpatient Clinical Dietitian Please see AMiON for contact information.

## 2020-12-19 NOTE — Progress Notes (Signed)
NAME:  Sonia Drake, MRN:  [id number], DOB:  1955-06-16, LOS: 79 ADMISSION DATE:  19-Oct-2020, CONSULTATION DATE:  6/15 REFERRING MD:  Thedore Mins, CHIEF COMPLAINT:  Dyspnea   History of Present Illness:  65 y/o female with anoxic brain injury, tracheostomy status who was admitted from a SNF on 6/12 with worsening respiratory failure.  She had previously been ventilator dependent but had been weaned from the trach.  On 6/16 a bronchoscopy revealed dynamic collapse of her trachea, tracheostomy was replaced.  Later required mechanical ventilation.  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 No distress. WBC ct trending down. No events overnight growing GPC clusters in two of two cultures  6/16 increased work of breathing, upper airway noise, abdominal muscle use.  Bronchoscopy performed as trach exchange cuffless #8 Shiley XLT showed dynamic airway collapse, tracheal collapse. 6/16 continued respiratory distress moved to ICU, changed to cuffed #6 Shiley XLT, placed on MV with some improved comfort 6/12 cefepime > 6/14, 6/18 6/12 vanc > 6/14, 6/18 6/23 mucus plugging, severe vent dyssynchrony 6/24-25 intermittent jerking, seizures on EEG 6/27 weaned off propofol gtt 6/28 briefly tolerated trach collar in AM 6/29 tolerated trach collar in AM; no further seizure like activity; working with CM/SW for vent-SNF 7/1 Weaning on CPAP/ PS 10/5, CM SW working on [employer], NO seizure activity, secretions are not an issue per  nursing 7/11 trach changed to #8 Bivona with air cuff, much improved. 7/12 TCT 12 hours; rested on Vent overnight 7/13 on TCT 7/23 Trach collar tolerated 7/24 Full vent support 7/26>> Remains on full vent support this am, no issues with secretions per nursing 8/5 occluding trach with dynamic collapse: seen on bronchoscopy 8/6 called back emergently to bedside for tracheostomy occlusion: emergent bronch, bivona trach migrated back 2cm, improved with advancing to 2cm above carina 8/8 collapse caused hypoxia. Improved with repositioning  8/9 afternoon had another episode of tracheal collapse and occlusion of trach requiring BVM, sedation. Family produced paperwork which seems to have conflicting information about pts wishes  8/11: pt continues to have multiple daily episodes of tracheal collapse/mucous plugging req lavage, BVM. 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 Neuro consulted 8/30 overnight , BP drop which responded to fluid bolus  Interim History / Subjective:   No more episodes for resp distress, no plugging per nursing BP drop overnight that responded to fluid bolus Noted to have myoclonic seizures 8/22. Neuro seeing and has added additional seizure medication. Per nursing no obvious seizures  Objective   Blood pressure (!) 168/116, pulse (!) 123, temperature 98.2 F (36.8 C), temperature source Axillary, resp. rate (!) 30, height 5\' 2"  (1.575 m), weight 91.8 kg, SpO2 98 %.    Vent Mode: PRVC FiO2 (%):  [30 %] 30 % Set Rate:  [16 bmp] 16 bmp Vt Set:  [400 mL] 400 mL PEEP:  [5 cmH20] 5 cmH20 Plateau Pressure:  [14 cmH20-22 cmH20] 22 cmH20   Intake/Output Summary (Last 24 hours) at 12/19/2020 1026 Last data filed at 12/19/2020 0700 Gross  per 24 hour  Intake 2511.45 ml  Output 2600 ml  Net -88.55 ml   Filed Weights   12/17/20 0337 12/18/20 0500 12/19/20 0352  Weight: 90.7 kg  90.7 kg 91.8 kg   Physical Exam: Gen:      No acute distress, chronically ill appearing vented female HEENT:  EOMI, sclera anicteric Neck:     No masses; no thyromegaly, trach, , No JVD or LAD Lungs:    Bilateral chest excursion; normal respiratory effort, rhonchi CV:         Regular rate and rhythm; no murmurs Abd:      + bowel sounds; soft, non-tender; no palpable masses, no distension Ext:    Trace edema; adequate peripheral perfusion Skin:      Warm and dry; no rash, intact Neuro:  Unresponsive, comatose  Resolved Hospital Problem list     Assessment & Plan:   Anoxic encephalopathy / anoxic brain injury Acute on chronic respiratory failure w hypercarbia and hypoxia  Severe tracheobronchomalacia Trach status Fewer episodes of desaturations with movement Minimal secretions per nursing Hx COPD  -Recurrent episodes of desats with hyperdynamic collapse of airway in spite of increasing PEEP and repositioning trach -etiology of trach occlusion, dynamic airway collapse. Associated severe dyssynchrony  Plan: Respiratory status appears to have stabilized with no more episodes of desaturation since weekend Now with status myoclonic seizures, poor prognosis for recovery She is in persistently vegetative state Avoid pressure support weans as she is unlikely to be liberated from the ventilator Ongoing discussions noted with family regarding goals of care Continue bivona trach , BD , nebs mucolytics , Singulair  Pulmonary will continue to intermittently follow   Bevelyn Ngo, MSN, AGACNP-BC  Pulmonary/Critical Care Medicine See Amion for personal pager If no response to pager , please call [phone] until 7pm After 7:00 pm call Elink  [phone] 12/19/2020, 10:26 AM

## 2020-12-19 NOTE — Progress Notes (Signed)
eLink Physician-Brief Progress Note Patient Name: Sonia Drake DOB: 1955-11-12 MRN: [id number]   Date of Service  12/19/2020  HPI/Events of Note  SBP 72/31, MAP 45.  eICU Interventions  Lr 500 ml iv bolus ordered.        Thomasene Lot Rik Wadel 12/19/2020, 12:57 AM

## 2020-12-20 ENCOUNTER — Inpatient Hospital Stay: Payer: Self-pay

## 2020-12-20 DIAGNOSIS — R7881 Bacteremia: Secondary | ICD-10-CM | POA: Diagnosis not present

## 2020-12-20 DIAGNOSIS — G931 Anoxic brain damage, not elsewhere classified: Secondary | ICD-10-CM | POA: Diagnosis not present

## 2020-12-20 DIAGNOSIS — J398 Other specified diseases of upper respiratory tract: Secondary | ICD-10-CM | POA: Diagnosis not present

## 2020-12-20 DIAGNOSIS — J9621 Acute and chronic respiratory failure with hypoxia: Secondary | ICD-10-CM | POA: Diagnosis not present

## 2020-12-20 LAB — GLUCOSE, CAPILLARY
Glucose-Capillary: 57 mg/dL — ABNORMAL LOW (ref 70–99)
Glucose-Capillary: 82 mg/dL (ref 70–99)
Glucose-Capillary: 109 mg/dL — ABNORMAL HIGH (ref 70–99)
Glucose-Capillary: 134 mg/dL — ABNORMAL HIGH (ref 70–99)
Glucose-Capillary: 84 mg/dL (ref 70–99)
Glucose-Capillary: 105 mg/dL — ABNORMAL HIGH (ref 70–99)
Glucose-Capillary: 107 mg/dL — ABNORMAL HIGH (ref 70–99)

## 2020-12-20 LAB — CULTURE, BLOOD (ROUTINE X 2)

## 2020-12-20 MED ORDER — ORAL CARE MOUTH RINSE
15.0000 mL | OROMUCOSAL | Status: DC
  Administered 2020-12-20 – 2021-01-20 (×313): 15 mL via OROMUCOSAL

## 2020-12-20 MED ORDER — ORAL CARE MOUTH RINSE
15.0000 mL | OROMUCOSAL | Status: DC
  Administered 2020-12-20 (×3): 15 mL via OROMUCOSAL

## 2020-12-20 NOTE — TOC Progression Note (Addendum)
Transition of Care [facility]) - Progression Note    Patient Details  Name: Sonia Drake MRN: [id number] Date of Birth: 09-05-1955  Transition [facility]) CM/SW Contact  Janae Bridgeman, RN Phone Number: 12/20/2020, 1:25 PM  Clinical Narrative:    CM with DTP Team will continue to follow the patient for transitions of care needs.  Clinicals were resent to Prem, CM at [facility] requesting review for bed offer at the facility.   Expected Discharge Plan: Skilled Nursing Facility Barriers to Discharge: Continued Medical Work up  Expected Discharge Plan and Services Expected Discharge Plan: Skilled Nursing Facility   Discharge Planning Services: CM Consult Post Acute Care Choice: Skilled Nursing Facility                                         Social Determinants of Health (SDOH) Interventions    Readmission Risk Interventions No flowsheet data found.

## 2020-12-20 NOTE — Progress Notes (Addendum)
ID Brief Note   Repeat blood cultures 8/27 No growth in 4 days  Susceptibilities of staph capitis and Staph epidermidis reviewed, possibly a contaminant given two different organisms in a patient who is hardstick and no concerns of infection clinically Plan to continue Vancomycin for 7 days total given Left THA/? Phlebitis  ID will sign off for now. Please call with questions   Odette Fraction, MD Infectious Disease Physician [facility] [address] [place], [place] [address] Phone: [phone]  Fax: [phone]

## 2020-12-20 NOTE — Progress Notes (Signed)
[facility] PROGRESS NOTE  Sonia Drake ZOX:[id number] DOB: Feb 16, 1956 DOA: 10/08/2020 PCP: Garwin Brothers, MD  Status: Remains inpatient appropriate because:Altered mental status, Unsafe d/c plan, and Inpatient level of care appropriate due to severity of illness  Dispo: The patient is from: SNF              Anticipated d/c is to: SN[facility] but due to the fact she has an adjustable trach and given her recurrent airway issues as described above they have declined to take her this particular facility.                Patient currently is not medically stable to d/c.   Difficult to place patient Yes   Level of care: ICU  Code Status: Full-pulmonary medicine documents paperwork that presents some conflicting information about the patient's wishes Family Communication: Spoke with patient's daughter in detail on 8/17.  8/26 updated daughter on new diagnosis of MRSE bacteremia. DVT prophylaxis: Lovenox COVID vaccination status: Per H&P patient is not vaccinated   The following pertains to patient's living will dated June 22nd, 2016: 1) no sedative medications to keep her asleep.  She wants to be fully awake regardless of pain 2) she requested all means to keep her alive.  She wants to remain on life support until her daughter, Sonia Drake and the grandchildren decide otherwise after consulting with the medical team 3) she also hand wrote that she wishes to be allowed 82 to 90 days to see if she can pull through  HPI: 65 y/o female with medical history significant of asthma/COPD, CHF, cardiac arrest with PEA/CPR with chronic respiratory failure on permanent trach with supplemental oxygen at 6L, type 2 DM, GERD, seizures and severe hypoxic-ischemic encephalopathy who presented to ER via EMS for respiratory distress from SNF.  She presented with findings consistent with acute aspiration pneumonitis.  She decompensated after arrival and  required transfer to ICU and initiation of mechanical ventilation.  She had sepsis physiology presumed secondary to [facility] bacteremia but it was later determined that the blood culture results were related to contaminants and patient was no longer determined to be septic   She had previously been ventilator dependent but had been weaned from the ventilator briefly during her stay. On 6/16 a bronchoscopy revealed dynamic collapse of her trachea, tracheostomy was changed from 8.0 XLT cuffless to 6.0 XLT cuffed. She now is requiring chronic ventilatory support 2/2 advanced COPD/asthma and tracheobronchomalacia. PCCM physicians updated patient's daughter on status and poor prognosis along with recommendation for chronic vent.  Palliative medicine has had multiple discussions with the daughter.  Daughter continues to request aggressive care.  Unfortunately she continues to have multiple daily events regarding tracheal collapse with mucous plugging requiring excessive lavage and suction as well as bag-valve-mask for ventilation  Patient has had reemergence of continuous seizure activity.  Initially detected by noting patient with rhythmic blinking.  Continuous EEG revealed ongoing seizure activity.  On 8/23 during exam with sternal rub patient had what appeared to be decerebrate posturing, eyes were opened and noted with sluggish response to pupils.  Not long after eyes were opened for examination patient began having forceful, rhythmic spasms of the bilateral face that appeared consistent with seizure activity.  I returned to her room 1 hour later and her eyes were open and she was blinking constantly.  She had no blink reflex when tested.  She had another facial twitch that appeared consistent  with seizure activity.  RN at bedside and witnessed these findings.  Significant Hospital events: 6/12 admitted from skilled nursing facility after aspiration event and copious secretions per tracheostomy 6/13 appears  comfortable currently on 35% ATC. No distress. WBC ct trending down. No events overnight growing GPC clusters in two of two cultures  6/16 increased work of breathing, upper airway noise, abdominal muscle use.  Bronchoscopy performed as trach exchange cuffless #8 Shiley XLT showed dynamic airway collapse, tracheal collapse. 6/16 continued respiratory distress moved to ICU, changed to cuffed #6 Shiley XLT, placed on MV with some improved comfort 6/12 cefepime > 6/14, 6/18 6/12 vanc > 6/14, 6/18 6/23 mucus plugging, severe vent dyssynchrony 6/24-25 intermittent jerking, seizures on EEG 6/27 weaned off propofol gtt 6/28 briefly tolerated trach collar in AM 6/29 tolerated trach collar in AM; no further seizure like activity; working with CM/SW for vent-SNF 7/1 Weaning on CPAP/ PS 10/5, CM SW working on [employer], NO seizure activity, secretions are not an issue per nursing 7/11 trach changed to #8 Bivona with air cuff, much improved. 7/12 TCT 12 hours; rested on Vent overnight 7/13 on TCT 7/23 Trach collar tolerated 7/24 Full vent support 7/26>> Remains on full vent support this am, no issues with secretions per nursing 8/5 occluding trach with dynamic collapse: seen on bronchoscopy 8/6 called back emergently to bedside for tracheostomy occlusion: emergent bronch, bivona trach migrated back 2cm, improved with advancing to 2cm above carina 8/8 collapse caused hypoxia. Improved with repositioning  8/9 afternoon had another episode of tracheal collapse and occlusion of trach requiring BVM, sedation. Family produced paperwork which seems to have conflicting information about pts wishes  8/11: pt continues to have multiple daily episodes of tracheal collapse/mucous plugging req lavage, BVM. Repeated discussions with family re: code status esp in light of paperwork  8/15 consultation placed with ethics committee to evaluate regarding futility of care 8/17 spoke at length with patient's daughter regarding  futility of care CODE STATUS.  Daughter continues to desire aggressive measures. 8/26 through 8/27 blood cultures positive for MDR staph epidermis/capitis and is currently on IV vancomycin 8/28 urine culture positive for VRE and Klebsiella with of which are considered colonization and not active infection.  Subjective: Patient responsive only to pain. Subtly withdrew right side to nailbed pressure as well as partially open eyes.   Objective: Vitals:   12/20/20 0405 12/20/20 0430  BP:  (!) 158/110  Pulse:  (!) 104  Resp:  (!) 24  Temp: 98.9 F (37.2 C)   SpO2:  100%    Intake/Output Summary (Last 24 hours) at 12/20/2020 0755 Last data filed at 12/20/2020 0400 Gross per 24 hour  Intake 1025.19 ml  Output 1801 ml  Net -775.81 ml   Filed Weights   12/18/20 0500 12/19/20 0352 12/20/20 0500  Weight: 90.7 kg 91.8 kg 92.8 kg    Exam:  Constitutional: Unresponsive except to extreme painful stimulus Respiratory: 8.0 cuffed Bivona air trach, ventilator settings: PRVC mode,VT 400, Rate 16, PEEP 5, PS 15, FiO2 30%; Lungs CTA .  Does have some respiratory effort over the set ventilatory rate. Cardiovascular: SR/ST,, stable peripheral edema, normotensive on midodrine Abdomen: Nondistended with normoactive bowel sounds. PEG tube for feedings. LBM 8/31 Skin: Stage II decubitus consistent with several mild areas of excoriation without any periwound erythema. Neurologic: Remains unresponsive.  Withdraws right side to painful stimulus applied to nailbeds.  Partial opening of eyes with deep painful stimulus.  Pupils:4 mm with very sluggish reaction Psychiatric:  Eyes closed.  Unresponsive    Assessment/Plan: Acute problems: Acute on chronic respiratory failure w/ hypoxemia Tracheostomy/vent dependence 2/2 Tracheobronchomalcia and advanced COPD/asthma Not a candidate to wean from ventilator or decannulation secondary to altered mental status as well as continued issues with tracheal collapse  requiring adjustable trach PCCM physician spoke at length with daughter about chronicity of these medical problems and that patient will always be ventilator dependent.  Daughter opted to continue current aggressive care.   I also spoke at length with daughter on 8/17 and she wishes to continue aggressive care  Patient continues to have issues with intermittent breath stacking as well as tracheal collapse.  Most times when given Ativan develops reflexive hypotension. (See below) Has not tolerated PS mode given above issues therefore no further attempts will be made to utilize this modality.  Known anoxic brain injury after cardiac arrest  Patient without any meaningful improvement.  Currently she continues to have underlying seizure activity as evidenced by rhythmic eye blinking and focal twitching in face and eyes. 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 Prognosis extremely poor and at this juncture it appears we are approaching futility of care.  Palliative medicine has reached out to the family on multiple occasions but they continue to wish for aggressive care.  Appreciate assistance of ethics team regarding futility of care concerns.  Discussed with palliative care team and given multiple interactions with patient's family at this juncture they feel they have nothing to offer and agree with ethics committee recommendations for pursuing futility of care.  Patient's family would like to continue care at least to the 90-day mark which will be in 16 days from today.  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 likely due to h/o anoxic injury. (per Dr Hortense Ramal) LTM obtained oon 8/19-20 showed continued seizure spikes, attending physician over the weekend spoke with Dr. Hortense Ramal on 8/21 -Per neurologist: "Of note, patient has been in persistent vegetative state and I do not believe that treating the seizures will lead to any improvement in patient's outcome.  I would favor not aggressively treating them with antiepileptic medications as this would likely lead to sedation"  History of cardiac arrest Review of outpatient cardiology documents from 09/08/2019 revealed no evidence of CAD Patient was treated for cardiac arrest 2/2 acute hypoxia in August 2021 at [facility].  According to EMS records during that admission patient called 911 in context of respiratory failure.  She was gasping for air and then became unresponsive.  Upon EMS arrival patient was pulseless and apneic.  MDR bacteremia secondary to staph epidermis/capitis/colonization of VRE and Klebsiella in urine -T-max 99 prompting infectious work-up -Blood cultures obtained in 2/2 positive for gram-positive cocci with 1 set with staph epidermis that is methicillin-resistant based on [facility] ID-repeat blood cultures also growing gram-positive cocci -ID has initiated vancomycin as of 8/26-plan is for 7 days of therapy pending clearance of organism from blood culture.  Second blood cultures from 8/25 also positive.  Additional set 8/27 x4 days -Continue IV vancomycin for now. -PIV will be removed and new IV placed-this  is likely source of staph epidermis -Very difficult stick which led to PIV being left in place for 40 days.  Have reached out to the IV team to assist in choice of access either midline or PICC.  Intermittent hypotension secondary to dysautonomia from brain injury -Patient developed significant hypotension after  administration of Ativan to treat breath stacking while on ventilator -Initially has been given boluses but at risk for volume overload given hypoalbuminemia -Continue midodrine 2.5 mg every 8 hrs. this a.m. SBP 130 so have changed order to hold dose if SBP >/= 130  History of CVA Continue Plavix   Hyponatremia Resolved   Diabetes mellitus 2 on long-term insulin with hyperglycemia Continue Lantus and SSI Hemoglobin A1c 7.0 Current CBGs well-controlled   Hypertension Blood pressure times has been suboptimal therefore beta-blocker discontinued.   Chronic diastolic dysfunction Echocardiogram this admission with grade 1 diastolic dysfunction, moderate LV hypertrophy hyperdynamic LV systolic function Currently euvolemic Monitor I/O Continue as needed beta-blocker for elevated heart rate -SBP remains suboptimal Not on ACE or ARB secondary to suboptimal blood pressure reading   Chronic protein malnutrition secondary to sequelae from anoxic brain injury/PEG tube dependence Continue current tube feedings with [employer] Nutrition Problem: Increased nutrient needs Etiology: wound healing Signs/Symptoms: estimated needs Interventions: Tube feeding Body mass index is 37.42 kg/m.   Anemia of chronic disease Hemoglobin remained stable around 10  Stage II sacral decubitus Wound / Incision (Open or Dehisced) 10/31/20 (MASD) Moisture Associated Skin Damage Buttocks Bilateral (Active)  Date First Assessed/Time First Assessed: 10/31/20 2100   Wound Type: (MASD) Moisture Associated Skin Damage  Location: Buttocks  Location Orientation: Bilateral  Present on Admission: No    Assessments 10/31/2020  9:20 PM 12/19/2020  8:00 PM  Dressing Type None;Other (Comment) Foam - Lift dressing to assess site every shift  Dressing Changed -- Changed  Dressing Status -- Clean;Dry;Intact  Dressing Change Frequency -- Twice a day  Site / Wound Assessment Pink --  Peri-wound Assessment -- Intact  Treatment  Cleansed --     No Linked orders to display       Data Reviewed: Basic Metabolic Panel: Recent Labs  Lab 12/13/20 0846 12/17/20 0159  NA 137 138  K 3.8 3.8  CL 104 104  CO2 26 27  GLUCOSE 117* 110*  BUN 20 22  CREATININE 0.41* 0.37*  CALCIUM 10.1 10.1   Liver Function Tests: Recent Labs  Lab 12/13/20 0846  AST 16  ALT 14  ALKPHOS 73  BILITOT 0.3  PROT 5.7*  ALBUMIN 2.4*     No results for input(s): LIPASE, AMYLASE in the last 168 hours. No results for input(s): AMMONIA in the last 168 hours. CBC: Recent Labs  Lab 12/13/20 0846 12/17/20 0159  WBC 8.6 6.2  NEUTROABS 5.2 3.4  HGB 10.1* 9.5*  HCT 31.9* 30.9*  MCV 87.4 88.3  PLT 211 206   Cardiac Enzymes: No results for input(s): CKTOTAL, CKMB, CKMBINDEX, TROPONINI in the last 168 hours. BNP (last 3 results) Recent Labs    10/04/20 0051 10/05/20 0049 10/06/20 0548  BNP 145.8* 115.3* 125.4*    ProBNP (last 3 results) No results for input(s): PROBNP in the last 8760 hours.  CBG: Recent Labs  Lab 12/19/20 1532 12/19/20 1923 12/19/20 2357 12/20/20 0354 12/20/20 0356  GLUCAP 129* 128* 138* 57* 82    Recent Results (from the past 240 hour(s))  Culture, blood (routine x 2)     Status: Abnormal   Collection Time: 12/13/20  8:46  AM   Specimen: BLOOD LEFT HAND  Result Value Ref Range Status   Specimen Description BLOOD LEFT HAND  Final   Special Requests   Final    BOTTLES DRAWN AEROBIC AND ANAEROBIC Blood Culture results may not be optimal due to an inadequate volume of blood received in culture bottles   Culture  Setup Time   Final    GRAM POSITIVE COCCI CRITICAL VALUE NOTED.  VALUE IS CONSISTENT WITH PREVIOUSLY REPORTED AND CALLED VALUE. ANAEROBIC BOTTLE ONLY    Culture (A)  Final    STAPHYLOCOCCUS CAPITIS STAPHYLOCOCCUS EPIDERMIDIS SUSCEPTIBILITIES PERFORMED ON PREVIOUS CULTURE WITHIN THE LAST 5 DAYS. Performed at [facility], [place] [address] [place], [place] [address]    Report  Status 12/18/2020 FINAL  Final   Organism ID, Bacteria STAPHYLOCOCCUS CAPITIS  Final      Susceptibility   Staphylococcus capitis - MIC*    CIPROFLOXACIN >=8 RESISTANT Resistant     ERYTHROMYCIN >=8 RESISTANT Resistant     GENTAMICIN 8 INTERMEDIATE Intermediate     OXACILLIN >=4 RESISTANT Resistant     TETRACYCLINE 2 SENSITIVE Sensitive     VANCOMYCIN <=0.5 SENSITIVE Sensitive     TRIMETH/SULFA <=10 SENSITIVE Sensitive     CLINDAMYCIN >=8 RESISTANT Resistant     RIFAMPIN <=0.5 SENSITIVE Sensitive     Inducible Clindamycin NEGATIVE Sensitive     * STAPHYLOCOCCUS CAPITIS  Culture, blood (routine x 2)     Status: Abnormal (Preliminary result)   Collection Time: 12/13/20  9:04 AM   Specimen: BLOOD RIGHT HAND  Result Value Ref Range Status   Specimen Description BLOOD RIGHT HAND  Final   Special Requests   Final    BOTTLES DRAWN AEROBIC ONLY Blood Culture results may not be optimal due to an inadequate volume of blood received in culture bottles   Culture  Setup Time   Final    GRAM POSITIVE COCCI AEROBIC BOTTLE ONLY CRITICAL RESULT CALLED TO, READ BACK BY AND VERIFIED WITH: PHARMD GREG ABBOTT 12/14/20@05 :55 BY TW    Culture (A)  Final    STAPHYLOCOCCUS CAPITIS SUSCEPTIBILITIES TO FOLLOW Performed at [facility], [address], [place],  [address]    Report Status PENDING  Incomplete  Blood Culture ID Panel (Reflexed)     Status: Abnormal   Collection Time: 12/13/20  9:04 AM  Result Value Ref Range Status   Enterococcus faecalis NOT DETECTED NOT DETECTED Final   Enterococcus Faecium NOT DETECTED NOT DETECTED Final   Listeria monocytogenes NOT DETECTED NOT DETECTED Final   Staphylococcus species DETECTED (A) NOT DETECTED Final    Comment: CRITICAL RESULT CALLED TO, READ BACK BY AND VERIFIED WITH: PHARMD GREG ABBOTT 12/14/20@05 :55 BY TW    Staphylococcus aureus (BCID) NOT DETECTED NOT DETECTED Final   Staphylococcus epidermidis DETECTED (A) NOT DETECTED Final     Comment: Methicillin (oxacillin) resistant coagulase negative staphylococcus. Possible blood culture contaminant (unless isolated from more than one blood culture draw or clinical case suggests pathogenicity). No antibiotic treatment is indicated for blood  culture contaminants. CRITICAL RESULT CALLED TO, READ BACK BY AND VERIFIED WITH: PHARMD GREG ABBOTT 12/14/20@5 :55 BY TW    Staphylococcus lugdunensis NOT DETECTED NOT DETECTED Final   Streptococcus species NOT DETECTED NOT DETECTED Final   Streptococcus agalactiae NOT DETECTED NOT DETECTED Final   Streptococcus pneumoniae NOT DETECTED NOT DETECTED Final   Streptococcus pyogenes NOT DETECTED NOT DETECTED Final   A.calcoaceticus-baumannii NOT DETECTED NOT DETECTED Final   Bacteroides fragilis  NOT DETECTED NOT DETECTED Final   Enterobacterales NOT DETECTED NOT DETECTED Final   Enterobacter cloacae complex NOT DETECTED NOT DETECTED Final   Escherichia coli NOT DETECTED NOT DETECTED Final   Klebsiella aerogenes NOT DETECTED NOT DETECTED Final   Klebsiella oxytoca NOT DETECTED NOT DETECTED Final   Klebsiella pneumoniae NOT DETECTED NOT DETECTED Final   Proteus species NOT DETECTED NOT DETECTED Final   Salmonella species NOT DETECTED NOT DETECTED Final   Serratia marcescens NOT DETECTED NOT DETECTED Final   Haemophilus influenzae NOT DETECTED NOT DETECTED Final   Neisseria meningitidis NOT DETECTED NOT DETECTED Final   Pseudomonas aeruginosa NOT DETECTED NOT DETECTED Final   Stenotrophomonas maltophilia NOT DETECTED NOT DETECTED Final   Candida albicans NOT DETECTED NOT DETECTED Final   Candida auris NOT DETECTED NOT DETECTED Final   Candida glabrata NOT DETECTED NOT DETECTED Final   Candida krusei NOT DETECTED NOT DETECTED Final   Candida parapsilosis NOT DETECTED NOT DETECTED Final   Candida tropicalis NOT DETECTED NOT DETECTED Final   Cryptococcus neoformans/gattii NOT DETECTED NOT DETECTED Final   Methicillin resistance mecA/C DETECTED  (A) NOT DETECTED Final    Comment: CRITICAL RESULT CALLED TO, READ BACK BY AND VERIFIED WITH: PHARMD GREG ABBOTT 12/14/20_0 :55 BY TW Performed at [facility], [address], [place], [place] [address]   Urine Culture     Status: Abnormal   Collection Time: 12/13/20  5:27 PM   Specimen: Urine, Catheterized  Result Value Ref Range Status   Specimen Description URINE, CATHETERIZED  Final   Special Requests   Final    NONE Performed at [facility], [address], [place], [place] [address]    Culture (A)  Final    >=100,000 COLONIES/mL ENTEROCOCCUS FAECALIS 30,000 COLONIES/mL KLEBSIELLA PNEUMONIAE VANCOMYCIN RESISTANT ENTEROCOCCUS    Report Status 12/17/2020 FINAL  Final   Organism ID, Bacteria KLEBSIELLA PNEUMONIAE (A)  Final   Organism ID, Bacteria ENTEROCOCCUS FAECALIS (A)  Final      Susceptibility   Enterococcus faecalis - MIC*    AMPICILLIN <=2 SENSITIVE Sensitive     NITROFURANTOIN <=16 SENSITIVE Sensitive     VANCOMYCIN >=32 RESISTANT Resistant     LINEZOLID 2 SENSITIVE Sensitive     * >=100,000 COLONIES/mL ENTEROCOCCUS FAECALIS   Klebsiella pneumoniae - MIC*    AMPICILLIN >=32 RESISTANT Resistant     CEFAZOLIN <=4 SENSITIVE Sensitive     CEFEPIME <=0.12 SENSITIVE Sensitive     CEFTRIAXONE <=0.25 SENSITIVE Sensitive     CIPROFLOXACIN <=0.25 SENSITIVE Sensitive     GENTAMICIN <=1 SENSITIVE Sensitive     IMIPENEM <=0.25 SENSITIVE Sensitive     NITROFURANTOIN 128 RESISTANT Resistant     TRIMETH/SULFA <=20 SENSITIVE Sensitive     AMPICILLIN/SULBACTAM 4 SENSITIVE Sensitive     PIP/TAZO <=4 SENSITIVE Sensitive     * 30,000 COLONIES/mL KLEBSIELLA PNEUMONIAE  Culture, blood (Routine X 2) w Reflex to ID Panel     Status: Abnormal (Preliminary result)   Collection Time: 12/14/20  7:57 AM   Specimen: BLOOD LEFT HAND  Result Value Ref Range Status   Specimen Description BLOOD LEFT HAND  Final   Special Requests   Final    BOTTLES DRAWN AEROBIC AND ANAEROBIC  Blood Culture adequate volume   Culture  Setup Time   Final    GRAM POSITIVE COCCI IN CLUSTERS IN BOTH AEROBIC AND ANAEROBIC BOTTLES CRITICAL VALUE NOTED.  VALUE IS CONSISTENT WITH PREVIOUSLY REPORTED AND CALLED VALUE. Performed at  [facility], [place] [address] [place], [place] [address]    Culture (A)  Final    STAPHYLOCOCCUS CAPITIS STAPHYLOCOCCUS EPIDERMIDIS    Report Status PENDING  Incomplete   Organism ID, Bacteria STAPHYLOCOCCUS EPIDERMIDIS  Final   Organism ID, Bacteria STAPHYLOCOCCUS CAPITIS  Final      Susceptibility   Staphylococcus capitis - MIC*    CIPROFLOXACIN >=8 RESISTANT Resistant     ERYTHROMYCIN >=8 RESISTANT Resistant     GENTAMICIN 8 INTERMEDIATE Intermediate     OXACILLIN >=4 RESISTANT Resistant     TETRACYCLINE <=1 SENSITIVE Sensitive     VANCOMYCIN <=0.5 SENSITIVE Sensitive     TRIMETH/SULFA <=10 SENSITIVE Sensitive     CLINDAMYCIN >=8 RESISTANT Resistant     RIFAMPIN <=0.5 SENSITIVE Sensitive     Inducible Clindamycin NEGATIVE Sensitive     * STAPHYLOCOCCUS CAPITIS   Staphylococcus epidermidis - MIC*    CIPROFLOXACIN 2 INTERMEDIATE Intermediate     ERYTHROMYCIN >=8 RESISTANT Resistant     GENTAMICIN >=16 RESISTANT Resistant     OXACILLIN >=4 RESISTANT Resistant     TETRACYCLINE 2 SENSITIVE Sensitive     VANCOMYCIN 1 SENSITIVE Sensitive     TRIMETH/SULFA 80 RESISTANT Resistant     CLINDAMYCIN >=8 RESISTANT Resistant     RIFAMPIN <=0.5 SENSITIVE Sensitive     Inducible Clindamycin NEGATIVE Sensitive     * STAPHYLOCOCCUS EPIDERMIDIS  Culture, blood (Routine X 2) w Reflex to ID Panel     Status: None   Collection Time: 12/14/20  8:04 AM   Specimen: BLOOD RIGHT HAND  Result Value Ref Range Status   Specimen Description BLOOD RIGHT HAND  Final   Special Requests   Final    BOTTLES DRAWN AEROBIC AND ANAEROBIC Blood Culture adequate volume   Culture   Final    NO GROWTH 5 DAYS Performed at [facility], [address], [place], [place]  [address]    Report Status 12/19/2020 FINAL  Final  Culture, blood (routine x 2)     Status: None (Preliminary result)   Collection Time: 12/16/20  3:07 PM   Specimen: BLOOD LEFT HAND  Result Value Ref Range Status   Specimen Description BLOOD LEFT HAND  Final   Special Requests   Final    BOTTLES DRAWN AEROBIC ONLY Blood Culture results may not be optimal due to an inadequate volume of blood received in culture bottles   Culture   Final    NO GROWTH 3 DAYS Performed at [facility], [place] [address] [place], [place] [address]    Report Status PENDING  Incomplete  Culture, blood (routine x 2)     Status: None (Preliminary result)   Collection Time: 12/16/20  3:16 PM   Specimen: BLOOD RIGHT HAND  Result Value Ref Range Status   Specimen Description BLOOD RIGHT HAND  Final   Special Requests   Final    BOTTLES DRAWN AEROBIC ONLY Blood Culture results may not be optimal due to an inadequate volume of blood received in culture bottles   Culture   Final    NO GROWTH 3 DAYS Performed at [facility], [place] [address], [place], [place] [address]    Report Status PENDING  Incomplete  MRSA Next Gen by PCR, Nasal     Status: None   Collection Time: 12/18/20  1:16 PM   Specimen: Nasal Mucosa; Nasal Swab  Result Value Ref Range Status   MRSA by PCR Next  Gen NOT DETECTED NOT DETECTED Final    Comment: (NOTE) The GeneXpert MRSA Assay (FDA approved for NASAL specimens only), is one component of a comprehensive MRSA colonization surveillance program. It is not intended to diagnose MRSA infection nor to guide or monitor treatment for MRSA infections. Test performance is not FDA approved in patients less than 74 years old. Performed at [facility], [place] [address], [place], [place] [address]       Studies: No results found.  Scheduled Meds:  arformoterol  15 mcg Nebulization BID   baclofen  5 mg Per Tube TID   budesonide  0.5 mg Nebulization BID   chlorhexidine gluconate  (MEDLINE KIT)  15 mL Mouth Rinse BID   Chlorhexidine Gluconate Cloth  6 each Topical Daily   clonazePAM  1 mg Per Tube BID   clopidogrel  75 mg Per Tube Daily   enoxaparin (LOVENOX) injection  0.5 mg/kg Subcutaneous Q24H   famotidine  20 mg Per Tube BID   feeding supplement (PROSource TF)  45 mL Per Tube BID   fiber  1 packet Per Tube BID   free water  200 mL Per Tube Q4H   Gerhardt's butt cream   Topical BID   guaiFENesin  5 mL Per Tube Q6H   hydrocortisone cream   Topical TID   insulin aspart  0-15 Units Subcutaneous Q4H   insulin glargine  40 Units Subcutaneous QHS   levETIRAcetam  2,500 mg Per Tube BID   loratadine  10 mg Per Tube Daily   mouth rinse  15 mL Mouth Rinse 10 times per day   mouth rinse  15 mL Mouth Rinse 10 times per day   midodrine  2.5 mg Per NG tube Q8H   montelukast  10 mg Per Tube Daily   revefenacin  175 mcg Nebulization Daily   tiZANidine  2 mg Per Tube QHS   valproic acid  1,000 mg Per Tube Q12H   Continuous Infusions:  sodium chloride     feeding supplement (JEVITY 1.5 CAL/FIBER) 1,000 mL (12/19/20 6203)   vancomycin Stopped (12/19/20 2358)    Principal Problem:   Sepsis ([place]) Active Problems:   COPD (chronic obstructive pulmonary disease) (HCC)   Chronic diastolic CHF (congestive heart failure) (HCC)   Acute on chronic respiratory failure with hypoxia (HCC)   GERD (gastroesophageal reflux disease)   Seizures (HCC)   Type 2 diabetes mellitus without complication, with long-term current use of insulin (HCC)   Severe hypoxic-ischemic encephalopathy   Tracheostomy dependence (HCC)   Pressure injury of skin   Difficult airway   Ventilator dependence ([place])   Acquired tracheal collapse   Anoxic brain injury ([place])   MRSA bacteremia   VRE infection (vancomycin resistant Enterococcus)   Klebsiella cystitis   Bacteremia   Consultants: PCCM Palliative medicine Neurology  Procedures: Echocardiogram Replacement of  trach  Antibiotics: Cefepime 6/12 through 6/14 Flagyl 6/12 through 6/14 Vancomycin 6/12 through 6/14 Cefepime x1 dose 6/18 Vancomycin x2 doses 6/18, 6/19   Time spent: 45 minutes    Erin Hearing ANP  [facility] 7 am - 330 pm/M-F for direct patient care and secure chat Please refer to Amion for contact info 80  days

## 2020-12-21 DIAGNOSIS — J9621 Acute and chronic respiratory failure with hypoxia: Secondary | ICD-10-CM | POA: Diagnosis not present

## 2020-12-21 DIAGNOSIS — R7881 Bacteremia: Secondary | ICD-10-CM | POA: Diagnosis not present

## 2020-12-21 DIAGNOSIS — J398 Other specified diseases of upper respiratory tract: Secondary | ICD-10-CM | POA: Diagnosis not present

## 2020-12-21 DIAGNOSIS — G931 Anoxic brain damage, not elsewhere classified: Secondary | ICD-10-CM | POA: Diagnosis not present

## 2020-12-21 LAB — CULTURE, BLOOD (ROUTINE X 2)
Culture: NO GROWTH
Culture: NO GROWTH

## 2020-12-21 LAB — GLUCOSE, CAPILLARY
Glucose-Capillary: 126 mg/dL — ABNORMAL HIGH (ref 70–99)
Glucose-Capillary: 109 mg/dL — ABNORMAL HIGH (ref 70–99)
Glucose-Capillary: 121 mg/dL — ABNORMAL HIGH (ref 70–99)
Glucose-Capillary: 110 mg/dL — ABNORMAL HIGH (ref 70–99)
Glucose-Capillary: 120 mg/dL — ABNORMAL HIGH (ref 70–99)
Glucose-Capillary: 107 mg/dL — ABNORMAL HIGH (ref 70–99)

## 2020-12-21 MED ORDER — CHLORHEXIDINE GLUCONATE CLOTH 2 % EX PADS
6.0000 | MEDICATED_PAD | Freq: Every day | CUTANEOUS | Status: DC
  Administered 2020-12-21 – 2021-01-25 (×40): 6 via TOPICAL

## 2020-12-21 NOTE — Progress Notes (Signed)
[facility] PROGRESS NOTE  Sonia Drake ZOX:[id number] DOB: Feb 16, 1956 DOA: 10/08/2020 PCP: Garwin Brothers, MD  Status: Remains inpatient appropriate because:Altered mental status, Unsafe d/c plan, and Inpatient level of care appropriate due to severity of illness  Dispo: The patient is from: SNF              Anticipated d/c is to: SNF [facility] and [facility] but due to the fact she has an adjustable trach and given her recurrent airway issues as described above they have declined to take her this particular facility.                Patient currently is not medically stable to d/c.   Difficult to place patient Yes   Level of care: ICU  Code Status: Full-pulmonary medicine documents paperwork that presents some conflicting information about the patient's wishes Family Communication: Spoke with patient's daughter in detail on 8/17.  8/26 updated daughter on new diagnosis of MRSE bacteremia. DVT prophylaxis: Lovenox COVID vaccination status: Per H&P patient is not vaccinated   The following pertains to patient's living will dated June 22nd, 2016: 1) no sedative medications to keep her asleep.  She wants to be fully awake regardless of pain 2) she requested all means to keep her alive.  She wants to remain on life support until her daughter, Sonia Drake and the grandchildren decide otherwise after consulting with the medical team 3) she also hand wrote that she wishes to be allowed 82 to 90 days to see if she can pull through  HPI: 65 y/o female with medical history significant of asthma/COPD, CHF, cardiac arrest with PEA/CPR with chronic respiratory failure on permanent trach with supplemental oxygen at 6L, type 2 DM, GERD, seizures and severe hypoxic-ischemic encephalopathy who presented to ER via EMS for respiratory distress from SNF.  She presented with findings consistent with acute aspiration pneumonitis.  She decompensated after arrival and  required transfer to ICU and initiation of mechanical ventilation.  She had sepsis physiology presumed secondary to [facility] bacteremia but it was later determined that the blood culture results were related to contaminants and patient was no longer determined to be septic   She had previously been ventilator dependent but had been weaned from the ventilator briefly during her stay. On 6/16 a bronchoscopy revealed dynamic collapse of her trachea, tracheostomy was changed from 8.0 XLT cuffless to 6.0 XLT cuffed. She now is requiring chronic ventilatory support 2/2 advanced COPD/asthma and tracheobronchomalacia. PCCM physicians updated patient's daughter on status and poor prognosis along with recommendation for chronic vent.  Palliative medicine has had multiple discussions with the daughter.  Daughter continues to request aggressive care.  Unfortunately she continues to have multiple daily events regarding tracheal collapse with mucous plugging requiring excessive lavage and suction as well as bag-valve-mask for ventilation  Patient has had reemergence of continuous seizure activity.  Initially detected by noting patient with rhythmic blinking.  Continuous EEG revealed ongoing seizure activity.  On 8/23 during exam with sternal rub patient had what appeared to be decerebrate posturing, eyes were opened and noted with sluggish response to pupils.  Not long after eyes were opened for examination patient began having forceful, rhythmic spasms of the bilateral face that appeared consistent with seizure activity.  I returned to her room 1 hour later and her eyes were open and she was blinking constantly.  She had no blink reflex when tested.  She had another facial twitch that appeared consistent  with seizure activity.  RN at bedside and witnessed these findings.  Significant Hospital events: 6/12 admitted from skilled nursing facility after aspiration event and copious secretions per tracheostomy 6/13 appears  comfortable currently on 35% ATC. No distress. WBC ct trending down. No events overnight growing GPC clusters in two of two cultures  6/16 increased work of breathing, upper airway noise, abdominal muscle use.  Bronchoscopy performed as trach exchange cuffless #8 Shiley XLT showed dynamic airway collapse, tracheal collapse. 6/16 continued respiratory distress moved to ICU, changed to cuffed #6 Shiley XLT, placed on MV with some improved comfort 6/12 cefepime > 6/14, 6/18 6/12 vanc > 6/14, 6/18 6/23 mucus plugging, severe vent dyssynchrony 6/24-25 intermittent jerking, seizures on EEG 6/27 weaned off propofol gtt 6/28 briefly tolerated trach collar in AM 6/29 tolerated trach collar in AM; no further seizure like activity; working with CM/SW for vent-SNF 7/1 Weaning on CPAP/ PS 10/5, CM SW working on [employer], NO seizure activity, secretions are not an issue per nursing 7/11 trach changed to #8 Bivona with air cuff, much improved. 7/12 TCT 12 hours; rested on Vent overnight 7/13 on TCT 7/23 Trach collar tolerated 7/24 Full vent support 7/26>> Remains on full vent support this am, no issues with secretions per nursing 8/5 occluding trach with dynamic collapse: seen on bronchoscopy 8/6 called back emergently to bedside for tracheostomy occlusion: emergent bronch, bivona trach migrated back 2cm, improved with advancing to 2cm above carina 8/8 collapse caused hypoxia. Improved with repositioning  8/9 afternoon had another episode of tracheal collapse and occlusion of trach requiring BVM, sedation. Family produced paperwork which seems to have conflicting information about pts wishes  8/11: pt continues to have multiple daily episodes of tracheal collapse/mucous plugging req lavage, BVM. Repeated discussions with family re: code status esp in light of paperwork  8/15 consultation placed with ethics committee to evaluate regarding futility of care 8/17 spoke at length with patient's daughter regarding  futility of care CODE STATUS.  Daughter continues to desire aggressive measures. 8/26 through 8/27 blood cultures positive for MDR staph epidermis/capitis and is currently on IV vancomycin 8/28 urine culture positive for VRE and Klebsiella with of which are considered colonization and not active infection.  Subjective: Remains unresponsive except to painful stimuli   Objective: Vitals:   12/21/20 0747 12/21/20 0758  BP:  (!) 65/53  Pulse:  70  Resp:  17  Temp: 98.4 F (36.9 C)   SpO2:  100%    Intake/Output Summary (Last 24 hours) at 12/21/2020 0812 Last data filed at 12/21/2020 0736 Gross per 24 hour  Intake 2995.11 ml  Output 2042 ml  Net 953.11 ml   Filed Weights   12/19/20 0352 12/20/20 0500 12/21/20 0143  Weight: 91.8 kg 92.8 kg 91.8 kg    Exam:  Constitutional: Unresponsive except to extreme painful stimulus Respiratory: 8.0 cuffed Bivona air trach, ventilator settings: PRVC mode,VT 400, Rate 16, PEEP 5, PS 15, FiO2 30%; Lungs CTA .  Does have some respiratory effort over the set ventilatory rate. Cardiovascular: SR/ST,, stable peripheral edema, normotensive on midodrine Abdomen: Nondistended with normoactive bowel sounds. PEG tube for feedings. LBM 8/31 Skin: Stage II decubitus stable Neurologic: Remains unresponsive.  Decerebrate posturing no rub.  Withdraws RUE only to deep painful stimulus.  Pupils:4 mm with very sluggish reaction Psychiatric: Eyes closed.  Unresponsive    Assessment/Plan: Acute problems: Acute on chronic respiratory failure w/ hypoxemia Tracheostomy/vent dependence 2/2 Tracheobronchomalcia and advanced COPD/asthma Not a candidate to wean from  ventilator or decannulation secondary to altered mental status as well as continued issues with tracheal collapse requiring adjustable trach PCCM physician spoke at length with daughter about chronicity of these medical problems and that patient will always be ventilator dependent.  Daughter opted to  continue current aggressive care.   I also spoke at length with daughter on 8/17 and she wishes to continue aggressive care  Patient continues to have issues with intermittent breath stacking as well as tracheal collapse.  Most times when given Ativan develops reflexive hypotension. (See below) Has not tolerated PS mode given above issues therefore no further attempts will be made to utilize this modality.  Known anoxic brain injury after cardiac arrest  Patient without any meaningful improvement.  Currently she continues to have underlying seizure activity as evidenced by rhythmic eye blinking and focal twitching in face and eyes. 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  Discussed with palliative care team and given multiple interactions with patient's family at this juncture they feel they have nothing to offer and agree with ethics committee recommendations for pursuing futility of care.  Patient's family would like to continue care at least to the 90-day mark which will be 9 days from today.  Seizure disorder Present prior to her cardiac arrest -worsened since her anoxic brain injury Continue Keppra, clonazepam and Depakote Episodic issues related to status epilepticus requiring formal neurological consultation LTM obtained on 8/19-20 showed continued seizure spikes -Per neurologist: "Of note, patient has been in persistent vegetative state and I do not believe that treating the seizures will lead to any improvement in patient's outcome.  I would favor not aggressively treating them with antiepileptic medications as this would likely lead to sedation"  History of cardiac arrest Review of outpatient cardiology documents from 09/08/2019  revealed no evidence of CAD Patient was treated for cardiac arrest 2/2 acute hypoxia in August 2021 at [facility]  According to EMS records during that admission patient called 911 in context of respiratory failure.  She was gasping for air and then became unresponsive.  Upon EMS arrival patient was pulseless and apneic.  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 this a.m. SBP 130 so have changed order to hold dose if SBP >/= 130  History of CVA Continue Plavix   Hyponatremia Resolved   Diabetes mellitus 2 on long-term insulin with hyperglycemia Continue Lantus and SSI Hemoglobin A1c 7.0 Current CBGs well-controlled   Hypertension Blood pressure times has been suboptimal therefore beta-blocker discontinued.   Chronic diastolic dysfunction Echocardiogram this admission with grade 1 diastolic dysfunction, moderate LV hypertrophy hyperdynamic LV systolic function Currently euvolemic Monitor I/O Continue as needed beta-blocker for elevated heart rate -SBP remains suboptimal Not on ACE or ARB secondary to suboptimal blood pressure reading   Chronic protein malnutrition secondary to sequelae from anoxic brain injury/PEG tube dependence Continue current tube feedings with [employer] Nutrition Problem: Increased nutrient  needs Etiology: wound healing Signs/Symptoms: estimated needs Interventions: Tube feeding Body mass index is 37.02 kg/m.   Anemia of chronic disease Hemoglobin remained stable around  10  Stage II sacral decubitus Wound / Incision (Open or Dehisced) 10/31/20 (MASD) Moisture Associated Skin Damage Buttocks Bilateral (Active)  Date First Assessed/Time First Assessed: 10/31/20 2100   Wound Type: (MASD) Moisture Associated Skin Damage  Location: Buttocks  Location Orientation: Bilateral  Present on Admission: No    Assessments 10/31/2020  9:20 PM 12/21/2020  7:17 AM  Dressing Type None;Other (Comment) Foam - Lift dressing to assess site every shift  Dressing Status -- Clean;Dry;Intact  Dressing Change Frequency -- Twice a day  Site / Wound Assessment Pink Dressing in place / Unable to assess  Peri-wound Assessment -- Intact  Closure -- None  Treatment Cleansed --     No Linked orders to display       Data Reviewed: Basic Metabolic Panel: Recent Labs  Lab 12/17/20 0159  NA 138  K 3.8  CL 104  CO2 27  GLUCOSE 110*  BUN 22  CREATININE 0.37*  CALCIUM 10.1   Liver Function Tests: No results for input(s): AST, ALT, ALKPHOS, BILITOT, PROT, ALBUMIN in the last 168 hours.    No results for input(s): LIPASE, AMYLASE in the last 168 hours. No results for input(s): AMMONIA in the last 168 hours. CBC: Recent Labs  Lab 12/17/20 0159  WBC 6.2  NEUTROABS 3.4  HGB 9.5*  HCT 30.9*  MCV 88.3  PLT 206   Cardiac Enzymes: No results for input(s): CKTOTAL, CKMB, CKMBINDEX, TROPONINI in the last 168 hours. BNP (last 3 results) Recent Labs    10/04/20 0051 10/05/20 0049 10/06/20 0548  BNP 145.8* 115.3* 125.4*    ProBNP (last 3 results) No results for input(s): PROBNP in the last 8760 hours.  CBG: Recent Labs  Lab 12/20/20 1535 12/20/20 1942 12/20/20 2326 12/21/20 0337 12/21/20 0745  GLUCAP 84 105* 107* 126* 109*    Recent Results (from the past 240 hour(s))  Culture, blood (routine x 2)     Status: Abnormal (Preliminary result)   Collection Time: 12/13/20  8:46 AM   Specimen: BLOOD LEFT HAND  Result Value Ref Range Status   Specimen Description  BLOOD LEFT HAND  Final   Special Requests   Final    BOTTLES DRAWN AEROBIC AND ANAEROBIC Blood Culture results may not be optimal due to an inadequate volume of blood received in culture bottles   Culture  Setup Time   Final    GRAM POSITIVE COCCI CRITICAL VALUE NOTED.  VALUE IS CONSISTENT WITH PREVIOUSLY REPORTED AND CALLED VALUE. ANAEROBIC BOTTLE ONLY    Culture (A)  Final    STAPHYLOCOCCUS CAPITIS STAPHYLOCOCCUS EPIDERMIDIS SUSCEPTIBILITIES TO FOLLOW Performed at [facility], [place] [address], [place], [place] [address]    Report Status PENDING  Incomplete   Organism ID, Bacteria STAPHYLOCOCCUS CAPITIS  Final      Susceptibility   Staphylococcus capitis - MIC*    CIPROFLOXACIN >=8 RESISTANT Resistant     ERYTHROMYCIN >=8 RESISTANT Resistant     GENTAMICIN 8 INTERMEDIATE Intermediate     OXACILLIN >=4 RESISTANT Resistant     TETRACYCLINE 2 SENSITIVE Sensitive     VANCOMYCIN <=0.5 SENSITIVE Sensitive     TRIMETH/SULFA <=10 SENSITIVE Sensitive     CLINDAMYCIN >=8 RESISTANT Resistant     RIFAMPIN <=0.5 SENSITIVE Sensitive     Inducible Clindamycin NEGATIVE Sensitive     * STAPHYLOCOCCUS  CAPITIS  Culture, blood (routine x 2)     Status: Abnormal   Collection Time: 12/13/20  9:04 AM   Specimen: BLOOD RIGHT HAND  Result Value Ref Range Status   Specimen Description BLOOD RIGHT HAND  Final   Special Requests   Final    BOTTLES DRAWN AEROBIC ONLY Blood Culture results may not be optimal due to an inadequate volume of blood received in culture bottles   Culture  Setup Time   Final    GRAM POSITIVE COCCI AEROBIC BOTTLE ONLY CRITICAL RESULT CALLED TO, READ BACK BY AND VERIFIED WITH: PHARMD GREG ABBOTT 12/14/20@05 :55 BY TW Performed at [facility], [place] [address], , [place] [address]    Culture STAPHYLOCOCCUS CAPITIS (A)  Final   Report Status 12/20/2020 FINAL  Final   Organism ID, Bacteria STAPHYLOCOCCUS CAPITIS  Final      Susceptibility   Staphylococcus  capitis - MIC*    CIPROFLOXACIN >=8 RESISTANT Resistant     ERYTHROMYCIN >=8 RESISTANT Resistant     GENTAMICIN 8 INTERMEDIATE Intermediate     OXACILLIN >=4 RESISTANT Resistant     TETRACYCLINE <=1 SENSITIVE Sensitive     VANCOMYCIN <=0.5 SENSITIVE Sensitive     TRIMETH/SULFA <=10 SENSITIVE Sensitive     CLINDAMYCIN >=8 RESISTANT Resistant     RIFAMPIN <=0.5 SENSITIVE Sensitive     Inducible Clindamycin NEGATIVE Sensitive     * STAPHYLOCOCCUS CAPITIS  Blood Culture ID Panel (Reflexed)     Status: Abnormal   Collection Time: 12/13/20  9:04 AM  Result Value Ref Range Status   Enterococcus faecalis NOT DETECTED NOT DETECTED Final   Enterococcus Faecium NOT DETECTED NOT DETECTED Final   Listeria monocytogenes NOT DETECTED NOT DETECTED Final   Staphylococcus species DETECTED (A) NOT DETECTED Final    Comment: CRITICAL RESULT CALLED TO, READ BACK BY AND VERIFIED WITH: PHARMD GREG ABBOTT 12/14/20@05 :55 BY TW    Staphylococcus aureus (BCID) NOT DETECTED NOT DETECTED Final   Staphylococcus epidermidis DETECTED (A) NOT DETECTED Final    Comment: Methicillin (oxacillin) resistant coagulase negative staphylococcus. Possible blood culture contaminant (unless isolated from more than one blood culture draw or clinical case suggests pathogenicity). No antibiotic treatment is indicated for blood  culture contaminants. CRITICAL RESULT CALLED TO, READ BACK BY AND VERIFIED WITH: PHARMD GREG ABBOTT 12/14/20@5 :55 BY TW    Staphylococcus lugdunensis NOT DETECTED NOT DETECTED Final   Streptococcus species NOT DETECTED NOT DETECTED Final   Streptococcus agalactiae NOT DETECTED NOT DETECTED Final   Streptococcus pneumoniae NOT DETECTED NOT DETECTED Final   Streptococcus pyogenes NOT DETECTED NOT DETECTED Final   A.calcoaceticus-baumannii NOT DETECTED NOT DETECTED Final   Bacteroides fragilis NOT DETECTED NOT DETECTED Final   Enterobacterales NOT DETECTED NOT DETECTED Final   Enterobacter cloacae complex  NOT DETECTED NOT DETECTED Final   Escherichia coli NOT DETECTED NOT DETECTED Final   Klebsiella aerogenes NOT DETECTED NOT DETECTED Final   Klebsiella oxytoca NOT DETECTED NOT DETECTED Final   Klebsiella pneumoniae NOT DETECTED NOT DETECTED Final   Proteus species NOT DETECTED NOT DETECTED Final   Salmonella species NOT DETECTED NOT DETECTED Final   Serratia marcescens NOT DETECTED NOT DETECTED Final   Haemophilus influenzae NOT DETECTED NOT DETECTED Final   Neisseria meningitidis NOT DETECTED NOT DETECTED Final   Pseudomonas aeruginosa NOT DETECTED NOT DETECTED Final   Stenotrophomonas maltophilia NOT DETECTED NOT DETECTED Final   Candida albicans NOT DETECTED NOT DETECTED Final   Candida auris NOT DETECTED NOT DETECTED  Final   Candida glabrata NOT DETECTED NOT DETECTED Final   Candida krusei NOT DETECTED NOT DETECTED Final   Candida parapsilosis NOT DETECTED NOT DETECTED Final   Candida tropicalis NOT DETECTED NOT DETECTED Final   Cryptococcus neoformans/gattii NOT DETECTED NOT DETECTED Final   Methicillin resistance mecA/C DETECTED (A) NOT DETECTED Final    Comment: CRITICAL RESULT CALLED TO, READ BACK BY AND VERIFIED WITH: PHARMD GREG ABBOTT 12/14/20@5 :55 BY TW Performed at [place] [address], [place], [place] [address]   Urine Culture     Status: Abnormal   Collection Time: 12/13/20  5:27 PM   Specimen: Urine, Catheterized  Result Value Ref Range Status   Specimen Description URINE, CATHETERIZED  Final   Special Requests   Final    NONE Performed at [facility], [address] [place], [place] [address]    Culture (A)  Final    >=100,000 COLONIES/mL ENTEROCOCCUS FAECALIS 30,000 COLONIES/mL KLEBSIELLA PNEUMONIAE VANCOMYCIN RESISTANT ENTEROCOCCUS    Report Status 12/17/2020 FINAL  Final   Organism ID, Bacteria KLEBSIELLA PNEUMONIAE (A)  Final   Organism ID, Bacteria ENTEROCOCCUS FAECALIS (A)  Final      Susceptibility   Enterococcus faecalis - MIC*     AMPICILLIN <=2 SENSITIVE Sensitive     NITROFURANTOIN <=16 SENSITIVE Sensitive     VANCOMYCIN >=32 RESISTANT Resistant     LINEZOLID 2 SENSITIVE Sensitive     * >=100,000 COLONIES/mL ENTEROCOCCUS FAECALIS   Klebsiella pneumoniae - MIC*    AMPICILLIN >=32 RESISTANT Resistant     CEFAZOLIN <=4 SENSITIVE Sensitive     CEFEPIME <=0.12 SENSITIVE Sensitive     CEFTRIAXONE <=0.25 SENSITIVE Sensitive     CIPROFLOXACIN <=0.25 SENSITIVE Sensitive     GENTAMICIN <=1 SENSITIVE Sensitive     IMIPENEM <=0.25 SENSITIVE Sensitive     NITROFURANTOIN 128 RESISTANT Resistant     TRIMETH/SULFA <=20 SENSITIVE Sensitive     AMPICILLIN/SULBACTAM 4 SENSITIVE Sensitive     PIP/TAZO <=4 SENSITIVE Sensitive     * 30,000 COLONIES/mL KLEBSIELLA PNEUMONIAE  Culture, blood (Routine X 2) w Reflex to ID Panel     Status: Abnormal (Preliminary result)   Collection Time: 12/14/20  7:57 AM   Specimen: BLOOD LEFT HAND  Result Value Ref Range Status   Specimen Description BLOOD LEFT HAND  Final   Special Requests   Final    BOTTLES DRAWN AEROBIC AND ANAEROBIC Blood Culture adequate volume   Culture  Setup Time   Final    GRAM POSITIVE COCCI IN CLUSTERS IN BOTH AEROBIC AND ANAEROBIC BOTTLES CRITICAL VALUE NOTED.  VALUE IS CONSISTENT WITH PREVIOUSLY REPORTED AND CALLED VALUE. Performed at [facility], [place] [address] [place], [place] [address]    Culture (A)  Final    STAPHYLOCOCCUS CAPITIS STAPHYLOCOCCUS EPIDERMIDIS    Report Status PENDING  Incomplete   Organism ID, Bacteria STAPHYLOCOCCUS EPIDERMIDIS  Final   Organism ID, Bacteria STAPHYLOCOCCUS CAPITIS  Final      Susceptibility   Staphylococcus capitis - MIC*    CIPROFLOXACIN >=8 RESISTANT Resistant     ERYTHROMYCIN >=8 RESISTANT Resistant     GENTAMICIN 8 INTERMEDIATE Intermediate     OXACILLIN >=4 RESISTANT Resistant     TETRACYCLINE <=1 SENSITIVE Sensitive     VANCOMYCIN <=0.5 SENSITIVE Sensitive     TRIMETH/SULFA <=10 SENSITIVE Sensitive      CLINDAMYCIN >=8 RESISTANT Resistant     RIFAMPIN <=0.5 SENSITIVE Sensitive     Inducible  Clindamycin NEGATIVE Sensitive     * STAPHYLOCOCCUS CAPITIS   Staphylococcus epidermidis - MIC*    CIPROFLOXACIN 2 INTERMEDIATE Intermediate     ERYTHROMYCIN >=8 RESISTANT Resistant     GENTAMICIN >=16 RESISTANT Resistant     OXACILLIN >=4 RESISTANT Resistant     TETRACYCLINE 2 SENSITIVE Sensitive     VANCOMYCIN 1 SENSITIVE Sensitive     TRIMETH/SULFA 80 RESISTANT Resistant     CLINDAMYCIN >=8 RESISTANT Resistant     RIFAMPIN <=0.5 SENSITIVE Sensitive     Inducible Clindamycin NEGATIVE Sensitive     * STAPHYLOCOCCUS EPIDERMIDIS  Culture, blood (Routine X 2) w Reflex to ID Panel     Status: None   Collection Time: 12/14/20  8:04 AM   Specimen: BLOOD RIGHT HAND  Result Value Ref Range Status   Specimen Description BLOOD RIGHT HAND  Final   Special Requests   Final    BOTTLES DRAWN AEROBIC AND ANAEROBIC Blood Culture adequate volume   Culture   Final    NO GROWTH 5 DAYS Performed at [facility], [address], [place], [place] [address]    Report Status 12/19/2020 FINAL  Final  Culture, blood (routine x 2)     Status: None   Collection Time: 12/16/20  3:07 PM   Specimen: BLOOD LEFT HAND  Result Value Ref Range Status   Specimen Description BLOOD LEFT HAND  Final   Special Requests   Final    BOTTLES DRAWN AEROBIC ONLY Blood Culture results may not be optimal due to an inadequate volume of blood received in culture bottles   Culture   Final    NO GROWTH 5 DAYS Performed at [facility], [place] [address], [place], [place] [address]    Report Status 12/21/2020 FINAL  Final  Culture, blood (routine x 2)     Status: None   Collection Time: 12/16/20  3:16 PM   Specimen: BLOOD RIGHT HAND  Result Value Ref Range Status   Specimen Description BLOOD RIGHT HAND  Final   Special Requests   Final    BOTTLES DRAWN AEROBIC ONLY Blood Culture results may not be optimal due to an  inadequate volume of blood received in culture bottles   Culture   Final    NO GROWTH 5 DAYS Performed at [facility], [place] [address] [place], [place] [address]    Report Status 12/21/2020 FINAL  Final  MRSA Next Gen by PCR, Nasal     Status: None   Collection Time: 12/18/20  1:16 PM   Specimen: Nasal Mucosa; Nasal Swab  Result Value Ref Range Status   MRSA by PCR Next Gen NOT DETECTED NOT DETECTED Final    Comment: (NOTE) The GeneXpert MRSA Assay (FDA approved for NASAL specimens only), is one component of a comprehensive MRSA colonization surveillance program. It is not intended to diagnose MRSA infection nor to guide or monitor treatment for MRSA infections. Test performance is not FDA approved in patients less than 61 years old. Performed at [facility], [place] [address], [place], [place] [address]       Studies: [name] EKG SITE RITE  Result Date: 12/20/2020 If Mark Fromer [facility] image not attached, placement could not be confirmed due to current cardiac rhythm.   Scheduled Meds:  arformoterol  15 mcg Nebulization BID   baclofen  5 mg Per Tube TID   budesonide  0.5 mg Nebulization BID   chlorhexidine gluconate (MEDLINE KIT)  15  mL Mouth Rinse BID   Chlorhexidine Gluconate Cloth  6 each Topical Q2000   clonazePAM  1 mg Per Tube BID   clopidogrel  75 mg Per Tube Daily   enoxaparin (LOVENOX) injection  0.5 mg/kg Subcutaneous Q24H   famotidine  20 mg Per Tube BID   feeding supplement (PROSource TF)  45 mL Per Tube BID   fiber  1 packet Per Tube BID   free water  200 mL Per Tube Q4H   Gerhardt's butt cream   Topical BID   guaiFENesin  5 mL Per Tube Q6H   hydrocortisone cream   Topical TID   insulin aspart  0-15 Units Subcutaneous Q4H   insulin glargine  40 Units Subcutaneous QHS   levETIRAcetam  2,500 mg Per Tube BID   loratadine  10 mg Per Tube Daily   mouth rinse  15 mL Mouth Rinse 10 times per day   midodrine  2.5 mg Per NG tube Q8H   montelukast  10 mg Per Tube  Daily   revefenacin  175 mcg Nebulization Daily   tiZANidine  2 mg Per Tube QHS   valproic acid  1,000 mg Per Tube Q12H   Continuous Infusions:  sodium chloride 10 mL/hr at 12/21/20 0700   feeding supplement (JEVITY 1.5 CAL/FIBER) 1,000 mL (12/20/20 1033)   vancomycin Stopped (12/21/20 0010)    Principal Problem:   Sepsis ([place]) Active Problems:   COPD (chronic obstructive pulmonary disease) (HCC)   Chronic diastolic CHF (congestive heart failure) (HCC)   Acute on chronic respiratory failure with hypoxia (HCC)   GERD (gastroesophageal reflux disease)   Seizures (HCC)   Type 2 diabetes mellitus without complication, with long-term current use of insulin (HCC)   Severe hypoxic-ischemic encephalopathy   Tracheostomy dependence ([place])   Pressure injury of skin   Difficult airway   Ventilator dependence ([place])   Acquired tracheal collapse   Anoxic brain injury ([place])   MRSA bacteremia   VRE infection (vancomycin resistant Enterococcus)   Klebsiella cystitis   Bacteremia   Consultants: PCCM Palliative medicine Neurology  Procedures: Echocardiogram Replacement of trach  Antibiotics: Cefepime 6/12 through 6/14 Flagyl 6/12 through 6/14 Vancomycin 6/12 through 6/14 Cefepime x1 dose 6/18 Vancomycin x2 doses 6/18, 6/19   Time spent: 45 minutes    Erin Hearing ANP  [facility] 7 am - 330 pm/M-F for direct patient care and secure chat Please refer to Amion for contact info 81  days

## 2020-12-22 DIAGNOSIS — G931 Anoxic brain damage, not elsewhere classified: Secondary | ICD-10-CM | POA: Diagnosis not present

## 2020-12-22 DIAGNOSIS — R7881 Bacteremia: Secondary | ICD-10-CM | POA: Diagnosis not present

## 2020-12-22 DIAGNOSIS — J9621 Acute and chronic respiratory failure with hypoxia: Secondary | ICD-10-CM | POA: Diagnosis not present

## 2020-12-22 DIAGNOSIS — J398 Other specified diseases of upper respiratory tract: Secondary | ICD-10-CM | POA: Diagnosis not present

## 2020-12-22 LAB — BASIC METABOLIC PANEL
Anion gap: 7 (ref 5–15)
BUN: 14 mg/dL (ref 8–23)
CO2: 29 mmol/L (ref 22–32)
Calcium: 10.3 mg/dL (ref 8.9–10.3)
Chloride: 103 mmol/L (ref 98–111)
Creatinine, Ser: 0.41 mg/dL — ABNORMAL LOW (ref 0.44–1.00)
GFR, Estimated: >60 mL/min (ref >60)
Glucose, Bld: 102 mg/dL — ABNORMAL HIGH (ref 70–99)
Potassium: 3.9 mmol/L (ref 3.5–5.1)
Sodium: 139 mmol/L (ref 135–145)

## 2020-12-22 LAB — GLUCOSE, CAPILLARY
Glucose-Capillary: 90 mg/dL (ref 70–99)
Glucose-Capillary: 94 mg/dL (ref 70–99)
Glucose-Capillary: 105 mg/dL — ABNORMAL HIGH (ref 70–99)
Glucose-Capillary: 106 mg/dL — ABNORMAL HIGH (ref 70–99)
Glucose-Capillary: 128 mg/dL — ABNORMAL HIGH (ref 70–99)
Glucose-Capillary: 117 mg/dL — ABNORMAL HIGH (ref 70–99)

## 2020-12-22 LAB — CULTURE, BLOOD (ROUTINE X 2)

## 2020-12-22 MED ORDER — INSULIN GLARGINE-YFGN 100 UNIT/ML ~~LOC~~ SOLN
20.0000 [IU] | Freq: Every day | SUBCUTANEOUS | Status: DC
  Administered 2020-12-22 – 2020-12-28 (×7): 20 [IU] via SUBCUTANEOUS
  Filled 2020-12-22 (×9): qty 0.2

## 2020-12-22 MED ORDER — MIDODRINE HCL 5 MG PO TABS
2.5000 mg | ORAL_TABLET | Freq: Four times a day (QID) | ORAL | Status: DC | PRN
  Administered 2020-12-22 – 2021-01-20 (×10): 2.5 mg via NASOGASTRIC
  Filled 2020-12-22 (×13): qty 1

## 2020-12-22 NOTE — Care Management (Signed)
Patient seen and examined at bedside nonresponsive. Currently on the ventilator with tracheostomy

## 2020-12-22 NOTE — Progress Notes (Signed)
[facility] PROGRESS NOTE  BENETTA MACLAREN ZOX:[id number] DOB: Feb 16, 1956 DOA: 10/08/2020 PCP: Garwin Brothers, MD  Status: Remains inpatient appropriate because:Altered mental status, Unsafe d/c plan, and Inpatient level of care appropriate due to severity of illness  Dispo: The patient is from: SNF              Anticipated d/c is to: SNF [facility] and [facility] but due to the fact she has an adjustable trach and given her recurrent airway issues as described above they have declined to take her this particular facility.                Patient currently is not medically stable to d/c.   Difficult to place patient Yes   Level of care: ICU  Code Status: Full-pulmonary medicine documents paperwork that presents some conflicting information about the patient's wishes Family Communication: Spoke with patient's daughter in detail on 8/17.  8/26 updated daughter on new diagnosis of MRSE bacteremia. DVT prophylaxis: Lovenox COVID vaccination status: Per H&P patient is not vaccinated   The following pertains to patient's living will dated June 22nd, 2016: 1) no sedative medications to keep her asleep.  She wants to be fully awake regardless of pain 2) she requested all means to keep her alive.  She wants to remain on life support until her daughter, [name] and the grandchildren decide otherwise after consulting with the medical team 3) she also hand wrote that she wishes to be allowed 82 to 90 days to see if she can pull through  HPI: 65 y/o female with medical history significant of asthma/COPD, CHF, cardiac arrest with PEA/CPR with chronic respiratory failure on permanent trach with supplemental oxygen at 6L, type 2 DM, GERD, seizures and severe hypoxic-ischemic encephalopathy who presented to ER via EMS for respiratory distress from SNF.  She presented with findings consistent with acute aspiration pneumonitis.  She decompensated after arrival and  required transfer to ICU and initiation of mechanical ventilation.  She had sepsis physiology presumed secondary to [facility] bacteremia but it was later determined that the blood culture results were related to contaminants and patient was no longer determined to be septic   She had previously been ventilator dependent but had been weaned from the ventilator briefly during her stay. On 6/16 a bronchoscopy revealed dynamic collapse of her trachea, tracheostomy was changed from 8.0 XLT cuffless to 6.0 XLT cuffed. She now is requiring chronic ventilatory support 2/2 advanced COPD/asthma and tracheobronchomalacia. PCCM physicians updated patient's daughter on status and poor prognosis along with recommendation for chronic vent.  Palliative medicine has had multiple discussions with the daughter.  Daughter continues to request aggressive care.  Unfortunately she continues to have multiple daily events regarding tracheal collapse with mucous plugging requiring excessive lavage and suction as well as bag-valve-mask for ventilation  Patient has had reemergence of continuous seizure activity.  Initially detected by noting patient with rhythmic blinking.  Continuous EEG revealed ongoing seizure activity.  On 8/23 during exam with sternal rub patient had what appeared to be decerebrate posturing, eyes were opened and noted with sluggish response to pupils.  Not long after eyes were opened for examination patient began having forceful, rhythmic spasms of the bilateral face that appeared consistent with seizure activity.  I returned to her room 1 hour later and her eyes were open and she was blinking constantly.  She had no blink reflex when tested.  She had another facial twitch that appeared consistent  with seizure activity.  RN at bedside and witnessed these findings.  Significant Hospital events: 6/12 admitted from skilled nursing facility after aspiration event and copious secretions per tracheostomy 6/13 appears  comfortable currently on 35% ATC. No distress. WBC ct trending down. No events overnight growing GPC clusters in two of two cultures  6/16 increased work of breathing, upper airway noise, abdominal muscle use.  Bronchoscopy performed as trach exchange cuffless #8 Shiley XLT showed dynamic airway collapse, tracheal collapse. 6/16 continued respiratory distress moved to ICU, changed to cuffed #6 Shiley XLT, placed on MV with some improved comfort 6/12 cefepime > 6/14, 6/18 6/12 vanc > 6/14, 6/18 6/23 mucus plugging, severe vent dyssynchrony 6/24-25 intermittent jerking, seizures on EEG 6/27 weaned off propofol gtt 6/28 briefly tolerated trach collar in AM 6/29 tolerated trach collar in AM; no further seizure like activity; working with CM/SW for vent-SNF 7/1 Weaning on CPAP/ PS 10/5, CM SW working on [employer], NO seizure activity, secretions are not an issue per nursing 7/11 trach changed to #8 Bivona with air cuff, much improved. 7/12 TCT 12 hours; rested on Vent overnight 7/13 on TCT 7/23 Trach collar tolerated 7/24 Full vent support 7/26>> Remains on full vent support this am, no issues with secretions per nursing 8/5 occluding trach with dynamic collapse: seen on bronchoscopy 8/6 called back emergently to bedside for tracheostomy occlusion: emergent bronch, bivona trach migrated back 2cm, improved with advancing to 2cm above carina 8/8 collapse caused hypoxia. Improved with repositioning  8/9 afternoon had another episode of tracheal collapse and occlusion of trach requiring BVM, sedation. Family produced paperwork which seems to have conflicting information about pts wishes  8/11: pt continues to have multiple daily episodes of tracheal collapse/mucous plugging req lavage, BVM. Repeated discussions with family re: code status esp in light of paperwork  8/15 consultation placed with ethics committee to evaluate regarding futility of care 8/17 spoke at length with patient's daughter regarding  futility of care CODE STATUS.  Daughter continues to desire aggressive measures. 8/26 through 8/27 blood cultures positive for MDR staph epidermis/capitis and is currently on IV vancomycin 8/28 urine culture positive for VRE and Klebsiella with of which are considered colonization and not active infection. 9/1 completed recommended course of IV vancomycin 9/2 increased frequency of IV Ativan in the past 24 hours-noted increased heart rate in BP-clear if Ativan usage secondary to vent dyssynchrony or other issues.  Subjective: Unresponsive   Objective: Vitals:   12/22/20 0600 12/22/20 0700  BP: (!) 191/119 (!) 159/107  Pulse: (!) 117 (!) 111  Resp: (!) 23 (!) 25  Temp:    SpO2: 99% 98%    Intake/Output Summary (Last 24 hours) at 12/22/2020 0735 Last data filed at 12/22/2020 0700 Gross per 24 hour  Intake 3124.77 ml  Output 1250 ml  Net 1874.77 ml   Filed Weights   12/20/20 0500 12/21/20 0143 12/22/20 0400  Weight: 92.8 kg 91.8 kg 93.8 kg    Exam:  Constitutional: Unresponsive except to extreme painful stimulus Respiratory: 8.0 cuffed Bivona air trach, ventilator settings: PRVC mode,VT 400, Rate 16, PEEP 5, PS 15, FiO2 30%; Lungs CTA .  Does have some respiratory effort over the set ventilatory rate.  No increased work of breathing or obvious ventilator dyssynchrony while in room Cardiovascular: SR/ST, intermittent hypertension with tachycardia overnight, stable peripheral edema, on midodrine Abdomen: Nondistended with normoactive bowel sounds. PEG tube for feedings. LBM 8/31 Skin: Stage II decubitus stable Neurologic: Remains unresponsive.  Decerebrate posturing to  sternal rub rub.  Withdraws right side to deep painful stimulus.  Pupils: Right 4 mm and left 2 mm with very sluggish reaction Psychiatric: Eyes closed.  Unresponsive    Assessment/Plan: Acute problems: Acute on chronic respiratory failure w/ hypoxemia Tracheostomy/vent dependence 2/2 Tracheobronchomalcia and  advanced COPD/asthma Not a candidate to wean from ventilator/decannulate 2/2 unresponsive status as well as continued issues with tracheal collapse requiring adjustable trach PCCM physician spoke at length with daughter about chronicity of these medical problems and that patient will always be ventilator dependent.  Daughter opted to continue current aggressive care.   I also spoke at length with daughter on 8/17 and she wishes to continue aggressive care until reaches 90-day mark requested by patient on her advanced directives-90 day clock started at time of admission  Patient continues to have issues with intermittent breath stacking/ventilator dyssynchrony as well as tracheal collapse.   Has not tolerated PS mode given above issues therefore no further attempts will be made to utilize this modality.  Known anoxic brain injury after cardiac arrest  Remains unresponsive except to deep painful stimulation with decerebrate posturing. She continues to have underlying seizure activity as evidenced by rhythmic eye blinking and focal twitching of face and eyes. 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  Discussed with palliative care team and given multiple interactions with patient's family at this juncture they feel they have nothing to offer and agree with ethics committee recommendations for pursuing futility of care.  Patient's family would like to continue aggressive care at least to the 90-day mark which will be 8 days from today. Had been having issues with hypotension and was placed on midodrine.  Episodes of hypotension and tachycardia.  Midodrine changed to prn.  Hopeful that vital sign changes were reflective of medication otherwise ICP could be increasing which  would lead to further decreased CPP and further brain injury  Seizure disorder Chronic problem prior to anoxic brain injury-worsened since her anoxic brain injury Continue Keppra, clonazepam and Depakote Episodic issues related to status epilepticus- formal neurological consultation this admission LTM EEG obtained on 8/19-20 demonstrating continued seizure spikes -Per neurologist: "Of note, patient has been in persistent vegetative state and I do not believe that treating the seizures will lead to any improvement in patient's outcome.  I would favor not aggressively treating them with antiepileptic medications as this would likely lead to sedation"  History of cardiac arrest Review of outpatient cardiology documents from 09/08/2019: Cardiac catheterization revealed no evidence of CAD Patient was treated for cardiac arrest 2/2 acute hypoxia in August 2021 at [facility]  According to EMS records during that admission patient called 911 in context of respiratory failure.  She was gasping for air and then became unresponsive.  Upon EMS arrival patient was pulseless and apneic.  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   Chronic diastolic dysfunction Echocardiogram this admission with grade 1 diastolic dysfunction, moderate LV hypertrophy hyperdynamic LV systolic function Currently euvolemic Monitor I/O Continue as needed beta-blocker for elevated heart rate -SBP remains suboptimal Not on ACE or ARB secondary to suboptimal blood pressure reading   Chronic protein malnutrition secondary to sequelae from anoxic brain injury/PEG tube dependence Continue current tube feedings with [employer] Nutrition Problem: Increased nutrient needs Etiology: wound healing Signs/Symptoms: estimated needs Interventions: Tube feeding Body mass index is 37.82 kg/m.   Anemia of chronic disease Hemoglobin remained stable around 10  Stage II sacral decubitus Wound / Incision (Open or Dehisced) 10/31/20 (MASD) Moisture Associated Skin Damage Buttocks Bilateral (Active)  Date First Assessed/Time First Assessed: 10/31/20 2100   Wound Type: (MASD) Moisture Associated Skin Damage  Location: Buttocks  Location Orientation: Bilateral  Present on Admission: No    Assessments 10/31/2020  9:20 PM 12/21/2020  8:00 PM  Dressing Type None;Other (Comment) Foam - Lift dressing to assess site every shift  Dressing Status -- Clean;Intact;Dry  Dressing Change Frequency -- Twice a day  Site / Wound Assessment Pink Pink  Treatment Cleansed --     No Linked orders to display       Data Reviewed: Basic Metabolic Panel: Recent Labs  Lab 12/17/20 0159 12/22/20 0227  NA 138 139  K 3.8 3.9  CL 104 103  CO2 27 29  GLUCOSE 110* 102*  BUN 22 14  CREATININE 0.37* 0.41*  CALCIUM 10.1 10.3   Liver Function Tests: No results for input(s): AST, ALT, ALKPHOS, BILITOT, PROT, ALBUMIN in the last 168 hours.    No results for input(s): LIPASE, AMYLASE in the last 168 hours. No results for input(s): AMMONIA in the last 168 hours. CBC: Recent Labs  Lab 12/17/20 0159  WBC 6.2  NEUTROABS 3.4  HGB 9.5*  HCT 30.9*  MCV 88.3   PLT 206   Cardiac Enzymes: No results for input(s): CKTOTAL, CKMB, CKMBINDEX, TROPONINI in the last 168 hours. BNP (last 3 results) Recent Labs    10/04/20 0051 10/05/20 0049 10/06/20 0548  BNP 145.8* 115.3* 125.4*    ProBNP (last 3 results) No results for input(s): PROBNP in the last 8760 hours.  CBG: Recent Labs  Lab 12/21/20 1134 12/21/20 1525 12/21/20 1938 12/21/20 2328 12/22/20 0324  GLUCAP 121* 110* 120* 107* 90    Recent Results (from the past 240 hour(s))  Culture, blood (routine x 2)     Status: Abnormal   Collection Time: 12/13/20  8:46 AM   Specimen: BLOOD LEFT HAND  Result Value Ref Range Status   Specimen Description BLOOD LEFT HAND  Final   Special Requests   Final    BOTTLES DRAWN AEROBIC AND ANAEROBIC Blood Culture results may not be optimal due to an inadequate volume of blood received in culture bottles   Culture  Setup Time   Final    GRAM POSITIVE COCCI CRITICAL VALUE NOTED.  VALUE IS CONSISTENT WITH PREVIOUSLY REPORTED AND CALLED VALUE. ANAEROBIC BOTTLE ONLY Performed at [facility], [place] [address] [place], [place] [address]    Culture (A)  Final    STAPHYLOCOCCUS CAPITIS STAPHYLOCOCCUS EPIDERMIDIS    Report Status 12/22/2020 FINAL  Final   Organism ID, Bacteria STAPHYLOCOCCUS CAPITIS  Final   Organism ID, Bacteria STAPHYLOCOCCUS EPIDERMIDIS  Final      Susceptibility   Staphylococcus capitis - MIC*    CIPROFLOXACIN >=8 RESISTANT Resistant     ERYTHROMYCIN >=8 RESISTANT Resistant     GENTAMICIN 8 INTERMEDIATE Intermediate     OXACILLIN >=4 RESISTANT Resistant     TETRACYCLINE 2 SENSITIVE Sensitive     VANCOMYCIN <=0.5 SENSITIVE Sensitive     TRIMETH/SULFA <=10 SENSITIVE Sensitive     CLINDAMYCIN >=8 RESISTANT Resistant     RIFAMPIN <=0.5 SENSITIVE Sensitive     Inducible Clindamycin NEGATIVE Sensitive     * STAPHYLOCOCCUS CAPITIS   Staphylococcus epidermidis - MIC*    CIPROFLOXACIN 4 RESISTANT Resistant     ERYTHROMYCIN >=8  RESISTANT Resistant     GENTAMICIN >=16 RESISTANT Resistant     OXACILLIN >=4 RESISTANT Resistant     TETRACYCLINE 2 SENSITIVE Sensitive     VANCOMYCIN 1 SENSITIVE Sensitive     TRIMETH/SULFA 80 RESISTANT Resistant     CLINDAMYCIN >=8 RESISTANT Resistant     RIFAMPIN <=0.5 SENSITIVE Sensitive     Inducible Clindamycin NEGATIVE Sensitive     * STAPHYLOCOCCUS EPIDERMIDIS  Culture, blood (routine x 2)     Status: Abnormal   Collection Time: 12/13/20  9:04 AM   Specimen: BLOOD RIGHT HAND  Result Value Ref Range Status   Specimen Description BLOOD RIGHT HAND  Final   Special Requests   Final    BOTTLES DRAWN AEROBIC ONLY Blood Culture results may not be optimal due to an inadequate volume of blood received in culture bottles   Culture  Setup Time   Final    GRAM POSITIVE COCCI AEROBIC BOTTLE ONLY CRITICAL RESULT CALLED TO, READ BACK BY AND VERIFIED WITH: PHARMD GREG ABBOTT 12/14/20_0 :55 BY TW Performed at [facility], [address] [place], [place] [address]    Culture STAPHYLOCOCCUS CAPITIS (A)  Final   Report Status 12/20/2020 FINAL  Final   Organism ID, Bacteria STAPHYLOCOCCUS CAPITIS  Final      Susceptibility   Staphylococcus capitis - MIC*    CIPROFLOXACIN >=8 RESISTANT Resistant     ERYTHROMYCIN >=8 RESISTANT Resistant     GENTAMICIN 8 INTERMEDIATE Intermediate     OXACILLIN >=4 RESISTANT Resistant     TETRACYCLINE <=1 SENSITIVE Sensitive     VANCOMYCIN <=0.5 SENSITIVE Sensitive     TRIMETH/SULFA <=10 SENSITIVE Sensitive     CLINDAMYCIN >=8 RESISTANT Resistant     RIFAMPIN <=0.5 SENSITIVE Sensitive     Inducible Clindamycin NEGATIVE Sensitive     * STAPHYLOCOCCUS CAPITIS  Blood Culture ID Panel (Reflexed)     Status: Abnormal   Collection Time: 12/13/20  9:04 AM  Result Value Ref Range Status   Enterococcus faecalis NOT DETECTED NOT DETECTED Final   Enterococcus Faecium NOT DETECTED NOT DETECTED Final   Listeria monocytogenes NOT DETECTED NOT DETECTED Final    Staphylococcus species DETECTED (A) NOT DETECTED Final    Comment: CRITICAL RESULT CALLED TO, READ BACK BY AND VERIFIED WITH: PHARMD GREG ABBOTT 12/14/20_1 :55 BY TW    Staphylococcus aureus (BCID) NOT DETECTED NOT DETECTED Final   Staphylococcus epidermidis DETECTED (A) NOT DETECTED Final    Comment: Methicillin (oxacillin) resistant coagulase negative staphylococcus. Possible blood culture contaminant (unless isolated from more than one blood culture draw or clinical case suggests pathogenicity). No antibiotic treatment is indicated for blood  culture contaminants. CRITICAL RESULT CALLED TO, READ BACK BY AND VERIFIED WITH: PHARMD GREG ABBOTT 12/14/20_2 :67  BY TW    Staphylococcus lugdunensis NOT DETECTED NOT DETECTED Final   Streptococcus species NOT DETECTED NOT DETECTED Final   Streptococcus agalactiae NOT DETECTED NOT DETECTED Final   Streptococcus pneumoniae NOT DETECTED NOT DETECTED Final   Streptococcus pyogenes NOT DETECTED NOT DETECTED Final   A.calcoaceticus-baumannii NOT DETECTED NOT DETECTED Final   Bacteroides fragilis NOT DETECTED NOT DETECTED Final   Enterobacterales NOT DETECTED NOT DETECTED Final   Enterobacter cloacae complex NOT DETECTED NOT DETECTED Final   Escherichia coli NOT DETECTED NOT DETECTED Final   Klebsiella aerogenes NOT DETECTED NOT DETECTED Final   Klebsiella oxytoca NOT DETECTED NOT DETECTED Final   Klebsiella pneumoniae NOT DETECTED NOT DETECTED Final   Proteus species NOT DETECTED NOT DETECTED Final   Salmonella species NOT DETECTED NOT DETECTED Final   Serratia marcescens NOT DETECTED NOT DETECTED Final   Haemophilus influenzae NOT DETECTED NOT DETECTED Final   Neisseria meningitidis NOT DETECTED NOT DETECTED Final   Pseudomonas aeruginosa NOT DETECTED NOT DETECTED Final   Stenotrophomonas maltophilia NOT DETECTED NOT DETECTED Final   Candida albicans NOT DETECTED NOT DETECTED Final   Candida auris NOT DETECTED NOT DETECTED Final   Candida glabrata  NOT DETECTED NOT DETECTED Final   Candida krusei NOT DETECTED NOT DETECTED Final   Candida parapsilosis NOT DETECTED NOT DETECTED Final   Candida tropicalis NOT DETECTED NOT DETECTED Final   Cryptococcus neoformans/gattii NOT DETECTED NOT DETECTED Final   Methicillin resistance mecA/C DETECTED (A) NOT DETECTED Final    Comment: CRITICAL RESULT CALLED TO, READ BACK BY AND VERIFIED WITH: PHARMD GREG ABBOTT 12/14/20_0 :55 BY TW Performed at [facility], [address], [place], [place] [address]   Urine Culture     Status: Abnormal   Collection Time: 12/13/20  5:27 PM   Specimen: Urine, Catheterized  Result Value Ref Range Status   Specimen Description URINE, CATHETERIZED  Final   Special Requests   Final    NONE Performed at [facility], [address] [place], [place] [address]    Culture (A)  Final    >=100,000 COLONIES/mL ENTEROCOCCUS FAECALIS 30,000 COLONIES/mL KLEBSIELLA PNEUMONIAE VANCOMYCIN RESISTANT ENTEROCOCCUS    Report Status 12/17/2020 FINAL  Final   Organism ID, Bacteria KLEBSIELLA PNEUMONIAE (A)  Final   Organism ID, Bacteria ENTEROCOCCUS FAECALIS (A)  Final      Susceptibility   Enterococcus faecalis - MIC*    AMPICILLIN <=2 SENSITIVE Sensitive     NITROFURANTOIN <=16 SENSITIVE Sensitive     VANCOMYCIN >=32 RESISTANT Resistant     LINEZOLID 2 SENSITIVE Sensitive     * >=100,000 COLONIES/mL ENTEROCOCCUS FAECALIS   Klebsiella pneumoniae - MIC*    AMPICILLIN >=32 RESISTANT Resistant     CEFAZOLIN <=4 SENSITIVE Sensitive     CEFEPIME <=0.12 SENSITIVE Sensitive     CEFTRIAXONE <=0.25 SENSITIVE Sensitive     CIPROFLOXACIN <=0.25 SENSITIVE Sensitive     GENTAMICIN <=1 SENSITIVE Sensitive     IMIPENEM <=0.25 SENSITIVE Sensitive     NITROFURANTOIN 128 RESISTANT Resistant     TRIMETH/SULFA <=20 SENSITIVE Sensitive     AMPICILLIN/SULBACTAM 4 SENSITIVE Sensitive     PIP/TAZO <=4 SENSITIVE Sensitive     * 30,000 COLONIES/mL KLEBSIELLA PNEUMONIAE  Culture,  blood (Routine X 2) w Reflex to ID Panel     Status: Abnormal (Preliminary result)   Collection Time: 12/14/20  7:57 AM   Specimen: BLOOD LEFT HAND  Result Value Ref Range Status   Specimen Description BLOOD LEFT  HAND  Final   Special Requests   Final    BOTTLES DRAWN AEROBIC AND ANAEROBIC Blood Culture adequate volume   Culture  Setup Time   Final    GRAM POSITIVE COCCI IN CLUSTERS IN BOTH AEROBIC AND ANAEROBIC BOTTLES CRITICAL VALUE NOTED.  VALUE IS CONSISTENT WITH PREVIOUSLY REPORTED AND CALLED VALUE. Performed at [facility], [place] [address], [place], [place] [address]    Culture (A)  Final    STAPHYLOCOCCUS CAPITIS STAPHYLOCOCCUS EPIDERMIDIS    Report Status PENDING  Incomplete   Organism ID, Bacteria STAPHYLOCOCCUS EPIDERMIDIS  Final   Organism ID, Bacteria STAPHYLOCOCCUS CAPITIS  Final      Susceptibility   Staphylococcus capitis - MIC*    CIPROFLOXACIN >=8 RESISTANT Resistant     ERYTHROMYCIN >=8 RESISTANT Resistant     GENTAMICIN 8 INTERMEDIATE Intermediate     OXACILLIN >=4 RESISTANT Resistant     TETRACYCLINE <=1 SENSITIVE Sensitive     VANCOMYCIN <=0.5 SENSITIVE Sensitive     TRIMETH/SULFA <=10 SENSITIVE Sensitive     CLINDAMYCIN >=8 RESISTANT Resistant     RIFAMPIN <=0.5 SENSITIVE Sensitive     Inducible Clindamycin NEGATIVE Sensitive     * STAPHYLOCOCCUS CAPITIS   Staphylococcus epidermidis - MIC*    CIPROFLOXACIN 2 INTERMEDIATE Intermediate     ERYTHROMYCIN >=8 RESISTANT Resistant     GENTAMICIN >=16 RESISTANT Resistant     OXACILLIN >=4 RESISTANT Resistant     TETRACYCLINE 2 SENSITIVE Sensitive     VANCOMYCIN 1 SENSITIVE Sensitive     TRIMETH/SULFA 80 RESISTANT Resistant     CLINDAMYCIN >=8 RESISTANT Resistant     RIFAMPIN <=0.5 SENSITIVE Sensitive     Inducible Clindamycin NEGATIVE Sensitive     * STAPHYLOCOCCUS EPIDERMIDIS  Culture, blood (Routine X 2) w Reflex to ID Panel     Status: None   Collection Time: 12/14/20  8:04 AM   Specimen: BLOOD  RIGHT HAND  Result Value Ref Range Status   Specimen Description BLOOD RIGHT HAND  Final   Special Requests   Final    BOTTLES DRAWN AEROBIC AND ANAEROBIC Blood Culture adequate volume   Culture   Final    NO GROWTH 5 DAYS Performed at [facility], [address], [place], [place] [address]    Report Status 12/19/2020 FINAL  Final  Culture, blood (routine x 2)     Status: None   Collection Time: 12/16/20  3:07 PM   Specimen: BLOOD LEFT HAND  Result Value Ref Range Status   Specimen Description BLOOD LEFT HAND  Final   Special Requests   Final    BOTTLES DRAWN AEROBIC ONLY Blood Culture results may not be optimal due to an inadequate volume of blood received in culture bottles   Culture   Final    NO GROWTH 5 DAYS Performed at [facility], [place] [address], [place], [place] [address]    Report Status 12/21/2020 FINAL  Final  Culture, blood (routine x 2)     Status: None   Collection Time: 12/16/20  3:16 PM   Specimen: BLOOD RIGHT HAND  Result Value Ref Range Status   Specimen Description BLOOD RIGHT HAND  Final   Special Requests   Final    BOTTLES DRAWN AEROBIC ONLY Blood Culture results may not be optimal due to an inadequate volume of blood received in culture bottles   Culture   Final    NO GROWTH 5 DAYS Performed at [facility], [place] [address] [place], [place] [address]    Report Status 12/21/2020 FINAL  Final  MRSA Next Gen by PCR, Nasal     Status: None   Collection Time: 12/18/20  1:16 PM   Specimen: Nasal Mucosa; Nasal Swab  Result Value Ref Range Status   MRSA by PCR Next Gen NOT DETECTED NOT DETECTED Final    Comment: (NOTE) The GeneXpert MRSA Assay (FDA approved for NASAL specimens only), is one component of a comprehensive MRSA colonization surveillance program. It is not intended to diagnose MRSA infection nor to guide or monitor treatment for MRSA infections. Test performance is not FDA approved in patients less than 56  years old. Performed at [facility], [place] [address] [place], [place] [address]       Studies: [name] EKG SITE RITE  Result Date: 12/20/2020 If [facility] image not attached, placement could not be confirmed due to current cardiac rhythm.   Scheduled Meds:  arformoterol  15 mcg Nebulization BID   baclofen  5 mg Per Tube TID   budesonide  0.5 mg Nebulization BID   chlorhexidine gluconate (MEDLINE KIT)  15 mL Mouth Rinse BID   Chlorhexidine Gluconate Cloth  6 each Topical Q2000   clonazePAM  1 mg Per Tube BID   clopidogrel  75 mg Per Tube Daily   enoxaparin (LOVENOX) injection  0.5 mg/kg Subcutaneous Q24H   famotidine  20 mg Per Tube BID   feeding supplement (PROSource TF)  45 mL Per Tube BID   fiber  1 packet Per Tube BID   free water  200 mL Per Tube Q4H   Gerhardt's butt cream   Topical BID   guaiFENesin  5 mL Per Tube Q6H   hydrocortisone cream   Topical TID   insulin aspart  0-15 Units Subcutaneous Q4H   insulin glargine  40 Units Subcutaneous QHS   levETIRAcetam  2,500 mg Per Tube BID   loratadine  10 mg Per Tube Daily   mouth rinse  15 mL Mouth Rinse 10 times per day   montelukast  10 mg Per Tube Daily   revefenacin  175 mcg Nebulization Daily   tiZANidine  2 mg Per Tube QHS   valproic acid  1,000 mg Per Tube Q12H   Continuous Infusions:  sodium chloride 10 mL/hr at 12/22/20 0400   feeding supplement (JEVITY 1.5 CAL/FIBER) 1,000 mL (12/21/20 1231)    Principal Problem:   Sepsis ([place]) Active Problems:   COPD (chronic obstructive pulmonary disease) (HCC)   Chronic diastolic CHF (congestive heart failure) (HCC)   Acute on chronic respiratory failure with hypoxia (HCC)   GERD (gastroesophageal reflux disease)   Seizures ([place])   Type 2 diabetes mellitus without complication, with long-term current use of insulin (HCC)   Severe hypoxic-ischemic encephalopathy   Tracheostomy dependence (HCC)   Pressure injury of skin   Difficult airway   Ventilator dependence  ([place])   Acquired tracheal collapse   Anoxic brain injury ([place])   MRSA bacteremia   VRE infection (vancomycin resistant Enterococcus)   Klebsiella cystitis   Bacteremia   Consultants: PCCM Palliative medicine Neurology  Procedures: Echocardiogram Replacement of trach  Antibiotics: Cefepime 6/12 through 6/14 Flagyl 6/12 through 6/14 Vancomycin 6/12 through 6/14 Cefepime x1 dose 6/18 Vancomycin x2 doses 6/18, 6/19   Time spent: 45 minutes    Erin Hearing ANP  [facility] 7 am - 330 pm/M-F for direct patient care and secure chat Please refer to  Amion for contact info 82  days

## 2020-12-22 NOTE — Progress Notes (Signed)
NAME:  Sonia Drake, MRN:  [id number], DOB:  1956/03/18, LOS: 82 ADMISSION DATE:  10/08/2020, CONSULTATION DATE:  6/15 REFERRING MD:  Thedore Mins, CHIEF COMPLAINT:  Dyspnea   History of Present Illness:  65 y/o female with anoxic brain injury, tracheostomy status who was admitted from a SNF on 6/12 with worsening respiratory failure.  She had previously been ventilator dependent but had been weaned from the trach.  On 6/16 a bronchoscopy revealed dynamic collapse of her trachea, tracheostomy was replaced.  Later required mechanical ventilation.  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 No distress. WBC ct trending down. No events overnight growing GPC clusters in two of two cultures  6/16 increased work of breathing, upper airway noise, abdominal muscle use.  Bronchoscopy performed as trach exchange cuffless #8 Shiley XLT showed dynamic airway collapse, tracheal collapse. 6/16 continued respiratory distress moved to ICU, changed to cuffed #6 Shiley XLT, placed on MV with some improved comfort 6/12 cefepime > 6/14, 6/18 6/12 vanc > 6/14, 6/18 6/23 mucus plugging, severe vent dyssynchrony 6/24-25 intermittent jerking, seizures on EEG 6/27 weaned off propofol gtt 6/28 briefly tolerated trach collar in AM 6/29 tolerated trach collar in AM; no further seizure like activity; working with CM/SW for vent-SNF 7/1 Weaning on CPAP/ PS 10/5, CM SW working on [employer], NO seizure activity, secretions are not an issue per  nursing 7/11 trach changed to #8 Bivona with air cuff, much improved. 7/12 TCT 12 hours; rested on Vent overnight 7/13 on TCT 7/23 Trach collar tolerated 7/24 Full vent support 7/26>> Remains on full vent support this am, no issues with secretions per nursing 8/5 occluding trach with dynamic collapse: seen on bronchoscopy 8/6 called back emergently to bedside for tracheostomy occlusion: emergent bronch, bivona trach migrated back 2cm, improved with advancing to 2cm above carina 8/8 collapse caused hypoxia. Improved with repositioning  8/9 afternoon had another episode of tracheal collapse and occlusion of trach requiring BVM, sedation. Family produced paperwork which seems to have conflicting information about pts wishes  8/11: pt continues to have multiple daily episodes of tracheal collapse/mucous plugging req lavage, BVM. 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 Neuro consulted 8/30 overnight , BP drop which responded to fluid bolus 9/2 Continues to have labile BP, now midodrine prn  Interim History / Subjective:   No more episodes for resp distress, no plugging per nursing No myoclonic seizures per nursing.  Secretions are manageable per nursing  Objective   Blood pressure (!) 158/105, pulse (!) 107, temperature 98.3 F (36.8 C), temperature source Oral, resp. rate 20, height 5\' 2"  (1.575 m), weight 93.8 kg, SpO2 98 %.    Vent Mode: PRVC FiO2 (%):  [30 %] 30 % Set Rate:  [16 bmp] 16 bmp Vt Set:  [400 mL] 400 mL PEEP:  [5 cmH20] 5 cmH20 Plateau Pressure:  [15 cmH20-19 cmH20] 16 cmH20   Intake/Output Summary (Last 24 hours) at 12/22/2020 1027 Last data filed at 12/22/2020 0700 Gross per 24 hour  Intake 1833.52 ml  Output 1250 ml  Net 583.52 ml   Filed Weights   12/20/20 0500 12/21/20 0143 12/22/20 0400  Weight: 92.8 kg 91.8 kg 93.8 kg   Physical Exam: Gen:      No acute  distress, chronically ill appearing vented female HEENT:  EOMI, sclera anicteric Neck:     No masses; no thyromegaly, trach, , No JVD or LAD Lungs:    Bilateral chest excursion; Clear with few rhonchi, normal respiratory effort, CV:         Regular rate and rhythm; no murmurs Abd:      + bowel sounds; soft, non-tender; no palpable masses, no distension, PEG Ext:    Trace edema; adequate peripheral perfusion Skin:      Warm and dry; no rash, intact Neuro:  Unresponsive, comatose  Resolved Hospital Problem list     Assessment & Plan:   Anoxic encephalopathy / anoxic brain injury Acute on chronic respiratory failure w hypercarbia and hypoxia  Severe tracheobronchomalacia Trach status Fewer episodes of desaturations with movement Minimal secretions per nursing Hx COPD  -Recurrent episodes of desats with hyperdynamic collapse of airway in spite of increasing PEEP and repositioning trach -etiology of trach occlusion, dynamic airway collapse. Associated severe dyssynchrony  Plan: Respiratory status appears to have stabilized with no more episodes of desaturation since weekend Now with status myoclonic seizures, poor prognosis for recovery She is in persistently vegetative state Avoid pressure support weans as she is unlikely to be liberated from the ventilator Ongoing discussions noted with family regarding goals of care Continue bivona trach , BD , nebs mucolytics , Singulair  Pulmonary will continue to intermittently follow   Bevelyn Ngo, MSN, AGACNP-BC [facility] See Amion for personal pager If no response to pager , please call [phone] until 7pm After 7:00 pm call Elink  [phone] 12/22/2020, 10:27 AM

## 2020-12-23 ENCOUNTER — Inpatient Hospital Stay (HOSPITAL_COMMUNITY): Payer: Medicare Other

## 2020-12-23 DIAGNOSIS — J398 Other specified diseases of upper respiratory tract: Secondary | ICD-10-CM | POA: Diagnosis not present

## 2020-12-23 LAB — GLUCOSE, CAPILLARY
Glucose-Capillary: 109 mg/dL — ABNORMAL HIGH (ref 70–99)
Glucose-Capillary: 150 mg/dL — ABNORMAL HIGH (ref 70–99)
Glucose-Capillary: 122 mg/dL — ABNORMAL HIGH (ref 70–99)
Glucose-Capillary: 119 mg/dL — ABNORMAL HIGH (ref 70–99)
Glucose-Capillary: 161 mg/dL — ABNORMAL HIGH (ref 70–99)
Glucose-Capillary: 104 mg/dL — ABNORMAL HIGH (ref 70–99)

## 2020-12-23 MED ORDER — FENTANYL CITRATE (PF) 100 MCG/2ML IJ SOLN
25.0000 ug | INTRAMUSCULAR | Status: DC | PRN
  Administered 2020-12-26: 25 ug via INTRAVENOUS
  Administered 2020-12-26 – 2021-01-03 (×9): 50 ug via INTRAVENOUS
  Administered 2021-01-05: 25 ug via INTRAVENOUS
  Administered 2021-01-05 – 2021-01-17 (×10): 50 ug via INTRAVENOUS
  Administered 2021-01-18: 25 ug via INTRAVENOUS
  Administered 2021-01-18 – 2021-01-23 (×4): 50 ug via INTRAVENOUS
  Administered 2021-01-23: 25 ug via INTRAVENOUS
  Administered 2021-01-25: 50 ug via INTRAVENOUS
  Filled 2020-12-23 (×26): qty 2

## 2020-12-23 NOTE — Progress Notes (Signed)
eLink Physician-Brief Progress Note Patient Name: Sonia Drake DOB: Nov 13, 1955 MRN: [id number]   Date of Service  12/23/2020  HPI/Events of Note  Chest X-ray reviewed, no acute disease.  eICU Interventions  No intervention.        Thomasene Lot [name] 12/23/2020, 6:45 AM

## 2020-12-23 NOTE — Progress Notes (Signed)
Pt with labored breathing on ventilator and is dysinchronus with ventilator. Pt hypertensive. PRN ativan given and is ineffective. Dr. Val Eagle made aware and orders received for prn fentanyl.

## 2020-12-23 NOTE — Plan of Care (Signed)
  Problem: Health Behavior/Discharge Planning: Goal: Ability to manage health-related needs will improve Outcome: Progressing   Problem: Clinical Measurements: Goal: Ability to maintain clinical measurements within normal limits will improve Outcome: Progressing Goal: Will remain free from infection Outcome: Progressing Goal: Diagnostic test results will improve Outcome: Progressing Goal: Respiratory complications will improve Outcome: Progressing Goal: Cardiovascular complication will be avoided Outcome: Progressing   Problem: Activity: Goal: Risk for activity intolerance will decrease Outcome: Progressing   Problem: Elimination: Goal: Will not experience complications related to bowel motility Outcome: Progressing Goal: Will not experience complications related to urinary retention Outcome: Progressing   Problem: Pain Managment: Goal: General experience of comfort will improve Outcome: Progressing   Problem: Safety: Goal: Ability to remain free from injury will improve Outcome: Progressing   Problem: Skin Integrity: Goal: Risk for impaired skin integrity will decrease Outcome: Progressing   Problem: Activity: Goal: Ability to tolerate increased activity will improve Outcome: Progressing   Problem: Respiratory: Goal: Ability to maintain a clear airway and adequate ventilation will improve Outcome: Progressing   Problem: Activity: Goal: Ability to tolerate increased activity will improve Outcome: Progressing   Problem: Health Behavior/Discharge Planning: Goal: Ability to manage tracheostomy will improve Outcome: Progressing   Problem: Respiratory: Goal: Patent airway maintenance will improve Outcome: Progressing

## 2020-12-23 NOTE — Progress Notes (Addendum)
 PROGRESS NOTE  Sonia Drake MRN:[id number] DOB: 08/07/1955 DOA: 10/15/2020 PCP: Amin, Saad, MD  HPI/Recap of past 24 hours: Subjective: December 23, 2020 Patient seen and examined at bedside patient is nonresponsive she is on the ventilator with a tracheostomy  Assessment/Plan: Principal Problem:   Sepsis (HCC) Active Problems:   COPD (chronic obstructive pulmonary disease) (HCC)   Chronic diastolic CHF (congestive heart failure) (HCC)   Acute on chronic respiratory failure with hypoxia (HCC)   GERD (gastroesophageal reflux disease)   Seizures (HCC)   Type 2 diabetes mellitus without complication, with long-term current use of insulin (HCC)   Severe hypoxic-ischemic encephalopathy   Tracheostomy dependence (HCC)   Pressure injury of skin   Difficult airway   Ventilator dependence (HCC)   Acquired tracheal collapse   Anoxic brain injury (HCC)   MRSA bacteremia   VRE infection (vancomycin resistant Enterococcus)   Klebsiella cystitis   Bacteremia  1.  Acute on chronic respiratory failure with hypoxemia tracheostomy with vent dependent secondary to intracranial bronchial malacia and advanced COPD/asthma.  Patient is currently on the vent with tracheostomy Critical care is still managing t Critical care is said to have spoken to the daughter about the chronicity of the medical problem and futility of treatment.  From the note it seems that patient's daughter opted to continue current aggressive care  2.  Seizure disorder Chronic brain anoxic brain injury Continue Keppra clonazepam and Depakote  4.  History of cardiac arrest secondary to acute hypoxia in August 2021  5.  MDR bacteremia secondary to staph epididymis I, colonization of VRE and Klebsiella in urine patient has completed 7 days of IV vancomycin    Code Status: Patient is full code  Severity of Illness: The appropriate patient status for this patient is INPATIENT. Inpatient status is judged to be reasonable  and necessary in order to provide the required intensity of service to ensure the patient's safety. The patient's presenting symptoms, physical exam findings, and initial radiographic and laboratory data in the context of their chronic comorbidities is felt to place them at high risk for further clinical deterioration. Furthermore, it is not anticipated that the patient will be medically stable for discharge from the hospital within 2 midnights of admission. The following factors support the patient status of inpatient.   " Intubated nonresponsive   * I certify that at the point of admission it is my clinical judgment that the patient will require inpatient hospital care spanning beyond 2 midnights from the point of admission due to high intensity of service, high risk for further deterioration and high frequency of surveillance required.*   Family Communication: None at bedside  Disposition Plan: To be determined Status is: Inpatient   Dispo: The patient is from: Home              Anticipated d/c is to:               Anticipated d/c date is:               Patient currently not medically stable for discharge  Consultants: Critical care  Procedures: Intubation  Antimicrobials: None Patient was on vancomycin  DVT prophylaxis: Lovenox   Objective: Vitals:   12/23/20 1417 12/23/20 1500 12/23/20 1600 12/23/20 1700  BP:  (!) 84/73 118/80 104/74  Pulse:  76 85 77  Resp:  16 16 16  Temp:      TempSrc:      SpO2: 100% 100% 100% 100%    Weight:      Height:        Intake/Output Summary (Last 24 hours) at 12/23/2020 1816 Last data filed at 12/23/2020 1700 Gross per 24 hour  Intake 1695 ml  Output 1300 ml  Net 395 ml   Filed Weights   12/21/20 0143 12/22/20 0400 12/23/20 0500  Weight: 91.8 kg 93.8 kg 92.5 kg   Body mass index is 37.3 kg/m.  Exam:  General: 64 y.o. year-old female well developed well nourished in no acute distress.  Obtunded and nonresponsive  ,obese Cardiovascular: Regular rate and rhythm with no rubs or gallops.  No thyromegaly or JVD noted.   Respiratory: Clear to auscultation with no wheezes or rales. Good inspiratory effort. Abdomen: Soft nontender nondistended with normal bowel sounds x4 quadrants. Musculoskeletal: No lower extremity edema. 2/4 pulses in all 4 extremities. Skin: No ulcerative lesions noted or rashes, Psychiatry: Mood is appropriate for condition and setting Neurology: Patient not making any movement Glabellar tap with no elicited any response Occasionally she was moving her mouth opening and closing it involuntarily    Data Reviewed: CBC: Recent Labs  Lab 12/17/20 0159  WBC 6.2  NEUTROABS 3.4  HGB 9.5*  HCT 30.9*  MCV 88.3  PLT 206   Basic Metabolic Panel: Recent Labs  Lab 12/17/20 0159 12/22/20 0227  NA 138 139  K 3.8 3.9  CL 104 103  CO2 27 29  GLUCOSE 110* 102*  BUN 22 14  CREATININE 0.37* 0.41*  CALCIUM 10.1 10.3   GFR: Estimated Creatinine Clearance: 73.3 mL/min (A) (by C-G formula based on SCr of 0.41 mg/dL (L)). Liver Function Tests: No results for input(s): AST, ALT, ALKPHOS, BILITOT, PROT, ALBUMIN in the last 168 hours. No results for input(s): LIPASE, AMYLASE in the last 168 hours. No results for input(s): AMMONIA in the last 168 hours. Coagulation Profile: No results for input(s): INR, PROTIME in the last 168 hours. Cardiac Enzymes: No results for input(s): CKTOTAL, CKMB, CKMBINDEX, TROPONINI in the last 168 hours. BNP (last 3 results) No results for input(s): PROBNP in the last 8760 hours. HbA1C: No results for input(s): HGBA1C in the last 72 hours. CBG: Recent Labs  Lab 12/22/20 2325 12/23/20 0348 12/23/20 0734 12/23/20 1111 12/23/20 1520  GLUCAP 117* 109* 150* 122* 119*   Lipid Profile: No results for input(s): CHOL, HDL, LDLCALC, TRIG, CHOLHDL, LDLDIRECT in the last 72 hours. Thyroid Function Tests: No results for input(s): TSH, T4TOTAL, FREET4, T3FREE,  THYROIDAB in the last 72 hours. Anemia Panel: No results for input(s): VITAMINB12, FOLATE, FERRITIN, TIBC, IRON, RETICCTPCT in the last 72 hours. Urine analysis:    Component Value Date/Time   COLORURINE YELLOW 12/13/2020 1730   APPEARANCEUR CLEAR 12/13/2020 1730   LABSPEC 1.010 12/13/2020 1730   PHURINE 8.0 12/13/2020 1730   GLUCOSEU NEGATIVE 12/13/2020 1730   HGBUR NEGATIVE 12/13/2020 1730   BILIRUBINUR NEGATIVE 12/13/2020 1730   KETONESUR NEGATIVE 12/13/2020 1730   PROTEINUR NEGATIVE 12/13/2020 1730   UROBILINOGEN 0.2 07/03/2014 1456   NITRITE NEGATIVE 12/13/2020 1730   LEUKOCYTESUR NEGATIVE 12/13/2020 1730   Sepsis Labs: @LABRCNTIP(procalcitonin:4,lacticidven:4)  ) Recent Results (from the past 240 hour(s))  Culture, blood (Routine X 2) w Reflex to ID Panel     Status: Abnormal (Preliminary result)   Collection Time: 12/14/20  7:57 AM   Specimen: BLOOD LEFT HAND  Result Value Ref Range Status   Specimen Description BLOOD LEFT HAND  Final   Special Requests   Final    BOTTLES DRAWN   AEROBIC AND ANAEROBIC Blood Culture adequate volume   Culture  Setup Time   Final    GRAM POSITIVE COCCI IN CLUSTERS IN BOTH AEROBIC AND ANAEROBIC BOTTLES CRITICAL VALUE NOTED.  VALUE IS CONSISTENT WITH PREVIOUSLY REPORTED AND CALLED VALUE. Performed at [facility], [address] [place], [place] [address]    Culture (A)  Final    STAPHYLOCOCCUS CAPITIS STAPHYLOCOCCUS EPIDERMIDIS    Report Status PENDING  Incomplete   Organism ID, Bacteria STAPHYLOCOCCUS EPIDERMIDIS  Final   Organism ID, Bacteria STAPHYLOCOCCUS CAPITIS  Final      Susceptibility   Staphylococcus capitis - MIC*    CIPROFLOXACIN >=8 RESISTANT Resistant     ERYTHROMYCIN >=8 RESISTANT Resistant     GENTAMICIN 8 INTERMEDIATE Intermediate     OXACILLIN >=4 RESISTANT Resistant     TETRACYCLINE <=1 SENSITIVE Sensitive     VANCOMYCIN <=0.5 SENSITIVE Sensitive     TRIMETH/SULFA <=10 SENSITIVE Sensitive     CLINDAMYCIN  >=8 RESISTANT Resistant     RIFAMPIN <=0.5 SENSITIVE Sensitive     Inducible Clindamycin NEGATIVE Sensitive     * STAPHYLOCOCCUS CAPITIS   Staphylococcus epidermidis - MIC*    CIPROFLOXACIN 2 INTERMEDIATE Intermediate     ERYTHROMYCIN >=8 RESISTANT Resistant     GENTAMICIN >=16 RESISTANT Resistant     OXACILLIN >=4 RESISTANT Resistant     TETRACYCLINE 2 SENSITIVE Sensitive     VANCOMYCIN 1 SENSITIVE Sensitive     TRIMETH/SULFA 80 RESISTANT Resistant     CLINDAMYCIN >=8 RESISTANT Resistant     RIFAMPIN <=0.5 SENSITIVE Sensitive     Inducible Clindamycin NEGATIVE Sensitive     * STAPHYLOCOCCUS EPIDERMIDIS  Culture, blood (Routine X 2) w Reflex to ID Panel     Status: None   Collection Time: 12/14/20  8:04 AM   Specimen: BLOOD RIGHT HAND  Result Value Ref Range Status   Specimen Description BLOOD RIGHT HAND  Final   Special Requests   Final    BOTTLES DRAWN AEROBIC AND ANAEROBIC Blood Culture adequate volume   Culture   Final    NO GROWTH 5 DAYS Performed at [facility], [address] [place], [place] [address]    Report Status 12/19/2020 FINAL  Final  Culture, blood (routine x 2)     Status: None   Collection Time: 12/16/20  3:07 PM   Specimen: BLOOD LEFT HAND  Result Value Ref Range Status   Specimen Description BLOOD LEFT HAND  Final   Special Requests   Final    BOTTLES DRAWN AEROBIC ONLY Blood Culture results may not be optimal due to an inadequate volume of blood received in culture bottles   Culture   Final    NO GROWTH 5 DAYS Performed at [facility], [address] [place], [place] [address]    Report Status 12/21/2020 FINAL  Final  Culture, blood (routine x 2)     Status: None   Collection Time: 12/16/20  3:16 PM   Specimen: BLOOD RIGHT HAND  Result Value Ref Range Status   Specimen Description BLOOD RIGHT HAND  Final   Special Requests   Final    BOTTLES DRAWN AEROBIC ONLY Blood Culture results may not be optimal due to an inadequate volume of  blood received in culture bottles   Culture   Final    NO GROWTH 5 DAYS Performed at  Hospital Lab, [address] [place], [place] [address]    Report Status   12/21/2020 FINAL  Final  MRSA Next Gen by PCR, Nasal     Status: None   Collection Time: 12/18/20  1:16 PM   Specimen: Nasal Mucosa; Nasal Swab  Result Value Ref Range Status   MRSA by PCR Next Gen NOT DETECTED NOT DETECTED Final    Comment: (NOTE) The GeneXpert MRSA Assay (FDA approved for NASAL specimens only), is one component of a comprehensive MRSA colonization surveillance program. It is not intended to diagnose MRSA infection nor to guide or monitor treatment for MRSA infections. Test performance is not FDA approved in patients less than 87 years old. Performed at [facility], [place] [address], [place], [place] [address]       Studies: DG Chest Port 1 View  Result Date: 12/23/2020 CLINICAL DATA:  Respiratory failure and ARDS EXAM: PORTABLE CHEST 1 VIEW COMPARISON:  12/07/2020 FINDINGS: Retrocardiac chronic linear opacity. Normal heart size and mediastinal contours. Tracheostomy tube in expected position. No acute osseous finding. IMPRESSION: Mild atelectasis or scarring. No acute finding when compared to priors. Electronically Signed   By: Monte Fantasia M.D.   On: 12/23/2020 06:48    Scheduled Meds:  arformoterol  15 mcg Nebulization BID   baclofen  5 mg Per Tube TID   budesonide  0.5 mg Nebulization BID   chlorhexidine gluconate (MEDLINE KIT)  15 mL Mouth Rinse BID   Chlorhexidine Gluconate Cloth  6 each Topical Q2000   clonazePAM  1 mg Per Tube BID   clopidogrel  75 mg Per Tube Daily   enoxaparin (LOVENOX) injection  0.5 mg/kg Subcutaneous Q24H   famotidine  20 mg Per Tube BID   feeding supplement (PROSource TF)  45 mL Per Tube BID   fiber  1 packet Per Tube BID   free water  200 mL Per Tube Q4H   Gerhardt's butt cream   Topical BID   guaiFENesin  5 mL Per Tube Q6H   hydrocortisone cream   Topical  TID   insulin aspart  0-15 Units Subcutaneous Q4H   insulin glargine-yfgn  20 Units Subcutaneous QHS   levETIRAcetam  2,500 mg Per Tube BID   loratadine  10 mg Per Tube Daily   mouth rinse  15 mL Mouth Rinse 10 times per day   montelukast  10 mg Per Tube Daily   revefenacin  175 mcg Nebulization Daily   tiZANidine  2 mg Per Tube QHS   valproic acid  1,000 mg Per Tube Q12H    Continuous Infusions:  sodium chloride 10 mL/hr at 12/22/20 0400   feeding supplement (JEVITY 1.5 CAL/FIBER) 1,000 mL (12/22/20 1645)     LOS: 83 days     Cristal Deer, MD [facility]  To reach me or the doctor on call, go to: [email or web address] Password [facility]  12/23/2020, 6:16 PM

## 2020-12-23 NOTE — Progress Notes (Signed)
eLink Physician-Brief Progress Note Patient Name: Sonia Drake DOB: [birth year] MRN: [id number]   Date of Service  12/23/2020  HPI/Events of Note  Patient with tachypnea and increased work of breathing. Saturation is maintained at 97  %.  eICU Interventions  Stat portable CXR ordered.        Thomasene Lot [name] 12/23/2020, 6:07 AM

## 2020-12-24 DIAGNOSIS — J398 Other specified diseases of upper respiratory tract: Secondary | ICD-10-CM | POA: Diagnosis not present

## 2020-12-24 DIAGNOSIS — J9621 Acute and chronic respiratory failure with hypoxia: Secondary | ICD-10-CM | POA: Diagnosis not present

## 2020-12-24 LAB — CBC
HCT: 34.6 % — ABNORMAL LOW (ref 36.0–46.0)
Hemoglobin: 10.4 g/dL — ABNORMAL LOW (ref 12.0–15.0)
MCH: 27.3 pg (ref 26.0–34.0)
MCHC: 30.1 g/dL (ref 30.0–36.0)
MCV: 90.8 fL (ref 80.0–100.0)
Platelets: 219 10*3/uL (ref 150–400)
RBC: 3.81 MIL/uL — ABNORMAL LOW (ref 3.87–5.11)
RDW: 17.2 % — ABNORMAL HIGH (ref 11.5–15.5)
WBC: 10.8 10*3/uL — ABNORMAL HIGH (ref 4.0–10.5)
nRBC: 0 % (ref 0.0–0.2)

## 2020-12-24 LAB — BASIC METABOLIC PANEL
Anion gap: 9 (ref 5–15)
BUN: 15 mg/dL (ref 8–23)
CO2: 26 mmol/L (ref 22–32)
Calcium: 10.5 mg/dL — ABNORMAL HIGH (ref 8.9–10.3)
Chloride: 103 mmol/L (ref 98–111)
Creatinine, Ser: 0.37 mg/dL — ABNORMAL LOW (ref 0.44–1.00)
GFR, Estimated: >60 mL/min (ref >60)
Glucose, Bld: 105 mg/dL — ABNORMAL HIGH (ref 70–99)
Potassium: 4.5 mmol/L (ref 3.5–5.1)
Sodium: 138 mmol/L (ref 135–145)

## 2020-12-24 LAB — GLUCOSE, CAPILLARY
Glucose-Capillary: 100 mg/dL — ABNORMAL HIGH (ref 70–99)
Glucose-Capillary: 110 mg/dL — ABNORMAL HIGH (ref 70–99)
Glucose-Capillary: 115 mg/dL — ABNORMAL HIGH (ref 70–99)
Glucose-Capillary: 116 mg/dL — ABNORMAL HIGH (ref 70–99)
Glucose-Capillary: 125 mg/dL — ABNORMAL HIGH (ref 70–99)
Glucose-Capillary: 126 mg/dL — ABNORMAL HIGH (ref 70–99)

## 2020-12-24 LAB — MAGNESIUM: Magnesium: 1.9 mg/dL (ref 1.7–2.4)

## 2020-12-24 NOTE — Progress Notes (Addendum)
PROGRESS NOTE  Sonia Drake WHQ:[id number] DOB: [birth year] DOA: 10/02/2020 PCP: Garwin Brothers, MD  HPI/Recap of past 24 hours: Subjective: December 23, 2020 Patient seen and examined at bedside patient is nonresponsive she is on the ventilator with a tracheostomy  December 24, 2020: Patient seen and examined at bedside Nurse complained of increased secretion from the tracheostomy. Continue respiratory care and critical care will be managing  Assessment/Plan: Principal Problem:   Sepsis ([place]) Active Problems:   COPD (chronic obstructive pulmonary disease) (HCC)   Chronic diastolic CHF (congestive heart failure) (HCC)   Acute on chronic respiratory failure with hypoxia (HCC)   GERD (gastroesophageal reflux disease)   Seizures (HCC)   Type 2 diabetes mellitus without complication, with long-term current use of insulin (HCC)   Severe hypoxic-ischemic encephalopathy   Tracheostomy dependence (HCC)   Pressure injury of skin   Difficult airway   Ventilator dependence (HCC)   Acquired tracheal collapse   Anoxic brain injury ([place])   MRSA bacteremia   VRE infection (vancomycin resistant Enterococcus)   Klebsiella cystitis   Bacteremia  1.  Acute on chronic respiratory failure with hypoxemia tracheostomy with vent dependent secondary to intracranial bronchial malacia and advanced COPD/asthma.  Patient is currently on the vent with tracheostomy Critical care is still managing t Critical care is said to have spoken to the daughter about the chronicity of the medical problem and futility of treatment.  From the note it seems that patient's daughter opted to continue current aggressive care  2.  Seizure disorder Chronic brain anoxic brain injury Continue Keppra clonazepam and Depakote  4.  History of cardiac arrest secondary to acute hypoxia in August 2021  5.  MDR bacteremia secondary to staph epididymis I, colonization of VRE and Klebsiella in urine patient has completed 7 days of  IV vancomycin    Code Status: Patient is full code  Severity of Illness: The appropriate patient status for this patient is INPATIENT. Inpatient status is judged to be reasonable and necessary in order to provide the required intensity of service to ensure the patient's safety. The patient's presenting symptoms, physical exam findings, and initial radiographic and laboratory data in the context of their chronic comorbidities is felt to place them at high risk for further clinical deterioration. Furthermore, it is not anticipated that the patient will be medically stable for discharge from the hospital within 2 midnights of admission. The following factors support the patient status of inpatient.   " Intubated nonresponsive   * I certify that at the point of admission it is my clinical judgment that the patient will require inpatient hospital care spanning beyond 2 midnights from the point of admission due to high intensity of service, high risk for further deterioration and high frequency of surveillance required.*   Family Communication: None at bedside  Disposition Plan: To be determined Status is: Inpatient   Dispo: The patient is from: Home              Anticipated d/c is to:               Anticipated d/c date is:               Patient currently not medically stable for discharge  Consultants: Critical care  Procedures: Intubation  Antimicrobials: None Patient was on vancomycin  DVT prophylaxis: Lovenox   Objective: Vitals:   12/24/20 1820 12/24/20 1900 12/24/20 1902 12/24/20 2000  BP:  100/62  138/90  Pulse: 68 65  87  Resp: 16 16  (!) 21  Temp:   97.7 F (36.5 C)   TempSrc:   Axillary   SpO2: 100% 100%  100%  Weight:      Height:        Intake/Output Summary (Last 24 hours) at 12/24/2020 2058 Last data filed at 12/24/2020 1700 Gross per 24 hour  Intake 450 ml  Output 2400 ml  Net -1950 ml    Filed Weights   12/21/20 0143 12/22/20 0400 12/23/20 0500   Weight: 91.8 kg 93.8 kg 92.5 kg   Body mass index is 37.3 kg/m.  Exam:  General: 65 y.o. year-old female well developed well nourished in no acute distress.  Obtunded and nonresponsive Cardiovascular: Regular rate and rhythm with no rubs or gallops.  No thyromegaly or JVD noted.   Respiratory: Clear to auscultation with no wheezes or rales. Good inspiratory effort. Abdomen: Soft nontender nondistended with normal bowel sounds x4 quadrants. Musculoskeletal: No lower extremity edema. 2/4 pulses in all 4 extremities. Skin: No ulcerative lesions noted or rashes, Psychiatry: Mood is appropriate for condition and setting Neurology: Patient not making any movement Glabellar tap with no elicited any response Occasionally she was moving her mouth opening and closing it involuntarily    Data Reviewed: CBC: Recent Labs  Lab 12/24/20 0256  WBC 10.8*  HGB 10.4*  HCT 34.6*  MCV 90.8  PLT 646    Basic Metabolic Panel: Recent Labs  Lab 12/22/20 0227 12/24/20 0256  NA 139 138  K 3.9 4.5  CL 103 103  CO2 29 26  GLUCOSE 102* 105*  BUN 14 15  CREATININE 0.41* 0.37*  CALCIUM 10.3 10.5*  MG  --  1.9    GFR: Estimated Creatinine Clearance: 73.3 mL/min (A) (by C-G formula based on SCr of 0.37 mg/dL (L)). Liver Function Tests: No results for input(s): AST, ALT, ALKPHOS, BILITOT, PROT, ALBUMIN in the last 168 hours. No results for input(s): LIPASE, AMYLASE in the last 168 hours. No results for input(s): AMMONIA in the last 168 hours. Coagulation Profile: No results for input(s): INR, PROTIME in the last 168 hours. Cardiac Enzymes: No results for input(s): CKTOTAL, CKMB, CKMBINDEX, TROPONINI in the last 168 hours. BNP (last 3 results) No results for input(s): PROBNP in the last 8760 hours. HbA1C: No results for input(s): HGBA1C in the last 72 hours. CBG: Recent Labs  Lab 12/24/20 0329 12/24/20 0724 12/24/20 1107 12/24/20 1502 12/24/20 1902  GLUCAP 110* 100* 125* 115* 116*     Lipid Profile: No results for input(s): CHOL, HDL, LDLCALC, TRIG, CHOLHDL, LDLDIRECT in the last 72 hours. Thyroid Function Tests: No results for input(s): TSH, T4TOTAL, FREET4, T3FREE, THYROIDAB in the last 72 hours. Anemia Panel: No results for input(s): VITAMINB12, FOLATE, FERRITIN, TIBC, IRON, RETICCTPCT in the last 72 hours. Urine analysis:    Component Value Date/Time   COLORURINE YELLOW 12/13/2020 1730   APPEARANCEUR CLEAR 12/13/2020 1730   LABSPEC 1.010 12/13/2020 1730   PHURINE 8.0 12/13/2020 1730   GLUCOSEU NEGATIVE 12/13/2020 1730   HGBUR NEGATIVE 12/13/2020 1730   BILIRUBINUR NEGATIVE 12/13/2020 1730   KETONESUR NEGATIVE 12/13/2020 1730   PROTEINUR NEGATIVE 12/13/2020 1730   UROBILINOGEN 0.2 07/03/2014 1456   NITRITE NEGATIVE 12/13/2020 1730   LEUKOCYTESUR NEGATIVE 12/13/2020 1730   Sepsis Labs: _0 (procalcitonin:4,lacticidven:4)  ) Recent Results (from the past 240 hour(s))  Culture, blood (routine x 2)     Status: None   Collection Time: 12/16/20  3:07 PM   Specimen: BLOOD  LEFT HAND  Result Value Ref Range Status   Specimen Description BLOOD LEFT HAND  Final   Special Requests   Final    BOTTLES DRAWN AEROBIC ONLY Blood Culture results may not be optimal due to an inadequate volume of blood received in culture bottles   Culture   Final    NO GROWTH 5 DAYS Performed at [facility], [place] [address] [place], [place] [address]    Report Status 12/21/2020 FINAL  Final  Culture, blood (routine x 2)     Status: None   Collection Time: 12/16/20  3:16 PM   Specimen: BLOOD RIGHT HAND  Result Value Ref Range Status   Specimen Description BLOOD RIGHT HAND  Final   Special Requests   Final    BOTTLES DRAWN AEROBIC ONLY Blood Culture results may not be optimal due to an inadequate volume of blood received in culture bottles   Culture   Final    NO GROWTH 5 DAYS Performed at [facility], [place] [address] [place], [place] [address]    Report  Status 12/21/2020 FINAL  Final  MRSA Next Gen by PCR, Nasal     Status: None   Collection Time: 12/18/20  1:16 PM   Specimen: Nasal Mucosa; Nasal Swab  Result Value Ref Range Status   MRSA by PCR Next Gen NOT DETECTED NOT DETECTED Final    Comment: (NOTE) The GeneXpert MRSA Assay (FDA approved for NASAL specimens only), is one component of a comprehensive MRSA colonization surveillance program. It is not intended to diagnose MRSA infection nor to guide or monitor treatment for MRSA infections. Test performance is not FDA approved in patients less than 35 years old. Performed at [facility], [place] [address] [place], [place] [address]       Studies: No results found.  Scheduled Meds:  arformoterol  15 mcg Nebulization BID   baclofen  5 mg Per Tube TID   budesonide  0.5 mg Nebulization BID   chlorhexidine gluconate (MEDLINE KIT)  15 mL Mouth Rinse BID   Chlorhexidine Gluconate Cloth  6 each Topical Q2000   clonazePAM  1 mg Per Tube BID   clopidogrel  75 mg Per Tube Daily   enoxaparin (LOVENOX) injection  0.5 mg/kg Subcutaneous Q24H   famotidine  20 mg Per Tube BID   feeding supplement (PROSource TF)  45 mL Per Tube BID   fiber  1 packet Per Tube BID   free water  200 mL Per Tube Q4H   Gerhardt's butt cream   Topical BID   guaiFENesin  5 mL Per Tube Q6H   hydrocortisone cream   Topical TID   insulin aspart  0-15 Units Subcutaneous Q4H   insulin glargine-yfgn  20 Units Subcutaneous QHS   levETIRAcetam  2,500 mg Per Tube BID   loratadine  10 mg Per Tube Daily   mouth rinse  15 mL Mouth Rinse 10 times per day   montelukast  10 mg Per Tube Daily   revefenacin  175 mcg Nebulization Daily   tiZANidine  2 mg Per Tube QHS   valproic acid  1,000 mg Per Tube Q12H    Continuous Infusions:  sodium chloride 10 mL/hr at 12/22/20 0400   feeding supplement (JEVITY 1.5 CAL/FIBER) 1,000 mL (12/22/20 1645)     LOS: 84 days     Cristal Deer, MD [facility]  To  reach me or the doctor on call, go to: [email or web address] Password [email or web address]  12/24/2020, 8:58 PM

## 2020-12-24 NOTE — Progress Notes (Signed)
NAME:  Sonia Drake, MRN:  [id number], DOB:  1956/04/17, LOS: 84 ADMISSION DATE:  09/20/2020, CONSULTATION DATE:  6/15 REFERRING MD:  Thedore Mins, CHIEF COMPLAINT:  Dyspnea   History of Present Illness:  65 y/o female with anoxic brain injury, tracheostomy status who was admitted from a SNF on 6/12 with worsening respiratory failure.  She had previously been ventilator dependent but had been weaned from the trach.  On 6/16 a bronchoscopy revealed dynamic collapse of her trachea, tracheostomy was replaced.  Later required mechanical ventilation.  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 No distress. WBC ct trending down. No events overnight growing GPC clusters in two of two cultures  6/16 increased work of breathing, upper airway noise, abdominal muscle use.  Bronchoscopy performed as trach exchange cuffless #8 Shiley XLT showed dynamic airway collapse, tracheal collapse. 6/16 continued respiratory distress moved to ICU, changed to cuffed #6 Shiley XLT, placed on MV with some improved comfort 6/12 cefepime > 6/14, 6/18 6/12 vanc > 6/14, 6/18 6/23 mucus plugging, severe vent dyssynchrony 6/24-25 intermittent jerking, seizures on EEG 6/27 weaned off propofol gtt 6/28 briefly tolerated trach collar in AM 6/29 tolerated trach collar in AM; no further seizure like activity; working with CM/SW for vent-SNF 7/1 Weaning on CPAP/ PS 10/5, CM SW working on [employer], NO seizure activity, secretions are not an issue per  nursing 7/11 trach changed to #8 Bivona with air cuff, much improved. 7/12 TCT 12 hours; rested on Vent overnight 7/13 on TCT 7/23 Trach collar tolerated 7/24 Full vent support 7/26>> Remains on full vent support this am, no issues with secretions per nursing 8/5 occluding trach with dynamic collapse: seen on bronchoscopy 8/6 called back emergently to bedside for tracheostomy occlusion: emergent bronch, bivona trach migrated back 2cm, improved with advancing to 2cm above carina 8/8 collapse caused hypoxia. Improved with repositioning  8/9 afternoon had another episode of tracheal collapse and occlusion of trach requiring BVM, sedation. Family produced paperwork which seems to have conflicting information about pts wishes  8/11: pt continues to have multiple daily episodes of tracheal collapse/mucous plugging req lavage, BVM. 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 Neuro consulted 8/30 overnight , BP drop which responded to fluid bolus 9/2 Continues to have labile BP, now midodrine prn  Interim History / Subjective:   Respiratory distress last evening Chest x-ray within normal limits Objective   Blood pressure 118/85, pulse 89, temperature 98 F (36.7 C), temperature source Axillary, resp. rate 18, height 5\' 2"  (1.575 m), weight 92.5 kg, SpO2 97 %.    Vent Mode: PRVC FiO2 (%):  [30 %] 30 % Set Rate:  [16 bmp] 16 bmp Vt Set:  [400 mL] 400 mL PEEP:  [5 cmH20] 5 cmH20 Plateau Pressure:  [15 cmH20-31 cmH20] 31 cmH20   Intake/Output Summary (Last 24 hours) at 12/24/2020 1051 Last data filed at 12/24/2020 0834 Gross per 24 hour  Intake 855 ml  Output 1700 ml  Net -845 ml   Filed Weights   12/21/20 0143  12/22/20 0400 12/23/20 0500  Weight: 91.8 kg 93.8 kg 92.5 kg   Physical Exam: Gen:      In no acute distress, chronically ill-appearing  HEENT:  Anicteric Neck:     No  thyromegaly Lungs:    Clear breath sounds anteriorly CV:         S1-S2 appreciated Abd:      + bowel sounds; soft, non-tender; no palpable masses, no distension, PEG Ext:    Trace edema; adequate peripheral perfusion Skin:      Warm and dry; no rash, intact Neuro:  Unresponsive, comatose  Resolved Hospital Problem list     Assessment & Plan:   Anoxic encephalopathy/anoxic brain injury Acute on chronic respiratory failure with hypercarbia and hypoxia Severe tracheal bronchomalacia Tracheostomy dependent History of COPD -Dynamic airway collapse Continue to monitor closely  Persistent vegetative state Not likely to be able to wean from ventilator Bivona trach Bronchodilators, mucolytics Nebulization treatments  Pulmonary will follow intermittently  Virl Diamond, MD [facility] Pager: See Loretha Stapler

## 2020-12-25 DIAGNOSIS — J9621 Acute and chronic respiratory failure with hypoxia: Secondary | ICD-10-CM | POA: Diagnosis not present

## 2020-12-25 DIAGNOSIS — G931 Anoxic brain damage, not elsewhere classified: Secondary | ICD-10-CM | POA: Diagnosis not present

## 2020-12-25 DIAGNOSIS — J398 Other specified diseases of upper respiratory tract: Secondary | ICD-10-CM | POA: Diagnosis not present

## 2020-12-25 DIAGNOSIS — R7881 Bacteremia: Secondary | ICD-10-CM | POA: Diagnosis not present

## 2020-12-25 LAB — GLUCOSE, CAPILLARY
Glucose-Capillary: 103 mg/dL — ABNORMAL HIGH (ref 70–99)
Glucose-Capillary: 115 mg/dL — ABNORMAL HIGH (ref 70–99)
Glucose-Capillary: 110 mg/dL — ABNORMAL HIGH (ref 70–99)
Glucose-Capillary: 111 mg/dL — ABNORMAL HIGH (ref 70–99)
Glucose-Capillary: 135 mg/dL — ABNORMAL HIGH (ref 70–99)

## 2020-12-25 NOTE — Progress Notes (Signed)
[facility] PROGRESS NOTE  ABIAGEAL BLOWE OMV:[id number] DOB: 02-27-1956 DOA: 09/23/2020 PCP: Garwin Brothers, MD  Status: Remains inpatient appropriate because:Altered mental status, Unsafe d/c plan, and Inpatient level of care appropriate due to severity of illness  Dispo: The patient is from: SNF              Anticipated d/c is to: SNF Ventilator but due to the fact she has an adjustable trach and given her recurrent airway issues as described above have received no bed offers              Patient currently is not medically stable to d/c.   Difficult to place patient Yes   Level of care: ICU  CODE STATUS: Full Family Communication: Spoke with patient's daughter in detail on 8/17.  8/26 updated daughter on new diagnosis of MRSE bacteremia. DVT prophylaxis: Lovenox COVID vaccination status: Per H&P patient is not vaccinated   The following pertains to patient's living will dated June 22nd, 2016: 1) no sedative medications to keep her asleep.  She wants to be fully awake regardless of pain 2) she requested all means to keep her alive.  She wants to remain on life support until her daughter, [name] and the grandchildren decide otherwise after consulting with the medical team 3) she also hand wrote that she wishes to be allowed 74 to 90 days to see if she can "pull through"  HPI: 65 y/o female with medical history significant of asthma/COPD, CHF, cardiac arrest with PEA/CPR with chronic respiratory failure on permanent trach with supplemental oxygen at 6L, type 2 DM, GERD, seizures and severe hypoxic-ischemic encephalopathy who presented to ER via EMS for respiratory distress from SNF.  She presented with findings consistent with acute aspiration pneumonitis.  She decompensated after arrival and required transfer to ICU and initiation of mechanical ventilation.  She had sepsis physiology presumed secondary to [facility] bacteremia but it was later determined that the blood culture results were  related to contaminants and patient was no longer determined to be septic   On 6/16 a bronchoscopy revealed dynamic collapse of her trachea, tracheostomy was changed from 8.0 XLT cuffless to 6.0 XLT cuffed. She now is requiring chronic ventilatory support 2/2 advanced COPD/asthma and tracheobronchomalacia. PCCM physicians updated patient's daughter on status and poor prognosis along with recommendation for chronic vent.  Palliative medicine has had multiple discussions with the daughter.  Daughter continues to request aggressive care.  Unfortunately she continues to have multiple daily events regarding tracheal collapse with mucous plugging requiring excessive lavage and suction as well as bag-valve-mask for ventilation  Patient has had reemergence of continuous seizure activity.  Initially detected by noting patient with rhythmic blinking.  Continuous EEG revealed ongoing seizure activity.  On 8/23 during exam with sternal rub patient had what appeared to be decerebrate posturing, eyes were opened and noted with sluggish response to pupils.  Not long after eyes were opened for examination patient began having forceful, rhythmic spasms of the bilateral face that appeared consistent with seizure activity.  I returned to her room 1 hour later and her eyes were open and she was blinking constantly.  She had no blink reflex when tested.  She had another facial twitch that appeared consistent with seizure activity.  RN at bedside and witnessed these findings.  Significant Hospital events: 6/12 admitted from skilled nursing facility after aspiration event and copious secretions per tracheostomy 6/13 appears comfortable currently on 35% ATC. No distress. WBC ct trending down. No  events overnight growing GPC clusters in two of two cultures  6/16 increased work of breathing, upper airway noise, abdominal muscle use.  Bronchoscopy performed as trach exchange cuffless #8 Shiley XLT showed dynamic airway collapse,  tracheal collapse. 6/16 continued respiratory distress moved to ICU, changed to cuffed #6 Shiley XLT, placed on MV with some improved comfort 6/12 cefepime > 6/14, 6/18 6/12 vanc > 6/14, 6/18 6/23 mucus plugging, severe vent dyssynchrony 6/24-25 intermittent jerking, seizures on EEG 6/27 weaned off propofol gtt 6/28 briefly tolerated trach collar in AM 6/29 tolerated trach collar in AM; no further seizure like activity; working with CM/SW for vent-SNF 7/1 Weaning on CPAP/ PS 10/5, CM SW working on [employer], NO seizure activity, secretions are not an issue per nursing 7/11 trach changed to #8 Bivona with air cuff, much improved. 7/12 TCT 12 hours; rested on Vent overnight 7/13 on TCT 7/23 Trach collar tolerated 7/24 Full vent support 7/26>> Remains on full vent support this am, no issues with secretions per nursing 8/5 occluding trach with dynamic collapse: seen on bronchoscopy 8/6 called back emergently to bedside for tracheostomy occlusion: emergent bronch, bivona trach migrated back 2cm, improved with advancing to 2cm above carina 8/8 collapse caused hypoxia. Improved with repositioning  8/9 afternoon had another episode of tracheal collapse and occlusion of trach requiring BVM, sedation. Family produced paperwork which seems to have conflicting information about pts wishes  8/11: pt continues to have multiple daily episodes of tracheal collapse/mucous plugging req lavage, BVM. Repeated discussions with family re: code status esp in light of paperwork  8/15 consultation placed with ethics committee to evaluate regarding futility of care 8/17 spoke at length with patient's daughter regarding futility of care CODE STATUS.  Daughter continues to desire aggressive measures. 8/26 through 8/27 blood cultures positive for MDR staph epidermis/capitis and is currently on IV vancomycin 8/28 urine culture positive for VRE and Klebsiella with of which are considered colonization and not active  infection. 9/1 completed recommended course of IV vancomycin 9/2 increased frequency of IV Ativan in the past 24 hours-noted increased heart rate in BP-clear if Ativan usage secondary to vent dyssynchrony or other issues.  Subjective: Unresponsive except to painful stimulus   Objective: Vitals:   12/25/20 0600 12/25/20 0718  BP: (!) 141/98   Pulse: (!) 102   Resp: (!) 22   Temp:  98.4 F (36.9 C)  SpO2: 100%     Intake/Output Summary (Last 24 hours) at 12/25/2020 0742 Last data filed at 12/25/2020 0600 Gross per 24 hour  Intake 750 ml  Output 2200 ml  Net -1450 ml   Filed Weights   12/21/20 0143 12/22/20 0400 12/23/20 0500  Weight: 91.8 kg 93.8 kg 92.5 kg    Exam:  Constitutional: Unresponsive except to extreme painful stimulus Respiratory: 8.0 cuffed Bivona air trach, ventilator settings: PRVC mode,VT 400, Rate 16, PEEP 5, PS 15, FiO2 30%; Lungs CTA .  Does have some respiratory effort over the set ventilatory rate.  His oximetry 97 to 99% Cardiovascular: SR/ST, intermittent hypertension with tachycardia overnight, stable peripheral edema, on midodrine Abdomen: Nondistended with normoactive bowel sounds. PEG tube for feedings. LBM 9/05 Skin: Stage II decubitus stable Neurologic: Remains unresponsive.  Decerebrate posturing to sternal rub.  Withdraws right side to deep painful stimulus.  Pupils: Right 2 mm and left 4 mm with very sluggish reaction Psychiatric: Eyes closed.  Unresponsive    Assessment/Plan: Acute problems: Acute on chronic respiratory failure w/ hypoxemia Tracheostomy/vent dependence 2/2 Tracheobronchomalcia and  advanced COPD/asthma Not a candidate to wean from ventilator/decannulate 2/2 unresponsive status as well as continued issues with tracheal collapse requiring adjustable trach Patient continues to have issues with intermittent breath stacking/ventilator dyssynchrony as well as tracheal collapse.   Has not tolerated PS mode given above issues  therefore no further attempts will be made to utilize this modality.  Known anoxic brain injury after cardiac arrest  Remains unresponsive except to deep painful stimulation Evaluated by neurology this admission with findings c/w PVS (vegetative state) 2/2 diffuse cerebral hypofunction without expectation of recovery to preinjury state Appreciate assistance of ethics team regarding futility of care concerns.  Discussed with palliative care team and given multiple interactions with patient's family at this juncture they feel they have nothing to offer and agree with ethics committee recommendations for pursuing futility of care.  Patient's family would like to continue aggressive care at least to the 90-day mark (84/90)  Seizure disorder Chronic condition-worsened since her anoxic brain injury Continue Keppra, clonazepam and Depakote Episodic issues related to status epilepticus LTM EEG obtained on 8/19-20 demonstrating continued seizure spikes -Per neurologist: "Of note, patient has been in persistent vegetative state and I do not believe that treating the seizures will lead to any improvement in patient's outcome.  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   Chronic diastolic dysfunction Echocardiogram this admission with grade 1 diastolic dysfunction, moderate LV hypertrophy hyperdynamic LV systolic function Not on pharmacotherapy secondary to suboptimal blood pressure readings Euvolemic   Chronic protein malnutrition secondary to sequelae from anoxic brain injury/PEG tube dependence Continue current tube feedings with Prosource Nutrition Problem: Increased nutrient needs Etiology: wound healing Signs/Symptoms: estimated needs Interventions: Tube feeding Body mass index is 37.3 kg/m.   Anemia of chronic disease Hemoglobin remained stable around 10  Stage II sacral decubitus Wound / Incision (Open or Dehisced) 10/31/20 (MASD) Moisture Associated Skin Damage Buttocks Bilateral (Active)  Date First Assessed/Time First Assessed: 10/31/20 2100   Wound Type: (MASD) Moisture Associated Skin Damage  Location: Buttocks  Location Orientation: Bilateral  Present on Admission: No    Assessments 10/31/2020  9:20 PM 12/24/2020  7:15 PM  Dressing Type None;Other (Comment) --  Site / Wound Assessment Pink Pink  Treatment Cleansed --     No Linked orders to display       Data Reviewed: Basic Metabolic Panel: Recent Labs  Lab 12/22/20 0227 12/24/20 0256  NA 139 138  K 3.9 4.5  CL 103 103  CO2 29 26  GLUCOSE 102* 105*  BUN 14 15  CREATININE 0.41* 0.37*  CALCIUM 10.3 10.5*  MG  --  1.9   Liver Function Tests: No results for input(s): AST, ALT, ALKPHOS, BILITOT, PROT, ALBUMIN in the last 168 hours.    No results for input(s): LIPASE, AMYLASE  in the last 168 hours. No results for input(s): AMMONIA in the last 168 hours. CBC: Recent Labs  Lab 12/24/20 0256  WBC 10.8*  HGB 10.4*  HCT 34.6*  MCV 90.8  PLT 219   Cardiac Enzymes: No results for input(s): CKTOTAL, CKMB, CKMBINDEX, TROPONINI in the last 168 hours. BNP (last 3 results) Recent Labs     10/04/20 0051 10/05/20 0049 10/06/20 0548  BNP 145.8* 115.3* 125.4*    ProBNP (last 3 results) No results for input(s): PROBNP in the last 8760 hours.  CBG: Recent Labs  Lab 12/24/20 1502 12/24/20 1902 12/24/20 2330 12/25/20 0321 12/25/20 0717  GLUCAP 115* 116* 126* 103* 115*    Recent Results (from the past 240 hour(s))  Culture, blood (routine x 2)     Status: None   Collection Time: 12/16/20  3:07 PM   Specimen: BLOOD LEFT HAND  Result Value Ref Range Status   Specimen Description BLOOD LEFT HAND  Final   Special Requests   Final    BOTTLES DRAWN AEROBIC ONLY Blood Culture results may not be optimal due to an inadequate volume of blood received in culture bottles   Culture   Final    NO GROWTH 5 DAYS Performed at [facility], [place] [address] [place], [place] [address]    Report Status 12/21/2020 FINAL  Final  Culture, blood (routine x 2)     Status: None   Collection Time: 12/16/20  3:16 PM   Specimen: BLOOD RIGHT HAND  Result Value Ref Range Status   Specimen Description BLOOD RIGHT HAND  Final   Special Requests   Final    BOTTLES DRAWN AEROBIC ONLY Blood Culture results may not be optimal due to an inadequate volume of blood received in culture bottles   Culture   Final    NO GROWTH 5 DAYS Performed at [facility], [place] [address], [place], [place] [address]    Report Status 12/21/2020 FINAL  Final  MRSA Next Gen by PCR, Nasal     Status: None   Collection Time: 12/18/20  1:16 PM   Specimen: Nasal Mucosa; Nasal Swab  Result Value Ref Range Status   MRSA by PCR Next Gen NOT DETECTED NOT DETECTED Final    Comment: (NOTE) The GeneXpert MRSA Assay (FDA approved for NASAL specimens only), is one component of a comprehensive MRSA colonization surveillance program. It is not intended to diagnose MRSA infection nor to guide or monitor treatment for MRSA infections. Test performance is not FDA approved in patients less than 91 years old. Performed at  [facility], [place] [address], [place], [place] [address]       Studies: No results found.  Scheduled Meds:  arformoterol  15 mcg Nebulization BID   baclofen  5 mg Per Tube TID   budesonide  0.5 mg Nebulization BID   chlorhexidine gluconate (MEDLINE KIT)  15 mL Mouth Rinse BID   Chlorhexidine Gluconate Cloth  6 each Topical Q2000   clonazePAM  1 mg Per Tube BID   clopidogrel  75 mg Per Tube Daily   enoxaparin (LOVENOX) injection  0.5 mg/kg Subcutaneous Q24H   famotidine  20 mg Per Tube BID   feeding supplement (PROSource TF)  45 mL Per Tube BID   fiber  1 packet Per Tube BID   free water  200 mL Per Tube Q4H   Gerhardt's butt cream   Topical BID   guaiFENesin  5 mL Per Tube  Q6H   hydrocortisone cream   Topical TID   insulin aspart  0-15 Units Subcutaneous Q4H   insulin glargine-yfgn  20 Units Subcutaneous QHS   levETIRAcetam  2,500 mg Per Tube BID   loratadine  10 mg Per Tube Daily   mouth rinse  15 mL Mouth Rinse 10 times per day   montelukast  10 mg Per Tube Daily   revefenacin  175 mcg Nebulization Daily   tiZANidine  2 mg Per Tube QHS   valproic acid  1,000 mg Per Tube Q12H   Continuous Infusions:  sodium chloride 10 mL/hr at 12/22/20 0400   feeding supplement (JEVITY 1.5 CAL/FIBER) 1,000 mL (12/24/20 2300)    Principal Problem:   Sepsis ([place]) Active Problems:   COPD (chronic obstructive pulmonary disease) (HCC)   Chronic diastolic CHF (congestive heart failure) (HCC)   Acute on chronic respiratory failure with hypoxia (HCC)   GERD (gastroesophageal reflux disease)   Seizures ([place])   Type 2 diabetes mellitus without complication, with long-term current use of insulin (HCC)   Severe hypoxic-ischemic encephalopathy   Tracheostomy dependence ([place])   Pressure injury of skin   Difficult airway   Ventilator dependence ([place])   Acquired tracheal collapse   Anoxic brain injury ([place])   MRSA bacteremia   VRE infection (vancomycin resistant Enterococcus)    Klebsiella cystitis   Bacteremia   Consultants: PCCM Palliative medicine Neurology  Procedures: Echocardiogram Replacement of trach  Antibiotics: Cefepime 6/12 through 6/14 Flagyl 6/12 through 6/14 Vancomycin 6/12 through 6/14 Cefepime x1 dose 6/18 Vancomycin x2 doses 6/18, 6/19 Vancomycin 8/26 through 9/1   Time spent: 45 minutes    Erin Hearing ANP  [facility] 7 am - 330 pm/M-F for direct patient care and secure chat Please refer to Amion for contact info 85  days

## 2020-12-26 DIAGNOSIS — R7881 Bacteremia: Secondary | ICD-10-CM | POA: Diagnosis not present

## 2020-12-26 DIAGNOSIS — A419 Sepsis, unspecified organism: Secondary | ICD-10-CM | POA: Diagnosis not present

## 2020-12-26 DIAGNOSIS — J398 Other specified diseases of upper respiratory tract: Secondary | ICD-10-CM | POA: Diagnosis not present

## 2020-12-26 DIAGNOSIS — J9621 Acute and chronic respiratory failure with hypoxia: Secondary | ICD-10-CM | POA: Diagnosis not present

## 2020-12-26 DIAGNOSIS — G931 Anoxic brain damage, not elsewhere classified: Secondary | ICD-10-CM | POA: Diagnosis not present

## 2020-12-26 LAB — CULTURE, BLOOD (ROUTINE X 2): Special Requests: ADEQUATE

## 2020-12-26 LAB — GLUCOSE, CAPILLARY
Glucose-Capillary: 110 mg/dL — ABNORMAL HIGH (ref 70–99)
Glucose-Capillary: 122 mg/dL — ABNORMAL HIGH (ref 70–99)
Glucose-Capillary: 123 mg/dL — ABNORMAL HIGH (ref 70–99)
Glucose-Capillary: 143 mg/dL — ABNORMAL HIGH (ref 70–99)
Glucose-Capillary: 145 mg/dL — ABNORMAL HIGH (ref 70–99)
Glucose-Capillary: 157 mg/dL — ABNORMAL HIGH (ref 70–99)
Glucose-Capillary: 83 mg/dL (ref 70–99)

## 2020-12-26 NOTE — Progress Notes (Signed)
Nutrition Follow-up  DOCUMENTATION CODES:   Not applicable  INTERVENTION:   Continue tube feeds via PEG: - Jevity 1.5 @ 45 ml/hr (1080 ml/day) - ProSource TF 45 ml BID - Free water flushes of 200 ml q 4 hours   Tube feeding regimen provides 1700 kcal, 91 grams of protein, and 821 ml of H2O.   Total free water with flushes: 2280 ml  NUTRITION DIAGNOSIS:   Increased nutrient needs related to wound healing as evidenced by estimated needs.  Ongoing, being addressed via TF  GOAL:   Patient will meet greater than or equal to 90% of their needs  Met via TF  MONITOR:   Vent status, Labs, Weight trends, TF tolerance, Skin, I & O's  REASON FOR ASSESSMENT:   Consult Enteral/tube feeding initiation and management  ASSESSMENT:   Sonia Drake is a 65 y.o. female with medical history significant of asthma/COPD, CHF, cardiac arrest with PEA/CPR with chronic respiratory failure on permanent trach with supplemental oxygen at 6L, type 2 DM, GERD, seizures and severe hypoxic-ischemic encephalopathy who presented to ER via EMS for respiratory distress from SNF.  Discussed pt with RN and during ICU rounds. Pt tolerating current TF without issue. Will continue with current TF regimen.  Current TF: Jevity 1.5 @ 45 ml/hr, ProSource TF 45 ml BID, free water flushes 200 ml q 4 hours  Admit weight: 88.4 kg Current weight: 92 kg  Patient remains on ventilator support via trach MV: 10.3 L/min Temp (24hrs), Avg:98.2 F (36.8 C), Min:97.5 F (36.4 C), Max:99 F (37.2 C)  Medications reviewed and include: pepcid, nutrisource fiber BID, SSI q 4 hours, semglee 20 units daily  Labs reviewed. CBG's: 83-143 x 24 hours  UOP: 2050 ml x 24 hours I/O's: +27.5 L since admit  Diet Order:   Diet Order             Diet NPO time specified  Diet effective now                   EDUCATION NEEDS:   No education needs have been identified at this time  Skin:  Skin Assessment: Skin  Integrity Issues: Other: MASD to buttocks  Last BM:  12/25/20 large type 6  Height:   Ht Readings from Last 1 Encounters:  12/25/20 _0  (1.575 m)    Weight:   Wt Readings from Last 1 Encounters:  12/26/20 92 kg    Ideal Body Weight:  45.5 kg  BMI:  Body mass index is 37.1 kg/m.  Estimated Nutritional Needs:   Kcal:  1600-1800  Protein:  90-105 grams  Fluid:  > 1.6 L    Gustavus Bryant, MS, RD, LDN Inpatient Clinical Dietitian Please see AMiON for contact information.

## 2020-12-26 NOTE — Progress Notes (Addendum)
[facility] PROGRESS NOTE  Sonia Drake XBM:[id number] DOB: 1955/08/13 DOA: 09/26/2020 PCP: Garwin Brothers, MD  Status: Remains inpatient appropriate because:Altered mental status, Unsafe d/c plan, and Inpatient level of care appropriate due to severity of illness  Dispo: The patient is from: SNF              Anticipated d/c is to: SNF Ventilator but due to the fact she has an adjustable trach and given her recurrent airway issues as described above have received no bed offers              Patient currently is not medically stable to d/c.   Difficult to place patient Yes   Level of care: ICU  CODE STATUS: Full Family Communication: Spoke with patient's daughter in detail on 8/17.  8/26 updated daughter on new diagnosis of MRSE bacteremia. DVT prophylaxis: Lovenox COVID vaccination status: Per H&P patient is not vaccinated   The following pertains to patient's living will dated June 22nd, 2016: 1) no sedative medications to keep her asleep.  She wants to be fully awake regardless of pain 2) she requested all means to keep her alive.  She wants to remain on life support until her daughter, Sonia Drake and the grandchildren decide otherwise after consulting with the medical team 3) she also hand wrote that she wishes to be allowed 56 to 90 days to see if she can "pull through"  HPI: 65 y/o female with medical history significant of asthma/COPD, CHF, cardiac arrest with PEA/CPR with chronic respiratory failure on permanent trach with supplemental oxygen at 6L, type 2 DM, GERD, seizures and severe hypoxic-ischemic encephalopathy who presented to ER via EMS for respiratory distress from SNF.  She presented with findings consistent with acute aspiration pneumonitis.  She decompensated after arrival and required transfer to ICU and initiation of mechanical ventilation.  She had sepsis physiology presumed secondary to [facility] bacteremia but it was later determined that the blood culture results were  related to contaminants and patient was no longer determined to be septic   On 6/16 a bronchoscopy revealed dynamic collapse of her trachea, tracheostomy was changed from 8.0 XLT cuffless to 6.0 XLT cuffed. She now is requiring chronic ventilatory support 2/2 advanced COPD/asthma and tracheobronchomalacia. PCCM physicians updated patient's daughter on status and poor prognosis along with recommendation for chronic vent.  Palliative medicine has had multiple discussions with the daughter.  Daughter continues to request aggressive care.  Unfortunately she continues to have multiple daily events regarding tracheal collapse with mucous plugging requiring excessive lavage and suction as well as bag-valve-mask for ventilation  Patient has had reemergence of continuous seizure activity.  Initially detected by noting patient with rhythmic blinking.  Continuous EEG revealed ongoing seizure activity.  On 8/23 during exam with sternal rub patient had what appeared to be decerebrate posturing, eyes were opened and noted with sluggish response to pupils.  Not long after eyes were opened for examination patient began having forceful, rhythmic spasms of the bilateral face that appeared consistent with seizure activity.  I returned to her room 1 hour later and her eyes were open and she was blinking constantly.  She had no blink reflex when tested.  She had another facial twitch that appeared consistent with seizure activity.  RN at bedside and witnessed these findings.  Significant Hospital events: 6/12 admitted from skilled nursing facility after aspiration event and copious secretions per tracheostomy 6/13 appears comfortable currently on 35% ATC. No distress. WBC ct trending down. No  events overnight growing GPC clusters in two of two cultures  6/16 increased work of breathing, upper airway noise, abdominal muscle use.  Bronchoscopy performed as trach exchange cuffless #8 Shiley XLT showed dynamic airway collapse,  tracheal collapse. 6/16 continued respiratory distress moved to ICU, changed to cuffed #6 Shiley XLT, placed on MV with some improved comfort 6/12 cefepime > 6/14, 6/18 6/12 vanc > 6/14, 6/18 6/23 mucus plugging, severe vent dyssynchrony 6/24-25 intermittent jerking, seizures on EEG 6/27 weaned off propofol gtt 6/28 briefly tolerated trach collar in AM 6/29 tolerated trach collar in AM; no further seizure like activity; working with CM/SW for vent-SNF 7/1 Weaning on CPAP/ PS 10/5, CM SW working on [employer], NO seizure activity, secretions are not an issue per nursing 7/11 trach changed to #8 Bivona with air cuff, much improved. 7/12 TCT 12 hours; rested on Vent overnight 7/13 on TCT 7/23 Trach collar tolerated 7/24 Full vent support 7/26>> Remains on full vent support this am, no issues with secretions per nursing 8/5 occluding trach with dynamic collapse: seen on bronchoscopy 8/6 called back emergently to bedside for tracheostomy occlusion: emergent bronch, bivona trach migrated back 2cm, improved with advancing to 2cm above carina 8/8 collapse caused hypoxia. Improved with repositioning  8/9 afternoon had another episode of tracheal collapse and occlusion of trach requiring BVM, sedation. Family produced paperwork which seems to have conflicting information about pts wishes  8/11: pt continues to have multiple daily episodes of tracheal collapse/mucous plugging req lavage, BVM. Repeated discussions with family re: code status esp in light of paperwork  8/15 consultation placed with ethics committee to evaluate regarding futility of care 8/17 spoke at length with patient's daughter regarding futility of care CODE STATUS.  Daughter continues to desire aggressive measures. 8/26 through 8/27 blood cultures positive for MDR staph epidermis/capitis and is currently on IV vancomycin 8/28 urine culture positive for VRE and Klebsiella with of which are considered colonization and not active  infection. 9/1 completed recommended course of IV vancomycin 9/2 increased frequency of IV Ativan in the past 24 hours-noted increased heart rate in BP-clear if Ativan usage secondary to vent dyssynchrony or other issues.  Subjective: Patient unresponsive except to painful stimulus applied to RLE.  Note precipitated blinking seizures when eyes open.  Eyes deviated to the right.   Objective: Vitals:   12/26/20 0725 12/26/20 0732  BP:    Pulse:  (!) 114  Resp:  (!) 24  Temp: 99 F (37.2 C)   SpO2:  99%    Intake/Output Summary (Last 24 hours) at 12/26/2020 0742 Last data filed at 12/26/2020 0600 Gross per 24 hour  Intake 1713.58 ml  Output 2050 ml  Net -336.42 ml   Filed Weights   12/22/20 0400 12/23/20 0500 12/26/20 0428  Weight: 93.8 kg 92.5 kg 92 kg    Exam:  Constitutional: Unresponsive except to extreme painful stimulus Respiratory: 8.0 cuffed Bivona air trach, ventilator settings: PRVC mode,VT 400, Rate 16, PEEP 5, PS 15, FiO2 30%; Lungs with inspiratory and expiratory wheezing.  Does have some respiratory effort over the set ventilatory rate.  Oximetry 94 to 99% Cardiovascular: SR/ST, intermittent tachycardia, stable peripheral edema, on midodrine Abdomen: Nondistended with normoactive bowel sounds. PEG tube for feedings. LBM 9/05 Skin: Stage II decubitus stable Neurologic: Remains unresponsive.  Decerebrate posturing to sternal rub.  Withdraws RLE to deep painful stimulus.  Pupils: Right 2 mm and left 4 mm with very sluggish reaction and with passive opening of eyes noted blinking  seizure activity and her eyes are deviated to right Psychiatric: Eyes closed.  Unresponsive    Assessment/Plan: Acute problems: Acute on chronic respiratory failure w/ hypoxemia Tracheostomy/vent dependence 2/2 Tracheobronchomalcia and advanced COPD/asthma Not a candidate to wean from ventilator/decannulate 2/2 unresponsive status as well as continued issues with tracheal collapse requiring  adjustable trach Patient continues to have issues with intermittent breath stacking/ventilator dyssynchrony as well as tracheal collapse.   Has not tolerated PS mode given above issues therefore no further attempts will be made to utilize this modality. 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  Discussed with palliative care team and given multiple interactions with patient's family at this juncture they feel they have nothing to offer and agree with ethics committee recommendations for pursuing futility of care.  Patient's family would like to continue aggressive care at least to the 90-day mark (85/90) Discussed with PCCM-they are aware that if family continues to push aggressive care after the 90-day mark that we will need to pursue the futility of care options/order set that includes 2 physicians reviewing the case and making recommendations in the chart  Seizure disorder Chronic condition-worsened since her anoxic brain injury Continue Keppra, clonazepam and Depakote Episodic issues related to status epilepticus LTM EEG obtained on 8/19-20 demonstrating continued seizure spikes -Per neurologist: "Of note, patient has been in persistent vegetative state and I do not believe that treating the seizures will lead to any improvement in patient's outcome.  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   Chronic diastolic dysfunction Echocardiogram this admission with grade 1 diastolic dysfunction, moderate LV hypertrophy hyperdynamic LV systolic function Not on pharmacotherapy secondary to suboptimal blood pressure readings Euvolemic   Chronic protein malnutrition secondary to sequelae from anoxic brain injury/PEG tube dependence Continue current tube feedings with Prosource Nutrition Problem: Increased nutrient needs Etiology: wound healing Signs/Symptoms: estimated needs Interventions: Tube feeding Body mass index is 37.1 kg/m.   Anemia of chronic disease Hemoglobin remained stable around 10  Stage II sacral decubitus Wound / Incision (Open or Dehisced) 10/31/20 (MASD) Moisture Associated Skin Damage Buttocks Bilateral (Active)  Date First Assessed/Time First Assessed: 10/31/20 2100   Wound Type: (MASD) Moisture Associated Skin Damage  Location: Buttocks  Location Orientation: Bilateral  Present on Admission: No    Assessments 10/31/2020  9:20 PM 12/25/2020 10:41 PM  Dressing Type None;Other (Comment) --  Site / Wound Assessment Pink --  Treatment Cleansed Cleansed     No Linked orders to display  Data Reviewed: Basic Metabolic Panel: Recent Labs  Lab 12/22/20 0227 12/24/20 0256  NA 139 138  K 3.9 4.5  CL 103 103  CO2 29 26  GLUCOSE 102* 105*  BUN 14 15  CREATININE 0.41*  0.37*  CALCIUM 10.3 10.5*  MG  --  1.9   Liver Function Tests: No results for input(s): AST, ALT, ALKPHOS, BILITOT, PROT, ALBUMIN in the last 168 hours.    No results for input(s): LIPASE, AMYLASE in the last 168 hours. No results for input(s): AMMONIA in the last 168 hours. CBC: Recent Labs  Lab 12/24/20 0256  WBC 10.8*  HGB 10.4*  HCT 34.6*  MCV 90.8  PLT 219   Cardiac Enzymes: No results for input(s): CKTOTAL, CKMB, CKMBINDEX, TROPONINI in the last 168 hours. BNP (last 3 results) Recent Labs    10/04/20 0051 10/05/20 0049 10/06/20 0548  BNP 145.8* 115.3* 125.4*    ProBNP (last 3 results) No results for input(s): PROBNP in the last 8760 hours.  CBG: Recent Labs  Lab 12/25/20 1522 12/25/20 1925 12/26/20 0053 12/26/20 0331 12/26/20 0724  GLUCAP 111* 135* 143* 83 122*    Recent Results (from the past 240 hour(s))  Culture, blood (routine x 2)     Status: None   Collection Time: 12/16/20  3:07 PM   Specimen: BLOOD LEFT HAND  Result Value Ref Range Status   Specimen Description BLOOD LEFT HAND  Final   Special Requests   Final    BOTTLES DRAWN AEROBIC ONLY Blood Culture results may not be optimal due to an inadequate volume of blood received in culture bottles   Culture   Final    NO GROWTH 5 DAYS Performed at [facility], [place] [address], [place], [place] [address]    Report Status 12/21/2020 FINAL  Final  Culture, blood (routine x 2)     Status: None   Collection Time: 12/16/20  3:16 PM   Specimen: BLOOD RIGHT HAND  Result Value Ref Range Status   Specimen Description BLOOD RIGHT HAND  Final   Special Requests   Final    BOTTLES DRAWN AEROBIC ONLY Blood Culture results may not be optimal due to an inadequate volume of blood received in culture bottles   Culture   Final    NO GROWTH 5 DAYS Performed at [facility], [place] [address], [place], [place] [address]    Report Status 12/21/2020 FINAL  Final  MRSA Next Gen by PCR, Nasal      Status: None   Collection Time: 12/18/20  1:16 PM   Specimen: Nasal Mucosa; Nasal Swab  Result Value Ref Range Status   MRSA by PCR Next Gen NOT DETECTED NOT DETECTED Final    Comment: (NOTE) The GeneXpert MRSA Assay (FDA approved for NASAL specimens only), is one component of a comprehensive MRSA colonization surveillance program. It is not intended to diagnose MRSA infection nor to guide or monitor treatment for MRSA infections. Test performance is not FDA approved in patients less than 54 years old. Performed at [facility], [place] [address], [place], [place] [address]       Studies: No results found.  Scheduled Meds:  arformoterol  15 mcg Nebulization BID   baclofen  5 mg Per Tube TID   budesonide  0.5 mg Nebulization BID   chlorhexidine gluconate (MEDLINE KIT)  15 mL Mouth Rinse BID   Chlorhexidine Gluconate Cloth  6 each Topical Q2000   clonazePAM  1  mg Per Tube BID   clopidogrel  75 mg Per Tube Daily   enoxaparin (LOVENOX) injection  0.5 mg/kg Subcutaneous Q24H   famotidine  20 mg Per Tube BID   feeding supplement (PROSource TF)  45 mL Per Tube BID   fiber  1 packet Per Tube BID   free water  200 mL Per Tube Q4H   Gerhardt's butt cream   Topical BID   guaiFENesin  5 mL Per Tube Q6H   hydrocortisone cream   Topical TID   insulin aspart  0-15 Units Subcutaneous Q4H   insulin glargine-yfgn  20 Units Subcutaneous QHS   levETIRAcetam  2,500 mg Per Tube BID   loratadine  10 mg Per Tube Daily   mouth rinse  15 mL Mouth Rinse 10 times per day   montelukast  10 mg Per Tube Daily   revefenacin  175 mcg Nebulization Daily   tiZANidine  2 mg Per Tube QHS   valproic acid  1,000 mg Per Tube Q12H   Continuous Infusions:  sodium chloride 10 mL/hr at 12/25/20 1800   feeding supplement (JEVITY 1.5 CAL/FIBER) 1,000 mL (12/26/20 0157)    Principal Problem:   Sepsis ([place]) Active Problems:   COPD (chronic obstructive pulmonary disease) (HCC)   Chronic diastolic CHF  (congestive heart failure) (HCC)   Acute on chronic respiratory failure with hypoxia (HCC)   GERD (gastroesophageal reflux disease)   Seizures ([place])   Type 2 diabetes mellitus without complication, with long-term current use of insulin (HCC)   Severe hypoxic-ischemic encephalopathy   Tracheostomy dependence ([place])   Pressure injury of skin   Difficult airway   Ventilator dependence ([place])   Acquired tracheal collapse   Anoxic brain injury ([place])   MRSA bacteremia   VRE infection (vancomycin resistant Enterococcus)   Klebsiella cystitis   Bacteremia   Consultants: PCCM Palliative medicine Neurology  Procedures: Echocardiogram Replacement of trach  Antibiotics: Cefepime 6/12 through 6/14 Flagyl 6/12 through 6/14 Vancomycin 6/12 through 6/14 Cefepime x1 dose 6/18 Vancomycin x2 doses 6/18, 6/19 Vancomycin 8/26 through 9/1   Time spent: 45 minutes    Erin Hearing ANP  [facility] 7 am - 330 pm/M-F for direct patient care and secure chat Please refer to Amion for contact info 86  days

## 2020-12-26 NOTE — Progress Notes (Signed)
RT note. Patient lavaged at this time. Small plugs removed, patient also receiving CPT at this time. Patient sat 100% and volumes 431. RT will continue to monitor.

## 2020-12-26 NOTE — Progress Notes (Signed)
eLink Physician-Brief Progress Note Patient Name: Sonia Drake DOB: 12-14-1955 MRN: [id number]   Date of Service  12/26/2020  HPI/Events of Note  Breath stacking overnight; small mucus plugs suctioned  eICU Interventions  On exam, not breath stacking all too often.  No new interventions performed     Intervention Category Intermediate Interventions: Respiratory distress - evaluation and management  Jacinta Shoe 12/26/2020, 6:10 AM

## 2020-12-26 NOTE — Progress Notes (Signed)
NAME:  Sonia Drake, MRN:  [id number], DOB:  1955-12-26, LOS: 86 ADMISSION DATE:  2020/10/17, CONSULTATION DATE:  6/15 REFERRING MD:  Sonia Drake, CHIEF COMPLAINT:  Dyspnea   History of Present Illness:  65 y/o female with anoxic brain injury, tracheostomy status who was admitted from a SNF on 6/12 with worsening respiratory failure.  She had previously been ventilator dependent but had been weaned from the trach.  On 6/16 a bronchoscopy revealed dynamic collapse of her trachea, tracheostomy was replaced.  Later required mechanical ventilation.  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 No distress. WBC ct trending down. No events overnight growing GPC clusters in two of two cultures  6/16 increased work of breathing, upper airway noise, abdominal muscle use.  Bronchoscopy performed as trach exchange cuffless #8 Shiley XLT showed dynamic airway collapse, tracheal collapse. 6/16 continued respiratory distress moved to ICU, changed to cuffed #6 Shiley XLT, placed on MV with some improved comfort 6/12 cefepime > 6/14, 6/18 6/12 vanc > 6/14, 6/18 6/23 mucus plugging, severe vent dyssynchrony 6/24-25 intermittent jerking, seizures on EEG 6/27 weaned off propofol gtt 6/28 briefly tolerated trach collar in AM 6/29 tolerated trach collar in AM; no further seizure like activity; working with CM/SW for vent-SNF 7/1 Weaning on CPAP/ PS 10/5, CM SW working on [employer], NO seizure activity, secretions are not an issue per  nursing 7/11 trach changed to #8 Bivona with air cuff, much improved. 7/12 TCT 12 hours; rested on Vent overnight 7/13 on TCT 7/23 Trach collar tolerated 7/24 Full vent support 7/26>> Remains on full vent support this am, no issues with secretions per nursing 8/5 occluding trach with dynamic collapse: seen on bronchoscopy 8/6 called back emergently to bedside for tracheostomy occlusion: emergent bronch, bivona trach migrated back 2cm, improved with advancing to 2cm above carina 8/8 collapse caused hypoxia. Improved with repositioning  8/9 afternoon had another episode of tracheal collapse and occlusion of trach requiring BVM, sedation. Family produced paperwork which seems to have conflicting information about pts wishes  8/11: pt continues to have multiple daily episodes of tracheal collapse/mucous plugging req lavage, BVM. 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 Neuro consulted 8/30 overnight , BP drop which responded to fluid bolus 9/2 Continues to have labile BP, now midodrine prn  Interim History / Subjective:  Stable on vent  Objective   Blood pressure (Abnormal) 145/97, pulse (Abnormal) 113, temperature 98.7 F (37.1 C), temperature source Axillary, resp. rate 19, height 5\' 2"  (1.575 m), weight 92 kg, SpO2 100 %.    Vent Mode: PRVC FiO2 (%):  [30 %] 30 % Set Rate:  [16 bmp] 16 bmp Vt Set:  [400 mL] 400 mL PEEP:  [5 cmH20] 5 cmH20 Plateau Pressure:  [15 cmH20-28 cmH20] 18 cmH20   Intake/Output Summary (Last 24 hours) at 12/26/2020 1211 Last data filed at 12/26/2020 0900 Gross per 24 hour  Intake 1479.59 ml  Output 2350 ml  Net -870.41 ml   Filed Weights   12/22/20 0400 12/23/20 0500 12/26/20 0428  Weight: 93.8 kg 92.5 kg 92 kg   Physical Exam: General this is a 65 year old female who remains comatose on vent HENT trach unremarkable Pulm decreased bases no accessory  use Card rrr Abd PEG + bowel sounds Neuro unresponsive does have cough Ext warm and dry Resolved Hospital Problem list     Assessment & Plan:   Anoxic encephalopathy / anoxic brain injury w/ PVS Acute on chronic respiratory failure w hypercarbia and hypoxia  Ventilator dependence  Severe tracheobronchomalacia Trach status Fewer episodes of desaturations with movement Minimal secretions per nursing Hx COPD   Discussion Sonia Drake is ventilator dependent due to her anoxic injury but also due to her dynamic airway collapse from severe tracheobronchomalacia. She will never come off the vent.   Plan Cont current full vent support Cont BDs, ICS and Singulair VAP bundle  All other interventions per IM service  Sonia Drake ACNP-BC [facility] Pulmonary/Critical Care Pager # [phone] OR # [phone] if no answer

## 2020-12-26 NOTE — Plan of Care (Signed)
  Problem: Health Behavior/Discharge Planning: Goal: Ability to manage health-related needs will improve Outcome: Not Progressing   Problem: Clinical Measurements: Goal: Ability to maintain clinical measurements within normal limits will improve Outcome: Not Progressing Goal: Will remain free from infection Outcome: Not Progressing Goal: Diagnostic test results will improve Outcome: Not Progressing Goal: Respiratory complications will improve Outcome: Not Progressing Goal: Cardiovascular complication will be avoided Outcome: Not Progressing   Problem: Activity: Goal: Risk for activity intolerance will decrease Outcome: Not Progressing   Problem: Elimination: Goal: Will not experience complications related to bowel motility Outcome: Not Progressing Goal: Will not experience complications related to urinary retention Outcome: Not Progressing   Problem: Pain Managment: Goal: General experience of comfort will improve Outcome: Not Progressing   Problem: Safety: Goal: Ability to remain free from injury will improve Outcome: Not Progressing   Problem: Skin Integrity: Goal: Risk for impaired skin integrity will decrease Outcome: Not Progressing   Problem: Activity: Goal: Ability to tolerate increased activity will improve Outcome: Not Progressing   Problem: Respiratory: Goal: Ability to maintain a clear airway and adequate ventilation will improve Outcome: Not Progressing   Problem: Activity: Goal: Ability to tolerate increased activity will improve Outcome: Not Progressing   Problem: Health Behavior/Discharge Planning: Goal: Ability to manage tracheostomy will improve Outcome: Not Progressing   Problem: Respiratory: Goal: Patent airway maintenance will improve Outcome: Not Progressing   

## 2020-12-27 DIAGNOSIS — G931 Anoxic brain damage, not elsewhere classified: Secondary | ICD-10-CM | POA: Diagnosis not present

## 2020-12-27 DIAGNOSIS — R569 Unspecified convulsions: Secondary | ICD-10-CM | POA: Diagnosis not present

## 2020-12-27 DIAGNOSIS — J9621 Acute and chronic respiratory failure with hypoxia: Secondary | ICD-10-CM | POA: Diagnosis not present

## 2020-12-27 DIAGNOSIS — J398 Other specified diseases of upper respiratory tract: Secondary | ICD-10-CM | POA: Diagnosis not present

## 2020-12-27 LAB — GLUCOSE, CAPILLARY
Glucose-Capillary: 107 mg/dL — ABNORMAL HIGH (ref 70–99)
Glucose-Capillary: 112 mg/dL — ABNORMAL HIGH (ref 70–99)
Glucose-Capillary: 137 mg/dL — ABNORMAL HIGH (ref 70–99)
Glucose-Capillary: 155 mg/dL — ABNORMAL HIGH (ref 70–99)
Glucose-Capillary: 160 mg/dL — ABNORMAL HIGH (ref 70–99)
Glucose-Capillary: 98 mg/dL (ref 70–99)

## 2020-12-27 NOTE — TOC Progression Note (Signed)
Transition of Care [facility]) - Progression Note    Patient Details  Name: Sonia Drake MRN: [id number] Date of Birth: May 29, 1955  [facility]) CM/SW Contact  Janae Bridgeman, RN Phone Number: 12/27/2020, 8:51 AM  Clinical Narrative:    Case management with no bed offers at this time since the patient has history of collapsible airway and adjustable tracheostomy / ventilatory support. Patient to remain hospitalized in ICU and continued discussion with family regarding futility of care per medical team.  Ethics committee following patient for care needs.   Expected Discharge Plan: Skilled Nursing Facility Barriers to Discharge: Continued Medical Work up, [employer] not available  Expected Discharge Plan and Services Expected Discharge Plan: Skilled Nursing Facility   Discharge Planning Services: CM Consult Post Acute Care Choice: Skilled Nursing Facility                                         Social Determinants of Health (SDOH) Interventions    Readmission Risk Interventions No flowsheet data found.

## 2020-12-27 NOTE — Progress Notes (Signed)
[facility] PROGRESS NOTE  AMOS GABER WUX:[id number] DOB: 02-11-1956 DOA: 10/07/2020 PCP: Garwin Brothers, MD  Status: Remains inpatient appropriate because:Altered mental status, Unsafe d/c plan, and Inpatient level of care appropriate due to severity of illness  Dispo: The patient is from: SNF              Anticipated d/c is to: SNF Ventilator but due to the fact she has an adjustable trach and given her recurrent airway issues as described above have received no bed offers              Patient currently is not medically stable to d/c.   Difficult to place patient Yes   Level of care: ICU  CODE STATUS: Full Family Communication: Spoke with patient's daughter in detail on 8/17.  8/26 updated daughter on new diagnosis of MRSE bacteremia. DVT prophylaxis: Lovenox COVID vaccination status: Per H&P patient is not vaccinated    HPI: 65 y/o female with medical history significant of asthma/COPD, CHF, cardiac arrest with PEA/CPR with chronic respiratory failure on permanent trach with supplemental oxygen at 6L, type 2 DM, GERD, seizures and severe hypoxic-ischemic encephalopathy who presented to ER via EMS for respiratory distress from SNF.  She presented with findings consistent with acute aspiration pneumonitis.  She decompensated after arrival and required transfer to ICU and initiation of mechanical ventilation.  She had sepsis physiology presumed secondary to [facility] bacteremia but it was later determined that the blood culture results were related to contaminants and patient was no longer determined to be septic   On 6/16 a bronchoscopy revealed dynamic collapse of her trachea, tracheostomy was changed from 8.0 XLT cuffless to 6.0 XLT cuffed. She now is requiring chronic ventilatory support 2/2 advanced COPD/asthma and tracheobronchomalacia. PCCM physicians updated patient's daughter on status and poor prognosis along with recommendation for chronic vent.  Palliative medicine has had  multiple discussions with the daughter.  Daughter continues to request aggressive care.  Unfortunately she continues to have multiple daily events regarding tracheal collapse with mucous plugging requiring excessive lavage and suction as well as bag-valve-mask for ventilation  Patient has had reemergence of continuous seizure activity as evidenced by rhythmic blinking.  Continuous EEG revealed ongoing seizure activity.  Family wishes to give patient a full 90 days from date of admission to see if there is any improvement.  This time frame was based on patient's advanced directive.  Significant Hospital events: 6/12 admitted from skilled nursing facility after aspiration event and copious secretions per tracheostomy 6/13 appears comfortable currently on 35% ATC. No distress. WBC ct trending down. No events overnight growing GPC clusters in two of two cultures  6/16 increased work of breathing, upper airway noise, abdominal muscle use.  Bronchoscopy performed as trach exchange cuffless #8 Shiley XLT showed dynamic airway collapse, tracheal collapse. 6/16 continued respiratory distress moved to ICU, changed to cuffed #6 Shiley XLT, placed on MV with some improved comfort 6/12 cefepime > 6/14, 6/18 6/12 vanc > 6/14, 6/18 6/23 mucus plugging, severe vent dyssynchrony 6/24-25 intermittent jerking, seizures on EEG 6/27 weaned off propofol gtt 6/28 briefly tolerated trach collar in AM 6/29 tolerated trach collar in AM; no further seizure like activity; working with CM/SW for vent-SNF 7/1 Weaning on CPAP/ PS 10/5, CM SW working on [employer], NO seizure activity, secretions are not an issue per nursing 7/11 trach changed to #8 Bivona with air cuff, much improved. 7/12 TCT 12 hours; rested on Vent overnight 7/13 on TCT 7/23 [facility]  collar tolerated 7/24 Full vent support 7/26>> Remains on full vent support this am, no issues with secretions per nursing 8/5 occluding trach with dynamic collapse: seen on  bronchoscopy 8/6 called back emergently to bedside for tracheostomy occlusion: emergent bronch, bivona trach migrated back 2cm, improved with advancing to 2cm above carina 8/8 collapse caused hypoxia. Improved with repositioning  8/9 afternoon had another episode of tracheal collapse and occlusion of trach requiring BVM, sedation. Family produced paperwork which seems to have conflicting information about pts wishes  8/11: pt continues to have multiple daily episodes of tracheal collapse/mucous plugging req lavage, BVM. Repeated discussions with family re: code status esp in light of paperwork  8/15 consultation placed with ethics committee to evaluate regarding futility of care 8/17 spoke at length with patient's daughter regarding futility of care CODE STATUS.  Daughter continues to desire aggressive measures. 8/26 through 8/27 blood cultures positive for MDR staph epidermis/capitis and is currently on IV vancomycin 8/28 urine culture positive for VRE and Klebsiella with of which are considered colonization and not active infection. 9/1 completed recommended course of IV vancomycin 9/2 increased frequency of IV Ativan in the past 24 hours-noted increased heart rate in BP-clear if Ativan usage secondary to vent dyssynchrony or other issues.  Subjective: Remains unresponsive except to painful stimulus.  Objective: Vitals:   12/27/20 0600 12/27/20 0700  BP: (!) 154/75 (!) 165/105  Pulse: 90 100  Resp: 17 (!) 21  Temp:    SpO2: 100% 99%    Intake/Output Summary (Last 24 hours) at 12/27/2020 0733 Last data filed at 12/27/2020 0500 Gross per 24 hour  Intake 2209.92 ml  Output 1650 ml  Net 559.92 ml   Filed Weights   12/23/20 0500 12/26/20 0428 12/27/20 0500  Weight: 92.5 kg 92 kg 92.3 kg    Exam:  Constitutional: Unresponsive except to extreme painful stimulus Respiratory: 8.0 cuffed Bivona air trach, ventilator settings: PRVC mode,VT 400, Rate 16, PEEP 5, PS 15, FiO2 30%; Lungs with  inspiratory and expiratory wheezing.  Does have some respiratory effort over the set ventilatory rate.  Oximetry 94 to 99% Cardiovascular: SR/ST, intermittent tachycardia, stable peripheral edema, on midodrine Abdomen: Nondistended with normoactive bowel sounds. PEG tube for feedings. LBM 9/05 Skin: Stage II decubitus stable Neurologic: Remains unresponsive.  Does not grimace with painful stimulus.  Decerebrate posturing to sternal rub.  Withdraws right side extremities to deep painful stimulus.  Pupils: Right 2 mm and left 4 mm with very sluggish reaction  Psychiatric: Eyes closed.  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  Seizure disorder Chronic condition-worsened since her anoxic brain injury LTM EEG obtained on 8/19-20 demonstrating continued seizure spikes-neurology evaluated.  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   Chronic diastolic dysfunction Not on pharmacotherapy secondary to suboptimal blood pressure readings   Chronic protein malnutrition secondary to sequelae from anoxic brain injury/PEG tube dependence Continue current tube feedings with Prosource  Anemia of chronic disease Hemoglobin remained stable around 10  Stage II sacral decubitus Continue current wound care    Data Reviewed: Basic Metabolic Panel: Recent Labs  Lab 12/22/20 0227 12/24/20 0256  NA 139 138  K 3.9 4.5  CL 103 103  CO2 29 26  GLUCOSE 102* 105*  BUN 14 15  CREATININE 0.41* 0.37*  CALCIUM 10.3 10.5*  MG  --  1.9    CBC: Recent Labs  Lab 12/24/20 0256  WBC 10.8*  HGB 10.4*  HCT 34.6*  MCV 90.8  PLT 219     CBG: Recent Labs  Lab 12/26/20 1121 12/26/20 1526 12/26/20 2054 12/26/20 2353 12/27/20 0502  GLUCAP 123* 110* 157* 145* 112*     Scheduled Meds:  arformoterol  15 mcg Nebulization BID   baclofen  5 mg Per Tube TID   budesonide  0.5 mg Nebulization BID   chlorhexidine gluconate (MEDLINE KIT)  15 mL Mouth Rinse BID   Chlorhexidine Gluconate Cloth  6 each Topical Q2000   clonazePAM  1 mg Per Tube BID   clopidogrel  75 mg Per Tube Daily   enoxaparin (LOVENOX) injection  0.5 mg/kg Subcutaneous Q24H   famotidine  20 mg Per Tube BID   feeding supplement (PROSource TF)  45 mL Per Tube BID   fiber  1 packet Per Tube BID   free water  200 mL Per Tube Q4H   Gerhardt's butt cream   Topical BID   guaiFENesin  5 mL Per Tube Q6H   hydrocortisone cream    Topical TID   insulin aspart  0-15 Units Subcutaneous Q4H   insulin glargine-yfgn  20 Units Subcutaneous QHS   levETIRAcetam  2,500 mg Per Tube BID   loratadine  10 mg Per Tube Daily   mouth rinse  15 mL Mouth Rinse 10 times per day   montelukast  10 mg Per Tube Daily   revefenacin  175 mcg Nebulization Daily   tiZANidine  2 mg Per Tube QHS   valproic acid  1,000 mg Per Tube Q12H   Continuous Infusions:  sodium chloride 10 mL/hr at 12/27/20 0500   feeding supplement (JEVITY 1.5 CAL/FIBER) 1,000 mL (12/26/20 0157)    Principal Problem:   Sepsis ([place]) Active Problems:   COPD (chronic obstructive pulmonary disease) (HCC)   Chronic diastolic CHF (congestive heart failure) (HCC)   Acute on chronic respiratory failure with hypoxia (HCC)   GERD (gastroesophageal reflux disease)   Seizures (HCC)   Type 2 diabetes mellitus without complication, with long-term current use of insulin (HCC)   Severe hypoxic-ischemic encephalopathy   Tracheostomy dependence (HCC)   Pressure injury of skin   Difficult airway   Ventilator dependence (HCC)   Acquired tracheal collapse   Anoxic brain injury ([place])  MRSA bacteremia   VRE infection (vancomycin resistant Enterococcus)   Klebsiella cystitis   Bacteremia   Consultants: PCCM Palliative medicine Neurology  Procedures: Echocardiogram Replacement of trach  Antibiotics: Cefepime 6/12 through 6/14 Flagyl 6/12 through 6/14 Vancomycin 6/12 through 6/14 Cefepime x1 dose 6/18 Vancomycin x2 doses 6/18, 6/19 Vancomycin 8/26 through 9/1   Time spent: 25 minutes    Erin Hearing ANP  [facility] 7 am - 330 pm/M-F for direct patient care and secure chat Please refer to [place] for contact info 87  days

## 2020-12-28 DIAGNOSIS — J398 Other specified diseases of upper respiratory tract: Secondary | ICD-10-CM | POA: Diagnosis not present

## 2020-12-28 DIAGNOSIS — G931 Anoxic brain damage, not elsewhere classified: Secondary | ICD-10-CM | POA: Diagnosis not present

## 2020-12-28 DIAGNOSIS — J9621 Acute and chronic respiratory failure with hypoxia: Secondary | ICD-10-CM | POA: Diagnosis not present

## 2020-12-28 DIAGNOSIS — R569 Unspecified convulsions: Secondary | ICD-10-CM | POA: Diagnosis not present

## 2020-12-28 LAB — GLUCOSE, CAPILLARY
Glucose-Capillary: 129 mg/dL — ABNORMAL HIGH (ref 70–99)
Glucose-Capillary: 132 mg/dL — ABNORMAL HIGH (ref 70–99)
Glucose-Capillary: 151 mg/dL — ABNORMAL HIGH (ref 70–99)
Glucose-Capillary: 156 mg/dL — ABNORMAL HIGH (ref 70–99)
Glucose-Capillary: 160 mg/dL — ABNORMAL HIGH (ref 70–99)
Glucose-Capillary: 85 mg/dL (ref 70–99)

## 2020-12-28 NOTE — Progress Notes (Signed)
[facility] PROGRESS NOTE  Sonia Drake WFU:[id number] DOB: 1956-01-05 DOA: 10/13/2020 PCP: Garwin Brothers, MD  Status: Remains inpatient appropriate because:Altered mental status, Unsafe d/c plan, and Inpatient level of care appropriate due to severity of illness  Dispo: The patient is from: SNF              Anticipated d/c is to: SNF Ventilator but due to the fact she has an adjustable trach and given her recurrent airway issues related to recurrent tracheal collapse and mucous plugging patient has not received any bed offers              Patient currently is not medically stable to d/c.   Difficult to place patient Yes   Level of care: ICU  CODE STATUS: Full Family Communication: Spoke with patient's daughter in detail on 8/17.  8/26 updated daughter on new diagnosis of MRSE bacteremia. DVT prophylaxis: Lovenox COVID vaccination status: Per H&P patient is not vaccinated    HPI: 65 y/o female with medical history significant of asthma/COPD, CHF, cardiac arrest with PEA/CPR with chronic respiratory failure on permanent trach with supplemental oxygen at 6L, type 2 DM, GERD, seizures and severe hypoxic-ischemic encephalopathy who presented to ER via EMS for respiratory distress from SNF.  She presented with findings consistent with acute aspiration pneumonitis.  She decompensated after arrival and required transfer to ICU and initiation of mechanical ventilation.  She had sepsis physiology presumed secondary to [facility] bacteremia but it was later determined that the blood culture results were related to contaminants and patient was no longer determined to be septic   On 6/16 a bronchoscopy revealed dynamic collapse of her trachea, tracheostomy was changed from 8.0 XLT cuffless to 6.0 XLT cuffed. She now is requiring chronic ventilatory support 2/2 advanced COPD/asthma and tracheobronchomalacia. PCCM physicians updated patient's daughter on status and poor prognosis along with recommendation  for chronic vent.  Palliative medicine has had multiple discussions with the daughter.  Daughter continues to request aggressive care.  Unfortunately she continues to have multiple daily events regarding tracheal collapse with mucous plugging requiring excessive lavage and suction as well as bag-valve-mask for ventilation  Patient has had reemergence of continuous seizure activity as evidenced by rhythmic blinking.  Continuous EEG revealed ongoing seizure activity.  Family wishes to give patient a full 90 days from date of admission to see if there is any improvement.  This time frame was based on patient's advanced directive.  Significant Hospital events: 6/12 admitted from skilled nursing facility after aspiration event and copious secretions per tracheostomy 6/13 appears comfortable currently on 35% ATC. No distress. WBC ct trending down. No events overnight growing GPC clusters in two of two cultures  6/16 increased work of breathing, upper airway noise, abdominal muscle use.  Bronchoscopy performed as trach exchange cuffless #8 Shiley XLT showed dynamic airway collapse, tracheal collapse. 6/16 continued respiratory distress moved to ICU, changed to cuffed #6 Shiley XLT, placed on MV with some improved comfort 6/12 cefepime > 6/14, 6/18 6/12 vanc > 6/14, 6/18 6/23 mucus plugging, severe vent dyssynchrony 6/24-25 intermittent jerking, seizures on EEG 6/27 weaned off propofol gtt 6/28 briefly tolerated trach collar in AM 6/29 tolerated trach collar in AM; no further seizure like activity; working with CM/SW for vent-SNF 7/1 Weaning on CPAP/ PS 10/5, CM SW working on [employer], NO seizure activity, secretions are not an issue per nursing 7/11 trach changed to #8 Bivona with air cuff, much improved. 7/12 TCT 12 hours; rested on  Vent overnight 7/13 on TCT 7/23 Trach collar tolerated 7/24 Full vent support 7/26>> Remains on full vent support this am, no issues with secretions per nursing 8/5  occluding trach with dynamic collapse: seen on bronchoscopy 8/6 called back emergently to bedside for tracheostomy occlusion: emergent bronch, bivona trach migrated back 2cm, improved with advancing to 2cm above carina 8/8 collapse caused hypoxia. Improved with repositioning  8/9 afternoon had another episode of tracheal collapse and occlusion of trach requiring BVM, sedation. Family produced paperwork which seems to have conflicting information about pts wishes  8/11: pt continues to have multiple daily episodes of tracheal collapse/mucous plugging req lavage, BVM. Repeated discussions with family re: code status esp in light of paperwork  8/15 consultation placed with ethics committee to evaluate regarding futility of care 8/17 spoke at length with patient's daughter regarding futility of care CODE STATUS.  Daughter continues to desire aggressive measures. 8/26 through 8/27 blood cultures positive for MDR staph epidermis/capitis and is currently on IV vancomycin 8/28 urine culture positive for VRE and Klebsiella with of which are considered colonization and not active infection. 9/1 completed recommended course of IV vancomycin 9/2 increased frequency of IV Ativan in the past 24 hours-noted increased heart rate in BP-clear if Ativan usage secondary to vent dyssynchrony or other issues.  Subjective: Remains unresponsive except to deep pain stimulus noting no grimacing to deep pain stimulus  Objective: Vitals:   12/28/20 0400 12/28/20 0500  BP: 113/75 (!) 134/118  Pulse: (!) 103 (!) 106  Resp: 20 (!) 25  Temp:    SpO2: 97% 100%    Intake/Output Summary (Last 24 hours) at 12/28/2020 0735 Last data filed at 12/28/2020 0500 Gross per 24 hour  Intake 814.98 ml  Output 1200 ml  Net -385.02 ml   Filed Weights   12/26/20 0428 12/27/20 0500 12/28/20 0457  Weight: 92 kg 92.3 kg 92.5 kg    Exam:  Constitutional: Unresponsive  Respiratory: 8.0 cuffed Bivona air trach, ventilator settings:  PRVC mode,VT 400, Rate 16, PEEP 5, PS 15, FiO2 30%; Lungs with inspiratory and expiratory wheezing.  Does have some respiratory effort over the set ventilatory rate.  Oximetry 94 to 99% Cardiovascular: SR/ST, intermittent tachycardia, stable peripheral edema, on midodrine Abdomen: Nondistended with normoactive bowel sounds. PEG tube for feedings. LBM 9/05 Skin: Stage II decubitus stable Neurologic: Remains unresponsive.  Does not grimace with painful stimulus.  Decerebrate posturing to sternal rub.  Withdraws right side extremities to deep painful stimulus.  Pupils: Right 2 mm and left 4 mm with very sluggish reaction  Psychiatric: Eyes closed.  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  Seizure disorder Chronic condition-worsened since her anoxic brain injury LTM EEG obtained on 8/19-20 demonstrating continued seizure spikes-neurology evaluated.  Unfortunately patient on max therapy with AEDs and  no additional treatment options available   History of cardiac arrest w/ resultant anoxic brain injury Cardiac catheterization revealed no evidence of CAD  MDR bacteremia secondary to staph epidermis/capitis/colonization of VRE and Klebsiella in urine Completed 7 days of IV vancomycin as recommended by ID  Intermittent hypotension secondary to dysautonomia from brain injury Continue midodrine 2.5 mg prn for SBP </= 85  History of CVA Continue Plavix   Hyponatremia Resolved   Diabetes mellitus 2 on long-term insulin with hyperglycemia Continue Lantus and SSI   Hypertension Blood pressure times has been suboptimal therefore beta-blocker discontinued.   Chronic diastolic dysfunction Not on pharmacotherapy secondary to suboptimal blood pressure readings   Chronic protein malnutrition secondary to sequelae from anoxic brain injury/PEG tube dependence Continue current tube feedings with Prosource  Anemia of chronic disease Hemoglobin remained stable around 10  Stage II sacral decubitus Continue current wound care    Data Reviewed: Basic Metabolic Panel: Recent Labs  Lab 12/22/20 0227 12/24/20 0256  NA 139 138  K 3.9 4.5  CL 103 103  CO2 29 26  GLUCOSE 102* 105*  BUN 14 15  CREATININE 0.41* 0.37*  CALCIUM 10.3 10.5*  MG  --  1.9    CBC: Recent Labs  Lab 12/24/20 0256  WBC 10.8*  HGB 10.4*  HCT 34.6*  MCV 90.8  PLT 219     CBG: Recent Labs  Lab 12/27/20 1225 12/27/20 1530 12/27/20 1938 12/27/20 2333 12/28/20 0339  GLUCAP 155* 107* 160* 98 151*     Scheduled Meds:  arformoterol  15 mcg Nebulization BID   baclofen  5 mg Per Tube TID   budesonide  0.5 mg Nebulization BID   chlorhexidine gluconate (MEDLINE KIT)  15 mL Mouth Rinse BID   Chlorhexidine Gluconate Cloth  6 each Topical Q2000   clonazePAM  1 mg Per Tube BID   clopidogrel  75 mg Per Tube Daily   enoxaparin (LOVENOX) injection  0.5 mg/kg Subcutaneous Q24H   famotidine  20 mg Per  Tube BID   feeding supplement (PROSource TF)  45 mL Per Tube BID   fiber  1 packet Per Tube BID   free water  200 mL Per Tube Q4H   Gerhardt's butt cream   Topical BID   guaiFENesin  5 mL Per Tube Q6H   hydrocortisone cream   Topical TID   insulin aspart  0-15 Units Subcutaneous Q4H   insulin glargine-yfgn  20 Units Subcutaneous QHS   levETIRAcetam  2,500 mg Per Tube BID   loratadine  10 mg Per Tube Daily   mouth rinse  15 mL Mouth Rinse 10 times per day   montelukast  10 mg Per Tube Daily   revefenacin  175 mcg Nebulization Daily   tiZANidine  2 mg Per Tube QHS   valproic acid  1,000 mg Per Tube Q12H   Continuous Infusions:  sodium chloride 10 mL/hr at 12/27/20 1200   feeding supplement (JEVITY 1.5 CAL/FIBER) 1,000 mL (12/26/20 0157)    Principal Problem:   Sepsis ([place]) Active Problems:   COPD (chronic obstructive pulmonary disease) (HCC)   Chronic diastolic CHF (congestive heart failure) (HCC)   Acute on chronic respiratory failure with hypoxia (HCC)   GERD (gastroesophageal reflux disease)   Seizures (HCC)   Type 2 diabetes mellitus without complication, with long-term  current use of insulin ([place])   Severe hypoxic-ischemic encephalopathy   Tracheostomy dependence ([place])   Pressure injury of skin   Difficult airway   Ventilator dependence ([place])   Acquired tracheal collapse   Anoxic brain injury ([place])   MRSA bacteremia   VRE infection (vancomycin resistant Enterococcus)   Klebsiella cystitis   Bacteremia   Consultants: PCCM Palliative medicine Neurology  Procedures: Echocardiogram Replacement of trach  Antibiotics: Cefepime 6/12 through 6/14 Flagyl 6/12 through 6/14 Vancomycin 6/12 through 6/14 Cefepime x1 dose 6/18 Vancomycin x2 doses 6/18, 6/19 Vancomycin 8/26 through 9/1   Time spent: 25 minutes    Erin Hearing ANP  [facility] 7 am - 330 pm/M-F for direct patient care and secure chat Please refer to [place] for contact info 88   days

## 2020-12-29 DIAGNOSIS — Z930 Tracheostomy status: Secondary | ICD-10-CM | POA: Diagnosis not present

## 2020-12-29 DIAGNOSIS — G931 Anoxic brain damage, not elsewhere classified: Secondary | ICD-10-CM | POA: Diagnosis not present

## 2020-12-29 DIAGNOSIS — J398 Other specified diseases of upper respiratory tract: Secondary | ICD-10-CM | POA: Diagnosis not present

## 2020-12-29 DIAGNOSIS — J9621 Acute and chronic respiratory failure with hypoxia: Secondary | ICD-10-CM | POA: Diagnosis not present

## 2020-12-29 LAB — GLUCOSE, CAPILLARY
Glucose-Capillary: 104 mg/dL — ABNORMAL HIGH (ref 70–99)
Glucose-Capillary: 113 mg/dL — ABNORMAL HIGH (ref 70–99)
Glucose-Capillary: 123 mg/dL — ABNORMAL HIGH (ref 70–99)
Glucose-Capillary: 125 mg/dL — ABNORMAL HIGH (ref 70–99)
Glucose-Capillary: 128 mg/dL — ABNORMAL HIGH (ref 70–99)
Glucose-Capillary: 135 mg/dL — ABNORMAL HIGH (ref 70–99)
Glucose-Capillary: 152 mg/dL — ABNORMAL HIGH (ref 70–99)

## 2020-12-29 MED ORDER — INSULIN GLARGINE-YFGN 100 UNIT/ML ~~LOC~~ SOLN
15.0000 [IU] | Freq: Every day | SUBCUTANEOUS | Status: DC
  Administered 2020-12-29: 15 [IU] via SUBCUTANEOUS
  Filled 2020-12-29 (×2): qty 0.15

## 2020-12-29 NOTE — Progress Notes (Addendum)
[facility] PROGRESS NOTE  Sonia Drake QBH:[id number] DOB: 1955/11/07 DOA: 10/18/2020 PCP: Garwin Brothers, MD  Status: Remains inpatient appropriate because:Altered mental status, Unsafe d/c plan, and Inpatient level of care appropriate due to severity of illness  Dispo: The patient is from: SNF              Anticipated d/c is to: SNF Ventilator but due to the fact she has an adjustable trach and given her recurrent airway issues related to recurrent tracheal collapse and mucous plugging patient has not received any bed offers              Patient currently is not medically stable to d/c.   Difficult to place patient Yes   Level of care: ICU  CODE STATUS: Full Family Communication: Spoke with patient's daughter in detail on 8/17.  8/26 updated daughter on new diagnosis of MRSE bacteremia. DVT prophylaxis: Lovenox COVID vaccination status: Per H&P patient is not vaccinated    HPI: 65 y/o female with medical history significant of asthma/COPD, CHF, cardiac arrest with PEA/CPR with chronic respiratory failure on permanent trach with supplemental oxygen at 6L, type 2 DM, GERD, seizures and severe hypoxic-ischemic encephalopathy who presented to ER via EMS for respiratory distress from SNF.  She presented with findings consistent with acute aspiration pneumonitis.  She decompensated after arrival and required transfer to ICU and initiation of mechanical ventilation.  She had sepsis physiology presumed secondary to [facility] bacteremia but it was later determined that the blood culture results were related to contaminants and patient was no longer determined to be septic   On 6/16 a bronchoscopy revealed dynamic collapse of her trachea, tracheostomy was changed from 8.0 XLT cuffless to 6.0 XLT cuffed. She now is requiring chronic ventilatory support 2/2 advanced COPD/asthma and tracheobronchomalacia. PCCM physicians updated patient's daughter on status and poor prognosis along with recommendation  for chronic vent.  Palliative medicine has had multiple discussions with the daughter.  Daughter continues to request aggressive care.  Unfortunately she continues to have multiple daily events regarding tracheal collapse with mucous plugging requiring excessive lavage and suction as well as bag-valve-mask for ventilation  Patient has had reemergence of continuous seizure activity as evidenced by rhythmic blinking.  Continuous EEG revealed ongoing seizure activity.  Family wishes to give patient a full 90 days from date of admission to see if there is any improvement.  This time frame was based on patient's advanced directive.  Significant Hospital events: 6/12 admitted from skilled nursing facility after aspiration event and copious secretions per tracheostomy 6/13 appears comfortable currently on 35% ATC. No distress. WBC ct trending down. No events overnight growing GPC clusters in two of two cultures  6/16 increased work of breathing, upper airway noise, abdominal muscle use.  Bronchoscopy performed as trach exchange cuffless #8 [place]iley XLT showed dynamic airway collapse, tracheal collapse. 6/16 continued respiratory distress moved to ICU, changed to cuffed #6 Shiley XLT, placed on MV with some improved comfort 6/12 cefepime > 6/14, 6/18 6/12 vanc > 6/14, 6/18 6/23 mucus plugging, severe vent dyssynchrony 6/24-25 intermittent jerking, seizures on EEG 6/27 weaned off propofol gtt 6/28 briefly tolerated trach collar in AM 6/29 tolerated trach collar in AM; no further seizure like activity; working with CM/SW for vent-SNF 7/1 Weaning on CPAP/ PS 10/5, CM SW working on [employer], NO seizure activity, secretions are not an issue per nursing 7/11 trach changed to #8 Bivona with air cuff, much improved. 7/12 TCT 12 hours; rested on  Vent overnight 7/13 on TCT 7/23 Trach collar tolerated 7/24 Full vent support 7/26>> Remains on full vent support this am, no issues with secretions per nursing 8/5  occluding trach with dynamic collapse: seen on bronchoscopy 8/6 called back emergently to bedside for tracheostomy occlusion: emergent bronch, bivona trach migrated back 2cm, improved with advancing to 2cm above carina 8/8 collapse caused hypoxia. Improved with repositioning  8/9 afternoon had another episode of tracheal collapse and occlusion of trach requiring BVM, sedation. Family produced paperwork which seems to have conflicting information about pts wishes  8/11: pt continues to have multiple daily episodes of tracheal collapse/mucous plugging req lavage, BVM. Repeated discussions with family re: code status esp in light of paperwork  8/15 consultation placed with ethics committee to evaluate regarding futility of care 8/17 spoke at length with patient's daughter regarding futility of care CODE STATUS.  Daughter continues to desire aggressive measures. 8/26 through 8/27 blood cultures positive for MDR staph epidermis/capitis and is currently on IV vancomycin 8/28 urine culture positive for VRE and Klebsiella with of which are considered colonization and not active infection. 9/1 completed recommended course of IV vancomycin 9/2 increased frequency of IV Ativan in the past 24 hours-noted increased heart rate in BP-clear if Ativan usage secondary to vent dyssynchrony or other issues.  Subjective: Responds to painful stimulus only by withdrawal of extremities or exhibiting focal seizure activity.  No grimacing.  Objective: Vitals:   12/29/20 0726 12/29/20 0730  BP:    Pulse:    Resp:    Temp:    SpO2: 100% 100%    Intake/Output Summary (Last 24 hours) at 12/29/2020 0734 Last data filed at 12/29/2020 0700 Gross per 24 hour  Intake 4749.81 ml  Output 1200 ml  Net 3549.81 ml   Filed Weights   12/27/20 0500 12/28/20 0457 12/29/20 0200  Weight: 92.3 kg 92.5 kg 90.8 kg    Exam:  Constitutional: Unresponsive  Respiratory: 8.0 cuffed Bivona air trach, ventilator settings: PRVC mode,VT  400, Rate 16, PEEP 5, PS 15, FiO2 30%; Lungs with inspiratory and expiratory wheezing.  Oximetry 94 to 99% Cardiovascular: SR/ST, intermittent tachycardia, stable peripheral edema, on midodrine Abdomen: Nondistended with normoactive bowel sounds. PEG tube for feedings. LBM 9/09 Skin: Stage II decubitus stable Neurologic: Remains unresponsive.  Does not grimace with painful stimulus.  Eyes deviated to the left.  Decerebrate posturing to sternal rub and application of painful stimulus.  Withdraws right side extremities to deep painful stimulus.  Pupils: Right 2 mm and left 4 mm with very sluggish reaction  Psychiatric: Eyes closed.  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 CPAP but she did not tolerate and was subsequently returned to [facility] mode  Known anoxic brain injury after cardiac arrest  Remains unresponsive except to deep painful stimulation Evaluated by neurology this admission with findings c/w PVS (vegetative state) 2/2 diffuse cerebral hypofunction without expectation of recovery to preinjury state Appreciate assistance of ethics team regarding futility of care concerns Patient's family would like to continue aggressive care at least to the 90-day mark (D #89/90) If family continues to desire aggressive care after the 90-day mark plan is to institute futility of care policy. **I  plan to speak to daughter on Monday at 9/12 to determine course of action desired by family**  Seizure disorder Chronic condition-worsened since her anoxic brain injury LTM EEG obtained on 8/19-20 demonstrating continued  seizure spikes-neurology evaluated.  Unfortunately patient on max therapy with AEDs and no additional treatment options available   History of cardiac arrest w/ resultant anoxic brain injury Cardiac catheterization revealed no evidence of CAD  MDR bacteremia secondary to staph epidermis/capitis/colonization of VRE and Klebsiella in urine Completed 7 days of IV vancomycin as recommended by ID  Intermittent hypotension secondary to dysautonomia from brain injury Continue midodrine 2.5 mg prn for SBP </= 85  History of CVA Continue Plavix   Hyponatremia Resolved   Diabetes mellitus 2 on long-term insulin with hyperglycemia Borderline hypoglycemia in the past 24 hours so we will decrease Semglee from 20 units to 15 units Continue SSI   Hypertension Blood pressure times has been suboptimal therefore beta-blocker discontinued.   Chronic diastolic dysfunction Not on pharmacotherapy secondary to suboptimal blood pressure readings   Chronic protein malnutrition secondary to sequelae from anoxic brain injury/PEG tube dependence Continue current tube feedings with Prosource  Anemia of chronic disease Hemoglobin remained stable around 10  Stage II sacral decubitus Continue current wound care    Data Reviewed: Basic Metabolic Panel: Recent Labs  Lab 12/24/20 0256  NA 138  K 4.5  CL 103  CO2 26  GLUCOSE 105*  BUN 15  CREATININE 0.37*  CALCIUM 10.5*  MG 1.9    CBC: Recent Labs  Lab 12/24/20 0256  WBC 10.8*  HGB 10.4*  HCT 34.6*  MCV 90.8  PLT 219     CBG: Recent Labs  Lab 12/28/20 1516 12/28/20 1941 12/28/20 2331 12/29/20 0053 12/29/20 0348  GLUCAP 132* 160* 85 128* 113*     Scheduled Meds:  arformoterol  15 mcg Nebulization BID   baclofen  5 mg Per Tube TID   budesonide  0.5 mg Nebulization BID   chlorhexidine gluconate (MEDLINE KIT)  15 mL Mouth Rinse BID   Chlorhexidine Gluconate Cloth  6 each Topical Q2000   clonazePAM  1 mg Per Tube BID    clopidogrel  75 mg Per Tube Daily   enoxaparin (LOVENOX) injection  0.5 mg/kg Subcutaneous Q24H   famotidine  20 mg Per Tube BID   feeding supplement (PROSource TF)  45 mL Per Tube BID   fiber  1 packet Per Tube BID   free water  200 mL Per Tube Q4H   Gerhardt's butt cream   Topical BID   guaiFENesin  5 mL Per Tube Q6H   hydrocortisone cream   Topical TID   insulin aspart  0-15 Units Subcutaneous Q4H   insulin glargine-yfgn  20 Units Subcutaneous QHS   levETIRAcetam  2,500 mg Per Tube BID   loratadine  10 mg Per Tube Daily   mouth rinse  15 mL Mouth Rinse 10 times per day   montelukast  10 mg Per Tube Daily   revefenacin  175 mcg Nebulization Daily   tiZANidine  2 mg Per Tube QHS   valproic acid  1,000 mg Per Tube Q12H   Continuous Infusions:  sodium chloride 10 mL/hr at 12/29/20 0700   feeding supplement (JEVITY 1.5 CAL/FIBER) 1,000 mL (12/26/20 0157)    Principal Problem:   Sepsis ([place]) Active Problems:   COPD (chronic obstructive pulmonary disease) (HCC)   Chronic diastolic CHF (congestive heart failure) (HCC)   Acute on chronic respiratory failure with hypoxia (  [place])   GERD (gastroesophageal reflux disease)   Seizures (HCC)   Type 2 diabetes mellitus without complication, with long-term current use of insulin (HCC)   Severe hypoxic-ischemic encephalopathy   Tracheostomy dependence ([place])   Pressure injury of skin   Difficult airway   Ventilator dependence ([place])   Acquired tracheal collapse   Anoxic brain injury ([place])   MRSA bacteremia   VRE infection (vancomycin resistant Enterococcus)   Klebsiella cystitis   Bacteremia   Consultants: PCCM Palliative medicine Neurology  Procedures: Echocardiogram Replacement of trach  Antibiotics: Cefepime 6/12 through 6/14 Flagyl 6/12 through 6/14 Vancomycin 6/12 through 6/14 Cefepime x1 dose 6/18 Vancomycin x2 doses 6/18, 6/19 Vancomycin 8/26 through 9/1   Time spent: 25 minutes    Erin Hearing ANP  [facility] 7 am - 330 pm/M-F for direct patient care and secure chat Please refer to [place] for contact info 89  days

## 2020-12-30 DIAGNOSIS — J9601 Acute respiratory failure with hypoxia: Secondary | ICD-10-CM | POA: Diagnosis not present

## 2020-12-30 DIAGNOSIS — J398 Other specified diseases of upper respiratory tract: Secondary | ICD-10-CM | POA: Diagnosis not present

## 2020-12-30 DIAGNOSIS — J9621 Acute and chronic respiratory failure with hypoxia: Secondary | ICD-10-CM | POA: Diagnosis not present

## 2020-12-30 DIAGNOSIS — A419 Sepsis, unspecified organism: Secondary | ICD-10-CM | POA: Diagnosis not present

## 2020-12-30 LAB — GLUCOSE, CAPILLARY
Glucose-Capillary: 83 mg/dL (ref 70–99)
Glucose-Capillary: 92 mg/dL (ref 70–99)
Glucose-Capillary: 113 mg/dL — ABNORMAL HIGH (ref 70–99)
Glucose-Capillary: 128 mg/dL — ABNORMAL HIGH (ref 70–99)
Glucose-Capillary: 160 mg/dL — ABNORMAL HIGH (ref 70–99)
Glucose-Capillary: 157 mg/dL — ABNORMAL HIGH (ref 70–99)

## 2020-12-30 MED ORDER — INSULIN GLARGINE-YFGN 100 UNIT/ML ~~LOC~~ SOLN
10.0000 [IU] | Freq: Every day | SUBCUTANEOUS | Status: DC
  Administered 2020-12-30 – 2021-01-26 (×28): 10 [IU] via SUBCUTANEOUS
  Filled 2020-12-30 (×30): qty 0.1

## 2020-12-30 NOTE — Progress Notes (Addendum)
PROGRESS NOTE  MEAGON DUSKIN MGQ:[id number] DOB: [birth year] DOA: 10/06/20 PCP: Eloisa Northern, MD   LOS: 90 days   Brief narrative: Patient is a 65 years old female with history of asthma/COPD, congestive heart failure, cardiac arrest with PEA, chronic respiratory failure on permanent trach at 6 L/min, type 2 diabetes mellitus, GERD, seizures and severe hypoxic ischemic encephalopathy presented to the hospital with respiratory distress from skilled nursing facility.  Patient was noted to have aspiration pneumonitis and was put on mechanical ventilation.  She also underwent bronchoscopy on 616 which showed collapse of her trachea so tracheostomy was subsequently changed.  Currently on ventilator support through tracheostomy.  Patient has poor overall prognosis due to seizure-like activity during hospitalization.   Family wishes to give patient a full 90 days from date of admission to see if there is any improvement.  This time frame was based on patient's advanced directive.  Assessment/Plan:  Principal Problem:   Sepsis (HCC) Active Problems:   COPD (chronic obstructive pulmonary disease) (HCC)   Chronic diastolic CHF (congestive heart failure) (HCC)   Acute on chronic respiratory failure with hypoxia (HCC)   GERD (gastroesophageal reflux disease)   Seizures (HCC)   Type 2 diabetes mellitus without complication, with long-term current use of insulin (HCC)   Severe hypoxic-ischemic encephalopathy   Tracheostomy dependence (HCC)   Pressure injury of skin   Difficult airway   Ventilator dependence (HCC)   Acquired tracheal collapse   Anoxic brain injury (HCC)   MRSA bacteremia   VRE infection (vancomycin resistant Enterococcus)   Klebsiella cystitis   Bacteremia   Acute on chronic respiratory failure w/ hypoxemia with Tracheostomy/vent dependence 2/2 Tracheobronchomalcia and advanced COPD/asthma Patient has been considered not to be a good candidate for decannulation / wean from  ventilator due to decreased responsiveness and tracheal collapse requiring adjustable trach.  CPAP was tried on 01/07/2021 but she could not tolerate so patient has been transitioned back to [facility] mode.   Known anoxic brain injury after cardiac arrest  Response mildly to deep painful stimulus.  Seen by neurology on this admission with presented to the state.  Ethics team on board.  Family continuing aggressive care at least for 90 days.  2 speak with the patient's family on 9/12 about further goals of care   Seizure disorder On maximal therapy with AEDs.  No other additional treatment available.     History of cardiac arrest w/ resultant anoxic brain injury Cardiac catheterization revealed no evidence of CAD.  Continue supportive care.   MDR bacteremia secondary to staph epidermis/capitis/colonization of VRE and Klebsiella in urine Completed 7 days of IV vancomycin as recommended by ID.  Not currently on any antibiotics.   Intermittent hypotension secondary to dysautonomia from brain injury, history of hypertension. Midodrine.  Currently blood pressure is stable.  Beta-blocker has been discontinued at this time.   History of CVA Continue Plavix   Hyponatremia Resolved.  No recent labs.   Diabetes mellitus 2 on long-term insulin with hyperglycemia Due to borderline hypoglycemia, Semglee has been changed to 10 units. Continue sliding scale insulin, Accu-Cheks, diabetic diet.    Chronic diastolic dysfunction Not on pharmacotherapy due to suboptimal blood pressure.   Chronic protein malnutrition secondary to sequelae from anoxic brain injury/PEG tube dependence Continue tube feedings with Prosource.   Anemia of chronic disease Hemoglobin remained stable around 10   Stage II sacral decubitus Continue current wound care   DVT prophylaxis:   Lovenox subcu   Code Status:  Full code  Family Communication: None  Status is: Inpatient  Remains inpatient appropriate because:IV  treatments appropriate due to intensity of illness or inability to take PO and Inpatient level of care appropriate due to severity of illness  Dispo: The patient is from: SNF              Anticipated d/c is to: SNF              Patient currently is not medically stable to d/c.   Difficult to place patient Yes  Consultants: PCCM Palliative medicine Neurology    Procedures: 2D echocardiogram Tracheostomy with ventilation  Anti-infectives:  None at present  Anti-infectives (From admission, onward)    Start     Dose/Rate Route Frequency Ordered Stop   12/19/20 2200  vancomycin (VANCOREADY) IVPB 750 mg/150 mL        750 mg 150 mL/hr over 60 Minutes Intravenous Every 12 hours 12/19/20 1026 12/21/20 2344   12/16/20 1000  vancomycin (VANCOCIN) IVPB 1000 mg/200 mL premix  Status:  Discontinued        1,000 mg 200 mL/hr over 60 Minutes Intravenous Every 24 hours 12/15/20 1017 12/19/20 1026   12/15/20 1200  vancomycin (VANCOREADY) IVPB 1500 mg/300 mL        1,500 mg 150 mL/hr over 120 Minutes Intravenous  Once 12/15/20 1045 12/15/20 1407   12/15/20 1115  vancomycin (VANCOREADY) IVPB 1500 mg/300 mL  Status:  Discontinued        1,500 mg 150 mL/hr over 120 Minutes Intravenous  Once 12/15/20 1017 12/15/20 1045   10/07/20 1400  vancomycin (VANCOREADY) IVPB 1500 mg/300 mL  Status:  Discontinued        1,500 mg 150 mL/hr over 120 Minutes Intravenous Every 24 hours 10/07/20 1223 10/09/20 0829   10/07/20 1200  ceFEPIme (MAXIPIME) 2 g in sodium chloride 0.9 % 100 mL IVPB  Status:  Discontinued        2 g 200 mL/hr over 30 Minutes Intravenous Every 8 hours 10/07/20 0828 10/07/20 1221   10/02/20 1400  vancomycin (VANCOREADY) IVPB 1250 mg/250 mL  Status:  Discontinued        1,250 mg 166.7 mL/hr over 90 Minutes Intravenous Every 24 hours 10/05/2020 1313 10/04/20 1221   09/29/2020 2200  ceFEPIme (MAXIPIME) 2 g in sodium chloride 0.9 % 100 mL IVPB  Status:  Discontinued        2 g 200 mL/hr over 30  Minutes Intravenous Every 8 hours 09/20/2020 1313 10/04/20 1221   10/04/2020 2200  metroNIDAZOLE (FLAGYL) IVPB 500 mg  Status:  Discontinued        500 mg 100 mL/hr over 60 Minutes Intravenous Every 8 hours 10/11/2020 1616 10/04/20 1221   09/22/2020 1330  vancomycin (VANCOREADY) IVPB 2000 mg/400 mL        2,000 mg 200 mL/hr over 120 Minutes Intravenous  Once 09/24/2020 1257 09/28/2020 1550   10/08/2020 1300  ceFEPIme (MAXIPIME) 2 g in sodium chloride 0.9 % 100 mL IVPB        2 g 200 mL/hr over 30 Minutes Intravenous  Once 10/09/2020 1257 10/16/2020 1330   09/30/2020 1300  metroNIDAZOLE (FLAGYL) IVPB 500 mg        500 mg 100 mL/hr over 60 Minutes Intravenous  Once 10/02/2020 1257 09/30/2020 1443        Subjective: Today, patient was seen and examined at bedside.   Objective: Vitals:   12/30/20 0800 12/30/20 0900  BP: (!) 146/103 135/89  Pulse: (!) 104 (!) 105  Resp: (!) 21 16  Temp:    SpO2: 99% 99%    Intake/Output Summary (Last 24 hours) at 12/30/2020 0954 Last data filed at 12/30/2020 0900 Gross per 24 hour  Intake 3392.35 ml  Output 2425 ml  Net 967.35 ml   Filed Weights   12/28/20 0457 12/29/20 0200 12/30/20 0351  Weight: 92.5 kg 90.8 kg 91.8 kg   Body mass index is 37.02 kg/m.   Physical Exam: GENERAL: Patient awake but not responsive,  HENT: No scleral pallor or icterus. Pupils equally reactive to light. Oral mucosa is moist, tracheostomy in place. NECK: is supple, no gross swelling noted. CHEST: Clear to auscultation. No crackles or wheezes.  Diminished breath sounds bilaterally. CVS: S1 and S2 heard, no murmur. Regular rate and rhythm.  ABDOMEN: Soft, non-tender, bowel sounds are present. EXTREMITIES: Bilateral lower extremity edema CNS: Grimaces with painful stimulus.  Postures sternal rub.  Withdraws to painful stimulus. SKIN: warm and dry without rashes..  Data Review: I have personally reviewed the following laboratory data and studies,  CBC: Recent Labs  Lab  01/17/21 0256  WBC 10.8*  HGB 10.4*  HCT 34.6*  MCV 90.8  PLT 219   Basic Metabolic Panel: Recent Labs  Lab 2021-01-17 0256  NA 138  K 4.5  CL 103  CO2 26  GLUCOSE 105*  BUN 15  CREATININE 0.37*  CALCIUM 10.5*  MG 1.9   Liver Function Tests: No results for input(s): AST, ALT, ALKPHOS, BILITOT, PROT, ALBUMIN in the last 168 hours. No results for input(s): LIPASE, AMYLASE in the last 168 hours. No results for input(s): AMMONIA in the last 168 hours. Cardiac Enzymes: No results for input(s): CKTOTAL, CKMB, CKMBINDEX, TROPONINI in the last 168 hours. BNP (last 3 results) Recent Labs    10/04/20 0051 10/05/20 0049 10/06/20 0548  BNP 145.8* 115.3* 125.4*    ProBNP (last 3 results) No results for input(s): PROBNP in the last 8760 hours.  CBG: Recent Labs  Lab 12/29/20 1525 12/29/20 1937 12/29/20 2353 12/30/20 0426 12/30/20 0749  GLUCAP 125* 123* 152* 83 92   No results found for this or any previous visit (from the past 240 hour(s)).   Studies: No results found.    Joycelyn Das, MD  [facility] 12/30/2020  If 7PM-7AM, please contact night-coverage

## 2020-12-30 NOTE — Plan of Care (Signed)
  Problem: Clinical Measurements: Goal: Ability to maintain clinical measurements within normal limits will improve Outcome: Not Progressing Goal: Will remain free from infection Outcome: Not Progressing Goal: Diagnostic test results will improve Outcome: Not Progressing   

## 2020-12-31 DIAGNOSIS — G931 Anoxic brain damage, not elsewhere classified: Secondary | ICD-10-CM | POA: Diagnosis not present

## 2020-12-31 DIAGNOSIS — J398 Other specified diseases of upper respiratory tract: Secondary | ICD-10-CM | POA: Diagnosis not present

## 2020-12-31 DIAGNOSIS — A419 Sepsis, unspecified organism: Secondary | ICD-10-CM | POA: Diagnosis not present

## 2020-12-31 DIAGNOSIS — J9621 Acute and chronic respiratory failure with hypoxia: Secondary | ICD-10-CM | POA: Diagnosis not present

## 2020-12-31 LAB — GLUCOSE, CAPILLARY
Glucose-Capillary: 123 mg/dL — ABNORMAL HIGH (ref 70–99)
Glucose-Capillary: 127 mg/dL — ABNORMAL HIGH (ref 70–99)
Glucose-Capillary: 132 mg/dL — ABNORMAL HIGH (ref 70–99)
Glucose-Capillary: 133 mg/dL — ABNORMAL HIGH (ref 70–99)
Glucose-Capillary: 146 mg/dL — ABNORMAL HIGH (ref 70–99)
Glucose-Capillary: 149 mg/dL — ABNORMAL HIGH (ref 70–99)
Glucose-Capillary: 152 mg/dL — ABNORMAL HIGH (ref 70–99)

## 2020-12-31 NOTE — Progress Notes (Signed)
PROGRESS NOTE  Sonia Drake YBW:[id number] DOB: 12/17/1955 DOA: 10-31-2020 PCP: Eloisa Northern, MD   LOS: 91 days   Brief narrative: Patient is a 65 years old female with history of asthma/COPD, congestive heart failure, cardiac arrest with PEA, chronic respiratory failure on permanent trach at 6 L/min, type 2 diabetes mellitus, GERD, seizures and severe hypoxic ischemic encephalopathy presented to the hospital with respiratory distress from skilled nursing facility.  Patient was noted to have aspiration pneumonitis and was put on mechanical ventilation.  She also underwent bronchoscopy on 616 which showed collapse of her trachea so tracheostomy was subsequently changed.  Currently on ventilator support through tracheostomy.  Patient has poor overall prognosis due to seizure-like activity during hospitalization.   Family wishes to give patient a full 90 days from date of admission to see if there is any improvement.  This time frame was based on patient's advanced directive.  Assessment/Plan:  Principal Problem:   Sepsis (HCC) Active Problems:   COPD (chronic obstructive pulmonary disease) (HCC)   Chronic diastolic CHF (congestive heart failure) (HCC)   Acute on chronic respiratory failure with hypoxia (HCC)   GERD (gastroesophageal reflux disease)   Seizures (HCC)   Type 2 diabetes mellitus without complication, with long-term current use of insulin (HCC)   Severe hypoxic-ischemic encephalopathy   Tracheostomy dependence (HCC)   Pressure injury of skin   Difficult airway   Ventilator dependence (HCC)   Acquired tracheal collapse   Anoxic brain injury (HCC)   MRSA bacteremia   VRE infection (vancomycin resistant Enterococcus)   Klebsiella cystitis   Bacteremia  Acute on chronic respiratory failure w/ hypoxemia with Tracheostomy/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  CPAP was tried on 01/07/2021 but she could not tolerate so patient has been transitioned back to [facility] mode.   Known anoxic brain injury after cardiac arrest  Response mildly to deep painful stimulus.  Seen by neurology on this admission.  Ethics team on board.  Family continuing aggressive care at least for 90 days.  Team to speak with the patient's family on 9/12 about further goals of care   Seizure disorder On maximal therapy with AEDs.  No other additional treatment available.     History of cardiac arrest w/ resultant anoxic brain injury Cardiac catheterization revealed no evidence of CAD.  Continue supportive care.   MDR bacteremia secondary to staph epidermis/capitis/colonization of VRE and Klebsiella in urine Completed 7 days of IV vancomycin as recommended by ID.  Not currently on any antibiotics.   Intermittent hypotension secondary to dysautonomia from brain injury, history of hypertension. Currently on midodrine.    Beta-blocker has been discontinued at this time.   History of CVA Continue Plavix   Hyponatremia Resolved.  No recent labs.   Diabetes mellitus 2 on long-term insulin with hyperglycemia Due to borderline hypoglycemia, Semglee has been changed to 10 units. Continue sliding scale insulin, Accu-Cheks, diabetic diet.  Latest POC glucose of 152    Chronic diastolic dysfunction Not on pharmacotherapy due to suboptimal blood pressure.   Chronic protein malnutrition secondary to sequelae from anoxic brain injury/PEG tube dependence Continue tube feedings with Prosource.   Anemia of chronic disease Hemoglobin remained stable around 10   Stage II sacral decubitus Continue current wound care   DVT prophylaxis:   Lovenox subcu  Code Status: Full code  Family Communication: None  Status is: Inpatient  Remains inpatient appropriate because:IV treatments appropriate due  to intensity of illness or inability to take PO and Inpatient level of care appropriate due to severity of illness  Dispo: The patient is from: SNF              Anticipated d/c is to: SNF              Patient currently is not medically stable to d/c.   Difficult to place patient Yes  Consultants: PCCM Palliative medicine Neurology  Procedures: 2D echocardiogram Tracheostomy with ventilation  Anti-infectives:  None at present  Subjective: Today, patient was seen and examined at bedside. No interval complaints reported  Objective: Vitals:   12/31/20 0853 12/31/20 0900  BP:  (!) 158/95  Pulse:  (!) 107  Resp:  20  Temp:    SpO2: 99% 100%    Intake/Output Summary (Last 24 hours) at 12/31/2020 0913 Last data filed at 12/31/2020 0400 Gross per 24 hour  Intake 1564.87 ml  Output 1950 ml  Net -385.13 ml    Filed Weights   12/29/20 0200 12/30/20 0351 12/31/20 0500  Weight: 90.8 kg 91.8 kg 90.2 kg   Body mass index is 36.37 kg/m.   Physical Exam: GENERAL: Patient awake but not responsive,  HENT: No scleral pallor or icterus. Pupils equally reactive to light. Oral mucosa is moist, tracheostomy in place. NECK: is supple, no gross swelling noted. CHEST: Clear to auscultation. No crackles or wheezes.  Diminished breath sounds bilaterally. CVS: S1 and S2 heard, no murmur. Regular rate and rhythm.  ABDOMEN: Soft, non-tender, bowel sounds are present. EXTREMITIES: Bilateral lower extremity edema CNS: Grimaces with painful stimulus.  Postures sternal rub.  Withdraws to painful stimulus. SKIN: warm and dry without rashes.  Data Review: I have personally reviewed the following laboratory data and studies,  CBC: No results for input(s): WBC, NEUTROABS, HGB, HCT, MCV, PLT in the last 168 hours.  Basic Metabolic Panel: No results for input(s): NA, K, CL, CO2, GLUCOSE, BUN, CREATININE, CALCIUM, MG, PHOS in the last 168 hours.  Liver Function Tests: No results for input(s):  AST, ALT, ALKPHOS, BILITOT, PROT, ALBUMIN in the last 168 hours. No results for input(s): LIPASE, AMYLASE in the last 168 hours. No results for input(s): AMMONIA in the last 168 hours. Cardiac Enzymes: No results for input(s): CKTOTAL, CKMB, CKMBINDEX, TROPONINI in the last 168 hours. BNP (last 3 results) Recent Labs    10/04/20 0051 10/05/20 0049 10/06/20 0548  BNP 145.8* 115.3* 125.4*     ProBNP (last 3 results) No results for input(s): PROBNP in the last 8760 hours.  CBG: Recent Labs  Lab 12/30/20 1953 12/30/20 2134 12/31/20 0023 12/31/20 0357 12/31/20 0758  GLUCAP 160* 157* 133* 132* 152*    No results found for this or any previous visit (from the past 240 hour(s)).   Studies: No results found.    Joycelyn Das, MD  [facility] 12/31/2020  If 7PM-7AM, please contact night-coverage

## 2021-01-01 DIAGNOSIS — R569 Unspecified convulsions: Secondary | ICD-10-CM | POA: Diagnosis not present

## 2021-01-01 DIAGNOSIS — J398 Other specified diseases of upper respiratory tract: Secondary | ICD-10-CM | POA: Diagnosis not present

## 2021-01-01 DIAGNOSIS — J9621 Acute and chronic respiratory failure with hypoxia: Secondary | ICD-10-CM | POA: Diagnosis not present

## 2021-01-01 DIAGNOSIS — A419 Sepsis, unspecified organism: Secondary | ICD-10-CM | POA: Diagnosis not present

## 2021-01-01 DIAGNOSIS — G931 Anoxic brain damage, not elsewhere classified: Secondary | ICD-10-CM | POA: Diagnosis not present

## 2021-01-01 LAB — GLUCOSE, CAPILLARY
Glucose-Capillary: 121 mg/dL — ABNORMAL HIGH (ref 70–99)
Glucose-Capillary: 130 mg/dL — ABNORMAL HIGH (ref 70–99)
Glucose-Capillary: 140 mg/dL — ABNORMAL HIGH (ref 70–99)
Glucose-Capillary: 163 mg/dL — ABNORMAL HIGH (ref 70–99)
Glucose-Capillary: 137 mg/dL — ABNORMAL HIGH (ref 70–99)

## 2021-01-01 MED ORDER — LACTATED RINGERS IV BOLUS
1000.0000 mL | Freq: Once | INTRAVENOUS | Status: AC
  Administered 2021-01-01: 1000 mL via INTRAVENOUS

## 2021-01-01 NOTE — Progress Notes (Addendum)
[facility] PROGRESS NOTE  Sonia Drake SJG:[id number] DOB: 1955-08-29 DOA: 09/21/2020 PCP: Garwin Brothers, MD  Status: Remains inpatient appropriate because:Altered mental status, Unsafe d/c plan, and Inpatient level of care appropriate due to severity of illness  Dispo: The patient is from: SNF              Anticipated d/c is to: SNF Ventilator but due to the fact she has an adjustable trach and given her recurrent airway issues related to recurrent tracheal collapse and mucous plugging patient has not received any bed offers              Patient currently is not medically stable to d/c.   Difficult to place patient Yes   Level of care: ICU  CODE STATUS: Full Family Communication: Spoke with patient's daughter in detail on 8/17.  9/12 extensive conversation held with both patient's daughter and patient's cousin regarding DNR and end-of-life care.  Neither party was ready to accept this as fact.  Please see detailed note below DVT prophylaxis: Lovenox COVID vaccination status: Per H&P patient is not vaccinated    HPI: 65 y/o female with medical history significant of asthma/COPD, CHF, cardiac arrest with PEA/CPR with chronic respiratory failure on permanent trach with supplemental oxygen at 6L, type 2 DM, GERD, seizures and severe hypoxic-ischemic encephalopathy who presented to ER via EMS for respiratory distress from SNF.  She presented with findings consistent with acute aspiration pneumonitis.  She decompensated after arrival and required transfer to ICU and initiation of mechanical ventilation.  She had sepsis physiology presumed secondary to [facility] bacteremia but it was later determined that the blood culture results were related to contaminants and patient was no longer determined to be septic   On 6/16 a bronchoscopy revealed dynamic collapse of her trachea, tracheostomy was changed from 8.0 XLT cuffless to 6.0 XLT cuffed. She now is requiring chronic ventilatory support 2/2  advanced COPD/asthma and tracheobronchomalacia. PCCM physicians updated patient's daughter on status and poor prognosis along with recommendation for chronic vent.  Palliative medicine has had multiple discussions with the daughter.  Daughter continues to request aggressive care.  Unfortunately she continues to have multiple daily events regarding tracheal collapse with mucous plugging requiring excessive lavage and suction as well as bag-valve-mask for ventilation  Patient has had reemergence of continuous seizure activity as evidenced by rhythmic blinking.  Continuous EEG revealed ongoing seizure activity.  Family wishes to give patient a full 90 days from date of admission to see if there is any improvement.  This time frame was based on patient's advanced directive.  Significant Hospital events: 6/12 admitted from skilled nursing facility after aspiration event and copious secretions per tracheostomy 6/13 appears comfortable currently on 35% ATC. No distress. WBC ct trending down. No events overnight growing GPC clusters in two of two cultures  6/16 increased work of breathing, upper airway noise, abdominal muscle use.  Bronchoscopy performed as trach exchange cuffless #8 Shiley XLT showed dynamic airway collapse, tracheal collapse. 6/16 continued respiratory distress moved to ICU, changed to cuffed #6 Shiley XLT, placed on MV with some improved comfort 6/12 cefepime > 6/14, 6/18 6/12 vanc > 6/14, 6/18 6/23 mucus plugging, severe vent dyssynchrony 6/24-25 intermittent jerking, seizures on EEG 6/27 weaned off propofol gtt 6/28 briefly tolerated trach collar in AM 6/29 tolerated trach collar in AM; no further seizure like activity; working with CM/SW for vent-SNF 7/1 Weaning on CPAP/ PS 10/5, CM SW working on [employer], NO seizure activity, secretions  are not an issue per nursing 7/11 trach changed to #8 Bivona with air cuff, much improved. 7/12 TCT 12 hours; rested on Vent overnight 7/13 on  TCT 7/23 Trach collar tolerated 7/24 Full vent support 7/26>> Remains on full vent support this am, no issues with secretions per nursing 8/5 occluding trach with dynamic collapse: seen on bronchoscopy 8/6 called back emergently to bedside for tracheostomy occlusion: emergent bronch, bivona trach migrated back 2cm, improved with advancing to 2cm above carina 8/8 collapse caused hypoxia. Improved with repositioning  8/9 afternoon had another episode of tracheal collapse and occlusion of trach requiring BVM, sedation. Family produced paperwork which seems to have conflicting information about pts wishes  8/11: pt continues to have multiple daily episodes of tracheal collapse/mucous plugging req lavage, BVM. Repeated discussions with family re: code status esp in light of paperwork  8/15 consultation placed with ethics committee to evaluate regarding futility of care 8/17 spoke at length with patient's daughter regarding futility of care CODE STATUS.  Daughter continues to desire aggressive measures. 8/26 through 8/27 blood cultures positive for MDR staph epidermis/capitis and is currently on IV vancomycin 8/28 urine culture positive for VRE and Klebsiella with of which are considered colonization and not active infection. 9/1 completed recommended course of IV vancomycin 9/2 increased frequency of IV Ativan in the past 24 hours-noted increased heart rate in BP-clear if Ativan usage secondary to vent dyssynchrony or other issues.  Subjective: Patient remains unresponsive except to painful stimulus, without grimacing.  Stimulation also precipitates focal seizure activity.   Objective: Vitals:   01/01/21 0700 01/01/21 0800  BP: (!) 153/91 (!) 88/50  Pulse: (!) 106 87  Resp: (!) 22 16  Temp:  98.5 F (36.9 C)  SpO2: 100% 99%    Intake/Output Summary (Last 24 hours) at 01/01/2021 0820 Last data filed at 01/01/2021 0800 Gross per 24 hour  Intake 2559.8 ml  Output 2925 ml  Net -365.2 ml    Filed Weights   12/29/20 0200 12/30/20 0351 12/31/20 0500  Weight: 90.8 kg 91.8 kg 90.2 kg    Exam:  Constitutional: Unresponsive  Respiratory: 8.0 cuffed Bivona air trach, ventilator settings: PRVC mode,VT 400, Rate 16, PEEP 5, PS 15, FiO2 30%; Lungs clear to auscultation, decreased in the bases oximetry 98 to 99% Cardiovascular: ST,stable peripheral edema, normotensive on midodrine Abdomen: Nondistended with normoactive bowel sounds. PEG tube for feedings. LBM 9/12 Skin: Stage II decubitus stable Neurologic: Remains unresponsive.  Does not grimace with painful stimulus.  Eyes deviated to the right today.  Decerebrate posturing w/ application of painful stimulus.  Withdraws right side extremities to deep painful stimulus less than previous.  Pupils: Right 2 mm and left 4 mm with very sluggish reaction  Psychiatric: Eyes closed.  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  Updated on current  clinical status and lack of improvement with emphasis on futility of care.  Multiple questions were asked by these family members.  When I began speaking about withdrawal of care after institution of DNR the daughter became very emotional stating she had never been told this before.  In review of previous palliative medicine notes pretty much every discussion regarding end-of-life care and futility of care were met with resistance and interruptions.  The daughter did state that the only thing she had ever been told in regards to changing care was DNR.  Did explain that that would be appropriate but that at some point given the fact that her mother is not improving that we would eventually need to talk about withdrawing care since this would be in line with the patient's advanced directives.  At this point the daughter again became emotional discussing all the stress that she is under and that she needed at least 2 weeks to process this information.  Although she is listed as the primary decision maker in the chart she states that her brother who was incarcerated also has these rights and would need to know what was going on.  I did tell her that if possible I would try to figure out how to have a telephone conversation with him and if appropriate we would try to bring him to the bedside.  Have since learned from the social worker that this would not be possible and that it is up to the daughter to visit the patient during visiting hours and update him on the patient's status.  The daughter remained recalcitrant regarding decision-making and even backtracked on making the patient a DNR stating" well if she is not going to survive a resuscitation anyways why do I have to make her a DNR".  She also stated "it does not matter if she is on a machine or not or if you guys are doing treatments for her if God's going to take her he will take her regardless".  Again I stated that performing CPR/full resuscitative would not  be of any benefit to her mom and would actually potentially cause harm such as rib breaking.  Also resuscitating her at this point would go against her advanced directive.  At this point the patient began crying and stating that information regarding withdrawal of care was very new to her (which I agreed after reviewing notes throughout the hospitalization regarding prior discussions) and that she was just not ready to make this decision and needed to speak to other family members.  I empathized with her and gave her emotional support and also told her I would be praying for her because I have been through a similar situation with my mother.  Family is aware that towards the end of this week I will be on vacation and I will touch base with them again next Monday. Given the complexity of the situation even though the patient does meet criteria for instituting futility of care policy I am concerned that the patient's family are not even close to being ready therefore I have sent a message to Dr. Hulen Skains with hospital administration to help with the situation  Seizure disorder Chronic condition-worsened since her anoxic brain injury LTM EEG obtained on 8/19-20 demonstrating continued seizure spikes-neurology evaluated.  Unfortunately patient on max therapy with AEDs and no additional  treatment options available  History of cardiac arrest w/ resultant anoxic brain injury Cardiac catheterization revealed no evidence of CAD  MDR bacteremia secondary to staph epidermis/capitis/colonization of VRE and Klebsiella in urine Completed 7 days of IV vancomycin as recommended by ID  Intermittent hypotension secondary to dysautonomia from brain injury Continue midodrine 2.5 mg prn for SBP </= 85  History of CVA Continue Plavix   Hyponatremia Resolved   Diabetes mellitus 2 on long-term insulin with hyperglycemia Borderline hypoglycemia in the past 24 hours so we will decrease Semglee from 20 units to 15  units Continue SSI   Hypertension Blood pressure times has been suboptimal therefore beta-blocker discontinued.   Chronic diastolic dysfunction Not on pharmacotherapy secondary to suboptimal blood pressure readings   Chronic protein malnutrition secondary to sequelae from anoxic brain injury/PEG tube dependence Continue current tube feedings with Prosource  Anemia of chronic disease Hemoglobin remained stable around 10  Stage II sacral decubitus Continue current wound care    Data Reviewed: Basic Metabolic Panel: No results for input(s): NA, K, CL, CO2, GLUCOSE, BUN, CREATININE, CALCIUM, MG, PHOS in the last 168 hours.   CBC: No results for input(s): WBC, NEUTROABS, HGB, HCT, MCV, PLT in the last 168 hours.    CBG: Recent Labs  Lab 12/31/20 1132 12/31/20 1558 12/31/20 2003 12/31/20 2332 01/01/21 0338  GLUCAP 123* 127* 146* 149* 121*     Scheduled Meds:  arformoterol  15 mcg Nebulization BID   baclofen  5 mg Per Tube TID   budesonide  0.5 mg Nebulization BID   chlorhexidine gluconate (MEDLINE KIT)  15 mL Mouth Rinse BID   Chlorhexidine Gluconate Cloth  6 each Topical Q2000   clonazePAM  1 mg Per Tube BID   clopidogrel  75 mg Per Tube Daily   enoxaparin (LOVENOX) injection  0.5 mg/kg Subcutaneous Q24H   famotidine  20 mg Per Tube BID   feeding supplement (PROSource TF)  45 mL Per Tube BID   fiber  1 packet Per Tube BID   free water  200 mL Per Tube Q4H   Gerhardt's butt cream   Topical BID   guaiFENesin  5 mL Per Tube Q6H   hydrocortisone cream   Topical TID   insulin aspart  0-15 Units Subcutaneous Q4H   insulin glargine-yfgn  10 Units Subcutaneous QHS   levETIRAcetam  2,500 mg Per Tube BID   loratadine  10 mg Per Tube Daily   mouth rinse  15 mL Mouth Rinse 10 times per day   montelukast  10 mg Per Tube Daily   revefenacin  175 mcg Nebulization Daily   tiZANidine  2 mg Per Tube QHS   valproic acid  1,000 mg Per Tube Q12H   Continuous Infusions:   sodium chloride 10 mL/hr at 01/01/21 0800   feeding supplement (JEVITY 1.5 CAL/FIBER) 1,000 mL (12/31/20 1617)    Principal Problem:   Sepsis ([place]) Active Problems:   COPD (chronic obstructive pulmonary disease) (HCC)   Chronic diastolic CHF (congestive heart failure) (HCC)   Acute on chronic respiratory failure with hypoxia (HCC)   GERD (gastroesophageal reflux disease)   Seizures (HCC)   Type 2 diabetes mellitus without complication, with long-term current use of insulin (HCC)   Severe hypoxic-ischemic encephalopathy   Tracheostomy dependence (HCC)   Pressure injury of skin   Difficult airway   Ventilator dependence (HCC)   Acquired tracheal collapse   Anoxic brain injury ([place])   MRSA bacteremia   VRE infection (vancomycin  resistant Enterococcus)   Klebsiella cystitis   Bacteremia   Consultants: PCCM Palliative medicine Neurology  Procedures: Echocardiogram Replacement of trach  Antibiotics: Cefepime 6/12 through 6/14 Flagyl 6/12 through 6/14 Vancomycin 6/12 through 6/14 Cefepime x1 dose 6/18 Vancomycin x2 doses 6/18, 6/19 Vancomycin 8/26 through 9/1   Time spent: 55 minutes    Erin Hearing ANP  [facility] 7 am - 330 pm/M-F for direct patient care and secure chat Please refer to [place] for contact info 92  days

## 2021-01-01 NOTE — Plan of Care (Signed)
  Problem: Health Behavior/Discharge Planning: Goal: Ability to manage health-related needs will improve Outcome: Not Progressing   Problem: Clinical Measurements: Goal: Ability to maintain clinical measurements within normal limits will improve Outcome: Not Progressing Goal: Will remain free from infection Outcome: Not Progressing Goal: Diagnostic test results will improve Outcome: Not Progressing Goal: Respiratory complications will improve Outcome: Not Progressing Goal: Cardiovascular complication will be avoided Outcome: Not Progressing   Problem: Activity: Goal: Risk for activity intolerance will decrease Outcome: Not Progressing   Problem: Elimination: Goal: Will not experience complications related to bowel motility Outcome: Not Progressing Goal: Will not experience complications related to urinary retention Outcome: Not Progressing   Problem: Pain Managment: Goal: General experience of comfort will improve Outcome: Not Progressing   Problem: Safety: Goal: Ability to remain free from injury will improve Outcome: Not Progressing   Problem: Skin Integrity: Goal: Risk for impaired skin integrity will decrease Outcome: Not Progressing   Problem: Activity: Goal: Ability to tolerate increased activity will improve Outcome: Not Progressing   Problem: Respiratory: Goal: Ability to maintain a clear airway and adequate ventilation will improve Outcome: Not Progressing   Problem: Activity: Goal: Ability to tolerate increased activity will improve Outcome: Not Progressing   Problem: Health Behavior/Discharge Planning: Goal: Ability to manage tracheostomy will improve Outcome: Not Progressing   Problem: Respiratory: Goal: Patent airway maintenance will improve Outcome: Not Progressing   

## 2021-01-01 NOTE — Progress Notes (Signed)
NAME:  AYLEN STRADFORD, MRN:  [id number], DOB:  [birth year], LOS: 92 ADMISSION DATE:  20-Oct-2020, CONSULTATION DATE:  10/04/2020 REFERRING MD:  Thedore Mins, CHIEF COMPLAINT:  Respiratory Failure   History of Present Illness:  Patient is a 65 years old female with history of asthma/COPD, congestive heart failure, cardiac arrest with PEA, chronic respiratory failure on permanent trach at 6 L/min, type 2 diabetes mellitus, GERD, seizures and severe hypoxic ischemic encephalopathy presented to the hospital with respiratory distress from skilled nursing facility.  Patient was noted to have aspiration pneumonitis and was put on mechanical ventilation.  She also underwent bronchoscopy on 6/16 which showed collapse of her trachea so tracheostomy was subsequently changed.  Currently on ventilator support through tracheostomy.  Patient has poor overall prognosis due to seizure-like activity during hospitalization.   Family wishes to give patient a full 90 days from date of admission to see if there is any improvement.  This time frame was based on patient's advanced directive.  Pertinent  Medical History   Past Medical History:  Diagnosis Date   Arthritis    "right knee; left hip" (04/07/2017)   Asthma    CHF (congestive heart failure) (HCC)    reported treatment for fluid overload in the setting of asthma exacerbation ~ 1998   COPD (chronic obstructive pulmonary disease) (HCC)    patient states she does not have COPD but was treated for it.    Depression    "from chemical imbalance" (04/07/2017)   Dyspnea    Dysrhythmia    occasional skipped beats   GERD (gastroesophageal reflux disease)    On home oxygen therapy    "I sleep with 2L" (04/07/2017)   Pneumonia    "several times" (04/07/2017)   Seizures (HCC) 2003 X 1   unknown cause     Significant Hospital Events: Including procedures, antibiotic start and stop dates in addition to other pertinent events   6/12 admitted from skilled nursing facility  after aspiration event and copious secretions per tracheostomy 6/13 appears comfortable currently on 35% ATC. No distress. WBC ct trending down. No events overnight growing GPC clusters in two of two cultures  6/16 increased work of breathing, upper airway noise, abdominal muscle use.  Bronchoscopy performed as trach exchange cuffless #8 Shiley XLT showed dynamic airway collapse, tracheal collapse. 6/16 continued respiratory distress moved to ICU, changed to cuffed #6 Shiley XLT, placed on MV with some improved comfort 6/12 cefepime > 6/14, 6/18 6/12 vanc > 6/14, 6/18 6/23 mucus plugging, severe vent dyssynchrony 6/24-25 intermittent jerking, seizures on EEG 6/27 weaned off propofol gtt 6/28 briefly tolerated trach collar in AM 6/29 tolerated trach collar in AM; no further seizure like activity; working with CM/SW for vent-SNF 7/1 Weaning on CPAP/ PS 10/5, CM SW working on [employer], NO seizure activity, secretions are not an issue per nursing 7/11 trach changed to #8 Bivona with air cuff, much improved. 7/12 TCT 12 hours; rested on Vent overnight 7/13 on TCT 7/23 Trach collar tolerated 7/24 Full vent support 7/26>> Remains on full vent support this am, no issues with secretions per nursing 8/5 occluding trach with dynamic collapse: seen on bronchoscopy 8/6 called back emergently to bedside for tracheostomy occlusion: emergent bronch, bivona trach migrated back 2cm, improved with advancing to 2cm above carina 8/8 collapse caused hypoxia. Improved with repositioning  8/9 afternoon had another episode of tracheal collapse and occlusion of trach requiring BVM, sedation. Family produced paperwork which seems to have conflicting information about pts  wishes  8/11: pt continues to have multiple daily episodes of tracheal collapse/mucous plugging req lavage, BVM. 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  Neuro consulted 8/30 overnight , BP drop which responded to fluid bolus 9/2 Continues to have labile BP, now midodrine prn  Interim History / Subjective:  No significant events overnight. Patient still dependent on ventilator with some decerebrate positioning. Family wished to give patient a full 90 days to see if any improvement. Plan for discussion of next steps today given no improvement in prognosis after 91 days.  Objective   Blood pressure (!) 88/50, pulse 87, temperature 98.5 F (36.9 C), temperature source Axillary, resp. rate 16, height 5\' 2"  (1.575 m), weight 90.2 kg, SpO2 99 %.    Vent Mode: PRVC FiO2 (%):  [30 %] 30 % Set Rate:  [16 bmp] 16 bmp Vt Set:  [400 mL] 400 mL PEEP:  [5 cmH20] 5 cmH20 Plateau Pressure:  [1 cmH20-26 cmH20] 1 cmH20   Intake/Output Summary (Last 24 hours) at 01/01/2021 0838 Last data filed at 01/01/2021 0800 Gross per 24 hour  Intake 2559.8 ml  Output 2925 ml  Net -365.2 ml   Filed Weights   12/29/20 0200 12/30/20 0351 12/31/20 0500  Weight: 90.8 kg 91.8 kg 90.2 kg    Examination: General: Not sedated but not responsive on exam, some decerebrate positioning noted. No distress HENT: Pupils equal and small, oral mucosa moist, tracheostomy in place.  Lungs: mechanically ventilated, clear, equal chest rise Cardiovascular: regular rate and rhythm, no m/r/g Abdomen: soft, non distended, no sign of tenderness Extremities: trace dependent edema, warm Neuro: does not follow commands or open eyes to voice. No movement to pain   Assessment & Plan:  Anoxic encephalopathy / anoxic brain injury w/ PVS Acute on chronic respiratory failure w hypercarbia and hypoxia  Ventilator dependence  Severe tracheobronchomalacia Tracheostomy dependent Hx COPD  Dynamic airway collapse Nona remains ventilator dependent due to her anoxic injury but also due to her dynamic airway collapse from severe tracheobronchomalacia. She is unlikely to ever come off the  ventilator   Plan Cont current full vent support Cont BDs and nebulization treatments VAP bundle Discuss goals of care with family given no improvement in prognosis in 90 days.    All other interventions per IM service.  Pulmonary will follow intermittently  Best Practice (right click and "Reselect all SmartList Selections" daily)   Diet/type: tubefeeds DVT prophylaxis: LMWH GI prophylaxis: H2B Lines: N/A Foley:  N/A Code Status:  full code Last date of multidisciplinary goals of care discussion [ Planned for 9/12]  Labs   CBC: No results for input(s): WBC, NEUTROABS, HGB, HCT, MCV, PLT in the last 168 hours.  Basic Metabolic Panel: No results for input(s): NA, K, CL, CO2, GLUCOSE, BUN, CREATININE, CALCIUM, MG, PHOS in the last 168 hours. GFR: Estimated Creatinine Clearance: 72.2 mL/min (A) (by C-G formula based on SCr of 0.37 mg/dL (L)). No results for input(s): PROCALCITON, WBC, LATICACIDVEN in the last 168 hours.  Liver Function Tests: No results for input(s): AST, ALT, ALKPHOS, BILITOT, PROT, ALBUMIN in the last 168 hours. No results for input(s): LIPASE, AMYLASE in the last 168 hours. No results for input(s): AMMONIA in the last 168 hours.  ABG    Component Value Date/Time   PHART 7.414 11/20/2020 1253   PCO2ART 52.3 (H) 11/20/2020 1253   PO2ART 127 (H) 11/20/2020 1253  HCO3 33.6 (H) 11/20/2020 1253   TCO2 35 (H) 11/20/2020 1253   O2SAT 99.0 11/20/2020 1253     Coagulation Profile: No results for input(s): INR, PROTIME in the last 168 hours.  Cardiac Enzymes: No results for input(s): CKTOTAL, CKMB, CKMBINDEX, TROPONINI in the last 168 hours.  HbA1C: Hgb A1c MFr Bld  Date/Time Value Ref Range Status  10/02/2020 06:31 PM 7.0 (H) 4.8 - 5.6 % Final    Comment:    (NOTE)         Prediabetes: 5.7 - 6.4         Diabetes: >6.4         Glycemic control for adults with diabetes: <7.0   08/30/2019 04:53 AM 6.5 (H) 4.8 - 5.6 % Final    Comment:     (NOTE) Pre diabetes:          5.7%-6.4% Diabetes:              >6.4% Glycemic control for   <7.0% adults with diabetes     CBG: Recent Labs  Lab 12/31/20 1132 12/31/20 1558 12/31/20 2003 12/31/20 2332 01/01/21 0338  GLUCAP 123* 127* 146* 149* 121*    Critical care time:    Lita Mains, MS4

## 2021-01-02 DIAGNOSIS — G931 Anoxic brain damage, not elsewhere classified: Secondary | ICD-10-CM | POA: Diagnosis not present

## 2021-01-02 DIAGNOSIS — J398 Other specified diseases of upper respiratory tract: Secondary | ICD-10-CM | POA: Diagnosis not present

## 2021-01-02 DIAGNOSIS — J9621 Acute and chronic respiratory failure with hypoxia: Secondary | ICD-10-CM | POA: Diagnosis not present

## 2021-01-02 DIAGNOSIS — R569 Unspecified convulsions: Secondary | ICD-10-CM | POA: Diagnosis not present

## 2021-01-02 LAB — GLUCOSE, CAPILLARY
Glucose-Capillary: 115 mg/dL — ABNORMAL HIGH (ref 70–99)
Glucose-Capillary: 115 mg/dL — ABNORMAL HIGH (ref 70–99)
Glucose-Capillary: 122 mg/dL — ABNORMAL HIGH (ref 70–99)
Glucose-Capillary: 134 mg/dL — ABNORMAL HIGH (ref 70–99)
Glucose-Capillary: 135 mg/dL — ABNORMAL HIGH (ref 70–99)
Glucose-Capillary: 156 mg/dL — ABNORMAL HIGH (ref 70–99)
Glucose-Capillary: 97 mg/dL (ref 70–99)

## 2021-01-02 NOTE — Progress Notes (Signed)
If not already apparent from previous notes, I believe further mechanical ventilation for Ms. Sonia Drake is not medically beneficial aka futile.  Furthermore, shocks and chest compressions will not meaningfully prolong her life.  Happy to help primary follow futility policy.  Myrla Halsted MD PCCM

## 2021-01-02 NOTE — Progress Notes (Signed)
Nutrition Follow-up  DOCUMENTATION CODES:   Not applicable  INTERVENTION:   Continue tube feeds via PEG: - Jevity 1.5 @ 45 ml/hr (1080 ml/day) - ProSource TF 45 ml BID - Free water flushes of 200 ml q 4 hours   Tube feeding regimen provides 1700 kcal, 91 grams of protein, and 821 ml of H2O.   Total free water with flushes: 2280 ml  NUTRITION DIAGNOSIS:   Increased nutrient needs related to wound healing as evidenced by estimated needs.  Ongoing, being addressed via TF  GOAL:   Patient will meet greater than or equal to 90% of their needs  Met via TF  MONITOR:   Vent status, Labs, Weight trends, TF tolerance, Skin, I & O's  REASON FOR ASSESSMENT:   Consult Enteral/tube feeding initiation and management  ASSESSMENT:   Sonia Drake is a 65 y.o. female with medical history significant of asthma/COPD, CHF, cardiac arrest with PEA/CPR with chronic respiratory failure on permanent trach with supplemental oxygen at 6L, type 2 DM, GERD, seizures and severe hypoxic-ischemic encephalopathy who presented to ER via EMS for respiratory distress from SNF.  Discussed pt with RN and during ICU rounds. Providers meeting today to discuss further plan of care. Extensive conversations held with family regarding [place]. Pt tolerating current TF without issue. Will continue with current TF regimen.  Current TF: Jevity 1.5 @ 45 ml/hr, ProSource TF 45 ml BID, free water flushes 200 ml q 4 hours  Admit weight: 88.4 kg Current weight: 90.8 kg  Patient remains on ventilator support via trach MV: 7.9 L/min Temp (24hrs), Avg:98.1 F (36.7 C), Min:97.7 F (36.5 C), Max:98.5 F (36.9 C)  Medications reviewed and include: pepcid, nutrisource fiber BID, SSI q 4 hours, semglee 10 units daily  Labs reviewed. CBG's: 115-163 x 24 hours  UOP: 2350 ml x 24 hours I/O's: +30.7 L since admit  Diet Order:   Diet Order             Diet NPO time specified  Diet effective now                    EDUCATION NEEDS:   No education needs have been identified at this time  Skin:  Skin Assessment: Skin Integrity Issues: Other: MASD to buttocks  Last BM:  01/02/21 small type 6  Height:   Ht Readings from Last 1 Encounters:  12/25/20 _0  (1.575 m)    Weight:   Wt Readings from Last 1 Encounters:  01/02/21 90.8 kg    Ideal Body Weight:  45.5 kg  BMI:  Body mass index is 36.61 kg/m.  Estimated Nutritional Needs:   Kcal:  1600-1800  Protein:  90-105 grams  Fluid:  > 1.6 L    Gustavus Bryant, MS, RD, LDN Inpatient Clinical Dietitian Please see AMiON for contact information.

## 2021-01-02 NOTE — Progress Notes (Addendum)
[facility] PROGRESS NOTE  Sonia Drake SJG:[id number] DOB: 1955-08-29 DOA: 09/23/2020 PCP: Garwin Brothers, MD  Status: Remains inpatient appropriate because:Altered mental status, Unsafe d/c plan, and Inpatient level of care appropriate due to severity of illness  Dispo: The patient is from: SNF              Anticipated d/c is to: SNF Ventilator but due to the fact she has an adjustable trach and given her recurrent airway issues related to recurrent tracheal collapse and mucous plugging patient has not received any bed offers              Patient currently is not medically stable to d/c.   Difficult to place patient Yes   Level of care: ICU  CODE STATUS: Full Family Communication: Spoke with patient's daughter in detail on 8/17.  9/12 extensive conversation held with both patient's daughter and patient's cousin regarding DNR and end-of-life care.  Neither party was ready to accept this as fact.  Please see detailed note below DVT prophylaxis: Lovenox COVID vaccination status: Per H&P patient is not vaccinated    HPI: 65 y/o female with medical history significant of asthma/COPD, CHF, cardiac arrest with PEA/CPR with chronic respiratory failure on permanent trach with supplemental oxygen at 6L, type 2 DM, GERD, seizures and severe hypoxic-ischemic encephalopathy who presented to ER via EMS for respiratory distress from SNF.  She presented with findings consistent with acute aspiration pneumonitis.  She decompensated after arrival and required transfer to ICU and initiation of mechanical ventilation.  She had sepsis physiology presumed secondary to [facility] bacteremia but it was later determined that the blood culture results were related to contaminants and patient was no longer determined to be septic   On 6/16 a bronchoscopy revealed dynamic collapse of her trachea, tracheostomy was changed from 8.0 XLT cuffless to 6.0 XLT cuffed. She now is requiring chronic ventilatory support 2/2  advanced COPD/asthma and tracheobronchomalacia. PCCM physicians updated patient's daughter on status and poor prognosis along with recommendation for chronic vent.  Palliative medicine has had multiple discussions with the daughter.  Daughter continues to request aggressive care.  Unfortunately she continues to have multiple daily events regarding tracheal collapse with mucous plugging requiring excessive lavage and suction as well as bag-valve-mask for ventilation  Patient has had reemergence of continuous seizure activity as evidenced by rhythmic blinking.  Continuous EEG revealed ongoing seizure activity.  Family wishes to give patient a full 90 days from date of admission to see if there is any improvement.  This time frame was based on patient's advanced directive.  Significant Hospital events: 6/12 admitted from skilled nursing facility after aspiration event and copious secretions per tracheostomy 6/13 appears comfortable currently on 35% ATC. No distress. WBC ct trending down. No events overnight growing GPC clusters in two of two cultures  6/16 increased work of breathing, upper airway noise, abdominal muscle use.  Bronchoscopy performed as trach exchange cuffless #8 Shiley XLT showed dynamic airway collapse, tracheal collapse. 6/16 continued respiratory distress moved to ICU, changed to cuffed #6 Shiley XLT, placed on MV with some improved comfort 6/12 cefepime > 6/14, 6/18 6/12 vanc > 6/14, 6/18 6/23 mucus plugging, severe vent dyssynchrony 6/24-25 intermittent jerking, seizures on EEG 6/27 weaned off propofol gtt 6/28 briefly tolerated trach collar in AM 6/29 tolerated trach collar in AM; no further seizure like activity; working with CM/SW for vent-SNF 7/1 Weaning on CPAP/ PS 10/5, CM SW working on [employer], NO seizure activity, secretions  are not an issue per nursing 7/11 trach changed to #8 Bivona with air cuff, much improved. 7/12 TCT 12 hours; rested on Vent overnight 7/13 on  TCT 7/23 Trach collar tolerated 7/24 Full vent support 7/26>> Remains on full vent support this am, no issues with secretions per nursing 8/5 occluding trach with dynamic collapse: seen on bronchoscopy 8/6 called back emergently to bedside for tracheostomy occlusion: emergent bronch, bivona trach migrated back 2cm, improved with advancing to 2cm above carina 8/8 collapse caused hypoxia. Improved with repositioning  8/9 afternoon had another episode of tracheal collapse and occlusion of trach requiring BVM, sedation. Family produced paperwork which seems to have conflicting information about pts wishes  8/11: pt continues to have multiple daily episodes of tracheal collapse/mucous plugging req lavage, BVM. Repeated discussions with family re: code status esp in light of paperwork  8/15 consultation placed with ethics committee to evaluate regarding futility of care 8/17 spoke at length with patient's daughter regarding futility of care CODE STATUS.  Daughter continues to desire aggressive measures. 8/26 through 8/27 blood cultures positive for MDR staph epidermis/capitis and is currently on IV vancomycin 8/28 urine culture positive for VRE and Klebsiella with of which are considered colonization and not active infection. 9/1 completed recommended course of IV vancomycin 9/2 increased frequency of IV Ativan in the past 24 hours-noted increased heart rate in BP-clear if Ativan usage secondary to vent dyssynchrony or other issues. 9/12 extensive conversation held with daughter and patient niece.  Considering DNR.  Comfort measures and terminal weaning discussed but they were adamant about not being ready including not ready to give up on patient.   Subjective: Unresponsive except to deep painful stimulus that is not associated with grimacing any purposeful movement.   Objective: Vitals:   01/02/21 0637 01/02/21 0700  BP: 129/90 92/62  Pulse: 82 77  Resp: 16 16  Temp:    SpO2: 99% 99%     Intake/Output Summary (Last 24 hours) at 01/02/2021 0733 Last data filed at 01/02/2021 0600 Gross per 24 hour  Intake 2324.94 ml  Output 2350 ml  Net -25.06 ml   Filed Weights   12/30/20 0351 12/31/20 0500 01/02/21 0425  Weight: 91.8 kg 90.2 kg 90.8 kg    Exam:  Constitutional: Unresponsive except to painful stimulus Respiratory: 8.0 cuffed Bivona air trach, ventilator settings: PRVC mode,VT 400, Rate 16, PEEP 5, PS 15, FiO2 30%; Lungs bilateral inspiratory and expiratory wheezing, decreased in the bases oximetry 98 to 99% Cardiovascular: ST without ectopy,stable peripheral edema, normotensive on midodrine Abdomen: Nondistended with normoactive bowel sounds. PEG tube for feedings. LBM 9/12 Skin: Stage II decubitus stable Neurologic: Remains unresponsive.  Does not grimace with painful stimulus.  Eyes deviated to the right today.  Decerebrate posturing w/ application of painful stimulus.  Withdraws right side extremities to deep painful stimulus less than previous.  Pupils: Right 2 mm and left 4 mm with very sluggish reaction -continues to have focal seizure activity with stimulation as evidenced by rhythmic eye blinking and focal facial twitching Psychiatric: Eyes closed.  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  Daughter expresses continued issues with stressors outside of her mother's current condition and states she would like at least 2 more weeks to process information presented on 9/12.  She is considering DNR status.  9/12 was the first time information was presented to her about transitioning to comfort care measures with eventual terminal wean from the ventilator.  Daughter was not prepared for this information and became extremely emotional.  Plan is to touch base with family again on 9/19 per their request. PCCM updated regarding above conversation.  Dr. Lake Bells to assist with this process.  Also given complexities with daughter's ability to process her mother's unrecoverable and terminal condition hospital administration also updated.  Continued aggressive care is medically futile given patient's unrecoverable clinical state.  Continued aggressive measures would only lead to harm and would not meaningfully prolonging her life  Seizure disorder Chronic condition-worsened since her anoxic brain injury LTM EEG obtained on 8/19-20 demonstrating continued seizure spikes-neurology evaluated.  Unfortunately patient on max therapy with AEDs and no additional treatment options available  History of cardiac arrest w/ resultant anoxic brain injury Cardiac catheterization revealed no evidence of CAD  MDR bacteremia secondary to staph epidermis/capitis/colonization of VRE and Klebsiella in urine Completed 7 days of IV vancomycin as recommended by ID  Intermittent hypotension secondary to dysautonomia from brain injury Continue midodrine 2.5 mg prn for SBP </= 85  History of  CVA Continue Plavix   Hyponatremia Resolved   Diabetes mellitus 2 on long-term insulin with hyperglycemia Borderline hypoglycemia in the past 24 hours so we will decrease Semglee from 20 units to 15 units Continue SSI   Hypertension Blood pressure times has been suboptimal therefore beta-blocker discontinued.   Chronic diastolic dysfunction Not on pharmacotherapy secondary to suboptimal blood pressure readings   Chronic protein malnutrition secondary to sequelae from anoxic brain injury/PEG tube dependence Continue current tube feedings with Prosource  Anemia of chronic disease Hemoglobin remained stable around 10  Stage II sacral decubitus Continue current wound care    Data Reviewed: Basic Metabolic Panel: No results for input(s): NA, K, CL, CO2, GLUCOSE, BUN, CREATININE, CALCIUM, MG, PHOS in the last 168 hours.   CBC: No results for input(s): WBC, NEUTROABS, HGB, HCT, MCV, PLT in the last 168 hours.    CBG: Recent Labs  Lab 01/01/21 1107 01/01/21 1525 01/01/21 1914 01/01/21 2314 01/02/21 0314  GLUCAP 130* 140* 163* 137* 115*     Scheduled Meds:  arformoterol  15 mcg Nebulization BID   baclofen  5 mg Per Tube TID   budesonide  0.5 mg Nebulization BID   chlorhexidine gluconate (MEDLINE KIT)  15 mL Mouth Rinse BID   Chlorhexidine Gluconate Cloth  6 each Topical Q2000   clonazePAM  1 mg Per Tube BID   clopidogrel  75 mg Per Tube Daily   enoxaparin (LOVENOX) injection  0.5 mg/kg Subcutaneous Q24H   famotidine  20 mg Per Tube BID   feeding supplement (PROSource TF)  45 mL Per Tube BID   fiber  1 packet Per Tube BID  free water  200 mL Per Tube Q4H   Gerhardt's butt cream   Topical BID   guaiFENesin  5 mL Per Tube Q6H   hydrocortisone cream   Topical TID   insulin aspart  0-15 Units Subcutaneous Q4H   insulin glargine-yfgn  10 Units Subcutaneous QHS   levETIRAcetam  2,500 mg Per Tube BID   loratadine  10 mg Per Tube Daily   mouth rinse  15 mL Mouth  Rinse 10 times per day   montelukast  10 mg Per Tube Daily   revefenacin  175 mcg Nebulization Daily   tiZANidine  2 mg Per Tube QHS   valproic acid  1,000 mg Per Tube Q12H   Continuous Infusions:  sodium chloride 10 mL/hr at 01/01/21 1800   feeding supplement (JEVITY 1.5 CAL/FIBER) 1,000 mL (01/01/21 1801)    Principal Problem:   Sepsis ([place]) Active Problems:   COPD (chronic obstructive pulmonary disease) (HCC)   Chronic diastolic CHF (congestive heart failure) (HCC)   Acute on chronic respiratory failure with hypoxia (HCC)   GERD (gastroesophageal reflux disease)   Seizures ([place])   Type 2 diabetes mellitus without complication, with long-term current use of insulin (HCC)   Severe hypoxic-ischemic encephalopathy   Tracheostomy dependence ([place])   Pressure injury of skin   Difficult airway   Ventilator dependence ([place])   Acquired tracheal collapse   Anoxic brain injury ([place])   MRSA bacteremia   VRE infection (vancomycin resistant Enterococcus)   Klebsiella cystitis   Bacteremia   Consultants: PCCM Palliative medicine Neurology  Procedures: Echocardiogram Replacement of trach  Antibiotics: Cefepime 6/12 through 6/14 Flagyl 6/12 through 6/14 Vancomycin 6/12 through 6/14 Cefepime x1 dose 6/18 Vancomycin x2 doses 6/18, 6/19 Vancomycin 8/26 through 9/1   Time spent: 55 minutes    Erin Hearing ANP  [facility] 7 am - 330 pm/M-F for direct patient care and secure chat Please refer to Amion for contact info 93  days

## 2021-01-02 NOTE — Progress Notes (Signed)
PROGRESS NOTE  CALIEGH MIDDLEKAUFF XVQ:[id number] DOB: 06-01-1955 DOA: 10/03/2020 PCP: Eloisa Northern, MD   LOS: 93 days   Brief narrative: Patient is a 65 years old female with history of asthma/COPD, congestive heart failure, cardiac arrest with PEA, chronic respiratory failure on permanent trach at 6 L/min, type 2 diabetes mellitus, GERD, seizures and severe hypoxic ischemic encephalopathy presented to the hospital with respiratory distress from skilled nursing facility.  Patient was noted to have aspiration pneumonitis and was put on mechanical ventilation.  She also underwent bronchoscopy on 616 which showed collapse of her trachea so tracheostomy was subsequently changed.  Currently on ventilator support through tracheostomy.  Patient has poor overall prognosis due to seizure-like activity during hospitalization.     Assessment/Plan:  Principal Problem:   Sepsis (HCC) Active Problems:   COPD (chronic obstructive pulmonary disease) (HCC)   Chronic diastolic CHF (congestive heart failure) (HCC)   Acute on chronic respiratory failure with hypoxia (HCC)   GERD (gastroesophageal reflux disease)   Seizures (HCC)   Type 2 diabetes mellitus without complication, with long-term current use of insulin (HCC)   Severe hypoxic-ischemic encephalopathy   Tracheostomy dependence (HCC)   Pressure injury of skin   Difficult airway   Ventilator dependence (HCC)   Acquired tracheal collapse   Anoxic brain injury (HCC)   MRSA bacteremia   VRE infection (vancomycin resistant Enterococcus)   Klebsiella cystitis   Bacteremia  Acute on chronic respiratory failure w/ hypoxemia with 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  CPAP was tried on 01/07/2021 but she could not tolerate so patient has been  transitioned back to [facility] mode.   Known anoxic brain injury after cardiac arrest  Response mildly to deep painful stimulus.  Seen by neurology on this admission.  Ethics team on board.     Seizure disorder On maximal therapy with AEDs.  No other additional treatment available.     History of cardiac arrest w/ resultant anoxic brain injury Cardiac catheterization revealed no evidence of CAD.  Continue supportive care.   MDR bacteremia secondary to staph epidermis/capitis/colonization of VRE and Klebsiella in urine Completed 7 days of IV vancomycin as recommended by ID.  Not currently on any antibiotics.   Intermittent hypotension secondary to dysautonomia from brain injury, history of hypertension. Currently on midodrine.    Beta-blocker has been discontinued at this time.   History of CVA Continue Plavix   Hyponatremia Resolved.  No recent labs.   Diabetes mellitus 2 on long-term insulin with hyperglycemia On Long-acting insulin,Continue sliding scale insulin    Chronic diastolic dysfunction Not on pharmacotherapy due to suboptimal blood pressure.   Chronic protein malnutrition secondary to sequelae from anoxic brain injury/PEG tube dependence Continue tube feedings with Prosource.   Anemia of chronic disease Hemoglobin remained stable around 10   Stage II sacral decubitus Continue current wound care   DVT prophylaxis:   Lovenox subcu  Ethics/goals of care.   Overall prognosis of the patient is poor.  There is no indication that any further aggressive or invasive treatment would change the course of events .  Further continuation of aggressive care would mean futile treatment.    Code Status: Full code  Status is: Inpatient  Remains inpatient appropriate because:IV treatments appropriate due to intensity of illness or inability to take PO and Inpatient level of care  appropriate due to severity of illness  Dispo: The patient is from: SNF              Anticipated d/c  is to: undetermined.              Patient currently is not medically stable to d/c.   Difficult to place patient Yes  Consultants: PCCM Palliative medicine Neurology  Procedures: 2D echocardiogram Tracheostomy with ventilation  Anti-infectives:  None at present  Subjective: Today, patient was seen and examined at bedside. No interval complaints reported  Objective: Vitals:   01/02/21 1200 01/02/21 1300  BP: 128/79 130/87  Pulse: (!) 102 88  Resp: 19 17  Temp:    SpO2: 99% 100%    Intake/Output Summary (Last 24 hours) at 01/02/2021 1521 Last data filed at 01/02/2021 1153 Gross per 24 hour  Intake 1810.42 ml  Output 3750 ml  Net -1939.58 ml    Filed Weights   12/30/20 0351 12/31/20 0500 01/02/21 0425  Weight: 91.8 kg 90.2 kg 90.8 kg   Body mass index is 36.61 kg/m.   Physical Exam: GENERAL: Patient awake but not responsive,  HENT: No scleral pallor or icterus. Pupils equally reactive to light. Oral mucosa is moist, tracheostomy in place. NECK: is supple, no gross swelling noted. CHEST: Clear to auscultation. No crackles or wheezes.  Diminished breath sounds bilaterally. CVS: S1 and S2 heard, no murmur. Regular rate and rhythm.  ABDOMEN: Soft, non-tender, bowel sounds are present. EXTREMITIES: Bilateral lower extremity edema CNS: Grimaces with painful stimulus.  Postures sternal rub.  Withdraws to painful stimulus. SKIN: warm and dry without rashes.  Data Review: I have personally reviewed the following laboratory data and studies,  CBC: No results for input(s): WBC, NEUTROABS, HGB, HCT, MCV, PLT in the last 168 hours.  Basic Metabolic Panel: No results for input(s): NA, K, CL, CO2, GLUCOSE, BUN, CREATININE, CALCIUM, MG, PHOS in the last 168 hours.  Liver Function Tests: No results for input(s): AST, ALT, ALKPHOS, BILITOT, PROT, ALBUMIN in the last 168 hours. No results for input(s): LIPASE, AMYLASE in the last 168 hours. No results for input(s): AMMONIA  in the last 168 hours. Cardiac Enzymes: No results for input(s): CKTOTAL, CKMB, CKMBINDEX, TROPONINI in the last 168 hours. BNP (last 3 results) Recent Labs    10/04/20 0051 10/05/20 0049 10/06/20 0548  BNP 145.8* 115.3* 125.4*     ProBNP (last 3 results) No results for input(s): PROBNP in the last 8760 hours.  CBG: Recent Labs  Lab 01/01/21 1914 01/01/21 2314 01/02/21 0314 01/02/21 0757 01/02/21 1142  GLUCAP 163* 137* 115* 156* 115*    No results found for this or any previous visit (from the past 240 hour(s)).   Studies: No results found.    Joycelyn Das, MD  [facility] 01/02/2021  If 7PM-7AM, please contact night-coverage

## 2021-01-03 DIAGNOSIS — J9621 Acute and chronic respiratory failure with hypoxia: Secondary | ICD-10-CM | POA: Diagnosis not present

## 2021-01-03 DIAGNOSIS — A419 Sepsis, unspecified organism: Secondary | ICD-10-CM | POA: Diagnosis not present

## 2021-01-03 DIAGNOSIS — J9601 Acute respiratory failure with hypoxia: Secondary | ICD-10-CM | POA: Diagnosis not present

## 2021-01-03 LAB — GLUCOSE, CAPILLARY
Glucose-Capillary: 115 mg/dL — ABNORMAL HIGH (ref 70–99)
Glucose-Capillary: 128 mg/dL — ABNORMAL HIGH (ref 70–99)
Glucose-Capillary: 155 mg/dL — ABNORMAL HIGH (ref 70–99)
Glucose-Capillary: 168 mg/dL — ABNORMAL HIGH (ref 70–99)

## 2021-01-03 NOTE — Progress Notes (Signed)
CSW present with medical team to discuss implementation of futility policy.  [name], MSW, LCSW Transitions of Care  Clinical Social Worker II [phone]

## 2021-01-03 NOTE — Ethics Note (Signed)
Ethics Consult Note  Follow-up contact w/ medical team 01/03/21, which was on patient's hospital day 94 - initial contact was 12/06/20, which was patient's hospital day 66  Source of Consult: Sonia Silk NP reached out on 12/06/20, today 01/03/21 and yesterday 01/02/21 I spoke with Sonia Laura MD Current attending physician/service: Sonia Drake, *  Reason(s) for consult and ethical question(s): Question medical futility  Help with Advanced Directive    Information-gathering: Discussion with source of consult Chart review 01/03/21  COPY OF ADVANCED DIRECTIVE IS IN CHART    Narrative:  Medical facts: Unfortunate chronically ill patient who currently is ventilatory dependent and suffering from tracheal collapse requiring lavage and BVM.   Patient's Personal/Social Facts: NEW INFORMATION has been nrught to our attention re: ADVANCED DIRECTIVE, copy was reviewed w/ Dr Sonia Drake and w/ nurse today. There has been some concern from medical team regarding the lack of clarity in the patient's instructions, concern that there is a portion of the document that has been crossed out but no signature. Relevant portion (Part A: HCPOA) is copied below. Part B (Living Will page) has been left blank and crossed out. Notary attestation page appears to have been appropriately completed.   Special instructions on directive:  "I Sonia Drake want Sonia Drake who is my niece to help make [decisions] with my daughter, son, and grandchildren with my wishes.  I do not want sedatives, [medication] to keep me sleep.  I want to be fully awake, alert at all times, regardless of pain.  Also, I want all means put in place to keep me alive.  I want to remain on life support until my daughter, son, Sonia Drake and grandchildren decides to consult with medical team.  PS please allow at least 30 to 90 days to see if I can pull through ."       With regard to the applicable underlying ethical  principles, the standards of ethical care and relevant resources were discussed directly with the attending physician.  Ethics committee strives to ensure that all necessary and appropriate steps are taken, such that all decisions made for this patient are ethically justifiable. Ethics does NOT presume to offer legal advice. Ethics offers the following recommendations:      I have discussed this case with two other members of the Ethics Committee (Sonia Drake and Sonia Drake) and we are in agreement on the following    Recommendations:  Given the advanced directive as noted above, while the writing/grammar is a bit haphazard and while there are parts crossed out that may give some pause as these were not signed, assuming this directive is legally sound and appropriately notarized this gives the medical team and the Ethics Committee great insight into what the patient would have autonomously chosen for herself and this allows her to communicate with the medical team via the advanced directive, though she lacks capacity at this point to communicate directly. Therefore, ethically, the guiding principle of respect for patient autonomy takes precedence and the question of futility at this point is relatively moot.    Per her directives above, she has stated that  She would not want to be on sedating medication, and she is currently not on sedating medications  She would want to be kept on life support for a trial of at least 30 to 90 days, "to see if I can pull through" which the medical team has certainly given her as today is hospital day 93.   She wants to remain  on life support until family can consult with medical team, so she clearly values the opinions of her family but also values the opinions of her medical caregivers and experts  Based on wishes as noted in the advanced directive, medical team has honored patient's stated wishes.  Sonia Drake herself would apparently not want to be kept alive indefinitely on  life support, as she indicated re: trial period to allow for improvement or "to see if [she] can pull through." At this point, if aggressive and appropriate medical intervention has not resulted in any improvement in patient's condition, and if patient is not likely to improve, medical team should honor her wishes by planning for compassionate withdrawal of artificial life support.   In conversations going forward with the patient's family, would focus very strongly on the fact that her wishes were very specific and were written down in advance, and the medical team is first and foremost obligated to honor patient autonomy by following her directives rather than family trying to override the patient's wishes if they are asking for indefinite life support.      I have discussed this case with two other members of the Ethics Committee  Thank you for this consult. Ethics will continue to follow this case.   Dr. Maryfrances Drake has my personal cell phone number and has my permission to share this number at their discretion.  Secure message on Epic is also welcome but may not receive an immediate response.  Please reference AMION for on-call committee member if needed.    Sonia Drake [facility] Ethics Committee

## 2021-01-03 NOTE — Progress Notes (Signed)
[facility] PROGRESS NOTE    Sonia Drake  LFY:[id number] DOB: 1955-06-23 DOA: 10/09/2020 PCP: Garwin Brothers, MD      Brief Narrative:  Sonia Drake is a 65 y.o. 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  Subsequent hospital stay has been fairly uncomplicated, although the patient has remained ventilator dependent, has dynamic airway collapse leading to occasional unpredictable episodes of increased respiratory effort and ventilator-respiration dyssynchrony, has occasional myoclonic seizures, and has had no appreciable neurological improvement.         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  Neurology recommend minimizing AEDs, in order to avoid sedation per patients wishes but also because it will lead to no improvement in her seizure frequency or functional status - Continue Keppra - Continue Klonopin - Continue Depakote    Type 2 diabetes Glucose normal - Continue SS corrections   Hypertension Blood pressure soft   Advanced COPD FEV1 2019 was 38% Mild wheezing, SpO2 normal, no evidence  of flare - Continue LABA/LAMA, ICS - Continue Singulair, Claritin, famotidine  Cerebrovascular disease, secondary prevention - Continue Plavix   Obesity BMI 36            Disposition: Status is: Inpatient  Remains inpatient appropriate because:Unsafe d/c plan  Dispo: The patient is from: SNF              Anticipated d/c is to: LTAC              Patient currently is medically stable to d/c.   Difficult to place patient No       Level of care: ICU       MDM: The below labs and imaging reports were reviewed and summarized above.  Medication management as above.    DVT prophylaxis:  Lovenox  Code Status: FULL Family Communication: [place], Daughter and [name], Niece             Subjective: No fever.  No respiratory distress today, no diarrhea, no bleeding, no improvement in mentation.  Objective: Vitals:   01/03/21 1400 01/03/21 1500 01/03/21 1515 01/03/21 1600  BP: 129/64 (!) 124/55    Pulse: 94 (!) 105    Resp:      Temp:    97.8 F (36.6 C)  TempSrc:    Oral  SpO2: 99% 99% 100%   Weight:      Height:        Intake/Output Summary (Last 24 hours) at 01/03/2021 1724 Last data filed at 01/03/2021 0700 Gross per 24 hour  Intake 2480 ml  Output 550 ml  Net 1930 ml   Filed Weights   12/31/20 0500 01/02/21 0425 01/03/21 0500  Weight: 90.2 kg 90.8 kg 91.3 kg    Examination: General appearance:  adult female, comatose, in bed, with tracheostomy in place, feeding tube in place  HEENT: Anicteric, conjunctiva pink, lids and lashes normal. No nasal deformity, discharge, epistaxis.  Lips dry, does not open mouth.   Skin: Warm and dry.  No jaundice.  No suspicious rashes or lesions. Cardiac: RRR, nl S1-S2, no murmurs appreciated.  No LE edema.  Radial  pulses 2+ and symmetric. Respiratory: Normal respiratory rate and rhythm.  Soft expiratory wheezes, no rales. Abdomen: Abdomen soft.  No grimace to palpation.     MSK: Contractures in all four  extremities Neuro: Eyes open, blinking.  Does not withdraw from painful stimuli.  No tonic clonic jerking.  No purposeful movements, no spontaneous verbalizations.  Blink reflex intact.  Psych: Unresponsive.    Data Reviewed: I have personally reviewed following labs and imaging studies:  CBC: No results for input(s): WBC, NEUTROABS, HGB, HCT, MCV, PLT in the last 168 hours. Basic Metabolic Panel: No results for input(s): NA, K, CL, CO2, GLUCOSE, BUN, CREATININE, CALCIUM, MG, PHOS in the last 168 hours. GFR: Estimated Creatinine Clearance: 72.7 mL/min (A) (by C-G formula based on SCr of 0.37 mg/dL (L)). Liver Function Tests: No results for input(s): AST, ALT, ALKPHOS, BILITOT, PROT, ALBUMIN in the last 168 hours. No results for input(s): LIPASE, AMYLASE in the last 168 hours. No results for input(s): AMMONIA in the last 168 hours. Coagulation Profile: No results for input(s): INR, PROTIME in the last 168 hours. Cardiac Enzymes: No results for input(s): CKTOTAL, CKMB, CKMBINDEX, TROPONINI in the last 168 hours. BNP (last 3 results) No results for input(s): PROBNP in the last 8760 hours. HbA1C: No results for input(s): HGBA1C in the last 72 hours. CBG: Recent Labs  Lab 01/02/21 1539 01/02/21 1943 01/02/21 2337 01/03/21 0327 01/03/21 1606  GLUCAP 97 122* 134* 115* 128*   Lipid Profile: No results for input(s): CHOL, HDL, LDLCALC, TRIG, CHOLHDL, LDLDIRECT in the last 72 hours. Thyroid Function Tests: No results for input(s): TSH, T4TOTAL, FREET4, T3FREE, THYROIDAB in the last 72 hours. Anemia Panel: No results for input(s): VITAMINB12, FOLATE, FERRITIN, TIBC, IRON, RETICCTPCT in the last 72 hours. Urine analysis:    Component Value Date/Time   COLORURINE YELLOW 12/13/2020 1730   APPEARANCEUR CLEAR 12/13/2020 1730   LABSPEC 1.010 12/13/2020 1730   PHURINE 8.0 12/13/2020 1730   GLUCOSEU NEGATIVE 12/13/2020 1730   HGBUR NEGATIVE 12/13/2020 1730   BILIRUBINUR NEGATIVE  12/13/2020 1730   KETONESUR NEGATIVE 12/13/2020 1730   PROTEINUR NEGATIVE 12/13/2020 1730   UROBILINOGEN 0.2 07/03/2014 1456   NITRITE NEGATIVE 12/13/2020 1730   LEUKOCYTESUR NEGATIVE 12/13/2020 1730   Sepsis Labs: @LABRCNTIP (procalcitonin:4,lacticacidven:4)  )No results found for this or any previous visit (from the past 240 hour(s)).       Radiology Studies: No results found.      Scheduled Meds:  arformoterol  15 mcg Nebulization BID   baclofen  5 mg Per Tube TID   budesonide  0.5 mg Nebulization BID   chlorhexidine gluconate (MEDLINE KIT)  15 mL Mouth Rinse BID   Chlorhexidine Gluconate Cloth  6 each Topical Q2000   clonazePAM  1 mg Per Tube BID   clopidogrel  75 mg Per Tube Daily   enoxaparin (LOVENOX) injection  0.5 mg/kg Subcutaneous Q24H   famotidine  20 mg Per Tube BID   feeding supplement (PROSource TF)  45 mL Per Tube BID   fiber  1 packet Per  Tube BID   free water  200 mL Per Tube Q4H   Gerhardt's butt cream   Topical BID   guaiFENesin  5 mL Per Tube Q6H   hydrocortisone cream   Topical TID   insulin aspart  0-15 Units Subcutaneous Q4H   insulin glargine-yfgn  10 Units Subcutaneous QHS   levETIRAcetam  2,500 mg Per Tube BID   loratadine  10 mg Per Tube Daily   mouth rinse  15 mL Mouth Rinse 10 times per day   montelukast  10 mg Per Tube Daily   revefenacin  175 mcg Nebulization Daily   tiZANidine  2 mg Per Tube QHS   valproic acid  1,000 mg Per Tube Q12H   Continuous Infusions:  sodium chloride Stopped (01/01/21 2232)   feeding supplement (JEVITY 1.5 CAL/FIBER) 1,000 mL (01/01/21 1801)     LOS: 94 days    Time spent: 25 minutes    [name], MD [facility] 01/03/2021, 5:24 PM     Please page though Opt[employer] or Epic secure chat:  For [employer], Adult nurse

## 2021-01-04 DIAGNOSIS — J9621 Acute and chronic respiratory failure with hypoxia: Secondary | ICD-10-CM | POA: Diagnosis not present

## 2021-01-04 DIAGNOSIS — J9601 Acute respiratory failure with hypoxia: Secondary | ICD-10-CM | POA: Diagnosis not present

## 2021-01-04 DIAGNOSIS — A419 Sepsis, unspecified organism: Secondary | ICD-10-CM | POA: Diagnosis not present

## 2021-01-04 LAB — GLUCOSE, CAPILLARY
Glucose-Capillary: 110 mg/dL — ABNORMAL HIGH (ref 70–99)
Glucose-Capillary: 119 mg/dL — ABNORMAL HIGH (ref 70–99)
Glucose-Capillary: 123 mg/dL — ABNORMAL HIGH (ref 70–99)
Glucose-Capillary: 131 mg/dL — ABNORMAL HIGH (ref 70–99)
Glucose-Capillary: 142 mg/dL — ABNORMAL HIGH (ref 70–99)
Glucose-Capillary: 170 mg/dL — ABNORMAL HIGH (ref 70–99)

## 2021-01-04 NOTE — Progress Notes (Signed)
Spoke with daughter and POA Sonia Drake and niece Sonia Drake yesterday and again today.  In our discussion yesterday, I asked that Sonia Drake, and any other family members whose input was desired by Sonia Drake and Sonia Drake arrange to meet with me, other members of the medical staff, and Ethics to discuss Sonia Drake's clinical history and current state.  It is my belief that Sonia Drake and Sonia Nam do not perceive the respiratory/pulmonary deterioration that has occurred in the last 4 months, that they do not have a reasonable understanding of her neurological prognosis, and that they do not perceive the harm caused by current treatment.    Sonia Drake and Sonia Drake have been readily available to speak three times that I have called them in the last two days. Given the increasingly frequent respiratory distress caused by progressively worsening airway instability, I feel that this meeting has urgency.  Yesterday in our discussion, Sonia Drake and Sonia Drake indicated that they had obligations to settle Sonia Drake's possessions before Monday Sep 19, and they could not meet in person before that.  I offered various alternative dates and times, in collaborative effort to work around Sonia Drake's medical appointment Tuesday and both family members' after school childcare responsibilities, but they avoided offering viable specific dates and times.  They did both affirm they could meet in person Thursday Sep 22.  Today when I spoke to them again, they reported that they could not be present Thu Sep 22 because of a doctors appointment at the same time as our proposed meeting which had been scheduled 9 months ago.  I asked more than once for alternative dates/times next week and they avoided the question, offered no alternative schedule.  Given the harm of continued treatment to the patient Sonia Drake, the proximity and ready availability by phone of both POAs, I have set a reasonable deadline for meeting by  next Thursday Sep 22 at 11am.

## 2021-01-04 NOTE — Progress Notes (Addendum)
CSW spoke with Dr. Maryfrances Bunnell to discuss patient - MD requesting CSW contact LTACH liasions for an additional clinical review. MD states the family meeting is scheduled for 01/11/2021.  CSW spoke with Victorino Dike at [employer] who states that per the documentation, the patient does not have any potential for critical care recovery. Victorino Dike to speak with [employer] administration and relay contact information for Dr. Maryfrances Bunnell for further discussion.  CSW spoke with Irving Burton at [facility] who states that per her most recent review of the patient's clinicals, there has been no significant improvement or indication of possible recovery - which would disqualify patient for being a candidate for LTACH.  [name], MSW, LCSW Transitions of Care  Clinical Social Worker II [phone]

## 2021-01-04 NOTE — Progress Notes (Signed)
[facility] PROGRESS NOTE    Sonia Drake  [id number]:[id number] DOB: 10-08-1955 DOA: 10/15/2020 PCP: Garwin Brothers, MD      Brief Narrative:  [name] is a 65 y.o. 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  Subsequent hospital stay has been fairly uncomplicated, although the patient has remained ventilator dependent, has dynamic airway collapse leading to occasional unpredictable episodes of increased respiratory effort and ventilator-respiration dyssynchrony, has occasional myoclonic seizures, and has had no appreciable neurological improvement.         Assessment & Plan:  Chronic respiratory failure due to anoxic brain injury, tracheostomy depdendence, dynamic airway collapse (due to tracheobronchomalacia) and advanced COPD In the last 4 months, the patient has progressed from tolerating short periods of liberation from the vent, to having airway collapse and respiratory distress every few days despite full mechanical ventilation. 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  Neurology recommend minimizing AEDs, in order to avoid sedation per patients wishes but also because it will lead to no improvement in her seizure frequency  or functional status - Continue Keppra, Klonopin, Depakote   Type 2 diabetes Glucose normal - Continue SS corrections   Hypertension Blood pressure soft   Advanced COPD FEV1 2019 was 38% Mild wheezing, SpO2 normal, no evidence of flare - Continue LABA/LAMA/ICS - CONtinue Singulair, Claritin, famotidine   Cerebrovascular disease, secondary prevention - Continue Plavix   Obesity BMI 36            Disposition: Status is: Inpatient  Remains inpatient appropriate because:Unsafe d/c plan  Dispo: The patient is from: SNF              Anticipated d/c is to: LTAC              Patient currently is medically stable to d/c.   Difficult to place patient No       Level of care: ICU       MDM: The below labs and imaging reports were reviewed and summarized above.  Medication management as above.    DVT prophylaxis:  Lovenox  Code Status: FULL Family Communication: [place], Daughter and [name], Niece by phone, see separate documentation           Subjective: No new fever, diarrhea, bleeding.  No change in mentation    Objective: Vitals:   01/04/21 0600 01/04/21 0728 01/04/21 1055 01/04/21 1100  BP: (!) 142/71     Pulse: (!) 103     Resp: 20     Temp:  98.3 F (36.8 C)  98.3 F (36.8 C)  TempSrc:  Axillary  Axillary  SpO2: 100% 100% 100%   Weight:      Height:        Intake/Output Summary (Last 24 hours)  at 01/04/2021 1337 Last data filed at 01/04/2021 0500 Gross per 24 hour  Intake 720 ml  Output 1500 ml  Net -780 ml   Filed Weights   12/31/20 0500 01/02/21 0425 01/03/21 0500  Weight: 90.2 kg 90.8 kg 91.3 kg    Examination: General appearance:  adult female, comatose, in bed, with tracheostomy in place, feeding tube in place  HEENT: Eyes closed, lids and lashes normal. No nasal deformity, discharge, epistaxis.  Lips dry, does not open mouth.   Skin: Warm and dry.  No jaundice.  No suspicious rashes or lesions. Cardiac: RRR, nl  S1-S2, no murmurs appreciated.  No LE edema.  Radial  pulses 2+ and symmetric. Respiratory: Normal respiratory rate and rhythm.  No wheezing, no rales. Abdomen: Abdomen soft.  No grimace to palpation.     MSK: Contractures in all four extremities Neuro: Eyes closed, subtle blink to loud voice.  Does not withdraw from painful stimuli, does not open eyes to TMJ pressure, no purposeful movements, no spontaneous verbalizations, no tonic-clonic jerking.  Psych: Unresponsive.    Data Reviewed: I have personally reviewed following labs and imaging studies:  CBC: No results for input(s): WBC, NEUTROABS, HGB, HCT, MCV, PLT in the last 168 hours. Basic Metabolic Panel: No results for input(s): NA, K, CL, CO2, GLUCOSE, BUN, CREATININE, CALCIUM, MG, PHOS in the last 168 hours. GFR: Estimated Creatinine Clearance: 72.7 mL/min (A) (by C-G formula based on SCr of 0.37 mg/dL (L)). Liver Function Tests: No results for input(s): AST, ALT, ALKPHOS, BILITOT, PROT, ALBUMIN in the last 168 hours. No results for input(s): LIPASE, AMYLASE in the last 168 hours. No results for input(s): AMMONIA in the last 168 hours. Coagulation Profile: No results for input(s): INR, PROTIME in the last 168 hours. Cardiac Enzymes: No results for input(s): CKTOTAL, CKMB, CKMBINDEX, TROPONINI in the last 168 hours. BNP (last 3 results) No results for input(s): PROBNP in the last 8760 hours. HbA1C: No results for input(s): HGBA1C in the last 72 hours. CBG: Recent Labs  Lab 01/03/21 1928 01/03/21 2343 01/04/21 0401 01/04/21 0740 01/04/21 1129  GLUCAP 168* 155* 119* 131* 110*   Lipid Profile: No results for input(s): CHOL, HDL, LDLCALC, TRIG, CHOLHDL, LDLDIRECT in the last 72 hours. Thyroid Function Tests: No results for input(s): TSH, T4TOTAL, FREET4, T3FREE, THYROIDAB in the last 72 hours. Anemia Panel: No results for input(s): VITAMINB12, FOLATE, FERRITIN, TIBC, IRON, RETICCTPCT in the last 72 hours. Urine  analysis:    Component Value Date/Time   COLORURINE YELLOW 12/13/2020 1730   APPEARANCEUR CLEAR 12/13/2020 1730   LABSPEC 1.010 12/13/2020 1730   PHURINE 8.0 12/13/2020 1730   GLUCOSEU NEGATIVE 12/13/2020 1730   HGBUR NEGATIVE 12/13/2020 1730   BILIRUBINUR NEGATIVE 12/13/2020 1730   KETONESUR NEGATIVE 12/13/2020 1730   PROTEINUR NEGATIVE 12/13/2020 1730   UROBILINOGEN 0.2 07/03/2014 1456   NITRITE NEGATIVE 12/13/2020 1730   LEUKOCYTESUR NEGATIVE 12/13/2020 1730   Sepsis Labs: @LABRCNTIP (procalcitonin:4,lacticacidven:4)  )No results found for this or any previous visit (from the past 240 hour(s)).       Radiology Studies: No results found.      Scheduled Meds:  arformoterol  15 mcg Nebulization BID   baclofen  5 mg Per Tube TID   budesonide  0.5 mg Nebulization BID   chlorhexidine gluconate (MEDLINE KIT)  15 mL Mouth Rinse BID   Chlorhexidine Gluconate Cloth  6 each Topical Q2000   clonazePAM  1 mg Per Tube BID   clopidogrel  75 mg Per  Tube Daily   enoxaparin (LOVENOX) injection  0.5 mg/kg Subcutaneous Q24H   famotidine  20 mg Per Tube BID   feeding supplement (PROSource TF)  45 mL Per Tube BID   fiber  1 packet Per Tube BID   free water  200 mL Per Tube Q4H   Gerhardt's butt cream   Topical BID   guaiFENesin  5 mL Per Tube Q6H   hydrocortisone cream   Topical TID   insulin aspart  0-15 Units Subcutaneous Q4H   insulin glargine-yfgn  10 Units Subcutaneous QHS   levETIRAcetam  2,500 mg Per Tube BID   loratadine  10 mg Per Tube Daily   mouth rinse  15 mL Mouth Rinse 10 times per day   montelukast  10 mg Per Tube Daily   revefenacin  175 mcg Nebulization Daily   tiZANidine  2 mg Per Tube QHS   valproic acid  1,000 mg Per Tube Q12H   Continuous Infusions:  sodium chloride Stopped (01/01/21 2232)   feeding supplement (JEVITY 1.5 CAL/FIBER) 1,000 mL (01/04/21 0130)     LOS: 95 days    Time spent: 25 minutes    [name], MD [facility] 01/04/2021, 1:37 PM     Please page though [place] or Epic secure chat:  For [employer], Adult nurse

## 2021-01-05 DIAGNOSIS — J9601 Acute respiratory failure with hypoxia: Secondary | ICD-10-CM | POA: Diagnosis not present

## 2021-01-05 DIAGNOSIS — A419 Sepsis, unspecified organism: Secondary | ICD-10-CM | POA: Diagnosis not present

## 2021-01-05 DIAGNOSIS — J9621 Acute and chronic respiratory failure with hypoxia: Secondary | ICD-10-CM | POA: Diagnosis not present

## 2021-01-05 LAB — BASIC METABOLIC PANEL
Anion gap: 9 (ref 5–15)
BUN: 15 mg/dL (ref 8–23)
CO2: 28 mmol/L (ref 22–32)
Calcium: 10.5 mg/dL — ABNORMAL HIGH (ref 8.9–10.3)
Chloride: 101 mmol/L (ref 98–111)
Creatinine, Ser: 0.42 mg/dL — ABNORMAL LOW (ref 0.44–1.00)
GFR, Estimated: >60 mL/min (ref >60)
Glucose, Bld: 125 mg/dL — ABNORMAL HIGH (ref 70–99)
Potassium: 4.2 mmol/L (ref 3.5–5.1)
Sodium: 138 mmol/L (ref 135–145)

## 2021-01-05 LAB — GLUCOSE, CAPILLARY
Glucose-Capillary: 109 mg/dL — ABNORMAL HIGH (ref 70–99)
Glucose-Capillary: 110 mg/dL — ABNORMAL HIGH (ref 70–99)
Glucose-Capillary: 113 mg/dL — ABNORMAL HIGH (ref 70–99)
Glucose-Capillary: 123 mg/dL — ABNORMAL HIGH (ref 70–99)
Glucose-Capillary: 130 mg/dL — ABNORMAL HIGH (ref 70–99)
Glucose-Capillary: 161 mg/dL — ABNORMAL HIGH (ref 70–99)

## 2021-01-05 LAB — CBC
HCT: 35.8 % — ABNORMAL LOW (ref 36.0–46.0)
Hemoglobin: 11.3 g/dL — ABNORMAL LOW (ref 12.0–15.0)
MCH: 27.6 pg (ref 26.0–34.0)
MCHC: 31.6 g/dL (ref 30.0–36.0)
MCV: 87.3 fL (ref 80.0–100.0)
Platelets: 364 10*3/uL (ref 150–400)
RBC: 4.1 MIL/uL (ref 3.87–5.11)
RDW: 15.8 % — ABNORMAL HIGH (ref 11.5–15.5)
WBC: 7.6 10*3/uL (ref 4.0–10.5)
nRBC: 0 % (ref 0.0–0.2)

## 2021-01-05 NOTE — Progress Notes (Signed)
[facility] PROGRESS NOTE    NAHJAE HOEG  PZW:[id number] DOB: 15-Dec-1955 DOA: 09/24/2020 PCP: Garwin Brothers, MD      Brief Narrative:  Sonia Drake is a 65 y.o. 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  Subsequent hospital stay has been fairly uncomplicated, although the patient has remained ventilator dependent, has dynamic airway collapse leading to occasional unpredictable episodes of increased respiratory effort and ventilator-respiration dyssynchrony, has occasional myoclonic seizures, and has had no appreciable neurological improvement.         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 There is unanimous estimate that the patient will not have neurological recovery from her current state - Continue baclofen, low dose tizanidine - Continue tube feeds     Tachycardia - Check CBC, BMP  Seizures disorder History of epilepsy prior to anoxic brain injury, worse since PEA arrest - Continue Keppra, Klonopin, Depakote   Type 2 diabetes - Continue SS corrections   Hypertension Blood pressure soft   Advanced Asthma FEV1 2019 was 38% Mild wheezing, SpO2 normal, no evidence of flare - Continue LABA/LAMA/ICS - Continue Singulair, Claritin, famotidine   Cerebrovascular disease, secondary prevention - Continue Plavix   Obesity BMI 36            Disposition: Status is: Inpatient  Remains inpatient appropriate because:Unsafe d/c plan  Dispo:  The patient is from: SNF              Anticipated d/c is to: LTAC              Patient currently is medically stable to d/c.   Difficult to place patient No       Level of care: ICU       MDM: The below labs and imaging reports were reviewed and summarized above.  Medication management as above.    DVT prophylaxis:  Lovenox  Code Status: FULL Family Communication: Called to daughter, no answer, mailbox full           Subjective: No new respiratory distress, fever, bleeding.  No change in mentation.    Objective: Vitals:   01/05/21 1300 01/05/21 1400 01/05/21 1430 01/05/21 1434  BP: (!) 137/100 132/89 (!) 159/109   Pulse: (!) 105 96  (!) 107  Resp: (!) 25 (!) 21  (!) 26  Temp:      TempSrc:      SpO2: 99% 99%  99%  Weight:      Height:        Intake/Output Summary (Last 24 hours) at 01/05/2021 1538 Last data filed at 01/05/2021 1457 Gross per 24 hour  Intake 1405 ml  Output 1230 ml  Net 175 ml   Filed Weights   01/02/21 0425 01/03/21 0500 01/05/21 0500  Weight: 90.8 kg 91.3 kg 74.9 kg    Examination: General appearance: Adult female, lying in bed, comatose, tracheostomy, feeding tube     HEENT:    Skin:  Cardiac: Tachycardic, regular, no murmurs, mild nonpitting edema in all 4 extremities  Respiratory: Normal respiratory rate and rhythm on the ventilator, tracheostomy in place, soft wheezing, no rales Abdomen: Abdomen without grimace to palpation, soft, no rigidity or rebound MSK:  Neuro: Eyes closed, does not make eye contact, no purposeful movements, no spontaneous verbalizations, no jerking, does not open eyes to TMJ pressure, does not withdraw from painful stimuli, gag reflex intact Psych: Unresponsive     Data Reviewed: I have personally reviewed following labs and imaging studies:  CBC: No results for input(s): WBC, NEUTROABS, HGB, HCT, MCV, PLT in the last 168 hours. Basic Metabolic Panel: No results for input(s): NA, K, CL, CO2,  GLUCOSE, BUN, CREATININE, CALCIUM, MG, PHOS in the last 168 hours. GFR: Estimated Creatinine Clearance: 65.5 mL/min (A) (by C-G formula based on SCr of 0.37 mg/dL (L)). Liver Function Tests: No results for input(s): AST, ALT, ALKPHOS, BILITOT, PROT, ALBUMIN in the last 168 hours. No results for input(s): LIPASE, AMYLASE in the last 168 hours. No results for input(s): AMMONIA in the last 168 hours. Coagulation Profile: No results for input(s): INR, PROTIME in the last 168 hours. Cardiac Enzymes: No results for input(s): CKTOTAL, CKMB, CKMBINDEX, TROPONINI in the last 168 hours. BNP (last 3 results) No results for input(s): PROBNP in the last 8760 hours. HbA1C: No results for input(s): HGBA1C in the last 72 hours. CBG: Recent Labs  Lab 01/04/21 1945 01/04/21 2339 01/05/21 0334 01/05/21 0801 01/05/21 1135  GLUCAP 170* 142* 110* 130* 123*   Lipid Profile: No results for input(s): CHOL, HDL, LDLCALC, TRIG, CHOLHDL, LDLDIRECT in the last 72 hours. Thyroid Function Tests: No results for input(s): TSH, T4TOTAL, FREET4, T3FREE, THYROIDAB in the last 72 hours. Anemia Panel: No results for input(s): VITAMINB12, FOLATE, FERRITIN, TIBC, IRON, RETICCTPCT in the last 72 hours. Urine analysis:    Component Value Date/Time   COLORURINE YELLOW 12/13/2020 1730   APPEARANCEUR CLEAR 12/13/2020 1730   LABSPEC 1.010 12/13/2020 1730   PHURINE 8.0 12/13/2020 1730   GLUCOSEU NEGATIVE 12/13/2020 1730   HGBUR NEGATIVE 12/13/2020 1730   BILIRUBINUR NEGATIVE 12/13/2020 1730   KETONESUR NEGATIVE 12/13/2020 1730   PROTEINUR NEGATIVE 12/13/2020 1730   UROBILINOGEN 0.2 07/03/2014 1456   NITRITE NEGATIVE 12/13/2020 1730   LEUKOCYTESUR NEGATIVE 12/13/2020 1730   Sepsis Labs: @LABRCNTIP (procalcitonin:4,lacticacidven:4)  )No results found for this or any previous visit (from the past 240 hour(s)).       Radiology Studies: No results found.      Scheduled Meds:  arformoterol  15 mcg  Nebulization BID   baclofen  5 mg Per Tube TID   budesonide  0.5 mg Nebulization BID   chlorhexidine gluconate (MEDLINE KIT)  15 mL Mouth Rinse BID   Chlorhexidine Gluconate Cloth  6 each Topical Q2000   clonazePAM  1 mg Per Tube BID   clopidogrel  75 mg Per Tube Daily   enoxaparin (LOVENOX) injection  0.5 mg/kg Subcutaneous Q24H   famotidine  20 mg Per Tube BID   feeding supplement (PROSource TF)  45 mL Per Tube BID   fiber  1 packet Per Tube BID   free water  200 mL Per Tube Q4H   Gerhardt's butt cream   Topical BID   guaiFENesin  5 mL Per Tube Q6H   hydrocortisone cream   Topical TID   insulin aspart  0-15 Units Subcutaneous Q4H   insulin glargine-yfgn  10 Units Subcutaneous QHS   levETIRAcetam  2,500 mg Per Tube BID   loratadine  10 mg Per Tube Daily   mouth  rinse  15 mL Mouth Rinse 10 times per day   montelukast  10 mg Per Tube Daily   revefenacin  175 mcg Nebulization Daily   tiZANidine  2 mg Per Tube QHS   valproic acid  1,000 mg Per Tube Q12H   Continuous Infusions:  sodium chloride Stopped (01/01/21 2232)   feeding supplement (JEVITY 1.5 CAL/FIBER) 1,000 mL (01/04/21 0130)     LOS: 96 days    Time spent: 25 minutes    [name], MD [facility] 01/05/2021, 3:38 PM     Please page though [place] or Epic secure chat:  For [employer], Adult nurse

## 2021-01-06 DIAGNOSIS — J9621 Acute and chronic respiratory failure with hypoxia: Secondary | ICD-10-CM | POA: Diagnosis not present

## 2021-01-06 DIAGNOSIS — A419 Sepsis, unspecified organism: Secondary | ICD-10-CM | POA: Diagnosis not present

## 2021-01-06 DIAGNOSIS — J9601 Acute respiratory failure with hypoxia: Secondary | ICD-10-CM | POA: Diagnosis not present

## 2021-01-06 LAB — GLUCOSE, CAPILLARY
Glucose-Capillary: 100 mg/dL — ABNORMAL HIGH (ref 70–99)
Glucose-Capillary: 139 mg/dL — ABNORMAL HIGH (ref 70–99)
Glucose-Capillary: 118 mg/dL — ABNORMAL HIGH (ref 70–99)
Glucose-Capillary: 144 mg/dL — ABNORMAL HIGH (ref 70–99)
Glucose-Capillary: 157 mg/dL — ABNORMAL HIGH (ref 70–99)
Glucose-Capillary: 139 mg/dL — ABNORMAL HIGH (ref 70–99)

## 2021-01-06 NOTE — Progress Notes (Signed)
[facility] PROGRESS NOTE    Sonia Drake  PFY:[id number] DOB: [birth year] DOA: 10/07/2020 PCP: Sonia Brothers, MD      Brief Narrative:  Sonia Drake is a 65 y.o. 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   Subsequent hospital stay has been fairly uncomplicated, although the patient has remained ventilator dependent, has dynamic airway collapse leading to occasional unpredictable episodes of increased respiratory effort and ventilator-respiration dyssynchrony, has occasional myoclonic seizures, and has had no appreciable neurological improvement.       Assessment & Plan:  Chronic respiratory failure Dynamic airway collapse and tracheobronchomalacia Severe persistent asthma -Continue full mechanical ventilation - Continue Brovana and Pulmicort, revefenacin, Singulair, Claritin, famotidine  Persistent vegetative state Anoxic brain injury - Continue baclofen, low-dose nizatidine - Continue tube feeds  Sinus tachycardia Doubt infection CBC and BMP unremarkable.  Seizure disorder - Continue Keppra, Klonopin, Depakote  Diabetes Glucose normal - Continue sliding scale corrections   History of stroke - Continue Plavix  Obesity BMI 36            Disposition: Status is: Inpatient  Remains inpatient appropriate because:Unsafe d/c plan  Dispo: The patient is from: SNF              Anticipated d/c is to:  To be determined               Level of care: ICU       MDM: The below labs and imaging reports were reviewed and summarized above.  Medication management as above.         Subjective: No new fever, respiratory distress.  She is a little scab on her left wrist bleeding, but easily stopped with a cotton swab, no  change in mentation, no diarrhea.  Objective: Vitals:   01/06/21 1130 01/06/21 1159 01/06/21 1200 01/06/21 1230  BP: (!) 154/113  (!) 143/105 (!) 140/103  Pulse: (!) 106  (!) 103 (!) 108  Resp: _0 Temp:  98.4 F (36.9 C)    TempSrc:  Axillary    SpO2: 99%  99% 99%  Weight:      Height:        Intake/Output Summary (Last 24 hours) at 01/06/2021 1350 Last data filed at 01/06/2021 1055 Gross per 24 hour  Intake 1295 ml  Output 2130 ml  Net -835 ml   Filed Weights   01/03/21 0500 01/05/21 0500 01/06/21 0356  Weight: 91.3 kg 74.9 kg 91.4 kg    Examination: General appearance:  adult female, intubated and nonresponsive HEENT: Anicteric, conjunctiva pink, lids and lashes normal. No nasal deformity, discharge, epistaxis.  Lips recently moist and, no bleeding, there is a cottonball on the left side of the mouth.   Skin:   Cardiac: Tachycardic, regular, no murmurs, mild nonpitting extremity edema   Respiratory: Intubated, via tracheostomy, full mechanical ventilation, soft wheezing bilaterally Abdomen: Abdomen soft.  No grimace to palpation, no rigidity or rebound  MSK:   Neuro: No purposeful movements, no response to painful stimuli, no spontaneous vocalizations.  No spontaneous movements.  The eyes are fixed, pupils are reactive, blinks to shout    Psych: Unresponsive    Data Reviewed: I have personally reviewed following labs and imaging studies:  CBC: Recent Labs  Lab  01/05/21 1637  WBC 7.6  HGB 11.3*  HCT 35.8*  MCV 87.3  PLT 213   Basic Metabolic Panel: Recent Labs  Lab 01/05/21 1637  NA 138  K 4.2  CL 101  CO2 28  GLUCOSE 125*  BUN 15  CREATININE 0.42*  CALCIUM 10.5*   GFR: Estimated Creatinine Clearance: 72.7 mL/min (A) (by C-G formula based on SCr of 0.42 mg/dL (L)). Liver Function Tests: No results for input(s): AST, ALT, ALKPHOS, BILITOT, PROT, ALBUMIN in the last 168 hours. No results for input(s): LIPASE, AMYLASE in the last 168  hours. No results for input(s): AMMONIA in the last 168 hours. Coagulation Profile: No results for input(s): INR, PROTIME in the last 168 hours. Cardiac Enzymes: No results for input(s): CKTOTAL, CKMB, CKMBINDEX, TROPONINI in the last 168 hours. BNP (last 3 results) No results for input(s): PROBNP in the last 8760 hours. HbA1C: No results for input(s): HGBA1C in the last 72 hours. CBG: Recent Labs  Lab 01/05/21 2000 01/05/21 2332 01/06/21 0320 01/06/21 0736 01/06/21 1147  GLUCAP 109* 161* 100* 139* 118*   Lipid Profile: No results for input(s): CHOL, HDL, LDLCALC, TRIG, CHOLHDL, LDLDIRECT in the last 72 hours. Thyroid Function Tests: No results for input(s): TSH, T4TOTAL, FREET4, T3FREE, THYROIDAB in the last 72 hours. Anemia Panel: No results for input(s): VITAMINB12, FOLATE, FERRITIN, TIBC, IRON, RETICCTPCT in the last 72 hours. Urine analysis:    Component Value Date/Time   COLORURINE YELLOW 12/13/2020 1730   APPEARANCEUR CLEAR 12/13/2020 1730   LABSPEC 1.010 12/13/2020 1730   PHURINE 8.0 12/13/2020 1730   GLUCOSEU NEGATIVE 12/13/2020 1730   HGBUR NEGATIVE 12/13/2020 1730   BILIRUBINUR NEGATIVE 12/13/2020 1730   KETONESUR NEGATIVE 12/13/2020 1730   PROTEINUR NEGATIVE 12/13/2020 1730   UROBILINOGEN 0.2 07/03/2014 1456   NITRITE NEGATIVE 12/13/2020 1730   LEUKOCYTESUR NEGATIVE 12/13/2020 1730   Sepsis Labs: _0 (procalcitonin:4,lacticacidven:4)  )No results found for this or any previous visit (from the past 240 hour(s)).       Radiology Studies: No results found.      Scheduled Meds:  arformoterol  15 mcg Nebulization BID   baclofen  5 mg Per Tube TID   budesonide  0.5 mg Nebulization BID   chlorhexidine gluconate (MEDLINE KIT)  15 mL Mouth Rinse BID   Chlorhexidine Gluconate Cloth  6 each Topical Q2000   clonazePAM  1 mg Per Tube BID   clopidogrel  75 mg Per Tube Daily   enoxaparin (LOVENOX) injection  0.5 mg/kg Subcutaneous Q24H    famotidine  20 mg Per Tube BID   feeding supplement (PROSource TF)  45 mL Per Tube BID   fiber  1 packet Per Tube BID   free water  200 mL Per Tube Q4H   Gerhardt's butt cream   Topical BID   guaiFENesin  5 mL Per Tube Q6H   hydrocortisone cream   Topical TID   insulin aspart  0-15 Units Subcutaneous Q4H   insulin glargine-yfgn  10 Units Subcutaneous QHS   levETIRAcetam  2,500 mg Per Tube BID   loratadine  10 mg Per Tube Daily   mouth rinse  15 mL Mouth Rinse 10 times per day   montelukast  10 mg Per Tube Daily   revefenacin  175 mcg Nebulization Daily   tiZANidine  2 mg Per Tube QHS   valproic acid  1,000 mg Per Tube Q12H   Continuous Infusions:  sodium chloride Stopped (01/01/21 2232)   feeding supplement (JEVITY 1.5 CAL/FIBER)  1,000 mL (01/06/21 1051)     LOS: 97 days    Time spent: 15 minutes    [name], MD [facility] 01/06/2021, 1:50 PM     Please page though [place] or Epic secure chat:  For [employer], Adult nurse

## 2021-01-07 DIAGNOSIS — J9621 Acute and chronic respiratory failure with hypoxia: Secondary | ICD-10-CM | POA: Diagnosis not present

## 2021-01-07 DIAGNOSIS — J9601 Acute respiratory failure with hypoxia: Secondary | ICD-10-CM | POA: Diagnosis not present

## 2021-01-07 DIAGNOSIS — A419 Sepsis, unspecified organism: Secondary | ICD-10-CM | POA: Diagnosis not present

## 2021-01-07 LAB — GLUCOSE, CAPILLARY
Glucose-Capillary: 120 mg/dL — ABNORMAL HIGH (ref 70–99)
Glucose-Capillary: 127 mg/dL — ABNORMAL HIGH (ref 70–99)
Glucose-Capillary: 129 mg/dL — ABNORMAL HIGH (ref 70–99)
Glucose-Capillary: 129 mg/dL — ABNORMAL HIGH (ref 70–99)
Glucose-Capillary: 135 mg/dL — ABNORMAL HIGH (ref 70–99)
Glucose-Capillary: 145 mg/dL — ABNORMAL HIGH (ref 70–99)

## 2021-01-07 MED ORDER — METOPROLOL TARTRATE 25 MG PO TABS
25.0000 mg | ORAL_TABLET | Freq: Two times a day (BID) | ORAL | Status: DC
  Administered 2021-01-07 – 2021-01-20 (×20): 25 mg
  Filled 2021-01-07 (×25): qty 1

## 2021-01-07 NOTE — Progress Notes (Signed)
[facility] PROGRESS NOTE    ANGELMARIE PONZO  WGN:[id number] DOB: [birth year] DOA: 10/05/2020 PCP: Garwin Brothers, MD      Brief Narrative:  [name] is a 65 y.o. 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   Subsequent hospital stay has been thankfully without serious drug resistant infection or cardiac arrest so far, but she remains ventilator dependent, has dynamic airway collapse leading to occasional unpredictable episodes of increased respiratory effort and ventilator-respiration dyssynchrony, has occasional myoclonic seizures, and has had no appreciable neurological improvement.       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 -Continue baclofen, low-dose tizanidine - Continue tube feeds  Sinus tachycardia Blood pressure up - Resume metoprolol 25 twice daily  Seizure disorder No seizures in the last day - Continue Keppra, Klonopin, Depakote  Type 2 diabetes Glucose controlled -Sliding scale correction   Cerebrovascular disease, secondary prevention - Continue Plavix  Obesity BMI 36            Disposition: Status is: Inpatient  Remains inpatient appropriate because:Unsafe d/c plan  Dispo: The patient is from: SNF              Anticipated d/c is to:  To be determined               Level of care: ICU       MDM: The below labs and imaging reports were reviewed and summarized above.  Medication management as above.         Subjective: No new fever,  respiratory distress, seizures, bleeding.  No change in mentation, no diarrhea blood pressures been elevated, urine output is normal  Objective: Vitals:   01/07/21 0750 01/07/21 0800 01/07/21 0900 01/07/21 1100  BP:  (!) 131/98 (!) 122/97   Pulse:  (!) 115 (!) 102   Resp:  (!) 23 16   Temp:      TempSrc:      SpO2: 100% 97% 100% 99%  Weight:      Height:        Intake/Output Summary (Last 24 hours) at 01/07/2021 1237 Last data filed at 01/07/2021 1000 Gross per 24 hour  Intake 1175 ml  Output 1100 ml  Net 75 ml   Filed Weights   01/05/21 0500 01/06/21 0356 01/07/21 0410  Weight: 74.9 kg 91.4 kg 92.3 kg    Examination: General appearance: Adult female, intubated and unresponsive     HEENT:    Skin:  Cardiac: Tachycardic, regular, no murmurs, diffuse nonpitting edema Respiratory: Intubated via tracheostomy, on full mechanical ventilation, no wheezing or rales Abdomen: Abdomen soft tenderness to palpation, rigidity, rebound MSK:  Neuro: No spontaneous movements, no spontaneous verbalizations, no response to painful stimuli. Psych: Unresponsive     Data Reviewed: I have personally reviewed following labs and imaging studies:  CBC: Recent Labs  Lab 01/05/21 1637  WBC 7.6  HGB 11.3*  HCT 35.8*  MCV 87.3  PLT 784   Basic Metabolic Panel: Recent Labs  Lab 01/05/21 1637  NA 138  K 4.2  CL 101  CO2 28  GLUCOSE  125*  BUN 15  CREATININE 0.42*  CALCIUM 10.5*   GFR: Estimated Creatinine Clearance: 73.2 mL/min (A) (by C-G formula based on SCr of 0.42 mg/dL (L)). Liver Function Tests: No results for input(s): AST, ALT, ALKPHOS, BILITOT, PROT, ALBUMIN in the last 168 hours. No results for input(s): LIPASE, AMYLASE in the last 168 hours. No results for input(s): AMMONIA in the last 168 hours. Coagulation Profile: No results for input(s): INR, PROTIME in the last 168 hours. Cardiac Enzymes: No results for input(s): CKTOTAL, CKMB, CKMBINDEX, TROPONINI in the last  168 hours. BNP (last 3 results) No results for input(s): PROBNP in the last 8760 hours. HbA1C: No results for input(s): HGBA1C in the last 72 hours. CBG: Recent Labs  Lab 01/06/21 1936 01/06/21 2313 01/07/21 0334 01/07/21 0727 01/07/21 1111  GLUCAP 157* 139* 135* 145* 129*   Lipid Profile: No results for input(s): CHOL, HDL, LDLCALC, TRIG, CHOLHDL, LDLDIRECT in the last 72 hours. Thyroid Function Tests: No results for input(s): TSH, T4TOTAL, FREET4, T3FREE, THYROIDAB in the last 72 hours. Anemia Panel: No results for input(s): VITAMINB12, FOLATE, FERRITIN, TIBC, IRON, RETICCTPCT in the last 72 hours. Urine analysis:    Component Value Date/Time   COLORURINE YELLOW 12/13/2020 1730   APPEARANCEUR CLEAR 12/13/2020 1730   LABSPEC 1.010 12/13/2020 1730   PHURINE 8.0 12/13/2020 1730   GLUCOSEU NEGATIVE 12/13/2020 1730   HGBUR NEGATIVE 12/13/2020 1730   BILIRUBINUR NEGATIVE 12/13/2020 1730   KETONESUR NEGATIVE 12/13/2020 1730   PROTEINUR NEGATIVE 12/13/2020 1730   UROBILINOGEN 0.2 07/03/2014 1456   NITRITE NEGATIVE 12/13/2020 1730   LEUKOCYTESUR NEGATIVE 12/13/2020 1730   Sepsis Labs: _0 (procalcitonin:4,lacticacidven:4)  )No results found for this or any previous visit (from the past 240 hour(s)).       Radiology Studies: No results found.      Scheduled Meds:  arformoterol  15 mcg Nebulization BID   baclofen  5 mg Per Tube TID   budesonide  0.5 mg Nebulization BID   chlorhexidine gluconate (MEDLINE KIT)  15 mL Mouth Rinse BID   Chlorhexidine Gluconate Cloth  6 each Topical Q2000   clonazePAM  1 mg Per Tube BID   clopidogrel  75 mg Per Tube Daily   enoxaparin (LOVENOX) injection  0.5 mg/kg Subcutaneous Q24H   famotidine  20 mg Per Tube BID   feeding supplement (PROSource TF)  45 mL Per Tube BID   fiber  1 packet Per Tube BID   free water  200 mL Per Tube Q4H   Gerhardt's butt cream   Topical BID   guaiFENesin  5 mL Per Tube Q6H   hydrocortisone  cream   Topical TID   insulin aspart  0-15 Units Subcutaneous Q4H   insulin glargine-yfgn  10 Units Subcutaneous QHS   levETIRAcetam  2,500 mg Per Tube BID   loratadine  10 mg Per Tube Daily   mouth rinse  15 mL Mouth Rinse 10 times per day   metoprolol tartrate  25 mg Per Tube BID   montelukast  10 mg Per Tube Daily   revefenacin  175 mcg Nebulization Daily   tiZANidine  2 mg Per Tube QHS   valproic acid  1,000 mg Per Tube Q12H   Continuous Infusions:  sodium chloride Stopped (01/01/21 2232)   feeding supplement (JEVITY 1.5 CAL/FIBER) 1,000 mL (01/06/21 1051)     LOS: 98 days    Time spent: 25 minutes    [name], MD [facility] 01/07/2021, 12:37 PM  Please page though [place] or Epic secure chat:  For [employer], Adult nurse

## 2021-01-08 DIAGNOSIS — J9601 Acute respiratory failure with hypoxia: Secondary | ICD-10-CM | POA: Diagnosis not present

## 2021-01-08 DIAGNOSIS — A419 Sepsis, unspecified organism: Secondary | ICD-10-CM | POA: Diagnosis not present

## 2021-01-08 DIAGNOSIS — J9621 Acute and chronic respiratory failure with hypoxia: Secondary | ICD-10-CM | POA: Diagnosis not present

## 2021-01-08 LAB — GLUCOSE, CAPILLARY
Glucose-Capillary: 112 mg/dL — ABNORMAL HIGH (ref 70–99)
Glucose-Capillary: 95 mg/dL (ref 70–99)
Glucose-Capillary: 107 mg/dL — ABNORMAL HIGH (ref 70–99)
Glucose-Capillary: 121 mg/dL — ABNORMAL HIGH (ref 70–99)
Glucose-Capillary: 124 mg/dL — ABNORMAL HIGH (ref 70–99)
Glucose-Capillary: 121 mg/dL — ABNORMAL HIGH (ref 70–99)

## 2021-01-08 NOTE — Progress Notes (Signed)
[facility] PROGRESS NOTE    Sonia Drake  OHY:[id number] DOB: 1955/05/10 DOA: 10/03/2020 PCP: Garwin Brothers, MD      Brief Narrative:  Sonia Drake is a 65 y.o. 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  Sonia Drake distress was ultimately found to be due to her baseline severe tracheobronchomalacia and asthma.         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    -Continue baclofen, low-dose tizanidine - Avoid sedatives - Continue tube feeds  Hypertension Blood pressure normal - Continue metoprolol 25 mg twice daily  Seizure disorder No generalized tonic-clonic seizures in the last day, she has some myoclonic face jerking today, consistent with her previous seizures - Continue Keppra, Klonopin, Depakote  Type 2 diabetes Glucoses are controlled - Continue sliding scale corrections  Cerebrovascular disease, secondary prevention - Continue Plavix  Obesity BMI 36       Disposition: Status is: Inpatient  Remains inpatient appropriate because:Unsafe d/c plan  Dispo: The patient is from: SNF       Level of care: ICU       MDM: The below labs and imaging reports were reviewed and summarized above.  Medication management as above.   Family Communication: Spoke to Sonia Drake today, arranged meeting for Wed Sep 21 at 11am, to include brother by [place].  Spoke to [name] briefly yesterday and day  before (Saturday), she was in midst of moving, and so only spoke briefly.           Subjective: Some myoclonic face jerking today.  Some moisture associated skin disruption on the buttocks which is very mild, treated with Gerhardt's.  No fever.  No respiratory distress.  No diarrhea.  Objective: Vitals:   01/08/21 0725 01/08/21 0820 01/08/21 0826 01/08/21 0829  BP:      Pulse:    75  Resp:    16  Temp: (!) 96.3 F (35.7 C)     TempSrc: Axillary     SpO2:  100% 100% 100%  Weight:      Height:        Intake/Output Summary (Last 24 hours) at 01/08/2021 0927 Last data filed at 01/08/2021 0600 Gross per 24 hour  Intake 680 ml  Output 1115 ml  Net -435 ml   Filed Weights   01/06/21 0356 01/07/21 0410 01/08/21 0500  Weight: 91.4 kg 92.3 kg 89.6 kg    Examination: General appearance:  adult female, intubated and sedated HEENT: Anicteric, conjunctiva pink, lids and lashes normal. No nasal deformity, discharge, epistaxis.  Lips cracked, moisturized, does not open the mouth.   Skin: Warm and dry.  no jaundice.  No suspicious rashes or lesions on face, neck, upper chest, arms, or legs. Cardiac: Mild tachycardia, regular, no murmurs, subtle nonpitting edema in all 4 extremities. Respiratory: Ventilated via tracheostomy, coarse breath sounds bilaterally, no wheezing. Abdomen: Abdomen soft.  No grimace to palpation, no ascites or distention Neuro: Face twitching occasionally, blinks eyes, stairs fixed, pupils are reactive, oculocephalic reflexes in tact, grimace to  loud noise, does not withdraw from pain, no spontaneous movements, no withdrawal from pain in all 4 extremities, no fasciculations, no spontaneous verbalizations.    Psych: Unresponsive.    Data Reviewed: I have personally reviewed following labs and imaging studies:  CBC: Recent Labs  Lab 01/05/21 1637  WBC 7.6  HGB 11.3*  HCT 35.8*  MCV 87.3  PLT 580   Basic Metabolic Panel: Recent Labs  Lab  01/05/21 1637  NA 138  K 4.2  CL 101  CO2 28  GLUCOSE 125*  BUN 15  CREATININE 0.42*  CALCIUM 10.5*   GFR: Estimated Creatinine Clearance: 72 mL/min (A) (by C-G formula based on SCr of 0.42 mg/dL (L)). Liver Function Tests: No results for input(s): AST, ALT, ALKPHOS, BILITOT, PROT, ALBUMIN in the last 168 hours. No results for input(s): LIPASE, AMYLASE in the last 168 hours. No results for input(s): AMMONIA in the last 168 hours. Coagulation Profile: No results for input(s): INR, PROTIME in the last 168 hours. Cardiac Enzymes: No results for input(s): CKTOTAL, CKMB, CKMBINDEX, TROPONINI in the last 168 hours. BNP (last 3 results) No results for input(s): PROBNP in the last 8760 hours. HbA1C: No results for input(s): HGBA1C in the last 72 hours. CBG: Recent Labs  Lab 01/07/21 1552 01/07/21 1954 01/07/21 2354 01/08/21 0334 01/08/21 0722  GLUCAP 120* 127* 129* 112* 95   Lipid Profile: No results for input(s): CHOL, HDL, LDLCALC, TRIG, CHOLHDL, LDLDIRECT in the last 72 hours. Thyroid Function Tests: No results for input(s): TSH, T4TOTAL, FREET4, T3FREE, THYROIDAB in the last 72 hours. Anemia Panel: No results for input(s): VITAMINB12, FOLATE, FERRITIN, TIBC, IRON, RETICCTPCT in the last 72 hours. Urine analysis:    Component Value Date/Time   COLORURINE YELLOW 12/13/2020 1730   APPEARANCEUR CLEAR 12/13/2020 1730   LABSPEC 1.010 12/13/2020 1730   PHURINE 8.0 12/13/2020 1730   GLUCOSEU NEGATIVE 12/13/2020 1730   HGBUR NEGATIVE 12/13/2020 1730   BILIRUBINUR NEGATIVE 12/13/2020 1730   KETONESUR NEGATIVE 12/13/2020 1730   PROTEINUR NEGATIVE 12/13/2020 1730   UROBILINOGEN 0.2 07/03/2014 1456   NITRITE NEGATIVE 12/13/2020 1730   LEUKOCYTESUR NEGATIVE 12/13/2020 1730   Sepsis Labs: _0 (procalcitonin:4,lacticacidven:4)  )No results found for this or any previous visit (from the past 240 hour(s)).       Radiology Studies: No results  found.      Scheduled Meds:  arformoterol  15 mcg Nebulization BID   baclofen  5 mg Per Tube TID   budesonide  0.5 mg Nebulization BID   chlorhexidine gluconate (MEDLINE KIT)  15 mL Mouth Rinse BID   Chlorhexidine Gluconate Cloth  6 each Topical Q2000   clonazePAM  1 mg Per Tube BID   clopidogrel  75 mg Per Tube Daily   enoxaparin (LOVENOX) injection  0.5 mg/kg Subcutaneous Q24H   famotidine  20 mg Per Tube BID   feeding supplement (PROSource TF)  45 mL Per Tube BID   fiber  1 packet Per Tube BID   free water  200 mL Per Tube Q4H   Gerhardt's butt cream   Topical BID   guaiFENesin  5 mL Per Tube Q6H   hydrocortisone cream   Topical TID   insulin aspart  0-15 Units Subcutaneous Q4H   insulin glargine-yfgn  10 Units Subcutaneous QHS   levETIRAcetam  2,500 mg Per Tube BID   loratadine  10 mg Per Tube Daily   mouth rinse  15 mL Mouth Rinse 10 times per day   metoprolol tartrate  25 mg Per Tube BID   montelukast  10 mg Per Tube Daily   revefenacin  175 mcg Nebulization Daily   tiZANidine  2 mg Per Tube QHS   valproic acid  1,000 mg Per Tube Q12H   Continuous Infusions:  sodium chloride Stopped (01/01/21 2232)   feeding supplement (JEVITY 1.5 CAL/FIBER) 1,000 mL (01/07/21 1324)     LOS: 99 days    Time spent: 25 minutes    [name], MD [facility] 01/08/2021, 9:27 AM     Please page though [place] or Epic secure chat:  For [employer], Adult nurse

## 2021-01-09 DIAGNOSIS — J9621 Acute and chronic respiratory failure with hypoxia: Secondary | ICD-10-CM | POA: Diagnosis not present

## 2021-01-09 DIAGNOSIS — J9601 Acute respiratory failure with hypoxia: Secondary | ICD-10-CM | POA: Diagnosis not present

## 2021-01-09 DIAGNOSIS — A419 Sepsis, unspecified organism: Secondary | ICD-10-CM | POA: Diagnosis not present

## 2021-01-09 LAB — GLUCOSE, CAPILLARY
Glucose-Capillary: 105 mg/dL — ABNORMAL HIGH (ref 70–99)
Glucose-Capillary: 126 mg/dL — ABNORMAL HIGH (ref 70–99)
Glucose-Capillary: 127 mg/dL — ABNORMAL HIGH (ref 70–99)
Glucose-Capillary: 132 mg/dL — ABNORMAL HIGH (ref 70–99)
Glucose-Capillary: 140 mg/dL — ABNORMAL HIGH (ref 70–99)
Glucose-Capillary: 87 mg/dL (ref 70–99)

## 2021-01-09 NOTE — Progress Notes (Signed)
Respiratory called.  Patient  alarming with volume delivery restrictions and low volumes.  Reading of volumes 388 and 392. Attempted to suction pt via trach without results.  Resp. To come see patient.

## 2021-01-09 NOTE — Progress Notes (Signed)
[facility] PROGRESS NOTE    CHRISTIANNE ZACHER  LSL:[id number] DOB: 07-Mar-1956 DOA: 09/23/2020 PCP: Garwin Brothers, MD      Brief Narrative:  Sonia Drake is a 65 y.o. 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  [name] distress was ultimately found to be due to her baseline severe tracheobronchomalacia and asthma.         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    -Continue baclofen, low-dose tizanidine - Avoid sedatives - Continue tube feeds  Hypertension Blood pressure low - Continue metoprolol 25 twice daily  Seizure disorder Some myoclonic jerks today - Continue Keppra, Klonopin, Depakote  Type 2 diabetes Glucoses are controlled - Continue sliding scale corrections  Cerebrovascular disease, secondary prevention - Continue Plavix  Obesity BMI 36       Disposition: Status is: Inpatient  Remains inpatient appropriate because:Unsafe d/c plan  Dispo: The patient is from: SNF       Level of care: ICU       MDM: The below labs and imaging reports were reviewed and summarized above.  Medication management as above.   Family Communication:            Subjective: More simple myoclonic jerks today.  No fever, no change breathing, no diarrhea.  No improvement in mentation.  Objective: Vitals:   01/09/21 1200 01/09/21 1300 01/09/21 1400 01/09/21 1513  BP: (!) 160/102 (!) 136/94 (!)  81/64   Pulse: 94 88 75   Resp: _0 Temp:    97.7 F (36.5 C)  TempSrc:    Axillary  SpO2: 99% 100% 100%   Weight:      Height:        Intake/Output Summary (Last 24 hours) at 01/09/2021 1529 Last data filed at 01/09/2021 1400 Gross per 24 hour  Intake 2780 ml  Output 1560 ml  Net 1220 ml   Filed Weights   01/06/21 0356 01/07/21 0410 01/08/21 0500  Weight: 91.4 kg 92.3 kg 89.6 kg    Examination: General appearance: Adult female, intubated and comatose HEENT:     Skin:   Cardiac: RRR, no murmurs, mild nonpitting extremity edmea Respiratory: Ventilated via tracheostomy, Lugn sounds clear without rales or wheezes Abdomen: No grimace to palpation, no rigidity, no masses Neuro: Does not withdraw from pain in hands bilaterally, does not withdraw appreciably from pain in toes bilaterally, occasional twitches, no spontaneous movements, no spontaneous verbalizations, no intelligible eye movements.    Psych: Unresponsive.    Data Reviewed: I have personally reviewed following labs and imaging studies:  CBC: Recent Labs  Lab 01/05/21 1637  WBC 7.6  HGB 11.3*  HCT 35.8*  MCV 87.3  PLT 115   Basic Metabolic Panel: Recent Labs  Lab 01/05/21 1637  NA 138  K 4.2  CL 101  CO2 28  GLUCOSE 125*  BUN 15  CREATININE 0.42*  CALCIUM 10.5*   GFR: Estimated Creatinine Clearance: 71 mL/min (A) (by C-G formula based on SCr of 0.42 mg/dL (L)). Liver Function Tests:  No results for input(s): AST, ALT, ALKPHOS, BILITOT, PROT, ALBUMIN in the last 168 hours. No results for input(s): LIPASE, AMYLASE in the last 168 hours. No results for input(s): AMMONIA in the last 168 hours. Coagulation Profile: No results for input(s): INR, PROTIME in the last 168 hours. Cardiac Enzymes: No results for input(s): CKTOTAL, CKMB, CKMBINDEX, TROPONINI in the last 168 hours. BNP (last 3 results) No results for input(s): PROBNP in the last 8760 hours. HbA1C: No results for input(s): HGBA1C in  the last 72 hours. CBG: Recent Labs  Lab 01/08/21 2317 01/09/21 0314 01/09/21 0717 01/09/21 1124 01/09/21 1512  GLUCAP 121* 87 132* 105* 126*   Lipid Profile: No results for input(s): CHOL, HDL, LDLCALC, TRIG, CHOLHDL, LDLDIRECT in the last 72 hours. Thyroid Function Tests: No results for input(s): TSH, T4TOTAL, FREET4, T3FREE, THYROIDAB in the last 72 hours. Anemia Panel: No results for input(s): VITAMINB12, FOLATE, FERRITIN, TIBC, IRON, RETICCTPCT in the last 72 hours. Urine analysis:    Component Value Date/Time   COLORURINE YELLOW 12/13/2020 1730   APPEARANCEUR CLEAR 12/13/2020 1730   LABSPEC 1.010 12/13/2020 1730   PHURINE 8.0 12/13/2020 1730   GLUCOSEU NEGATIVE 12/13/2020 1730   HGBUR NEGATIVE 12/13/2020 1730   BILIRUBINUR NEGATIVE 12/13/2020 1730   KETONESUR NEGATIVE 12/13/2020 1730   PROTEINUR NEGATIVE 12/13/2020 1730   UROBILINOGEN 0.2 07/03/2014 1456   NITRITE NEGATIVE 12/13/2020 1730   LEUKOCYTESUR NEGATIVE 12/13/2020 1730   Sepsis Labs: _0 (procalcitonin:4,lacticacidven:4)  )No results found for this or any previous visit (from the past 240 hour(s)).       Radiology Studies: No results found.      Scheduled Meds:  arformoterol  15 mcg Nebulization BID   baclofen  5 mg Per Tube TID   budesonide  0.5 mg Nebulization BID   chlorhexidine gluconate (MEDLINE KIT)  15 mL Mouth Rinse BID   Chlorhexidine Gluconate Cloth  6 each Topical Q2000   clonazePAM  1 mg Per Tube BID   clopidogrel  75 mg Per Tube Daily   enoxaparin (LOVENOX) injection  0.5 mg/kg Subcutaneous Q24H   famotidine  20 mg Per Tube BID   feeding supplement (PROSource TF)  45 mL Per Tube BID   fiber  1 packet Per Tube BID   free water  200 mL Per Tube Q4H   Gerhardt's butt cream   Topical BID   guaiFENesin  5 mL Per Tube Q6H   hydrocortisone cream   Topical TID   insulin aspart  0-15 Units Subcutaneous Q4H   insulin glargine-yfgn  10 Units Subcutaneous QHS   levETIRAcetam   2,500 mg Per Tube BID   loratadine  10 mg Per Tube Daily   mouth rinse  15 mL Mouth Rinse 10 times per day   metoprolol tartrate  25 mg Per Tube BID   montelukast  10 mg Per Tube Daily   revefenacin  175 mcg Nebulization Daily   tiZANidine  2 mg Per Tube QHS   valproic acid  1,000 mg Per Tube Q12H   Continuous Infusions:  sodium chloride Stopped (01/01/21 2232)   feeding supplement (JEVITY 1.5 CAL/FIBER) 1,000 mL (01/08/21 1600)     LOS: 100 days    Time spent: 25 minutes    [name], MD [facility] 01/09/2021, 3:29 PM     Please page though [place] or Epic secure chat:  For [employer], Adult nurse

## 2021-01-09 NOTE — Progress Notes (Signed)
Nutrition Follow-up  DOCUMENTATION CODES:   Not applicable  INTERVENTION:   Continue tube feeds via PEG: - Jevity 1.5 @ 45 ml/hr (1080 ml/day) - ProSource TF 45 ml BID - Free water flushes of 200 ml q 4 hours   Tube feeding regimen provides 1700 kcal, 91 grams of protein, and 821 ml of H2O.   Total free water with flushes: 2280 ml  NUTRITION DIAGNOSIS:   Increased nutrient needs related to wound healing as evidenced by estimated needs.  Ongoing, being addressed via TF  GOAL:   Patient will meet greater than or equal to 90% of their needs  Met via TF  MONITOR:   Vent status, Labs, Weight trends, TF tolerance, Skin, I & O's  REASON FOR ASSESSMENT:   Consult Enteral/tube feeding initiation and management  ASSESSMENT:   Sonia Drake is a 65 y.o. female with medical history significant of asthma/COPD, CHF, cardiac arrest with PEA/CPR with chronic respiratory failure on permanent trach with supplemental oxygen at 6L, type 2 DM, GERD, seizures and severe hypoxic-ischemic encephalopathy who presented to ER via EMS for respiratory distress from SNF.  Discussed pt with RN and during ICU rounds. Plan is for [place] meeting with several family members tomorrow. Pt tolerating current TF without issue. Will continue with current TF regimen.  Current TF: Jevity 1.5 @ 45 ml/hr, ProSource TF 45 ml BID, free water flushes 200 ml q 4 hours  Admit weight: 88.4 kg Current weight: 89.6 kg  Pt continues to have mild pitting edema to BUE and BLE.  Patient remains on ventilator support via trach MV: 6.3 L/min Temp (24hrs), Avg:98 F (36.7 C), Min:97.6 F (36.4 C), Max:98.4 F (36.9 C)  Medications reviewed and include: pepcid, nutrisource fiber BID, SSI q 4 hours, semglee 10 units daily  Labs reviewed. CBG's: 87-132 x 24 hours  UOP: 3010 ml x 24 hours I/O's: +32.9 L since admit  Diet Order:   Diet Order             Diet NPO time specified  Diet effective now                    EDUCATION NEEDS:   No education needs have been identified at this time  Skin:  Skin Assessment: Skin Integrity Issues: Other: MASD to buttocks  Last BM:  01/08/21 small type 5  Height:   Ht Readings from Last 1 Encounters:  12/25/20 _0  (1.575 m)    Weight:   Wt Readings from Last 1 Encounters:  01/08/21 89.6 kg    Ideal Body Weight:  45.5 kg  BMI:  Body mass index is 36.13 kg/m.  Estimated Nutritional Needs:   Kcal:  1600-1800  Protein:  90-105 grams  Fluid:  > 1.6 L    Gustavus Bryant, MS, RD, LDN Inpatient Clinical Dietitian Please see [facility] for contact information.

## 2021-01-09 NOTE — Progress Notes (Signed)
Patient experiencing myoclonic jerking every 30 seconds-1 minute. Ativan given at this time. Vital signs stable. Will continue to monitor.

## 2021-01-10 DIAGNOSIS — J398 Other specified diseases of upper respiratory tract: Secondary | ICD-10-CM | POA: Diagnosis not present

## 2021-01-10 LAB — GLUCOSE, CAPILLARY
Glucose-Capillary: 95 mg/dL (ref 70–99)
Glucose-Capillary: 128 mg/dL — ABNORMAL HIGH (ref 70–99)
Glucose-Capillary: 130 mg/dL — ABNORMAL HIGH (ref 70–99)
Glucose-Capillary: 137 mg/dL — ABNORMAL HIGH (ref 70–99)
Glucose-Capillary: 157 mg/dL — ABNORMAL HIGH (ref 70–99)
Glucose-Capillary: 137 mg/dL — ABNORMAL HIGH (ref 70–99)

## 2021-01-10 MED ORDER — WHITE PETROLATUM EX OINT
TOPICAL_OINTMENT | CUTANEOUS | Status: AC
  Filled 2021-01-10: qty 28.35

## 2021-01-10 NOTE — Progress Notes (Signed)
[facility] PROGRESS NOTE    Sonia Drake  XBJ:[id number] DOB: 02/23/1956 DOA: 10/17/2020 PCP: Garwin Brothers, MD      Brief Narrative:  Sonia Drake is a 65 y.o. 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  Sonia Drake distress was ultimately found to be due to her baseline severe tracheobronchomalacia and asthma.         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 Severe persistent asthma - Continue arformoterol, budesonide, revefenacin, montelukast, loratadine, and famotidine - Continue full mechanical ventilation  Persistent vegetative state due to anoxic brain injury No improvement, there is a high degree of certainty she will not have neurological improvement ever to be able to speak, interact meaningfully or walk again.    - Continue baclofen, low-dose tizanidine - Avoid sedatives - Continue tube feeds  Hypertension Blood pressure low - Stop metoprolol  Seizure disorder No seizures reported today - Continue Keppra, Klonopin, Depakote  Type 2 diabetes Glucoses are normal - Continue sliding scale corrections  Cerebrovascular disease, secondary prevention - Continue Plavix  Obesity BMI 36       Disposition: Status is: Inpatient  Remains inpatient appropriate because:Unsafe d/c plan  Dispo: The patient is from: SNF       Level of care: ICU       MDM: The below labs and imaging reports were reviewed and summarized above.  Medication management as above.   Family Communication: Spoke at length with the patient's son Sonia Drake by video conference, the patient's daughter Sonia Drake at the bedside, and the patient's niece Sonia Drake.  We reviewed the patient's life history, more recent medical history, and the details of her current illness started with  her cardiac arrest in 2021.  Family welcomed frank discussion of the ethical aspects of continuing current medical treatment.  We discussed her current cardiorespiratory status, the likelihood of complications with continued ventilation, and the futility of continued mechanical ventilation in light of her neurological status, and the consensus of our neurology colleagues, our critical care colleagues and even our local [facility] re[facility] facilities that she will not regain the ability to communicate or ambulate.  Son, daughter, and niece are in mournful agreement that she would value the counsel of her children, her doctors, and her nurses to reduce the physical burden of her current treamtent and allow a natural death.  We will work towards expediting Sonia Drake's son's bond hearing so that he can be present at her bedside and will anticipate a compassionate wean from the ventilator at that time.           Subjective: No jerking today, no fever, no diarrhea, no change in breathing, no improvement in mentation.  Objective: Vitals:   01/10/21 1030 01/10/21 1100 01/10/21 1114 01/10/21 1132  BP: (!) 80/52 (!) 88/64  117/67  Pulse: 70 68  75  Resp: _0 Temp:   97.7 F (36.5 C)   TempSrc:   Axillary   SpO2: 99% 100%  100%  Weight:      Height:        Intake/Output Summary (Last 24 hours) at 01/10/2021 1439 Last data filed at 01/10/2021 1100 Gross per 24 hour  Intake 1780 ml  Output 2025 ml  Net -245 ml   Filed Weights   01/06/21 0356 01/07/21 0410 01/08/21 0500  Weight: 91.4  kg 92.3 kg 89.6 kg    Examination: General appearance: Adult female, intubated and comatose HEENT:     Skin:   Cardiac: RR, no murmurs, mild nonpitting Sonia Drake edema Respiratory: Mechanically ventilated via tracheostomy, lung sounds clear without rales or wheezes  Abdomen: No grimace to palpation, no rigidity Neuro: Does not withdraw from pain in hands only, does not withdraw from pain appreciably  in the feet either, gag reflex intact, does not follow with the eyes, no appreciable response to voice, no spontaneous verbalizations or movements.    Psych: Unresponsive.    Data Reviewed: I have personally reviewed following labs and imaging studies:  CBC: Recent Labs  Lab 01/05/21 1637  WBC 7.6  HGB 11.3*  HCT 35.8*  MCV 87.3  PLT 924   Basic Metabolic Panel: Recent Labs  Lab 01/05/21 1637  NA 138  K 4.2  CL 101  CO2 28  GLUCOSE 125*  BUN 15  CREATININE 0.42*  CALCIUM 10.5*   GFR: Estimated Creatinine Clearance: 71 mL/min (A) (by C-G formula based on SCr of 0.42 mg/dL (L)). Liver Function Tests: No results for input(s): AST, ALT, ALKPHOS, BILITOT, PROT, ALBUMIN in the last 168 hours. No results for input(s): LIPASE, AMYLASE in the last 168 hours. No results for input(s): AMMONIA in the last 168 hours. Coagulation Profile: No results for input(s): INR, PROTIME in the last 168 hours. Cardiac Enzymes: No results for input(s): CKTOTAL, CKMB, CKMBINDEX, TROPONINI in the last 168 hours. BNP (last 3 results) No results for input(s): PROBNP in the last 8760 hours. HbA1C: No results for input(s): HGBA1C in the last 72 hours. CBG: Recent Labs  Lab 01/09/21 1943 01/09/21 2356 01/10/21 0318 01/10/21 0732 01/10/21 1115  GLUCAP 140* 127* 95 128* 130*   Lipid Profile: No results for input(s): CHOL, HDL, LDLCALC, TRIG, CHOLHDL, LDLDIRECT in the last 72 hours. Thyroid Function Tests: No results for input(s): TSH, T4TOTAL, FREET4, T3FREE, THYROIDAB in the last 72 hours. Anemia Panel: No results for input(s): VITAMINB12, FOLATE, FERRITIN, TIBC, IRON, RETICCTPCT in the last 72 hours. Urine analysis:    Component Value Date/Time   COLORURINE YELLOW 12/13/2020 1730   APPEARANCEUR CLEAR 12/13/2020 1730   LABSPEC 1.010 12/13/2020 1730   PHURINE 8.0 12/13/2020 1730   GLUCOSEU NEGATIVE 12/13/2020 1730   HGBUR NEGATIVE 12/13/2020 1730   BILIRUBINUR NEGATIVE 12/13/2020  1730   KETONESUR NEGATIVE 12/13/2020 1730   PROTEINUR NEGATIVE 12/13/2020 1730   UROBILINOGEN 0.2 07/03/2014 1456   NITRITE NEGATIVE 12/13/2020 1730   LEUKOCYTESUR NEGATIVE 12/13/2020 1730   Sepsis Labs: _0 (procalcitonin:4,lacticacidven:4)  )No results found for this or any previous visit (from the past 240 hour(s)).       Radiology Studies: No results found.      Scheduled Meds:  arformoterol  15 mcg Nebulization BID   baclofen  5 mg Per Tube TID   budesonide  0.5 mg Nebulization BID   chlorhexidine gluconate (MEDLINE KIT)  15 mL Mouth Rinse BID   Chlorhexidine Gluconate Cloth  6 each Topical Q2000   clonazePAM  1 mg Per Tube BID   clopidogrel  75 mg Per Tube Daily   enoxaparin (LOVENOX) injection  0.5 mg/kg Subcutaneous Q24H   famotidine  20 mg Per Tube BID   feeding supplement (PROSource TF)  45 mL Per Tube BID   fiber  1 packet Per Tube BID   free water  200 mL Per Tube Q4H   Gerhardt's butt cream   Topical BID   guaiFENesin  5 mL Per Tube Q6H   hydrocortisone cream   Topical TID   insulin aspart  0-15 Units Subcutaneous Q4H   insulin glargine-yfgn  10 Units Subcutaneous QHS   levETIRAcetam  2,500 mg Per Tube BID   loratadine  10 mg Per Tube Daily   mouth rinse  15 mL Mouth Rinse 10 times per day   metoprolol tartrate  25 mg Per Tube BID   montelukast  10 mg Per Tube Daily   revefenacin  175 mcg Nebulization Daily   tiZANidine  2 mg Per Tube QHS   valproic acid  1,000 mg Per Tube Q12H   Continuous Infusions:  sodium chloride Stopped (01/01/21 2232)   feeding supplement (JEVITY 1.5 CAL/FIBER) 1,000 mL (01/09/21 1847)     LOS: 101 days    Time spent: 120 minutes    [name], MD [facility] 01/10/2021, 2:39 PM     Please page though [place] or Epic secure chat:  For [employer], Adult nurse

## 2021-01-10 NOTE — Progress Notes (Signed)
CSW spoke with Amy, RN [facility] [employer] to provide her with an update regarding plan of care.  [name], MSW, LCSW Transitions of Care  Clinical Social Worker II [phone]

## 2021-01-11 ENCOUNTER — Inpatient Hospital Stay (HOSPITAL_COMMUNITY): Payer: Medicare Other

## 2021-01-11 DIAGNOSIS — J9621 Acute and chronic respiratory failure with hypoxia: Secondary | ICD-10-CM | POA: Diagnosis not present

## 2021-01-11 DIAGNOSIS — A419 Sepsis, unspecified organism: Secondary | ICD-10-CM | POA: Diagnosis not present

## 2021-01-11 DIAGNOSIS — J9601 Acute respiratory failure with hypoxia: Secondary | ICD-10-CM | POA: Diagnosis not present

## 2021-01-11 LAB — GLUCOSE, CAPILLARY
Glucose-Capillary: 107 mg/dL — ABNORMAL HIGH (ref 70–99)
Glucose-Capillary: 119 mg/dL — ABNORMAL HIGH (ref 70–99)
Glucose-Capillary: 133 mg/dL — ABNORMAL HIGH (ref 70–99)
Glucose-Capillary: 144 mg/dL — ABNORMAL HIGH (ref 70–99)
Glucose-Capillary: 144 mg/dL — ABNORMAL HIGH (ref 70–99)
Glucose-Capillary: 155 mg/dL — ABNORMAL HIGH (ref 70–99)

## 2021-01-11 NOTE — Progress Notes (Signed)
[facility] PROGRESS NOTE    Sonia Drake  YCX:[id number] DOB: 25-Nov-1955 DOA: 09/30/2020 PCP: Garwin Brothers, MD      Brief Narrative:  [name] is a 65 y.o. 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  [name] distress was ultimately found to be due to her baseline severe tracheobronchomalacia and asthma.         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 Severe persistent asthma - Continue arformoterol, budesonide, revefenacin, montelukast, loratadine, and famotidine - Continue full mechanical ventilation  Persistent vegetative state due to anoxic brain injury No improvement, there is a high degree of certainty she will not have neurological improvement ever to be able to speak, interact meaningfully or walk again.    - Continue baclofen and low-dose tizanidine - Avoid sedatives - Continue tube feeds  Hypertension Blood pressure controlled  Sinus tachycardia  Seizure disorder No seizures reported today - Continue Keppra, Klonopin, Depakote  Type 2 diabetes Glucose controlled - Continue sliding scale corrections  Cerebrovascular disease - Continue Plavix  Obesity BMI 36       Disposition: Status is: Inpatient  Remains inpatient appropriate because:Unsafe d/c plan  Dispo: The patient is from: SNF       Level of care: ICU       MDM: The below labs and imaging reports were reviewed and summarized above.  Medication management as above.   Family Communication: Spoke to niece [name] today, confirmed that comfort care and he is in the process of scheduling Mr. Fennel bond hearing, he may be released this weekend          Subjective: No fever, change in mentation and respiratory status, no seizures.  Objective: Vitals:    01/11/21 1938 01/11/21 1940 01/11/21 1943 01/11/21 1955  BP:      Pulse:      Resp:      Temp:    98.7 F (37.1 C)  TempSrc:    Oral  SpO2: 98% 98% 98%   Weight:      Height:        Intake/Output Summary (Last 24 hours) at 01/11/2021 2011 Last data filed at 01/11/2021 1951 Gross per 24 hour  Intake 1980 ml  Output 1000 ml  Net 980 ml   Filed Weights   01/07/21 0410 01/08/21 0500 01/11/21 0457  Weight: 92.3 kg 89.6 kg 91 kg    Examination: General appearance: Adult female, intubated and comatose HEENT:     Skin:   Cardiac: RR, no murmurs, mild nonpitting extremity edema Respiratory: Mechanically ventilated via tracheostomy, lung sounds clear without rales or wheezes  Abdomen: No grimace to palpation, no rigidity Neuro: Does not withdraw from pain in hands only, does not withdraw from pain appreciably in the feet either, gag reflex intact, does not follow with the eyes, no appreciable response to voice, no spontaneous verbalizations or movements.    Psych: Unresponsive.    Data Reviewed: I have personally reviewed following labs and imaging studies:  CBC: Recent Labs  Lab 01/05/21 1637  WBC 7.6  HGB 11.3*  HCT 35.8*  MCV 87.3  PLT 497   Basic Metabolic Panel: Recent Labs  Lab 01/05/21 1637  NA 138  K 4.2  CL 101  CO2 28  GLUCOSE 125*  BUN 15  CREATININE 0.42*  CALCIUM 10.5*   GFR:  Estimated Creatinine Clearance: 71.6 mL/min (A) (by C-G formula based on SCr of 0.42 mg/dL (L)). Liver Function Tests: No results for input(s): AST, ALT, ALKPHOS, BILITOT, PROT, ALBUMIN in the last 168 hours. No results for input(s): LIPASE, AMYLASE in the last 168 hours. No results for input(s): AMMONIA in the last 168 hours. Coagulation Profile: No results for input(s): INR, PROTIME in the last 168 hours. Cardiac Enzymes: No results for input(s): CKTOTAL, CKMB, CKMBINDEX, TROPONINI in the last 168 hours. BNP (last 3 results) No results for input(s): PROBNP in the last  8760 hours. HbA1C: No results for input(s): HGBA1C in the last 72 hours. CBG: Recent Labs  Lab 01/11/21 0350 01/11/21 0748 01/11/21 1108 01/11/21 1522 01/11/21 1929  GLUCAP 133* 144* 119* 107* 155*   Lipid Profile: No results for input(s): CHOL, HDL, LDLCALC, TRIG, CHOLHDL, LDLDIRECT in the last 72 hours. Thyroid Function Tests: No results for input(s): TSH, T4TOTAL, FREET4, T3FREE, THYROIDAB in the last 72 hours. Anemia Panel: No results for input(s): VITAMINB12, FOLATE, FERRITIN, TIBC, IRON, RETICCTPCT in the last 72 hours. Urine analysis:    Component Value Date/Time   COLORURINE YELLOW 12/13/2020 1730   APPEARANCEUR CLEAR 12/13/2020 1730   LABSPEC 1.010 12/13/2020 1730   PHURINE 8.0 12/13/2020 1730   GLUCOSEU NEGATIVE 12/13/2020 1730   HGBUR NEGATIVE 12/13/2020 1730   BILIRUBINUR NEGATIVE 12/13/2020 1730   KETONESUR NEGATIVE 12/13/2020 1730   PROTEINUR NEGATIVE 12/13/2020 1730   UROBILINOGEN 0.2 07/03/2014 1456   NITRITE NEGATIVE 12/13/2020 1730   LEUKOCYTESUR NEGATIVE 12/13/2020 1730   Sepsis Labs: _0 (procalcitonin:4,lacticacidven:4)  )No results found for this or any previous visit (from the past 240 hour(s)).       Radiology Studies: DG CHEST PORT 1 VIEW  Result Date: 01/11/2021 CLINICAL DATA:  Tracheostomy. EXAM: PORTABLE CHEST 1 VIEW COMPARISON:  Chest radiograph dated 12/23/2020. FINDINGS: Similar positioning of the tracheostomy. Reticular densities at the left lung base may represent atelectasis. Developing infiltrate is not excluded clinical correlation is recommended. No focal consolidation, pleural effusion, or pneumothorax the cardiac silhouette is within normal limits. No acute osseous pathology. IMPRESSION: 1. Similar positioning of the tracheostomy. 2. Left lung base atelectasis versus developing infiltrate. Electronically Signed   By: Anner Crete M.D.   On: 01/11/2021 19:29        Scheduled Meds:  arformoterol  15 mcg Nebulization  BID   baclofen  5 mg Per Tube TID   budesonide  0.5 mg Nebulization BID   chlorhexidine gluconate (MEDLINE KIT)  15 mL Mouth Rinse BID   Chlorhexidine Gluconate Cloth  6 each Topical Q2000   clonazePAM  1 mg Per Tube BID   clopidogrel  75 mg Per Tube Daily   enoxaparin (LOVENOX) injection  0.5 mg/kg Subcutaneous Q24H   famotidine  20 mg Per Tube BID   feeding supplement (PROSource TF)  45 mL Per Tube BID   fiber  1 packet Per Tube BID   free water  200 mL Per Tube Q4H   Gerhardt's butt cream   Topical BID   guaiFENesin  5 mL Per Tube Q6H   hydrocortisone cream   Topical TID   insulin aspart  0-15 Units Subcutaneous Q4H   insulin glargine-yfgn  10 Units Subcutaneous QHS   levETIRAcetam  2,500 mg Per Tube BID   loratadine  10 mg Per Tube Daily   mouth rinse  15 mL Mouth Rinse 10 times per day   metoprolol tartrate  25 mg Per Tube BID   montelukast  10 mg Per Tube Daily   revefenacin  175 mcg Nebulization Daily   tiZANidine  2 mg Per Tube QHS   valproic acid  1,000 mg Per Tube Q12H   Continuous Infusions:  sodium chloride Stopped (01/01/21 2232)   feeding supplement (JEVITY 1.5 CAL/FIBER) 1,000 mL (01/10/21 2146)     LOS: 102 days    Time spent: 15 minutes    [name], MD [facility] 01/11/2021, 8:11 PM     Please page though [place] or Epic secure chat:  For [employer], Adult nurse

## 2021-01-11 NOTE — Progress Notes (Signed)
NAME:  Sonia Drake, MRN:  [id number], DOB:  [birth year], LOS: 102 ADMISSION DATE:  10/08/2020, CONSULTATION DATE:  10/04/2020 REFERRING MD:  Thedore Mins, CHIEF COMPLAINT:  Respiratory Failure   History of Present Illness:  Patient is a 65 years old female with history of asthma/COPD, congestive heart failure, cardiac arrest with PEA, chronic respiratory failure on permanent trach at 6 L/min, type 2 diabetes mellitus, GERD, seizures and severe hypoxic ischemic encephalopathy presented to the hospital with respiratory distress from skilled nursing facility.  Patient was noted to have aspiration pneumonitis and was put on mechanical ventilation.  She also underwent bronchoscopy on 6/16 which showed collapse of her trachea so tracheostomy was subsequently changed.  Currently on ventilator support through tracheostomy.  Patient has poor overall prognosis due to seizure-like activity during hospitalization.   Family wishes to give patient a full 90 days from date of admission to see if there is any improvement.  This time frame was based on patient's advanced directive.  Pertinent  Medical History   Past Medical History:  Diagnosis Date   Arthritis    "right knee; left hip" (04/07/2017)   Asthma    CHF (congestive heart failure) (HCC)    reported treatment for fluid overload in the setting of asthma exacerbation ~ 1998   COPD (chronic obstructive pulmonary disease) (HCC)    patient states she does not have COPD but was treated for it.    Depression    "from chemical imbalance" (04/07/2017)   Dyspnea    Dysrhythmia    occasional skipped beats   GERD (gastroesophageal reflux disease)    On home oxygen therapy    "I sleep with 2L" (04/07/2017)   Pneumonia    "several times" (04/07/2017)   Seizures (HCC) 2003 X 1   unknown cause     Significant Hospital Events: Including procedures, antibiotic start and stop dates in addition to other pertinent events   6/12 admitted from skilled nursing facility  after aspiration event and copious secretions per tracheostomy 6/13 appears comfortable currently on 35% ATC. No distress. WBC ct trending down. No events overnight growing GPC clusters in two of two cultures  6/16 increased work of breathing, upper airway noise, abdominal muscle use.  Bronchoscopy performed as trach exchange cuffless #8 Shiley XLT showed dynamic airway collapse, tracheal collapse. 6/16 continued respiratory distress moved to ICU, changed to cuffed #6 Shiley XLT, placed on MV with some improved comfort 6/12 cefepime > 6/14, 6/18 6/12 vanc > 6/14, 6/18 6/23 mucus plugging, severe vent dyssynchrony 6/24-25 intermittent jerking, seizures on EEG 6/27 weaned off propofol gtt 6/28 briefly tolerated trach collar in AM 6/29 tolerated trach collar in AM; no further seizure like activity; working with CM/SW for vent-SNF 7/1 Weaning on CPAP/ PS 10/5, CM SW working on [employer], NO seizure activity, secretions are not an issue per nursing 7/11 trach changed to #8 Bivona with air cuff, much improved. 7/12 TCT 12 hours; rested on Vent overnight 7/13 on TCT 7/23 Trach collar tolerated 7/24 Full vent support 7/26>> Remains on full vent support this am, no issues with secretions per nursing 8/5 occluding trach with dynamic collapse: seen on bronchoscopy 8/6 called back emergently to bedside for tracheostomy occlusion: emergent bronch, bivona trach migrated back 2cm, improved with advancing to 2cm above carina 8/8 collapse caused hypoxia. Improved with repositioning  8/9 afternoon had another episode of tracheal collapse and occlusion of trach requiring BVM, sedation. Family produced paperwork which seems to have conflicting information about pts  wishes  8/11: pt continues to have multiple daily episodes of tracheal collapse/mucous plugging req lavage, BVM. 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  Neuro consulted 8/30 overnight , BP drop which responded to fluid bolus 9/2 Continues to have labile BP, now midodrine prn 9/14 Appreciate ethics review of advance directives. Support ethics recommendation regarding patient autonomy  by following her wishes as she outlined.   Interim History / Subjective:  9/14 Appreciate ethics review of advance directives. Support ethics recommendation regarding patient autonomy  by following her wishes as she outlined.  9/22 GOC discussion with family. Working towards gathering family to bedside to allow for a natural death.  Objective   Blood pressure (!) 113/58, pulse 78, temperature 98.1 F (36.7 C), temperature source Axillary, resp. rate 16, height 5\' 2"  (1.575 m), weight 91 kg, SpO2 99 %.    Vent Mode: PRVC FiO2 (%):  [30 %] 30 % Set Rate:  [16 bmp] 16 bmp Vt Set:  [400 mL] 400 mL PEEP:  [5 cmH20] 5 cmH20 Plateau Pressure:  [14 cmH20-18 cmH20] 17 cmH20   Intake/Output Summary (Last 24 hours) at 01/11/2021 0843 Last data filed at 01/11/2021 0800 Gross per 24 hour  Intake 1635 ml  Output 1150 ml  Net 485 ml   Filed Weights   01/07/21 0410 01/08/21 0500 01/11/21 0457  Weight: 92.3 kg 89.6 kg 91 kg    Examination: General:  in bed, NAD, not responsive HEENT: MM pink/moist, anicteric, atraumatic, trach C/D/I Neuro:  cough present, no tracking, no withdraw to pain CV: S1S2, NSR, no m/r/g appreciated PULM:  air movement in all lobes, Trachea midline, chest expansion symmetric GI: soft, bsx4 active, non distended   Extremities: warm/dry, no edema, capillary refill less than 3 seconds  Skin: no rashes or lesions   Assessment & Plan:  Anoxic encephalopathy / anoxic brain injury w/ PVS Acute on chronic respiratory failure w hypercarbia and hypoxia  Ventilator dependence  Severe tracheobronchomalacia Tracheostomy dependent Hx COPD  Dynamic airway collapse Aitana remains ventilator dependent due to her anoxic injury along with he dynamic  airway collapse from severe tracheobronchomalacia. It is very unlikely she will ever come off the ventilator.   Plan -No change in poor neurological exam, remains in persistent respiratory failure. Continue current full ventilator support  -Continue alformoterol, budesonide, revefenacin, montelukast, loratadine, and famotidine  -VAP bundle -Continue goals of care discussions with family. Hospital day 101. Appreciate ethics review of advance directives. Support ethics recommendation regarding patient autonomy  by following her wishes as she outlined. See Corrie Dandy DO note 9/14.  All other interventions per hospitalist team  PCCM will follow weekly  Best Practice (right click and "Reselect all SmartList Selections" daily)  Per primary  Labs   CBC: Recent Labs  Lab 01/05/21 1637  WBC 7.6  HGB 11.3*  HCT 35.8*  MCV 87.3  PLT 364    Basic Metabolic Panel: Recent Labs  Lab 01/05/21 1637  NA 138  K 4.2  CL 101  CO2 28  GLUCOSE 125*  BUN 15  CREATININE 0.42*  CALCIUM 10.5*   GFR: Estimated Creatinine Clearance: 71.6 mL/min (A) (by C-G formula based on SCr of 0.42 mg/dL (L)). Recent Labs  Lab 01/05/21 1637  WBC 7.6    Liver Function Tests: No results for input(s): AST, ALT, ALKPHOS, BILITOT, PROT, ALBUMIN in the last 168 hours. No results for input(s): LIPASE, AMYLASE in  the last 168 hours. No results for input(s): AMMONIA in the last 168 hours.  ABG    Component Value Date/Time   PHART 7.414 11/20/2020 1253   PCO2ART 52.3 (H) 11/20/2020 1253   PO2ART 127 (H) 11/20/2020 1253   HCO3 33.6 (H) 11/20/2020 1253   TCO2 35 (H) 11/20/2020 1253   O2SAT 99.0 11/20/2020 1253     Coagulation Profile: No results for input(s): INR, PROTIME in the last 168 hours.  Cardiac Enzymes: No results for input(s): CKTOTAL, CKMB, CKMBINDEX, TROPONINI in the last 168 hours.  HbA1C: Hgb A1c MFr Bld  Date/Time Value Ref Range Status  10/18/2020 06:31 PM 7.0 (H) 4.8 - 5.6 % Final     Comment:    (NOTE)         Prediabetes: 5.7 - 6.4         Diabetes: >6.4         Glycemic control for adults with diabetes: <7.0   08/30/2019 04:53 AM 6.5 (H) 4.8 - 5.6 % Final    Comment:    (NOTE) Pre diabetes:          5.7%-6.4% Diabetes:              >6.4% Glycemic control for   <7.0% adults with diabetes     CBG: Recent Labs  Lab 01/10/21 1509 01/10/21 1915 01/10/21 2318 01/11/21 0350 01/11/21 0748  GLUCAP 137* 157* 137* 133* 144*    Critical care time: NA    Gershon Mussel., MSN, APRN, AGACNP-BC [facility]  01/11/2021 , 8:43 AM  Please see [email or web address] for pager details  If no response, please call [phone] After hours, please call Elink at [phone]

## 2021-01-12 DIAGNOSIS — A419 Sepsis, unspecified organism: Secondary | ICD-10-CM | POA: Diagnosis not present

## 2021-01-12 DIAGNOSIS — J9621 Acute and chronic respiratory failure with hypoxia: Secondary | ICD-10-CM | POA: Diagnosis not present

## 2021-01-12 DIAGNOSIS — J9601 Acute respiratory failure with hypoxia: Secondary | ICD-10-CM | POA: Diagnosis not present

## 2021-01-12 LAB — GLUCOSE, CAPILLARY
Glucose-Capillary: 101 mg/dL — ABNORMAL HIGH (ref 70–99)
Glucose-Capillary: 118 mg/dL — ABNORMAL HIGH (ref 70–99)
Glucose-Capillary: 127 mg/dL — ABNORMAL HIGH (ref 70–99)
Glucose-Capillary: 136 mg/dL — ABNORMAL HIGH (ref 70–99)
Glucose-Capillary: 150 mg/dL — ABNORMAL HIGH (ref 70–99)
Glucose-Capillary: 96 mg/dL (ref 70–99)

## 2021-01-12 NOTE — Progress Notes (Signed)
CSW and chaplain Venida Jarvis spoke with Sonia Drake at [facility] to request the patient's son Sonia Drake be transported to [facility] for a visit. Sonia Drake spoke with Sonia Drake who declined the request and offered to allow Sonia Drake to participate in a video chat only.   CSW spoke with Dr. Maryfrances Bunnell to provide him with an update.  [name], MSW, LCSW Transitions of Care  Clinical Social Worker II [phone]

## 2021-01-12 NOTE — Progress Notes (Signed)
[name] PROGRESS NOTE    Sonia Drake  OEV:[id number] DOB: [birth year] DOA: 10/19/2020 PCP: Garwin Brothers, MD      Brief Narrative:  Sonia Drake is a 65 y.o. 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  [name] distress was ultimately found to be due to her baseline severe tracheobronchomalacia and asthma.         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 Severe persistent asthma - Continue arformoterol, budesonide, revefenacin, montelukast, loratadine, famotidine - Continue full mechanical ventilation  Persistent vegetative state due to anoxic brain injury No improvement, there is a high degree of certainty she will not have neurological improvement ever to be able to speak, interact meaningfully or walk again.    - Continue baclofen and low-dose tizanidine - Avoid sedatives - Continue tube feeds  Hypertension Blood pressure labile  Sinus tachycardia  Seizure disorder No seizures reported today - Continue Keppra, Klonopin, Depakote  Type 2 diabetes Glucose controlled - Continue sliding scale corrections  Cerebrovascular disease, secondary prevention - Continue Plavix  Obesity BMI 36       Disposition: Status is: Inpatient  Remains inpatient appropriate because:Unsafe d/c plan  Dispo: The patient is from: SNF       Level of care: ICU       MDM: The below labs and imaging reports were reviewed and summarized above.  Medication management as above.   Family Communication: Called to son's attorney to clarify time frame for bond hearing, possible release          Subjective: No fever, change in mentation, respiratory status, or seizures.  Objective: Vitals:   01/12/21 1200 01/12/21 1300 01/12/21 1508 01/12/21 1526  BP:  (!) 90/50 108/90    Pulse: 69 86    Resp: 16 (!) 22    Temp:    97.9 F (36.6 C)  TempSrc:    Oral  SpO2: 98% 100% 100%   Weight:      Height:        Intake/Output Summary (Last 24 hours) at 01/12/2021 1552 Last data filed at 01/12/2021 1300 Gross per 24 hour  Intake 2090 ml  Output 1575 ml  Net 515 ml   Filed Weights   01/08/21 0500 01/11/21 0457 01/12/21 0500  Weight: 89.6 kg 91 kg 89.1 kg    Examination: General appearance: Adult female, intubated and unresponsive HEENT:     Skin:   Cardiac: RRR, no murmurs, mild nonpitting extremity edema in all 4 extremities Respiratory: Mechanically ventilated via tracheostomy, lung sounds rhonchorous, no rales or wheezes Abdomen: No rigidity or masses, no distention, no grimace to palpation Neuro: No spontaneous verbalizations, no spontaneous movements, no withdrawal from noxious stimuli in all 4 extremities, no spontaneous eye movements.  She blinks her eyes, sometimes eyes are open sometimes are closed.    Psych: Unresponsive.    Data Reviewed: I have personally reviewed following labs and imaging studies:  CBC: Recent Labs  Lab 01/05/21 1637  WBC 7.6  HGB 11.3*  HCT 35.8*  MCV 87.3  PLT 829   Basic Metabolic Panel: Recent Labs  Lab 01/05/21 1637  NA 138  K 4.2  CL 101  CO2 28  GLUCOSE 125*  BUN 15  CREATININE 0.42*  CALCIUM 10.5*   GFR: Estimated Creatinine Clearance: 70.8 mL/min (A) (by C-G formula based on  SCr of 0.42 mg/dL (L)). Liver Function Tests: No results for input(s): AST, ALT, ALKPHOS, BILITOT, PROT, ALBUMIN in the last 168 hours. No results for input(s): LIPASE, AMYLASE in the last 168 hours. No results for input(s): AMMONIA in the last 168 hours. Coagulation Profile: No results for input(s): INR, PROTIME in the last 168 hours. Cardiac Enzymes: No results for input(s): CKTOTAL, CKMB, CKMBINDEX, TROPONINI in the last 168 hours. BNP (last 3 results) No results for input(s): PROBNP in the last  8760 hours. HbA1C: No results for input(s): HGBA1C in the last 72 hours. CBG: Recent Labs  Lab 01/11/21 2337 01/12/21 0336 01/12/21 0737 01/12/21 1133 01/12/21 1524  GLUCAP 144* 118* 127* 101* 96   Lipid Profile: No results for input(s): CHOL, HDL, LDLCALC, TRIG, CHOLHDL, LDLDIRECT in the last 72 hours. Thyroid Function Tests: No results for input(s): TSH, T4TOTAL, FREET4, T3FREE, THYROIDAB in the last 72 hours. Anemia Panel: No results for input(s): VITAMINB12, FOLATE, FERRITIN, TIBC, IRON, RETICCTPCT in the last 72 hours. Urine analysis:    Component Value Date/Time   COLORURINE YELLOW 12/13/2020 1730   APPEARANCEUR CLEAR 12/13/2020 1730   LABSPEC 1.010 12/13/2020 1730   PHURINE 8.0 12/13/2020 1730   GLUCOSEU NEGATIVE 12/13/2020 1730   HGBUR NEGATIVE 12/13/2020 1730   BILIRUBINUR NEGATIVE 12/13/2020 1730   KETONESUR NEGATIVE 12/13/2020 1730   PROTEINUR NEGATIVE 12/13/2020 1730   UROBILINOGEN 0.2 07/03/2014 1456   NITRITE NEGATIVE 12/13/2020 1730   LEUKOCYTESUR NEGATIVE 12/13/2020 1730   Sepsis Labs: @LABRCNTIP (procalcitonin:4,lacticacidven:4)  )No results found for this or any previous visit (from the past 240 hour(s)).       Radiology Studies: DG CHEST PORT 1 VIEW  Result Date: 01/11/2021 CLINICAL DATA:  Tracheostomy. EXAM: PORTABLE CHEST 1 VIEW COMPARISON:  Chest radiograph dated 12/23/2020. FINDINGS: Similar positioning of the tracheostomy. Reticular densities at the left lung base may represent atelectasis. Developing infiltrate is not excluded clinical correlation is recommended. No focal consolidation, pleural effusion, or pneumothorax the cardiac silhouette is within normal limits. No acute osseous pathology. IMPRESSION: 1. Similar positioning of the tracheostomy. 2. Left lung base atelectasis versus developing infiltrate. Electronically Signed   By: Anner Crete M.D.   On: 01/11/2021 19:29        Scheduled Meds:  arformoterol  15 mcg Nebulization  BID   baclofen  5 mg Per Tube TID   budesonide  0.5 mg Nebulization BID   chlorhexidine gluconate (MEDLINE KIT)  15 mL Mouth Rinse BID   Chlorhexidine Gluconate Cloth  6 each Topical Q2000   clonazePAM  1 mg Per Tube BID   clopidogrel  75 mg Per Tube Daily   enoxaparin (LOVENOX) injection  0.5 mg/kg Subcutaneous Q24H   famotidine  20 mg Per Tube BID   feeding supplement (PROSource TF)  45 mL Per Tube BID   fiber  1 packet Per Tube BID   free water  200 mL Per Tube Q4H   Gerhardt's butt cream   Topical BID   guaiFENesin  5 mL Per Tube Q6H   hydrocortisone cream   Topical TID   insulin aspart  0-15 Units Subcutaneous Q4H   insulin glargine-yfgn  10 Units Subcutaneous QHS   levETIRAcetam  2,500 mg Per Tube BID   loratadine  10 mg Per Tube Daily   mouth rinse  15 mL Mouth Rinse 10 times per day   metoprolol tartrate  25 mg Per Tube BID   montelukast  10 mg Per Tube Daily   revefenacin  175  mcg Nebulization Daily   tiZANidine  2 mg Per Tube QHS   valproic acid  1,000 mg Per Tube Q12H   Continuous Infusions:  sodium chloride Stopped (01/01/21 2232)   feeding supplement (JEVITY 1.5 CAL/FIBER) 1,000 mL (01/12/21 0035)     LOS: 103 days    Time spent: 25 minutes    [name], MD [facility] 01/12/2021, 3:52 PM     Please page though [place] or Epic secure chat:  For [employer], Adult nurse

## 2021-01-12 NOTE — Progress Notes (Signed)
RT note- Patient continues to high pressure with ventilator. Moderate amount secretions, RN has given sedation still high pressuring. Continue to monitor.

## 2021-01-13 DIAGNOSIS — A419 Sepsis, unspecified organism: Secondary | ICD-10-CM | POA: Diagnosis not present

## 2021-01-13 DIAGNOSIS — J9621 Acute and chronic respiratory failure with hypoxia: Secondary | ICD-10-CM | POA: Diagnosis not present

## 2021-01-13 DIAGNOSIS — J9601 Acute respiratory failure with hypoxia: Secondary | ICD-10-CM | POA: Diagnosis not present

## 2021-01-13 LAB — GLUCOSE, CAPILLARY
Glucose-Capillary: 116 mg/dL — ABNORMAL HIGH (ref 70–99)
Glucose-Capillary: 124 mg/dL — ABNORMAL HIGH (ref 70–99)
Glucose-Capillary: 129 mg/dL — ABNORMAL HIGH (ref 70–99)
Glucose-Capillary: 156 mg/dL — ABNORMAL HIGH (ref 70–99)
Glucose-Capillary: 165 mg/dL — ABNORMAL HIGH (ref 70–99)
Glucose-Capillary: 95 mg/dL (ref 70–99)

## 2021-01-13 NOTE — Progress Notes (Signed)
Sonia Drake [facility] PROGRESS NOTE    Sonia Drake  NOI:[id number] DOB: 23-Oct-1955 DOA: 09/30/2020 PCP: Garwin Brothers, MD      Brief Narrative:  Sonia Drake is a 65 y.o. 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  Sonia Drake distress was ultimately found to be due to her baseline severe tracheobronchomalacia and asthma.         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  Medication management as above.   Family Communication: Spoke with daughter Sonia Drake, who recommended she notify other family to come and pay their respects in the next few days.  We have not yet heard about a date for the bone hearing, although I have heard rumors that the patient's son would not be able to postpone, in which case we will proceed with compassionate wean from the ventilator on Wednesday without him.          Subjective: No new fever, change in mentation, change in respiratory status, no new seizures.  Objective: Vitals:   01/13/21 1400 01/13/21 1516 01/13/21 1530 01/13/21 1600   BP: (!) 169/104   (!) 139/98  Pulse: (!) 109   (!) 112  Resp: 18   (!) 22  Temp:  98.4 F (36.9 C)    TempSrc:  Axillary    SpO2: 100%  100% 99%  Weight:      Height:        Intake/Output Summary (Last 24 hours) at 01/13/2021 1807 Last data filed at 01/13/2021 1745 Gross per 24 hour  Intake 2495 ml  Output 1850 ml  Net 645 ml   Filed Weights   01/11/21 0457 01/12/21 0500 01/13/21 0454  Weight: 91 kg 89.1 kg 90.5 kg    Examination: General appearance: Adult female, intubated, unresponsive HEENT:     Skin:   Cardiac: RRR, no murmurs, mild nonpitting edema in all 4 Respiratory: Mechanically ventilated via tracheostomy, coarse breath sounds bilaterally but no rales or wheezing Abdomen: No rigidity or masses, no distention, no grimace to palpation Neuro: No spontaneous verbalizations, no spontaneous movements, no withdraw from noxious stimuli in all 4 extremities, the eyes are fixed, pupils are reactive    Psych: Unresponsive.    Data Reviewed: I have personally reviewed following labs and imaging studies:  CBC: No results for input(s): WBC, NEUTROABS, HGB, HCT, MCV, PLT in the last 168 hours.  Basic Metabolic Panel: No results for input(s): NA, K, CL, CO2, GLUCOSE, BUN, CREATININE, CALCIUM, MG, PHOS in the last 168 hours.  GFR: Estimated Creatinine Clearance: 71.4 mL/min (A) (by C-G formula based on SCr of 0.42 mg/dL (L)). Liver Function Tests: No results for input(s): AST, ALT, ALKPHOS, BILITOT, PROT, ALBUMIN  in the last 168 hours. No results for input(s): LIPASE, AMYLASE in the last 168 hours. No results for input(s): AMMONIA in the last 168 hours. Coagulation Profile: No results for input(s): INR, PROTIME in the last 168 hours. Cardiac Enzymes: No results for input(s): CKTOTAL, CKMB, CKMBINDEX, TROPONINI in the last 168 hours. BNP (last 3 results) No results for input(s): PROBNP in the last 8760 hours. HbA1C: No results for input(s): HGBA1C in the last 72  hours. CBG: Recent Labs  Lab 01/12/21 2333 01/13/21 0342 01/13/21 0738 01/13/21 1133 01/13/21 1514  GLUCAP 150* 95 129* 124* 116*   Lipid Profile: No results for input(s): CHOL, HDL, LDLCALC, TRIG, CHOLHDL, LDLDIRECT in the last 72 hours. Thyroid Function Tests: No results for input(s): TSH, T4TOTAL, FREET4, T3FREE, THYROIDAB in the last 72 hours. Anemia Panel: No results for input(s): VITAMINB12, FOLATE, FERRITIN, TIBC, IRON, RETICCTPCT in the last 72 hours. Urine analysis:    Component Value Date/Time   COLORURINE YELLOW 12/13/2020 1730   APPEARANCEUR CLEAR 12/13/2020 1730   LABSPEC 1.010 12/13/2020 1730   PHURINE 8.0 12/13/2020 1730   GLUCOSEU NEGATIVE 12/13/2020 1730   HGBUR NEGATIVE 12/13/2020 1730   BILIRUBINUR NEGATIVE 12/13/2020 1730   KETONESUR NEGATIVE 12/13/2020 1730   PROTEINUR NEGATIVE 12/13/2020 1730   UROBILINOGEN 0.2 07/03/2014 1456   NITRITE NEGATIVE 12/13/2020 1730   LEUKOCYTESUR NEGATIVE 12/13/2020 1730   Sepsis Labs: @LABRCNTIP (procalcitonin:4,lacticacidven:4)  )No results found for this or any previous visit (from the past 240 hour(s)).       Radiology Studies: DG CHEST PORT 1 VIEW  Result Date: 01/11/2021 CLINICAL DATA:  Tracheostomy. EXAM: PORTABLE CHEST 1 VIEW COMPARISON:  Chest radiograph dated 12/23/2020. FINDINGS: Similar positioning of the tracheostomy. Reticular densities at the left lung base may represent atelectasis. Developing infiltrate is not excluded clinical correlation is recommended. No focal consolidation, pleural effusion, or pneumothorax the cardiac silhouette is within normal limits. No acute osseous pathology. IMPRESSION: 1. Similar positioning of the tracheostomy. 2. Left lung base atelectasis versus developing infiltrate. Electronically Signed   By: Anner Crete M.D.   On: 01/11/2021 19:29        Scheduled Meds:  arformoterol  15 mcg Nebulization BID   baclofen  5 mg Per Tube TID   budesonide  0.5 mg Nebulization  BID   chlorhexidine gluconate (MEDLINE KIT)  15 mL Mouth Rinse BID   Chlorhexidine Gluconate Cloth  6 each Topical Q2000   clonazePAM  1 mg Per Tube BID   enoxaparin (LOVENOX) injection  0.5 mg/kg Subcutaneous Q24H   famotidine  20 mg Per Tube BID   feeding supplement (PROSource TF)  45 mL Per Tube BID   fiber  1 packet Per Tube BID   free water  200 mL Per Tube Q4H   Gerhardt's butt cream   Topical BID   guaiFENesin  5 mL Per Tube Q6H   hydrocortisone cream   Topical TID   insulin aspart  0-15 Units Subcutaneous Q4H   insulin glargine-yfgn  10 Units Subcutaneous QHS   levETIRAcetam  2,500 mg Per Tube BID   loratadine  10 mg Per Tube Daily   mouth rinse  15 mL Mouth Rinse 10 times per day   metoprolol tartrate  25 mg Per Tube BID   montelukast  10 mg Per Tube Daily   revefenacin  175 mcg Nebulization Daily   tiZANidine  2 mg Per Tube QHS   valproic acid  1,000 mg Per Tube Q12H   Continuous Infusions:  sodium chloride Stopped (01/01/21 2232)   feeding supplement (JEVITY 1.5 CAL/FIBER) 1,000 mL (01/13/21 0300)     LOS: 104 days    Time spent: 25  minutes    [name], MD [facility] 01/13/2021, 6:07 PM     Please page though [place] or Epic secure chat:  For [employer], Adult nurse

## 2021-01-14 DIAGNOSIS — A419 Sepsis, unspecified organism: Secondary | ICD-10-CM | POA: Diagnosis not present

## 2021-01-14 DIAGNOSIS — J9621 Acute and chronic respiratory failure with hypoxia: Secondary | ICD-10-CM | POA: Diagnosis not present

## 2021-01-14 DIAGNOSIS — J9601 Acute respiratory failure with hypoxia: Secondary | ICD-10-CM | POA: Diagnosis not present

## 2021-01-14 LAB — GLUCOSE, CAPILLARY
Glucose-Capillary: 130 mg/dL — ABNORMAL HIGH (ref 70–99)
Glucose-Capillary: 104 mg/dL — ABNORMAL HIGH (ref 70–99)
Glucose-Capillary: 103 mg/dL — ABNORMAL HIGH (ref 70–99)
Glucose-Capillary: 113 mg/dL — ABNORMAL HIGH (ref 70–99)
Glucose-Capillary: 147 mg/dL — ABNORMAL HIGH (ref 70–99)

## 2021-01-14 NOTE — Progress Notes (Signed)
[facility] PROGRESS NOTE    Sonia Drake  KDT:[id number] DOB: 1955-05-12 DOA: 10/14/2020 PCP: Garwin Brothers, MD      Brief Narrative:  Sonia Drake is a 65 y.o. 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  Sonia Drake distress was ultimately found to be due to her baseline severe tracheobronchomalacia and asthma.         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  Medication management as above.   Family Communication: Spoke with daughter Sonia Drake yesterday, recommended she notify other family to come and pay their respects in the next few days.  We have not yet heard about a date for Sonia Drake's bond hearing, although I have heard rumors that he will not be able to make post bond, even if he gets a hearing, in which case we will proceed with compassionate wean from the ventilator on Wednesday (tentatively) without him.          Subjective: Had some basic twitching today, no generalized tonic-clonic  seizures, no respiratory distress, no fever.  Objective: Vitals:   01/14/21 0816 01/14/21 0900 01/14/21 0901 01/14/21 0903  BP: (!) 97/55 (!) 93/12    Pulse: 79 74    Resp: 16 15    Temp:      TempSrc:      SpO2: 100% 100% 100% 100%  Weight:      Height:        Intake/Output Summary (Last 24 hours) at 01/14/2021 0958 Last data filed at 01/14/2021 0900 Gross per 24 hour  Intake 2530 ml  Output 1600 ml  Net 930 ml   Filed Weights   01/12/21 0500 01/13/21 0454 01/14/21 0500  Weight: 89.1 kg 90.5 kg 90.5 kg    Examination: General appearance: Adult female, intubated, unresponsive HEENT:     Skin:   Cardiac: Heart rate regular, slightly tachycardic, no murmurs, mild nonpitting edema in all 4 extremities Respiratory: Mechanically ventilated via tracheostomy, coarse wheezing bilaterally, no rales Abdomen: No grimace to palpation, no masses or distention Neuro: Does not withdraw from noxious stimuli in all 4 extremities or from the face, no spontaneous verbalizations, no spontaneous movements, the eyes do not track, they are both reactive to light, and open and blinking, with slight amblyopia    Psych: Unresponsive    Data Reviewed: I have personally reviewed following labs and imaging studies:  CBC: No results for input(s): WBC, NEUTROABS, HGB, HCT, MCV, PLT in the last 168 hours.  Basic Metabolic Panel: No results for input(s): NA, K, CL, CO2, GLUCOSE, BUN, CREATININE, CALCIUM, MG, PHOS in the last 168 hours.  GFR:  Estimated Creatinine Clearance: 71.4 mL/min (A) (by C-G formula based on SCr of 0.42 mg/dL (L)). Liver Function Tests: No results for input(s): AST, ALT, ALKPHOS, BILITOT, PROT, ALBUMIN in the last 168 hours. No results for input(s): LIPASE, AMYLASE in the last 168 hours. No results for input(s): AMMONIA in the last 168 hours. Coagulation Profile: No results for input(s): INR, PROTIME in the last 168 hours. Cardiac Enzymes: No results for input(s): CKTOTAL,  CKMB, CKMBINDEX, TROPONINI in the last 168 hours. BNP (last 3 results) No results for input(s): PROBNP in the last 8760 hours. HbA1C: No results for input(s): HGBA1C in the last 72 hours. CBG: Recent Labs  Lab 01/13/21 1514 01/13/21 1939 01/13/21 2337 01/14/21 0353 01/14/21 0709  GLUCAP 116* 156* 165* 130* 104*   Lipid Profile: No results for input(s): CHOL, HDL, LDLCALC, TRIG, CHOLHDL, LDLDIRECT in the last 72 hours. Thyroid Function Tests: No results for input(s): TSH, T4TOTAL, FREET4, T3FREE, THYROIDAB in the last 72 hours. Anemia Panel: No results for input(s): VITAMINB12, FOLATE, FERRITIN, TIBC, IRON, RETICCTPCT in the last 72 hours. Urine analysis:    Component Value Date/Time   COLORURINE YELLOW 12/13/2020 1730   APPEARANCEUR CLEAR 12/13/2020 1730   LABSPEC 1.010 12/13/2020 1730   PHURINE 8.0 12/13/2020 1730   GLUCOSEU NEGATIVE 12/13/2020 1730   HGBUR NEGATIVE 12/13/2020 1730   BILIRUBINUR NEGATIVE 12/13/2020 1730   KETONESUR NEGATIVE 12/13/2020 1730   PROTEINUR NEGATIVE 12/13/2020 1730   UROBILINOGEN 0.2 07/03/2014 1456   NITRITE NEGATIVE 12/13/2020 1730   LEUKOCYTESUR NEGATIVE 12/13/2020 1730   Sepsis Labs: @LABRCNTIP (procalcitonin:4,lacticacidven:4)  )No results found for this or any previous visit (from the past 240 hour(s)).       Radiology Studies: No results found.      Scheduled Meds:  arformoterol  15 mcg Nebulization BID   baclofen  5 mg Per Tube TID   budesonide  0.5 mg Nebulization BID   chlorhexidine gluconate (MEDLINE KIT)  15 mL Mouth Rinse BID   Chlorhexidine Gluconate Cloth  6 each Topical Q2000   clonazePAM  1 mg Per Tube BID   enoxaparin (LOVENOX) injection  0.5 mg/kg Subcutaneous Q24H   famotidine  20 mg Per Tube BID   feeding supplement (PROSource TF)  45 mL Per Tube BID   fiber  1 packet Per Tube BID   free water  200 mL Per Tube Q4H   Gerhardt's butt cream   Topical BID   guaiFENesin  5 mL Per Tube Q6H   hydrocortisone  cream   Topical TID   insulin aspart  0-15 Units Subcutaneous Q4H   insulin glargine-yfgn  10 Units Subcutaneous QHS   levETIRAcetam  2,500 mg Per Tube BID   loratadine  10 mg Per Tube Daily   mouth rinse  15 mL Mouth Rinse 10 times per day   metoprolol tartrate  25 mg Per Tube BID   montelukast  10 mg Per Tube Daily   revefenacin  175 mcg Nebulization Daily   tiZANidine  2 mg Per Tube QHS   valproic acid  1,000 mg Per Tube Q12H   Continuous Infusions:  sodium chloride Stopped (01/01/21 2232)   feeding supplement (JEVITY 1.5 CAL/FIBER) 1,000 mL (01/14/21 0628)     LOS: 105 days    Time spent: 15  minutes    [name], MD [facility] 01/14/2021, 9:58 AM     Please page though [place] or Epic secure chat:  For [employer], Adult nurse

## 2021-01-15 DIAGNOSIS — A419 Sepsis, unspecified organism: Secondary | ICD-10-CM | POA: Diagnosis not present

## 2021-01-15 DIAGNOSIS — J9621 Acute and chronic respiratory failure with hypoxia: Secondary | ICD-10-CM | POA: Diagnosis not present

## 2021-01-15 DIAGNOSIS — J9601 Acute respiratory failure with hypoxia: Secondary | ICD-10-CM | POA: Diagnosis not present

## 2021-01-15 LAB — GLUCOSE, CAPILLARY
Glucose-Capillary: 107 mg/dL — ABNORMAL HIGH (ref 70–99)
Glucose-Capillary: 122 mg/dL — ABNORMAL HIGH (ref 70–99)
Glucose-Capillary: 129 mg/dL — ABNORMAL HIGH (ref 70–99)
Glucose-Capillary: 130 mg/dL — ABNORMAL HIGH (ref 70–99)
Glucose-Capillary: 134 mg/dL — ABNORMAL HIGH (ref 70–99)
Glucose-Capillary: 156 mg/dL — ABNORMAL HIGH (ref 70–99)
Glucose-Capillary: 99 mg/dL (ref 70–99)

## 2021-01-15 NOTE — Progress Notes (Signed)
Patient monitor was signaling ST elevation in 2 leads. EKG was completed with no remarkable findings except for non specific t wave abnormality. Patient pupils were more pinpoint than previous assessment and all was relayed to [facility] staff at 0100. No further concerns.

## 2021-01-15 NOTE — Progress Notes (Signed)
NAME:  Sonia Drake, MRN:  [id number], DOB:  07/19/1955, LOS: 106 ADMISSION DATE:  10-23-2020, CONSULTATION DATE:  10/04/2020 REFERRING MD:  Thedore Mins, CHIEF COMPLAINT:  Respiratory Failure   History of Present Illness:  Patient is a 65 years old female with history of asthma/COPD, congestive heart failure, cardiac arrest with PEA, chronic respiratory failure on permanent trach at 6 L/min, type 2 diabetes mellitus, GERD, seizures and severe hypoxic ischemic encephalopathy presented to the hospital with respiratory distress from skilled nursing facility.  Patient was noted to have aspiration pneumonitis and was put on mechanical ventilation.  She also underwent bronchoscopy on 6/16 which showed collapse of her trachea so tracheostomy was subsequently changed.  Currently on ventilator support through tracheostomy.  Patient has poor overall prognosis due to seizure-like activity during hospitalization.   Family wishes to give patient a full 90 days from date of admission to see if there is any improvement.  This time frame was based on patient's advanced directive.  Pertinent  Medical History   Past Medical History:  Diagnosis Date   Arthritis    "right knee; left hip" (04/07/2017)   Asthma    CHF (congestive heart failure) (HCC)    reported treatment for fluid overload in the setting of asthma exacerbation ~ 1998   COPD (chronic obstructive pulmonary disease) (HCC)    patient states she does not have COPD but was treated for it.    Depression    "from chemical imbalance" (04/07/2017)   Dyspnea    Dysrhythmia    occasional skipped beats   GERD (gastroesophageal reflux disease)    On home oxygen therapy    "I sleep with 2L" (04/07/2017)   Pneumonia    "several times" (04/07/2017)   Seizures (HCC) 2003 X 1   unknown cause     Significant Hospital Events: Including procedures, antibiotic start and stop dates in addition to other pertinent events   6/12 admitted from skilled nursing facility  after aspiration event and copious secretions per tracheostomy 6/13 appears comfortable currently on 35% ATC. No distress. WBC ct trending down. No events overnight growing GPC clusters in two of two cultures  6/16 increased work of breathing, upper airway noise, abdominal muscle use.  Bronchoscopy performed as trach exchange cuffless #8 Shiley XLT showed dynamic airway collapse, tracheal collapse. 6/16 continued respiratory distress moved to ICU, changed to cuffed #6 Shiley XLT, placed on MV with some improved comfort 6/12 cefepime > 6/14, 6/18 6/12 vanc > 6/14, 6/18 6/23 mucus plugging, severe vent dyssynchrony 6/24-25 intermittent jerking, seizures on EEG 6/27 weaned off propofol gtt 6/28 briefly tolerated trach collar in AM 6/29 tolerated trach collar in AM; no further seizure like activity; working with CM/SW for vent-SNF 7/1 Weaning on CPAP/ PS 10/5, CM SW working on [employer], NO seizure activity, secretions are not an issue per nursing 7/11 trach changed to #8 Bivona with air cuff, much improved. 7/12 TCT 12 hours; rested on Vent overnight 7/13 on TCT 7/23 Trach collar tolerated 7/24 Full vent support 7/26>> Remains on full vent support this am, no issues with secretions per nursing 8/5 occluding trach with dynamic collapse: seen on bronchoscopy 8/6 called back emergently to bedside for tracheostomy occlusion: emergent bronch, bivona trach migrated back 2cm, improved with advancing to 2cm above carina 8/8 collapse caused hypoxia. Improved with repositioning  8/9 afternoon had another episode of tracheal collapse and occlusion of trach requiring BVM, sedation. Family produced paperwork which seems to have conflicting information about pts  wishes  8/11: pt continues to have multiple daily episodes of tracheal collapse/mucous plugging req lavage, BVM. 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  Neuro consulted 8/30 overnight , BP drop which responded to fluid bolus 9/2 Continues to have labile BP, now midodrine prn 9/14 Appreciate ethics review of advance directives. Support ethics recommendation regarding patient autonomy  by following her wishes as she outlined.   Interim History / Subjective:  Remains on full vent support, no improvement in neurological exam. Trach intact.  Goals of care conversations reviewed with plans for compassionate wean from ventilator tentatively on Wednesday  Objective   Blood pressure 113/76, pulse 77, temperature 97.7 F (36.5 C), temperature source Oral, resp. rate 15, height 5\' 2"  (1.575 m), weight 90.5 kg, SpO2 100 %.    Vent Mode: PRVC FiO2 (%):  [30 %] 30 % Set Rate:  [16 bmp] 16 bmp Vt Set:  [400 mL] 400 mL PEEP:  [5 cmH20] 5 cmH20 Plateau Pressure:  [17 cmH20] 17 cmH20   Intake/Output Summary (Last 24 hours) at 01/15/2021 01/17/2021 Last data filed at 01/15/2021 0800 Gross per 24 hour  Intake 2085 ml  Output 2525 ml  Net -440 ml    Filed Weights   01/12/21 0500 01/13/21 0454 01/14/21 0500  Weight: 89.1 kg 90.5 kg 90.5 kg    Examination: General:  Adult female, vented with trach, unresponsive HEENT: MM pink/moist, anicteric, atraumatic, trach C/D/I Neuro:  PERRL, + gag, + cough present, does not interact CV: S1S2, NSR, no m/r/g appreciated PULM:  Lungs are clear to auscultation, symmetric expansion, thick white secretions via trach  GI: soft, bsx4 active, non distended   Extremities: warm/dry, no edema, capillary refill less than 3 seconds  Skin: no rashes or lesions   Assessment & Plan:  Anoxic encephalopathy / anoxic brain injury w/ PVS Acute on chronic respiratory failure w hypercarbia and hypoxia  Ventilator dependence  Severe tracheobronchomalacia Tracheostomy dependent Hx COPD  Dynamic airway collapse --Continue full vent support, not a candidate for weaning due to severe tracheobronchomalacia and anoxic injury    Plan -Continue current full ventilator support -Continue alformoterol, budesonide, revefenacin, montelukast, loratadine, and famotidine  -VAP bundle -Per EMR, proceeding with compassionate wean from the ventilator tentatively Wed.   All other interventions per hospitalist team  PCCM will follow weekly  Best Practice (right click and "Reselect all SmartList Selections" daily)  Per primary  Labs   CBC: No results for input(s): WBC, NEUTROABS, HGB, HCT, MCV, PLT in the last 168 hours.   Basic Metabolic Panel: No results for input(s): NA, K, CL, CO2, GLUCOSE, BUN, CREATININE, CALCIUM, MG, PHOS in the last 168 hours.  GFR: Estimated Creatinine Clearance: 71.4 mL/min (A) (by C-G formula based on SCr of 0.42 mg/dL (L)). No results for input(s): PROCALCITON, WBC, LATICACIDVEN in the last 168 hours.   Liver Function Tests: No results for input(s): AST, ALT, ALKPHOS, BILITOT, PROT, ALBUMIN in the last 168 hours. No results for input(s): LIPASE, AMYLASE in the last 168 hours. No results for input(s): AMMONIA in the last 168 hours.  ABG    Component Value Date/Time   PHART 7.414 11/20/2020 1253   PCO2ART 52.3 (H) 11/20/2020 1253   PO2ART 127 (H) 11/20/2020 1253   HCO3 33.6 (H) 11/20/2020 1253   TCO2 35 (H) 11/20/2020 1253   O2SAT 99.0 11/20/2020 1253     Coagulation Profile: No results for input(s):  INR, PROTIME in the last 168 hours.  Cardiac Enzymes: No results for input(s): CKTOTAL, CKMB, CKMBINDEX, TROPONINI in the last 168 hours.  HbA1C: Hgb A1c MFr Bld  Date/Time Value Ref Range Status  10/11/2020 06:31 PM 7.0 (H) 4.8 - 5.6 % Final    Comment:    (NOTE)         Prediabetes: 5.7 - 6.4         Diabetes: >6.4         Glycemic control for adults with diabetes: <7.0   08/30/2019 04:53 AM 6.5 (H) 4.8 - 5.6 % Final    Comment:    (NOTE) Pre diabetes:          5.7%-6.4% Diabetes:              >6.4% Glycemic control for   <7.0% adults with diabetes      CBG: Recent Labs  Lab 01/14/21 1508 01/14/21 2014 01/15/21 0029 01/15/21 0435 01/15/21 0710  GLUCAP 113* 147* 122* 156* 107*     Critical care time: NA    Earney Hamburg, ACNP [facility]  01/15/2021 , 9:05 AM  Please see [email or web address] for pager details  If no response, please call [phone] After hours, please call Elink at [phone]

## 2021-01-15 NOTE — Progress Notes (Signed)
Sonia Drake    Sonia Drake  AQT:[id number] DOB: [birth year] DOA: 10/10/2020 PCP: Garwin Brothers, MD      Brief Narrative:  Sonia Drake is a 65 y.o. 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  Sonia Drake distress was ultimately found to be due to her baseline severe tracheobronchomalacia and asthma.         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  Medication management as above.   Family Communication: We will call Sonia Drake later today.          Subjective: No face twitching like yesterday, no generalized tonic-clonic seizures, no fever, respiratory distress  Objective: Vitals:   01/15/21 1200 01/15/21 1255 01/15/21 1300 01/15/21 1400  BP: 92/60 120/85 112/69 116/81  Pulse: 65 72 70 70  Resp: 16 16 15 15   Temp:      TempSrc:      SpO2: 100% 100% 100% 100%  Weight:      Height:        Intake/Output Summary (Last 24 hours) at 01/15/2021 1425 Last data filed at 01/15/2021  1400 Gross per 24 hour  Intake 2930 ml  Output 2900 ml  Net 30 ml   Filed Weights   01/12/21 0500 01/13/21 0454 01/14/21 0500  Weight: 89.1 kg 90.5 kg 90.5 kg    Examination: General appearance: Adult female, intubated, remains unresponsive HEENT:     Skin:   Cardiac: RRR, no murmurs, mild nonpitting edema Respiratory: Mechanically ventilated, coarse wheezing bilaterally, no rales Abdomen: Abdomen is palpation, no rigidity, no masses Neuro: Unchanged from yesterday, does not withdraw from noxious stimuli, no spontaneous verbalizations, no spontaneous movements, does not track, there amblyopic    Psych: Unresponsive    Data Reviewed: I have personally reviewed following labs and imaging studies:  CBC: No results for input(s): WBC, NEUTROABS, HGB, HCT, MCV, PLT in the last 168 hours.  Basic Metabolic Panel: No results for input(s): NA, K, CL, CO2, GLUCOSE, BUN, CREATININE, CALCIUM, MG, PHOS in the last 168 hours.  GFR: Estimated Creatinine Clearance: 71.4 mL/min (A) (by C-G formula based on SCr of 0.42 mg/dL (L)). Liver Function Tests: No results for input(s): AST, ALT, ALKPHOS, BILITOT, PROT, ALBUMIN in the last 168 hours. No results for input(s): LIPASE, AMYLASE in the last 168 hours. No results for input(s): AMMONIA in the last 168 hours. Coagulation Profile: No results for input(s): INR, PROTIME in the last 168 hours. Cardiac Enzymes: No results for input(s): CKTOTAL, CKMB, CKMBINDEX, TROPONINI in the last 168 hours. BNP (last 3 results) No results for input(s): PROBNP in the last 8760 hours. HbA1C: No results for input(s):  HGBA1C in the last 72 hours. CBG: Recent Labs  Lab 01/14/21 2014 01/15/21 0029 01/15/21 0435 01/15/21 0710 01/15/21 1117  GLUCAP 147* 122* 156* 107* 134*   Lipid Profile: No results for input(s): CHOL, HDL, LDLCALC, TRIG, CHOLHDL, LDLDIRECT in the last 72 hours. Thyroid Function Tests: No results for input(s): TSH, T4TOTAL, FREET4,  T3FREE, THYROIDAB in the last 72 hours. Anemia Panel: No results for input(s): VITAMINB12, FOLATE, FERRITIN, TIBC, IRON, RETICCTPCT in the last 72 hours. Urine analysis:    Component Value Date/Time   COLORURINE YELLOW 12/13/2020 1730   APPEARANCEUR CLEAR 12/13/2020 1730   LABSPEC 1.010 12/13/2020 1730   PHURINE 8.0 12/13/2020 1730   GLUCOSEU NEGATIVE 12/13/2020 1730   HGBUR NEGATIVE 12/13/2020 1730   BILIRUBINUR NEGATIVE 12/13/2020 1730   KETONESUR NEGATIVE 12/13/2020 1730   PROTEINUR NEGATIVE 12/13/2020 1730   UROBILINOGEN 0.2 07/03/2014 1456   NITRITE NEGATIVE 12/13/2020 1730   LEUKOCYTESUR NEGATIVE 12/13/2020 1730   Sepsis Labs: @LABRCNTIP (procalcitonin:4,lacticacidven:4)  )No results found for this or any previous visit (from the past 240 hour(s)).       Radiology Studies: No results found.      Scheduled Meds:  arformoterol  15 mcg Nebulization BID   baclofen  5 mg Per Tube TID   budesonide  0.5 mg Nebulization BID   chlorhexidine gluconate (MEDLINE KIT)  15 mL Mouth Rinse BID   Chlorhexidine Gluconate Cloth  6 each Topical Q2000   clonazePAM  1 mg Per Tube BID   enoxaparin (LOVENOX) injection  0.5 mg/kg Subcutaneous Q24H   famotidine  20 mg Per Tube BID   feeding supplement (PROSource TF)  45 mL Per Tube BID   fiber  1 packet Per Tube BID   free water  200 mL Per Tube Q4H   Gerhardt's butt cream   Topical BID   guaiFENesin  5 mL Per Tube Q6H   hydrocortisone cream   Topical TID   insulin aspart  0-15 Units Subcutaneous Q4H   insulin glargine-yfgn  10 Units Subcutaneous QHS   levETIRAcetam  2,500 mg Per Tube BID   loratadine  10 mg Per Tube Daily   mouth rinse  15 mL Mouth Rinse 10 times per day   metoprolol tartrate  25 mg Per Tube BID   montelukast  10 mg Per Tube Daily   revefenacin  175 mcg Nebulization Daily   tiZANidine  2 mg Per Tube QHS   valproic acid  1,000 mg Per Tube Q12H   Continuous Infusions:  sodium chloride Stopped (01/01/21 2232)    feeding supplement (JEVITY 1.5 CAL/FIBER) 1,000 mL (01/15/21 1006)     LOS: 106 days    Time spent: 15  minutes    [name], MD [facility] 01/15/2021, 2:25 PM     Please page though [place] or Epic secure chat:  For [employer], Adult nurse

## 2021-01-16 DIAGNOSIS — J9621 Acute and chronic respiratory failure with hypoxia: Secondary | ICD-10-CM | POA: Diagnosis not present

## 2021-01-16 DIAGNOSIS — J9601 Acute respiratory failure with hypoxia: Secondary | ICD-10-CM | POA: Diagnosis not present

## 2021-01-16 DIAGNOSIS — A419 Sepsis, unspecified organism: Secondary | ICD-10-CM | POA: Diagnosis not present

## 2021-01-16 LAB — GLUCOSE, CAPILLARY
Glucose-Capillary: 133 mg/dL — ABNORMAL HIGH (ref 70–99)
Glucose-Capillary: 113 mg/dL — ABNORMAL HIGH (ref 70–99)
Glucose-Capillary: 93 mg/dL (ref 70–99)
Glucose-Capillary: 112 mg/dL — ABNORMAL HIGH (ref 70–99)
Glucose-Capillary: 111 mg/dL — ABNORMAL HIGH (ref 70–99)

## 2021-01-16 NOTE — Progress Notes (Signed)
Patient with inc WOB, HR 120s (increased) and BP increased. At 1830.  Patient suctioned with moderate tan secretions, and repositioned to back with HOB 30.  RT called to assess - prn albuterol given. Prn Ativan given. Patient slowly with less WOB, and BP trending back to baseline. Remains tachycardic at 120. Will monitor closely.

## 2021-01-16 NOTE — Progress Notes (Signed)
Patient found to have and increase work of breathing during change of shift. Lung sounds course and diminished. Breaths labored. Patients HR up in 130's and BP elevated. Patient appearing in distress. Ventilator beeping stating decreased volume. Ativan given by day nurse and respiratory called. Will continue to  monitor at this time.

## 2021-01-16 NOTE — Progress Notes (Signed)
[facility] PROGRESS NOTE    Sonia Drake  MHW:[id number] DOB: 18-Jun-1955 DOA: 09/29/2020 PCP: Garwin Brothers, MD      Brief Narrative:  Sonia Drake is a 65 y.o. 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  [name] distress was ultimately found to be due to her baseline severe tracheobronchomalacia and asthma.         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  Medication management as above.   Family Communication: Spoke with patient on [place] did not get his bone modified, he will not be able to be released from jail to attend his mother.  We will proceed with extubation without him, family have not decided when they can be present at the bedside, for now I will respect this and we will wait.          Subjective: No fever, respiratory distress, generalized tonic-clonic seizures.  Objective: Vitals:   01/16/21 1101 01/16/21 1200 01/16/21 1202 01/16/21 1300  BP:  (!) 172/84 (!) 159/69 131/71   Pulse:  86 88 85  Resp:  19 19 18   Temp: 97.8 F (36.6 C)     TempSrc: Axillary     SpO2:  100% 100% 100%  Weight:      Height:        Intake/Output Summary (Last 24 hours) at 01/16/2021 1356 Last data filed at 01/16/2021 1317 Gross per 24 hour  Intake 2035 ml  Output 1550 ml  Net 485 ml   Filed Weights   01/13/21 0454 01/14/21 0500 01/16/21 0500  Weight: 90.5 kg 90.5 kg 90.7 kg    Examination: General appearance: Adult female, intubated, remains unresponsive HEENT:     Skin:   Cardiac: RRR, no murmurs, mild nonpitting edema Respiratory: Mechanically ventilated, coarse wheezing bilaterally, no rales Abdomen: Abdomen soft, no tenderness to palpation, no rigidity, no masses Neuro: Unchanged from yesterday, does not withdraw from noxious stimuli, no spontaneous movements, no spontaneous verbalizations, eyes do not track, amblyopia noted    Psych: Unresponsive    Data Reviewed: I have personally reviewed following labs and imaging studies:  CBC: No results for input(s): WBC, NEUTROABS, HGB, HCT, MCV, PLT in the last 168 hours.  Basic Metabolic Panel: No results for input(s): NA, K, CL, CO2, GLUCOSE, BUN, CREATININE, CALCIUM, MG, PHOS in the last 168 hours.  GFR: Estimated Creatinine Clearance: 71.4 mL/min (A) (by C-G formula based on SCr of 0.42 mg/dL (L)). Liver Function Tests: No results for input(s): AST, ALT, ALKPHOS, BILITOT, PROT, ALBUMIN in the last 168 hours. No results for input(s): LIPASE, AMYLASE in the last 168 hours. No results for input(s): AMMONIA  in the last 168 hours. Coagulation Profile: No results for input(s): INR, PROTIME in the last 168 hours. Cardiac Enzymes: No results for input(s): CKTOTAL, CKMB, CKMBINDEX, TROPONINI in the last 168 hours. BNP (last 3 results) No results for input(s): PROBNP in the last 8760 hours. HbA1C: No results for input(s): HGBA1C in the last 72 hours. CBG: Recent Labs  Lab 01/15/21 1943 01/15/21 2319  01/16/21 0356 01/16/21 0714 01/16/21 1100  GLUCAP 130* 129* 133* 113* 93   Lipid Profile: No results for input(s): CHOL, HDL, LDLCALC, TRIG, CHOLHDL, LDLDIRECT in the last 72 hours. Thyroid Function Tests: No results for input(s): TSH, T4TOTAL, FREET4, T3FREE, THYROIDAB in the last 72 hours. Anemia Panel: No results for input(s): VITAMINB12, FOLATE, FERRITIN, TIBC, IRON, RETICCTPCT in the last 72 hours. Urine analysis:    Component Value Date/Time   COLORURINE YELLOW 12/13/2020 1730   APPEARANCEUR CLEAR 12/13/2020 1730   LABSPEC 1.010 12/13/2020 1730   PHURINE 8.0 12/13/2020 1730   GLUCOSEU NEGATIVE 12/13/2020 1730   HGBUR NEGATIVE 12/13/2020 1730   BILIRUBINUR NEGATIVE 12/13/2020 1730   KETONESUR NEGATIVE 12/13/2020 1730   PROTEINUR NEGATIVE 12/13/2020 1730   UROBILINOGEN 0.2 07/03/2014 1456   NITRITE NEGATIVE 12/13/2020 1730   LEUKOCYTESUR NEGATIVE 12/13/2020 1730   Sepsis Labs: @LABRCNTIP (procalcitonin:4,lacticacidven:4)  )No results found for this or any previous visit (from the past 240 hour(s)).       Radiology Studies: No results found.      Scheduled Meds:  arformoterol  15 mcg Nebulization BID   baclofen  5 mg Per Tube TID   budesonide  0.5 mg Nebulization BID   chlorhexidine gluconate (MEDLINE KIT)  15 mL Mouth Rinse BID   Chlorhexidine Gluconate Cloth  6 each Topical Q2000   clonazePAM  1 mg Per Tube BID   enoxaparin (LOVENOX) injection  0.5 mg/kg Subcutaneous Q24H   famotidine  20 mg Per Tube BID   feeding supplement (PROSource TF)  45 mL Per Tube BID   fiber  1 packet Per Tube BID   free water  200 mL Per Tube Q4H   Gerhardt's butt cream   Topical BID   guaiFENesin  5 mL Per Tube Q6H   hydrocortisone cream   Topical TID   insulin aspart  0-15 Units Subcutaneous Q4H   insulin glargine-yfgn  10 Units Subcutaneous QHS   levETIRAcetam  2,500 mg Per Tube BID   loratadine  10 mg Per Tube Daily   mouth rinse  15 mL Mouth Rinse 10 times per day    metoprolol tartrate  25 mg Per Tube BID   montelukast  10 mg Per Tube Daily   revefenacin  175 mcg Nebulization Daily   tiZANidine  2 mg Per Tube QHS   valproic acid  1,000 mg Per Tube Q12H   Continuous Infusions:  sodium chloride Stopped (01/01/21 2232)   feeding supplement (JEVITY 1.5 CAL/FIBER) 1,000 mL (01/16/21 1215)     LOS: 107 days    Time spent: 15  minutes    [name], MD [facility] 01/16/2021, 1:56 PM     Please page though [place] or Epic secure chat:  For [employer], Adult nurse

## 2021-01-16 NOTE — Progress Notes (Signed)
Nutrition Follow-up  DOCUMENTATION CODES:   Not applicable  INTERVENTION:   Continue tube feeds via PEG: - Jevity 1.5 @ 45 ml/hr (1080 ml/day) - ProSource TF 45 ml BID - Free water flushes of 200 ml q 4 hours   Tube feeding regimen provides 1700 kcal, 91 grams of protein, and 821 ml of H2O.   Total free water with flushes: 2280 ml  NUTRITION DIAGNOSIS:   Increased nutrient needs related to wound healing as evidenced by estimated needs.  Ongoing, being addressed via TF  GOAL:   Patient will meet greater than or equal to 90% of their needs  Met via TF  MONITOR:   Vent status, Labs, Weight trends, TF tolerance, Skin, I & O's  REASON FOR ASSESSMENT:   Consult Enteral/tube feeding initiation and management  ASSESSMENT:   Sonia Drake is a 65 y.o. female with medical history significant of asthma/COPD, CHF, cardiac arrest with PEA/CPR with chronic respiratory failure on permanent trach with supplemental oxygen at 6L, type 2 DM, GERD, seizures and severe hypoxic-ischemic encephalopathy who presented to ER via EMS for respiratory distress from SNF.  Discussed pt with RN and during ICU rounds. Plan is for compassionate withdrawal of care when family able to gather at bedside. Pt tolerating current TF without issue.  Current TF: Jevity 1.5 @ 45 ml/hr, ProSource TF 45 ml BID, free water flushes 200 ml q 4 hours  Admit weight: 88.4 kg Current weight: 90.7 kg  Pt continues to have mild pitting generalized edema.  Patient remains on ventilator support via trach MV: 8.1 L/min Temp (24hrs), Avg:98 F (36.7 C), Min:97.7 F (36.5 C), Max:98.4 F (36.9 C)  Medications reviewed and include: pepcid, nutrisource fiber BID, SSI q 4 hours, semglee 10 units daily  Labs reviewed. CBG's: 93-133 x 24 hours  UOP: 1900 ml x 24 hours  Diet Order:   Diet Order             Diet NPO time specified  Diet effective now                   EDUCATION NEEDS:   No education  needs have been identified at this time  Skin:  Skin Assessment: Skin Integrity Issues: Other: MASD to buttocks  Last BM:  01/16/21 medium type 6  Height:   Ht Readings from Last 1 Encounters:  12/25/20 5' 2"  (1.575 m)    Weight:   Wt Readings from Last 1 Encounters:  01/16/21 90.7 kg    Ideal Body Weight:  45.5 kg  BMI:  Body mass index is 36.57 kg/m.  Estimated Nutritional Needs:   Kcal:  1600-1800  Protein:  90-105 grams  Fluid:  > 1.6 L    Gustavus Bryant, MS, RD, LDN Inpatient Clinical Dietitian Please see AMiON for contact information.

## 2021-01-17 DIAGNOSIS — A419 Sepsis, unspecified organism: Secondary | ICD-10-CM | POA: Diagnosis not present

## 2021-01-17 DIAGNOSIS — J9621 Acute and chronic respiratory failure with hypoxia: Secondary | ICD-10-CM | POA: Diagnosis not present

## 2021-01-17 DIAGNOSIS — J9601 Acute respiratory failure with hypoxia: Secondary | ICD-10-CM | POA: Diagnosis not present

## 2021-01-17 LAB — GLUCOSE, CAPILLARY
Glucose-Capillary: 134 mg/dL — ABNORMAL HIGH (ref 70–99)
Glucose-Capillary: 113 mg/dL — ABNORMAL HIGH (ref 70–99)
Glucose-Capillary: 124 mg/dL — ABNORMAL HIGH (ref 70–99)
Glucose-Capillary: 162 mg/dL — ABNORMAL HIGH (ref 70–99)
Glucose-Capillary: 124 mg/dL — ABNORMAL HIGH (ref 70–99)
Glucose-Capillary: 157 mg/dL — ABNORMAL HIGH (ref 70–99)

## 2021-01-17 NOTE — Progress Notes (Signed)
[facility] PROGRESS NOTE    Sonia Drake  [id number]:[id number] DOB: 11-25-1955 DOA: 10/11/2020 PCP: Garwin Brothers, MD      Brief Narrative:  [name] is a 65 y.o. F with anoxic brain injury after out of hospital PEA arrest August 2021, persistent vegetative state with tracheostomy dependence who was sent in for respiratory distress.  Sonia Drake distress was ultimately found to be due to her baseline severe tracheobronchomalacia and asthma.         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 Severe persistent asthma - Continue arformoterol, budesonide, revefenacin, montelukast, loratadine - Continue full mechanical ventilation  Persistent vegetative state due to anoxic brain injury - Continue baclofen and low-dose tizanidine - Continue tube feeds  Sinus tachycardia Hypertension Blood pressure controlled - Continue metoprolol  Seizure disorder Facial twitching noted - Continue Keppra, Klonopin, Depakote  Type 2 diabetes Glucose normal - Continue sliding scale corrections           Disposition: Status is: Inpatient  Remains inpatient appropriate because:Unsafe d/c plan  Dispo: The patient is from: SNF       Level of care: ICU       MDM: The below labs and imaging reports were reviewed and summarized above.  Medication management as above.   Family Communication: Spoke to Sonia Drake at [employer].  They had previously expressed understanding of [name]'s terminal state, and I recommended they gather family to visit [name].  Today, they stated that they did not understand that extubation was imminent (as I had not specified a date) and they expressed that they found the idea of a deadline objectionable.  Katrina Stack, good faith efforts have been made to achieve shared decision making and family disagree with medical recommendations to compassionately wean.     In  the current circumstances, our pulmonary colleagues are unanimous in their feeling that she cannot be weaned to trach collar safely due to her advanced pulmonary disease preceding her cardiac arrest.  As a result, extensive effort to find a suitable long term care center for her to reside in have failed, as they recognize her advanced lung disease and that it is not safe to ventilate her outside an acute care setting.  Her medical team is unanimous in feeling that her current treatment cannot improve quality of life, serves no therapeutic or restorative effect, and in fact may cause pain and discomfort.  Ethics have been consulted and offered that even if the patient's ambiguous advanced directive does not clearly outline that her wish would have been compassionate weaning at this time, withdrawal of ventilator and feeding tube is ethically justified.  We have made attempts to transfer (as outlined in [facility] notes), that have failed.   I will reach out to the [facility] to assist me to set a deadline for withdrawing ventilator and feeding tube and allowing an in-hospital death or transfer to residential hospice or home.          Subjective: No fever, respiratory distress, generalized tonic-clonic seizures.  Objective: Vitals:   01/17/21 1600 01/17/21 1700 01/17/21 1735 01/17/21 1800  BP: (!) 149/71 (!) 151/87  (!) 164/79  Pulse: (!) 106 (!) 113 100 (!) 108  Resp: 18 17 16 18   Temp:      TempSrc:      SpO2: 100% 99% 99% 99%  Weight:      Height:        Intake/Output Summary (Last 24  hours) at 01/17/2021 1903 Last data filed at 01/17/2021 0600 Gross per 24 hour  Intake 735 ml  Output 500 ml  Net 235 ml   Filed Weights   01/14/21 0500 01/16/21 0500 01/17/21 0500  Weight: 90.5 kg 90.7 kg 90.3 kg    Examination: General appearance: Adult female, intubated, remains unresponsive HEENT:     Skin:   Cardiac: RRR, no murmurs, mild nonpitting edema Respiratory: Mechanically ventilated, coarse  wheezing bilaterally, no rales Abdomen: Abdomen soft, no tenderness to palpation, no rigidity, no masses Neuro: Unchanged from yesterday, does not withdraw from noxious stimuli, no spontaneous movements, no spontaneous verbalizations, eyes do not track, amblyopia noted    Psych: Unresponsive    Data Reviewed: I have personally reviewed following labs and imaging studies:  CBC: No results for input(s): WBC, NEUTROABS, HGB, HCT, MCV, PLT in the last 168 hours.  Basic Metabolic Panel: No results for input(s): NA, K, CL, CO2, GLUCOSE, BUN, CREATININE, CALCIUM, MG, PHOS in the last 168 hours.  GFR: Estimated Creatinine Clearance: 71.3 mL/min (A) (by C-G formula based on SCr of 0.42 mg/dL (L)). Liver Function Tests: No results for input(s): AST, ALT, ALKPHOS, BILITOT, PROT, ALBUMIN in the last 168 hours. No results for input(s): LIPASE, AMYLASE in the last 168 hours. No results for input(s): AMMONIA in the last 168 hours. Coagulation Profile: No results for input(s): INR, PROTIME in the last 168 hours. Cardiac Enzymes: No results for input(s): CKTOTAL, CKMB, CKMBINDEX, TROPONINI in the last 168 hours. BNP (last 3 results) No results for input(s): PROBNP in the last 8760 hours. HbA1C: No results for input(s): HGBA1C in the last 72 hours. CBG: Recent Labs  Lab 01/16/21 2348 01/17/21 0324 01/17/21 0809 01/17/21 1133 01/17/21 1511  GLUCAP 113* 134* 124* 162* 124*   Lipid Profile: No results for input(s): CHOL, HDL, LDLCALC, TRIG, CHOLHDL, LDLDIRECT in the last 72 hours. Thyroid Function Tests: No results for input(s): TSH, T4TOTAL, FREET4, T3FREE, THYROIDAB in the last 72 hours. Anemia Panel: No results for input(s): VITAMINB12, FOLATE, FERRITIN, TIBC, IRON, RETICCTPCT in the last 72 hours. Urine analysis:    Component Value Date/Time   COLORURINE YELLOW 12/13/2020 1730   APPEARANCEUR CLEAR 12/13/2020 1730   LABSPEC 1.010 12/13/2020 1730   PHURINE 8.0 12/13/2020 1730    GLUCOSEU NEGATIVE 12/13/2020 1730   HGBUR NEGATIVE 12/13/2020 1730   BILIRUBINUR NEGATIVE 12/13/2020 1730   KETONESUR NEGATIVE 12/13/2020 1730   PROTEINUR NEGATIVE 12/13/2020 1730   UROBILINOGEN 0.2 07/03/2014 1456   NITRITE NEGATIVE 12/13/2020 1730   LEUKOCYTESUR NEGATIVE 12/13/2020 1730   Sepsis Labs: @LABRCNTIP (procalcitonin:4,lacticacidven:4)  )No results found for this or any previous visit (from the past 240 hour(s)).       Radiology Studies: No results found.      Scheduled Meds:  arformoterol  15 mcg Nebulization BID   baclofen  5 mg Per Tube TID   budesonide  0.5 mg Nebulization BID   chlorhexidine gluconate (MEDLINE KIT)  15 mL Mouth Rinse BID   Chlorhexidine Gluconate Cloth  6 each Topical Q2000   clonazePAM  1 mg Per Tube BID   enoxaparin (LOVENOX) injection  0.5 mg/kg Subcutaneous Q24H   famotidine  20 mg Per Tube BID   feeding supplement (PROSource TF)  45 mL Per Tube BID   fiber  1 packet Per Tube BID   free water  200 mL Per Tube Q4H   Gerhardt's butt cream   Topical BID   guaiFENesin  5 mL Per Tube Q6H  hydrocortisone cream   Topical TID   insulin aspart  0-15 Units Subcutaneous Q4H   insulin glargine-yfgn  10 Units Subcutaneous QHS   levETIRAcetam  2,500 mg Per Tube BID   loratadine  10 mg Per Tube Daily   mouth rinse  15 mL Mouth Rinse 10 times per day   metoprolol tartrate  25 mg Per Tube BID   montelukast  10 mg Per Tube Daily   revefenacin  175 mcg Nebulization Daily   tiZANidine  2 mg Per Tube QHS   valproic acid  1,000 mg Per Tube Q12H   Continuous Infusions:  sodium chloride Stopped (01/01/21 2232)   feeding supplement (JEVITY 1.5 CAL/FIBER) 1,000 mL (01/16/21 1215)     LOS: 108 days    Time spent: 95  minutes    [name], MD [facility] 01/17/2021, 7:03 PM     Please page though [place] or Epic secure chat:  For [employer], Adult nurse

## 2021-01-17 NOTE — Progress Notes (Signed)
Patient's daughter, [name], called ICU. Daughter states "what games are we playing... my family has not discussed this and does not want this" regarding plan of care moving toward to comfort care.   RN informed patient daughter that the Attending physician had just reached out via phone to discuss these goals of care in detail, as a follow up to the discussion that was had with [place] and sister yesterday via phone. MD secure chat sent, calling daughter from office line asap. RN reassured her that nothing has changed in the patients plan of care at this moment. Daughter still persistent that she does not want any changes to her mothers care.   [name] states that she does not want this attending , Danford MD, apart of the care team moving forward. She states also that she does not want him on the board of ethics either.   Phone call was ended with MD Danford forwarding through on daughters other line on phone.

## 2021-01-17 NOTE — Progress Notes (Addendum)
CSW spoke with staff member at [facility] to confirm patient's son is still [facility] the jail and he is.  CSW informed Dr. Maryfrances Bunnell of information.  CSW spoke with unit charge RN Selena Batten to discuss updates.  [name], MSW, LCSW Transitions of Care  Clinical Social Worker II [phone]

## 2021-01-18 DIAGNOSIS — A419 Sepsis, unspecified organism: Secondary | ICD-10-CM | POA: Diagnosis not present

## 2021-01-18 DIAGNOSIS — I10 Essential (primary) hypertension: Secondary | ICD-10-CM | POA: Diagnosis present

## 2021-01-18 DIAGNOSIS — J9601 Acute respiratory failure with hypoxia: Secondary | ICD-10-CM | POA: Diagnosis not present

## 2021-01-18 DIAGNOSIS — J9621 Acute and chronic respiratory failure with hypoxia: Secondary | ICD-10-CM | POA: Diagnosis not present

## 2021-01-18 LAB — GLUCOSE, CAPILLARY
Glucose-Capillary: 117 mg/dL — ABNORMAL HIGH (ref 70–99)
Glucose-Capillary: 119 mg/dL — ABNORMAL HIGH (ref 70–99)
Glucose-Capillary: 127 mg/dL — ABNORMAL HIGH (ref 70–99)
Glucose-Capillary: 129 mg/dL — ABNORMAL HIGH (ref 70–99)
Glucose-Capillary: 146 mg/dL — ABNORMAL HIGH (ref 70–99)
Glucose-Capillary: 147 mg/dL — ABNORMAL HIGH (ref 70–99)
Glucose-Capillary: 152 mg/dL — ABNORMAL HIGH (ref 70–99)
Glucose-Capillary: 154 mg/dL — ABNORMAL HIGH (ref 70–99)

## 2021-01-18 NOTE — Assessment & Plan Note (Signed)
Glucose controlled - Continue sliding scale corrections 

## 2021-01-18 NOTE — Progress Notes (Signed)
  Progress Note    Sonia Drake   [id number]  DOB: 03/03/1956  DOA: 10/14/2020     109 Date of Service: 01/18/2021     Subjective:  Afebrile.  No improvement in mentation.  Nonverbal.  No documented respiratory distress since yesterday requiring secretion suction.  Hospital Problems Ventilator dependence [facility]) Chronic respiratory failure due to severe persistent asthma and dynamic airway collapse due to tracheobronchomalacia  - Continue full mechanical ventilation  Asthma Severe persistent asthma - Continue formoterol, budesonide, revefenacin, montelukast, loratadine  Anoxic brain injury (HCC) Persistent vegetative state due to anoxic brain injury - Continue baclofen, low-dose tizanidine - Continue tube feeds  Essential hypertension Blood pressure labile, mostly controlled - Continue metoprolol  Type 2 diabetes mellitus without complication, with long-term current use of insulin (HCC) Glucose controlled - Continue sliding scale corrections  Seizures (HCC) No facial twitching today - Continue Keppra, Klonopin, Depakote        Family contact: Spoke with [name]/son by Zoom today.  He expressed understanding of the plan to extubate his mother, he will make effort to get out of jail sooner, but he understands that she may be extubated before he is released.  He understands her condition is terminal and expressed to me that we are doing what we should.  He will speak to his sister and cousin about this.           Objective Vital signs were reviewed and unremarkable.  Vitals:   01/18/21 1911 01/18/21 1915 01/18/21 1920 01/18/21 1929  BP:  114/88    Pulse:    88  Resp:    16  Temp:   98.4 F (36.9 C)   TempSrc:   Axillary   SpO2: 96%     Weight:      Height:       90.8 kg  Exam Physical Exam Constitutional:      Appearance: She is obese.     Interventions: She is intubated.  HENT:     Head: Normocephalic and atraumatic.     Nose: No nasal  deformity or rhinorrhea.  Eyes:     General: Lids are normal. No scleral icterus.    Conjunctiva/sclera:     Right eye: Right conjunctiva is not injected. No chemosis.    Left eye: Left conjunctiva is not injected. No chemosis. Cardiovascular:     Rate and Rhythm: Normal rate and regular rhythm.     Heart sounds: Normal heart sounds, S1 normal and S2 normal. No murmur heard. Pulmonary:     Effort: She is intubated.     Breath sounds: Decreased breath sounds and rhonchi present. No wheezing or rales.  Abdominal:     General: Abdomen is flat.     Palpations: Abdomen is soft.     Tenderness: There is no abdominal tenderness. There is no guarding.  Musculoskeletal:     Right lower leg: No edema.     Left lower leg: No edema.  Neurological:     Mental Status: She is unresponsive.     GCS: GCS eye subscore is 1. GCS verbal subscore is 1. GCS motor subscore is 1.      Labs / Other Information There are no new results to review at this time.    Time spent: 15 minutes [facility] 01/18/2021, 8:06 PM

## 2021-01-18 NOTE — Assessment & Plan Note (Signed)
Severe persistent asthma - Continue formoterol, budesonide, revefenacin, montelukast, loratadine 

## 2021-01-18 NOTE — Assessment & Plan Note (Signed)
Blood pressure labile, mostly controlled - Continue metoprolol

## 2021-01-18 NOTE — Assessment & Plan Note (Signed)
No facial twitching today - Continue Keppra, Klonopin, Depakote

## 2021-01-18 NOTE — Assessment & Plan Note (Signed)
Chronic respiratory failure due to severe persistent asthma and dynamic airway collapse due to tracheobronchomalacia  - Continue full mechanical ventilation 

## 2021-01-18 NOTE — Assessment & Plan Note (Signed)
Persistent vegetative state due to anoxic brain injury - Continue baclofen, low-dose tizanidine - Continue tube feeds 

## 2021-01-19 DIAGNOSIS — J9621 Acute and chronic respiratory failure with hypoxia: Secondary | ICD-10-CM | POA: Diagnosis not present

## 2021-01-19 DIAGNOSIS — J9601 Acute respiratory failure with hypoxia: Secondary | ICD-10-CM | POA: Diagnosis not present

## 2021-01-19 DIAGNOSIS — A419 Sepsis, unspecified organism: Secondary | ICD-10-CM | POA: Diagnosis not present

## 2021-01-19 LAB — GLUCOSE, CAPILLARY
Glucose-Capillary: 110 mg/dL — ABNORMAL HIGH (ref 70–99)
Glucose-Capillary: 127 mg/dL — ABNORMAL HIGH (ref 70–99)
Glucose-Capillary: 133 mg/dL — ABNORMAL HIGH (ref 70–99)
Glucose-Capillary: 137 mg/dL — ABNORMAL HIGH (ref 70–99)
Glucose-Capillary: 139 mg/dL — ABNORMAL HIGH (ref 70–99)
Glucose-Capillary: 173 mg/dL — ABNORMAL HIGH (ref 70–99)

## 2021-01-19 NOTE — Assessment & Plan Note (Signed)
Blood pressure labile, low speed now - Continue metoprolol, hold for SBP < 100

## 2021-01-19 NOTE — Assessment & Plan Note (Signed)
Persistent vegetative state due to anoxic brain injury - Continue baclofen, low-dose tizanidine - Continue tube feeds 

## 2021-01-19 NOTE — Progress Notes (Addendum)
CSW spoke with patient's niece and daughter to discuss hospice care, comfort care, futility policy, and previous placement attempts at [facility] and vent SNF's.  CSW to continue following with MD.  [name], MSW, LCSW Transitions of Care  Clinical Social Worker II [phone]

## 2021-01-19 NOTE — Assessment & Plan Note (Signed)
Glucose controlled - Continue sliding scale corrections 

## 2021-01-19 NOTE — Assessment & Plan Note (Signed)
Is twitching of the face tonight - Continue Keppra, Klonopin, Depakote

## 2021-01-19 NOTE — Assessment & Plan Note (Signed)
Severe persistent asthma - Continue formoterol, budesonide, revefenacin, montelukast, loratadine

## 2021-01-19 NOTE — Assessment & Plan Note (Signed)
Chronic respiratory failure due to severe persistent asthma and dynamic airway collapse due to tracheobronchomalacia  - Continue full mechanical ventilation 

## 2021-01-19 NOTE — Progress Notes (Signed)
  Progress Note    Sonia Drake   [id number]  DOB: [birth year]  DOA: 10/07/2020     110 Date of Service: 01/19/2021     Subjective:  No improvement in mentation.  No fever.  Nonverbal.  Hospital Problems Ventilator dependence [facility]) Chronic respiratory failure due to severe persistent asthma and dynamic airway collapse due to tracheobronchomalacia  - Continue full mechanical ventilation  Asthma Severe persistent asthma - Continue formoterol, budesonide, revefenacin, montelukast, loratadine  Anoxic brain injury (HCC) Persistent vegetative state due to anoxic brain injury - Continue baclofen, low-dose tizanidine - Continue tube feeds  Essential hypertension Blood pressure labile, low speed now - Continue metoprolol, hold for SBP < 100  Type 2 diabetes mellitus without complication, with long-term current use of insulin (HCC) Glucose controlled - Continue sliding scale corrections  Seizures (HCC) Is twitching of the face tonight - Continue Keppra, Klonopin, Depakote     Objective Vital signs were reviewed and unremarkable except for: Blood pressure: hypotension   Exam Physical Exam Constitutional:      Appearance: She is obese.     Interventions: She is intubated.  HENT:     Head: Normocephalic and atraumatic.     Nose: No nasal deformity or rhinorrhea.  Eyes:     General: Lids are normal. No scleral icterus.    Conjunctiva/sclera:     Right eye: Right conjunctiva is not injected. No chemosis.    Left eye: Left conjunctiva is not injected. No chemosis. Cardiovascular:     Rate and Rhythm: Normal rate and regular rhythm.     Heart sounds: Normal heart sounds, S1 normal and S2 normal. No murmur heard. Pulmonary:     Effort: She is intubated.     Breath sounds: Decreased breath sounds and rhonchi present. No wheezing or rales.  Abdominal:     General: Abdomen is flat.     Palpations: Abdomen is soft.     Tenderness: There is no abdominal tenderness.  There is no guarding.  Musculoskeletal:     Right lower leg: No edema.     Left lower leg: No edema.  Neurological:     Mental Status: She is unresponsive.     GCS: GCS eye subscore is 1. GCS verbal subscore is 1. GCS motor subscore is 1.     Labs / Other Information There are no new results to review at this time.  Spoke to daughter Sonia Drake and niece Sonia Drake.  I explained that, per the futility policy, I was setting deadline Monday for removing ventilator. Sonia Drake and Sonia Drake are aware.        Time spent: 25 minutes [facility] 01/19/2021, 7:22 PM

## 2021-01-20 DIAGNOSIS — J9621 Acute and chronic respiratory failure with hypoxia: Secondary | ICD-10-CM | POA: Diagnosis not present

## 2021-01-20 DIAGNOSIS — J9601 Acute respiratory failure with hypoxia: Secondary | ICD-10-CM | POA: Diagnosis not present

## 2021-01-20 DIAGNOSIS — A419 Sepsis, unspecified organism: Secondary | ICD-10-CM | POA: Diagnosis not present

## 2021-01-20 LAB — GLUCOSE, CAPILLARY
Glucose-Capillary: 131 mg/dL — ABNORMAL HIGH (ref 70–99)
Glucose-Capillary: 144 mg/dL — ABNORMAL HIGH (ref 70–99)
Glucose-Capillary: 156 mg/dL — ABNORMAL HIGH (ref 70–99)
Glucose-Capillary: 108 mg/dL — ABNORMAL HIGH (ref 70–99)
Glucose-Capillary: 164 mg/dL — ABNORMAL HIGH (ref 70–99)
Glucose-Capillary: 140 mg/dL — ABNORMAL HIGH (ref 70–99)

## 2021-01-20 MED ORDER — ORAL CARE MOUTH RINSE
15.0000 mL | Freq: Four times a day (QID) | OROMUCOSAL | Status: DC
  Administered 2021-01-20 – 2021-01-27 (×26): 15 mL via OROMUCOSAL

## 2021-01-20 NOTE — Progress Notes (Signed)
  Progress Note    Sonia Drake   [id number]  DOB: 28-Oct-1955  DOA: October 07, 2020     111 Date of Service: 01/20/2021     Subjective:  Facial twitching (her myoclonic seizures) have been noted.  No fever.  No improvemnt in mentation.    Hospital Problems Ventilator dependence [facility]) Chronic respiratory failure due to severe persistent asthma and dynamic airway collapse due to tracheobronchomalacia  - Continue full mechanical ventilation  Asthma Severe persistent asthma - Continue formoterol, budesonide, revefenacin, montelukast, loratadine  Anoxic brain injury (HCC) Persistent vegetative state due to anoxic brain injury - Continue baclofen, low-dose tizanidine - Continue tube feeds  Essential hypertension BP labile.  Not tolerating metoprolol in the last 48 hours - Hold metoprolol  Type 2 diabetes mellitus without complication, with long-term current use of insulin (HCC) Glucose controlled - Continue sliding scale corrections  Seizures (HCC) No facial twitching - Continue Keppra, Klonopin, Depakote     Objective   BP (!) 158/116   Pulse 100   Temp 98.4 F (36.9 C) (Axillary)   Resp (!) 27   Ht 5\' 2"  (1.575 m)   Wt 88.5 kg   SpO2 100%   BMI 35.69 kg/m    Exam Physical Exam Constitutional:      Appearance: She is obese.     Interventions: She is intubated.  HENT:     Head: Normocephalic and atraumatic.     Nose: No nasal deformity or rhinorrhea.  Eyes:     General: Lids are normal. No scleral icterus.    Conjunctiva/sclera:     Right eye: Right conjunctiva is not injected. No chemosis.    Left eye: Left conjunctiva is not injected. No chemosis. Cardiovascular:     Rate and Rhythm: Normal rate and regular rhythm.     Heart sounds: Normal heart sounds, S1 normal and S2 normal. No murmur heard. Pulmonary:     Effort: She is intubated.     Breath sounds: Decreased breath sounds and rhonchi present. No wheezing or rales.  Abdominal:     General:  Abdomen is flat.     Palpations: Abdomen is soft.     Tenderness: There is no abdominal tenderness. There is no guarding.  Musculoskeletal:     Right lower leg: No edema.     Left lower leg: No edema.  Neurological:     Mental Status: She is unresponsive.     GCS: GCS eye subscore is 1. GCS verbal subscore is 1. GCS motor subscore is 1.      Labs / Other Information There are no new results to review at this time.  Spoke to the patient's husband (from whom she is separated).  He related that he is gathering family to visit Mrs. and that from the standpoint of family visitation and attendance at her compassionate wean, it would be most reasonable to plan to take off the ventilator this Wednesday and that afternoon was better.  This is a reasonable request to allow family last visitation with the patient, and we confirmed that we would remove the ventilator on Wednesday Oct 5 at 4pm.  I spoke to Oct 7 and confirmed the same date and time.     Time spent: 35 minutes [facility] 01/20/2021, 4:57 PM

## 2021-01-20 NOTE — Assessment & Plan Note (Signed)
Glucose controlled - Continue sliding scale corrections 

## 2021-01-20 NOTE — Assessment & Plan Note (Signed)
Severe persistent asthma - Continue formoterol, budesonide, revefenacin, montelukast, loratadine 

## 2021-01-20 NOTE — Assessment & Plan Note (Signed)
Persistent vegetative state due to anoxic brain injury - Continue baclofen, low-dose tizanidine - Continue tube feeds

## 2021-01-20 NOTE — Assessment & Plan Note (Signed)
BP labile.  Not tolerating metoprolol in the last 48 hours - Hold metoprolol

## 2021-01-20 NOTE — Assessment & Plan Note (Signed)
Chronic respiratory failure due to severe persistent asthma and dynamic airway collapse due to tracheobronchomalacia  - Continue full mechanical ventilation 

## 2021-01-20 NOTE — Assessment & Plan Note (Addendum)
No facial twitching - Continue Keppra, Klonopin, Depakote 

## 2021-01-21 DIAGNOSIS — J45909 Unspecified asthma, uncomplicated: Secondary | ICD-10-CM

## 2021-01-21 DIAGNOSIS — G931 Anoxic brain damage, not elsewhere classified: Secondary | ICD-10-CM | POA: Diagnosis not present

## 2021-01-21 DIAGNOSIS — I10 Essential (primary) hypertension: Secondary | ICD-10-CM | POA: Diagnosis not present

## 2021-01-21 DIAGNOSIS — J9601 Acute respiratory failure with hypoxia: Secondary | ICD-10-CM | POA: Diagnosis not present

## 2021-01-21 LAB — GLUCOSE, CAPILLARY
Glucose-Capillary: 129 mg/dL — ABNORMAL HIGH (ref 70–99)
Glucose-Capillary: 135 mg/dL — ABNORMAL HIGH (ref 70–99)
Glucose-Capillary: 136 mg/dL — ABNORMAL HIGH (ref 70–99)
Glucose-Capillary: 137 mg/dL — ABNORMAL HIGH (ref 70–99)
Glucose-Capillary: 143 mg/dL — ABNORMAL HIGH (ref 70–99)
Glucose-Capillary: 90 mg/dL (ref 70–99)

## 2021-01-21 LAB — MRSA NEXT GEN BY PCR, NASAL: MRSA by PCR Next Gen: NOT DETECTED

## 2021-01-21 NOTE — Assessment & Plan Note (Signed)
Chronic respiratory failure due to severe persistent asthma and dynamic airway collapse due to tracheobronchomalacia  - Continue full mechanical ventilation 

## 2021-01-21 NOTE — Assessment & Plan Note (Signed)
Glucose controlled - Continue sliding scale corrections 

## 2021-01-21 NOTE — Assessment & Plan Note (Signed)
No facial twitching - Continue Keppra, Klonopin, Depakote

## 2021-01-21 NOTE — Assessment & Plan Note (Addendum)
BP labile.  Not tolerating metoprolol

## 2021-01-21 NOTE — Assessment & Plan Note (Signed)
Severe persistent asthma - Continue formoterol, budesonide, revefenacin, montelukast, loratadine 

## 2021-01-21 NOTE — Assessment & Plan Note (Signed)
Persistent vegetative state due to anoxic brain injury - Continue baclofen, low-dose tizanidine - Continue tube feeds 

## 2021-01-21 NOTE — Plan of Care (Signed)
  Problem: Health Behavior/Discharge Planning: Goal: Ability to manage health-related needs will improve Outcome: Progressing   Problem: Clinical Measurements: Goal: Ability to maintain clinical measurements within normal limits will improve Outcome: Progressing Goal: Will remain free from infection Outcome: Progressing Goal: Diagnostic test results will improve Outcome: Progressing Goal: Respiratory complications will improve Outcome: Progressing Goal: Cardiovascular complication will be avoided Outcome: Progressing   Problem: Activity: Goal: Risk for activity intolerance will decrease Outcome: Progressing   Problem: Elimination: Goal: Will not experience complications related to bowel motility Outcome: Progressing Goal: Will not experience complications related to urinary retention Outcome: Progressing   Problem: Pain Managment: Goal: General experience of comfort will improve Outcome: Progressing   Problem: Safety: Goal: Ability to remain free from injury will improve Outcome: Progressing   Problem: Skin Integrity: Goal: Risk for impaired skin integrity will decrease Outcome: Progressing   Problem: Activity: Goal: Ability to tolerate increased activity will improve Outcome: Progressing   Problem: Respiratory: Goal: Ability to maintain a clear airway and adequate ventilation will improve Outcome: Progressing   Problem: Activity: Goal: Ability to tolerate increased activity will improve Outcome: Progressing   Problem: Health Behavior/Discharge Planning: Goal: Ability to manage tracheostomy will improve Outcome: Progressing   Problem: Respiratory: Goal: Patent airway maintenance will improve Outcome: Progressing   

## 2021-01-21 NOTE — Progress Notes (Signed)
  Progress Note    Sonia Drake   [id number]  DOB: 1956/04/20  DOA: 2020-10-02     112 Date of Service: 01/21/2021     Subjective:  Improvement noted in mentation.  No fever.  No nursing concerns.  Hospital Problems Ventilator dependence [facility]) Chronic respiratory failure due to severe persistent asthma and dynamic airway collapse due to tracheobronchomalacia  - Continue full mechanical ventilation  Asthma Severe persistent asthma - Continue formoterol, budesonide, revefenacin, montelukast, loratadine  Anoxic brain injury (HCC) Persistent vegetative state due to anoxic brain injury - Continue baclofen, low-dose tizanidine - Continue tube feeds  Essential hypertension BP labile.  Not tolerating metoprolol   Type 2 diabetes mellitus without complication, with long-term current use of insulin (HCC) Glucose controlled - Continue sliding scale corrections  Seizures (HCC) No facial twitching - Continue Keppra, Klonopin, Depakote     Objective Vital signs were reviewed and unremarkable.  Vitals:   01/21/21 1800 01/21/21 2000 01/21/21 2009 01/21/21 2013  BP: 127/86 (!) 144/97 (!) 144/97   Pulse: (!) 115 (!) 101 (!) 109   Resp: (!) 23 15 17    Temp:      TempSrc:      SpO2: 100% 97% 98% 100%  Weight:      Height:       88.5 kg  Exam Physical Exam Constitutional:      Appearance: She is obese.     Interventions: She is intubated.  HENT:     Head: Normocephalic and atraumatic.     Nose: No nasal deformity or rhinorrhea.  Eyes:     General: Lids are normal. No scleral icterus.    Conjunctiva/sclera:     Right eye: Right conjunctiva is not injected. No chemosis.    Left eye: Left conjunctiva is not injected. No chemosis. Cardiovascular:     Rate and Rhythm: Normal rate and regular rhythm.     Heart sounds: Normal heart sounds, S1 normal and S2 normal. No murmur heard. Pulmonary:     Effort: She is intubated.     Breath sounds: Decreased breath sounds and  rhonchi present. No wheezing or rales.  Abdominal:     General: Abdomen is flat.     Palpations: Abdomen is soft.     Tenderness: There is no abdominal tenderness. There is no guarding.  Musculoskeletal:     Right lower leg: No edema.     Left lower leg: No edema.  Neurological:     Mental Status: She is unresponsive.     GCS: GCS eye subscore is 1. GCS verbal subscore is 1. GCS motor subscore is 1.      Labs / Other Information There are no new results to review at this time.  No new family contact today.  Time spent: 15 minutes [facility] 01/21/2021, 8:43 PM

## 2021-01-22 DIAGNOSIS — J9621 Acute and chronic respiratory failure with hypoxia: Secondary | ICD-10-CM | POA: Diagnosis not present

## 2021-01-22 DIAGNOSIS — I10 Essential (primary) hypertension: Secondary | ICD-10-CM | POA: Diagnosis not present

## 2021-01-22 DIAGNOSIS — J45909 Unspecified asthma, uncomplicated: Secondary | ICD-10-CM | POA: Diagnosis not present

## 2021-01-22 DIAGNOSIS — J9601 Acute respiratory failure with hypoxia: Secondary | ICD-10-CM | POA: Diagnosis not present

## 2021-01-22 DIAGNOSIS — G931 Anoxic brain damage, not elsewhere classified: Secondary | ICD-10-CM | POA: Diagnosis not present

## 2021-01-22 LAB — GLUCOSE, CAPILLARY
Glucose-Capillary: 135 mg/dL — ABNORMAL HIGH (ref 70–99)
Glucose-Capillary: 124 mg/dL — ABNORMAL HIGH (ref 70–99)
Glucose-Capillary: 116 mg/dL — ABNORMAL HIGH (ref 70–99)
Glucose-Capillary: 144 mg/dL — ABNORMAL HIGH (ref 70–99)
Glucose-Capillary: 105 mg/dL — ABNORMAL HIGH (ref 70–99)

## 2021-01-22 MED ORDER — CHLORHEXIDINE GLUCONATE 0.12 % MT SOLN
OROMUCOSAL | Status: AC
  Administered 2021-01-22: 15 mL via OROMUCOSAL
  Filled 2021-01-22: qty 15

## 2021-01-22 NOTE — Assessment & Plan Note (Signed)
Chronic respiratory failure due to severe persistent asthma and dynamic airway collapse due to tracheobronchomalacia  - Continue full mechanical ventilation

## 2021-01-22 NOTE — Assessment & Plan Note (Signed)
Severe persistent asthma - Continue formoterol, budesonide, revefenacin, montelukast, loratadine 

## 2021-01-22 NOTE — Assessment & Plan Note (Signed)
No facial twitching - Continue Keppra, Klonopin, Depakote 

## 2021-01-22 NOTE — Progress Notes (Signed)
NAME:  Sonia Drake, MRN:  [id number], DOB:  February 16, 1956, LOS: 113 ADMISSION DATE:  10/03/2020, CONSULTATION DATE:  10/04/2020 REFERRING MD:  Thedore Mins, CHIEF COMPLAINT:  Respiratory Failure   History of Present Illness:  Patient is a 64 years old female with history of asthma/COPD, congestive heart failure, cardiac arrest with PEA, chronic respiratory failure on permanent trach at 6 L/min, type 2 diabetes mellitus, GERD, seizures and severe hypoxic ischemic encephalopathy presented to the hospital with respiratory distress from skilled nursing facility.  Patient was noted to have aspiration pneumonitis and was put on mechanical ventilation.  She also underwent bronchoscopy on 6/16 which showed collapse of her trachea so tracheostomy was subsequently changed.  Currently on ventilator support through tracheostomy.  Patient has poor overall prognosis due to seizure-like activity during hospitalization.   Family wishes to give patient a full 90 days from date of admission to see if there is any improvement.  This time frame was based on patient's advanced directive.  Pertinent  Medical History   Past Medical History:  Diagnosis Date   Arthritis    "right knee; left hip" (04/07/2017)   Asthma    CHF (congestive heart failure) (HCC)    reported treatment for fluid overload in the setting of asthma exacerbation ~ 1998   COPD (chronic obstructive pulmonary disease) (HCC)    patient states she does not have COPD but was treated for it.    Depression    "from chemical imbalance" (04/07/2017)   Dyspnea    Dysrhythmia    occasional skipped beats   GERD (gastroesophageal reflux disease)    On home oxygen therapy    "I sleep with 2L" (04/07/2017)   Pneumonia    "several times" (04/07/2017)   Seizures (HCC) 2003 X 1   unknown cause     Significant Hospital Events: Including procedures, antibiotic start and stop dates in addition to other pertinent events   6/12 admitted from skilled nursing facility  after aspiration event and copious secretions per tracheostomy 6/13 appears comfortable currently on 35% ATC. No distress. WBC ct trending down. No events overnight growing GPC clusters in two of two cultures  6/16 increased work of breathing, upper airway noise, abdominal muscle use.  Bronchoscopy performed as trach exchange cuffless #8 Shiley XLT showed dynamic airway collapse, tracheal collapse. 6/16 continued respiratory distress moved to ICU, changed to cuffed #6 Shiley XLT, placed on MV with some improved comfort 6/12 cefepime > 6/14, 6/18 6/12 vanc > 6/14, 6/18 6/23 mucus plugging, severe vent dyssynchrony 6/24-25 intermittent jerking, seizures on EEG 6/27 weaned off propofol gtt 6/28 briefly tolerated trach collar in AM 6/29 tolerated trach collar in AM; no further seizure like activity; working with CM/SW for vent-SNF 7/1 Weaning on CPAP/ PS 10/5, CM SW working on [employer], NO seizure activity, secretions are not an issue per nursing 7/11 trach changed to #8 Bivona with air cuff, much improved. 7/12 TCT 12 hours; rested on Vent overnight 7/13 on TCT 7/23 Trach collar tolerated 7/24 Full vent support 7/26>> Remains on full vent support this am, no issues with secretions per nursing 8/5 occluding trach with dynamic collapse: seen on bronchoscopy 8/6 called back emergently to bedside for tracheostomy occlusion: emergent bronch, bivona trach migrated back 2cm, improved with advancing to 2cm above carina 8/8 collapse caused hypoxia. Improved with repositioning  8/9 afternoon had another episode of tracheal collapse and occlusion of trach requiring BVM, sedation. Family produced paperwork which seems to have conflicting information about pts  wishes  8/11: pt continues to have multiple daily episodes of tracheal collapse/mucous plugging req lavage, BVM. 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  Neuro consulted 8/30 overnight , BP drop which responded to fluid bolus 9/2 Continues to have labile BP, now midodrine prn 9/14 Appreciate ethics review of advance directives. Support ethics recommendation regarding patient autonomy  by following her wishes as she outlined.   Interim History / Subjective:  No improvement in exam. Remains on vent with intermittent severe airway compromise that requires trach manipulation and suctioning.  Objective   Blood pressure (!) 148/99, pulse 95, temperature 98.4 F (36.9 C), temperature source Axillary, resp. rate (!) 23, height 5\' 2"  (1.575 m), weight 88.6 kg, SpO2 99 %.    Vent Mode: PRVC FiO2 (%):  [30 %] 30 % Set Rate:  [16 bmp] 16 bmp Vt Set:  [400 mL] 400 mL PEEP:  [5 cmH20] 5 cmH20 Plateau Pressure:  [13 cmH20-19 cmH20] 19 cmH20   Intake/Output Summary (Last 24 hours) at 01/22/2021 1025 Last data filed at 01/22/2021 0600 Gross per 24 hour  Intake 450 ml  Output 1250 ml  Net -800 ml    Filed Weights   01/19/21 0500 01/20/21 0500 01/22/21 0432  Weight: 90.2 kg 88.5 kg 88.6 kg    Examination: Elderly woman on vent eyes open not tracking Minimal cough Thick secretions blood-tinged Decorticate posturing to pain All of this is unchanged from my exams for the past 3 months she has been here   Assessment & Plan:  Anoxic encephalopathy / anoxic brain injury w/ PVS Acute on chronic respiratory failure w hypercarbia and hypoxia  Ventilator dependence  Severe tracheobronchomalacia Tracheostomy dependent Hx COPD  Dynamic airway collapse    Plan --Continue full vent support, not a candidate for weaning due to severe tracheobronchomalacia and anoxic injury -- As documented in previous notes I believe continued mechanical ventilation is not beneficial and contradicts patient's advance directives -- Will continue to see weekly, available to help with futility policy PRN  03/24/21 MD PCCM

## 2021-01-22 NOTE — Assessment & Plan Note (Signed)
Persistent vegetative state due to anoxic brain injury - Continue baclofen, low-dose tizanidine - Continue tube feeds 

## 2021-01-22 NOTE — Assessment & Plan Note (Signed)
BP labile.  Not tolerating metoprolol  

## 2021-01-22 NOTE — Assessment & Plan Note (Signed)
Glucose controlled - Continue sliding scale corrections 

## 2021-01-22 NOTE — Progress Notes (Signed)
  Progress Note    Sonia Drake   PPI:[id number]  DOB: 01/11/1956  DOA: 10-21-2020     113 Date of Service: 01/22/2021     Subjective:  Improvement noted in mentation.  No fever.  No improvement in ventilation or breathing.  No major events overnight, no nursing concerns.  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  Not tolerating metoprolol   Type 2 diabetes mellitus without complication, with long-term current use of insulin (HCC) Glucose controlled - Continue sliding scale corrections  Seizures (HCC) No facial twitching - Continue Keppra, Klonopin, Depakote     Objective Vital signs were reviewed and unremarkable.  Vitals:   01/22/21 0432 01/22/21 0500 01/22/21 0600 01/22/21 0722  BP:  126/80 (!) 86/56   Pulse:  79 76 78  Resp:  16 16 16   Temp:      TempSrc:      SpO2:  100% 98% 100%  Weight: 88.6 kg     Height:       88.6 kg  Exam Physical Exam Constitutional:      Appearance: She is obese.     Interventions: She is intubated.  HENT:     Head: Normocephalic and atraumatic.     Nose: No nasal deformity or rhinorrhea.  Eyes:     General: Lids are normal. No scleral icterus.    Conjunctiva/sclera:     Right eye: Right conjunctiva is not injected. No chemosis.    Left eye: Left conjunctiva is not injected. No chemosis. Cardiovascular:     Rate and Rhythm: Normal rate and regular rhythm.     Heart sounds: Normal heart sounds, S1 normal and S2 normal. No murmur heard. Pulmonary:     Effort: She is intubated.      Breath sounds: Decreased breath sounds and wheezing present. No rales.  Abdominal:     General: Abdomen is flat.     Palpations: Abdomen is soft.     Tenderness: There is no abdominal tenderness. There is no guarding.  Musculoskeletal:     Right lower leg: No edema.     Left lower leg: No edema.  Neurological:     Mental Status: She is unresponsive.     GCS: GCS eye subscore is 1. GCS verbal subscore is 1. GCS motor subscore is 1.       Labs / Other Information There are no new results to review at this time.    Time spent: 15 minutes [facility] 01/22/2021, 7:29 AM

## 2021-01-23 DIAGNOSIS — I10 Essential (primary) hypertension: Secondary | ICD-10-CM | POA: Diagnosis not present

## 2021-01-23 DIAGNOSIS — R569 Unspecified convulsions: Secondary | ICD-10-CM | POA: Diagnosis not present

## 2021-01-23 DIAGNOSIS — G931 Anoxic brain damage, not elsewhere classified: Secondary | ICD-10-CM | POA: Diagnosis not present

## 2021-01-23 DIAGNOSIS — J45909 Unspecified asthma, uncomplicated: Secondary | ICD-10-CM | POA: Diagnosis not present

## 2021-01-23 LAB — GLUCOSE, CAPILLARY
Glucose-Capillary: 120 mg/dL — ABNORMAL HIGH (ref 70–99)
Glucose-Capillary: 124 mg/dL — ABNORMAL HIGH (ref 70–99)
Glucose-Capillary: 126 mg/dL — ABNORMAL HIGH (ref 70–99)
Glucose-Capillary: 134 mg/dL — ABNORMAL HIGH (ref 70–99)
Glucose-Capillary: 140 mg/dL — ABNORMAL HIGH (ref 70–99)
Glucose-Capillary: 141 mg/dL — ABNORMAL HIGH (ref 70–99)
Glucose-Capillary: 86 mg/dL (ref 70–99)

## 2021-01-23 MED ORDER — CHLORHEXIDINE GLUCONATE 0.12 % MT SOLN
OROMUCOSAL | Status: AC
  Filled 2021-01-23: qty 15

## 2021-01-23 NOTE — Care Plan (Signed)
Family meeting this evening.  Dr. Lindie Spruce, CMO in attendance, as well as 55M director, Sonia Drake, 55M asst. Director, SW, and myself. Sonia Drake, Sonia Drake, Sonia Kent Sr. and a friend of the family.  Clinical course to date summarized.  Family shared grievances related to lacking communication and manner of communication.  Futility policy outlined.    Final plan: -Family will use next 72 hours to explore transfer to another facility -Dr. Maryfrances Bunnell will be removed from the case -If an accepting physician/facility is found, [place] will coordinate with accepting facility for transfer -If no accepting physician/facility is found, Sonia Drake will be compassionately weaned off the ventilator, all tube feeds and medications will be stopped on Saturday, Oct. 8 at [facility] and Delaney Meigs confirmed agreement.

## 2021-01-23 NOTE — Progress Notes (Signed)
Nutrition Follow-up  DOCUMENTATION CODES:   Not applicable  INTERVENTION:   Tube feeds via PEG: - Jevity 1.5 @ 45 ml/hr (1080 ml/day) - ProSource TF 45 ml BID - Free water flushes of 200 ml q 4 hours   Tube feeding regimen provides 1700 kcal, 91 grams of protein, and 821 ml of H2O.   Total free water with flushes: 2280 ml  NUTRITION DIAGNOSIS:   Increased nutrient needs related to wound healing as evidenced by estimated needs.  Ongoing, being addressed via TF  GOAL:   Patient will meet greater than or equal to 90% of their needs  Met via TF  MONITOR:   Vent status, Labs, Weight trends, TF tolerance, Skin, I & O's  REASON FOR ASSESSMENT:   Consult Enteral/tube feeding initiation and management  ASSESSMENT:   Sonia Drake is a 65 y.o. female with medical history significant of asthma/COPD, CHF, cardiac arrest with PEA/CPR with chronic respiratory failure on permanent trach with supplemental oxygen at 6L, type 2 DM, GERD, seizures and severe hypoxic-ischemic encephalopathy who presented to ER via EMS for respiratory distress from SNF.  Reviewed chart. Plan is for compassionate withdrawal of care tomorrow, 01/24/21, at 4:00 pm. Tube feeds to be discontinued at that time.  Current TF: Jevity 1.5 @ 45 ml/hr, ProSource TF 45 ml BID, free water flushes 200 ml q 4 hours  Admit weight: 88.4 kg Current weight: 88.6 kg  Patient remains on ventilator support via trach MV: 7.3 L/min Temp (24hrs), Avg:97.9 F (36.6 C), Min:97.4 F (36.3 C), Max:98.7 F (37.1 C)  Medications reviewed and include: pepcid, nutrisource fiber BID, SSI q 4 hours, semglee 10 units daily  Labs reviewed. CBG's: 86-144 x 24 hours  UOP: 1350 ml x 24 hours  Diet Order:   Diet Order             Diet NPO time specified  Diet effective now                   EDUCATION NEEDS:   No education needs have been identified at this time  Skin:  Skin Assessment: Skin Integrity Issues: Other:  MASD to buttocks  Last BM:  01/22/21 medium type 4  Height:   Ht Readings from Last 1 Encounters:  01/18/21 5' 2"  (1.575 m)    Weight:   Wt Readings from Last 1 Encounters:  01/22/21 88.6 kg    Ideal Body Weight:  45.5 kg  BMI:  Body mass index is 35.73 kg/m.  Estimated Nutritional Needs:   Kcal:  1600-1800  Protein:  90-105 grams  Fluid:  > 1.6 L    Gustavus Bryant, MS, RD, LDN Inpatient Clinical Dietitian Please see AMi[facility] for contact information.

## 2021-01-23 NOTE — Progress Notes (Signed)
[facility] PROGRESS NOTE    Sonia Drake  FEO:[id number] DOB: [birth year] DOA: 10/12/2020 PCP: Garwin Brothers, MD      Brief Narrative:  [name] is a 65 y.o. F with hx anoxic brain injury after OOH PEA arrest in Aug 2021 at [facility], now in persistent vegetative state, tracheostomy and PEG dependent, complicated by seizures, prior history of DM, severe asthma FEV1 38% who was sent from SNF for respiratory distress.  Subsequent hospital stay has been fairly notable for failure to thrive.  She has remained fully ventilator dependent, persistently unable to wean from ventilator due to dynamic airway collapse leading to occasional unpredictable episodes of increased respiratory effort and ventilator-respiration dyssynchrony, has occasional myoclonic seizures, and has had no appreciable neurological improvement.           Assessment & Plan:  Chronic respiratory failure  Dynamic airway collapse due to tracheobronchomalacia Severe persistent asthma Tracheostomy dependence - Continue full mechanical ventilation - Continue aformoterol, budesonide, famotidine, montelukast, revefenacin    Futility policy shared with family, see Care Plan from today.   - Plan for 72 hour period for family to search for alternative facility - If no facility found, tube feeds, all medication, and ventilator will be stopped at 3pm on Sat. 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  I do not even appreciate decorticate posturing to pain. - Continue baclofen, stress imaging - Avoid sedating medicines per patient's wishes on last wished at the end of life - Continue tube feeds   Seizure disorder She has had no tonic-clonic seizures, continues to have intermittent myoclonic seizures.  Epileptologist were consulted, recommend  minimizing AEDs, in order to avoid sedation per patients wishes but also because it will lead to no improvement in her seizure frequency or functional status - Continue Keppra, Klonopin, Depakote  Type 2 diabetes Glucose controlled - Continue Lantus, sliding scale corrections   Hypertension Intermittent hypotension due to dysautonomia  from anoxic brain injury Sinus tachycardia -As needed metoprolol or midodrine   Cerebrovascular disease, secondary prevention   Obesity BMI 36   Anemia of chronic disease Hgb stable, no clinical bleeding         Resolved issues: MDR bacteremia with staph epidermis/capitis Cultures obtained 8/25.  Completed 7 days of IV vancomycin as recommended by ID.   No further fever.  History of VRE/Klebsielle colonization of urine Noted on in urine 8/24, no treatment recommended by ID.  No further fever.  Hyponatremia Resolved  Malnutrition ruled out  Stage II sacral decubitus ulcer, ruled out      Disposition: Status is: Inpatient  Remains inpatient appropriate because:Unsafe d/c plan         Level of care: ICU       MDM: The below labs and imaging reports were reviewed and summarized above.  Medication management as above.    DVT prophylaxis:  Lovenox  Code Status: FULL Family Communication: Unable to safely wean from vent.  Palliative care were consulted.  Continuing current treatment provides no perceivable benefit, appears reasonably likely to cause harm and discomfort, and is arguably against the patient's wishes as expressed in her Advanced Directive.  The Ethics committee was consulted, who feel that based on their independent review of her advanced directive, continued care is against her wishes. Family dispute this  interpretation.  Katrina Stack attempts were made by the medical team and Palliative Care to reach a common understanding of her condition, her progress and the ethical hazard of continued treatment, but there  are irreconcilable differences.  See Care Plan note from today.             Subjective: No fever.  No new nursing complaints.  No change in mentation.  Objective: Vitals:   01/23/21 1600 01/23/21 1700 01/23/21 1800 01/23/21 1928  BP: 110/85 117/90 (!) 139/96   Pulse: 79     Resp: 16 17 (!) 21   Temp:      TempSrc:      SpO2:    100%  Weight:      Height:        Intake/Output Summary (Last 24 hours) at 01/23/2021 1942 Last data filed at 01/23/2021 1940 Gross per 24 hour  Intake 2170 ml  Output 1950 ml  Net 220 ml    Filed Weights   01/19/21 0500 01/20/21 0500 01/22/21 0432  Weight: 90.2 kg 88.5 kg 88.6 kg    Examination: General appearance:   Adult female, intubated via tracheostomy, unresponsive, eyes open HEENT:   Anicteric, conjunctiva normal, pupils respond but no spontaneous eye movement, does not track,   no nasal discharge, tracheostomy is clean, no secretions or blood Skin:   Cardiac:  Tachycardic, regular, no murmurs, no LE edema actually Respiratory:  Ventilated, breathing in synchrony, no particular rales or wheezing Abdomen:      Abdomen soft, without distension, no apparent tenderness, no rigidity, no masses MSK:   Neuro:   Unresponsive Psych:  Unresponsive     CBG: Recent Labs  Lab 01/23/21 0048 01/23/21 0351 01/23/21 0726 01/23/21 1139 01/23/21 1511  GLUCAP 141* 86 126* 120* 134*          Scheduled Meds:  arformoterol  15 mcg Nebulization BID   baclofen  5 mg Per Tube TID   budesonide  0.5 mg Nebulization BID   chlorhexidine gluconate (MEDLINE KIT)  15 mL Mouth Rinse BID   Chlorhexidine Gluconate Cloth  6 each Topical Q2000   clonazePAM  1 mg Per Tube BID   enoxaparin (LOVENOX) injection  0.5 mg/kg Subcutaneous Q24H   famotidine  20 mg Per Tube BID   feeding supplement (PROSource TF)  45 mL Per Tube BID   fiber  1 packet Per Tube BID   free water  200 mL Per Tube Q4H   Gerhardt's butt cream   Topical BID   guaiFENesin  5 mL  Per Tube Q6H   hydrocortisone cream   Topical TID   insulin aspart  0-15 Units Subcutaneous Q4H   insulin glargine-yfgn  10 Units Subcutaneous QHS   levETIRAcetam  2,500 mg Per Tube BID   loratadine  10 mg Per Tube Daily   mouth rinse  15 mL Mouth Rinse QID   montelukast  10 mg Per Tube Daily   revefenacin  175 mcg Nebulization Daily   tiZANidine  2 mg Per Tube QHS   valproic acid  1,000 mg Per Tube Q12H   Continuous Infusions:  sodium chloride Stopped (01/01/21 2232)   feeding supplement (JEVITY 1.5 CAL/FIBER) 1,000 mL (01/23/21 1934)     LOS: 114 days    Time spent: 140 minutes    [name], MD [facility] 01/23/2021, 7:42 PM     Please page though [place] or Epic secure chat:  For [employer], Adult nurse

## 2021-01-23 NOTE — Progress Notes (Signed)
CSW spoke with patient's niece and daughter who requested CSW contact [facility] and [facility] again to determine if they can offer the patient a bed.  CSW spoke with Clydie Braun at [facility] who states she will review the referral again but states the facility will have to make arrangements to accommodate the patient's latex allergy if a bed is offered. CSW sent referral via secure e-mail to Clydie Braun.  CSW spoke with Alcario Drought at [facility] who states she will review patient's referral again. CSW sent referral via secure e-mail to [place].  [name], MSW, LCSW Transitions of Care  Clinical Social Worker II [phone]

## 2021-01-24 DIAGNOSIS — G931 Anoxic brain damage, not elsewhere classified: Secondary | ICD-10-CM | POA: Diagnosis not present

## 2021-01-24 LAB — GLUCOSE, CAPILLARY
Glucose-Capillary: 144 mg/dL — ABNORMAL HIGH (ref 70–99)
Glucose-Capillary: 148 mg/dL — ABNORMAL HIGH (ref 70–99)
Glucose-Capillary: 146 mg/dL — ABNORMAL HIGH (ref 70–99)
Glucose-Capillary: 128 mg/dL — ABNORMAL HIGH (ref 70–99)
Glucose-Capillary: 142 mg/dL — ABNORMAL HIGH (ref 70–99)
Glucose-Capillary: 126 mg/dL — ABNORMAL HIGH (ref 70–99)

## 2021-01-24 NOTE — Progress Notes (Signed)
CSW attended family conference with patient's daughter Dorraine Ellender, niece Sidney Ace, Dr. Micheal Likens, RN, Dollene Primrose, RN, and Dr. Maryfrances Bunnell.  [name], MSW, LCSW Transitions of Care  Clinical Social Worker II [phone]

## 2021-01-24 NOTE — Progress Notes (Signed)
CSW has been in communication with Alcario Drought at [facility] - CSW has provided ventilator settings, skin / wound assessment, and most recent RT notes to facility for review.  At this time, the facility has not offered a bed.  [name], MSW, LCSW Transitions of Care  Clinical Social Worker II [phone]

## 2021-01-24 NOTE — Progress Notes (Signed)
[facility] PROGRESS NOTE    Sonia Drake  OAC:[id number] DOB: 01-22-1956 DOA: 10/11/2020 PCP: Garwin Brothers, MD      Brief Narrative:  Sonia Drake is a 65 y.o. F with hx anoxic brain injury after OOH PEA arrest in Aug 2021 at 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  Subsequent hospital stay has been fairly notable for failure to thrive.  She has remained fully ventilator dependent, persistently unable to wean from ventilator due to dynamic airway collapse leading to occasional unpredictable episodes of increased respiratory effort and ventilator-respiration dyssynchrony, has occasional myoclonic seizures, and has had no appreciable neurological improvement.  Assessment & Plan:  Chronic respiratory failure  Dynamic airway collapse due to tracheobronchomalacia Severe persistent asthma Tracheostomy dependence - Continue full mechanical ventilation - Continue aformoterol, budesonide, famotidine, montelukast, revefenacin   Futility policy shared with family by Dr. Boston Service, see his care plan note from 01/23/2021. - Plan for 72 hour period for family to search for alternative facility - If no facility found, tube feeds, all medication, and ventilator will be stopped at 3pm on Sat. 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  I do not even appreciate decorticate posturing to pain. - Continue baclofen, stress imaging - Avoid sedating medicines per patient's wishes on last wished at the end of life - Continue tube feeds   Seizure disorder She has had no tonic-clonic seizures, continues to have intermittent myoclonic seizures.  Epileptologist were consulted, recommend  minimizing AEDs, in order to avoid sedation per patients wishes but also because it will lead to no improvement in her seizure frequency or functional status - Continue Keppra, Klonopin, Depakote  Type 2 diabetes Glucose controlled - Continue Lantus, sliding scale corrections   Hypertension Intermittent hypotension due to dysautonomia  from anoxic brain injury Sinus tachycardia -As needed metoprolol or midodrine   Cerebrovascular disease, secondary prevention   Obesity BMI 36   Anemia of chronic disease Hgb stable, no clinical bleeding   Resolved issues: MDR bacteremia with staph epidermis/capitis Cultures obtained 8/25.  Completed 7 days of IV vancomycin as recommended by ID.   No further fever.  History of VRE/Klebsielle colonization of urine Noted on in urine 8/24, no treatment recommended by ID.  No further fever.  Hyponatremia Resolved  Malnutrition ruled out  Stage II sacral decubitus ulcer, ruled out  Disposition: Status is: Inpatient  Remains inpatient appropriate because:Unsafe d/c plan  Level of care: ICU  MDM: The below labs and imaging reports were reviewed and summarized above.  Medication management as above.    DVT prophylaxis:  Lovenox  Code Status: FULL Family Communication: None today  Subjective: Patient seen and examined.  On vent.  Unresponsive.  Objective: Vitals:   01/24/21 0900 01/24/21 1000 01/24/21 1024 01/24/21 1147  BP: 91/74     Pulse: 71     Resp: 16 16 16    Temp:    98.5 F (36.9 C)  TempSrc:    Axillary  SpO2: 100%  100%   Weight:      Height:        Intake/Output Summary (Last 24 hours) at 01/24/2021 1214 Last data filed at 01/24/2021 0700 Gross per 24 hour  Intake 2205 ml  Output 1330 ml  Net 875 ml    Filed Weights   01/20/21 0500 01/22/21 0432 01/24/21 0353  Weight: 88.5 kg 88.6 kg 89.1 kg    Examination: General exam:  intubated with tracheostomy. Respiratory system: Clear to  auscultation. Cardiovascular system: S1 & S2 heard, RRR. No JVD, murmurs, rubs, gallops or clicks. No pedal edema. Gastrointestinal system: Abdomen is nondistended, soft and nontender. No organomegaly or masses felt. Normal bowel sounds heard. Central nervous system: Intubated/unresponsive.      CBG: Recent Labs  Lab 01/23/21 2013 01/23/21 2352 01/24/21 0343 01/24/21 0732 01/24/21 1145  GLUCAP 124* 140* 144* 148* 146*          Scheduled Meds:  arformoterol  15 mcg Nebulization BID   baclofen  5 mg Per Tube TID   budesonide  0.5 mg Nebulization BID   chlorhexidine gluconate (MEDLINE KIT)  15 mL Mouth Rinse BID   Chlorhexidine Gluconate Cloth  6 each Topical Q2000   clonazePAM  1 mg Per Tube BID   enoxaparin (LOVENOX) injection  0.5 mg/kg Subcutaneous Q24H   famotidine  20 mg Per Tube BID   feeding supplement (PROSource TF)  45 mL Per Tube BID   fiber  1 packet Per Tube BID   free water  200 mL Per Tube Q4H   Gerhardt's butt cream   Topical BID   guaiFENesin  5 mL Per Tube Q6H   hydrocortisone cream   Topical TID   insulin aspart  0-15 Units Subcutaneous Q4H   insulin glargine-yfgn  10 Units Subcutaneous QHS   levETIRAcetam  2,500 mg Per Tube BID   loratadine  10 mg Per Tube Daily   mouth rinse  15 mL Mouth Rinse QID   montelukast  10 mg Per Tube Daily   revefenacin  175 mcg Nebulization Daily   tiZANidine  2 mg Per Tube QHS   valproic acid  1,000 mg Per Tube Q12H   Continuous Infusions:  sodium chloride Stopped (01/01/21 2232)   feeding supplement (JEVITY 1.5 CAL/FIBER) 1,000 mL (01/23/21 1934)     LOS: 115 days    Time spent: 29 minutes  Darliss Cheney, MD [facility] 01/24/2021, 12:14 PM     Please page though AMION or Epic secure chat:  For [employer], Adult nurse

## 2021-01-25 DIAGNOSIS — J9601 Acute respiratory failure with hypoxia: Secondary | ICD-10-CM | POA: Diagnosis not present

## 2021-01-25 LAB — GLUCOSE, CAPILLARY
Glucose-Capillary: 135 mg/dL — ABNORMAL HIGH (ref 70–99)
Glucose-Capillary: 128 mg/dL — ABNORMAL HIGH (ref 70–99)
Glucose-Capillary: 115 mg/dL — ABNORMAL HIGH (ref 70–99)
Glucose-Capillary: 118 mg/dL — ABNORMAL HIGH (ref 70–99)
Glucose-Capillary: 168 mg/dL — ABNORMAL HIGH (ref 70–99)
Glucose-Capillary: 149 mg/dL — ABNORMAL HIGH (ref 70–99)

## 2021-01-25 NOTE — Progress Notes (Signed)
CSW spoke with patient's niece Jodene Nam to provide her with an update - CSW informed Jodene Nam that no bed offers have been obtained at this time.  CSW attempted to reach Clydie Braun at [facility] - no answer, a voicemail was left requesting a return call.  CSW spoke with Alcario Drought at [facility] who will have DON and Respiratory Director review updated notes ASAP for a decision.  [name], MSW, LCSW Transitions of Care  Clinical Social Worker II [phone]

## 2021-01-25 NOTE — Progress Notes (Signed)
[facility] PROGRESS NOTE    Sonia Drake  QIW:[id number] DOB: [birth year] DOA: 10/19/2020 PCP: Garwin Brothers, MD      Brief Narrative:  Sonia Drake is a 65 y.o. 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  Subsequent hospital stay has been fairly notable for failure to thrive.  She has remained fully ventilator dependent, persistently unable to wean from ventilator due to dynamic airway collapse leading to occasional unpredictable episodes of increased respiratory effort and ventilator-respiration dyssynchrony, has occasional myoclonic seizures, and has had no appreciable neurological improvement.  Assessment & Plan:  Chronic respiratory failure  Dynamic airway collapse due to tracheobronchomalacia Severe persistent asthma Tracheostomy dependence - Continue full mechanical ventilation - Continue aformoterol, budesonide, famotidine, montelukast, revefenacin   Futility policy shared with family by Dr. Boston Service, see his care plan note from 01/23/2021. - Plan for 72 hour period for family to search for alternative facility - If no facility found, tube feeds, all medication, and ventilator will be stopped at 3pm on Sat. 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  I do not even appreciate decorticate posturing to pain. - Continue baclofen, stress imaging - Avoid sedating medicines per patient's wishes on last wished at the end of life - Continue tube feeds   Seizure disorder She has had no tonic-clonic seizures, continues to have intermittent myoclonic seizures.  Epileptologist were consulted, recommend  minimizing AEDs, in order to avoid sedation per patients wishes but also because it will lead to no improvement in her seizure frequency or functional status - Continue Keppra, Klonopin, Depakote  Type 2 diabetes Glucose controlled - Continue Lantus, sliding scale corrections   Hypertension Intermittent hypotension due to dysautonomia  from anoxic brain injury Sinus tachycardia -As needed metoprolol or midodrine   Cerebrovascular disease, secondary prevention   Obesity BMI 36   Anemia of chronic disease Hgb stable, no clinical bleeding   Resolved issues: MDR bacteremia with staph epidermis/capitis Cultures obtained 8/25.  Completed 7 days of IV vancomycin as recommended by ID.   No further fever.  History of VRE/Klebsielle colonization of urine Noted on in urine 8/24, no treatment recommended by ID.  No further fever.  Hyponatremia Resolved  Malnutrition ruled out  Stage II sacral decubitus ulcer, ruled out  Disposition: Status is: Inpatient  Remains inpatient appropriate because:Unsafe d/c plan  Level of care: ICU  MDM: The below labs and imaging reports were reviewed and summarized above.  Medication management as above.    DVT prophylaxis:  Lovenox  Code Status: FULL Family Communication: None today  Subjective: Seen and examined.  Patient on vent and unresponsive.  Objective: Vitals:   01/25/21 0423 01/25/21 0759 01/25/21 0816 01/25/21 0826  BP:      Pulse:   77   Resp:   17   Temp:  98.3 F (36.8 C)    TempSrc:  Oral    SpO2:   100% 100%  Weight: 90.8 kg     Height:        Intake/Output Summary (Last 24 hours) at 01/25/2021 1111 Last data filed at 01/25/2021 0545 Gross per 24 hour  Intake 938  ml  Output 1200 ml  Net -262 ml    Filed Weights   01/22/21 0432 01/24/21 0353 01/25/21 0423  Weight: 88.6 kg 89.1 kg 90.8 kg    Examination: General exam: Patient intubated, on vent and unresponsive. Respiratory system: Clear to auscultation.   Cardiovascular system: S1 & S2 heard, RRR. No JVD, murmurs, rubs, gallops or clicks. No pedal edema. Gastrointestinal system: Abdomen is nondistended, soft and nontender. No organomegaly or masses felt. Normal bowel sounds heard. Central nervous system: Unresponsive on vent.  CBG: Recent Labs  Lab 01/24/21 1517 01/24/21 2001 01/24/21 2327 01/25/21 0346 01/25/21 0753  GLUCAP 128* 142* 126* 135* 128*          Scheduled Meds:  arformoterol  15 mcg Nebulization BID   baclofen  5 mg Per Tube TID   budesonide  0.5 mg Nebulization BID   chlorhexidine gluconate (MEDLINE KIT)  15 mL Mouth Rinse BID   Chlorhexidine Gluconate Cloth  6 each Topical Q2000   clonazePAM  1 mg Per Tube BID   enoxaparin (LOVENOX) injection  0.5 mg/kg Subcutaneous Q24H   famotidine  20 mg Per Tube BID   feeding supplement (PROSource TF)  45 mL Per Tube BID   fiber  1 packet Per Tube BID   free water  200 mL Per Tube Q4H   Gerhardt's butt cream   Topical BID   guaiFENesin  5 mL Per Tube Q6H   hydrocortisone cream   Topical TID   insulin aspart  0-15 Units Subcutaneous Q4H   insulin glargine-yfgn  10 Units Subcutaneous QHS   levETIRAcetam  2,500 mg Per Tube BID   loratadine  10 mg Per Tube Daily   mouth rinse  15 mL Mouth Rinse QID   montelukast  10 mg Per Tube Daily   revefenacin  175 mcg Nebulization Daily   tiZANidine  2 mg Per Tube QHS   valproic acid  1,000 mg Per Tube Q12H   Continuous Infusions:  sodium chloride Stopped (01/01/21 2232)   feeding supplement (JEVITY 1.5 CAL/FIBER) 1,000 mL (01/23/21 1934)     LOS: 116 days    Time spent: 25 minutes  Darliss Cheney, MD [facility] 01/25/2021, 11:11 AM     Please page though AMION or Epic secure chat:  For [employer], Adult nurse

## 2021-01-26 LAB — GLUCOSE, CAPILLARY
Glucose-Capillary: 141 mg/dL — ABNORMAL HIGH (ref 70–99)
Glucose-Capillary: 165 mg/dL — ABNORMAL HIGH (ref 70–99)
Glucose-Capillary: 139 mg/dL — ABNORMAL HIGH (ref 70–99)
Glucose-Capillary: 120 mg/dL — ABNORMAL HIGH (ref 70–99)
Glucose-Capillary: 152 mg/dL — ABNORMAL HIGH (ref 70–99)

## 2021-01-26 MED ORDER — CHLORHEXIDINE GLUCONATE CLOTH 2 % EX PADS
6.0000 | MEDICATED_PAD | Freq: Every day | CUTANEOUS | Status: DC
  Administered 2021-01-26 – 2021-01-27 (×2): 6 via TOPICAL

## 2021-01-26 NOTE — Progress Notes (Addendum)
2:30pm: CSW spoke with patient's niece Jodene Nam to inform her a decision has not yet been made by [facility].  12pm: CSW spoke with Alcario Drought at [facility] who states the Respiratory Director needed to know if the patient has had any recent airway collapse events.  CSW spoke with RN on unit to obtain clarity regarding request - CSW relayed information to [place] at [facility] and she states she will return call to CSW with a bed offer determination.  11am: CSW spoke with MD regarding plan for patient - MD states Palliative MD will see patient and discuss potential hospice options with family.  [name], MSW, LCSW Transitions of Care  Clinical Social Worker II [phone]

## 2021-01-26 NOTE — Progress Notes (Signed)
[facility] PROGRESS NOTE    Sonia Drake  NTZ:[id number] DOB: 09/28/1955 DOA: 10/09/2020 PCP: Garwin Brothers, MD      Brief Narrative:   [name] is a 65 y.o. 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  Subsequent hospital stay has been fairly notable for failure to thrive.  She has remained fully ventilator dependent, persistently unable to wean from ventilator due to dynamic airway collapse leading to occasional unpredictable episodes of increased respiratory effort and ventilator-respiration dyssynchrony, has occasional myoclonic seizures, and has had no appreciable neurological improvement.  Assessment & Plan:  Chronic respiratory failure  Dynamic airway collapse due to tracheobronchomalacia Severe persistent asthma Tracheostomy dependence Vent dependent respiratory failure -Continue with full mechanical ventilation. - Continue aformoterol, budesonide, famotidine, montelukast, revefenacin  - Futility policy shared with family during a meeting on 01/23/2021, Plan for 72 hour period for family to search for alternative facility, If no facility found, tube feeds, all medication, and ventilator will be stopped at 3pm on Sat. Oct 8.  I have discussed with PCCM attending, critical care service very familiar with the patient, given her prolonged ICU stay, and vent dependent respiratory failure, so PCCM will assume care as of today.   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  I do not even appreciate decorticate posturing to pain. - Continue baclofen, stress imaging - Avoid sedating medicines per patient's wishes on  last wished at the end of life - Continue tube feeds   Seizure disorder She has had no tonic-clonic seizures, continues to have intermittent myoclonic seizures.  Epileptologist were consulted, recommend minimizing AEDs, in order to avoid sedation per patients wishes but also because it will lead to no improvement in her seizure frequency or functional status - Continue Keppra, Klonopin, Depakote  Type 2 diabetes Glucose controlled - Continue Lantus, sliding scale corrections   Hypertension Intermittent hypotension due to dysautonomia  from anoxic brain injury Sinus tachycardia -As needed metoprolol or midodrine   Cerebrovascular disease, secondary prevention   Obesity BMI 36   Anemia of chronic disease Hgb stable, no clinical bleeding   Resolved issues: MDR bacteremia with staph epidermis/capitis Cultures obtained 8/25.  Completed 7 days of IV vancomycin as recommended by ID.   No further fever.  History of VRE/Klebsielle colonization of urine Noted on in urine 8/24, no treatment recommended by ID.  No further fever.  Hyponatremia Resolved  Malnutrition ruled out  Stage II sacral decubitus ulcer, ruled out  Disposition: Status is: Inpatient  Remains inpatient appropriate because:Unsafe d/c plan  Level of care: ICU      DVT prophylaxis:  Lovenox  Code Status: FULL Family Communication: None at bedside today  Subjective: Seen and examined.  Patient on vent and unresponsive.  Significant events as discussed with staff  Objective: Vitals:   01/26/21 1400 01/26/21 1500 01/26/21 1514 01/26/21 1600  BP: 137/86 (!) 158/88  (!) 164/93  Pulse: 89 (!) 102  (!) 108  Resp: 14 (!) 21  18  Temp:    98.2 F (36.8 C)  TempSrc:    Axillary  SpO2: 100% 100% 100% 99%  Weight:      Height:  Intake/Output Summary (Last 24 hours) at 01/26/2021 1625 Last data filed at 01/26/2021 0800 Gross per 24 hour  Intake 915 ml  Output 825 ml  Net 90 ml   Filed Weights    01/24/21 0353 01/25/21 0423 01/26/21 0500  Weight: 89.1 kg 90.8 kg 90.7 kg    Examination:  In bed, no apparent distress, eyes open intermittently, does not respond to voice, does not follow any commands.  Communicative.   Vented  respiratory sounds bilaterally  Rate and rhythm, no rubs or gallops Abdomen soft, nontender, nondistended Extremities with no edema, clubbing or cyanosis.   CBG: Recent Labs  Lab 01/25/21 2316 01/26/21 0358 01/26/21 0757 01/26/21 1140 01/26/21 1612  GLUCAP 149* 141* 165* 139* 120*         Scheduled Meds:  arformoterol  15 mcg Nebulization BID   baclofen  5 mg Per Tube TID   budesonide  0.5 mg Nebulization BID   chlorhexidine gluconate (MEDLINE KIT)  15 mL Mouth Rinse BID   Chlorhexidine Gluconate Cloth  6 each Topical Q0600   clonazePAM  1 mg Per Tube BID   enoxaparin (LOVENOX) injection  0.5 mg/kg Subcutaneous Q24H   famotidine  20 mg Per Tube BID   feeding supplement (PROSource TF)  45 mL Per Tube BID   fiber  1 packet Per Tube BID   free water  200 mL Per Tube Q4H   Gerhardt's butt cream   Topical BID   guaiFENesin  5 mL Per Tube Q6H   hydrocortisone cream   Topical TID   insulin aspart  0-15 Units Subcutaneous Q4H   insulin glargine-yfgn  10 Units Subcutaneous QHS   levETIRAcetam  2,500 mg Per Tube BID   loratadine  10 mg Per Tube Daily   mouth rinse  15 mL Mouth Rinse QID   montelukast  10 mg Per Tube Daily   revefenacin  175 mcg Nebulization Daily   tiZANidine  2 mg Per Tube QHS   valproic acid  1,000 mg Per Tube Q12H   Continuous Infusions:  sodium chloride Stopped (01/01/21 2232)   feeding supplement (JEVITY 1.5 CAL/FIBER) 1,000 mL (01/26/21 1352)     LOS: 117 days    Time spent: 25 minutes  Phillips Climes, MD [facility] 01/26/2021, 4:25 PM     Please page though AMION or Epic secure chat:  For [employer], Adult nurse

## 2021-01-26 NOTE — Progress Notes (Signed)
NAME:  Sonia Drake, MRN:  [id number], DOB:  10-01-1955, LOS: 117 ADMISSION DATE:  09/28/2020, CONSULTATION DATE:  10/04/2020 REFERRING MD:  Thedore Mins, CHIEF COMPLAINT:  Respiratory Failure   History of Present Illness:  Patient is a 65 years old female with history of asthma/COPD, congestive heart failure, cardiac arrest with PEA, chronic respiratory failure on permanent trach at 6 L/min, type 2 diabetes mellitus, GERD, seizures and severe hypoxic ischemic encephalopathy presented to the hospital with respiratory distress from skilled nursing facility.  Patient was noted to have aspiration pneumonitis and was put on mechanical ventilation.  She also underwent bronchoscopy on 6/16 which showed collapse of her trachea so tracheostomy was subsequently changed.  Currently on ventilator support through tracheostomy.  Patient has poor overall prognosis due to seizure-like activity during hospitalization.   Family wishes to give patient a full 90 days from date of admission to see if there is any improvement.  This time frame was based on patient's advanced directive.  Pertinent  Medical History   Past Medical History:  Diagnosis Date   Arthritis    "right knee; left hip" (04/07/2017)   Asthma    CHF (congestive heart failure) (HCC)    reported treatment for fluid overload in the setting of asthma exacerbation ~ 1998   COPD (chronic obstructive pulmonary disease) (HCC)    patient states she does not have COPD but was treated for it.    Depression    "from chemical imbalance" (04/07/2017)   Dyspnea    Dysrhythmia    occasional skipped beats   GERD (gastroesophageal reflux disease)    On home oxygen therapy    "I sleep with 2L" (04/07/2017)   Pneumonia    "several times" (04/07/2017)   Seizures (HCC) 2003 X 1   unknown cause     Significant Hospital Events: Including procedures, antibiotic start and stop dates in addition to other pertinent events   6/12 admitted from skilled nursing facility  after aspiration event and copious secretions per tracheostomy 6/13 appears comfortable currently on 35% ATC. No distress. WBC ct trending down. No events overnight growing GPC clusters in two of two cultures  6/16 increased work of breathing, upper airway noise, abdominal muscle use.  Bronchoscopy performed as trach exchange cuffless #8 Shiley XLT showed dynamic airway collapse, tracheal collapse. 6/16 continued respiratory distress moved to ICU, changed to cuffed #6 Shiley XLT, placed on MV with some improved comfort 6/12 cefepime > 6/14, 6/18 6/12 vanc > 6/14, 6/18 6/23 mucus plugging, severe vent dyssynchrony 6/24-25 intermittent jerking, seizures on EEG 6/27 weaned off propofol gtt 6/28 briefly tolerated trach collar in AM 6/29 tolerated trach collar in AM; no further seizure like activity; working with CM/SW for vent-SNF 7/1 Weaning on CPAP/ PS 10/5, CM SW working on [employer], NO seizure activity, secretions are not an issue per nursing 7/11 trach changed to #8 Bivona with air cuff, much improved. 7/12 TCT 12 hours; rested on Vent overnight 7/13 on TCT 7/23 Trach collar tolerated 7/24 Full vent support 7/26>> Remains on full vent support this am, no issues with secretions per nursing 8/5 occluding trach with dynamic collapse: seen on bronchoscopy 8/6 called back emergently to bedside for tracheostomy occlusion: emergent bronch, bivona trach migrated back 2cm, improved with advancing to 2cm above carina 8/8 collapse caused hypoxia. Improved with repositioning  8/9 afternoon had another episode of tracheal collapse and occlusion of trach requiring BVM, sedation. Family produced paperwork which seems to have conflicting information about pts  wishes  8/11: pt continues to have multiple daily episodes of tracheal collapse/mucous plugging req lavage, BVM. 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  Neuro consulted 8/30 overnight , BP drop which responded to fluid bolus 9/2 Continues to have labile BP, now midodrine prn 9/14 Appreciate ethics review of advance directives. Support ethics recommendation regarding patient autonomy  by following her wishes as she outlined.   Interim History / Subjective:   No improvement in exam  Objective   Blood pressure (!) 78/65, pulse 94, temperature 98 F (36.7 C), temperature source Axillary, resp. rate 19, height 5\' 2"  (1.575 m), weight 90.7 kg, SpO2 100 %.    Vent Mode: PRVC FiO2 (%):  [30 %] 30 % Set Rate:  [16 bmp] 16 bmp Vt Set:  [400 mL] 400 mL PEEP:  [5 cmH20] 5 cmH20 Plateau Pressure:  [16 cmH20-18 cmH20] 18 cmH20   Intake/Output Summary (Last 24 hours) at 01/26/2021 1308 Last data filed at 01/26/2021 0800 Gross per 24 hour  Intake 1385 ml  Output 825 ml  Net 560 ml   Filed Weights   01/24/21 0353 01/25/21 0423 01/26/21 0500  Weight: 89.1 kg 90.8 kg 90.7 kg    Examination: General:  In bed on vent HENT: NCAT tracheostomy in place PULM: CTA B, vent supported breathing CV: RRR, no mgr GI: BS+, soft, nontender MSK: diminished bulk and tone Neuro: eye opening but no response to voice or light tought, wince to pain    Assessment & Plan:  Anoxic encephalopathy / anoxic brain injury w/ PVS Acute on chronic respiratory failure w hypercarbia and hypoxia  Ventilator dependence  Severe tracheobronchomalacia Tracheostomy dependent Hx COPD  Dynamic airway collapse  Discussion: No change in neurologic or respiratory exam or condition.  She has severe anoxic brain injury.   Her situation has been reviewed extensively by our ethic committee, multiple clinical leaders and reviewed by multiple service lines.  There is no medical benefit to ongoing aggressive medical care.     Plan Plan withdrawal of care following the hospital futility policy on 10/8  12/8, MD [place] PCCM Pager: [phone] Cell:  [phone] After 7:00 pm call Elink  [phone]

## 2021-01-26 NOTE — Progress Notes (Signed)
[facility] PCCM, System Wide Director, Critical Care Medicine  Lengthy conversation with the patient's primary attending this afternoon, with the director of [facility], director of palliative medicine, and CMO of [facility].  I am very familiar with Sonia Drake case and have participated in many meetings regarding her situation including the ethics consultation and plan for compassionate extubation on 10/8 at 1500.  Will transfer Sonia Drake to my service and will plan to withdraw care tomorrow per previously documented plans.  Heber [place], MD [place] PCCM Pager: [phone] Cell: [phone] After 7:00 pm call Elink  [phone]

## 2021-01-27 LAB — GLUCOSE, CAPILLARY
Glucose-Capillary: 130 mg/dL — ABNORMAL HIGH (ref 70–99)
Glucose-Capillary: 134 mg/dL — ABNORMAL HIGH (ref 70–99)
Glucose-Capillary: 137 mg/dL — ABNORMAL HIGH (ref 70–99)

## 2021-01-27 MED ORDER — DEXTROSE 5 % IV SOLN: INTRAVENOUS | Status: DC

## 2021-01-27 MED ORDER — DIPHENHYDRAMINE HCL 50 MG/ML IJ SOLN
25.0000 mg | INTRAMUSCULAR | Status: DC | PRN
  Administered 2021-01-27: 25 mg via INTRAVENOUS
  Filled 2021-01-27: qty 1

## 2021-01-27 MED ORDER — GLYCOPYRROLATE 0.2 MG/ML IJ SOLN
0.2000 mg | INTRAMUSCULAR | Status: DC | PRN
  Administered 2021-01-27 – 2021-01-28 (×3): 0.2 mg via INTRAVENOUS
  Filled 2021-01-27 (×2): qty 1

## 2021-01-27 MED ORDER — POLYVINYL ALCOHOL 1.4 % OP SOLN
1.0000 [drp] | Freq: Four times a day (QID) | OPHTHALMIC | Status: DC | PRN
  Filled 2021-01-27: qty 15

## 2021-01-27 MED ORDER — GLYCOPYRROLATE 0.2 MG/ML IJ SOLN
0.2000 mg | INTRAMUSCULAR | Status: DC | PRN
  Filled 2021-01-27: qty 1

## 2021-01-27 MED ORDER — GLYCOPYRROLATE 1 MG PO TABS
1.0000 mg | ORAL_TABLET | ORAL | Status: DC | PRN
  Filled 2021-01-27: qty 1

## 2021-01-27 MED ORDER — ACETAMINOPHEN 650 MG RE SUPP: 650.0000 mg | Freq: Four times a day (QID) | RECTAL | Status: DC | PRN

## 2021-01-27 MED ORDER — MORPHINE BOLUS VIA INFUSION
5.0000 mg | INTRAVENOUS | Status: DC | PRN
  Filled 2021-01-27: qty 5

## 2021-01-27 MED ORDER — MORPHINE SULFATE (PF) 2 MG/ML IV SOLN: 2.0000 mg | INTRAVENOUS | Status: DC | PRN

## 2021-01-27 MED ORDER — ACETAMINOPHEN 325 MG PO TABS: 650.0000 mg | ORAL_TABLET | Freq: Four times a day (QID) | ORAL | Status: DC | PRN

## 2021-01-27 MED ORDER — MORPHINE 100MG IN NS 100ML (1MG/ML) PREMIX INFUSION
0.0000 mg/h | INTRAVENOUS | Status: DC
  Administered 2021-01-27: 5 mg/h via INTRAVENOUS
  Administered 2021-01-27 – 2021-01-28 (×3): 15 mg/h via INTRAVENOUS
  Filled 2021-01-27 (×5): qty 100

## 2021-01-27 NOTE — Progress Notes (Signed)
Called by the family/ daughter and asking this Clinical research associate why did I increase the dose of morphine drip  , this writer told the daughter that I received the patient at Morphine going at 40ml/hr and this Clinical research associate have not changed any rate, daughter requested for me to decrease the rate to 15 mg/hr. I explained to her about the rate but she insisted for me to change it to 15.

## 2021-01-27 NOTE — Progress Notes (Signed)
[facility] PCCM  Patient removed from mechanical ventilation Placed on tracheostomy collar oxygen Morphine infusion Resting comfortably Family is bedside Will transfer the patient to palliative medicine floor.  Heber [place], MD [place] PCCM Pager: [phone] Cell: [phone] After 7:00 pm call Elink  [phone]

## 2021-01-27 NOTE — Progress Notes (Signed)
RT terminally removed patient from ventilator. Patient placed on ATC per daughter request. Family, RN, and Dr. Kendrick Fries at bedside. RT will continue to monitor.

## 2021-01-27 NOTE — Progress Notes (Signed)
Palliative Care Progress Note  65 yo with profound anoxic brain injury following PEA cardiac arrest in August of 2021. She has been in a persistent vegetative state since that time. She is completely dependent on artificial means of life support and her body is unable to live outside of an acute care setting. Palliative care has been re-consulted for evaluation and assessment under the guidelines established in the [facility] Futility Policy.  It is my professional opinion and with a high degree of medical certainty that Sonia Drake has a condition that is irreversible and will not regain the ability to breathe on her own, eat, think, interact or communicate. All reasonable and appropriate medical interventions have been  done according to accepted practice and standards of care.  I support placing a DNR order and removal of artificial life support under the [facility] Futility Policy. Patient has a directive in her own writing that requests 30-90 days to "see if I will pull through" and for her family to consult with medical team. She has been in this condition without any improvement since 11/2019 and to proceed with ongoing artificial life support would be in direct violation of her directive. Additionally her directive suggests that her awareness was important to her and she has been without that since her arrest over a year ago.  Plan for RMV 10/8 @3PM  per CCM and provide any and all means necessary to ensure comfort at end of life.  , DO Palliative Medicine  Time:35 min Greater than 50%  of this time was spent counseling and coordinating care related to the above assessment and plan.

## 2021-01-27 NOTE — Progress Notes (Signed)
NAME:  Sonia Drake, MRN:  [id number], DOB:  [birth year], LOS: 55 ADMISSION DATE:  10/14/2020, CONSULTATION DATE:  10/04/2020 REFERRING MD:  Candiss Norse, CHIEF COMPLAINT:  Respiratory Failure   History of Present Illness:  Patient is a 65 years old female with history of asthma/COPD, congestive heart failure, cardiac arrest with PEA, chronic respiratory failure on permanent trach at 6 L/min, type 2 diabetes mellitus, GERD, seizures and severe hypoxic ischemic encephalopathy presented to the hospital with respiratory distress from skilled nursing facility.  Patient was noted to have aspiration pneumonitis and was put on mechanical ventilation.  She also underwent bronchoscopy on 6/16 which showed collapse of her trachea so tracheostomy was subsequently changed.  Currently on ventilator support through tracheostomy.  Patient has poor overall prognosis due to seizure-like activity during hospitalization.   Family wishes to give patient a full 90 days from date of admission to see if there is any improvement.  This time frame was based on patient's advanced directive.  Pertinent  Medical History   Past Medical History:  Diagnosis Date   Arthritis    "right knee; left hip" (04/07/2017)   Asthma    CHF (congestive heart failure) ([place])    reported treatment for fluid overload in the setting of asthma exacerbation ~ 1998   COPD (chronic obstructive pulmonary disease) ([place])    patient states she does not have COPD but was treated for it.    Depression    "from chemical imbalance" (04/07/2017)   Dyspnea    Dysrhythmia    occasional skipped beats   GERD (gastroesophageal reflux disease)    On home oxygen therapy    "I sleep with 2L" (04/07/2017)   Pneumonia    "several times" (04/07/2017)   Seizures ([place]) 2003 X 1   unknown cause     Significant Hospital Events: Including procedures, antibiotic start and stop dates in addition to other pertinent events   6/12 admitted from skilled nursing facility  after aspiration event and copious secretions per tracheostomy 6/13 appears comfortable currently on 35% ATC. No distress. WBC ct trending down. No events overnight growing GPC clusters in two of two cultures  6/16 increased work of breathing, upper airway noise, abdominal muscle use.  Bronchoscopy performed as trach exchange cuffless #8 Shiley XLT showed dynamic airway collapse, tracheal collapse. 6/16 continued respiratory distress moved to ICU, changed to cuffed #6 Shiley XLT, placed on MV with some improved comfort 6/12 cefepime > 6/14, 6/18 6/12 vanc > 6/14, 6/18 6/23 mucus plugging, severe vent dyssynchrony 6/24-25 intermittent jerking, seizures on EEG 6/27 weaned off propofol gtt 6/28 briefly tolerated trach collar in AM 6/29 tolerated trach collar in AM; no further seizure like activity; working with CM/SW for vent-SNF 7/1 Weaning on CPAP/ PS 10/5, CM SW working on [employer], NO seizure activity, secretions are not an issue per nursing 7/11 trach changed to #8 Bivona with air cuff, much improved. 7/12 TCT 12 hours; rested on Vent overnight 7/13 on TCT 7/23 Trach collar tolerated 7/24 Full vent support 7/26>> Remains on full vent support this am, no issues with secretions per nursing 8/5 occluding trach with dynamic collapse: seen on bronchoscopy 8/6 called back emergently to bedside for tracheostomy occlusion: emergent bronch, bivona trach migrated back 2cm, improved with advancing to 2cm above carina 8/8 collapse caused hypoxia. Improved with repositioning  8/9 afternoon had another episode of tracheal collapse and occlusion of trach requiring BVM, sedation. Family produced paperwork which seems to have conflicting information about pts  wishes  8/11: pt continues to have multiple daily episodes of tracheal collapse/mucous plugging req lavage, BVM. 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  Neuro consulted 8/30 overnight , BP drop which responded to fluid bolus 9/2 Continues to have labile BP, now midodrine prn 9/14 Appreciate ethics review of advance directives. Support ethics recommendation regarding patient autonomy  by following her wishes as she outlined.  10/4 family meeting with nursing, hospital medical and administrative leadership: informed family that after review of the patient's condition with the ethics committee the patient will be removed from mechanical ventilatory support on 10/8 at 1500.  Interim History / Subjective:   No acute changes  Objective   Blood pressure (!) 145/90, pulse 96, temperature 97.9 F (36.6 C), temperature source Axillary, resp. rate 16, height _0  (1.575 m), weight 90.5 kg, SpO2 100 %.    Vent Mode: PRVC FiO2 (%):  [30 %] 30 % Set Rate:  [16 bmp] 16 bmp Vt Set:  [400 mL] 400 mL PEEP:  [5 cmH20] 5 cmH20 Plateau Pressure:  [15 cmH20-22 cmH20] 15 cmH20   Intake/Output Summary (Last 24 hours) at 01/27/2021 1127 Last data filed at 01/27/2021 0804 Gross per 24 hour  Intake 1355 ml  Output 1900 ml  Net -545 ml   Filed Weights   01/25/21 0423 01/26/21 0500 01/27/21 0428  Weight: 90.8 kg 90.7 kg 90.5 kg    Examination:  General:  In bed on vent HENT: NCAT tracheostomy in place PULM: Wheezing bilaterally B, vent supported breathing CV: RRR, no mgr GI: BS+, soft, nontender MSK: normal bulk and tone Neuro: eyes open, no motor movement or response to voice or touch     Assessment & Plan:  Anoxic encephalopathy / anoxic brain injury w/ PVS Acute on chronic respiratory failure w hypercarbia and hypoxia  Ventilator dependence  Severe tracheobronchomalacia Tracheostomy dependent Hx COPD  Dynamic airway collapse  Discussion: There has been no change or improvement in her neurologic condition since 11/2019 and in 2022 her respiratory failure has worsened to the point that she is completely dependent on mechanical ventilation.   Per her advanced directive she has not improved after 30-90 days of full medical support so ongoing mechanical ventilation is in direct conflict with her wishes.  Further, she has a condition which will not improve in that her mental status cannot improve and her respiratory status will not improve.    Plan: Remove mechanical ventilator support today at 1500 per outlined plan Given multiple prior near arrest episodes in the past related to airway obstruction from  tracheomalacia and advanced COPD (I have cared for her during two of these episodes), will initiate morphine infusion prior to admission to ensure her comfort and to prevent an abrupt coughing/airway obstruction episode similar to what she has experienced in the past.     I met with her niece Jonelle Sidle at bedside today reviewing the plan for today.  Jonelle Sidle expressed her displeasure with the plan and various conversations she has had in the past.  I let her know that we all understand the pain her family is going through with this event and we too wish we could have had a better outcome.  However, the plan that is in place has been made after weeks of meetings from multiple care team members and committees and has been clearly outlined to the family.  We will plan to withdraw care  today at 1500.  Jonelle Sidle did express thankfulness for my willingness to listen to her.  Roselie Awkward, MD [place] PCCM Pager: [phone] Cell: [phone] After 7:00 pm call Elink  [phone]

## 2021-01-27 NOTE — Progress Notes (Signed)
I was paged to minister to this family who's loved one was taken off of breathing machine. When they took her off pt continue to breath and the family asked for oxygen to be supplied. They wanted to sing pt's favorite song. I was able to lead in that singing. This brought great comfort to the family.  The chaplain offered caring and supportive presence. He encouraged them to say what they need to pt. Please call pastoral care when pt changes.

## 2021-01-27 NOTE — Progress Notes (Signed)
Received patient from ICU with ongoing morphine drip at 20 ml/hr. Daughter at bedside. Transferred patient to regular bed. T-collar set up in place. Oriented daughter with the unit. Call light within reach. Kept patient comfortably in bed.

## 2021-01-28 DIAGNOSIS — J9611 Chronic respiratory failure with hypoxia: Secondary | ICD-10-CM

## 2021-01-28 MED ORDER — LORAZEPAM 2 MG/ML IJ SOLN
INTRAMUSCULAR | Status: AC
  Administered 2021-01-28: 2 mg
  Filled 2021-01-28: qty 1

## 2021-01-28 MED ORDER — CLONAZEPAM 0.5 MG PO TBDP
2.0000 mg | ORAL_TABLET | Freq: Three times a day (TID) | ORAL | Status: DC
  Administered 2021-01-28: 2 mg
  Filled 2021-01-28: qty 8
  Filled 2021-01-28: qty 4

## 2021-01-28 MED ORDER — LORAZEPAM 2 MG/ML IJ SOLN
2.0000 mg | Freq: Once | INTRAMUSCULAR | Status: DC
  Filled 2021-01-28: qty 1

## 2021-01-28 MED ORDER — LORAZEPAM 2 MG/ML IJ SOLN
2.0000 mg | INTRAMUSCULAR | Status: DC | PRN
  Administered 2021-01-28: 2 mg via INTRAVENOUS

## 2021-01-28 MED ORDER — ALBUTEROL SULFATE (2.5 MG/3ML) 0.083% IN NEBU
2.5000 mg | INHALATION_SOLUTION | Freq: Four times a day (QID) | RESPIRATORY_TRACT | Status: DC
  Administered 2021-01-28: 2.5 mg via RESPIRATORY_TRACT
  Filled 2021-01-28 (×2): qty 3

## 2021-01-28 MED ORDER — CLONAZEPAM 0.1 MG/ML ORAL SUSPENSION: 2.0000 mg | Freq: Three times a day (TID) | ORAL | Status: DC

## 2021-01-28 MED ORDER — MORPHINE SULFATE (PF) 4 MG/ML IV SOLN
4.0000 mg | Freq: Once | INTRAVENOUS | Status: AC
Start: 2021-01-28 — End: 2021-01-28
  Administered 2021-01-28: 4 mg via INTRAVENOUS
  Filled 2021-01-28: qty 1

## 2021-01-29 ENCOUNTER — Telehealth: Payer: Self-pay | Admitting: Pulmonary Disease

## 2021-01-29 NOTE — Telephone Encounter (Signed)
Pt daughter [name] calling because she was told to reach out to Dr Kendrick Fries about mother's death certificate. [name] wanting to know what the death certificate is going to say. [name] wanting the death certificate to say "compassionate care, died of natural causes". Please advise [phone]

## 2021-01-30 ENCOUNTER — Telehealth: Payer: Self-pay | Admitting: Pulmonary Disease

## 2021-01-30 NOTE — Telephone Encounter (Signed)
I have called and LM on VM for the pts daughter, [name] to call us back

## 2021-01-30 NOTE — Telephone Encounter (Signed)
Pt daughter calling back checking on death certificate-it looks like Dr Kendrick Fries responded back to my message. "You can let them know that I will complete the death certificate and it will indicate that she died of natural causes. The cause of death will be chronic respiratory failure with hypoxemia due to chronic obstructive asthma, severe tracheobronchomalacia and anoxic brain injury" Please advise [phone]

## 2021-01-30 NOTE — Telephone Encounter (Signed)
Patient's daughter, [name], called back to find out what Dr. Kendrick Fries would be putting on the death certificate as cause of death.  I explained that we have not received the death certificate through the [facility] system and that she needs to reach out to the funeral home to get it sent to [name].  I also explained that Dr. Kendrick Fries may not be able to list "natural causes" and "compassionate care" on the death certificate, but that we will pass the request to him at the time we get the request in [facility].   I gave her my direct number to call back with any questions.

## 2021-01-30 NOTE — Telephone Encounter (Signed)
I have spoken with the pts daughter and she stated that she was told by another provider that on the death certificate it could be listed as natural causes and compassionate care.  She is not understanding why the other things that BQ listed are going to be on the death certificate.  Pt is requesting that Dr. Kendrick Fries call her when he is able.  Advised the pt that I would send this message over to BQ.

## 2021-02-06 NOTE — Telephone Encounter (Signed)
See encounter from 01/31/21. Will close this encounter.

## 2021-02-20 NOTE — Progress Notes (Signed)
[facility] PCCM  S: resting comfortably, will have periods of seizure activity  O: Vitals:   01/27/21 1130 01/27/21 1136 02/19/2021 0515 01/20/2021 1139  BP:  (!) 133/92 (!) 73/48 (!) 59/50  Pulse: (!) 102 (!) 105 (!) 106 100  Resp: (!) 21 20 17  (!) 5  Temp:   97.8 F (36.6 C) 98.2 F (36.8 C)  TempSrc:   Oral Oral  SpO2: 100%  (!) 71% (!) 57%  Weight:      Height:       FiO2 28%  General:  Resting comfortably in bed, intermittent seizure activity HENT: NCAT OP clear tracheostomy intact PULM: No wheezing B, infrequent, weak respirations CV: distant heart sounds GI: no breath sounds MSK: diminished bulk/tone Neuro: occasional eye opening, no motor response to touch, voice  Impression: Active Problems:   Asthma   COPD (chronic obstructive pulmonary disease) (HCC)   GERD (gastroesophageal reflux disease)   Seizures (HCC)   Type 2 diabetes mellitus without complication, with long-term current use of insulin (HCC)   Myoclonus   Severe hypoxic-ischemic encephalopathy   Tracheostomy dependence (HCC)   Pressure injury of skin   Ventilator dependence (HCC)   Acquired tracheal collapse   Anoxic brain injury (HCC)   Essential hypertension   Chronic respiratory failure with hypoxia (HCC)  Discussion: Ms. is actively dying. She is having frequent seizures now in the setting of severe hypotension and very infrequent breaths.    Plan: Continue full comfort measures Add clonazepam to decrease seizure activity Continue morphine infusion at 15mg /hr Continue prn morphine Ativan 2mg  IV prn seizure Add scheduled albuterol per family request: she has used this medicine for years Code status DNR  I updated her sister Lelon Perla at and [place] by phone.  feels that it would be better if her sisters passed away as she does not want her to suffer any more.  I assured her we will continue to provide comfort measures to keep her from feeling dyspnea or pain. She was grateful for  our care.  > 35 minutes spent in this visit  Kathie Rhodes, MD [place] PCCM Pager: [phone] Cell: [phone] After 7:00 pm call Elink  [phone]

## 2021-02-20 NOTE — Progress Notes (Addendum)
Comfort care in progress, morphine drip at 15 mg/hr RPIV, pt rr irregular and unlabored, pt skin w/d pt current GCS. Pt daughter and family at bedside with concerns that pt wishes are not being honored by medical staff, pts daughter states pt "would not have wanted to be off the breathing machine and would not have wanted any morphine", pts family members concerned that is not receiving any nutrition. Pt daughter [name] desires pt to be on a continuous monitor, daughter desires morphine drip to be decreased to allow for true assessment of responsiveness. This rn explained use of pallative care, end of life issues and medical nursing measures with one of the main goals being to provide comfort to pt and support pt and family. This rn informed [place] and family md will be notified of their concerns. After an hour discussion with pts daughter [name] and family memers, [name] in agreement with holding off on this rn decreasing rate of pts morphine drip until she speaks with md.

## 2021-02-20 NOTE — Progress Notes (Signed)
This rn informed pt daughter [name] md will come to speak with her.

## 2021-02-20 NOTE — Progress Notes (Signed)
Pt expired prior to shift change at 1810. Per families request assisted them in post-mortem care.Allowed family time to visit with pt. Daughter wanted to make sure ts cause of death is listed as [facility] natural causes. Family was very adamant about not wanting PT to go to the morgue. Charge RN spoke to family about their request to have funeral home pick up the PT.

## 2021-02-20 NOTE — Progress Notes (Signed)
CH visited briefly to follow up from earlier visit from [facility] this evening; pt. lying in bed asleep w/dtr. in chair at bedside; dtr. shared no support needs but requested [facility] ask staff to come suction pt.  Message relayed.  Chaplains remain available as needed.  Elpidio Anis, Chaplain Pager: [phone]

## 2021-02-20 NOTE — Progress Notes (Signed)
This rn arrived to bedside to administer medication. Pt daughter [place] and visitor at bedside, [place] holding pts hand, this rn note pt with no visible chest no palpable pulse no audible breath sounds or heart sounds. This rn informed pt daughter of changes. Confirmation of no palpable pulse, visible chest rise no audible heart/breath sounds confirmed by nurse ben. Dr Kendrick Fries notified by nurse ben via secure chat [phone]

## 2021-02-20 NOTE — Death Summary Note (Signed)
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 chronic obstructive asthma Secondary Diagnoses (including complications and co-morbidities):  Active Problems:   Asthma   COPD (chronic obstructive pulmonary disease) (HCC)   GERD (gastroesophageal reflux disease)   Seizures (HCC)   Type 2 diabetes mellitus without complication, with long-term current use of insulin (HCC)   Myoclonus   Severe hypoxic-ischemic encephalopathy   Tracheostomy dependence (HCC)   Pressure injury of skin   Ventilator dependence (HCC)   Acquired tracheal collapse   Anoxic brain injury ([place])   Essential hypertension   Chronic respiratory failure with hypoxia [facility])   Brief Hospital Course (including significant findings, care, treatment, and services provided and events leading to death)  Patient is a 65 years old female with history of asthma/COPD, congestive heart failure, cardiac arrest with PEA, chronic respiratory failure on permanent trach at 6 L/min, type 2 diabetes mellitus, GERD, seizures and severe hypoxic ischemic encephalopathy presented to the hospital with respiratory distress from skilled nursing facility.  Patient was noted to have aspiration pneumonitis and was put on mechanical ventilation.  She also underwent bronchoscopy on 6/16 which showed collapse of her trachea so tracheostomy was subsequently changed.  Currently on ventilator support through tracheostomy.  Patient has poor overall prognosis due to seizure-like activity during hospitalization.   Family wishes to give patient a full 90 days from  date of admission to see if there is any improvement.  This time frame was based on patient's advanced directive. She had a very long hospitalization as summarized below:  2022/11/01 admitted from skilled nursing facility after aspiration event and copious secretions per tracheostomy 6/13 appears comfortable currently on 35% ATC. No distress. WBC ct trending down. No events overnight growing GPC clusters in two of two cultures  6/16 increased work of breathing, upper airway noise, abdominal muscle use.  Bronchoscopy performed as trach exchange cuffless #8 Shiley XLT showed dynamic airway collapse, tracheal collapse. 6/16 continued respiratory distress moved to ICU, changed to cuffed #6 Shiley XLT, placed on MV with some improved comfort 11/01/2022 cefepime > 6/14, 6/18 November 01, 2022 vanc > 6/14, 6/18 6/23 mucus plugging, severe vent dyssynchrony 6/24-25 intermittent jerking, seizures on EEG 6/27 weaned off propofol gtt 6/28 briefly tolerated trach collar in AM 6/29 tolerated trach collar in AM; no further seizure like activity; working with CM/SW for vent-SNF 7/1 Weaning on CPAP/ PS 10/5, CM SW working on [employer], NO seizure activity, secretions are not an issue per nursing 7/11 trach changed to #8 Bivona with air cuff, much improved. 7/12 TCT 12 hours; rested on Vent overnight 7/13 on TCT 7/23 Trach collar tolerated 7/24 Full vent support 7/26>> Remains on full vent support this am, no issues with secretions per nursing 8/5 occluding trach with dynamic collapse: seen on bronchoscopy 8/6 called back emergently to bedside for tracheostomy occlusion: emergent bronch, bivona trach migrated back 2cm, improved with advancing to 2cm above carina 8/8 collapse caused hypoxia. Improved with repositioning  8/9 afternoon had another episode of tracheal collapse and occlusion of trach requiring BVM, sedation. Family produced paperwork which seems to have conflicting information about  pts wishes  8/11: pt continues to have  multiple daily episodes of tracheal collapse/mucous plugging req lavage, BVM. 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 Neuro consulted 8/30 overnight , BP drop which responded to fluid bolus 9/2 Continues to have labile BP, now midodrine prn 9/14 Appreciate ethics review of advance directives. Support ethics recommendation regarding patient autonomy  by following her wishes as she outlined.  10/4 family meeting with nursing, hospital medical and administrative leadership: informed family that after review of the patient's condition with the ethics committee the patient will be removed from mechanical ventilatory support on 10/8 at 1500.  The patient had a hand written advanced directive that indicated that she did not want prolonged aggressive medical support if she showed no signs of improvement after 90 days of care.  She had anoxic brain injury after cardiac arrest in 2021 and was re-admitted in 2022 for worsening respiratory failure.  During this hospitalization she showed no signs of neurologic improvement and her respiratory status did not improve and she required full mechanical ventilator support.  As the patient was dependent on life support with severe anoxic brain injury, we discussed withdrawal of care with the patient's daughter but she was not willing to proceed with this.  Multiple medical services who saw the patient felt that the  care provided in the ICU was medically non-beneficial as the patient's condition was not improving and was in direct violation of her advanced directive.  We consulted palliative medicine and the ethics committee.  The ethics committee provided insight in the [place] which was reviewed with multiple service lines and [place].  We met with the patient's family and informed them that based on the [place] medical futility  policy we planned to withdrawal mechanical ventilator support, medications, and nutrition on 10/8 at 1500.  The patient's family was given the opportunity to transfer the patient but no facility accepted the patient in transfer.  Mechanical ventilator support was withheld on 10/8 per our plan with family present.  Morphine was administered for comfort.  The patient died on 17-Feb-2023 in the evening with family at the bedside.   Pertinent Labs and Studies  Significant Diagnostic Studies DG CHEST PORT 1 VIEW  Result Date: 01/11/2021 CLINICAL DATA:  Tracheostomy. EXAM: PORTABLE CHEST 1 VIEW COMPARISON:  Chest radiograph dated 12/23/2020. FINDINGS: Similar positioning of the tracheostomy. Reticular densities at the left lung base may represent atelectasis. Developing infiltrate is not excluded clinical correlation is recommended. No focal consolidation, pleural effusion, or pneumothorax the cardiac silhouette is within normal limits. No acute osseous pathology. IMPRESSION: 1. Similar positioning of the tracheostomy. 2. Left lung base atelectasis versus developing infiltrate. Electronically Signed   By: Anner Crete M.D.   On: 01/11/2021 19:29    Microbiology Recent Results (from the past 240 hour(s))  MRSA Next Gen by PCR, Nasal     Status: None   Collection Time: 01/21/21  3:42 AM   Specimen: Nasal Mucosa; Nasal Swab  Result Value Ref Range Status   MRSA by PCR Next Gen NOT DETECTED NOT DETECTED Final    Comment: (NOTE) The GeneXpert MRSA Assay (FDA approved for NASAL specimens only), is one component of a comprehensive MRSA colonization surveillance program. It is not intended to diagnose MRSA infection nor to guide or monitor treatment for MRSA infections. Test performance is not FDA approved in patients less than  70 years old. Performed at [facility], [place] [address] [place], [place] [address]     Lab Basic Metabolic Panel: No results for input(s): NA, K, CL, CO2, GLUCOSE, BUN,  CREATININE, CALCIUM, MG, PHOS in the last 168 hours. Liver Function Tests: No results for input(s): AST, ALT, ALKPHOS, BILITOT, PROT, ALBUMIN in the last 168 hours. No results for input(s): LIPASE, AMYLASE in the last 168 hours. No results for input(s): AMMONIA in the last 168 hours. CBC: No results for input(s): WBC, NEUTROABS, HGB, HCT, MCV, PLT in the last 168 hours. Cardiac Enzymes: No results for input(s): CKTOTAL, CKMB, CKMBINDEX, TROPONINI in the last 168 hours. Sepsis Labs: No results for input(s): PROCALCITON, WBC, LATICACIDVEN in the last 168 hours.  Procedures/Operations  bronchoscopies   Roselie Awkward 01/29/2021, 8:06 AM

## 2021-02-20 NOTE — Progress Notes (Signed)
This rn arrived to room to find Dr Kendrick Fries speaking to pts sister. Md informed this rn pts sister reports notable seizure like activity. Will give ativan 2mg  ivp per md order. Pts sister called pts daughter and md spoke to daughter by phone.

## 2021-02-20 DEATH — deceased

## 2022-06-22 IMAGING — DX DG CHEST 1V PORT
1 series · 1 of 1 positions shown · non-contrast
Comparison: 12/07/2020

CLINICAL DATA: Respiratory failure and ARDS

EXAM:
PORTABLE CHEST 1 VIEW

[chest ap]
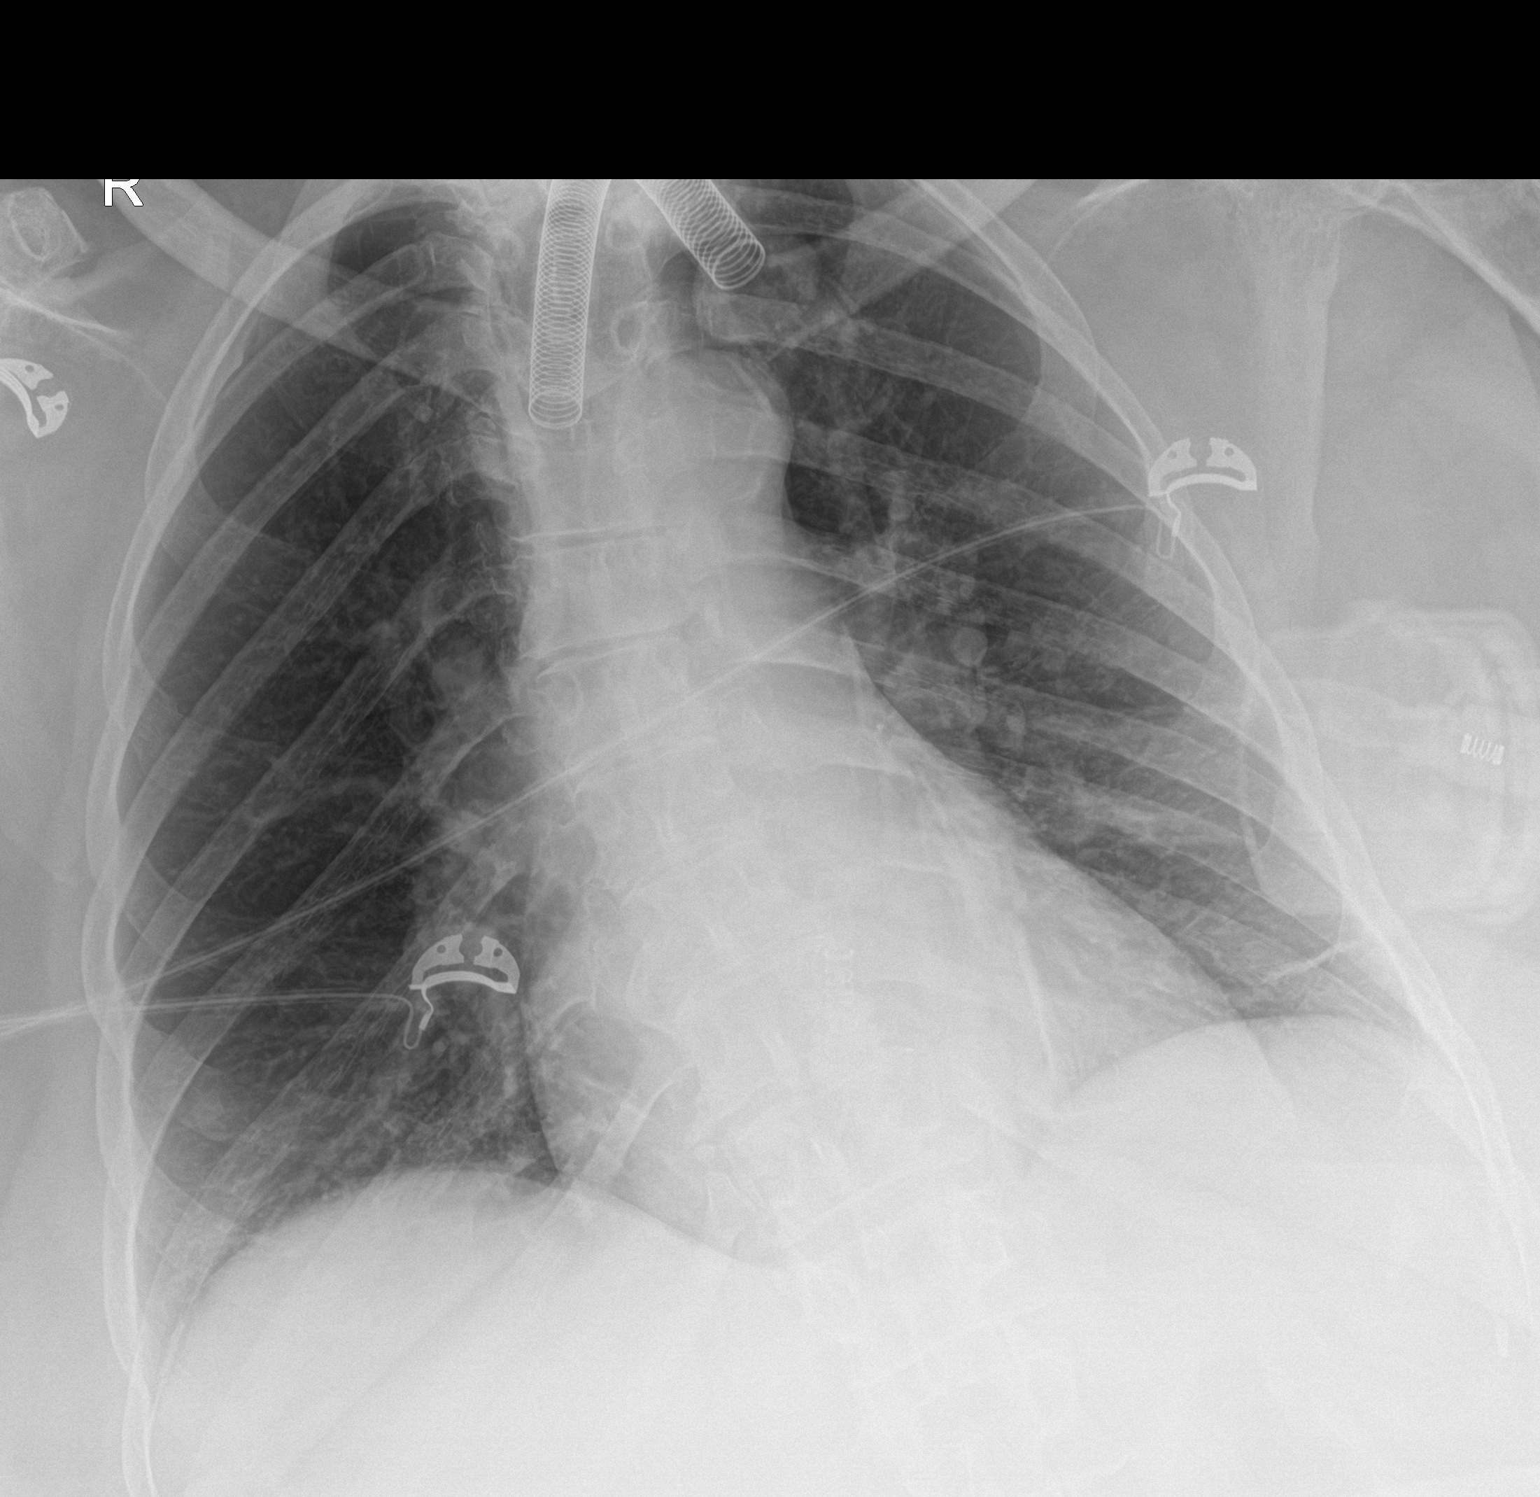

[1 of 1 positions shown; findings below may reference images not displayed]

FINDINGS: Retrocardiac chronic linear opacity. Normal heart size and
mediastinal contours. Tracheostomy tube in expected position. No
acute osseous finding.
IMPRESSION: Mild atelectasis or scarring. No acute finding when compared to
priors.
